# Patient Record
Sex: Female | Born: 1965 | Race: White | Hispanic: No | Smoking: Current every day smoker
Health system: Southern US, Community
[De-identification: ages and names within clinical notes are randomized; demographics above are authoritative.]

## PROBLEM LIST (undated history)

## (undated) DIAGNOSIS — G4733 Obstructive sleep apnea (adult) (pediatric): Secondary | ICD-10-CM

## (undated) DIAGNOSIS — K760 Fatty (change of) liver, not elsewhere classified: Secondary | ICD-10-CM

## (undated) DIAGNOSIS — M1990 Unspecified osteoarthritis, unspecified site: Secondary | ICD-10-CM

## (undated) DIAGNOSIS — I10 Essential (primary) hypertension: Secondary | ICD-10-CM

## (undated) DIAGNOSIS — I509 Heart failure, unspecified: Secondary | ICD-10-CM

## (undated) DIAGNOSIS — E119 Type 2 diabetes mellitus without complications: Secondary | ICD-10-CM

## (undated) DIAGNOSIS — K859 Acute pancreatitis without necrosis or infection, unspecified: Secondary | ICD-10-CM

## (undated) DIAGNOSIS — I5030 Unspecified diastolic (congestive) heart failure: Secondary | ICD-10-CM

## (undated) DIAGNOSIS — K259 Gastric ulcer, unspecified as acute or chronic, without hemorrhage or perforation: Secondary | ICD-10-CM

## (undated) DIAGNOSIS — K8020 Calculus of gallbladder without cholecystitis without obstruction: Secondary | ICD-10-CM

## (undated) DIAGNOSIS — J45909 Unspecified asthma, uncomplicated: Secondary | ICD-10-CM

## (undated) DIAGNOSIS — S4990XA Unspecified injury of shoulder and upper arm, unspecified arm, initial encounter: Secondary | ICD-10-CM

## (undated) DIAGNOSIS — G510 Bell's palsy: Secondary | ICD-10-CM

## (undated) DIAGNOSIS — E785 Hyperlipidemia, unspecified: Secondary | ICD-10-CM

## (undated) DIAGNOSIS — J449 Chronic obstructive pulmonary disease, unspecified: Secondary | ICD-10-CM

## (undated) DIAGNOSIS — F17200 Nicotine dependence, unspecified, uncomplicated: Secondary | ICD-10-CM

## (undated) DIAGNOSIS — F419 Anxiety disorder, unspecified: Secondary | ICD-10-CM

## (undated) DIAGNOSIS — R0600 Dyspnea, unspecified: Secondary | ICD-10-CM

## (undated) DIAGNOSIS — J40 Bronchitis, not specified as acute or chronic: Secondary | ICD-10-CM

## (undated) DIAGNOSIS — F32A Depression, unspecified: Secondary | ICD-10-CM

## (undated) HISTORY — PX: KNEE SURGERY: SHX244

## (undated) HISTORY — DX: Heart failure, unspecified: I50.9

## (undated) HISTORY — DX: Bell's palsy: G51.0

## (undated) HISTORY — DX: Hyperlipidemia, unspecified: E78.5

## (undated) HISTORY — DX: Type 2 diabetes mellitus without complications: E11.9

## (undated) HISTORY — DX: Acute pancreatitis without necrosis or infection, unspecified: K85.90

## (undated) HISTORY — DX: Unspecified injury of shoulder and upper arm, unspecified arm, initial encounter: S49.90XA

## (undated) HISTORY — DX: Unspecified diastolic (congestive) heart failure: I50.30

## (undated) HISTORY — DX: Obstructive sleep apnea (adult) (pediatric): G47.33

## (undated) HISTORY — DX: Bronchitis, not specified as acute or chronic: J40

---
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
## 2004-08-06 ENCOUNTER — Ambulatory Visit: Payer: Self-pay | Admitting: Specialist

## 2004-08-20 ENCOUNTER — Ambulatory Visit: Payer: Self-pay | Admitting: Specialist

## 2007-02-06 ENCOUNTER — Emergency Department: Payer: Self-pay | Admitting: Emergency Medicine

## 2008-10-30 ENCOUNTER — Ambulatory Visit: Payer: Self-pay | Admitting: Internal Medicine

## 2008-11-11 ENCOUNTER — Ambulatory Visit: Payer: Self-pay | Admitting: Rheumatology

## 2008-11-13 ENCOUNTER — Ambulatory Visit: Payer: Self-pay | Admitting: Internal Medicine

## 2008-11-24 ENCOUNTER — Emergency Department: Payer: Self-pay | Admitting: Emergency Medicine

## 2008-12-14 ENCOUNTER — Emergency Department: Payer: Self-pay

## 2009-01-02 ENCOUNTER — Ambulatory Visit: Payer: Self-pay | Admitting: Specialist

## 2009-02-03 ENCOUNTER — Ambulatory Visit: Payer: Self-pay | Admitting: Pain Medicine

## 2009-02-11 ENCOUNTER — Ambulatory Visit: Payer: Self-pay | Admitting: Pain Medicine

## 2009-02-26 ENCOUNTER — Ambulatory Visit: Payer: Self-pay | Admitting: Physician Assistant

## 2009-04-10 ENCOUNTER — Ambulatory Visit: Payer: Self-pay | Admitting: Physician Assistant

## 2009-04-22 ENCOUNTER — Ambulatory Visit: Payer: Self-pay | Admitting: Pain Medicine

## 2009-05-13 ENCOUNTER — Ambulatory Visit: Payer: Self-pay | Admitting: Physician Assistant

## 2009-05-22 ENCOUNTER — Ambulatory Visit: Payer: Self-pay | Admitting: Otolaryngology

## 2009-06-10 ENCOUNTER — Ambulatory Visit: Payer: Self-pay | Admitting: Physician Assistant

## 2009-06-17 ENCOUNTER — Ambulatory Visit: Payer: Self-pay | Admitting: Pain Medicine

## 2009-07-08 ENCOUNTER — Ambulatory Visit: Payer: Self-pay | Admitting: Physician Assistant

## 2009-08-12 ENCOUNTER — Ambulatory Visit: Payer: Self-pay | Admitting: Pain Medicine

## 2009-09-02 ENCOUNTER — Ambulatory Visit: Payer: Self-pay | Admitting: Pain Medicine

## 2009-10-23 ENCOUNTER — Ambulatory Visit: Payer: Self-pay | Admitting: Pain Medicine

## 2009-11-24 ENCOUNTER — Ambulatory Visit: Payer: Self-pay | Admitting: Pain Medicine

## 2011-04-17 ENCOUNTER — Emergency Department: Payer: Self-pay | Admitting: Internal Medicine

## 2011-09-01 DIAGNOSIS — L719 Rosacea, unspecified: Secondary | ICD-10-CM | POA: Insufficient documentation

## 2012-02-08 ENCOUNTER — Emergency Department: Payer: Self-pay | Admitting: *Deleted

## 2012-07-07 ENCOUNTER — Emergency Department: Payer: Self-pay | Admitting: Emergency Medicine

## 2012-07-07 LAB — COMPREHENSIVE METABOLIC PANEL
Alkaline Phosphatase: 102 U/L (ref 50–136)
Anion Gap: 7 (ref 7–16)
BUN: 6 mg/dL — ABNORMAL LOW (ref 7–18)
Bilirubin,Total: 0.4 mg/dL (ref 0.2–1.0)
Calcium, Total: 8.8 mg/dL (ref 8.5–10.1)
Chloride: 103 mmol/L (ref 98–107)
Co2: 26 mmol/L (ref 21–32)
EGFR (African American): >60
EGFR (Non-African Amer.): >60
Glucose: 125 mg/dL — ABNORMAL HIGH (ref 65–99)
Potassium: 3.8 mmol/L (ref 3.5–5.1)
SGOT(AST): 79 U/L — ABNORMAL HIGH (ref 15–37)
SGPT (ALT): 76 U/L (ref 12–78)
Total Protein: 7.7 g/dL (ref 6.4–8.2)

## 2012-07-07 LAB — CBC
MCH: 34.4 pg — ABNORMAL HIGH (ref 26.0–34.0)
MCHC: 34.5 g/dL (ref 32.0–36.0)
RBC: 4.69 10*6/uL (ref 3.80–5.20)
RDW: 15 % — ABNORMAL HIGH (ref 11.5–14.5)

## 2012-12-19 ENCOUNTER — Emergency Department: Payer: Self-pay | Admitting: Emergency Medicine

## 2012-12-19 LAB — BASIC METABOLIC PANEL
BUN: 7 mg/dL (ref 7–18)
Calcium, Total: 8.8 mg/dL (ref 8.5–10.1)
Co2: 25 mmol/L (ref 21–32)
Creatinine: 0.73 mg/dL (ref 0.60–1.30)
EGFR (African American): >60
Glucose: 128 mg/dL — ABNORMAL HIGH (ref 65–99)
Potassium: 3.6 mmol/L (ref 3.5–5.1)

## 2012-12-19 LAB — CBC
MCH: 35.7 pg — ABNORMAL HIGH (ref 26.0–34.0)
MCHC: 34 g/dL (ref 32.0–36.0)
MCV: 105 fL — ABNORMAL HIGH (ref 80–100)
RBC: 4.17 10*6/uL (ref 3.80–5.20)
RDW: 14.7 % — ABNORMAL HIGH (ref 11.5–14.5)
WBC: 8.7 10*3/uL (ref 3.6–11.0)

## 2012-12-19 LAB — TROPONIN I: Troponin-I: <0.02 ng/mL

## 2013-02-10 ENCOUNTER — Emergency Department: Payer: Self-pay | Admitting: Emergency Medicine

## 2013-02-10 LAB — CK TOTAL AND CKMB (NOT AT ARMC): CK-MB: 1.8 ng/mL (ref 0.5–3.6)

## 2013-02-10 LAB — COMPREHENSIVE METABOLIC PANEL
Alkaline Phosphatase: 84 U/L (ref 50–136)
Anion Gap: 8 (ref 7–16)
Bilirubin,Total: 0.7 mg/dL (ref 0.2–1.0)
Calcium, Total: 8.9 mg/dL (ref 8.5–10.1)
Chloride: 101 mmol/L (ref 98–107)
Co2: 26 mmol/L (ref 21–32)
EGFR (Non-African Amer.): >60
Glucose: 111 mg/dL — ABNORMAL HIGH (ref 65–99)
Potassium: 3.4 mmol/L — ABNORMAL LOW (ref 3.5–5.1)
SGOT(AST): 66 U/L — ABNORMAL HIGH (ref 15–37)
SGPT (ALT): 70 U/L (ref 12–78)
Sodium: 135 mmol/L — ABNORMAL LOW (ref 136–145)
Total Protein: 7 g/dL (ref 6.4–8.2)

## 2013-02-10 LAB — CBC
HCT: 43.4 % (ref 35.0–47.0)
HGB: 14.7 g/dL (ref 12.0–16.0)
MCH: 34.9 pg — ABNORMAL HIGH (ref 26.0–34.0)
MCHC: 33.9 g/dL (ref 32.0–36.0)
Platelet: 285 10*3/uL (ref 150–440)
RBC: 4.22 10*6/uL (ref 3.80–5.20)
WBC: 15.3 10*3/uL — ABNORMAL HIGH (ref 3.6–11.0)

## 2013-02-10 LAB — TROPONIN I: Troponin-I: <0.02 ng/mL

## 2013-02-10 LAB — LIPASE, BLOOD: Lipase: 90 U/L (ref 73–393)

## 2013-02-16 ENCOUNTER — Emergency Department: Payer: Self-pay | Admitting: Unknown Physician Specialty

## 2013-02-16 LAB — COMPREHENSIVE METABOLIC PANEL
Albumin: 3.3 g/dL — ABNORMAL LOW (ref 3.4–5.0)
Bilirubin,Total: 1 mg/dL (ref 0.2–1.0)
Calcium, Total: 8.7 mg/dL (ref 8.5–10.1)
Chloride: 100 mmol/L (ref 98–107)
EGFR (African American): >60
EGFR (Non-African Amer.): >60
Glucose: 109 mg/dL — ABNORMAL HIGH (ref 65–99)
Potassium: 3 mmol/L — ABNORMAL LOW (ref 3.5–5.1)
Sodium: 135 mmol/L — ABNORMAL LOW (ref 136–145)

## 2013-02-16 LAB — CBC
HCT: 42.5 % (ref 35.0–47.0)
MCHC: 35.6 g/dL (ref 32.0–36.0)
Platelet: 278 10*3/uL (ref 150–440)
RBC: 4.18 10*6/uL (ref 3.80–5.20)

## 2013-03-10 ENCOUNTER — Emergency Department: Payer: Self-pay | Admitting: Emergency Medicine

## 2013-03-10 LAB — COMPREHENSIVE METABOLIC PANEL
Bilirubin,Total: 0.7 mg/dL (ref 0.2–1.0)
Chloride: 99 mmol/L (ref 98–107)
Co2: 27 mmol/L (ref 21–32)
Creatinine: 0.75 mg/dL (ref 0.60–1.30)
EGFR (African American): >60
Osmolality: 267 (ref 275–301)
Potassium: 3.4 mmol/L — ABNORMAL LOW (ref 3.5–5.1)
SGOT(AST): 57 U/L — ABNORMAL HIGH (ref 15–37)
SGPT (ALT): 57 U/L (ref 12–78)

## 2013-03-10 LAB — CBC
HCT: 44.2 % (ref 35.0–47.0)
HGB: 15.4 g/dL (ref 12.0–16.0)
MCH: 35.8 pg — ABNORMAL HIGH (ref 26.0–34.0)
MCHC: 34.8 g/dL (ref 32.0–36.0)
MCV: 103 fL — ABNORMAL HIGH (ref 80–100)
WBC: 9.3 10*3/uL (ref 3.6–11.0)

## 2013-03-10 LAB — CK TOTAL AND CKMB (NOT AT ARMC): CK, Total: 56 U/L (ref 21–215)

## 2013-03-10 LAB — TROPONIN I: Troponin-I: <0.02 ng/mL

## 2013-03-23 ENCOUNTER — Emergency Department: Payer: Self-pay | Admitting: Internal Medicine

## 2013-03-23 LAB — CBC
HCT: 43.6 % (ref 35.0–47.0)
HGB: 15.3 g/dL (ref 12.0–16.0)
MCH: 35.6 pg — ABNORMAL HIGH (ref 26.0–34.0)
MCHC: 35 g/dL (ref 32.0–36.0)
MCV: 102 fL — ABNORMAL HIGH (ref 80–100)
Platelet: 258 10*3/uL (ref 150–440)
RBC: 4.29 10*6/uL (ref 3.80–5.20)
RDW: 13.4 % (ref 11.5–14.5)
WBC: 9 10*3/uL (ref 3.6–11.0)

## 2013-03-23 LAB — BASIC METABOLIC PANEL
Anion Gap: 5 — ABNORMAL LOW (ref 7–16)
BUN: 5 mg/dL — ABNORMAL LOW (ref 7–18)
Calcium, Total: 9.2 mg/dL (ref 8.5–10.1)
Chloride: 102 mmol/L (ref 98–107)
Co2: 28 mmol/L (ref 21–32)
Creatinine: 0.52 mg/dL — ABNORMAL LOW (ref 0.60–1.30)
EGFR (African American): >60
EGFR (Non-African Amer.): >60
Glucose: 98 mg/dL (ref 65–99)
Osmolality: 267 (ref 275–301)
Potassium: 3.5 mmol/L (ref 3.5–5.1)
Sodium: 135 mmol/L — ABNORMAL LOW (ref 136–145)

## 2013-03-23 LAB — PRO B NATRIURETIC PEPTIDE: B-Type Natriuretic Peptide: 70 pg/mL (ref 0–125)

## 2014-02-26 ENCOUNTER — Emergency Department: Payer: Self-pay | Admitting: Emergency Medicine

## 2014-02-26 LAB — BASIC METABOLIC PANEL
Anion Gap: 8 (ref 7–16)
BUN: 4 mg/dL — ABNORMAL LOW (ref 7–18)
CHLORIDE: 104 mmol/L (ref 98–107)
CREATININE: 0.74 mg/dL (ref 0.60–1.30)
Calcium, Total: 8.3 mg/dL — ABNORMAL LOW (ref 8.5–10.1)
Co2: 27 mmol/L (ref 21–32)
EGFR (Non-African Amer.): >60
GLUCOSE: 132 mg/dL — AB (ref 65–99)
OSMOLALITY: 276 (ref 275–301)
Potassium: 3.7 mmol/L (ref 3.5–5.1)
SODIUM: 139 mmol/L (ref 136–145)

## 2014-02-26 LAB — URINALYSIS, COMPLETE
BILIRUBIN, UR: NEGATIVE
Bacteria: NONE SEEN
Blood: NEGATIVE
Glucose,UR: NEGATIVE mg/dL (ref 0–75)
Ketone: NEGATIVE
Nitrite: NEGATIVE
PH: 6 (ref 4.5–8.0)
PROTEIN: NEGATIVE
RBC,UR: NONE SEEN /HPF (ref 0–5)
SPECIFIC GRAVITY: 1.014 (ref 1.003–1.030)
Squamous Epithelial: 1
WBC UR: 3 /HPF (ref 0–5)

## 2014-02-26 LAB — TROPONIN I: Troponin-I: <0.02 ng/mL

## 2014-02-26 LAB — CBC
HCT: 45.5 % (ref 35.0–47.0)
HGB: 14.9 g/dL (ref 12.0–16.0)
MCH: 33.3 pg (ref 26.0–34.0)
MCHC: 32.8 g/dL (ref 32.0–36.0)
MCV: 102 fL — ABNORMAL HIGH (ref 80–100)
PLATELETS: 281 10*3/uL (ref 150–440)
RBC: 4.48 10*6/uL (ref 3.80–5.20)
RDW: 14.3 % (ref 11.5–14.5)
WBC: 9.7 10*3/uL (ref 3.6–11.0)

## 2014-03-14 ENCOUNTER — Emergency Department: Payer: Self-pay | Admitting: Emergency Medicine

## 2014-04-17 ENCOUNTER — Ambulatory Visit: Payer: Self-pay | Admitting: Specialist

## 2015-06-13 ENCOUNTER — Inpatient Hospital Stay
Admission: EM | Admit: 2015-06-13 | Discharge: 2015-06-23 | DRG: 643 | Disposition: A | Discharge status: Home / Self Care | Payer: 59 | Attending: Internal Medicine | Admitting: Internal Medicine

## 2015-06-13 ENCOUNTER — Encounter: Payer: Self-pay | Admitting: Emergency Medicine

## 2015-06-13 ENCOUNTER — Emergency Department: Payer: 59

## 2015-06-13 DIAGNOSIS — J440 Chronic obstructive pulmonary disease with acute lower respiratory infection: Secondary | ICD-10-CM | POA: Diagnosis present

## 2015-06-13 DIAGNOSIS — J45909 Unspecified asthma, uncomplicated: Secondary | ICD-10-CM | POA: Diagnosis present

## 2015-06-13 DIAGNOSIS — R0602 Shortness of breath: Secondary | ICD-10-CM | POA: Insufficient documentation

## 2015-06-13 DIAGNOSIS — N289 Disorder of kidney and ureter, unspecified: Secondary | ICD-10-CM | POA: Insufficient documentation

## 2015-06-13 DIAGNOSIS — E878 Other disorders of electrolyte and fluid balance, not elsewhere classified: Secondary | ICD-10-CM | POA: Diagnosis present

## 2015-06-13 DIAGNOSIS — J449 Chronic obstructive pulmonary disease, unspecified: Secondary | ICD-10-CM

## 2015-06-13 DIAGNOSIS — Z833 Family history of diabetes mellitus: Secondary | ICD-10-CM

## 2015-06-13 DIAGNOSIS — R059 Cough, unspecified: Secondary | ICD-10-CM | POA: Insufficient documentation

## 2015-06-13 DIAGNOSIS — E871 Hypo-osmolality and hyponatremia: Secondary | ICD-10-CM | POA: Diagnosis not present

## 2015-06-13 DIAGNOSIS — E222 Syndrome of inappropriate secretion of antidiuretic hormone: Secondary | ICD-10-CM | POA: Diagnosis not present

## 2015-06-13 DIAGNOSIS — J159 Unspecified bacterial pneumonia: Secondary | ICD-10-CM | POA: Diagnosis present

## 2015-06-13 DIAGNOSIS — B379 Candidiasis, unspecified: Secondary | ICD-10-CM | POA: Diagnosis present

## 2015-06-13 DIAGNOSIS — R079 Chest pain, unspecified: Secondary | ICD-10-CM | POA: Diagnosis present

## 2015-06-13 DIAGNOSIS — N3001 Acute cystitis with hematuria: Secondary | ICD-10-CM | POA: Diagnosis present

## 2015-06-13 DIAGNOSIS — E8770 Fluid overload, unspecified: Secondary | ICD-10-CM | POA: Diagnosis present

## 2015-06-13 DIAGNOSIS — R609 Edema, unspecified: Secondary | ICD-10-CM | POA: Insufficient documentation

## 2015-06-13 DIAGNOSIS — G4733 Obstructive sleep apnea (adult) (pediatric): Secondary | ICD-10-CM | POA: Diagnosis present

## 2015-06-13 DIAGNOSIS — M545 Low back pain: Secondary | ICD-10-CM | POA: Diagnosis present

## 2015-06-13 DIAGNOSIS — J189 Pneumonia, unspecified organism: Secondary | ICD-10-CM | POA: Insufficient documentation

## 2015-06-13 DIAGNOSIS — J209 Acute bronchitis, unspecified: Secondary | ICD-10-CM | POA: Insufficient documentation

## 2015-06-13 DIAGNOSIS — E876 Hypokalemia: Secondary | ICD-10-CM | POA: Diagnosis present

## 2015-06-13 DIAGNOSIS — R0600 Dyspnea, unspecified: Secondary | ICD-10-CM | POA: Insufficient documentation

## 2015-06-13 DIAGNOSIS — E869 Volume depletion, unspecified: Secondary | ICD-10-CM | POA: Diagnosis present

## 2015-06-13 DIAGNOSIS — F419 Anxiety disorder, unspecified: Secondary | ICD-10-CM | POA: Diagnosis present

## 2015-06-13 DIAGNOSIS — Z8711 Personal history of peptic ulcer disease: Secondary | ICD-10-CM

## 2015-06-13 DIAGNOSIS — E1165 Type 2 diabetes mellitus with hyperglycemia: Secondary | ICD-10-CM | POA: Diagnosis present

## 2015-06-13 DIAGNOSIS — Z803 Family history of malignant neoplasm of breast: Secondary | ICD-10-CM

## 2015-06-13 DIAGNOSIS — Z7982 Long term (current) use of aspirin: Secondary | ICD-10-CM

## 2015-06-13 DIAGNOSIS — R600 Localized edema: Secondary | ICD-10-CM | POA: Diagnosis present

## 2015-06-13 DIAGNOSIS — I272 Other secondary pulmonary hypertension: Secondary | ICD-10-CM | POA: Diagnosis present

## 2015-06-13 DIAGNOSIS — I5033 Acute on chronic diastolic (congestive) heart failure: Secondary | ICD-10-CM | POA: Insufficient documentation

## 2015-06-13 DIAGNOSIS — E872 Acidosis: Secondary | ICD-10-CM | POA: Diagnosis present

## 2015-06-13 DIAGNOSIS — J80 Acute respiratory distress syndrome: Secondary | ICD-10-CM | POA: Diagnosis present

## 2015-06-13 DIAGNOSIS — B370 Candidal stomatitis: Secondary | ICD-10-CM | POA: Diagnosis present

## 2015-06-13 DIAGNOSIS — Z9114 Patient's other noncompliance with medication regimen: Secondary | ICD-10-CM

## 2015-06-13 DIAGNOSIS — I110 Hypertensive heart disease with heart failure: Secondary | ICD-10-CM | POA: Diagnosis present

## 2015-06-13 DIAGNOSIS — Z801 Family history of malignant neoplasm of trachea, bronchus and lung: Secondary | ICD-10-CM

## 2015-06-13 DIAGNOSIS — R05 Cough: Secondary | ICD-10-CM | POA: Insufficient documentation

## 2015-06-13 DIAGNOSIS — R911 Solitary pulmonary nodule: Secondary | ICD-10-CM | POA: Diagnosis present

## 2015-06-13 DIAGNOSIS — J441 Chronic obstructive pulmonary disease with (acute) exacerbation: Secondary | ICD-10-CM | POA: Diagnosis present

## 2015-06-13 DIAGNOSIS — Z95828 Presence of other vascular implants and grafts: Secondary | ICD-10-CM

## 2015-06-13 DIAGNOSIS — M1990 Unspecified osteoarthritis, unspecified site: Secondary | ICD-10-CM | POA: Diagnosis present

## 2015-06-13 DIAGNOSIS — F17210 Nicotine dependence, cigarettes, uncomplicated: Secondary | ICD-10-CM | POA: Diagnosis present

## 2015-06-13 DIAGNOSIS — I200 Unstable angina: Secondary | ICD-10-CM | POA: Diagnosis present

## 2015-06-13 HISTORY — DX: Unspecified osteoarthritis, unspecified site: M19.90

## 2015-06-13 HISTORY — DX: Essential (primary) hypertension: I10

## 2015-06-13 HISTORY — DX: Gastric ulcer, unspecified as acute or chronic, without hemorrhage or perforation: K25.9

## 2015-06-13 HISTORY — DX: Unspecified asthma, uncomplicated: J45.909

## 2015-06-13 HISTORY — DX: Chronic obstructive pulmonary disease, unspecified: J44.9

## 2015-06-13 LAB — CBC
HCT: 32.7 % — ABNORMAL LOW (ref 35.0–47.0)
Hemoglobin: 11.5 g/dL — ABNORMAL LOW (ref 12.0–16.0)
MCH: 39.2 pg — ABNORMAL HIGH (ref 26.0–34.0)
MCHC: 35.1 g/dL (ref 32.0–36.0)
MCV: 111.6 fL — ABNORMAL HIGH (ref 80.0–100.0)
PLATELETS: 304 10*3/uL (ref 150–440)
RBC: 2.93 MIL/uL — ABNORMAL LOW (ref 3.80–5.20)
RDW: 19.2 % — AB (ref 11.5–14.5)
WBC: 14.8 10*3/uL — ABNORMAL HIGH (ref 3.6–11.0)

## 2015-06-13 MED ORDER — IPRATROPIUM-ALBUTEROL 0.5-2.5 (3) MG/3ML IN SOLN
3.0000 mL | Freq: Once | RESPIRATORY_TRACT | Status: AC
Start: 2015-06-13 — End: 2015-06-13
  Administered 2015-06-13: 3 mL via RESPIRATORY_TRACT
  Filled 2015-06-13: qty 3

## 2015-06-13 MED ORDER — IPRATROPIUM-ALBUTEROL 0.5-2.5 (3) MG/3ML IN SOLN
3.0000 mL | Freq: Once | RESPIRATORY_TRACT | Status: AC
  Administered 2015-06-13: 3 mL via RESPIRATORY_TRACT
  Filled 2015-06-13: qty 3

## 2015-06-13 NOTE — ED Notes (Signed)
Patient transported to X-ray 

## 2015-06-13 NOTE — ED Notes (Signed)
Patient transported to x-ray. ?

## 2015-06-13 NOTE — ED Notes (Signed)
Pt reports some improvement after duoneb.  Lung sounds clear

## 2015-06-13 NOTE — ED Notes (Addendum)
Patient with complaint of shortness of breath times one week. Patient states that it has become worse today. Patient with a history of asthma and copd. Patient with swelling to bilateral feet and legs. Patient with labored breathing.

## 2015-06-14 ENCOUNTER — Encounter: Payer: Self-pay | Admitting: Internal Medicine

## 2015-06-14 ENCOUNTER — Inpatient Hospital Stay: Payer: 59

## 2015-06-14 ENCOUNTER — Inpatient Hospital Stay (HOSPITAL_COMMUNITY)
Admit: 2015-06-14 | Discharge: 2015-06-14 | Disposition: A | Discharge status: Home / Self Care | Payer: 59 | Attending: Internal Medicine | Admitting: Internal Medicine

## 2015-06-14 DIAGNOSIS — R600 Localized edema: Secondary | ICD-10-CM | POA: Diagnosis present

## 2015-06-14 DIAGNOSIS — M1990 Unspecified osteoarthritis, unspecified site: Secondary | ICD-10-CM | POA: Diagnosis present

## 2015-06-14 DIAGNOSIS — J441 Chronic obstructive pulmonary disease with (acute) exacerbation: Secondary | ICD-10-CM

## 2015-06-14 DIAGNOSIS — Z801 Family history of malignant neoplasm of trachea, bronchus and lung: Secondary | ICD-10-CM | POA: Diagnosis not present

## 2015-06-14 DIAGNOSIS — J189 Pneumonia, unspecified organism: Secondary | ICD-10-CM

## 2015-06-14 DIAGNOSIS — R079 Chest pain, unspecified: Secondary | ICD-10-CM | POA: Diagnosis not present

## 2015-06-14 DIAGNOSIS — E878 Other disorders of electrolyte and fluid balance, not elsewhere classified: Secondary | ICD-10-CM | POA: Diagnosis present

## 2015-06-14 DIAGNOSIS — J80 Acute respiratory distress syndrome: Secondary | ICD-10-CM | POA: Diagnosis present

## 2015-06-14 DIAGNOSIS — R609 Edema, unspecified: Secondary | ICD-10-CM | POA: Insufficient documentation

## 2015-06-14 DIAGNOSIS — E871 Hypo-osmolality and hyponatremia: Secondary | ICD-10-CM

## 2015-06-14 DIAGNOSIS — E222 Syndrome of inappropriate secretion of antidiuretic hormone: Secondary | ICD-10-CM | POA: Diagnosis present

## 2015-06-14 DIAGNOSIS — E869 Volume depletion, unspecified: Secondary | ICD-10-CM | POA: Diagnosis present

## 2015-06-14 DIAGNOSIS — R05 Cough: Secondary | ICD-10-CM | POA: Diagnosis not present

## 2015-06-14 DIAGNOSIS — R0600 Dyspnea, unspecified: Secondary | ICD-10-CM | POA: Diagnosis not present

## 2015-06-14 DIAGNOSIS — I200 Unstable angina: Secondary | ICD-10-CM | POA: Diagnosis present

## 2015-06-14 DIAGNOSIS — E1165 Type 2 diabetes mellitus with hyperglycemia: Secondary | ICD-10-CM | POA: Diagnosis present

## 2015-06-14 DIAGNOSIS — F17210 Nicotine dependence, cigarettes, uncomplicated: Secondary | ICD-10-CM | POA: Diagnosis present

## 2015-06-14 DIAGNOSIS — J159 Unspecified bacterial pneumonia: Secondary | ICD-10-CM | POA: Diagnosis present

## 2015-06-14 DIAGNOSIS — J209 Acute bronchitis, unspecified: Secondary | ICD-10-CM | POA: Insufficient documentation

## 2015-06-14 DIAGNOSIS — J440 Chronic obstructive pulmonary disease with acute lower respiratory infection: Secondary | ICD-10-CM | POA: Diagnosis present

## 2015-06-14 DIAGNOSIS — B370 Candidal stomatitis: Secondary | ICD-10-CM | POA: Diagnosis present

## 2015-06-14 DIAGNOSIS — Z9114 Patient's other noncompliance with medication regimen: Secondary | ICD-10-CM | POA: Diagnosis not present

## 2015-06-14 DIAGNOSIS — R059 Cough, unspecified: Secondary | ICD-10-CM | POA: Insufficient documentation

## 2015-06-14 DIAGNOSIS — R0602 Shortness of breath: Secondary | ICD-10-CM | POA: Diagnosis not present

## 2015-06-14 DIAGNOSIS — E8770 Fluid overload, unspecified: Secondary | ICD-10-CM | POA: Diagnosis present

## 2015-06-14 DIAGNOSIS — Z8711 Personal history of peptic ulcer disease: Secondary | ICD-10-CM | POA: Diagnosis not present

## 2015-06-14 DIAGNOSIS — J45909 Unspecified asthma, uncomplicated: Secondary | ICD-10-CM | POA: Diagnosis present

## 2015-06-14 DIAGNOSIS — I272 Other secondary pulmonary hypertension: Secondary | ICD-10-CM | POA: Diagnosis present

## 2015-06-14 DIAGNOSIS — Z803 Family history of malignant neoplasm of breast: Secondary | ICD-10-CM | POA: Diagnosis not present

## 2015-06-14 DIAGNOSIS — N289 Disorder of kidney and ureter, unspecified: Secondary | ICD-10-CM | POA: Diagnosis present

## 2015-06-14 DIAGNOSIS — N3001 Acute cystitis with hematuria: Secondary | ICD-10-CM | POA: Diagnosis present

## 2015-06-14 DIAGNOSIS — I5033 Acute on chronic diastolic (congestive) heart failure: Secondary | ICD-10-CM | POA: Diagnosis present

## 2015-06-14 DIAGNOSIS — G4733 Obstructive sleep apnea (adult) (pediatric): Secondary | ICD-10-CM | POA: Diagnosis present

## 2015-06-14 DIAGNOSIS — B379 Candidiasis, unspecified: Secondary | ICD-10-CM | POA: Diagnosis present

## 2015-06-14 DIAGNOSIS — R918 Other nonspecific abnormal finding of lung field: Secondary | ICD-10-CM | POA: Diagnosis not present

## 2015-06-14 DIAGNOSIS — J9621 Acute and chronic respiratory failure with hypoxia: Secondary | ICD-10-CM | POA: Diagnosis not present

## 2015-06-14 DIAGNOSIS — I110 Hypertensive heart disease with heart failure: Secondary | ICD-10-CM | POA: Diagnosis present

## 2015-06-14 DIAGNOSIS — E876 Hypokalemia: Secondary | ICD-10-CM | POA: Diagnosis present

## 2015-06-14 DIAGNOSIS — E872 Acidosis: Secondary | ICD-10-CM | POA: Diagnosis present

## 2015-06-14 DIAGNOSIS — Z833 Family history of diabetes mellitus: Secondary | ICD-10-CM | POA: Diagnosis not present

## 2015-06-14 DIAGNOSIS — F419 Anxiety disorder, unspecified: Secondary | ICD-10-CM | POA: Diagnosis present

## 2015-06-14 DIAGNOSIS — Z7982 Long term (current) use of aspirin: Secondary | ICD-10-CM | POA: Diagnosis not present

## 2015-06-14 DIAGNOSIS — R911 Solitary pulmonary nodule: Secondary | ICD-10-CM | POA: Diagnosis present

## 2015-06-14 DIAGNOSIS — M545 Low back pain: Secondary | ICD-10-CM | POA: Diagnosis present

## 2015-06-14 LAB — BASIC METABOLIC PANEL
ANION GAP: 12 (ref 5–15)
Anion gap: 13 (ref 5–15)
Anion gap: 11 (ref 5–15)
Anion gap: 11 (ref 5–15)
Anion gap: 10 (ref 5–15)
Anion gap: 11 (ref 5–15)
BUN: 20 mg/dL (ref 6–20)
BUN: 19 mg/dL (ref 6–20)
BUN: 19 mg/dL (ref 6–20)
BUN: 20 mg/dL (ref 6–20)
BUN: 21 mg/dL — AB (ref 6–20)
BUN: 19 mg/dL (ref 6–20)
CALCIUM: 8.1 mg/dL — AB (ref 8.9–10.3)
CALCIUM: 7.9 mg/dL — AB (ref 8.9–10.3)
CHLORIDE: 80 mmol/L — AB (ref 101–111)
CHLORIDE: 82 mmol/L — AB (ref 101–111)
CHLORIDE: 83 mmol/L — AB (ref 101–111)
CHLORIDE: 86 mmol/L — AB (ref 101–111)
CO2: 19 mmol/L — AB (ref 22–32)
CO2: 20 mmol/L — ABNORMAL LOW (ref 22–32)
CO2: 21 mmol/L — AB (ref 22–32)
CO2: 21 mmol/L — AB (ref 22–32)
CO2: 22 mmol/L (ref 22–32)
CO2: 18 mmol/L — ABNORMAL LOW (ref 22–32)
CREATININE: 0.98 mg/dL (ref 0.44–1.00)
CREATININE: 1.05 mg/dL — AB (ref 0.44–1.00)
Calcium: 8.2 mg/dL — ABNORMAL LOW (ref 8.9–10.3)
Calcium: 8 mg/dL — ABNORMAL LOW (ref 8.9–10.3)
Calcium: 7.9 mg/dL — ABNORMAL LOW (ref 8.9–10.3)
Calcium: 7.9 mg/dL — ABNORMAL LOW (ref 8.9–10.3)
Chloride: 82 mmol/L — ABNORMAL LOW (ref 101–111)
Chloride: 80 mmol/L — ABNORMAL LOW (ref 101–111)
Creatinine, Ser: 0.89 mg/dL (ref 0.44–1.00)
Creatinine, Ser: 0.96 mg/dL (ref 0.44–1.00)
Creatinine, Ser: 1.07 mg/dL — ABNORMAL HIGH (ref 0.44–1.00)
Creatinine, Ser: 1.1 mg/dL — ABNORMAL HIGH (ref 0.44–1.00)
GFR calc Af Amer: >60 mL/min (ref >60)
GFR calc Af Amer: >60 mL/min (ref >60)
GFR calc Af Amer: >60 mL/min (ref >60)
GFR calc Af Amer: >60 mL/min (ref >60)
GFR calc Af Amer: >60 mL/min (ref >60)
GFR calc non Af Amer: >60 mL/min (ref >60)
GFR calc non Af Amer: >60 mL/min (ref >60)
GFR calc non Af Amer: >60 mL/min (ref >60)
GFR calc non Af Amer: 58 mL/min — ABNORMAL LOW (ref >60)
GFR, EST NON AFRICAN AMERICAN: 59 mL/min — AB (ref >60)
GLUCOSE: 148 mg/dL — AB (ref 65–99)
GLUCOSE: 143 mg/dL — AB (ref 65–99)
GLUCOSE: 144 mg/dL — AB (ref 65–99)
GLUCOSE: 117 mg/dL — AB (ref 65–99)
GLUCOSE: 201 mg/dL — AB (ref 65–99)
Glucose, Bld: 134 mg/dL — ABNORMAL HIGH (ref 65–99)
POTASSIUM: 4.5 mmol/L (ref 3.5–5.1)
POTASSIUM: 4.5 mmol/L (ref 3.5–5.1)
POTASSIUM: 4.1 mmol/L (ref 3.5–5.1)
POTASSIUM: 4.3 mmol/L (ref 3.5–5.1)
Potassium: 3.8 mmol/L (ref 3.5–5.1)
Potassium: 4 mmol/L (ref 3.5–5.1)
SODIUM: 114 mmol/L — AB (ref 135–145)
SODIUM: 114 mmol/L — AB (ref 135–145)
Sodium: 112 mmol/L — CL (ref 135–145)
Sodium: 112 mmol/L — CL (ref 135–145)
Sodium: 115 mmol/L — CL (ref 135–145)
Sodium: 115 mmol/L — CL (ref 135–145)

## 2015-06-14 LAB — TROPONIN I
Troponin I: 0.06 ng/mL — ABNORMAL HIGH (ref <0.031)
Troponin I: <0.03 ng/mL (ref <0.031)

## 2015-06-14 LAB — LIPID PANEL
Cholesterol: 177 mg/dL (ref 0–200)
HDL: 25 mg/dL — ABNORMAL LOW (ref >40)
LDL CALC: 113 mg/dL — AB (ref 0–99)
Total CHOL/HDL Ratio: 7.1 RATIO
Triglycerides: 193 mg/dL — ABNORMAL HIGH (ref <150)
VLDL: 39 mg/dL (ref 0–40)

## 2015-06-14 LAB — CBC
HEMATOCRIT: 29.7 % — AB (ref 35.0–47.0)
HEMOGLOBIN: 10.5 g/dL — AB (ref 12.0–16.0)
MCH: 39.1 pg — ABNORMAL HIGH (ref 26.0–34.0)
MCHC: 35.4 g/dL (ref 32.0–36.0)
MCV: 110.4 fL — ABNORMAL HIGH (ref 80.0–100.0)
Platelets: 260 10*3/uL (ref 150–440)
RBC: 2.69 MIL/uL — ABNORMAL LOW (ref 3.80–5.20)
RDW: 19.5 % — ABNORMAL HIGH (ref 11.5–14.5)
WBC: 11.9 10*3/uL — AB (ref 3.6–11.0)

## 2015-06-14 LAB — GLUCOSE, CAPILLARY: Glucose-Capillary: 162 mg/dL — ABNORMAL HIGH (ref 65–99)

## 2015-06-14 LAB — URINALYSIS COMPLETE WITH MICROSCOPIC (ARMC ONLY)
Bilirubin Urine: NEGATIVE
Glucose, UA: NEGATIVE mg/dL
KETONES UR: NEGATIVE mg/dL
NITRITE: NEGATIVE
PH: 5 (ref 5.0–8.0)
PROTEIN: NEGATIVE mg/dL
SPECIFIC GRAVITY, URINE: 1.012 (ref 1.005–1.030)

## 2015-06-14 LAB — COMPREHENSIVE METABOLIC PANEL WITH GFR
ALT: 15 U/L (ref 14–54)
AST: 51 U/L — ABNORMAL HIGH (ref 15–41)
Albumin: 2.9 g/dL — ABNORMAL LOW (ref 3.5–5.0)
Alkaline Phosphatase: 68 U/L (ref 38–126)
Anion gap: 15 (ref 5–15)
BUN: 20 mg/dL (ref 6–20)
CO2: 16 mmol/L — ABNORMAL LOW (ref 22–32)
Calcium: 8 mg/dL — ABNORMAL LOW (ref 8.9–10.3)
Chloride: 87 mmol/L — ABNORMAL LOW (ref 101–111)
Creatinine, Ser: 1.06 mg/dL — ABNORMAL HIGH (ref 0.44–1.00)
GFR calc Af Amer: >60 mL/min
GFR calc non Af Amer: >60 mL/min
Glucose, Bld: 195 mg/dL — ABNORMAL HIGH (ref 65–99)
Potassium: 4.3 mmol/L (ref 3.5–5.1)
Sodium: 118 mmol/L — CL (ref 135–145)
Total Bilirubin: 1.8 mg/dL — ABNORMAL HIGH (ref 0.3–1.2)
Total Protein: 6.6 g/dL (ref 6.5–8.1)

## 2015-06-14 LAB — BRAIN NATRIURETIC PEPTIDE: B Natriuretic Peptide: 345 pg/mL — ABNORMAL HIGH (ref 0.0–100.0)

## 2015-06-14 LAB — RAPID HIV SCREEN (HIV 1/2 AB+AG)
HIV 1/2 ANTIBODIES: NONREACTIVE
HIV-1 P24 Antigen - HIV24: NONREACTIVE

## 2015-06-14 LAB — OSMOLALITY: Osmolality: 241 mOsm/kg — CL (ref 275–295)

## 2015-06-14 LAB — SODIUM, URINE, RANDOM: Sodium, Ur: <10 mmol/L

## 2015-06-14 LAB — HEMOGLOBIN A1C: HEMOGLOBIN A1C: 6.2 % — AB (ref 4.0–6.0)

## 2015-06-14 LAB — CREATININE, URINE, RANDOM: Creatinine, Urine: 138 mg/dL

## 2015-06-14 LAB — CHLORIDE, URINE, RANDOM: Chloride Urine: <15 mmol/L

## 2015-06-14 LAB — TSH: TSH: 4.003 u[IU]/mL (ref 0.350–4.500)

## 2015-06-14 LAB — MRSA PCR SCREENING: MRSA BY PCR: NEGATIVE

## 2015-06-14 LAB — OSMOLALITY, URINE: Osmolality, Ur: 326 mOsm/kg (ref 300–900)

## 2015-06-14 LAB — SODIUM: Sodium: 115 mmol/L — CL (ref 135–145)

## 2015-06-14 MED ORDER — CLOPIDOGREL BISULFATE 75 MG PO TABS
75.0000 mg | ORAL_TABLET | Freq: Every day | ORAL | Status: DC
  Administered 2015-06-14 – 2015-06-16 (×3): 75 mg via ORAL
  Filled 2015-06-14 (×4): qty 1

## 2015-06-14 MED ORDER — INSULIN ASPART 100 UNIT/ML ~~LOC~~ SOLN
0.0000 [IU] | Freq: Three times a day (TID) | SUBCUTANEOUS | Status: DC
  Administered 2015-06-15: 3 [IU] via SUBCUTANEOUS
  Administered 2015-06-15: 5 [IU] via SUBCUTANEOUS
  Administered 2015-06-16: 3 [IU] via SUBCUTANEOUS
  Administered 2015-06-16: 2 [IU] via SUBCUTANEOUS
  Administered 2015-06-16 – 2015-06-20 (×8): 1 [IU] via SUBCUTANEOUS
  Filled 2015-06-14 (×3): qty 1
  Filled 2015-06-14: qty 5
  Filled 2015-06-14 (×3): qty 1
  Filled 2015-06-14: qty 3
  Filled 2015-06-14 (×2): qty 2
  Filled 2015-06-14: qty 1
  Filled 2015-06-14: qty 3
  Filled 2015-06-14: qty 2

## 2015-06-14 MED ORDER — OXYCODONE-ACETAMINOPHEN 5-325 MG PO TABS
1.0000 | ORAL_TABLET | ORAL | Status: AC
  Administered 2015-06-14: 1 via ORAL

## 2015-06-14 MED ORDER — IOHEXOL 350 MG/ML SOLN
100.0000 mL | Freq: Once | INTRAVENOUS | Status: AC | PRN
  Administered 2015-06-14: 75 mL via INTRAVENOUS

## 2015-06-14 MED ORDER — LIDOCAINE 5 % EX PTCH
1.0000 | MEDICATED_PATCH | CUTANEOUS | Status: DC
  Administered 2015-06-14 – 2015-06-23 (×10): 1 via TRANSDERMAL
  Filled 2015-06-14 (×11): qty 1

## 2015-06-14 MED ORDER — ASPIRIN 300 MG RE SUPP: 300.0000 mg | RECTAL | Status: AC

## 2015-06-14 MED ORDER — MAGIC MOUTHWASH
10.0000 mL | Freq: Three times a day (TID) | ORAL | Status: DC
  Administered 2015-06-14 – 2015-06-23 (×24): 10 mL via ORAL
  Filled 2015-06-14 (×34): qty 10

## 2015-06-14 MED ORDER — AZITHROMYCIN 250 MG PO TABS
500.0000 mg | ORAL_TABLET | Freq: Once | ORAL | Status: AC
  Administered 2015-06-14: 500 mg via ORAL
  Filled 2015-06-14: qty 2

## 2015-06-14 MED ORDER — OXYCODONE-ACETAMINOPHEN 5-325 MG PO TABS
ORAL_TABLET | ORAL | Status: AC
  Administered 2015-06-14: 1 via ORAL
  Filled 2015-06-14: qty 1

## 2015-06-14 MED ORDER — CLOPIDOGREL BISULFATE 75 MG PO TABS
300.0000 mg | ORAL_TABLET | Freq: Once | ORAL | Status: AC
  Administered 2015-06-14: 300 mg via ORAL

## 2015-06-14 MED ORDER — BUDESONIDE 0.25 MG/2ML IN SUSP
2.0000 mL | Freq: Two times a day (BID) | RESPIRATORY_TRACT | Status: DC
  Administered 2015-06-14: 0.25 mg via RESPIRATORY_TRACT
  Filled 2015-06-14: qty 2

## 2015-06-14 MED ORDER — IPRATROPIUM-ALBUTEROL 0.5-2.5 (3) MG/3ML IN SOLN
3.0000 mL | RESPIRATORY_TRACT | Status: DC | PRN
  Administered 2015-06-17 – 2015-06-19 (×4): 3 mL via RESPIRATORY_TRACT
  Filled 2015-06-14 (×4): qty 3

## 2015-06-14 MED ORDER — ONDANSETRON HCL 4 MG/2ML IJ SOLN: 4.0000 mg | Freq: Four times a day (QID) | INTRAMUSCULAR | Status: DC | PRN

## 2015-06-14 MED ORDER — ALBUTEROL SULFATE (2.5 MG/3ML) 0.083% IN NEBU: 3.0000 mL | INHALATION_SOLUTION | Freq: Four times a day (QID) | RESPIRATORY_TRACT | Status: DC | PRN

## 2015-06-14 MED ORDER — ASPIRIN 81 MG PO CHEW
324.0000 mg | CHEWABLE_TABLET | ORAL | Status: AC
  Administered 2015-06-14: 324 mg via ORAL
  Filled 2015-06-14: qty 4

## 2015-06-14 MED ORDER — NICOTINE 21 MG/24HR TD PT24
21.0000 mg | MEDICATED_PATCH | Freq: Every day | TRANSDERMAL | Status: DC
  Administered 2015-06-14 – 2015-06-23 (×10): 21 mg via TRANSDERMAL
  Filled 2015-06-14 (×10): qty 1

## 2015-06-14 MED ORDER — NICOTINE POLACRILEX 2 MG MT GUM
2.0000 mg | CHEWING_GUM | OROMUCOSAL | Status: DC | PRN
  Filled 2015-06-14: qty 1

## 2015-06-14 MED ORDER — ASPIRIN EC 81 MG PO TBEC: 81.0000 mg | DELAYED_RELEASE_TABLET | Freq: Every day | ORAL | Status: DC

## 2015-06-14 MED ORDER — LEVOFLOXACIN 500 MG PO TABS
500.0000 mg | ORAL_TABLET | Freq: Every day | ORAL | Status: DC
Start: 2015-06-14 — End: 2015-06-14

## 2015-06-14 MED ORDER — IPRATROPIUM-ALBUTEROL 0.5-2.5 (3) MG/3ML IN SOLN
3.0000 mL | Freq: Four times a day (QID) | RESPIRATORY_TRACT | Status: AC
  Administered 2015-06-14 – 2015-06-17 (×12): 3 mL via RESPIRATORY_TRACT
  Filled 2015-06-14 (×12): qty 3

## 2015-06-14 MED ORDER — ASPIRIN EC 81 MG PO TBEC
81.0000 mg | DELAYED_RELEASE_TABLET | Freq: Every day | ORAL | Status: DC
  Administered 2015-06-14 – 2015-06-23 (×10): 81 mg via ORAL
  Filled 2015-06-14 (×10): qty 1

## 2015-06-14 MED ORDER — NITROGLYCERIN 0.4 MG SL SUBL: 0.4000 mg | SUBLINGUAL_TABLET | SUBLINGUAL | Status: DC | PRN

## 2015-06-14 MED ORDER — SODIUM CHLORIDE 3 % IV SOLN
INTRAVENOUS | Status: DC
  Administered 2015-06-14: 50 mL/h via INTRAVENOUS
  Filled 2015-06-14 (×4): qty 500

## 2015-06-14 MED ORDER — ATORVASTATIN CALCIUM 20 MG PO TABS
20.0000 mg | ORAL_TABLET | Freq: Every day | ORAL | Status: DC
  Administered 2015-06-15 – 2015-06-22 (×8): 20 mg via ORAL
  Filled 2015-06-14 (×8): qty 1

## 2015-06-14 MED ORDER — LEVOFLOXACIN 500 MG PO TABS
500.0000 mg | ORAL_TABLET | Freq: Every day | ORAL | Status: DC
  Administered 2015-06-14 – 2015-06-17 (×4): 500 mg via ORAL
  Filled 2015-06-14 (×4): qty 1

## 2015-06-14 MED ORDER — SODIUM CHLORIDE 3 % IV SOLN
INTRAVENOUS | Status: DC
  Administered 2015-06-14: 30 mL/h via INTRAVENOUS
  Administered 2015-06-16: 50 mL/h via INTRAVENOUS
  Administered 2015-06-16 (×2): 40 mL/h via INTRAVENOUS
  Administered 2015-06-17 (×2): 50 mL/h via INTRAVENOUS
  Filled 2015-06-14 (×12): qty 500

## 2015-06-14 MED ORDER — NITROGLYCERIN 2 % TD OINT
0.5000 [in_us] | TOPICAL_OINTMENT | Freq: Four times a day (QID) | TRANSDERMAL | Status: DC
  Administered 2015-06-14 – 2015-06-17 (×14): 0.5 [in_us] via TOPICAL
  Filled 2015-06-14 (×13): qty 1

## 2015-06-14 MED ORDER — BUDESONIDE 0.25 MG/2ML IN SUSP
2.0000 mL | Freq: Two times a day (BID) | RESPIRATORY_TRACT | Status: AC
  Administered 2015-06-14 – 2015-06-17 (×6): 0.25 mg via RESPIRATORY_TRACT
  Filled 2015-06-14 (×6): qty 2

## 2015-06-14 MED ORDER — ENOXAPARIN SODIUM 40 MG/0.4ML ~~LOC~~ SOLN
40.0000 mg | SUBCUTANEOUS | Status: DC
  Administered 2015-06-15 – 2015-06-19 (×5): 40 mg via SUBCUTANEOUS
  Filled 2015-06-14 (×2): qty 0.4
  Filled 2015-06-14: qty 2
  Filled 2015-06-14 (×3): qty 0.4

## 2015-06-14 MED ORDER — PREDNISONE 20 MG PO TABS
40.0000 mg | ORAL_TABLET | Freq: Every day | ORAL | Status: AC
  Administered 2015-06-14 – 2015-06-16 (×3): 40 mg via ORAL
  Filled 2015-06-14 (×3): qty 2

## 2015-06-14 MED ORDER — ACETAMINOPHEN 325 MG PO TABS
650.0000 mg | ORAL_TABLET | ORAL | Status: DC | PRN
  Administered 2015-06-14 – 2015-06-19 (×3): 650 mg via ORAL
  Filled 2015-06-14 (×2): qty 2

## 2015-06-14 MED ORDER — ENOXAPARIN SODIUM 100 MG/ML ~~LOC~~ SOLN
1.0000 mg/kg | Freq: Two times a day (BID) | SUBCUTANEOUS | Status: DC
  Administered 2015-06-14 (×2): 95 mg via SUBCUTANEOUS
  Filled 2015-06-14 (×2): qty 1

## 2015-06-14 MED ORDER — SODIUM CHLORIDE 0.9 % IV SOLN
INTRAVENOUS | Status: DC
  Administered 2015-06-14: 10:00:00 via INTRAVENOUS

## 2015-06-14 NOTE — Progress Notes (Signed)
MEDICATION RELATED CONSULT NOTE - Follow Up   Pharmacy Consult for hypertonic saline monitoring Indication: hyponatremia   Patient Measurements: Height: 5\' 8"  (172.7 cm) Weight: 204 lb 9.4 oz (92.8 kg) IBW/kg (Calculated) : 63.9  Vital Signs: Temp: 97.7 F (36.5 C) (11/26 1200) Temp Source: Oral (11/26 1200) BP: 113/55 mmHg (11/26 1800) Pulse Rate: 94 (11/26 1500)  Labs:  Recent Labs  06/13/15 2205 06/14/15 0053  06/14/15 0249 06/14/15 0546 06/14/15 0800 06/14/15 1812  WBC 14.8*  --   --  11.9*  --   --   --   HGB 11.5*  --   --  10.5*  --   --   --   HCT 32.7*  --   --  29.7*  --   --   --   PLT 304  --   --  260  --   --   --   CREATININE 0.89  --   < > 0.98 1.05* 1.07* 1.10*  LABCREA  --  138  --   --   --   --   --   < > = values in this interval not displayed. Estimated Creatinine Clearance: 72.9 mL/min (by C-G formula based on Cr of 1.1).   Medical History: Past Medical History  Diagnosis Date  . Arthritis   . COPD (chronic obstructive pulmonary disease) (HCC)   . Gastric ulcer   . OSA on CPAP no ins - no cpap machine for the last 4 years  . Asthma   . Hypertension     Medications:  Infusions:  . sodium chloride (hypertonic) Stopped (06/14/15 0945)  . sodium chloride (hypertonic)      Assessment: 49 yof cc SOB being worked up for SOB/chest pain presents with serum sodium 112 mEq/L. Starting hypertonic saline.  1/26 0800 sodium 115 11/26 1358 sodium 115  Goal of Therapy:  1. Increase serum sodium 4 to 6 mEq/L as soon as possible 2. Do not exceed increase of 8 mEq/L per day  Plan:  Sodium chloride 3% started at 50 mL/hr 11/26 @ 0615. Increase 3 mEq. Sodium levels are being checked every 4 hours. Pharmacy will continue to follow.  11/26: Na level @ 18:00 = 115 Will continue this pt on Hypertonic Saline at current rate and recheck Na in 4 hrs on 11/26 @ 22:00.   Cher NakaiSheema Hallaji, PharmD Pharmacy Resident 06/14/2015 6:54 PM

## 2015-06-14 NOTE — H&P (Addendum)
[facility] [place] at [facility]   PATIENT NAME: Erica ChacoDonna [name]    MR#:  [id number]  DATE OF BIRTH:  [birth year]  DATE OF ADMISSION:  06/13/2015  PRIMARY CARE PHYSICIAN: [facility]   REQUESTING/REFERRING PHYSICIAN:   CHIEF COMPLAINT:   Chief Complaint  Patient presents with  . Shortness of Breath    HISTORY OF PRESENT ILLNESS: Erica [name]  is a 49 y.o. female with a known history of COPD, arthritis presented to the emergency room with shortness of breath and chest tightness. Patient has increased shortness of breath for the last couple of days. She has been retaining fluid in the legs for the last 2 months. Progressive shortness of breath for the last 1 week. Has history of orthopnea. No history of any congestive heart failure. Patient chest tightness gets worsened whenever she is short of breath. The tightness she describes it as aching in nature 4 out of 10 on a scale of 1-10. No history of any prior cardiac workup. Patient was evaluated in the emergency room her first set of troponin was abnormal and sodium was low around 112. No history of any seizures. No history of any tingling or numbness. No history of any confusion.  PAST MEDICAL HISTORY:   Past Medical History  Diagnosis Date  . Arthritis   . COPD (chronic obstructive pulmonary disease) (HCC)   . Gastric ulcer     PAST SURGICAL HISTORY:  Past Surgical History  Procedure Laterality Date  . Knee surgery Left     8 knee surgeries    SOCIAL HISTORY:  Social History  Substance Use Topics  . Smoking status: Current Every Day Smoker -- 1.50 packs/day for 36 years    Types: Cigarettes  . Smokeless tobacco: Not on file  . Alcohol Use: Yes     Comment: occasionaly    FAMILY HISTORY: History reviewed. No pertinent family history.  DRUG ALLERGIES:  Allergies  Allergen Reactions  . Tramadol Other (See Comments)    Heart palpatations  . Penicillins Rash    REVIEW OF SYSTEMS:    CONSTITUTIONAL: No fever, fatigue and weakness noted.  EYES: No blurred or double vision.  EARS, NOSE, AND THROAT: No tinnitus or ear pain.  RESPIRATORY:  shortness of breath present, no wheezing or hemoptysis.  CARDIOVASCULAR: chest tightness noted, orthopnea present, leg edema noted.  GASTROINTESTINAL: No nausea, vomiting, diarrhea or abdominal pain.  GENITOURINARY: No dysuria, hematuria.  ENDOCRINE: No polyuria, nocturia,  HEMATOLOGY: No anemia, easy bruising or bleeding SKIN: No rash or lesion. MUSCULOSKELETAL: Has arthritis.   NEUROLOGIC: No tingling, numbness, weakness.  PSYCHIATRY: No anxiety or depression.   MEDICATIONS AT HOME:  Prior to Admission medications   Medication Sig Start Date End Date Taking? Authorizing Provider  albuterol (PROVENTIL HFA;VENTOLIN HFA) 108 (90 BASE) MCG/ACT inhaler Inhale 2 puffs into the lungs every 6 (six) hours as needed for wheezing or shortness of breath.    Historical Provider, MD  beclomethasone (QVAR) 40 MCG/ACT inhaler Inhale 1 puff into the lungs 2 (two) times daily.    Historical Provider, MD      PHYSICAL EXAMINATION:   VITAL SIGNS: Blood pressure 122/53, pulse 93, temperature 98.6 F (37 C), resp. rate 29, height 5\' 8"  (1.727 m), weight 90.719 kg (200 lb), last menstrual period 03/13/2015, SpO2 100 %.  GENERAL:  49 y.o.-year-old patient lying in the bed with no acute distress.  EYES: Pupils equal, round, reactive to light and accommodation. No scleral icterus. Extraocular  muscles intact.  HEENT: Head atraumatic, normocephalic. Oropharynx and nasopharynx clear.  NECK:  Supple, no jugular venous distention. No thyroid enlargement, no tenderness.  LUNGS: Normal breath sounds bilaterally, no wheezing, rales,rhonchi or crepitation. No use of accessory muscles of respiration.  CARDIOVASCULAR: S1, S2 normal. No murmurs, rubs, or gallops.  ABDOMEN: Soft, nontender, nondistended. Bowel sounds present. No organomegaly or mass.   EXTREMITIES: pedal edema noted, no cyanosis, or clubbing.  NEUROLOGIC: Cranial nerves II through XII are intact. Muscle strength 5/5 in all extremities. Sensation intact. Gait not checked.  PSYCHIATRIC: The patient is alert and oriented x 3.  SKIN: No obvious rash, lesion, or ulcer.   LABORATORY PANEL:   CBC  Recent Labs Lab 06/13/15 2205  WBC 14.8*  HGB 11.5*  HCT 32.7*  PLT 304  MCV 111.6*  MCH 39.2*  MCHC 35.1  RDW 19.2*   ------------------------------------------------------------------------------------------------------------------  Chemistries   Recent Labs Lab 06/13/15 2205  NA 112*  K 4.5  CL 80*  CO2 19*  GLUCOSE 148*  BUN 20  CREATININE 0.89  CALCIUM 8.2*   ------------------------------------------------------------------------------------------------------------------ estimated creatinine clearance is 89.1 mL/min (by C-G formula based on Cr of 0.89). ------------------------------------------------------------------------------------------------------------------ No results for input(s): TSH, T4TOTAL, T3FREE, THYROIDAB in the last 72 hours.  Invalid input(s): FREET3   Coagulation profile No results for input(s): INR, PROTIME in the last 168 hours. ------------------------------------------------------------------------------------------------------------------- No results for input(s): DDIMER in the last 72 hours. -------------------------------------------------------------------------------------------------------------------  Cardiac Enzymes  Recent Labs Lab 06/13/15 2205  TROPONINI 0.06*   ------------------------------------------------------------------------------------------------------------------ Invalid input(s): POCBNP  ---------------------------------------------------------------------------------------------------------------  Urinalysis    Component Value Date/Time   COLORURINE AMBER* 06/14/2015 0053   COLORURINE  Yellow 02/26/2014 2019   APPEARANCEUR HAZY* 06/14/2015 0053   APPEARANCEUR Clear 02/26/2014 2019   LABSPEC 1.012 06/14/2015 0053   LABSPEC 1.014 02/26/2014 2019   PHURINE 5.0 06/14/2015 0053   PHURINE 6.0 02/26/2014 2019   GLUCOSEU NEGATIVE 06/14/2015 0053   GLUCOSEU Negative 02/26/2014 2019   HGBUR 1+* 06/14/2015 0053   HGBUR Negative 02/26/2014 2019   BILIRUBINUR NEGATIVE 06/14/2015 0053   BILIRUBINUR Negative 02/26/2014 2019   KETONESUR NEGATIVE 06/14/2015 0053   KETONESUR Negative 02/26/2014 2019   PROTEINUR NEGATIVE 06/14/2015 0053   PROTEINUR Negative 02/26/2014 2019   NITRITE NEGATIVE 06/14/2015 0053   NITRITE Negative 02/26/2014 2019   LEUKOCYTESUR 3+* 06/14/2015 0053   LEUKOCYTESUR Trace 02/26/2014 2019     RADIOLOGY: Dg Chest 2 View  06/13/2015  CLINICAL DATA:  Shortness of breath. Asthma. COPD. Swelling of the feet and legs. EXAM: CHEST  2 VIEW COMPARISON:  03/23/2013 FINDINGS: Indistinct pulmonary vasculature, with slight interstitial accentuation in the lung bases. Is is heart size within normal limits. No pleural effusion. Mild bilateral airway thickening. IMPRESSION: 1. Airway thickening is present, suggesting bronchitis or reactive airways disease. There is faint associated interstitial accentuation in the lung bases. No overt airspace opacity. Electronically Signed   By: Gaylyn Rong M.D.   On: 06/13/2015 22:34    EKG: Orders placed or performed during the hospital encounter of 06/13/15  . EKG 12-Lead  . EKG 12-Lead  . ED EKG  . ED EKG    IMPRESSION AND PLAN: 1. Dyspnea rule out CHF 2. Chest tightness 3. Unstable angina 4. Hyponatremia 5. Urinary tract infection 6. COPD Management plan Admit patient to ICU stepdown unit Aspirin 81 mg by mouth daily Plavix 300 MG orally now and start 75 mg orally daily Anticoagulate patient with full dose Lovenox subcutaneously Start patient on oral Levaquin  antibiotic for urinary tract infection Repeat sodium  level and if persistently low will start hypertonic saline Currently patient does not have any neurological symptoms such as confusion or any seizure activity Fluid restriction 1500 mL per day Inhalation treatments for COPD to continue Cycle troponins Monitor patient on telemetry Check echocardiogram and cardiology consultation for unstable angina  All the records are reviewed and case discussed with ED provider. Management plans discussed with the patient, family and they are in agreement.  CODE STATUS: FULL Advance Directive Documentation        Most Recent Value   Type of Advance Directive  Living will   Pre-existing out of facility DNR order (yellow form or pink MOST form)     "MOST" Form in Place?         TOTAL CRITICAL CARE TIME TAKING CARE OF THIS PATIENT: 58 minutes.    Ihor Austin M.D on 06/14/2015 at 1:28 AM  Between 7am to 6pm - Pager - [phone]  After 6pm go to [email or web address] - password [email or web address]  [facility]  Office  [phone]  CC: Primary care physician; [facility]

## 2015-06-14 NOTE — Progress Notes (Signed)
MEDICATION RELATED CONSULT NOTE - Follow Up   Pharmacy Consult for hypertonic saline monitoring Indication: hyponatremia   Patient Measurements: Height: 5\' 8"  (172.7 cm) Weight: 204 lb 9.4 oz (92.8 kg) IBW/kg (Calculated) : 63.9  Vital Signs: Temp: 98.5 F (36.9 C) (11/26 2000) Temp Source: Oral (11/26 2000) BP: 111/54 mmHg (11/26 2000) Pulse Rate: 105 (11/26 2200)  Labs:  Recent Labs  06/13/15 2205 06/14/15 0053  06/14/15 0249  06/14/15 0800 06/14/15 1812 06/14/15 2042  WBC 14.8*  --   --  11.9*  --   --   --   --   HGB 11.5*  --   --  10.5*  --   --   --   --   HCT 32.7*  --   --  29.7*  --   --   --   --   PLT 304  --   --  260  --   --   --   --   CREATININE 0.89  --   < > 0.98  < > 1.07* 1.10* 1.06*  LABCREA  --  138  --   --   --   --   --   --   ALBUMIN  --   --   --   --   --   --   --  2.9*  PROT  --   --   --   --   --   --   --  6.6  AST  --   --   --   --   --   --   --  51*  ALT  --   --   --   --   --   --   --  15  ALKPHOS  --   --   --   --   --   --   --  68  BILITOT  --   --   --   --   --   --   --  1.8*  < > = values in this interval not displayed. Estimated Creatinine Clearance: 75.7 mL/min (by C-G formula based on Cr of 1.06).   Medical History: Past Medical History  Diagnosis Date  . Arthritis   . COPD (chronic obstructive pulmonary disease) (HCC)   . Gastric ulcer   . OSA on CPAP no ins - no cpap machine for the last 4 years  . Asthma   . Hypertension     Medications:  Infusions:  . sodium chloride (hypertonic) 30 mL/hr at 06/14/15 2200    Assessment: 49 yof cc SOB being worked up for SOB/chest pain presents with serum sodium 112 mEq/L. Starting hypertonic saline.  1/26 0800 sodium 115 11/26 1358 sodium 115  Goal of Therapy:  1. Increase serum sodium 4 to 6 mEq/L as soon as possible 2. Do not exceed increase of 8 mEq/L per day  Plan:  1126 2042 sodium level 118 mEq/L. Total increase of 6 mEq/L in past 24 hours - safe rate  of correction. Currently running sodium chloride 3% 30 mL/hr. Continue current rate and will check sodium again in 4 hours, 06/15/15 at 0100. Pharmacy will continue to follow.  Soniyah Mcglory A. Murphyookson, VermontPharm.D., BCPS Clinical Pharmacist  06/14/2015 10:42 PM

## 2015-06-14 NOTE — Progress Notes (Addendum)
[facility] - [place] at [facility]   PATIENT NAME: Erica [name]    MR#:  [id number]  DATE OF BIRTH:  1966/05/07  SUBJECTIVE:  CHIEF COMPLAINT:   Chief Complaint  Patient presents with  . Shortness of Breath   patient is 49 year old Caucasian female with history of COPD, ongoing tobacco abuse who presents to the hospital with complaints of shortness of breath, chest tightness. On arrival to emergency room, she was noted to have mild elevation of troponin and very low sodium level at 112. Denies any neurologic symptoms including seizures, tingling or numbness or confusion. Admits of lower extremity swelling and pain, also chronic lower back pain. Patient was initiated on 3% sodium solution with minimal improvement of her sodium levels. 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 Nephrologist saw patient in consultation and recommended normal saline infusion due to concerns of underlying dehydration. Patient was seen by pulmonologist/intensivist, who felt that she is to continue COPD therapy. Patient complains of lower extremity pain, chronic low back pain, leg edema.   ROS  VITAL SIGNS: Blood pressure 107/46, pulse 85, temperature 98.8 F (37.1 C), temperature source Oral, resp. rate 27, height  (1.727 m), weight 92.8 kg (204 lb 9.4 oz), last menstrual period 03/13/2015, SpO2 95 %.  PHYSICAL EXAMINATION:   GENERAL:  49 y.o.-year-old patient lying in the bed with no acute distress. Looking older than stated age EYES: Pupils equal, round, reactive to light and accommodation. No scleral icterus. Extraocular muscles intact.  HEENT: Head atraumatic, normocephalic. Oropharynx and nasopharynx clear.  NECK:  Supple, no jugular venous distention. No thyroid enlargement, no tenderness.  LUNGS: Markedly diminished breath sounds bilaterally, no wheezing, rales,rhonchi or crepitation. Using accessory muscles of respiration with exertion.   CARDIOVASCULAR: S1, S2 normal. No murmurs, rubs, or gallops.  ABDOMEN: Soft, nontender, nondistended. Bowel sounds present. No organomegaly or mass.  EXTREMITIES: Trace lower extremity and pedal edema, no cyanosis, or clubbing. Dorsalis pedis pulses are palpable at 1+ NEUROLOGIC: Cranial nerves II through XII are intact. Muscle strength 5/5 in all extremities. Sensation intact. Gait not checked.  PSYCHIATRIC: The patient is alert and oriented x 3.  SKIN: No obvious rash, lesion, or ulcer.   ORDERS/RESULTS REVIEWED:   CBC  Recent Labs Lab 06/13/15 2205 06/14/15 0249  WBC 14.8* 11.9*  HGB 11.5* 10.5*  HCT 32.7* 29.7*  PLT 304 260  MCV 111.6* 110.4*  MCH 39.2* 39.1*  MCHC 35.1 35.4  RDW 19.2* 19.5*   ------------------------------------------------------------------------------------------------------------------  Chemistries   Recent Labs Lab 06/13/15 2205 06/14/15 0136 06/14/15 0249 06/14/15 0546 06/14/15 0800  NA 112* 114* 112* 114* 115*  K 4.5 3.8 4.0 4.5 4.1  CL 80* 82* 80* 82* 83*  CO2 19* 20* 21* 21* 22  GLUCOSE 148* 143* 144* 134* 117*  BUN 21*  CREATININE 0.89 0.96 0.98 1.05* 1.07*  CALCIUM 8.2* 8.1* 7.9* 8.0* 7.9*   ------------------------------------------------------------------------------------------------------------------ estimated creatinine clearance is 75 mL/min (by C-G formula based on Cr of 1.07). ------------------------------------------------------------------------------------------------------------------  Recent Labs  06/13/15 2205  TSH 4.003    Cardiac Enzymes  Recent Labs Lab 06/13/15 2205 06/14/15 0249 06/14/15 0800  TROPONINI 0.06* <0.03 <0.03   ------------------------------------------------------------------------------------------------------------------ Invalid input(s): POCBNP ---------------------------------------------------------------------------------------------------------------  RADIOLOGY: Dg  Chest 2 View  06/13/2015  CLINICAL DATA:  Shortness of breath. Asthma. COPD. Swelling of the feet and legs. EXAM: CHEST  2 VIEW COMPARISON:  03/23/2013 FINDINGS: Indistinct pulmonary vasculature, with slight interstitial accentuation in  the lung bases. Is is heart size within normal limits. No pleural effusion. Mild bilateral airway thickening. IMPRESSION: 1. Airway thickening is present, suggesting bronchitis or reactive airways disease. There is faint associated interstitial accentuation in the lung bases. No overt airspace opacity. Electronically Signed   By: Gaylyn RongWalter  Liebkemann M.D.   On: 06/13/2015 22:34    EKG:  Orders placed or performed during the hospital encounter of 06/13/15  . EKG 12-Lead  . EKG 12-Lead  . ED EKG  . ED EKG    ASSESSMENT AND PLAN:  Principal Problem:   Hyponatremia Active Problems:   Chest pain at rest 1. COPD exacerbation, continue patient on oxygen, azithromycin, Pulmicort, DuoNeb's, prednisone, appreciate pulmonologist input, get sputum cultures if possible, following clinically 2. Lower extremity edema , likely right-sided heart failure due to bad COPD, get echocardiogram, get Doppler ultrasound to rule out DVT 3. 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 3% sodium solution through central line only, following sodium levels every 4 hours 4. Renal insufficiency, followed with rehydration, questionable acute pyelonephritis, get renal ultrasound 5. Acute cystitis, awaiting for urinary cultures, continue levofloxacin 6. Elevated troponin, likely demand ischemia, getting echocardiogram 7. Hyperglycemia. Get hemoglobin A1c 8. Tobacco abuse, discussed this patient for 4 minutes. Nicotine replacement therapy is initiated  Management plans discussed with the patient, family and they are in agreement.   DRUG  ALLERGIES:  Allergies  Allergen Reactions  . Tramadol Other (See Comments)    Heart palpatations  . Penicillins Rash    CODE STATUS:     Code Status Orders        Start     Ordered   06/14/15 0202  Full code   Continuous     06/14/15 0201    Advance Directive Documentation        Most Recent Value   Type of Advance Directive  Living will   Pre-existing out of facility DNR order (yellow form or pink MOST form)     "MOST" Form in Place?        TOTAL critical care TIME TAKING CARE OF THIS PATIENT: 40 minutes.  Discussed with intensivist, Dr. Lovena LeMungal  Aaron Boeh M.D on 06/14/2015 at 11:29 AM  Between 7am to 6pm - Pager - [phone]  After 6pm go to [email or web address] - password [email or web address]  [facility]  Office  [phone]  CC: Primary care physician; [facility]

## 2015-06-14 NOTE — Progress Notes (Signed)
ANTICOAGULATION CONSULT NOTE - Initial Consult  Pharmacy Consult for Lovenox Indication: chest pain/ACS  Allergies  Allergen Reactions  . Tramadol Other (See Comments)    Heart palpatations  . Penicillins Rash    Patient Measurements: Height: 5\' 8"  (172.7 cm) Weight: 204 lb 9.4 oz (92.8 kg) IBW/kg (Calculated) : 63.9 Heparin Dosing Weight:   Vital Signs: Temp: 98.6 F (37 C) (11/25 2142) BP: 122/53 mmHg (11/26 0100) Pulse Rate: 93 (11/26 0100)  Labs:  Recent Labs  06/13/15 2205 06/14/15 0136  HGB 11.5*  --   HCT 32.7*  --   PLT 304  --   CREATININE 0.89 0.96  TROPONINI 0.06*  --     Estimated Creatinine Clearance: 83.6 mL/min (by C-G formula based on Cr of 0.96).   Medical History: Past Medical History  Diagnosis Date  . Arthritis   . COPD (chronic obstructive pulmonary disease) (HCC)   . Gastric ulcer     Medications:  Infusions:    Assessment: 49 yof cc SOB presents with chest tightness, edema. Treating for unstable angina/ACS with full dose enoxaparin.   Goal of Therapy:  Anti-Xa level 0.6-1 units/ml 4hrs after LMWH dose given Monitor platelets by anticoagulation protocol: Yes   Plan:  Lovenox 95 mg subcutaneously BID.   Carola FrostNathan A Nathyn Luiz, Pharm.D., BCPS Clinical Pharmacist 06/14/2015,2:08 AM

## 2015-06-14 NOTE — Consult Note (Signed)
[facility] CONSULT NOTE    Date: 06/14/2015                  Patient Name:  Erica [name]  MRN: [id number]  DOB: 1966-03-14  Age / Sex: 49 y.o., female         PCP: [facility]                 Service Requesting Consult: Dr. Tobi Bastos                 Reason for Consult: hyponatremia            History of Present Illness: Patient is a 50 y.o. female with a PMHx of COPD, gastric ulcer, osteoarthritis, tobacco abuse, who was admitted to [facility] on 06/13/2015 for evaluation of shortness of breath and cough. Patient reports that she has had progressive shortness of breath over the past 2-3 weeks. This is associated with a cough with productive sputum. She also reports that she's had diminished appetite over the past several weeks. Over the past week she's had very little by mouth intake and has had multiple episodes of vomiting. She also reports that she's had oral thrush for several months. She has not seen a physician in over one year. [facility] We are asked to evaluate her for severe hyponatremia. Serum sodium upon presentation was 112 and is currently up to 1:15. No reported seizures. She has also been evaluated by pulmonary critical care. She was initially treated with 3% saline but was taken off under our instruction.   Medications: Outpatient medications: Prescriptions prior to admission  Medication Sig Dispense Refill Last Dose  . albuterol (PROVENTIL HFA;VENTOLIN HFA) 108 (90 BASE) MCG/ACT inhaler Inhale 2 puffs into the lungs every 6 (six) hours as needed for wheezing or shortness of breath.   Past Week at Unknown time  . beclomethasone (QVAR) 40 MCG/ACT inhaler Inhale 1 puff into the lungs 2 (two) times daily.   Not Taking at Unknown time    Current medications: Current Facility-Administered Medications  Medication Dose Route Frequency Provider Last Rate Last Dose  . 0.9 %  sodium chloride infusion    Intravenous Continuous Fredy Gladu, MD 100 mL/hr at 06/14/15 0957    . acetaminophen (TYLENOL) tablet 650 mg  650 mg Oral Q4H PRN Ihor Austin, MD   650 mg at 06/14/15 0656  . albuterol (PROVENTIL) (2.5 MG/3ML) 0.083% nebulizer solution 3 mL  3 mL Inhalation Q6H PRN Ihor Austin, MD      . aspirin EC tablet 81 mg  81 mg Oral Daily Pavan Pyreddy, MD   81 mg at 06/14/15 1001  . atorvastatin (LIPITOR) tablet 20 mg  20 mg Oral q1800 Pavan Pyreddy, MD      . budesonide (PULMICORT) nebulizer solution 0.25 mg  2 mL Inhalation BID Vishal Mungal, MD      . clopidogrel (PLAVIX) tablet 75 mg  75 mg Oral Q breakfast Ihor Austin, MD   75 mg at 06/14/15 0906  . enoxaparin (LOVENOX) injection 95 mg  1 mg/kg Subcutaneous BID Ihor Austin, MD   95 mg at 06/14/15 1000  . ipratropium-albuterol (DUONEB) 0.5-2.5 (3) MG/3ML nebulizer solution 3 mL  3 mL Nebulization Q6H Vishal Mungal, MD   3 mL at 06/14/15 0820  . ipratropium-albuterol (DUONEB) 0.5-2.5 (3) MG/3ML nebulizer solution 3 mL  3 mL Nebulization Q4H PRN Stephanie Acre, MD      .  levofloxacin (LEVAQUIN) tablet 500 mg  500 mg Oral QHS Pavan Pyreddy, MD      . magic mouthwash  10 mL Oral TID Vishal Mungal, MD   10 mL at 06/14/15 1000  . nicotine (NICODERM CQ - dosed in mg/24 hours) patch 21 mg  21 mg Transdermal Daily Katharina Caperima Vaickute, MD      . nicotine polacrilex (NICORETTE) gum 2 mg  2 mg Oral PRN Katharina Caperima Vaickute, MD      . nitroGLYCERIN (NITROGLYN) 2 % ointment 0.5 inch  0.5 inch Topical 4 times per day Ihor AustinPavan Pyreddy, MD   0.5 inch at 06/14/15 0906  . nitroGLYCERIN (NITROSTAT) SL tablet 0.4 mg  0.4 mg Sublingual Q5 Min x 3 PRN Pavan Pyreddy, MD      . ondansetron (ZOFRAN) injection 4 mg  4 mg Intravenous Q6H PRN Pavan Pyreddy, MD      . predniSONE (DELTASONE) tablet 40 mg  40 mg Oral Q breakfast Vishal Mungal, MD   40 mg at 06/14/15 0906  . sodium chloride (hypertonic) 3 % solution   Intravenous Continuous Ihor AustinPavan Pyreddy, MD   Stopped at 06/14/15 0945       Allergies: Allergies  Allergen Reactions  . Tramadol Other (See Comments)    Heart palpatations  . Penicillins Rash      Past Medical History: Past Medical History  Diagnosis Date  . Arthritis   . COPD (chronic obstructive pulmonary disease) (HCC)   . Gastric ulcer   . OSA on CPAP no ins - no cpap machine for the last 4 years     Past Surgical History: Past Surgical History  Procedure Laterality Date  . Knee surgery Left     8 knee surgeries     Family History: Family History  Problem Relation Age of Onset  . Lung cancer Father   . Breast cancer Mother   . Diabetes Maternal Grandmother      Social History: Social History   Social History  . Marital Status: Divorced    Spouse Name: N/A  . Number of Children: N/A  . Years of Education: N/A   Occupational History  . Not on file.   Social History Main Topics  . Smoking status: Current Every Day Smoker -- 1.50 packs/day for 36 years    Types: Cigarettes  . Smokeless tobacco: Not on file  . Alcohol Use: 0.0 oz/week    0 Standard drinks or equivalent per week     Comment: occasionaly  . Drug Use: No  . Sexual Activity: Not on file   Other Topics Concern  . Not on file   Social History Narrative     Review of Systems: Review of Systems  Constitutional: Positive for weight loss and malaise/fatigue. Negative for fever, chills and diaphoresis.  HENT: Negative for ear pain, nosebleeds and tinnitus.   Eyes: Negative for blurred vision and double vision.  Respiratory: Positive for cough, sputum production and shortness of breath. Negative for hemoptysis.   Cardiovascular: Positive for orthopnea. Negative for palpitations, claudication and leg swelling.  Gastrointestinal: Positive for nausea and vomiting. Negative for heartburn, abdominal pain and diarrhea.  Genitourinary: Negative for dysuria and urgency.  Musculoskeletal: Negative for myalgias and neck pain.  Skin: Negative for itching and rash.   Neurological: Positive for weakness. Negative for dizziness and focal weakness.  Endo/Heme/Allergies: Does not bruise/bleed easily.  Psychiatric/Behavioral: Negative for depression. The patient is not nervous/anxious.     Vital Signs: Blood pressure 109/51, pulse 82, temperature 98.8  F (37.1 C), temperature source Oral, resp. rate 27, height  (1.727 m), weight 92.8 kg (204 lb 9.4 oz), last menstrual period 03/13/2015, SpO2 94 %.  Weight trends: Filed Weights   06/13/15 2142 06/14/15 0158  Weight: 90.719 kg (200 lb) 92.8 kg (204 lb 9.4 oz)    Physical Exam: General: NAD, resting in bed  Head: Normocephalic, atraumatic.  Eyes: Anicteric, EOMI  Nose: No discharge or epistaxis noted  Throat: Oral thrush noted, no pharyngeal exudates  Neck: Supple, trachea midline  Lungs:  Normal respiratory effort. Clear to auscultation BL without crackles or wheezes.  Heart: RRR. S1 and S2 normal without gallop, murmur, or rubs.  Abdomen:  BS normoactive. Soft, Nondistended, non-tender.  No masses or organomegaly.  Extremities: No pretibial edema.  Neurologic: A&O X3, Motor strength is 5/5 in the all 4 extremities  Skin: No visible rashes, scars.    Lab results: Basic Metabolic Panel:  Recent Labs Lab 06/14/15 0249 06/14/15 0546 06/14/15 0800  NA 112* 114* 115*  K 4.0 4.5 4.1  CL 80* 82* 83*  CO2 21* 21* 22  GLUCOSE 144* 134* 117*  BUN 19 20 21*  CREATININE 0.98 1.05* 1.07*  CALCIUM 7.9* 8.0* 7.9*    Liver Function Tests: No results for input(s): AST, ALT, ALKPHOS, BILITOT, PROT, ALBUMIN in the last 168 hours. No results for input(s): LIPASE, AMYLASE in the last 168 hours. No results for input(s): AMMONIA in the last 168 hours.  CBC:  Recent Labs Lab 06/13/15 2205 06/14/15 0249  WBC 14.8* 11.9*  HGB 11.5* 10.5*  HCT 32.7* 29.7*  MCV 111.6* 110.4*  PLT 304 260    Cardiac Enzymes:  Recent Labs Lab 06/13/15 2205 06/14/15 0249 06/14/15 0800  TROPONINI 0.06*  <0.03 <0.03    BNP: Invalid input(s): POCBNP  CBG: No results for input(s): GLUCAP in the last 168 hours.  Microbiology: Results for orders placed or performed during the hospital encounter of 06/13/15  MRSA PCR Screening     Status: None   Collection Time: 06/14/15  2:01 AM  Result Value Ref Range Status   MRSA by PCR NEGATIVE NEGATIVE Final    Comment:        The GeneXpert MRSA Assay (FDA approved for NASAL specimens only), is one component of a comprehensive MRSA colonization surveillance program. It is not intended to diagnose MRSA infection nor to guide or monitor treatment for MRSA infections.     Coagulation Studies: No results for input(s): LABPROT, INR in the last 72 hours.  Urinalysis:  Recent Labs  06/14/15 0053  COLORURINE AMBER*  LABSPEC 1.012  PHURINE 5.0  GLUCOSEU NEGATIVE  HGBUR 1+*  BILIRUBINUR NEGATIVE  KETONESUR NEGATIVE  PROTEINUR NEGATIVE  NITRITE NEGATIVE  LEUKOCYTESUR 3+*      Imaging: Dg Chest 2 View  06/13/2015  CLINICAL DATA:  Shortness of breath. Asthma. COPD. Swelling of the feet and legs. EXAM: CHEST  2 VIEW COMPARISON:  03/23/2013 FINDINGS: Indistinct pulmonary vasculature, with slight interstitial accentuation in the lung bases. Is is heart size within normal limits. No pleural effusion. Mild bilateral airway thickening. IMPRESSION: 1. Airway thickening is present, suggesting bronchitis or reactive airways disease. There is faint associated interstitial accentuation in the lung bases. No overt airspace opacity. Electronically Signed   By: Gaylyn Rong M.D.   On: 06/13/2015 22:34      Assessment & Plan: Pt is a 49 y.o. female with a PMHx of COPD, gastric ulcer, osteoarthritis, tobacco abuse, who was admitted to  [facility] on 06/13/2015 for evaluation of shortness of breath and cough.  1.  Hyponatremia. 2.  Oral thrush. 3.  Urinary tract infection  Plan:  We are asked evaluate the patient for severe hyponatremia. Initial  serum sodium was 114 and did drop slightly to 112. Most recent serum sodium was up to 115. We have reviewed urine labs. Urine sodium was very low at less than 10 which indicates some level of underlying volume depletion. History supports this is a patient reports that she's had poor by mouth intake and nausea and vomiting at home. Therefore no urgent need to administer hypertonic saline as the patient does appear to be largely asymptomatic. We will start the patient on 0.9 normal saline at 100 cc per hour. Follow serum sodiums every 4 hours. Patient was noted as having oral thrush. HIV screen was negative however. She also appears to have a urinary tract infection that she has pyuria and hematuria. Patient has been started on Levaquin for this issue. Thank you for consultation. Further plan as patient progresses.

## 2015-06-14 NOTE — Progress Notes (Signed)
Pt HA1C is 6.2 and past two blood glucose 201 and 195. Called Dr. Anne HahnWillis to inquire about monitoring FSBS. MD will put in a sliding scale.

## 2015-06-14 NOTE — Progress Notes (Signed)
*  PRELIMINARY RESULTS* Echocardiogram 2D Echocardiogram has been performed.  Erica [name] 06/14/2015, 12:10 PM

## 2015-06-14 NOTE — Progress Notes (Signed)
MEDICATION RELATED CONSULT NOTE - INITIAL   Pharmacy Consult for hypertonic saline monitoring Indication: hyponatremia  Allergies  Allergen Reactions  . Tramadol Other (See Comments)    Heart palpatations  . Penicillins Rash    Patient Measurements: Height: 5\' 8"  (172.7 cm) Weight: 204 lb 9.4 oz (92.8 kg) IBW/kg (Calculated) : 63.9 Adjusted Body Weight:   Vital Signs: Temp: 98.8 F (37.1 C) (11/26 0226) Temp Source: Oral (11/26 0226) BP: 109/51 mmHg (11/26 0600) Pulse Rate: 82 (11/26 0600) Intake/Output from previous day:   Intake/Output from this shift:    Labs:  Recent Labs  06/13/15 2205 06/14/15 0053 06/14/15 0136 06/14/15 0249 06/14/15 0546  WBC 14.8*  --   --  11.9*  --   HGB 11.5*  --   --  10.5*  --   HCT 32.7*  --   --  29.7*  --   PLT 304  --   --  260  --   CREATININE 0.89  --  0.96 0.98 1.05*  LABCREA  --  138  --   --   --    Estimated Creatinine Clearance: 76.4 mL/min (by C-G formula based on Cr of 1.05).   Microbiology: Recent Results (from the past 720 hour(s))  MRSA PCR Screening     Status: None   Collection Time: 06/14/15  2:01 AM  Result Value Ref Range Status   MRSA by PCR NEGATIVE NEGATIVE Final    Comment:        The GeneXpert MRSA Assay (FDA approved for NASAL specimens only), is one component of a comprehensive MRSA colonization surveillance program. It is not intended to diagnose MRSA infection nor to guide or monitor treatment for MRSA infections.     Medical History: Past Medical History  Diagnosis Date  . Arthritis   . COPD (chronic obstructive pulmonary disease) (HCC)   . Gastric ulcer     Medications:  Infusions:  . sodium chloride (hypertonic)      Assessment: 49 yof cc SOB being worked up for SOB/chest pain presents with serum sodium 112 mEq/L. Starting hypertonic saline.  Goal of Therapy:  1. Increase serum sodium 4 to 6 mEq/L as soon as possible 2. Do not exceed increase of 8 mEq/L per day  Plan:   Sodium chloride 3% started at 50 mL/hr, BMP every 2 hours. Predicted rate of correction 8.5 mEq/L, will likely need to decrease rate after first few hours. Pharmacy will continue to follow.  Carola FrostNathan A Zephaniah Enyeart, Pharm.D., BCPS Clinical Pharmacist 06/14/2015,6:30 AM

## 2015-06-14 NOTE — Progress Notes (Signed)
Spoke with Dr. Teodoro SprayMunsor.  Informed of Na level.  Increasing NS to 125/hr.

## 2015-06-14 NOTE — Progress Notes (Signed)
eLink Physician-Brief Progress Note Patient Name: Erica AsalDonna A Schmutz DOB: 23-Oct-1965 MRN: [id number]   Date of Service  06/14/2015  HPI/Events of Note  [age] F with PMH sign for COPD presenting with c/o of SOB and chest tightness.  Admitted for ongoing evaluation of potential CHF and unstable angina.  Also found to have a UTI and hyponatremia.   Currently the patient is HD stable in NAD with sats of 99%.  eICU Interventions  Plan of care per primary team Continue [facility]     Intervention Category Evaluation Type: New Patient Evaluation  DETERDING,ELIZABETH 06/14/2015, 2:11 AM

## 2015-06-14 NOTE — ED Provider Notes (Signed)
[facility] Emergency Department Provider Note  REMINDER - THIS NOTE IS NOT A FINAL MEDICAL RECORD UNTIL IT IS SIGNED. UNTIL THEN, THE CONTENT BELOW MAY REFLECT INFORMATION FROM A DOCUMENTATION TEMPLATE, NOT THE ACTUAL PATIENT VISIT. ____________________________________________  Time seen: Approximately 12:35 AM  I have reviewed the triage vital signs and the nursing notes.   HISTORY  Chief Complaint Shortness of Breath    HPI Erica [name] is a 49 y.o. female 's for evaluation of increasing shortness of breath for about the last week, also some slight increased generalized weakness. She reports she also had increased swelling in her legs been slowly worsening over about the last 2 months. Does have a history of COPD, but is run out of inhalers. She is currently not taking any medications  She denies being in pain. She does report mild feeling of shortness of breath that seems to be slightly worsening over the last week. She does occasionally have wheezing, and is been having a dry nonproductive cough without fever.  No history congestive heart failure or previous swelling.  Past Medical History  Diagnosis Date  . Arthritis   . COPD (chronic obstructive pulmonary disease) (HCC)   . Gastric ulcer     There are no active problems to display for this patient.   Past Surgical History  Procedure Laterality Date  . Knee surgery Left     8 knee surgeries    Current Outpatient Rx  Name  Route  Sig  Dispense  Refill  . albuterol (PROVENTIL HFA;VENTOLIN HFA) 108 (90 BASE) MCG/ACT inhaler   Inhalation   Inhale 2 puffs into the lungs every 6 (six) hours as needed for wheezing or shortness of breath.         . beclomethasone (QVAR) 40 MCG/ACT inhaler   Inhalation   Inhale 1 puff into the lungs 2 (two) times daily.           Allergies Tramadol and Penicillins  No family history on file.  Social History Social History  Substance Use Topics  .  Smoking status: Current Every Day Smoker -- 1.50 packs/day for 36 years    Types: Cigarettes  . Smokeless tobacco: None  . Alcohol Use: Yes     Comment: occasionaly    Review of Systems Constitutional: No fever/chills Eyes: No visual changes. ENT: No sore throat. Cardiovascular: Denies chest pain. Respiratory: See history of present illness Gastrointestinal: No abdominal pain.  No nausea, no vomiting.  No diarrhea.  No constipation. Genitourinary: Negative for dysuria. Musculoskeletal: Negative for back pain. Skin: Negative for rash. Neurological: Negative for headaches, focal weakness or numbness.  10-point ROS otherwise negative.  ____________________________________________   PHYSICAL EXAM:  VITAL SIGNS: ED Triage Vitals  Enc Vitals Group     BP 06/13/15 2200 115/46 mmHg     Pulse Rate 06/13/15 2142 90     Resp 06/13/15 2142 26     Temp 06/13/15 2142 98.6 F (37 C)     Temp src --      SpO2 06/13/15 2142 98 %     Weight 06/13/15 2142 200 lb (90.719 kg)     Height 06/13/15 2142  (1.727 m)     Head Cir --      Peak Flow --      Pain Score 06/13/15 2323 10     Pain Loc --      Pain Edu? --      Excl. in GC? --  Constitutional: Alert and oriented. Well appearing except for mild shortness of breath. Eyes: Conjunctivae are normal. PERRL. EOMI. Head: Atraumatic. Nose: No congestion/rhinnorhea. Mouth/Throat: Mucous membranes are moist.  Oropharynx non-erythematous. Neck: No stridor.   Cardiovascular: Normal rate, regular rhythm. Grossly normal heart sounds.  Good peripheral circulation. Respiratory: Mild increased work of breathing, still able to speak in full sentences. Minimal use of accessory muscles. Lung sounds relatively clear without wheezing noted though there is a mild increased and expiratory phase. Gastrointestinal: Soft and nontender. No distention. No abdominal bruits. No CVA tenderness. Musculoskeletal: 2+ lower extremity edema bilaterally No joint  effusions. Neurologic:  Normal speech and language. No gross focal neurologic deficits are appreciated. No gait instability. Skin:  Skin is warm, dry and intact. No rash noted. Psychiatric: Mood and affect are normal. Speech and behavior are normal.  ____________________________________________   LABS (all labs ordered are listed, but only abnormal results are displayed)  Labs Reviewed  BASIC METABOLIC PANEL - Abnormal; Notable for the following:    Sodium 112 (*)    Chloride 80 (*)    CO2 19 (*)    Glucose, Bld 148 (*)    Calcium 8.2 (*)    All other components within normal limits  CBC - Abnormal; Notable for the following:    WBC 14.8 (*)    RBC 2.93 (*)    Hemoglobin 11.5 (*)    HCT 32.7 (*)    MCV 111.6 (*)    MCH 39.2 (*)    RDW 19.2 (*)    All other components within normal limits  TROPONIN I - Abnormal; Notable for the following:    Troponin I 0.06 (*)    All other components within normal limits  BRAIN NATRIURETIC PEPTIDE  URINALYSIS COMPLETEWITH MICROSCOPIC ([facility] ONLY)  SODIUM, URINE, RANDOM  CREATININE, URINE, RANDOM  OSMOLALITY, URINE  TSH   ____________________________________________  EKG  Reviewed and interpreted by me at 2150 ED ECG REPORT I, Nyah Shepherd, the attending physician, personally viewed and interpreted this ECG.  Date: 06/14/2015 EKG Time: 2150 Rate: 90 Rhythm: normal sinus rhythm QRS Axis: normal Intervals: normal ST/T Wave abnormalities: normal Conduction Disutrbances: none Narrative Interpretation: unremarkable  ____________________________________________  RADIOLOGY  DG Chest 2 View (Final result) Result time: 06/13/15 22:34:30   Final result by Rad Results In Interface (06/13/15 22:34:30)   Narrative:   CLINICAL DATA: Shortness of breath. Asthma. COPD. Swelling of the feet and legs.  EXAM: CHEST 2 VIEW  COMPARISON: 03/23/2013  FINDINGS: Indistinct pulmonary vasculature, with slight interstitial accentuation  in the lung bases. Is is heart size within normal limits. No pleural effusion.  Mild bilateral airway thickening.  IMPRESSION: 1. Airway thickening is present, suggesting bronchitis or reactive airways disease. There is faint associated interstitial accentuation in the lung bases. No overt airspace opacity.    ____________________________________________   PROCEDURES  Procedure(s) performed: None  Critical Care performed: No  ____________________________________________   INITIAL IMPRESSION / ASSESSMENT AND PLAN / ED COURSE  Pertinent labs & imaging results that were available during my care of the patient were reviewed by me and considered in my medical decision making (see chart for details).  Patient presents for evaluation of increasing dyspnea and lower extremity edema. She does have mild increased work of breathing and endorses slight improvement with nebulizer therapy. Differential diagnosis certainly included acute cardiac though no acute chest pain, COPD, congestive heart failure. Pacer sodium 112 and lower extremity edema, appears that we will fluid restrict her and she may  very well be in new onset of heart failure. Her sodium level is critical, and I do not wish to give her any additional volume which may actually drive her sodium down further. We'll fluid restrict her, admitted to the hospital for further workup.  Patient and husband in agreement with the plan. Plan discussed Dr. Anne HahnWillis of the hospitalist service. ____________________________________________   FINAL CLINICAL IMPRESSION(S) / ED DIAGNOSES  Final diagnoses:  Hyponatremia  Hypervolemia, unspecified hypervolemia type  COPD, mild (HCC)      Sharyn CreamerMark Kynzlee Hucker, MD 06/14/15 0045

## 2015-06-14 NOTE — Consult Note (Signed)
Cardiology Consultation Note  Patient ID: Erica [name], MRN: [id number], DOB/AGE: [birth year] 49 y.o. Admit date: 06/13/2015   Date of Consult: 06/14/2015 Primary Physician: [facility] Primary Cardiologist: Mariah Milling, tim  Chief Complaint: SOB Reason for Consult: leg edema, SOB, hyponatremia  HPI: 49 y.o. female with h/o smoking, COPD, on inhalers, medication noncompliance secondary to financial and insurance issues, history of thrush, chronic back pain who presents to the emergency room with shortness of breath for the past week.  She reports symptoms of shortness of breath and cough for 1 week no symptoms started several weeks ago, worse recently. She denies any significant sputum production, dry cough. She does report having leg edema for the past several months. Seems to be bad when she wakes up, worse through the day. Denies having significant PND or orthopnea. With her coughing and shortness of breath, has had some chest tightness. Symptoms becoming severe through the past week, decided to come to the emergency room.  Workup in the ER showed sodium around 112, abnormal troponin EKG showing normal sinus rhythm with no significant changes concerning for active ischemia There was concern of ischemia in the emergency room and she was given aspirin and loading dose Plavix placed on fluid restrictions, started on antibiotic, COPD regimen   Past Medical History  Diagnosis Date  . Arthritis   . COPD (chronic obstructive pulmonary disease) (HCC)   . Gastric ulcer   . OSA on CPAP no ins - no cpap machine for the last 4 years  . Asthma   . Hypertension       Most Recent Cardiac Studies: Echo this admission showing normal ejection fraction, at least moderately elevated right heart pressures, right ventricular systolic pressure greater than 50    Surgical History:  Past Surgical History  Procedure Laterality Date  . Knee surgery Left     8 knee surgeries     Home  Meds: Prior to Admission medications   Medication Sig Start Date End Date Taking? Authorizing Provider  albuterol (PROVENTIL HFA;VENTOLIN HFA) 108 (90 BASE) MCG/ACT inhaler Inhale 2 puffs into the lungs every 6 (six) hours as needed for wheezing or shortness of breath.   Yes Historical Provider, MD  beclomethasone (QVAR) 40 MCG/ACT inhaler Inhale 1 puff into the lungs 2 (two) times daily.    Historical Provider, MD    Inpatient Medications:  . aspirin EC  81 mg Oral Daily  . atorvastatin  20 mg Oral q1800  . budesonide  2 mL Inhalation BID  . clopidogrel  75 mg Oral Q breakfast  . enoxaparin (LOVENOX) injection  1 mg/kg Subcutaneous BID  . ipratropium-albuterol  3 mL Nebulization Q6H  . levofloxacin  500 mg Oral QHS  . magic mouthwash  10 mL Oral TID  . nicotine  21 mg Transdermal Daily  . nitroGLYCERIN  0.5 inch Topical 4 times per day  . predniSONE  40 mg Oral Q breakfast   . sodium chloride 100 mL/hr at 06/14/15 0957  . sodium chloride (hypertonic) Stopped (06/14/15 0945)    Allergies:  Allergies  Allergen Reactions  . Tramadol Other (See Comments)    Heart palpatations  . Penicillins Rash    Social History   Social History  . Marital Status: Divorced    Spouse Name: N/A  . Number of Children: N/A  . Years of Education: N/A   Occupational History  . Not on file.   Social History Main Topics  . Smoking status: Current Every Day  Smoker -- 1.50 packs/day for 36 years    Types: Cigarettes  . Smokeless tobacco: Not on file  . Alcohol Use: 0.0 oz/week    0 Standard drinks or equivalent per week     Comment: occasionaly  . Drug Use: No  . Sexual Activity: Not on file   Other Topics Concern  . Not on file   Social History Narrative     Family History  Problem Relation Age of Onset  . Lung cancer Father   . Breast cancer Mother   . Diabetes Maternal Grandmother      Review of Systems:  Review of Systems  Constitutional: Positive for malaise/fatigue.   Respiratory: Positive for cough, shortness of breath and wheezing.   Cardiovascular: Negative.   Gastrointestinal: Negative.   Musculoskeletal: Negative.   Neurological: Positive for weakness.  Psychiatric/Behavioral: Negative.   All other systems reviewed and are negative.  All other systems were reviewed and negative.   Labs:  Recent Labs  06/13/15 2205 06/14/15 0249 06/14/15 0800  TROPONINI 0.06* <0.03 <0.03   Lab Results  Component Value Date   WBC 11.9* 06/14/2015   HGB 10.5* 06/14/2015   HCT 29.7* 06/14/2015   MCV 110.4* 06/14/2015   PLT 260 06/14/2015    Recent Labs Lab 06/14/15 0800  NA 115*  K 4.1  CL 83*  CO2 22  BUN 21*  CREATININE 1.07*  CALCIUM 7.9*  GLUCOSE 117*   Lab Results  Component Value Date   CHOL 177 06/14/2015   HDL 25* 06/14/2015   LDLCALC 113* 06/14/2015   TRIG 193* 06/14/2015   No results found for: DDIMER  Radiology/Studies:  Dg Chest 2 View  06/13/2015  . IMPRESSION: 1. Airway thickening is present, suggesting bronchitis or reactive airways disease. There is faint associated interstitial accentuation in the lung bases. No overt airspace opacity. Electronically Signed   By: Gaylyn Rong M.D.   On: 06/13/2015 22:34   Ct Angio Chest Pe W/cm &/or Wo Cm  06/14/2015   IMPRESSION: 1. No evidence acute pulmonary embolism. 2. Ground-glass opacities in the periphery of the LEFT upper lobe suggest mild pulmonary infection. 3. Mild mediastinal and hilar lymphadenopathy is likely reactive. 4. Interval enlargement of 2 small RIGHT upper lobe pulmonary nodules. 5. With the findings of infection, adenopathy, and potentially enlarging nodules coupled with smoking history, recommend follow-up CT without contrast in 3 months to re-evaluate. Electronically Signed   By: Genevive Bi M.D.   On: 06/14/2015 11:39    EKG: Telemetry showing normal sinus rhythm EKG on admission shows normal sinus rhythm with no significant ST or T-wave  changes  Weights: Filed Weights   06/13/15 2142 06/14/15 0158  Weight: 200 lb (90.719 kg) 204 lb 9.4 oz (92.8 kg)     Physical Exam :Blood pressure 107/46, pulse 85, temperature 98.8 F (37.1 C), temperature source Oral, resp. rate 27, height  (1.727 m), weight 204 lb 9.4 oz (92.8 kg), last menstrual period 03/13/2015, SpO2 95 %. Body mass index is 31.11 kg/(m^2). General: Well developed, well nourished, in no acute distress. Head: Normocephalic, atraumatic, sclera non-icteric, no xanthomas, nares are without discharge.  Neck: Negative for carotid bruits. JVD not elevated. Lungs: Decreased breath sounds throughout, Rales bilaterally Heart: RRR with S1 S2. No murmurs, rubs, or gallops appreciated. Abdomen: Soft, non-tender, non-distended with normoactive bowel sounds. No hepatomegaly. No rebound/guarding. No obvious abdominal masses. Msk:  Strength and tone appear normal for age. Extremities: No clubbing or cyanosis. Minimal pitting edema  around the ankles, Distal pedal pulses are 2+ and equal bilaterally. Neuro: Alert and oriented X 3. No facial asymmetry. No focal deficit. Moves all extremities spontaneously. Psych:  Responds to questions appropriately with a normal affect.    Assessment and Plan:   1. Respiratory distress/shortness of breath Cough, general malaise, getting worse over the past week or more Unable to exclude bronchitis or viral upper respiratory infection CT scan of the chest suggesting infection in the left upper lobe --Also need to consider acute on chronic diastolic CHF with moderately elevated right heart pressures estimated around 56 --Currently on antibiotic, COPD regimen Given her elevated right heart pressures, may need to closely monitor IV fluids being given for low sodium -- Once her symptoms have improved, could consider Myoview to rule out ischemia High risk of underlying coronary artery disease given long history of smoking  2) COPD,  exacerbation Managed by pulmonary, Antibiotic, nebulizer If no improvement of symptoms, may need steroids  3) thrush She reports long history of thrush over the past several months, previously treated with swish and swallow as well as nystatin but never got better. May have started with steroid inhaler,QVAR Will defer management to medical team. May need a longer course of treatment given previously seem to come back  4) chronic back pain Patient requesting to get out of bed and sit in recliner Should be stable to move out of bed with assistance  5) acute on chronic diastolic CHF No PE on CT scan Elevated right heart pressures likely secondary to underlying lung disease, diastolic dysfunction May need to restrict IV fluids, currently being given for low sodium  6) hyponatremia Etiology unclear, she does admit to poor diet given her thrush Not on any medications that should have contributed to low sodium such as HCTZ Started on normal saline with small improvement in numbers, We'll need to monitor fluid intake closely given elevated right heart pressures If no improvement, could use samsca  7) leg edema Not particularly impressive on exam but patient reports having leg swelling for several months This may be consistent with elevated right heart pressures Again we may want to be cautious with IV fluids   Signed, Dossie Arbourim Boubacar Lerette, MD [facility] 06/14/2015, 1:12 PM

## 2015-06-14 NOTE — Progress Notes (Signed)
MEDICATION RELATED CONSULT NOTE - Follow Up   Pharmacy Consult for hypertonic saline monitoring Indication: hyponatremia   Patient Measurements: Height: 5\' 8"  (172.7 cm) Weight: 204 lb 9.4 oz (92.8 kg) IBW/kg (Calculated) : 63.9  Vital Signs: BP: 107/46 mmHg (11/26 1100) Pulse Rate: 85 (11/26 1100)  Labs:  Recent Labs  06/13/15 2205 06/14/15 0053  06/14/15 0249 06/14/15 0546 06/14/15 0800  WBC 14.8*  --   --  11.9*  --   --   HGB 11.5*  --   --  10.5*  --   --   HCT 32.7*  --   --  29.7*  --   --   PLT 304  --   --  260  --   --   CREATININE 0.89  --   < > 0.98 1.05* 1.07*  LABCREA  --  138  --   --   --   --   < > = values in this interval not displayed. Estimated Creatinine Clearance: 75 mL/min (by C-G formula based on Cr of 1.07).   Medical History: Past Medical History  Diagnosis Date  . Arthritis   . COPD (chronic obstructive pulmonary disease) (HCC)   . Gastric ulcer   . OSA on CPAP no ins - no cpap machine for the last 4 years  . Asthma   . Hypertension     Medications:  Infusions:  . sodium chloride 100 mL/hr at 06/14/15 0957  . sodium chloride (hypertonic) Stopped (06/14/15 0945)    Assessment: 49 yof cc SOB being worked up for SOB/chest pain presents with serum sodium 112 mEq/L. Starting hypertonic saline.  1/26 0800 sodium 115 11/26 1358 sodium 115  Goal of Therapy:  1. Increase serum sodium 4 to 6 mEq/L as soon as possible 2. Do not exceed increase of 8 mEq/L per day  Plan:  Sodium chloride 3% started at 50 mL/hr 11/26 @ 0615. Increase 3 mEq. Sodium levels are being checked every 4 hours. Pharmacy will continue to follow.  Cher NakaiSheema Ashleyann Shoun, PharmD Pharmacy Resident 06/14/2015 3:00 PM

## 2015-06-14 NOTE — Consult Note (Addendum)
PULMONARY / CRITICAL CARE MEDICINE   Name: Erica [name] MRN: [id number] DOB: 07-31-1965    ADMISSION DATE:  06/13/2015 CONSULTATION DATE:  06/14/15  REFERRING MD :  Dr. Theador Hawthorne   CHIEF COMPLAINT:    shortness of breath and cough   HISTORY OF PRESENT ILLNESS    49 y.o. female with a known history of COPD, arthritis presented to the emergency room with shortness of breath and chest tightness. Patient has increased shortness of breath for the last 2-3 weeks with productive cough (yellow, thick white). She has been retaining fluid in the legs for the last 2 months. Has history of orthopnea. No history of any congestive heart failure. Patient chest tightness gets worsened whenever she is short of breath. No history of any prior cardiac workup. Patient was evaluated in the emergency room her first set of troponin was abnormal and sodium was low around 112. No history of any seizures. No history of any tingling or numbness. No history of any confusion.  Smoker for the past 36 years, previously 4ppd, over the last 2 year 1-1.5ppd. Patient stated that she has lost insurance and has ran out of inhalers about 3-4 weeks ago. She also states that she has a history of obstructive sleep apnea on CPAP, due to insurance issues lost her CPAP machine about 4 years ago.    SIGNIFICANT EVENTS     PAST MEDICAL HISTORY    :  Past Medical History  Diagnosis Date  . Arthritis   . COPD (chronic obstructive pulmonary disease) (HCC)   . Gastric ulcer    Past Surgical History  Procedure Laterality Date  . Knee surgery Left     8 knee surgeries   Prior to Admission medications   Medication Sig Start Date End Date Taking? Authorizing Provider  albuterol (PROVENTIL HFA;VENTOLIN HFA) 108 (90 BASE) MCG/ACT inhaler Inhale 2 puffs into the lungs every 6 (six) hours as needed for wheezing or shortness of breath.   Yes Historical Provider, MD  beclomethasone (QVAR) 40 MCG/ACT inhaler Inhale 1 puff into the  lungs 2 (two) times daily.    Historical Provider, MD   Allergies  Allergen Reactions  . Tramadol Other (See Comments)    Heart palpatations  . Penicillins Rash     FAMILY HISTORY   Family History  Problem Relation Age of Onset  . Lung cancer Father   . Breast cancer Mother   . Diabetes Maternal Grandmother       SOCIAL HISTORY    reports that she has been smoking Cigarettes.  She has a 54 pack-year smoking history. She does not have any smokeless tobacco history on file. She reports that she drinks alcohol. She reports that she does not use illicit drugs.  Review of Systems  Constitutional: Negative for fever, chills, weight loss and malaise/fatigue.  HENT: Positive for sore throat. Negative for hearing loss.        Thrush  Eyes: Negative for blurred vision, double vision and photophobia.  Respiratory: Positive for cough, sputum production, shortness of breath and wheezing. Negative for hemoptysis.   Cardiovascular: Negative for chest pain.  Gastrointestinal: Negative for heartburn, nausea, vomiting and abdominal pain.  Genitourinary: Negative for dysuria.  Musculoskeletal: Negative for myalgias.  Skin: Negative for itching and rash.  Neurological: Positive for weakness. Negative for dizziness, tingling and headaches.  Psychiatric/Behavioral: The patient has insomnia.       VITAL SIGNS    Temp:  [98.6 F (37 C)-98.8 F (37.1 C)] 98.8  F (37.1 C) (11/26 0226) Pulse Rate:  [82-93] 82 (11/26 0600) Resp:  [15-34] 27 (11/26 0600) BP: (105-124)/(43-54) 109/51 mmHg (11/26 0600) SpO2:  [91 %-100 %] 94 % (11/26 0600) Weight:  [200 lb (90.719 kg)-204 lb 9.4 oz (92.8 kg)] 204 lb 9.4 oz (92.8 kg) (11/26 0158) HEMODYNAMICS:   VENTILATOR SETTINGS:   INTAKE / OUTPUT: No intake or output data in the 24 hours ending 06/14/15 0800     PHYSICAL EXAM   Physical Exam  Constitutional: She is oriented to person, place, and time. She appears well-developed and  well-nourished.  HENT:  Head: Normocephalic and atraumatic.  Right Ear: External ear normal.  Thrush on posterior oropharynx and posterior tongue  Eyes: Conjunctivae are normal. Pupils are equal, round, and reactive to light.  Neck: Normal range of motion. Neck supple.  Cardiovascular: Normal rate, regular rhythm, normal heart sounds and intact distal pulses.   Pulmonary/Chest: No respiratory distress. She has wheezes. She has rales.  No resp distress Fine basilar rales and crackles  Abdominal: Soft.  Musculoskeletal: Normal range of motion. She exhibits edema.  1+ Pitting edema to the mid leg - bilaterally  Neurological: She is alert and oriented to person, place, and time.  Skin: Skin is warm and dry.  Psychiatric: She has a normal mood and affect.  Nursing note and vitals reviewed.      LABS   LABS:  CBC  Recent Labs Lab 06/13/15 2205 06/14/15 0249  WBC 14.8* 11.9*  HGB 11.5* 10.5*  HCT 32.7* 29.7*  PLT 304 260   Coag's No results for input(s): APTT, INR in the last 168 hours. BMET  Recent Labs Lab 06/14/15 0136 06/14/15 0249 06/14/15 0546  NA 114* 112* 114*  K 3.8 4.0 4.5  CL 82* 80* 82*  CO2 20* 21* 21*  BUN 19 19 20   CREATININE 0.96 0.98 1.05*  GLUCOSE 143* 144* 134*   Electrolytes  Recent Labs Lab 06/14/15 0136 06/14/15 0249 06/14/15 0546  CALCIUM 8.1* 7.9* 8.0*   Sepsis Markers No results for input(s): LATICACIDVEN, PROCALCITON, O2SATVEN in the last 168 hours. ABG No results for input(s): PHART, PCO2ART, PO2ART in the last 168 hours. Liver Enzymes No results for input(s): AST, ALT, ALKPHOS, BILITOT, ALBUMIN in the last 168 hours. Cardiac Enzymes  Recent Labs Lab 06/13/15 2205 06/14/15 0249  TROPONINI 0.06* <0.03   Glucose No results for input(s): GLUCAP in the last 168 hours.   Recent Results (from the past 240 hour(s))  MRSA PCR Screening     Status: None   Collection Time: 06/14/15  2:01 AM  Result Value Ref Range Status    MRSA by PCR NEGATIVE NEGATIVE Final    Comment:        The GeneXpert MRSA Assay (FDA approved for NASAL specimens only), is one component of a comprehensive MRSA colonization surveillance program. It is not intended to diagnose MRSA infection nor to guide or monitor treatment for MRSA infections.      Current facility-administered medications:  .  acetaminophen (TYLENOL) tablet 650 mg, 650 mg, Oral, Q4H PRN, Ihor AustinPavan Pyreddy, MD, 650 mg at 06/14/15 0656 .  albuterol (PROVENTIL) (2.5 MG/3ML) 0.083% nebulizer solution 3 mL, 3 mL, Inhalation, Q6H PRN, Ihor AustinPavan Pyreddy, MD .  aspirin EC tablet 81 mg, 81 mg, Oral, Daily, Pavan Pyreddy, MD .  atorvastatin (LIPITOR) tablet 20 mg, 20 mg, Oral, q1800, Pavan Pyreddy, MD .  budesonide (PULMICORT) nebulizer solution 0.25 mg, 2 mL, Inhalation, BID, Ihor AustinPavan Pyreddy, MD .  clopidogrel (PLAVIX) tablet 75 mg, 75 mg, Oral, Q breakfast, Pavan Pyreddy, MD .  enoxaparin (LOVENOX) injection 95 mg, 1 mg/kg, Subcutaneous, BID, Ihor Austin, MD, 95 mg at 06/14/15 0259 .  levofloxacin (LEVAQUIN) tablet 500 mg, 500 mg, Oral, QHS, Pavan Pyreddy, MD .  nitroGLYCERIN (NITROGLYN) 2 % ointment 0.5 inch, 0.5 inch, Topical, 4 times per day, Ihor Austin, MD, 0.5 inch at 06/14/15 0248 .  nitroGLYCERIN (NITROSTAT) SL tablet 0.4 mg, 0.4 mg, Sublingual, Q5 Min x 3 PRN, Pavan Pyreddy, MD .  ondansetron (ZOFRAN) injection 4 mg, 4 mg, Intravenous, Q6H PRN, Pavan Pyreddy, MD .  sodium chloride (hypertonic) 3 % solution, , Intravenous, Continuous, Pavan Pyreddy, MD, Last Rate: 50 mL/hr at 06/14/15 0635, 50 mL/hr at 06/14/15 0635  IMAGING    Dg Chest 2 View  06/13/2015  CLINICAL DATA:  Shortness of breath. Asthma. COPD. Swelling of the feet and legs. EXAM: CHEST  2 VIEW COMPARISON:  03/23/2013 FINDINGS: Indistinct pulmonary vasculature, with slight interstitial accentuation in the lung bases. Is is heart size within normal limits. No pleural effusion. Mild bilateral airway  thickening. IMPRESSION: 1. Airway thickening is present, suggesting bronchitis or reactive airways disease. There is faint associated interstitial accentuation in the lung bases. No overt airspace opacity. Electronically Signed   By: Gaylyn Rong M.D.   On: 06/13/2015 22:34      Indwelling Urinary Catheter continued, requirement due to   Reason to continue Indwelling Urinary Catheter for strict Intake/Output monitoring for hemodynamic instability   Central Line continued, requirement due to   Reason to continue [employer] Monitoring of central venous pressure or other hemodynamic parameters   Ventilator continued, requirement due to, resp failure    Ventilator Sedation RASS 0 to -2   Cultures: BCx2 11/26>> UC  Sputum 11/26>>  Antibiotics: levaquin 11/26>>   Lines:   ASSESSMENT/PLAN  49 year old female past medical history of obstructive sleep apnea, COPD, tobacco abuse, admitted for COPD exacerbation and acute hyponatremia  PULMONARY AECOPD SOB OSA ? CAP Tobacco Abuse P: - Start schedule nebulizers, Pulmicort, steroids. -Continue Levaquin for possible bronchitis/ -CT chest PE protocol given shortness of breath, leg swelling and history of tobacco abuse with PE and hyponatremia now -Incentive spirometry -BiPAP at night. Previously had a CPAP machine but lost it about 4 years ago due to insurance issues -2-D echo for lower extremity edema, shortness of breath -Patient counseled extensively on tobacco cessation for greater than 12 minutes  CARDIOVASCULAR Chest pain Lower extremity edema Dyspnea P: -Trend cardiac enzymes -2-D echo -Cardiology consult  RENAL A:   Hyponatremia Mild creatinine elevation P:   -1 year ago sodium was in the 130s, d\c hypertonic saline , cont with mild fluid restriction.  Patient with normal neuro status.  -Fluid restriction -Monitor electrolytes closely -Patient has been moderately ill over the past 2-3 weeks,  underlying pneumonia or malignancy can cause profound hyponatremia. Currently patient mentating well with no neurological disturbances with her current sodium level. - q4hr Na check, q12hr BMP -Nephrology following - appreciate assistance  GASTROINTESTINAL A:  History of gastric ulcer P:   -Continue with PPI  HEMATOLOGIC -Follow CBC  INFECTIOUS Bronchitis/?CAP Thrush -Continue Levaquin -Follow-up sputum culture -Follow-up blood culture -magic mouth wash -check HIV   ENDOCRINE -SSI if needed  NEUROLOGIC RASS goal: 0   I have personally obtained a history, examined the patient, evaluated laboratory and imaging results, formulated the assessment and plan and placed orders.  The Patient requires high complexity decision making for  assessment and support, frequent evaluation and titration of therapies, application of advanced monitoring technologies and extensive interpretation of multiple databases. Critical Care Time devoted to patient care services described in this note is 55 minutes.   Overall, patient is critically ill, prognosis is guarded. Patient at high risk for cardiac arrest and death.   Stephanie Acre, MD [facility] Pager [phone] (please enter 7-digits) On Call Pager - [phone] (please enter 7-digits)   06/14/2015, 8:00 AM  Note: This note was prepared with Dragon dictation along with smaller phrase technology. Any transcriptional errors that result from this process are unintentional.

## 2015-06-15 ENCOUNTER — Inpatient Hospital Stay: Admit: 2015-06-15 | Payer: 59

## 2015-06-15 ENCOUNTER — Inpatient Hospital Stay: Payer: 59

## 2015-06-15 DIAGNOSIS — R05 Cough: Secondary | ICD-10-CM

## 2015-06-15 DIAGNOSIS — I272 Pulmonary hypertension, unspecified: Secondary | ICD-10-CM | POA: Insufficient documentation

## 2015-06-15 DIAGNOSIS — N289 Disorder of kidney and ureter, unspecified: Secondary | ICD-10-CM | POA: Insufficient documentation

## 2015-06-15 DIAGNOSIS — I5033 Acute on chronic diastolic (congestive) heart failure: Secondary | ICD-10-CM | POA: Insufficient documentation

## 2015-06-15 DIAGNOSIS — R0602 Shortness of breath: Secondary | ICD-10-CM

## 2015-06-15 DIAGNOSIS — J440 Chronic obstructive pulmonary disease with acute lower respiratory infection: Secondary | ICD-10-CM

## 2015-06-15 DIAGNOSIS — R911 Solitary pulmonary nodule: Secondary | ICD-10-CM

## 2015-06-15 LAB — BASIC METABOLIC PANEL
ANION GAP: 11 (ref 5–15)
ANION GAP: 9 (ref 5–15)
BUN: 20 mg/dL (ref 6–20)
BUN: 20 mg/dL (ref 6–20)
CALCIUM: 7.9 mg/dL — AB (ref 8.9–10.3)
CHLORIDE: 90 mmol/L — AB (ref 101–111)
CO2: 20 mmol/L — AB (ref 22–32)
CO2: 18 mmol/L — ABNORMAL LOW (ref 22–32)
CREATININE: 1.17 mg/dL — AB (ref 0.44–1.00)
Calcium: 8.1 mg/dL — ABNORMAL LOW (ref 8.9–10.3)
Chloride: 93 mmol/L — ABNORMAL LOW (ref 101–111)
Creatinine, Ser: 0.96 mg/dL (ref 0.44–1.00)
GFR calc non Af Amer: >60 mL/min (ref >60)
GFR, EST NON AFRICAN AMERICAN: 53 mL/min — AB (ref >60)
Glucose, Bld: 112 mg/dL — ABNORMAL HIGH (ref 65–99)
Glucose, Bld: 192 mg/dL — ABNORMAL HIGH (ref 65–99)
POTASSIUM: 3.7 mmol/L (ref 3.5–5.1)
Potassium: 4.9 mmol/L (ref 3.5–5.1)
Sodium: 121 mmol/L — ABNORMAL LOW (ref 135–145)
Sodium: 120 mmol/L — ABNORMAL LOW (ref 135–145)

## 2015-06-15 LAB — SODIUM
SODIUM: 126 mmol/L — AB (ref 135–145)
SODIUM: 125 mmol/L — AB (ref 135–145)
Sodium: 118 mmol/L — CL (ref 135–145)
Sodium: 120 mmol/L — ABNORMAL LOW (ref 135–145)

## 2015-06-15 LAB — CBC
HEMATOCRIT: 29.6 % — AB (ref 35.0–47.0)
HEMOGLOBIN: 10.1 g/dL — AB (ref 12.0–16.0)
MCH: 37.9 pg — ABNORMAL HIGH (ref 26.0–34.0)
MCHC: 34 g/dL (ref 32.0–36.0)
MCV: 111.6 fL — ABNORMAL HIGH (ref 80.0–100.0)
Platelets: 260 10*3/uL (ref 150–440)
RBC: 2.65 MIL/uL — AB (ref 3.80–5.20)
RDW: 19.6 % — ABNORMAL HIGH (ref 11.5–14.5)
WBC: 12.2 10*3/uL — AB (ref 3.6–11.0)

## 2015-06-15 LAB — GLUCOSE, CAPILLARY
GLUCOSE-CAPILLARY: 120 mg/dL — AB (ref 65–99)
GLUCOSE-CAPILLARY: 223 mg/dL — AB (ref 65–99)
GLUCOSE-CAPILLARY: 117 mg/dL — AB (ref 65–99)
Glucose-Capillary: 284 mg/dL — ABNORMAL HIGH (ref 65–99)

## 2015-06-15 MED ORDER — FUROSEMIDE 20 MG PO TABS
20.0000 mg | ORAL_TABLET | Freq: Every day | ORAL | Status: DC
  Administered 2015-06-15 – 2015-06-16 (×2): 20 mg via ORAL
  Filled 2015-06-15 (×2): qty 1

## 2015-06-15 MED ORDER — OXYCODONE-ACETAMINOPHEN 5-325 MG PO TABS
1.0000 | ORAL_TABLET | Freq: Four times a day (QID) | ORAL | Status: DC | PRN
  Administered 2015-06-15 – 2015-06-23 (×26): 1 via ORAL
  Filled 2015-06-15 (×26): qty 1

## 2015-06-15 MED ORDER — POTASSIUM CHLORIDE CRYS ER 20 MEQ PO TBCR
20.0000 meq | EXTENDED_RELEASE_TABLET | Freq: Every day | ORAL | Status: DC
  Administered 2015-06-15 – 2015-06-18 (×4): 20 meq via ORAL
  Filled 2015-06-15 (×4): qty 1

## 2015-06-15 MED ORDER — FUROSEMIDE 10 MG/ML IJ SOLN
20.0000 mg | Freq: Once | INTRAMUSCULAR | Status: AC
  Administered 2015-06-15: 20 mg via INTRAVENOUS
  Filled 2015-06-15: qty 2

## 2015-06-15 MED ORDER — POTASSIUM CHLORIDE CRYS ER 20 MEQ PO TBCR
40.0000 meq | EXTENDED_RELEASE_TABLET | Freq: Once | ORAL | Status: AC
  Administered 2015-06-15: 40 meq via ORAL
  Filled 2015-06-15: qty 2

## 2015-06-15 NOTE — Consult Note (Signed)
PULMONARY / CRITICAL CARE MEDICINE   Name: Erica [name] MRN: [id number] DOB: 1966-01-03    ADMISSION DATE:  06/13/2015 CONSULTATION DATE:  06/14/15  REFERRING MD :  Dr. Theador Hawthorne   CHIEF COMPLAINT:    shortness of breath and cough   HISTORY OF PRESENT ILLNESS    49 y.o. female with a known history of COPD, arthritis presented to the emergency room with shortness of breath and chest tightness. Patient has increased shortness of breath for the last 2-3 weeks with productive cough (yellow, thick white). She has been retaining fluid in the legs for the last 2 months. Has history of orthopnea. No history of any congestive heart failure. Patient chest tightness gets worsened whenever she is short of breath. No history of any prior cardiac workup. Patient was evaluated in the emergency room her first set of troponin was abnormal and sodium was low around 112. No history of any seizures. No history of any tingling or numbness. No history of any confusion.  Smoker for the past 36 years, previously 4ppd, over the last 2 year 1-1.5ppd. Patient stated that she has lost insurance and has ran out of inhalers about 3-4 weeks ago. She also states that she has a history of obstructive sleep apnea on CPAP, due to insurance issues lost her CPAP machine about 4 years ago.  SUBJECTIVE: Improve breathing this morning, sodium was improved to 121, with reinitiation of hypertonic saline, patient still with mild to moderate cough, with mild production. Overall is having improvement in pulmonary status.  SIGNIFICANT EVENTS     PAST MEDICAL HISTORY    :  Past Medical History  Diagnosis Date  . Arthritis   . COPD (chronic obstructive pulmonary disease) (HCC)   . Gastric ulcer   . OSA on CPAP no ins - no cpap machine for the last 4 years  . Asthma   . Hypertension    Past Surgical History  Procedure Laterality Date  . Knee surgery Left     8 knee surgeries   Prior to Admission medications    Medication Sig Start Date End Date Taking? Authorizing Provider  albuterol (PROVENTIL HFA;VENTOLIN HFA) 108 (90 BASE) MCG/ACT inhaler Inhale 2 puffs into the lungs every 6 (six) hours as needed for wheezing or shortness of breath.   Yes Historical Provider, MD  beclomethasone (QVAR) 40 MCG/ACT inhaler Inhale 1 puff into the lungs 2 (two) times daily.    Historical Provider, MD   Allergies  Allergen Reactions  . Tramadol Other (See Comments)    Heart palpatations  . Penicillins Rash     FAMILY HISTORY   Family History  Problem Relation Age of Onset  . Lung cancer Father   . Breast cancer Mother   . Diabetes Maternal Grandmother       SOCIAL HISTORY    reports that she has been smoking Cigarettes.  She has a 54 pack-year smoking history. She does not have any smokeless tobacco history on file. She reports that she drinks alcohol. She reports that she does not use illicit drugs.  Review of Systems  Constitutional: Negative for fever, chills, weight loss and malaise/fatigue.  HENT: Positive for sore throat. Negative for hearing loss.        Thrush  Eyes: Negative for blurred vision, double vision and photophobia.  Respiratory: Positive for cough, sputum production, shortness of breath and wheezing. Negative for hemoptysis.        Improving  Cardiovascular: Negative for chest pain.  Gastrointestinal: Negative  for heartburn, nausea, vomiting and abdominal pain.  Genitourinary: Negative for dysuria.  Musculoskeletal: Negative for myalgias.  Skin: Negative for itching and rash.  Neurological: Positive for weakness. Negative for dizziness, tingling and headaches.  Psychiatric/Behavioral: The patient has insomnia.       VITAL SIGNS    Temp:  [97.5 F (36.4 C)-98.5 F (36.9 C)] 97.7 F (36.5 C) (11/27 0200) Pulse Rate:  [79-105] 93 (11/27 0600) Resp:  [21-36] 29 (11/27 0600) BP: (91-113)/(43-79) 100/43 mmHg (11/27 0600) SpO2:  [91 %-100 %] 96 % (11/27 0844) Weight:   [203 lb 14.8 oz (92.5 kg)] 203 lb 14.8 oz (92.5 kg) (11/27 0618) HEMODYNAMICS:   VENTILATOR SETTINGS:   INTAKE / OUTPUT:  Intake/Output Summary (Last 24 hours) at 06/15/15 0855 Last data filed at 06/15/15 0600  Gross per 24 hour  Intake    750 ml  Output    450 ml  Net    300 ml       PHYSICAL EXAM   Physical Exam  Constitutional: She is oriented to person, place, and time. She appears well-developed and well-nourished.  HENT:  Head: Normocephalic and atraumatic.  Right Ear: External ear normal.  Thrush on posterior oropharynx and posterior tongue  Eyes: Conjunctivae are normal. Pupils are equal, round, and reactive to light.  Neck: Normal range of motion. Neck supple.  Cardiovascular: Normal rate, regular rhythm, normal heart sounds and intact distal pulses.   Pulmonary/Chest: No respiratory distress. She has wheezes. She has rales.  No resp distress Fine basilar rales and crackles  Abdominal: Soft.  Musculoskeletal: Normal range of motion. She exhibits edema.  1+ Pitting edema to the mid leg - bilaterally  Neurological: She is alert and oriented to person, place, and time.  Skin: Skin is warm and dry.  Psychiatric: She has a normal mood and affect.  Nursing note and vitals reviewed.      LABS   LABS:  CBC  Recent Labs Lab 06/13/15 2205 06/14/15 0249 06/15/15 0520  WBC 14.8* 11.9* 12.2*  HGB 11.5* 10.5* 10.1*  HCT 32.7* 29.7* 29.6*  PLT 304 260 260   Coag's No results for input(s): APTT, INR in the last 168 hours. BMET  Recent Labs Lab 06/14/15 1812 06/14/15 2042 06/15/15 0056 06/15/15 0520  NA 115* 118* 118* 121*  K 4.3 4.3  --  3.7  CL 86* 87*  --  90*  CO2 18* 16*  --  20*  BUN 19 20  --  20  CREATININE 1.10* 1.06*  --  0.96  GLUCOSE 201* 195*  --  112*   Electrolytes  Recent Labs Lab 06/14/15 1812 06/14/15 2042 06/15/15 0520  CALCIUM 7.9* 8.0* 8.1*   Sepsis Markers No results for input(s): LATICACIDVEN, PROCALCITON, O2SATVEN  in the last 168 hours. ABG No results for input(s): PHART, PCO2ART, PO2ART in the last 168 hours. Liver Enzymes  Recent Labs Lab 06/14/15 2042  AST 51*  ALT 15  ALKPHOS 68  BILITOT 1.8*  ALBUMIN 2.9*   Cardiac Enzymes  Recent Labs Lab 06/14/15 0249 06/14/15 0800 06/14/15 1358  TROPONINI <0.03 <0.03 <0.03   Glucose  Recent Labs Lab 06/14/15 2238 06/15/15 0748  GLUCAP 162* 120*     Recent Results (from the past 240 hour(s))  MRSA PCR Screening     Status: None   Collection Time: 06/14/15  2:01 AM  Result Value Ref Range Status   MRSA by PCR NEGATIVE NEGATIVE Final    Comment:  The GeneXpert MRSA Assay (FDA approved for NASAL specimens only), is one component of a comprehensive MRSA colonization surveillance program. It is not intended to diagnose MRSA infection nor to guide or monitor treatment for MRSA infections.      Current facility-administered medications:  .  acetaminophen (TYLENOL) tablet 650 mg, 650 mg, Oral, Q4H PRN, Ihor Austin, MD, 650 mg at 06/15/15 0510 .  albuterol (PROVENTIL) (2.5 MG/3ML) 0.083% nebulizer solution 3 mL, 3 mL, Inhalation, Q6H PRN, Ihor Austin, MD .  aspirin EC tablet 81 mg, 81 mg, Oral, Daily, Pavan Pyreddy, MD, 81 mg at 06/14/15 1001 .  atorvastatin (LIPITOR) tablet 20 mg, 20 mg, Oral, q1800, Pavan Pyreddy, MD .  budesonide (PULMICORT) nebulizer solution 0.25 mg, 2 mL, Inhalation, BID, Lamere Lightner, MD, 0.25 mg at 06/15/15 0844 .  clopidogrel (PLAVIX) tablet 75 mg, 75 mg, Oral, Q breakfast, Ihor Austin, MD, 75 mg at 06/15/15 0824 .  enoxaparin (LOVENOX) injection 40 mg, 40 mg, Subcutaneous, Q24H, Gaspare Netzel, MD .  insulin aspart (novoLOG) injection 0-9 Units, 0-9 Units, Subcutaneous, TID AC & HS, Oralia Manis, MD .  ipratropium-albuterol (DUONEB) 0.5-2.5 (3) MG/3ML nebulizer solution 3 mL, 3 mL, Nebulization, Q6H, Adalei Novell, MD, 3 mL at 06/15/15 0844 .  ipratropium-albuterol (DUONEB) 0.5-2.5 (3) MG/3ML  nebulizer solution 3 mL, 3 mL, Nebulization, Q4H PRN, Bassam Dresch, MD .  levofloxacin (LEVAQUIN) tablet 500 mg, 500 mg, Oral, QHS, Pavan Pyreddy, MD, 500 mg at 06/14/15 2156 .  lidocaine (LIDODERM) 5 % 1 patch, 1 patch, Transdermal, Q24H, Katharina Caper, MD, 1 patch at 06/14/15 1600 .  magic mouthwash, 10 mL, Oral, TID, Ileigh Mettler, MD, 10 mL at 06/14/15 2155 .  nicotine (NICODERM CQ - dosed in mg/24 hours) patch 21 mg, 21 mg, Transdermal, Daily, Katharina Caper, MD, 21 mg at 06/14/15 1518 .  nicotine polacrilex (NICORETTE) gum 2 mg, 2 mg, Oral, PRN, Katharina Caper, MD .  nitroGLYCERIN (NITROGLYN) 2 % ointment 0.5 inch, 0.5 inch, Topical, 4 times per day, Ihor Austin, MD, 0.5 inch at 06/15/15 0824 .  nitroGLYCERIN (NITROSTAT) SL tablet 0.4 mg, 0.4 mg, Sublingual, Q5 Min x 3 PRN, Pavan Pyreddy, MD .  ondansetron (ZOFRAN) injection 4 mg, 4 mg, Intravenous, Q6H PRN, Pavan Pyreddy, MD .  predniSONE (DELTASONE) tablet 40 mg, 40 mg, Oral, Q breakfast, Lama Narayanan, MD, 40 mg at 06/15/15 0824 .  sodium chloride (hypertonic) 3 % solution, , Intravenous, Continuous, Munsoor Lateef, MD, Last Rate: 30 mL/hr at 06/15/15 0600  IMAGING    Ct Angio Chest Pe W/cm &/or Wo Cm  06/14/2015  CLINICAL DATA:  Short of breath and chest tightness. Chest pain for several weeks with productive cough. Concern pulmonary embolism. Smoking history. EXAM: CT ANGIOGRAPHY CHEST WITH CONTRAST TECHNIQUE: Multidetector CT imaging of the chest was performed using the standard protocol during bolus administration of intravenous contrast. Multiplanar CT image reconstructions and MIPs were obtained to evaluate the vascular anatomy. CONTRAST:  75mL OMNIPAQUE IOHEXOL 350 MG/ML SOLN COMPARISON:  CT 03/23/2013 FINDINGS: Mediastinum/Nodes: No filling defects within the pulmonary suggest acute pulmonary embolism. No acute findings aorta great vessels. Mildly enlarged paratracheal lymph nodes up to 12 mm. Subcarinal lymph node similar 13 mm.  There is mild bilateral hilar lymphadenopathy. No pericardial fluid.  Esophagus is normal. Lungs/Pleura: Small nodule in the RIGHT upper lobe measures 5 mm on image 62, series 6. nodule appears slightly increased from 3 mm on prior. 3 mm RIGHT upper lobe nodule on image 67, series 6 is increased  from 2 mm on prior. There is peripheral ground-glass opacity in the LEFT upper lobe (image 43 through 59). Upper abdomen: Limited view of the liver, kidneys, pancreas are unremarkable. Normal adrenal glands. Musculoskeletal: No acute osseous abnormality. Review of the MIP images confirms the above findings. IMPRESSION: 1. No evidence acute pulmonary embolism. 2. Ground-glass opacities in the periphery of the LEFT upper lobe suggest mild pulmonary infection. 3. Mild mediastinal and hilar lymphadenopathy is likely reactive. 4. Interval enlargement of 2 small RIGHT upper lobe pulmonary nodules. 5. With the findings of infection, adenopathy, and potentially enlarging nodules coupled with smoking history, recommend follow-up CT without contrast in 3 months to re-evaluate. Electronically Signed   By: Genevive Bi M.D.   On: 06/14/2015 11:39   US Renal  06/14/2015  CLINICAL DATA:  Renal insufficiency EXAM: RENAL / URINARY TRACT ULTRASOUND COMPLETE COMPARISON:  None. FINDINGS: Right Kidney: Length: 11.8 cm. Echogenicity within normal limits. No mass or hydronephrosis visualized. Left Kidney: Length: 12.2 cm. Echogenicity within normal limits. No mass or hydronephrosis visualized. Bladder: Appears normal for degree of bladder distention. IMPRESSION: No hydronephrosis or diagnostic renal calculus. Suboptimal exam due to patient's large body habitus. Electronically Signed   By: Natasha Mead M.D.   On: 06/14/2015 14:26   US Venous Img Lower Bilateral  06/14/2015  CLINICAL DATA:  Bilateral leg swelling EXAM: BILATERAL LOWER EXTREMITY VENOUS DOPPLER ULTRASOUND TECHNIQUE: Gray-scale sonography with graded compression, as well as  color Doppler and duplex ultrasound were performed to evaluate the lower extremity deep venous systems from the level of the common femoral vein and including the common femoral, femoral, profunda femoral, popliteal and calf veins including the posterior tibial, peroneal and gastrocnemius veins when visible. The superficial great saphenous vein was also interrogated. Spectral Doppler was utilized to evaluate flow at rest and with distal augmentation maneuvers in the common femoral, femoral and popliteal veins. COMPARISON:  None. FINDINGS: RIGHT LOWER EXTREMITY Common Femoral Vein: No evidence of thrombus. Normal compressibility, respiratory phasicity and response to augmentation. Saphenofemoral Junction: No evidence of thrombus. Normal compressibility and flow on color Doppler imaging. Profunda Femoral Vein: No evidence of thrombus. Normal compressibility and flow on color Doppler imaging. Femoral Vein: No evidence of thrombus. Normal compressibility, respiratory phasicity and response to augmentation. Popliteal Vein: No evidence of thrombus. Normal compressibility, respiratory phasicity and response to augmentation. Calf Veins: No evidence of thrombus. Normal compressibility and flow on color Doppler imaging. Superficial Great Saphenous Vein: No evidence of thrombus. Normal compressibility and flow on color Doppler imaging. Venous Reflux:  None. Other Findings:  None. LEFT LOWER EXTREMITY Common Femoral Vein: No evidence of thrombus. Normal compressibility, respiratory phasicity and response to augmentation. Saphenofemoral Junction: No evidence of thrombus. Normal compressibility and flow on color Doppler imaging. Profunda Femoral Vein: No evidence of thrombus. Normal compressibility and flow on color Doppler imaging. Femoral Vein: No evidence of thrombus. Normal compressibility, respiratory phasicity and response to augmentation. Popliteal Vein: No evidence of thrombus. Normal compressibility, respiratory  phasicity and response to augmentation. Calf Veins: No evidence of thrombus. Normal compressibility and flow on color Doppler imaging. Superficial Great Saphenous Vein: No evidence of thrombus. Normal compressibility and flow on color Doppler imaging. Venous Reflux:  None. Other Findings: There is a left inguinal lymph node measures 2.8 x 1.9 cm. IMPRESSION: No evidence of deep venous thrombosis bilateral lower extremity. Left inguinal lymph node measures 2.8 x 1.9 cm. Electronically Signed   By: Natasha Mead M.D.   On: 06/14/2015 14:31  Indwelling Urinary Catheter continued, requirement due to   Reason to continue Indwelling Urinary Catheter for strict Intake/Output monitoring for hemodynamic instability   Central Line continued, requirement due to   Reason to continue [employer] Monitoring of central venous pressure or other hemodynamic parameters   Ventilator continued, requirement due to, resp failure    Ventilator Sedation RASS 0 to -2   Cultures: BCx2 11/26>> UC  Sputum 11/26>>  Antibiotics: levaquin 11/26>>   Lines:   ASSESSMENT/PLAN  49 year old female past medical history of obstructive sleep apnea, COPD, tobacco abuse, admitted for COPD exacerbation and acute hyponatremia  PULMONARY AECOPD SOB OSA ? CAP Tobacco Abuse Pulmonary Nodule - Right side, small P: - cont schedule nebulizers, Pulmicort, steroids.  Start 2 week steroid taper on 11/29 -Continue Levaquin for possible bronchitis -CT chest PE protocol given shortness of breath, leg swelling and history of tobacco abuse with PE and hyponatremia now -Incentive spirometry -BiPAP at night. Previously had a CPAP machine but lost it about 4 years ago due to insurance issues -2-D echo - EF 60-65%, elevated PAP ~56, cont with manage COPD/OSA -Patient counseled extensively on tobacco cessation for greater than 12 minutes - small right sided pulmonary nodule - most likely reactive, repeat CT as outpatient in 2-3  months.   CARDIOVASCULAR Chest pain Lower extremity edema Dyspnea P: -Trend cardiac enzymes -2-D echo -Cardiology consult  RENAL A:   Hyponatremia - improving Mild creatinine elevation - improving P:   -1 year ago sodium was in the 130s,normal neuro status, now back on 3% saline, managed by nephro -Monitor electrolytes closely -Patient has been moderately ill over the past 2-3 weeks, underlying pneumonia or malignancy can cause profound hyponatremia. Currently patient mentating well with no neurological disturbances with her current sodium level. - q4hr Na check, q12hr BMP -Nephrology following - appreciate assistance  GASTROINTESTINAL A:  History of gastric ulcer P:   -Continue with PPI  HEMATOLOGIC -Follow CBC  INFECTIOUS Bronchitis/?CAP Thrush -Continue Levaquin -Follow-up sputum culture -Follow-up blood culture -magic mouth wash -check HIV   ENDOCRINE -SSI if needed  NEUROLOGIC RASS goal: 0  Stable from a pulmonary standpoint to transfer to medical floor.    Pulmonary consult Time devoted to patient care services described in this note is 40 minutes.   Stephanie Acre, MD [facility] Pager [phone] (please enter 7-digits) On Call Pager - [phone] (please enter 7-digits)   06/15/2015, 8:55 AM  Note: This note was prepared with Dragon dictation along with smaller phrase technology. Any transcriptional errors that result from this process are unintentional.

## 2015-06-15 NOTE — Progress Notes (Signed)
MEDICATION RELATED CONSULT NOTE - Follow Up   Pharmacy Consult for hypertonic saline monitoring Indication: hyponatremia   Patient Measurements: Height: 5\' 8"  (172.7 cm) Weight: 203 lb 14.8 oz (92.5 kg) IBW/kg (Calculated) : 63.9  Vital Signs: Temp: 97.7 F (36.5 C) (11/27 0200) Temp Source: Oral (11/27 0200) BP: 100/43 mmHg (11/27 0600) Pulse Rate: 93 (11/27 0600)  Labs:  Recent Labs  06/13/15 2205 06/14/15 0053  06/14/15 0249  06/14/15 1812 06/14/15 2042 06/15/15 0520  WBC 14.8*  --   --  11.9*  --   --   --  12.2*  HGB 11.5*  --   --  10.5*  --   --   --  10.1*  HCT 32.7*  --   --  29.7*  --   --   --  29.6*  PLT 304  --   --  260  --   --   --  260  CREATININE 0.89  --   < > 0.98  < > 1.10* 1.06* 0.96  LABCREA  --  138  --   --   --   --   --   --   ALBUMIN  --   --   --   --   --   --  2.9*  --   PROT  --   --   --   --   --   --  6.6  --   AST  --   --   --   --   --   --  51*  --   ALT  --   --   --   --   --   --  15  --   ALKPHOS  --   --   --   --   --   --  68  --   BILITOT  --   --   --   --   --   --  1.8*  --   < > = values in this interval not displayed. Estimated Creatinine Clearance: 83.3 mL/min (by C-G formula based on Cr of 0.96).   Medical History: Past Medical History  Diagnosis Date  . Arthritis   . COPD (chronic obstructive pulmonary disease) (HCC)   . Gastric ulcer   . OSA on CPAP no ins - no cpap machine for the last 4 years  . Asthma   . Hypertension     Medications:  Infusions:  . sodium chloride (hypertonic) 30 mL/hr at 06/15/15 0600    Assessment: 49 yof cc SOB being worked up for SOB/chest pain presents with serum sodium 112 mEq/L. Starting hypertonic saline.  1/26 0800 sodium 115 11/26 1358 sodium 115  Goal of Therapy:  1. Increase serum sodium 4 to 6 mEq/L as soon as possible 2. Do not exceed increase of 8 mEq/L per day  Plan:  1126 2042 sodium level 118 mEq/L. Total increase of 6 mEq/L in past 24 hours - safe rate  of correction. Currently running sodium chloride 3% 30 mL/hr. Continue current rate and will check sodium again in 4 hours, 06/15/15 at 0100. Pharmacy will continue to follow.  1127 0520 sodium level 121 mEq/L. Total increase of 7 mEq/L in past 24 hours and 3 mEq in past 4 hours. Currently running sodium choride 3% 30 mL/hr. Continue current rate and will check sodium again in 4 hours, 06/15/15 at 0900. If sodium rises above 123 mEq/L on next check would  decrease rate to 20 mL/hr. Continue Q4H checks. Patient needs central line as soon as possible to continue receiving hypertonic saline.   1127 0909 sodiu level 120 mEq/L. Continue current rate and will check sodium again in 4 hours, 06/15/15 at 1300. If sodium rises above 123 mEq/L on next check would decrease rate to 20 mL/hr. Continue Q4H checks. Patient needs central line as soon as possible to continue receiving hypertonic saline.   Stormy Card, [facility] Clinical Pharmacist  06/15/2015 10:14 AM

## 2015-06-15 NOTE — Progress Notes (Signed)
[facility]  ROUNDING NOTE   Subjective:  Patient seen at bedside. Serum sodium up to 121. Now back on 3% saline under our instruction. PICC placement pending.    Objective:  Vital signs in last 24 hours:  Temp:  [97.5 F (36.4 C)-98.5 F (36.9 C)] 97.7 F (36.5 C) (11/27 0200) Pulse Rate:  [79-105] 93 (11/27 0600) Resp:  [21-36] 29 (11/27 0600) BP: (91-113)/(43-79) 100/43 mmHg (11/27 0600) SpO2:  [91 %-100 %] 96 % (11/27 0600) Weight:  [92.5 kg (203 lb 14.8 oz)] 92.5 kg (203 lb 14.8 oz) (11/27 0618)  Weight change: 1.781 kg (3 lb 14.8 oz) Filed Weights   06/13/15 2142 06/14/15 0158 06/15/15 0618  Weight: 90.719 kg (200 lb) 92.8 kg (204 lb 9.4 oz) 92.5 kg (203 lb 14.8 oz)    Intake/Output: I/O last 3 completed shifts: In: 750 [P.O.:420; I.V.:330] Out: 450 [Urine:450]   Intake/Output this shift:     Physical Exam: General: NAD, resting in bed   Head: Normocephalic, atraumatic. Moist oral mucosal membranes  Eyes: Anicteric  Neck: Supple, trachea midline  Lungs:  Clear to auscultation  Heart: Regular rate and rhythm  Abdomen:  Soft, nontender, bowel sounds present   Extremities:  trace peripheral edema.  Neurologic: Nonfocal, moving all four extremities  Skin: No lesions       Basic Metabolic Panel:  Recent Labs Lab 06/14/15 0546 06/14/15 0800 06/14/15 1358 06/14/15 1812 06/14/15 2042 06/15/15 0056 06/15/15 0520  NA 114* 115* 115* 115* 118* 118* 121*  K 4.5 4.1  --  4.3 4.3  --  3.7  CL 82* 83*  --  86* 87*  --  90*  CO2 21* 22  --  18* 16*  --  20*  GLUCOSE 134* 117*  --  201* 195*  --  112*  BUN 20 21*  --  19 20  --  20  CREATININE 1.05* 1.07*  --  1.10* 1.06*  --  0.96  CALCIUM 8.0* 7.9*  --  7.9* 8.0*  --  8.1*    Liver Function Tests:  Recent Labs Lab 06/14/15 2042  AST 51*  ALT 15  ALKPHOS 68  BILITOT 1.8*  PROT 6.6  ALBUMIN 2.9*   No results for input(s): LIPASE, AMYLASE in the last 168 hours. No results for  input(s): AMMONIA in the last 168 hours.  CBC:  Recent Labs Lab 06/13/15 2205 06/14/15 0249 06/15/15 0520  WBC 14.8* 11.9* 12.2*  HGB 11.5* 10.5* 10.1*  HCT 32.7* 29.7* 29.6*  MCV 111.6* 110.4* 111.6*  PLT 304 260 260    Cardiac Enzymes:  Recent Labs Lab 06/13/15 2205 06/14/15 0249 06/14/15 0800 06/14/15 1358  TROPONINI 0.06* <0.03 <0.03 <0.03    BNP: Invalid input(s): POCBNP  CBG:  Recent Labs Lab 06/14/15 2238 06/15/15 0748  GLUCAP 162* 120*    Microbiology: Results for orders placed or performed during the hospital encounter of 06/13/15  MRSA PCR Screening     Status: None   Collection Time: 06/14/15  2:01 AM  Result Value Ref Range Status   MRSA by PCR NEGATIVE NEGATIVE Final    Comment:        The GeneXpert MRSA Assay (FDA approved for NASAL specimens only), is one component of a comprehensive MRSA colonization surveillance program. It is not intended to diagnose MRSA infection nor to guide or monitor treatment for MRSA infections.     Coagulation Studies: No results for input(s): LABPROT, INR in the last 72 hours.  Urinalysis:  Recent Labs  06/14/15 0053  COLORURINE AMBER*  LABSPEC 1.012  PHURINE 5.0  GLUCOSEU NEGATIVE  HGBUR 1+*  BILIRUBINUR NEGATIVE  KETONESUR NEGATIVE  PROTEINUR NEGATIVE  NITRITE NEGATIVE  LEUKOCYTESUR 3+*      Imaging: Dg Chest 2 View  06/13/2015  CLINICAL DATA:  Shortness of breath. Asthma. COPD. Swelling of the feet and legs. EXAM: CHEST  2 VIEW COMPARISON:  03/23/2013 FINDINGS: Indistinct pulmonary vasculature, with slight interstitial accentuation in the lung bases. Is is heart size within normal limits. No pleural effusion. Mild bilateral airway thickening. IMPRESSION: 1. Airway thickening is present, suggesting bronchitis or reactive airways disease. There is faint associated interstitial accentuation in the lung bases. No overt airspace opacity. Electronically Signed   By: Gaylyn Rong M.D.   On:  06/13/2015 22:34   Ct Angio Chest Pe W/cm &/or Wo Cm  06/14/2015  CLINICAL DATA:  Short of breath and chest tightness. Chest pain for several weeks with productive cough. Concern pulmonary embolism. Smoking history. EXAM: CT ANGIOGRAPHY CHEST WITH CONTRAST TECHNIQUE: Multidetector CT imaging of the chest was performed using the standard protocol during bolus administration of intravenous contrast. Multiplanar CT image reconstructions and MIPs were obtained to evaluate the vascular anatomy. CONTRAST:  75mL OMNIPAQUE IOHEXOL 350 MG/ML SOLN COMPARISON:  CT 03/23/2013 FINDINGS: Mediastinum/Nodes: No filling defects within the pulmonary suggest acute pulmonary embolism. No acute findings aorta great vessels. Mildly enlarged paratracheal lymph nodes up to 12 mm. Subcarinal lymph node similar 13 mm. There is mild bilateral hilar lymphadenopathy. No pericardial fluid.  Esophagus is normal. Lungs/Pleura: Small nodule in the RIGHT upper lobe measures 5 mm on image 62, series 6. nodule appears slightly increased from 3 mm on prior. 3 mm RIGHT upper lobe nodule on image 67, series 6 is increased from 2 mm on prior. There is peripheral ground-glass opacity in the LEFT upper lobe (image 43 through 59). Upper abdomen: Limited view of the liver, kidneys, pancreas are unremarkable. Normal adrenal glands. Musculoskeletal: No acute osseous abnormality. Review of the MIP images confirms the above findings. IMPRESSION: 1. No evidence acute pulmonary embolism. 2. Ground-glass opacities in the periphery of the LEFT upper lobe suggest mild pulmonary infection. 3. Mild mediastinal and hilar lymphadenopathy is likely reactive. 4. Interval enlargement of 2 small RIGHT upper lobe pulmonary nodules. 5. With the findings of infection, adenopathy, and potentially enlarging nodules coupled with smoking history, recommend follow-up CT without contrast in 3 months to re-evaluate. Electronically Signed   By: Genevive Bi M.D.   On: 06/14/2015  11:39   US Renal  06/14/2015  CLINICAL DATA:  Renal insufficiency EXAM: RENAL / URINARY TRACT ULTRASOUND COMPLETE COMPARISON:  None. FINDINGS: Right Kidney: Length: 11.8 cm. Echogenicity within normal limits. No mass or hydronephrosis visualized. Left Kidney: Length: 12.2 cm. Echogenicity within normal limits. No mass or hydronephrosis visualized. Bladder: Appears normal for degree of bladder distention. IMPRESSION: No hydronephrosis or diagnostic renal calculus. Suboptimal exam due to patient's large body habitus. Electronically Signed   By: Natasha Mead M.D.   On: 06/14/2015 14:26   US Venous Img Lower Bilateral  06/14/2015  CLINICAL DATA:  Bilateral leg swelling EXAM: BILATERAL LOWER EXTREMITY VENOUS DOPPLER ULTRASOUND TECHNIQUE: Gray-scale sonography with graded compression, as well as color Doppler and duplex ultrasound were performed to evaluate the lower extremity deep venous systems from the level of the common femoral vein and including the common femoral, femoral, profunda femoral, popliteal and calf veins including the posterior tibial, peroneal and gastrocnemius veins  when visible. The superficial great saphenous vein was also interrogated. Spectral Doppler was utilized to evaluate flow at rest and with distal augmentation maneuvers in the common femoral, femoral and popliteal veins. COMPARISON:  None. FINDINGS: RIGHT LOWER EXTREMITY Common Femoral Vein: No evidence of thrombus. Normal compressibility, respiratory phasicity and response to augmentation. Saphenofemoral Junction: No evidence of thrombus. Normal compressibility and flow on color Doppler imaging. Profunda Femoral Vein: No evidence of thrombus. Normal compressibility and flow on color Doppler imaging. Femoral Vein: No evidence of thrombus. Normal compressibility, respiratory phasicity and response to augmentation. Popliteal Vein: No evidence of thrombus. Normal compressibility, respiratory phasicity and response to augmentation. Calf  Veins: No evidence of thrombus. Normal compressibility and flow on color Doppler imaging. Superficial Great Saphenous Vein: No evidence of thrombus. Normal compressibility and flow on color Doppler imaging. Venous Reflux:  None. Other Findings:  None. LEFT LOWER EXTREMITY Common Femoral Vein: No evidence of thrombus. Normal compressibility, respiratory phasicity and response to augmentation. Saphenofemoral Junction: No evidence of thrombus. Normal compressibility and flow on color Doppler imaging. Profunda Femoral Vein: No evidence of thrombus. Normal compressibility and flow on color Doppler imaging. Femoral Vein: No evidence of thrombus. Normal compressibility, respiratory phasicity and response to augmentation. Popliteal Vein: No evidence of thrombus. Normal compressibility, respiratory phasicity and response to augmentation. Calf Veins: No evidence of thrombus. Normal compressibility and flow on color Doppler imaging. Superficial Great Saphenous Vein: No evidence of thrombus. Normal compressibility and flow on color Doppler imaging. Venous Reflux:  None. Other Findings: There is a left inguinal lymph node measures 2.8 x 1.9 cm. IMPRESSION: No evidence of deep venous thrombosis bilateral lower extremity. Left inguinal lymph node measures 2.8 x 1.9 cm. Electronically Signed   By: Natasha Mead M.D.   On: 06/14/2015 14:31     Medications:   . sodium chloride (hypertonic) 30 mL/hr at 06/15/15 0600   . aspirin EC  81 mg Oral Daily  . atorvastatin  20 mg Oral q1800  . budesonide  2 mL Inhalation BID  . clopidogrel  75 mg Oral Q breakfast  . enoxaparin (LOVENOX) injection  40 mg Subcutaneous Q24H  . insulin aspart  0-9 Units Subcutaneous TID AC & HS  . ipratropium-albuterol  3 mL Nebulization Q6H  . levofloxacin  500 mg Oral QHS  . lidocaine  1 patch Transdermal Q24H  . magic mouthwash  10 mL Oral TID  . nicotine  21 mg Transdermal Daily  . nitroGLYCERIN  0.5 inch Topical 4 times per day  . predniSONE   40 mg Oral Q breakfast   acetaminophen, albuterol, ipratropium-albuterol, nicotine polacrilex, nitroGLYCERIN, ondansetron (ZOFRAN) IV  Assessment/ Plan:  49 y.o. female with a PMHx of COPD, gastric ulcer, osteoarthritis, tobacco abuse, who was admitted to [facility] on 06/13/2015 for evaluation of shortness of breath and cough.  1. Hyponatremia. 2. Oral thrush. 3. Urinary tract infection.  Plan:  Serum sodium is improved to 121. This is appropriate given the fact that serum sodium was 114 yesterday. We will obtain a PICC placement for blood draws and continued hypertonic saline use. Continue to monitor serum sodium every 4 hours for now. Patient appears to be asymptomatic at this point in time. Continue Magic mouthwash for oral thrush. HIV screen was found to be negative. Continue Levaquin for the underlying UTI as well. Okay to transfer to floor care from our perspective.   LOS: 1 Mandell Pangborn 11/27/20168:12 AM

## 2015-06-15 NOTE — Progress Notes (Signed)
Called Dr. Cherylann RatelLateef to notify that NA this AM is 121. Continue current drip.

## 2015-06-15 NOTE — Progress Notes (Signed)
MEDICATION RELATED CONSULT NOTE - Follow Up   Pharmacy Consult for hypertonic saline monitoring Indication: hyponatremia   Patient Measurements: Height:  (172.7 cm) Weight: 204 lb 9.4 oz (92.8 kg) IBW/kg (Calculated) : 63.9  Vital Signs: Temp: 97.7 F (36.5 C) (11/27 0200) Temp Source: Oral (11/27 0200) BP: 112/51 mmHg (11/27 0500) Pulse Rate: 95 (11/27 0500)  Labs:  Recent Labs  06/13/15 2205 06/14/15 0053  06/14/15 0249  06/14/15 1812 06/14/15 2042 06/15/15 0520  WBC 14.8*  --   --  11.9*  --   --   --  12.2*  HGB 11.5*  --   --  10.5*  --   --   --  10.1*  HCT 32.7*  --   --  29.7*  --   --   --  29.6*  PLT 304  --   --  260  --   --   --  260  CREATININE 0.89  --   < > 0.98  < > 1.10* 1.06* 0.96  LABCREA  --  138  --   --   --   --   --   --   ALBUMIN  --   --   --   --   --   --  2.9*  --   PROT  --   --   --   --   --   --  6.6  --   AST  --   --   --   --   --   --  51*  --   ALT  --   --   --   --   --   --  15  --   ALKPHOS  --   --   --   --   --   --  68  --   BILITOT  --   --   --   --   --   --  1.8*  --   < > = values in this interval not displayed. Estimated Creatinine Clearance: 83.6 mL/min (by C-G formula based on Cr of 0.96).   Medical History: Past Medical History  Diagnosis Date  . Arthritis   . COPD (chronic obstructive pulmonary disease) (HCC)   . Gastric ulcer   . OSA on CPAP no ins - no cpap machine for the last 4 years  . Asthma   . Hypertension     Medications:  Infusions:  . sodium chloride (hypertonic) 30 mL/hr at 06/15/15 0500    Assessment: 49 yof cc SOB being worked up for SOB/chest pain presents with serum sodium 112 mEq/L. Starting hypertonic saline.  1/26 0800 sodium 115 11/26 1358 sodium 115  Goal of Therapy:  1. Increase serum sodium 4 to 6 mEq/L as soon as possible 2. Do not exceed increase of 8 mEq/L per day  Plan:  1126 2042 sodium level 118 mEq/L. Total increase of 6 mEq/L in past 24 hours - safe rate  of correction. Currently running sodium chloride 3% 30 mL/hr. Continue current rate and will check sodium again in 4 hours, 06/15/15 at 0100. Pharmacy will continue to follow.  1127 0520 sodium level 121 mEq/L. Total increase of 7 mEq/L in past 24 hours and 3 mEq in past 4 hours. Currently running sodium choride 3% 30 mL/hr. Continue current rate and will check sodium again in 4 hours, 06/15/15 at 0900. If sodium rises above 123 mEq/L on next check would  decrease rate to 20 mL/hr. Continue Q4H checks. Patient needs central line as soon as possible to continue receiving hypertonic saline.   Maurisa Tesmer A. [place], [place] BCPS Clinical Pharmacist  06/15/2015 6:11 AM

## 2015-06-15 NOTE — Progress Notes (Signed)
Uncontrolled pain for chronic back pain. Pt states she goes to the pain clinic, will need follow-up. Only has Tylenol ordered.

## 2015-06-15 NOTE — Progress Notes (Signed)
Patient: Erica [name] / Admit Date: 06/13/2015 / Date of Encounter: 06/15/2015, 11:45 AM   Subjective: Increasing cough today, turning red in the face, coughing spasms Nurses report increased cough this morning  patient reports increasing shortness of breath Compared to yesterday no productive sputum, white sputum sticking in the back of her throat   Review of Systems: Review of Systems  Constitutional: Positive for malaise/fatigue.  Respiratory: Positive for cough and shortness of breath.   Cardiovascular: Positive for leg swelling.  Gastrointestinal: Negative.   Musculoskeletal: Negative.   Neurological: Negative.   Psychiatric/Behavioral: Negative.   All other systems reviewed and are negative.    Objective: Telemetry: nsr Physical Exam: Blood pressure 100/43, pulse 93, temperature 97.7 F (36.5 C), temperature source Oral, resp. rate 29, height  (1.727 m), weight 203 lb 14.8 oz (92.5 kg), last menstrual period 03/13/2015, SpO2 96 %. Body mass index is 31.01 kg/(m^2). General: Well developed, well nourished, significant cough Head: Normocephalic, atraumatic, sclera non-icteric, no xanthomas, nares are without discharge. Neck: Negative for carotid bruits. JVD not elevated. Lungs: Decreased breath sounds throughout, Rales bilaterally Heart: RRR with S1 S2. No murmurs, rubs, or gallops appreciated. Abdomen: Soft, non-tender, non-distended with normoactive bowel sounds. No hepatomegaly. No rebound/guarding. No obvious abdominal masses. Msk: Strength and tone appear normal for age. Extremities: No clubbing or cyanosis. Minimal pitting edema around the ankles, Distal pedal pulses are 2+ and equal bilaterally. Neuro: Alert and oriented X 3. No facial asymmetry. No focal deficit. Moves all extremities spontaneously. Psych: Responds to questions appropriately with a normal affect.   Intake/Output Summary (Last 24 hours) at 06/15/15 1145 Last data filed at  06/15/15 0600  Gross per 24 hour  Intake    750 ml  Output    450 ml  Net    300 ml    Inpatient Medications:  . aspirin EC  81 mg Oral Daily  . atorvastatin  20 mg Oral q1800  . budesonide  2 mL Inhalation BID  . clopidogrel  75 mg Oral Q breakfast  . enoxaparin (LOVENOX) injection  40 mg Subcutaneous Q24H  . insulin aspart  0-9 Units Subcutaneous TID AC & HS  . ipratropium-albuterol  3 mL Nebulization Q6H  . levofloxacin  500 mg Oral QHS  . lidocaine  1 patch Transdermal Q24H  . magic mouthwash  10 mL Oral TID  . nicotine  21 mg Transdermal Daily  . nitroGLYCERIN  0.5 inch Topical 4 times per day  . predniSONE  40 mg Oral Q breakfast   Infusions:  . sodium chloride (hypertonic) 30 mL/hr at 06/15/15 0600    Labs:  Recent Labs  06/14/15 2042  06/15/15 0520 06/15/15 0909  NA 118*  < > 121* 120*  K 4.3  --  3.7  --   CL 87*  --  90*  --   CO2 16*  --  20*  --   GLUCOSE 195*  --  112*  --   BUN 20  --  20  --   CREATININE 1.06*  --  0.96  --   CALCIUM 8.0*  --  8.1*  --   < > = values in this interval not displayed.  Recent Labs  06/14/15 2042  AST 51*  ALT 15  ALKPHOS 68  BILITOT 1.8*  PROT 6.6  ALBUMIN 2.9*    Recent Labs  06/14/15 0249 06/15/15 0520  WBC 11.9* 12.2*  HGB 10.5* 10.1*  HCT 29.7* 29.6*  MCV 110.4* 111.6*  PLT 260 260    Recent Labs  06/13/15 2205 06/14/15 0249 06/14/15 0800 06/14/15 1358  TROPONINI 0.06* <0.03 <0.03 <0.03   Invalid input(s): POCBNP  Recent Labs  06/14/15 1358  HGBA1C 6.2*     Weights: Filed Weights   06/13/15 2142 06/14/15 0158 06/15/15 0618  Weight: 200 lb (90.719 kg) 204 lb 9.4 oz (92.8 kg) 203 lb 14.8 oz (92.5 kg)     Radiology/Studies:  Dg Chest 2 View  06/13/2015   IMPRESSION: 1. Airway thickening is present, suggesting bronchitis or reactive airways disease. There is faint associated interstitial accentuation in the lung bases. No overt airspace opacity. Electronically Signed   By: Gaylyn Rong M.D.   On: 06/13/2015 22:34   Ct Angio Chest Pe W/cm &/or Wo Cm  06/14/2015   IMPRESSION: 1. No evidence acute pulmonary embolism. 2. Ground-glass opacities in the periphery of the LEFT upper lobe suggest mild pulmonary infection. 3. Mild mediastinal and hilar lymphadenopathy is likely reactive. 4. Interval enlargement of 2 small RIGHT upper lobe pulmonary nodules. 5. With the findings of infection, adenopathy, and potentially enlarging nodules coupled with smoking history, recommend follow-up CT without contrast in 3 months to re-evaluate. Electronically Signed   By: Genevive Bi M.D.   On: 06/14/2015 11:39   US Renal  06/14/2015   IMPRESSION: No hydronephrosis or diagnostic renal calculus. Suboptimal exam due to patient's large body habitus. Electronically Signed   By: Natasha Mead M.D.   On: 06/14/2015 14:26   US Venous Img Lower Bilateral  06/14/2015   IMPRESSION: No evidence of deep venous thrombosis bilateral lower extremity. Left inguinal lymph node measures 2.8 x 1.9 cm. Electronically Signed   By: Natasha Mead M.D.   On: 06/14/2015 14:31     Assessment and Plan   1. Respiratory distress/shortness of breath Suspect upper respiratory infection, unable to exclude acute on chronic diastolic CHF, pulmonary hypertension Moderately elevated right heart pressures seen on echocardiogram, RVSP 56 She has been on IV fluids for low sodium Patient reports cough and shortness of breath is worse today. Nurses agree --If shortness of breath and cough get worse, will likely need Lasix --Currently on antibiotic, COPD regimen, followed by pulmonary -- Once her symptoms have improved, could consider Myoview to rule out ischemia High risk of underlying coronary artery disease given long history of smoking  2) COPD, exacerbation Managed by pulmonary, Antibiotic, nebulizer If no improvement of symptoms, may need steroids  3) thrush long history of thrush over the past several months,  previously treated with swish and swallow as well as nystatin got better but then came back May have started with steroid inhaler,QVAR Will defer management to medical team.   4) chronic back pain Patient requesting to get out of bed and sit in recliner Should be stable to move out of bed with assistance  5) acute on chronic diastolic CHF No PE on CT scan Echo showing Elevated right heart pressures likely secondary to underlying lung disease, diastolic dysfunction  6) hyponatremia Etiology unclear, she does admit to poor diet given her thrush Not on any medications that should have contributed to low sodium  Started on hypertonic saline with improvement of sodium If needed could use samsca  7) leg edema  patient reports having leg swelling for several months This may be consistent with elevated right heart pressures May need Lasix for symptom relief, shortness of breath, cough consistent with elevated right heart pressures  Signed, Tim  Mariah MillingGollan, MD [facility] 06/15/2015, 11:45 AM

## 2015-06-15 NOTE — Progress Notes (Addendum)
MEDICATION RELATED CONSULT NOTE - Follow Up   Pharmacy Consult for hypertonic saline monitoring Indication: hyponatremia   Patient Measurements: Height: 5\' 8"  (172.7 cm) Weight: 203 lb 14.8 oz (92.5 kg) IBW/kg (Calculated) : 63.9  Vital Signs: Temp: 98.1 F (36.7 C) (11/27 1541) Temp Source: Oral (11/27 1541) BP: 123/52 mmHg (11/27 1541) Pulse Rate: 96 (11/27 1541)  Labs:  Recent Labs  06/13/15 2205 06/14/15 0053  06/14/15 0249  06/14/15 2042 06/15/15 0520 06/15/15 1827  WBC 14.8*  --   --  11.9*  --   --  12.2*  --   HGB 11.5*  --   --  10.5*  --   --  10.1*  --   HCT 32.7*  --   --  29.7*  --   --  29.6*  --   PLT 304  --   --  260  --   --  260  --   CREATININE 0.89  --   < > 0.98  < > 1.06* 0.96 1.17*  LABCREA  --  138  --   --   --   --   --   --   ALBUMIN  --   --   --   --   --  2.9*  --   --   PROT  --   --   --   --   --  6.6  --   --   AST  --   --   --   --   --  51*  --   --   ALT  --   --   --   --   --  15  --   --   ALKPHOS  --   --   --   --   --  68  --   --   BILITOT  --   --   --   --   --  1.8*  --   --   < > = values in this interval not displayed. Estimated Creatinine Clearance: 68.4 mL/min (by C-G formula based on Cr of 1.17).   Medical History: Past Medical History  Diagnosis Date  . Arthritis   . COPD (chronic obstructive pulmonary disease) (HCC)   . Gastric ulcer   . OSA on CPAP no ins - no cpap machine for the last 4 years  . Asthma   . Hypertension     Medications:  Infusions:  . sodium chloride (hypertonic) 40 mL/hr (06/15/15 1329)    Assessment: 49 yof cc SOB being worked up for SOB/chest pain presents with serum sodium 112 mEq/L. Starting hypertonic saline.  1/26 0800 sodium 115 11/26 1358 sodium 115  Goal of Therapy:  1. Increase serum sodium 4 to 6 mEq/L as soon as possible 2. Do not exceed increase of 8 mEq/L per day  Plan:  1126 2042 sodium level 118 mEq/L. Total increase of 6 mEq/L in past 24 hours - safe rate  of correction. Currently running sodium chloride 3% 30 mL/hr. Continue current rate and will check sodium again in 4 hours, 06/15/15 at 0100. Pharmacy will continue to follow.  1127 0520 sodium level 121 mEq/L. Total increase of 7 mEq/L in past 24 hours and 3 mEq in past 4 hours. Currently running sodium choride 3% 30 mL/hr. Continue current rate and will check sodium again in 4 hours, 06/15/15 at 0900. If sodium rises above 123 mEq/L on next check would decrease  rate to 20 mL/hr. Continue Q4H checks. Patient needs central line as soon as possible to continue receiving hypertonic saline.   1127 0909 sodiu level 120 mEq/L. Continue current rate and will check sodium again in 4 hours, 06/15/15 at 1300. If sodium rises above 123 mEq/L on next check would decrease rate to 20 mL/hr. Continue Q4H checks. Patient needs central line as soon as possible to continue receiving hypertonic saline.   1127  Na @ 18:27 = 120 .    Continue this pt on current rate of 40 ml/hr.   Scherrie Gerlach [facility] Clinical Pharmacist  06/15/2015 7:08 PM

## 2015-06-15 NOTE — Progress Notes (Addendum)
[facility] [place] [facility]   PATIENT NAME: Erica [name]    MR#:  [id number]  DATE OF BIRTH:  08-22-1965  SUBJECTIVE:  CHIEF COMPLAINT:   Chief Complaint  Patient presents with  . Shortness of Breath   patient is 49 year old Caucasian female with history of COPD, ongoing tobacco abuse who presents to the hospital with complaints of shortness of breath, chest tightness. On arrival to emergency room, she was noted to have mild elevation of troponin and very low sodium level at 112. Denies any neurologic symptoms including seizures, tingling or numbness or confusion. Admits of lower extremity swelling and pain, also chronic lower back pain. Patient was initiated on 3% sodium solution with minimal improvement of her sodium levels. 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 Nephrologist saw patient in consultation and recommended normal saline infusion due to concerns of underlying dehydration. Patient was seen by pulmonologist/intensivist, who felt that she is to continue COPD therapy. Patient complains of lower extremity pain, chronic low back pain, leg edema. She is more short of breath today, legs are more swollen, also asks for Percocet for back pain. Sodium level has improved to 120.   Review of Systems  Constitutional: Negative for fever, chills and weight loss.  HENT: Negative for congestion.   Eyes: Negative for blurred vision and double vision.  Respiratory: Positive for cough and shortness of breath. Negative for sputum production and wheezing.   Cardiovascular: Negative for chest pain, palpitations, orthopnea, leg swelling and PND.  Gastrointestinal: Negative for nausea, vomiting, abdominal pain, diarrhea, constipation and blood in stool.  Genitourinary: Negative for dysuria, urgency, frequency and hematuria.  Musculoskeletal: Negative for falls.  Neurological: Negative for dizziness, tremors, focal weakness and  headaches.  Endo/Heme/Allergies: Does not bruise/bleed easily.  Psychiatric/Behavioral: Negative for depression. The patient does not have insomnia.     VITAL SIGNS: Blood pressure 106/56, pulse 94, temperature 97.6 F (36.4 C), temperature source Oral, resp. rate 18, height 5\' 8"  (1.727 m), weight 92.5 kg (203 lb 14.8 oz), last menstrual period 03/13/2015, SpO2 100 %.  PHYSICAL EXAMINATION:   GENERAL:  49 y.o.-year-old patient lying in the bed moderate respiratory distress. Tachypneic even slightest exertion or talking.  Looking older than stated age EYES: Pupils equal, round, reactive to light and accommodation. No scleral icterus. Extraocular muscles intact.  HEENT: Head atraumatic, normocephalic. Oropharynx and nasopharynx clear.  NECK:  Supple, no jugular venous distention. No thyroid enlargement, no tenderness.  LUNGS: Some better air entrance overall bilaterally , bilateral crackles, especially in the right base posteriorly , no wheezing, . Using accessory muscles of respiration with exertion.  CARDIOVASCULAR: S1, S2 normal. No murmurs, rubs, or gallops.  ABDOMEN: Soft, nontender, nondistended. Bowel sounds present. No organomegaly or mass.  EXTREMITIES: 1+ lower extremity and pedal edema, no cyanosis, or clubbing. Dorsalis pedis pulses are palpable at 1+ NEUROLOGIC: Cranial nerves II through XII are intact. Muscle strength 5/5 in all extremities. Sensation intact. Gait not checked.  PSYCHIATRIC: The patient is alert and oriented x 3.  SKIN: No obvious rash, lesion, or ulcer.   ORDERS/RESULTS REVIEWED:   CBC  Recent Labs Lab 06/13/15 2205 06/14/15 0249 06/15/15 0520  WBC 14.8* 11.9* 12.2*  HGB 11.5* 10.5* 10.1*  HCT 32.7* 29.7* 29.6*  PLT 304 260 260  MCV 111.6* 110.4* 111.6*  MCH 39.2* 39.1* 37.9*  MCHC 35.1 35.4 34.0  RDW 19.2* 19.5* 19.6*    ------------------------------------------------------------------------------------------------------------------  Chemistries   Recent Labs  Lab 06/14/15 0546 06/14/15 0800  06/14/15 1812 06/14/15 2042 06/15/15 0056 06/15/15 0520 06/15/15 0909  NA 114* 115*  < > 115* 118* 118* 121* 120*  K 4.5 4.1  --  4.3 4.3  --  3.7  --   CL 82* 83*  --  86* 87*  --  90*  --   CO2 21* 22  --  18* 16*  --  20*  --   GLUCOSE 134* 117*  --  201* 195*  --  112*  --   BUN 20 21*  --  19 20  --  20  --   CREATININE 1.05* 1.07*  --  1.10* 1.06*  --  0.96  --   CALCIUM 8.0* 7.9*  --  7.9* 8.0*  --  8.1*  --   AST  --   --   --   --  51*  --   --   --   ALT  --   --   --   --  15  --   --   --   ALKPHOS  --   --   --   --  68  --   --   --   BILITOT  --   --   --   --  1.8*  --   --   --   < > = values in this interval not displayed. ------------------------------------------------------------------------------------------------------------------ estimated creatinine clearance is 83.3 mL/min (by C-G formula based on Cr of 0.96). ------------------------------------------------------------------------------------------------------------------  Recent Labs  06/13/15 2205  TSH 4.003    Cardiac Enzymes  Recent Labs Lab 06/14/15 0249 06/14/15 0800 06/14/15 1358  TROPONINI <0.03 <0.03 <0.03   ------------------------------------------------------------------------------------------------------------------ Invalid input(s): POCBNP ---------------------------------------------------------------------------------------------------------------  RADIOLOGY: Dg Chest 2 View  06/13/2015  CLINICAL DATA:  Shortness of breath. Asthma. COPD. Swelling of the feet and legs. EXAM: CHEST  2 VIEW COMPARISON:  03/23/2013 FINDINGS: Indistinct pulmonary vasculature, with slight interstitial accentuation in the lung bases. Is is heart size within normal limits. No pleural effusion. Mild bilateral airway  thickening. IMPRESSION: 1. Airway thickening is present, suggesting bronchitis or reactive airways disease. There is faint associated interstitial accentuation in the lung bases. No overt airspace opacity. Electronically Signed   By: Gaylyn Rong M.D.   On: 06/13/2015 22:34   Ct Angio Chest Pe W/cm &/or Wo Cm  06/14/2015  CLINICAL DATA:  Short of breath and chest tightness. Chest pain for several weeks with productive cough. Concern pulmonary embolism. Smoking history. EXAM: CT ANGIOGRAPHY CHEST WITH CONTRAST TECHNIQUE: Multidetector CT imaging of the chest was performed using the standard protocol during bolus administration of intravenous contrast. Multiplanar CT image reconstructions and MIPs were obtained to evaluate the vascular anatomy. CONTRAST:  75mL OMNIPAQUE IOHEXOL 350 MG/ML SOLN COMPARISON:  CT 03/23/2013 FINDINGS: Mediastinum/Nodes: No filling defects within the pulmonary suggest acute pulmonary embolism. No acute findings aorta great vessels. Mildly enlarged paratracheal lymph nodes up to 12 mm. Subcarinal lymph node similar 13 mm. There is mild bilateral hilar lymphadenopathy. No pericardial fluid.  Esophagus is normal. Lungs/Pleura: Small nodule in the RIGHT upper lobe measures 5 mm on image 62, series 6. nodule appears slightly increased from 3 mm on prior. 3 mm RIGHT upper lobe nodule on image 67, series 6 is increased from 2 mm on prior. There is peripheral ground-glass opacity in the LEFT upper lobe (image 43 through 59). Upper abdomen: Limited view of the liver, kidneys, pancreas are unremarkable. Normal adrenal glands. Musculoskeletal: No  acute osseous abnormality. Review of the MIP images confirms the above findings. IMPRESSION: 1. No evidence acute pulmonary embolism. 2. Ground-glass opacities in the periphery of the LEFT upper lobe suggest mild pulmonary infection. 3. Mild mediastinal and hilar lymphadenopathy is likely reactive. 4. Interval enlargement of 2 small RIGHT upper lobe  pulmonary nodules. 5. With the findings of infection, adenopathy, and potentially enlarging nodules coupled with smoking history, recommend follow-up CT without contrast in 3 months to re-evaluate. Electronically Signed   By: Genevive Bi M.D.   On: 06/14/2015 11:39   US Renal  06/14/2015  CLINICAL DATA:  Renal insufficiency EXAM: RENAL / URINARY TRACT ULTRASOUND COMPLETE COMPARISON:  None. FINDINGS: Right Kidney: Length: 11.8 cm. Echogenicity within normal limits. No mass or hydronephrosis visualized. Left Kidney: Length: 12.2 cm. Echogenicity within normal limits. No mass or hydronephrosis visualized. Bladder: Appears normal for degree of bladder distention. IMPRESSION: No hydronephrosis or diagnostic renal calculus. Suboptimal exam due to patient's large body habitus. Electronically Signed   By: Natasha Mead M.D.   On: 06/14/2015 14:26   US Venous Img Lower Bilateral  06/14/2015  CLINICAL DATA:  Bilateral leg swelling EXAM: BILATERAL LOWER EXTREMITY VENOUS DOPPLER ULTRASOUND TECHNIQUE: Gray-scale sonography with graded compression, as well as color Doppler and duplex ultrasound were performed to evaluate the lower extremity deep venous systems from the level of the common femoral vein and including the common femoral, femoral, profunda femoral, popliteal and calf veins including the posterior tibial, peroneal and gastrocnemius veins when visible. The superficial great saphenous vein was also interrogated. Spectral Doppler was utilized to evaluate flow at rest and with distal augmentation maneuvers in the common femoral, femoral and popliteal veins. COMPARISON:  None. FINDINGS: RIGHT LOWER EXTREMITY Common Femoral Vein: No evidence of thrombus. Normal compressibility, respiratory phasicity and response to augmentation. Saphenofemoral Junction: No evidence of thrombus. Normal compressibility and flow on color Doppler imaging. Profunda Femoral Vein: No evidence of thrombus. Normal compressibility and flow  on color Doppler imaging. Femoral Vein: No evidence of thrombus. Normal compressibility, respiratory phasicity and response to augmentation. Popliteal Vein: No evidence of thrombus. Normal compressibility, respiratory phasicity and response to augmentation. Calf Veins: No evidence of thrombus. Normal compressibility and flow on color Doppler imaging. Superficial Great Saphenous Vein: No evidence of thrombus. Normal compressibility and flow on color Doppler imaging. Venous Reflux:  None. Other Findings:  None. LEFT LOWER EXTREMITY Common Femoral Vein: No evidence of thrombus. Normal compressibility, respiratory phasicity and response to augmentation. Saphenofemoral Junction: No evidence of thrombus. Normal compressibility and flow on color Doppler imaging. Profunda Femoral Vein: No evidence of thrombus. Normal compressibility and flow on color Doppler imaging. Femoral Vein: No evidence of thrombus. Normal compressibility, respiratory phasicity and response to augmentation. Popliteal Vein: No evidence of thrombus. Normal compressibility, respiratory phasicity and response to augmentation. Calf Veins: No evidence of thrombus. Normal compressibility and flow on color Doppler imaging. Superficial Great Saphenous Vein: No evidence of thrombus. Normal compressibility and flow on color Doppler imaging. Venous Reflux:  None. Other Findings: There is a left inguinal lymph node measures 2.8 x 1.9 cm. IMPRESSION: No evidence of deep venous thrombosis bilateral lower extremity. Left inguinal lymph node measures 2.8 x 1.9 cm. Electronically Signed   By: Natasha Mead M.D.   On: 06/14/2015 14:31   Dg Chest Port 1 View  06/15/2015  CLINICAL DATA:  Right-sided PICC line placement. EXAM: PORTABLE CHEST 1 VIEW COMPARISON:  06/13/2015 FINDINGS: Interval placement of right-sided PICC line with tip overlying the distal  SVC just above the cavoatrial junction. Lungs are adequately inflated with slight rounding hazy left infrahilar  opacification. No evidence of effusion. Cardiomediastinal silhouette and remainder the exam is unchanged. IMPRESSION: Slight worsening hazy left infrahilar opacification which may be due to infection. Right-sided PICC line with tip over the distal SVC. Electronically Signed   By: Elberta Fortis M.D.   On: 06/15/2015 12:23    EKG:  Orders placed or performed during the hospital encounter of 06/13/15  . EKG 12-Lead  . EKG 12-Lead  . ED EKG  . ED EKG    ASSESSMENT AND PLAN:  Principal Problem:   Hyponatremia Active Problems:   Chest pain at rest   Shortness of breath   Swelling   Cough   COPD (chronic obstructive pulmonary disease) with acute bronchitis (HCC)   Renal insufficiency   Pulmonary HTN (HCC)   Acute on chronic diastolic CHF (congestive heart failure) (HCC) 1. Acute respiratory distress due to COPD exacerbation as well as acute diastolic CHF, continue Pulmicort, DuoNeb's prednisone, add Lasix, follow clinically, follow ins and outs as well as weight 2. COPD exacerbation, continue patient on oxygen, azithromycin, Pulmicort, DuoNeb's, prednisone, appreciate pulmonologist input, get sputum cultures if possible, some improved clinically 3. Lower extremity edema , likely right-sided heart failure due to advanced COPD, echocardiogram revealed normal ejection fraction, however, elevated central venous, and pulmonary artery pressures, Doppler ultrasound showed no DVT 4. 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 3% sodium solution through central line only, following sodium levels every 4 hours, now will be receiving Lasix, following sodium levels closely 5. Renal insufficiency, some improved with rehydration, follow with Lasix, unremarkable renal ultrasound 6. Acute cystitis,  urinary cultures not done, continue levofloxacin orally 7. Elevated troponin, likely demand ischemia, echocardiogram showed normal ejection fraction, however, elevated central  venous as well as pulmonary artery pressures, now initiated on diuretic 8. Hyperglycemia. hemoglobin A1c 6.2 9. Tobacco abuse, discussed this patient for 4 minutes yesterday. Nicotine replacement therapy is initiated 10. Lower back pain. Initiate patient on the Percocet, follow clinically 11. Acute diastolic CHF, likely due to fluid overload, initiate diuretics, follow clinically, continue oxygen therapy for now for comfort. Wean to room air as tolerated  Management plans discussed with the patient, family and they are in agreement.   DRUG ALLERGIES:  Allergies  Allergen Reactions  . Tramadol Other (See Comments)    Heart palpatations  . Penicillins Rash    CODE STATUS:     Code Status Orders        Start     Ordered   06/14/15 0202  Full code   Continuous     06/14/15 0201    Advance Directive Documentation        Most Recent Value   Type of Advance Directive  Living will   Pre-existing out of facility DNR order (yellow form or pink MOST form)     "MOST" Form in Place?        TOTAL critical care TIME TAKING CARE OF THIS PATIENT: 40 minutes.  Discussed with intensivist, Dr. Lovena Le M.D on 06/15/2015 at 2:14 PM  Between 7am to 6pm - Pager - [phone]  After 6pm go to [email or web address] - password [email or web address] [facility]  [place]  Office  [phone]  CC: Primary care physician; [facility]

## 2015-06-15 NOTE — Progress Notes (Signed)
Pt transferred to 1A this AM. Decreased appetite, ongoing issue per pt. IV removed from R forearm d/t extravasation, edema noted to entire right arm. Elevated RUE on pillows. Pain well controlled, lidocaine patch to L hip. Labs drawn from R PICC, flushes w/ good blood return. Sodium level and BMP pending.

## 2015-06-16 DIAGNOSIS — R918 Other nonspecific abnormal finding of lung field: Secondary | ICD-10-CM

## 2015-06-16 DIAGNOSIS — J9621 Acute and chronic respiratory failure with hypoxia: Secondary | ICD-10-CM

## 2015-06-16 LAB — CBC
HCT: 26.9 % — ABNORMAL LOW (ref 35.0–47.0)
HEMOGLOBIN: 9 g/dL — AB (ref 12.0–16.0)
MCH: 37.9 pg — AB (ref 26.0–34.0)
MCHC: 33.7 g/dL (ref 32.0–36.0)
MCV: 112.7 fL — ABNORMAL HIGH (ref 80.0–100.0)
Platelets: 244 10*3/uL (ref 150–440)
RBC: 2.39 MIL/uL — ABNORMAL LOW (ref 3.80–5.20)
RDW: 19.9 % — AB (ref 11.5–14.5)
WBC: 11.5 10*3/uL — ABNORMAL HIGH (ref 3.6–11.0)

## 2015-06-16 LAB — BASIC METABOLIC PANEL
ANION GAP: 8 (ref 5–15)
Anion gap: 9 (ref 5–15)
BUN: 19 mg/dL (ref 6–20)
BUN: 19 mg/dL (ref 6–20)
CHLORIDE: 98 mmol/L — AB (ref 101–111)
CO2: 19 mmol/L — AB (ref 22–32)
CO2: 17 mmol/L — AB (ref 22–32)
CREATININE: 1.12 mg/dL — AB (ref 0.44–1.00)
Calcium: 7.9 mg/dL — ABNORMAL LOW (ref 8.9–10.3)
Calcium: 8 mg/dL — ABNORMAL LOW (ref 8.9–10.3)
Chloride: 101 mmol/L (ref 101–111)
Creatinine, Ser: 1.15 mg/dL — ABNORMAL HIGH (ref 0.44–1.00)
GFR calc Af Amer: >60 mL/min (ref >60)
GFR calc Af Amer: >60 mL/min (ref >60)
GFR calc non Af Amer: 56 mL/min — ABNORMAL LOW (ref >60)
GFR calc non Af Amer: 55 mL/min — ABNORMAL LOW (ref >60)
GLUCOSE: 135 mg/dL — AB (ref 65–99)
Glucose, Bld: 124 mg/dL — ABNORMAL HIGH (ref 65–99)
POTASSIUM: 5.1 mmol/L (ref 3.5–5.1)
Potassium: 4.6 mmol/L (ref 3.5–5.1)
SODIUM: 126 mmol/L — AB (ref 135–145)
Sodium: 126 mmol/L — ABNORMAL LOW (ref 135–145)

## 2015-06-16 LAB — GLUCOSE, CAPILLARY
GLUCOSE-CAPILLARY: 203 mg/dL — AB (ref 65–99)
GLUCOSE-CAPILLARY: 179 mg/dL — AB (ref 65–99)
Glucose-Capillary: 138 mg/dL — ABNORMAL HIGH (ref 65–99)
Glucose-Capillary: 119 mg/dL — ABNORMAL HIGH (ref 65–99)

## 2015-06-16 LAB — SODIUM
SODIUM: 125 mmol/L — AB (ref 135–145)
Sodium: 125 mmol/L — ABNORMAL LOW (ref 135–145)
Sodium: 124 mmol/L — ABNORMAL LOW (ref 135–145)

## 2015-06-16 MED ORDER — INFLUENZA VAC SPLIT QUAD 0.5 ML IM SUSY
0.5000 mL | PREFILLED_SYRINGE | INTRAMUSCULAR | Status: AC
  Administered 2015-06-17: 0.5 mL via INTRAMUSCULAR
  Filled 2015-06-16: qty 0.5

## 2015-06-16 NOTE — Progress Notes (Signed)
MEDICATION RELATED CONSULT NOTE - Follow Up   Pharmacy Consult for hypertonic saline monitoring Indication: hyponatremia   Patient Measurements: Height:  (172.7 cm) Weight: 203 lb 14.8 oz (92.5 kg) IBW/kg (Calculated) : 63.9  Vital Signs: Temp: 97.7 F (36.5 C) (11/27 2348) Temp Source: Oral (11/27 2348) BP: 98/42 mmHg (11/28 0019) Pulse Rate: 96 (11/27 2350)  Labs:  Recent Labs  06/13/15 2205 06/14/15 0053  06/14/15 0249  06/14/15 2042 06/15/15 0520 06/15/15 1827  WBC 14.8*  --   --  11.9*  --   --  12.2*  --   HGB 11.5*  --   --  10.5*  --   --  10.1*  --   HCT 32.7*  --   --  29.7*  --   --  29.6*  --   PLT 304  --   --  260  --   --  260  --   CREATININE 0.89  --   < > 0.98  < > 1.06* 0.96 1.17*  LABCREA  --  138  --   --   --   --   --   --   ALBUMIN  --   --   --   --   --  2.9*  --   --   PROT  --   --   --   --   --  6.6  --   --   AST  --   --   --   --   --  51*  --   --   ALT  --   --   --   --   --  15  --   --   ALKPHOS  --   --   --   --   --  68  --   --   BILITOT  --   --   --   --   --  1.8*  --   --   < > = values in this interval not displayed. Estimated Creatinine Clearance: 68.4 mL/min (by C-G formula based on Cr of 1.17).   Medical History: Past Medical History  Diagnosis Date  . Arthritis   . COPD (chronic obstructive pulmonary disease) (HCC)   . Gastric ulcer   . OSA on CPAP no ins - no cpap machine for the last 4 years  . Asthma   . Hypertension     Medications:  Infusions:  . sodium chloride (hypertonic) 40 mL/hr (06/16/15 0137)    Assessment: 49 yof cc SOB being worked up for SOB/chest pain presents with serum sodium 112 mEq/L. Starting hypertonic saline.  1/26 0800 sodium 115 11/26 1358 sodium 115  Goal of Therapy:  1. Increase serum sodium 4 to 6 mEq/L as soon as possible 2. Do not exceed increase of 8 mEq/L per day  Plan:  1126 2042 sodium level 118 mEq/L. Total increase of 6 mEq/L in past 24 hours - safe rate  of correction. Currently running sodium chloride 3% 30 mL/hr. Continue current rate and will check sodium again in 4 hours, 06/15/15 at 0100. Pharmacy will continue to follow.  1127 0520 sodium level 121 mEq/L. Total increase of 7 mEq/L in past 24 hours and 3 mEq in past 4 hours. Currently running sodium choride 3% 30 mL/hr. Continue current rate and will check sodium again in 4 hours, 06/15/15 at 0900. If sodium rises above 123 mEq/L on next check would decrease  rate to 20 mL/hr. Continue Q4H checks. Patient needs central line as soon as possible to continue receiving hypertonic saline.   1127 0909 sodiu level 120 mEq/L. Continue current rate and will check sodium again in 4 hours, 06/15/15 at 1300. If sodium rises above 123 mEq/L on next check would decrease rate to 20 mL/hr. Continue Q4H checks. Patient needs central line as soon as possible to continue receiving hypertonic saline.   1127  Na @ 18:27 = 120 .    Continue this pt on current rate of 40 ml/hr.   1128 0252 sodium level 125 mEq/L. Total increase of 7 mEq/L in past 24 hours. Currently running sodium choride 3% 40 mL/hr. Continue current rate and will check sodium again in 4 hours, 06/16/15 at 0500. Continue Q4H checks.  Carola FrostNathan A Gerhardt Gleed, Pharm.D., BCPS Clinical Pharmacist  06/16/2015 3:29 AM

## 2015-06-16 NOTE — Progress Notes (Signed)
MEDICATION RELATED CONSULT NOTE - Follow Up   Pharmacy Consult for hypertonic saline monitoring Indication: hyponatremia   Patient Measurements: Height: 5\' 8"  (172.7 cm) Weight: 204 lb 6.4 oz (92.715 kg) IBW/kg (Calculated) : 63.9  Vital Signs: Temp: 97.8 F (36.6 C) (11/28 1253) Temp Source: Oral (11/28 1253) BP: 98/44 mmHg (11/28 1253) Pulse Rate: 90 (11/28 1253)  Labs:  Recent Labs  06/14/15 0053  06/14/15 0249  06/14/15 2042 06/15/15 0520 06/15/15 1827 06/16/15 0655  WBC  --   --  11.9*  --   --  12.2*  --  11.5*  HGB  --   --  10.5*  --   --  10.1*  --  9.0*  HCT  --   --  29.7*  --   --  29.6*  --  26.9*  PLT  --   --  260  --   --  260  --  244  CREATININE  --   < > 0.98  < > 1.06* 0.96 1.17* 1.12*  LABCREA 138  --   --   --   --   --   --   --   ALBUMIN  --   --   --   --  2.9*  --   --   --   PROT  --   --   --   --  6.6  --   --   --   AST  --   --   --   --  51*  --   --   --   ALT  --   --   --   --  15  --   --   --   ALKPHOS  --   --   --   --  68  --   --   --   BILITOT  --   --   --   --  1.8*  --   --   --   < > = values in this interval not displayed. Estimated Creatinine Clearance: 71.5 mL/min (by C-G formula based on Cr of 1.12).   Medical History: Past Medical History  Diagnosis Date  . Arthritis   . COPD (chronic obstructive pulmonary disease) (HCC)   . Gastric ulcer   . OSA on CPAP no ins - no cpap machine for the last 4 years  . Asthma   . Hypertension     Medications:  Infusions:  . sodium chloride (hypertonic) 50 mL/hr (06/16/15 1413)    Assessment: 49 yof cc SOB being worked up for SOB/chest pain presents with serum sodium 112 mEq/L. Starting hypertonic saline.  11/28 0700 Na level: 126 11/28 1027 Na level: 125 11/28 1352 Na level: 124   Goal of Therapy:  1. Increase serum sodium 4 to 6 mEq/L as soon as possible 2. Do not exceed increase of 8 mEq/L per day  Plan:  Last serum sodium of 124.  Hypertonic saline rate  increased to 50 mL/hr.  Continue current rate.  Serum sodiums ordered q4h.  Next sodium is for 1800 today.  Nephrology consulted.   Pharmacy will continue to follow.  Clarisa Schoolsrystal Firman Petrow, PharmD Clinical Pharmacist 06/16/2015

## 2015-06-16 NOTE — Progress Notes (Signed)
Patient: Erica [name] / Admit Date: 06/13/2015 / Date of Encounter: 06/16/2015, 3:51 PM   Subjective: Feeling better. Less SOB. No chest pain or palpitations. Sodium remains low at 125. On 3% saline.   Review of Systems: Review of Systems  Constitutional: Positive for weight loss and malaise/fatigue. Negative for fever, chills and diaphoresis.  HENT: Negative for congestion.   Eyes: Negative for discharge and redness.  Respiratory: Positive for cough, shortness of breath and wheezing. Negative for hemoptysis and sputum production.   Cardiovascular: Positive for leg swelling. Negative for chest pain, palpitations, orthopnea, claudication and PND.  Gastrointestinal: Negative for nausea, vomiting and abdominal pain.  Musculoskeletal: Negative for falls.  Skin: Negative for rash.  Neurological: Positive for weakness. Negative for sensory change, speech change, focal weakness and loss of consciousness.  Endo/Heme/Allergies: Does not bruise/bleed easily.  Psychiatric/Behavioral: The patient is not nervous/anxious.      Objective: Telemetry: NSR, 90's Physical Exam: Blood pressure 98/44, pulse 90, temperature 97.8 F (36.6 C), temperature source Oral, resp. rate 18, height  (1.727 m), weight 204 lb 6.4 oz (92.715 kg), last menstrual period 03/13/2015, SpO2 96 %. Body mass index is 31.09 kg/(m^2). General: Well developed, well nourished, in no acute distress. Head: Normocephalic, atraumatic, sclera non-icteric, no xanthomas, nares are without discharge. Neck: Negative for carotid bruits. JVP not elevated. Lungs: Decreased breath sounds throughout. Breathing is unlabored. Heart: RRR S1 S2 without murmurs, rubs, or gallops.  Abdomen: Obese, soft, non-tender, non-distended with normoactive bowel sounds. No rebound/guarding. Extremities: No clubbing or cyanosis. Mild pitting edema along the ankles. Distal pedal pulses are 2+ and equal bilaterally. Neuro: Alert and oriented X 3. Moves  all extremities spontaneously. Psych:  Responds to questions appropriately with a normal affect.   Intake/Output Summary (Last 24 hours) at 06/16/15 1551 Last data filed at 06/16/15 1220  Gross per 24 hour  Intake 2320.5 ml  Output    100 ml  Net 2220.5 ml    Inpatient Medications:  . aspirin EC  81 mg Oral Daily  . atorvastatin  20 mg Oral q1800  . budesonide  2 mL Inhalation BID  . clopidogrel  75 mg Oral Q breakfast  . enoxaparin (LOVENOX) injection  40 mg Subcutaneous Q24H  . [START ON 06/17/2015] Influenza vac split quadrivalent PF  0.5 mL Intramuscular Tomorrow-1000  . insulin aspart  0-9 Units Subcutaneous TID AC & HS  . ipratropium-albuterol  3 mL Nebulization Q6H  . levofloxacin  500 mg Oral QHS  . lidocaine  1 patch Transdermal Q24H  . magic mouthwash  10 mL Oral TID  . nicotine  21 mg Transdermal Daily  . nitroGLYCERIN  0.5 inch Topical 4 times per day  . potassium chloride  20 mEq Oral Daily   Infusions:  . sodium chloride (hypertonic) 50 mL/hr (06/16/15 1413)    Labs:  Recent Labs  06/15/15 1827  06/16/15 0655 06/16/15 1027 06/16/15 1352  NA 120*  < > 126* 125* 124*  K 4.9  --  4.6  --   --   CL 93*  --  98*  --   --   CO2 18*  --  19*  --   --   GLUCOSE 192*  --  124*  --   --   BUN 20  --  19  --   --   CREATININE 1.17*  --  1.12*  --   --   CALCIUM 7.9*  --  7.9*  --   --   < > =  values in this interval not displayed.  Recent Labs  06/14/15 2042  AST 51*  ALT 15  ALKPHOS 68  BILITOT 1.8*  PROT 6.6  ALBUMIN 2.9*    Recent Labs  06/15/15 0520 06/16/15 0655  WBC 12.2* 11.5*  HGB 10.1* 9.0*  HCT 29.6* 26.9*  MCV 111.6* 112.7*  PLT 260 244    Recent Labs  06/13/15 2205 06/14/15 0249 06/14/15 0800 06/14/15 1358  TROPONINI 0.06* <0.03 <0.03 <0.03   Invalid input(s): POCBNP  Recent Labs  06/14/15 1358  HGBA1C 6.2*     Weights: Filed Weights   06/14/15 0158 06/15/15 0618 06/16/15 0459  Weight: 204 lb 9.4 oz (92.8 kg)  203 lb 14.8 oz (92.5 kg) 204 lb 6.4 oz (92.715 kg)     Radiology/Studies:  Dg Chest 2 View  06/13/2015  CLINICAL DATA:  Shortness of breath. Asthma. COPD. Swelling of the feet and legs. EXAM: CHEST  2 VIEW COMPARISON:  03/23/2013 FINDINGS: Indistinct pulmonary vasculature, with slight interstitial accentuation in the lung bases. Is is heart size within normal limits. No pleural effusion. Mild bilateral airway thickening. IMPRESSION: 1. Airway thickening is present, suggesting bronchitis or reactive airways disease. There is faint associated interstitial accentuation in the lung bases. No overt airspace opacity. Electronically Signed   By: Gaylyn Rong M.D.   On: 06/13/2015 22:34   Ct Angio Chest Pe W/cm &/or Wo Cm  06/14/2015  CLINICAL DATA:  Short of breath and chest tightness. Chest pain for several weeks with productive cough. Concern pulmonary embolism. Smoking history. EXAM: CT ANGIOGRAPHY CHEST WITH CONTRAST TECHNIQUE: Multidetector CT imaging of the chest was performed using the standard protocol during bolus administration of intravenous contrast. Multiplanar CT image reconstructions and MIPs were obtained to evaluate the vascular anatomy. CONTRAST:  75mL OMNIPAQUE IOHEXOL 350 MG/ML SOLN COMPARISON:  CT 03/23/2013 FINDINGS: Mediastinum/Nodes: No filling defects within the pulmonary suggest acute pulmonary embolism. No acute findings aorta great vessels. Mildly enlarged paratracheal lymph nodes up to 12 mm. Subcarinal lymph node similar 13 mm. There is mild bilateral hilar lymphadenopathy. No pericardial fluid.  Esophagus is normal. Lungs/Pleura: Small nodule in the RIGHT upper lobe measures 5 mm on image 62, series 6. nodule appears slightly increased from 3 mm on prior. 3 mm RIGHT upper lobe nodule on image 67, series 6 is increased from 2 mm on prior. There is peripheral ground-glass opacity in the LEFT upper lobe (image 43 through 59). Upper abdomen: Limited view of the liver, kidneys,  pancreas are unremarkable. Normal adrenal glands. Musculoskeletal: No acute osseous abnormality. Review of the MIP images confirms the above findings. IMPRESSION: 1. No evidence acute pulmonary embolism. 2. Ground-glass opacities in the periphery of the LEFT upper lobe suggest mild pulmonary infection. 3. Mild mediastinal and hilar lymphadenopathy is likely reactive. 4. Interval enlargement of 2 small RIGHT upper lobe pulmonary nodules. 5. With the findings of infection, adenopathy, and potentially enlarging nodules coupled with smoking history, recommend follow-up CT without contrast in 3 months to re-evaluate. Electronically Signed   By: Genevive Bi M.D.   On: 06/14/2015 11:39   US Renal  06/14/2015  CLINICAL DATA:  Renal insufficiency EXAM: RENAL / URINARY TRACT ULTRASOUND COMPLETE COMPARISON:  None. FINDINGS: Right Kidney: Length: 11.8 cm. Echogenicity within normal limits. No mass or hydronephrosis visualized. Left Kidney: Length: 12.2 cm. Echogenicity within normal limits. No mass or hydronephrosis visualized. Bladder: Appears normal for degree of bladder distention. IMPRESSION: No hydronephrosis or diagnostic renal calculus. Suboptimal exam due to patient's  large body habitus. Electronically Signed   By: Natasha Mead M.D.   On: 06/14/2015 14:26   US Venous Img Lower Bilateral  06/14/2015  CLINICAL DATA:  Bilateral leg swelling EXAM: BILATERAL LOWER EXTREMITY VENOUS DOPPLER ULTRASOUND TECHNIQUE: Gray-scale sonography with graded compression, as well as color Doppler and duplex ultrasound were performed to evaluate the lower extremity deep venous systems from the level of the common femoral vein and including the common femoral, femoral, profunda femoral, popliteal and calf veins including the posterior tibial, peroneal and gastrocnemius veins when visible. The superficial great saphenous vein was also interrogated. Spectral Doppler was utilized to evaluate flow at rest and with distal augmentation  maneuvers in the common femoral, femoral and popliteal veins. COMPARISON:  None. FINDINGS: RIGHT LOWER EXTREMITY Common Femoral Vein: No evidence of thrombus. Normal compressibility, respiratory phasicity and response to augmentation. Saphenofemoral Junction: No evidence of thrombus. Normal compressibility and flow on color Doppler imaging. Profunda Femoral Vein: No evidence of thrombus. Normal compressibility and flow on color Doppler imaging. Femoral Vein: No evidence of thrombus. Normal compressibility, respiratory phasicity and response to augmentation. Popliteal Vein: No evidence of thrombus. Normal compressibility, respiratory phasicity and response to augmentation. Calf Veins: No evidence of thrombus. Normal compressibility and flow on color Doppler imaging. Superficial Great Saphenous Vein: No evidence of thrombus. Normal compressibility and flow on color Doppler imaging. Venous Reflux:  None. Other Findings:  None. LEFT LOWER EXTREMITY Common Femoral Vein: No evidence of thrombus. Normal compressibility, respiratory phasicity and response to augmentation. Saphenofemoral Junction: No evidence of thrombus. Normal compressibility and flow on color Doppler imaging. Profunda Femoral Vein: No evidence of thrombus. Normal compressibility and flow on color Doppler imaging. Femoral Vein: No evidence of thrombus. Normal compressibility, respiratory phasicity and response to augmentation. Popliteal Vein: No evidence of thrombus. Normal compressibility, respiratory phasicity and response to augmentation. Calf Veins: No evidence of thrombus. Normal compressibility and flow on color Doppler imaging. Superficial Great Saphenous Vein: No evidence of thrombus. Normal compressibility and flow on color Doppler imaging. Venous Reflux:  None. Other Findings: There is a left inguinal lymph node measures 2.8 x 1.9 cm. IMPRESSION: No evidence of deep venous thrombosis bilateral lower extremity. Left inguinal lymph node measures  2.8 x 1.9 cm. Electronically Signed   By: Natasha Mead M.D.   On: 06/14/2015 14:31   Dg Chest Port 1 View  06/15/2015  CLINICAL DATA:  Right-sided PICC line placement. EXAM: PORTABLE CHEST 1 VIEW COMPARISON:  06/13/2015 FINDINGS: Interval placement of right-sided PICC line with tip overlying the distal SVC just above the cavoatrial junction. Lungs are adequately inflated with slight rounding hazy left infrahilar opacification. No evidence of effusion. Cardiomediastinal silhouette and remainder the exam is unchanged. IMPRESSION: Slight worsening hazy left infrahilar opacification which may be due to infection. Right-sided PICC line with tip over the distal SVC. Electronically Signed   By: Elberta Fortis M.D.   On: 06/15/2015 12:23     Assessment and Plan   1. Respiratory distress/shortness of breath: -Improving -Suspect upper respiratory infection, unable to exclude acute on chronic diastolic CHF, pulmonary hypertension -Moderately elevated right heart pressures seen on echocardiogram, RVSP 56 -She has been on IV fluids for low sodium, will be continued per Renal given persisting hyponatremia  -If shortness of breath and cough get worse, will likely need Lasix, though soft BP preclude usage at this time -Currently on antibiotic, COPD regimen, followed by pulmonary -Once her symptoms have improved, could consider Myoview to rule out ischemia -High risk  of underlying coronary artery disease given long history of smoking -Patient has ruled out with troponin negative x 3, there is no indication for continuing Plavix in this patient in this setting, will discontinue   2. Acute on chronic diastolic CHF: -On nitro paste leading to soft BP -May benefit from discontinuation of nitro paste, following BP, if they improve could start Tolvaptan for diuresis in the setting of hyponatremia, would reassess for this change 11/29 if not made by MD on 11/28 -Echo showing Elevated right heart pressures likely  secondary to underlying lung disease, diastolic dysfunction  3. COPD exacerbation: -Managed by pulmonary, -Antibiotic, nebulizer -If no improvement of symptoms, may need steroids  4. Hyponatremia: -Etiology unclear, she does admit to poor diet given her thrush -Not on any medications that should have contributed to low sodium  -Started on hypertonic saline with improvement of sodium -Could use tolvaptan as above -Renal following  5. Leg edema: -Improved -Patient reports having leg swelling for several months -This may be consistent with elevated right heart pressures -May need Lasix for symptom relief, shortness of breath, cough consistent with elevated right heart pressures  6. Thrush: -Long history of thrush over the past several months, previously treated with swish and swallow as well as nystatin got better but then came back -May have started with steroid inhaler,QVAR -Will defer management to medical team  7. Chronic back pain: -Per IM/PCP    Signed, Eula Listenyan Merissa Renwick, PA-C Pager: [phone] 06/16/2015, 3:51 PM

## 2015-06-16 NOTE — Progress Notes (Signed)
MEDICATION RELATED CONSULT NOTE - Follow Up   Pharmacy Consult for hypertonic saline monitoring Indication: hyponatremia   Patient Measurements: Height: 5\' 8"  (172.7 cm) Weight: 204 lb 6.4 oz (92.715 kg) IBW/kg (Calculated) : 63.9  Vital Signs: Temp: 97.9 F (36.6 C) (11/28 0725) Temp Source: Oral (11/28 0725) BP: 94/45 mmHg (11/28 0725) Pulse Rate: 89 (11/28 0725)  Labs:  Recent Labs  06/14/15 0053  06/14/15 0249  06/14/15 2042 06/15/15 0520 06/15/15 1827 06/16/15 0655  WBC  --   --  11.9*  --   --  12.2*  --  11.5*  HGB  --   --  10.5*  --   --  10.1*  --  9.0*  HCT  --   --  29.7*  --   --  29.6*  --  26.9*  PLT  --   --  260  --   --  260  --  244  CREATININE  --   < > 0.98  < > 1.06* 0.96 1.17* 1.12*  LABCREA 138  --   --   --   --   --   --   --   ALBUMIN  --   --   --   --  2.9*  --   --   --   PROT  --   --   --   --  6.6  --   --   --   AST  --   --   --   --  51*  --   --   --   ALT  --   --   --   --  15  --   --   --   ALKPHOS  --   --   --   --  68  --   --   --   BILITOT  --   --   --   --  1.8*  --   --   --   < > = values in this interval not displayed. Estimated Creatinine Clearance: 71.5 mL/min (by C-G formula based on Cr of 1.12).   Medical History: Past Medical History  Diagnosis Date  . Arthritis   . COPD (chronic obstructive pulmonary disease) (HCC)   . Gastric ulcer   . OSA on CPAP no ins - no cpap machine for the last 4 years  . Asthma   . Hypertension     Medications:  Infusions:  . sodium chloride (hypertonic) 40 mL/hr (06/16/15 0137)    Assessment: 49 yof cc SOB being worked up for SOB/chest pain presents with serum sodium 112 mEq/L. Starting hypertonic saline.  11/28 0700 Na level: 126 11/28 1027 Na level: 125   Goal of Therapy:  1. Increase serum sodium 4 to 6 mEq/L as soon as possible 2. Do not exceed increase of 8 mEq/L per day  Plan:  Last serum sodium of 125.  Hypertonic saline infusing at 40 mL/hr.  Continue  current rate.  Serum sodiums ordered q4h.  Next sodium is for 1400 today.  Nephrology consulted.   Pharmacy will continue to follow.  Clarisa Schoolsrystal Tywana Robotham, PharmD Clinical Pharmacist 06/16/2015

## 2015-06-16 NOTE — Progress Notes (Signed)
[facility]  ROUNDING NOTE   Subjective:  PICC line was placed in the right upper extremity. Serum sodium currently 125. Patient remains on 3% saline   Objective:  Vital signs in last 24 hours:  Temp:  [97.5 F (36.4 C)-98.1 F (36.7 C)] 97.8 F (36.6 C) (11/28 1253) Pulse Rate:  [89-97] 90 (11/28 1253) Resp:  [18-20] 18 (11/28 1253) BP: (94-123)/(40-53) 98/44 mmHg (11/28 1253) SpO2:  [94 %-100 %] 96 % (11/28 1253) Weight:  [92.715 kg (204 lb 6.4 oz)] 92.715 kg (204 lb 6.4 oz) (11/28 0459)  Weight change: 0.215 kg (7.6 oz) Filed Weights   06/14/15 0158 06/15/15 0618 06/16/15 0459  Weight: 92.8 kg (204 lb 9.4 oz) 92.5 kg (203 lb 14.8 oz) 92.715 kg (204 lb 6.4 oz)    Intake/Output: I/O last 3 completed shifts: In: 2401.8 [P.O.:1362; I.V.:1039.8] Out: 450 [Urine:450]   Intake/Output this shift:  Total I/O In: 240 [P.O.:240] Out: 100 [Urine:100]  Physical Exam: General: NAD, resting in bed   Head: Normocephalic, atraumatic. Moist oral mucosal membranes  Eyes: Anicteric  Neck: Supple, trachea midline  Lungs:  Clear to auscultation  Heart: Regular rate and rhythm  Abdomen:  Soft, nontender, bowel sounds present   Extremities:  trace peripheral edema.  Neurologic: Nonfocal, moving all four extremities  Skin: No lesions       Basic Metabolic Panel:  Recent Labs Lab 06/14/15 1812 06/14/15 2042  06/15/15 0520  06/15/15 1827 06/15/15 2059 06/16/15 0252 06/16/15 0655 06/16/15 1027  NA 115* 118*  < > 121*  < > 120* 125* 125* 126* 125*  K 4.3 4.3  --  3.7  --  4.9  --   --  4.6  --   CL 86* 87*  --  90*  --  93*  --   --  98*  --   CO2 18* 16*  --  20*  --  18*  --   --  19*  --   GLUCOSE 201* 195*  --  112*  --  192*  --   --  124*  --   BUN 19 20  --  20  --  20  --   --  19  --   CREATININE 1.10* 1.06*  --  0.96  --  1.17*  --   --  1.12*  --   CALCIUM 7.9* 8.0*  --  8.1*  --  7.9*  --   --  7.9*  --   < > = values in this interval not  displayed.  Liver Function Tests:  Recent Labs Lab 06/14/15 2042  AST 51*  ALT 15  ALKPHOS 68  BILITOT 1.8*  PROT 6.6  ALBUMIN 2.9*   No results for input(s): LIPASE, AMYLASE in the last 168 hours. No results for input(s): AMMONIA in the last 168 hours.  CBC:  Recent Labs Lab 06/13/15 2205 06/14/15 0249 06/15/15 0520 06/16/15 0655  WBC 14.8* 11.9* 12.2* 11.5*  HGB 11.5* 10.5* 10.1* 9.0*  HCT 32.7* 29.7* 29.6* 26.9*  MCV 111.6* 110.4* 111.6* 112.7*  PLT 304 260 260 244    Cardiac Enzymes:  Recent Labs Lab 06/13/15 2205 06/14/15 0249 06/14/15 0800 06/14/15 1358  TROPONINI 0.06* <0.03 <0.03 <0.03    BNP: Invalid input(s): POCBNP  CBG:  Recent Labs Lab 06/15/15 1044 06/15/15 1543 06/15/15 2106 06/16/15 0722 06/16/15 1129  GLUCAP 284* 223* 117* 138* 203*    Microbiology: Results for orders placed or performed during the  hospital encounter of 06/13/15  MRSA PCR Screening     Status: None   Collection Time: 06/14/15  2:01 AM  Result Value Ref Range Status   MRSA by PCR NEGATIVE NEGATIVE Final    Comment:        The GeneXpert MRSA Assay (FDA approved for NASAL specimens only), is one component of a comprehensive MRSA colonization surveillance program. It is not intended to diagnose MRSA infection nor to guide or monitor treatment for MRSA infections.   Culture, blood (routine x 2)     Status: None (Preliminary result)   Collection Time: 06/14/15  8:56 AM  Result Value Ref Range Status   Specimen Description BLOOD  BLD RIGHT ARM  Final   Special Requests   Final    BOTTLES DRAWN AEROBIC AND ANAEROBIC  7CC AERO, 4CC ANAERO   Culture NO GROWTH 2 DAYS  Final   Report Status PENDING  Incomplete  Culture, blood (routine x 2)     Status: None (Preliminary result)   Collection Time: 06/14/15  9:04 AM  Result Value Ref Range Status   Specimen Description BLOOD RIGHT ASSIST CONTROL  Final   Special Requests   Final    BOTTLES DRAWN AEROBIC AND  ANAEROBIC  4CC AERO, 8CC ANAERO   Culture NO GROWTH 2 DAYS  Final   Report Status PENDING  Incomplete    Coagulation Studies: No results for input(s): LABPROT, INR in the last 72 hours.  Urinalysis:  Recent Labs  06/14/15 0053  COLORURINE AMBER*  LABSPEC 1.012  PHURINE 5.0  GLUCOSEU NEGATIVE  HGBUR 1+*  BILIRUBINUR NEGATIVE  KETONESUR NEGATIVE  PROTEINUR NEGATIVE  NITRITE NEGATIVE  LEUKOCYTESUR 3+*      Imaging: Dg Chest Port 1 View  06/15/2015  CLINICAL DATA:  Right-sided PICC line placement. EXAM: PORTABLE CHEST 1 VIEW COMPARISON:  06/13/2015 FINDINGS: Interval placement of right-sided PICC line with tip overlying the distal SVC just above the cavoatrial junction. Lungs are adequately inflated with slight rounding hazy left infrahilar opacification. No evidence of effusion. Cardiomediastinal silhouette and remainder the exam is unchanged. IMPRESSION: Slight worsening hazy left infrahilar opacification which may be due to infection. Right-sided PICC line with tip over the distal SVC. Electronically Signed   By: Elberta Fortisaniel  Boyle M.D.   On: 06/15/2015 12:23     Medications:   . sodium chloride (hypertonic) 40 mL/hr (06/16/15 1353)   . aspirin EC  81 mg Oral Daily  . atorvastatin  20 mg Oral q1800  . budesonide  2 mL Inhalation BID  . clopidogrel  75 mg Oral Q breakfast  . enoxaparin (LOVENOX) injection  40 mg Subcutaneous Q24H  . furosemide  20 mg Oral Daily  . [START ON 06/17/2015] Influenza vac split quadrivalent PF  0.5 mL Intramuscular Tomorrow-1000  . insulin aspart  0-9 Units Subcutaneous TID AC & HS  . ipratropium-albuterol  3 mL Nebulization Q6H  . levofloxacin  500 mg Oral QHS  . lidocaine  1 patch Transdermal Q24H  . magic mouthwash  10 mL Oral TID  . nicotine  21 mg Transdermal Daily  . nitroGLYCERIN  0.5 inch Topical 4 times per day  . potassium chloride  20 mEq Oral Daily   acetaminophen, albuterol, ipratropium-albuterol, nicotine polacrilex,  nitroGLYCERIN, ondansetron (ZOFRAN) IV, oxyCODONE-acetaminophen  Assessment/ Plan:  49 y.o. female with a PMHx of COPD, gastric ulcer, osteoarthritis, tobacco abuse, who [facility] for evaluation of shortness of breath and cough.  1. Hyponatremia.  Urine Na was very low at < 10.   2. Oral thrush.  Improved 3. Urinary tract infection.  Plan:  erum sodium remains low at 125.  We will increase 3% saline to 50 cc per hour.  Continue serum sodium checks every 4 hours.  We will hold furosemide at this point in time. Oral thrush appears to be improved as well.  The patient also continue on Levaquin as she was found to have pyuria and hematuria.   LOS: 2 Rey Fors 11/28/20162:00 PM

## 2015-06-16 NOTE — Progress Notes (Signed)
Feeling better Still dyspneic with minimal exertion No fever, cough, hemoptysis  Filed Vitals:   06/16/15 0458 06/16/15 0459 06/16/15 0725 06/16/15 1253  BP: 97/52  94/45 98/44  Pulse: 93  89 90  Temp:   97.9 F (36.6 C) 97.8 F (36.6 C)  TempSrc:   Oral Oral  Resp:   18 18  Height:      Weight:  204 lb 6.4 oz (92.715 kg)    SpO2:   99% 96%   Appears chronically ill No overt distress No JVD noted Bibasilar crackles, no wheezes RRR s M NABS Trace symmetric LE edema  BMP Latest Ref Rng 06/16/2015 06/16/2015 06/16/2015  Glucose 65 - 99 mg/dL - - 829(F124(H)  BUN 6 - 20 mg/dL - - 19  Creatinine 6.210.44 - 1.00 mg/dL - - 3.08(M1.12(H)  Sodium 578135 - 145 mmol/L 124(L) 125(L) 126(L)  Potassium 3.5 - 5.1 mmol/L - - 4.6  Chloride 101 - 111 mmol/L - - 98(L)  CO2 22 - 32 mmol/L - - 19(L)  Calcium 8.9 - 10.3 mg/dL - - 7.9(L)    CBC Latest Ref Rng 06/16/2015 06/15/2015 06/14/2015  WBC 3.6 - 11.0 K/uL 11.5(H) 12.2(H) 11.9(H)  Hemoglobin 12.0 - 16.0 g/dL 9.0(L) 10.1(L) 10.5(L)  Hematocrit 35.0 - 47.0 % 26.9(L) 29.6(L) 29.7(L)  Platelets 150 - 440 K/uL 244 260 260    No new CXR - prior films reviewed  CT chest 11/26  IMPRESSION: 1. No evidence acute pulmonary embolism. 2. Ground-glass opacities in the periphery of the LEFT upper lobe suggest mild pulmonary infection. 3. Mild mediastinal and hilar lymphadenopathy is likely reactive. 4. Interval enlargement of 2 small RIGHT upper lobe pulmonary nodules. 5. With the findings of infection, adenopathy, and potentially enlarging nodules coupled with smoking history, recommend follow-up CT without contrast in 3 months to re-evaluate.  IMPRESSION: Acute respiratory failure Heavy smoker Patchy bilateral infiltrates - inflammatory vs edema Pulmonary nodules - indeterminant in appearance Hyponatremia - worrisome in setting of pulmonary nodules  PLAN/REC: Cont current Rx Counseled emphatically re: need for smoking cessation Diuresis to extent  permitted by BP and renal function Recheck CXR at time of discharge She should have F/U with Pulm Medicine after discharge Will need repeat CT chest in 3 months to follow lung nodules  Billy Fischeravid Simonds, MD PCCM service Mobile [phone] Pager [phone]

## 2015-06-16 NOTE — Progress Notes (Signed)
[facility] - [place] at [facility]   PATIENT NAME: Erica [name]    MR#:  [id number]  DATE OF BIRTH:  02-Jan-1966  SUBJECTIVE:  CHIEF COMPLAINT:   Chief Complaint  Patient presents with  . Shortness of Breath   patient is 49 year old Caucasian female with history of COPD, ongoing tobacco abuse who presents with complaints of shortness of breath, chest tightness. On arrival, she was noted to have mild elevation of troponin and very low sodium level at 112. Denies any neurologic symptoms including seizures, tingling or numbness or confusion. on 3% sodium solution with minimal improvement of her sodium levels. 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  Sodium level improved to 125.  Review of Systems  Constitutional: Negative for fever, chills and weight loss.  HENT: Negative for congestion.   Eyes: Negative for blurred vision and double vision.  Respiratory: Positive for cough and shortness of breath. Negative for sputum production and wheezing.   Cardiovascular: Negative for chest pain, palpitations, orthopnea, leg swelling and PND.  Gastrointestinal: Negative for nausea, vomiting, abdominal pain, diarrhea, constipation and blood in stool.  Genitourinary: Negative for dysuria, urgency, frequency and hematuria.  Musculoskeletal: Negative for falls.  Neurological: Negative for dizziness, tremors, focal weakness and headaches.  Endo/Heme/Allergies: Does not bruise/bleed easily.  Psychiatric/Behavioral: Negative for depression. The patient does not have insomnia.     VITAL SIGNS: Blood pressure 98/45, pulse 90, temperature 98 F (36.7 C), temperature source Oral, resp. rate 18, height  (1.727 m), weight 92.715 kg (204 lb 6.4 oz), last menstrual period 03/13/2015, SpO2 96 %.  PHYSICAL EXAMINATION:   GENERAL:  49 y.o.-year-old patient lying in the bed moderate respiratory distress. Tachypneic even slightest exertion or talking.   Looking older than stated age EYES: Pupils equal, round, reactive to light and accommodation. No scleral icterus. Extraocular muscles intact.  HEENT: Head atraumatic, normocephalic. Oropharynx and nasopharynx clear.  NECK:  Supple, no jugular venous distention. No thyroid enlargement, no tenderness.  LUNGS: Some better air entrance overall bilaterally , bilateral crackles, especially in the right base posteriorly , no wheezing, . Using accessory muscles of respiration with exertion.  CARDIOVASCULAR: S1, S2 normal. No murmurs, rubs, or gallops.  ABDOMEN: Soft, nontender, nondistended. Bowel sounds present. No organomegaly or mass.  EXTREMITIES: 1+ lower extremity and pedal edema, no cyanosis, or clubbing. Dorsalis pedis pulses are palpable at 1+ NEUROLOGIC: Cranial nerves II through XII are intact. Muscle strength 5/5 in all extremities. Sensation intact. Gait not checked.  PSYCHIATRIC: The patient is alert and oriented x 3.  SKIN: No obvious rash, lesion, or ulcer.   ORDERS/RESULTS REVIEWED:   CBC  Recent Labs Lab 06/13/15 2205 06/14/15 0249 06/15/15 0520 06/16/15 0655  WBC 14.8* 11.9* 12.2* 11.5*  HGB 11.5* 10.5* 10.1* 9.0*  HCT 32.7* 29.7* 29.6* 26.9*  PLT 304 260 260 244  MCV 111.6* 110.4* 111.6* 112.7*  MCH 39.2* 39.1* 37.9* 37.9*  MCHC 35.1 35.4 34.0 33.7  RDW 19.2* 19.5* 19.6* 19.9*   ------------------------------------------------------------------------------------------------------------------  Chemistries   Recent Labs Lab 06/14/15 2042  06/15/15 0520  06/15/15 1827  06/16/15 0252 06/16/15 0655 06/16/15 1027 06/16/15 1352 06/16/15 1909  NA 118*  < > 121*  < > 120*  < > 125* 126* 125* 124* 126*  K 4.3  --  3.7  --  4.9  --   --  4.6  --   --  5.1  CL 87*  --  90*  --  93*  --   --  98*  --   --  101  CO2 16*  --  20*  --  18*  --   --  19*  --   --  17*  GLUCOSE 195*  --  112*  --  192*  --   --  124*  --   --  135*  BUN 20  --  20  --  20  --   --  19  --    --  19  CREATININE 1.06*  --  0.96  --  1.17*  --   --  1.12*  --   --  1.15*  CALCIUM 8.0*  --  8.1*  --  7.9*  --   --  7.9*  --   --  8.0*  AST 51*  --   --   --   --   --   --   --   --   --   --   ALT 15  --   --   --   --   --   --   --   --   --   --   ALKPHOS 68  --   --   --   --   --   --   --   --   --   --   BILITOT 1.8*  --   --   --   --   --   --   --   --   --   --   < > = values in this interval not displayed. ------------------------------------------------------------------------------------------------------------------ estimated creatinine clearance is 69.7 mL/min (by C-G formula based on Cr of 1.15). ------------------------------------------------------------------------------------------------------------------  Recent Labs  06/13/15 2205  TSH 4.003    Cardiac Enzymes  Recent Labs Lab 06/14/15 0249 06/14/15 0800 06/14/15 1358  TROPONINI <0.03 <0.03 <0.03   ------------------------------------------------------------------------------------------------------------------ Invalid input(s): POCBNP ---------------------------------------------------------------------------------------------------------------  RADIOLOGY: Dg Chest Port 1 View  06/15/2015  CLINICAL DATA:  Right-sided PICC line placement. EXAM: PORTABLE CHEST 1 VIEW COMPARISON:  06/13/2015 FINDINGS: Interval placement of right-sided PICC line with tip overlying the distal SVC just above the cavoatrial junction. Lungs are adequately inflated with slight rounding hazy left infrahilar opacification. No evidence of effusion. Cardiomediastinal silhouette and remainder the exam is unchanged. IMPRESSION: Slight worsening hazy left infrahilar opacification which may be due to infection. Right-sided PICC line with tip over the distal SVC. Electronically Signed   By: Elberta Fortisaniel  Boyle M.D.   On: 06/15/2015 12:23    EKG:  Orders placed or performed during the hospital encounter of 06/13/15  . EKG 12-Lead  .  EKG 12-Lead  . ED EKG  . ED EKG    ASSESSMENT AND PLAN:  1. Acute respiratory distress due to COPD exacerbation as well as acute diastolic CHF, continue Pulmicort, DuoNeb's prednisone, add Lasix, follow clinically, follow ins and outs and weight. 2. COPD exacerbation,   on oxygen, azithromycin, Pulmicort, DuoNeb's, prednisone, appreciate pulmonologist input,  improved clinically 3. Lower extremity edema , likely right-sided heart failure due to advanced COPD, echocardiogram revealed normal ejection fraction, however, elevated central venous, and pulmonary artery pressures, Doppler ultrasound showed no DVT  appreciated cardiology help.  4. Hyponatremia, possible SIADH, appreciate nephrologist input. 3% saline , following sodium levels every 4 hours, now receiving Lasix, following sodium levels closely  5. Renal insufficiency, some improved with rehydration, follow with Lasix, unremarkable renal ultrasound, appreciated nephrology consult. 6. Acute cystitis,  urinary cultures not done, continue levofloxacin orally    Total 5 days ABx. 7. Elevated troponin, likely demand ischemia, echocardiogram showed normal ejection fraction, however, elevated central venous as well as pulmonary artery pressures, now initiated on diuretic 8. Hyperglycemia. hemoglobin A1c 6.2 9. Tobacco abuse, discussed this patient for 4 minutes yesterday. Nicotine replacement therapy is initiated 10. Lower back pain. Initiate patient on the Percocet, follow clinically 11. Acute diastolic CHF, likely due to fluid overload, initiate diuretics, follow clinically, continue oxygen therapy for now for comfort. Wean to room air as tolerated  Management plans discussed with the patient, family and they are in agreement.   DRUG ALLERGIES:  Allergies  Allergen Reactions  . Tramadol Other (See Comments)    Heart palpatations  . Penicillins Rash    CODE STATUS:     Code Status Orders        Start     Ordered   06/14/15  0202  Full code   Continuous     06/14/15 0201    Advance Directive Documentation        Most Recent Value   Type of Advance Directive  Living will   Pre-existing out of facility DNR order (yellow form or pink MOST form)     "MOST" Form in Place?        TOTAL critical care TIME TAKING CARE OF THIS PATIENT: 30 minutes.  Discussed with intensivist, Dr. Lennox Grumbles, Heath Gold M.D on 06/16/2015 at 9:32 PM  Between 7am to 6pm - Pager - [phone]  After 6pm go to [email or web address] - password [email or web address] [facility]  [facility]  Office  [phone]  CC: Primary care physician; [facility]

## 2015-06-17 ENCOUNTER — Telehealth: Payer: Self-pay | Admitting: *Deleted

## 2015-06-17 DIAGNOSIS — B370 Candidal stomatitis: Secondary | ICD-10-CM

## 2015-06-17 LAB — BASIC METABOLIC PANEL
ANION GAP: 10 (ref 5–15)
ANION GAP: 9 (ref 5–15)
BUN: 19 mg/dL (ref 6–20)
BUN: 19 mg/dL (ref 6–20)
CHLORIDE: 102 mmol/L (ref 101–111)
CO2: 16 mmol/L — ABNORMAL LOW (ref 22–32)
CO2: 14 mmol/L — ABNORMAL LOW (ref 22–32)
Calcium: 7.9 mg/dL — ABNORMAL LOW (ref 8.9–10.3)
Calcium: 7.3 mg/dL — ABNORMAL LOW (ref 8.9–10.3)
Chloride: 122 mmol/L — ABNORMAL HIGH (ref 101–111)
Creatinine, Ser: 1.17 mg/dL — ABNORMAL HIGH (ref 0.44–1.00)
Creatinine, Ser: 1.06 mg/dL — ABNORMAL HIGH (ref 0.44–1.00)
GFR, EST NON AFRICAN AMERICAN: 53 mL/min — AB (ref >60)
Glucose, Bld: 126 mg/dL — ABNORMAL HIGH (ref 65–99)
Glucose, Bld: 124 mg/dL — ABNORMAL HIGH (ref 65–99)
POTASSIUM: 4.2 mmol/L (ref 3.5–5.1)
POTASSIUM: 4.1 mmol/L (ref 3.5–5.1)
SODIUM: 128 mmol/L — AB (ref 135–145)
SODIUM: 145 mmol/L (ref 135–145)

## 2015-06-17 LAB — SODIUM
SODIUM: 127 mmol/L — AB (ref 135–145)
SODIUM: 128 mmol/L — AB (ref 135–145)
Sodium: 127 mmol/L — ABNORMAL LOW (ref 135–145)
Sodium: 130 mmol/L — ABNORMAL LOW (ref 135–145)

## 2015-06-17 LAB — GLUCOSE, CAPILLARY
GLUCOSE-CAPILLARY: 138 mg/dL — AB (ref 65–99)
GLUCOSE-CAPILLARY: 131 mg/dL — AB (ref 65–99)
Glucose-Capillary: 127 mg/dL — ABNORMAL HIGH (ref 65–99)
Glucose-Capillary: 113 mg/dL — ABNORMAL HIGH (ref 65–99)

## 2015-06-17 MED ORDER — PNEUMOCOCCAL VAC POLYVALENT 25 MCG/0.5ML IJ INJ
0.5000 mL | INJECTION | INTRAMUSCULAR | Status: AC
  Administered 2015-06-18: 0.5 mL via INTRAMUSCULAR
  Filled 2015-06-17: qty 0.5

## 2015-06-17 MED ORDER — LORAZEPAM 2 MG/ML IJ SOLN
0.5000 mg | INTRAMUSCULAR | Status: DC | PRN
  Administered 2015-06-17: 0.5 mg via INTRAVENOUS
  Filled 2015-06-17: qty 1

## 2015-06-17 MED ORDER — MOMETASONE FURO-FORMOTEROL FUM 200-5 MCG/ACT IN AERO
2.0000 | INHALATION_SPRAY | Freq: Two times a day (BID) | RESPIRATORY_TRACT | Status: DC
  Filled 2015-06-17: qty 8.8

## 2015-06-17 MED ORDER — ARFORMOTEROL TARTRATE 15 MCG/2ML IN NEBU
15.0000 ug | INHALATION_SOLUTION | Freq: Two times a day (BID) | RESPIRATORY_TRACT | Status: DC
  Administered 2015-06-17 – 2015-06-23 (×11): 15 ug via RESPIRATORY_TRACT
  Filled 2015-06-17 (×24): qty 2

## 2015-06-17 NOTE — Progress Notes (Signed)
Patient: Erica [name] / Admit Date: 06/13/2015 / Date of Encounter: 06/17/2015, 10:44 AM   Subjective: Improved SOB. Very anxious over the past 24 hours. Sodium has improved to 145. Repeat is pending.   Review of Systems: Review of Systems  Constitutional: Positive for malaise/fatigue. Negative for fever, chills, weight loss and diaphoresis.  HENT: Negative for congestion.   Eyes: Negative for discharge and redness.  Respiratory: Positive for shortness of breath. Negative for cough, hemoptysis, sputum production and wheezing.   Cardiovascular: Negative for chest pain, palpitations, orthopnea, claudication, leg swelling and PND.  Gastrointestinal: Negative for nausea and vomiting.  Musculoskeletal: Negative for falls.  Skin: Negative for rash.  Neurological: Positive for weakness. Negative for sensory change, speech change, focal weakness and loss of consciousness.  Endo/Heme/Allergies: Does not bruise/bleed easily.  Psychiatric/Behavioral: The patient is nervous/anxious.     Objective: Telemetry: NSR, 80's Physical Exam: Blood pressure 96/42, pulse 95, temperature 98.2 F (36.8 C), temperature source Oral, resp. rate 18, height  (1.727 m), weight 211 lb 3.9 oz (95.82 kg), last menstrual period 03/13/2015, SpO2 96 %. Body mass index is 32.13 kg/(m^2). General: Well developed, well nourished, in no acute distress. Head: Normocephalic, atraumatic, sclera non-icteric, no xanthomas, nares are without discharge. Neck: Negative for carotid bruits. JVP not elevated. Lungs: Faint crackles bilateral bases. Breathing is unlabored. Heart: RRR S1 S2 without murmurs, rubs, or gallops.  Abdomen: Soft, non-tender, non-distended with normoactive bowel sounds. No rebound/guarding. Extremities: No clubbing or cyanosis. No edema. Distal pedal pulses are 2+ and equal bilaterally. Neuro: Alert and oriented X 3. Moves all extremities spontaneously. Psych:  Responds to questions appropriately  with a normal affect.   Intake/Output Summary (Last 24 hours) at 06/17/15 1044 Last data filed at 06/17/15 0800  Gross per 24 hour  Intake 668.67 ml  Output    300 ml  Net 368.67 ml    Inpatient Medications:  . aspirin EC  81 mg Oral Daily  . atorvastatin  20 mg Oral q1800  . enoxaparin (LOVENOX) injection  40 mg Subcutaneous Q24H  . insulin aspart  0-9 Units Subcutaneous TID AC & HS  . levofloxacin  500 mg Oral QHS  . lidocaine  1 patch Transdermal Q24H  . magic mouthwash  10 mL Oral TID  . nicotine  21 mg Transdermal Daily  . nitroGLYCERIN  0.5 inch Topical 4 times per day  . [START ON 06/18/2015] pneumococcal 23 valent vaccine  0.5 mL Intramuscular Tomorrow-1000  . potassium chloride  20 mEq Oral Daily   Infusions:  . sodium chloride (hypertonic) 50 mL/hr (06/17/15 1041)    Labs:  Recent Labs  06/17/15 0450 06/17/15 0800  NA 128* 145  K 4.2 4.1  CL 102 122*  CO2 16* 14*  GLUCOSE 126* 124*  BUN 19 19  CREATININE 1.17* 1.06*  CALCIUM 7.9* 7.3*    Recent Labs  06/14/15 2042  AST 51*  ALT 15  ALKPHOS 68  BILITOT 1.8*  PROT 6.6  ALBUMIN 2.9*    Recent Labs  06/15/15 0520 06/16/15 0655  WBC 12.2* 11.5*  HGB 10.1* 9.0*  HCT 29.6* 26.9*  MCV 111.6* 112.7*  PLT 260 244    Recent Labs  06/14/15 1358  TROPONINI <0.03   Invalid input(s): POCBNP  Recent Labs  06/14/15 1358  HGBA1C 6.2*     Weights: Filed Weights   06/16/15 0459 06/17/15 0413 06/17/15 0500  Weight: 204 lb 6.4 oz (92.715 kg) 211 lb 3.2 oz (  95.8 kg) 211 lb 3.9 oz (95.82 kg)     Radiology/Studies:  Dg Chest 2 View  06/13/2015  CLINICAL DATA:  Shortness of breath. Asthma. COPD. Swelling of the feet and legs. EXAM: CHEST  2 VIEW COMPARISON:  03/23/2013 FINDINGS: Indistinct pulmonary vasculature, with slight interstitial accentuation in the lung bases. Is is heart size within normal limits. No pleural effusion. Mild bilateral airway thickening. IMPRESSION: 1. Airway thickening  is present, suggesting bronchitis or reactive airways disease. There is faint associated interstitial accentuation in the lung bases. No overt airspace opacity. Electronically Signed   By: Gaylyn RongWalter  Liebkemann M.D.   On: 06/13/2015 22:34   Ct Angio Chest Pe W/cm &/or Wo Cm  06/14/2015  CLINICAL DATA:  Short of breath and chest tightness. Chest pain for several weeks with productive cough. Concern pulmonary embolism. Smoking history. EXAM: CT ANGIOGRAPHY CHEST WITH CONTRAST TECHNIQUE: Multidetector CT imaging of the chest was performed using the standard protocol during bolus administration of intravenous contrast. Multiplanar CT image reconstructions and MIPs were obtained to evaluate the vascular anatomy. CONTRAST:  75mL OMNIPAQUE IOHEXOL 350 MG/ML SOLN COMPARISON:  CT 03/23/2013 FINDINGS: Mediastinum/Nodes: No filling defects within the pulmonary suggest acute pulmonary embolism. No acute findings aorta great vessels. Mildly enlarged paratracheal lymph nodes up to 12 mm. Subcarinal lymph node similar 13 mm. There is mild bilateral hilar lymphadenopathy. No pericardial fluid.  Esophagus is normal. Lungs/Pleura: Small nodule in the RIGHT upper lobe measures 5 mm on image 62, series 6. nodule appears slightly increased from 3 mm on prior. 3 mm RIGHT upper lobe nodule on image 67, series 6 is increased from 2 mm on prior. There is peripheral ground-glass opacity in the LEFT upper lobe (image 43 through 59). Upper abdomen: Limited view of the liver, kidneys, pancreas are unremarkable. Normal adrenal glands. Musculoskeletal: No acute osseous abnormality. Review of the MIP images confirms the above findings. IMPRESSION: 1. No evidence acute pulmonary embolism. 2. Ground-glass opacities in the periphery of the LEFT upper lobe suggest mild pulmonary infection. 3. Mild mediastinal and hilar lymphadenopathy is likely reactive. 4. Interval enlargement of 2 small RIGHT upper lobe pulmonary nodules. 5. With the findings of  infection, adenopathy, and potentially enlarging nodules coupled with smoking history, recommend follow-up CT without contrast in 3 months to re-evaluate. Electronically Signed   By: Genevive BiStewart  Edmunds M.D.   On: 06/14/2015 11:39   [place] Renal  06/14/2015  CLINICAL DATA:  Renal insufficiency EXAM: RENAL / URINARY TRACT ULTRASOUND COMPLETE COMPARISON:  None. FINDINGS: Right Kidney: Length: 11.8 cm. Echogenicity within normal limits. No mass or hydronephrosis visualized. Left Kidney: Length: 12.2 cm. Echogenicity within normal limits. No mass or hydronephrosis visualized. Bladder: Appears normal for degree of bladder distention. IMPRESSION: No hydronephrosis or diagnostic renal calculus. Suboptimal exam due to patient's large body habitus. Electronically Signed   By: Natasha MeadLiviu  Pop M.D.   On: 06/14/2015 14:26   [place] Venous Img Lower Bilateral  06/14/2015  CLINICAL DATA:  Bilateral leg swelling EXAM: BILATERAL LOWER EXTREMITY VENOUS DOPPLER ULTRASOUND TECHNIQUE: Gray-scale sonography with graded compression, as well as color Doppler and duplex ultrasound were performed to evaluate the lower extremity deep venous systems from the level of the common femoral vein and including the common femoral, femoral, profunda femoral, popliteal and calf veins including the posterior tibial, peroneal and gastrocnemius veins when visible. The superficial great saphenous vein was also interrogated. Spectral Doppler was utilized to evaluate flow at rest and with distal augmentation maneuvers in the common femoral, femoral  and popliteal veins. COMPARISON:  None. FINDINGS: RIGHT LOWER EXTREMITY Common Femoral Vein: No evidence of thrombus. Normal compressibility, respiratory phasicity and response to augmentation. Saphenofemoral Junction: No evidence of thrombus. Normal compressibility and flow on color Doppler imaging. Profunda Femoral Vein: No evidence of thrombus. Normal compressibility and flow on color Doppler imaging. Femoral Vein: No  evidence of thrombus. Normal compressibility, respiratory phasicity and response to augmentation. Popliteal Vein: No evidence of thrombus. Normal compressibility, respiratory phasicity and response to augmentation. Calf Veins: No evidence of thrombus. Normal compressibility and flow on color Doppler imaging. Superficial Great Saphenous Vein: No evidence of thrombus. Normal compressibility and flow on color Doppler imaging. Venous Reflux:  None. Other Findings:  None. LEFT LOWER EXTREMITY Common Femoral Vein: No evidence of thrombus. Normal compressibility, respiratory phasicity and response to augmentation. Saphenofemoral Junction: No evidence of thrombus. Normal compressibility and flow on color Doppler imaging. Profunda Femoral Vein: No evidence of thrombus. Normal compressibility and flow on color Doppler imaging. Femoral Vein: No evidence of thrombus. Normal compressibility, respiratory phasicity and response to augmentation. Popliteal Vein: No evidence of thrombus. Normal compressibility, respiratory phasicity and response to augmentation. Calf Veins: No evidence of thrombus. Normal compressibility and flow on color Doppler imaging. Superficial Great Saphenous Vein: No evidence of thrombus. Normal compressibility and flow on color Doppler imaging. Venous Reflux:  None. Other Findings: There is a left inguinal lymph node measures 2.8 x 1.9 cm. IMPRESSION: No evidence of deep venous thrombosis bilateral lower extremity. Left inguinal lymph node measures 2.8 x 1.9 cm. Electronically Signed   By: Natasha Mead M.D.   On: 06/14/2015 14:31   Dg Chest Port 1 View  06/15/2015  CLINICAL DATA:  Right-sided PICC line placement. EXAM: PORTABLE CHEST 1 VIEW COMPARISON:  06/13/2015 FINDINGS: Interval placement of right-sided PICC line with tip overlying the distal SVC just above the cavoatrial junction. Lungs are adequately inflated with slight rounding hazy left infrahilar opacification. No evidence of effusion.  Cardiomediastinal silhouette and remainder the exam is unchanged. IMPRESSION: Slight worsening hazy left infrahilar opacification which may be due to infection. Right-sided PICC line with tip over the distal SVC. Electronically Signed   By: Elberta Fortis M.D.   On: 06/15/2015 12:23     Assessment and Plan   1. Respiratory distress/shortness of breath: -Improving -Suspect upper respiratory infection, unable to exclude acute on chronic diastolic CHF, pulmonary hypertension -Moderately elevated right heart pressures seen on echocardiogram, RVSP 56 -She has been on IV fluids for low sodium, will be continued per Renal given persisting hyponatremia  -If shortness of breath and cough get worse, will likely need Lasix, though soft BP preclude usage at this time -Currently on antibiotic, COPD regimen, followed by pulmonary -Once her symptoms have improved, could consider Myoview to rule out ischemia -High risk of underlying coronary artery disease given long history of smoking -Patient has ruled out with troponin negative x 3, there is no indication for continuing Plavix in this patient in this setting, will discontinue   2. Acute on chronic diastolic CHF: -On nitro paste leading to soft BP -May benefit from discontinuation of nitro paste, following BP, could start gentle diuresis  -Echo showing Elevated right heart pressures likely secondary to underlying lung disease, diastolic dysfunction  3. COPD exacerbation: -Improving -Managed by pulmonary -Antibiotic, nebulizer  4. Hyponatremia: -Up to 145 this AM, repeat is pending to verify -Etiology unclear, she does admit to poor diet given her thrush -Not on any medications that should have contributed to low  sodium  -Started on hypertonic saline with improvement of sodium -Could use tolvaptan as above if the 145 is not verified  -Renal following  5. Leg edema: -Improved -Patient reports having leg swelling for several months -This may be  consistent with elevated right heart pressures -May need Lasix for symptom relief, shortness of breath, cough consistent with elevated right heart pressures  6. Thrush: -Long history of thrush over the past several months, previously treated with swish and swallow as well as nystatin got better but then came back -May have started with steroid inhaler,QVAR -Will defer management to medical team  7. Chronic back pain: -Per IM/PCP  8. Anxiety: -Per IM   Signed, Eula Listen, PA-C Pager: [phone] 06/17/2015, 10:44 AM

## 2015-06-17 NOTE — Progress Notes (Signed)
[facility]  ROUNDING NOTE   Subjective:  Na up to 128.   Remains on 3% saline.  Overall feeling better.   Objective:  Vital signs in last 24 hours:  Temp:  [97.6 F (36.4 C)-98.2 F (36.8 C)] 98.2 F (36.8 C) (11/29 0716) Pulse Rate:  [90-98] 95 (11/29 0716) Resp:  [18-22] 18 (11/29 0716) BP: (91-98)/(42-57) 96/42 mmHg (11/29 0716) SpO2:  [94 %-99 %] 96 % (11/29 0716) Weight:  [95.8 kg (211 lb 3.2 oz)-95.82 kg (211 lb 3.9 oz)] 95.82 kg (211 lb 3.9 oz) (11/29 0500)  Weight change: 3.085 kg (6 lb 12.8 oz) Filed Weights   06/16/15 0459 06/17/15 0413 06/17/15 0500  Weight: 92.715 kg (204 lb 6.4 oz) 95.8 kg (211 lb 3.2 oz) 95.82 kg (211 lb 3.9 oz)    Intake/Output: I/O last 3 completed shifts: In: 2080.5 [P.O.:942; I.V.:1138.5] Out: 400 [Urine:400]   Intake/Output this shift:     Physical Exam: General: NAD, resting in bed   Head: Normocephalic, atraumatic. Moist oral mucosal membranes  Eyes: Anicteric  Neck: Supple, trachea midline  Lungs:  Clear to auscultation  Heart: Regular rate and rhythm  Abdomen:  Soft, nontender, bowel sounds present   Extremities:  trace peripheral edema.  Neurologic: Nonfocal, moving all four extremities  Skin: No lesions       Basic Metabolic Panel:  Recent Labs Lab 06/15/15 0520  06/15/15 1827  06/16/15 0655 06/16/15 1027 06/16/15 1352 06/16/15 1909 06/17/15 06/17/15 0450  NA 121*  < > 120*  < > 126* 125* 124* 126* 127* 128*  K 3.7  --  4.9  --  4.6  --   --  5.1  --  4.2  CL 90*  --  93*  --  98*  --   --  101  --  102  CO2 20*  --  18*  --  19*  --   --  17*  --  16*  GLUCOSE 112*  --  192*  --  124*  --   --  135*  --  126*  BUN 20  --  20  --  19  --   --  19  --  19  CREATININE 0.96  --  1.17*  --  1.12*  --   --  1.15*  --  1.17*  CALCIUM 8.1*  --  7.9*  --  7.9*  --   --  8.0*  --  7.9*  < > = values in this interval not displayed.  Liver Function Tests:  Recent Labs Lab 06/14/15 2042  AST 51*   ALT 15  ALKPHOS 68  BILITOT 1.8*  PROT 6.6  ALBUMIN 2.9*   No results for input(s): LIPASE, AMYLASE in the last 168 hours. No results for input(s): AMMONIA in the last 168 hours.  CBC:  Recent Labs Lab 06/13/15 2205 06/14/15 0249 06/15/15 0520 06/16/15 0655  WBC 14.8* 11.9* 12.2* 11.5*  HGB 11.5* 10.5* 10.1* 9.0*  HCT 32.7* 29.7* 29.6* 26.9*  MCV 111.6* 110.4* 111.6* 112.7*  PLT 304 260 260 244    Cardiac Enzymes:  Recent Labs Lab 06/13/15 2205 06/14/15 0249 06/14/15 0800 06/14/15 1358  TROPONINI 0.06* <0.03 <0.03 <0.03    BNP: Invalid input(s): POCBNP  CBG:  Recent Labs Lab 06/16/15 0722 06/16/15 1129 06/16/15 1615 06/16/15 2143 06/17/15 0714  GLUCAP 138* 203* 179* 119* 127*    Microbiology: Results for orders placed or performed during the hospital encounter of 06/13/15  MRSA PCR Screening     Status: None   Collection Time: 06/14/15  2:01 AM  Result Value Ref Range Status   MRSA by PCR NEGATIVE NEGATIVE Final    Comment:        The GeneXpert MRSA Assay (FDA approved for NASAL specimens only), is one component of a comprehensive MRSA colonization surveillance program. It is not intended to diagnose MRSA infection nor to guide or monitor treatment for MRSA infections.   Culture, blood (routine x 2)     Status: None (Preliminary result)   Collection Time: 06/14/15  8:56 AM  Result Value Ref Range Status   Specimen Description BLOOD  BLD RIGHT ARM  Final   Special Requests   Final    BOTTLES DRAWN AEROBIC AND ANAEROBIC  7CC AERO, 4CC ANAERO   Culture NO GROWTH 2 DAYS  Final   Report Status PENDING  Incomplete  Culture, blood (routine x 2)     Status: None (Preliminary result)   Collection Time: 06/14/15  9:04 AM  Result Value Ref Range Status   Specimen Description BLOOD RIGHT ASSIST CONTROL  Final   Special Requests   Final    BOTTLES DRAWN AEROBIC AND ANAEROBIC  4CC AERO, 8CC ANAERO   Culture NO GROWTH 2 DAYS  Final   Report Status  PENDING  Incomplete    Coagulation Studies: No results for input(s): LABPROT, INR in the last 72 hours.  Urinalysis: No results for input(s): COLORURINE, LABSPEC, PHURINE, GLUCOSEU, HGBUR, BILIRUBINUR, KETONESUR, PROTEINUR, UROBILINOGEN, NITRITE, LEUKOCYTESUR in the last 72 hours.  Invalid input(s): APPERANCEUR    Imaging: Dg Chest Port 1 View  06/15/2015  CLINICAL DATA:  Right-sided PICC line placement. EXAM: PORTABLE CHEST 1 VIEW COMPARISON:  06/13/2015 FINDINGS: Interval placement of right-sided PICC line with tip overlying the distal SVC just above the cavoatrial junction. Lungs are adequately inflated with slight rounding hazy left infrahilar opacification. No evidence of effusion. Cardiomediastinal silhouette and remainder the exam is unchanged. IMPRESSION: Slight worsening hazy left infrahilar opacification which may be due to infection. Right-sided PICC line with tip over the distal SVC. Electronically Signed   By: Elberta Fortis M.D.   On: 06/15/2015 12:23     Medications:   . sodium chloride (hypertonic) 50 mL/hr (06/16/15 2348)   . aspirin EC  81 mg Oral Daily  . atorvastatin  20 mg Oral q1800  . budesonide  2 mL Inhalation BID  . enoxaparin (LOVENOX) injection  40 mg Subcutaneous Q24H  . Influenza vac split quadrivalent PF  0.5 mL Intramuscular Tomorrow-1000  . insulin aspart  0-9 Units Subcutaneous TID AC & HS  . levofloxacin  500 mg Oral QHS  . lidocaine  1 patch Transdermal Q24H  . magic mouthwash  10 mL Oral TID  . nicotine  21 mg Transdermal Daily  . nitroGLYCERIN  0.5 inch Topical 4 times per day  . potassium chloride  20 mEq Oral Daily   acetaminophen, albuterol, ipratropium-albuterol, nicotine polacrilex, nitroGLYCERIN, ondansetron (ZOFRAN) IV, oxyCODONE-acetaminophen  Assessment/ Plan:  49 y.o. female with a PMHx of COPD, gastric ulcer, osteoarthritis, tobacco abuse, who was admitted to [facility] on 06/13/2015 for evaluation of shortness of breath and  cough.  1. Hyponatremia.  Urine Na was very low at < 10.   2. Oral thrush.  Improved 3. Urinary tract infection.  Plan:  Patient appears to be doing well. Serum sodium up to 128. Continue 3% saline at 50 cc per hour. Continue periodic serum sodium checks. Patient  will also continue on levofloxacin as she was found to have hematuria and pyuria. Overall making slow but steady progress.   LOS: 3 Shonice Wrisley 11/29/20167:27 AM

## 2015-06-17 NOTE — Progress Notes (Signed)
Feeling better Still dyspneic with minimal exertion, but improving No fever, cough, hemoptysis  Filed Vitals:   06/17/15 0413 06/17/15 0500 06/17/15 0716 06/17/15 1118  BP: 91/57  96/42 96/49  Pulse: 98  95 93  Temp: 97.9 F (36.6 C)  98.2 F (36.8 C) 97.5 F (36.4 C)  TempSrc: Oral  Oral Oral  Resp: 18  18 18   Height:      Weight: 211 lb 3.2 oz (95.8 kg) 211 lb 3.9 oz (95.82 kg)    SpO2: 94%  96% 96%   Appears chronically ill No overt distress No JVD noted Bibasilar crackles, no wheezes RRR s M NABS Trace symmetric LE edema  BMP Latest Ref Rng 06/17/2015 06/17/2015 06/17/2015  Glucose 65 - 99 mg/dL - 161(W124(H) 960(A126(H)  BUN 6 - 20 mg/dL - 19 19  Creatinine 5.400.44 - 1.00 mg/dL - 9.81(X1.06(H) 9.14(N1.17(H)  Sodium 135 - 145 mmol/L 127(L) 145 128(L)  Potassium 3.5 - 5.1 mmol/L - 4.1 4.2  Chloride 101 - 111 mmol/L - 122(H) 102  CO2 22 - 32 mmol/L - 14(L) 16(L)  Calcium 8.9 - 10.3 mg/dL - 7.3(L) 7.9(L)    CBC Latest Ref Rng 06/16/2015 06/15/2015 06/14/2015  WBC 3.6 - 11.0 K/uL 11.5(H) 12.2(H) 11.9(H)  Hemoglobin 12.0 - 16.0 g/dL 9.0(L) 10.1(L) 10.5(L)  Hematocrit 35.0 - 47.0 % 26.9(L) 29.6(L) 29.7(L)  Platelets 150 - 440 K/uL 244 260 260    No new CXR - prior films reviewed  CT chest 11/26  IMPRESSION: 1. No evidence acute pulmonary embolism. 2. Ground-glass opacities in the periphery of the LEFT upper lobe suggest mild pulmonary infection. 3. Mild mediastinal and hilar lymphadenopathy is likely reactive. 4. Interval enlargement of 2 small RIGHT upper lobe pulmonary nodules. 5. With the findings of infection, adenopathy, and potentially enlarging nodules coupled with smoking history, recommend follow-up CT without contrast in 3 months to re-evaluate.  IMPRESSION: Acute respiratory failure Heavy smoker Patchy bilateral infiltrates - inflammatory vs edema Pulmonary nodules - indeterminant in appearance Hyponatremia - worrisome in setting of pulmonary nodules  PLAN/REC: Cont  current Rx - on pulmicort and started brovana 11/29 Counseled on smoking cessation Diuresis to extent permitted by BP and renal function Recheck CXR at time of discharge She should have F/U with Pulm Medicine after discharge Will need repeat CT chest in 3 months to follow lung nodules Patient with chronic thrush, may not able to use COPD inhalers, might need nebulizers as part of COPD treatment upon discharge.  Pulmonary consult time - 35 mins  Stephanie AcreVishal Jerrica Thorman, MD [facility] Pager [phone] (please enter 7-digits) On Call Pager - [phone] (please enter 7-digits)

## 2015-06-17 NOTE — Telephone Encounter (Signed)
appt scheduled for 07-04-15 @ 9:30am with DS. Spoke with Joni ReiningNicole on the floor and gave appt date and time to place on D/C paperwork. Nothing further needed.

## 2015-06-17 NOTE — Progress Notes (Addendum)
MEDICATION RELATED CONSULT NOTE - Follow Up   Pharmacy Consult for hypertonic saline monitoring Indication: hyponatremia   Patient Measurements: Height: 5\' 8"  (172.7 cm) Weight: 204 lb 6.4 oz (92.715 kg) IBW/kg (Calculated) : 63.9  Vital Signs: Temp: 97.6 F (36.4 C) (11/29 0142) Temp Source: Oral (11/29 0142) BP: 98/46 mmHg (11/29 0142) Pulse Rate: 94 (11/29 0142)  Labs:  Recent Labs  06/14/15 2042 06/15/15 0520 06/15/15 1827 06/16/15 0655 06/16/15 1909  WBC  --  12.2*  --  11.5*  --   HGB  --  10.1*  --  9.0*  --   HCT  --  29.6*  --  26.9*  --   PLT  --  260  --  244  --   CREATININE 1.06* 0.96 1.17* 1.12* 1.15*  ALBUMIN 2.9*  --   --   --   --   PROT 6.6  --   --   --   --   AST 51*  --   --   --   --   ALT 15  --   --   --   --   ALKPHOS 68  --   --   --   --   BILITOT 1.8*  --   --   --   --    Estimated Creatinine Clearance: 69.7 mL/min (by C-G formula based on Cr of 1.15).   Medical History: Past Medical History  Diagnosis Date  . Arthritis   . COPD (chronic obstructive pulmonary disease) (HCC)   . Gastric ulcer   . OSA on CPAP no ins - no cpap machine for the last 4 years  . Asthma   . Hypertension     Medications:  Infusions:  . sodium chloride (hypertonic) 50 mL/hr (06/16/15 2348)    Assessment: 49 yof cc SOB being worked up for SOB/chest pain presents with serum sodium 112 mEq/L. Starting hypertonic saline.  11/28 0700 Na level: 126 11/28 1027 Na level: 125 11/28 1352 Na level: 124 11/28 1900 Na level 126 11/29 0000 Na level 127 11/29 0450 Na level 128  Goal of Therapy:  1. Increase serum sodium 4 to 6 mEq/L as soon as possible 2. Do not exceed increase of 8 mEq/L per day  Plan:  Last serum sodium of 127.  Hypertonic saline rate 50 mL/hr.  Continue current rate.  Serum sodiums ordered q4h.  Next sodium is for 0400 today.  Nephrology consulted.   Pharmacy will continue to follow.  Fulton ReekMatt Tiberius Loftus, PharmD Clinical  Pharmacist 06/17/2015

## 2015-06-17 NOTE — Progress Notes (Addendum)
MEDICATION RELATED CONSULT NOTE - Follow Up   Pharmacy Consult for hypertonic saline monitoring Indication: hyponatremia   Patient Measurements: Height: 5\' 8"  (172.7 cm) Weight: 211 lb 3.9 oz (95.82 kg) IBW/kg (Calculated) : 63.9  Vital Signs: Temp: 98.6 F (37 C) (11/29 2015) Temp Source: Oral (11/29 2015) BP: 106/47 mmHg (11/29 2015) Pulse Rate: 89 (11/29 2015)  Labs:  Recent Labs  06/15/15 0520  06/16/15 0655 06/16/15 1909 06/17/15 0450 06/17/15 0800  WBC 12.2*  --  11.5*  --   --   --   HGB 10.1*  --  9.0*  --   --   --   HCT 29.6*  --  26.9*  --   --   --   PLT 260  --  244  --   --   --   CREATININE 0.96  < > 1.12* 1.15* 1.17* 1.06*  < > = values in this interval not displayed. Estimated Creatinine Clearance: 76.9 mL/min (by C-G formula based on Cr of 1.06).   Medical History: Past Medical History  Diagnosis Date  . Arthritis   . COPD (chronic obstructive pulmonary disease) (HCC)   . Gastric ulcer   . OSA on CPAP no ins - no cpap machine for the last 4 years  . Asthma   . Hypertension     Medications:  Infusions:  . sodium chloride (hypertonic) 50 mL/hr (06/17/15 1041)    Assessment: 49 yof cc SOB being worked up for SOB/chest pain presents with serum sodium 112 mEq/L. Starting hypertonic saline.  11/28 0700 Na level: 126 11/28 1027 Na level: 125 11/28 1352 Na level: 124 11/28 1900 Na level 126 11/29 0000 Na level 127 11/29 0450 Na level 128 11/29 0800 Na level 145 11/29 0949 (stat redraw) Na level of 127 11/29 1610 Na level 128 11/30 0140 Na level 130 11/30 0550 Na level 134  Goal of Therapy:  1. Increase serum sodium 4 to 6 mEq/L as soon as possible 2. Do not exceed increase of 8 mEq/L per day  Plan:  Sodium level of 130 @ 1815   Hypertonic saline infusing at rate of 50 mL/hr.  Continue current rate.  Serum sodiums ordered q4h.  Next sodium is for 12mn 11/30.  Nephrology consulted.   Pharmacy will continue to follow.  Clovia CuffLisa Kluttz,  PharmD, BCPS 06/17/2015 9:19 PM

## 2015-06-17 NOTE — Progress Notes (Signed)
MEDICATION RELATED CONSULT NOTE - Follow Up   Pharmacy Consult for hypertonic saline monitoring Indication: hyponatremia   Patient Measurements: Height: 5\' 8"  (172.7 cm) Weight: 211 lb 3.9 oz (95.82 kg) IBW/kg (Calculated) : 63.9  Vital Signs: Temp: 98.3 F (36.8 C) (11/29 1442) Temp Source: Oral (11/29 1442) BP: 95/42 mmHg (11/29 1442) Pulse Rate: 90 (11/29 1442)  Labs:  Recent Labs  06/14/15 2042 06/15/15 0520  06/16/15 0655 06/16/15 1909 06/17/15 0450 06/17/15 0800  WBC  --  12.2*  --  11.5*  --   --   --   HGB  --  10.1*  --  9.0*  --   --   --   HCT  --  29.6*  --  26.9*  --   --   --   PLT  --  260  --  244  --   --   --   CREATININE 1.06* 0.96  < > 1.12* 1.15* 1.17* 1.06*  ALBUMIN 2.9*  --   --   --   --   --   --   PROT 6.6  --   --   --   --   --   --   AST 51*  --   --   --   --   --   --   ALT 15  --   --   --   --   --   --   ALKPHOS 68  --   --   --   --   --   --   BILITOT 1.8*  --   --   --   --   --   --   < > = values in this interval not displayed. Estimated Creatinine Clearance: 76.9 mL/min (by C-G formula based on Cr of 1.06).   Medical History: Past Medical History  Diagnosis Date  . Arthritis   . COPD (chronic obstructive pulmonary disease) (HCC)   . Gastric ulcer   . OSA on CPAP no ins - no cpap machine for the last 4 years  . Asthma   . Hypertension     Medications:  Infusions:  . sodium chloride (hypertonic) 50 mL/hr (06/17/15 1041)    Assessment: 49 yof cc SOB being worked up for SOB/chest pain presents with serum sodium 112 mEq/L. Starting hypertonic saline.  11/28 0700 Na level: 126 11/28 1027 Na level: 125 11/28 1352 Na level: 124 11/28 1900 Na level 126 11/29 0000 Na level 127 11/29 0450 Na level 128 11/29 0800 Na level 145 11/29 0949 (stat redraw) Na level of 127  Goal of Therapy:  1. Increase serum sodium 4 to 6 mEq/L as soon as possible 2. Do not exceed increase of 8 mEq/L per day  Plan:  Sodium level of 145  at 0800 was a lab error.  Redrawn level of 127.   Hypertonic saline infusing at rate of 50 mL/hr.  Continue current rate.  Serum sodiums ordered q4h.  Next sodium is for 1300 today.  Nephrology consulted.   Pharmacy will continue to follow.  Fulton ReekMatt McBane, PharmD Clinical Pharmacist 06/17/2015

## 2015-06-17 NOTE — Progress Notes (Signed)
MEDICATION RELATED CONSULT NOTE - Follow Up   Pharmacy Consult for hypertonic saline monitoring Indication: hyponatremia   Patient Measurements: Height: 5\' 8"  (172.7 cm) Weight: 211 lb 3.9 oz (95.82 kg) IBW/kg (Calculated) : 63.9  Vital Signs: Temp: 98.3 F (36.8 C) (11/29 1442) Temp Source: Oral (11/29 1442) BP: 95/42 mmHg (11/29 1442) Pulse Rate: 90 (11/29 1442)  Labs:  Recent Labs  06/14/15 2042 06/15/15 0520  06/16/15 0655 06/16/15 1909 06/17/15 0450 06/17/15 0800  WBC  --  12.2*  --  11.5*  --   --   --   HGB  --  10.1*  --  9.0*  --   --   --   HCT  --  29.6*  --  26.9*  --   --   --   PLT  --  260  --  244  --   --   --   CREATININE 1.06* 0.96  < > 1.12* 1.15* 1.17* 1.06*  ALBUMIN 2.9*  --   --   --   --   --   --   PROT 6.6  --   --   --   --   --   --   AST 51*  --   --   --   --   --   --   ALT 15  --   --   --   --   --   --   ALKPHOS 68  --   --   --   --   --   --   BILITOT 1.8*  --   --   --   --   --   --   < > = values in this interval not displayed. Estimated Creatinine Clearance: 76.9 mL/min (by C-G formula based on Cr of 1.06).   Medical History: Past Medical History  Diagnosis Date  . Arthritis   . COPD (chronic obstructive pulmonary disease) (HCC)   . Gastric ulcer   . OSA on CPAP no ins - no cpap machine for the last 4 years  . Asthma   . Hypertension     Medications:  Infusions:  . sodium chloride (hypertonic) 50 mL/hr (06/17/15 1041)    Assessment: 49 yof cc SOB being worked up for SOB/chest pain presents with serum sodium 112 mEq/L. Starting hypertonic saline.  11/28 0700 Na level: 126 11/28 1027 Na level: 125 11/28 1352 Na level: 124 11/28 1900 Na level 126 11/29 0000 Na level 127 11/29 0450 Na level 128 11/29 0800 Na level 145 11/29 0949 (stat redraw) Na level of 127  Goal of Therapy:  1. Increase serum sodium 4 to 6 mEq/L as soon as possible 2. Do not exceed increase of 8 mEq/L per day  Plan:  Sodium level of 128  @ 1610   Hypertonic saline infusing at rate of 50 mL/hr.  Continue current rate.  Serum sodiums ordered q4h.  Next sodium is for 2000 today.  Nephrology consulted.   Pharmacy will continue to follow.  Olene FlossMelissa D Demone Lyles, Pharm.D Clinical Pharmacist  06/17/2015

## 2015-06-17 NOTE — Telephone Encounter (Signed)
-----   Message from Stephanie AcreVishal Mungal, MD sent at 06/17/2015  1:40 PM EST ----- Regarding: hospital f/u Schedule Hospital follow-up with available provider in 2-3 weeks  Thank you

## 2015-06-17 NOTE — Progress Notes (Signed)
[facility] [place] [facility]   PATIENT NAME: Erica ChacoDonna Calvario    MR#:  [id number]  DATE OF BIRTH:  1965/12/21  SUBJECTIVE:  Patient says she is better then admission as far as SOB  She is tired as she received too many phone calls and visits from friends and family yesterday.   Review of Systems  Constitutional: Negative for fever, chills and weight loss.  HENT: Negative for congestion.   Eyes: Negative for blurred vision and double vision.  Respiratory: Positive for cough and shortness of breath. Negative for sputum production and wheezing.   Cardiovascular: Negative for chest pain, palpitations, orthopnea, leg swelling and PND.  Gastrointestinal: Negative for nausea, vomiting, abdominal pain, diarrhea, constipation and blood in stool.  Genitourinary: Negative for dysuria, urgency, frequency and hematuria.  Musculoskeletal: Negative for falls.  Neurological: Negative for dizziness, tremors, focal weakness and headaches.  Endo/Heme/Allergies: Does not bruise/bleed easily.  Psychiatric/Behavioral: Negative for depression. The patient does not have insomnia.     VITAL SIGNS: Blood pressure 96/49, pulse 93, temperature 97.5 F (36.4 C), temperature source Oral, resp. rate 18, height 5\' 8"  (1.727 m), weight 95.82 kg (211 lb 3.9 oz), last menstrual period 03/13/2015, SpO2 96 %.  PHYSICAL EXAMINATION:   GENERAL:  49 y.o.-year-old patient lying in the bed no acute distress  EYES: Pupils equal, round, reactive to light and accommodation. No scleral icterus. Extraocular muscles intact.  HEENT: Head atraumatic, normocephalic. Oropharynx and nasopharynx clear.  NECK:  Supple, no jugular venous distention. No thyroid enlargement, no tenderness.  LUNGS: CTAB no wheezing this am no crackles or rhonhii.  CARDIOVASCULAR: S1, S2 normal. No murmurs, rubs, or gallops.  ABDOMEN: Soft, nontender, nondistended. Bowel sounds present. No organomegaly or mass.  EXTREMITIES:  1+ lower extremity and pedal edema, no cyanosis, or clubbing. Dorsalis pedis pulses are palpable at 1+ NEUROLOGIC: Cranial nerves II through XII are intact. Muscle strength 5/5 in all extremities. Sensation intact. Gait not checked.  PSYCHIATRIC: The patient is alert and oriented x 3. anxious SKIN: No obvious rash, lesion, or ulcer.   ORDERS/RESULTS REVIEWED:   CBC  Recent Labs Lab 06/13/15 2205 06/14/15 0249 06/15/15 0520 06/16/15 0655  WBC 14.8* 11.9* 12.2* 11.5*  HGB 11.5* 10.5* 10.1* 9.0*  HCT 32.7* 29.7* 29.6* 26.9*  PLT 304 260 260 244  MCV 111.6* 110.4* 111.6* 112.7*  MCH 39.2* 39.1* 37.9* 37.9*  MCHC 35.1 35.4 34.0 33.7  RDW 19.2* 19.5* 19.6* 19.9*   ------------------------------------------------------------------------------------------------------------------  Chemistries   Recent Labs Lab 06/14/15 2042  06/15/15 1827  06/16/15 0655  06/16/15 1909 06/17/15 06/17/15 0450 06/17/15 0800 06/17/15 0949  NA 118*  < > 120*  < > 126*  < > 126* 127* 128* 145 127*  K 4.3  < > 4.9  --  4.6  --  5.1  --  4.2 4.1  --   CL 87*  < > 93*  --  98*  --  101  --  102 122*  --   CO2 16*  < > 18*  --  19*  --  17*  --  16* 14*  --   GLUCOSE 195*  < > 192*  --  124*  --  135*  --  126* 124*  --   BUN 20  < > 20  --  19  --  19  --  19 19  --   CREATININE 1.06*  < > 1.17*  --  1.12*  --  1.15*  --  1.17* 1.06*  --   CALCIUM 8.0*  < > 7.9*  --  7.9*  --  8.0*  --  7.9* 7.3*  --   AST 51*  --   --   --   --   --   --   --   --   --   --   ALT 15  --   --   --   --   --   --   --   --   --   --   ALKPHOS 68  --   --   --   --   --   --   --   --   --   --   BILITOT 1.8*  --   --   --   --   --   --   --   --   --   --   < > = values in this interval not displayed. ------------------------------------------------------------------------------------------------------------------ estimated creatinine clearance is 76.9 mL/min (by C-G formula based on Cr of  1.06). ------------------------------------------------------------------------------------------------------------------ No results for input(s): TSH, T4TOTAL, T3FREE, THYROIDAB in the last 72 hours.  Invalid input(s): FREET3  Cardiac Enzymes  Recent Labs Lab 06/14/15 0249 06/14/15 0800 06/14/15 1358  TROPONINI <0.03 <0.03 <0.03   ------------------------------------------------------------------------------------------------------------------ Invalid input(s): POCBNP ---------------------------------------------------------------------------------------------------------------  RADIOLOGY: No results found.  EKG:  Orders placed or performed during the hospital encounter of 06/13/15  . EKG 12-Lead  . EKG 12-Lead  . ED EKG  . ED EKG    ASSESSMENT AND PLAN:  1. Acute respiratory distress due to COPD exacerbation: Patient has currently improved.  She has no wheezing on examination.   2. COPD exacerbation: Patient appears to be improving. Continue Levaquin and DuoNeb's   3. Acute on chronic diastolic heart failure: 2-D echocardiogram showed elevated right heart pressure likely secondary to underlying lung disease with diastolic dysfunction. I will discontinue Nitropaste. Patient appears to have improved.  4. Hyponatremia: Etiology of hyponatremia is unknown at this time. Her urine sodium was less than 10. Nephrology is following. Her sodium was 145 this morning however has now decreased to 127, I am suspicious that the 145 was in error. Continue monitoring sodium and continue hypertonic solution.   5. Renal insufficiency, some improved with rehydration, follow with Lasix, unremarkable renal ultrasound, appreciated nephrology consult. 6. Acute cystitis: Continue Levaquin no urine culture was performed. 7. Elevated troponin, likely demand ischemia, echocardiogram showed normal ejection fraction, however, elevated central venous as well as pulmonary artery pressures, now  initiated on diuretic 8. Hyperglycemia. hemoglobin A1c 6.2 9. Tobacco abuse, Nicotine replacement therapy is initiated 10. Lower back pain: Continue when necessary pain meds    Management plans discussed with the patient and she is in agreement.   DRUG ALLERGIES:  Allergies  Allergen Reactions  . Tramadol Other (See Comments)    Heart palpatations  . Penicillins Rash    CODE STATUS:     Code Status Orders        Start     Ordered   06/14/15 0202  Full code   Continuous     06/14/15 0201    Advance Directive Documentation        Most Recent Value   Type of Advance Directive  Living will   Pre-existing out of facility DNR order (yellow form or pink MOST form)     "MOST" Form in Place?        TOTAL  TIME TAKING CARE OF THIS PATIENT: 25 minutes.    Brentlee Delage M.D on 06/17/2015 at 12:00 PM  Between 7am to 6pm - Pager - [phone]  After 6pm go to [email or web address] - password [email or web address] [facility]  [facility]  Office  [phone]  CC: Primary care physician; [facility]

## 2015-06-18 ENCOUNTER — Inpatient Hospital Stay: Payer: 59

## 2015-06-18 DIAGNOSIS — I272 Other secondary pulmonary hypertension: Secondary | ICD-10-CM

## 2015-06-18 DIAGNOSIS — R0602 Shortness of breath: Secondary | ICD-10-CM | POA: Insufficient documentation

## 2015-06-18 LAB — CBC
HEMATOCRIT: 22 % — AB (ref 35.0–47.0)
Hemoglobin: 7.5 g/dL — ABNORMAL LOW (ref 12.0–16.0)
MCH: 39.1 pg — ABNORMAL HIGH (ref 26.0–34.0)
MCHC: 33.9 g/dL (ref 32.0–36.0)
MCV: 115.6 fL — ABNORMAL HIGH (ref 80.0–100.0)
PLATELETS: 199 10*3/uL (ref 150–440)
RBC: 1.91 MIL/uL — ABNORMAL LOW (ref 3.80–5.20)
RDW: 20.7 % — AB (ref 11.5–14.5)
WBC: 10.4 10*3/uL (ref 3.6–11.0)

## 2015-06-18 LAB — BASIC METABOLIC PANEL
Anion gap: 4 — ABNORMAL LOW (ref 5–15)
BUN: 16 mg/dL (ref 6–20)
CO2: 13 mmol/L — ABNORMAL LOW (ref 22–32)
Calcium: 5.7 mg/dL — CL (ref 8.9–10.3)
Chloride: 117 mmol/L — ABNORMAL HIGH (ref 101–111)
Creatinine, Ser: 0.83 mg/dL (ref 0.44–1.00)
GFR calc Af Amer: >60 mL/min (ref >60)
Glucose, Bld: 98 mg/dL (ref 65–99)
POTASSIUM: 2.8 mmol/L — AB (ref 3.5–5.1)
SODIUM: 134 mmol/L — AB (ref 135–145)

## 2015-06-18 LAB — GLUCOSE, CAPILLARY
GLUCOSE-CAPILLARY: 125 mg/dL — AB (ref 65–99)
GLUCOSE-CAPILLARY: 136 mg/dL — AB (ref 65–99)
GLUCOSE-CAPILLARY: 134 mg/dL — AB (ref 65–99)
Glucose-Capillary: 124 mg/dL — ABNORMAL HIGH (ref 65–99)

## 2015-06-18 LAB — MAGNESIUM: MAGNESIUM: 2 mg/dL (ref 1.7–2.4)

## 2015-06-18 LAB — SODIUM
SODIUM: 131 mmol/L — AB (ref 135–145)
SODIUM: 130 mmol/L — AB (ref 135–145)
SODIUM: 131 mmol/L — AB (ref 135–145)
Sodium: 112 mmol/L — CL (ref 135–145)
Sodium: 129 mmol/L — ABNORMAL LOW (ref 135–145)

## 2015-06-18 LAB — HEMOGLOBIN AND HEMATOCRIT, BLOOD
HEMATOCRIT: 27.6 % — AB (ref 35.0–47.0)
Hemoglobin: 9.2 g/dL — ABNORMAL LOW (ref 12.0–16.0)

## 2015-06-18 LAB — BRAIN NATRIURETIC PEPTIDE: B Natriuretic Peptide: 802 pg/mL — ABNORMAL HIGH (ref 0.0–100.0)

## 2015-06-18 MED ORDER — SUCRALFATE 1 G PO TABS
1.0000 g | ORAL_TABLET | Freq: Three times a day (TID) | ORAL | Status: DC
  Administered 2015-06-18 – 2015-06-23 (×18): 1 g via ORAL
  Filled 2015-06-18 (×18): qty 1

## 2015-06-18 MED ORDER — SODIUM CHLORIDE 0.9 % IV SOLN
1.0000 g | Freq: Three times a day (TID) | INTRAVENOUS | Status: DC
  Administered 2015-06-18 – 2015-06-22 (×13): 1 g via INTRAVENOUS
  Filled 2015-06-18 (×17): qty 1

## 2015-06-18 MED ORDER — FUROSEMIDE 10 MG/ML IJ SOLN
20.0000 mg | Freq: Three times a day (TID) | INTRAMUSCULAR | Status: AC
  Administered 2015-06-18 – 2015-06-19 (×3): 20 mg via INTRAVENOUS
  Filled 2015-06-18 (×3): qty 2

## 2015-06-18 MED ORDER — FUROSEMIDE 10 MG/ML IJ SOLN
20.0000 mg | Freq: Once | INTRAMUSCULAR | Status: AC
  Administered 2015-06-18: 20 mg via INTRAVENOUS
  Filled 2015-06-18: qty 2

## 2015-06-18 MED ORDER — POTASSIUM CHLORIDE CRYS ER 20 MEQ PO TBCR
20.0000 meq | EXTENDED_RELEASE_TABLET | Freq: Two times a day (BID) | ORAL | Status: DC
  Administered 2015-06-18 – 2015-06-22 (×11): 20 meq via ORAL
  Filled 2015-06-18 (×12): qty 1

## 2015-06-18 MED ORDER — VANCOMYCIN HCL IN DEXTROSE 1-5 GM/200ML-% IV SOLN: 1000.0000 mg | INTRAVENOUS | Status: DC

## 2015-06-18 MED ORDER — PANTOPRAZOLE SODIUM 40 MG PO TBEC
40.0000 mg | DELAYED_RELEASE_TABLET | Freq: Every day | ORAL | Status: DC
  Administered 2015-06-18 – 2015-06-23 (×6): 40 mg via ORAL
  Filled 2015-06-18 (×6): qty 1

## 2015-06-18 MED ORDER — CALCIUM GLUCONATE 10 % IV SOLN
1.0000 g | Freq: Once | INTRAVENOUS | Status: AC
  Administered 2015-06-18: 1 g via INTRAVENOUS
  Filled 2015-06-18: qty 10

## 2015-06-18 MED ORDER — SODIUM BICARBONATE 8.4 % IV SOLN
INTRAVENOUS | Status: DC
  Administered 2015-06-18: 09:00:00 via INTRAVENOUS
  Filled 2015-06-18 (×4): qty 150

## 2015-06-18 MED ORDER — POTASSIUM CHLORIDE 10 MEQ/100ML IV SOLN
10.0000 meq | INTRAVENOUS | Status: AC
Start: 2015-06-18 — End: 2015-06-18
  Administered 2015-06-18 (×2): 10 meq via INTRAVENOUS
  Filled 2015-06-18 (×2): qty 100

## 2015-06-18 MED ORDER — POTASSIUM CHLORIDE CRYS ER 20 MEQ PO TBCR
40.0000 meq | EXTENDED_RELEASE_TABLET | Freq: Once | ORAL | Status: AC
  Administered 2015-06-18: 40 meq via ORAL
  Filled 2015-06-18: qty 2

## 2015-06-18 MED ORDER — SODIUM CHLORIDE 0.9 % IV SOLN
1250.0000 mg | Freq: Two times a day (BID) | INTRAVENOUS | Status: DC
  Administered 2015-06-18 – 2015-06-20 (×4): 1250 mg via INTRAVENOUS
  Filled 2015-06-18 (×6): qty 1250

## 2015-06-18 MED ORDER — VANCOMYCIN HCL 10 G IV SOLR
1250.0000 mg | Freq: Once | INTRAVENOUS | Status: AC
  Administered 2015-06-18: 1250 mg via INTRAVENOUS
  Filled 2015-06-18: qty 1250

## 2015-06-18 NOTE — Progress Notes (Signed)
Patient: Erica [name] / Admit Date: 06/13/2015 / Date of Encounter: 06/18/2015, 8:37 AM   Subjective: Significant shortness of breath overnight requiring IV Lasix She reports urinating out reasonably amount 2, still with significant leg edema, shortness of breath Receiving IV fluids with potassium and still on hypertonic solution this morning Potassium <3  Review of Systems: Review of Systems  Constitutional: Negative.   Respiratory: Positive for shortness of breath.   Cardiovascular: Positive for leg swelling.  Gastrointestinal: Negative.   Musculoskeletal: Negative.   Neurological: Negative.   Psychiatric/Behavioral: Negative.   All other systems reviewed and are negative.   Objective: Telemetry: NSR Physical Exam: Blood pressure 92/43, pulse 90, temperature 98.5 F (36.9 C), temperature source Oral, resp. rate 18, height  (1.727 m), weight 215 lb 13.6 oz (97.909 kg), last menstrual period 03/13/2015, SpO2 93 %. Body mass index is 32.83 kg/(m^2). General: Well developed, well nourished, mild shortness of breath Head: Normocephalic, atraumatic, sclera non-icteric, no xanthomas, nares are without discharge. Neck: Negative for carotid bruits. JVP not elevated. Lungs: dullness at the bases Heart: RRR S1 S2 without murmurs, rubs, or gallops.  Abdomen: Soft, non-tender, non-distended with normoactive bowel sounds. No rebound/guarding. Extremities: No clubbing or cyanosis. Diffuse leg edema, knee swelling  Distal pedal pulses are 2+ and equal bilaterally. Neuro: Alert and oriented X 3. Moves all extremities spontaneously. Psych:  Responds to questions appropriately with a normal affect.   Intake/Output Summary (Last 24 hours) at 06/18/15 0837 Last data filed at 06/18/15 0747  Gross per 24 hour  Intake 2204.5 ml  Output   1200 ml  Net 1004.5 ml    Inpatient Medications:  . arformoterol  15 mcg Nebulization BID  . aspirin EC  81 mg Oral Daily  . atorvastatin  20  mg Oral q1800  . calcium gluconate  1 g Intravenous Once  . enoxaparin (LOVENOX) injection  40 mg Subcutaneous Q24H  . furosemide  20 mg Intravenous Q8H  . insulin aspart  0-9 Units Subcutaneous TID AC & HS  . lidocaine  1 patch Transdermal Q24H  . magic mouthwash  10 mL Oral TID  . meropenem (MERREM) IV  1 g Intravenous 3 times per day  . nicotine  21 mg Transdermal Daily  . potassium chloride  10 mEq Intravenous Q1 Hr x 2  . potassium chloride  20 mEq Oral Daily  . vancomycin  1,250 mg Intravenous Q12H   Infusions:  .  sodium bicarbonate  infusion 1000 mL      Labs:  Recent Labs  06/17/15 0800  06/18/15 0140 06/18/15 0550  NA 145  < > 130* 134*  K 4.1  --   --  2.8*  CL 122*  --   --  117*  CO2 14*  --   --  13*  GLUCOSE 124*  --   --  98  BUN 19  --   --  16  CREATININE 1.06*  --   --  0.83  CALCIUM 7.3*  --   --  5.7*  < > = values in this interval not displayed. No results for input(s): AST, ALT, ALKPHOS, BILITOT, PROT, ALBUMIN in the last 72 hours.  Recent Labs  06/16/15 0655 06/18/15 0550  WBC 11.5* 10.4  HGB 9.0* 7.5*  HCT 26.9* 22.0*  MCV 112.7* 115.6*  PLT 244 199   No results for input(s): CKTOTAL, CKMB, TROPONINI in the last 72 hours. Invalid input(s): POCBNP No results for input(s): HGBA1C  in the last 72 hours.   Weights: Filed Weights   06/17/15 0413 06/17/15 0500 06/18/15 0500  Weight: 211 lb 3.2 oz (95.8 kg) 211 lb 3.9 oz (95.82 kg) 215 lb 13.6 oz (97.909 kg)     Radiology/Studies:  Dg Chest 1 View  06/18/2015  CLINICAL DATA:  Dyspnea EXAM: CHEST 1 VIEW COMPARISON:  06/15/2015 FINDINGS: There is a satisfactorily positioned right upper extremity PICC line. There is worsening confluent airspace opacity in the central and basilar left lung. No large effusion. Right lung is clear. No pneumothorax. IMPRESSION: Worsening left lung airspace opacity. This could represent pneumonia. Asymmetric alveolar edema is less likely. Aspiration not excluded.  Electronically Signed   By: Ellery Plunkaniel R Mitchell M.D.   On: 06/18/2015 02:33    Ct Angio Chest Pe W/cm &/or Wo Cm  06/14/2015 IMPRESSION: 1. No evidence acute pulmonary embolism. 2. Ground-glass opacities in the periphery of the LEFT upper lobe suggest mild pulmonary infection. 3. Mild mediastinal and hilar lymphadenopathy is likely reactive. 4. Interval enlargement of 2 small RIGHT upper lobe pulmonary nodules. 5. With the findings of infection, adenopathy, and potentially enlarging nodules coupled with smoking history, recommend follow-up CT without contrast in 3 months to re-evaluate. Electronically Signed   By: Genevive BiStewart  Edmunds M.D.   On: 06/14/2015 11:39   [place] Renal  06/14/2015   IMPRESSION: No hydronephrosis or diagnostic renal calculus. Suboptimal exam due to patient's large body habitus. Electronically Signed   By: Natasha MeadLiviu  Pop M.D.   On: 06/14/2015 14:26   [place] Venous Img Lower Bilateral  06/14/2015 IMPRESSION: No evidence of deep venous thrombosis bilateral lower extremity. Left inguinal lymph node measures 2.8 x 1.9 cm. Electronically Signed   By: Natasha MeadLiviu  Pop M.D.   On: 06/14/2015 14:31   Dg Chest Port 1 View  06/15/2015   IMPRESSION: Slight worsening hazy left infrahilar opacification which may be due to infection. Right-sided PICC line with tip over the distal SVC. Electronically Signed   By: Elberta Fortisaniel  Boyle M.D.   On: 06/15/2015 12:23     Assessment and Plan   1. Respiratory distress/shortness of breath: upper respiratory infection,   acute on chronic diastolic CHF, pulmonary hypertension -Moderately elevated right heart pressures seen on echocardiogram, RVSP 56 -----Symptoms yesterday and overnight consistent with acute on chronic diastolic CHF Clinical exam concerning for worsening pleural effusions Would continue Lasix every 8 times several doses until shortness of breath, leg edema improved --Consider holding hypertonic saline solution If she continues to have problems with low  sodium, consider samsca  2. Acute on chronic diastolic CHF: -Echo showing Elevated right heart pressures likely secondary to underlying lung disease, diastolic dysfunction Diuresis as above  3. COPD exacerbation: -Improving -Managed by pulmonary -Antibiotic, nebulizer  4. Hyponatremia: -Up to 134 this AM,  Would hold hypertonic saline   5. Leg edema: Worse, lasix as above  6. Thrush: -Long history of thrush over the past several months, previously treated with swish and swallow as well as nystatin got better but then came back -May have started with steroid inhaler,QVAR -Will defer management to medical team  7. Chronic back pain: -Per IM/PCP  8. Anxiety: -Per IM   Dossie Arbourim Brynlei Klausner, MD [facility] 06/18/2015, 8:37 AM

## 2015-06-18 NOTE — Progress Notes (Addendum)
ANTIBIOTIC CONSULT NOTE - INITIAL  Pharmacy Consult for vancomycin and meropenem dosing Indication: HCAP  Allergies  Allergen Reactions  . Tramadol Other (See Comments)    Heart palpatations  . Penicillins Rash    Patient Measurements: Height: 5\' 8"  (172.7 cm) Weight: 211 lb 3.9 oz (95.82 kg) IBW/kg (Calculated) : 63.9 Adjusted Body Weight: n/a  Vital Signs: Temp: 97.7 F (36.5 C) (11/29 2353) Temp Source: Oral (11/29 2353) BP: 97/50 mmHg (11/29 2353) Pulse Rate: 90 (11/29 2353) Intake/Output from previous day: 11/29 0701 - 11/30 0700 In: 360 [P.O.:360] Out: 300 [Urine:300] Intake/Output from this shift:    Labs:  Recent Labs  06/15/15 0520  06/16/15 0655 06/16/15 1909 06/17/15 0450 06/17/15 0800  WBC 12.2*  --  11.5*  --   --   --   HGB 10.1*  --  9.0*  --   --   --   PLT 260  --  244  --   --   --   CREATININE 0.96  < > 1.12* 1.15* 1.17* 1.06*  < > = values in this interval not displayed. Estimated Creatinine Clearance: 76.9 mL/min (by C-G formula based on Cr of 1.06). No results for input(s): VANCOTROUGH, VANCOPEAK, VANCORANDOM, GENTTROUGH, GENTPEAK, GENTRANDOM, TOBRATROUGH, TOBRAPEAK, TOBRARND, AMIKACINPEAK, AMIKACINTROU, AMIKACIN in the last 72 hours.   Microbiology: Recent Results (from the past 720 hour(s))  MRSA PCR Screening     Status: None   Collection Time: 06/14/15  2:01 AM  Result Value Ref Range Status   MRSA by PCR NEGATIVE NEGATIVE Final    Comment:        The GeneXpert MRSA Assay (FDA approved for NASAL specimens only), is one component of a comprehensive MRSA colonization surveillance program. It is not intended to diagnose MRSA infection nor to guide or monitor treatment for MRSA infections.   Culture, blood (routine x 2)     Status: None (Preliminary result)   Collection Time: 06/14/15  8:56 AM  Result Value Ref Range Status   Specimen Description BLOOD  BLD RIGHT ARM  Final   Special Requests   Final    BOTTLES DRAWN AEROBIC  AND ANAEROBIC  7CC AERO, 4CC ANAERO   Culture NO GROWTH 2 DAYS  Final   Report Status PENDING  Incomplete  Culture, blood (routine x 2)     Status: None (Preliminary result)   Collection Time: 06/14/15  9:04 AM  Result Value Ref Range Status   Specimen Description BLOOD RIGHT ASSIST CONTROL  Final   Special Requests   Final    BOTTLES DRAWN AEROBIC AND ANAEROBIC  4CC AERO, 8CC ANAERO   Culture NO GROWTH 2 DAYS  Final   Report Status PENDING  Incomplete    Medical History: Past Medical History  Diagnosis Date  . Arthritis   . COPD (chronic obstructive pulmonary disease) (HCC)   . Gastric ulcer   . OSA on CPAP no ins - no cpap machine for the last 4 years  . Asthma   . Hypertension     Medications:   Assessment: Blood cx NGTD CXR: worsening left lung opacity   Goal of Therapy:  Resolve infection Vancomycin trough 15-20.   Plan:  TBW 95.8kg  IBW 63.9kg  DW 77kg  Vd 54L kei 0.068 hr-1  T1/2 10 hours Vancomycin 1250 mg q 12 hours ordered with stacked dosing. Level before 5th dose  Meropenem 1 gram q 8 hours ordered by MD. OK for pt parameters.  Shardee Dieu S 06/18/2015,2:55 AM

## 2015-06-18 NOTE — Progress Notes (Signed)
Feeling better, but still feels dyspneic. Currently oxygen saturation is 95% on room air.  Filed Vitals:   06/18/15 0500 06/18/15 0757 06/18/15 0806 06/18/15 1101  BP:   92/43 96/37  Pulse:   90 91  Temp:   98.5 F (36.9 C) 97.6 F (36.4 C)  TempSrc:    Oral  Resp:   18 18  Height:      Weight: 97.909 kg (215 lb 13.6 oz)     SpO2:  92% 93% 91%   Appears chronically ill No overt distress No JVD noted Bibasilar crackles, no wheezes RRR s M NABS Trace symmetric LE edema  BMP Latest Ref Rng 06/18/2015 06/18/2015 06/18/2015  Glucose 65 - 99 mg/dL - 98 -  BUN 6 - 20 mg/dL - 16 -  Creatinine 1.610.44 - 1.00 mg/dL - 0.960.83 -  Sodium 045135 - 145 mmol/L 112(LL) 134(L) 130(L)  Potassium 3.5 - 5.1 mmol/L - 2.8(LL) -  Chloride 101 - 111 mmol/L - 117(H) -  CO2 22 - 32 mmol/L - 13(L) -  Calcium 8.9 - 10.3 mg/dL - 5.7(LL) -    CBC Latest Ref Rng 06/18/2015 06/18/2015 06/16/2015  WBC 3.6 - 11.0 K/uL - 10.4 11.5(H)  Hemoglobin 12.0 - 16.0 g/dL 4.0(J9.2(L) 7.5(L) 9.0(L)  Hematocrit 35.0 - 47.0 % 27.6(L) 22.0(L) 26.9(L)  Platelets 150 - 440 K/uL - 199 244      IMPRESSION: Acute respiratory failure Heavy smoker Patchy bilateral infiltrates - inflammatory vs edema Pulmonary nodules - indeterminant in appearance Hyponatremia - worrisome in setting of pulmonary nodules  PLAN/REC: Cont current Rx - on pulmicort and started brovana 11/29 Counseled on smoking cessation She should have F/U with Pulm Medicine after discharge Will need repeat CT chest in 3 months to follow lung nodules Patient with chronic thrush, may not able to use COPD inhalers, might need nebulizers as part of COPD treatment upon discharge.  Pulmonary service will sign off at this time. Please call if there are any further questions or concerns. The patient has artery been set up [facility]-15 @ 9:30am.   Wells Guileseep Aaro Meyers, M.D.

## 2015-06-18 NOTE — Consult Note (Signed)
GI Inpatient Consult Note  Reason for Consult: Anorexia, Nausea   Attending Requesting Consult: Dr. Marga Melnick  History of Present Illness: Erica [name] is a 49 y.o. female who was being treated for COPD exacerbation complains that she has had little desire to eat and will become nauseous after taking just a couple of bites of food.  She reports that this is been ongoing for the last 6 months, does not occur with every meal.  She reports she has an unusual schedule and usually just eats dinner, may have a sandwich at lunch occasionally.  She reports she has not eaten much since she has been here at the hospital, yesterday she was able to eat after her husband brought her a fish filet sandwich and some [place] fries from [employer].  She was able to eat about half of the meal and did not get nauseous.  She reports a history of nine bleeding ulcers found on an upper endoscopy approximately 15 years ago, unfortunately we do not have record of this.  She reports she was treated with Prevacid for many years.  She reports heartburn approximately 1 time a month, will use Prevacid over the counter on occasion with relief. She does not think she has have had h. Pylori.  She has not weighed herself in a while but feels she has lost some weight over the last 6 months from not being able to eat due to the change in the way her clothes fit.  She denies NSAID use, will use Tylenol occasionally when she is unable to get Percocet for her 3 bulging disc.  She has never had knee surgeries on her stomach, never had a colonoscopy.  She does have a significant smoking history, is still a current everyday smoker. She does not require O2 at home.  Past Medical History:  Past Medical History  Diagnosis Date  . Arthritis   . COPD (chronic obstructive pulmonary disease) (HCC)   . Gastric ulcer   . OSA on CPAP no ins - no cpap machine for the last 4 years  . Asthma   . Hypertension     Problem List: Patient  Active Problem List   Diagnosis Date Noted  . SOB (shortness of breath)   . Renal insufficiency   . Pulmonary HTN (HCC)   . Acute on chronic diastolic CHF (congestive heart failure) (HCC)   . Chest pain at rest 06/14/2015  . Hyponatremia 06/14/2015  . Shortness of breath   . Swelling   . Cough   . COPD (chronic obstructive pulmonary disease) with acute bronchitis (HCC)     Past Surgical History: Past Surgical History  Procedure Laterality Date  . Knee surgery Left     8 knee surgeries    Allergies: Allergies  Allergen Reactions  . Tramadol Other (See Comments)    Heart palpatations  . Penicillins Rash    Home Medications: Prescriptions prior to admission  Medication Sig Dispense Refill Last Dose  . albuterol (PROVENTIL HFA;VENTOLIN HFA) 108 (90 BASE) MCG/ACT inhaler Inhale 2 puffs into the lungs every 6 (six) hours as needed for wheezing or shortness of breath.   Past Week at Unknown time  . beclomethasone (QVAR) 40 MCG/ACT inhaler Inhale 1 puff into the lungs 2 (two) times daily.   Not Taking at Unknown time   Home medication reconciliation was completed with the patient.   Scheduled Inpatient Medications:   . arformoterol  15 mcg Nebulization BID  . aspirin EC  81 mg  Oral Daily  . atorvastatin  20 mg Oral q1800  . enoxaparin (LOVENOX) injection  40 mg Subcutaneous Q24H  . furosemide  20 mg Intravenous Q8H  . insulin aspart  0-9 Units Subcutaneous TID AC & HS  . lidocaine  1 patch Transdermal Q24H  . magic mouthwash  10 mL Oral TID  . meropenem (MERREM) IV  1 g Intravenous 3 times per day  . nicotine  21 mg Transdermal Daily  . potassium chloride  20 mEq Oral BID  . vancomycin  1,250 mg Intravenous Q12H    Continuous Inpatient Infusions:   .  sodium bicarbonate  infusion 1000 mL 50 mL/hr at 06/18/15 0851    PRN Inpatient Medications:  acetaminophen, albuterol, ipratropium-albuterol, LORazepam, nicotine polacrilex, nitroGLYCERIN, ondansetron (ZOFRAN) IV,  oxyCODONE-acetaminophen  Family History: family history includes Breast cancer in her mother; Diabetes in her maternal grandmother; Lung cancer in her father.    Social History:   reports that she has been smoking Cigarettes.  She has a 54 pack-year smoking history. She does not have any smokeless tobacco history on file. She reports that she drinks alcohol. She reports that she does not use illicit drugs.    Review of Systems: Constitutional: Positive for weight loss.  Eyes: No changes in vision. ENT: No oral lesions, sore throat.  GI: see HPI.  Heme/Lymph: No easy bruising.  CV: No chest pain.  GU: No hematuria.  Integumentary: No rashes.  Neuro: No headaches.  Psych: No depression/anxiety.  Endocrine: No heat/cold intolerance.  Allergic/Immunologic: No urticaria.  Resp: No cough, SOB.  Musculoskeletal: No joint swelling.    Physical Examination: BP 96/37 mmHg  Pulse 91  Temp(Src) 97.6 F (36.4 C) (Oral)  Resp 18  Ht  (1.727 m)  Wt 97.909 kg (215 lb 13.6 oz)  BMI 32.83 kg/m2  SpO2 91%  LMP 03/13/2015 (Approximate) Gen: NAD, alert and oriented x 4 HEENT: PEERLA, EOMI, Neck: supple, no JVD or thyromegaly Chest: CTA bilaterally, no wheezes, crackles, or other adventitious sounds CV: RRR, no m/g/c/r Abd: soft, slight generalized tenderness, ND, +BS in all four quadrants; no HSM, guarding, ridigity, or rebound tenderness Ext: no edema, well perfused with 2+ pulses, Skin: no rash or lesions noted Lymph: no LAD  Data: Lab Results  Component Value Date   WBC 10.4 06/18/2015   HGB 9.2* 06/18/2015   HCT 27.6* 06/18/2015   MCV 115.6* 06/18/2015   PLT 199 06/18/2015    Recent Labs Lab 06/16/15 0655 06/18/15 0550 06/18/15 1012  HGB 9.0* 7.5* 9.2*   Lab Results  Component Value Date   NA 112* 06/18/2015   K 2.8* 06/18/2015   CL 117* 06/18/2015   CO2 13* 06/18/2015   BUN 16 06/18/2015   CREATININE 0.83 06/18/2015   Lab Results  Component Value Date    ALT 15 06/14/2015   AST 51* 06/14/2015   ALKPHOS 68 06/14/2015   BILITOT 1.8* 06/14/2015   No results for input(s): APTT, INR, PTT in the last 168 hours.  Imaging:  CLINICAL DATA: Short of breath and chest tightness. Chest pain for several weeks with productive cough. Concern pulmonary embolism. Smoking history.  EXAM: CT ANGIOGRAPHY CHEST WITH CONTRAST  TECHNIQUE: Multidetector CT imaging of the chest was performed using the standard protocol during bolus administration of intravenous contrast. Multiplanar CT image reconstructions and MIPs were obtained to evaluate the vascular anatomy.  CONTRAST: 75mL OMNIPAQUE IOHEXOL 350 MG/ML SOLN  COMPARISON: CT 03/23/2013  FINDINGS: Mediastinum/Nodes: No filling defects within  the pulmonary suggest acute pulmonary embolism. No acute findings aorta great vessels.  Mildly enlarged paratracheal lymph nodes up to 12 mm. Subcarinal lymph node similar 13 mm. There is mild bilateral hilar lymphadenopathy.  No pericardial fluid. Esophagus is normal.  Lungs/Pleura: Small nodule in the RIGHT upper lobe measures 5 mm on image 62, series 6. nodule appears slightly increased from 3 mm on prior. 3 mm RIGHT upper lobe nodule on image 67, series 6 is increased from 2 mm on prior.  There is peripheral ground-glass opacity in the LEFT upper lobe (image 43 through 59).  Upper abdomen: Limited view of the liver, kidneys, pancreas are unremarkable. Normal adrenal glands.  Musculoskeletal: No acute osseous abnormality.  Review of the MIP images confirms the above findings.  IMPRESSION: 1. No evidence acute pulmonary embolism. 2. Ground-glass opacities in the periphery of the LEFT upper lobe suggest mild pulmonary infection. 3. Mild mediastinal and hilar lymphadenopathy is likely reactive. 4. Interval enlargement of 2 small RIGHT upper lobe pulmonary nodules. 5. With the findings of infection, adenopathy, and  potentially enlarging nodules coupled with smoking history, recommend follow-up CT without contrast in 3 months to re-evaluate.   Electronically Signed  By: Genevive BiStewart Edmunds M.D.  On: 06/14/2015 11:39  CLINICAL DATA: Dyspnea  EXAM: CHEST 1 VIEW  COMPARISON: 06/15/2015  FINDINGS: There is a satisfactorily positioned right upper extremity PICC line. There is worsening confluent airspace opacity in the central and basilar left lung. No large effusion. Right lung is clear. No pneumothorax.  IMPRESSION: Worsening left lung airspace opacity. This could represent pneumonia. Asymmetric alveolar edema is less likely. Aspiration not excluded.   Electronically Signed  By: Ellery Plunkaniel R Mitchell M.D.  On: 06/18/2015 02:33   Assessment/Plan: [name] is a 49 y.o. female with 6 months of anorexia and nausea after beginning to eat. History of gastric ulcers.   Recommendations: We recommend h. Pylori serology, daily PPI coverage, and Carafate 30 minutes before each meal.  She should follow up in our office as an outpatient for further evaluation.  No endoscopy is recommended at this time due to pulmonary status.  We will continue to follow with you. Thank you for the consult. Please call with questions or concerns.  Carney Harderari S Richards, PA-C  I personally performed these services.

## 2015-06-18 NOTE — Progress Notes (Signed)
Called by nursing for patient with complaint of increasing SOB and extremity swelling.  CXR showed significantly worse left lung opacity.  Infiltrate favored over asymmetric edema, aspiration not ruled out.  IV lasix given, and antibiotics changed to cover broadly for HCAP.  Kristeen MissWILLIS, Lior Hoen FIELDING [facility] 06/18/2015, 2:54 AM

## 2015-06-18 NOTE — Progress Notes (Signed)
Erica LeashDonna c/o increased swelling in arms and legs, and increased shortness of breath, 0100 Sodium lab is 130, lung sounds clear, O2 95% RA, BP 97/50, afebrile. Dr Anne HahnWillis, MD on-call notified. STAT chest x-ray ordered, showing worsening left lung side opacity.  Gentle diuresis ordered, Lasix 20mg . Nursing continues to monitor and assess.

## 2015-06-18 NOTE — Consult Note (Signed)
Pt with signif lung disease with some right heart effects, All related to smoking.  Unusual electrolyte abnormalities with severe hyponatremia.  Due to hx of PUD and nausea and pain after eating will try PPI and carafate.  Due to lung disease no planned EGD at this time.  We will follow

## 2015-06-18 NOTE — Progress Notes (Signed)
[facility]  ROUNDING NOTE   Subjective:  Sodium up to 134. Potassium low at 2.8. Developed shortness of breath overnight requiring Lasix. 3% saline was discontinued earlier this a.m.    Objective:  Vital signs in last 24 hours:  Temp:  [97.5 F (36.4 C)-98.6 F (37 C)] 98.5 F (36.9 C) (11/30 0806) Pulse Rate:  [89-97] 90 (11/30 0806) Resp:  [18] 18 (11/30 0806) BP: (92-111)/(42-53) 92/43 mmHg (11/30 0806) SpO2:  [90 %-98 %] 93 % (11/30 0806) Weight:  [97.909 kg (215 lb 13.6 oz)] 97.909 kg (215 lb 13.6 oz) (11/30 0500)  Weight change: 2.109 kg (4 lb 10.4 oz) Filed Weights   06/17/15 0413 06/17/15 0500 06/18/15 0500  Weight: 95.8 kg (211 lb 3.2 oz) 95.82 kg (211 lb 3.9 oz) 97.909 kg (215 lb 13.6 oz)    Intake/Output: I/O last 3 completed shifts: In: 2324.5 [P.O.:360; I.V.:1964.5] Out: 1500 [Urine:1500]   Intake/Output this shift:  Total I/O In: 120 [P.O.:120] Out: 0   Physical Exam: General: NAD, resting in bed   Head: Normocephalic, atraumatic. Moist oral mucosal membranes  Eyes: Anicteric  Neck: Supple, trachea midline  Lungs:   basilar rales, normal effort   Heart: Regular rate and rhythm  Abdomen:  Soft, nontender, bowel sounds present   Extremities:  trace peripheral edema.  Neurologic: Nonfocal, moving all four extremities  Skin: No lesions       Basic Metabolic Panel:  Recent Labs Lab 06/16/15 0655  06/16/15 1909  06/17/15 0450 06/17/15 0800 06/17/15 0949 06/17/15 1610 06/17/15 1821 06/18/15 0140 06/18/15 0550  NA 126*  < > 126*  < > 128* 145 127* 128* 130* 130* 134*  K 4.6  --  5.1  --  4.2 4.1  --   --   --   --  2.8*  CL 98*  --  101  --  102 122*  --   --   --   --  117*  CO2 19*  --  17*  --  16* 14*  --   --   --   --  13*  GLUCOSE 124*  --  135*  --  126* 124*  --   --   --   --  98  BUN 19  --  19  --  19 19  --   --   --   --  16  CREATININE 1.12*  --  1.15*  --  1.17* 1.06*  --   --   --   --  0.83  CALCIUM 7.9*   --  8.0*  --  7.9* 7.3*  --   --   --   --  5.7*  < > = values in this interval not displayed.  Liver Function Tests:  Recent Labs Lab 06/14/15 2042  AST 51*  ALT 15  ALKPHOS 68  BILITOT 1.8*  PROT 6.6  ALBUMIN 2.9*   No results for input(s): LIPASE, AMYLASE in the last 168 hours. No results for input(s): AMMONIA in the last 168 hours.  CBC:  Recent Labs Lab 06/13/15 2205 06/14/15 0249 06/15/15 0520 06/16/15 0655 06/18/15 0550  WBC 14.8* 11.9* 12.2* 11.5* 10.4  HGB 11.5* 10.5* 10.1* 9.0* 7.5*  HCT 32.7* 29.7* 29.6* 26.9* 22.0*  MCV 111.6* 110.4* 111.6* 112.7* 115.6*  PLT 304 260 260 244 199    Cardiac Enzymes:  Recent Labs Lab 06/13/15 2205 06/14/15 0249 06/14/15 0800 06/14/15 1358  TROPONINI 0.06* <0.03 <0.03 <0.03  BNP: Invalid input(s): POCBNP  CBG:  Recent Labs Lab 06/17/15 0714 06/17/15 1114 06/17/15 1605 06/17/15 2117 06/18/15 0804  GLUCAP 127* 113* 138* 131* 125*    Microbiology: Results for orders placed or performed during the hospital encounter of 06/13/15  MRSA PCR Screening     Status: None   Collection Time: 06/14/15  2:01 AM  Result Value Ref Range Status   MRSA by PCR NEGATIVE NEGATIVE Final    Comment:        The GeneXpert MRSA Assay (FDA approved for NASAL specimens only), is one component of a comprehensive MRSA colonization surveillance program. It is not intended to diagnose MRSA infection nor to guide or monitor treatment for MRSA infections.   Culture, blood (routine x 2)     Status: None (Preliminary result)   Collection Time: 06/14/15  8:56 AM  Result Value Ref Range Status   Specimen Description BLOOD  BLD RIGHT ARM  Final   Special Requests   Final    BOTTLES DRAWN AEROBIC AND ANAEROBIC  7CC AERO, 4CC ANAERO   Culture NO GROWTH 4 DAYS  Final   Report Status PENDING  Incomplete  Culture, blood (routine x 2)     Status: None (Preliminary result)   Collection Time: 06/14/15  9:04 AM  Result Value Ref Range  Status   Specimen Description BLOOD RIGHT ASSIST CONTROL  Final   Special Requests   Final    BOTTLES DRAWN AEROBIC AND ANAEROBIC  4CC AERO, 8CC ANAERO   Culture NO GROWTH 4 DAYS  Final   Report Status PENDING  Incomplete    Coagulation Studies: No results for input(s): LABPROT, INR in the last 72 hours.  Urinalysis: No results for input(s): COLORURINE, LABSPEC, PHURINE, GLUCOSEU, HGBUR, BILIRUBINUR, KETONESUR, PROTEINUR, UROBILINOGEN, NITRITE, LEUKOCYTESUR in the last 72 hours.  Invalid input(s): APPERANCEUR    Imaging: Dg Chest 1 View  06/18/2015  CLINICAL DATA:  Dyspnea EXAM: CHEST 1 VIEW COMPARISON:  06/15/2015 FINDINGS: There is a satisfactorily positioned right upper extremity PICC line. There is worsening confluent airspace opacity in the central and basilar left lung. No large effusion. Right lung is clear. No pneumothorax. IMPRESSION: Worsening left lung airspace opacity. This could represent pneumonia. Asymmetric alveolar edema is less likely. Aspiration not excluded. Electronically Signed   By: Ellery Plunkaniel R Mitchell M.D.   On: 06/18/2015 02:33     Medications:   .  sodium bicarbonate  infusion 1000 mL 50 mL/hr at 06/18/15 0851   . arformoterol  15 mcg Nebulization BID  . aspirin EC  81 mg Oral Daily  . atorvastatin  20 mg Oral q1800  . calcium gluconate  1 g Intravenous Once  . enoxaparin (LOVENOX) injection  40 mg Subcutaneous Q24H  . furosemide  20 mg Intravenous Q8H  . insulin aspart  0-9 Units Subcutaneous TID AC & HS  . lidocaine  1 patch Transdermal Q24H  . magic mouthwash  10 mL Oral TID  . meropenem (MERREM) IV  1 g Intravenous 3 times per day  . nicotine  21 mg Transdermal Daily  . potassium chloride  20 mEq Oral BID  . vancomycin  1,250 mg Intravenous Q12H   acetaminophen, albuterol, ipratropium-albuterol, LORazepam, nicotine polacrilex, nitroGLYCERIN, ondansetron (ZOFRAN) IV, oxyCODONE-acetaminophen  Assessment/ Plan:  49 y.o. female with a PMHx of COPD,  gastric ulcer, osteoarthritis, tobacco abuse, who [facility] for evaluation of shortness of breath and cough.  1. Hyponatremia.  Urine Na was very low at <  10.   2. Oral thrush.  Improved 3. Urinary tract infection. 4.  Metabolic acidosis due to use of hypertonic saline. 5.  Hypokalemia.  Plan:   serum sodium was improved to 134 this a.m. Therefore we subsequently discontinued 3% saline. She also developed shortness of breath. There was asymmetric edema noted to be on the left. She has been started on meropenem. However there was some attentional 4 underlying pulmonary edema and therefore patient was started on Lasix 21 g IV every 8 hours which can be continued at this time. Agree with potassium repletion as well today. We have discontinued 3% saline in favor of sodium bicarbonate at 50 cc per hour given the metabolic acidosis which developed secondary to the 3% saline.   LOS: 4 Erica [name] 11/30/201610:16 AM

## 2015-06-18 NOTE — Progress Notes (Addendum)
[facility] [place] at [facility]   PATIENT NAME: Erica ChacoDonna [name]    MR#:  [id number]  DATE OF BIRTH:  1966/01/14  SUBJECTIVE:  Over last night had worsening SOB and found increased infiltrate in lung, and leg swelling.   On broad spectrum ABx now.   Review of Systems  Constitutional: Negative for fever, chills and weight loss.  HENT: Negative for congestion.   Eyes: Negative for blurred vision and double vision.  Respiratory: Positive for cough and shortness of breath. Negative for sputum production and wheezing.   Cardiovascular: Positive for leg swelling. Negative for chest pain, palpitations, orthopnea and PND.  Gastrointestinal: Negative for nausea, vomiting, abdominal pain, diarrhea, constipation and blood in stool.  Genitourinary: Negative for dysuria, urgency, frequency and hematuria.  Musculoskeletal: Negative for falls.  Neurological: Negative for dizziness, tremors, focal weakness and headaches.  Endo/Heme/Allergies: Does not bruise/bleed easily.  Psychiatric/Behavioral: Negative for depression. The patient does not have insomnia.     VITAL SIGNS: Blood pressure 102/43, pulse 88, temperature 98.2 F (36.8 C), temperature source Oral, resp. rate 18, height 5\' 8"  (1.727 m), weight 97.909 kg (215 lb 13.6 oz), last menstrual period 03/13/2015, SpO2 95 %.  PHYSICAL EXAMINATION:   GENERAL:  49 y.o.-year-old patient lying in the bed no acute distress  EYES: Pupils equal, round, reactive to light and accommodation. No scleral icterus. Extraocular muscles intact.  HEENT: Head atraumatic, normocephalic. Oropharynx and nasopharynx clear.  NECK:  Supple, no jugular venous distention. No thyroid enlargement, no tenderness. LUNGS:  no wheezing this am some crackles present. CARDIOVASCULAR: S1, S2 normal. No murmurs, rubs, or gallops.  ABDOMEN: Soft, nontender, nondistended. Bowel sounds present. No organomegaly or mass.  EXTREMITIES: 1+ lower extremity and  pedal edema, no cyanosis, or clubbing. Dorsalis pedis pulses are palpable at 1+ NEUROLOGIC: Cranial nerves II through XII are intact. Muscle strength 5/5 in all extremities. Sensation intact. Gait not checked.  PSYCHIATRIC: The patient is alert and oriented x 3. anxious SKIN: No obvious rash, lesion, or ulcer.   ORDERS/RESULTS REVIEWED:   CBC  Recent Labs Lab 06/13/15 2205 06/14/15 0249 06/15/15 0520 06/16/15 0655 06/18/15 0550 06/18/15 1012  WBC 14.8* 11.9* 12.2* 11.5* 10.4  --   HGB 11.5* 10.5* 10.1* 9.0* 7.5* 9.2*  HCT 32.7* 29.7* 29.6* 26.9* 22.0* 27.6*  PLT 304 260 260 244 199  --   MCV 111.6* 110.4* 111.6* 112.7* 115.6*  --   MCH 39.2* 39.1* 37.9* 37.9* 39.1*  --   MCHC 35.1 35.4 34.0 33.7 33.9  --   RDW 19.2* 19.5* 19.6* 19.9* 20.7*  --    ------------------------------------------------------------------------------------------------------------------  Chemistries   Recent Labs Lab 06/14/15 2042  06/16/15 0655  06/16/15 1909  06/17/15 0450 06/17/15 0800  06/17/15 1229  06/17/15 1821 06/18/15 0140 06/18/15 0550 06/18/15 0902 06/18/15 1818  NA 118*  < > 126*  < > 126*  < > 128* 145  < > 131*  < > 130* 130* 134* 112* 131*  K 4.3  < > 4.6  --  5.1  --  4.2 4.1  --   --   --   --   --  2.8*  --   --   CL 87*  < > 98*  --  101  --  102 122*  --   --   --   --   --  117*  --   --   CO2 16*  < > 19*  --  17*  --  16* 14*  --   --   --   --   --  13*  --   --   GLUCOSE 195*  < > 124*  --  135*  --  126* 124*  --   --   --   --   --  98  --   --   BUN 20  < > 19  --  19  --  19 19  --   --   --   --   --  16  --   --   CREATININE 1.06*  < > 1.12*  --  1.15*  --  1.17* 1.06*  --   --   --   --   --  0.83  --   --   CALCIUM 8.0*  < > 7.9*  --  8.0*  --  7.9* 7.3*  --   --   --   --   --  5.7*  --   --   MG  --   --   --   --   --   --   --   --   --  2.0  --   --   --   --   --   --   AST 51*  --   --   --   --   --   --   --   --   --   --   --   --   --   --   --    ALT 15  --   --   --   --   --   --   --   --   --   --   --   --   --   --   --   ALKPHOS 68  --   --   --   --   --   --   --   --   --   --   --   --   --   --   --   BILITOT 1.8*  --   --   --   --   --   --   --   --   --   --   --   --   --   --   --   < > = values in this interval not displayed. ------------------------------------------------------------------------------------------------------------------ estimated creatinine clearance is 99.2 mL/min (by C-G formula based on Cr of 0.83). ------------------------------------------------------------------------------------------------------------------ No results for input(s): TSH, T4TOTAL, T3FREE, THYROIDAB in the last 72 hours.  Invalid input(s): FREET3  Cardiac Enzymes  Recent Labs Lab 06/14/15 0249 06/14/15 0800 06/14/15 1358  TROPONINI <0.03 <0.03 <0.03   ------------------------------------------------------------------------------------------------------------------ Invalid input(s): POCBNP ---------------------------------------------------------------------------------------------------------------  RADIOLOGY: Dg Chest 1 View  06/18/2015  CLINICAL DATA:  Dyspnea EXAM: CHEST 1 VIEW COMPARISON:  06/15/2015 FINDINGS: There is a satisfactorily positioned right upper extremity PICC line. There is worsening confluent airspace opacity in the central and basilar left lung. No large effusion. Right lung is clear. No pneumothorax. IMPRESSION: Worsening left lung airspace opacity. This could represent pneumonia. Asymmetric alveolar edema is less likely. Aspiration not excluded. Electronically Signed   By: Ellery Plunk M.D.   On: 06/18/2015 02:33    ASSESSMENT AND PLAN:  1. Acute respiratory distress due to COPD exacerbation:    Had worsening on 06/17/15 night=- found worsening in infiltrate.  Likely pneumonia    Started on broad spectrum ABx.  2. COPD exacerbation:  Broad spectrum ABx and and DuoNeb's   3. Acute  on chronic diastolic heart failure: 2-D echocardiogram showed elevated right heart pressure likely secondary to underlying lung disease with diastolic dysfunction.  discontinue Nitropaste.   Slightly worsening- cont lasix.   Cardiology on case.  4. Hyponatremia: Etiology of hyponatremia is unknown at this time. Her urine sodium was less than 10. Nephrology is following.  Continue monitoring sodium and continue hypertonic solution.  5. Renal insufficiency, some improved with rehydration, follow with Lasix, unremarkable renal ultrasound, appreciated nephrology consult. 6. Acute cystitis: Continue Levaquin no urine culture was performed. 7. Elevated troponin, likely demand ischemia, echocardiogram showed normal ejection fraction, however, elevated central venous as well as pulmonary artery pressures, now initiated on diuretic 8. Hyperglycemia. hemoglobin A1c 6.2 9. Tobacco abuse, Nicotine replacement therapy is initiated 10. Lower back pain: Continue when necessary pain meds  11. C/o abdominal discomfort after eating, and Hx of peptic u.cer disease.    Called GI consult.  Management plans discussed with the patient and she is in agreement.   DRUG ALLERGIES:  Allergies  Allergen Reactions  . Tramadol Other (See Comments)    Heart palpatations  . Penicillins Rash    CODE STATUS:     Code Status Orders        Start     Ordered   06/14/15 0202  Full code   Continuous     06/14/15 0201    Advance Directive Documentation        Most Recent Value   Type of Advance Directive  Living will   Pre-existing out of facility DNR order (yellow form or pink MOST form)     "MOST" Form in Place?        TOTAL TIME TAKING CARE OF THIS PATIENT: 25 minutes.    Altamese Dilling M.D on 06/18/2015 at 9:53 PM  Between 7am to 6pm - Pager - [phone]  After 6pm go to [email or web address] - password [email or web address] [facility]  [place] [facility]  Office  [phone]  CC: Primary care physician;  [facility]

## 2015-06-18 NOTE — Progress Notes (Signed)
MD on-call notified of Erica [name]'s critical lab values Calcium 5.7 and K 2.8, also notified hmgb 7.5. Nursing to carry out orders and continue to monitor.

## 2015-06-19 ENCOUNTER — Inpatient Hospital Stay: Payer: 59

## 2015-06-19 DIAGNOSIS — I5033 Acute on chronic diastolic (congestive) heart failure: Secondary | ICD-10-CM

## 2015-06-19 DIAGNOSIS — J449 Chronic obstructive pulmonary disease, unspecified: Secondary | ICD-10-CM | POA: Insufficient documentation

## 2015-06-19 LAB — SODIUM
SODIUM: 129 mmol/L — AB (ref 135–145)
SODIUM: 127 mmol/L — AB (ref 135–145)
Sodium: 129 mmol/L — ABNORMAL LOW (ref 135–145)
Sodium: 129 mmol/L — ABNORMAL LOW (ref 135–145)

## 2015-06-19 LAB — CBC
HCT: 25.9 % — ABNORMAL LOW (ref 35.0–47.0)
Hemoglobin: 8.8 g/dL — ABNORMAL LOW (ref 12.0–16.0)
MCH: 38.9 pg — ABNORMAL HIGH (ref 26.0–34.0)
MCHC: 33.9 g/dL (ref 32.0–36.0)
MCV: 114.7 fL — ABNORMAL HIGH (ref 80.0–100.0)
PLATELETS: 224 10*3/uL (ref 150–440)
RBC: 2.26 MIL/uL — AB (ref 3.80–5.20)
RDW: 19.9 % — ABNORMAL HIGH (ref 11.5–14.5)
WBC: 12.1 10*3/uL — AB (ref 3.6–11.0)

## 2015-06-19 LAB — TSH: TSH: 8.074 u[IU]/mL — ABNORMAL HIGH (ref 0.350–4.500)

## 2015-06-19 LAB — GLUCOSE, CAPILLARY
GLUCOSE-CAPILLARY: 116 mg/dL — AB (ref 65–99)
GLUCOSE-CAPILLARY: 118 mg/dL — AB (ref 65–99)
GLUCOSE-CAPILLARY: 96 mg/dL (ref 65–99)
GLUCOSE-CAPILLARY: 116 mg/dL — AB (ref 65–99)

## 2015-06-19 LAB — CULTURE, BLOOD (ROUTINE X 2)
CULTURE: NO GROWTH
Culture: NO GROWTH

## 2015-06-19 LAB — BASIC METABOLIC PANEL
ANION GAP: 7 (ref 5–15)
BUN: 18 mg/dL (ref 6–20)
CALCIUM: 7.8 mg/dL — AB (ref 8.9–10.3)
CHLORIDE: 100 mmol/L — AB (ref 101–111)
CO2: 19 mmol/L — ABNORMAL LOW (ref 22–32)
CREATININE: 1.18 mg/dL — AB (ref 0.44–1.00)
GFR calc non Af Amer: 53 mL/min — ABNORMAL LOW (ref >60)
Glucose, Bld: 135 mg/dL — ABNORMAL HIGH (ref 65–99)
Potassium: 3.8 mmol/L (ref 3.5–5.1)
SODIUM: 126 mmol/L — AB (ref 135–145)

## 2015-06-19 LAB — MRSA PCR SCREENING: MRSA by PCR: NEGATIVE

## 2015-06-19 LAB — URIC ACID: Uric Acid, Serum: 7.1 mg/dL — ABNORMAL HIGH (ref 2.3–6.6)

## 2015-06-19 LAB — OSMOLALITY: OSMOLALITY: 267 mosm/kg — AB (ref 275–295)

## 2015-06-19 MED ORDER — SODIUM CHLORIDE 0.9 % IV BOLUS (SEPSIS): 500.0000 mL | Freq: Once | INTRAVENOUS | Status: DC

## 2015-06-19 MED ORDER — ENOXAPARIN SODIUM 40 MG/0.4ML ~~LOC~~ SOLN
40.0000 mg | SUBCUTANEOUS | Status: DC
  Administered 2015-06-21 – 2015-06-23 (×3): 40 mg via SUBCUTANEOUS
  Filled 2015-06-19 (×3): qty 0.4

## 2015-06-19 NOTE — Progress Notes (Signed)
[facility] - [place] at [facility]   PATIENT NAME: Erica [name]    MR#:  [id number]  DATE OF BIRTH:  March 16, 1966  SUBJECTIVE:  Over last night had worsening SOB and found increased infiltrate in lung, and leg swelling.   On broad spectrum ABx now. Blood pressure is running on lower side today morning, when nurses tried to make her flat in the bed to help with the blood pressure , she started feeling extremely short of breath with that.   Review of Systems  Constitutional: Negative for fever, chills and weight loss.  HENT: Negative for congestion.   Eyes: Negative for blurred vision and double vision.  Respiratory: Positive for cough and shortness of breath. Negative for sputum production and wheezing.   Cardiovascular: Positive for leg swelling. Negative for chest pain, palpitations, orthopnea and PND.  Gastrointestinal: Negative for nausea, vomiting, abdominal pain, diarrhea, constipation and blood in stool.  Genitourinary: Negative for dysuria, urgency, frequency and hematuria.  Musculoskeletal: Negative for falls.  Neurological: Negative for dizziness, tremors, focal weakness and headaches.  Endo/Heme/Allergies: Does not bruise/bleed easily.  Psychiatric/Behavioral: Negative for depression. The patient does not have insomnia.     VITAL SIGNS: Blood pressure 101/55, pulse 80, temperature 96.8 F (36 C), temperature source Oral, resp. rate 22, height 5\' 8"  (1.727 m), weight 104.4 kg (230 lb 2.6 oz), last menstrual period 03/13/2015, SpO2 100 %.  PHYSICAL EXAMINATION:   GENERAL:  49 y.o.-year-old patient lying in the bed no acute distress  EYES: Pupils equal, round, reactive to light and accommodation. No scleral icterus. Extraocular muscles intact.  HEENT: Head atraumatic, normocephalic. Oropharynx and nasopharynx clear.  NECK:  Supple, no jugular venous distention. No thyroid enlargement, no tenderness. LUNGS:  no wheezing this am some crackles  present. CARDIOVASCULAR: S1, S2 normal. No murmurs, rubs, or gallops.  ABDOMEN: Soft, nontender, nondistended. Bowel sounds present. No organomegaly or mass.  EXTREMITIES: 1+ lower extremity and pedal edema, no cyanosis, or clubbing. Dorsalis pedis pulses are palpable at 1+ NEUROLOGIC: Cranial nerves II through XII are intact. Muscle strength 5/5 in all extremities. Sensation intact. Gait not checked.  PSYCHIATRIC: The patient is alert and oriented x 3. anxious SKIN: No obvious rash, lesion, or ulcer.   ORDERS/RESULTS REVIEWED:   CBC  Recent Labs Lab 06/14/15 0249 06/15/15 0520 06/16/15 0655 06/18/15 0550 06/18/15 1012 06/19/15 0600  WBC 11.9* 12.2* 11.5* 10.4  --  12.1*  HGB 10.5* 10.1* 9.0* 7.5* 9.2* 8.8*  HCT 29.7* 29.6* 26.9* 22.0* 27.6* 25.9*  PLT 260 260 244 199  --  224  MCV 110.4* 111.6* 112.7* 115.6*  --  114.7*  MCH 39.1* 37.9* 37.9* 39.1*  --  38.9*  MCHC 35.4 34.0 33.7 33.9  --  33.9  RDW 19.5* 19.6* 19.9* 20.7*  --  19.9*   ------------------------------------------------------------------------------------------------------------------  Chemistries   Recent Labs Lab 06/14/15 2042  06/16/15 1909  06/17/15 0450 06/17/15 0800  06/17/15 1229  06/18/15 0550  06/18/15 1818 06/18/15 2130 06/19/15 0140 06/19/15 0600 06/19/15 1112  NA 118*  < > 126*  < > 128* 145  < > 131*  < > 134*  < > 131* 129* 129* 126* 129*  K 4.3  < > 5.1  --  4.2 4.1  --   --   --  2.8*  --   --   --   --  3.8  --   CL 87*  < > 101  --  102 122*  --   --   --  117*  --   --   --   --  100*  --   CO2 16*  < > 17*  --  16* 14*  --   --   --  13*  --   --   --   --  19*  --   GLUCOSE 195*  < > 135*  --  126* 124*  --   --   --  98  --   --   --   --  135*  --   BUN 20  < > 19  --  19 19  --   --   --  16  --   --   --   --  18  --   CREATININE 1.06*  < > 1.15*  --  1.17* 1.06*  --   --   --  0.83  --   --   --   --  1.18*  --   CALCIUM 8.0*  < > 8.0*  --  7.9* 7.3*  --   --   --  5.7*  --    --   --   --  7.8*  --   MG  --   --   --   --   --   --   --  2.0  --   --   --   --   --   --   --   --   AST 51*  --   --   --   --   --   --   --   --   --   --   --   --   --   --   --   ALT 15  --   --   --   --   --   --   --   --   --   --   --   --   --   --   --   ALKPHOS 68  --   --   --   --   --   --   --   --   --   --   --   --   --   --   --   BILITOT 1.8*  --   --   --   --   --   --   --   --   --   --   --   --   --   --   --   < > = values in this interval not displayed. ------------------------------------------------------------------------------------------------------------------ estimated creatinine clearance is 72.1 mL/min (by C-G formula based on Cr of 1.18). ------------------------------------------------------------------------------------------------------------------ No results for input(s): TSH, T4TOTAL, T3FREE, THYROIDAB in the last 72 hours.  Invalid input(s): FREET3  Cardiac Enzymes  Recent Labs Lab 06/14/15 0249 06/14/15 0800 06/14/15 1358  TROPONINI <0.03 <0.03 <0.03   ------------------------------------------------------------------------------------------------------------------ Invalid input(s): POCBNP ---------------------------------------------------------------------------------------------------------------  RADIOLOGY: Dg Chest 1 View  06/18/2015  CLINICAL DATA:  Dyspnea EXAM: CHEST 1 VIEW COMPARISON:  06/15/2015 FINDINGS: There is a satisfactorily positioned right upper extremity PICC line. There is worsening confluent airspace opacity in the central and basilar left lung. No large effusion. Right lung is clear. No pneumothorax. IMPRESSION: Worsening left lung airspace opacity. This could represent pneumonia. Asymmetric alveolar edema is less likely. Aspiration not excluded. Electronically Signed   By: Ellery Plunkaniel R Mitchell M.D.   On:  06/18/2015 02:33   Ct Chest Wo Contrast  06/19/2015  CLINICAL DATA:  Patient with diffuse crackles  throughout the lungs bilaterally. Upper extremity edema. Hypotension. EXAM: CT CHEST WITHOUT CONTRAST TECHNIQUE: Multidetector CT imaging of the chest was performed following the standard protocol without IV contrast. COMPARISON:  Chest CT 06/14/2015 FINDINGS: Mediastinum/Nodes: Right upper extremity PICC line is present with tip terminating at the superior cavoatrial junction. Visualized thyroid is unremarkable. 12 mm right peritracheal lymph node (image 20; series 2). Prominent pericardial phrenic lymph nodes measuring up to 11 mm (image 41; series 2). Prominent subcarinal lymph node measuring 14 mm (image 28; series 2). Normal heart size. Trace pericardial fluid. Low density of the blood pool. Small hiatal hernia. Lungs/Pleura: Central airways are patent. There is a 5 mm right upper lobe pulmonary nodule (image 20; series 3). Additionally there is a 6 mm right middle lobe pulmonary nodule (image 69; series 5). Grossly stable 5 mm left upper lobe pulmonary nodule (image 21; series 3). Interval development of ground-glass pulmonary opacities predominantly involving the left upper lobe, lingula and left lower lobe with smaller focal areas involving the right upper, right middle and right lower lobes, predominantly centrally. There is associated smooth interlobular septal thickening. Probable dependent atelectasis within right lower lobe. No large pleural effusion or pneumothorax. Upper abdomen: Small amount of perihepatic ascites. Liver is diffusely low in attenuation compatible with steatosis. Pancreatic parenchymal atrophy. Musculoskeletal: Anasarca and soft tissue edema overlying the right lower lateral chest wall. No aggressive or acute appearing osseous lesions. IMPRESSION: Interval development of left-greater-than-right ground-glass opacities with smooth interlobular septal thickening (crazy paving). Findings are nonspecific however may be secondary to edema, infection, hemorrhage or potentially respiratory  distress syndrome (ARDS). Scattered pulmonary nodules. Attention on below recommended follow-up chest CT is recommended. Hepatic steatosis. New small amount of perihepatic ascites. Mildly enlarged mediastinal lymph nodes. These are nonspecific however likely reactive in etiology. Given the multitude of findings, recommend short-term radiographic follow-up. Additionally follow-up chest CT is recommended in 6- 8 weeks to assess for interval resolution and/or improvement. Electronically Signed   By: Annia Belt M.D.   On: 06/19/2015 15:33   US Venous Img Upper Uni Right  06/19/2015  CLINICAL DATA:  Right upper extremity swelling for 5 days. EXAM: RIGHT UPPER EXTREMITY VENOUS DOPPLER ULTRASOUND TECHNIQUE: Gray-scale sonography with graded compression, as well as color Doppler and duplex ultrasound were performed to evaluate the upper extremity deep venous system from the level of the subclavian vein and including the jugular, axillary, basilic, radial, ulnar and upper cephalic vein. Spectral Doppler was utilized to evaluate flow at rest and with distal augmentation maneuvers. COMPARISON:  None. FINDINGS: Contralateral Subclavian Vein: Respiratory phasicity is normal and symmetric with the symptomatic side. No evidence of thrombus. Normal compressibility. Internal Jugular Vein: No evidence of thrombus. Normal compressibility, respiratory phasicity and response to augmentation. Subclavian Vein: No evidence of thrombus. Normal compressibility, respiratory phasicity and response to augmentation. Axillary Vein: No evidence of thrombus. Normal compressibility, respiratory phasicity and response to augmentation. Cephalic Vein: Nonocclusive hypoechoic intraluminal thrombus in the right cephalic vein compatible with superficial thrombosis. This does not propagate into the deep veins of the right upper extremity. Vessel remains compressible. Preserved phasic flow. Normal augmentation. Basilic Vein: No evidence of thrombus.  Normal compressibility, respiratory phasicity and response to augmentation. Brachial Veins: No evidence of thrombus. Normal compressibility, respiratory phasicity and response to augmentation. Radial Veins: No evidence of thrombus. Normal compressibility, respiratory phasicity and response to augmentation. Ulnar Veins:  No evidence of thrombus. Normal compressibility, respiratory phasicity and response to augmentation. Venous Reflux:  None visualized. Other Findings:  Subcutaneous edema evident. IMPRESSION: No significant right upper extremity DVT Nonocclusive superficial thrombus in the right cephalic vein. Electronically Signed   By: Judie Petit.  Shick M.D.   On: 06/19/2015 15:04    ASSESSMENT AND PLAN:  1. Acute respiratory distress due to COPD exacerbation: And worsening pneumonia   Had worsening on 06/17/15 night=- found worsening in infiltrate.  since 06/18/15 on broad spectrum ABx.   As blood pressure started going down, held her Lasix and spoke to pulmonologist.   Repeat chest CT today  2. COPD exacerbation:  Broad spectrum ABx and and DuoNeb's   3. Acute on chronic diastolic heart failure: 2-D echocardiogram showed elevated right heart pressure likely secondary to underlying lung disease with diastolic dysfunction.  discontinue Nitropaste.   Slightly worsening- held his Lasix due to lower blood pressures.   Cardiology on case.   Planning to transfer her to ICU and possibly starting on dopamine and Lasix drip.  4. Hyponatremia: Etiology of hyponatremia is unknown at this time. Her urine sodium was less than 10. Nephrology is following.  Continue monitoring sodium and given hypertonic solution.  Also given bicarbonate drip, still sodium level dropped further.  Nephrologist thinking to start him tolvaptan.  5. Renal insufficiency,   unremarkable renal ultrasound, appreciated nephrology consult.  Slight worsening. Continue monitoring  6. Acute cystitis: Continue Levaquin no urine culture was  performed.  On broad-spectrum antibiotics now.  7. Elevated troponin, likely demand ischemia, echocardiogram showed normal ejection fraction, however, elevated central venous as well as pulmonary artery pressures, now initiated on diuretic 8. Hyperglycemia. hemoglobin A1c 6.2 9. Tobacco abuse, Nicotine replacement therapy is initiated 10. Lower back pain: Continue when necessary pain meds  11. C/o abdominal discomfort after eating, and Hx of peptic u.cer disease.    Called GI consult.  Management plans discussed with the patient and she is in agreement.   DRUG ALLERGIES:  Allergies  Allergen Reactions  . Tramadol Other (See Comments)    Heart palpatations  . Penicillins Rash    CODE STATUS:     Code Status Orders        Start     Ordered   06/14/15 0202  Full code   Continuous     06/14/15 0201    Advance Directive Documentation        Most Recent Value   Type of Advance Directive  Living will   Pre-existing out of facility DNR order (yellow form or pink MOST form)     "MOST" Form in Place?       Condition critical due to worsening respi status, fluid overload and hyponatremia.  Also have some hypotension. We'll monitor in stepdown unit. TOTAL CRITICAL CARE  TIME TAKING CARE OF THIS PATIENT: 45 minutes.   Discussed with nephrology , pulmonology and cardiology team. Altamese Dilling M.D on 06/19/2015 at 5:45 PM  Between 7am to 6pm - Pager - [phone]  After 6pm go to [email or web address] - password [email or web address] [facility]  [place]  [facility]  Office  [phone]  CC: Primary care physician; [facility]

## 2015-06-19 NOTE — Progress Notes (Addendum)
Paged MD. Pt with diffuse crackles to bilateral lung fields. Hypotensive. RUE edema +2. New orders for CT, RUE Doppler.Per MD, Ok to transfer pt for CT with current BP. Per MD, cardiology plans to transfer pt to CCU for further mgmt.

## 2015-06-19 NOTE — Progress Notes (Signed)
Pt reported SOB at rest. Increased effort w/resp, coarse crackles to bilateral lung fields auscultated. POX at 84%, Applied O2 at 2L via N/C,  repositioned pt, deep breathe/cough, improved to 88%. Pt continued deep breathing exercises until POX @ 93%.  Pt transferred on 2L via N/C to radiology for chest CT and U/S of RUE.

## 2015-06-19 NOTE — Progress Notes (Signed)
Patient: Erica [name] / Admit Date: 06/13/2015 / Date of Encounter: 06/19/2015, 11:07 AM   Subjective: SOB. Edema is worsening. Hyponatremia remains.   Review of Systems: Review of Systems  Constitutional: Positive for malaise/fatigue. Negative for fever, chills, weight loss and diaphoresis.  HENT: Negative for congestion.   Eyes: Negative for discharge and redness.  Respiratory: Positive for cough, shortness of breath and wheezing. Negative for hemoptysis and sputum production.   Cardiovascular: Positive for orthopnea, leg swelling and PND. Negative for chest pain, palpitations and claudication.  Gastrointestinal: Negative for nausea and vomiting.  Musculoskeletal: Negative for falls.  Skin: Negative for rash.  Neurological: Positive for weakness. Negative for dizziness, sensory change, speech change, focal weakness and loss of consciousness.  Endo/Heme/Allergies: Does not bruise/bleed easily.  Psychiatric/Behavioral: The patient is nervous/anxious.      Objective: Telemetry: NSR, 80's Physical Exam: Blood pressure 92/54, pulse 83, temperature 98 F (36.7 C), temperature source Oral, resp. rate 16, height  (1.727 m), weight 218 lb 8 oz (99.111 kg), last menstrual period 03/13/2015, SpO2 93 %. Body mass index is 33.23 kg/(m^2). General: Well developed, well nourished, in no acute distress. Head: Normocephalic, atraumatic, sclera non-icteric, no xanthomas, nares are without discharge. Neck: Negative for carotid bruits. JVP not elevated. Lungs: Diffuse crackles, worse than prior. Breathing is mildly labored. Heart: RRR S1 S2 without murmurs, rubs, or gallops.  Abdomen: Soft, non-tender, non-distended with normoactive bowel sounds. No rebound/guarding. Extremities: No clubbing or cyanosis. 2+ pitting edema to the bilateral mid-thighs. Distal pedal pulses are 2+ and equal bilaterally. Neuro: Alert and oriented X 3. Moves all extremities spontaneously. Psych:  Responds to  questions appropriately with a normal affect.   Intake/Output Summary (Last 24 hours) at 06/19/15 1107 Last data filed at 06/19/15 0557  Gross per 24 hour  Intake 1763.33 ml  Output   1000 ml  Net 763.33 ml    Inpatient Medications:  . arformoterol  15 mcg Nebulization BID  . aspirin EC  81 mg Oral Daily  . atorvastatin  20 mg Oral q1800  . enoxaparin (LOVENOX) injection  40 mg Subcutaneous Q24H  . insulin aspart  0-9 Units Subcutaneous TID AC & HS  . lidocaine  1 patch Transdermal Q24H  . magic mouthwash  10 mL Oral TID  . meropenem (MERREM) IV  1 g Intravenous 3 times per day  . nicotine  21 mg Transdermal Daily  . pantoprazole  40 mg Oral Daily  . potassium chloride  20 mEq Oral BID  . sodium chloride  500 mL Intravenous Once  . sucralfate  1 g Oral TID WC & HS  . vancomycin  1,250 mg Intravenous Q12H   Infusions:    Labs:  Recent Labs  06/17/15 1229  06/18/15 0550  06/19/15 0140 06/19/15 0600  NA 131*  < > 134*  < > 129* 126*  K  --   --  2.8*  --   --  3.8  CL  --   --  117*  --   --  100*  CO2  --   --  13*  --   --  19*  GLUCOSE  --   --  98  --   --  135*  BUN  --   --  16  --   --  18  CREATININE  --   --  0.83  --   --  1.18*  CALCIUM  --   --  5.7*  --   --  7.8*  MG 2.0  --   --   --   --   --   < > = values in this interval not displayed. No results for input(s): AST, ALT, ALKPHOS, BILITOT, PROT, ALBUMIN in the last 72 hours.  Recent Labs  06/18/15 0550 06/18/15 1012 06/19/15 0600  WBC 10.4  --  12.1*  HGB 7.5* 9.2* 8.8*  HCT 22.0* 27.6* 25.9*  MCV 115.6*  --  114.7*  PLT 199  --  224   No results for input(s): CKTOTAL, CKMB, TROPONINI in the last 72 hours. Invalid input(s): POCBNP No results for input(s): HGBA1C in the last 72 hours.   Weights: Filed Weights   06/17/15 0500 06/18/15 0500 06/19/15 0232  Weight: 211 lb 3.9 oz (95.82 kg) 215 lb 13.6 oz (97.909 kg) 218 lb 8 oz (99.111 kg)     Radiology/Studies:  Dg Chest 1  View  06/18/2015  CLINICAL DATA:  Dyspnea EXAM: CHEST 1 VIEW COMPARISON:  06/15/2015 FINDINGS: There is a satisfactorily positioned right upper extremity PICC line. There is worsening confluent airspace opacity in the central and basilar left lung. No large effusion. Right lung is clear. No pneumothorax. IMPRESSION: Worsening left lung airspace opacity. This could represent pneumonia. Asymmetric alveolar edema is less likely. Aspiration not excluded. Electronically Signed   By: Ellery Plunk M.D.   On: 06/18/2015 02:33   Dg Chest 2 View  06/13/2015  CLINICAL DATA:  Shortness of breath. Asthma. COPD. Swelling of the feet and legs. EXAM: CHEST  2 VIEW COMPARISON:  03/23/2013 FINDINGS: Indistinct pulmonary vasculature, with slight interstitial accentuation in the lung bases. Is is heart size within normal limits. No pleural effusion. Mild bilateral airway thickening. IMPRESSION: 1. Airway thickening is present, suggesting bronchitis or reactive airways disease. There is faint associated interstitial accentuation in the lung bases. No overt airspace opacity. Electronically Signed   By: Gaylyn Rong M.D.   On: 06/13/2015 22:34   Ct Angio Chest Pe W/cm &/or Wo Cm  06/14/2015  CLINICAL DATA:  Short of breath and chest tightness. Chest pain for several weeks with productive cough. Concern pulmonary embolism. Smoking history. EXAM: CT ANGIOGRAPHY CHEST WITH CONTRAST TECHNIQUE: Multidetector CT imaging of the chest was performed using the standard protocol during bolus administration of intravenous contrast. Multiplanar CT image reconstructions and MIPs were obtained to evaluate the vascular anatomy. CONTRAST:  75mL OMNIPAQUE IOHEXOL 350 MG/ML SOLN COMPARISON:  CT 03/23/2013 FINDINGS: Mediastinum/Nodes: No filling defects within the pulmonary suggest acute pulmonary embolism. No acute findings aorta great vessels. Mildly enlarged paratracheal lymph nodes up to 12 mm. Subcarinal lymph node similar 13 mm.  There is mild bilateral hilar lymphadenopathy. No pericardial fluid.  Esophagus is normal. Lungs/Pleura: Small nodule in the RIGHT upper lobe measures 5 mm on image 62, series 6. nodule appears slightly increased from 3 mm on prior. 3 mm RIGHT upper lobe nodule on image 67, series 6 is increased from 2 mm on prior. There is peripheral ground-glass opacity in the LEFT upper lobe (image 43 through 59). Upper abdomen: Limited view of the liver, kidneys, pancreas are unremarkable. Normal adrenal glands. Musculoskeletal: No acute osseous abnormality. Review of the MIP images confirms the above findings. IMPRESSION: 1. No evidence acute pulmonary embolism. 2. Ground-glass opacities in the periphery of the LEFT upper lobe suggest mild pulmonary infection. 3. Mild mediastinal and hilar lymphadenopathy is likely reactive. 4. Interval enlargement of 2 small RIGHT upper lobe pulmonary nodules. 5. With the findings of infection, adenopathy, and  potentially enlarging nodules coupled with smoking history, recommend follow-up CT without contrast in 3 months to re-evaluate. Electronically Signed   By: Genevive BiStewart  Edmunds M.D.   On: 06/14/2015 11:39   [place] Renal  06/14/2015  CLINICAL DATA:  Renal insufficiency EXAM: RENAL / URINARY TRACT ULTRASOUND COMPLETE COMPARISON:  None. FINDINGS: Right Kidney: Length: 11.8 cm. Echogenicity within normal limits. No mass or hydronephrosis visualized. Left Kidney: Length: 12.2 cm. Echogenicity within normal limits. No mass or hydronephrosis visualized. Bladder: Appears normal for degree of bladder distention. IMPRESSION: No hydronephrosis or diagnostic renal calculus. Suboptimal exam due to patient's large body habitus. Electronically Signed   By: Natasha MeadLiviu  Pop M.D.   On: 06/14/2015 14:26   [place] Venous Img Lower Bilateral  06/14/2015  CLINICAL DATA:  Bilateral leg swelling EXAM: BILATERAL LOWER EXTREMITY VENOUS DOPPLER ULTRASOUND TECHNIQUE: Gray-scale sonography with graded compression, as well as  color Doppler and duplex ultrasound were performed to evaluate the lower extremity deep venous systems from the level of the common femoral vein and including the common femoral, femoral, profunda femoral, popliteal and calf veins including the posterior tibial, peroneal and gastrocnemius veins when visible. The superficial great saphenous vein was also interrogated. Spectral Doppler was utilized to evaluate flow at rest and with distal augmentation maneuvers in the common femoral, femoral and popliteal veins. COMPARISON:  None. FINDINGS: RIGHT LOWER EXTREMITY Common Femoral Vein: No evidence of thrombus. Normal compressibility, respiratory phasicity and response to augmentation. Saphenofemoral Junction: No evidence of thrombus. Normal compressibility and flow on color Doppler imaging. Profunda Femoral Vein: No evidence of thrombus. Normal compressibility and flow on color Doppler imaging. Femoral Vein: No evidence of thrombus. Normal compressibility, respiratory phasicity and response to augmentation. Popliteal Vein: No evidence of thrombus. Normal compressibility, respiratory phasicity and response to augmentation. Calf Veins: No evidence of thrombus. Normal compressibility and flow on color Doppler imaging. Superficial Great Saphenous Vein: No evidence of thrombus. Normal compressibility and flow on color Doppler imaging. Venous Reflux:  None. Other Findings:  None. LEFT LOWER EXTREMITY Common Femoral Vein: No evidence of thrombus. Normal compressibility, respiratory phasicity and response to augmentation. Saphenofemoral Junction: No evidence of thrombus. Normal compressibility and flow on color Doppler imaging. Profunda Femoral Vein: No evidence of thrombus. Normal compressibility and flow on color Doppler imaging. Femoral Vein: No evidence of thrombus. Normal compressibility, respiratory phasicity and response to augmentation. Popliteal Vein: No evidence of thrombus. Normal compressibility, respiratory  phasicity and response to augmentation. Calf Veins: No evidence of thrombus. Normal compressibility and flow on color Doppler imaging. Superficial Great Saphenous Vein: No evidence of thrombus. Normal compressibility and flow on color Doppler imaging. Venous Reflux:  None. Other Findings: There is a left inguinal lymph node measures 2.8 x 1.9 cm. IMPRESSION: No evidence of deep venous thrombosis bilateral lower extremity. Left inguinal lymph node measures 2.8 x 1.9 cm. Electronically Signed   By: Natasha MeadLiviu  Pop M.D.   On: 06/14/2015 14:31   Dg Chest Port 1 View  06/15/2015  CLINICAL DATA:  Right-sided PICC line placement. EXAM: PORTABLE CHEST 1 VIEW COMPARISON:  06/13/2015 FINDINGS: Interval placement of right-sided PICC line with tip overlying the distal SVC just above the cavoatrial junction. Lungs are adequately inflated with slight rounding hazy left infrahilar opacification. No evidence of effusion. Cardiomediastinal silhouette and remainder the exam is unchanged. IMPRESSION: Slight worsening hazy left infrahilar opacification which may be due to infection. Right-sided PICC line with tip over the distal SVC. Electronically Signed   By: Elberta Fortisaniel  Boyle M.D.  On: 06/15/2015 12:23     Assessment and Plan   1. Respiratory distress/shortness of breath: -Worsening -Patient would benefit from transfer to CCU for initiation of Dopamine and Lasix gtt given her hypotension and worsening edema -Upper respiratory infection/PNA seen on CXR 11/30 on ABX per IM -Acute on chronic diastolic CHF, pulmonary hypertension -Moderately elevated right heart pressures seen on echocardiogram, RVSP 56 -Agree with usage of tolvaptan, though bp currently precludes this usage   2. Acute on chronic diastolic CHF: -Echo showing Elevated right heart pressures likely secondary to underlying lung disease, diastolic dysfunction -She received Lasix x 3 over the past 24 hours, perhaps leading to persisting hypotension in the setting  of the discontinuation of the nitro paste along with her hyponatremia   3. COPD exacerbation: -Improving -Managed by pulmonary -Antibiotic, nebulizer  4. Hyponatremia: -Persisting  -Tolvaptan when able as above  5. Leg edema: -Worsening   6. Thrush: -Long history of thrush over the past several months, previously treated with swish and swallow as well as nystatin got better but then came back -May have started with steroid inhaler,QVAR -Will defer management to medical team  7. Chronic back pain: -Per IM/PCP  8. Anxiety: -Per IM  SignedEula Listen, PA-C Pager: [phone] 06/19/2015, 11:07 AM

## 2015-06-19 NOTE — Progress Notes (Addendum)
  IMPRESSION:  Acute respiratory failure better, the patient has dyspnea, and has crackles on lung fields. -Review of her chest x-ray from 06/19/2015, report and images, shows new left-sided infiltrate, likely effusion versus atelectasis.  Pneumonia -Patient has completed a course of antibiotics for pneumonia, she continues to have some bilateral infiltrates on her CAT scan which will likely persist. --Ct chest images from today reviewed: new left lung interstitial infiltrates, could be pulmonary edema vs. Infection. Will plan for bronchoscopy tomorrow (pt has already eaten today).   Lung nodules. -The patient has lung nodule seen on most recent CAT scan, versus masslike infiltrate. Will need a repeat outpatient CAT scan.  Nicotine abuse. -Heavy smoker, discussed smoking cessation.  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  Thrush. -Possibly due to COPD inhalers.  Hypotension. -Fluid bolus, pressors if needed. Discussed with hospitalist physician, patient will be transferred to the intensive care unit for close monitoring.   Subjective: Pt still feels dyspneic. Currently oxygen saturation is 90% on room air.  Filed Vitals:   06/19/15 0826 06/19/15 0833 06/19/15 1045 06/19/15 1131  BP:  90/52 92/54 101/50  Pulse:    84  Temp:    97.6 F (36.4 C)  TempSrc:    Oral  Resp:    20  Height:      Weight:      SpO2: 93%   93%   Appears chronically ill No overt distress No JVD noted Bibasilar scattered crackles, no wheezes RRR s M NABS Trace symmetric LE edema  BMP Latest Ref Rng 06/19/2015 06/19/2015 06/19/2015  Glucose 65 - 99 mg/dL - 161(W135(H) -  BUN 6 - 20 mg/dL - 18 -  Creatinine 9.600.44 - 1.00 mg/dL - 4.54(U1.18(H) -  Sodium 981135 - 145 mmol/L 129(L) 126(L) 129(L)  Potassium 3.5 - 5.1 mmol/L - 3.8 -  Chloride 101 - 111 mmol/L - 100(L) -  CO2 22 - 32 mmol/L - 19(L) -  Calcium 8.9 - 10.3 mg/dL - 7.8(L) -    CBC Latest Ref Rng 06/19/2015 06/18/2015 06/18/2015  WBC  3.6 - 11.0 K/uL 12.1(H) - 10.4  Hemoglobin 12.0 - 16.0 g/dL 1.9(J8.8(L) 4.7(W9.2(L) 7.5(L)  Hematocrit 35.0 - 47.0 % 25.9(L) 27.6(L) 22.0(L)  Platelets 150 - 440 K/uL 224 - 199        The patient has an outpatient appointment at [facility] on 07-04-15 @ 9:30am.   Wells Guileseep Rayshawn Maney, M.D.  Critical Care Attestation.  I have personally obtained a history, examined the patient, evaluated laboratory and imaging results, formulated the assessment and plan and placed orders. The Patient requires high complexity decision making for assessment and support, frequent evaluation and titration of therapies, application of advanced monitoring technologies and extensive interpretation of multiple databases. The patient has critical illness that could lead imminently to failure of 1 or more organ systems and requires the highest level of physician preparedness to intervene.  Critical Care Time devoted to patient care services described in this note is 35 minutes and is exclusive of time spent in procedures.

## 2015-06-19 NOTE — Progress Notes (Signed)
ANTIBIOTIC CONSULT NOTE - Follow Up  Pharmacy Consult for vancomycin and meropenem dosing Indication: HCAP  Allergies  Allergen Reactions  . Tramadol Other (See Comments)    Heart palpatations  . Penicillins Rash    Patient Measurements: Height: 5\' 8"  (172.7 cm) Weight: 218 lb 8 oz (99.111 kg) IBW/kg (Calculated) : 63.9 Adjusted Body Weight: 78 kg  Vital Signs: Temp: 97.6 F (36.4 C) (12/01 1131) Temp Source: Oral (12/01 1131) BP: 101/50 mmHg (12/01 1131) Pulse Rate: 84 (12/01 1131) Intake/Output from previous day: 11/30 0701 - 12/01 0700 In: 1977.5 [P.O.:120; I.V.:957.5; IV Piggyback:900] Out: 1400 [Urine:1400] Intake/Output from this shift:    Labs:  Recent Labs  06/17/15 0800 06/18/15 0550 06/18/15 1012 06/19/15 0600  WBC  --  10.4  --  12.1*  HGB  --  7.5* 9.2* 8.8*  PLT  --  199  --  224  CREATININE 1.06* 0.83  --  1.18*   Estimated Creatinine Clearance: 70.2 mL/min (by C-G formula based on Cr of 1.18). No results for input(s): VANCOTROUGH, VANCOPEAK, VANCORANDOM, GENTTROUGH, GENTPEAK, GENTRANDOM, TOBRATROUGH, TOBRAPEAK, TOBRARND, AMIKACINPEAK, AMIKACINTROU, AMIKACIN in the last 72 hours.   Microbiology: Recent Results (from the past 720 hour(s))  MRSA PCR Screening     Status: None   Collection Time: 06/14/15  2:01 AM  Result Value Ref Range Status   MRSA by PCR NEGATIVE NEGATIVE Final    Comment:        The GeneXpert MRSA Assay (FDA approved for NASAL specimens only), is one component of a comprehensive MRSA colonization surveillance program. It is not intended to diagnose MRSA infection nor to guide or monitor treatment for MRSA infections.   Culture, blood (routine x 2)     Status: None   Collection Time: 06/14/15  8:56 AM  Result Value Ref Range Status   Specimen Description BLOOD  BLD RIGHT ARM  Final   Special Requests   Final    BOTTLES DRAWN AEROBIC AND ANAEROBIC  7CC AERO, 4CC ANAERO   Culture NO GROWTH 5 DAYS  Final   Report  Status 06/19/2015 FINAL  Final  Culture, blood (routine x 2)     Status: None   Collection Time: 06/14/15  9:04 AM  Result Value Ref Range Status   Specimen Description BLOOD RIGHT ASSIST CONTROL  Final   Special Requests   Final    BOTTLES DRAWN AEROBIC AND ANAEROBIC  4CC AERO, 8CC ANAERO   Culture NO GROWTH 5 DAYS  Final   Report Status 06/19/2015 FINAL  Final    Medical History: Past Medical History  Diagnosis Date  . Arthritis   . COPD (chronic obstructive pulmonary disease) (HCC)   . Gastric ulcer   . OSA on CPAP no ins - no cpap machine for the last 4 years  . Asthma   . Hypertension     Medications:  Vancomycin 1250 mg IV q12h Meropenem 1 gm IV q8h  Assessment: Pharmacy consulted to dose Vancomycin and Meropenem for empiric coverage of HCAP in a 49 yo female.   Blood cx NGTD CXR: worsening left lung opacity  SCr: 1.18, est CrCl~70.2 mL/min, ke: 0.063, t1/2: 11h, Vd: 54.6 L   Goal of Therapy: Resolve infection Vancomycin trough 15-20.   Plan:  Patient with increase in SCr from 0.83 to 1.18.  Of note, patient received furosemide 20 mg IV q8h x 3 doses.  Ordered trough for 1230 today before dose but lab was not drawn and dose was administered at  1240 (RPh left MAR note to RN to please hold dose until trough was drawn).  Will reschedule trough for this evening at 2230.  Using current parameters, estimated trough is ~20.  Therefore, dose decrease may be warranted.  Pharmacy will continue to follow.   Jacoya Bauman G 06/19/2015,2:15 PM

## 2015-06-19 NOTE — Progress Notes (Signed)
Per Dr. Elisabeth PigeonVachhani Cardiologist will put transfer order into computer.  Until this send patient for CT & US.  Ok to send for tests without nurse at bedside.

## 2015-06-19 NOTE — Progress Notes (Signed)
Patient seen in CT and Ultrasound prior to transferring to ICU per MD orders.  Transferred care over to nurse ICU 13

## 2015-06-19 NOTE — Progress Notes (Signed)
Pt continues with Low BP. Paged MD. D/C order for 500mL bolus (not administered). Dr. Cherylann RatelLateef to advise. New orders per Dr. Desiree HaneVachani to follow.

## 2015-06-19 NOTE — Progress Notes (Addendum)
BP low this AM at 88/44 manual. Pt unable to tolerate flat position d/t hx of apnea and drowsiness. Elevated feet, paged MD. No new orders at this time, MD to assess this AM. Will continue to monitor.

## 2015-06-19 NOTE — Consult Note (Signed)
GI was consulted due to poor appetite.  She has hx of multiple gastric ulcers in the past and was started on empiric treatment with carafate and PPI.  Likely much of her appetite decrease is due to the wide fluctuations in serum electrolytes.  No new suggestions.

## 2015-06-19 NOTE — Progress Notes (Signed)
[facility]  ROUNDING NOTE   Subjective:  After discontinuation of 3% saline serum sodium is dropping. Currently sodium is 126. Serum bicarbonate has improved to 19. Blood pressure also noted as being low at 92/54.  Objective:  Vital signs in last 24 hours:  Temp:  [97.6 F (36.4 C)-98.2 F (36.8 C)] 98 F (36.7 C) (12/01 0728) Pulse Rate:  [83-91] 83 (12/01 0728) Resp:  [16-20] 16 (12/01 0728) BP: (87-102)/(37-58) 92/54 mmHg (12/01 1045) SpO2:  [91 %-97 %] 93 % (12/01 0826) Weight:  [99.111 kg (218 lb 8 oz)] 99.111 kg (218 lb 8 oz) (12/01 0232)  Weight change: 1.202 kg (2 lb 10.4 oz) Filed Weights   06/17/15 0500 06/18/15 0500 06/19/15 0232  Weight: 95.82 kg (211 lb 3.9 oz) 97.909 kg (215 lb 13.6 oz) 99.111 kg (218 lb 8 oz)    Intake/Output: I/O last 3 completed shifts: In: 3942 [P.O.:120; I.V.:2922; IV Piggyback:900] Out: 2300 [Urine:2300]   Intake/Output this shift:     Physical Exam: General: NAD, resting in bed   Head: Normocephalic, atraumatic. Moist oral mucosal membranes  Eyes: Anicteric  Neck: Supple, trachea midline  Lungs:   basilar rales, normal effort   Heart: Regular rate and rhythm  Abdomen:  Soft, nontender, bowel sounds present   Extremities:  trace peripheral edema.  Neurologic: Nonfocal, moving all four extremities  Skin: No lesions       Basic Metabolic Panel:  Recent Labs Lab 06/16/15 1909  06/17/15 0450 06/17/15 0800  06/17/15 1229  06/18/15 0550 06/18/15 0902 06/18/15 1818 06/18/15 2130 06/19/15 0140 06/19/15 0600  NA 126*  < > 128* 145  < > 131*  < > 134* 112* 131* 129* 129* 126*  K 5.1  --  4.2 4.1  --   --   --  2.8*  --   --   --   --  3.8  CL 101  --  102 122*  --   --   --  117*  --   --   --   --  100*  CO2 17*  --  16* 14*  --   --   --  13*  --   --   --   --  19*  GLUCOSE 135*  --  126* 124*  --   --   --  98  --   --   --   --  135*  BUN 19  --  19 19  --   --   --  16  --   --   --   --  18   CREATININE 1.15*  --  1.17* 1.06*  --   --   --  0.83  --   --   --   --  1.18*  CALCIUM 8.0*  --  7.9* 7.3*  --   --   --  5.7*  --   --   --   --  7.8*  MG  --   --   --   --   --  2.0  --   --   --   --   --   --   --   < > = values in this interval not displayed.  Liver Function Tests:  Recent Labs Lab 06/14/15 2042  AST 51*  ALT 15  ALKPHOS 68  BILITOT 1.8*  PROT 6.6  ALBUMIN 2.9*   No results for input(s): LIPASE, AMYLASE in the  last 168 hours. No results for input(s): AMMONIA in the last 168 hours.  CBC:  Recent Labs Lab 06/14/15 0249 06/15/15 0520 06/16/15 0655 06/18/15 0550 06/18/15 1012 06/19/15 0600  WBC 11.9* 12.2* 11.5* 10.4  --  12.1*  HGB 10.5* 10.1* 9.0* 7.5* 9.2* 8.8*  HCT 29.7* 29.6* 26.9* 22.0* 27.6* 25.9*  MCV 110.4* 111.6* 112.7* 115.6*  --  114.7*  PLT 260 260 244 199  --  224    Cardiac Enzymes:  Recent Labs Lab 06/13/15 2205 06/14/15 0249 06/14/15 0800 06/14/15 1358  TROPONINI 0.06* <0.03 <0.03 <0.03    BNP: Invalid input(s): POCBNP  CBG:  Recent Labs Lab 06/18/15 0804 06/18/15 1103 06/18/15 1607 06/18/15 2110 06/19/15 0729  GLUCAP 125* 136* 134* 124* 116*    Microbiology: Results for orders placed or performed during the hospital encounter of 06/13/15  MRSA PCR Screening     Status: None   Collection Time: 06/14/15  2:01 AM  Result Value Ref Range Status   MRSA by PCR NEGATIVE NEGATIVE Final    Comment:        The GeneXpert MRSA Assay (FDA approved for NASAL specimens only), is one component of a comprehensive MRSA colonization surveillance program. It is not intended to diagnose MRSA infection nor to guide or monitor treatment for MRSA infections.   Culture, blood (routine x 2)     Status: None   Collection Time: 06/14/15  8:56 AM  Result Value Ref Range Status   Specimen Description BLOOD  BLD RIGHT ARM  Final   Special Requests   Final    BOTTLES DRAWN AEROBIC AND ANAEROBIC  7CC AERO, 4CC ANAERO    Culture NO GROWTH 5 DAYS  Final   Report Status 06/19/2015 FINAL  Final  Culture, blood (routine x 2)     Status: None   Collection Time: 06/14/15  9:04 AM  Result Value Ref Range Status   Specimen Description BLOOD RIGHT ASSIST CONTROL  Final   Special Requests   Final    BOTTLES DRAWN AEROBIC AND ANAEROBIC  4CC AERO, 8CC ANAERO   Culture NO GROWTH 5 DAYS  Final   Report Status 06/19/2015 FINAL  Final    Coagulation Studies: No results for input(s): LABPROT, INR in the last 72 hours.  Urinalysis: No results for input(s): COLORURINE, LABSPEC, PHURINE, GLUCOSEU, HGBUR, BILIRUBINUR, KETONESUR, PROTEINUR, UROBILINOGEN, NITRITE, LEUKOCYTESUR in the last 72 hours.  Invalid input(s): APPERANCEUR    Imaging: Dg Chest 1 View  06/18/2015  CLINICAL DATA:  Dyspnea EXAM: CHEST 1 VIEW COMPARISON:  06/15/2015 FINDINGS: There is a satisfactorily positioned right upper extremity PICC line. There is worsening confluent airspace opacity in the central and basilar left lung. No large effusion. Right lung is clear. No pneumothorax. IMPRESSION: Worsening left lung airspace opacity. This could represent pneumonia. Asymmetric alveolar edema is less likely. Aspiration not excluded. Electronically Signed   By: Ellery Plunkaniel R Mitchell M.D.   On: 06/18/2015 02:33     Medications:   .  sodium bicarbonate  infusion 1000 mL 50 mL/hr at 06/18/15 0851   . arformoterol  15 mcg Nebulization BID  . aspirin EC  81 mg Oral Daily  . atorvastatin  20 mg Oral q1800  . enoxaparin (LOVENOX) injection  40 mg Subcutaneous Q24H  . insulin aspart  0-9 Units Subcutaneous TID AC & HS  . lidocaine  1 patch Transdermal Q24H  . magic mouthwash  10 mL Oral TID  . meropenem (MERREM) IV  1 g Intravenous 3  times per day  . nicotine  21 mg Transdermal Daily  . pantoprazole  40 mg Oral Daily  . potassium chloride  20 mEq Oral BID  . sodium chloride  500 mL Intravenous Once  . sucralfate  1 g Oral TID WC & HS  . vancomycin  1,250 mg  Intravenous Q12H   acetaminophen, albuterol, ipratropium-albuterol, LORazepam, nicotine polacrilex, nitroGLYCERIN, ondansetron (ZOFRAN) IV, oxyCODONE-acetaminophen  Assessment/ Plan:  49 y.o. female with a PMHx of COPD, gastric ulcer, osteoarthritis, tobacco abuse, who was admitted to [facility] on 06/13/2015 for evaluation of shortness of breath and cough.  1. Hyponatremia.  Urine Na was very low at < 10.   2. Oral thrush.  Improved 3. Urinary tract infection. 4.  Metabolic acidosis due to use of hypertonic saline. 5.  Hypokalemia.  Plan:   The patient appears to have a very complex picture regarding her hyponatremia. Admission urine sodium was less than 10 which suggested volume depletion. However when administered IV fluids the patient had worsening of serum sodium which suggests SIADH clinically. She was treated with 3% saline which led to improvement however patient subsequently developed worsening shortness of breath. She also developed metabolic acidosis likely secondary to hyperchloremia. Acidosis has improved with the use of sodium bicarbonate drip however serum sodium has now dropped again. Therefore we will discontinue sodium bicarbonate drip. We would like to consider tolvaptan however we will avoid this for now given relative hypotension. Continue treatment of pneumonia as prescribed.   LOS: 5 Erica [name] 12/1/201610:52 AM

## 2015-06-20 ENCOUNTER — Inpatient Hospital Stay: Payer: 59

## 2015-06-20 ENCOUNTER — Encounter: Payer: Self-pay | Admitting: Anesthesiology

## 2015-06-20 ENCOUNTER — Encounter
Admission: EM | Disposition: A | Discharge status: Home / Self Care | Payer: Self-pay | Source: Home / Self Care | Attending: Internal Medicine

## 2015-06-20 DIAGNOSIS — J189 Pneumonia, unspecified organism: Secondary | ICD-10-CM | POA: Insufficient documentation

## 2015-06-20 DIAGNOSIS — R0600 Dyspnea, unspecified: Secondary | ICD-10-CM | POA: Insufficient documentation

## 2015-06-20 HISTORY — PX: FLEXIBLE BRONCHOSCOPY: SHX5094

## 2015-06-20 LAB — BLOOD GAS, ARTERIAL
Acid-base deficit: 2.6 mmol/L — ABNORMAL HIGH (ref 0.0–2.0)
Allens test (pass/fail): POSITIVE — AB
BICARBONATE: 19.1 meq/L — AB (ref 21.0–28.0)
FIO2: 0.28
O2 SAT: 95.5 %
PATIENT TEMPERATURE: 37
PCO2 ART: 25 mmHg — AB (ref 32.0–48.0)
PH ART: 7.49 — AB (ref 7.350–7.450)
PO2 ART: 72 mmHg — AB (ref 83.0–108.0)

## 2015-06-20 LAB — H PYLORI, IGM, IGG, IGA AB
H. Pylogi, Iga Abs: <9 units (ref 0.0–8.9)
H. Pylogi, Igm Abs: <9 units (ref 0.0–8.9)

## 2015-06-20 LAB — SODIUM
SODIUM: 128 mmol/L — AB (ref 135–145)
SODIUM: 130 mmol/L — AB (ref 135–145)
Sodium: 129 mmol/L — ABNORMAL LOW (ref 135–145)
Sodium: 131 mmol/L — ABNORMAL LOW (ref 135–145)
Sodium: 130 mmol/L — ABNORMAL LOW (ref 135–145)

## 2015-06-20 LAB — BASIC METABOLIC PANEL
ANION GAP: 6 (ref 5–15)
BUN: 16 mg/dL (ref 6–20)
CALCIUM: 7.8 mg/dL — AB (ref 8.9–10.3)
CO2: 19 mmol/L — AB (ref 22–32)
CREATININE: 1 mg/dL (ref 0.44–1.00)
Chloride: 103 mmol/L (ref 101–111)
GFR calc Af Amer: >60 mL/min (ref >60)
GLUCOSE: 100 mg/dL — AB (ref 65–99)
Potassium: 3.9 mmol/L (ref 3.5–5.1)
Sodium: 128 mmol/L — ABNORMAL LOW (ref 135–145)

## 2015-06-20 LAB — VANCOMYCIN, TROUGH
VANCOMYCIN TR: 31 ug/mL — AB (ref 10–20)
Vancomycin Tr: 18 ug/mL (ref 10–20)

## 2015-06-20 LAB — GLUCOSE, CAPILLARY
GLUCOSE-CAPILLARY: 94 mg/dL (ref 65–99)
GLUCOSE-CAPILLARY: 110 mg/dL — AB (ref 65–99)
Glucose-Capillary: 107 mg/dL — ABNORMAL HIGH (ref 65–99)

## 2015-06-20 LAB — CBC
HEMATOCRIT: 26.8 % — AB (ref 35.0–47.0)
Hemoglobin: 9 g/dL — ABNORMAL LOW (ref 12.0–16.0)
MCH: 38.5 pg — ABNORMAL HIGH (ref 26.0–34.0)
MCHC: 33.6 g/dL (ref 32.0–36.0)
MCV: 114.6 fL — AB (ref 80.0–100.0)
PLATELETS: 242 10*3/uL (ref 150–440)
RBC: 2.33 MIL/uL — ABNORMAL LOW (ref 3.80–5.20)
RDW: 20.3 % — AB (ref 11.5–14.5)
WBC: 10.3 10*3/uL (ref 3.6–11.0)

## 2015-06-20 LAB — PROTEIN ELECTROPHORESIS, SERUM
A/G Ratio: 0.7 (ref 0.7–1.7)
Albumin ELP: 2.4 g/dL — ABNORMAL LOW (ref 2.9–4.4)
Alpha-1-Globulin: 0.3 g/dL (ref 0.0–0.4)
Alpha-2-Globulin: 0.7 g/dL (ref 0.4–1.0)
Beta Globulin: 1.2 g/dL (ref 0.7–1.3)
GAMMA GLOBULIN: 1.1 g/dL (ref 0.4–1.8)
GLOBULIN, TOTAL: 3.3 g/dL (ref 2.2–3.9)
TOTAL PROTEIN ELP: 5.7 g/dL — AB (ref 6.0–8.5)

## 2015-06-20 LAB — OSMOLALITY, URINE: Osmolality, Ur: 737 mOsm/kg (ref 300–900)

## 2015-06-20 LAB — SODIUM, URINE, RANDOM: Sodium, Ur: <10 mmol/L

## 2015-06-20 LAB — CORTISOL: Cortisol, Plasma: 11.9 ug/dL

## 2015-06-20 SURGERY — BRONCHOSCOPY, FLEXIBLE
Anesthesia: General

## 2015-06-20 MED ORDER — LIDOCAINE HCL 2 % EX GEL: 1.0000 "application " | Freq: Once | CUTANEOUS | Status: DC

## 2015-06-20 MED ORDER — FUROSEMIDE 10 MG/ML IJ SOLN
20.0000 mg | Freq: Two times a day (BID) | INTRAMUSCULAR | Status: DC
  Administered 2015-06-20 – 2015-06-21 (×3): 20 mg via INTRAVENOUS
  Filled 2015-06-20 (×3): qty 2

## 2015-06-20 MED ORDER — FENTANYL CITRATE (PF) 100 MCG/2ML IJ SOLN
INTRAMUSCULAR | Status: AC
  Filled 2015-06-20: qty 4

## 2015-06-20 MED ORDER — MIDAZOLAM HCL 5 MG/5ML IJ SOLN
INTRAMUSCULAR | Status: AC
  Filled 2015-06-20: qty 10

## 2015-06-20 MED ORDER — AZITHROMYCIN 500 MG IV SOLR
500.0000 mg | INTRAVENOUS | Status: DC
  Administered 2015-06-21: 500 mg via INTRAVENOUS
  Filled 2015-06-20 (×2): qty 500

## 2015-06-20 MED ORDER — MIDAZOLAM HCL 5 MG/ML IJ SOLN
INTRAMUSCULAR | Status: AC | PRN
  Administered 2015-06-20 (×3): 2 mg via INTRAVENOUS

## 2015-06-20 MED ORDER — FENTANYL CITRATE (PF) 100 MCG/2ML IJ SOLN
INTRAMUSCULAR | Status: AC | PRN
  Administered 2015-06-20 (×2): 50 ug via INTRAVENOUS

## 2015-06-20 MED ORDER — BUTAMBEN-TETRACAINE-BENZOCAINE 2-2-14 % EX AERO: 1.0000 | INHALATION_SPRAY | Freq: Once | CUTANEOUS | Status: DC

## 2015-06-20 MED ORDER — AZITHROMYCIN 500 MG IV SOLR
500.0000 mg | Freq: Once | INTRAVENOUS | Status: AC
  Administered 2015-06-20: 500 mg via INTRAVENOUS
  Filled 2015-06-20: qty 500

## 2015-06-20 MED ORDER — VANCOMYCIN HCL 10 G IV SOLR: 1250.0000 mg | Freq: Every day | INTRAVENOUS | Status: DC | PRN

## 2015-06-20 MED ORDER — PHENYLEPHRINE HCL 0.25 % NA SOLN: 1.0000 | Freq: Four times a day (QID) | NASAL | Status: DC | PRN

## 2015-06-20 NOTE — Progress Notes (Addendum)
ANTIBIOTIC CONSULT NOTE - Follow Up  Pharmacy Consult for vancomycin and meropenem dosing Indication: HCAP  Allergies  Allergen Reactions  . Tramadol Other (See Comments)    Heart palpatations  . Penicillins Rash    Patient Measurements: Height:  (172.7 cm) Weight: 229 lb 8 oz (104.1 kg) IBW/kg (Calculated) : 63.9 Adjusted Body Weight: 78 kg  Vital Signs: Temp: 98.6 F (37 C) (12/02 0800) Temp Source: Oral (12/02 0800) BP: 94/62 mmHg (12/02 1100) Pulse Rate: 94 (12/02 1400) Intake/Output from previous day: 12/01 0701 - 12/02 0700 In: 250 [IV Piggyback:250] Out: -  Intake/Output from this shift: Total I/O In: 550 [IV Piggyback:550] Out: 1200 [Urine:1200]  Labs:  Recent Labs  06/18/15 0550 06/18/15 1012 06/19/15 0600 06/20/15 0459  WBC 10.4  --  12.1* 10.3  HGB 7.5* 9.2* 8.8* 9.0*  PLT 199  --  224 242  CREATININE 0.83  --  1.18* 1.00   Estimated Creatinine Clearance: 85 mL/min (by C-G formula based on Cr of 1).  Recent Labs  06/20/15 0003 06/20/15 1208  VANCOTROUGH 31* 18     Microbiology: Recent Results (from the past 720 hour(s))  MRSA PCR Screening     Status: None   Collection Time: 06/14/15  2:01 AM  Result Value Ref Range Status   MRSA by PCR NEGATIVE NEGATIVE Final    Comment:        The GeneXpert MRSA Assay (FDA approved for NASAL specimens only), is one component of a comprehensive MRSA colonization surveillance program. It is not intended to diagnose MRSA infection nor to guide or monitor treatment for MRSA infections.   Culture, blood (routine x 2)     Status: None   Collection Time: 06/14/15  8:56 AM  Result Value Ref Range Status   Specimen Description BLOOD  BLD RIGHT ARM  Final   Special Requests   Final    BOTTLES DRAWN AEROBIC AND ANAEROBIC  7CC AERO, 4CC ANAERO   Culture NO GROWTH 5 DAYS  Final   Report Status 06/19/2015 FINAL  Final  Culture, blood (routine x 2)     Status: None   Collection Time: 06/14/15  9:04  AM  Result Value Ref Range Status   Specimen Description BLOOD RIGHT ASSIST CONTROL  Final   Special Requests   Final    BOTTLES DRAWN AEROBIC AND ANAEROBIC  4CC AERO, 8CC ANAERO   Culture NO GROWTH 5 DAYS  Final   Report Status 06/19/2015 FINAL  Final  MRSA PCR Screening     Status: None   Collection Time: 06/19/15  4:29 PM  Result Value Ref Range Status   MRSA by PCR NEGATIVE NEGATIVE Final    Comment:        The GeneXpert MRSA Assay (FDA approved for NASAL specimens only), is one component of a comprehensive MRSA colonization surveillance program. It is not intended to diagnose MRSA infection nor to guide or monitor treatment for MRSA infections.     Medical History: Past Medical History  Diagnosis Date  . Arthritis   . COPD (chronic obstructive pulmonary disease) (HCC)   . Gastric ulcer   . OSA on CPAP no ins - no cpap machine for the last 4 years  . Asthma   . Hypertension      Assessment: Pharmacy consulted to dose Vancomycin and Meropenem for empiric coverage of HCAP in a 49 yo female.   Blood cx NGTD CXR: worsening left lung opacity 11/2: bronchial lavage showing rare WBC  and no organisms.  SCr: 1, est CrCl 85 mL/min 12/2: Azithromycin started   Plan:  Vancomycin discontinued per Dr. Dema SeverinMungal.  Will continue meropenem 1g every 8 hours. Pharmacy will continue to monitor labs and renal function and adjust as needed.     Cher NakaiSheema Dalanie Kisner, PharmD Pharmacy Resident 06/20/2015 2:40 PM

## 2015-06-20 NOTE — Consult Note (Signed)
GI will sign off at this time, Dr. Servando SnareWohl on call this weekend if GI problems are noted.

## 2015-06-20 NOTE — OR Nursing (Signed)
Dr Nicholos Johnsamachandran is requesting to transport patient back to unit.  Report called to Greybullhristina, Charity fundraiserN.  Pt is on 4 L [place].  Spo2 92%.

## 2015-06-20 NOTE — Progress Notes (Signed)
Initial Nutrition Assessment     INTERVENTION:   Meals and Snacks: Cater to patient preferences  NUTRITION DIAGNOSIS:   No nutrition diagnosis at this time  GOAL:   Patient will meet greater than or equal to 90% of their needs  MONITOR:    (Energy intake, Anthropometrics, Digestive System, Electrolyte/Renal Profile)  REASON FOR ASSESSMENT:   LOS    ASSESSMENT:    Pt admitted with hyponatremia, GI bleed with hx of multiple gastric ulcers; pt with acute respiratory failure with pneumonia s/p bronch today  Past Medical History  Diagnosis Date  . Arthritis   . COPD (chronic obstructive pulmonary disease) (HCC)   . Gastric ulcer   . OSA on CPAP no ins - no cpap machine for the last 4 years  . Asthma   . Hypertension     Diet Order:  Diet NPO time specified   Energy Intake: pt reports good appetite at present, reports family bringing in food for pt to eat  Food and Nutrition Related History: pt reports appetite fluctuates at home, sometimes she will go days without eating anything  Electrolyte and Renal Profile:  Recent Labs Lab 06/17/15 1229  06/18/15 0550  06/19/15 0600  06/20/15 0459 06/20/15 0800 06/20/15 1208  BUN  --   --  16  --  18  --  16  --   --   CREATININE  --   --  0.83  --  1.18*  --  1.00  --   --   NA 131*  < > 134*  < > 126*  < > 128* 129* 131*  K  --   --  2.8*  --  3.8  --  3.9  --   --   MG 2.0  --   --   --   --   --   --   --   --   < > = values in this interval not displayed. Glucose Profile:   Recent Labs  06/19/15 2054 06/20/15 0714 06/20/15 1118  GLUCAP 116* 94 107*   Meds: lasix, fentanyl, magic mouthwash, ss novolog  Height:   Ht Readings from Last 1 Encounters:  06/19/15 5\' 8"  (1.727 m)    Weight:pt reports stable wt give or take a few pounds  Wt Readings from Last 1 Encounters:  06/20/15 229 lb 8 oz (104.1 kg)     BMI:  Body mass index is 34.9 kg/(m^2).  LOW Care Level  Romelle Starcherate Markcus Lazenby MS, [place], LDN [phone]  [phone] Pager

## 2015-06-20 NOTE — Progress Notes (Signed)
[facility] [place] [facility]   PATIENT NAME: Erica [name]    MR#:  [id number]  DATE OF BIRTH:  September 27, 1965  SUBJECTIVE:  Had worsening in her lung infiltrates and hypoxia with some hypiotention- so transferred to stepdown. 06/20/15- bronchoscopy is done. Feels better today. Continue lasix.  Review of Systems  Constitutional: Negative for fever, chills and weight loss.  HENT: Negative for congestion.   Eyes: Negative for blurred vision and double vision.  Respiratory: Positive for cough and shortness of breath. Negative for sputum production and wheezing.   Cardiovascular: Positive for leg swelling. Negative for chest pain, palpitations, orthopnea and PND.  Gastrointestinal: Negative for nausea, vomiting, abdominal pain, diarrhea, constipation and blood in stool.  Genitourinary: Negative for dysuria, urgency, frequency and hematuria.  Musculoskeletal: Negative for falls.  Neurological: Negative for dizziness, tremors, focal weakness and headaches.  Endo/Heme/Allergies: Does not bruise/bleed easily.  Psychiatric/Behavioral: Negative for depression. The patient does not have insomnia.     VITAL SIGNS: Blood pressure 92/50, pulse 76, temperature 98.2 F (36.8 C), temperature source Oral, resp. rate 22, height  (1.727 m), weight 104.1 kg (229 lb 8 oz), last menstrual period 03/13/2015, SpO2 98 %.  PHYSICAL EXAMINATION:   GENERAL:  49 y.o.-year-old patient lying in the bed no acute distress  EYES: Pupils equal, round, reactive to light and accommodation. No scleral icterus. Extraocular muscles intact.  HEENT: Head atraumatic, normocephalic. Oropharynx and nasopharynx clear.  NECK:  Supple, no jugular venous distention. No thyroid enlargement, no tenderness. LUNGS:  no wheezing this am some crackles present. CARDIOVASCULAR: S1, S2 normal. No murmurs, rubs, or gallops.  ABDOMEN: Soft, nontender, nondistended. Bowel sounds present. No organomegaly or  mass.  EXTREMITIES: 1+ lower extremity and pedal edema, no cyanosis, or clubbing. Dorsalis pedis pulses are palpable at 1+ NEUROLOGIC: Cranial nerves II through XII are intact. Muscle strength 5/5 in all extremities. Sensation intact. Gait not checked.  PSYCHIATRIC: The patient is alert and oriented x 3. anxious SKIN: No obvious rash, lesion, or ulcer.   ORDERS/RESULTS REVIEWED:   CBC  Recent Labs Lab 06/15/15 0520 06/16/15 0655 06/18/15 0550 06/18/15 1012 06/19/15 0600 06/20/15 0459  WBC 12.2* 11.5* 10.4  --  12.1* 10.3  HGB 10.1* 9.0* 7.5* 9.2* 8.8* 9.0*  HCT 29.6* 26.9* 22.0* 27.6* 25.9* 26.8*  PLT 260 244 199  --  224 242  MCV 111.6* 112.7* 115.6*  --  114.7* 114.6*  MCH 37.9* 37.9* 39.1*  --  38.9* 38.5*  MCHC 34.0 33.7 33.9  --  33.9 33.6  RDW 19.6* 19.9* 20.7*  --  19.9* 20.3*   ------------------------------------------------------------------------------------------------------------------  Chemistries   Recent Labs Lab 06/14/15 2042  06/17/15 0450 06/17/15 0800  06/17/15 1229  06/18/15 0550  06/19/15 0600  06/20/15 0459 06/20/15 0800 06/20/15 1208 06/20/15 1641 06/20/15 2053  NA 118*  < > 128* 145  < > 131*  < > 134*  < > 126*  < > 128* 129* 131* 130* 130*  K 4.3  < > 4.2 4.1  --   --   --  2.8*  --  3.8  --  3.9  --   --   --   --   CL 87*  < > 102 122*  --   --   --  117*  --  100*  --  103  --   --   --   --   CO2 16*  < > 16* 14*  --   --   --  13*  --  19*  --  19*  --   --   --   --   GLUCOSE 195*  < > 126* 124*  --   --   --  98  --  135*  --  100*  --   --   --   --   BUN 20  < > 19 19  --   --   --  16  --  18  --  16  --   --   --   --   CREATININE 1.06*  < > 1.17* 1.06*  --   --   --  0.83  --  1.18*  --  1.00  --   --   --   --   CALCIUM 8.0*  < > 7.9* 7.3*  --   --   --  5.7*  --  7.8*  --  7.8*  --   --   --   --   MG  --   --   --   --   --  2.0  --   --   --   --   --   --   --   --   --   --   AST 51*  --   --   --   --   --   --   --    --   --   --   --   --   --   --   --   ALT 15  --   --   --   --   --   --   --   --   --   --   --   --   --   --   --   ALKPHOS 68  --   --   --   --   --   --   --   --   --   --   --   --   --   --   --   BILITOT 1.8*  --   --   --   --   --   --   --   --   --   --   --   --   --   --   --   < > = values in this interval not displayed. ------------------------------------------------------------------------------------------------------------------ estimated creatinine clearance is 85 mL/min (by C-G formula based on Cr of 1). ------------------------------------------------------------------------------------------------------------------  Recent Labs  06/19/15 1656  TSH 8.074*    Cardiac Enzymes  Recent Labs Lab 06/14/15 0249 06/14/15 0800 06/14/15 1358  TROPONINI <0.03 <0.03 <0.03   ------------------------------------------------------------------------------------------------------------------ Invalid input(s): POCBNP ---------------------------------------------------------------------------------------------------------------  RADIOLOGY: Dg Chest 1 View  06/20/2015  CLINICAL DATA:  Dyspnea EXAM: CHEST 1 VIEW COMPARISON:  06/18/2015 FINDINGS: There is airspace disease throughout the left lung, recently evaluated by chest CT. Less extensive airspace opacity at the right base. Normal heart size. Stable aortic and mediastinal contours. Right upper extremity PICC, tip at the distal SVC. IMPRESSION: Stable left more than right airspace disease, evaluated by CT yesterday. Electronically Signed   By: Marnee Spring M.D.   On: 06/20/2015 07:21   Ct Chest Wo Contrast  06/19/2015  CLINICAL DATA:  Patient with diffuse crackles throughout the lungs bilaterally. Upper extremity edema. Hypotension. EXAM: CT CHEST WITHOUT CONTRAST TECHNIQUE: Multidetector CT imaging of the chest was performed following  the standard protocol without IV contrast. COMPARISON:  Chest CT 06/14/2015 FINDINGS:  Mediastinum/Nodes: Right upper extremity PICC line is present with tip terminating at the superior cavoatrial junction. Visualized thyroid is unremarkable. 12 mm right peritracheal lymph node (image 20; series 2). Prominent pericardial phrenic lymph nodes measuring up to 11 mm (image 41; series 2). Prominent subcarinal lymph node measuring 14 mm (image 28; series 2). Normal heart size. Trace pericardial fluid. Low density of the blood pool. Small hiatal hernia. Lungs/Pleura: Central airways are patent. There is a 5 mm right upper lobe pulmonary nodule (image 20; series 3). Additionally there is a 6 mm right middle lobe pulmonary nodule (image 69; series 5). Grossly stable 5 mm left upper lobe pulmonary nodule (image 21; series 3). Interval development of ground-glass pulmonary opacities predominantly involving the left upper lobe, lingula and left lower lobe with smaller focal areas involving the right upper, right middle and right lower lobes, predominantly centrally. There is associated smooth interlobular septal thickening. Probable dependent atelectasis within right lower lobe. No large pleural effusion or pneumothorax. Upper abdomen: Small amount of perihepatic ascites. Liver is diffusely low in attenuation compatible with steatosis. Pancreatic parenchymal atrophy. Musculoskeletal: Anasarca and soft tissue edema overlying the right lower lateral chest wall. No aggressive or acute appearing osseous lesions. IMPRESSION: Interval development of left-greater-than-right ground-glass opacities with smooth interlobular septal thickening (crazy paving). Findings are nonspecific however may be secondary to edema, infection, hemorrhage or potentially respiratory distress syndrome (ARDS). Scattered pulmonary nodules. Attention on below recommended follow-up chest CT is recommended. Hepatic steatosis. New small amount of perihepatic ascites. Mildly enlarged mediastinal lymph nodes. These are nonspecific however likely  reactive in etiology. Given the multitude of findings, recommend short-term radiographic follow-up. Additionally follow-up chest CT is recommended in 6- 8 weeks to assess for interval resolution and/or improvement. Electronically Signed   By: Annia Beltrew  Davis M.D.   On: 06/19/2015 15:33   [place] Venous Img Upper Uni Right  06/19/2015  CLINICAL DATA:  Right upper extremity swelling for 5 days. EXAM: RIGHT UPPER EXTREMITY VENOUS DOPPLER ULTRASOUND TECHNIQUE: Gray-scale sonography with graded compression, as well as color Doppler and duplex ultrasound were performed to evaluate the upper extremity deep venous system from the level of the subclavian vein and including the jugular, axillary, basilic, radial, ulnar and upper cephalic vein. Spectral Doppler was utilized to evaluate flow at rest and with distal augmentation maneuvers. COMPARISON:  None. FINDINGS: Contralateral Subclavian Vein: Respiratory phasicity is normal and symmetric with the symptomatic side. No evidence of thrombus. Normal compressibility. Internal Jugular Vein: No evidence of thrombus. Normal compressibility, respiratory phasicity and response to augmentation. Subclavian Vein: No evidence of thrombus. Normal compressibility, respiratory phasicity and response to augmentation. Axillary Vein: No evidence of thrombus. Normal compressibility, respiratory phasicity and response to augmentation. Cephalic Vein: Nonocclusive hypoechoic intraluminal thrombus in the right cephalic vein compatible with superficial thrombosis. This does not propagate into the deep veins of the right upper extremity. Vessel remains compressible. Preserved phasic flow. Normal augmentation. Basilic Vein: No evidence of thrombus. Normal compressibility, respiratory phasicity and response to augmentation. Brachial Veins: No evidence of thrombus. Normal compressibility, respiratory phasicity and response to augmentation. Radial Veins: No evidence of thrombus. Normal compressibility,  respiratory phasicity and response to augmentation. Ulnar Veins: No evidence of thrombus. Normal compressibility, respiratory phasicity and response to augmentation. Venous Reflux:  None visualized. Other Findings:  Subcutaneous edema evident. IMPRESSION: No significant right upper extremity DVT Nonocclusive superficial thrombus in the right cephalic vein. Electronically Signed  By: Osvaldo Shipper M.D.   On: 06/19/2015 15:04    ASSESSMENT AND PLAN:  1. Acute respiratory distress due to COPD exacerbation: And worsening pneumonia   Had worsening on 06/17/15 night=- found worsening in infiltrate.  since 06/18/15 on broad spectrum ABx.   Repeat CT - worsening infiltrates.   Taken for bronchoscopy 06/20/15.    Stable now- so transfer to floor.  2. COPD exacerbation:  Broad spectrum ABx and and DuoNeb's  appreciated pulmonary help.   3. Acute on chronic diastolic heart failure: 2-D echocardiogram showed elevated right heart pressure likely secondary to underlying lung disease with diastolic dysfunction.  discontinue Nitropaste.   Slightly worsening- held his Lasix due to lower blood pressures on 06/19/15.   Cardiology on case.   BP stable- now on IV lasix.   Improved respi status.  4. Hyponatremia: Etiology of hyponatremia is unknown at this time. Her urine sodium was less than 10. Nephrology is following.  Continue monitoring sodium and given hypertonic solution.  Also given bicarbonate drip, still sodium level dropped further.  Nephrologist agreed with IV lasix.  5. Renal insufficiency,   unremarkable renal ultrasound, appreciated nephrology consult.  Slight worsening. Continue monitoring  6. Acute cystitis: Continue Levaquin no urine culture was performed.  On broad-spectrum antibiotics now.   Received levaquin for 3-4 days.  7. Elevated troponin, likely demand ischemia, echocardiogram showed normal ejection fraction, however, elevated central venous as well as pulmonary artery pressures,  now initiated on diuretic 8. Hyperglycemia. hemoglobin A1c 6.2 9. Tobacco abuse, Nicotine replacement therapy is initiated 10. Lower back pain: Continue when necessary pain meds  11. C/o abdominal discomfort after eating, and Hx of peptic u.cer disease.    appreciated GI consult. Started emperic PPI and sucralfate.  Management plans discussed with the patient and she is in agreement.   DRUG ALLERGIES:  Allergies  Allergen Reactions  . Tramadol Other (See Comments)    Heart palpatations  . Penicillins Rash    CODE STATUS:     Code Status Orders        Start     Ordered   06/14/15 0202  Full code   Continuous     06/14/15 0201    Advance Directive Documentation        Most Recent Value   Type of Advance Directive  Living will   Pre-existing out of facility DNR order (yellow form or pink MOST form)     "MOST" Form in Place?        TOTAL TIME TAKING CARE OF THIS PATIENT: 45 minutes.   Discussed with nephrology , pulmonology and cardiology team. Altamese Dilling M.D on 06/20/2015 at 9:44 PM  Between 7am to 6pm - Pager - [phone]  After 6pm go to [email or web address] - password [email or web address] [facility]  [facility]  Office  [phone]  CC: Primary care physician; [facility]

## 2015-06-20 NOTE — Procedures (Signed)
Pt arrived in bronch room.  Pt with right AC PICC line in place. Dressing with old drainage noted.  PICC line difficult to flush and after several attempts, able to flush line for use for procedure.  [facility] procedure.

## 2015-06-20 NOTE — Progress Notes (Signed)
  49 year old female with hyponatremia, pulmonary edema, transferred to the ICU with hypoxia and low blood pressure.  IMPRESSION:  Acute respiratory failure better, the patient has dyspnea, and has crackles on lung fields. -Review of her chest x-ray from 06/19/2015, report and images, shows new left-sided infiltrate, likely effusion versus atelectasis. -Agree with continued diuresis for likely pulmonary edema. -Status post bronchi on 12/2 with washings and lavage taken at the lingular area. No evidence of excess secretions seen in the airways, which might be indicative of a pneumonia.  Pneumonia -Patient has completed a course of antibiotics for pneumonia, she continues to have some bilateral infiltrates on her CAT scan which will likely persist. --We'll await results of bronchial cultures and adjust antibiotics accordingly.  Lung nodules. -The patient has lung nodule seen on most recent CAT scan, versus masslike infiltrate. Will need a repeat outpatient CAT scan.  Nicotine abuse. -Heavy smoker, discussed smoking cessation.  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  Thrush. -Possibly due to COPD inhalers.  Hypotension. -Fluid bolus, pressors if needed. Discussed with hospitalist physician, patient will be transferred to the intensive care unit for close monitoring.   Subjective: Pt still feels dyspneic. Currently oxygen saturation is 90% on room air.  Filed Vitals:   06/20/15 1021 06/20/15 1025 06/20/15 1037 06/20/15 1046  BP: 126/59 124/59  108/56  Pulse: 83 80 85 86  Temp:      TempSrc:      Resp: 16 23 31 21   Height:      Weight:      SpO2: 94% 86% 90% 92%   Appears chronically ill No overt distress No JVD noted Bibasilar scattered crackles, no wheezes RRR s M NABS Trace symmetric LE edema  BMP Latest Ref Rng 06/20/2015 06/20/2015 06/20/2015  Glucose 65 - 99 mg/dL - - 161(W100(H)  BUN 6 - 20 mg/dL - - 16  Creatinine 9.600.44 - 1.00 mg/dL - - 4.541.00  Sodium  098135 - 145 mmol/L 131(L) 129(L) 128(L)  Potassium 3.5 - 5.1 mmol/L - - 3.9  Chloride 101 - 111 mmol/L - - 103  CO2 22 - 32 mmol/L - - 19(L)  Calcium 8.9 - 10.3 mg/dL - - 7.8(L)    CBC Latest Ref Rng 06/20/2015 06/19/2015 06/18/2015  WBC 3.6 - 11.0 K/uL 10.3 12.1(H) -  Hemoglobin 12.0 - 16.0 g/dL 9.0(L) 8.8(L) 9.2(L)  Hematocrit 35.0 - 47.0 % 26.8(L) 25.9(L) 27.6(L)  Platelets 150 - 440 K/uL 242 224 -        The patient [facility] on 07-04-15 @ 9:30am.   Wells Guileseep Jinna Weinman, M.D.  Critical Care Attestation.  I have personally obtained a history, examined the patient, evaluated laboratory and imaging results, formulated the assessment and plan and placed orders. The Patient requires high complexity decision making for assessment and support, frequent evaluation and titration of therapies, application of advanced monitoring technologies and extensive interpretation of multiple databases. The patient has critical illness that could lead imminently to failure of 1 or more organ systems and requires the highest level of physician preparedness to intervene.  Critical Care Time devoted to patient care services described in this note is 35 minutes and is exclusive of time spent in procedures.

## 2015-06-20 NOTE — Progress Notes (Signed)
[facility]  ROUNDING NOTE   Subjective:  Sodium slightly improved to 128 today. Blood pressure appears to be slightly better today. Metabolic acidosis however persists with serum bicarbonate of 19 this a.m.  Objective:  Vital signs in last 24 hours:  Temp:  [96.8 F (36 C)-97.8 F (36.6 C)] 97.8 F (36.6 C) (12/02 0400) Pulse Rate:  [77-89] 84 (12/02 0500) Resp:  [20-34] 26 (12/02 0500) BP: (90-114)/(50-65) 99/58 mmHg (12/02 0500) SpO2:  [84 %-100 %] 96 % (12/02 0500) Weight:  [104.1 kg (229 lb 8 oz)-104.4 kg (230 lb 2.6 oz)] 104.1 kg (229 lb 8 oz) (12/02 0434)  Weight change: 5.289 kg (11 lb 10.6 oz) Filed Weights   06/19/15 0232 06/19/15 1450 06/20/15 0434  Weight: 99.111 kg (218 lb 8 oz) 104.4 kg (230 lb 2.6 oz) 104.1 kg (229 lb 8 oz)    Intake/Output: I/O last 3 completed shifts: In: 2013.3 [I.V.:863.3; IV Piggyback:1150] Out: 750 [Urine:750]   Intake/Output this shift:     Physical Exam: General: NAD, resting in bed   Head: Normocephalic, atraumatic. Moist oral mucosal membranes  Eyes: Anicteric  Neck: Supple, trachea midline  Lungs:  basilar rales, normal effort   Heart: Regular rate and rhythm  Abdomen:  Soft, nontender, bowel sounds present   Extremities:  trace peripheral edema.  Neurologic: Nonfocal, moving all four extremities  Skin: No lesions       Basic Metabolic Panel:  Recent Labs Lab 06/17/15 0450 06/17/15 0800  06/17/15 1229  06/18/15 0550  06/19/15 0600 06/19/15 1112 06/19/15 1656 06/19/15 2105 06/20/15 0003 06/20/15 0459  NA 128* 145  < > 131*  < > 134*  < > 126* 129* 129* 127* 128* 128*  K 4.2 4.1  --   --   --  2.8*  --  3.8  --   --   --   --  3.9  CL 102 122*  --   --   --  117*  --  100*  --   --   --   --  103  CO2 16* 14*  --   --   --  13*  --  19*  --   --   --   --  19*  GLUCOSE 126* 124*  --   --   --  98  --  135*  --   --   --   --  100*  BUN 19 19  --   --   --  16  --  18  --   --   --   --  16   CREATININE 1.17* 1.06*  --   --   --  0.83  --  1.18*  --   --   --   --  1.00  CALCIUM 7.9* 7.3*  --   --   --  5.7*  --  7.8*  --   --   --   --  7.8*  MG  --   --   --  2.0  --   --   --   --   --   --   --   --   --   < > = values in this interval not displayed.  Liver Function Tests:  Recent Labs Lab 06/14/15 2042  AST 51*  ALT 15  ALKPHOS 68  BILITOT 1.8*  PROT 6.6  ALBUMIN 2.9*   No results for input(s): LIPASE, AMYLASE in the  last 168 hours. No results for input(s): AMMONIA in the last 168 hours.  CBC:  Recent Labs Lab 06/15/15 0520 06/16/15 0655 06/18/15 0550 06/18/15 1012 06/19/15 0600 06/20/15 0459  WBC 12.2* 11.5* 10.4  --  12.1* 10.3  HGB 10.1* 9.0* 7.5* 9.2* 8.8* 9.0*  HCT 29.6* 26.9* 22.0* 27.6* 25.9* 26.8*  MCV 111.6* 112.7* 115.6*  --  114.7* 114.6*  PLT 260 244 199  --  224 242    Cardiac Enzymes:  Recent Labs Lab 06/13/15 2205 06/14/15 0249 06/14/15 0800 06/14/15 1358  TROPONINI 0.06* <0.03 <0.03 <0.03    BNP: Invalid input(s): POCBNP  CBG:  Recent Labs Lab 06/19/15 0729 06/19/15 1132 06/19/15 1615 06/19/15 2054 06/20/15 0714  GLUCAP 116* 118* 96 116* 94    Microbiology: Results for orders placed or performed during the hospital encounter of 06/13/15  MRSA PCR Screening     Status: None   Collection Time: 06/14/15  2:01 AM  Result Value Ref Range Status   MRSA by PCR NEGATIVE NEGATIVE Final    Comment:        The GeneXpert MRSA Assay (FDA approved for NASAL specimens only), is one component of a comprehensive MRSA colonization surveillance program. It is not intended to diagnose MRSA infection nor to guide or monitor treatment for MRSA infections.   Culture, blood (routine x 2)     Status: None   Collection Time: 06/14/15  8:56 AM  Result Value Ref Range Status   Specimen Description BLOOD  BLD RIGHT ARM  Final   Special Requests   Final    BOTTLES DRAWN AEROBIC AND ANAEROBIC  7CC AERO, 4CC ANAERO   Culture NO  GROWTH 5 DAYS  Final   Report Status 06/19/2015 FINAL  Final  Culture, blood (routine x 2)     Status: None   Collection Time: 06/14/15  9:04 AM  Result Value Ref Range Status   Specimen Description BLOOD RIGHT ASSIST CONTROL  Final   Special Requests   Final    BOTTLES DRAWN AEROBIC AND ANAEROBIC  4CC AERO, 8CC ANAERO   Culture NO GROWTH 5 DAYS  Final   Report Status 06/19/2015 FINAL  Final  MRSA PCR Screening     Status: None   Collection Time: 06/19/15  4:29 PM  Result Value Ref Range Status   MRSA by PCR NEGATIVE NEGATIVE Final    Comment:        The GeneXpert MRSA Assay (FDA approved for NASAL specimens only), is one component of a comprehensive MRSA colonization surveillance program. It is not intended to diagnose MRSA infection nor to guide or monitor treatment for MRSA infections.     Coagulation Studies: No results for input(s): LABPROT, INR in the last 72 hours.  Urinalysis: No results for input(s): COLORURINE, LABSPEC, PHURINE, GLUCOSEU, HGBUR, BILIRUBINUR, KETONESUR, PROTEINUR, UROBILINOGEN, NITRITE, LEUKOCYTESUR in the last 72 hours.  Invalid input(s): APPERANCEUR    Imaging: Dg Chest 1 View  06/20/2015  CLINICAL DATA:  Dyspnea EXAM: CHEST 1 VIEW COMPARISON:  06/18/2015 FINDINGS: There is airspace disease throughout the left lung, recently evaluated by chest CT. Less extensive airspace opacity at the right base. Normal heart size. Stable aortic and mediastinal contours. Right upper extremity PICC, tip at the distal SVC. IMPRESSION: Stable left more than right airspace disease, evaluated by CT yesterday. Electronically Signed   By: Marnee Spring M.D.   On: 06/20/2015 07:21   Ct Chest Wo Contrast  06/19/2015  CLINICAL DATA:  Patient with diffuse  crackles throughout the lungs bilaterally. Upper extremity edema. Hypotension. EXAM: CT CHEST WITHOUT CONTRAST TECHNIQUE: Multidetector CT imaging of the chest was performed following the standard protocol without IV  contrast. COMPARISON:  Chest CT 06/14/2015 FINDINGS: Mediastinum/Nodes: Right upper extremity PICC line is present with tip terminating at the superior cavoatrial junction. Visualized thyroid is unremarkable. 12 mm right peritracheal lymph node (image 20; series 2). Prominent pericardial phrenic lymph nodes measuring up to 11 mm (image 41; series 2). Prominent subcarinal lymph node measuring 14 mm (image 28; series 2). Normal heart size. Trace pericardial fluid. Low density of the blood pool. Small hiatal hernia. Lungs/Pleura: Central airways are patent. There is a 5 mm right upper lobe pulmonary nodule (image 20; series 3). Additionally there is a 6 mm right middle lobe pulmonary nodule (image 69; series 5). Grossly stable 5 mm left upper lobe pulmonary nodule (image 21; series 3). Interval development of ground-glass pulmonary opacities predominantly involving the left upper lobe, lingula and left lower lobe with smaller focal areas involving the right upper, right middle and right lower lobes, predominantly centrally. There is associated smooth interlobular septal thickening. Probable dependent atelectasis within right lower lobe. No large pleural effusion or pneumothorax. Upper abdomen: Small amount of perihepatic ascites. Liver is diffusely low in attenuation compatible with steatosis. Pancreatic parenchymal atrophy. Musculoskeletal: Anasarca and soft tissue edema overlying the right lower lateral chest wall. No aggressive or acute appearing osseous lesions. IMPRESSION: Interval development of left-greater-than-right ground-glass opacities with smooth interlobular septal thickening (crazy paving). Findings are nonspecific however may be secondary to edema, infection, hemorrhage or potentially respiratory distress syndrome (ARDS). Scattered pulmonary nodules. Attention on below recommended follow-up chest CT is recommended. Hepatic steatosis. New small amount of perihepatic ascites. Mildly enlarged mediastinal  lymph nodes. These are nonspecific however likely reactive in etiology. Given the multitude of findings, recommend short-term radiographic follow-up. Additionally follow-up chest CT is recommended in 6- 8 weeks to assess for interval resolution and/or improvement. Electronically Signed   By: Annia Belt M.D.   On: 06/19/2015 15:33   US Venous Img Upper Uni Right  06/19/2015  CLINICAL DATA:  Right upper extremity swelling for 5 days. EXAM: RIGHT UPPER EXTREMITY VENOUS DOPPLER ULTRASOUND TECHNIQUE: Gray-scale sonography with graded compression, as well as color Doppler and duplex ultrasound were performed to evaluate the upper extremity deep venous system from the level of the subclavian vein and including the jugular, axillary, basilic, radial, ulnar and upper cephalic vein. Spectral Doppler was utilized to evaluate flow at rest and with distal augmentation maneuvers. COMPARISON:  None. FINDINGS: Contralateral Subclavian Vein: Respiratory phasicity is normal and symmetric with the symptomatic side. No evidence of thrombus. Normal compressibility. Internal Jugular Vein: No evidence of thrombus. Normal compressibility, respiratory phasicity and response to augmentation. Subclavian Vein: No evidence of thrombus. Normal compressibility, respiratory phasicity and response to augmentation. Axillary Vein: No evidence of thrombus. Normal compressibility, respiratory phasicity and response to augmentation. Cephalic Vein: Nonocclusive hypoechoic intraluminal thrombus in the right cephalic vein compatible with superficial thrombosis. This does not propagate into the deep veins of the right upper extremity. Vessel remains compressible. Preserved phasic flow. Normal augmentation. Basilic Vein: No evidence of thrombus. Normal compressibility, respiratory phasicity and response to augmentation. Brachial Veins: No evidence of thrombus. Normal compressibility, respiratory phasicity and response to augmentation. Radial Veins: No  evidence of thrombus. Normal compressibility, respiratory phasicity and response to augmentation. Ulnar Veins: No evidence of thrombus. Normal compressibility, respiratory phasicity and response to augmentation. Venous Reflux:  None visualized.  Other Findings:  Subcutaneous edema evident. IMPRESSION: No significant right upper extremity DVT Nonocclusive superficial thrombus in the right cephalic vein. Electronically Signed   By: Judie PetitM.  Shick M.D.   On: 06/19/2015 15:04     Medications:     . arformoterol  15 mcg Nebulization BID  . aspirin EC  81 mg Oral Daily  . atorvastatin  20 mg Oral q1800  . [START ON 06/21/2015] enoxaparin (LOVENOX) injection  40 mg Subcutaneous Q24H  . furosemide  20 mg Intravenous BID  . insulin aspart  0-9 Units Subcutaneous TID AC & HS  . lidocaine  1 patch Transdermal Q24H  . magic mouthwash  10 mL Oral TID  . meropenem (MERREM) IV  1 g Intravenous 3 times per day  . nicotine  21 mg Transdermal Daily  . pantoprazole  40 mg Oral Daily  . potassium chloride  20 mEq Oral BID  . sucralfate  1 g Oral TID WC & HS   acetaminophen, albuterol, ipratropium-albuterol, LORazepam, nicotine polacrilex, nitroGLYCERIN, ondansetron (ZOFRAN) IV, oxyCODONE-acetaminophen, vancomycin  Assessment/ Plan:  49 y.o. female with a PMHx of COPD, gastric ulcer, osteoarthritis, tobacco abuse, who [facility] for evaluation of shortness of breath and cough.  1. Hyponatremia.  Urine Na was very low at < 10.   2. Oral thrush.  Improved 3. Urinary tract infection. 4.  Metabolic acidosis due to use of hypertonic saline. 5.  Hypokalemia.  Plan:  Patient will go her to CCU yesterday. Serum sodium has improved slightly up to 128. As before she appears to have complex picture regarding her underlying hyponatremia. Chest CT findings noted which could potentially lead to SIADH. At this point in time okay to continue Lasix. We may consider adding salt tablets as well.  Hypokalemia has been corrected and potassium up to 3.9.  We will continue to follow her progress closely with you.    LOS: 6 Delories Mauri 12/2/20168:31 AM

## 2015-06-20 NOTE — Progress Notes (Signed)
Patient transferred to floor from ICU. Patient is alert and last set of VS: BP 94/42 mmHg  Pulse 81  Temp(Src) 98 F (36.7 C) (Oral)  Resp 18  Ht 5\' 8"  (1.727 m)  Wt 104.1 kg (229 lb 8 oz)  BMI 34.90 kg/m2  SpO2 96%  LMP 03/13/2015 (Approximate). Patient is resting in bed with 8/10 lower back pain. Pain medication given, no other complaints at this time.

## 2015-06-20 NOTE — Sedation Documentation (Signed)
Pt tolerated procedure well.  Pt coughed throughout most of procedure.  Continuing to cough post procedure.  Pt has blood tinged sputum. Will recover and transport back to CCU

## 2015-06-20 NOTE — Progress Notes (Signed)
Patient: Erica [name] / Admit Date: 06/13/2015 / Date of Encounter: 06/20/2015, 8:17 AM   Subjective: She reports improvement in shortness of breath. She is not hypotensive. Edema is stable. She is going to have bronchoscopy done today at 10:00.  Review of Systems: Review of Systems  Constitutional: Positive for malaise/fatigue. Negative for fever, chills, weight loss and diaphoresis.  HENT: Negative for congestion.   Eyes: Negative for discharge and redness.  Respiratory: Positive for cough, shortness of breath and wheezing. Negative for hemoptysis and sputum production.   Cardiovascular: Positive for orthopnea, leg swelling and PND. Negative for chest pain, palpitations and claudication.  Gastrointestinal: Negative for nausea and vomiting.  Musculoskeletal: Negative for falls.  Skin: Negative for rash.  Neurological: Positive for weakness. Negative for dizziness, sensory change, speech change, focal weakness and loss of consciousness.  Endo/Heme/Allergies: Does not bruise/bleed easily.  Psychiatric/Behavioral: The patient is nervous/anxious.      Objective: Telemetry: NSR, 80's Physical Exam: Blood pressure 99/58, pulse 84, temperature 97.8 F (36.6 C), temperature source Oral, resp. rate 26, height 5\' 8"  (1.727 m), weight 229 lb 8 oz (104.1 kg), last menstrual period 03/13/2015, SpO2 96 %. Body mass index is 34.9 kg/(m^2). General: Well developed, well nourished, in no acute distress. Head: Normocephalic, atraumatic, sclera non-icteric, no xanthomas, nares are without discharge. Neck: Negative for carotid bruits. JVP not elevated. Lungs: Diffuse crackles, worse than prior. Breathing is mildly labored. Heart: RRR S1 S2 without murmurs, rubs, or gallops.  Abdomen: Soft, non-tender, non-distended with normoactive bowel sounds. No rebound/guarding. Extremities: No clubbing or cyanosis. 2+ pitting edema to the bilateral mid-thighs. Distal pedal pulses are 2+ and equal  bilaterally. Neuro: Alert and oriented X 3. Moves all extremities spontaneously. Psych:  Responds to questions appropriately with a normal affect.   Intake/Output Summary (Last 24 hours) at 06/20/15 0817 Last data filed at 06/19/15 1240  Gross per 24 hour  Intake    250 ml  Output      0 ml  Net    250 ml    Inpatient Medications:  . arformoterol  15 mcg Nebulization BID  . aspirin EC  81 mg Oral Daily  . atorvastatin  20 mg Oral q1800  . [START ON 06/21/2015] enoxaparin (LOVENOX) injection  40 mg Subcutaneous Q24H  . furosemide  20 mg Intravenous BID  . insulin aspart  0-9 Units Subcutaneous TID AC & HS  . lidocaine  1 patch Transdermal Q24H  . magic mouthwash  10 mL Oral TID  . meropenem (MERREM) IV  1 g Intravenous 3 times per day  . nicotine  21 mg Transdermal Daily  . pantoprazole  40 mg Oral Daily  . potassium chloride  20 mEq Oral BID  . sucralfate  1 g Oral TID WC & HS   Infusions:    Labs:  Recent Labs  06/17/15 1229  06/19/15 0600  06/20/15 0003 06/20/15 0459  NA 131*  < > 126*  < > 128* 128*  K  --   < > 3.8  --   --  3.9  CL  --   < > 100*  --   --  103  CO2  --   < > 19*  --   --  19*  GLUCOSE  --   < > 135*  --   --  100*  BUN  --   < > 18  --   --  16  CREATININE  --   < >  1.18*  --   --  1.00  CALCIUM  --   < > 7.8*  --   --  7.8*  MG 2.0  --   --   --   --   --   < > = values in this interval not displayed. No results for input(s): AST, ALT, ALKPHOS, BILITOT, PROT, ALBUMIN in the last 72 hours.  Recent Labs  06/19/15 0600 06/20/15 0459  WBC 12.1* 10.3  HGB 8.8* 9.0*  HCT 25.9* 26.8*  MCV 114.7* 114.6*  PLT 224 242   No results for input(s): CKTOTAL, CKMB, TROPONINI in the last 72 hours. Invalid input(s): POCBNP No results for input(s): HGBA1C in the last 72 hours.   Weights: Filed Weights   06/19/15 0232 06/19/15 1450 06/20/15 0434  Weight: 218 lb 8 oz (99.111 kg) 230 lb 2.6 oz (104.4 kg) 229 lb 8 oz (104.1 kg)      Radiology/Studies:  Exam at   Assessment and Plan   1. Respiratory distress/shortness of breath: CT scan of the chest showed ground glass appearance bilaterally worse on the left side which could represent positional edema versus atypical infection. She is going to have bronchoscopy done today at 10:00. Clinically, she appears to be volume overloaded and thus I started Lasix 20 mg IV twice daily.  2. Acute on chronic diastolic CHF: -Echo showing normal LV systolic function with Elevated right heart pressures likely secondary to underlying lung disease, diastolic dysfunction - Continue IV Lasix as outlined above. No need for pressors or inotropes.  3. COPD exacerbation: -Improving -Managed by pulmonary -Antibiotic, nebulizer  4. Hyponatremia: This seems to be stable and is managed by nephrology.    Signed, Lorine Bears, MD    06/20/2015, 8:17 AM

## 2015-06-20 NOTE — Progress Notes (Addendum)
ANTIBIOTIC CONSULT NOTE - Follow Up  Pharmacy Consult for vancomycin and meropenem dosing Indication: HCAP  Allergies  Allergen Reactions  . Tramadol Other (See Comments)    Heart palpatations  . Penicillins Rash    Patient Measurements: Height:  (172.7 cm) Weight: 230 lb 2.6 oz (104.4 kg) IBW/kg (Calculated) : 63.9 Adjusted Body Weight: 78 kg  Vital Signs: Temp: 96.8 F (36 C) (12/01 1450) Temp Source: Oral (12/01 1450) BP: 114/61 mmHg (12/02 0100) Pulse Rate: 85 (12/02 0100) Intake/Output from previous day: 12/01 0701 - 12/02 0700 In: 250 [IV Piggyback:250] Out: -  Intake/Output from this shift:    Labs:  Recent Labs  06/17/15 0800 06/18/15 0550 06/18/15 1012 06/19/15 0600  WBC  --  10.4  --  12.1*  HGB  --  7.5* 9.2* 8.8*  PLT  --  199  --  224  CREATININE 1.06* 0.83  --  1.18*   Estimated Creatinine Clearance: 72.1 mL/min (by C-G formula based on Cr of 1.18).  Recent Labs  06/20/15 0003  VANCOTROUGH 31*     Microbiology: Recent Results (from the past 720 hour(s))  MRSA PCR Screening     Status: None   Collection Time: 06/14/15  2:01 AM  Result Value Ref Range Status   MRSA by PCR NEGATIVE NEGATIVE Final    Comment:        The GeneXpert MRSA Assay (FDA approved for NASAL specimens only), is one component of a comprehensive MRSA colonization surveillance program. It is not intended to diagnose MRSA infection nor to guide or monitor treatment for MRSA infections.   Culture, blood (routine x 2)     Status: None   Collection Time: 06/14/15  8:56 AM  Result Value Ref Range Status   Specimen Description BLOOD  BLD RIGHT ARM  Final   Special Requests   Final    BOTTLES DRAWN AEROBIC AND ANAEROBIC  7CC AERO, 4CC ANAERO   Culture NO GROWTH 5 DAYS  Final   Report Status 06/19/2015 FINAL  Final  Culture, blood (routine x 2)     Status: None   Collection Time: 06/14/15  9:04 AM  Result Value Ref Range Status   Specimen Description BLOOD  RIGHT ASSIST CONTROL  Final   Special Requests   Final    BOTTLES DRAWN AEROBIC AND ANAEROBIC  4CC AERO, 8CC ANAERO   Culture NO GROWTH 5 DAYS  Final   Report Status 06/19/2015 FINAL  Final  MRSA PCR Screening     Status: None   Collection Time: 06/19/15  4:29 PM  Result Value Ref Range Status   MRSA by PCR NEGATIVE NEGATIVE Final    Comment:        The GeneXpert MRSA Assay (FDA approved for NASAL specimens only), is one component of a comprehensive MRSA colonization surveillance program. It is not intended to diagnose MRSA infection nor to guide or monitor treatment for MRSA infections.     Medical History: Past Medical History  Diagnosis Date  . Arthritis   . COPD (chronic obstructive pulmonary disease) (HCC)   . Gastric ulcer   . OSA on CPAP no ins - no cpap machine for the last 4 years  . Asthma   . Hypertension     Medications:  Vancomycin 1250 mg IV q12h Meropenem 1 gm IV q8h  Assessment: Pharmacy consulted to dose Vancomycin and Meropenem for empiric coverage of HCAP in a 49 yo female.   Blood cx NGTD CXR: worsening left  lung opacity  SCr: 1.18, est CrCl~70.2 mL/min, ke: 0.063, t1/2: 11h, Vd: 54.6 L   Goal of Therapy: Resolve infection Vancomycin trough 15-20.   Plan:  Patient with increase in SCr from 0.83 to 1.18.  Of note, patient received furosemide 20 mg IV q8h x 3 doses.  Ordered trough for 1230 today before dose but lab was not drawn and dose was administered at 1240 (RPh left MAR note to RN to please hold dose until trough was drawn).  Will reschedule trough for this evening at 2230.  Using current parameters, estimated trough is ~20.  Therefore, dose decrease may be warranted.  12/2 00:30 trough 31. Changed order to PRN. Level ordered for ~24 hours after last dose given. RN had hung 01:00 dose but only ran for ~ 5 minutes before taking down. BMP 12/2 AM.  Pharmacy will continue to follow.   Leesa Leifheit S 06/20/2015,1:22 AM

## 2015-06-21 LAB — CBC
HCT: 28.7 % — ABNORMAL LOW (ref 35.0–47.0)
HEMOGLOBIN: 9.7 g/dL — AB (ref 12.0–16.0)
MCH: 38.8 pg — AB (ref 26.0–34.0)
MCHC: 33.8 g/dL (ref 32.0–36.0)
MCV: 115 fL — ABNORMAL HIGH (ref 80.0–100.0)
Platelets: 238 10*3/uL (ref 150–440)
RBC: 2.49 MIL/uL — ABNORMAL LOW (ref 3.80–5.20)
RDW: 21 % — ABNORMAL HIGH (ref 11.5–14.5)
WBC: 7.7 10*3/uL (ref 3.6–11.0)

## 2015-06-21 LAB — T4: T4 TOTAL: 4.7 ug/dL (ref 4.5–12.0)

## 2015-06-21 LAB — GLUCOSE, CAPILLARY
GLUCOSE-CAPILLARY: 102 mg/dL — AB (ref 65–99)
Glucose-Capillary: 106 mg/dL — ABNORMAL HIGH (ref 65–99)
Glucose-Capillary: 115 mg/dL — ABNORMAL HIGH (ref 65–99)
Glucose-Capillary: 104 mg/dL — ABNORMAL HIGH (ref 65–99)

## 2015-06-21 LAB — SODIUM
SODIUM: 133 mmol/L — AB (ref 135–145)
SODIUM: 130 mmol/L — AB (ref 135–145)
Sodium: 131 mmol/L — ABNORMAL LOW (ref 135–145)

## 2015-06-21 MED ORDER — FUROSEMIDE 10 MG/ML IJ SOLN
40.0000 mg | Freq: Two times a day (BID) | INTRAMUSCULAR | Status: DC
  Administered 2015-06-21 – 2015-06-23 (×4): 40 mg via INTRAVENOUS
  Filled 2015-06-21 (×4): qty 4

## 2015-06-21 MED ORDER — SODIUM CHLORIDE 1 G PO TABS
1.0000 g | ORAL_TABLET | Freq: Three times a day (TID) | ORAL | Status: DC
  Administered 2015-06-21 – 2015-06-23 (×6): 1 g via ORAL
  Filled 2015-06-21 (×8): qty 1

## 2015-06-21 NOTE — Progress Notes (Signed)
[facility]  ROUNDING NOTE   Subjective:  Patient seen at bedside.  sodium currently 130. Appears to have bilateral pneumonia.  Objective:  Vital signs in last 24 hours:  Temp:  [97.4 F (36.3 C)-99.3 F (37.4 C)] 98.3 F (36.8 C) (12/03 1212) Pulse Rate:  [76-94] 87 (12/03 1212) Resp:  [18-31] 18 (12/03 0501) BP: (92-108)/(42-60) 108/60 mmHg (12/03 1212) SpO2:  [88 %-100 %] 96 % (12/03 1212) Weight:  [103.556 kg (228 lb 4.8 oz)] 103.556 kg (228 lb 4.8 oz) (12/03 0622)  Weight change: -0.844 kg (-1 lb 13.8 oz) Filed Weights   06/19/15 1450 06/20/15 0434 06/21/15 0622  Weight: 104.4 kg (230 lb 2.6 oz) 104.1 kg (229 lb 8 oz) 103.556 kg (228 lb 4.8 oz)    Intake/Output: I/O last 3 completed shifts: In: 1490 [P.O.:390; IV Piggyback:1100] Out: 1600 [Urine:1600]   Intake/Output this shift:  Total I/O In: 240 [P.O.:240] Out: 0   Physical Exam: General: NAD, resting in bed   Head: Normocephalic, atraumatic. Moist oral mucosal membranes  Eyes: Anicteric  Neck: Supple, trachea midline  Lungs:  basilar rales, normal effort   Heart: Regular rate and rhythm  Abdomen:  Soft, nontender, bowel sounds present   Extremities:  trace peripheral edema.  Neurologic: Nonfocal, moving all four extremities  Skin: No lesions       Basic Metabolic Panel:  Recent Labs Lab 06/17/15 0450 06/17/15 0800  06/17/15 1229  06/18/15 0550  06/19/15 0600  06/20/15 0459  06/20/15 1641 06/20/15 2053 06/21/15 0132 06/21/15 0538 06/21/15 0854  NA 128* 145  < > 131*  < > 134*  < > 126*  < > 128*  < > 130* 130* 133* 131* 130*  K 4.2 4.1  --   --   --  2.8*  --  3.8  --  3.9  --   --   --   --   --   --   CL 102 122*  --   --   --  117*  --  100*  --  103  --   --   --   --   --   --   CO2 16* 14*  --   --   --  13*  --  19*  --  19*  --   --   --   --   --   --   GLUCOSE 126* 124*  --   --   --  98  --  135*  --  100*  --   --   --   --   --   --   BUN 19 19  --   --   --  16   --  18  --  16  --   --   --   --   --   --   CREATININE 1.17* 1.06*  --   --   --  0.83  --  1.18*  --  1.00  --   --   --   --   --   --   CALCIUM 7.9* 7.3*  --   --   --  5.7*  --  7.8*  --  7.8*  --   --   --   --   --   --   MG  --   --   --  2.0  --   --   --   --   --   --   --   --   --   --   --   --   < > =  values in this interval not displayed.  Liver Function Tests:  Recent Labs Lab 06/14/15 2042  AST 51*  ALT 15  ALKPHOS 68  BILITOT 1.8*  PROT 6.6  ALBUMIN 2.9*   No results for input(s): LIPASE, AMYLASE in the last 168 hours. No results for input(s): AMMONIA in the last 168 hours.  CBC:  Recent Labs Lab 06/16/15 0655 06/18/15 0550 06/18/15 1012 06/19/15 0600 06/20/15 0459 06/21/15 0538  WBC 11.5* 10.4  --  12.1* 10.3 7.7  HGB 9.0* 7.5* 9.2* 8.8* 9.0* 9.7*  HCT 26.9* 22.0* 27.6* 25.9* 26.8* 28.7*  MCV 112.7* 115.6*  --  114.7* 114.6* 115.0*  PLT 244 199  --  224 242 238    Cardiac Enzymes:  Recent Labs Lab 06/14/15 1358  TROPONINI <0.03    BNP: Invalid input(s): POCBNP  CBG:  Recent Labs Lab 06/20/15 0714 06/20/15 1118 06/20/15 1606 06/21/15 0739 06/21/15 1136  GLUCAP 94 107* 110* 106* 115*    Microbiology: Results for orders placed or performed during the hospital encounter of 06/13/15  MRSA PCR Screening     Status: None   Collection Time: 06/14/15  2:01 AM  Result Value Ref Range Status   MRSA by PCR NEGATIVE NEGATIVE Final    Comment:        The GeneXpert MRSA Assay (FDA approved for NASAL specimens only), is one component of a comprehensive MRSA colonization surveillance program. It is not intended to diagnose MRSA infection nor to guide or monitor treatment for MRSA infections.   Culture, blood (routine x 2)     Status: None   Collection Time: 06/14/15  8:56 AM  Result Value Ref Range Status   Specimen Description BLOOD  BLD RIGHT ARM  Final   Special Requests   Final    BOTTLES DRAWN AEROBIC AND ANAEROBIC  7CC AERO,  4CC ANAERO   Culture NO GROWTH 5 DAYS  Final   Report Status 06/19/2015 FINAL  Final  Culture, blood (routine x 2)     Status: None   Collection Time: 06/14/15  9:04 AM  Result Value Ref Range Status   Specimen Description BLOOD RIGHT ASSIST CONTROL  Final   Special Requests   Final    BOTTLES DRAWN AEROBIC AND ANAEROBIC  4CC AERO, 8CC ANAERO   Culture NO GROWTH 5 DAYS  Final   Report Status 06/19/2015 FINAL  Final  MRSA PCR Screening     Status: None   Collection Time: 06/19/15  4:29 PM  Result Value Ref Range Status   MRSA by PCR NEGATIVE NEGATIVE Final    Comment:        The GeneXpert MRSA Assay (FDA approved for NASAL specimens only), is one component of a comprehensive MRSA colonization surveillance program. It is not intended to diagnose MRSA infection nor to guide or monitor treatment for MRSA infections.   Culture, bal-quantitative     Status: None (Preliminary result)   Collection Time: 06/20/15 11:32 AM  Result Value Ref Range Status   Specimen Description BRONCHIAL ALVEOLAR LAVAGE  Final   Special Requests Normal  Final   Gram Stain RARE WBC SEEN NO ORGANISMS SEEN   Final   Culture NO GROWTH < 24 HOURS  Final   Report Status PENDING  Incomplete  Culture, routine-sinus     Status: None (Preliminary result)   Collection Time: 06/20/15 11:32 AM  Result Value Ref Range Status   Specimen Description LUNG  Final   Special Requests Normal  Final  Culture NO GROWTH < 24 HOURS  Final   Report Status PENDING  Incomplete    Coagulation Studies: No results for input(s): LABPROT, INR in the last 72 hours.  Urinalysis: No results for input(s): COLORURINE, LABSPEC, PHURINE, GLUCOSEU, HGBUR, BILIRUBINUR, KETONESUR, PROTEINUR, UROBILINOGEN, NITRITE, LEUKOCYTESUR in the last 72 hours.  Invalid input(s): APPERANCEUR    Imaging: Dg Chest 1 View  06/20/2015  CLINICAL DATA:  Dyspnea EXAM: CHEST 1 VIEW COMPARISON:  06/18/2015 FINDINGS: There is airspace disease throughout  the left lung, recently evaluated by chest CT. Less extensive airspace opacity at the right base. Normal heart size. Stable aortic and mediastinal contours. Right upper extremity PICC, tip at the distal SVC. IMPRESSION: Stable left more than right airspace disease, evaluated by CT yesterday. Electronically Signed   By: Marnee SpringJonathon  Watts M.D.   On: 06/20/2015 07:21   Ct Chest Wo Contrast  06/19/2015  CLINICAL DATA:  Patient with diffuse crackles throughout the lungs bilaterally. Upper extremity edema. Hypotension. EXAM: CT CHEST WITHOUT CONTRAST TECHNIQUE: Multidetector CT imaging of the chest was performed following the standard protocol without IV contrast. COMPARISON:  Chest CT 06/14/2015 FINDINGS: Mediastinum/Nodes: Right upper extremity PICC line is present with tip terminating at the superior cavoatrial junction. Visualized thyroid is unremarkable. 12 mm right peritracheal lymph node (image 20; series 2). Prominent pericardial phrenic lymph nodes measuring up to 11 mm (image 41; series 2). Prominent subcarinal lymph node measuring 14 mm (image 28; series 2). Normal heart size. Trace pericardial fluid. Low density of the blood pool. Small hiatal hernia. Lungs/Pleura: Central airways are patent. There is a 5 mm right upper lobe pulmonary nodule (image 20; series 3). Additionally there is a 6 mm right middle lobe pulmonary nodule (image 69; series 5). Grossly stable 5 mm left upper lobe pulmonary nodule (image 21; series 3). Interval development of ground-glass pulmonary opacities predominantly involving the left upper lobe, lingula and left lower lobe with smaller focal areas involving the right upper, right middle and right lower lobes, predominantly centrally. There is associated smooth interlobular septal thickening. Probable dependent atelectasis within right lower lobe. No large pleural effusion or pneumothorax. Upper abdomen: Small amount of perihepatic ascites. Liver is diffusely low in attenuation  compatible with steatosis. Pancreatic parenchymal atrophy. Musculoskeletal: Anasarca and soft tissue edema overlying the right lower lateral chest wall. No aggressive or acute appearing osseous lesions. IMPRESSION: Interval development of left-greater-than-right ground-glass opacities with smooth interlobular septal thickening (crazy paving). Findings are nonspecific however may be secondary to edema, infection, hemorrhage or potentially respiratory distress syndrome (ARDS). Scattered pulmonary nodules. Attention on below recommended follow-up chest CT is recommended. Hepatic steatosis. New small amount of perihepatic ascites. Mildly enlarged mediastinal lymph nodes. These are nonspecific however likely reactive in etiology. Given the multitude of findings, recommend short-term radiographic follow-up. Additionally follow-up chest CT is recommended in 6- 8 weeks to assess for interval resolution and/or improvement. Electronically Signed   By: Annia Beltrew  Davis M.D.   On: 06/19/2015 15:33   [place] Venous Img Upper Uni Right  06/19/2015  CLINICAL DATA:  Right upper extremity swelling for 5 days. EXAM: RIGHT UPPER EXTREMITY VENOUS DOPPLER ULTRASOUND TECHNIQUE: Gray-scale sonography with graded compression, as well as color Doppler and duplex ultrasound were performed to evaluate the upper extremity deep venous system from the level of the subclavian vein and including the jugular, axillary, basilic, radial, ulnar and upper cephalic vein. Spectral Doppler was utilized to evaluate flow at rest and with distal augmentation maneuvers. COMPARISON:  None. FINDINGS: Contralateral  Subclavian Vein: Respiratory phasicity is normal and symmetric with the symptomatic side. No evidence of thrombus. Normal compressibility. Internal Jugular Vein: No evidence of thrombus. Normal compressibility, respiratory phasicity and response to augmentation. Subclavian Vein: No evidence of thrombus. Normal compressibility, respiratory phasicity and  response to augmentation. Axillary Vein: No evidence of thrombus. Normal compressibility, respiratory phasicity and response to augmentation. Cephalic Vein: Nonocclusive hypoechoic intraluminal thrombus in the right cephalic vein compatible with superficial thrombosis. This does not propagate into the deep veins of the right upper extremity. Vessel remains compressible. Preserved phasic flow. Normal augmentation. Basilic Vein: No evidence of thrombus. Normal compressibility, respiratory phasicity and response to augmentation. Brachial Veins: No evidence of thrombus. Normal compressibility, respiratory phasicity and response to augmentation. Radial Veins: No evidence of thrombus. Normal compressibility, respiratory phasicity and response to augmentation. Ulnar Veins: No evidence of thrombus. Normal compressibility, respiratory phasicity and response to augmentation. Venous Reflux:  None visualized. Other Findings:  Subcutaneous edema evident. IMPRESSION: No significant right upper extremity DVT Nonocclusive superficial thrombus in the right cephalic vein. Electronically Signed   By: Judie Petit.  Shick M.D.   On: 06/19/2015 15:04     Medications:     . arformoterol  15 mcg Nebulization BID  . aspirin EC  81 mg Oral Daily  . atorvastatin  20 mg Oral q1800  . azithromycin  500 mg Intravenous Q24H  . enoxaparin (LOVENOX) injection  40 mg Subcutaneous Q24H  . furosemide  20 mg Intravenous BID  . insulin aspart  0-9 Units Subcutaneous TID AC & HS  . lidocaine  1 patch Transdermal Q24H  . magic mouthwash  10 mL Oral TID  . meropenem (MERREM) IV  1 g Intravenous 3 times per day  . nicotine  21 mg Transdermal Daily  . pantoprazole  40 mg Oral Daily  . potassium chloride  20 mEq Oral BID  . sodium chloride  1 g Oral TID WC  . sucralfate  1 g Oral TID WC & HS   acetaminophen, albuterol, ipratropium-albuterol, LORazepam, nicotine polacrilex, nitroGLYCERIN, ondansetron (ZOFRAN) IV, oxyCODONE-acetaminophen,  vancomycin  Assessment/ Plan:  49 y.o. female with a PMHx of COPD, gastric ulcer, osteoarthritis, tobacco abuse, who was admitted to [facility] on 06/13/2015 for evaluation of shortness of breath and cough.  1. Hyponatremia.  Urine Na was very low at < 10.   2. Oral thrush.  Improved 3. Urinary tract infection. 4.  Metabolic acidosis due to use of hypertonic saline. 5.  Hypokalemia. 6.  Bacterial pneumonia (bilateral)  Plan:   serum sodium currently 130. 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 She continues to be treated with antibiotic therapy and is currently on meropenem and azithromycin for her pneumonia. Patient did previously develop metabolic acidosis secondary to use of hypertonic saline. Consider follow-up BMP tomorrow. Continue to monitor serum sodium daily for now.    LOS: 7 Rilie Glanz 12/3/20161:10 PM

## 2015-06-21 NOTE — Progress Notes (Addendum)
Patient: Erica [name] / Admit Date: 06/13/2015 / Date of Encounter: 06/21/2015, 2:59 PM   Subjective: She reports improvement in shortness of breath.  Bronchoscopy went well yesterday.  She continues to have edema but notices improvement.  Review of Systems: Review of Systems  Constitutional: Positive for malaise/fatigue. Negative for fever, chills, weight loss and diaphoresis.  HENT: Negative for congestion.   Eyes: Negative for discharge and redness.  Respiratory: Positive for cough, shortness of breath and wheezing. Negative for hemoptysis and sputum production.   Cardiovascular: Positive for orthopnea, leg swelling and PND. Negative for chest pain, palpitations and claudication.  Gastrointestinal: Negative for nausea and vomiting.  Musculoskeletal: Negative for falls.  Skin: Negative for rash.  Neurological: Positive for weakness. Negative for dizziness, sensory change, speech change, focal weakness and loss of consciousness.  Endo/Heme/Allergies: Does not bruise/bleed easily.  Psychiatric/Behavioral: The patient is nervous/anxious.      Objective: Telemetry: NSR, 80's Physical Exam: Blood pressure 108/60, pulse 87, temperature 98.3 F (36.8 C), temperature source Oral, resp. rate 18, height  (1.727 m), weight 103.556 kg (228 lb 4.8 oz), last menstrual period 03/13/2015, SpO2 96 %. Body mass index is 34.72 kg/(m^2). General: Well developed, well nourished, in no acute distress. Head: Normocephalic, atraumatic, sclera non-icteric, no xanthomas, nares are without discharge. Neck: Negative for carotid bruits. JVP 3 cm above the clavicle at 45 degrees Lungs: Mild bibasilar crackles, no rhonchi or wheezes Heart: RRR S1 S2 without murmurs, rubs, or gallops.  Abdomen: Soft, non-tender, non-distended with normoactive bowel sounds. No rebound/guarding. Extremities: No clubbing or cyanosis. 2+ pitting edema to the bilateral mid-thighs. Distal pedal pulses are 2+ and equal  bilaterally. Neuro: Alert and oriented X 3. Moves all extremities spontaneously. Psych:  Responds to questions appropriately with a normal affect.   Intake/Output Summary (Last 24 hours) at 06/21/15 1459 Last data filed at 06/21/15 1300  Gross per 24 hour  Intake   1420 ml  Output    400 ml  Net   1020 ml    Inpatient Medications:  . arformoterol  15 mcg Nebulization BID  . aspirin EC  81 mg Oral Daily  . atorvastatin  20 mg Oral q1800  . azithromycin  500 mg Intravenous Q24H  . enoxaparin (LOVENOX) injection  40 mg Subcutaneous Q24H  . furosemide  20 mg Intravenous BID  . insulin aspart  0-9 Units Subcutaneous TID AC & HS  . lidocaine  1 patch Transdermal Q24H  . magic mouthwash  10 mL Oral TID  . meropenem (MERREM) IV  1 g Intravenous 3 times per day  . nicotine  21 mg Transdermal Daily  . pantoprazole  40 mg Oral Daily  . potassium chloride  20 mEq Oral BID  . sodium chloride  1 g Oral TID WC  . sucralfate  1 g Oral TID WC & HS   Infusions:    Labs:  Recent Labs  06/19/15 0600  06/20/15 0459  06/21/15 0538 06/21/15 0854  NA 126*  < > 128*  < > 131* 130*  K 3.8  --  3.9  --   --   --   CL 100*  --  103  --   --   --   CO2 19*  --  19*  --   --   --   GLUCOSE 135*  --  100*  --   --   --   BUN 18  --  16  --   --   --  CREATININE 1.18*  --  1.00  --   --   --   CALCIUM 7.8*  --  7.8*  --   --   --   < > = values in this interval not displayed. No results for input(s): AST, ALT, ALKPHOS, BILITOT, PROT, ALBUMIN in the last 72 hours.  Recent Labs  06/20/15 0459 06/21/15 0538  WBC 10.3 7.7  HGB 9.0* 9.7*  HCT 26.8* 28.7*  MCV 114.6* 115.0*  PLT 242 238   No results for input(s): CKTOTAL, CKMB, TROPONINI in the last 72 hours. Invalid input(s): POCBNP No results for input(s): HGBA1C in the last 72 hours.   Weights: Filed Weights   06/19/15 1450 06/20/15 0434 06/21/15 0622  Weight: 104.4 kg (230 lb 2.6 oz) 104.1 kg (229 lb 8 oz) 103.556 kg (228 lb 4.8  oz)     Radiology/Studies:  Exam at   Assessment and Plan   1. Respiratory distress/shortness of breath: Likely due to infection/pulmonary disease.  There is mild pulmonary edema, possibly due to diastolic dysfunction and volume overload from SIADH.   - Antibiotics per primary team - Increase lasix to 40 mg IV BID  2. Acute on chronic diastolic CHF: Her volume status does not seem to be improving much and overall she is reported net fluid positive since admission.  It appears that ins/outs have not been accurately assessed. -Echo showing normal LV systolic function with Elevated right heart pressures likely secondary to underlying lung disease, diastolic dysfunction - Increase lasix to 40 mg bid - Strict I/Os  3. COPD exacerbation: -Improving -Managed by pulmonary -Antibiotic, nebulizer  4. Hyponatremia: This seems to be stable and is managed by nephrology.  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  She seems to have hypervolemic hyponatremia.  - Increase lasix to 40 mg IV bid as above.    Signed, Willean Schurman C. Duke Salviaandolph, MD, [facility]    06/21/2015, 2:59 PM

## 2015-06-21 NOTE — Progress Notes (Signed)
[facility] [place] [facility]   PATIENT NAME: Erica ChacoDonna Dack    MR#:  [id number]  DATE OF BIRTH:  1965/11/15  SUBJECTIVE:  Had worsening in her lung infiltrates and hypoxia with some hypiotention- so transferred to stepdown. 06/20/15- bronchoscopy is done.   No complaint, off O2 [place].  Review of Systems  Constitutional: Negative for fever, chills and weight loss.  HENT: Negative for congestion.   Eyes: Negative for blurred vision and double vision.  Respiratory: Negative for cough, sputum production, shortness of breath and wheezing.   Cardiovascular: Positive for leg swelling. Negative for chest pain, palpitations, orthopnea and PND.  Gastrointestinal: Negative for nausea, vomiting, abdominal pain, diarrhea, constipation and blood in stool.  Genitourinary: Negative for dysuria, urgency, frequency and hematuria.  Musculoskeletal: Negative for falls.  Neurological: Negative for dizziness, tremors, focal weakness and headaches.  Endo/Heme/Allergies: Does not bruise/bleed easily.  Psychiatric/Behavioral: Negative for depression. The patient does not have insomnia.     VITAL SIGNS: Blood pressure 108/60, pulse 87, temperature 98.3 F (36.8 C), temperature source Oral, resp. rate 18, height 5\' 8"  (1.727 m), weight 103.556 kg (228 lb 4.8 oz), last menstrual period 03/13/2015, SpO2 96 %.  PHYSICAL EXAMINATION:   GENERAL:  49 y.o.-year-old patient lying in the bed no acute distress  EYES: Pupils equal, round, reactive to light and accommodation. No scleral icterus. Extraocular muscles intact.  HEENT: Head atraumatic, normocephalic. Oropharynx and nasopharynx clear.  NECK:  Supple, no jugular venous distention. No thyroid enlargement, no tenderness. LUNGS:  Bilateral air entry, no wheezing or crackles present. No use of accessory muscles to breathe. CARDIOVASCULAR: S1, S2 normal. No murmurs, rubs, or gallops.  ABDOMEN: Soft, nontender, nondistended. Bowel sounds  present. No organomegaly or mass.  EXTREMITIES: 1+ lower extremity and pedal edema, no cyanosis, or clubbing. Dorsalis pedis pulses are palpable at 1+ NEUROLOGIC: Cranial nerves II through XII are intact. Muscle strength 5/5 in all extremities. Sensation intact. Gait not checked.  PSYCHIATRIC: The patient is alert and oriented x 3. anxious SKIN: No obvious rash, lesion, or ulcer.   ORDERS/RESULTS REVIEWED:   CBC  Recent Labs Lab 06/16/15 0655 06/18/15 0550 06/18/15 1012 06/19/15 0600 06/20/15 0459 06/21/15 0538  WBC 11.5* 10.4  --  12.1* 10.3 7.7  HGB 9.0* 7.5* 9.2* 8.8* 9.0* 9.7*  HCT 26.9* 22.0* 27.6* 25.9* 26.8* 28.7*  PLT 244 199  --  224 242 238  MCV 112.7* 115.6*  --  114.7* 114.6* 115.0*  MCH 37.9* 39.1*  --  38.9* 38.5* 38.8*  MCHC 33.7 33.9  --  33.9 33.6 33.8  RDW 19.9* 20.7*  --  19.9* 20.3* 21.0*   ------------------------------------------------------------------------------------------------------------------  Chemistries   Recent Labs Lab 06/14/15 2042  06/17/15 0450 06/17/15 0800  06/17/15 1229  06/18/15 0550  06/19/15 0600  06/20/15 0459  06/20/15 1641 06/20/15 2053 06/21/15 0132 06/21/15 0538 06/21/15 0854  NA 118*  < > 128* 145  < > 131*  < > 134*  < > 126*  < > 128*  < > 130* 130* 133* 131* 130*  K 4.3  < > 4.2 4.1  --   --   --  2.8*  --  3.8  --  3.9  --   --   --   --   --   --   CL 87*  < > 102 122*  --   --   --  117*  --  100*  --  103  --   --   --   --   --   --  CO2 16*  < > 16* 14*  --   --   --  13*  --  19*  --  19*  --   --   --   --   --   --   GLUCOSE 195*  < > 126* 124*  --   --   --  98  --  135*  --  100*  --   --   --   --   --   --   BUN 20  < > 19 19  --   --   --  16  --  18  --  16  --   --   --   --   --   --   CREATININE 1.06*  < > 1.17* 1.06*  --   --   --  0.83  --  1.18*  --  1.00  --   --   --   --   --   --   CALCIUM 8.0*  < > 7.9* 7.3*  --   --   --  5.7*  --  7.8*  --  7.8*  --   --   --   --   --   --   MG  --    --   --   --   --  2.0  --   --   --   --   --   --   --   --   --   --   --   --   AST 51*  --   --   --   --   --   --   --   --   --   --   --   --   --   --   --   --   --   ALT 15  --   --   --   --   --   --   --   --   --   --   --   --   --   --   --   --   --   ALKPHOS 68  --   --   --   --   --   --   --   --   --   --   --   --   --   --   --   --   --   BILITOT 1.8*  --   --   --   --   --   --   --   --   --   --   --   --   --   --   --   --   --   < > = values in this interval not displayed. ------------------------------------------------------------------------------------------------------------------ estimated creatinine clearance is 84.8 mL/min (by C-G formula based on Cr of 1). ------------------------------------------------------------------------------------------------------------------  Recent Labs  06/19/15 1656 06/20/15 1208  TSH 8.074*  --   T4TOTAL  --  4.7    Cardiac Enzymes No results for input(s): CKMB, TROPONINI, MYOGLOBIN in the last 168 hours.  Invalid input(s): CK ------------------------------------------------------------------------------------------------------------------ Invalid input(s): POCBNP ---------------------------------------------------------------------------------------------------------------  RADIOLOGY: Dg Chest 1 View  06/20/2015  CLINICAL DATA:  Dyspnea EXAM: CHEST 1 VIEW COMPARISON:  06/18/2015 FINDINGS: There is airspace disease throughout the left lung, recently evaluated by chest CT. Less extensive airspace opacity  at the right base. Normal heart size. Stable aortic and mediastinal contours. Right upper extremity PICC, tip at the distal SVC. IMPRESSION: Stable left more than right airspace disease, evaluated by CT yesterday. Electronically Signed   By: Marnee Spring M.D.   On: 06/20/2015 07:21    ASSESSMENT AND PLAN:  1. Acute respiratory distress due to COPD exacerbation and pneumonia.   Had worsening on 06/17/15  night=- found worsening in infiltrate.  since 06/18/15 on broad spectrum ABx.   Repeat CT - worsening infiltrates.   Taken for bronchoscopy 06/20/15.  improved. Off O2 [place]. Continue nebulizer when necessary. Blood culture and the sputum culture negative.  Continue Zithromax and meropenem, discontinue vancomycin.  2. COPD exacerbation:  Continue DuoNeb's   3. Acute on chronic diastolic heart failure: 2-D echocardiogram showed elevated right heart pressure likely secondary to underlying lung disease with diastolic dysfunction. Increase IV lasix to 40 mg IV twice a day per cardiology consult.   4. 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 Her urine sodium was less than 10. Improving.  Was given hypertonic solution.  Also was given bicarbonate drip, still sodium level dropped further.  Nephrologist agreed with IV lasix. Follow-up BMP.  5. Renal insufficiency,   unremarkable renal ultrasound, appreciated nephrology consult. Improved.  6. Acute cystitis: Continue Levaquin no urine culture was performed.  On broad-spectrum antibiotics now.   Received levaquin for 3-4 days.  7. Elevated troponin, likely demand ischemia, echocardiogram showed normal ejection fraction.   8. Hyperglycemia. hemoglobin A1c 6.2 9. Tobacco abuse, Nicotine replacement therapy was initiated 10. Lower back pain: Continue when necessary pain meds  11. C/o abdominal discomfort after eating, and Hx of peptic u.cer disease.    appreciated GI consult. Started emperic PPI and sucralfate.  Management plans discussed with the patient and she is in agreement. Greater than 50% time was spent on coordination of care and face-to-face counseling.  DRUG ALLERGIES:  Allergies  Allergen Reactions  . Tramadol Other (See Comments)    Heart palpatations  . Penicillins Rash    CODE STATUS:     Code Status Orders        Start     Ordered   06/14/15 0202  Full code   Continuous      06/14/15 0201    Advance Directive Documentation        Most Recent Value   Type of Advance Directive  Living will   Pre-existing out of facility DNR order (yellow form or pink MOST form)     "MOST" Form in Place?        TOTAL TIME TAKING CARE OF THIS PATIENT: 43 minutes.   Discussed with nephrology , pulmonology and cardiology team. Shaune Pollack M.D on 06/21/2015 at 3:53 PM  Between 7am to 6pm - Pager - [phone]  After 6pm go to [email or web address] - password [email or web address] [facility]  [facility]  Office  [phone]  CC: Primary care physician; [facility]

## 2015-06-22 LAB — GLUCOSE, CAPILLARY
GLUCOSE-CAPILLARY: 91 mg/dL (ref 65–99)
GLUCOSE-CAPILLARY: 94 mg/dL (ref 65–99)
Glucose-Capillary: 95 mg/dL (ref 65–99)
Glucose-Capillary: 102 mg/dL — ABNORMAL HIGH (ref 65–99)

## 2015-06-22 LAB — BASIC METABOLIC PANEL
ANION GAP: 8 (ref 5–15)
BUN: 14 mg/dL (ref 6–20)
CHLORIDE: 100 mmol/L — AB (ref 101–111)
CO2: 22 mmol/L (ref 22–32)
Calcium: 7.9 mg/dL — ABNORMAL LOW (ref 8.9–10.3)
Creatinine, Ser: 0.87 mg/dL (ref 0.44–1.00)
GFR calc non Af Amer: >60 mL/min (ref >60)
GLUCOSE: 95 mg/dL (ref 65–99)
Potassium: 3.2 mmol/L — ABNORMAL LOW (ref 3.5–5.1)
Sodium: 130 mmol/L — ABNORMAL LOW (ref 135–145)

## 2015-06-22 LAB — MAGNESIUM: Magnesium: 1.8 mg/dL (ref 1.7–2.4)

## 2015-06-22 MED ORDER — POTASSIUM CHLORIDE CRYS ER 20 MEQ PO TBCR
40.0000 meq | EXTENDED_RELEASE_TABLET | Freq: Once | ORAL | Status: AC
  Administered 2015-06-22: 40 meq via ORAL
  Filled 2015-06-22: qty 2

## 2015-06-22 MED ORDER — AZITHROMYCIN 250 MG PO TABS
500.0000 mg | ORAL_TABLET | Freq: Every day | ORAL | Status: DC
  Administered 2015-06-22 – 2015-06-23 (×2): 500 mg via ORAL
  Filled 2015-06-22 (×2): qty 2

## 2015-06-22 MED ORDER — BUDESONIDE 0.25 MG/2ML IN SUSP
0.2500 mg | Freq: Two times a day (BID) | RESPIRATORY_TRACT | Status: DC
  Administered 2015-06-22 – 2015-06-23 (×2): 0.25 mg via RESPIRATORY_TRACT
  Filled 2015-06-22 (×2): qty 2
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
## 2015-06-22 NOTE — Progress Notes (Signed)
Patient: Erica [name] / Admit Date: 06/13/2015 / Date of Encounter: 06/22/2015, 1:47 PM   Subjective: She reports improvement in shortness of breath.  She has been urinating frequently on the higher dose of lasix.  She denies any lightheadedness or dizziness.  She notes improvement in the lower extremity edema.  Review of Systems: Review of Systems  Constitutional: Positive for malaise/fatigue. Negative for fever, chills, weight loss and diaphoresis.  HENT: Negative for congestion.   Eyes: Negative for discharge and redness.  Respiratory: Positive for cough, shortness of breath and wheezing. Negative for hemoptysis and sputum production.   Cardiovascular: Positive for orthopnea, leg swelling and PND. Negative for chest pain, palpitations and claudication.  Gastrointestinal: Negative for nausea and vomiting.  Musculoskeletal: Negative for falls.  Skin: Negative for rash.  Neurological: Positive for weakness. Negative for dizziness, sensory change, speech change, focal weakness and loss of consciousness.  Endo/Heme/Allergies: Does not bruise/bleed easily.  Psychiatric/Behavioral: The patient is nervous/anxious.      Objective: Telemetry: NSR, 80's Physical Exam: Blood pressure 99/53, pulse 86, temperature 98.1 F (36.7 C), temperature source Oral, resp. rate 16, height 5\' 8"  (1.727 m), weight 103.692 kg (228 lb 9.6 oz), last menstrual period 03/13/2015, SpO2 98 %. Body mass index is 34.77 kg/(m^2). General: Well developed, well nourished, in no acute distress. Head: Normocephalic, atraumatic, sclera non-icteric, no xanthomas, nares are without discharge. Neck: Negative for carotid bruits. JVP 3 cm above the clavicle at 45 degrees Lungs: Mild bibasilar crackles, no rhonchi or wheezes Heart: RRR S1 S2 without murmurs, rubs, or gallops.  Abdomen: Soft, non-tender, non-distended with normoactive bowel sounds. No rebound/guarding. Extremities: No clubbing or cyanosis. 3+ pitting edema  to the bilateral mid-thighs. Distal pedal pulses are 2+ and equal bilaterally. Neuro: Alert and oriented X 3. Moves all extremities spontaneously. Psych:  Responds to questions appropriately with a normal affect.   Intake/Output Summary (Last 24 hours) at 06/22/15 1347 Last data filed at 06/22/15 1112  Gross per 24 hour  Intake   1010 ml  Output    850 ml  Net    160 ml    Inpatient Medications:  . arformoterol  15 mcg Nebulization BID  . aspirin EC  81 mg Oral Daily  . atorvastatin  20 mg Oral q1800  . azithromycin  500 mg Oral Daily  . budesonide (PULMICORT) nebulizer solution  0.25 mg Nebulization BID  . enoxaparin (LOVENOX) injection  40 mg Subcutaneous Q24H  . furosemide  40 mg Intravenous BID  . insulin aspart  0-9 Units Subcutaneous TID AC & HS  . lidocaine  1 patch Transdermal Q24H  . magic mouthwash  10 mL Oral TID  . meropenem (MERREM) IV  1 g Intravenous 3 times per day  . nicotine  21 mg Transdermal Daily  . pantoprazole  40 mg Oral Daily  . potassium chloride  20 mEq Oral BID  . sodium chloride  1 g Oral TID WC  . sucralfate  1 g Oral TID WC & HS   Infusions:    Labs:  Recent Labs  06/20/15 0459  06/21/15 0854 06/22/15 0546  NA 128*  < > 130* 130*  K 3.9  --   --  3.2*  CL 103  --   --  100*  CO2 19*  --   --  22  GLUCOSE 100*  --   --  95  BUN 16  --   --  14  CREATININE 1.00  --   --  0.87  CALCIUM 7.8*  --   --  7.9*  MG  --   --   --  1.8  < > = values in this interval not displayed. No results for input(s): AST, ALT, ALKPHOS, BILITOT, PROT, ALBUMIN in the last 72 hours.  Recent Labs  06/20/15 0459 06/21/15 0538  WBC 10.3 7.7  HGB 9.0* 9.7*  HCT 26.8* 28.7*  MCV 114.6* 115.0*  PLT 242 238   No results for input(s): CKTOTAL, CKMB, TROPONINI in the last 72 hours. Invalid input(s): POCBNP No results for input(s): HGBA1C in the last 72 hours.   Weights: Filed Weights   06/21/15 0622 06/21/15 2107 06/22/15 0500  Weight: 103.556 kg (228  lb 4.8 oz) 100.744 kg (222 lb 1.6 oz) 103.692 kg (228 lb 9.6 oz)     Radiology/Studies:  Exam at   Assessment and Plan   1. Respiratory distress/shortness of breath: Likely due to infection/pulmonary disease.  There is mild pulmonary edema, possibly due to diastolic dysfunction and volume overload from SIADH.   - Antibiotics per primary team - Continue lasix to 40 mg IV BID  2. Acute on chronic diastolic CHF: Her volume status is improving with lasix 40 mg IV bid.  Her BP is low, but she denies lightheadedness.  It appears that ins/outs continue to be poorly assessed. -Echo showing normal LV systolic function with elevated right heart pressures likely secondary to underlying lung disease and diastolic dysfunction. - Continue lasix to 40 mg bid - Strict I/Os and daily weights  3. COPD exacerbation: -Improving -Managed by pulmonary -Antibiotic, nebulizer  4. Hyponatremia: This seems to be stable and is managed by nephrology.  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  She seems to have hypervolemic hyponatremia.  - lasix to 40 mg IV bid as above. - Replete potassium    Signed, Erica Constantino C. Duke Salvia, MD, [facility] 06/22/2015, 1:47 PM

## 2015-06-22 NOTE — Progress Notes (Signed)
[facility] - [facility] at [facility]   PATIENT NAME: Erica [name]    MR#:  [id number]  DATE OF BIRTH:  Jul 22, 1965  SUBJECTIVE:  Had worsening in her lung infiltrates and hypoxia with some hypiotention- so transferred to stepdown. 06/20/15- bronchoscopy is done.   Worsening leg edema, off O2 .  Review of Systems  Constitutional: Negative for fever, chills and weight loss.  HENT: Negative for congestion.   Eyes: Negative for blurred vision and double vision.  Respiratory: Negative for cough, sputum production, shortness of breath and wheezing.   Cardiovascular: Positive for leg swelling. Negative for chest pain, palpitations, orthopnea and PND.  Gastrointestinal: Negative for nausea, vomiting, abdominal pain, diarrhea, constipation and blood in stool.  Genitourinary: Negative for dysuria, urgency, frequency and hematuria.  Musculoskeletal: Negative for falls.  Neurological: Negative for dizziness, tremors, focal weakness and headaches.  Endo/Heme/Allergies: Does not bruise/bleed easily.  Psychiatric/Behavioral: Negative for depression. The patient does not have insomnia.     VITAL SIGNS: Blood pressure 99/53, pulse 86, temperature 98.1 F (36.7 C), temperature source Oral, resp. rate 16, height  (1.727 m), weight 103.692 kg (228 lb 9.6 oz), last menstrual period 03/13/2015, SpO2 98 %.  PHYSICAL EXAMINATION:   GENERAL:  49 y.o.-year-old patient lying in the bed no acute distress  EYES: Pupils equal, round, reactive to light and accommodation. No scleral icterus. Extraocular muscles intact.  HEENT: Head atraumatic, normocephalic. Oropharynx and nasopharynx clear.  NECK:  Supple, no jugular venous distention. No thyroid enlargement, no tenderness. LUNGS:  Bilateral air entry, no wheezing or crackles present. No use of accessory muscles to breathe. CARDIOVASCULAR: S1, S2 normal. No murmurs, rubs, or gallops.  ABDOMEN: Soft, nontender, nondistended. Bowel  sounds present. No organomegaly or mass.  EXTREMITIES: 1+ lower extremity and pedal edema, no cyanosis, or clubbing. Dorsalis pedis pulses are palpable at 1+ NEUROLOGIC: Cranial nerves II through XII are intact. Muscle strength 5/5 in all extremities. Sensation intact. Gait not checked.  PSYCHIATRIC: The patient is alert and oriented x 3. anxious SKIN: No obvious rash, lesion, or ulcer.   ORDERS/RESULTS REVIEWED:   CBC  Recent Labs Lab 06/16/15 0655 06/18/15 0550 06/18/15 1012 06/19/15 0600 06/20/15 0459 06/21/15 0538  WBC 11.5* 10.4  --  12.1* 10.3 7.7  HGB 9.0* 7.5* 9.2* 8.8* 9.0* 9.7*  HCT 26.9* 22.0* 27.6* 25.9* 26.8* 28.7*  PLT 244 199  --  224 242 238  MCV 112.7* 115.6*  --  114.7* 114.6* 115.0*  MCH 37.9* 39.1*  --  38.9* 38.5* 38.8*  MCHC 33.7 33.9  --  33.9 33.6 33.8  RDW 19.9* 20.7*  --  19.9* 20.3* 21.0*   ------------------------------------------------------------------------------------------------------------------  Chemistries   Recent Labs Lab 06/17/15 0800  06/17/15 1229  06/18/15 0550  06/19/15 0600  06/20/15 0459  06/20/15 2053 06/21/15 0132 06/21/15 0538 06/21/15 0854 06/22/15 0546  NA 145  < > 131*  < > 134*  < > 126*  < > 128*  < > 130* 133* 131* 130* 130*  K 4.1  --   --   --  2.8*  --  3.8  --  3.9  --   --   --   --   --  3.2*  CL 122*  --   --   --  117*  --  100*  --  103  --   --   --   --   --  100*  CO2 14*  --   --   --  13*  --  19*  --  19*  --   --   --   --   --  22  GLUCOSE 124*  --   --   --  98  --  135*  --  100*  --   --   --   --   --  95  BUN 19  --   --   --  16  --  18  --  16  --   --   --   --   --  14  CREATININE 1.06*  --   --   --  0.83  --  1.18*  --  1.00  --   --   --   --   --  0.87  CALCIUM 7.3*  --   --   --  5.7*  --  7.8*  --  7.8*  --   --   --   --   --  7.9*  MG  --   --  2.0  --   --   --   --   --   --   --   --   --   --   --  1.8  < > = values in this interval not  displayed. ------------------------------------------------------------------------------------------------------------------ estimated creatinine clearance is 97.5 mL/min (by C-G formula based on Cr of 0.87). ------------------------------------------------------------------------------------------------------------------  Recent Labs  06/19/15 1656 06/20/15 1208  TSH 8.074*  --   T4TOTAL  --  4.7    Cardiac Enzymes No results for input(s): CKMB, TROPONINI, MYOGLOBIN in the last 168 hours.  Invalid input(s): CK ------------------------------------------------------------------------------------------------------------------ Invalid input(s): POCBNP ---------------------------------------------------------------------------------------------------------------  RADIOLOGY: No results found.  ASSESSMENT AND PLAN:  1. Acute respiratory distress due to COPD exacerbation and pneumonia.   Had worsening on 06/17/15 night=- found worsening in infiltrate.  since 06/18/15 on broad spectrum ABx.   Repeat CT - worsening infiltrates.   Taken for bronchoscopy 06/20/15.  improved. Off O2 [place]. Continue nebulizer when necessary. No fever or leukocytosis, Blood culture and the sputum culture negative.  Continue Zithromax and discontinue meropenem, discontinued vancomycin.  2. COPD exacerbation:  Continue DuoNeb's   3. Acute on chronic diastolic heart failure: 2-D echocardiogram showed elevated right heart pressure likely secondary to underlying lung disease with diastolic dysfunction. Increased IV lasix to 40 mg IV twice a day per cardiology consult.   4. 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 Her urine sodium was less than 10. Stable.  Was given hypertonic solution.  Also was given bicarbonate drip, still sodium level dropped further.  Nephrologist agreed with IV lasix. Follow-up BMP.  5. Renal insufficiency, Improved.  unremarkable renal  ultrasound.   6. Acute cystitis: Received levaquin for 3-4 days.  7. Elevated troponin, likely demand ischemia, echocardiogram showed normal ejection fraction.   8. Hyperglycemia. hemoglobin A1c 6.2 9. Tobacco abuse, on Nicotine replacement therapy. 10. Lower back pain: Continue when necessary pain meds  11. C/o abdominal discomfort after eating, and Hx of peptic u.cer disease.    appreciated GI consult. Started emperic PPI and sucralfate.  * Hypokalemia. Potassium supplement, magnesium level is normal.  Management plans discussed with the patient and she is in agreement. Greater than 50% time was spent on coordination of care and face-to-face counseling.  DRUG ALLERGIES:  Allergies  Allergen Reactions  . Tramadol Other (See Comments)    Heart palpatations  . Penicillins Rash    CODE STATUS:  Code Status Orders        Start     Ordered   06/14/15 0202  Full code   Continuous     06/14/15 0201    Advance Directive Documentation        Most Recent Value   Type of Advance Directive  Living will   Pre-existing out of facility DNR order (yellow form or pink MOST form)     "MOST" Form in Place?        TOTAL TIME TAKING CARE OF THIS PATIENT: 43 minutes.   Discussed with nephrology , pulmonology and cardiology team. Shaune Pollack M.D on 06/22/2015 at 2:19 PM  Between 7am to 6pm - Pager - [phone]  After 6pm go to [email or web address] - password [email or web address] [facility]  [facility] [place] [facility]  Office  [phone]  CC: Primary care physician; [facility]

## 2015-06-22 NOTE — Progress Notes (Signed)
[facility]  ROUNDING NOTE   Subjective:  Sodium stable at 130. Potassium currently 3.2. States that she's starting to feel better.   Sitting up in bed.  Objective:  Vital signs in last 24 hours:  Temp:  [97.8 F (36.6 C)-98.1 F (36.7 C)] 98.1 F (36.7 C) (12/04 1343) Pulse Rate:  [81-86] 86 (12/04 1343) Resp:  [16-24] 16 (12/04 1343) BP: (98-104)/(47-54) 99/53 mmHg (12/04 1343) SpO2:  [93 %-98 %] 98 % (12/04 1343) Weight:  [100.744 kg (222 lb 1.6 oz)-103.692 kg (228 lb 9.6 oz)] 103.692 kg (228 lb 9.6 oz) (12/04 0500)  Weight change: -2.812 kg (-6 lb 3.2 oz) Filed Weights   06/21/15 0622 06/21/15 2107 06/22/15 0500  Weight: 103.556 kg (228 lb 4.8 oz) 100.744 kg (222 lb 1.6 oz) 103.692 kg (228 lb 9.6 oz)    Intake/Output: I/O last 3 completed shifts: In: 1520 [P.O.:870; IV Piggyback:650] Out: 550 [Urine:550]   Intake/Output this shift:  Total I/O In: 560 [P.O.:560] Out: 900 [Urine:900]  Physical Exam: General: NAD, sitting up  Head: Normocephalic, atraumatic. Moist oral mucosal membranes  Eyes: Anicteric  Neck: Supple, trachea midline  Lungs:  basilar rales, normal effort   Heart: Regular rate and rhythm  Abdomen:  Soft, nontender, bowel sounds present   Extremities: 1+ peripheral edema.  Neurologic: Nonfocal, moving all four extremities  Skin: No lesions       Basic Metabolic Panel:  Recent Labs Lab 06/17/15 0800  06/17/15 1229  06/18/15 0550  06/19/15 0600  06/20/15 0459  06/20/15 2053 06/21/15 0132 06/21/15 0538 06/21/15 0854 06/22/15 0546  NA 145  < > 131*  < > 134*  < > 126*  < > 128*  < > 130* 133* 131* 130* 130*  K 4.1  --   --   --  2.8*  --  3.8  --  3.9  --   --   --   --   --  3.2*  CL 122*  --   --   --  117*  --  100*  --  103  --   --   --   --   --  100*  CO2 14*  --   --   --  13*  --  19*  --  19*  --   --   --   --   --  22  GLUCOSE 124*  --   --   --  98  --  135*  --  100*  --   --   --   --   --  95  BUN 19  --    --   --  16  --  18  --  16  --   --   --   --   --  14  CREATININE 1.06*  --   --   --  0.83  --  1.18*  --  1.00  --   --   --   --   --  0.87  CALCIUM 7.3*  --   --   --  5.7*  --  7.8*  --  7.8*  --   --   --   --   --  7.9*  MG  --   --  2.0  --   --   --   --   --   --   --   --   --   --   --  1.8  < > = values in this interval not displayed.  Liver Function Tests: No results for input(s): AST, ALT, ALKPHOS, BILITOT, PROT, ALBUMIN in the last 168 hours. No results for input(s): LIPASE, AMYLASE in the last 168 hours. No results for input(s): AMMONIA in the last 168 hours.  CBC:  Recent Labs Lab 06/16/15 0655 06/18/15 0550 06/18/15 1012 06/19/15 0600 06/20/15 0459 06/21/15 0538  WBC 11.5* 10.4  --  12.1* 10.3 7.7  HGB 9.0* 7.5* 9.2* 8.8* 9.0* 9.7*  HCT 26.9* 22.0* 27.6* 25.9* 26.8* 28.7*  MCV 112.7* 115.6*  --  114.7* 114.6* 115.0*  PLT 244 199  --  224 242 238    Cardiac Enzymes: No results for input(s): CKTOTAL, CKMB, CKMBINDEX, TROPONINI in the last 168 hours.  BNP: Invalid input(s): POCBNP  CBG:  Recent Labs Lab 06/21/15 1136 06/21/15 1620 06/21/15 2108 06/22/15 0738 06/22/15 1200  GLUCAP 115* 104* 102* 91 95    Microbiology: Results for orders placed or performed during the hospital encounter of 06/13/15  MRSA PCR Screening     Status: None   Collection Time: 06/14/15  2:01 AM  Result Value Ref Range Status   MRSA by PCR NEGATIVE NEGATIVE Final    Comment:        The GeneXpert MRSA Assay (FDA approved for NASAL specimens only), is one component of a comprehensive MRSA colonization surveillance program. It is not intended to diagnose MRSA infection nor to guide or monitor treatment for MRSA infections.   Culture, blood (routine x 2)     Status: None   Collection Time: 06/14/15  8:56 AM  Result Value Ref Range Status   Specimen Description BLOOD  BLD RIGHT ARM  Final   Special Requests   Final    BOTTLES DRAWN AEROBIC AND ANAEROBIC  7CC  AERO, 4CC ANAERO   Culture NO GROWTH 5 DAYS  Final   Report Status 06/19/2015 FINAL  Final  Culture, blood (routine x 2)     Status: None   Collection Time: 06/14/15  9:04 AM  Result Value Ref Range Status   Specimen Description BLOOD RIGHT ASSIST CONTROL  Final   Special Requests   Final    BOTTLES DRAWN AEROBIC AND ANAEROBIC  4CC AERO, 8CC ANAERO   Culture NO GROWTH 5 DAYS  Final   Report Status 06/19/2015 FINAL  Final  MRSA PCR Screening     Status: None   Collection Time: 06/19/15  4:29 PM  Result Value Ref Range Status   MRSA by PCR NEGATIVE NEGATIVE Final    Comment:        The GeneXpert MRSA Assay (FDA approved for NASAL specimens only), is one component of a comprehensive MRSA colonization surveillance program. It is not intended to diagnose MRSA infection nor to guide or monitor treatment for MRSA infections.   Culture, bal-quantitative     Status: None (Preliminary result)   Collection Time: 06/20/15 11:32 AM  Result Value Ref Range Status   Specimen Description BRONCHIAL ALVEOLAR LAVAGE  Final   Special Requests Normal  Final   Gram Stain RARE WBC SEEN NO ORGANISMS SEEN   Final   Culture NO GROWTH 2 DAYS  Final   Report Status PENDING  Incomplete  Culture, routine-sinus     Status: None (Preliminary result)   Collection Time: 06/20/15 11:32 AM  Result Value Ref Range Status   Specimen Description LUNG  Final   Special Requests Normal  Final   Culture NO GROWTH 2  DAYS  Final   Report Status PENDING  Incomplete  Anaerobic culture     Status: None (Preliminary result)   Collection Time: 06/20/15 11:32 AM  Result Value Ref Range Status   Specimen Description LUNG  Final   Special Requests Normal  Final   Culture NO ANAEROBES ISOLATED  Final   Report Status PENDING  Incomplete    Coagulation Studies: No results for input(s): LABPROT, INR in the last 72 hours.  Urinalysis: No results for input(s): COLORURINE, LABSPEC, PHURINE, GLUCOSEU, HGBUR, BILIRUBINUR,  KETONESUR, PROTEINUR, UROBILINOGEN, NITRITE, LEUKOCYTESUR in the last 72 hours.  Invalid input(s): APPERANCEUR    Imaging: No results found.   Medications:     . arformoterol  15 mcg Nebulization BID  . aspirin EC  81 mg Oral Daily  . atorvastatin  20 mg Oral q1800  . azithromycin  500 mg Oral Daily  . budesonide (PULMICORT) nebulizer solution  0.25 mg Nebulization BID  . enoxaparin (LOVENOX) injection  40 mg Subcutaneous Q24H  . furosemide  40 mg Intravenous BID  . insulin aspart  0-9 Units Subcutaneous TID AC & HS  . lidocaine  1 patch Transdermal Q24H  . magic mouthwash  10 mL Oral TID  . nicotine  21 mg Transdermal Daily  . pantoprazole  40 mg Oral Daily  . potassium chloride  20 mEq Oral BID  . sodium chloride  1 g Oral TID WC  . sucralfate  1 g Oral TID WC & HS   acetaminophen, albuterol, ipratropium-albuterol, LORazepam, nicotine polacrilex, nitroGLYCERIN, ondansetron (ZOFRAN) IV, oxyCODONE-acetaminophen  Assessment/ Plan:  49 y.o. female with a PMHx of COPD, gastric ulcer, osteoarthritis, tobacco abuse, who was admitted to [facility] on 06/13/2015 for evaluation of shortness of breath and cough.  1. Hyponatremia.  Urine Na was very low at < 10.  Hyponatremia treated with hypertonic saline.   2. Oral thrush.  Improved 3. Urinary tract infection. 4.  Metabolic acidosis due to use of hypertonic saline. 5.  Hypokalemia. 6.  Bacterial pneumonia (bilateral)  Plan:  Serum sodium has now stabilized at 130.  She is likely to have ongoing mild hyponatremia secondary to underlying bacterial pneumonia.  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  It appears that her metabolic acidosis is also corrected.  Continue treatment of underlying bacterial pneumonia with azithromycin.  Patient was previously on meropenem as well.  Potassium noted to be low.  Continue potassium and chloride 20 mEq by mouth twice a day.  We will continue to monitor progress.     LOS: 8 Aaradhya Kysar 12/4/20162:38  PM

## 2015-06-22 NOTE — Progress Notes (Signed)
PHARMACIST - PHYSICIAN COMMUNICATION DR:   Chen CONCERNING: Antibiotic IV to Oral Route Change Policy  RECOMMENDATION: This patient is receiving azithromycin by the intravenous route.  Based on criteria approved by the Pharmacy and Therapeutics Committee, the antibiotic(s) is/are being converted to the equivalent oral dose form(s).   DESCRIPTION: These criteria include:  Patient being treated for a respiratory tract infection, urinary tract infection, cellulitis or clostridium difficile associated diarrhea if on metronidazole  The patient is not neutropenic and does not exhibit a GI malabsorption state  The patient is eating (either orally or via tube) and/or has been taking other orally administered medications for a least 24 hours  The patient is improving clinically and has a Tmax < 100.5  If you have questions about this conversion, please contact the Pharmacy Department  []  ( [phone] )  [place] [x]  ( [phone] )   [facility] []  ( [phone]   []  ( [phone]  [facility] []  ( [phone] )  [facility]   Maddalyn Lutze, PharmD  

## 2015-06-23 ENCOUNTER — Encounter: Payer: Self-pay | Admitting: Internal Medicine

## 2015-06-23 LAB — CULTURE, BAL-QUANTITATIVE
CULTURE: NO GROWTH
SPECIAL REQUESTS: NORMAL

## 2015-06-23 LAB — BASIC METABOLIC PANEL
Anion gap: 9 (ref 5–15)
BUN: 11 mg/dL (ref 6–20)
CALCIUM: 8.1 mg/dL — AB (ref 8.9–10.3)
CO2: 24 mmol/L (ref 22–32)
CREATININE: 0.73 mg/dL (ref 0.44–1.00)
Chloride: 97 mmol/L — ABNORMAL LOW (ref 101–111)
GFR calc Af Amer: >60 mL/min (ref >60)
GFR calc non Af Amer: >60 mL/min (ref >60)
GLUCOSE: 89 mg/dL (ref 65–99)
Potassium: 3.1 mmol/L — ABNORMAL LOW (ref 3.5–5.1)
Sodium: 130 mmol/L — ABNORMAL LOW (ref 135–145)

## 2015-06-23 LAB — PROTEIN ELECTRO, RANDOM URINE
ALBUMIN ELP UR: 39.7 %
ALPHA-1-GLOBULIN, U: 2.7 %
Alpha-2-Globulin, U: 8.8 %
Beta Globulin, U: 38.7 %
Gamma Globulin, U: 10 %
M Component, Ur: 28.2 % — ABNORMAL HIGH
TOTAL PROTEIN, URINE-UPE24: 24 mg/dL

## 2015-06-23 LAB — CYTOLOGY - NON PAP

## 2015-06-23 LAB — GLUCOSE, CAPILLARY
Glucose-Capillary: 102 mg/dL — ABNORMAL HIGH (ref 65–99)
Glucose-Capillary: 101 mg/dL — ABNORMAL HIGH (ref 65–99)

## 2015-06-23 LAB — LEGIONELLA PNEUMOPHILA SEROGP 1 UR AG: L. pneumophila Serogp 1 Ur Ag: NEGATIVE

## 2015-06-23 LAB — CULTURE, BAL-QUANTITATIVE W GRAM STAIN

## 2015-06-23 MED ORDER — FUROSEMIDE 40 MG PO TABS: 40.0000 mg | ORAL_TABLET | Freq: Two times a day (BID) | ORAL | Status: DC

## 2015-06-23 MED ORDER — SUCRALFATE 1 G PO TABS: 1.0000 g | ORAL_TABLET | Freq: Three times a day (TID) | ORAL | Status: DC

## 2015-06-23 MED ORDER — ALBUTEROL SULFATE HFA 108 (90 BASE) MCG/ACT IN AERS: 2.0000 | INHALATION_SPRAY | Freq: Four times a day (QID) | RESPIRATORY_TRACT | Status: DC | PRN

## 2015-06-23 MED ORDER — POTASSIUM CHLORIDE CRYS ER 20 MEQ PO TBCR: 20.0000 meq | EXTENDED_RELEASE_TABLET | Freq: Two times a day (BID) | ORAL | Status: DC

## 2015-06-23 MED ORDER — ATORVASTATIN CALCIUM 20 MG PO TABS: 20.0000 mg | ORAL_TABLET | Freq: Every day | ORAL | Status: DC

## 2015-06-23 MED ORDER — POTASSIUM CHLORIDE CRYS ER 20 MEQ PO TBCR
40.0000 meq | EXTENDED_RELEASE_TABLET | Freq: Once | ORAL | Status: AC
  Administered 2015-06-23: 40 meq via ORAL
  Filled 2015-06-23: qty 2

## 2015-06-23 MED ORDER — NYSTATIN 100000 UNIT/GM EX POWD
Freq: Two times a day (BID) | CUTANEOUS | Status: DC
  Administered 2015-06-23: 14:00:00 via TOPICAL
  Filled 2015-06-23: qty 15

## 2015-06-23 MED ORDER — BECLOMETHASONE DIPROPIONATE 40 MCG/ACT IN AERS: 1.0000 | INHALATION_SPRAY | Freq: Two times a day (BID) | RESPIRATORY_TRACT | Status: DC

## 2015-06-23 MED ORDER — NICOTINE 21 MG/24HR TD PT24: 21.0000 mg | MEDICATED_PATCH | Freq: Every day | TRANSDERMAL | Status: DC

## 2015-06-23 MED ORDER — ASPIRIN 81 MG PO TBEC: 81.0000 mg | DELAYED_RELEASE_TABLET | Freq: Every day | ORAL | Status: DC

## 2015-06-23 MED ORDER — PANTOPRAZOLE SODIUM 40 MG PO TBEC: 40.0000 mg | DELAYED_RELEASE_TABLET | Freq: Every day | ORAL | Status: DC

## 2015-06-23 NOTE — Discharge Summary (Signed)
[facility] [place] at [facility]   PATIENT NAME: Erica ChacoDonna Cercone    MR#:  [id number]  DATE OF BIRTH:  June 23, 1966  DATE OF ADMISSION:  06/13/2015 ADMITTING PHYSICIAN: Ihor AustinPavan Pyreddy, MD  DATE OF DISCHARGE: 06/23/2015  3:23 PM  PRIMARY CARE PHYSICIAN: [facility]    ADMISSION DIAGNOSIS:  Hyponatremia [E87.1] COPD, mild (HCC) [J44.9] Hypervolemia, unspecified hypervolemia type [E87.70]   DISCHARGE DIAGNOSIS:  1. Acute respiratory distress due to COPD exacerbation and pneumonia. 2. Acute on chronic diastolic heart failure 3. 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 Acute cystitis 5. DM2. 6.  Elevated troponin, likely demand ischemia. SECONDARY DIAGNOSIS:   Past Medical History  Diagnosis Date  . Arthritis   . COPD (chronic obstructive pulmonary disease) (HCC)   . Gastric ulcer   . OSA on CPAP no ins - no cpap machine for the last 4 years  . Asthma   . Hypertension     HOSPITAL COURSE:   1. Acute respiratory distress due to COPD exacerbation and pneumonia.  Had worsening on 06/17/15 night,  found worsening in infiltrate. since 06/18/15 on broad spectrum ABx.  Repeat CT - worsening infiltrates.  Taken for bronchoscopy 06/20/15. Respiratory distressimproved after bronchoscopy. Off O2 [place]. Continue nebulizer when necessary. No fever or leukocytosis, Blood culture and the sputum culture negative.  She has been treated with Zithromax meropenem, and vancomycin.  meropenem and vancomycin were discontinued.  2. COPD exacerbation: treated with nebulizer. Improved.  3. Acute on chronic diastolic heart failure: 2-D echocardiogram showed elevated right heart pressure likely secondary to underlying lung disease with diastolic dysfunction. Increased lasix to 40 mg IV twice a day per cardiology consult.   4. 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 Her urine sodium was less than 10.  Stable.  Was given hypertonic solution. Also was given bicarbonate drip. Sodium level is improving. Nephrologist agreed with IV lasix. Follow-up BMP as outpatient.  5. Renal insufficiency, Improved. unremarkable renal ultrasound.   6. Acute cystitis: Received levaquin for 3-4 days.  7. Elevated troponin, likely demand ischemia, echocardiogram showed normal ejection fraction.   8. Hyperglycemia, DM2. hemoglobin A1c 6.2 9. Tobacco abuse, on Nicotine replacement therapy. 10. Lower back pain: Continue when necessary pain meds  11. C/o abdominal discomfort after eating, and Hx of peptic u.cer disease.  appreciated GI consult. Started emperic PPI and sucralfate.  * Hypokalemia. Continue Potassium supplement, magnesium level is normal.  DISCHARGE CONDITIONS:   Stable, discharged to home with home health PT and the social worker today.  CONSULTS OBTAINED:  Treatment Team:  Mady HaagensenMunsoor Lateef, MD Antonieta Ibaimothy J Gollan, MD Scot Junobert T Elliott, MD Shane CrutchPradeep Ramachandran, MD Shaune PollackQing Ajai Harville, MD  DRUG ALLERGIES:   Allergies  Allergen Reactions  . Tramadol Other (See Comments)    Heart palpatations  . Penicillins Rash    DISCHARGE MEDICATIONS:   Discharge Medication List as of 06/23/2015  1:53 PM    START taking these medications   Details  aspirin EC 81 MG EC tablet Take 1 tablet (81 mg total) by mouth daily., Starting 06/23/2015, Until Discontinued, Print    atorvastatin (LIPITOR) 20 MG tablet Take 1 tablet (20 mg total) by mouth daily at 6 PM., Starting 06/23/2015, Until Discontinued, Print    furosemide (LASIX) 40 MG tablet Take 1 tablet (40 mg total) by mouth 2 (two) times daily. Hold if SBP<110 or DBP<60, Starting 06/23/2015, Until Discontinued, Print    nicotine (NICODERM CQ - DOSED  IN MG/24 HOURS) 21 mg/24hr patch Place 1 patch (21 mg total) onto the skin daily., Starting 06/23/2015, Until Discontinued, Print    pantoprazole (PROTONIX) 40 MG tablet Take 1 tablet (40 mg total) by mouth  daily., Starting 06/23/2015, Until Discontinued, Print    potassium chloride SA (K-DUR,KLOR-CON) 20 MEQ tablet Take 1 tablet (20 mEq total) by mouth 2 (two) times daily., Starting 06/23/2015, Until Discontinued, Normal    sucralfate (CARAFATE) 1 G tablet Take 1 tablet (1 g total) by mouth 4 (four) times daily -  with meals and at bedtime., Starting 06/23/2015, Until Discontinued, Normal      CONTINUE these medications which have CHANGED   Details  albuterol (PROVENTIL HFA;VENTOLIN HFA) 108 (90 BASE) MCG/ACT inhaler Inhale 2 puffs into the lungs every 6 (six) hours as needed for wheezing or shortness of breath., Starting 06/23/2015, Until Discontinued, Print      CONTINUE these medications which have NOT CHANGED   Details  beclomethasone (QVAR) 40 MCG/ACT inhaler Inhale 1 puff into the lungs 2 (two) times daily., Until Discontinued, Historical Med         DISCHARGE INSTRUCTIONS:    If you experience worsening of your admission symptoms, develop shortness of breath, life threatening emergency, suicidal or homicidal thoughts you must seek medical attention immediately by calling 911 or calling your MD immediately  if symptoms less severe.  You Must read complete instructions/literature along with all the possible adverse reactions/side effects for all the Medicines you take and that have been prescribed to you. Take any new Medicines after you have completely understood and accept all the possible adverse reactions/side effects.   Please note  You were cared for by a hospitalist during your hospital stay. If you have any questions about your discharge medications or the care you received while you were in the hospital after you are discharged, you can call the unit and asked to speak with the hospitalist on call if the hospitalist that took care of you is not available. Once you are discharged, your primary care physician will handle any further medical issues. Please note that NO REFILLS for  any discharge medications will be authorized once you are discharged, as it is imperative that you return to your primary care physician (or establish a relationship with a primary care physician if you do not have one) for your aftercare needs so that they can reassess your need for medications and monitor your lab values.    Today   SUBJECTIVE    Both leg edema.  VITAL SIGNS:  Blood pressure 93/49, pulse 82, temperature 98.2 F (36.8 C), temperature source Oral, resp. rate 20, height 5\' 8"  (1.727 m), weight 100.835 kg (222 lb 4.8 oz), last menstrual period 03/13/2015, SpO2 100 %.  I/O:   Intake/Output Summary (Last 24 hours) at 06/23/15 1641 Last data filed at 06/23/15 1300  Gross per 24 hour  Intake    957 ml  Output   3200 ml  Net  -2243 ml    PHYSICAL EXAMINATION:  GENERAL:  49 y.o.-year-old patient lying in the bed with no acute distress.  EYES: Pupils equal, round, reactive to light and accommodation. No scleral icterus. Extraocular muscles intact.  HEENT: Head atraumatic, normocephalic. Oropharynx and nasopharynx clear.  NECK:  Supple, no jugular venous distention. No thyroid enlargement, no tenderness.  LUNGS: Normal breath sounds bilaterally, no wheezing, rales,rhonchi or crepitation. No use of accessory muscles of respiration.  CARDIOVASCULAR: S1, S2 normal. No murmurs, rubs, or gallops.  ABDOMEN: Soft, non-tender, non-distended. Bowel sounds present. No organomegaly or mass.  EXTREMITIES: bilateral leg edema 1-2 +, no cyanosis, or clubbing.  NEUROLOGIC: Cranial nerves II through XII are intact. Muscle strength 5/5 in all extremities. Sensation intact. Gait not checked.  PSYCHIATRIC: The patient is alert and oriented x 3.  SKIN: No obvious rash, lesion, or ulcer.   DATA REVIEW:   CBC  Recent Labs Lab 06/21/15 0538  WBC 7.7  HGB 9.7*  HCT 28.7*  PLT 238    Chemistries   Recent Labs Lab 06/22/15 0546 06/23/15 0445  NA 130* 130*  K 3.2* 3.1*  CL 100*  97*  CO2 22 24  GLUCOSE 95 89  BUN 14 11  CREATININE 0.87 0.73  CALCIUM 7.9* 8.1*  MG 1.8  --     Cardiac Enzymes No results for input(s): TROPONINI in the last 168 hours.  Microbiology Results  Results for orders placed or performed during the hospital encounter of 06/13/15  MRSA PCR Screening     Status: None   Collection Time: 06/14/15  2:01 AM  Result Value Ref Range Status   MRSA by PCR NEGATIVE NEGATIVE Final    Comment:        The GeneXpert MRSA Assay (FDA approved for NASAL specimens only), is one component of a comprehensive MRSA colonization surveillance program. It is not intended to diagnose MRSA infection nor to guide or monitor treatment for MRSA infections.   Culture, blood (routine x 2)     Status: None   Collection Time: 06/14/15  8:56 AM  Result Value Ref Range Status   Specimen Description BLOOD  BLD RIGHT ARM  Final   Special Requests   Final    BOTTLES DRAWN AEROBIC AND ANAEROBIC  7CC AERO, 4CC ANAERO   Culture NO GROWTH 5 DAYS  Final   Report Status 06/19/2015 FINAL  Final  Culture, blood (routine x 2)     Status: None   Collection Time: 06/14/15  9:04 AM  Result Value Ref Range Status   Specimen Description BLOOD RIGHT ASSIST CONTROL  Final   Special Requests   Final    BOTTLES DRAWN AEROBIC AND ANAEROBIC  4CC AERO, 8CC ANAERO   Culture NO GROWTH 5 DAYS  Final   Report Status 06/19/2015 FINAL  Final  MRSA PCR Screening     Status: None   Collection Time: 06/19/15  4:29 PM  Result Value Ref Range Status   MRSA by PCR NEGATIVE NEGATIVE Final    Comment:        The GeneXpert MRSA Assay (FDA approved for NASAL specimens only), is one component of a comprehensive MRSA colonization surveillance program. It is not intended to diagnose MRSA infection nor to guide or monitor treatment for MRSA infections.   Culture, bal-quantitative     Status: None   Collection Time: 06/20/15 11:32 AM  Result Value Ref Range Status   Specimen Description  BRONCHIAL ALVEOLAR LAVAGE  Final   Special Requests Normal  Final   Gram Stain RARE WBC SEEN NO ORGANISMS SEEN   Final   Culture NO GROWTH 3 DAYS  Final   Report Status 06/23/2015 FINAL  Final  Culture, routine-sinus     Status: None (Preliminary result)   Collection Time: 06/20/15 11:32 AM  Result Value Ref Range Status   Specimen Description LUNG  Final   Special Requests Normal  Final   Culture NO GROWTH 3 DAYS  Final   Report Status PENDING  Incomplete  Anaerobic culture     Status: None (Preliminary result)   Collection Time: 06/20/15 11:32 AM  Result Value Ref Range Status   Specimen Description LUNG  Final   Special Requests Normal  Final   Culture NO ANAEROBES ISOLATED  Final   Report Status PENDING  Incomplete    RADIOLOGY:  No results found.      Management plans discussed with the patient, family and they are in agreement.  CODE STATUS:     Code Status Orders        Start     Ordered   06/14/15 0202  Full code   Continuous     06/14/15 0201    Advance Directive Documentation        Most Recent Value   Type of Advance Directive  Living will   Pre-existing out of facility DNR order (yellow form or pink MOST form)     "MOST" Form in Place?        TOTAL TIME TAKING CARE OF THIS PATIENT: 42 minutes.    Shaune Pollack M.D on 06/23/2015 at 4:41 PM  Between 7am to 6pm - Pager - [phone]  After 6pm go to [email or web address] - password [email or web address] [facility]  [facility]  Office  [phone]  CC: Primary care physician; [facility]

## 2015-06-23 NOTE — Progress Notes (Signed)
PICC and tele were removed. Discharge instructions, follow-up appointments, and prescriptions were provided to the pt. The pt was taken downstairs via wheelchair.

## 2015-06-23 NOTE — Care Management (Addendum)
Follow up PCP set with Dr Verdell Faceracy McLane at [facility] 07/07/15 informed Erica [name] at [facility].

## 2015-06-23 NOTE — Progress Notes (Signed)
[facility]  ROUNDING NOTE   Subjective:   Patient states she is breathing better. Continues to have bilateral edema Walking around room Na 130  Objective:  Vital signs in last 24 hours:  Temp:  [98.1 F (36.7 C)-98.4 F (36.9 C)] 98.2 F (36.8 C) (12/05 0615) Pulse Rate:  [81-86] 82 (12/05 0615) Resp:  [16-20] 20 (12/05 0615) BP: (93-99)/(48-53) 93/49 mmHg (12/05 0615) SpO2:  [95 %-100 %] 100 % (12/05 0800) Weight:  [100.835 kg (222 lb 4.8 oz)] 100.835 kg (222 lb 4.8 oz) (12/05 1610)  Weight change: 0.091 kg (3.2 oz) Filed Weights   06/21/15 2107 06/22/15 0500 06/23/15 0627  Weight: 100.744 kg (222 lb 1.6 oz) 103.692 kg (228 lb 9.6 oz) 100.835 kg (222 lb 4.8 oz)    Intake/Output: I/O last 3 completed shifts: In: 1130 [P.O.:680; IV Piggyback:450] Out: 2900 [Urine:2900]   Intake/Output this shift:  Total I/O In: 717 [P.O.:717] Out: 1750 [Urine:1750]  Physical Exam: General: NAD, walking around room  Head: Normocephalic, atraumatic. Moist oral mucosal membranes  Eyes: Anicteric  Neck: Supple, trachea midline  Lungs:  basilar rales, normal effort   Heart: Regular rate and rhythm  Abdomen:  Soft, nontender, bowel sounds present   Extremities: 1+ peripheral edema.  Neurologic: Nonfocal, moving all four extremities  Skin: No lesions       Basic Metabolic Panel:  Recent Labs Lab 06/17/15 1229  06/18/15 0550  06/19/15 0600  06/20/15 0459  06/21/15 0132 06/21/15 0538 06/21/15 0854 06/22/15 0546 06/23/15 0445  NA 131*  < > 134*  < > 126*  < > 128*  < > 133* 131* 130* 130* 130*  K  --   --  2.8*  --  3.8  --  3.9  --   --   --   --  3.2* 3.1*  CL  --   --  117*  --  100*  --  103  --   --   --   --  100* 97*  CO2  --   --  13*  --  19*  --  19*  --   --   --   --  22 24  GLUCOSE  --   --  98  --  135*  --  100*  --   --   --   --  95 89  BUN  --   --  16  --  18  --  16  --   --   --   --  14 11  CREATININE  --   --  0.83  --  1.18*  --  1.00   --   --   --   --  0.87 0.73  CALCIUM  --   --  5.7*  --  7.8*  --  7.8*  --   --   --   --  7.9* 8.1*  MG 2.0  --   --   --   --   --   --   --   --   --   --  1.8  --   < > = values in this interval not displayed.  Liver Function Tests: No results for input(s): AST, ALT, ALKPHOS, BILITOT, PROT, ALBUMIN in the last 168 hours. No results for input(s): LIPASE, AMYLASE in the last 168 hours. No results for input(s): AMMONIA in the last 168 hours.  CBC:  Recent Labs Lab 06/18/15 0550 06/18/15 1012  06/19/15 0600 06/20/15 0459 06/21/15 0538  WBC 10.4  --  12.1* 10.3 7.7  HGB 7.5* 9.2* 8.8* 9.0* 9.7*  HCT 22.0* 27.6* 25.9* 26.8* 28.7*  MCV 115.6*  --  114.7* 114.6* 115.0*  PLT 199  --  224 242 238    Cardiac Enzymes: No results for input(s): CKTOTAL, CKMB, CKMBINDEX, TROPONINI in the last 168 hours.  BNP: Invalid input(s): POCBNP  CBG:  Recent Labs Lab 06/22/15 1200 06/22/15 1635 06/22/15 2115 06/23/15 0725 06/23/15 1126  GLUCAP 95 94 102* 102* 101*    Microbiology: Results for orders placed or performed during the hospital encounter of 06/13/15  MRSA PCR Screening     Status: None   Collection Time: 06/14/15  2:01 AM  Result Value Ref Range Status   MRSA by PCR NEGATIVE NEGATIVE Final    Comment:        The GeneXpert MRSA Assay (FDA approved for NASAL specimens only), is one component of a comprehensive MRSA colonization surveillance program. It is not intended to diagnose MRSA infection nor to guide or monitor treatment for MRSA infections.   Culture, blood (routine x 2)     Status: None   Collection Time: 06/14/15  8:56 AM  Result Value Ref Range Status   Specimen Description BLOOD  BLD RIGHT ARM  Final   Special Requests   Final    BOTTLES DRAWN AEROBIC AND ANAEROBIC  7CC AERO, 4CC ANAERO   Culture NO GROWTH 5 DAYS  Final   Report Status 06/19/2015 FINAL  Final  Culture, blood (routine x 2)     Status: None   Collection Time: 06/14/15  9:04 AM   Result Value Ref Range Status   Specimen Description BLOOD RIGHT ASSIST CONTROL  Final   Special Requests   Final    BOTTLES DRAWN AEROBIC AND ANAEROBIC  4CC AERO, 8CC ANAERO   Culture NO GROWTH 5 DAYS  Final   Report Status 06/19/2015 FINAL  Final  MRSA PCR Screening     Status: None   Collection Time: 06/19/15  4:29 PM  Result Value Ref Range Status   MRSA by PCR NEGATIVE NEGATIVE Final    Comment:        The GeneXpert MRSA Assay (FDA approved for NASAL specimens only), is one component of a comprehensive MRSA colonization surveillance program. It is not intended to diagnose MRSA infection nor to guide or monitor treatment for MRSA infections.   Culture, bal-quantitative     Status: None   Collection Time: 06/20/15 11:32 AM  Result Value Ref Range Status   Specimen Description BRONCHIAL ALVEOLAR LAVAGE  Final   Special Requests Normal  Final   Gram Stain RARE WBC SEEN NO ORGANISMS SEEN   Final   Culture NO GROWTH 3 DAYS  Final   Report Status 06/23/2015 FINAL  Final  Culture, routine-sinus     Status: None (Preliminary result)   Collection Time: 06/20/15 11:32 AM  Result Value Ref Range Status   Specimen Description LUNG  Final   Special Requests Normal  Final   Culture NO GROWTH 3 DAYS  Final   Report Status PENDING  Incomplete  Anaerobic culture     Status: None (Preliminary result)   Collection Time: 06/20/15 11:32 AM  Result Value Ref Range Status   Specimen Description LUNG  Final   Special Requests Normal  Final   Culture NO ANAEROBES ISOLATED  Final   Report Status PENDING  Incomplete    Coagulation Studies: No  results for input(s): LABPROT, INR in the last 72 hours.  Urinalysis: No results for input(s): COLORURINE, LABSPEC, PHURINE, GLUCOSEU, HGBUR, BILIRUBINUR, KETONESUR, PROTEINUR, UROBILINOGEN, NITRITE, LEUKOCYTESUR in the last 72 hours.  Invalid input(s): APPERANCEUR    Imaging: No results found.   Medications:     . arformoterol  15 mcg  Nebulization BID  . aspirin EC  81 mg Oral Daily  . atorvastatin  20 mg Oral q1800  . azithromycin  500 mg Oral Daily  . budesonide (PULMICORT) nebulizer solution  0.25 mg Nebulization BID  . enoxaparin (LOVENOX) injection  40 mg Subcutaneous Q24H  . furosemide  40 mg Intravenous BID  . insulin aspart  0-9 Units Subcutaneous TID AC & HS  . lidocaine  1 patch Transdermal Q24H  . magic mouthwash  10 mL Oral TID  . nicotine  21 mg Transdermal Daily  . pantoprazole  40 mg Oral Daily  . potassium chloride  20 mEq Oral BID  . sodium chloride  1 g Oral TID WC  . sucralfate  1 g Oral TID WC & HS   acetaminophen, albuterol, ipratropium-albuterol, LORazepam, nicotine polacrilex, nitroGLYCERIN, ondansetron (ZOFRAN) IV, oxyCODONE-acetaminophen  Assessment/ Plan:  49 y.o. female with a PMHx of COPD, gastric ulcer, osteoarthritis, tobacco abuse, who was admitted to [facility] on 06/13/2015 for evaluation of shortness of breath and cough.  1. Hyponatremia.  Urine Na was very low at < 10.  Hyponatremia treated with hypertonic saline.  Secondary to volume overload and congestive heart failure Sodium now improved and stable at 130.  Continue furosemide and salt tablets.   2. Hypokalemia: on furosemide high dose - Agree with oral potassium supplement.   3. Acute exacerbation of congestive heart failure diastolic: echo from 11/26.  - Continue furosemide and fluid restriction - patient to be established with heart failure clinic.   4. Pneumonia:  - Continue azithromycin.    LOS: 9 Zykee Avakian 12/5/201611:37 AM

## 2015-06-23 NOTE — Progress Notes (Signed)
  49 year old female with hyponatremia, pulmonary edema, transferred to the ICU with hypoxia and low blood pressure. COPD exacerbation from acute bronchitis/pneumonia  IMPRESSION:  Acute respiratory failure better-much improved -Review of her chest x-ray from 06/19/2015, report and images, shows new left-sided infiltrate, likely effusion versus atelectasis. -Agree with continued diuresis for likely pulmonary edema. -Status post bronchi on 12/2 with washings and lavage cultures no growth, There was  No evidence of excess secretions seen in the airways   Pneumonia/COPD -abx as prescribed -recommend starting inhaled steroids/LABA with Dulera -albuterol as needed   Lung nodules. -The patient has lung nodule seen on most recent CAT scan, versus masslike infiltrate. Will need a repeat outpatient CAT scan.  Nicotine abuse. -Heavy smoker, discussed smoking cessation.  Subjective: Patient feels much better since admission and will be going home today   Filed Vitals:   06/22/15 2021 06/23/15 0615 06/23/15 0627 06/23/15 0800  BP: 95/48 93/49    Pulse: 81 82    Temp: 98.4 F (36.9 C) 98.2 F (36.8 C)    TempSrc: Oral Oral    Resp: 20 20    Height:      Weight:   222 lb 4.8 oz (100.835 kg)   SpO2: 100% 96%  100%    No overt distress No JVD noted Bibasilar scattered crackles, no wheezes RRR s M NABS No LE edema  BMP Latest Ref Rng 06/23/2015 06/22/2015 06/21/2015  Glucose 65 - 99 mg/dL 89 95 -  BUN 6 - 20 mg/dL 11 14 -  Creatinine 1.610.44 - 1.00 mg/dL 0.960.73 0.450.87 -  Sodium 409135 - 145 mmol/L 130(L) 130(L) 130(L)  Potassium 3.5 - 5.1 mmol/L 3.1(L) 3.2(L) -  Chloride 101 - 111 mmol/L 97(L) 100(L) -  CO2 22 - 32 mmol/L 24 22 -  Calcium 8.9 - 10.3 mg/dL 8.1(L) 7.9(L) -    CBC Latest Ref Rng 06/21/2015 06/20/2015 06/19/2015  WBC 3.6 - 11.0 K/uL 7.7 10.3 12.1(H)  Hemoglobin 12.0 - 16.0 g/dL 8.1(X9.7(L) 9.1(Y9.0(L) 7.8(G8.8(L)  Hematocrit 35.0 - 47.0 % 28.7(L) 26.8(L) 25.9(L)  Platelets 150 - 440 K/uL  238 242 224        The [facility] @ 9:30am.    The Patient requires high complexity decision making for assessment and support, frequent evaluation and titration of therapies.   Patient  satisfied with Plan of action and management. All questions answered  Lucie LeatherKurian David Amairani Shuey, M.D.  [facility]  Medical Director [facility] Medical Director [facility] Card[facility] Department

## 2015-06-23 NOTE — Discharge Instructions (Signed)
Heart Failure Clinic appointment on July 16, 2015 at 11:00am with Clarisa Kindredina Hackney, FNP. Please call [phone] to reschedule.   Heart healthy and ADA diet. Activity as tolerated. HHPT, Child psychotherapistsocial worker. Smoking cessation.

## 2015-06-23 NOTE — Progress Notes (Signed)
Initial appointment made at the Heart Failure Clinic on July 16, 2015 at 11:00am. Thank you.

## 2015-06-23 NOTE — Progress Notes (Signed)
Pt noted to have red, raw area on bilateral legs near groin. Pt states it is painful. Barrier cream was applied by nursing students. Pt encouraged to show physician when rounding. Will notify MD as well when he makes round.

## 2015-06-23 NOTE — Care Management (Signed)
Spoke with patient for discharge planning patient is from home with husband and has walker. Husband does drive. Patient was working up until time of hospitalization. Discussed home health with patient and offered choice. No preference of agencies per patient. [facility] placed with [facility]. Patient will need primary care provider, attempting to setup with Dr Dan HumphreysWalker.

## 2015-06-24 LAB — CULTURE, ROUTINE-SINUS
CULTURE: NO GROWTH
Special Requests: NORMAL

## 2015-06-24 LAB — ANAEROBIC CULTURE: SPECIAL REQUESTS: NORMAL

## 2015-06-30 DIAGNOSIS — I509 Heart failure, unspecified: Secondary | ICD-10-CM | POA: Insufficient documentation

## 2015-07-04 ENCOUNTER — Ambulatory Visit (INDEPENDENT_AMBULATORY_CARE_PROVIDER_SITE_OTHER): Payer: 59 | Admitting: Pulmonary Disease

## 2015-07-04 ENCOUNTER — Encounter: Payer: Self-pay | Admitting: Pulmonary Disease

## 2015-07-04 VITALS — BP 132/60 | HR 98 | Ht 68.0 in | Wt 180.8 lb

## 2015-07-04 DIAGNOSIS — F17200 Nicotine dependence, unspecified, uncomplicated: Secondary | ICD-10-CM

## 2015-07-04 DIAGNOSIS — J449 Chronic obstructive pulmonary disease, unspecified: Secondary | ICD-10-CM | POA: Diagnosis not present

## 2015-07-04 DIAGNOSIS — Z720 Tobacco use: Secondary | ICD-10-CM

## 2015-07-04 DIAGNOSIS — R918 Other nonspecific abnormal finding of lung field: Secondary | ICD-10-CM

## 2015-07-04 DIAGNOSIS — J189 Pneumonia, unspecified organism: Secondary | ICD-10-CM | POA: Diagnosis not present

## 2015-07-04 NOTE — Patient Instructions (Signed)
Continue with your excellent work on smoking cessation. Continue Qvar and as needed albuterol  Follow up in 4-6 weeks with Chest Xray

## 2015-07-04 NOTE — Progress Notes (Signed)
POST HOSPITAL PULMONARY FOLLOW UP  HOSPITAL SUMMARY: Hospitalized 11/25-12/05/16 with the following admission diagnoses: 1. Dyspnea rule out CHF 2. Chest tightness 3. Unstable angina 4. Hyponatremia 5. Urinary tract infection 6. COPD  And the following DC diagnoses: 1. Acute respiratory distress due to COPD exacerbation and pneumonia. 2. Acute on chronic diastolic heart failure 3. 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 Acute cystitis 5. DM2. 6. Elevated troponin, likely demand ischemia.  She was seen by Pulmonary medicine during that hopsitalization.  Initial CXR was clear. Subsequent CXR revealed increasing L sided opacity CT chest 06/19/15 revealed L>R scattered GGOs with a couple of very small lung nodules bilateral  PROBLEMS: Smoker COPD - previously seen by Dr Meredeth IdeFleming in remote past Hospitalization 11/25-12/05/16 for COPD exacerbation and possible CAP Multiple 0.5 - 0.6 mm bilateral lung nodules - indeterminant  INTERVAL HISTORY:  SUBJ: Feels "100%" better. No new respiratory complaints. No significant dyspnea, cough, sputum, hemoptysis. LE edema has resolved. Has F/U arranged with primary MD, Renal, Cardiology next week. Presently maintained on Qvar and PRN albuterol. She is not needing albuterol at all. Has not smoked since discharge. Is not wearing nicotine patch   OBJ: Filed Vitals:   07/04/15 0937  BP: 132/60  Pulse: 98  Height: 5\' 8"  (1.727 m)  Weight: 180 lb 12.8 oz (82.01 kg)  SpO2: 93%   NAD HEENT: very poor dentition, NAD No JVD, LAN Normal percussion note, no wheezing or crackles RRR s M NABS No C/C/E  DATA: No new CXR or lab data  IMPRESSION: Chronic obstructive pulmonary disease, chronic bronchitis Smoker- has been abstinent since 06/13/15 CAP, clinically resolved Lung nodules - these are incidentally found on CT chest. They are very small and likely insignificant. Nonetheless, they probably warrant at least one follow up  CT chest in 3 months   PLAN: Cont scheduled Qvar Cont PRN albuterol I congratulated her on her success at smoking cessation and we discussed the risk of and strategies to prevent relapse ROV 4-6 weeks with CXR to ensure resolution of L sided infiltrate Will need repeat CT chest in 3-4 months (around March 2017)  Merwyn Katosavid B Alexcia Schools, MD [facility]

## 2015-07-04 NOTE — Addendum Note (Signed)
Addended by: Billy FischerSIMONDS, Sal Spratley B on: 07/04/2015 03:28 PM   Modules accepted: Orders

## 2015-07-07 NOTE — Progress Notes (Signed)
error    This encounter was created in error - please disregard.

## 2015-07-08 ENCOUNTER — Encounter: Payer: 59 | Admitting: Cardiovascular Disease

## 2015-07-16 ENCOUNTER — Encounter: Payer: Self-pay | Admitting: Family

## 2015-07-16 ENCOUNTER — Ambulatory Visit: Payer: 59 | Attending: Family | Admitting: Family

## 2015-07-16 VITALS — BP 129/82 | HR 97 | Resp 18 | Ht 68.0 in | Wt 169.0 lb

## 2015-07-16 DIAGNOSIS — J449 Chronic obstructive pulmonary disease, unspecified: Secondary | ICD-10-CM | POA: Diagnosis not present

## 2015-07-16 DIAGNOSIS — I110 Hypertensive heart disease with heart failure: Secondary | ICD-10-CM | POA: Insufficient documentation

## 2015-07-16 DIAGNOSIS — R000 Tachycardia, unspecified: Secondary | ICD-10-CM | POA: Diagnosis not present

## 2015-07-16 DIAGNOSIS — Z79899 Other long term (current) drug therapy: Secondary | ICD-10-CM | POA: Diagnosis not present

## 2015-07-16 DIAGNOSIS — F17200 Nicotine dependence, unspecified, uncomplicated: Secondary | ICD-10-CM | POA: Insufficient documentation

## 2015-07-16 DIAGNOSIS — Z880 Allergy status to penicillin: Secondary | ICD-10-CM | POA: Diagnosis not present

## 2015-07-16 DIAGNOSIS — Z87891 Personal history of nicotine dependence: Secondary | ICD-10-CM | POA: Insufficient documentation

## 2015-07-16 DIAGNOSIS — G4733 Obstructive sleep apnea (adult) (pediatric): Secondary | ICD-10-CM | POA: Diagnosis not present

## 2015-07-16 DIAGNOSIS — I5032 Chronic diastolic (congestive) heart failure: Secondary | ICD-10-CM

## 2015-07-16 DIAGNOSIS — Z720 Tobacco use: Secondary | ICD-10-CM

## 2015-07-16 DIAGNOSIS — J45909 Unspecified asthma, uncomplicated: Secondary | ICD-10-CM | POA: Diagnosis not present

## 2015-07-16 DIAGNOSIS — Z9889 Other specified postprocedural states: Secondary | ICD-10-CM | POA: Insufficient documentation

## 2015-07-16 DIAGNOSIS — Z888 Allergy status to other drugs, medicaments and biological substances status: Secondary | ICD-10-CM | POA: Diagnosis not present

## 2015-07-16 DIAGNOSIS — Z7982 Long term (current) use of aspirin: Secondary | ICD-10-CM | POA: Diagnosis not present

## 2015-07-16 DIAGNOSIS — Z8719 Personal history of other diseases of the digestive system: Secondary | ICD-10-CM | POA: Insufficient documentation

## 2015-07-16 MED ORDER — POTASSIUM CHLORIDE CRYS ER 20 MEQ PO TBCR: 20.0000 meq | EXTENDED_RELEASE_TABLET | Freq: Every day | ORAL | Status: DC

## 2015-07-16 MED ORDER — FUROSEMIDE 40 MG PO TABS: 40.0000 mg | ORAL_TABLET | Freq: Every day | ORAL | Status: DC

## 2015-07-16 NOTE — Progress Notes (Signed)
Subjective:    Patient ID: Erica [name], female    DOB: 1965-08-27, 49 y.o.   MRN: [id number]  Congestive Heart Failure Presents for initial visit. The disease course has been improving. Associated symptoms include fatigue, palpitations and shortness of breath (mild). Pertinent negatives include no abdominal pain, chest pain, chest pressure, edema or near-syncope. The symptoms have been improving. Past treatments include salt and fluid restriction and exercise. The treatment provided moderate relief. Compliance with prior treatments has been good. Her past medical history is significant for chronic lung disease and HTN. Compliance with total regimen is 76-100%.  Palpitations  This is a recurrent problem. The current episode started 1 to 4 weeks ago. The problem occurs intermittently. The problem has been unchanged. Nothing aggravates the symptoms. Associated symptoms include anxiety (with shortness of breath), numbness (in hands & feet) and shortness of breath (mild). Pertinent negatives include no chest fullness, chest pain, coughing, dizziness, nausea, near-syncope or vomiting. She has tried breathing exercises for the symptoms. The treatment provided mild relief. Risk factors include smoking/tobacco exposure. Her past medical history is significant for anxiety.   Past Medical History  Diagnosis Date  . Arthritis   . COPD (chronic obstructive pulmonary disease) (HCC)   . Gastric ulcer   . OSA on CPAP no ins - no cpap machine for the last 4 years  . Asthma   . Hypertension     Past Surgical History  Procedure Laterality Date  . Knee surgery Left     8 knee surgeries  . Flexible bronchoscopy N/A 06/20/2015    Procedure: FLEXIBLE BRONCHOSCOPY;  Surgeon: Shane Crutch, MD;  Location: [facility] ORS;  Service: Pulmonary;  Laterality: N/A;    Family History  Problem Relation Age of Onset  . Lung cancer Father   . Breast cancer Mother   . Diabetes Maternal Grandmother     Social  History  Substance Use Topics  . Smoking status: Former Smoker -- 1.50 packs/day for 36 years    Types: Cigarettes    Quit date: 06/13/2015  . Smokeless tobacco: Never Used  . Alcohol Use: No     Comment: occasionaly    Allergies  Allergen Reactions  . Tramadol Other (See Comments)    Heart palpatations  . Penicillins Rash    Prior to Admission medications   Medication Sig Start Date End Date Taking? Authorizing Provider  albuterol (PROVENTIL HFA;VENTOLIN HFA) 108 (90 BASE) MCG/ACT inhaler Inhale 2 puffs into the lungs every 6 (six) hours as needed for wheezing or shortness of breath. 06/23/15  Yes Shaune Pollack, MD  aspirin EC 81 MG EC tablet Take 1 tablet (81 mg total) by mouth daily. 06/23/15  Yes Shaune Pollack, MD  atorvastatin (LIPITOR) 20 MG tablet Take 1 tablet (20 mg total) by mouth daily at 6 PM. 06/23/15  Yes Shaune Pollack, MD  beclomethasone (QVAR) 40 MCG/ACT inhaler Inhale 1 puff into the lungs 2 (two) times daily. 06/23/15  Yes Shaune Pollack, MD  furosemide (LASIX) 40 MG tablet Take 1 tablet (40 mg total) by mouth daily. Hold if SBP<110 or DBP<60 07/16/15  Yes Delma Freeze, FNP  pantoprazole (PROTONIX) 40 MG tablet Take 1 tablet (40 mg total) by mouth daily. 06/23/15  Yes Shaune Pollack, MD  potassium chloride SA (K-DUR,KLOR-CON) 20 MEQ tablet Take 1 tablet (20 mEq total) by mouth daily. 07/16/15  Yes Delma Freeze, FNP  sucralfate (CARAFATE) 1 G tablet Take 1 tablet (1 g total) by mouth 4 (four) times  daily -  with meals and at bedtime. 06/23/15  Yes Shaune PollackQing Chen, MD      Review of Systems  Constitutional: Positive for fatigue. Negative for appetite change.  HENT: Negative for congestion, postnasal drip and sore throat.   Eyes: Negative.   Respiratory: Positive for shortness of breath (mild). Negative for cough, chest tightness and wheezing.   Cardiovascular: Positive for palpitations. Negative for chest pain, leg swelling and near-syncope.  Gastrointestinal: Negative for nausea, vomiting,  abdominal pain and abdominal distention.  Endocrine: Negative.   Genitourinary: Negative.   Musculoskeletal: Positive for back pain (chronic. bulging disc in lower back) and arthralgias (both knees).  Skin: Negative.   Allergic/Immunologic: Negative.   Neurological: Positive for numbness (in hands & feet). Negative for dizziness, light-headedness and headaches.  Hematological: Negative for adenopathy. Does not bruise/bleed easily.  Psychiatric/Behavioral: Positive for sleep disturbance (sleeping in recliner). Negative for dysphoric mood. The patient is nervous/anxious (with shortness of breath).        Objective:   Physical Exam  Constitutional: She is oriented to person, place, and time. She appears well-developed and well-nourished.  HENT:  Head: Normocephalic and atraumatic.  Eyes: Conjunctivae are normal. Pupils are equal, round, and reactive to light.  Neck: Normal range of motion. Neck supple.  Cardiovascular: Regular rhythm.  Tachycardia present.   Pulmonary/Chest: Effort normal. She has no wheezes. She has no rales.  Abdominal: Soft. She exhibits no distension. There is no tenderness.  Musculoskeletal: She exhibits no edema or tenderness.  Neurological: She is alert and oriented to person, place, and time.  Skin: Skin is warm and dry.  Psychiatric: She has a normal mood and affect. Her behavior is normal. Thought content normal.  Nursing note and vitals reviewed.   BP 129/82 mmHg  Pulse 97  Resp 18  Ht 5\' 8"  (1.727 m)  Wt 169 lb (76.658 kg)  BMI 25.70 kg/m2  SpO2 99%  LMP 03/13/2015 (Approximate)       Assessment & Plan:  1: Chronic heart failure with preserved ejection fraction- Patient presents with fatigue that is improving along with a mild amount of shortness of breath. She says that she really didn't experience any symptoms upon walking into the office today. She denies any swelling in her legs. She is already weighing herself daily and says that she's noticed  a gradual weight loss since leaving the hospital. Instructed her to call for an overnight weight gain of >2 pounds or a weekly weight gain of >5 pounds. She does add "a pinch" of salt when she cooks and does read food labels. Encouraged her to not add any salt to her food and to closely follow a 2000mg  sodium diet. Based on exam, will decrease her diuretic and potassium to just once daily. Did instruct her though that if her weight goes up per above parameters or she begins to notice swelling that she could take her diuretic/potassium a 2nd time in the day. 2: COPD- No wheezing heard today. She does have inhalers that she uses. 3: Obstructive sleep apnea- She says that she sleeps in the recliner because of her sleep apnea. She says that she tried wearing CPAP and even oxygen and had panic attacks while wearing both of them. 4: Tachycardia- Discussed possibly adding low dose metoprolol if her heart rate remains elevated. Sees cardiology on 08/26/15. 5: Tobacco use- Patient has not smoked any cigarettes since 06/13/15 and says that she doesn't plan on resuming smoking. Encouraged her to continue with her cessation  and this was discussed for 3 minutes with her.  Return in 1 month or sooner for any questions/problems before then.

## 2015-07-16 NOTE — Patient Instructions (Addendum)
Continue weighing daily and call for an overnight weight gain of > 2 pounds or a weekly weight gain of >5 pounds.  Decrease fluid pill and potassium tablets to once daily. If weight goes up per above or you develop swelling in legs, take an extra fluid pill and potassium.

## 2015-07-31 DIAGNOSIS — G629 Polyneuropathy, unspecified: Secondary | ICD-10-CM | POA: Insufficient documentation

## 2015-07-31 DIAGNOSIS — M5380 Other specified dorsopathies, site unspecified: Secondary | ICD-10-CM | POA: Insufficient documentation

## 2015-07-31 DIAGNOSIS — G4700 Insomnia, unspecified: Secondary | ICD-10-CM | POA: Insufficient documentation

## 2015-08-04 ENCOUNTER — Telehealth: Payer: Self-pay

## 2015-08-04 LAB — ACID FAST SMEAR+CULTURE W/RFLX (ARMC ONLY)
ACID FAST CULTURE: NEGATIVE
ACID FAST SMEAR: NEGATIVE

## 2015-08-04 NOTE — Telephone Encounter (Signed)
Received records request Disability Determination Services, forwarded to [employer] for processing.  

## 2015-08-12 ENCOUNTER — Ambulatory Visit: Payer: 59 | Admitting: Pulmonary Disease

## 2015-08-13 ENCOUNTER — Encounter: Payer: Self-pay | Admitting: *Deleted

## 2015-08-18 ENCOUNTER — Telehealth: Payer: Self-pay | Admitting: Family

## 2015-08-18 ENCOUNTER — Ambulatory Visit: Payer: 59 | Admitting: Family

## 2015-08-18 NOTE — Telephone Encounter (Signed)
Patient did not show for [facility]. Will attempt to reschedule.

## 2015-08-19 ENCOUNTER — Telehealth: Payer: Self-pay | Admitting: *Deleted

## 2015-08-19 NOTE — Telephone Encounter (Signed)
Received records request Disability Determination services  , forwarded to [facility] for processing 08/19/15.

## 2015-08-26 ENCOUNTER — Ambulatory Visit: Payer: Self-pay | Admitting: Cardiovascular Disease

## 2015-08-26 ENCOUNTER — Encounter: Payer: Self-pay | Admitting: *Deleted

## 2015-10-03 ENCOUNTER — Emergency Department
Admission: EM | Admit: 2015-10-03 | Discharge: 2015-10-03 | Disposition: A | Discharge status: Home / Self Care | Payer: Medicaid Other | Attending: Emergency Medicine | Admitting: Emergency Medicine

## 2015-10-03 ENCOUNTER — Encounter: Payer: Self-pay | Admitting: Emergency Medicine

## 2015-10-03 ENCOUNTER — Emergency Department: Payer: Medicaid Other

## 2015-10-03 DIAGNOSIS — Z79899 Other long term (current) drug therapy: Secondary | ICD-10-CM | POA: Diagnosis not present

## 2015-10-03 DIAGNOSIS — I270 Primary pulmonary hypertension: Secondary | ICD-10-CM | POA: Diagnosis not present

## 2015-10-03 DIAGNOSIS — Z87891 Personal history of nicotine dependence: Secondary | ICD-10-CM | POA: Diagnosis not present

## 2015-10-03 DIAGNOSIS — Z7951 Long term (current) use of inhaled steroids: Secondary | ICD-10-CM | POA: Diagnosis not present

## 2015-10-03 DIAGNOSIS — J441 Chronic obstructive pulmonary disease with (acute) exacerbation: Secondary | ICD-10-CM

## 2015-10-03 DIAGNOSIS — J45909 Unspecified asthma, uncomplicated: Secondary | ICD-10-CM | POA: Diagnosis not present

## 2015-10-03 DIAGNOSIS — F17210 Nicotine dependence, cigarettes, uncomplicated: Secondary | ICD-10-CM

## 2015-10-03 DIAGNOSIS — R05 Cough: Secondary | ICD-10-CM | POA: Diagnosis present

## 2015-10-03 DIAGNOSIS — M179 Osteoarthritis of knee, unspecified: Secondary | ICD-10-CM | POA: Diagnosis not present

## 2015-10-03 DIAGNOSIS — E871 Hypo-osmolality and hyponatremia: Secondary | ICD-10-CM | POA: Insufficient documentation

## 2015-10-03 DIAGNOSIS — I5032 Chronic diastolic (congestive) heart failure: Secondary | ICD-10-CM | POA: Diagnosis not present

## 2015-10-03 DIAGNOSIS — Z7982 Long term (current) use of aspirin: Secondary | ICD-10-CM | POA: Insufficient documentation

## 2015-10-03 DIAGNOSIS — J069 Acute upper respiratory infection, unspecified: Secondary | ICD-10-CM

## 2015-10-03 MED ORDER — IPRATROPIUM-ALBUTEROL 0.5-2.5 (3) MG/3ML IN SOLN
3.0000 mL | Freq: Once | RESPIRATORY_TRACT | Status: AC
  Administered 2015-10-03: 3 mL via RESPIRATORY_TRACT

## 2015-10-03 MED ORDER — PREDNISONE 10 MG PO TABS: ORAL_TABLET | ORAL | Status: DC

## 2015-10-03 MED ORDER — MAGIC MOUTHWASH: 5.0000 mL | Freq: Four times a day (QID) | ORAL | Status: DC

## 2015-10-03 MED ORDER — IPRATROPIUM-ALBUTEROL 0.5-2.5 (3) MG/3ML IN SOLN
RESPIRATORY_TRACT | Status: AC
  Filled 2015-10-03: qty 3

## 2015-10-03 NOTE — Discharge Instructions (Signed)
[facility] Continue inhalers, begin prednisone today.  Begin magic mouth wash and continue rinsing your mouth out after inhalers Stop smoking

## 2015-10-03 NOTE — ED Provider Notes (Signed)
[facility] Emergency Department Provider Note  ____________________________________________  Time seen: Approximately 10:55 AM  I have reviewed the triage vital signs and the nursing notes.   HISTORY  Chief Complaint Cough and Nasal Congestion   HPI Erica [name] is a 50 y.o. female is here with complaint of nonproductive cough with occasional fever for the last 2 weeks. Patient states that she has had minimal relief with her inhalers, Qvar, albuterol. Patient does not have much fever she's had it she does not have a thermometer but states she has felt hot. She has a history of COPD and asthma. Patient continues to smoke.   Past Medical History  Diagnosis Date  . Arthritis   . COPD (chronic obstructive pulmonary disease) (HCC)   . Gastric ulcer   . OSA on CPAP no ins - no cpap machine for the last 4 years  . Asthma   . Hypertension     Patient Active Problem List   Diagnosis Date Noted  . Chronic diastolic heart failure (HCC) 07/16/2015  . Tobacco abuse 07/16/2015  . Obstructive sleep apnea 07/16/2015  . Tachycardia 07/16/2015  . COPD, mild (HCC)   . Renal insufficiency   . Pulmonary HTN (HCC)   . Hyponatremia 06/14/2015  . Shortness of breath   . Swelling   . Cough   . COPD (chronic obstructive pulmonary disease) with acute bronchitis [facility](HCC)     Past Surgical History  Procedure Laterality Date  . Knee surgery Left     8 knee surgeries  . Flexible bronchoscopy N/A 06/20/2015    Procedure: FLEXIBLE BRONCHOSCOPY;  Surgeon: Shane CrutchPradeep Ramachandran, MD;  Location: [facility] ORS;  Service: Pulmonary;  Laterality: N/A;    Current Outpatient Rx  Name  Route  Sig  Dispense  Refill  . albuterol (PROVENTIL HFA;VENTOLIN HFA) 108 (90 BASE) MCG/ACT inhaler   Inhalation   Inhale 2 puffs into the lungs every 6 (six) hours as needed for wheezing or shortness of breath.   6.7 g   0   . aspirin EC 81 MG EC tablet   Oral   Take 1 tablet (81 mg total) by mouth  daily.   30 tablet   0   . atorvastatin (LIPITOR) 20 MG tablet   Oral   Take 1 tablet (20 mg total) by mouth daily at 6 PM.   30 tablet   0   . beclomethasone (QVAR) 40 MCG/ACT inhaler   Inhalation   Inhale 1 puff into the lungs 2 (two) times daily.   1 Inhaler   0   . furosemide (LASIX) 40 MG tablet   Oral   Take 1 tablet (40 mg total) by mouth daily. Hold if SBP<110 or DBP<60   30 tablet   0   . magic mouthwash SOLN   Oral   Take 5 mLs by mouth 4 (four) times daily.   100 mL   0   . pantoprazole (PROTONIX) 40 MG tablet   Oral   Take 1 tablet (40 mg total) by mouth daily.   30 tablet   0   . potassium chloride SA (K-DUR,KLOR-CON) 20 MEQ tablet   Oral   Take 1 tablet (20 mEq total) by mouth daily.   30 tablet   0   . predniSONE (DELTASONE) 10 MG tablet      Take 6 tablets  today, on day 2 take 5 tablets, day 3 take 4 tablets, day 4 take 3 tablets, day 5 take  2 tablets and 1 tablet the last day   21 tablet   0   . sucralfate (CARAFATE) 1 G tablet   Oral   Take 1 tablet (1 g total) by mouth 4 (four) times daily -  with meals and at bedtime.   120 tablet   0     Allergies Tramadol and Penicillins  Family History  Problem Relation Age of Onset  . Lung cancer Father   . Breast cancer Mother   . Diabetes Maternal Grandmother     Social History Social History  Substance Use Topics  . Smoking status: Former Smoker -- 1.50 packs/day for 36 years    Types: Cigarettes    Quit date: 06/13/2015  . Smokeless tobacco: Never Used  . Alcohol Use: No     Comment: occasionaly    Review of Systems Constitutional: Positive fever/chills Cardiovascular: Denies chest pain. Respiratory: Denies shortness of breath. Positive cough. Gastrointestinal: No abdominal pain.  No nausea, no vomiting.  Positive diarrhea.  No constipation. Genitourinary: Negative for dysuria. Musculoskeletal: Negative for back pain. Skin: Negative for rash. Neurological: Negative for  headaches, focal weakness or numbness.  10-point ROS otherwise negative.  ____________________________________________   PHYSICAL EXAM:  VITAL SIGNS: ED Triage Vitals  Enc Vitals Group     BP 10/03/15 0942 164/86 mmHg     Pulse Rate 10/03/15 0942 80     Resp 10/03/15 0942 20     Temp 10/03/15 0942 98 F (36.7 C)     Temp Source 10/03/15 0942 Oral     SpO2 10/03/15 0942 100 %     Weight 10/03/15 0942 178 lb (80.74 kg)     Height 10/03/15 0942  (1.727 m)     Head Cir --      Peak Flow --      Pain Score --      Pain Loc --      Pain Edu? --      Excl. in GC? --     Constitutional: Alert and oriented. Well appearing and in no acute distress. Eyes: Conjunctivae are normal. PERRL. EOMI. Head: Atraumatic. Nose: Mild congestion/rhinnorhea. Mouth/Throat: Mucous membranes are moist.  Oropharynx non-erythematous. Neck: No stridor.   Hematological/Lymphatic/Immunilogical: No cervical lymphadenopathy. Cardiovascular: Normal rate, regular rhythm. Grossly normal heart sounds.  Good peripheral circulation. Respiratory: Normal respiratory effort.  No retractions. Lungs poor inspiratory effort on patient but no rales, rhonchi or wheezing was noted. Patient is able talk in complete sentences without any difficulty. Gastrointestinal: Soft and nontender. No distention.  Musculoskeletal: No lower extremity tenderness nor edema.  No joint effusions. Neurologic:  Normal speech and language. No gross focal neurologic deficits are appreciated. No gait instability. Skin:  Skin is warm, dry and intact. No rash noted. Psychiatric: Mood and affect are normal. Speech and behavior are normal.  ____________________________________________   LABS (all labs ordered are listed, but only abnormal results are displayed)  Labs Reviewed - No data to display  RADIOLOGY  Per radiologist chest x-ray showed no acute cardiopulmonary  disease. ____________________________________________   PROCEDURES  Procedure(s) performed: None  Critical Care performed: No  ____________________________________________   INITIAL IMPRESSION / ASSESSMENT AND PLAN / ED COURSE  Pertinent labs & imaging results that were available during my care of the patient were reviewed by me and considered in my medical decision making (see chart for details).  Patient was started on prednisone 60 mg 6 day taper, Magic mouthwash as needed as she feels as though she  may have the beginnings of a yeast infection although it wasn't seen. Patient has a history of thrush. She is encouraged to continue rinsing her mouth out after using her inhalers. She is to follow-up with [facility] if any continued problems. ____________________________________________   FINAL CLINICAL IMPRESSION(S) / ED DIAGNOSES  Final diagnoses:  Acute upper respiratory infection  Chronic obstructive pulmonary disease with acute exacerbation (HCC)  Cigarette smoker      Tommi Rumps, PA-C 10/03/15 1227  Jene Every, MD 10/03/15 1327

## 2015-10-03 NOTE — ED Notes (Signed)
States she developed cough congestion with occasional fever for the past 2 weeks  also having some discomfort to chest with cough  Min relief with the use of inhalers

## 2015-10-03 NOTE — ED Notes (Signed)
Pt presents with dry cough for a couple of weeks now, nasal congestion and chest hurts when coughing.

## 2015-11-25 ENCOUNTER — Ambulatory Visit
Admission: RE | Admit: 2015-11-25 | Discharge: 2015-11-25 | Disposition: A | Discharge status: Home / Self Care | Payer: Disability Insurance | Source: Ambulatory Visit | Attending: Obstetrics and Gynecology | Admitting: Obstetrics and Gynecology

## 2015-11-25 ENCOUNTER — Other Ambulatory Visit: Payer: Self-pay | Admitting: Obstetrics and Gynecology

## 2015-11-25 DIAGNOSIS — M5136 Other intervertebral disc degeneration, lumbar region: Secondary | ICD-10-CM | POA: Diagnosis not present

## 2015-11-25 DIAGNOSIS — M5137 Other intervertebral disc degeneration, lumbosacral region: Secondary | ICD-10-CM | POA: Insufficient documentation

## 2015-11-25 DIAGNOSIS — M545 Low back pain: Secondary | ICD-10-CM | POA: Insufficient documentation

## 2015-11-25 DIAGNOSIS — R609 Edema, unspecified: Secondary | ICD-10-CM

## 2015-11-25 DIAGNOSIS — M25462 Effusion, left knee: Secondary | ICD-10-CM | POA: Insufficient documentation

## 2015-11-25 DIAGNOSIS — M170 Bilateral primary osteoarthritis of knee: Secondary | ICD-10-CM | POA: Diagnosis not present

## 2015-11-25 DIAGNOSIS — M25461 Effusion, right knee: Secondary | ICD-10-CM | POA: Diagnosis present

## 2016-02-19 ENCOUNTER — Other Ambulatory Visit: Payer: Self-pay | Admitting: Family Medicine

## 2016-02-19 DIAGNOSIS — Z0000 Encounter for general adult medical examination without abnormal findings: Secondary | ICD-10-CM

## 2016-02-25 ENCOUNTER — Ambulatory Visit: Payer: Disability Insurance

## 2016-03-01 ENCOUNTER — Ambulatory Visit
Admission: RE | Admit: 2016-03-01 | Discharge: 2016-03-01 | Disposition: A | Discharge status: Home / Self Care | Payer: Medicaid Other | Source: Ambulatory Visit | Attending: Family Medicine | Admitting: Family Medicine

## 2016-03-01 DIAGNOSIS — Z1231 Encounter for screening mammogram for malignant neoplasm of breast: Secondary | ICD-10-CM | POA: Diagnosis not present

## 2016-03-01 DIAGNOSIS — Z0000 Encounter for general adult medical examination without abnormal findings: Secondary | ICD-10-CM | POA: Diagnosis not present

## 2016-03-04 ENCOUNTER — Encounter: Payer: Self-pay | Admitting: Family

## 2016-03-04 ENCOUNTER — Ambulatory Visit: Payer: Medicaid Other | Attending: Family | Admitting: Family

## 2016-03-04 VITALS — BP 138/76 | HR 78 | Resp 18 | Ht 68.0 in | Wt 199.0 lb

## 2016-03-04 DIAGNOSIS — Z801 Family history of malignant neoplasm of trachea, bronchus and lung: Secondary | ICD-10-CM | POA: Insufficient documentation

## 2016-03-04 DIAGNOSIS — M1990 Unspecified osteoarthritis, unspecified site: Secondary | ICD-10-CM | POA: Diagnosis not present

## 2016-03-04 DIAGNOSIS — Z803 Family history of malignant neoplasm of breast: Secondary | ICD-10-CM | POA: Insufficient documentation

## 2016-03-04 DIAGNOSIS — Z9889 Other specified postprocedural states: Secondary | ICD-10-CM | POA: Diagnosis not present

## 2016-03-04 DIAGNOSIS — Z8719 Personal history of other diseases of the digestive system: Secondary | ICD-10-CM | POA: Insufficient documentation

## 2016-03-04 DIAGNOSIS — F329 Major depressive disorder, single episode, unspecified: Secondary | ICD-10-CM | POA: Insufficient documentation

## 2016-03-04 DIAGNOSIS — Z79899 Other long term (current) drug therapy: Secondary | ICD-10-CM | POA: Diagnosis not present

## 2016-03-04 DIAGNOSIS — Z880 Allergy status to penicillin: Secondary | ICD-10-CM | POA: Diagnosis not present

## 2016-03-04 DIAGNOSIS — Z87891 Personal history of nicotine dependence: Secondary | ICD-10-CM | POA: Diagnosis not present

## 2016-03-04 DIAGNOSIS — F32A Depression, unspecified: Secondary | ICD-10-CM

## 2016-03-04 DIAGNOSIS — J449 Chronic obstructive pulmonary disease, unspecified: Secondary | ICD-10-CM

## 2016-03-04 DIAGNOSIS — G4733 Obstructive sleep apnea (adult) (pediatric): Secondary | ICD-10-CM | POA: Diagnosis not present

## 2016-03-04 DIAGNOSIS — Z7982 Long term (current) use of aspirin: Secondary | ICD-10-CM | POA: Insufficient documentation

## 2016-03-04 DIAGNOSIS — I5032 Chronic diastolic (congestive) heart failure: Secondary | ICD-10-CM

## 2016-03-04 DIAGNOSIS — Z833 Family history of diabetes mellitus: Secondary | ICD-10-CM | POA: Insufficient documentation

## 2016-03-04 DIAGNOSIS — I110 Hypertensive heart disease with heart failure: Secondary | ICD-10-CM | POA: Diagnosis present

## 2016-03-04 NOTE — Patient Instructions (Signed)
Resume weighing daily and call for an overnight weight gain of > 2 pounds or a weekly weight gain of >5 pounds. 

## 2016-03-04 NOTE — Progress Notes (Addendum)
Subjective:    Patient ID: Erica [name], female    DOB: Jan 06, 1966, 50 y.o.   MRN: [id number]  Congestive Heart Failure  Presents for follow-up visit. Associated symptoms include edema, fatigue, palpitations and shortness of breath. Pertinent negatives include no abdominal pain or chest pain. The symptoms have been stable.  Shortness of Breath  This is a chronic problem. The current episode started more than 1 year ago. The problem occurs daily. The problem has been waxing and waning. Associated symptoms include leg swelling, neck pain and wheezing. Pertinent negatives include no abdominal pain, chest pain, orthopnea, PND or sore throat. The symptoms are aggravated by weather changes. She has tried beta agonist inhalers, steroid inhalers and body position changes for the symptoms. The treatment provided moderate relief. Her past medical history is significant for COPD and a heart failure.   Past Medical History:  Diagnosis Date  . Arthritis   . Asthma   . COPD (chronic obstructive pulmonary disease) (HCC)   . Gastric ulcer   . Hypertension   . OSA on CPAP no ins - no cpap machine for the last 4 years    Past Surgical History:  Procedure Laterality Date  . FLEXIBLE BRONCHOSCOPY N/A 06/20/2015   Procedure: FLEXIBLE BRONCHOSCOPY;  Surgeon: Shane CrutchPradeep Ramachandran, MD;  Location: [facility] ORS;  Service: Pulmonary;  Laterality: N/A;  . KNEE SURGERY Left    8 knee surgeries    Family History  Problem Relation Age of Onset  . Lung cancer Father   . Breast cancer Mother     early 2760's  . Diabetes Maternal Grandmother     Social History  Substance Use Topics  . Smoking status: Former Smoker    Packs/day: 1.50    Years: 36.00    Types: Cigarettes    Quit date: 06/13/2015  . Smokeless tobacco: Never Used  . Alcohol use No     Comment: occasionaly    Allergies  Allergen Reactions  . Tramadol Other (See Comments)    Heart palpatations  . Penicillins Rash    Prior to Admission  medications   Medication Sig Start Date End Date Taking? Authorizing Provider  albuterol (PROVENTIL HFA;VENTOLIN HFA) 108 (90 BASE) MCG/ACT inhaler Inhale 2 puffs into the lungs every 6 (six) hours as needed for wheezing or shortness of breath. 06/23/15  Yes Shaune PollackQing Chen, MD  aspirin EC 81 MG EC tablet Take 1 tablet (81 mg total) by mouth daily. 06/23/15  Yes Shaune PollackQing Chen, MD  atorvastatin (LIPITOR) 20 MG tablet Take 1 tablet (20 mg total) by mouth daily at 6 PM. 06/23/15  Yes Shaune PollackQing Chen, MD  beclomethasone (QVAR) 40 MCG/ACT inhaler Inhale 1 puff into the lungs 2 (two) times daily. 06/23/15  Yes Shaune PollackQing Chen, MD  DULoxetine (CYMBALTA) 30 MG capsule Take 30 mg by mouth daily.   Yes Historical Provider, MD  folic acid (FOLVITE) 1 MG tablet Take 1 mg by mouth daily.   Yes Historical Provider, MD  furosemide (LASIX) 40 MG tablet Take 1 tablet (40 mg total) by mouth daily. Hold if SBP<110 or DBP<60 07/16/15  Yes Delma Freezeina A Esta Carmon, FNP  HYDROcodone-acetaminophen (NORCO/VICODIN) 5-325 MG tablet Take 1 tablet by mouth every 6 (six) hours as needed for moderate pain.   Yes Historical Provider, MD  pantoprazole (PROTONIX) 40 MG tablet Take 1 tablet (40 mg total) by mouth daily. 06/23/15  Yes Shaune PollackQing Chen, MD  potassium chloride SA (K-DUR,KLOR-CON) 20 MEQ tablet Take 1 tablet (20 mEq total) by mouth  daily. 07/16/15  Yes Delma Freeze, FNP  sucralfate (CARAFATE) 1 G tablet Take 1 tablet (1 g total) by mouth 4 (four) times daily -  with meals and at bedtime. 06/23/15  Yes Shaune Pollack, MD  tiotropium (SPIRIVA HANDIHALER) 18 MCG inhalation capsule Place 18 mcg into inhaler and inhale daily.   Yes Historical Provider, MD  magic mouthwash SOLN Take 5 mLs by mouth 4 (four) times daily. Patient not taking: Reported on 03/04/2016 10/03/15   Tommi Rumps, PA-C      Review of Systems  Constitutional: Positive for appetite change and fatigue.  HENT: Positive for congestion. Negative for postnasal drip and sore throat.   Eyes: Negative.    Respiratory: Positive for chest tightness, shortness of breath and wheezing.   Cardiovascular: Positive for palpitations and leg swelling. Negative for chest pain, orthopnea and PND.  Gastrointestinal: Negative for abdominal distention and abdominal pain.  Endocrine: Negative.   Genitourinary: Negative.   Musculoskeletal: Positive for back pain and neck pain.  Skin: Negative.   Allergic/Immunologic: Negative.   Neurological: Positive for light-headedness. Negative for dizziness.  Hematological: Negative for adenopathy. Does not bruise/bleed easily.  Psychiatric/Behavioral: Positive for dysphoric mood and sleep disturbance (chronic trouble sleeping). Negative for suicidal ideas. The patient is nervous/anxious.        Objective:   Physical Exam  Constitutional: She is oriented to person, place, and time. She appears well-developed and well-nourished.  HENT:  Head: Normocephalic and atraumatic.  Eyes: Conjunctivae are normal. Pupils are equal, round, and reactive to light.  Neck: Normal range of motion. Neck supple.  Cardiovascular: Normal rate and regular rhythm.   Pulmonary/Chest: Effort normal. She has no wheezes. She has no rales.  Abdominal: Soft. She exhibits no distension. There is no tenderness.  Musculoskeletal: She exhibits edema (trace amount pedal edema in bilateral lower legs). She exhibits no tenderness.  Neurological: She is alert and oriented to person, place, and time.  Skin: Skin is warm and dry.  Psychiatric: She has a normal mood and affect. Her behavior is normal. Thought content normal.  Nursing note and vitals reviewed.   BP 138/76   Pulse 78   Resp 18   Ht 5\' 8"  (1.727 m)   Wt 199 lb (90.3 kg)   LMP 03/13/2015 (Approximate) Comment: states she is going through menopause  SpO2 99%   BMI 30.26 kg/m        Assessment & Plan:  1: Chronic heart failure with preserved ejection fraction- Patient presents with fatigue and shortness of breath with moderate  exertion (Class II). Symptoms do improve upon rest. Patient does have a little bit of swelling in her lower legs. She hasn't been weighing herself daily and she was encouraged to resume doing so. By our scale, she has gained 30.4 pounds since she was last here on 07/16/15. She says that she has been eating better and more. Reminded to call for an overnight weight gain of >2 pounds or a weekly weight gain of >5 pounds. She is not adding any salt to her food and is trying to eat low sodium foods. She hasn't seen her cardiologist in awhile because she didn't have insurance but now has some so an appointment was scheduled for her with her cardiologist.  2: COPD- Appears to be stable at this time. Does use her inhalers. 3: Obstructive sleep apnea- Is unable to wear her CPAP as it gives her panic attacks. 4: Depression- She has recently been started on cymbalta by  her PCP about 1 week ago. She will be following up closely with her PCP regarding this.   Patient did not bring her medications nor a list. Each medication was verbally reviewed with the patient and she was encouraged to bring the bottles to every visit to confirm accuracy of list.  Return in 3 months or sooner for any questions/problems before then.

## 2016-03-05 DIAGNOSIS — F32A Depression, unspecified: Secondary | ICD-10-CM | POA: Insufficient documentation

## 2016-03-05 DIAGNOSIS — F329 Major depressive disorder, single episode, unspecified: Secondary | ICD-10-CM | POA: Insufficient documentation

## 2016-03-08 DIAGNOSIS — B370 Candidal stomatitis: Secondary | ICD-10-CM | POA: Insufficient documentation

## 2016-04-01 ENCOUNTER — Ambulatory Visit
Admission: RE | Admit: 2016-04-01 | Discharge: 2016-04-01 | Disposition: A | Discharge status: Home / Self Care | Payer: Medicaid Other | Source: Ambulatory Visit | Attending: Family Medicine | Admitting: Family Medicine

## 2016-04-01 ENCOUNTER — Other Ambulatory Visit: Payer: Self-pay | Admitting: Family Medicine

## 2016-04-01 DIAGNOSIS — J441 Chronic obstructive pulmonary disease with (acute) exacerbation: Secondary | ICD-10-CM

## 2016-04-19 ENCOUNTER — Other Ambulatory Visit: Payer: Self-pay | Admitting: Specialist

## 2016-04-19 DIAGNOSIS — R918 Other nonspecific abnormal finding of lung field: Secondary | ICD-10-CM

## 2016-04-22 ENCOUNTER — Ambulatory Visit (INDEPENDENT_AMBULATORY_CARE_PROVIDER_SITE_OTHER): Payer: Medicaid Other | Admitting: Cardiovascular Disease

## 2016-04-22 ENCOUNTER — Encounter: Payer: Self-pay | Admitting: Cardiovascular Disease

## 2016-04-22 VITALS — BP 132/72 | HR 76 | Ht 68.0 in | Wt 192.0 lb

## 2016-04-22 DIAGNOSIS — R05 Cough: Secondary | ICD-10-CM | POA: Diagnosis not present

## 2016-04-22 DIAGNOSIS — R059 Cough, unspecified: Secondary | ICD-10-CM

## 2016-04-22 DIAGNOSIS — J440 Chronic obstructive pulmonary disease with acute lower respiratory infection: Secondary | ICD-10-CM

## 2016-04-22 DIAGNOSIS — I5032 Chronic diastolic (congestive) heart failure: Secondary | ICD-10-CM | POA: Diagnosis not present

## 2016-04-22 DIAGNOSIS — F17200 Nicotine dependence, unspecified, uncomplicated: Secondary | ICD-10-CM

## 2016-04-22 DIAGNOSIS — I272 Pulmonary hypertension, unspecified: Secondary | ICD-10-CM

## 2016-04-22 DIAGNOSIS — R000 Tachycardia, unspecified: Secondary | ICD-10-CM

## 2016-04-22 DIAGNOSIS — J209 Acute bronchitis, unspecified: Secondary | ICD-10-CM

## 2016-04-22 MED ORDER — DILTIAZEM HCL 30 MG PO TABS: 30.0000 mg | ORAL_TABLET | Freq: Three times a day (TID) | ORAL | 3 refills | Status: DC | PRN

## 2016-04-22 NOTE — Progress Notes (Signed)
Cardiology Office Note  Date:  04/22/2016   ID:  Erica [name], DOB 07-Oct-1965, MRN [id number]  PCP:  [facility]   Chief Complaint  Patient presents with  . other    C/o chest pain, irregular heart beat and sob. Meds reviewed verbally with pt.    HPI:  50 y.o. female with h/o smoking, COPD, on inhalers, medication noncompliance secondary to financial and insurance issues, history of thrush, chronic back pain who presented to the hospital in late November 2016 with shortness of breath, Diagnosed with pneumonia, echocardiogram showing normal LV function, elevated right heart pressures,  sodium around 112, abnormal troponin, long hospital course She presents today for follow-up of her chronic diastolic CHF  In follow-up today, she continue to smoke,  She has a prescription for Chantix and would like to try this She has been seen by Dr. Meredeth Ide, has repeat CT scan ordered Continues to take Advair, spiriva and proair  Set up for repeat  CT scan chest   Previous CT 06/2015 No significant CAD, PAD, minimal in aortic arch Images reviewed with her in detail    she is having rare palpitations, sometimes into come in clusters Skipping, pausing. Then long stretch without any symptoms  Worsening cough recently, feels she has bronchitis again  EKG on today's visit shows normal sinus rhythm with rate 76 bpm, no significant ST-T wave changes   PMH:   has a past medical history of Arthritis; Asthma; COPD (chronic obstructive pulmonary disease) (HCC); Gastric ulcer; Hypertension; and OSA on CPAP (no ins - no cpap machine for the last 4 years).  PSH:    Past Surgical History:  Procedure Laterality Date  . FLEXIBLE BRONCHOSCOPY N/A 06/20/2015   Procedure: FLEXIBLE BRONCHOSCOPY;  Surgeon: Shane Crutch, MD;  Location: [facility] ORS;  Service: Pulmonary;  Laterality: N/A;  . KNEE SURGERY Left    8 knee surgeries    Current Outpatient Prescriptions  Medication Sig Dispense  Refill  . albuterol (PROAIR HFA) 108 (90 Base) MCG/ACT inhaler Inhale into the lungs 4 (four) times daily.    Marland Kitchen amoxicillin-clavulanate (AUGMENTIN) 875-125 MG tablet Take 1 tablet by mouth 2 (two) times daily.    Marland Kitchen aspirin EC 81 MG EC tablet Take 1 tablet (81 mg total) by mouth daily. 30 tablet 0  . atorvastatin (LIPITOR) 20 MG tablet Take 1 tablet (20 mg total) by mouth daily at 6 PM. 30 tablet 0  . DULoxetine (CYMBALTA) 60 MG capsule Take 60 mg by mouth daily.    . Fluticasone-Salmeterol (ADVAIR DISKUS IN) Inhale into the lungs 2 (two) times daily.    . folic acid (FOLVITE) 1 MG tablet Take 1 mg by mouth daily.    . furosemide (LASIX) 40 MG tablet Take 1 tablet (40 mg total) by mouth daily. Hold if SBP<110 or DBP<60 30 tablet 0  . HYDROcodone-acetaminophen (NORCO/VICODIN) 5-325 MG tablet Take 1 tablet by mouth every 6 (six) hours as needed for moderate pain.    . magic mouthwash SOLN Take 5 mLs by mouth 4 (four) times daily. 100 mL 0  . pantoprazole (PROTONIX) 40 MG tablet Take 1 tablet (40 mg total) by mouth daily. 30 tablet 0  . potassium chloride SA (K-DUR,KLOR-CON) 20 MEQ tablet Take 1 tablet (20 mEq total) by mouth daily. (Patient taking differently: Take 20 mEq by mouth 2 (two) times daily. ) 30 tablet 0  . pregabalin (LYRICA) 50 MG capsule Take 50 mg by mouth 3 (three) times daily.    Marland Kitchen  sucralfate (CARAFATE) 1 G tablet Take 1 tablet (1 g total) by mouth 4 (four) times daily -  with meals and at bedtime. 120 tablet 0  . tiotropium (SPIRIVA HANDIHALER) 18 MCG inhalation capsule Place 18 mcg into inhaler and inhale daily.    Marland Kitchen diltiazem (CARDIZEM) 30 MG tablet Take 1 tablet (30 mg total) by mouth 3 (three) times daily as needed. 90 tablet 3   No current facility-administered medications for this visit.      Allergies:   Tramadol and Penicillins   Social History:  The patient  reports that she has been smoking Cigarettes.  She has a 54.00 pack-year smoking history. She has never used  smokeless tobacco. She reports that she drinks alcohol. She reports that she does not use drugs.   Family History:   family history includes Breast cancer in her mother; Diabetes in her maternal grandmother; Lung cancer in her father.    Review of Systems: Review of Systems  Constitutional: Negative.   Respiratory: Positive for cough and shortness of breath.   Cardiovascular: Negative.   Gastrointestinal: Negative.   Musculoskeletal: Negative.   Neurological: Negative.   Psychiatric/Behavioral: Negative.   All other systems reviewed and are negative.    PHYSICAL EXAM: VS:  BP 132/72 (BP Location: Left Arm, Patient Position: Sitting, Cuff Size: Normal)   Pulse 76   Ht 5\' 8"  (1.727 m)   Wt 192 lb (87.1 kg)   LMP 03/13/2015 (Approximate) Comment: states she is going through menopause  BMI 29.19 kg/m  , BMI Body mass index is 29.19 kg/m. GEN: Well nourished, well developed, in no acute distress  HEENT: normal  Neck: no JVD, carotid bruits, or masses Cardiac: RRR; no murmurs, rubs, or gallops,no edema  Respiratory:  Scattered Rales moderately decreased breath sounds throughout,  normal work of breathing GI: soft, nontender, nondistended, + BS MS: no deformity or atrophy  Skin: warm and dry, no rash Neuro:  Strength and sensation are intact Psych: euthymic mood, full affect    Recent Labs: 06/14/2015: ALT 15 06/18/2015: B Natriuretic Peptide 802.0 06/19/2015: TSH 8.074 06/21/2015: Hemoglobin 9.7; Platelets 238 06/22/2015: Magnesium 1.8 06/23/2015: BUN 11; Creatinine, Ser 0.73; Potassium 3.1; Sodium 130    Lipid Panel Lab Results  Component Value Date   CHOL 177 06/14/2015   HDL 25 (L) 06/14/2015   LDLCALC 113 (H) 06/14/2015   TRIG 193 (H) 06/14/2015      Wt Readings from Last 3 Encounters:  04/22/16 192 lb (87.1 kg)  03/04/16 199 lb (90.3 kg)  10/03/15 178 lb (80.7 kg)       ASSESSMENT AND PLAN:   Chronic diastolic heart failure (HCC) - Plan: EKG 12-Lead,  Basic Metabolic Panel (BMET) Encouraged her to continue on Lasix 40 mg daily Smoking cessation recommended Previous hospital admission probably secondary to pneumonia, elevated right heart pressures secondary to severe underlying lung disease  Pulmonary HTN Moderately elevated right heart pressures on previous echocardiogram in the setting of pneumonia.   Cough She has recurrent cough consistent with bronchitis. She has follow-up with Dr. Meredeth Ide  COPD (chronic obstructive pulmonary disease) with acute bronchitis (HCC) Smoking cessation recommended, long history of smoking Underlying COPD, on several inhalers High risk of recurrent pneumonia  Smoker We have encouraged her to continue to work on weaning her cigarettes and smoking cessation. She will continue to work on this and does not want any assistance with chantix.    Total encounter time more than 25 minutes  Greater than 50%  was spent in counseling and coordination of care with the patient   Disposition:   F/U  6 months   Orders Placed This Encounter  Procedures  . Basic Metabolic Panel (BMET)  . EKG 12-Lead   Signed, Dossie Arbourim Yuna Pizzolato, M.D., Ph.D. 04/22/2016  [facility], [place] [phone]

## 2016-04-22 NOTE — Patient Instructions (Addendum)
Medication Instructions:   Hold the lipitor/atorvastatin for a few weeks  Please take diltiazem as needed for palpitations or tachycardia  Labwork:  We will check a BMP today  Testing/Procedures:   No further testing at this time  Please call if you have worsening palpitations, We could order a monitor   Follow-Up: It was a pleasure seeing you in the office today. Please call us if you have new issues that need to be addressed before your next appt.  [phone]  Your physician wants you to follow-up in: 12 months.  You will receive a reminder letter in the mail two months in advance. If you don't receive a letter, please call our office to schedule the follow-up appointment.  If you need a refill on your cardiac medications before your next appointment, please call your pharmacy.         Steps to Quit Smoking  Smoking tobacco can be harmful to your health and can affect almost every organ in your body. Smoking puts you, and those around you, at risk for developing many serious chronic diseases. Quitting smoking is difficult, but it is one of the best things that you can do for your health. It is never too late to quit. WHAT ARE THE BENEFITS OF QUITTING SMOKING? When you quit smoking, you lower your risk of developing serious diseases and conditions, such as:  Lung cancer or lung disease, such as COPD.  Heart disease.  Stroke.  Heart attack.  Infertility.  Osteoporosis and bone fractures. Additionally, symptoms such as coughing, wheezing, and shortness of breath may get better when you quit. You may also find that you get sick less often because your body is stronger at fighting off colds and infections. If you are pregnant, quitting smoking can help to reduce your chances of having a baby of low birth weight. HOW DO I GET READY TO QUIT? When you decide to quit smoking, create a plan to make sure that you are successful. Before you quit:  Pick a date to quit.  Set a date within the next two weeks to give you time to prepare.  Write down the reasons why you are quitting. Keep this list in places where you will see it often, such as on your bathroom mirror or in your car or wallet.  Identify the people, places, things, and activities that make you want to smoke (triggers) and avoid them. Make sure to take these actions:  Throw away all cigarettes at home, at work, and in your car.  Throw away smoking accessories, such as Set designerashtrays and lighters.  Clean your car and make sure to empty the ashtray.  Clean your home, including curtains and carpets.  Tell your family, friends, and coworkers that you are quitting. Support from your loved ones can make quitting easier.  Talk with your health care provider about your options for quitting smoking.  Find out what treatment options are covered by your health insurance. WHAT STRATEGIES CAN I USE TO QUIT SMOKING?  Talk with your healthcare provider about different strategies to quit smoking. Some strategies include:  Quitting smoking altogether instead of gradually lessening how much you smoke over a period of time. Research shows that quitting "cold [place]" is more successful than gradually quitting.  Attending in-person counseling to help you build problem-solving skills. You are more likely to have success in quitting if you attend several counseling sessions. Even short sessions of 10 minutes can be effective.  Finding resources and support systems that  can help you to quit smoking and remain smoke-free after you quit. These resources are most helpful when you use them often. They can include:  Online chats with a Veterinary surgeon.  Telephone quitlines.  Printed Materials engineer.  Support groups or group counseling.  Text messaging programs.  Mobile phone applications.  Taking medicines to help you quit smoking. (If you are pregnant or breastfeeding, talk with your health care provider first.) Some  medicines contain nicotine and some do not. Both types of medicines help with cravings, but the medicines that include nicotine help to relieve withdrawal symptoms. Your health care provider may recommend:  Nicotine patches, gum, or lozenges.  Nicotine inhalers or sprays.  Non-nicotine medicine that is taken by mouth. Talk with your health care provider about combining strategies, such as taking medicines while you are also receiving in-person counseling. Using these two strategies together makes you more likely to succeed in quitting than if you used either strategy on its own. If you are pregnant or breastfeeding, talk with your health care provider about finding counseling or other support strategies to quit smoking. Do not take medicine to help you quit smoking unless told to do so by your health care provider. WHAT THINGS CAN I DO TO MAKE IT EASIER TO QUIT? Quitting smoking might feel overwhelming at first, but there is a lot that you can do to make it easier. Take these important actions:  Reach out to your family and friends and ask that they support and encourage you during this time. Call telephone quitlines, reach out to support groups, or work with a counselor for support.  Ask people who smoke to avoid smoking around you.  Avoid places that trigger you to smoke, such as bars, parties, or smoke-break areas at work.  Spend time around people who do not smoke.  Lessen stress in your life, because stress can be a smoking trigger for some people. To lessen stress, try:  Exercising regularly.  Deep-breathing exercises.  Yoga.  Meditating.  Performing a body scan. This involves closing your eyes, scanning your body from head to toe, and noticing which parts of your body are particularly tense. Purposefully relax the muscles in those areas.  Download or purchase mobile phone or tablet apps (applications) that can help you stick to your quit plan by providing reminders, tips, and  encouragement. There are many free apps, such as QuitGuide from the [employer] developer for Disease Control and Prevention). You can find other support for quitting smoking (smoking cessation) through smokefree.gov and other websites. HOW WILL I FEEL WHEN I QUIT SMOKING? Within the first 24 hours of quitting smoking, you may start to feel some withdrawal symptoms. These symptoms are usually most noticeable 2-3 days after quitting, but they usually do not last beyond 2-3 weeks. Changes or symptoms that you might experience include:  Mood swings.  Restlessness, anxiety, or irritation.  Difficulty concentrating.  Dizziness.  Strong cravings for sugary foods in addition to nicotine.  Mild weight gain.  Constipation.  Nausea.  Coughing or a sore throat.  Changes in how your medicines work in your body.  A depressed mood.  Difficulty sleeping (insomnia). After the first 2-3 weeks of quitting, you may start to notice more positive results, such as:  Improved sense of smell and taste.  Decreased coughing and sore throat.  Slower heart rate.  Lower blood pressure.  Clearer skin.  The ability to breathe more easily.  Fewer sick days. Quitting smoking is very challenging for most  people. Do not get discouraged if you are not successful the first time. Some people need to make many attempts to quit before they achieve long-term success. Do your best to stick to your quit plan, and talk with your health care provider if you have any questions or concerns.   This information is not intended to replace advice given to you by your health care provider. Make sure you discuss any questions you have with your health care provider.   Document Released: 06/29/2001 Document Revised: 11/19/2014 Document Reviewed: 11/19/2014 Elsevier Interactive Patient Education 2016 [employer]. Smoking Hazards Smoking cigarettes is extremely bad for your health. Tobacco smoke has over 200 known poisons in it.  It contains the poisonous gases nitrogen oxide and carbon monoxide. There are over 60 chemicals in tobacco smoke that cause cancer. Some of the chemicals found in cigarette smoke include:   Cyanide.   Benzene.   Formaldehyde.   Methanol (wood alcohol).   Acetylene (fuel used in welding torches).   Ammonia.  Even smoking lightly shortens your life expectancy by several years. You can greatly reduce the risk of medical problems for you and your family by stopping now. Smoking is the most preventable cause of death and disease in our society. Within days of quitting smoking, your circulation improves, you decrease the risk of having a heart attack, and your lung capacity improves. There may be some increased phlegm in the first few days after quitting, and it may take months for your lungs to clear up completely. Quitting for 10 years reduces your risk of developing lung cancer to almost that of a nonsmoker.  WHAT ARE THE RISKS OF SMOKING? Cigarette smokers have an increased risk of many serious medical problems, including:  Lung cancer.   Lung disease (such as pneumonia, bronchitis, and emphysema).   Heart attack and chest pain due to the heart not getting enough oxygen (angina).   Heart disease and peripheral blood vessel disease.   Hypertension.   Stroke.   Oral cancer (cancer of the lip, mouth, or voice box).   Bladder cancer.   Pancreatic cancer.   Cervical cancer.   Pregnancy complications, including premature birth.   Stillbirths and smaller newborn babies, birth defects, and genetic damage to sperm.   Early menopause.   Lower estrogen level for women.   Infertility.   Facial wrinkles.   Blindness.   Increased risk of broken bones (fractures).   Senile dementia.   Stomach ulcers and internal bleeding.   Delayed wound healing and increased risk of complications during surgery. Because of secondhand smoke exposure, children of  smokers have an increased risk of the following:   Sudden infant death syndrome (SIDS).   Respiratory infections.   Lung cancer.   Heart disease.   Ear infections.  WHY IS SMOKING ADDICTIVE? Nicotine is the chemical agent in tobacco that is capable of causing addiction or dependence. When you smoke and inhale, nicotine is absorbed rapidly into the bloodstream through your lungs. Both inhaled and noninhaled nicotine may be addictive.  WHAT ARE THE BENEFITS OF QUITTING?  There are many health benefits to quitting smoking. Some are:   The likelihood of developing cancer and heart disease decreases. Health improvements are seen almost immediately.   Blood pressure, pulse rate, and breathing patterns start returning to normal soon after quitting.   People who quit may see an improvement in their overall quality of life.  HOW DO YOU QUIT SMOKING? Smoking is an addiction with both physical  and psychological effects, and longtime habits can be hard to change. Your health care provider can recommend:  Programs and community resources, which may include group support, education, or therapy.  Replacement products, such as patches, gum, and nasal sprays. Use these products only as directed. Do not replace cigarette smoking with electronic cigarettes (commonly called e-cigarettes). The safety of e-cigarettes is unknown, and some may contain harmful chemicals. FOR MORE INFORMATION  American Lung Association: www.lung.org  American Cancer Society: www.cancer.org   This information is not intended to replace advice given to you by your health care provider. Make sure you discuss any questions you have with your health care provider.   Document Released: 08/12/2004 Document Revised: 04/25/2013 Document Reviewed: 12/25/2012 Elsevier Interactive Patient Education 2016 [employer]. Secondhand Smoke WHAT IS SECONDHAND SMOKE? Secondhand smoke is smoke that comes from burning tobacco. It  could be the smoke from a cigarette, a pipe, or a cigar. Even if you are not the one smoking, secondhand smoke exposes you to the dangers of smoking. This is called involuntary, or passive, smoking. There are two types of secondhand smoke:  Sidestream smoke is the smoke that comes off the lighted end of a cigarette, pipe, or cigar.  This type of smoke has the highest amount of cancer-causing agents (carcinogens).  The particles in sidestream smoke are smaller. They get into your lungs more easily.  Mainstream smoke is the smoke that is exhaled by a person who is smoking.  This type of smoke is also dangerous to your health. HOW CAN SECONDHAND SMOKE AFFECT MY HEALTH? Studies show that there is no safe level of secondhand smoke. This smoke contains thousands of chemicals. At least 69 of them are known to cause cancer. Secondhand smoke can also cause many other health problems. It has been linked to:  Lung cancer.  Cancer of the voice box (larynx) or throat.  Cancer of the sinuses.  Brain cancer.  Bladder cancer.  Stomach cancer.  Breast cancer.  White blood cell cancers (lymphoma and leukemia).  Brain and liver tumors in children.  Heart disease and stroke in adults.  Pregnancy loss (miscarriage).  Diseases in children, such as:  Asthma.  Lung infections.  Ear infections.  Sudden infant death syndrome (SIDS).  Slow growth. WHERE CAN I BE AT RISK FOR EXPOSURE TO SECONDHAND SMOKE?   For adults, the workplace is the main source of exposure to secondhand smoke.  Your workplace should have a policy separating smoking areas from nonsmoking areas.  Smoking areas should have a system for ventilating and cleaning the air.  For children, the home may be the most dangerous place for exposure to secondhand smoke.  Children who live in apartment buildings may be at risk from smoke drifting from hallways or other people's homes.  For everyone, many public places are  possible sources of exposure to secondhand smoke.  These places include restaurants, shopping centers, and parks. HOW CAN I REDUCE MY RISK FOR EXPOSURE TO SECONDHAND SMOKE? The most important thing you can do is not smoke. Discourage family members from smoking. Other ways to reduce exposure for you and your family include the following:  Keep your home smoke free.  Make sure your child care providers do not smoke.  Warn your child about the dangers of smoking and secondhand smoke.  Do not allow smoking in your car. When someone smokes in a car, all the damaging chemicals from the smoke are confined in a small area.  Avoid public places where  smoking is allowed.   This information is not intended to replace advice given to you by your health care provider. Make sure you discuss any questions you have with your health care provider.   Document Released: 08/12/2004 Document Revised: 07/26/2014 Document Reviewed: 10/19/2013 Elsevier Interactive Patient Education [employer]

## 2016-04-23 LAB — BASIC METABOLIC PANEL
BUN / CREAT RATIO: 15 (ref 9–23)
BUN: 10 mg/dL (ref 6–24)
CHLORIDE: 99 mmol/L (ref 96–106)
CO2: 24 mmol/L (ref 18–29)
CREATININE: 0.65 mg/dL (ref 0.57–1.00)
Calcium: 9.4 mg/dL (ref 8.7–10.2)
GFR calc Af Amer: 119 mL/min/{1.73_m2} (ref >59)
GFR calc non Af Amer: 103 mL/min/{1.73_m2} (ref >59)
GLUCOSE: 105 mg/dL — AB (ref 65–99)
POTASSIUM: 4.2 mmol/L (ref 3.5–5.2)
SODIUM: 140 mmol/L (ref 134–144)

## 2016-04-27 ENCOUNTER — Ambulatory Visit
Admission: RE | Admit: 2016-04-27 | Discharge: 2016-04-27 | Disposition: A | Discharge status: Home / Self Care | Payer: Medicaid Other | Source: Ambulatory Visit | Attending: Specialist | Admitting: Specialist

## 2016-04-27 DIAGNOSIS — R918 Other nonspecific abnormal finding of lung field: Secondary | ICD-10-CM

## 2016-04-27 DIAGNOSIS — J439 Emphysema, unspecified: Secondary | ICD-10-CM | POA: Diagnosis not present

## 2016-04-29 ENCOUNTER — Ambulatory Visit: Payer: Medicaid Other | Attending: Pain Medicine | Admitting: Pain Medicine

## 2016-04-29 ENCOUNTER — Encounter: Payer: Self-pay | Admitting: Pain Medicine

## 2016-04-29 VITALS — BP 139/67 | HR 66 | Temp 97.9°F | Resp 18 | Ht 68.0 in | Wt 190.0 lb

## 2016-04-29 DIAGNOSIS — M79641 Pain in right hand: Secondary | ICD-10-CM | POA: Insufficient documentation

## 2016-04-29 DIAGNOSIS — F17210 Nicotine dependence, cigarettes, uncomplicated: Secondary | ICD-10-CM | POA: Insufficient documentation

## 2016-04-29 DIAGNOSIS — Z79891 Long term (current) use of opiate analgesic: Secondary | ICD-10-CM | POA: Diagnosis not present

## 2016-04-29 DIAGNOSIS — M545 Low back pain, unspecified: Secondary | ICD-10-CM | POA: Insufficient documentation

## 2016-04-29 DIAGNOSIS — G4700 Insomnia, unspecified: Secondary | ICD-10-CM | POA: Insufficient documentation

## 2016-04-29 DIAGNOSIS — J209 Acute bronchitis, unspecified: Secondary | ICD-10-CM | POA: Diagnosis not present

## 2016-04-29 DIAGNOSIS — M546 Pain in thoracic spine: Secondary | ICD-10-CM | POA: Insufficient documentation

## 2016-04-29 DIAGNOSIS — M79674 Pain in right toe(s): Secondary | ICD-10-CM

## 2016-04-29 DIAGNOSIS — Z801 Family history of malignant neoplasm of trachea, bronchus and lung: Secondary | ICD-10-CM | POA: Insufficient documentation

## 2016-04-29 DIAGNOSIS — F329 Major depressive disorder, single episode, unspecified: Secondary | ICD-10-CM | POA: Diagnosis not present

## 2016-04-29 DIAGNOSIS — E871 Hypo-osmolality and hyponatremia: Secondary | ICD-10-CM | POA: Insufficient documentation

## 2016-04-29 DIAGNOSIS — N289 Disorder of kidney and ureter, unspecified: Secondary | ICD-10-CM | POA: Insufficient documentation

## 2016-04-29 DIAGNOSIS — M25559 Pain in unspecified hip: Secondary | ICD-10-CM

## 2016-04-29 DIAGNOSIS — I110 Hypertensive heart disease with heart failure: Secondary | ICD-10-CM | POA: Diagnosis not present

## 2016-04-29 DIAGNOSIS — G4733 Obstructive sleep apnea (adult) (pediatric): Secondary | ICD-10-CM | POA: Diagnosis not present

## 2016-04-29 DIAGNOSIS — M419 Scoliosis, unspecified: Secondary | ICD-10-CM | POA: Insufficient documentation

## 2016-04-29 DIAGNOSIS — M533 Sacrococcygeal disorders, not elsewhere classified: Secondary | ICD-10-CM | POA: Diagnosis not present

## 2016-04-29 DIAGNOSIS — E785 Hyperlipidemia, unspecified: Secondary | ICD-10-CM | POA: Insufficient documentation

## 2016-04-29 DIAGNOSIS — G8929 Other chronic pain: Secondary | ICD-10-CM | POA: Diagnosis present

## 2016-04-29 DIAGNOSIS — M542 Cervicalgia: Secondary | ICD-10-CM | POA: Diagnosis not present

## 2016-04-29 DIAGNOSIS — M79675 Pain in left toe(s): Secondary | ICD-10-CM

## 2016-04-29 DIAGNOSIS — G629 Polyneuropathy, unspecified: Secondary | ICD-10-CM | POA: Diagnosis not present

## 2016-04-29 DIAGNOSIS — M25561 Pain in right knee: Secondary | ICD-10-CM | POA: Insufficient documentation

## 2016-04-29 DIAGNOSIS — M549 Dorsalgia, unspecified: Secondary | ICD-10-CM

## 2016-04-29 DIAGNOSIS — I5032 Chronic diastolic (congestive) heart failure: Secondary | ICD-10-CM | POA: Diagnosis not present

## 2016-04-29 DIAGNOSIS — M79642 Pain in left hand: Secondary | ICD-10-CM | POA: Diagnosis not present

## 2016-04-29 DIAGNOSIS — M47816 Spondylosis without myelopathy or radiculopathy, lumbar region: Secondary | ICD-10-CM

## 2016-04-29 DIAGNOSIS — Z803 Family history of malignant neoplasm of breast: Secondary | ICD-10-CM | POA: Insufficient documentation

## 2016-04-29 DIAGNOSIS — F1190 Opioid use, unspecified, uncomplicated: Secondary | ICD-10-CM

## 2016-04-29 DIAGNOSIS — I272 Pulmonary hypertension, unspecified: Secondary | ICD-10-CM | POA: Insufficient documentation

## 2016-04-29 DIAGNOSIS — Z8679 Personal history of other diseases of the circulatory system: Secondary | ICD-10-CM

## 2016-04-29 DIAGNOSIS — M25562 Pain in left knee: Secondary | ICD-10-CM | POA: Diagnosis not present

## 2016-04-29 DIAGNOSIS — Z7982 Long term (current) use of aspirin: Secondary | ICD-10-CM | POA: Insufficient documentation

## 2016-04-29 DIAGNOSIS — J440 Chronic obstructive pulmonary disease with acute lower respiratory infection: Secondary | ICD-10-CM | POA: Diagnosis not present

## 2016-04-29 DIAGNOSIS — Z880 Allergy status to penicillin: Secondary | ICD-10-CM | POA: Insufficient documentation

## 2016-04-29 DIAGNOSIS — Z885 Allergy status to narcotic agent status: Secondary | ICD-10-CM | POA: Insufficient documentation

## 2016-04-29 DIAGNOSIS — Z8719 Personal history of other diseases of the digestive system: Secondary | ICD-10-CM | POA: Insufficient documentation

## 2016-04-29 DIAGNOSIS — M1288 Other specific arthropathies, not elsewhere classified, other specified site: Secondary | ICD-10-CM

## 2016-04-29 DIAGNOSIS — Z833 Family history of diabetes mellitus: Secondary | ICD-10-CM | POA: Insufficient documentation

## 2016-04-29 DIAGNOSIS — G609 Hereditary and idiopathic neuropathy, unspecified: Secondary | ICD-10-CM

## 2016-04-29 DIAGNOSIS — M79643 Pain in unspecified hand: Secondary | ICD-10-CM

## 2016-04-29 NOTE — Progress Notes (Signed)
Safety precautions to be maintained throughout the outpatient stay will include: orient to surroundings, keep bed in low position, maintain call bell within reach at all times, provide assistance with transfer out of bed and ambulation.  

## 2016-04-29 NOTE — Progress Notes (Signed)
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  Laterality: N/A;  . KNEE SURGERY Left    8 knee surgeries   Active Ambulatory Problems    Diagnosis Date Noted  . Hyponatremia 06/14/2015  . Shortness of breath   . Swelling   . Cough   . COPD (chronic obstructive pulmonary disease) with acute bronchitis (HCC)   . Renal insufficiency   . Pulmonary HTN   . COPD, mild (HCC)   . Chronic diastolic heart failure (HCC) 07/16/2015  . Smoker 07/16/2015  . Obstructive sleep apnea 07/16/2015  . Tachycardia 07/16/2015  . Depression 03/05/2016  . Chronic pain 04/29/2016  . Long term current use of opiate analgesic 04/29/2016  . Long term prescription opiate use 04/29/2016  . Opiate use 04/29/2016  . Chronic low back pain (Location of Primary Source of Pain) (Bilateral) (L>R) 04/29/2016  . Chronic hip pain (Location of Secondary source of pain) (Bilateral) (L>R) 04/29/2016  . Chronic knee pain (Location of Tertiary source of pain) (Bilateral) (L>R) 04/29/2016  . Chronic neck pain (Bilateral) (R>L)  04/29/2016  . Chronic upper back pain  (midline) 04/29/2016  . Chronic foot pain (bottom of feet) (Bilateral) (R>L) 04/29/2016  . Chronic hand pain (Bilateral) 04/29/2016  . Peripheral neuropathy, idiopathic (upper and lower extremity) 04/29/2016  . History of congestive heart failure 04/29/2016  . Chronic sacroiliac joint pain (Bilateral) (R>L) 04/29/2016  . Lumbar facet syndrome (Bilateral) (R>L) 04/29/2016   Resolved Ambulatory Problems    Diagnosis Date Noted  . Chest pain at rest 06/14/2015  . Acute on chronic diastolic CHF (congestive heart failure) (HCC)   . SOB (shortness of breath)   . Dyspnea   . Pneumonia    Past Medical History:  Diagnosis Date  . Arthritis   . Asthma   . CHF (congestive heart failure) (HCC)   . COPD (chronic obstructive pulmonary disease) (HCC)   . Gastric ulcer   . Hyperlipidemia   . Hypertension   . OSA on CPAP no ins - no cpap machine for the last 4 years   Constitutional Exam  General appearance: Well nourished, well developed, and well hydrated. In no apparent acute distress Vitals:   04/29/16 1427  BP: 139/67  Pulse: 66  Resp: 18  Temp: 97.9 F (36.6 C)  SpO2: 100%  Weight: 190 lb (86.2 kg)  Height: 5\' 8"  (1.727 m)  BMI Assessment: Estimated body mass index is 28.89 kg/m as calculated from the following:   Height as of this encounter: 5\' 8"  (1.727 m).   Weight as of this encounter: 190 lb (86.2 kg).   BMI interpretation: (25-29.9 kg/m2) = Overweight: This range is associated with a 20% higher incidence of chronic pain. BMI Readings from Last 4 Encounters:  04/29/16 28.89 kg/m  04/22/16 29.19 kg/m  03/04/16 30.26 kg/m  10/03/15 27.06 kg/m   Wt Readings from Last 4 Encounters:  04/29/16 190 lb (86.2 kg)  04/22/16 192 lb (87.1 kg)  03/04/16 199 lb (90.3 kg)  10/03/15 178 lb (80.7 kg)  Psych/Mental status: Alert, oriented x 3 (person, place, & time) Eyes: PERLA Respiratory: No evidence of acute respiratory distress  Cervical Spine Exam  Inspection: No masses,  redness, or swelling Alignment: Symmetrical Functional ROM: Unrestricted ROM Stability: No instability detected Muscle strength & Tone: Functionally intact Sensory: Unimpaired Palpation: Non-contributory  Upper Extremity (UE) Exam    Side: Right upper extremity  Side: Left upper extremity  Inspection: No masses, redness, swelling, or asymmetry  Inspection: No masses, redness, swelling, or asymmetry  Functional ROM: Unrestricted ROM         Functional ROM: Unrestricted ROM          Muscle strength & Tone: Functionally intact  Muscle strength & Tone: Functionally intact  Sensory: Unimpaired  Sensory: Unimpaired  Palpation: Non-contributory  Palpation: Non-contributory   Thoracic Spine Exam  Inspection: No masses, redness, or swelling Alignment: Symmetrical Functional ROM: Unrestricted ROM Stability: No instability detected Sensory: Unimpaired Muscle strength & Tone: Functionally intact Palpation: Non-contributory  Lumbar Spine Exam  Inspection: No masses, redness, or swelling Alignment: Symmetrical Functional ROM: Decreased ROM Stability: No instability detected Muscle strength & Tone: Functionally intact Sensory: Movement-associated pain Palpation: Complains of area being tender to palpation Provocative Tests: Lumbar Hyperextension and rotation test: Positive bilaterally for facet joint pain. Patrick's Maneuver: Positive for bilateral S-I joint pain and for bilateral hip joint pain.  Gait & Posture Assessment  Ambulation: Unassisted Gait: Relatively normal for age and body habitus Posture: WNL   Lower Extremity Exam    Side: Right lower extremity  Side:  Left lower extremity  Inspection: No masses, redness, swelling, or asymmetry  Inspection: No masses, redness, swelling, or asymmetry  Functional ROM: Restricted ROM for hip and knee joints  Functional ROM: Decreased ROM for hip and knee joints  Muscle strength & Tone: Functionally intact  Muscle strength & Tone:  Functionally intact  Sensory: Unimpaired  Sensory: Unimpaired  Palpation: Non-contributory  Palpation: Non-contributory   Assessment  Primary Diagnosis & Pertinent Problem List: The primary encounter diagnosis was Chronic pain. Diagnoses of Long term current use of opiate analgesic, Long term prescription opiate use, Opiate use, Chronic low back pain (Location of Primary Source of Pain) (Bilateral) (L>R), Chronic hip pain, unspecified laterality, Chronic knee pain (Location of Tertiary source of pain) (Bilateral) (L>R), Chronic neck pain (Bilateral) (R>L), Chronic upper back pain (midline), Chronic foot pain (bottom of feet) (Bilateral) (R>L), Chronic hand pain, unspecified laterality, Peripheral neuropathy, idiopathic (upper and lower extremity), History of congestive heart failure, Chronic sacroiliac joint pain (Bilateral) (R>L), and Lumbar facet syndrome (Bilateral) (R>L) were also pertinent to this visit.  Visit Diagnosis: 1. Chronic pain   2. Long term current use of opiate analgesic   3. Long term prescription opiate use   4. Opiate use   5. Chronic low back pain (Location of Primary Source of Pain) (Bilateral) (L>R)   6. Chronic hip pain, unspecified laterality   7. Chronic knee pain (Location of Tertiary source of pain) (Bilateral) (L>R)   8. Chronic neck pain (Bilateral) (R>L)   9. Chronic upper back pain (midline)   10. Chronic foot pain (bottom of feet) (Bilateral) (R>L)   11. Chronic hand pain, unspecified laterality   12. Peripheral neuropathy, idiopathic (upper and lower extremity)   13. History of congestive heart failure   14. Chronic sacroiliac joint pain (Bilateral) (R>L)   15. Lumbar facet syndrome (Bilateral) (R>L)    Plan of Care  Initial Treatment Plan:  Please be advised that as per protocol, today's visit has been an evaluation only. We have not taken over the patient's controlled substance management.  Problem-Specific Plan: No problem-specific Assessment & Plan  notes found for this encounter.  Ordered Lab-work, Procedure(s), Referral(s), & Consult(s): Orders Placed This Encounter  Procedures  . DG Cervical Spine Complete  . DG Thoracic Spine 2 View  . DG Lumbar Spine Complete W/Bend  . DG Si Joints  . DG HIP UNILAT W OR W/O PELVIS 2-3 VIEWS LEFT  . DG HIP UNILAT W OR W/O PELVIS 2-3 VIEWS RIGHT  . Compliance Drug Analysis, Ur  . C-reactive protein  . Magnesium  . Sedimentation rate  . Vitamin B12  . 25-Hydroxyvitamin D Lcms D2+D3  . Brain natriuretic peptide  . CBC  . Ambulatory referral to Psychology  . NCV with EMG(electromyography)  . NCV with EMG(electromyography)   Pharmacotherapy: Medications ordered:  No orders of the defined types were placed in this encounter.  Medications administered during this visit: Erica [name] had no medications administered during this visit.   Pharmacotherapy under consideration:  Opioid Analgesics: The patient was informed that there is no guarantee that she would be a candidate for opioid analgesics. The decision will be made following CDC guidelines. This decision will be based on the results of diagnostic studies, as well as Erica [name]'s risk profile.    Interventional therapies under consideration: The patient was informed that there is no guarantee that she would be a candidate for interventional therapies. The decision will be based on the results of diagnostic studies, as well  as Erica [name] risk profile.  Diagnostic bilateral lumbar facet blocks under fluoroscopic guidance and IV sedation.  Possible bilateral diagnostic sacroiliac joint block under fluoroscopic guidance and IV sedation.  Possible bilateral intra-articular hip joint injection under fluoroscopic guidance and IV sedation.  Possible bilateral intra-articular knee joint injection, without fluoroscopy or IV sedation.  Possible bilateral genicular nerve block under fluoroscopic guidance and IV sedation.  Possible bilateral knee  joint radiofrequency ablation.  Possible left-sided lumbar epidural steroid injection under fluoroscopic guidance, with or without sedation.  Possible right-sided cervical epidural steroid injection under fluoroscopic guidance and IV sedation.  Possible bilateral diagnostic cervical facet block under fluoroscopic guidance and IV sedation.  Possible bilateral diagnostic greater occipital nerve blocks under fluoroscopic guidance, with or without sedation. Possible greater occipital nerve radiofrequency ablation.    Requested PM Follow-up: Return for 2nd Visit Eval, After [facility] Eval.  Future Appointments Date Time Provider Department Center  05/31/2016 1:00 PM Delma Freeze, FNP [facility] None    Primary Care Physician: [facility] Location: [facility] Facility Note by: Sydnee Levans. Laban Emperor, M.D, DABA, DABAPM, DABPM, DABIPP, FIPP  Pain Score Disclaimer: We use the NRS-11 scale. This is a self-reported, subjective measurement of pain severity with only modest accuracy. It is used primarily to identify changes within a particular patient. It must be understood that outpatient pain scales are significantly less accurate that those used for research, where they can be applied under ideal controlled circumstances with minimal exposure to variables. In reality, the score is likely to be a combination of pain intensity and pain affect, where pain affect describes the degree of emotional arousal or changes in action readiness caused by the sensory experience of pain. Factors such as social and work situation, setting, emotional state, anxiety levels, expectation, and prior pain experience may influence pain perception and show large inter-individual differences that may also be affected by time variables.  Patient instructions provided during this appointment: There are no Patient Instructions on file for this visit.

## 2016-04-30 ENCOUNTER — Ambulatory Visit
Admission: RE | Admit: 2016-04-30 | Discharge: 2016-04-30 | Disposition: A | Discharge status: Home / Self Care | Payer: Medicaid Other | Source: Ambulatory Visit | Attending: Pain Medicine | Admitting: Pain Medicine

## 2016-04-30 ENCOUNTER — Other Ambulatory Visit
Admission: RE | Admit: 2016-04-30 | Discharge: 2016-04-30 | Disposition: A | Discharge status: Home / Self Care | Payer: Medicaid Other | Source: Ambulatory Visit | Attending: Pain Medicine | Admitting: Pain Medicine

## 2016-04-30 DIAGNOSIS — M1611 Unilateral primary osteoarthritis, right hip: Secondary | ICD-10-CM | POA: Insufficient documentation

## 2016-04-30 DIAGNOSIS — Z8679 Personal history of other diseases of the circulatory system: Secondary | ICD-10-CM

## 2016-04-30 DIAGNOSIS — I700 Atherosclerosis of aorta: Secondary | ICD-10-CM | POA: Diagnosis not present

## 2016-04-30 DIAGNOSIS — M542 Cervicalgia: Principal | ICD-10-CM

## 2016-04-30 DIAGNOSIS — M549 Dorsalgia, unspecified: Secondary | ICD-10-CM | POA: Diagnosis not present

## 2016-04-30 DIAGNOSIS — G609 Hereditary and idiopathic neuropathy, unspecified: Secondary | ICD-10-CM

## 2016-04-30 DIAGNOSIS — M545 Low back pain, unspecified: Secondary | ICD-10-CM

## 2016-04-30 DIAGNOSIS — M25552 Pain in left hip: Secondary | ICD-10-CM | POA: Insufficient documentation

## 2016-04-30 DIAGNOSIS — M5137 Other intervertebral disc degeneration, lumbosacral region: Secondary | ICD-10-CM | POA: Insufficient documentation

## 2016-04-30 DIAGNOSIS — M4316 Spondylolisthesis, lumbar region: Secondary | ICD-10-CM | POA: Diagnosis not present

## 2016-04-30 DIAGNOSIS — M25559 Pain in unspecified hip: Secondary | ICD-10-CM

## 2016-04-30 DIAGNOSIS — M533 Sacrococcygeal disorders, not elsewhere classified: Secondary | ICD-10-CM | POA: Insufficient documentation

## 2016-04-30 DIAGNOSIS — M47816 Spondylosis without myelopathy or radiculopathy, lumbar region: Secondary | ICD-10-CM

## 2016-04-30 DIAGNOSIS — G8929 Other chronic pain: Secondary | ICD-10-CM

## 2016-04-30 LAB — BRAIN NATRIURETIC PEPTIDE: B NATRIURETIC PEPTIDE 5: 48 pg/mL (ref 0.0–100.0)

## 2016-04-30 LAB — CBC
HEMATOCRIT: 40 % (ref 35.0–47.0)
HEMOGLOBIN: 13.8 g/dL (ref 12.0–16.0)
MCH: 31.3 pg (ref 26.0–34.0)
MCHC: 34.6 g/dL (ref 32.0–36.0)
MCV: 90.5 fL (ref 80.0–100.0)
Platelets: 184 10*3/uL (ref 150–440)
RBC: 4.42 MIL/uL (ref 3.80–5.20)
RDW: 16.1 % — AB (ref 11.5–14.5)
WBC: 9.5 10*3/uL (ref 3.6–11.0)

## 2016-04-30 LAB — MAGNESIUM: MAGNESIUM: 1.9 mg/dL (ref 1.7–2.4)

## 2016-04-30 LAB — C-REACTIVE PROTEIN: CRP: 1.3 mg/dL — AB (ref <1.0)

## 2016-04-30 LAB — SEDIMENTATION RATE: Sed Rate: 31 mm/hr — ABNORMAL HIGH (ref 0–30)

## 2016-04-30 LAB — VITAMIN B12: Vitamin B-12: 248 pg/mL (ref 180–914)

## 2016-05-03 LAB — 25-HYDROXYVITAMIN D LCMS D2+D3: 25-HYDROXY, VITAMIN D: 15 ng/mL — AB

## 2016-05-03 LAB — 25-HYDROXY VITAMIN D LCMS D2+D3
25-Hydroxy, Vitamin D-2: <1 ng/mL
25-Hydroxy, Vitamin D-3: 15 ng/mL

## 2016-05-04 ENCOUNTER — Other Ambulatory Visit: Payer: Self-pay

## 2016-05-06 LAB — COMPLIANCE DRUG ANALYSIS, UR

## 2016-05-16 NOTE — Progress Notes (Signed)
Results were reviewed and found to be: mildly abnormal  No acute injury or pathology identified  Review would suggest interventional pain management techniques may be of benefit 

## 2016-05-16 NOTE — Progress Notes (Signed)

## 2016-05-16 NOTE — Progress Notes (Signed)
-  A normal sedimentation rate should be below 30 mm/hr. The sed rate is an acute phase reactant that indirectly measures the degree of inflammation present in the body. It can be acute, developing rapidly after trauma, injury or infection, for example, or can occur over an extended time (chronic) with conditions such as autoimmune diseases or cancer. The ESR is not diagnostic; it is a non-specific, screening test that may be elevated in a number of these different conditions. It provides general information about the presence or absence of an inflammatory condition.   Normal CRP (C-reactive protein) Level(s): Less than <1.0 mg/dL. Elevated level(s): Levels above 1.0 mg/dL. Clinical significance: C-reactive protein (CRP) is produced by the liver. The level of CRP rises when there is inflammation throughout the body. CRP goes up in response to inflammation. High levels suggests the presence of chronic inflammation but do not identify its location or cause. Drops of previously elevated levels suggest that the inflammation or infection is subsiding and/or responding to treatment. Possible causes: High levels have been observed in obese patients, individuals with bacterial infections, chronic inflammation, or flare-ups of inflammatory conditions. Recommendations: - Consider the use of anti-inflammatory diet and medications. - The combined elevation of the ESR & CRP, may be suggestive of an autoimmune disease. Patients with elevated levels of both should consider an evaluation by a Rheumatologist.

## 2016-05-21 ENCOUNTER — Telehealth: Payer: Self-pay | Admitting: *Deleted

## 2016-05-31 ENCOUNTER — Encounter: Payer: Self-pay | Admitting: Family

## 2016-05-31 ENCOUNTER — Ambulatory Visit: Payer: Medicaid Other | Attending: Family | Admitting: Family

## 2016-05-31 VITALS — BP 139/71 | HR 80 | Resp 18 | Ht 68.0 in | Wt 195.0 lb

## 2016-05-31 DIAGNOSIS — Z803 Family history of malignant neoplasm of breast: Secondary | ICD-10-CM | POA: Diagnosis not present

## 2016-05-31 DIAGNOSIS — Z8719 Personal history of other diseases of the digestive system: Secondary | ICD-10-CM | POA: Insufficient documentation

## 2016-05-31 DIAGNOSIS — Z7982 Long term (current) use of aspirin: Secondary | ICD-10-CM | POA: Insufficient documentation

## 2016-05-31 DIAGNOSIS — Z23 Encounter for immunization: Secondary | ICD-10-CM | POA: Insufficient documentation

## 2016-05-31 DIAGNOSIS — J441 Chronic obstructive pulmonary disease with (acute) exacerbation: Secondary | ICD-10-CM | POA: Insufficient documentation

## 2016-05-31 DIAGNOSIS — Z885 Allergy status to narcotic agent status: Secondary | ICD-10-CM | POA: Insufficient documentation

## 2016-05-31 DIAGNOSIS — I110 Hypertensive heart disease with heart failure: Secondary | ICD-10-CM | POA: Insufficient documentation

## 2016-05-31 DIAGNOSIS — M1990 Unspecified osteoarthritis, unspecified site: Secondary | ICD-10-CM | POA: Diagnosis not present

## 2016-05-31 DIAGNOSIS — F17210 Nicotine dependence, cigarettes, uncomplicated: Secondary | ICD-10-CM | POA: Insufficient documentation

## 2016-05-31 DIAGNOSIS — Z801 Family history of malignant neoplasm of trachea, bronchus and lung: Secondary | ICD-10-CM | POA: Insufficient documentation

## 2016-05-31 DIAGNOSIS — I5032 Chronic diastolic (congestive) heart failure: Secondary | ICD-10-CM | POA: Insufficient documentation

## 2016-05-31 DIAGNOSIS — F17200 Nicotine dependence, unspecified, uncomplicated: Secondary | ICD-10-CM

## 2016-05-31 DIAGNOSIS — Z880 Allergy status to penicillin: Secondary | ICD-10-CM | POA: Insufficient documentation

## 2016-05-31 DIAGNOSIS — K0889 Other specified disorders of teeth and supporting structures: Secondary | ICD-10-CM

## 2016-05-31 DIAGNOSIS — Z833 Family history of diabetes mellitus: Secondary | ICD-10-CM | POA: Diagnosis not present

## 2016-05-31 DIAGNOSIS — G4733 Obstructive sleep apnea (adult) (pediatric): Secondary | ICD-10-CM | POA: Diagnosis not present

## 2016-05-31 DIAGNOSIS — E785 Hyperlipidemia, unspecified: Secondary | ICD-10-CM | POA: Diagnosis not present

## 2016-05-31 DIAGNOSIS — K047 Periapical abscess without sinus: Secondary | ICD-10-CM | POA: Insufficient documentation

## 2016-05-31 DIAGNOSIS — I272 Pulmonary hypertension, unspecified: Secondary | ICD-10-CM | POA: Insufficient documentation

## 2016-05-31 MED ORDER — AMOXICILLIN 500 MG PO CAPS: 500.0000 mg | ORAL_CAPSULE | Freq: Three times a day (TID) | ORAL | 0 refills | Status: DC

## 2016-05-31 NOTE — Progress Notes (Signed)
Patient ID: Erica [name], female    DOB: [birth year], 50 y.o.   MRN: [id number]  HPI  Erica [name] is a 50 y/o female with a history of obstructive sleep apnea, HTN, hyperlipidemia, COPD, asthma, pulmonary HTN, chronic tobacco use and chronic heart failure.  Last echo was done on 06/14/15 and showed an EF of 60-65% with moderately elevated PA pressure at 56 mm Hg. Last PFT's were done 04/19/16. Recently had a chest CT done on 04/27/16 due to ground glass infiltrate.   Was last in the ER on 10/03/15 with an upper respiratory tract infection. She was treated with prednisone taper and released. Last admission was on 06/13/15 due to COPD exacerbation and hyponatremia. Had a bronchoscopy done 06/20/15. Was treated with antibiotics, nebulizer, hypertonic solution and bicarbonate drip with improvement of her sodium.   She presents today for a follow-up visit with shortness of breath and fatigue upon exertion. Does have minimal swelling in her lower legs. Has been weighing daily and has lost a few pounds. Not adding salt to her food. Is having quite a bit of dental pain as she's had a dental abscess before. Has recently started chantix and is doing well with that with very infrequent use of cigarettes.   Past Medical History:  Diagnosis Date  . Arthritis   . Asthma   . CHF (congestive heart failure) (HCC)   . COPD (chronic obstructive pulmonary disease) (HCC)   . Gastric ulcer   . Hyperlipidemia   . Hypertension   . OSA on CPAP no ins - no cpap machine for the last 4 years    Past Surgical History:  Procedure Laterality Date  . FLEXIBLE BRONCHOSCOPY N/A 06/20/2015   Procedure: FLEXIBLE BRONCHOSCOPY;  Surgeon: Shane CrutchPradeep Ramachandran, MD;  Location: [facility] ORS;  Service: Pulmonary;  Laterality: N/A;  . KNEE SURGERY Left    8 knee surgeries    Family History  Problem Relation Age of Onset  . Lung cancer Father   . Breast cancer Mother     early 7160's  . Diabetes Maternal Grandmother     Social  History  Substance Use Topics  . Smoking status: Current Every Day Smoker    Packs/day: 1.00    Years: 36.00    Types: Cigarettes    Last attempt to quit: 06/13/2015  . Smokeless tobacco: Never Used  . Alcohol use 0.0 oz/week     Comment: occasionaly    Allergies  Allergen Reactions  . Tramadol Other (See Comments)    Heart palpatations  . Penicillins Rash    Prior to Admission medications   Medication Sig Start Date End Date Taking? Authorizing Provider  albuterol (PROAIR HFA) 108 (90 Base) MCG/ACT inhaler Inhale into the lungs 4 (four) times daily.   Yes Historical Provider, MD  aspirin EC 81 MG EC tablet Take 1 tablet (81 mg total) by mouth daily. 06/23/15  Yes Shaune PollackQing Chen, MD  atorvastatin (LIPITOR) 20 MG tablet Take 1 tablet (20 mg total) by mouth daily at 6 PM. 06/23/15  Yes Shaune PollackQing Chen, MD  diltiazem (CARDIZEM) 30 MG tablet Take 1 tablet (30 mg total) by mouth 3 (three) times daily as needed. 04/22/16  Yes Antonieta Ibaimothy J Gollan, MD  DULoxetine (CYMBALTA) 60 MG capsule Take 60 mg by mouth daily.   Yes Historical Provider, MD  Fluticasone-Salmeterol (ADVAIR DISKUS IN) Inhale into the lungs 2 (two) times daily.   Yes Historical Provider, MD  folic acid (FOLVITE) 1 MG tablet Take 1 mg by  mouth daily.   Yes Historical Provider, MD  furosemide (LASIX) 40 MG tablet Take 1 tablet (40 mg total) by mouth daily. Hold if SBP<110 or DBP<60 07/16/15  Yes Delma Freezeina A Hackney, FNP  HYDROcodone-acetaminophen (NORCO/VICODIN) 5-325 MG tablet Take 1 tablet by mouth every 6 (six) hours as needed for moderate pain.   Yes Historical Provider, MD  pantoprazole (PROTONIX) 40 MG tablet Take 1 tablet (40 mg total) by mouth daily. 06/23/15  Yes Shaune PollackQing Chen, MD  potassium chloride SA (K-DUR,KLOR-CON) 20 MEQ tablet Take 1 tablet (20 mEq total) by mouth daily. Patient taking differently: Take 20 mEq by mouth 2 (two) times daily.  07/16/15  Yes Delma Freezeina A Hackney, FNP  pregabalin (LYRICA) 50 MG capsule Take 50 mg by mouth 3 (three)  times daily.   Yes Historical Provider, MD  sucralfate (CARAFATE) 1 G tablet Take 1 tablet (1 g total) by mouth 4 (four) times daily -  with meals and at bedtime. 06/23/15  Yes Shaune PollackQing Chen, MD  tiotropium (SPIRIVA HANDIHALER) 18 MCG inhalation capsule Place 18 mcg into inhaler and inhale daily.   Yes Historical Provider, MD  varenicline (CHANTIX PAK) 0.5 MG X 11 & 1 MG X 42 tablet Take 1 mg by mouth 2 (two) times daily. Take one 0.5 mg tablet by mouth once daily for 3 days, then increase to one 0.5 mg tablet twice daily for 4 days, then increase to one 1 mg tablet twice daily.   Yes Historical Provider, MD  amoxicillin (AMOXIL) 500 MG capsule Take 1 capsule (500 mg total) by mouth 3 (three) times daily. 06/01/16   Delma Freezeina A Hackney, FNP     Review of Systems  Constitutional: Positive for fatigue. Negative for appetite change and fever.  HENT: Positive for congestion and dental problem. Negative for postnasal drip and sore throat.   Eyes: Negative.   Respiratory: Positive for cough (green sputum) and shortness of breath. Negative for chest tightness.   Cardiovascular: Positive for leg swelling. Negative for chest pain and palpitations.  Gastrointestinal: Negative for abdominal distention and abdominal pain.  Endocrine: Negative.   Genitourinary: Negative.   Musculoskeletal: Positive for back pain. Negative for neck pain.  Skin: Negative.   Allergic/Immunologic: Negative.   Neurological: Negative for dizziness and light-headedness.  Hematological: Negative for adenopathy. Does not bruise/bleed easily.  Psychiatric/Behavioral: Positive for dysphoric mood and sleep disturbance (chronic insomnia).   Vitals:   05/31/16 1317  BP: 139/71  Pulse: 80  Resp: 18  SpO2: 99%  Weight: 195 lb (88.5 kg)  Height: 5\' 8"  (1.727 m)    Wt Readings from Last 3 Encounters:  05/31/16 195 lb (88.5 kg)  04/29/16 190 lb (86.2 kg)  04/22/16 192 lb (87.1 kg)    Lab Results  Component Value Date   CREATININE  0.65 04/22/2016   CREATININE 0.73 06/23/2015   CREATININE 0.87 06/22/2015    Physical Exam  Constitutional: She is oriented to person, place, and time. She appears well-developed and well-nourished.  HENT:  Head: Normocephalic and atraumatic.  Mouth/Throat: Abnormal dentition.  Eyes: Conjunctivae are normal. Pupils are equal, round, and reactive to light.  Neck: Normal range of motion. Neck supple. No JVD present.  Cardiovascular: Normal rate and regular rhythm.   Pulmonary/Chest: Effort normal. She has no wheezes. She has no rales.  Abdominal: Soft. She exhibits no distension. There is no tenderness.  Musculoskeletal: She exhibits edema (trace edema in bilateral lower legs). She exhibits no tenderness.  Neurological: She is alert and oriented  to person, place, and time.  Skin: Skin is warm and dry.  Psychiatric: She has a normal mood and affect. Her behavior is normal. Thought content normal.  Nursing note and vitals reviewed.    Assessment & Plan:  1: Chronic heart failure with preserved ejection fraction- - NYHA class II today - euvolemic - continue daily weights. Has lost 4.4 pounds since she was last here August 2017. Reminded to call for an overnight weight gain of >2 pounds or a weekly weight gain of >5 pounds - received her flu vaccine for this season - saw cardiologist Mariah Milling) on 04/22/16  2: Dental pain- - will treat with amoxil. Patient has a PCN allergy but she confirms that she has taken amoxil in the past without any reaction.  - now has medicaid and is going to call local dentists to see who is taking medicaid so that she can get her remaining lower teeth removed.  - already has an implant on the upper teeth  3: Pulmonary HTN- - recently had a CT. She reports that there's no malignancy - saw pulmonologist on 05/10/16 and returns to him on 08/10/16  4: Tobacco use- - now using chantix - smokes infrequent now and understands that she needs to change her habits  and find something else to do with her hands - complete cessation discussed for 3 minutes with her  Return here in 3 months or sooner for any questions/problems before then.

## 2016-05-31 NOTE — Patient Instructions (Addendum)
Continue weighing daily and call for an overnight weight gain of > 2 pounds or a weekly weight gain of >5 pounds.  Take the entire course of amoxil for dental pain.

## 2016-06-01 MED ORDER — AMOXICILLIN 500 MG PO CAPS: 500.0000 mg | ORAL_CAPSULE | Freq: Three times a day (TID) | ORAL | 0 refills | Status: DC

## 2016-06-14 ENCOUNTER — Emergency Department
Admission: EM | Admit: 2016-06-14 | Discharge: 2016-06-14 | Disposition: A | Discharge status: Home / Self Care | Payer: Medicaid Other | Attending: Emergency Medicine | Admitting: Emergency Medicine

## 2016-06-14 ENCOUNTER — Emergency Department: Payer: Medicaid Other

## 2016-06-14 DIAGNOSIS — J449 Chronic obstructive pulmonary disease, unspecified: Secondary | ICD-10-CM | POA: Diagnosis not present

## 2016-06-14 DIAGNOSIS — I110 Hypertensive heart disease with heart failure: Secondary | ICD-10-CM | POA: Diagnosis not present

## 2016-06-14 DIAGNOSIS — Z79899 Other long term (current) drug therapy: Secondary | ICD-10-CM | POA: Insufficient documentation

## 2016-06-14 DIAGNOSIS — F17210 Nicotine dependence, cigarettes, uncomplicated: Secondary | ICD-10-CM | POA: Diagnosis not present

## 2016-06-14 DIAGNOSIS — Z7982 Long term (current) use of aspirin: Secondary | ICD-10-CM | POA: Diagnosis not present

## 2016-06-14 DIAGNOSIS — R0602 Shortness of breath: Secondary | ICD-10-CM | POA: Diagnosis present

## 2016-06-14 DIAGNOSIS — J45909 Unspecified asthma, uncomplicated: Secondary | ICD-10-CM | POA: Insufficient documentation

## 2016-06-14 DIAGNOSIS — I5032 Chronic diastolic (congestive) heart failure: Secondary | ICD-10-CM | POA: Diagnosis not present

## 2016-06-14 LAB — COMPREHENSIVE METABOLIC PANEL
ALK PHOS: 83 U/L (ref 38–126)
ALT: 26 U/L (ref 14–54)
ANION GAP: 9 (ref 5–15)
AST: 27 U/L (ref 15–41)
Albumin: 4.2 g/dL (ref 3.5–5.0)
BUN: 14 mg/dL (ref 6–20)
CALCIUM: 9.4 mg/dL (ref 8.9–10.3)
CO2: 26 mmol/L (ref 22–32)
Chloride: 101 mmol/L (ref 101–111)
Creatinine, Ser: 0.95 mg/dL (ref 0.44–1.00)
GFR calc non Af Amer: >60 mL/min (ref >60)
Glucose, Bld: 82 mg/dL (ref 65–99)
Potassium: 3.4 mmol/L — ABNORMAL LOW (ref 3.5–5.1)
SODIUM: 136 mmol/L (ref 135–145)
TOTAL PROTEIN: 7.8 g/dL (ref 6.5–8.1)
Total Bilirubin: 0.7 mg/dL (ref 0.3–1.2)

## 2016-06-14 LAB — CBC
HCT: 40.7 % (ref 35.0–47.0)
HEMOGLOBIN: 13.9 g/dL (ref 12.0–16.0)
MCH: 30.4 pg (ref 26.0–34.0)
MCHC: 34.2 g/dL (ref 32.0–36.0)
MCV: 88.8 fL (ref 80.0–100.0)
PLATELETS: 249 10*3/uL (ref 150–440)
RBC: 4.58 MIL/uL (ref 3.80–5.20)
RDW: 15.9 % — ABNORMAL HIGH (ref 11.5–14.5)
WBC: 13.1 10*3/uL — ABNORMAL HIGH (ref 3.6–11.0)

## 2016-06-14 LAB — BRAIN NATRIURETIC PEPTIDE: B NATRIURETIC PEPTIDE 5: 42 pg/mL (ref 0.0–100.0)

## 2016-06-14 LAB — TROPONIN I

## 2016-06-14 MED ORDER — FUROSEMIDE 10 MG/ML IJ SOLN
40.0000 mg | Freq: Once | INTRAMUSCULAR | Status: AC
  Administered 2016-06-14: 40 mg via INTRAVENOUS
  Filled 2016-06-14: qty 4

## 2016-06-14 NOTE — ED Notes (Signed)
Patient ambulated to and from the room commode with a steady gait. Patient states she has gone to the bathroom 5-6 times since being given the Lasix.

## 2016-06-14 NOTE — ED Notes (Signed)
Pt presents with sob increasing in intensity for last 2 weeks; much worse x 2-3 days, accompanied by cp. She states that she has been awakening the last 3 nights gasping for breath. Pt has hx of asthma, copd, chg. Pt states that she also has hx of panic attacks, which are increased when she awakens with difficulty breathing. NAD noted.

## 2016-06-14 NOTE — ED Provider Notes (Signed)
[facility] Emergency Department Provider Note  ____________________________________________  Time seen: Approximately 12:11 PM  I have reviewed the triage vital signs and the nursing notes.   HISTORY  Chief Complaint Chest Pain; Shortness of Breath; and Nasal Congestion    HPI Royetta AsalDonna A Heavrin is a 50 y.o. female who complains of shortness of breath, worse at night, worsening over the past 2-3 days. She has a history of CHF, specifically on Lasix daily, last filled at about 2 months ago. She reports that she ran out about 5 days ago. She is scheduled to see her primary care clinic in 2 days where she'll get a refill of her Lasix. She requests something for anxiety she feels like she is having a panic attack when she wakes up in the middle of the night having to catch her breath. No dyspnea on exertion. No chest pain. No cough or fever.     Past Medical History:  Diagnosis Date  . Arthritis   . Asthma   . CHF (congestive heart failure) (HCC)   . COPD (chronic obstructive pulmonary disease) (HCC)   . Gastric ulcer   . Hyperlipidemia   . Hypertension   . OSA on CPAP no ins - no cpap machine for the last 4 years     Patient Active Problem List   Diagnosis Date Noted  . Chronic pain 04/29/2016  . Long term current use of opiate analgesic 04/29/2016  . Long term prescription opiate use 04/29/2016  . Opiate use 04/29/2016  . Chronic low back pain (Location of Primary Source of Pain) (Bilateral) (L>R) 04/29/2016  . Chronic hip pain (Location of Secondary source of pain) (Bilateral) (L>R) 04/29/2016  . Chronic knee pain (Location of Tertiary source of pain) (Bilateral) (L>R) 04/29/2016  . Chronic neck pain (Bilateral) (R>L) 04/29/2016  . Chronic upper back pain (midline) 04/29/2016  . Chronic foot pain (bottom of feet) (Bilateral) (R>L) 04/29/2016  . Chronic hand pain (Bilateral) 04/29/2016  . Peripheral neuropathy, idiopathic (upper and lower extremity)  04/29/2016  . History of congestive heart failure 04/29/2016  . Chronic sacroiliac joint pain (Bilateral) (R>L) 04/29/2016  . Lumbar facet syndrome (Bilateral) (R>L) 04/29/2016  . Depression 03/05/2016  . Chronic diastolic heart failure (HCC) 07/16/2015  . Smoker 07/16/2015  . Obstructive sleep apnea 07/16/2015  . Tachycardia 07/16/2015  . COPD, mild (HCC)   . Renal insufficiency   . Pulmonary HTN   . Hyponatremia 06/14/2015  . Shortness of breath   . Swelling   . Cough   . COPD (chronic obstructive pulmonary disease) with acute bronchitis (HCC)      Past Surgical History:  Procedure Laterality Date  . FLEXIBLE BRONCHOSCOPY N/A 06/20/2015   Procedure: FLEXIBLE BRONCHOSCOPY;  Surgeon: Shane CrutchPradeep Ramachandran, MD;  Location: [facility] ORS;  Service: Pulmonary;  Laterality: N/A;  . KNEE SURGERY Left    8 knee surgeries     Prior to Admission medications   Medication Sig Start Date End Date Taking? Authorizing Provider  albuterol (PROAIR HFA) 108 (90 Base) MCG/ACT inhaler Inhale into the lungs 4 (four) times daily.    Historical Provider, MD  amoxicillin (AMOXIL) 500 MG capsule Take 1 capsule (500 mg total) by mouth 3 (three) times daily. 06/01/16   Delma Freezeina A Hackney, FNP  aspirin EC 81 MG EC tablet Take 1 tablet (81 mg total) by mouth daily. 06/23/15   Shaune PollackQing Chen, MD  atorvastatin (LIPITOR) 20 MG tablet Take 1 tablet (20 mg total) by mouth daily at 6  PM. 06/23/15   Shaune Pollack, MD  diltiazem (CARDIZEM) 30 MG tablet Take 1 tablet (30 mg total) by mouth 3 (three) times daily as needed. 04/22/16   Antonieta Iba, MD  DULoxetine (CYMBALTA) 60 MG capsule Take 60 mg by mouth daily.    Historical Provider, MD  Fluticasone-Salmeterol (ADVAIR DISKUS IN) Inhale into the lungs 2 (two) times daily.    Historical Provider, MD  folic acid (FOLVITE) 1 MG tablet Take 1 mg by mouth daily.    Historical Provider, MD  furosemide (LASIX) 40 MG tablet Take 1 tablet (40 mg total) by mouth daily. Hold if SBP<110 or  DBP<60 07/16/15   Delma Freeze, FNP  HYDROcodone-acetaminophen (NORCO/VICODIN) 5-325 MG tablet Take 1 tablet by mouth every 6 (six) hours as needed for moderate pain.    Historical Provider, MD  pantoprazole (PROTONIX) 40 MG tablet Take 1 tablet (40 mg total) by mouth daily. 06/23/15   Shaune Pollack, MD  potassium chloride SA (K-DUR,KLOR-CON) 20 MEQ tablet Take 20 mEq by mouth 2 (two) times daily.    Historical Provider, MD  pregabalin (LYRICA) 50 MG capsule Take 50 mg by mouth 3 (three) times daily.    Historical Provider, MD  sucralfate (CARAFATE) 1 G tablet Take 1 tablet (1 g total) by mouth 4 (four) times daily -  with meals and at bedtime. 06/23/15   Shaune Pollack, MD  tiotropium (SPIRIVA HANDIHALER) 18 MCG inhalation capsule Place 18 mcg into inhaler and inhale daily.    Historical Provider, MD  varenicline (CHANTIX PAK) 0.5 MG X 11 & 1 MG X 42 tablet Take 1 mg by mouth 2 (two) times daily. Take one 0.5 mg tablet by mouth once daily for 3 days, then increase to one 0.5 mg tablet twice daily for 4 days, then increase to one 1 mg tablet twice daily.    Historical Provider, MD     Allergies Tramadol and Penicillins   Family History  Problem Relation Age of Onset  . Lung cancer Father   . Breast cancer Mother     early 62's  . Diabetes Maternal Grandmother     Social History Social History  Substance Use Topics  . Smoking status: Current Every Day Smoker    Packs/day: 1.00    Years: 36.00    Types: Cigarettes    Last attempt to quit: 06/13/2015  . Smokeless tobacco: Never Used  . Alcohol use 0.0 oz/week     Comment: occasionaly    Review of Systems  Constitutional:   No fever or chills.  ENT:   No sore throat. No rhinorrhea. Cardiovascular:   No chest pain. Respiratory:   Positive shortness of breath intermittently at night. Gastrointestinal:   Negative for abdominal pain, vomiting and diarrhea.  Genitourinary:   Negative for dysuria or difficulty urinating. Musculoskeletal:    Negative for focal pain or swelling Neurological:   Negative for headaches 10-point ROS otherwise negative.  ____________________________________________   PHYSICAL EXAM:  VITAL SIGNS: ED Triage Vitals  Enc Vitals Group     BP 06/14/16 0840 (!) 144/72     Pulse Rate 06/14/16 0840 74     Resp 06/14/16 0840 16     Temp 06/14/16 0840 98 F (36.7 C)     Temp Source 06/14/16 0840 Oral     SpO2 06/14/16 0840 99 %     Weight 06/14/16 0840 190 lb (86.2 kg)     Height 06/14/16 0840 5\' 8"  (1.727 m)  Head Circumference --      Peak Flow --      Pain Score 06/14/16 0850 3     Pain Loc --      Pain Edu? --      Excl. in GC? --     Vital signs reviewed, nursing assessments reviewed.   Constitutional:   Alert and oriented. Well appearing and in no distress. Eyes:   No scleral icterus. No conjunctival pallor. PERRL. EOMI.  No nystagmus. ENT   Head:   Normocephalic and atraumatic.   Nose:   No congestion/rhinnorhea. No septal hematoma   Mouth/Throat:   MMM, no pharyngeal erythema. No peritonsillar mass.    Neck:   No stridor. No SubQ emphysema. No meningismus. No JVD Hematological/Lymphatic/Immunilogical:   No cervical lymphadenopathy. Cardiovascular:   RRR. Symmetric bilateral radial and DP pulses.  No murmurs.  Respiratory:   Normal respiratory effort without tachypnea nor retractions. Breath sounds are clear and equal bilaterally. Slight basilar crackles bilaterally. Gastrointestinal:   Soft and nontender. Non distended. There is no CVA tenderness.  No rebound, rigidity, or guarding. Genitourinary:   deferred Musculoskeletal:   Nontender with normal range of motion in all extremities. No joint effusions.  No lower extremity tenderness.  No edema. Large knee effusion on the left without tenderness. Normal range of motion. Patella is not ballotable. Neurologic:   Normal speech and language.  CN 2-10 normal. Motor grossly intact. No gross focal neurologic deficits are  appreciated.  Skin:    Skin is warm, dry and intact. No rash noted.  No petechiae, purpura, or bullae.  ____________________________________________    LABS (pertinent positives/negatives) (all labs ordered are listed, but only abnormal results are displayed) Labs Reviewed  CBC - Abnormal; Notable for the following:       Result Value   WBC 13.1 (*)    RDW 15.9 (*)    All other components within normal limits  COMPREHENSIVE METABOLIC PANEL - Abnormal; Notable for the following:    Potassium 3.4 (*)    All other components within normal limits  BRAIN NATRIURETIC PEPTIDE  TROPONIN I   ____________________________________________   EKG  Interpreted by me Normal sinus rhythm rate of 75, normal axis and intervals. Normal QRS ST segments and T waves.  ____________________________________________    RADIOLOGY    ____________________________________________   PROCEDURES Procedures  ____________________________________________   INITIAL IMPRESSION / ASSESSMENT AND PLAN / ED COURSE  Pertinent labs & imaging results that were available during my care of the patient were reviewed by me and considered in my medical decision making (see chart for details).  Patient presents with symptoms suggestive of paroxysmal nocturnal dyspnea. This is consistent with her apparent Lasix noncompliance. She will have this refilled in 2 days. We'll give IV Lasix here in the ED. Labs and chest x-ray are unremarkable. She is not clinically volume overloaded. No respiratory compromise. Oxygenation is 100%. The knee appears to be a chronic effusion, no evidence of septic arthritis or acute trauma. Advised patient to follow up with her primary care doctor after resuming Lasix to reassess symptoms and need for possible anxiety medications.     Clinical Course    ____________________________________________   FINAL CLINICAL IMPRESSION(S) / ED DIAGNOSES  Final diagnoses:  Chronic diastolic  congestive heart failure (HCC)  Dyspnea     Portions of this note were generated with dragon dictation software. Dictation errors may occur despite best attempts at proofreading.    Sharman CheekPhillip Yolando Gillum, MD 06/14/16 1215

## 2016-06-14 NOTE — ED Triage Notes (Signed)
Pt arrives from home with reports of chest pain that has been recurrent over the last couple of days pt reports feeling short of breath at times and has been awakened from sleep not being able to catch her breath.  Non productive congested cough present

## 2016-06-22 ENCOUNTER — Other Ambulatory Visit: Payer: Self-pay | Admitting: Pain Medicine

## 2016-06-28 DIAGNOSIS — R700 Elevated erythrocyte sedimentation rate: Secondary | ICD-10-CM | POA: Insufficient documentation

## 2016-06-28 DIAGNOSIS — M792 Neuralgia and neuritis, unspecified: Secondary | ICD-10-CM | POA: Insufficient documentation

## 2016-06-28 DIAGNOSIS — R7982 Elevated C-reactive protein (CRP): Secondary | ICD-10-CM | POA: Insufficient documentation

## 2016-06-28 DIAGNOSIS — G894 Chronic pain syndrome: Secondary | ICD-10-CM | POA: Insufficient documentation

## 2016-06-28 DIAGNOSIS — E559 Vitamin D deficiency, unspecified: Secondary | ICD-10-CM | POA: Insufficient documentation

## 2016-06-28 NOTE — Progress Notes (Signed)
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 No acute fracture is seen. No definite joint effusion is noted. Screws remain within the proximal left tibia.  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   Electronically Signed   By: Ivar Drape M.D.   On: 11/25/2015 11:03    Note: Results of ordered imaging test(s) reviewed and explained to patient in Layman's terms.  Copy of results provided to patient  Meds  The patient has a current medication list which includes the following prescription(s): albuterol, aspirin, atorvastatin, diltiazem, duloxetine, fluticasone-salmeterol, folic acid, furosemide, pantoprazole, potassium chloride sa, sucralfate, tiotropium, varenicline, vitamin d3, hydrocodone-acetaminophen, pregabalin, and vitamin d (ergocalciferol).  Current Outpatient Prescriptions on File Prior to Visit  Medication Sig  . albuterol (PROAIR HFA) 108 (90 Base) MCG/ACT inhaler Inhale into the lungs 4 (four) times daily.  Marland Kitchen aspirin EC 81 MG EC tablet Take 1 tablet (81 mg total) by mouth daily.  Marland Kitchen atorvastatin (LIPITOR) 20 MG tablet Take 1 tablet (20 mg total) by mouth daily at 6 PM.  . diltiazem (CARDIZEM) 30 MG tablet Take 1 tablet (30 mg total) by mouth 3 (three) times daily as needed.  . DULoxetine (CYMBALTA) 60 MG capsule Take 60 mg by mouth daily.  . Fluticasone-Salmeterol (ADVAIR DISKUS IN) Inhale into the lungs 2 (two) times daily.  . folic acid (FOLVITE) 1 MG tablet Take 1 mg by mouth daily.  . furosemide (LASIX) 40 MG tablet Take 1 tablet (40 mg total) by mouth daily. Hold if SBP<110 or DBP<60  . pantoprazole (PROTONIX) 40 MG tablet Take 1 tablet (40 mg total) by mouth daily.  . potassium chloride SA (K-DUR,KLOR-CON) 20 MEQ tablet Take 20 mEq by mouth 2 (two) times daily.  . sucralfate (CARAFATE) 1 G tablet Take 1 tablet (1 g total) by mouth 4 (four) times daily -  with meals and at bedtime.  Marland Kitchen tiotropium (SPIRIVA HANDIHALER) 18 MCG inhalation capsule Place 18 mcg into inhaler and inhale daily.  . varenicline (CHANTIX PAK) 0.5 MG X 11 & 1 MG X 42 tablet Take 1 mg by mouth 2 (two) times daily. Take one 0.5 mg tablet by mouth once daily for 3 days, then increase to one 0.5 mg tablet twice daily for 4 days, then increase to one 1 mg tablet twice daily.   No current facility-administered medications on file prior to visit.    ROS  Constitutional:  Denies any fever or chills Gastrointestinal: No reported hemesis, hematochezia, vomiting, or acute GI distress Musculoskeletal: Denies any acute onset joint swelling, redness, loss of ROM, or weakness Neurological: No reported episodes of acute onset apraxia, aphasia, dysarthria, agnosia, amnesia, paralysis, loss of coordination, or loss of consciousness  Allergies  Ms. Heidt is allergic to tramadol  and penicillins.  [place]  Drug: Ms. Mastandrea  reports that she does not use drugs. Alcohol:  reports that she drinks alcohol. Tobacco:  reports that she quit smoking about 12 months ago. Her smoking use included Cigarettes. She has a 36.00 pack-year smoking history. She has never used smokeless tobacco. Medical:  has a past medical history of Arthritis; Asthma; CHF (congestive heart failure) ([place]); COPD (chronic obstructive pulmonary disease) ([place]); Gastric ulcer; Hyperlipidemia; Hypertension; and OSA on CPAP (no ins - no cpap machine for the last 4 years). Family: family history includes Breast cancer in her mother; Diabetes in her maternal grandmother; Lung cancer in her father.  Past Surgical History:  Procedure Laterality Date  . FLEXIBLE BRONCHOSCOPY N/A 06/20/2015   Procedure: FLEXIBLE BRONCHOSCOPY;  Surgeon: Laverle Hobby, MD;  Location: [facility] ORS;  Service: Pulmonary;  Laterality: N/A;  . KNEE SURGERY Left    8 knee surgeries   Constitutional Exam  General appearance: Well nourished, well developed, and well hydrated. In no apparent acute distress Vitals:   06/29/16 0803  BP: 134/64  Pulse: 85  Resp: 16  Temp: 98.3 F (36.8 C)  TempSrc: Oral  SpO2: 99%  Weight: 190 lb (86.2 kg)  Height: 5' 8"  (1.727 m)   BMI Assessment: Estimated body mass index is 28.89 kg/m as calculated from the following:   Height as of this encounter: 5' 8"  (1.727 m).   Weight as of this encounter: 190 lb (86.2 kg).  BMI interpretation table: BMI level Category Range association with higher  incidence of chronic pain  <18 kg/m2 Underweight   18.5-24.9 kg/m2 Ideal body weight   25-29.9 kg/m2 Overweight Increased incidence by 20%  30-34.9 kg/m2 Obese (Class I) Increased incidence by 68%  35-39.9 kg/m2 Severe obesity (Class II) Increased incidence by 136%  >40 kg/m2 Extreme obesity (Class III) Increased incidence by 254%   BMI Readings from Last 4 Encounters:  06/29/16 28.89 kg/m  06/14/16 28.89 kg/m  05/31/16 29.65 kg/m  04/29/16 28.89 kg/m   Wt Readings from Last 4 Encounters:  06/29/16 190 lb (86.2 kg)  06/14/16 190 lb (86.2 kg)  05/31/16 195 lb (88.5 kg)  04/29/16 190 lb (86.2 kg)  Psych/Mental status: Alert, oriented x 3 (person, place, & time) Eyes: PERLA Respiratory: No evidence of acute respiratory distress  Cervical Spine Exam  Inspection: No masses, redness, or swelling Alignment: Symmetrical Functional ROM: Unrestricted ROM Stability: No instability detected Muscle strength & Tone: Functionally intact Sensory: Unimpaired Palpation: Non-contributory  Upper Extremity (UE) Exam    Side: Right upper extremity  Side: Left upper extremity  Inspection: No masses, redness, swelling, or asymmetry  Inspection: No masses, redness, swelling, or asymmetry  Functional ROM: Unrestricted ROM          Functional ROM: Unrestricted ROM          Muscle strength & Tone: Functionally intact  Muscle strength & Tone: Functionally intact  Sensory: Unimpaired  Sensory: Unimpaired  Palpation: Non-contributory  Palpation: Non-contributory   Thoracic Spine Exam  Inspection: No masses, redness, or swelling Alignment: Symmetrical Functional ROM: Unrestricted ROM Stability: No instability detected Sensory: Unimpaired Muscle strength & Tone: Functionally intact Palpation: Non-contributory  Lumbar Spine Exam  Inspection: No masses, redness, or swelling Alignment: Symmetrical Functional ROM: Decreased ROM Stability: No instability detected Muscle strength & Tone:  Functionally intact Sensory: Movement-associated pain Palpation: Complains of area being tender to palpation Provocative Tests: Lumbar Hyperextension and rotation test: Positive bilaterally for facet joint pain. Patrick's Maneuver: Positive for bilateral  S-I joint pain           Gait & Posture Assessment  Ambulation: Unassisted Gait: Relatively normal for age and body habitus Posture: WNL   Lower Extremity Exam    Side: Right lower extremity  Side: Left lower extremity  Inspection: No masses, redness, swelling, or asymmetry  Inspection: No masses, redness, swelling, or asymmetry  Functional ROM: Unrestricted ROM          Functional ROM: Unrestricted ROM          Muscle strength & Tone: Functionally intact  Muscle strength & Tone: Functionally intact  Sensory: Unimpaired  Sensory: Unimpaired  Palpation: Non-contributory  Palpation: Non-contributory   Assessment & Plan  Primary Diagnosis & Pertinent Problem List: The primary encounter diagnosis was Chronic pain syndrome. Diagnoses of Chronic low back pain (Location of Primary Source of Pain) (Bilateral) (L>R), Chronic hip pain, unspecified laterality, Chronic knee pain (Location of Tertiary source of pain) (Bilateral) (L>R), Long term prescription opiate use, Opiate use, Elevated sedimentation rate, Elevated C-reactive protein (CRP), Lumbar facet syndrome (Bilateral) (R>L), Chronic sacroiliac joint pain (Bilateral) (R>L), Peripheral neuropathy, idiopathic (upper and lower extremity), Neurogenic pain, and Vitamin D deficiency were also pertinent to this visit.  Visit Diagnosis: 1. Chronic pain syndrome   2. Chronic low back pain (Location of Primary Source of Pain) (Bilateral) (L>R)   3. Chronic hip pain, unspecified laterality   4. Chronic knee pain (Location of Tertiary source of pain) (Bilateral) (L>R)   5. Long term prescription opiate use   6. Opiate use   7. Elevated sedimentation rate   8. Elevated C-reactive protein (CRP)   9.  Lumbar facet syndrome (Bilateral) (R>L)   10. Chronic sacroiliac joint pain (Bilateral) (R>L)   11. Peripheral neuropathy, idiopathic (upper and lower extremity)   12. Neurogenic pain   13. Vitamin D deficiency    Problems updated and reviewed during this visit: No problems updated. Problem-specific Plan(s): No problem-specific Assessment & Plan notes found for this encounter.  No new Assessment & Plan notes have been filed under this hospital service since the last note was generated. Service: Pain Management  Plan of Care  Pharmacotherapy (Medications Ordered): Meds ordered this encounter  Medications  . pregabalin (LYRICA) 50 MG capsule    Sig: Take 1 capsule (50 mg total) by mouth 3 (three) times daily.    Dispense:  90 capsule    Refill:  0    Do not add this medication to the electronic "Automatic Refill" notification system. Patient may have prescription filled one day early if pharmacy is closed on scheduled refill date.  Marland Kitchen HYDROcodone-acetaminophen (NORCO/VICODIN) 5-325 MG tablet    Sig: Take 1 tablet by mouth every 6 (six) hours as needed for moderate pain.    Dispense:  120 tablet    Refill:  0    Patient may have prescription filled one day early if pharmacy is closed on scheduled refill date. Do not fill until: 06/29/16 To last until: 07/29/16  . Cholecalciferol (VITAMIN D3) 2000 units capsule    Sig: Take 1 capsule (2,000 Units total) by mouth daily.    Dispense:  30 capsule    Refill:  PRN    Do not add to the "Automatic Refill" notification system.  . Vitamin D, Ergocalciferol, (DRISDOL) 50000 units CAPS capsule    Sig: Take 1 capsule (50,000 Units total) by mouth 2 (two) times a week. X 6 weeks.    Dispense:  12 capsule    Refill:  0    Do not add to the "Automatic Refill" notification system.   Lab-work, procedure(s), and/or referral(s): Orders Placed This Encounter  Procedures  . LUMBAR FACET(MEDIAL BRANCH NERVE BLOCK) MBNB  . ANA w/Reflex if Positive   . Rheumatoid factor    Pharmacotherapy: Opioid Analgesics: We'll take over management today. See above orders Membrane stabilizer: We have discussed the possibility of optimizing this mode of therapy, if tolerated Muscle relaxant: We have discussed the possibility of a trial NSAID: We have discussed the possibility of a trial Other analgesic(s): To be determined at a later time   Interventional therapies: Planned, scheduled, and/or pending:    Diagnostic bilateral lumbar facet block under fluoroscopic guidance and IV sedation    Considering:   Diagnostic bilateral lumbar facet blocks under fluoroscopic guidance and IV sedation.  Possible bilateral diagnostic sacroiliac joint block under fluoroscopic guidance and IV sedation.  Possible bilateral intra-articular hip joint injection under fluoroscopic guidance and IV sedation.  Possible bilateral intra-articular knee joint injection, without fluoroscopy or IV sedation.  Possible bilateral genicular nerve block under fluoroscopic guidance and IV sedation.  Possible bilateral knee joint radiofrequency ablation.  Possible left-sided lumbar epidural steroid injection under fluoroscopic guidance, with or without sedation.  Possible right-sided cervical epidural steroid injection under fluoroscopic guidance and IV sedation.  Possible bilateral diagnostic cervical facet block under fluoroscopic guidance and IV sedation.  Possible bilateral diagnostic greater occipital nerve blocks under fluoroscopic guidance, with or without sedation. Possible greater occipital nerve radiofrequency ablation.   PRN Procedures:   To be determined at a later time   Provider-requested follow-up: Return in about 1 month (around 07/30/2016) for Med-Mgmt, in addition, procedure (ASAA).  Future Appointments Date Time Provider [place]  07/05/2016 8:00 AM Milinda Pointer, MD [facility] None  08/31/2016 1:00 PM Alisa Graff, [place] [facility] None    Primary  Care Physician: [place] Location: [facility] Outpatient Pain Management Facility Note by: Kathlen Brunswick. Dossie Arbour, M.D, DABA, DABAPM, DABPM, DABIPP, FIPP Date: 06/29/16; Time: 9:52 AM  Pain Score Disclaimer: We use the NRS-11 scale. This is a self-reported, subjective measurement of pain severity with only modest accuracy. It is used primarily to identify changes within a particular patient. It must be understood that outpatient pain scales are significantly less accurate that those used for research, where they can be applied under ideal controlled circumstances with minimal exposure to variables. In reality, the score is likely to be a combination of pain intensity and pain affect, where pain affect describes the degree of emotional arousal or changes in action readiness caused by the sensory experience of pain. Factors such as social and work situation, setting, emotional state, anxiety levels, expectation, and prior pain experience may influence pain perception and show large inter-individual differences that may also be affected by time variables.  Patient instructions provided during this appointment: Patient Instructions  Pain Management Discharge Instructions  General Discharge Instructions :  If you need to reach your doctor call: Monday-Friday 8:00 am - 4:00 pm at [phone] or toll free [phone].  After clinic hours [phone] to have operator reach doctor.  Bring all of your medication bottles to all your appointments in the pain clinic.  To cancel or reschedule your appointment with Pain Management please remember to call 24 hours in advance to avoid a fee.  Refer to the educational materials which you have been given on: General Risks, I had my Procedure. Discharge Instructions, Post Sedation.  Post Procedure Instructions:  The drugs  you were given will stay in your system until tomorrow, so for the next 24 hours you should not drive, make any legal  decisions or drink any alcoholic beverages.  You may eat anything you prefer, but it is better to start with liquids then soups and crackers, and gradually work up to solid foods.  Please notify your doctor immediately if you have any unusual bleeding, trouble breathing or pain that is not related to your normal pain.  Depending on the type of procedure that was done, some parts of your body may feel week and/or numb.  This usually clears up by tonight or the next day.  Walk with the use of an assistive device or accompanied by an adult for the 24 hours.  You may use ice on the affected area for the first 24 hours.  Put ice in a Ziploc bag and cover with a towel and place against area 15 minutes on 15 minutes off.  You may switch to heat after 24 hours.Facet Joint Block The facet joints connect the bones of the spine (vertebrae). They make it possible for you to bend, twist, and make other movements with your spine. They also prevent you from overbending, overtwisting, and making other excessive movements.  A facet joint block is a procedure where a numbing medicine (anesthetic) is injected into a facet joint. Often, a type of anti-inflammatory medicine called a steroid is also injected. A facet joint block may be done for two reasons:   Diagnosis. A facet joint block may be done as a test to see whether neck or back pain is caused by a worn-down or infected facet joint. If the pain gets better after a facet joint block, it means the pain is probably coming from the facet joint. If the pain does not get better, it means the pain is probably not coming from the facet joint.   Therapy. A facet joint block may be done to relieve neck or back pain caused by a facet joint. A facet joint block is only done as a therapy if the pain does not improve with medicine, exercise programs, physical therapy, and other forms of pain management. LET [facility] CARE PROVIDER KNOW ABOUT:   Any allergies you have.    All medicines you are taking, including vitamins, herbs, eyedrops, and over-the-counter medicines and creams.   Previous problems you or members of your family have had with the use of anesthetics.   Any blood disorders you have had.   Other health problems you have. RISKS AND COMPLICATIONS Generally, having a facet joint block is safe. However, as with any procedure, complications can occur. Possible complications associated with having a facet joint block include:   Bleeding.   Injury to a nerve near the injection site.   Pain at the injection site.   Weakness or numbness in areas controlled by nerves near the injection site.   Infection.   Temporary fluid retention.   Allergic reaction to anesthetics or medicines used during the procedure. BEFORE THE PROCEDURE   Follow your health care provider's instructions if you are taking dietary supplements or medicines. You may need to stop taking them or reduce your dosage.   Do not take any new dietary supplements or medicines without asking your health care provider first.   Follow your health care provider's instructions about eating and drinking before the procedure. You may need to stop eating and drinking several hours before the procedure.   Arrange to have an  adult drive you home after the procedure. PROCEDURE  You may need to remove your clothing and dress in an open-back gown so that your health care provider can access your spine.   The procedure will be done while you are lying on an X-ray table. Most of the time you will be asked to lie on your stomach, but you may be asked to lie in a different position if an injection will be made in your neck.   Special machines will be used to monitor your oxygen levels, heart rate, and blood pressure.   If an injection will be made in your neck, an intravenous (IV) tube will be inserted into one of your veins. Fluids and medicine will flow directly into your  body through the IV tube.   The area over the facet joint where the injection will be made will be cleaned with an antiseptic soap. The surrounding skin will be covered with sterile drapes.   An anesthetic will be applied to your skin to make the injection area numb. You may feel a temporary stinging or burning sensation.   A video X-ray machine will be used to locate the joint. A contrast dye may be injected into the facet joint area to help with locating the joint.   When the joint is located, an anesthetic medicine will be injected into the joint through the needle.   Your health care provider will ask you whether you feel pain relief. If you do feel relief, a steroid may be injected to provide pain relief for a longer period of time. If you do not feel relief or feel only partial relief, additional injections of an anesthetic may be made in other facet joints.   The needle will be removed, the skin will be cleansed, and bandages will be applied.  AFTER THE PROCEDURE   You will be observed for 15-30 minutes before being allowed to go home. Do not drive. Have an adult drive you or take a taxi or public transportation instead.   If you feel pain relief, the pain will return in several hours or days when the anesthetic wears off.   You may feel pain relief 2-14 days after the procedure. The amount of time this relief lasts varies from person to person.   It is normal to feel some tenderness over the injected area(s) for 2 days following the procedure.   If you have diabetes, you may have a temporary increase in blood sugar. This information is not intended to replace advice given to you by your health care provider. Make sure you discuss any questions you have with your health care provider. Document Released: 11/24/2006 Document Revised: 07/26/2014 Document Reviewed: 03/31/2015 Elsevier Interactive Patient Education  [address] After Refer to  this sheet in the next few weeks. These instructions provide you with information on caring for yourself after your procedure. Your health care provider may also give you more specific instructions. Your treatment has been planned according to current medical practices, but problems sometimes occur. Call your health care provider if you have any problems or questions after your procedure. HOME CARE INSTRUCTIONS   Keep track of the amount of pain relief you feel and how long it lasts.  Limit pain medicine within the first 4-6 hours after the procedure as directed by your health care provider.  Resume taking dietary supplements and medicines as directed by your health care provider.  You may resume your regular diet.  Do not apply heat near or over the injection site(s) for 24 hours.   Do not take a bath or soak in water (such as a pool or lake) for 24 hours.  Do not drive for 24 hours unless approved by your health care provider.  Avoid strenuous activity for 24 hours.  Remove your bandages the morning after the procedure.   If the injection site is tender, applying an ice pack may relieve some tenderness. To do this:  Put ice in a bag.  Place a towel between your skin and the bag.  Leave the ice on for 15-20 minutes, 3-4 times a day.  Keep follow-up appointments as directed by your health care provider. SEEK MEDICAL CARE IF:   Your pain is not controlled by your medicines.   There is drainage from the injection site.   There is significant bleeding or swelling at the injection site.  You have diabetes and your blood sugar is above 180 mg/dL. SEEK IMMEDIATE MEDICAL CARE IF:   You develop a fever of 101F (38.3C) or greater.   You have worsening pain or swelling around the injection site.   You have red streaking around the injection site.   You develop severe pain that is not controlled by your medicines.   You develop a headache, stiff neck, nausea, or  vomiting.   Your eyes become very sensitive to light.   You have weakness, paralysis, or tingling in your arms or legs that was not present before the procedure.   You develop difficulty urinating or breathing.  This information is not intended to replace advice given to you by your health care provider. Make sure you discuss any questions you have with your health care provider. Document Released: 06/21/2012 Document Revised: 07/26/2014 Document Reviewed: 03/31/2015 Elsevier Interactive Patient Education  2017 [employer].

## 2016-06-29 ENCOUNTER — Other Ambulatory Visit
Admission: RE | Admit: 2016-06-29 | Discharge: 2016-06-29 | Disposition: A | Discharge status: Home / Self Care | Payer: Medicaid Other | Source: Ambulatory Visit | Attending: Pain Medicine | Admitting: Pain Medicine

## 2016-06-29 ENCOUNTER — Encounter: Payer: Self-pay | Admitting: Pain Medicine

## 2016-06-29 ENCOUNTER — Ambulatory Visit: Payer: Medicaid Other | Attending: Pain Medicine | Admitting: Pain Medicine

## 2016-06-29 VITALS — BP 134/64 | HR 85 | Temp 98.3°F | Resp 16 | Ht 68.0 in | Wt 190.0 lb

## 2016-06-29 DIAGNOSIS — F329 Major depressive disorder, single episode, unspecified: Secondary | ICD-10-CM | POA: Diagnosis not present

## 2016-06-29 DIAGNOSIS — M25559 Pain in unspecified hip: Secondary | ICD-10-CM

## 2016-06-29 DIAGNOSIS — M25562 Pain in left knee: Secondary | ICD-10-CM

## 2016-06-29 DIAGNOSIS — G894 Chronic pain syndrome: Secondary | ICD-10-CM

## 2016-06-29 DIAGNOSIS — M542 Cervicalgia: Secondary | ICD-10-CM | POA: Diagnosis not present

## 2016-06-29 DIAGNOSIS — E871 Hypo-osmolality and hyponatremia: Secondary | ICD-10-CM | POA: Diagnosis not present

## 2016-06-29 DIAGNOSIS — F1190 Opioid use, unspecified, uncomplicated: Secondary | ICD-10-CM

## 2016-06-29 DIAGNOSIS — I5032 Chronic diastolic (congestive) heart failure: Secondary | ICD-10-CM | POA: Diagnosis not present

## 2016-06-29 DIAGNOSIS — Z803 Family history of malignant neoplasm of breast: Secondary | ICD-10-CM | POA: Insufficient documentation

## 2016-06-29 DIAGNOSIS — N289 Disorder of kidney and ureter, unspecified: Secondary | ICD-10-CM | POA: Insufficient documentation

## 2016-06-29 DIAGNOSIS — M545 Low back pain: Secondary | ICD-10-CM | POA: Diagnosis not present

## 2016-06-29 DIAGNOSIS — M1611 Unilateral primary osteoarthritis, right hip: Secondary | ICD-10-CM | POA: Diagnosis not present

## 2016-06-29 DIAGNOSIS — R700 Elevated erythrocyte sedimentation rate: Secondary | ICD-10-CM | POA: Diagnosis present

## 2016-06-29 DIAGNOSIS — G609 Hereditary and idiopathic neuropathy, unspecified: Secondary | ICD-10-CM

## 2016-06-29 DIAGNOSIS — M25561 Pain in right knee: Secondary | ICD-10-CM

## 2016-06-29 DIAGNOSIS — R7982 Elevated C-reactive protein (CRP): Secondary | ICD-10-CM | POA: Diagnosis present

## 2016-06-29 DIAGNOSIS — M5137 Other intervertebral disc degeneration, lumbosacral region: Secondary | ICD-10-CM | POA: Insufficient documentation

## 2016-06-29 DIAGNOSIS — I272 Pulmonary hypertension, unspecified: Secondary | ICD-10-CM | POA: Insufficient documentation

## 2016-06-29 DIAGNOSIS — E785 Hyperlipidemia, unspecified: Secondary | ICD-10-CM | POA: Insufficient documentation

## 2016-06-29 DIAGNOSIS — Z801 Family history of malignant neoplasm of trachea, bronchus and lung: Secondary | ICD-10-CM | POA: Diagnosis not present

## 2016-06-29 DIAGNOSIS — Z87891 Personal history of nicotine dependence: Secondary | ICD-10-CM | POA: Insufficient documentation

## 2016-06-29 DIAGNOSIS — Z888 Allergy status to other drugs, medicaments and biological substances status: Secondary | ICD-10-CM | POA: Insufficient documentation

## 2016-06-29 DIAGNOSIS — Z880 Allergy status to penicillin: Secondary | ICD-10-CM | POA: Insufficient documentation

## 2016-06-29 DIAGNOSIS — M1288 Other specific arthropathies, not elsewhere classified, other specified site: Secondary | ICD-10-CM

## 2016-06-29 DIAGNOSIS — M549 Dorsalgia, unspecified: Secondary | ICD-10-CM | POA: Insufficient documentation

## 2016-06-29 DIAGNOSIS — Z7982 Long term (current) use of aspirin: Secondary | ICD-10-CM | POA: Insufficient documentation

## 2016-06-29 DIAGNOSIS — M533 Sacrococcygeal disorders, not elsewhere classified: Secondary | ICD-10-CM | POA: Diagnosis not present

## 2016-06-29 DIAGNOSIS — G4733 Obstructive sleep apnea (adult) (pediatric): Secondary | ICD-10-CM | POA: Insufficient documentation

## 2016-06-29 DIAGNOSIS — M47816 Spondylosis without myelopathy or radiculopathy, lumbar region: Secondary | ICD-10-CM

## 2016-06-29 DIAGNOSIS — G8929 Other chronic pain: Secondary | ICD-10-CM

## 2016-06-29 DIAGNOSIS — E559 Vitamin D deficiency, unspecified: Secondary | ICD-10-CM

## 2016-06-29 DIAGNOSIS — Z79891 Long term (current) use of opiate analgesic: Secondary | ICD-10-CM | POA: Diagnosis not present

## 2016-06-29 DIAGNOSIS — Z833 Family history of diabetes mellitus: Secondary | ICD-10-CM | POA: Insufficient documentation

## 2016-06-29 DIAGNOSIS — M792 Neuralgia and neuritis, unspecified: Secondary | ICD-10-CM

## 2016-06-29 MED ORDER — VITAMIN D3 50 MCG (2000 UT) PO CAPS: ORAL_CAPSULE | ORAL | 99 refills | Status: DC

## 2016-06-29 MED ORDER — VITAMIN D (ERGOCALCIFEROL) 1.25 MG (50000 UNIT) PO CAPS: ORAL_CAPSULE | ORAL | 0 refills | Status: DC

## 2016-06-29 MED ORDER — HYDROCODONE-ACETAMINOPHEN 5-325 MG PO TABS: 1.0000 | ORAL_TABLET | Freq: Four times a day (QID) | ORAL | 0 refills | Status: DC | PRN

## 2016-06-29 MED ORDER — PREGABALIN 50 MG PO CAPS: 50.0000 mg | ORAL_CAPSULE | Freq: Three times a day (TID) | ORAL | 0 refills | Status: DC

## 2016-06-29 NOTE — Progress Notes (Signed)
Results were reviewed and found to be: mildly abnormal  No acute injury or pathology identified. 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  Review would suggest interventional pain management techniques may be of benefit

## 2016-06-29 NOTE — Progress Notes (Signed)
Safety precautions to be maintained throughout the outpatient stay will include: orient to surroundings, keep bed in low position, maintain call bell within reach at all times, provide assistance with transfer out of bed and ambulation.  

## 2016-06-29 NOTE — Patient Instructions (Signed)
Pain Management Discharge Instructions  General Discharge Instructions :  If you need to reach your doctor call: Monday-Friday 8:00 am - 4:00 pm at [phone] or toll free [phone].  After clinic hours [phone] to have operator reach doctor.  Bring all of your medication bottles to all your appointments in the pain clinic.  To cancel or reschedule your appointment with Pain Management please remember to call 24 hours in advance to avoid a fee.  Refer to the educational materials which you have been given on: General Risks, I had my Procedure. Discharge Instructions, Post Sedation.  Post Procedure Instructions:  The drugs you were given will stay in your system until tomorrow, so for the next 24 hours you should not drive, make any legal decisions or drink any alcoholic beverages.  You may eat anything you prefer, but it is better to start with liquids then soups and crackers, and gradually work up to solid foods.  Please notify your doctor immediately if you have any unusual bleeding, trouble breathing or pain that is not related to your normal pain.  Depending on the type of procedure that was done, some parts of your body may feel week and/or numb.  This usually clears up by tonight or the next day.  Walk with the use of an assistive device or accompanied by an adult for the 24 hours.  You may use ice on the affected area for the first 24 hours.  Put ice in a Ziploc bag and cover with a towel and place against area 15 minutes on 15 minutes off.  You may switch to heat after 24 hours.Facet Joint Block The facet joints connect the bones of the spine (vertebrae). They make it possible for you to bend, twist, and make other movements with your spine. They also prevent you from overbending, overtwisting, and making other excessive movements.  A facet joint block is a procedure where a numbing medicine (anesthetic) is injected into a facet joint. Often, a type of anti-inflammatory  medicine called a steroid is also injected. A facet joint block may be done for two reasons:   Diagnosis. A facet joint block may be done as a test to see whether neck or back pain is caused by a worn-down or infected facet joint. If the pain gets better after a facet joint block, it means the pain is probably coming from the facet joint. If the pain does not get better, it means the pain is probably not coming from the facet joint.   Therapy. A facet joint block may be done to relieve neck or back pain caused by a facet joint. A facet joint block is only done as a therapy if the pain does not improve with medicine, exercise programs, physical therapy, and other forms of pain management. LET YOUR HEALTH CARE PROVIDER KNOW ABOUT:   Any allergies you have.   All medicines you are taking, including vitamins, herbs, eyedrops, and over-the-counter medicines and creams.   Previous problems you or members of your family have had with the use of anesthetics.   Any blood disorders you have had.   Other health problems you have. RISKS AND COMPLICATIONS Generally, having a facet joint block is safe. However, as with any procedure, complications can occur. Possible complications associated with having a facet joint block include:   Bleeding.   Injury to a nerve near the injection site.   Pain at the injection site.   Weakness or numbness in areas controlled by nerves near   the injection site.   Infection.   Temporary fluid retention.   Allergic reaction to anesthetics or medicines used during the procedure. BEFORE THE PROCEDURE   Follow your health care provider's instructions if you are taking dietary supplements or medicines. You may need to stop taking them or reduce your dosage.   Do not take any new dietary supplements or medicines without asking your health care provider first.   Follow your health care provider's instructions about eating and drinking before the  procedure. You may need to stop eating and drinking several hours before the procedure.   Arrange to have an adult drive you home after the procedure. PROCEDURE  You may need to remove your clothing and dress in an open-back gown so that your health care provider can access your spine.   The procedure will be done while you are lying on an X-ray table. Most of the time you will be asked to lie on your stomach, but you may be asked to lie in a different position if an injection will be made in your neck.   Special machines will be used to monitor your oxygen levels, heart rate, and blood pressure.   If an injection will be made in your neck, an intravenous (IV) tube will be inserted into one of your veins. Fluids and medicine will flow directly into your body through the IV tube.   The area over the facet joint where the injection will be made will be cleaned with an antiseptic soap. The surrounding skin will be covered with sterile drapes.   An anesthetic will be applied to your skin to make the injection area numb. You may feel a temporary stinging or burning sensation.   A video X-ray machine will be used to locate the joint. A contrast dye may be injected into the facet joint area to help with locating the joint.   When the joint is located, an anesthetic medicine will be injected into the joint through the needle.   Your health care provider will ask you whether you feel pain relief. If you do feel relief, a steroid may be injected to provide pain relief for a longer period of time. If you do not feel relief or feel only partial relief, additional injections of an anesthetic may be made in other facet joints.   The needle will be removed, the skin will be cleansed, and bandages will be applied.  AFTER THE PROCEDURE   You will be observed for 15-30 minutes before being allowed to go home. Do not drive. Have an adult drive you or take a taxi or public transportation instead.    If you feel pain relief, the pain will return in several hours or days when the anesthetic wears off.   You may feel pain relief 2-14 days after the procedure. The amount of time this relief lasts varies from person to person.   It is normal to feel some tenderness over the injected area(s) for 2 days following the procedure.   If you have diabetes, you may have a temporary increase in blood sugar. This information is not intended to replace advice given to you by your health care provider. Make sure you discuss any questions you have with your health care provider. Document Released: 11/24/2006 Document Revised: 07/26/2014 Document Reviewed: 03/31/2015 Elsevier Interactive Patient Education  2017 [employer] Facet Joint Block, Care After Refer to this sheet in the next few weeks. These instructions provide you with information on caring   for yourself after your procedure. Your health care provider may also give you more specific instructions. Your treatment has been planned according to current medical practices, but problems sometimes occur. Call your health care provider if you have any problems or questions after your procedure. HOME CARE INSTRUCTIONS   Keep track of the amount of pain relief you feel and how long it lasts.  Limit pain medicine within the first 4-6 hours after the procedure as directed by your health care provider.  Resume taking dietary supplements and medicines as directed by your health care provider.  You may resume your regular diet.  Do not apply heat near or over the injection site(s) for 24 hours.   Do not take a bath or soak in water (such as a pool or lake) for 24 hours.  Do not drive for 24 hours unless approved by your health care provider.  Avoid strenuous activity for 24 hours.  Remove your bandages the morning after the procedure.   If the injection site is tender, applying an ice pack may relieve some tenderness. To do this:  Put  ice in a bag.  Place a towel between your skin and the bag.  Leave the ice on for 15-20 minutes, 3-4 times a day.  Keep follow-up appointments as directed by your health care provider. SEEK MEDICAL CARE IF:   Your pain is not controlled by your medicines.   There is drainage from the injection site.   There is significant bleeding or swelling at the injection site.  You have diabetes and your blood sugar is above 180 mg/dL. SEEK IMMEDIATE MEDICAL CARE IF:   You develop a fever of 101F (38.3C) or greater.   You have worsening pain or swelling around the injection site.   You have red streaking around the injection site.   You develop severe pain that is not controlled by your medicines.   You develop a headache, stiff neck, nausea, or vomiting.   Your eyes become very sensitive to light.   You have weakness, paralysis, or tingling in your arms or legs that was not present before the procedure.   You develop difficulty urinating or breathing.  This information is not intended to replace advice given to you by your health care provider. Make sure you discuss any questions you have with your health care provider. Document Released: 06/21/2012 Document Revised: 07/26/2014 Document Reviewed: 03/31/2015 Elsevier Interactive Patient Education  2017 [employer]  

## 2016-06-30 ENCOUNTER — Telehealth: Payer: Self-pay | Admitting: Pain Medicine

## 2016-06-30 LAB — RHEUMATOID FACTOR: Rhuematoid fact SerPl-aCnc: <10 IU/mL (ref 0.0–13.9)

## 2016-06-30 LAB — ANA W/REFLEX IF POSITIVE: Anti Nuclear Antibody(ANA): NEGATIVE

## 2016-06-30 NOTE — Telephone Encounter (Signed)
Patient spoke with pharmacy and they informed her she would need prior auth for vicodin script before she could get it filled on Saturday.

## 2016-07-01 NOTE — Telephone Encounter (Signed)
Prior auth for Vicodin sent to [facility] per J. ShatleyRN 07/01/2016. Fax confirmed.

## 2016-07-05 ENCOUNTER — Encounter: Payer: Self-pay | Admitting: Pain Medicine

## 2016-07-05 ENCOUNTER — Ambulatory Visit
Admission: RE | Admit: 2016-07-05 | Discharge: 2016-07-05 | Disposition: A | Discharge status: Home / Self Care | Payer: Medicaid Other | Source: Ambulatory Visit | Attending: Pain Medicine | Admitting: Pain Medicine

## 2016-07-05 ENCOUNTER — Ambulatory Visit (HOSPITAL_BASED_OUTPATIENT_CLINIC_OR_DEPARTMENT_OTHER): Payer: Medicaid Other | Admitting: Pain Medicine

## 2016-07-05 VITALS — BP 118/68 | HR 72 | Temp 96.8°F | Resp 16 | Ht 68.0 in | Wt 190.0 lb

## 2016-07-05 DIAGNOSIS — G8929 Other chronic pain: Secondary | ICD-10-CM | POA: Insufficient documentation

## 2016-07-05 DIAGNOSIS — M1288 Other specific arthropathies, not elsewhere classified, other specified site: Secondary | ICD-10-CM | POA: Diagnosis not present

## 2016-07-05 DIAGNOSIS — M545 Low back pain, unspecified: Secondary | ICD-10-CM

## 2016-07-05 DIAGNOSIS — M47816 Spondylosis without myelopathy or radiculopathy, lumbar region: Secondary | ICD-10-CM

## 2016-07-05 MED ORDER — LIDOCAINE HCL (PF) 1 % IJ SOLN: 10.0000 mL | Freq: Once | INTRAMUSCULAR | Status: DC

## 2016-07-05 MED ORDER — TRIAMCINOLONE ACETONIDE 40 MG/ML IJ SUSP: 40.0000 mg | Freq: Once | INTRAMUSCULAR | Status: DC

## 2016-07-05 MED ORDER — ROPIVACAINE HCL 2 MG/ML IJ SOLN
INTRAMUSCULAR | Status: AC
  Administered 2016-07-05: 09:00:00
  Filled 2016-07-05: qty 10

## 2016-07-05 MED ORDER — LACTATED RINGERS IV SOLN: 1000.0000 mL | Freq: Once | INTRAVENOUS | Status: DC

## 2016-07-05 MED ORDER — ROPIVACAINE HCL 2 MG/ML IJ SOLN: 9.0000 mL | Freq: Once | INTRAMUSCULAR | Status: DC

## 2016-07-05 MED ORDER — FENTANYL CITRATE (PF) 100 MCG/2ML IJ SOLN
INTRAMUSCULAR | Status: AC
  Administered 2016-07-05: 50 ug via INTRAVENOUS
  Filled 2016-07-05: qty 2

## 2016-07-05 MED ORDER — TRIAMCINOLONE ACETONIDE 40 MG/ML IJ SUSP
INTRAMUSCULAR | Status: AC
  Administered 2016-07-05: 09:00:00
  Filled 2016-07-05: qty 1

## 2016-07-05 MED ORDER — FENTANYL CITRATE (PF) 100 MCG/2ML IJ SOLN: 25.0000 ug | INTRAMUSCULAR | Status: DC | PRN

## 2016-07-05 MED ORDER — MIDAZOLAM HCL 5 MG/5ML IJ SOLN
INTRAMUSCULAR | Status: AC
  Administered 2016-07-05: 3 mg via INTRAVENOUS
  Filled 2016-07-05: qty 5

## 2016-07-05 MED ORDER — MIDAZOLAM HCL 5 MG/5ML IJ SOLN: 1.0000 mg | INTRAMUSCULAR | Status: DC | PRN

## 2016-07-05 NOTE — Progress Notes (Signed)
Safety precautions to be maintained throughout the outpatient stay will include: orient to surroundings, keep bed in low position, maintain call bell within reach at all times, provide assistance with transfer out of bed and ambulation.  

## 2016-07-05 NOTE — Patient Instructions (Signed)
Pain Management Discharge Instructions  General Discharge Instructions :  If you need to reach your doctor call: Monday-Friday 8:00 am - 4:00 pm at [phone] or toll free [phone].  After clinic hours [phone] to have operator reach doctor.  Bring all of your medication bottles to all your appointments in the pain clinic.  To cancel or reschedule your appointment with Pain Management please remember to call 24 hours in advance to avoid a fee.  Refer to the educational materials which you have been given on: General Risks, I had my Procedure. Discharge Instructions, Post Sedation.  Post Procedure Instructions:  The drugs you were given will stay in your system until tomorrow, so for the next 24 hours you should not drive, make any legal decisions or drink any alcoholic beverages.  You may eat anything you prefer, but it is better to start with liquids then soups and crackers, and gradually work up to solid foods.  Please notify your doctor immediately if you have any unusual bleeding, trouble breathing or pain that is not related to your normal pain.  Depending on the type of procedure that was done, some parts of your body may feel week and/or numb.  This usually clears up by tonight or the next day.  Walk with the use of an assistive device or accompanied by an adult for the 24 hours.  You may use ice on the affected area for the first 24 hours.  Put ice in a Ziploc bag and cover with a towel and place against area 15 minutes on 15 minutes off.  You may switch to heat after 24 hours.Pain Management Discharge Instructions  General Discharge Instructions :  If you need to reach your doctor call: Monday-Friday 8:00 am - 4:00 pm at [phone] or toll free [phone].  After clinic hours [phone] to have operator reach doctor.  Bring all of your medication bottles to all your appointments in the pain clinic.  To cancel or reschedule your appointment with Pain  Management please remember to call 24 hours in advance to avoid a fee.  Refer to the educational materials which you have been given on: General Risks, I had my Procedure. Discharge Instructions, Post Sedation.  Post Procedure Instructions:  The drugs you were given will stay in your system until tomorrow, so for the next 24 hours you should not drive, make any legal decisions or drink any alcoholic beverages.  You may eat anything you prefer, but it is better to start with liquids then soups and crackers, and gradually work up to solid foods.  Please notify your doctor immediately if you have any unusual bleeding, trouble breathing or pain that is not related to your normal pain.  Depending on the type of procedure that was done, some parts of your body may feel week and/or numb.  This usually clears up by tonight or the next day.  Walk with the use of an assistive device or accompanied by an adult for the 24 hours.  You may use ice on the affected area for the first 24 hours.  Put ice in a Ziploc bag and cover with a towel and place against area 15 minutes on 15 minutes off.  You may switch to heat after 24 hours.Facet Joint Block The facet joints connect the bones of the spine (vertebrae). They make it possible for you to bend, twist, and make other movements with your spine. They also prevent you from overbending, overtwisting, and making other excessive movements.  A  facet joint block is a procedure where a numbing medicine (anesthetic) is injected into a facet joint. Often, a type of anti-inflammatory medicine called a steroid is also injected. A facet joint block may be done for two reasons:   Diagnosis. A facet joint block may be done as a test to see whether neck or back pain is caused by a worn-down or infected facet joint. If the pain gets better after a facet joint block, it means the pain is probably coming from the facet joint. If the pain does not get better, it means the pain is  probably not coming from the facet joint.   Therapy. A facet joint block may be done to relieve neck or back pain caused by a facet joint. A facet joint block is only done as a therapy if the pain does not improve with medicine, exercise programs, physical therapy, and other forms of pain management. LET [facility] CARE PROVIDER KNOW ABOUT:   Any allergies you have.   All medicines you are taking, including vitamins, herbs, eyedrops, and over-the-counter medicines and creams.   Previous problems you or members of your family have had with the use of anesthetics.   Any blood disorders you have had.   Other health problems you have. RISKS AND COMPLICATIONS Generally, having a facet joint block is safe. However, as with any procedure, complications can occur. Possible complications associated with having a facet joint block include:   Bleeding.   Injury to a nerve near the injection site.   Pain at the injection site.   Weakness or numbness in areas controlled by nerves near the injection site.   Infection.   Temporary fluid retention.   Allergic reaction to anesthetics or medicines used during the procedure. BEFORE THE PROCEDURE   Follow your health care provider's instructions if you are taking dietary supplements or medicines. You may need to stop taking them or reduce your dosage.   Do not take any new dietary supplements or medicines without asking your health care provider first.   Follow your health care provider's instructions about eating and drinking before the procedure. You may need to stop eating and drinking several hours before the procedure.   Arrange to have an adult drive you home after the procedure. PROCEDURE  You may need to remove your clothing and dress in an open-back gown so that your health care provider can access your spine.   The procedure will be done while you are lying on an X-ray table. Most of the time you will be asked to lie  on your stomach, but you may be asked to lie in a different position if an injection will be made in your neck.   Special machines will be used to monitor your oxygen levels, heart rate, and blood pressure.   If an injection will be made in your neck, an intravenous (IV) tube will be inserted into one of your veins. Fluids and medicine will flow directly into your body through the IV tube.   The area over the facet joint where the injection will be made will be cleaned with an antiseptic soap. The surrounding skin will be covered with sterile drapes.   An anesthetic will be applied to your skin to make the injection area numb. You may feel a temporary stinging or burning sensation.   A video X-ray machine will be used to locate the joint. A contrast dye may be injected into the facet joint area to help with  locating the joint.   When the joint is located, an anesthetic medicine will be injected into the joint through the needle.   Your health care provider will ask you whether you feel pain relief. If you do feel relief, a steroid may be injected to provide pain relief for a longer period of time. If you do not feel relief or feel only partial relief, additional injections of an anesthetic may be made in other facet joints.   The needle will be removed, the skin will be cleansed, and bandages will be applied.  AFTER THE PROCEDURE   You will be observed for 15-30 minutes before being allowed to go home. Do not drive. Have an adult drive you or take a taxi or public transportation instead.   If you feel pain relief, the pain will return in several hours or days when the anesthetic wears off.   You may feel pain relief 2-14 days after the procedure. The amount of time this relief lasts varies from person to person.   It is normal to feel some tenderness over the injected area(s) for 2 days following the procedure.   If you have diabetes, you may have a temporary increase in blood  sugar. This information is not intended to replace advice given to you by your health care provider. Make sure you discuss any questions you have with your health care provider. Document Released: 11/24/2006 Document Revised: 07/26/2014 Document Reviewed: 03/31/2015 Elsevier Interactive Patient Education  2017 [employer] Facet Joint Block, Care After Refer to this sheet in the next few weeks. These instructions provide you with information on caring for yourself after your procedure. Your health care provider may also give you more specific instructions. Your treatment has been planned according to current medical practices, but problems sometimes occur. Call your health care provider if you have any problems or questions after your procedure. HOME CARE INSTRUCTIONS   Keep track of the amount of pain relief you feel and how long it lasts.  Limit pain medicine within the first 4-6 hours after the procedure as directed by your health care provider.  Resume taking dietary supplements and medicines as directed by your health care provider.  You may resume your regular diet.  Do not apply heat near or over the injection site(s) for 24 hours.   Do not take a bath or soak in water (such as a pool or lake) for 24 hours.  Do not drive for 24 hours unless approved by your health care provider.  Avoid strenuous activity for 24 hours.  Remove your bandages the morning after the procedure.   If the injection site is tender, applying an ice pack may relieve some tenderness. To do this:  Put ice in a bag.  Place a towel between your skin and the bag.  Leave the ice on for 15-20 minutes, 3-4 times a day.  Keep follow-up appointments as directed by your health care provider. SEEK MEDICAL CARE IF:   Your pain is not controlled by your medicines.   There is drainage from the injection site.   There is significant bleeding or swelling at the injection site.  You have diabetes and  your blood sugar is above 180 mg/dL. SEEK IMMEDIATE MEDICAL CARE IF:   You develop a fever of 101F (38.3C) or greater.   You have worsening pain or swelling around the injection site.   You have red streaking around the injection site.   You develop severe pain that is  not controlled by your medicines.   You develop a headache, stiff neck, nausea, or vomiting.   Your eyes become very sensitive to light.   You have weakness, paralysis, or tingling in your arms or legs that was not present before the procedure.   You develop difficulty urinating or breathing.  This information is not intended to replace advice given to you by your health care provider. Make sure you discuss any questions you have with your health care provider. Document Released: 06/21/2012 Document Revised: 07/26/2014 Document Reviewed: 03/31/2015 Elsevier Interactive Patient Education  2017 [employer].

## 2016-07-05 NOTE — Progress Notes (Signed)
Patient's Name: Erica [name]  MRN: [id number]  Referring Provider: Delano MetzNaveira, Adelaine Roppolo, MD  DOB: October 05, 1965  PCP: [facility]  DOS: 07/05/2016  Note by: Sydnee LevansFrancisco A. Laban EmperorNaveira, MD  Service setting: Ambulatory outpatient  Location: [facility] (AMB) Pain Management Facility  Visit type: Procedure  Specialty: Interventional Pain Management  Patient type: Established   Primary Reason for Visit: Interventional Pain Management Treatment. CC: Back Pain (left)  Procedure:  Anesthesia, Analgesia, Anxiolysis:  Type: Diagnostic Medial Branch Facet Block Region: Lumbar Level: L2, L3, L4, L5, & S1 Medial Branch Level(s) Laterality: Bilateral  Type: Local Anesthesia with Moderate (Conscious) Sedation Local Anesthetic: Lidocaine 1% Route: Intravenous (IV) IV Access: Secured Sedation: Meaningful verbal contact was maintained at all times during the procedure  Indication(s): Analgesia and Anxiety  Indications: 1. Lumbar facet syndrome (Bilateral) (R>L)   2. Chronic low back pain (Location of Primary Source of Pain) (Bilateral) (L>R)   3. Lumbar spondylosis    Pain Score: Pre-procedure: 2 /10 Post-procedure: 0-No pain/10  Pre-Procedure Assessment:  [name] is a 50 y.o. (year old), female patient, seen today for interventional treatment. She  has a past surgical history that includes Knee surgery (Left) and Flexible bronchoscopy (N/A, 06/20/2015).. Her primarily concern today is the Back Pain (left) The primary encounter diagnosis was Lumbar facet syndrome (Bilateral) (R>L). Diagnoses of Chronic low back pain (Location of Primary Source of Pain) (Bilateral) (L>R) and Lumbar spondylosis were also pertinent to this visit.  Pain Type: Chronic pain Pain Location: Back Pain Orientation: Left Pain Descriptors / Indicators: Aching, Constant, Radiating, Sharp, Numbness Pain Frequency: Constant  Date of Last Visit: 06/29/16 Service Provided on Last Visit: Med Refill  Coagulation  Parameters Lab Results  Component Value Date   PLT 249 06/14/2016   Verification of the correct person, correct site (including marking of site), and correct procedure were performed and confirmed by the patient.  Consent: Before the procedure and under the influence of no sedative(s), amnesic(s), or anxiolytics, the patient was informed of the treatment options, risks and possible complications. To fulfill our ethical and legal obligations, as recommended by the American Medical Association's Code of Ethics, I have informed the patient of my clinical impression; the nature and purpose of the treatment or procedure; the risks, benefits, and possible complications of the intervention; the alternatives, including doing nothing; the risk(s) and benefit(s) of the alternative treatment(s) or procedure(s); and the risk(s) and benefit(s) of doing nothing. The patient was provided information about the general risks and possible complications associated with the procedure. These may include, but are not limited to: failure to achieve desired goals, infection, bleeding, organ or nerve damage, allergic reactions, paralysis, and death. In addition, the patient was informed of those risks and complications associated to Spine-related procedures, such as failure to decrease pain; infection (i.e.: Meningitis, epidural or intraspinal abscess); bleeding (i.e.: epidural hematoma, subarachnoid hemorrhage, or any other type of intraspinal or peri-dural bleeding); organ or nerve damage (i.e.: Any type of peripheral nerve, nerve root, or spinal cord injury) with subsequent damage to sensory, motor, and/or autonomic systems, resulting in permanent pain, numbness, and/or weakness of one or several areas of the body; allergic reactions; (i.e.: anaphylactic reaction); and/or death. Furthermore, the patient was informed of those risks and complications associated with the medications. These include, but are not limited to:  allergic reactions (i.e.: anaphylactic or anaphylactoid reaction(s)); adrenal axis suppression; blood sugar elevation that in diabetics may result in ketoacidosis or comma; water retention that in patients with  history of congestive heart failure may result in shortness of breath, pulmonary edema, and decompensation with resultant heart failure; weight gain; swelling or edema; medication-induced neural toxicity; particulate matter embolism and blood vessel occlusion with resultant organ, and/or nervous system infarction; and/or aseptic necrosis of one or more joints. Finally, the patient was informed that Medicine is not an exact science; therefore, there is also the possibility of unforeseen or unpredictable risks and/or possible complications that may result in a catastrophic outcome. The patient indicated having understood very clearly. We have given the patient no guarantees and we have made no promises. Enough time was given to the patient to ask questions, all of which were answered to the patient's satisfaction. Ms. Linam has indicated that she wanted to continue with the procedure.  Consent Attestation: I, the ordering provider, attest that I have discussed with the patient the benefits, risks, side-effects, alternatives, likelihood of achieving goals, and potential problems during recovery for the procedure that I have provided informed consent.  Pre-Procedure Preparation:  Safety Precautions: Allergies reviewed. The patient was asked about blood thinners, or active infections, both of which were denied. The patient was asked to confirm the procedure and laterality, before marking the site, and again before commencing the procedure. Appropriate site, procedure, and patient were confirmed by following the Joint Commission's Universal Protocol (UP.01.01.01), in the form of a "Time Out". The patient was asked to participate by confirming the accuracy of the "Time Out" information. Patient was assessed  for positional comfort and pressure points before starting the procedure. Allergies: She is allergic to tramadol and penicillins. Allergy Precautions: None required Infection Control Precautions: Sterile technique used. Standard Universal Precautions were taken as recommended by the Department of [facility] for Disease Control and Prevention (CDC). Standard pre-surgical skin prep was conducted. Respiratory hygiene and cough etiquette was practiced. Hand hygiene observed. Safe injection practices and needle disposal techniques followed. SDV (single dose vial) medications used. Medications properly checked for expiration dates and contaminants. Personal protective equipment (PPE) used as per protocol. Monitoring:  As per clinic protocol. Vitals:   07/05/16 0920 07/05/16 0930 07/05/16 0940 07/05/16 0950  BP: 105/60 94/77 107/67 118/68  Pulse:   76 72  Resp: 20 16 17 16   Temp:  (!) 96.8 F (36 C)    TempSrc:      SpO2: 95% 95% 96% 96%  Weight:      Height:      Calculated BMI: Body mass index is 28.89 kg/m. Time-out: "Time-out" completed before starting procedure, as per protocol.  Imaging Review  Lumbosacral Imaging: Lumbar MR wo contrast:  Results for orders placed in visit on 01/02/09  MR L Spine Ltd W/O Cm   Narrative * PRIOR REPORT IMPORTED FROM AN EXTERNAL SYSTEM *   PRIOR REPORT IMPORTED FROM THE [employer] WORKFLOW SYSTEM   REASON FOR EXAM:    low back pain  pre cert [id number]  COMMENTS:   PROCEDURE:     MR  - MR LUMBAR SPINE WO CONTRAST  - Jan 02 2009  5:10PM   RESULT:     MRI LUMBAR SPINE WITHOUT CONTRAST   HISTORY: Low back pain   COMPARISON: No comparison   TECHNIQUE: Multiplanar and multisequence MRI of the lumbar spine were  obtained, without administration of IV contrast.   FINDINGS:   There is mild anterior vertebral body height loss of the L1 vertebral body  without focal signal upper abnormality, most consistent with a chronic  compression fracture.  There is  normal bone marrow signal demonstrated  throughout the vertebra. There is disc desiccation most significant at  L4-L5  and L5-S1 with disc height loss at L5-S1.   The spinal cord is of normal volume and contour. The cord terminates  normally at L1 . The nerve roots of the cauda equina and the filum  terminale  have the usual appearance.   The visualized portions of the SI joints are unremarkable.   The imaged intra-abdominal contents are unremarkable.   T12-L1: No significant disc bulge. No evidence of neural foraminal or  central stenosis.   L1-L2: Mild right paracentral disc bulge. No evidence of neural foraminal  or  central stenosis.   L2-L3: Mild broad-based disc bulge. No evidence of neural foraminal or  central stenosis.   L3-L4: No significant disc bulge. No evidence of neural foraminal or  central  stenosis.   L4-L5: Mild left paracentral disc bulge with mild impression on the  ventral  thecal sac. No evidence of neural foraminal or central stenosis.   L5-S1: Moderate sized central disc protrusion with inferior migration of  disc material without significant impression on the ventral thecal sac. No  evidence of neural foraminal or central stenosis.   IMPRESSION:   1. Lumbar spine spondylosis as described above.       Lumbar DG 2-3 views:  Results for orders placed during the hospital encounter of 11/25/15  DG Lumbar Spine 2-3 Views   Narrative CLINICAL DATA:  Back pain.  EXAM: LUMBAR SPINE - 2-3 VIEW  COMPARISON:  Lumbar spine MRI 01/02/2009  FINDINGS: Degenerative disc narrowing and endplate spurring greatest at L1-2, L3-4, and L5-S1. Disc narrowing has mildly and diffusely progressed since 2010. Borderline retrolisthesis at L2-3 and L3-4. No fracture or endplate erosion. Notable for age atherosclerotic calcification.  IMPRESSION: Degenerative disc disease greatest at L1-2, L3-4, and L5-S1.   Electronically Signed   By: Marnee SpringJonathon  Watts  M.D.   On: 11/25/2015 11:01    Lumbar DG Bending views:  Results for orders placed during the hospital encounter of 04/30/16  DG Lumbar Spine Complete W/Bend   Narrative CLINICAL DATA:  Lumbago, progressive  EXAM: LUMBAR SPINE - COMPLETE WITH BENDING VIEWS  COMPARISON:  Nov 25, 2015  FINDINGS: Frontal, standing neutral lateral, standing flexion lateral, standing extension lateral, and bilateral oblique views were obtained. There are 5 non-rib-bearing lumbar type vertebral bodies. There is no appreciable fracture. There is 2 mm of retrolisthesis of L2 on L3 with neutral and extension lateral imaging. There is no spondylolisthesis at this site with flexion. No spondylolisthesis is seen elsewhere. There is moderately severe disc space narrowing at L5-S1. There is moderate disc space narrowing at L1-2 and slightly milder disc space narrowing at L2-3. There are anterior osteophytes at all levels. There is facet osteoarthritic change at L5-S1 bilaterally. There is calcification in the aorta and iliac arteries.  IMPRESSION: No fracture. With neutral lateral and extension lateral imaging, there is 2 mm of retrolisthesis of L2 on L3. There is no spondylolisthesis at L2-3 with flexion. No spondylolisthesis seen elsewhere. Osteoarthritic changes noted, most marked at L5-S1. There is aortoiliac atherosclerosis.   Electronically Signed   By: Bretta BangWilliam  Woodruff III M.D.   On: 04/30/2016 14:33    Description of Procedure Process:   Time-out: "Time-out" completed before starting procedure, as per protocol. Position: Prone Target Area: For Lumbar Facet blocks, the target is the groove formed by the junction of the transverse process and superior articular process. For the L5 dorsal  ramus, the target is the notch between superior articular process and sacral ala. For the S1 dorsal ramus, the target is the superior and lateral edge of the posterior S1 Sacral foramen. Approach: Paramedial  approach. Area Prepped: Entire Posterior Lumbosacral Region Prepping solution: ChloraPrep (2% chlorhexidine gluconate and 70% isopropyl alcohol) Safety Precautions: Aspiration looking for blood return was conducted prior to all injections. At no point did we inject any substances, as a needle was being advanced. No attempts were made at seeking any paresthesias. Safe injection practices and needle disposal techniques used. Medications properly checked for expiration dates. SDV (single dose vial) medications used. Description of the Procedure: Protocol guidelines were followed. The patient was placed in position over the fluoroscopy table. The target area was identified and the area prepped in the usual manner. Skin desensitized using vapocoolant spray. Skin & deeper tissues infiltrated with local anesthetic. Appropriate amount of time allowed to pass for local anesthetics to take effect. The procedure needle was introduced through the skin, ipsilateral to the reported pain, and advanced to the target area. Employing the "Medial Branch Technique", the needles were advanced to the angle made by the superior and medial portion of the transverse process, and the lateral and inferior portion of the superior articulating process of the targeted vertebral bodies. This area is known as "Burton's Eye" or the "Eye of the [place] Dog". A procedure needle was introduced through the skin, and this time advanced to the angle made by the superior and medial border of the sacral ala, and the lateral border of the S1 vertebral body. This last needle was later repositioned at the superior and lateral border of the posterior S1 foramen. Negative aspiration confirmed. Solution injected in intermittent fashion, asking for systemic symptoms every 0.5cc of injectate. The needles were then removed and the area cleansed, making sure to leave some of the prepping solution back to take advantage of its long term bactericidal  properties. EBL: None Materials & Medications Used:  Needle(s) Used: 22g - 3.5" Spinal Needle(s)   Illustration of the posterior view of the lumbar spine and the posterior neural structures. Laminae of L2 through S1 are labeled. DPRL5, dorsal primary ramus of L5; DPRS1, dorsal primary ramus of S1; DPR3, dorsal primary ramus of L3; FJ, facet (zygapophyseal) joint L3-L4; I, inferior articular process of L4; LB1, lateral branch of dorsal primary ramus of L1; IAB, inferior articular branches from L3 medial branch (supplies L4-L5 facet joint); IBP, intermediate branch plexus; MB3, medial branch of dorsal primary ramus of L3; NR3, third lumbar nerve root; S, superior articular process of L5; SAB, superior articular branches from L4 (supplies L4-5 facet joint also); TP3, transverse process of L3.  Imaging Guidance (Spinal):  Type of Imaging Technique: Fluoroscopy Guidance (Spinal) Indication(s): Assistance in needle guidance and placement for procedures requiring needle placement in or near specific anatomical locations not easily accessible without such assistance. Exposure Time: Please see nurses notes. Contrast: None used. Fluoroscopic Guidance: I was personally present during the use of fluoroscopy. "Tunnel Vision Technique" used to obtain the best possible view of the target area. Parallax error corrected before commencing the procedure. "Direction-depth-direction" technique used to introduce the needle under continuous pulsed fluoroscopy. Once target was reached, antero-posterior, oblique, and lateral fluoroscopic projection used confirm needle placement in all planes. Images permanently stored in EMR. Interpretation: No contrast injected. I personally interpreted the imaging intraoperatively. Adequate needle placement confirmed in multiple planes. Permanent images saved into the patient's record.  Antibiotic Prophylaxis:  Indication(s): No  indications identified. Type:  Antibiotics Given (last 72  hours)    None      Post-operative Assessment:  Complications: No immediate post-treatment complications observed by team, or reported by patient. Disposition: The patient tolerated the entire procedure well. A repeat set of vitals were taken after the procedure and the patient was kept under observation following institutional policy, for this type of procedure. Post-procedural neurological assessment was performed, showing return to baseline, prior to discharge. The patient was provided with post-procedure discharge instructions, including a section on how to identify potential problems. Should any problems arise concerning this procedure, the patient was given instructions to immediately contact us, at any time, without hesitation. In any case, we plan to contact the patient by telephone for a follow-up status report regarding this interventional procedure. Comments:  No additional relevant information.  Plan of Care  Discharge to: Discharge home  Medications ordered for procedure: Meds ordered this encounter  Medications  . fentaNYL (SUBLIMAZE) injection 25-50 mcg    Make sure Narcan is available in the pyxis when using this medication. In the event of respiratory depression (RR< 8/min): Titrate NARCAN (naloxone) in increments of 0.1 to 0.2 mg IV at 2-3 minute intervals, until desired degree of reversal.  . lactated ringers infusion 1,000 mL  . midazolam (VERSED) 5 MG/5ML injection 1-2 mg    Make sure Flumazenil is available in the pyxis when using this medication. If oversedation occurs, administer 0.2 mg IV over 15 sec. If after 45 sec no response, administer 0.2 mg again over 1 min; may repeat at 1 min intervals; not to exceed 4 doses (1 mg)  . triamcinolone acetonide (KENALOG-40) injection 40 mg  . lidocaine (PF) (XYLOCAINE) 1 % injection 10 mL  . ropivacaine (PF) 2 mg/mL (0.2%) (NAROPIN) injection 9 mL  . ropivacaine (PF) 2 mg/mL (0.2%) (NAROPIN) injection 9 mL  . triamcinolone  acetonide (KENALOG-40) injection 40 mg  . ropivacaine (PF) 2 mg/mL (0.2%) (NAROPIN) 2 MG/ML injection    Roney Mans L: cabinet override  . fentaNYL (SUBLIMAZE) 100 MCG/2ML injection    Roney Mans L: cabinet override  . triamcinolone acetonide (KENALOG-40) 40 MG/ML injection    Roney Mans L: cabinet override  . midazolam (VERSED) 5 MG/5ML injection    Roney Mans L: cabinet override  . ropivacaine (PF) 2 mg/mL (0.2%) (NAROPIN) 2 MG/ML injection    Roney Mans L: cabinet override  . triamcinolone acetonide (KENALOG-40) 40 MG/ML injection    Roney Mans L: cabinet override   Medications administered: (For more details, see medical record) We administered ropivacaine (PF) 2 mg/mL (0.2%), fentaNYL, triamcinolone acetonide, midazolam, ropivacaine (PF) 2 mg/mL (0.2%), and triamcinolone acetonide. Lab-work, Procedure(s), & Referral(s) Ordered: Orders Placed This Encounter  Procedures  . DG C-Arm 1-60 Min-No Report   Imaging Ordered: No results found for this or any previous visit. New Prescriptions   No medications on file   Physician-requested Follow-up:  Return for previously scheduled appointment, Post-Procedure evaluation, Med-Mgmt.  Future Appointments Date Time Provider Department Center  07/28/2016 1:45 PM Delano Metz, MD [facility] None  08/31/2016 1:00 PM Delma Freeze, FNP [facility] None   Primary Care Physician: Phineas Real [facility] Location: [facility] Note by: Sydnee Levans. Laban Emperor, M.D, DABA, DABAPM, DABPM, DABIPP, FIPP Date: 07/05/16; Time: 1:29 PM  Disclaimer:  Medicine is not an Visual merchandiser. The only guarantee in medicine is that nothing is guaranteed. It is important to note that the decision to proceed with this  intervention was based on the information collected from the patient. The Data and conclusions were drawn from the patient's questionnaire, the interview, and the physical  examination. Because the information was provided in large part by the patient, it cannot be guaranteed that it has not been purposely or unconsciously manipulated. Every effort has been made to obtain as much relevant data as possible for this evaluation. It is important to note that the conclusions that lead to this procedure are derived in large part from the available data. Always take into account that the treatment will also be dependent on availability of resources and existing treatment guidelines, considered by other Pain Management Practitioners as being common knowledge and practice, at the time of the intervention. For Medico-Legal purposes, it is also important to point out that variation in procedural techniques and pharmacological choices are the acceptable norm. The indications, contraindications, technique, and results of the above procedure should only be interpreted and judged by a Board-Certified Interventional Pain Specialist with extensive familiarity and expertise in the same exact procedure and technique. Attempts at providing opinions without similar or greater experience and expertise than that of the treating physician will be considered as inappropriate and unethical, and shall result in a formal complaint to the state medical board and applicable specialty societies.  Instructions provided at this appointment: Patient Instructions  Pain Management Discharge Instructions  General Discharge Instructions :  If you need to reach your doctor call: Monday-Friday 8:00 am - 4:00 pm at [phone] or toll free [phone].  After clinic hours [phone] to have operator reach doctor.  Bring all of your medication bottles to all your appointments in the pain clinic.  To cancel or reschedule your appointment with Pain Management please remember to call 24 hours in advance to avoid a fee.  Refer to the educational materials which you have been given on: General Risks, I had my  Procedure. Discharge Instructions, Post Sedation.  Post Procedure Instructions:  The drugs you were given will stay in your system until tomorrow, so for the next 24 hours you should not drive, make any legal decisions or drink any alcoholic beverages.  You may eat anything you prefer, but it is better to start with liquids then soups and crackers, and gradually work up to solid foods.  Please notify your doctor immediately if you have any unusual bleeding, trouble breathing or pain that is not related to your normal pain.  Depending on the type of procedure that was done, some parts of your body may feel week and/or numb.  This usually clears up by tonight or the next day.  Walk with the use of an assistive device or accompanied by an adult for the 24 hours.  You may use ice on the affected area for the first 24 hours.  Put ice in a Ziploc bag and cover with a towel and place against area 15 minutes on 15 minutes off.  You may switch to heat after 24 hours.Pain Management Discharge Instructions  General Discharge Instructions :  If you need to reach your doctor call: Monday-Friday 8:00 am - 4:00 pm at [phone] or toll free [phone].  After clinic hours [phone] to have operator reach doctor.  Bring all of your medication bottles to all your appointments in the pain clinic.  To cancel or reschedule your appointment with Pain Management please remember to call 24 hours in advance to avoid a fee.  Refer to the educational materials which you have been given on: General  Risks, I had my Procedure. Discharge Instructions, Post Sedation.  Post Procedure Instructions:  The drugs you were given will stay in your system until tomorrow, so for the next 24 hours you should not drive, make any legal decisions or drink any alcoholic beverages.  You may eat anything you prefer, but it is better to start with liquids then soups and crackers, and gradually work up to solid  foods.  Please notify your doctor immediately if you have any unusual bleeding, trouble breathing or pain that is not related to your normal pain.  Depending on the type of procedure that was done, some parts of your body may feel week and/or numb.  This usually clears up by tonight or the next day.  Walk with the use of an assistive device or accompanied by an adult for the 24 hours.  You may use ice on the affected area for the first 24 hours.  Put ice in a Ziploc bag and cover with a towel and place against area 15 minutes on 15 minutes off.  You may switch to heat after 24 hours.Facet Joint Block The facet joints connect the bones of the spine (vertebrae). They make it possible for you to bend, twist, and make other movements with your spine. They also prevent you from overbending, overtwisting, and making other excessive movements.  A facet joint block is a procedure where a numbing medicine (anesthetic) is injected into a facet joint. Often, a type of anti-inflammatory medicine called a steroid is also injected. A facet joint block may be done for two reasons:   Diagnosis. A facet joint block may be done as a test to see whether neck or back pain is caused by a worn-down or infected facet joint. If the pain gets better after a facet joint block, it means the pain is probably coming from the facet joint. If the pain does not get better, it means the pain is probably not coming from the facet joint.   Therapy. A facet joint block may be done to relieve neck or back pain caused by a facet joint. A facet joint block is only done as a therapy if the pain does not improve with medicine, exercise programs, physical therapy, and other forms of pain management. LET [facility] CARE PROVIDER KNOW ABOUT:   Any allergies you have.   All medicines you are taking, including vitamins, herbs, eyedrops, and over-the-counter medicines and creams.   Previous problems you or members of your family have had  with the use of anesthetics.   Any blood disorders you have had.   Other health problems you have. RISKS AND COMPLICATIONS Generally, having a facet joint block is safe. However, as with any procedure, complications can occur. Possible complications associated with having a facet joint block include:   Bleeding.   Injury to a nerve near the injection site.   Pain at the injection site.   Weakness or numbness in areas controlled by nerves near the injection site.   Infection.   Temporary fluid retention.   Allergic reaction to anesthetics or medicines used during the procedure. BEFORE THE PROCEDURE   Follow your health care provider's instructions if you are taking dietary supplements or medicines. You may need to stop taking them or reduce your dosage.   Do not take any new dietary supplements or medicines without asking your health care provider first.   Follow your health care provider's instructions about eating and drinking before the procedure. You may need  to stop eating and drinking several hours before the procedure.   Arrange to have an adult drive you home after the procedure. PROCEDURE  You may need to remove your clothing and dress in an open-back gown so that your health care provider can access your spine.   The procedure will be done while you are lying on an X-ray table. Most of the time you will be asked to lie on your stomach, but you may be asked to lie in a different position if an injection will be made in your neck.   Special machines will be used to monitor your oxygen levels, heart rate, and blood pressure.   If an injection will be made in your neck, an intravenous (IV) tube will be inserted into one of your veins. Fluids and medicine will flow directly into your body through the IV tube.   The area over the facet joint where the injection will be made will be cleaned with an antiseptic soap. The surrounding skin will be covered with  sterile drapes.   An anesthetic will be applied to your skin to make the injection area numb. You may feel a temporary stinging or burning sensation.   A video X-ray machine will be used to locate the joint. A contrast dye may be injected into the facet joint area to help with locating the joint.   When the joint is located, an anesthetic medicine will be injected into the joint through the needle.   Your health care provider will ask you whether you feel pain relief. If you do feel relief, a steroid may be injected to provide pain relief for a longer period of time. If you do not feel relief or feel only partial relief, additional injections of an anesthetic may be made in other facet joints.   The needle will be removed, the skin will be cleansed, and bandages will be applied.  AFTER THE PROCEDURE   You will be observed for 15-30 minutes before being allowed to go home. Do not drive. Have an adult drive you or take a taxi or public transportation instead.   If you feel pain relief, the pain will return in several hours or days when the anesthetic wears off.   You may feel pain relief 2-14 days after the procedure. The amount of time this relief lasts varies from person to person.   It is normal to feel some tenderness over the injected area(s) for 2 days following the procedure.   If you have diabetes, you may have a temporary increase in blood sugar. This information is not intended to replace advice given to you by your health care provider. Make sure you discuss any questions you have with your health care provider. Document Released: 11/24/2006 Document Revised: 07/26/2014 Document Reviewed: 03/31/2015 Elsevier Interactive Patient Education  2017 [employer] Facet Joint Block, Care After Refer to this sheet in the next few weeks. These instructions provide you with information on caring for yourself after your procedure. Your health care provider may also give you more  specific instructions. Your treatment has been planned according to current medical practices, but problems sometimes occur. Call your health care provider if you have any problems or questions after your procedure. HOME CARE INSTRUCTIONS   Keep track of the amount of pain relief you feel and how long it lasts.  Limit pain medicine within the first 4-6 hours after the procedure as directed by your health care provider.  Resume taking dietary supplements  and medicines as directed by your health care provider.  You may resume your regular diet.  Do not apply heat near or over the injection site(s) for 24 hours.   Do not take a bath or soak in water (such as a pool or lake) for 24 hours.  Do not drive for 24 hours unless approved by your health care provider.  Avoid strenuous activity for 24 hours.  Remove your bandages the morning after the procedure.   If the injection site is tender, applying an ice pack may relieve some tenderness. To do this:  Put ice in a bag.  Place a towel between your skin and the bag.  Leave the ice on for 15-20 minutes, 3-4 times a day.  Keep follow-up appointments as directed by your health care provider. SEEK MEDICAL CARE IF:   Your pain is not controlled by your medicines.   There is drainage from the injection site.   There is significant bleeding or swelling at the injection site.  You have diabetes and your blood sugar is above 180 mg/dL. SEEK IMMEDIATE MEDICAL CARE IF:   You develop a fever of 101F (38.3C) or greater.   You have worsening pain or swelling around the injection site.   You have red streaking around the injection site.   You develop severe pain that is not controlled by your medicines.   You develop a headache, stiff neck, nausea, or vomiting.   Your eyes become very sensitive to light.   You have weakness, paralysis, or tingling in your arms or legs that was not present before the procedure.   You  develop difficulty urinating or breathing.  This information is not intended to replace advice given to you by your health care provider. Make sure you discuss any questions you have with your health care provider. Document Released: 06/21/2012 Document Revised: 07/26/2014 Document Reviewed: 03/31/2015 Elsevier Interactive Patient Education  2017 [employer].

## 2016-07-06 ENCOUNTER — Telehealth: Payer: Self-pay | Admitting: *Deleted

## 2016-07-06 NOTE — Telephone Encounter (Signed)
Left voicemail for patient to return call for any post procedure concerns.

## 2016-07-09 MED ORDER — ONDANSETRON HCL 4 MG/2ML IJ SOLN
INTRAMUSCULAR | Status: AC
  Filled 2016-07-09: qty 2

## 2016-07-09 MED ORDER — NITROGLYCERIN 0.4 MG SL SUBL
SUBLINGUAL_TABLET | SUBLINGUAL | Status: AC
  Filled 2016-07-09: qty 1

## 2016-07-28 ENCOUNTER — Ambulatory Visit: Payer: Medicaid Other | Attending: Pain Medicine | Admitting: Pain Medicine

## 2016-07-28 ENCOUNTER — Encounter: Payer: Self-pay | Admitting: Pain Medicine

## 2016-07-28 VITALS — BP 134/96 | HR 90 | Temp 98.3°F | Resp 16 | Ht 68.0 in | Wt 194.0 lb

## 2016-07-28 DIAGNOSIS — M1288 Other specific arthropathies, not elsewhere classified, other specified site: Secondary | ICD-10-CM

## 2016-07-28 DIAGNOSIS — M25559 Pain in unspecified hip: Secondary | ICD-10-CM | POA: Diagnosis not present

## 2016-07-28 DIAGNOSIS — Z7982 Long term (current) use of aspirin: Secondary | ICD-10-CM | POA: Insufficient documentation

## 2016-07-28 DIAGNOSIS — M25561 Pain in right knee: Secondary | ICD-10-CM

## 2016-07-28 DIAGNOSIS — M25562 Pain in left knee: Secondary | ICD-10-CM | POA: Diagnosis not present

## 2016-07-28 DIAGNOSIS — F1190 Opioid use, unspecified, uncomplicated: Secondary | ICD-10-CM

## 2016-07-28 DIAGNOSIS — M47816 Spondylosis without myelopathy or radiculopathy, lumbar region: Secondary | ICD-10-CM

## 2016-07-28 DIAGNOSIS — Z79891 Long term (current) use of opiate analgesic: Secondary | ICD-10-CM | POA: Insufficient documentation

## 2016-07-28 DIAGNOSIS — G894 Chronic pain syndrome: Secondary | ICD-10-CM | POA: Diagnosis not present

## 2016-07-28 DIAGNOSIS — M545 Low back pain: Secondary | ICD-10-CM | POA: Diagnosis not present

## 2016-07-28 DIAGNOSIS — Z5181 Encounter for therapeutic drug level monitoring: Secondary | ICD-10-CM | POA: Diagnosis not present

## 2016-07-28 DIAGNOSIS — G8929 Other chronic pain: Secondary | ICD-10-CM

## 2016-07-28 DIAGNOSIS — Z79899 Other long term (current) drug therapy: Secondary | ICD-10-CM | POA: Insufficient documentation

## 2016-07-28 DIAGNOSIS — M25551 Pain in right hip: Secondary | ICD-10-CM | POA: Diagnosis not present

## 2016-07-28 DIAGNOSIS — M25552 Pain in left hip: Secondary | ICD-10-CM | POA: Insufficient documentation

## 2016-07-28 MED ORDER — HYDROCODONE-ACETAMINOPHEN 5-325 MG PO TABS: 1.0000 | ORAL_TABLET | Freq: Four times a day (QID) | ORAL | 0 refills | Status: DC | PRN

## 2016-07-28 NOTE — Patient Instructions (Signed)
Facet Joint Block The facet joints connect the bones of the spine (vertebrae). They make it possible for you to bend, twist, and make other movements with your spine. They also prevent you from overbending, overtwisting, and making other excessive movements.  A facet joint block is a procedure where a numbing medicine (anesthetic) is injected into a facet joint. Often, a type of anti-inflammatory medicine called a steroid is also injected. A facet joint block may be done for two reasons:   Diagnosis. A facet joint block may be done as a test to see whether neck or back pain is caused by a worn-down or infected facet joint. If the pain gets better after a facet joint block, it means the pain is probably coming from the facet joint. If the pain does not get better, it means the pain is probably not coming from the facet joint.   Therapy. A facet joint block may be done to relieve neck or back pain caused by a facet joint. A facet joint block is only done as a therapy if the pain does not improve with medicine, exercise programs, physical therapy, and other forms of pain management. LET YOUR HEALTH CARE PROVIDER KNOW ABOUT:   Any allergies you have.   All medicines you are taking, including vitamins, herbs, eyedrops, and over-the-counter medicines and creams.   Previous problems you or members of your family have had with the use of anesthetics.   Any blood disorders you have had.   Other health problems you have. RISKS AND COMPLICATIONS Generally, having a facet joint block is safe. However, as with any procedure, complications can occur. Possible complications associated with having a facet joint block include:   Bleeding.   Injury to a nerve near the injection site.   Pain at the injection site.   Weakness or numbness in areas controlled by nerves near the injection site.   Infection.   Temporary fluid retention.   Allergic reaction to anesthetics or medicines used during  the procedure. BEFORE THE PROCEDURE   Follow your health care provider's instructions if you are taking dietary supplements or medicines. You may need to stop taking them or reduce your dosage.   Do not take any new dietary supplements or medicines without asking your health care provider first.   Follow your health care provider's instructions about eating and drinking before the procedure. You may need to stop eating and drinking several hours before the procedure.   Arrange to have an adult drive you home after the procedure. PROCEDURE  You may need to remove your clothing and dress in an open-back gown so that your health care provider can access your spine.   The procedure will be done while you are lying on an X-ray table. Most of the time you will be asked to lie on your stomach, but you may be asked to lie in a different position if an injection will be made in your neck.   Special machines will be used to monitor your oxygen levels, heart rate, and blood pressure.   If an injection will be made in your neck, an intravenous (IV) tube will be inserted into one of your veins. Fluids and medicine will flow directly into your body through the IV tube.   The area over the facet joint where the injection will be made will be cleaned with an antiseptic soap. The surrounding skin will be covered with sterile drapes.   An anesthetic will be applied to your skin   to make the injection area numb. You may feel a temporary stinging or burning sensation.   A video X-ray machine will be used to locate the joint. A contrast dye may be injected into the facet joint area to help with locating the joint.   When the joint is located, an anesthetic medicine will be injected into the joint through the needle.   Your health care provider will ask you whether you feel pain relief. If you do feel relief, a steroid may be injected to provide pain relief for a longer period of time. If you do not  feel relief or feel only partial relief, additional injections of an anesthetic may be made in other facet joints.   The needle will be removed, the skin will be cleansed, and bandages will be applied.  AFTER THE PROCEDURE   You will be observed for 15-30 minutes before being allowed to go home. Do not drive. Have an adult drive you or take a taxi or public transportation instead.   If you feel pain relief, the pain will return in several hours or days when the anesthetic wears off.   You may feel pain relief 2-14 days after the procedure. The amount of time this relief lasts varies from person to person.   It is normal to feel some tenderness over the injected area(s) for 2 days following the procedure.   If you have diabetes, you may have a temporary increase in blood sugar. This information is not intended to replace advice given to you by your health care provider. Make sure you discuss any questions you have with your health care provider. Document Released: 11/24/2006 Document Revised: 07/26/2014 Document Reviewed: 03/31/2015 Elsevier Interactive Patient Education  2017 [employer] GENERAL RISKS AND COMPLICATIONS  What are the risk, side effects and possible complications? Generally speaking, most procedures are safe.  However, with any procedure there are risks, side effects, and the possibility of complications.  The risks and complications are dependent upon the sites that are lesioned, or the type of nerve block to be performed.  The closer the procedure is to the spine, the more serious the risks are.  Great care is taken when placing the radio frequency needles, block needles or lesioning probes, but sometimes complications can occur. 1. Infection: Any time there is an injection through the skin, there is a risk of infection.  This is why sterile conditions are used for these blocks.  There are four possible types of infection. 1. Localized skin infection. 2. Central  Nervous System Infection-This can be in the form of Meningitis, which can be deadly. 3. Epidural Infections-This can be in the form of an epidural abscess, which can cause pressure inside of the spine, causing compression of the spinal cord with subsequent paralysis. This would require an emergency surgery to decompress, and there are no guarantees that the patient would recover from the paralysis. 4. Discitis-This is an infection of the intervertebral discs.  It occurs in about 1% of discography procedures.  It is difficult to treat and it may lead to surgery.        2. Pain: the needles have to go through skin and soft tissues, will cause soreness.       3. Damage to internal structures:  The nerves to be lesioned may be near blood vessels or    other nerves which can be potentially damaged.       4. Bleeding: Bleeding is more common if the patient is  taking blood thinners such as  aspirin, Coumadin, Ticiid, Plavix, etc., or if he/she have some genetic predisposition  such as hemophilia. Bleeding into the spinal canal can cause compression of the spinal  cord with subsequent paralysis.  This would require an emergency surgery to  decompress and there are no guarantees that the patient would recover from the  paralysis.       5. Pneumothorax:  Puncturing of a lung is a possibility, every time a needle is introduced in  the area of the chest or upper back.  Pneumothorax refers to free air around the  collapsed lung(s), inside of the thoracic cavity (chest cavity).  Another two possible  complications related to a similar event would include: Hemothorax and Chylothorax.   These are variations of the Pneumothorax, where instead of air around the collapsed  lung(s), you may have blood or chyle, respectively.       6. Spinal headaches: They may occur with any procedures in the area of the spine.       7. Persistent CSF (Cerebro-Spinal Fluid) leakage: This is a rare problem, but may occur  with prolonged  intrathecal or epidural catheters either due to the formation of a fistulous  track or a dural tear.       8. Nerve damage: By working so close to the spinal cord, there is always a possibility of  nerve damage, which could be as serious as a permanent spinal cord injury with  paralysis.       9. Death:  Although rare, severe deadly allergic reactions known as "Anaphylactic  reaction" can occur to any of the medications used.      10. Worsening of the symptoms:  We can always make thing worse.  What are the chances of something like this happening? Chances of any of this occuring are extremely low.  By statistics, you have more of a chance of getting killed in a motor vehicle accident: while driving to the hospital than any of the above occurring .  Nevertheless, you should be aware that they are possibilities.  In general, it is similar to taking a shower.  Everybody knows that you can slip, hit your head and get killed.  Does that mean that you should not shower again?  Nevertheless always keep in mind that statistics do not mean anything if you happen to be on the wrong side of them.  Even if a procedure has a 1 (one) in a 1,000,000 (million) chance of going wrong, it you happen to be that one..Also, keep in mind that by statistics, you have more of a chance of having something go wrong when taking medications.  Who should not have this procedure? If you are on a blood thinning medication (e.g. Coumadin, Plavix, see list of "Blood Thinners"), or if you have an active infection going on, you should not have the procedure.  If you are taking any blood thinners, please inform your physician.  How should I prepare for this procedure?  Do not eat or drink anything at least six hours prior to the procedure.  Bring a driver with you .  It cannot be a taxi.  Come accompanied by an adult that can drive you back, and that is strong enough to help you if your legs get weak or numb from the local  anesthetic.  Take all of your medicines the morning of the procedure with just enough water to swallow them.  If you have diabetes, make sure  that you are scheduled to have your procedure done first thing in the morning, whenever possible.  If you have diabetes, take only half of your insulin dose and notify our nurse that you have done so as soon as you arrive at the clinic.  If you are diabetic, but only take blood sugar pills (oral hypoglycemic), then do not take them on the morning of your procedure.  You may take them after you have had the procedure.  Do not take aspirin or any aspirin-containing medications, at least eleven (11) days prior to the procedure.  They may prolong bleeding.  Wear loose fitting clothing that may be easy to take off and that you would not mind if it got stained with Betadine or blood.  Do not wear any jewelry or perfume  Remove any nail coloring.  It will interfere with some of our monitoring equipment.  NOTE: Remember that this is not meant to be interpreted as a complete list of all possible complications.  Unforeseen problems may occur.  BLOOD THINNERS The following drugs contain aspirin or other products, which can cause increased bleeding during surgery and should not be taken for 2 weeks prior to and 1 week after surgery.  If you should need take something for relief of minor pain, you may take acetaminophen which is found in Tylenol,m Datril, Anacin-3 and Panadol. It is not blood thinner. The products listed below are.  Do not take any of the products listed below in addition to any listed on your instruction sheet.  A.P.C or A.P.C with Codeine Codeine Phosphate Capsules #3 Ibuprofen Ridaura  ABC compound Congesprin Imuran rimadil  Advil Cope Indocin Robaxisal  Alka-Seltzer Effervescent Pain Reliever and Antacid Coricidin or Coricidin-D  Indomethacin Rufen  Alka-Seltzer plus Cold Medicine Cosprin Ketoprofen S-A-C Tablets  Anacin Analgesic Tablets  or Capsules Coumadin Korlgesic Salflex  Anacin Extra Strength Analgesic tablets or capsules CP-2 Tablets Lanoril Salicylate  Anaprox Cuprimine Capsules Levenox Salocol  Anexsia-D Dalteparin Magan Salsalate  Anodynos Darvon compound Magnesium Salicylate Sine-off  Ansaid Dasin Capsules Magsal Sodium Salicylate  Anturane Depen Capsules Marnal Soma  APF Arthritis pain formula Dewitt's Pills Measurin Stanback  Argesic Dia-Gesic Meclofenamic Sulfinpyrazone  Arthritis Bayer Timed Release Aspirin Diclofenac Meclomen Sulindac  Arthritis pain formula Anacin Dicumarol Medipren Supac  Analgesic (Safety coated) Arthralgen Diffunasal Mefanamic Suprofen  Arthritis Strength Bufferin Dihydrocodeine Mepro Compound Suprol  Arthropan liquid Dopirydamole Methcarbomol with Aspirin Synalgos  ASA tablets/Enseals Disalcid Micrainin Tagament  Ascriptin Doan's Midol Talwin  Ascriptin A/D Dolene Mobidin Tanderil  Ascriptin Extra Strength Dolobid Moblgesic Ticlid  Ascriptin with Codeine Doloprin or Doloprin with Codeine Momentum Tolectin  Asperbuf Duoprin Mono-gesic Trendar  Aspergum Duradyne Motrin or Motrin IB Triminicin  Aspirin plain, buffered or enteric coated Durasal Myochrisine Trigesic  Aspirin Suppositories Easprin Nalfon Trillsate  Aspirin with Codeine Ecotrin Regular or Extra Strength Naprosyn Uracel  Atromid-S Efficin Naproxen Ursinus  Auranofin Capsules Elmiron Neocylate Vanquish  Axotal Emagrin Norgesic Verin  Azathioprine Empirin or Empirin with Codeine Normiflo Vitamin E  Azolid Emprazil Nuprin Voltaren  Bayer Aspirin plain, buffered or children's or timed BC Tablets or powders Encaprin Orgaran Warfarin Sodium  Buff-a-Comp Enoxaparin Orudis Zorpin  Buff-a-Comp with Codeine Equegesic Os-Cal-Gesic   Buffaprin Excedrin plain, buffered or Extra Strength Oxalid   Bufferin Arthritis Strength Feldene Oxphenbutazone   Bufferin plain or Extra Strength Feldene Capsules Oxycodone with Aspirin   Bufferin  with Codeine Fenoprofen Fenoprofen Pabalate or Pabalate-SF   Buffets II Flogesic Panagesic   Buffinol plain  or Extra Strength Florinal or Florinal with Codeine Panwarfarin   Buf-Tabs Flurbiprofen Penicillamine   Butalbital Compound Four-way cold tablets Penicillin   Butazolidin Fragmin Pepto-Bismol   Carbenicillin Geminisyn Percodan   Carna Arthritis Reliever Geopen Persantine   Carprofen Gold's salt Persistin   Chloramphenicol Goody's Phenylbutazone   Chloromycetin Haltrain Piroxlcam   Clmetidine heparin Plaquenil   Cllnoril Hyco-pap Ponstel   Clofibrate Hydroxy chloroquine Propoxyphen         Before stopping any of these medications, be sure to consult the physician who ordered them.  Some, such as Coumadin (Warfarin) are ordered to prevent or treat serious conditions such as "deep thrombosis", "pumonary embolisms", and other heart problems.  The amount of time that you may need off of the medication may also vary with the medication and the reason for which you were taking it.  If you are taking any of these medications, please make sure you notify your pain physician before you undergo any procedures.

## 2016-07-28 NOTE — Progress Notes (Signed)
Nursing Pain Medication Assessment:  Safety precautions to be maintained throughout the outpatient stay will include: orient to surroundings, keep bed in low position, maintain call bell within reach at all times, provide assistance with transfer out of bed and ambulation.  Medication Inspection Compliance: Pill count conducted under aseptic conditions, in front of the patient. Neither the pills nor the bottle was removed from the patient's sight at any time. Once count was completed pills were immediately returned to the patient in their original bottle.  Medication: See above Pill Count: 22 of 120 pills remain Bottle Appearance: Standard pharmacy container. Clearly labeled. Filled Date: 812 / 16 / 2017 Medication last intake:07/28/16 @ 0600

## 2016-07-28 NOTE — Progress Notes (Signed)
Patient's Name: Erica [name]  MRN: [id number]  Referring Provider: [facility]  DOB: 1965/07/20  PCP: [facility]  DOS: 07/28/2016  Note by: Kathlen Brunswick. Dossie Arbour, MD  Service setting: Ambulatory outpatient  Specialty: Interventional Pain Management  Location: [facility] (AMB) Pain Management Facility    Patient type: Established   Primary Reason(s) for Visit: Encounter for prescription drug management & post-procedure evaluation of chronic illness with mild to moderate exacerbation(Level of risk: moderate) CC: Back Pain (lower bilateral) and Knee Pain (bilateral)  HPI  Erica [name] is a 51 y.o. year old, female patient, who comes today for a post-procedure evaluation and medication management. She has Hyponatremia; Shortness of breath; Swelling; Cough; COPD (chronic obstructive pulmonary disease) with acute bronchitis ([place]); Renal insufficiency; Pulmonary HTN; COPD, mild ([place]); Chronic diastolic heart failure ([place]); Smoker; Obstructive sleep apnea; Tachycardia; Depression; Long term current use of opiate analgesic; Long term prescription opiate use; Opiate use; Chronic low back pain (Location of Primary Source of Pain) (Bilateral) (L>R); Chronic hip pain (Location of Secondary source of pain) (Bilateral) (L>R); Chronic knee pain (Location of Tertiary source of pain) (Bilateral) (L>R); Chronic neck pain (Bilateral) (R>L); Chronic upper back pain (midline); Chronic foot pain (bottom of feet) (Bilateral) (R>L); Chronic hand pain (Bilateral); Peripheral neuropathy, idiopathic (upper and lower extremity); History of congestive heart failure; Chronic sacroiliac joint pain (Bilateral) (R>L); Lumbar facet syndrome (Bilateral) (R>L); Chronic pain syndrome; Elevated sedimentation rate; Elevated C-reactive protein (CRP); Neurogenic pain; Vitamin D deficiency; and Lumbar spondylosis on her problem list. Her primarily concern today is the Back Pain (lower bilateral) and Knee Pain  (bilateral)  Pain Assessment: Self-Reported Pain Score: 1 /10             Reported level is compatible with observation.       Pain Type: Chronic pain Pain Location: Back Pain Orientation: Lower, Left, Right Pain Descriptors / Indicators: Aching (aching in the hip has just started back.  neck pain feels as if something is out of place or a pulled muscle.  knee pain is achey and constant, sharp, stabbing) Pain Frequency: Constant  Erica [name] was last seen on 52/18/2017 for a procedure. During today's appointment we reviewed Erica [name]'s post-procedure results, as well as her outpatient medication regimen.  Further details on both, my assessment(s), as well as the proposed treatment plan, please see below.  Controlled Substance Pharmacotherapy Assessment REMS (Risk Evaluation and Mitigation Strategy)  Analgesic:Hydrocodone/APAP 5/325 one tablet every 6 hours (20 mg/day) MME/day:34m/day PJanett Billow RN  07/28/2016  2:38 PM  Sign at close encounter Nursing Pain Medication Assessment:  Safety precautions to be maintained throughout the outpatient stay will include: orient to surroundings, keep bed in low position, maintain call bell within reach at all times, provide assistance with transfer out of bed and ambulation.  Medication Inspection Compliance: Pill count conducted under aseptic conditions, in front of the patient. Neither the pills nor the bottle was removed from the patient's sight at any time. Once count was completed pills were immediately returned to the patient in their original bottle.  Medication: See above Pill Count: 22 of 120 pills remain Bottle Appearance: Standard pharmacy container. Clearly labeled. Filled Date: 169/ 16 / 2017 Medication last intake:07/28/16 @ 0600   Pharmacokinetics: Liberation and absorption (onset of action): WNL Distribution (time to peak effect): WNL Metabolism and excretion (duration of action): WNL          Pharmacodynamics: Desired effects: Analgesia: [name] >50% benefit.  Functional ability: Patient reports that medication allows her to accomplish basic ADLs Clinically meaningful improvement in function (CMIF): Sustained CMIF goals met Perceived effectiveness: Described as relatively effective, allowing for increase in activities of daily living (ADL) Undesirable effects: Side-effects or Adverse reactions: None reported Monitoring: [place] PMP: Online review of the past 40-monthperiod conducted. Compliant with practice rules and regulations List of all UDS test(s) done:  Lab Results  Component Value Date   SUMMARY FINAL 04/29/2016   Last UDS on record: No results found for: TOXASSSELUR UDS interpretation: Compliant          Medication Assessment Form: Reviewed. Patient indicates being compliant with therapy Treatment compliance: Compliant Risk Assessment Profile: Aberrant behavior: See prior evaluations. None observed or detected today Comorbid factors increasing risk of overdose: See prior notes. No additional risks detected today Risk of substance use disorder (SUD): Low Opioid Risk Tool (ORT) Total Score: 1  Interpretation Table:  Score <3 = Low Risk for SUD  Score between 4-7 = Moderate Risk for SUD  Score >8 = High Risk for Opioid Abuse   Risk Mitigation Strategies:  Patient Counseling: Covered Patient-Prescriber Agreement (PPA): Present and active  Notification to other healthcare providers: Done  Pharmacologic Plan: No change in therapy, at this time  Post-Procedure Assessment  07/05/2016 Procedure: Diagnostic bilateral lumbar facet block #1 under fluoroscopic guidance and IV sedation Post-procedure pain score: 0/10 (100% relief) Influential Factors: BMI: 29.50 kg/m Intra-procedural challenges: None observed Assessment challenges: None detected         Post-procedural side-effects, adverse reactions, or complications: None reported Reported issues:  None  Sedation: Sedation provided. When no sedatives are used, the analgesic levels obtained are directly associated to the effectiveness of the local anesthetics. However, when sedation is provided, the level of analgesia obtained during the initial 1 hour following the intervention, is believed to be the result of a combination of factors. These factors may include, but are not limited to: 1. The effectiveness of the local anesthetics used. 2. The effects of the analgesic(s) and/or anxiolytic(s) used. 3. The degree of discomfort experienced by the patient at the time of the procedure. 4. The patients ability and reliability in recalling and recording the events. 5. The presence and influence of possible secondary gains and/or psychosocial factors. Reported result: Relief experienced during the 1st hour after the procedure: 100 % (Ultra-Short Term Relief) Interpretative annotation: Analgesia during this period is likely to be Local Anesthetic and/or IV Sedative (Analgesic/Anxiolitic) related.          Effects of local anesthetic: The analgesic effects attained during this period are directly associated to the localized infiltration of local anesthetics and therefore cary significant diagnostic value as to the etiological location, or anatomical origin, of the pain. Expected duration of relief is directly dependent on the pharmacodynamics of the local anesthetic used. Long-acting (4-6 hours) anesthetics used.  Reported result: Relief during the next 4 to 6 hour after the procedure: 100 % (Short-Term Relief) Interpretative annotation: Complete relief would suggest area to be the source of the pain.          Long-term benefit: Defined as the period of time past the expected duration of local anesthetics. With the possible exception of prolonged sympathetic blockade from the local anesthetics, benefits during this period are typically attributed to, or associated with, other factors such as analgesic  sensory neuropraxia, antiinflammatory effects, or beneficial biochemical changes provided by agents other than the local anesthetics Reported result: Extended relief following procedure: 100 % (  up until the past couple of days she has not been hurting but over the past couple of days her back has felt achy. ) (Long-Term Relief) Interpretative annotation: Good relief. This could suggest inflammation to be a significant component in the etiology to the pain.          Current benefits: Defined as persistent relief that continues at this point in time.   Reported results: Treated area: 75 % In addition, the patient reports improvement in function Interpretative annotation: Ongoing benefits would suggest effective therapeutic approach  Interpretation: Results would suggest therapy to have a positive impact on the patient's condition. We'll proceed with diagnostic intervention #2, as soon as convenient  Laboratory Chemistry  Inflammation Markers Lab Results  Component Value Date   ESRSEDRATE 31 (H) 04/30/2016   CRP 1.3 (H) 04/30/2016   Renal Function Lab Results  Component Value Date   BUN 14 06/14/2016   CREATININE 0.95 06/14/2016   GFRAA >60 06/14/2016   GFRNONAA >60 06/14/2016   Hepatic Function Lab Results  Component Value Date   AST 27 06/14/2016   ALT 26 06/14/2016   ALBUMIN 4.2 06/14/2016   Electrolytes Lab Results  Component Value Date   NA 136 06/14/2016   K 3.4 (L) 06/14/2016   CL 101 06/14/2016   CALCIUM 9.4 06/14/2016   MG 1.9 04/30/2016   Pain Modulating Vitamins Lab Results  Component Value Date   25OHVITD1 15 (L) 04/30/2016   25OHVITD2 <1.0 04/30/2016   25OHVITD3 15 04/30/2016   VITAMINB12 248 04/30/2016   Coagulation Parameters Lab Results  Component Value Date   PLT 249 06/14/2016   Cardiovascular Lab Results  Component Value Date   BNP 42.0 06/14/2016   HGB 13.9 06/14/2016   HCT 40.7 06/14/2016   Note: Lab results reviewed.  Recent Diagnostic  Imaging Review  Dg C-arm 1-60 Min-no Report  Result Date: 07/05/2016 There is no Radiologist interpretation  for this exam.  Note: Imaging results reviewed.          Meds  The patient has a current medication list which includes the following prescription(s): albuterol, aspirin, vitamin d3, diltiazem, duloxetine, fluticasone-salmeterol, folic acid, furosemide, hydrocodone-acetaminophen, pantoprazole, potassium chloride sa, pregabalin, sucralfate, tiotropium, and vitamin d (ergocalciferol).  Current Outpatient Prescriptions on File Prior to Visit  Medication Sig  . albuterol (PROAIR HFA) 108 (90 Base) MCG/ACT inhaler Inhale into the lungs 4 (four) times daily.  Marland Kitchen aspirin EC 81 MG EC tablet Take 1 tablet (81 mg total) by mouth daily.  . Cholecalciferol (VITAMIN D3) 2000 units capsule Take 1 capsule (2,000 Units total) by mouth daily.  Marland Kitchen diltiazem (CARDIZEM) 30 MG tablet Take 1 tablet (30 mg total) by mouth 3 (three) times daily as needed.  . DULoxetine (CYMBALTA) 60 MG capsule Take 60 mg by mouth daily.  . Fluticasone-Salmeterol (ADVAIR DISKUS IN) Inhale into the lungs 2 (two) times daily.  . folic acid (FOLVITE) 1 MG tablet Take 1 mg by mouth daily.  . furosemide (LASIX) 40 MG tablet Take 1 tablet (40 mg total) by mouth daily. Hold if SBP<110 or DBP<60 (Patient taking differently: Take 40 mg by mouth daily. Hold if SBP<110 or DBP<60)  . pantoprazole (PROTONIX) 40 MG tablet Take 1 tablet (40 mg total) by mouth daily.  . potassium chloride SA (K-DUR,KLOR-CON) 20 MEQ tablet Take 20 mEq by mouth 2 (two) times daily.  . pregabalin (LYRICA) 50 MG capsule Take 1 capsule (50 mg total) by mouth 3 (three) times daily.  Marland Kitchen  sucralfate (CARAFATE) 1 G tablet Take 1 tablet (1 g total) by mouth 4 (four) times daily -  with meals and at bedtime.  Marland Kitchen tiotropium (SPIRIVA HANDIHALER) 18 MCG inhalation capsule Place 18 mcg into inhaler and inhale daily.  . Vitamin D, Ergocalciferol, (DRISDOL) 50000 units CAPS  capsule Take 1 capsule (50,000 Units total) by mouth 2 (two) times a week. X 6 weeks.   No current facility-administered medications on file prior to visit.    ROS  Constitutional: Denies any fever or chills Gastrointestinal: No reported hemesis, hematochezia, vomiting, or acute GI distress Musculoskeletal: Denies any acute onset joint swelling, redness, loss of ROM, or weakness Neurological: No reported episodes of acute onset apraxia, aphasia, dysarthria, agnosia, amnesia, paralysis, loss of coordination, or loss of consciousness  Allergies  Ms. Ohlendorf is allergic to tramadol and penicillins.  [place]  Drug: Ms. Runnion  reports that she does not use drugs. Alcohol:  reports that she drinks alcohol. Tobacco:  reports that she quit smoking about 13 months ago. Her smoking use included Cigarettes. She has a 36.00 pack-year smoking history. She has never used smokeless tobacco. Medical:  has a past medical history of Arthritis; Asthma; CHF (congestive heart failure) ([place]); COPD (chronic obstructive pulmonary disease) ([place]); Gastric ulcer; Hyperlipidemia; Hypertension; and OSA on CPAP (no ins - no cpap machine for the last 4 years). Family: family history includes Breast cancer in her mother; Diabetes in her maternal grandmother; Lung cancer in her father.  Past Surgical History:  Procedure Laterality Date  . FLEXIBLE BRONCHOSCOPY N/A 06/20/2015   Procedure: FLEXIBLE BRONCHOSCOPY;  Surgeon: Laverle Hobby, MD;  Location: [facility] ORS;  Service: Pulmonary;  Laterality: N/A;  . KNEE SURGERY Left    8 knee surgeries   Constitutional Exam  General appearance: Well nourished, well developed, and well hydrated. In no apparent acute distress Vitals:   07/28/16 1426  BP: (!) 134/96  Pulse: 90  Resp: 16  Temp: 98.3 F (36.8 C)  TempSrc: Oral  SpO2: 95%  Weight: 194 lb (88 kg)  Height: 5' 8" (1.727 m)   BMI Assessment: Estimated body mass index is 29.5 kg/m as calculated from the  following:   Height as of this encounter: 5' 8" (1.727 m).   Weight as of this encounter: 194 lb (88 kg).  BMI interpretation table: BMI level Category Range association with higher incidence of chronic pain  <18 kg/m2 Underweight   18.5-24.9 kg/m2 Ideal body weight   25-29.9 kg/m2 Overweight Increased incidence by 20%  30-34.9 kg/m2 Obese (Class I) Increased incidence by 68%  35-39.9 kg/m2 Severe obesity (Class II) Increased incidence by 136%  >40 kg/m2 Extreme obesity (Class III) Increased incidence by 254%   BMI Readings from Last 4 Encounters:  07/28/16 29.50 kg/m  07/05/16 28.89 kg/m  06/29/16 28.89 kg/m  06/14/16 28.89 kg/m   Wt Readings from Last 4 Encounters:  07/28/16 194 lb (88 kg)  07/05/16 190 lb (86.2 kg)  06/29/16 190 lb (86.2 kg)  06/14/16 190 lb (86.2 kg)  Psych/Mental status: Alert, oriented x 3 (person, place, & time) Eyes: PERLA Respiratory: No evidence of acute respiratory distress  Cervical Spine Exam  Inspection: No masses, redness, or swelling Alignment: Symmetrical Functional ROM: Unrestricted ROM Stability: No instability detected Muscle strength & Tone: Functionally intact Sensory: Unimpaired Palpation: Non-contributory  Upper Extremity (UE) Exam    Side: Right upper extremity  Side: Left upper extremity  Inspection: No masses, redness, swelling, or asymmetry  Inspection: No masses, redness, swelling,  or asymmetry  Functional ROM: Unrestricted ROM          Functional ROM: Unrestricted ROM          Muscle strength & Tone: Functionally intact  Muscle strength & Tone: Functionally intact  Sensory: Unimpaired  Sensory: Unimpaired  Palpation: Non-contributory  Palpation: Non-contributory   Thoracic Spine Exam  Inspection: No masses, redness, or swelling Alignment: Symmetrical Functional ROM: Unrestricted ROM Stability: No instability detected Sensory: Unimpaired Muscle strength & Tone: Functionally intact Palpation:  Non-contributory  Lumbar Spine Exam  Inspection: No masses, redness, or swelling Alignment: Symmetrical Functional ROM: Unrestricted ROM Stability: No instability detected Muscle strength & Tone: Functionally intact Sensory: Unimpaired Palpation: Non-contributory Provocative Tests: Lumbar Hyperextension and rotation test: evaluation deferred today       Patrick's Maneuver: evaluation deferred today              Gait & Posture Assessment  Ambulation: Unassisted Gait: Relatively normal for age and body habitus Posture: WNL   Lower Extremity Exam    Side: Right lower extremity  Side: Left lower extremity  Inspection: No masses, redness, swelling, or asymmetry  Inspection: No masses, redness, swelling, or asymmetry  Functional ROM: Unrestricted ROM          Functional ROM: Unrestricted ROM          Muscle strength & Tone: Functionally intact  Muscle strength & Tone: Functionally intact  Sensory: Unimpaired  Sensory: Unimpaired  Palpation: Non-contributory  Palpation: Non-contributory   Assessment  Primary Diagnosis & Pertinent Problem List: The primary encounter diagnosis was Chronic pain syndrome. Diagnoses of Chronic low back pain (Location of Primary Source of Pain) (Bilateral) (L>R), Lumbar facet syndrome (Bilateral) (R>L), Chronic hip pain, unspecified laterality, Chronic knee pain (Location of Tertiary source of pain) (Bilateral) (L>R), Long term prescription opiate use, and Opiate use were also pertinent to this visit.  Status Diagnosis  Stable Stable Stable 1. Chronic pain syndrome   2. Chronic low back pain (Location of Primary Source of Pain) (Bilateral) (L>R)   3. Lumbar facet syndrome (Bilateral) (R>L)   4. Chronic hip pain, unspecified laterality   5. Chronic knee pain (Location of Tertiary source of pain) (Bilateral) (L>R)   6. Long term prescription opiate use   7. Opiate use      Plan of Care  Pharmacotherapy (Medications Ordered): Meds ordered this encounter   Medications  . HYDROcodone-acetaminophen (NORCO/VICODIN) 5-325 MG tablet    Sig: Take 1 tablet by mouth every 6 (six) hours as needed for moderate pain.    Dispense:  120 tablet    Refill:  0    Patient may have prescription filled one day early if pharmacy is closed on scheduled refill date. Do not fill until: 07/29/16 To last until: 08/28/16   New Prescriptions   No medications on file   Medications administered today: Ms. Tony had no medications administered during this visit. Lab-work, procedure(s), and/or referral(s): Orders Placed This Encounter  Procedures  . LUMBAR FACET(MEDIAL BRANCH NERVE BLOCK) MBNB  . ToxASSURE Select 13 (MW), Urine   Imaging and/or referral(s): None  Interventional therapies: Planned, scheduled, and/or pending:   Diagnostic bilateral lumbar facet block #2 under fluoroscopic guidance and IV sedation    Considering:   Diagnostic bilateral lumbar facet block #2 under fluoroscopic guidance and IV sedation  Possible bilateral lumbar facet radiofrequency ablation  Possible bilateral diagnostic sacroiliac joint block under fluoroscopic guidance and IV sedation.  Possible bilateral intra-articular hip joint injection under fluoroscopic guidance and  IV sedation.  Possible bilateral intra-articular knee joint injection, without fluoroscopy or IV sedation.  Possible bilateral genicular nerve block under fluoroscopic guidance and IV sedation.  Possible bilateral knee joint radiofrequency ablation.  Possible left-sided lumbar epidural steroid injection under fluoroscopic guidance, with or without sedation.  Possible right-sided cervical epidural steroid injection under fluoroscopic guidance and IV sedation.  Possible bilateral diagnostic cervical facet block under fluoroscopic guidance and IV sedation.  Possible bilateral diagnostic greater occipital nerve blocks under fluoroscopic guidance, with or without sedation. Possible greater occipital nerve  radiofrequency ablation.   Palliative PRN treatment(s):   Not at this time.   Provider-requested follow-up: Return in about 1 month (around 08/28/2016) for (MD) Med-Mgmt, in addition, procedure (ASAA).  Future Appointments Date Time Provider [place]  08/09/2016 9:15 AM Milinda Pointer, MD [facility] None  08/31/2016 1:00 PM Alisa Graff, [place] [facility] None   Primary Care Physician: [place] Location: [facility] Outpatient [facility] Management Facility Note by: Kathlen Brunswick. Dossie Arbour, M.D, DABA, DABAPM, DABPM, DABIPP, FIPP Date: 07/28/16; Time: 9:39 PM  Pain Score Disclaimer: We use the NRS-11 scale. This is a self-reported, subjective measurement of pain severity with only modest accuracy. It is used primarily to identify changes within a particular patient. It must be understood that outpatient pain scales are significantly less accurate that those used for research, where they can be applied under ideal controlled circumstances with minimal exposure to variables. In reality, the score is likely to be a combination of pain intensity and pain affect, where pain affect describes the degree of emotional arousal or changes in action readiness caused by the sensory experience of pain. Factors such as social and work situation, setting, emotional state, anxiety levels, expectation, and prior pain experience may influence pain perception and show large inter-individual differences that may also be affected by time variables.  Patient instructions provided during this appointment: Patient Instructions   Facet Joint Block The facet joints connect the bones of the spine (vertebrae). They make it possible for you to bend, twist, and make other movements with your spine. They also prevent you from overbending, overtwisting, and making other excessive movements.  A facet joint block is a procedure where a numbing medicine (anesthetic) is injected into a facet joint. Often, a  type of anti-inflammatory medicine called a steroid is also injected. A facet joint block may be done for two reasons:   Diagnosis. A facet joint block may be done as a test to see whether neck or back pain is caused by a worn-down or infected facet joint. If the pain gets better after a facet joint block, it means the pain is probably coming from the facet joint. If the pain does not get better, it means the pain is probably not coming from the facet joint.   Therapy. A facet joint block may be done to relieve neck or back pain caused by a facet joint. A facet joint block is only done as a therapy if the pain does not improve with medicine, exercise programs, physical therapy, and other forms of pain management. LET [facility] CARE PROVIDER KNOW ABOUT:   Any allergies you have.   All medicines you are taking, including vitamins, herbs, eyedrops, and over-the-counter medicines and creams.   Previous problems you or members of your family have had with the use of anesthetics.   Any blood disorders you have had.   Other health problems you have. RISKS AND COMPLICATIONS Generally, having a facet joint block  is safe. However, as with any procedure, complications can occur. Possible complications associated with having a facet joint block include:   Bleeding.   Injury to a nerve near the injection site.   Pain at the injection site.   Weakness or numbness in areas controlled by nerves near the injection site.   Infection.   Temporary fluid retention.   Allergic reaction to anesthetics or medicines used during the procedure. BEFORE THE PROCEDURE   Follow your health care provider's instructions if you are taking dietary supplements or medicines. You may need to stop taking them or reduce your dosage.   Do not take any new dietary supplements or medicines without asking your health care provider first.   Follow your health care provider's instructions about eating and  drinking before the procedure. You may need to stop eating and drinking several hours before the procedure.   Arrange to have an adult drive you home after the procedure. PROCEDURE  You may need to remove your clothing and dress in an open-back gown so that your health care provider can access your spine.   The procedure will be done while you are lying on an X-ray table. Most of the time you will be asked to lie on your stomach, but you may be asked to lie in a different position if an injection will be made in your neck.   Special machines will be used to monitor your oxygen levels, heart rate, and blood pressure.   If an injection will be made in your neck, an intravenous (IV) tube will be inserted into one of your veins. Fluids and medicine will flow directly into your body through the IV tube.   The area over the facet joint where the injection will be made will be cleaned with an antiseptic soap. The surrounding skin will be covered with sterile drapes.   An anesthetic will be applied to your skin to make the injection area numb. You may feel a temporary stinging or burning sensation.   A video X-ray machine will be used to locate the joint. A contrast dye may be injected into the facet joint area to help with locating the joint.   When the joint is located, an anesthetic medicine will be injected into the joint through the needle.   Your health care provider will ask you whether you feel pain relief. If you do feel relief, a steroid may be injected to provide pain relief for a longer period of time. If you do not feel relief or feel only partial relief, additional injections of an anesthetic may be made in other facet joints.   The needle will be removed, the skin will be cleansed, and bandages will be applied.  AFTER THE PROCEDURE   You will be observed for 15-30 minutes before being allowed to go home. Do not drive. Have an adult drive you or take a taxi or public  transportation instead.   If you feel pain relief, the pain will return in several hours or days when the anesthetic wears off.   You may feel pain relief 2-14 days after the procedure. The amount of time this relief lasts varies from person to person.   It is normal to feel some tenderness over the injected area(s) for 2 days following the procedure.   If you have diabetes, you may have a temporary increase in blood sugar. This information is not intended to replace advice given to you by your health care provider. Make  sure you discuss any questions you have with your health care provider. Document Released: 11/24/2006 Document Revised: 07/26/2014 Document Reviewed: 03/31/2015 Elsevier Interactive Patient Education  2017 [place]  What are the risk, side effects and possible complications? Generally speaking, most procedures are safe.  However, with any procedure there are risks, side effects, and the possibility of complications.  The risks and complications are dependent upon the sites that are lesioned, or the type of nerve block to be performed.  The closer the procedure is to the spine, the more serious the risks are.  Great care is taken when placing the radio frequency needles, block needles or lesioning probes, but sometimes complications can occur. 1. Infection: Any time there is an injection through the skin, there is a risk of infection.  This is why sterile conditions are used for these blocks.  There are four possible types of infection. 1. Localized skin infection. 2. Central Nervous System Infection-This can be in the form of Meningitis, which can be deadly. 3. Epidural Infections-This can be in the form of an epidural abscess, which can cause pressure inside of the spine, causing compression of the spinal cord with subsequent paralysis. This would require an emergency surgery to decompress, and there are no guarantees that the patient  would recover from the paralysis. 4. Discitis-This is an infection of the intervertebral discs.  It occurs in about 1% of discography procedures.  It is difficult to treat and it may lead to surgery.        2. Pain: the needles have to go through skin and soft tissues, will cause soreness.       3. Damage to internal structures:  The nerves to be lesioned may be near blood vessels or    other nerves which can be potentially damaged.       4. Bleeding: Bleeding is more common if the patient is taking blood thinners such as  aspirin, Coumadin, Ticiid, Plavix, etc., or if he/she have some genetic predisposition  such as hemophilia. Bleeding into the spinal canal can cause compression of the spinal  cord with subsequent paralysis.  This would require an emergency surgery to  decompress and there are no guarantees that the patient would recover from the  paralysis.       5. Pneumothorax:  Puncturing of a lung is a possibility, every time a needle is introduced in  the area of the chest or upper back.  Pneumothorax refers to free air around the  collapsed lung(s), inside of the thoracic cavity (chest cavity).  Another two possible  complications related to a similar event would include: Hemothorax and Chylothorax.   These are variations of the Pneumothorax, where instead of air around the collapsed  lung(s), you may have blood or chyle, respectively.       6. Spinal headaches: They may occur with any procedures in the area of the spine.       7. Persistent CSF (Cerebro-Spinal Fluid) leakage: This is a rare problem, but may occur  with prolonged intrathecal or epidural catheters either due to the formation of a fistulous  track or a dural tear.       8. Nerve damage: By working so close to the spinal cord, there is always a possibility of  nerve damage, which could be as serious as a permanent spinal cord injury with  paralysis.       9. Death:  Although rare, severe deadly allergic reactions known  as  "Anaphylactic  reaction" can occur to any of the medications used.      10. Worsening of the symptoms:  We can always make thing worse.  What are the chances of something like this happening? Chances of any of this occuring are extremely low.  By statistics, you have more of a chance of getting killed in a motor vehicle accident: while driving to the hospital than any of the above occurring .  Nevertheless, you should be aware that they are possibilities.  In general, it is similar to taking a shower.  Everybody knows that you can slip, hit your head and get killed.  Does that mean that you should not shower again?  Nevertheless always keep in mind that statistics do not mean anything if you happen to be on the wrong side of them.  Even if a procedure has a 1 (one) in a 1,000,000 (million) chance of going wrong, it you happen to be that one..Also, keep in mind that by statistics, you have more of a chance of having something go wrong when taking medications.  Who should not have this procedure? If you are on a blood thinning medication (e.g. Coumadin, Plavix, see list of "Blood Thinners"), or if you have an active infection going on, you should not have the procedure.  If you are taking any blood thinners, please inform your physician.  How should I prepare for this procedure?  Do not eat or drink anything at least six hours prior to the procedure.  Bring a driver with you .  It cannot be a taxi.  Come accompanied by an adult that can drive you back, and that is strong enough to help you if your legs get weak or numb from the local anesthetic.  Take all of your medicines the morning of the procedure with just enough water to swallow them.  If you have diabetes, make sure that you are scheduled to have your procedure done first thing in the morning, whenever possible.  If you have diabetes, take only half of your insulin dose and notify our nurse that you have done so as soon as you arrive at the  clinic.  If you are diabetic, but only take blood sugar pills (oral hypoglycemic), then do not take them on the morning of your procedure.  You may take them after you have had the procedure.  Do not take aspirin or any aspirin-containing medications, at least eleven (11) days prior to the procedure.  They may prolong bleeding.  Wear loose fitting clothing that may be easy to take off and that you would not mind if it got stained with Betadine or blood.  Do not wear any jewelry or perfume  Remove any nail coloring.  It will interfere with some of our monitoring equipment.  NOTE: Remember that this is not meant to be interpreted as a complete list of all possible complications.  Unforeseen problems may occur.  BLOOD THINNERS The following drugs contain aspirin or other products, which can cause increased bleeding during surgery and should not be taken for 2 weeks prior to and 1 week after surgery.  If you should need take something for relief of minor pain, you may take acetaminophen which is found in Tylenol,m Datril, Anacin-3 and Panadol. It is not blood thinner. The products listed below are.  Do not take any of the products listed below in addition to any listed on your instruction sheet.  A.P.C or A.P.C with Codeine Codeine  Phosphate Capsules #3 Ibuprofen Ridaura  ABC compound Congesprin Imuran rimadil  Advil Cope Indocin Robaxisal  Alka-Seltzer Effervescent Pain Reliever and Antacid Coricidin or Coricidin-D  Indomethacin Rufen  Alka-Seltzer plus Cold Medicine Cosprin Ketoprofen S-A-C Tablets  Anacin Analgesic Tablets or Capsules Coumadin Korlgesic Salflex  Anacin Extra Strength Analgesic tablets or capsules CP-2 Tablets Lanoril Salicylate  Anaprox Cuprimine Capsules Levenox Salocol  Anexsia-D Dalteparin Magan Salsalate  Anodynos Darvon compound Magnesium Salicylate Sine-off  Ansaid Dasin Capsules Magsal Sodium Salicylate  Anturane Depen Capsules Marnal Soma  APF Arthritis pain  formula Dewitt's Pills Measurin Stanback  Argesic Dia-Gesic Meclofenamic Sulfinpyrazone  Arthritis Bayer Timed Release Aspirin Diclofenac Meclomen Sulindac  Arthritis pain formula Anacin Dicumarol Medipren Supac  Analgesic (Safety coated) Arthralgen Diffunasal Mefanamic Suprofen  Arthritis Strength Bufferin Dihydrocodeine Mepro Compound Suprol  Arthropan liquid Dopirydamole Methcarbomol with Aspirin Synalgos  ASA tablets/Enseals Disalcid Micrainin Tagament  Ascriptin Doan's Midol Talwin  Ascriptin A/D Dolene Mobidin Tanderil  Ascriptin Extra Strength Dolobid Moblgesic Ticlid  Ascriptin with Codeine Doloprin or Doloprin with Codeine Momentum Tolectin  Asperbuf Duoprin Mono-gesic Trendar  Aspergum Duradyne Motrin or Motrin IB Triminicin  Aspirin plain, buffered or enteric coated Durasal Myochrisine Trigesic  Aspirin Suppositories Easprin Nalfon Trillsate  Aspirin with Codeine Ecotrin Regular or Extra Strength Naprosyn Uracel  Atromid-S Efficin Naproxen Ursinus  Auranofin Capsules Elmiron Neocylate Vanquish  Axotal Emagrin Norgesic Verin  Azathioprine Empirin or Empirin with Codeine Normiflo Vitamin E  Azolid Emprazil Nuprin Voltaren  Bayer Aspirin plain, buffered or children's or timed BC Tablets or powders Encaprin Orgaran Warfarin Sodium  Buff-a-Comp Enoxaparin Orudis Zorpin  Buff-a-Comp with Codeine Equegesic Os-Cal-Gesic   Buffaprin Excedrin plain, buffered or Extra Strength Oxalid   Bufferin Arthritis Strength Feldene Oxphenbutazone   Bufferin plain or Extra Strength Feldene Capsules Oxycodone with Aspirin   Bufferin with Codeine Fenoprofen Fenoprofen Pabalate or Pabalate-SF   Buffets II Flogesic Panagesic   Buffinol plain or Extra Strength Florinal or Florinal with Codeine Panwarfarin   Buf-Tabs Flurbiprofen Penicillamine   Butalbital Compound Four-way cold tablets Penicillin   Butazolidin Fragmin Pepto-Bismol   Carbenicillin Geminisyn Percodan   Carna Arthritis Reliever Geopen  Persantine   Carprofen Gold's salt Persistin   Chloramphenicol Goody's Phenylbutazone   Chloromycetin Haltrain Piroxlcam   Clmetidine heparin Plaquenil   Cllnoril Hyco-pap Ponstel   Clofibrate Hydroxy chloroquine Propoxyphen         Before stopping any of these medications, be sure to consult the physician who ordered them.  Some, such as Coumadin (Warfarin) are ordered to prevent or treat serious conditions such as "deep thrombosis", "pumonary embolisms", and other heart problems.  The amount of time that you may need off of the medication may also vary with the medication and the reason for which you were taking it.  If you are taking any of these medications, please make sure you notify your pain physician before you undergo any procedures.

## 2016-08-09 ENCOUNTER — Encounter: Payer: Self-pay | Admitting: Pain Medicine

## 2016-08-09 ENCOUNTER — Ambulatory Visit
Admission: RE | Admit: 2016-08-09 | Discharge: 2016-08-09 | Disposition: A | Discharge status: Home / Self Care | Payer: Medicaid Other | Source: Ambulatory Visit | Attending: Pain Medicine | Admitting: Pain Medicine

## 2016-08-09 ENCOUNTER — Ambulatory Visit (HOSPITAL_BASED_OUTPATIENT_CLINIC_OR_DEPARTMENT_OTHER): Payer: Medicaid Other | Admitting: Pain Medicine

## 2016-08-09 VITALS — BP 127/56 | HR 66 | Temp 97.1°F | Resp 16 | Ht 68.0 in | Wt 190.0 lb

## 2016-08-09 DIAGNOSIS — M1288 Other specific arthropathies, not elsewhere classified, other specified site: Secondary | ICD-10-CM | POA: Insufficient documentation

## 2016-08-09 DIAGNOSIS — M47816 Spondylosis without myelopathy or radiculopathy, lumbar region: Secondary | ICD-10-CM

## 2016-08-09 MED ORDER — MIDAZOLAM HCL 5 MG/5ML IJ SOLN
1.0000 mg | INTRAMUSCULAR | Status: DC | PRN
  Administered 2016-08-09: 3 mg via INTRAVENOUS
  Filled 2016-08-09: qty 5

## 2016-08-09 MED ORDER — FENTANYL CITRATE (PF) 100 MCG/2ML IJ SOLN
25.0000 ug | INTRAMUSCULAR | Status: DC | PRN
  Administered 2016-08-09: 50 ug via INTRAVENOUS
  Filled 2016-08-09: qty 2

## 2016-08-09 MED ORDER — TRIAMCINOLONE ACETONIDE 40 MG/ML IJ SUSP
40.0000 mg | Freq: Once | INTRAMUSCULAR | Status: AC
  Administered 2016-08-09: 40 mg

## 2016-08-09 MED ORDER — ROPIVACAINE HCL 2 MG/ML IJ SOLN
9.0000 mL | Freq: Once | INTRAMUSCULAR | Status: AC
  Administered 2016-08-09: 9 mL via PERINEURAL
  Filled 2016-08-09: qty 10

## 2016-08-09 MED ORDER — TRIAMCINOLONE ACETONIDE 40 MG/ML IJ SUSP
40.0000 mg | Freq: Once | INTRAMUSCULAR | Status: AC
Start: 2016-08-09 — End: 2016-08-09
  Administered 2016-08-09: 40 mg
  Filled 2016-08-09: qty 1

## 2016-08-09 MED ORDER — LACTATED RINGERS IV SOLN
1000.0000 mL | Freq: Once | INTRAVENOUS | Status: AC
  Administered 2016-08-09: 1000 mL via INTRAVENOUS

## 2016-08-09 MED ORDER — LIDOCAINE HCL (PF) 1 % IJ SOLN
10.0000 mL | Freq: Once | INTRAMUSCULAR | Status: AC
  Administered 2016-08-09: 10 mL

## 2016-08-09 NOTE — Progress Notes (Signed)
Patient's Name: Erica [name]  MRN: [id number]  Referring Provider: Delano Metz, MD  DOB: Jul 29, 1965  PCP: [facility]  DOS: 08/09/2016  Note by: Sydnee Levans. Laban Emperor, MD  Service setting: Ambulatory outpatient  Location: [facility] (AMB) Pain Management Facility  Visit type: Procedure  Specialty: Interventional Pain Management  Patient type: Established   Primary Reason for Visit: Interventional Pain Management Treatment. CC: Back Pain (low)  Procedure:  Anesthesia, Analgesia, Anxiolysis:  Type: Diagnostic Medial Branch Facet Block Region: Lumbar Level: L2, L3, L4, L5, & S1 Medial Branch Level(s) Laterality: Bilateral  Type: Local Anesthesia with Moderate (Conscious) Sedation Local Anesthetic: Lidocaine 1% Route: Intravenous (IV) IV Access: Secured Sedation: Meaningful verbal contact was maintained at all times during the procedure  Indication(s): Analgesia and Anxiety  Indications: 1. Lumbar facet syndrome (Bilateral) (R>L)    Pain Score: Pre-procedure: 3 /10 Post-procedure: 0-No pain/10  Pre-op Assessment:  Previous date of service:   Service provided: Med Refill, Evaluation Erica [name] is a 51 y.o. (year old), female patient, seen today for interventional treatment. She  has a past surgical history that includes Knee surgery (Left) and Flexible bronchoscopy (N/A, 06/20/2015). Her primarily concern today is the Back Pain (low)  Initial Vital Signs: Blood pressure 135/62, pulse 76, temperature 97.9 F (36.6 C), resp. rate 18, height 5\' 8"  (1.727 m), weight 190 lb (86.2 kg), last menstrual period 03/13/2015, SpO2 97 %. BMI: 28.89 kg/m  Risk Assessment: Allergies: Reviewed. She is allergic to tramadol and penicillins. Allergy Precautions: None required Coagulopathies: "Reviewed. None identified.  Blood-thinner therapy: None at this time Active Infection(s): Reviewed. None identified. Erica [name] is afebrile  Site Confirmation: Erica [name] was asked to  confirm the procedure and laterality before marking the site Procedure checklist: Completed Consent: Before the procedure and under the influence of no sedative(s), amnesic(s), or anxiolytics, the patient was informed of the treatment options, risks and possible complications. To fulfill our ethical and legal obligations, as recommended by the American Medical Association's Code of Ethics, I have informed the patient of my clinical impression; the nature and purpose of the treatment or procedure; the risks, benefits, and possible complications of the intervention; the alternatives, including doing nothing; the risk(s) and benefit(s) of the alternative treatment(s) or procedure(s); and the risk(s) and benefit(s) of doing nothing. The patient was provided information about the general risks and possible complications associated with the procedure. These may include, but are not limited to: failure to achieve desired goals, infection, bleeding, organ or nerve damage, allergic reactions, paralysis, and death. In addition, the patient was informed of those risks and complications associated to Spine-related procedures, such as failure to decrease pain; infection (i.e.: Meningitis, epidural or intraspinal abscess); bleeding (i.e.: epidural hematoma, subarachnoid hemorrhage, or any other type of intraspinal or peri-dural bleeding); organ or nerve damage (i.e.: Any type of peripheral nerve, nerve root, or spinal cord injury) with subsequent damage to sensory, motor, and/or autonomic systems, resulting in permanent pain, numbness, and/or weakness of one or several areas of the body; allergic reactions; (i.e.: anaphylactic reaction); and/or death. Furthermore, the patient was informed of those risks and complications associated with the medications. These include, but are not limited to: allergic reactions (i.e.: anaphylactic or anaphylactoid reaction(s)); adrenal axis suppression; blood sugar elevation that in diabetics  may result in ketoacidosis or comma; water retention that in patients with history of congestive heart failure may result in shortness of breath, pulmonary edema, and decompensation with resultant heart failure; weight gain; swelling or  edema; medication-induced neural toxicity; particulate matter embolism and blood vessel occlusion with resultant organ, and/or nervous system infarction; and/or aseptic necrosis of one or more joints. Finally, the patient was informed that Medicine is not an exact science; therefore, there is also the possibility of unforeseen or unpredictable risks and/or possible complications that may result in a catastrophic outcome. The patient indicated having understood very clearly. We have given the patient no guarantees and we have made no promises. Enough time was given to the patient to ask questions, all of which were answered to the patient's satisfaction. Erica [name] has indicated that she wanted to continue with the procedure. Attestation: I, the ordering provider, attest that I have discussed with the patient the benefits, risks, side-effects, alternatives, likelihood of achieving goals, and potential problems during recovery for the procedure that I have provided informed consent. Date: 08/09/2016; Time: 9:52 AM  Pre-Procedure Preparation:  Monitoring: As per clinic protocol. Respiration, ETCO2, SpO2, BP, heart rate and rhythm monitor placed and checked for adequate function Safety Precautions: Patient was assessed for positional comfort and pressure points before starting the procedure. Time-out: I initiated and conducted the "Time-out" before starting the procedure, as per protocol. The patient was asked to participate by confirming the accuracy of the "Time Out" information. Verification of the correct person, site, and procedure were performed and confirmed by me, the nursing staff, and the patient. "Time-out" conducted as per Joint Commission's Universal Protocol  (UP.01.01.01). "Time-out" Date & Time: 08/09/2016; 1056 hrs.  Description of Procedure Process:   Position: Prone Target Area: For Lumbar Facet blocks, the target is the groove formed by the junction of the transverse process and superior articular process. For the L5 dorsal ramus, the target is the notch between superior articular process and sacral ala. For the S1 dorsal ramus, the target is the superior and lateral edge of the posterior S1 Sacral foramen. Approach: Paramedial approach. Area Prepped: Entire Posterior Lumbosacral Region Prepping solution: ChloraPrep (2% chlorhexidine gluconate and 70% isopropyl alcohol) Safety Precautions: Aspiration looking for blood return was conducted prior to all injections. At no point did we inject any substances, as a needle was being advanced. No attempts were made at seeking any paresthesias. Safe injection practices and needle disposal techniques used. Medications properly checked for expiration dates. SDV (single dose vial) medications used. Description of the Procedure: Protocol guidelines were followed. The patient was placed in position over the fluoroscopy table. The target area was identified and the area prepped in the usual manner. Skin desensitized using vapocoolant spray. Skin & deeper tissues infiltrated with local anesthetic. Appropriate amount of time allowed to pass for local anesthetics to take effect. The procedure needle was introduced through the skin, ipsilateral to the reported pain, and advanced to the target area. Employing the "Medial Branch Technique", the needles were advanced to the angle made by the superior and medial portion of the transverse process, and the lateral and inferior portion of the superior articulating process of the targeted vertebral bodies. This area is known as "Burton's Eye" or the "Eye of the [place] Dog". A procedure needle was introduced through the skin, and this time advanced to the angle made by the superior  and medial border of the sacral ala, and the lateral border of the S1 vertebral body. This last needle was later repositioned at the superior and lateral border of the posterior S1 foramen. Negative aspiration confirmed. Solution injected in intermittent fashion, asking for systemic symptoms every 0.5cc of injectate. The needles were then  removed and the area cleansed, making sure to leave some of the prepping solution back to take advantage of its long term bactericidal properties. Start Time: 1057 hrs. End Time:   hrs.  Illustration of the posterior view of the lumbar spine and the posterior neural structures. Laminae of L2 through S1 are labeled. DPRL5, dorsal primary ramus of L5; DPRS1, dorsal primary ramus of S1; DPR3, dorsal primary ramus of L3; FJ, facet (zygapophyseal) joint L3-L4; I, inferior articular process of L4; LB1, lateral branch of dorsal primary ramus of L1; IAB, inferior articular branches from L3 medial branch (supplies L4-L5 facet joint); IBP, intermediate branch plexus; MB3, medial branch of dorsal primary ramus of L3; NR3, third lumbar nerve root; S, superior articular process of L5; SAB, superior articular branches from L4 (supplies L4-5 facet joint also); TP3, transverse process of L3.  Materials:  Needle(s) Type: Epidural needle Gauge: 22G Length: 3.5-in Medication(s): We administered fentaNYL, lactated ringers, midazolam, triamcinolone acetonide, lidocaine (PF), ropivacaine (PF) 2 mg/mL (0.2%), ropivacaine (PF) 2 mg/mL (0.2%), and triamcinolone acetonide. Please see chart orders for dosing details.  Imaging Guidance (Spinal):  Type of Imaging Technique: Fluoroscopy Guidance (Spinal) Indication(s): Assistance in needle guidance and placement for procedures requiring needle placement in or near specific anatomical locations not easily accessible without such assistance. Exposure Time: Please see nurses notes. Contrast: None used. Fluoroscopic Guidance: I was personally  present during the use of fluoroscopy. "Tunnel Vision Technique" used to obtain the best possible view of the target area. Parallax error corrected before commencing the procedure. "Direction-depth-direction" technique used to introduce the needle under continuous pulsed fluoroscopy. Once target was reached, antero-posterior, oblique, and lateral fluoroscopic projection used confirm needle placement in all planes. Images permanently stored in EMR. Interpretation: No contrast injected. I personally interpreted the imaging intraoperatively. Adequate needle placement confirmed in multiple planes. Permanent images saved into the patient's record.  Antibiotic Prophylaxis:  Indication(s): None identified Antibiotic given: None  Post-operative Assessment:  EBL: None Complications: No immediate post-treatment complications observed by team, or reported by patient. Note: The patient tolerated the entire procedure well. A repeat set of vitals were taken after the procedure and the patient was kept under observation following institutional policy, for this type of procedure. Post-procedural neurological assessment was performed, showing return to baseline, prior to discharge. The patient was provided with post-procedure discharge instructions, including a section on how to identify potential problems. Should any problems arise concerning this procedure, the patient was given instructions to immediately contact us, at any time, without hesitation. In any case, we plan to contact the patient by telephone for a follow-up status report regarding this interventional procedure. Comments:  No additional relevant information.  Plan of Care  Disposition: Discharge home  Discharge Date & Time: 08/09/2016; 1137 hrs.  Physician-requested Follow-up:  Return in about 2 weeks (around 08/23/2016) for Post-Procedure evaluation.  Future Appointments Date Time Provider Department Center  08/31/2016 1:00 PM Delma Freezeina A Hackney, FNP  [facility] None  09/08/2016 1:30 PM Delano MetzFrancisco Gricelda Foland, MD [facility] None   Medications ordered for procedure: Meds ordered this encounter  Medications  . fentaNYL (SUBLIMAZE) injection 25-50 mcg    Make sure Narcan is available in the pyxis when using this medication. In the event of respiratory depression (RR< 8/min): Titrate NARCAN (naloxone) in increments of 0.1 to 0.2 mg IV at 2-3 minute intervals, until desired degree of reversal.  . lactated ringers infusion 1,000 mL  . midazolam (VERSED) 5 MG/5ML injection 1-2 mg    Make sure  Flumazenil is available in the pyxis when using this medication. If oversedation occurs, administer 0.2 mg IV over 15 sec. If after 45 sec no response, administer 0.2 mg again over 1 min; may repeat at 1 min intervals; not to exceed 4 doses (1 mg)  . triamcinolone acetonide (KENALOG-40) injection 40 mg  . lidocaine (PF) (XYLOCAINE) 1 % injection 10 mL  . ropivacaine (PF) 2 mg/mL (0.2%) (NAROPIN) injection 9 mL  . ropivacaine (PF) 2 mg/mL (0.2%) (NAROPIN) injection 9 mL  . triamcinolone acetonide (KENALOG-40) injection 40 mg   Medications administered: We administered fentaNYL, lactated ringers, midazolam, triamcinolone acetonide, lidocaine (PF), ropivacaine (PF) 2 mg/mL (0.2%), ropivacaine (PF) 2 mg/mL (0.2%), and triamcinolone acetonide.  See the medical record for exact dosing, route, and time of administration.  Lab-work, Procedure(s), & Referral(s) Ordered: Orders Placed This Encounter  Procedures  . DG C-Arm 1-60 Min-No Report  . Discharge instructions  . Follow-up  . Informed Consent Details: Transcribe to consent form and obtain patient signature  . Provider attestation of informed consent for procedure/surgical case  . Verify informed consent   Imaging Ordered: Results for orders placed in visit on 07/05/16  DG C-Arm 1-60 Min-No Report   Narrative There is no Radiologist interpretation  for this exam.   New Prescriptions   No medications on file    Primary Care Physician: [facility] Location: [facility] Outpatient Pain Management Facility Note by: Sydnee Levans. Laban Emperor, M.D, DABA, DABAPM, DABPM, DABIPP, FIPP Date: 08/09/2016; Time: 12:20 PM  Disclaimer:  Medicine is not an Visual merchandiser. The only guarantee in medicine is that nothing is guaranteed. It is important to note that the decision to proceed with this intervention was based on the information collected from the patient. The Data and conclusions were drawn from the patient's questionnaire, the interview, and the physical examination. Because the information was provided in large part by the patient, it cannot be guaranteed that it has not been purposely or unconsciously manipulated. Every effort has been made to obtain as much relevant data as possible for this evaluation. It is important to note that the conclusions that lead to this procedure are derived in large part from the available data. Always take into account that the treatment will also be dependent on availability of resources and existing treatment guidelines, considered by other Pain Management Practitioners as being common knowledge and practice, at the time of the intervention. For Medico-Legal purposes, it is also important to point out that variation in procedural techniques and pharmacological choices are the acceptable norm. The indications, contraindications, technique, and results of the above procedure should only be interpreted and judged by a Board-Certified Interventional Pain Specialist with extensive familiarity and expertise in the same exact procedure and technique. Attempts at providing opinions without similar or greater experience and expertise than that of the treating physician will be considered as inappropriate and unethical, and shall result in a formal complaint to the state medical board and applicable specialty societies.  Instructions provided at this appointment: Patient  Instructions  Pain Management Discharge Instructions  General Discharge Instructions :  If you need to reach your doctor call: Monday-Friday 8:00 am - 4:00 pm at [phone] or toll free [phone].  After clinic hours [phone] to have operator reach doctor.  Bring all of your medication bottles to all your appointments in the pain clinic.  To cancel or reschedule your appointment with Pain Management please remember to call 24 hours in advance to avoid a fee.  Refer to the educational materials which you have been given on: General Risks, I had my Procedure. Discharge Instructions, Post Sedation.  Post Procedure Instructions:  The drugs you were given will stay in your system until tomorrow, so for the next 24 hours you should not drive, make any legal decisions or drink any alcoholic beverages.  You may eat anything you prefer, but it is better to start with liquids then soups and crackers, and gradually work up to solid foods.  Please notify your doctor immediately if you have any unusual bleeding, trouble breathing or pain that is not related to your normal pain.  Depending on the type of procedure that was done, some parts of your body may feel week and/or numb.  This usually clears up by tonight or the next day.  Walk with the use of an assistive device or accompanied by an adult for the 24 hours.  You may use ice on the affected area for the first 24 hours.  Put ice in a Ziploc bag and cover with a towel and place against area 15 minutes on 15 minutes off.  You may switch to heat after 24 hours.Facet Joint Block The facet joints connect the bones of the spine (vertebrae). They make it possible for you to bend, twist, and make other movements with your spine. They also prevent you from overbending, overtwisting, and making other excessive movements.  A facet joint block is a procedure where a numbing medicine (anesthetic) is injected into a facet joint. Often, a type of  anti-inflammatory medicine called a steroid is also injected. A facet joint block may be done for two reasons:   Diagnosis. A facet joint block may be done as a test to see whether neck or back pain is caused by a worn-down or infected facet joint. If the pain gets better after a facet joint block, it means the pain is probably coming from the facet joint. If the pain does not get better, it means the pain is probably not coming from the facet joint.   Therapy. A facet joint block may be done to relieve neck or back pain caused by a facet joint. A facet joint block is only done as a therapy if the pain does not improve with medicine, exercise programs, physical therapy, and other forms of pain management. LET [facility] CARE PROVIDER KNOW ABOUT:   Any allergies you have.   All medicines you are taking, including vitamins, herbs, eyedrops, and over-the-counter medicines and creams.   Previous problems you or members of your family have had with the use of anesthetics.   Any blood disorders you have had.   Other health problems you have. RISKS AND COMPLICATIONS Generally, having a facet joint block is safe. However, as with any procedure, complications can occur. Possible complications associated with having a facet joint block include:   Bleeding.   Injury to a nerve near the injection site.   Pain at the injection site.   Weakness or numbness in areas controlled by nerves near the injection site.   Infection.   Temporary fluid retention.   Allergic reaction to anesthetics or medicines used during the procedure. BEFORE THE PROCEDURE   Follow your health care provider's instructions if you are taking dietary supplements or medicines. You may need to stop taking them or reduce your dosage.   Do not take any new dietary supplements or medicines without asking your health care provider first.   Follow your health care provider's  instructions about eating and drinking  before the procedure. You may need to stop eating and drinking several hours before the procedure.   Arrange to have an adult drive you home after the procedure. PROCEDURE  You may need to remove your clothing and dress in an open-back gown so that your health care provider can access your spine.   The procedure will be done while you are lying on an X-ray table. Most of the time you will be asked to lie on your stomach, but you may be asked to lie in a different position if an injection will be made in your neck.   Special machines will be used to monitor your oxygen levels, heart rate, and blood pressure.   If an injection will be made in your neck, an intravenous (IV) tube will be inserted into one of your veins. Fluids and medicine will flow directly into your body through the IV tube.   The area over the facet joint where the injection will be made will be cleaned with an antiseptic soap. The surrounding skin will be covered with sterile drapes.   An anesthetic will be applied to your skin to make the injection area numb. You may feel a temporary stinging or burning sensation.   A video X-ray machine will be used to locate the joint. A contrast dye may be injected into the facet joint area to help with locating the joint.   When the joint is located, an anesthetic medicine will be injected into the joint through the needle.   Your health care provider will ask you whether you feel pain relief. If you do feel relief, a steroid may be injected to provide pain relief for a longer period of time. If you do not feel relief or feel only partial relief, additional injections of an anesthetic may be made in other facet joints.   The needle will be removed, the skin will be cleansed, and bandages will be applied.  AFTER THE PROCEDURE   You will be observed for 15-30 minutes before being allowed to go home. Do not drive. Have an adult drive you or take a taxi or public transportation  instead.   If you feel pain relief, the pain will return in several hours or days when the anesthetic wears off.   You may feel pain relief 2-14 days after the procedure. The amount of time this relief lasts varies from person to person.   It is normal to feel some tenderness over the injected area(s) for 2 days following the procedure.   If you have diabetes, you may have a temporary increase in blood sugar. This information is not intended to replace advice given to you by your health care provider. Make sure you discuss any questions you have with your health care provider. Document Released: 11/24/2006 Document Revised: 07/26/2014 Document Reviewed: 03/31/2015 Elsevier Interactive Patient Education  2017 [employer] Facet Joint Block, Care After Refer to this sheet in the next few weeks. These instructions provide you with information on caring for yourself after your procedure. Your health care provider may also give you more specific instructions. Your treatment has been planned according to current medical practices, but problems sometimes occur. Call your health care provider if you have any problems or questions after your procedure. HOME CARE INSTRUCTIONS   Keep track of the amount of pain relief you feel and how long it lasts.  Limit pain medicine within the first 4-6 hours after the procedure as  directed by your health care provider.  Resume taking dietary supplements and medicines as directed by your health care provider.  You may resume your regular diet.  Do not apply heat near or over the injection site(s) for 24 hours.   Do not take a bath or soak in water (such as a pool or lake) for 24 hours.  Do not drive for 24 hours unless approved by your health care provider.  Avoid strenuous activity for 24 hours.  Remove your bandages the morning after the procedure.   If the injection site is tender, applying an ice pack may relieve some tenderness. To do  this:  Put ice in a bag.  Place a towel between your skin and the bag.  Leave the ice on for 15-20 minutes, 3-4 times a day.  Keep follow-up appointments as directed by your health care provider. SEEK MEDICAL CARE IF:   Your pain is not controlled by your medicines.   There is drainage from the injection site.   There is significant bleeding or swelling at the injection site.  You have diabetes and your blood sugar is above 180 mg/dL. SEEK IMMEDIATE MEDICAL CARE IF:   You develop a fever of 101F (38.3C) or greater.   You have worsening pain or swelling around the injection site.   You have red streaking around the injection site.   You develop severe pain that is not controlled by your medicines.   You develop a headache, stiff neck, nausea, or vomiting.   Your eyes become very sensitive to light.   You have weakness, paralysis, or tingling in your arms or legs that was not present before the procedure.   You develop difficulty urinating or breathing.  This information is not intended to replace advice given to you by your health care provider. Make sure you discuss any questions you have with your health care provider. Document Released: 06/21/2012 Document Revised: 07/26/2014 Document Reviewed: 03/31/2015 Elsevier Interactive Patient Education  2017 [employer].

## 2016-08-09 NOTE — Patient Instructions (Signed)
Pain Management Discharge Instructions  General Discharge Instructions :  If you need to reach your doctor call: Monday-Friday 8:00 am - 4:00 pm at [phone] or toll free [phone].  After clinic hours [phone] to have operator reach doctor.  Bring all of your medication bottles to all your appointments in the pain clinic.  To cancel or reschedule your appointment with Pain Management please remember to call 24 hours in advance to avoid a fee.  Refer to the educational materials which you have been given on: General Risks, I had my Procedure. Discharge Instructions, Post Sedation.  Post Procedure Instructions:  The drugs you were given will stay in your system until tomorrow, so for the next 24 hours you should not drive, make any legal decisions or drink any alcoholic beverages.  You may eat anything you prefer, but it is better to start with liquids then soups and crackers, and gradually work up to solid foods.  Please notify your doctor immediately if you have any unusual bleeding, trouble breathing or pain that is not related to your normal pain.  Depending on the type of procedure that was done, some parts of your body may feel week and/or numb.  This usually clears up by tonight or the next day.  Walk with the use of an assistive device or accompanied by an adult for the 24 hours.  You may use ice on the affected area for the first 24 hours.  Put ice in a Ziploc bag and cover with a towel and place against area 15 minutes on 15 minutes off.  You may switch to heat after 24 hours.Facet Joint Block The facet joints connect the bones of the spine (vertebrae). They make it possible for you to bend, twist, and make other movements with your spine. They also prevent you from overbending, overtwisting, and making other excessive movements.  A facet joint block is a procedure where a numbing medicine (anesthetic) is injected into a facet joint. Often, a type of anti-inflammatory  medicine called a steroid is also injected. A facet joint block may be done for two reasons:   Diagnosis. A facet joint block may be done as a test to see whether neck or back pain is caused by a worn-down or infected facet joint. If the pain gets better after a facet joint block, it means the pain is probably coming from the facet joint. If the pain does not get better, it means the pain is probably not coming from the facet joint.   Therapy. A facet joint block may be done to relieve neck or back pain caused by a facet joint. A facet joint block is only done as a therapy if the pain does not improve with medicine, exercise programs, physical therapy, and other forms of pain management. LET YOUR HEALTH CARE PROVIDER KNOW ABOUT:   Any allergies you have.   All medicines you are taking, including vitamins, herbs, eyedrops, and over-the-counter medicines and creams.   Previous problems you or members of your family have had with the use of anesthetics.   Any blood disorders you have had.   Other health problems you have. RISKS AND COMPLICATIONS Generally, having a facet joint block is safe. However, as with any procedure, complications can occur. Possible complications associated with having a facet joint block include:   Bleeding.   Injury to a nerve near the injection site.   Pain at the injection site.   Weakness or numbness in areas controlled by nerves near   the injection site.   Infection.   Temporary fluid retention.   Allergic reaction to anesthetics or medicines used during the procedure. BEFORE THE PROCEDURE   Follow your health care provider's instructions if you are taking dietary supplements or medicines. You may need to stop taking them or reduce your dosage.   Do not take any new dietary supplements or medicines without asking your health care provider first.   Follow your health care provider's instructions about eating and drinking before the  procedure. You may need to stop eating and drinking several hours before the procedure.   Arrange to have an adult drive you home after the procedure. PROCEDURE  You may need to remove your clothing and dress in an open-back gown so that your health care provider can access your spine.   The procedure will be done while you are lying on an X-ray table. Most of the time you will be asked to lie on your stomach, but you may be asked to lie in a different position if an injection will be made in your neck.   Special machines will be used to monitor your oxygen levels, heart rate, and blood pressure.   If an injection will be made in your neck, an intravenous (IV) tube will be inserted into one of your veins. Fluids and medicine will flow directly into your body through the IV tube.   The area over the facet joint where the injection will be made will be cleaned with an antiseptic soap. The surrounding skin will be covered with sterile drapes.   An anesthetic will be applied to your skin to make the injection area numb. You may feel a temporary stinging or burning sensation.   A video X-ray machine will be used to locate the joint. A contrast dye may be injected into the facet joint area to help with locating the joint.   When the joint is located, an anesthetic medicine will be injected into the joint through the needle.   Your health care provider will ask you whether you feel pain relief. If you do feel relief, a steroid may be injected to provide pain relief for a longer period of time. If you do not feel relief or feel only partial relief, additional injections of an anesthetic may be made in other facet joints.   The needle will be removed, the skin will be cleansed, and bandages will be applied.  AFTER THE PROCEDURE   You will be observed for 15-30 minutes before being allowed to go home. Do not drive. Have an adult drive you or take a taxi or public transportation instead.    If you feel pain relief, the pain will return in several hours or days when the anesthetic wears off.   You may feel pain relief 2-14 days after the procedure. The amount of time this relief lasts varies from person to person.   It is normal to feel some tenderness over the injected area(s) for 2 days following the procedure.   If you have diabetes, you may have a temporary increase in blood sugar. This information is not intended to replace advice given to you by your health care provider. Make sure you discuss any questions you have with your health care provider. Document Released: 11/24/2006 Document Revised: 07/26/2014 Document Reviewed: 03/31/2015 Elsevier Interactive Patient Education  2017 [employer] Facet Joint Block, Care After Refer to this sheet in the next few weeks. These instructions provide you with information on caring   for yourself after your procedure. Your health care provider may also give you more specific instructions. Your treatment has been planned according to current medical practices, but problems sometimes occur. Call your health care provider if you have any problems or questions after your procedure. HOME CARE INSTRUCTIONS   Keep track of the amount of pain relief you feel and how long it lasts.  Limit pain medicine within the first 4-6 hours after the procedure as directed by your health care provider.  Resume taking dietary supplements and medicines as directed by your health care provider.  You may resume your regular diet.  Do not apply heat near or over the injection site(s) for 24 hours.   Do not take a bath or soak in water (such as a pool or lake) for 24 hours.  Do not drive for 24 hours unless approved by your health care provider.  Avoid strenuous activity for 24 hours.  Remove your bandages the morning after the procedure.   If the injection site is tender, applying an ice pack may relieve some tenderness. To do this:  Put  ice in a bag.  Place a towel between your skin and the bag.  Leave the ice on for 15-20 minutes, 3-4 times a day.  Keep follow-up appointments as directed by your health care provider. SEEK MEDICAL CARE IF:   Your pain is not controlled by your medicines.   There is drainage from the injection site.   There is significant bleeding or swelling at the injection site.  You have diabetes and your blood sugar is above 180 mg/dL. SEEK IMMEDIATE MEDICAL CARE IF:   You develop a fever of 101F (38.3C) or greater.   You have worsening pain or swelling around the injection site.   You have red streaking around the injection site.   You develop severe pain that is not controlled by your medicines.   You develop a headache, stiff neck, nausea, or vomiting.   Your eyes become very sensitive to light.   You have weakness, paralysis, or tingling in your arms or legs that was not present before the procedure.   You develop difficulty urinating or breathing.  This information is not intended to replace advice given to you by your health care provider. Make sure you discuss any questions you have with your health care provider. Document Released: 06/21/2012 Document Revised: 07/26/2014 Document Reviewed: 03/31/2015 Elsevier Interactive Patient Education  2017 [employer]  

## 2016-08-10 ENCOUNTER — Telehealth: Payer: Self-pay | Admitting: *Deleted

## 2016-08-10 NOTE — Telephone Encounter (Signed)
Voicemail left for patient to call our office if there are questions or concerns re; procedure on yesterday.  

## 2016-08-31 ENCOUNTER — Encounter: Payer: Self-pay | Admitting: Family

## 2016-08-31 ENCOUNTER — Ambulatory Visit: Payer: Medicaid Other | Attending: Pain Medicine | Admitting: Pain Medicine

## 2016-08-31 ENCOUNTER — Encounter: Payer: Self-pay | Admitting: Pain Medicine

## 2016-08-31 ENCOUNTER — Ambulatory Visit: Payer: Medicaid Other | Admitting: Pain Medicine

## 2016-08-31 ENCOUNTER — Ambulatory Visit: Payer: Medicaid Other | Attending: Family | Admitting: Family

## 2016-08-31 ENCOUNTER — Other Ambulatory Visit: Payer: Self-pay | Admitting: Pain Medicine

## 2016-08-31 VITALS — BP 168/95 | HR 86 | Temp 98.6°F | Resp 16 | Ht 68.0 in | Wt 190.0 lb

## 2016-08-31 VITALS — BP 156/77 | HR 73 | Resp 18 | Ht 68.0 in | Wt 199.2 lb

## 2016-08-31 DIAGNOSIS — M25559 Pain in unspecified hip: Secondary | ICD-10-CM | POA: Diagnosis not present

## 2016-08-31 DIAGNOSIS — Z8711 Personal history of peptic ulcer disease: Secondary | ICD-10-CM | POA: Insufficient documentation

## 2016-08-31 DIAGNOSIS — R072 Precordial pain: Secondary | ICD-10-CM

## 2016-08-31 DIAGNOSIS — Z801 Family history of malignant neoplasm of trachea, bronchus and lung: Secondary | ICD-10-CM | POA: Insufficient documentation

## 2016-08-31 DIAGNOSIS — M25561 Pain in right knee: Secondary | ICD-10-CM | POA: Insufficient documentation

## 2016-08-31 DIAGNOSIS — Z9889 Other specified postprocedural states: Secondary | ICD-10-CM | POA: Insufficient documentation

## 2016-08-31 DIAGNOSIS — E559 Vitamin D deficiency, unspecified: Secondary | ICD-10-CM | POA: Insufficient documentation

## 2016-08-31 DIAGNOSIS — K089 Disorder of teeth and supporting structures, unspecified: Secondary | ICD-10-CM

## 2016-08-31 DIAGNOSIS — E785 Hyperlipidemia, unspecified: Secondary | ICD-10-CM | POA: Insufficient documentation

## 2016-08-31 DIAGNOSIS — N289 Disorder of kidney and ureter, unspecified: Secondary | ICD-10-CM | POA: Insufficient documentation

## 2016-08-31 DIAGNOSIS — Z8719 Personal history of other diseases of the digestive system: Secondary | ICD-10-CM | POA: Diagnosis not present

## 2016-08-31 DIAGNOSIS — M25552 Pain in left hip: Secondary | ICD-10-CM | POA: Diagnosis not present

## 2016-08-31 DIAGNOSIS — G894 Chronic pain syndrome: Secondary | ICD-10-CM | POA: Insufficient documentation

## 2016-08-31 DIAGNOSIS — Z833 Family history of diabetes mellitus: Secondary | ICD-10-CM | POA: Insufficient documentation

## 2016-08-31 DIAGNOSIS — Z888 Allergy status to other drugs, medicaments and biological substances status: Secondary | ICD-10-CM | POA: Insufficient documentation

## 2016-08-31 DIAGNOSIS — M545 Low back pain: Secondary | ICD-10-CM | POA: Insufficient documentation

## 2016-08-31 DIAGNOSIS — Z885 Allergy status to narcotic agent status: Secondary | ICD-10-CM | POA: Diagnosis not present

## 2016-08-31 DIAGNOSIS — Z79891 Long term (current) use of opiate analgesic: Secondary | ICD-10-CM | POA: Diagnosis not present

## 2016-08-31 DIAGNOSIS — I110 Hypertensive heart disease with heart failure: Secondary | ICD-10-CM | POA: Insufficient documentation

## 2016-08-31 DIAGNOSIS — Z880 Allergy status to penicillin: Secondary | ICD-10-CM | POA: Insufficient documentation

## 2016-08-31 DIAGNOSIS — M1288 Other specific arthropathies, not elsewhere classified, other specified site: Secondary | ICD-10-CM | POA: Diagnosis not present

## 2016-08-31 DIAGNOSIS — J441 Chronic obstructive pulmonary disease with (acute) exacerbation: Secondary | ICD-10-CM | POA: Diagnosis not present

## 2016-08-31 DIAGNOSIS — G8929 Other chronic pain: Secondary | ICD-10-CM | POA: Diagnosis not present

## 2016-08-31 DIAGNOSIS — M47816 Spondylosis without myelopathy or radiculopathy, lumbar region: Secondary | ICD-10-CM

## 2016-08-31 DIAGNOSIS — G4733 Obstructive sleep apnea (adult) (pediatric): Secondary | ICD-10-CM | POA: Insufficient documentation

## 2016-08-31 DIAGNOSIS — J209 Acute bronchitis, unspecified: Secondary | ICD-10-CM | POA: Insufficient documentation

## 2016-08-31 DIAGNOSIS — M1990 Unspecified osteoarthritis, unspecified site: Secondary | ICD-10-CM | POA: Insufficient documentation

## 2016-08-31 DIAGNOSIS — M25562 Pain in left knee: Secondary | ICD-10-CM | POA: Insufficient documentation

## 2016-08-31 DIAGNOSIS — Z87891 Personal history of nicotine dependence: Secondary | ICD-10-CM | POA: Insufficient documentation

## 2016-08-31 DIAGNOSIS — Z7982 Long term (current) use of aspirin: Secondary | ICD-10-CM | POA: Diagnosis not present

## 2016-08-31 DIAGNOSIS — F1190 Opioid use, unspecified, uncomplicated: Secondary | ICD-10-CM | POA: Diagnosis not present

## 2016-08-31 DIAGNOSIS — I272 Pulmonary hypertension, unspecified: Secondary | ICD-10-CM | POA: Insufficient documentation

## 2016-08-31 DIAGNOSIS — K0889 Other specified disorders of teeth and supporting structures: Secondary | ICD-10-CM | POA: Insufficient documentation

## 2016-08-31 DIAGNOSIS — Z803 Family history of malignant neoplasm of breast: Secondary | ICD-10-CM | POA: Diagnosis not present

## 2016-08-31 DIAGNOSIS — Z720 Tobacco use: Secondary | ICD-10-CM | POA: Diagnosis not present

## 2016-08-31 DIAGNOSIS — M25551 Pain in right hip: Secondary | ICD-10-CM | POA: Insufficient documentation

## 2016-08-31 DIAGNOSIS — I5032 Chronic diastolic (congestive) heart failure: Secondary | ICD-10-CM

## 2016-08-31 DIAGNOSIS — J440 Chronic obstructive pulmonary disease with acute lower respiratory infection: Secondary | ICD-10-CM | POA: Insufficient documentation

## 2016-08-31 MED ORDER — HYDROCODONE-ACETAMINOPHEN 5-325 MG PO TABS: 1.0000 | ORAL_TABLET | Freq: Two times a day (BID) | ORAL | 0 refills | Status: DC | PRN

## 2016-08-31 NOTE — Progress Notes (Signed)
Nursing Pain Medication Assessment:  Safety precautions to be maintained throughout the outpatient stay will include: orient to surroundings, keep bed in low position, maintain call bell within reach at all times, provide assistance with transfer out of bed and ambulation.  Medication Inspection Compliance: Pill count conducted under aseptic conditions, in front of the patient. Neither the pills nor the bottle was removed from the patient's sight at any time. Once count was completed pills were immediately returned to the patient in their original bottle.  Medication: Hydrocodone/APAP Pill/Patch Count: 13 of 120 pills remain Bottle Appearance: Standard pharmacy container. Clearly labeled. Filled Date: 01 / 16/ 2018 Last Medication intake:  Today

## 2016-08-31 NOTE — Progress Notes (Signed)
Patient's Name: Erica [name]  MRN: [id number]  Referring Provider: [facility]  DOB: 26-Sep-1965  PCP: [place]  DOS: 08/31/2016  Note by: Kathlen Brunswick. Dossie Arbour, MD  Service setting: Ambulatory outpatient  Specialty: Interventional Pain Management  Location: [facility] (AMB) Pain Management Facility    Patient type: Established   Primary Reason(s) for Visit: Encounter for prescription drug management & post-procedure evaluation of chronic illness with mild to moderate exacerbation(Level of risk: moderate) CC: Knee Pain (both) and Back Pain (upper between shoulder blades)  HPI  Ms. Stach is a 51 y.o. year old, female patient, who comes today for a post-procedure evaluation and medication management. She has Chest pain; Hyponatremia; Shortness of breath; Swelling; Cough; COPD (chronic obstructive pulmonary disease) with acute bronchitis ([place]); Renal insufficiency; Pulmonary HTN; COPD, mild ([place]); Chronic diastolic heart failure ([place]); Smoker; Obstructive sleep apnea; Tachycardia; Depression; Long term current use of opiate analgesic; Long term prescription opiate use; Opiate use; Chronic low back pain (Location of Primary Source of Pain) (Bilateral) (L>R); Chronic hip pain (Location of Secondary source of pain) (Bilateral) (L>R); Chronic knee pain (Location of Tertiary source of pain) (Bilateral) (L>R); Chronic neck pain (Bilateral) (R>L); Chronic upper back pain (midline); Chronic foot pain (bottom of feet) (Bilateral) (R>L); Chronic hand pain (Bilateral); Peripheral neuropathy, idiopathic (upper and lower extremity); Chronic sacroiliac joint pain (Bilateral) (R>L); Lumbar facet syndrome (Bilateral) (R>L); Chronic pain syndrome; Elevated sedimentation rate; Elevated C-reactive protein (CRP); Neurogenic pain; Vitamin D deficiency; and Lumbar spondylosis on her problem list. Her primarily concern today is the Knee Pain (both) and Back Pain (upper between shoulder  blades)  Pain Assessment: Self-Reported Pain Score: 1 /10             Reported level is compatible with observation.       Pain Type: Chronic pain Pain Location: Knee Pain Orientation: Right, Left Pain Descriptors / Indicators: Aching, Constant, Sharp, Penetrating Pain Frequency: Constant  Ms. Litzinger was last seen on 08/09/2016 for a procedure. During today's appointment we reviewed Ms. Omdahl's post-procedure results, as well as her outpatient medication regimen.  Further details on both, my assessment(s), as well as the proposed treatment plan, please see below.  Controlled Substance Pharmacotherapy Assessment REMS (Risk Evaluation and Mitigation Strategy)  Analgesic:Hydrocodone/APAP 5/325 one tablet every 6 hours (20 mg/day) MME/day:40m/day PEvon Slack RN  08/31/2016 10:53 AM  Sign at close encounter Nursing Pain Medication Assessment:  Safety precautions to be maintained throughout the outpatient stay will include: orient to surroundings, keep bed in low position, maintain call bell within reach at all times, provide assistance with transfer out of bed and ambulation.  Medication Inspection Compliance: Pill count conducted under aseptic conditions, in front of the patient. Neither the pills nor the bottle was removed from the patient's sight at any time. Once count was completed pills were immediately returned to the patient in their original bottle.  Medication: Hydrocodone/APAP Pill/Patch Count: 13 of 120 pills remain Bottle Appearance: Standard pharmacy container. Clearly labeled. Filled Date: 01 / 16/ 2018 Last Medication intake:  Today   Pharmacokinetics: Liberation and absorption (onset of action): WNL Distribution (time to peak effect): WNL Metabolism and excretion (duration of action): WNL         Pharmacodynamics: Desired effects: Analgesia: Ms. BLutzreports >50% benefit. Functional ability: Patient reports that medication allows her to accomplish basic  ADLs Clinically meaningful improvement in function (CMIF): Sustained CMIF goals met Perceived effectiveness: Described as relatively effective, allowing for increase in  activities of daily living (ADL) Undesirable effects: Side-effects or Adverse reactions: None reported Monitoring: [place] PMP: Online review of the past 49-monthperiod conducted. Compliant with practice rules and regulations List of all UDS test(s) done:  Lab Results  Component Value Date   SUMMARY FINAL 04/29/2016   Last UDS on record: No results found for: TOXASSSELUR UDS interpretation: Compliant          Medication Assessment Form: Reviewed. Patient indicates being compliant with therapy Treatment compliance: Compliant Risk Assessment Profile: Aberrant behavior: See prior evaluations. None observed or detected today Comorbid factors increasing risk of overdose: See prior notes. No additional risks detected today Risk of substance use disorder (SUD): Low Opioid Risk Tool (ORT) Total Score: 1  Interpretation Table:  Score <3 = Low Risk for SUD  Score between 4-7 = Moderate Risk for SUD  Score >8 = High Risk for Opioid Abuse   Risk Mitigation Strategies:  Patient Counseling: Covered Patient-Prescriber Agreement (PPA): Present and active  Notification to other healthcare providers: Done  Pharmacologic Plan: No change in therapy, at this time  Post-Procedure Assessment  08/09/2016 Procedure: Diagnostic bilateral lumbar facet block #2 under fluoro and IV sedation. Post-procedure pain score: 0/10 (100% relief) Influential Factors: BMI: 28.89 kg/m Intra-procedural challenges: None observed Assessment challenges: None detected         Post-procedural side-effects, adverse reactions, or complications: None reported Reported issues: None  Sedation: Sedation provided. When no sedatives are used, the analgesic levels obtained are directly associated to the effectiveness of the local anesthetics. However, when sedation  is provided, the level of analgesia obtained during the initial 1 hour following the intervention, is believed to be the result of a combination of factors. These factors may include, but are not limited to: 1. The effectiveness of the local anesthetics used. 2. The effects of the analgesic(s) and/or anxiolytic(s) used. 3. The degree of discomfort experienced by the patient at the time of the procedure. 4. The patients ability and reliability in recalling and recording the events. 5. The presence and influence of possible secondary gains and/or psychosocial factors. Reported result: Relief experienced during the 1st hour after the procedure: 100 % (Ultra-Short Term Relief) Interpretative annotation: Analgesia during this period is likely to be Local Anesthetic and/or IV Sedative (Analgesic/Anxiolitic) related.          Effects of local anesthetic: The analgesic effects attained during this period are directly associated to the localized infiltration of local anesthetics and therefore cary significant diagnostic value as to the etiological location, or anatomical origin, of the pain. Expected duration of relief is directly dependent on the pharmacodynamics of the local anesthetic used. Long-acting (4-6 hours) anesthetics used.  Reported result: Relief during the next 4 to 6 hour after the procedure: 100 % (Short-Term Relief) Interpretative annotation: Complete relief would suggest area to be the source of the pain.          Long-term benefit: Defined as the period of time past the expected duration of local anesthetics. With the possible exception of prolonged sympathetic blockade from the local anesthetics, benefits during this period are typically attributed to, or associated with, other factors such as analgesic sensory neuropraxia, antiinflammatory effects, or beneficial biochemical changes provided by agents other than the local anesthetics Reported result: Extended relief following procedure: 100  % (Long-Term Relief) Interpretative annotation: Good relief. This could suggest inflammation to be a significant component in the etiology to the pain.          Current benefits:  Defined as persistent relief that continues at this point in time.   Reported results: Treated area: 100 % Ms. Berrie reports improvement in function Interpretative annotation: Ongoing benefits would suggest effective therapeutic approach  Interpretation: Results would suggest a successful diagnostic intervention. The patient has failed to respond to conservative therapies including over-the-counter medications, anti-inflammatories, muscle relaxants, membrane stabilizers, opioids, physical therapy, modalities such as heat and ice, as well as more invasive techniques such as nerve blocks. Because Ms. Moncur did attain more than 50% relief of the pain during a series of diagnostic blocks conducted in separate occasions, I believe it is medically necessary to proceed with Radiofrequency Ablation, in order to attempt gaining longer relief.  Laboratory Chemistry  Inflammation Markers Lab Results  Component Value Date   ESRSEDRATE 31 (H) 04/30/2016   CRP 1.3 (H) 04/30/2016   Renal Function Lab Results  Component Value Date   BUN 14 06/14/2016   CREATININE 0.95 06/14/2016   GFRAA >60 06/14/2016   GFRNONAA >60 06/14/2016   Hepatic Function Lab Results  Component Value Date   AST 27 06/14/2016   ALT 26 06/14/2016   ALBUMIN 4.2 06/14/2016   Electrolytes Lab Results  Component Value Date   NA 136 06/14/2016   K 3.4 (L) 06/14/2016   CL 101 06/14/2016   CALCIUM 9.4 06/14/2016   MG 1.9 04/30/2016   Pain Modulating Vitamins Lab Results  Component Value Date   25OHVITD1 15 (L) 04/30/2016   25OHVITD2 <1.0 04/30/2016   25OHVITD3 15 04/30/2016   VITAMINB12 248 04/30/2016   Coagulation Parameters Lab Results  Component Value Date   PLT 249 06/14/2016   Cardiovascular Lab Results  Component Value Date    BNP 42.0 06/14/2016   HGB 13.9 06/14/2016   HCT 40.7 06/14/2016   Note: Lab results reviewed.  Recent Diagnostic Imaging Review  Dg C-arm 1-60 Min-no Report  Result Date: 08/09/2016 There is no Radiologist interpretation  for this exam.  Note: Imaging results reviewed.          Meds  The patient has a current medication list which includes the following prescription(s): albuterol, aspirin, atorvastatin, vitamin d3, diltiazem, duloxetine, fluconazole, fluticasone-salmeterol, folic acid, furosemide, hydrocodone-acetaminophen, pantoprazole, potassium chloride sa, pregabalin, sucralfate, and tiotropium.  Current Outpatient Prescriptions on File Prior to Visit  Medication Sig  . albuterol (PROAIR HFA) 108 (90 Base) MCG/ACT inhaler Inhale into the lungs 4 (four) times daily.  Marland Kitchen aspirin EC 81 MG EC tablet Take 1 tablet (81 mg total) by mouth daily.  . Cholecalciferol (VITAMIN D3) 2000 units capsule Take 1 capsule (2,000 Units total) by mouth daily.  Marland Kitchen diltiazem (CARDIZEM) 30 MG tablet Take 1 tablet (30 mg total) by mouth 3 (three) times daily as needed.  . DULoxetine (CYMBALTA) 60 MG capsule Take 60 mg by mouth daily.  . Fluticasone-Salmeterol (ADVAIR DISKUS IN) Inhale into the lungs 2 (two) times daily.  . folic acid (FOLVITE) 1 MG tablet Take 1 mg by mouth daily.  . furosemide (LASIX) 40 MG tablet Take 1 tablet (40 mg total) by mouth daily. Hold if SBP<110 or DBP<60 (Patient taking differently: Take 40 mg by mouth daily. Hold if SBP<110 or DBP<60)  . pantoprazole (PROTONIX) 40 MG tablet Take 1 tablet (40 mg total) by mouth daily.  . potassium chloride SA (K-DUR,KLOR-CON) 20 MEQ tablet Take 20 mEq by mouth 2 (two) times daily.  . pregabalin (LYRICA) 50 MG capsule Take 1 capsule (50 mg total) by mouth 3 (three) times daily.  Marland Kitchen  sucralfate (CARAFATE) 1 G tablet Take 1 tablet (1 g total) by mouth 4 (four) times daily -  with meals and at bedtime.  Marland Kitchen tiotropium (SPIRIVA HANDIHALER) 18 MCG  inhalation capsule Place 18 mcg into inhaler and inhale daily.   No current facility-administered medications on file prior to visit.    ROS  Constitutional: Denies any fever or chills Gastrointestinal: No reported hemesis, hematochezia, vomiting, or acute GI distress Musculoskeletal: Denies any acute onset joint swelling, redness, loss of ROM, or weakness Neurological: No reported episodes of acute onset apraxia, aphasia, dysarthria, agnosia, amnesia, paralysis, loss of coordination, or loss of consciousness  Allergies  Ms. Rebuck is allergic to tramadol and penicillins.  [place]  Drug: Ms. Fishbaugh  reports that she does not use drugs. Alcohol:  reports that she drinks alcohol. Tobacco:  reports that she quit smoking about 14 months ago. Her smoking use included Cigarettes. She has a 36.00 pack-year smoking history. She has never used smokeless tobacco. Medical:  has a past medical history of Arthritis; Asthma; CHF (congestive heart failure) ([place]); COPD (chronic obstructive pulmonary disease) ([place]); Gastric ulcer; Hyperlipidemia; Hypertension; and OSA on CPAP (no ins - no cpap machine for the last 4 years). Family: family history includes Breast cancer in her mother; Diabetes in her maternal grandmother; Lung cancer in her father.  Past Surgical History:  Procedure Laterality Date  . FLEXIBLE BRONCHOSCOPY N/A 06/20/2015   Procedure: FLEXIBLE BRONCHOSCOPY;  Surgeon: Laverle Hobby, MD;  Location: [facility] ORS;  Service: Pulmonary;  Laterality: N/A;  . KNEE SURGERY Left    8 knee surgeries   Constitutional Exam  General appearance: Well nourished, well developed, and well hydrated. In no apparent acute distress Vitals:   08/31/16 1042  BP: (!) 168/95  Pulse: 86  Resp: 16  Temp: 98.6 F (37 C)  TempSrc: Oral  SpO2: 96%  Weight: 190 lb (86.2 kg)  Height: 5' 8"  (1.727 m)   BMI Assessment: Estimated body mass index is 28.89 kg/m as calculated from the following:   Height as of this  encounter: 5' 8"  (1.727 m).   Weight as of this encounter: 190 lb (86.2 kg).  BMI interpretation table: BMI level Category Range association with higher incidence of chronic pain  <18 kg/m2 Underweight   18.5-24.9 kg/m2 Ideal body weight   25-29.9 kg/m2 Overweight Increased incidence by 20%  30-34.9 kg/m2 Obese (Class I) Increased incidence by 68%  35-39.9 kg/m2 Severe obesity (Class II) Increased incidence by 136%  >40 kg/m2 Extreme obesity (Class III) Increased incidence by 254%   BMI Readings from Last 4 Encounters:  08/31/16 30.30 kg/m  08/31/16 28.89 kg/m  08/09/16 28.89 kg/m  07/28/16 29.50 kg/m   Wt Readings from Last 4 Encounters:  08/31/16 199 lb 4 oz (90.4 kg)  08/31/16 190 lb (86.2 kg)  08/09/16 190 lb (86.2 kg)  07/28/16 194 lb (88 kg)  Psych/Mental status: Alert, oriented x 3 (person, place, & time)       Eyes: PERLA Respiratory: No evidence of acute respiratory distress  Cervical Spine Exam  Inspection: No masses, redness, or swelling Alignment: Symmetrical Functional ROM: Unrestricted ROM Stability: No instability detected Muscle strength & Tone: Functionally intact Sensory: Unimpaired Palpation: Non-contributory  Upper Extremity (UE) Exam    Side: Right upper extremity  Side: Left upper extremity  Inspection: No masses, redness, swelling, or asymmetry. No contractures  Inspection: No masses, redness, swelling, or asymmetry. No contractures  Functional ROM: Unrestricted ROM  Functional ROM: Unrestricted ROM          Muscle strength & Tone: Functionally intact  Muscle strength & Tone: Functionally intact  Sensory: Unimpaired  Sensory: Unimpaired  Palpation: Euthermic  Palpation: Euthermic  Specialized Test(s): Deferred         Specialized Test(s): Deferred          Thoracic Spine Exam  Inspection: No masses, redness, or swelling Alignment: Symmetrical Functional ROM: Unrestricted ROM Stability: No instability detected Sensory:  Unimpaired Muscle strength & Tone: Functionally intact Palpation: Non-contributory  Lumbar Spine Exam  Inspection: No masses, redness, or swelling Alignment: Symmetrical Functional ROM: Unrestricted ROM Stability: No instability detected Muscle strength & Tone: Functionally intact Sensory: Unimpaired Palpation: Non-contributory Provocative Tests: Lumbar Hyperextension and rotation test: evaluation deferred today       Patrick's Maneuver: evaluation deferred today              Gait & Posture Assessment  Ambulation: Unassisted Gait: Relatively normal for age and body habitus Posture: WNL   Lower Extremity Exam    Side: Right lower extremity  Side: Left lower extremity  Inspection: No masses, redness, swelling, or asymmetry. No contractures  Inspection: No masses, redness, swelling, or asymmetry. No contractures  Functional ROM: Unrestricted ROM          Functional ROM: Unrestricted ROM          Muscle strength & Tone: Functionally intact  Muscle strength & Tone: Functionally intact  Sensory: Unimpaired  Sensory: Unimpaired  Palpation: No palpable anomalies  Palpation: No palpable anomalies   Assessment  Primary Diagnosis & Pertinent Problem List: The primary encounter diagnosis was Chronic pain syndrome. Diagnoses of Chronic low back pain (Location of Primary Source of Pain) (Bilateral) (L>R), Chronic hip pain, unspecified laterality, Chronic knee pain (Location of Tertiary source of pain) (Bilateral) (L>R), Lumbar facet syndrome (Bilateral) (R>L), Long term current use of opiate analgesic, and Opiate use were also pertinent to this visit.  Status Diagnosis  Controlled Controlled Controlled 1. Chronic pain syndrome   2. Chronic low back pain (Location of Primary Source of Pain) (Bilateral) (L>R)   3. Chronic hip pain, unspecified laterality   4. Chronic knee pain (Location of Tertiary source of pain) (Bilateral) (L>R)   5. Lumbar facet syndrome (Bilateral) (R>L)   6. Long term  current use of opiate analgesic   7. Opiate use      Plan of Care  Pharmacotherapy (Medications Ordered): Meds ordered this encounter  Medications  . HYDROcodone-acetaminophen (NORCO/VICODIN) 5-325 MG tablet    Sig: Take 1 tablet by mouth every 12 (twelve) hours as needed for moderate pain.    Dispense:  60 tablet    Refill:  0    Patient may have prescription filled one day early if pharmacy is closed on scheduled refill date. Do not fill until: 08/31/16 To last until: 09/30/16   New Prescriptions   No medications on file   Medications administered today: Ms. Leeman had no medications administered during this visit. Lab-work, procedure(s), and/or referral(s): Orders Placed This Encounter  Procedures  . KNEE INJECTION  . Radiofrequency,Lumbar   Imaging and/or referral(s): None  Interventional therapies: Planned, scheduled, and/or pending:   Bilateral intra-articular knee injection w/ local & steroids Bilateral lumbar facet radiofrequency ablation (Starting with the left) (on different appointment)   Considering:   Diagnostic bilateral lumbar facet block #3 Possible bilateral lumbar facet radiofrequency ablation  Possible bilateral diagnostic sacroiliac joint block  Possible bilateral intra-articular hip joint injection  Possible bilateral intra-articular knee joint injection Possible bilateral genicular nerve block  Possible bilateral knee joint radiofrequency ablation.  Possible left-sided lumbar epidural steroid injection Possible right-sided cervical epidural steroid injection Possible bilateral diagnostic cervical facet block Possible bilateral diagnostic greater occipital nerve blocks Possible greater occipital nerve radiofrequency ablation.   Palliative PRN treatment(s):   Possible bilateral lumbar facet radiofrequency ablation    Provider-requested follow-up: Return in about 1 month (around 09/28/2016) for in addition, procedure (ASAA).  Future  Appointments Date Time Provider [place]  09/08/2016 1:30 PM Milinda Pointer, MD [facility] None  12/07/2016 1:00 PM Alisa Graff, [place] [facility] None   Primary Care Physician: [place] Location: [facility] Note by: Kathlen Brunswick. Dossie Arbour, M.D, DABA, DABAPM, DABPM, DABIPP, FIPP Date: 08/31/2016; Time: 4:02 PM  Pain Score Disclaimer: We use the NRS-11 scale. This is a self-reported, subjective measurement of pain severity with only modest accuracy. It is used primarily to identify changes within a particular patient. It must be understood that outpatient pain scales are significantly less accurate that those used for research, where they can be applied under ideal controlled circumstances with minimal exposure to variables. In reality, the score is likely to be a combination of pain intensity and pain affect, where pain affect describes the degree of emotional arousal or changes in action readiness caused by the sensory experience of pain. Factors such as social and work situation, setting, emotional state, anxiety levels, expectation, and prior pain experience may influence pain perception and show large inter-individual differences that may also be affected by time variables.  Patient instructions provided during this appointment: Patient Instructions  You were given a prescription for Hydrocodone x 1 today.  You have refills on your Vitamin D to last for a year.  Pre procedure instructions given with teach back 3 done.    Preemptive Analgesia Technique  Definition:   A technique used to minimize the effects of an activity known to cause inflammation or swelling, which in turn leads to an increase in pain.  Purpose: To prevent swelling from occurring. It is based on the fact that it is easier to prevent swelling from happening than it is to get rid of it, once it occurs.  Contraindications: 1. Anyone with allergy or  hypersensitivity to the recommended medications. 2. Anyone taking anticoagulants (Blood Thinners) (e.g., Coumadin, Warfarin, Plavix, etc.). 3. Patients in Renal Failure.  Technique: Before you undertake an activity known to cause pain, or a flare-up of your chronic pain, and before you experience any pain, do the following:  1. On a full stomach, take 4 (four) over the counter Ibuprofens 231m tablets (Motrin), for a total of 800 mg. 2. In addition, take over the counter Magnesium 400 to 500 mg, before doing the activity.  3. Six (6) hours later, again on a full stomach, repeat the Ibuprofen. 4. That night, take a warm shower and stretch under the running warm water.  This technique may be sufficient to abort the pain and discomfort before it happens. Keep in mind that it takes a lot less medication to prevent swelling than it takes to eliminate it once it occurs. Preparing for your procedure (without sedation) Instructions: . Oral Intake: Do not eat or drink anything for at least 3 hours prior to your procedure. . Transportation: Unless otherwise stated by your physician, you may drive yourself after the procedure. . Blood Pressure Medicine: Take your blood pressure medicine with a sip of water the  morning of the procedure. . Insulin: Take only  of your normal insulin dose. . Preventing infections: Shower with an antibacterial soap the morning of your procedure. . Build-up your immune system: Take 1000 mg of Vitamin C with every meal (3 times a day) the day prior to your procedure. . Pregnancy: If you are pregnant, call and cancel the procedure. . Sickness: If you have a cold, fever, or any active infections, call and cancel the procedure. . Arrival: You must be in the facility at least 30 minutes prior to your scheduled procedure. . Children: Do not bring any children with you. . Dress appropriately: Bring dark clothing that you would not mind if they get stained. . Valuables: Do not  bring any jewelry or valuables. Procedure appointments are reserved for interventional treatments only. Marland Kitchen No Prescription Refills. . No medication changes will be discussed during procedure appointments. No disability issues will be discussed.

## 2016-08-31 NOTE — Progress Notes (Signed)
Erica [name] ID: Erica [name], female    DOB: 07-May-1966, 51 y.o.   MRN: [id number]  HPI  Erica [name] is a 51 y/o female with a history of obstructive sleep apnea, HTN, hyperlipidemia, COPD, asthma, pulmonary HTN, chronic tobacco use and chronic heart failure.  Last echo was done on 06/14/15 and showed an EF of 60-65% with moderately elevated PA pressure at 56 mm Hg. Last PFT's were done 04/19/16. Recently had a chest CT done on 04/27/16 due to ground glass infiltrate.   In the ED on 06/14/16 due to HF exacerbation after being out of Erica [name] lasix for previous 5 days. Treated with IV lasix and discharged home. Previously seen in the ER on 10/03/15 with an upper respiratory tract infection. Erica [name] was treated with prednisone taper and released. Last admission was on 06/13/15 due to COPD exacerbation and hyponatremia. Had a bronchoscopy done 06/20/15. Was treated with antibiotics, nebulizer, hypertonic solution and bicarbonate drip with improvement of Erica [name] sodium.   Erica [name] presents today for a follow-up visit with shortness of breath and fatigue upon exertion. Denies any swelling in Erica [name] legs/abdomen. Has chronic dental pain and Erica [name]'s getting ready to have all the lower teeth removed. Has been having some more frequent sternal pain that Erica [name] describes as sharp in nature that quickly resolves on its own. No other symptoms occur along with the pain. Does wear oxygen at 2L at bedtime.   Past Medical History:  Diagnosis Date  . Arthritis   . Asthma   . CHF (congestive heart failure) (HCC)   . COPD (chronic obstructive pulmonary disease) (HCC)   . Gastric ulcer   . Hyperlipidemia   . Hypertension   . OSA on CPAP no ins - no cpap machine for the last 4 years   Past Surgical History:  Procedure Laterality Date  . FLEXIBLE BRONCHOSCOPY N/A 06/20/2015   Procedure: FLEXIBLE BRONCHOSCOPY;  Surgeon: Shane Crutch, MD;  Location: [facility] ORS;  Service: Pulmonary;  Laterality: N/A;  . KNEE SURGERY Left    8 knee  surgeries   Family History  Problem Relation Age of Onset  . Lung cancer Father   . Breast cancer Mother     early 85's  . Diabetes Maternal Grandmother    Social History  Substance Use Topics  . Smoking status: Former Smoker    Packs/day: 1.00    Years: 36.00    Types: Cigarettes    Quit date: 06/13/2015  . Smokeless tobacco: Never Used  . Alcohol use 0.0 oz/week     Comment: occasionaly   Allergies  Allergen Reactions  . Tramadol Other (See Comments)    Heart palpatations  . Penicillins Rash   Prior to Admission medications   Medication Sig Start Date End Date Taking? Authorizing Provider  albuterol (PROAIR HFA) 108 (90 Base) MCG/ACT inhaler Inhale into the lungs 4 (four) times daily.   Yes Historical Provider, MD  aspirin EC 81 MG EC tablet Take 1 tablet (81 mg total) by mouth daily. 06/23/15  Yes Shaune Pollack, MD  atorvastatin (LIPITOR) 40 MG tablet Take 40 mg by mouth daily.   Yes Historical Provider, MD  Cholecalciferol (VITAMIN D3) 2000 units capsule Take 1 capsule (2,000 Units total) by mouth daily. 06/29/16  Yes Delano Metz, MD  diltiazem (CARDIZEM) 30 MG tablet Take 1 tablet (30 mg total) by mouth 3 (three) times daily as needed. 04/22/16  Yes Antonieta Iba, MD  DULoxetine (CYMBALTA) 60 MG capsule Take 60 mg by mouth daily.  Yes Historical Provider, MD  fluconazole (DIFLUCAN) 150 MG tablet take 1 tablet by mouth as a single dose as needed 08/23/16  Yes Historical Provider, MD  Fluticasone-Salmeterol (ADVAIR DISKUS IN) Inhale into the lungs 2 (two) times daily.   Yes Historical Provider, MD  folic acid (FOLVITE) 1 MG tablet Take 1 mg by mouth daily.   Yes Historical Provider, MD  furosemide (LASIX) 40 MG tablet Take 1 tablet (40 mg total) by mouth daily. Hold if SBP<110 or DBP<60 Erica [name] taking differently: Take 40 mg by mouth daily. Hold if SBP<110 or DBP<60 07/16/15  Yes Delma Freeze, FNP  HYDROcodone-acetaminophen (NORCO/VICODIN) 5-325 MG tablet Take 1 tablet  by mouth every 12 (twelve) hours as needed for moderate pain. 08/31/16 09/30/16 Yes Delano Metz, MD  pantoprazole (PROTONIX) 40 MG tablet Take 1 tablet (40 mg total) by mouth daily. 06/23/15  Yes Shaune Pollack, MD  potassium chloride SA (K-DUR,KLOR-CON) 20 MEQ tablet Take 20 mEq by mouth 2 (two) times daily.   Yes Historical Provider, MD  pregabalin (LYRICA) 50 MG capsule Take 1 capsule (50 mg total) by mouth 3 (three) times daily. 06/29/16 08/31/16 Yes Delano Metz, MD  sucralfate (CARAFATE) 1 G tablet Take 1 tablet (1 g total) by mouth 4 (four) times daily -  with meals and at bedtime. 06/23/15  Yes Shaune Pollack, MD  tiotropium (SPIRIVA HANDIHALER) 18 MCG inhalation capsule Place 18 mcg into inhaler and inhale daily.   Yes Historical Provider, MD    Review of Systems  Constitutional: Positive for fatigue. Negative for appetite change.  HENT: Positive for voice change (hoarseness). Negative for congestion, postnasal drip and sore throat.   Eyes: Negative.   Respiratory: Positive for shortness of breath. Negative for cough and chest tightness.   Cardiovascular: Positive for chest pain (at times) and palpitations. Negative for leg swelling.  Gastrointestinal: Negative for abdominal distention and abdominal pain.  Endocrine: Negative.   Genitourinary: Negative.   Musculoskeletal: Positive for arthralgias (both knees) and back pain (mid-back).  Skin: Negative.   Allergic/Immunologic: Negative.   Neurological: Negative for dizziness and light-headedness.  Hematological: Negative for adenopathy. Does not bruise/bleed easily.  Psychiatric/Behavioral: Positive for sleep disturbance (chronic trouble sleeping). Negative for dysphoric mood and suicidal ideas. The Erica [name] is not nervous/anxious.    Vitals:   08/31/16 1203  BP: (!) 156/77  Pulse: 73  Resp: 18  SpO2: 95%  Weight: 199 lb 4 oz (90.4 kg)  Height: 5\' 8"  (1.727 m)   Wt Readings from Last 3 Encounters:  08/31/16 199 lb 4 oz (90.4 kg)   08/31/16 190 lb (86.2 kg)  08/09/16 190 lb (86.2 kg)   Lab Results  Component Value Date   CREATININE 0.95 06/14/2016   CREATININE 0.65 04/22/2016   CREATININE 0.73 06/23/2015    Physical Exam  Constitutional: Erica [name] is oriented to person, place, and time. Erica [name] appears well-developed and well-nourished.  HENT:  Head: Normocephalic and atraumatic.  Eyes: Conjunctivae are normal. Pupils are equal, round, and reactive to light.  Neck: Normal range of motion. Neck supple. No JVD present.  Cardiovascular: Normal rate and regular rhythm.   Pulmonary/Chest: Effort normal. Erica [name] has no wheezes. Erica [name] has no rales.  Abdominal: Soft. Erica [name] exhibits no distension. There is no tenderness.  Musculoskeletal: Erica [name] exhibits no edema or tenderness.  Neurological: Erica [name] is alert and oriented to person, place, and time.  Skin: Skin is warm and dry.  Psychiatric: Erica [name] has a normal mood and affect. Erica [name] behavior is normal. Thought  content normal.  Nursing note and vitals reviewed.     Assessment & Plan:  1: Precordial pain- - occurring more frequently and resolves quickly when it does occur - EKG done today and shows NSR - will call Erica [name]'s cardiologist Mariah Milling([place]) to update and schedule an appointment as Erica [name] doesn't have one currently scheduled - to present to ER if Erica [name] pattern changes or worsens  2: Chronic heart failure with preserved ejection fraction- - NYHA class II today - euvolemic - continue daily weights. Reminded to call for an overnight weight gain of >2 pounds or a weekly weight gain of >5 pounds - received Erica [name] flu vaccine for this season - saw cardiologist Mariah Milling([place]) on 04/22/16  3: Dental pain- - has an appointment scheduled on 09/15/16 to get Erica [name] remaining lower teeth all removed - already has an implant on the upper teeth  4: Pulmonary HTN- - recently had a CT. Erica [name] reports that there's no malignancy - saw pulmonologist Meredeth Ide([place]) on 08/10/16 and returns to him 11/09/16 - wearing oxygent at 2L  at bedtime  5: Tobacco use- - no longer taking chantix and no longer smokes - congratulated Erica [name] and continued cessation discussed for 3 minutes with Erica [name]  Erica [name] did not bring Erica [name] medications nor a list. Each medication was verbally reviewed with the Erica [name] and Erica [name] was encouraged to bring the bottles to every visit to confirm accuracy of list.  Return in 3 months or sooner for any questions/problems before then.

## 2016-08-31 NOTE — Patient Instructions (Signed)
Continue weighing daily and call for an overnight weight gain of > 2 pounds or a weekly weight gain of >5 pounds. 

## 2016-08-31 NOTE — Patient Instructions (Addendum)
You were given a prescription for Hydrocodone x 1 today.  You have refills on your Vitamin D to last for a year.  Pre procedure instructions given with teach back 3 done.    Preemptive Analgesia Technique  Definition:   A technique used to minimize the effects of an activity known to cause inflammation or swelling, which in turn leads to an increase in pain.  Purpose: To prevent swelling from occurring. It is based on the fact that it is easier to prevent swelling from happening than it is to get rid of it, once it occurs.  Contraindications: 1. Anyone with allergy or hypersensitivity to the recommended medications. 2. Anyone taking anticoagulants (Blood Thinners) (e.g., Coumadin, Warfarin, Plavix, etc.). 3. Patients in Renal Failure.  Technique: Before you undertake an activity known to cause pain, or a flare-up of your chronic pain, and before you experience any pain, do the following:  1. On a full stomach, take 4 (four) over the counter Ibuprofens 200mg  tablets (Motrin), for a total of 800 mg. 2. In addition, take over the counter Magnesium 400 to 500 mg, before doing the activity.  3. Six (6) hours later, again on a full stomach, repeat the Ibuprofen. 4. That night, take a warm shower and stretch under the running warm water.  This technique may be sufficient to abort the pain and discomfort before it happens. Keep in mind that it takes a lot less medication to prevent swelling than it takes to eliminate it once it occurs. Preparing for your procedure (without sedation) Instructions: . Oral Intake: Do not eat or drink anything for at least 3 hours prior to your procedure. . Transportation: Unless otherwise stated by your physician, you may drive yourself after the procedure. . Blood Pressure Medicine: Take your blood pressure medicine with a sip of water the morning of the procedure. . Insulin: Take only  of your normal insulin dose. . Preventing infections: Shower with an  antibacterial soap the morning of your procedure. . Build-up your immune system: Take 1000 mg of Vitamin C with every meal (3 times a day) the day prior to your procedure. . Pregnancy: If you are pregnant, call and cancel the procedure. . Sickness: If you have a cold, fever, or any active infections, call and cancel the procedure. . Arrival: You must be in the facility at least 30 minutes prior to your scheduled procedure. . Children: Do not bring any children with you. . Dress appropriately: Bring dark clothing that you would not mind if they get stained. . Valuables: Do not bring any jewelry or valuables. Procedure appointments are reserved for interventional treatments only. Marland Kitchen. No Prescription Refills. . No medication changes will be discussed during procedure appointments. No disability issues will be discussed.

## 2016-09-05 LAB — TOXASSURE SELECT 13 (MW), URINE

## 2016-09-08 ENCOUNTER — Other Ambulatory Visit
Admission: RE | Admit: 2016-09-08 | Discharge: 2016-09-08 | Disposition: A | Discharge status: Home / Self Care | Payer: Medicaid Other | Source: Ambulatory Visit | Attending: Interventional Radiology | Admitting: Interventional Radiology

## 2016-09-08 ENCOUNTER — Encounter: Payer: Self-pay | Admitting: Pain Medicine

## 2016-09-08 ENCOUNTER — Ambulatory Visit: Payer: Medicaid Other | Attending: Pain Medicine | Admitting: Pain Medicine

## 2016-09-08 VITALS — BP 149/84 | HR 94 | Temp 98.6°F | Resp 16 | Ht 68.0 in | Wt 190.0 lb

## 2016-09-08 DIAGNOSIS — M1732 Unilateral post-traumatic osteoarthritis, left knee: Secondary | ICD-10-CM | POA: Diagnosis not present

## 2016-09-08 DIAGNOSIS — G609 Hereditary and idiopathic neuropathy, unspecified: Secondary | ICD-10-CM

## 2016-09-08 DIAGNOSIS — M25561 Pain in right knee: Secondary | ICD-10-CM

## 2016-09-08 DIAGNOSIS — G8929 Other chronic pain: Secondary | ICD-10-CM | POA: Insufficient documentation

## 2016-09-08 DIAGNOSIS — R252 Cramp and spasm: Secondary | ICD-10-CM

## 2016-09-08 DIAGNOSIS — M1711 Unilateral primary osteoarthritis, right knee: Secondary | ICD-10-CM | POA: Insufficient documentation

## 2016-09-08 DIAGNOSIS — M47816 Spondylosis without myelopathy or radiculopathy, lumbar region: Secondary | ICD-10-CM

## 2016-09-08 DIAGNOSIS — M25062 Hemarthrosis, left knee: Secondary | ICD-10-CM | POA: Diagnosis not present

## 2016-09-08 DIAGNOSIS — Z880 Allergy status to penicillin: Secondary | ICD-10-CM | POA: Diagnosis not present

## 2016-09-08 DIAGNOSIS — M170 Bilateral primary osteoarthritis of knee: Secondary | ICD-10-CM | POA: Diagnosis present

## 2016-09-08 DIAGNOSIS — M25562 Pain in left knee: Secondary | ICD-10-CM | POA: Insufficient documentation

## 2016-09-08 DIAGNOSIS — G629 Polyneuropathy, unspecified: Secondary | ICD-10-CM | POA: Insufficient documentation

## 2016-09-08 DIAGNOSIS — M1288 Other specific arthropathies, not elsewhere classified, other specified site: Secondary | ICD-10-CM

## 2016-09-08 DIAGNOSIS — M545 Low back pain: Secondary | ICD-10-CM | POA: Diagnosis not present

## 2016-09-08 DIAGNOSIS — M792 Neuralgia and neuritis, unspecified: Secondary | ICD-10-CM

## 2016-09-08 DIAGNOSIS — G894 Chronic pain syndrome: Secondary | ICD-10-CM | POA: Diagnosis present

## 2016-09-08 DIAGNOSIS — M62830 Muscle spasm of back: Secondary | ICD-10-CM

## 2016-09-08 MED ORDER — LIDOCAINE HCL (PF) 1 % IJ SOLN
INTRAMUSCULAR | Status: AC
  Filled 2016-09-08: qty 10

## 2016-09-08 MED ORDER — ROPIVACAINE HCL 2 MG/ML IJ SOLN
INTRAMUSCULAR | Status: AC
  Filled 2016-09-08: qty 20

## 2016-09-08 MED ORDER — MAGNESIUM OXIDE -MG SUPPLEMENT 500 MG PO CAPS: 1.0000 | ORAL_CAPSULE | Freq: Two times a day (BID) | ORAL | 0 refills | Status: AC

## 2016-09-08 MED ORDER — HYDROCODONE-ACETAMINOPHEN 5-325 MG PO TABS: 1.0000 | ORAL_TABLET | Freq: Two times a day (BID) | ORAL | 0 refills | Status: DC | PRN

## 2016-09-08 MED ORDER — METHYLPREDNISOLONE ACETATE 80 MG/ML IJ SUSP
INTRAMUSCULAR | Status: AC
  Filled 2016-09-08: qty 1

## 2016-09-08 MED ORDER — METHYLPREDNISOLONE ACETATE 80 MG/ML IJ SUSP
80.0000 mg | Freq: Once | INTRAMUSCULAR | Status: AC
  Administered 2016-09-08: 80 mg

## 2016-09-08 MED ORDER — LIDOCAINE HCL (PF) 1 % IJ SOLN: 10.0000 mL | Freq: Once | INTRAMUSCULAR | Status: DC

## 2016-09-08 MED ORDER — PREGABALIN 75 MG PO CAPS: 75.0000 mg | ORAL_CAPSULE | Freq: Three times a day (TID) | ORAL | 0 refills | Status: DC

## 2016-09-08 MED ORDER — TIZANIDINE HCL 4 MG PO CAPS: 4.0000 mg | ORAL_CAPSULE | Freq: Three times a day (TID) | ORAL | 0 refills | Status: DC | PRN

## 2016-09-08 MED ORDER — METHYLPREDNISOLONE ACETATE 80 MG/ML IJ SUSP
80.0000 mg | Freq: Once | INTRAMUSCULAR | Status: AC
  Administered 2016-09-08: 80 mg via INTRA_ARTICULAR

## 2016-09-08 MED ORDER — ROPIVACAINE HCL 5 MG/ML IJ SOLN: 8.0000 mL | Freq: Once | INTRAMUSCULAR | Status: DC

## 2016-09-08 NOTE — Progress Notes (Signed)
Safety precautions to be maintained throughout the outpatient stay will include: orient to surroundings, keep bed in low position, maintain call bell within reach at all times, provide assistance with transfer out of bed and ambulation.  

## 2016-09-08 NOTE — Progress Notes (Signed)
Patient's Name: Erica [name]  MRN: [id number]  Referring Provider: [facility]  DOB: [birth year]  PCP: [facility]  DOS: 09/08/2016  Note by: Sydnee Levans. Laban Emperor, MD  Service setting: Ambulatory outpatient  Location: [facility] (AMB) Pain Management Facility  Visit type: Procedure  Specialty: Interventional Pain Management  Patient type: Established   Primary Reason for Visit: Interventional Pain Management Treatment. CC: Knee Pain (bilateral)  Procedure:  Anesthesia, Analgesia, Anxiolysis:  Type: Diagnostic Intra-Articular Local anesthetic and steroid Knee Injection Region: Lateral infrapatellar Knee Region Level: Knee Joint Laterality: Bilateral  Type: Local Anesthesia Local Anesthetic: Lidocaine 1% Route: Infiltration ([place]/IM) IV Access: Declined Sedation: Declined  Indication(s): Analgesia          Indications: 1. Osteoarthritis of knee (Bilateral) (L>R)   2. Chronic knee pain (Location of Tertiary source of pain) (Bilateral) (L>R)   3. Chronic pain syndrome   4. Peripheral neuropathy, idiopathic (upper and lower extremity)   5. Neurogenic pain   6. Lumbar facet syndrome (Bilateral) (R>L)   7. Lumbar spondylosis   8. Chronic low back pain (Location of Primary Source of Pain) (Bilateral) (L>R)   9. Knee hemarthrosis (Left)   10. Intermittent left thoracic Muscle cramps   11. Spasm of thoracolumbar muscle (Left)   12. Post-traumatic osteoarthritis of knee (Left)    Pain Score: Pre-procedure: 6 /10 Post-procedure: 0-No pain (knee pain is zero)/10  Pre-op Assessment:  Previous date of service: 08/31/16 Service provided: Evaluation Erica [name] is a 51 y.o. (year old), female patient, seen today for interventional treatment. She  has a past surgical history that includes Knee surgery (Left) and Flexible bronchoscopy (N/A, 06/20/2015). Her primarily concern today is the Knee Pain (bilateral)  Initial Vital Signs: Blood pressure (!) 157/84, pulse (!)  105, temperature 98.6 F (37 C), temperature source Oral, resp. rate 16, height 5\' 8"  (1.727 m), weight 190 lb (86.2 kg), last menstrual period 03/13/2015, SpO2 97 %. BMI: 28.89 kg/m  Risk Assessment: Allergies: Reviewed. She is allergic to tramadol and penicillins.  Allergy Precautions: None required Coagulopathies: "Reviewed. None identified.  Blood-thinner therapy: None at this time Active Infection(s): Reviewed. None identified. [name] is afebrile  Site Confirmation: [name] was asked to confirm the procedure and laterality before marking the site Procedure checklist: Completed Consent: Before the procedure and under the influence of no sedative(s), amnesic(s), or anxiolytics, the patient was informed of the treatment options, risks and possible complications. To fulfill our ethical and legal obligations, as recommended by the American Medical Association's Code of Ethics, I have informed the patient of my clinical impression; the nature and purpose of the treatment or procedure; the risks, benefits, and possible complications of the intervention; the alternatives, including doing nothing; the risk(s) and benefit(s) of the alternative treatment(s) or procedure(s); and the risk(s) and benefit(s) of doing nothing. The patient was provided information about the general risks and possible complications associated with the procedure. These may include, but are not limited to: failure to achieve desired goals, infection, bleeding, organ or nerve damage, allergic reactions, paralysis, and death. In addition, the patient was informed of those risks and complications associated to the procedure, such as failure to decrease pain; infection; bleeding; organ or nerve damage with subsequent damage to sensory, motor, and/or autonomic systems, resulting in permanent pain, numbness, and/or weakness of one or several areas of the body; allergic reactions; (i.e.: anaphylactic reaction); and/or  death. Furthermore, the patient was informed of those risks and complications associated with  the medications. These include, but are not limited to: allergic reactions (i.e.: anaphylactic or anaphylactoid reaction(s)); adrenal axis suppression; blood sugar elevation that in diabetics may result in ketoacidosis or comma; water retention that in patients with history of congestive heart failure may result in shortness of breath, pulmonary edema, and decompensation with resultant heart failure; weight gain; swelling or edema; medication-induced neural toxicity; particulate matter embolism and blood vessel occlusion with resultant organ, and/or nervous system infarction; and/or aseptic necrosis of one or more joints. Finally, the patient was informed that Medicine is not an exact science; therefore, there is also the possibility of unforeseen or unpredictable risks and/or possible complications that may result in a catastrophic outcome. The patient indicated having understood very clearly. We have given the patient no guarantees and we have made no promises. Enough time was given to the patient to ask questions, all of which were answered to the patient's satisfaction. [name] has indicated that she wanted to continue with the procedure. Attestation: I, the ordering provider, attest that I have discussed with the patient the benefits, risks, side-effects, alternatives, likelihood of achieving goals, and potential problems during recovery for the procedure that I have provided informed consent. Date: 09/08/2016; Time: 2:51 PM  Pre-Procedure Preparation:  Monitoring: As per clinic protocol. Respiration, ETCO2, SpO2, BP, heart rate and rhythm monitor placed and checked for adequate function Safety Precautions: Patient was assessed for positional comfort and pressure points before starting the procedure. Time-out: I initiated and conducted the "Time-out" before starting the procedure, as per protocol. The  patient was asked to participate by confirming the accuracy of the "Time Out" information. Verification of the correct person, site, and procedure were performed and confirmed by me, the nursing staff, and the patient. "Time-out" conducted as per Joint Commission's Universal Protocol (UP.01.01.01). "Time-out" Date & Time: 09/08/2016; 1455 hrs.  Description of Procedure Process:   Position: Sitting Target Area: Knee Joint Approach: Just above the Lateral tibial plateau, lateral to the infrapatellar tendon. Area Prepped: Entire knee area, from the mid-thigh to the mid-shin. Prepping solution: ChloraPrep (2% chlorhexidine gluconate and 70% isopropyl alcohol) Safety Precautions: Aspiration looking for blood return was conducted prior to all injections. At no point did we inject any substances, as a needle was being advanced. No attempts were made at seeking any paresthesias. Safe injection practices and needle disposal techniques used. Medications properly checked for expiration dates. SDV (single dose vial) medications used. Description of the Procedure: Protocol guidelines were followed. The patient was placed in position over the fluoroscopy table. The target area was identified and the area prepped in the usual manner. Skin desensitized using vapocoolant spray. Skin & deeper tissues infiltrated with local anesthetic. Appropriate amount of time allowed to pass for local anesthetics to take effect. The procedure needles were then advanced to the target area. Proper needle placement secured. Negative aspiration confirmed. Solution injected in intermittent fashion, asking for systemic symptoms every 0.5cc of injectate. The needles were then removed and the area cleansed, making sure to leave some of the prepping solution back to take advantage of its long term bactericidal properties. A total of 23 MLS of synovial fluid was removed from the left knee. This was sent for analysis. Vitals:   09/08/16 1500  09/08/16 1505 09/08/16 1510 09/08/16 1512  BP: (!) 168/40 (!) 145/94 (!) 144/79 (!) 149/84  Pulse: 96 92 99 94  Resp: 16 16 16 16   Temp:      TempSrc:      SpO2: 98%  96% 97% 95%  Weight:      Height:        Start Time: 1455 hrs. End Time: 1512 hrs. Materials:  Needle(s) Type: Regular needle Gauge: 22G Length: 1.5-in Medication(s): We administered methylPREDNISolone acetate and methylPREDNISolone acetate. Please see chart orders for dosing details.  Imaging Guidance:  Type of Imaging Technique: None used Indication(s): N/A Exposure Time: No patient exposure Contrast: None used. Fluoroscopic Guidance: N/A Ultrasound Guidance: N/A Interpretation: N/A  Antibiotic Prophylaxis:  Indication(s): None identified Antibiotic given: None  Post-operative Assessment:  EBL: None Complications: No immediate post-treatment complications observed by team, or reported by patient. Note: The patient tolerated the entire procedure well. A repeat set of vitals were taken after the procedure and the patient was kept under observation following institutional policy, for this type of procedure. Post-procedural neurological assessment was performed, showing return to baseline, prior to discharge. The patient was provided with post-procedure discharge instructions, including a section on how to identify potential problems. Should any problems arise concerning this procedure, the patient was given instructions to immediately contact us, at any time, without hesitation. In any case, we plan to contact the patient by telephone for a follow-up status report regarding this interventional procedure. Comments:  No additional relevant information.  Plan of Care  Disposition: Discharge home  Discharge Date & Time: 09/08/2016; 1527 (sent two vials of fluid aspirated from knee to [facility] lab) hrs.  Physician-requested Follow-up:  Return in about 2 weeks (around 09/22/2016) for Post-Procedure evaluation, in  addition.  Future Appointments Date Time Provider Department Center  09/13/2016 3:00 PM Antonieta Iba, MD [facility]  09/29/2016 10:45 AM Delano Metz, MD [facility] None  12/07/2016 1:00 PM Delma Freeze, FNP [facility] None   Medications ordered for procedure: Meds ordered this encounter  Medications  . HYDROcodone-acetaminophen (NORCO/VICODIN) 5-325 MG tablet    Sig: Take 1 tablet by mouth every 12 (twelve) hours as needed for moderate pain.    Dispense:  60 tablet    Refill:  0    Patient may have prescription filled one day early if pharmacy is closed on scheduled refill date. Do not fill until: 09/30/16 To last until: 10/30/16  . pregabalin (LYRICA) 75 MG capsule    Sig: Take 1 capsule (75 mg total) by mouth 3 (three) times daily.    Dispense:  90 capsule    Refill:  0    Do not add this medication to the electronic "Automatic Refill" notification system. Patient may have prescription filled one day early if pharmacy is closed on scheduled refill date.  . methylPREDNISolone acetate (DEPO-MEDROL) injection 80 mg  . methylPREDNISolone acetate (DEPO-MEDROL) injection 80 mg  . lidocaine (PF) (XYLOCAINE) 1 % injection 10 mL  . ropivacaine (PF) 5 mg/mL (0.5%) (NAROPIN) injection 8 mL  . tiZANidine (ZANAFLEX) 4 MG capsule    Sig: Take 1 capsule (4 mg total) by mouth 3 (three) times daily as needed for muscle spasms.    Dispense:  90 capsule    Refill:  0    Do not place this medication, or any other prescription from our practice, on "Automatic Refill". Patient may have prescription filled one day early if pharmacy is closed on scheduled refill date.  . Magnesium Oxide 500 MG CAPS    Sig: Take 1 capsule (500 mg total) by mouth 2 (two) times daily at 8 am and 10 pm.    Dispense:  30 capsule    Refill:  0    Do  not add to the "Automatic Refill" notification system.   Medications administered: We administered methylPREDNISolone acetate and methylPREDNISolone acetate.   See the medical record for exact dosing, route, and time of administration.  Lab-work, Procedure(s), & Referral(s) Ordered: Orders Placed This Encounter  Procedures  . KNEE INJECTION  . Radiofrequency,Lumbar  . MR KNEE LEFT WO CONTRAST  . MR KNEE RIGHT WO CONTRAST  . Uric Acid, Synovial Fluid  . Synovial Fluid Panel  . Discharge instructions  . Follow-up  . Informed Consent Details: Transcribe to consent form and obtain patient signature  . Provider attestation of informed consent for procedure/surgical case  . Verify informed consent   Imaging Ordered: Results for orders placed in visit on 08/09/16  DG C-Arm 1-60 Min-No Report   Narrative There is no Radiologist interpretation  for this exam.   New Prescriptions   MAGNESIUM OXIDE 500 MG CAPS    Take 1 capsule (500 mg total) by mouth 2 (two) times daily at 8 am and 10 pm.   TIZANIDINE (ZANAFLEX) 4 MG CAPSULE    Take 1 capsule (4 mg total) by mouth 3 (three) times daily as needed for muscle spasms.   Primary Care Physician: [facility] Location: [facility] Note by: Sydnee Levans. Laban Emperor, M.D, DABA, DABAPM, DABPM, DABIPP, FIPP Date: 09/08/2016; Time: 5:58 PM  Disclaimer:  Medicine is not an exact science. The only guarantee in medicine is that nothing is guaranteed. It is important to note that the decision to proceed with this intervention was based on the information collected from the patient. The Data and conclusions were drawn from the patient's questionnaire, the interview, and the physical examination. Because the information was provided in large part by the patient, it cannot be guaranteed that it has not been purposely or unconsciously manipulated. Every effort has been made to obtain as much relevant data as possible for this evaluation. It is important to note that the conclusions that lead to this procedure are derived in large part from the available data. Always take  into account that the treatment will also be dependent on availability of resources and existing treatment guidelines, considered by other Pain Management Practitioners as being common knowledge and practice, at the time of the intervention. For Medico-Legal purposes, it is also important to point out that variation in procedural techniques and pharmacological choices are the acceptable norm. The indications, contraindications, technique, and results of the above procedure should only be interpreted and judged by a Board-Certified Interventional Pain Specialist with extensive familiarity and expertise in the same exact procedure and technique. Attempts at providing opinions without similar or greater experience and expertise than that of the treating physician will be considered as inappropriate and unethical, and shall result in a formal complaint to the state medical board and applicable specialty societies.  Instructions provided at this appointment: Patient Instructions   Pain Score  Introduction: The pain score used by this practice is the Verbal Numerical Rating Scale (VNRS-11). This is an 11-point scale. It is for adults and children 10 years or older. There are significant differences in how the pain score is reported, used, and applied. Forget everything you learned in the past and learn this scoring system.  General Information: The scale should reflect your current level of pain. Unless you are specifically asked for the level of your worst pain, or your average pain. If you are asked for one of these two, then it should be understood that it is  over the past 24 hours.  Basic Activities of Daily Living (ADL): Personal hygiene, dressing, eating, transferring, and using restroom.  Instructions: Most patients tend to report their level of pain as a combination of two factors, their physical pain and their psychosocial pain. This last one is also known as "suffering" and it is reflection of how  physical pain affects you socially and psychologically. From now on, report them separately. From this point on, when asked to report your pain level, report only your physical pain. Use the following table for reference.  Pain Clinic Pain Levels (0-5/10)  Pain Level Score Description  No Pain 0   Mild pain 1 Nagging, annoying, but does not interfere with basic activities of daily living (ADL). Patients are able to eat, bathe, get dressed, toileting (being able to get on and off the toilet and perform personal hygiene functions), transfer (move in and out of bed or a chair without assistance), and maintain continence (able to control bladder and bowel functions). Blood pressure and heart rate are unaffected. A normal heart rate for a healthy adult ranges from 60 to 100 bpm (beats per minute).   Mild to moderate pain 2 Noticeable and distracting. Impossible to hide from other people. More frequent flare-ups. Still possible to adapt and function close to normal. It can be very annoying and may have occasional stronger flare-ups. With discipline, patients may get used to it and adapt.   Moderate pain 3 Interferes significantly with activities of daily living (ADL). It becomes difficult to feed, bathe, get dressed, get on and off the toilet or to perform personal hygiene functions. Difficult to get in and out of bed or a chair without assistance. Very distracting. With effort, it can be ignored when deeply involved in activities.   Moderately severe pain 4 Impossible to ignore for more than a few minutes. With effort, patients may still be able to manage work or participate in some social activities. Very difficult to concentrate. Signs of autonomic nervous system discharge are evident: dilated pupils (mydriasis); mild sweating (diaphoresis); sleep interference. Heart rate becomes elevated (>115 bpm). Diastolic blood pressure (lower number) rises above 100 mmHg. Patients find relief in laying down and not  moving.   Severe pain 5 Intense and extremely unpleasant. Associated with frowning face and frequent crying. Pain overwhelms the senses.  Ability to do any activity or maintain social relationships becomes significantly limited. Conversation becomes difficult. Pacing back and forth is common, as getting into a comfortable position is nearly impossible. Pain wakes you up from deep sleep. Physical signs will be obvious: pupillary dilation; increased sweating; goosebumps; brisk reflexes; cold, clammy hands and feet; nausea, vomiting or dry heaves; loss of appetite; significant sleep disturbance with inability to fall asleep or to remain asleep. When persistent, significant weight loss is observed due to the complete loss of appetite and sleep deprivation.  Blood pressure and heart rate becomes significantly elevated. Caution: If elevated blood pressure triggers a pounding headache associated with blurred vision, then the patient should immediately seek attention at an urgent or emergency care unit, as these may be signs of an impending stroke.    Emergency Department Pain Levels (6-10/10)  Emergency Room Pain 6 Severely limiting. Requires emergency care and should not be seen or managed at an outpatient pain management facility. Communication becomes difficult and requires great effort. Assistance to reach the emergency department may be required. Facial flushing and profuse sweating along with potentially dangerous increases in heart rate and blood pressure  will be evident.   Distressing pain 7 Self-care is very difficult. Assistance is required to transport, or use restroom. Assistance to reach the emergency department will be required. Tasks requiring coordination, such as bathing and getting dressed become very difficult.   Disabling pain 8 Self-care is no longer possible. At this level, pain is disabling. The individual is unable to do even the most "basic" activities such as walking, eating, bathing,  dressing, transferring to a bed, or toileting. Fine motor skills are lost. It is difficult to think clearly.   Incapacitating pain 9 Pain becomes incapacitating. Thought processing is no longer possible. Difficult to remember your own name. Control of movement and coordination are lost.   The worst pain imaginable 10 At this level, most patients pass out from pain. When this level is reached, collapse of the autonomic nervous system occurs, leading to a sudden drop in blood pressure and heart rate. This in turn results in a temporary and dramatic drop in blood flow to the brain, leading to a loss of consciousness. Fainting is one of the body's self defense mechanisms. Passing out puts the brain in a calmed state and causes it to shut down for a while, in order to begin the healing process.    Summary: 1. Refer to this scale when providing [place] with your pain level. 2. Be accurate and careful when reporting your pain level. This will help with your care. 3. Over-reporting your pain level will lead to loss of credibility. 4. Even a level of 1/10 means that there is pain and will be treated at our facility. 5. High, inaccurate reporting will be documented as "Symptom Exaggeration", leading to loss of credibility and suspicions of possible secondary gains such as obtaining more narcotics, or wanting to appear disabled, for fraudulent reasons. 6. Only pain levels of 5 or below will be seen at our facility. 7. Pain levels of 6 and above will be sent to the Emergency Department and the appointment cancelled. _____________________________________________________________________________________________  Radiofrequency Lesioning Introduction Radiofrequency lesioning is a procedure that is performed to relieve pain. The procedure is often used for back, neck, or arm pain. Radiofrequency lesioning involves the use of a machine that creates radio waves to make heat. During the procedure, the heat is applied to  the nerve that carries the pain signal. The heat damages the nerve and interferes with the pain signal. Pain relief usually starts about 2 weeks after the procedure and lasts for 6 months to 1 year. Tell a health care provider about:  Any allergies you have.  All medicines you are taking, including vitamins, herbs, eye drops, creams, and over-the-counter medicines.  Any problems you or family members have had with anesthetic medicines.  Any blood disorders you have.  Any surgeries you have had.  Any medical conditions you have.  Whether you are pregnant or may be pregnant. What are the risks? Generally, this is a safe procedure. However, problems may occur, including:  Pain or soreness at the injection site.  Infection at the injection site.  Damage to nerves or blood vessels. What happens before the procedure?  Ask your health care provider about:  Changing or stopping your regular medicines. This is especially important if you are taking diabetes medicines or blood thinners.  Taking medicines such as aspirin and ibuprofen. These medicines can thin your blood. Do not take these medicines before your procedure if your health care provider instructs you not to.  Follow instructions from your health care provider  about eating or drinking restrictions.  Plan to have someone take you home after the procedure.  If you go home right after the procedure, plan to have someone with you for 24 hours. What happens during the procedure?  You will be given one or more of the following:  A medicine to help you relax (sedative).  A medicine to numb the area (local anesthetic).  You will be awake during the procedure. You will need to be able to talk with the health care provider during the procedure.  With the help of a type of X-ray (fluoroscopy), the health care provider will insert a radiofrequency needle into the area to be treated.  Next, a wire that carries the radio waves  (electrode) will be put through the radiofrequency needle. An electrical pulse will be sent through the electrode to verify the correct nerve. You will feel a tingling sensation, and you may have muscle twitching.  Then, the tissue that is around the needle tip will be heated by an electric current that is passed using the radiofrequency machine. This will numb the nerves.  A bandage (dressing) will be put on the insertion area after the procedure is done. The procedure may vary among health care providers and hospitals. What happens after the procedure?  Your blood pressure, heart rate, breathing rate, and blood oxygen level will be monitored often until the medicines you were given have worn off.  Return to your normal activities as directed by your health care provider. This information is not intended to replace advice given to you by your health care provider. Make sure you discuss any questions you have with your health care provider. Document Released: 03/03/2011 Document Revised: 12/11/2015 Document Reviewed: 08/12/2014  2017 [employer] Radiofrequency Lesioning, Care After Introduction Refer to this sheet in the next few weeks. These instructions provide you with information about caring for yourself after your procedure. Your health care provider may also give you more specific instructions. Your treatment has been planned according to current medical practices, but problems sometimes occur. Call your health care provider if you have any problems or questions after your procedure. What can I expect after the procedure? After the procedure, it is common to have:  Pain from the burned nerve.  Temporary numbness. Follow these instructions at home:  Take over-the-counter and prescription medicines only as told by your health care provider.  Return to your normal activities as told by your health care provider. Ask your health care provider what activities are safe for you.  Pay close  attention to how you feel after the procedure. If you start to have pain, write down when it hurts and how it feels. This will help you and your health care provider to know if you need an additional treatment.  Check your needle insertion site every day for signs of infection. Watch for:  Redness, swelling, or pain.  Fluid, blood, or pus.  Keep all follow-up visits as told by your health care provider. This is important. Contact a health care provider if:  Your pain does not get better.  You have redness, swelling, or pain at the needle insertion site.  You have fluid, blood, or pus coming from the needle insertion site.  You have a fever. Get help right away if:  You develop sudden, severe pain.  You develop numbness or tingling near the procedure site that does not go away. This information is not intended to replace advice given to you by your health care  provider. Make sure you discuss any questions you have with your health care provider. Document Released: 03/04/2011 Document Revised: 12/11/2015 Document Reviewed: 08/12/2014  2017 [employer] Pain Management Discharge Instructions  General Discharge Instructions :  If you need to reach your doctor call: Monday-Friday 8:00 am - 4:00 pm at [phone] or toll free [phone].  After clinic hours [phone] to have operator reach doctor.  Bring all of your medication bottles to all your appointments in the pain clinic.  To cancel or reschedule your appointment with Pain Management please remember to call 24 hours in advance to avoid a fee.  Refer to the educational materials which you have been given on: General Risks, I had my Procedure. Discharge Instructions, Post Sedation.  Post Procedure Instructions:  The drugs you were given will stay in your system until tomorrow, so for the next 24 hours you should not drive, make any legal decisions or drink any alcoholic beverages.  You may eat anything you prefer, but it  is better to start with liquids then soups and crackers, and gradually work up to solid foods.  Please notify your doctor immediately if you have any unusual bleeding, trouble breathing or pain that is not related to your normal pain.  Depending on the type of procedure that was done, some parts of your body may feel week and/or numb.  This usually clears up by tonight or the next day.  Walk with the use of an assistive device or accompanied by an adult for the 24 hours.  You may use ice on the affected area for the first 24 hours.  Put ice in a Ziploc bag and cover with a towel and place against area 15 minutes on 15 minutes off.  You may switch to heat after 24 hours.Facet Joint Block The facet joints connect the bones of the spine (vertebrae). They make it possible for you to bend, twist, and make other movements with your spine. They also prevent you from overbending, overtwisting, and making other excessive movements.  A facet joint block is a procedure where a numbing medicine (anesthetic) is injected into a facet joint. Often, a type of anti-inflammatory medicine called a steroid is also injected. A facet joint block may be done for two reasons:   Diagnosis. A facet joint block may be done as a test to see whether neck or back pain is caused by a worn-down or infected facet joint. If the pain gets better after a facet joint block, it means the pain is probably coming from the facet joint. If the pain does not get better, it means the pain is probably not coming from the facet joint.   Therapy. A facet joint block may be done to relieve neck or back pain caused by a facet joint. A facet joint block is only done as a therapy if the pain does not improve with medicine, exercise programs, physical therapy, and other forms of pain management. [facility] KNOW ABOUT:   Any allergies you have.   All medicines you are taking, including vitamins, herbs, eyedrops, and  over-the-counter medicines and creams.   Previous problems you or members of your family have had with the use of anesthetics.   Any blood disorders you have had.   Other health problems you have. RISKS AND COMPLICATIONS Generally, having a facet joint block is safe. However, as with any procedure, complications can occur. Possible complications associated with having a facet joint block include:   Bleeding.  Injury to a nerve near the injection site.   Pain at the injection site.   Weakness or numbness in areas controlled by nerves near the injection site.   Infection.   Temporary fluid retention.   Allergic reaction to anesthetics or medicines used during the procedure. BEFORE THE PROCEDURE   Follow your health care provider's instructions if you are taking dietary supplements or medicines. You may need to stop taking them or reduce your dosage.   Do not take any new dietary supplements or medicines without asking your health care provider first.   Follow your health care provider's instructions about eating and drinking before the procedure. You may need to stop eating and drinking several hours before the procedure.   Arrange to have an adult drive you home after the procedure. PROCEDURE  You may need to remove your clothing and dress in an open-back gown so that your health care provider can access your spine.   The procedure will be done while you are lying on an X-ray table. Most of the time you will be asked to lie on your stomach, but you may be asked to lie in a different position if an injection will be made in your neck.   Special machines will be used to monitor your oxygen levels, heart rate, and blood pressure.   If an injection will be made in your neck, an intravenous (IV) tube will be inserted into one of your veins. Fluids and medicine will flow directly into your body through the IV tube.   The area over the facet joint where the  injection will be made will be cleaned with an antiseptic soap. The surrounding skin will be covered with sterile drapes.   An anesthetic will be applied to your skin to make the injection area numb. You may feel a temporary stinging or burning sensation.   A video X-ray machine will be used to locate the joint. A contrast dye may be injected into the facet joint area to help with locating the joint.   When the joint is located, an anesthetic medicine will be injected into the joint through the needle.   Your health care provider will ask you whether you feel pain relief. If you do feel relief, a steroid may be injected to provide pain relief for a longer period of time. If you do not feel relief or feel only partial relief, additional injections of an anesthetic may be made in other facet joints.   The needle will be removed, the skin will be cleansed, and bandages will be applied.  AFTER THE PROCEDURE   You will be observed for 15-30 minutes before being allowed to go home. Do not drive. Have an adult drive you or take a taxi or public transportation instead.   If you feel pain relief, the pain will return in several hours or days when the anesthetic wears off.   You may feel pain relief 2-14 days after the procedure. The amount of time this relief lasts varies from person to person.   It is normal to feel some tenderness over the injected area(s) for 2 days following the procedure.   If you have diabetes, you may have a temporary increase in blood sugar. This information is not intended to replace advice given to you by your health care provider. Make sure you discuss any questions you have with your health care provider. Document Released: 11/24/2006 Document Revised: 07/26/2014 Document Reviewed: 03/31/2015 [employer] Interactive Patient Education  2017 [employer] Facet Joint Block, Care After Refer to this sheet in the next few weeks. These instructions provide you with  information on caring for yourself after your procedure. Your health care provider may also give you more specific instructions. Your treatment has been planned according to current medical practices, but problems sometimes occur. Call your health care provider if you have any problems or questions after your procedure. HOME CARE INSTRUCTIONS   Keep track of the amount of pain relief you feel and how long it lasts.  Limit pain medicine within the first 4-6 hours after the procedure as directed by your health care provider.  Resume taking dietary supplements and medicines as directed by your health care provider.  You may resume your regular diet.  Do not apply heat near or over the injection site(s) for 24 hours.   Do not take a bath or soak in water (such as a pool or lake) for 24 hours.  Do not drive for 24 hours unless approved by your health care provider.  Avoid strenuous activity for 24 hours.  Remove your bandages the morning after the procedure.   If the injection site is tender, applying an ice pack may relieve some tenderness. To do this:  Put ice in a bag.  Place a towel between your skin and the bag.  Leave the ice on for 15-20 minutes, 3-4 times a day.  Keep follow-up appointments as directed by your health care provider. SEEK MEDICAL CARE IF:   Your pain is not controlled by your medicines.   There is drainage from the injection site.   There is significant bleeding or swelling at the injection site.  You have diabetes and your blood sugar is above 180 mg/dL. SEEK IMMEDIATE MEDICAL CARE IF:   You develop a fever of 101F (38.3C) or greater.   You have worsening pain or swelling around the injection site.   You have red streaking around the injection site.   You develop severe pain that is not controlled by your medicines.   You develop a headache, stiff neck, nausea, or vomiting.   Your eyes become very sensitive to light.   You have  weakness, paralysis, or tingling in your arms or legs that was not present before the procedure.   You develop difficulty urinating or breathing.  This information is not intended to replace advice given to you by your health care provider. Make sure you discuss any questions you have with your health care provider. Document Released: 06/21/2012 Document Revised: 07/26/2014 Document Reviewed: 03/31/2015 [employer] Interactive Patient Education  2017 [employer].

## 2016-09-08 NOTE — Patient Instructions (Addendum)
Pain Score  Introduction: The pain score used by this practice is the Verbal Numerical Rating Scale (VNRS-11). This is an 11-point scale. It is for adults and children 10 years or older. There are significant differences in how the pain score is reported, used, and applied. Forget everything you learned in the past and learn this scoring system.  General Information: The scale should reflect your current level of pain. Unless you are specifically asked for the level of your worst pain, or your average pain. If you are asked for one of these two, then it should be understood that it is over the past 24 hours.  Basic Activities of Daily Living (ADL): Personal hygiene, dressing, eating, transferring, and using restroom.  Instructions: Most patients tend to report their level of pain as a combination of two factors, their physical pain and their psychosocial pain. This last one is also known as "suffering" and it is reflection of how physical pain affects you socially and psychologically. From now on, report them separately. From this point on, when asked to report your pain level, report only your physical pain. Use the following table for reference.  Pain Clinic Pain Levels (0-5/10)  Pain Level Score Description  No Pain 0   Mild pain 1 Nagging, annoying, but does not interfere with basic activities of daily living (ADL). Patients are able to eat, bathe, get dressed, toileting (being able to get on and off the toilet and perform personal hygiene functions), transfer (move in and out of bed or a chair without assistance), and maintain continence (able to control bladder and bowel functions). Blood pressure and heart rate are unaffected. A normal heart rate for a healthy adult ranges from 60 to 100 bpm (beats per minute).   Mild to moderate pain 2 Noticeable and distracting. Impossible to hide from other people. More frequent flare-ups. Still possible to adapt and function close to normal. It can be very  annoying and may have occasional stronger flare-ups. With discipline, patients may get used to it and adapt.   Moderate pain 3 Interferes significantly with activities of daily living (ADL). It becomes difficult to feed, bathe, get dressed, get on and off the toilet or to perform personal hygiene functions. Difficult to get in and out of bed or a chair without assistance. Very distracting. With effort, it can be ignored when deeply involved in activities.   Moderately severe pain 4 Impossible to ignore for more than a few minutes. With effort, patients may still be able to manage work or participate in some social activities. Very difficult to concentrate. Signs of autonomic nervous system discharge are evident: dilated pupils (mydriasis); mild sweating (diaphoresis); sleep interference. Heart rate becomes elevated (>115 bpm). Diastolic blood pressure (lower number) rises above 100 mmHg. Patients find relief in laying down and not moving.   Severe pain 5 Intense and extremely unpleasant. Associated with frowning face and frequent crying. Pain overwhelms the senses.  Ability to do any activity or maintain social relationships becomes significantly limited. Conversation becomes difficult. Pacing back and forth is common, as getting into a comfortable position is nearly impossible. Pain wakes you up from deep sleep. Physical signs will be obvious: pupillary dilation; increased sweating; goosebumps; brisk reflexes; cold, clammy hands and feet; nausea, vomiting or dry heaves; loss of appetite; significant sleep disturbance with inability to fall asleep or to remain asleep. When persistent, significant weight loss is observed due to the complete loss of appetite and sleep deprivation.  Blood pressure and heart   rate becomes significantly elevated. Caution: If elevated blood pressure triggers a pounding headache associated with blurred vision, then the patient should immediately seek attention at an urgent or  emergency care unit, as these may be signs of an impending stroke.    Emergency Department Pain Levels (6-10/10)  Emergency Room Pain 6 Severely limiting. Requires emergency care and should not be seen or managed at an outpatient pain management facility. Communication becomes difficult and requires great effort. Assistance to reach the emergency department may be required. Facial flushing and profuse sweating along with potentially dangerous increases in heart rate and blood pressure will be evident.   Distressing pain 7 Self-care is very difficult. Assistance is required to transport, or use restroom. Assistance to reach the emergency department will be required. Tasks requiring coordination, such as bathing and getting dressed become very difficult.   Disabling pain 8 Self-care is no longer possible. At this level, pain is disabling. The individual is unable to do even the most "basic" activities such as walking, eating, bathing, dressing, transferring to a bed, or toileting. Fine motor skills are lost. It is difficult to think clearly.   Incapacitating pain 9 Pain becomes incapacitating. Thought processing is no longer possible. Difficult to remember your own name. Control of movement and coordination are lost.   The worst pain imaginable 10 At this level, most patients pass out from pain. When this level is reached, collapse of the autonomic nervous system occurs, leading to a sudden drop in blood pressure and heart rate. This in turn results in a temporary and dramatic drop in blood flow to the brain, leading to a loss of consciousness. Fainting is one of the body's self defense mechanisms. Passing out puts the brain in a calmed state and causes it to shut down for a while, in order to begin the healing process.    Summary: 1. Refer to this scale when providing [place] with your pain level. 2. Be accurate and careful when reporting your pain level. This will help with your care. 3. Over-reporting  your pain level will lead to loss of credibility. 4. Even a level of 1/10 means that there is pain and will be treated at our facility. 5. High, inaccurate reporting will be documented as "Symptom Exaggeration", leading to loss of credibility and suspicions of possible secondary gains such as obtaining more narcotics, or wanting to appear disabled, for fraudulent reasons. 6. Only pain levels of 5 or below will be seen at our facility. 7. Pain levels of 6 and above will be sent to the Emergency Department and the appointment cancelled. _____________________________________________________________________________________________  Radiofrequency Lesioning Introduction Radiofrequency lesioning is a procedure that is performed to relieve pain. The procedure is often used for back, neck, or arm pain. Radiofrequency lesioning involves the use of a machine that creates radio waves to make heat. During the procedure, the heat is applied to the nerve that carries the pain signal. The heat damages the nerve and interferes with the pain signal. Pain relief usually starts about 2 weeks after the procedure and lasts for 6 months to 1 year. Tell a health care provider about:  Any allergies you have.  All medicines you are taking, including vitamins, herbs, eye drops, creams, and over-the-counter medicines.  Any problems you or family members have had with anesthetic medicines.  Any blood disorders you have.  Any surgeries you have had.  Any medical conditions you have.  Whether you are pregnant or may be pregnant. What are the risks?  Generally, this is a safe procedure. However, problems may occur, including:  Pain or soreness at the injection site.  Infection at the injection site.  Damage to nerves or blood vessels. What happens before the procedure?  Ask your health care provider about:  Changing or stopping your regular medicines. This is especially important if you are taking diabetes  medicines or blood thinners.  Taking medicines such as aspirin and ibuprofen. These medicines can thin your blood. Do not take these medicines before your procedure if your health care provider instructs you not to.  Follow instructions from your health care provider about eating or drinking restrictions.  Plan to have someone take you home after the procedure.  If you go home right after the procedure, plan to have someone with you for 24 hours. What happens during the procedure?  You will be given one or more of the following:  A medicine to help you relax (sedative).  A medicine to numb the area (local anesthetic).  You will be awake during the procedure. You will need to be able to talk with the health care provider during the procedure.  With the help of a type of X-ray (fluoroscopy), the health care provider will insert a radiofrequency needle into the area to be treated.  Next, a wire that carries the radio waves (electrode) will be put through the radiofrequency needle. An electrical pulse will be sent through the electrode to verify the correct nerve. You will feel a tingling sensation, and you may have muscle twitching.  Then, the tissue that is around the needle tip will be heated by an electric current that is passed using the radiofrequency machine. This will numb the nerves.  A bandage (dressing) will be put on the insertion area after the procedure is done. The procedure may vary among health care providers and hospitals. What happens after the procedure?  Your blood pressure, heart rate, breathing rate, and blood oxygen level will be monitored often until the medicines you were given have worn off.  Return to your normal activities as directed by your health care provider. This information is not intended to replace advice given to you by your health care provider. Make sure you discuss any questions you have with your health care provider. Document Released:  03/03/2011 Document Revised: 12/11/2015 Document Reviewed: 08/12/2014  2017 [employer] Radiofrequency Lesioning, Care After Introduction Refer to this sheet in the next few weeks. These instructions provide you with information about caring for yourself after your procedure. Your health care provider may also give you more specific instructions. Your treatment has been planned according to current medical practices, but problems sometimes occur. Call your health care provider if you have any problems or questions after your procedure. What can I expect after the procedure? After the procedure, it is common to have:  Pain from the burned nerve.  Temporary numbness. Follow these instructions at home:  Take over-the-counter and prescription medicines only as told by your health care provider.  Return to your normal activities as told by your health care provider. Ask your health care provider what activities are safe for you.  Pay close attention to how you feel after the procedure. If you start to have pain, write down when it hurts and how it feels. This will help you and your health care provider to know if you need an additional treatment.  Check your needle insertion site every day for signs of infection. Watch for:  Redness, swelling, or pain.  Fluid, blood, or pus.  Keep all follow-up visits as told by your health care provider. This is important. Contact a health care provider if:  Your pain does not get better.  You have redness, swelling, or pain at the needle insertion site.  You have fluid, blood, or pus coming from the needle insertion site.  You have a fever. Get help right away if:  You develop sudden, severe pain.  You develop numbness or tingling near the procedure site that does not go away. This information is not intended to replace advice given to you by your health care provider. Make sure you discuss any questions you have with your health care  provider. Document Released: 03/04/2011 Document Revised: 12/11/2015 Document Reviewed: 08/12/2014  2017 Elsevier Pain Management Discharge Instructions  General Discharge Instructions :  If you need to reach your doctor call: Monday-Friday 8:00 am - 4:00 pm at [phone] or toll free [phone].  After clinic hours [phone] to have operator reach doctor.  Bring all of your medication bottles to all your appointments in the pain clinic.  To cancel or reschedule your appointment with Pain Management please remember to call 24 hours in advance to avoid a fee.  Refer to the educational materials which you have been given on: General Risks, I had my Procedure. Discharge Instructions, Post Sedation.  Post Procedure Instructions:  The drugs you were given will stay in your system until tomorrow, so for the next 24 hours you should not drive, make any legal decisions or drink any alcoholic beverages.  You may eat anything you prefer, but it is better to start with liquids then soups and crackers, and gradually work up to solid foods.  Please notify your doctor immediately if you have any unusual bleeding, trouble breathing or pain that is not related to your normal pain.  Depending on the type of procedure that was done, some parts of your body may feel week and/or numb.  This usually clears up by tonight or the next day.  Walk with the use of an assistive device or accompanied by an adult for the 24 hours.  You may use ice on the affected area for the first 24 hours.  Put ice in a Ziploc bag and cover with a towel and place against area 15 minutes on 15 minutes off.  You may switch to heat after 24 hours.Facet Joint Block The facet joints connect the bones of the spine (vertebrae). They make it possible for you to bend, twist, and make other movements with your spine. They also prevent you from overbending, overtwisting, and making other excessive movements.  A facet joint block is  a procedure where a numbing medicine (anesthetic) is injected into a facet joint. Often, a type of anti-inflammatory medicine called a steroid is also injected. A facet joint block may be done for two reasons:   Diagnosis. A facet joint block may be done as a test to see whether neck or back pain is caused by a worn-down or infected facet joint. If the pain gets better after a facet joint block, it means the pain is probably coming from the facet joint. If the pain does not get better, it means the pain is probably not coming from the facet joint.   Therapy. A facet joint block may be done to relieve neck or back pain caused by a facet joint. A facet joint block is only done as a therapy if the pain does not improve with medicine, exercise  programs, physical therapy, and other forms of pain management. LET [facility] CARE PROVIDER KNOW ABOUT:   Any allergies you have.   All medicines you are taking, including vitamins, herbs, eyedrops, and over-the-counter medicines and creams.   Previous problems you or members of your family have had with the use of anesthetics.   Any blood disorders you have had.   Other health problems you have. RISKS AND COMPLICATIONS Generally, having a facet joint block is safe. However, as with any procedure, complications can occur. Possible complications associated with having a facet joint block include:   Bleeding.   Injury to a nerve near the injection site.   Pain at the injection site.   Weakness or numbness in areas controlled by nerves near the injection site.   Infection.   Temporary fluid retention.   Allergic reaction to anesthetics or medicines used during the procedure. BEFORE THE PROCEDURE   Follow your health care provider's instructions if you are taking dietary supplements or medicines. You may need to stop taking them or reduce your dosage.   Do not take any new dietary supplements or medicines without asking your health  care provider first.   Follow your health care provider's instructions about eating and drinking before the procedure. You may need to stop eating and drinking several hours before the procedure.   Arrange to have an adult drive you home after the procedure. PROCEDURE  You may need to remove your clothing and dress in an open-back gown so that your health care provider can access your spine.   The procedure will be done while you are lying on an X-ray table. Most of the time you will be asked to lie on your stomach, but you may be asked to lie in a different position if an injection will be made in your neck.   Special machines will be used to monitor your oxygen levels, heart rate, and blood pressure.   If an injection will be made in your neck, an intravenous (IV) tube will be inserted into one of your veins. Fluids and medicine will flow directly into your body through the IV tube.   The area over the facet joint where the injection will be made will be cleaned with an antiseptic soap. The surrounding skin will be covered with sterile drapes.   An anesthetic will be applied to your skin to make the injection area numb. You may feel a temporary stinging or burning sensation.   A video X-ray machine will be used to locate the joint. A contrast dye may be injected into the facet joint area to help with locating the joint.   When the joint is located, an anesthetic medicine will be injected into the joint through the needle.   Your health care provider will ask you whether you feel pain relief. If you do feel relief, a steroid may be injected to provide pain relief for a longer period of time. If you do not feel relief or feel only partial relief, additional injections of an anesthetic may be made in other facet joints.   The needle will be removed, the skin will be cleansed, and bandages will be applied.  AFTER THE PROCEDURE   You will be observed for 15-30 minutes before  being allowed to go home. Do not drive. Have an adult drive you or take a taxi or public transportation instead.   If you feel pain relief, the pain will return in several hours or days when  the anesthetic wears off.   You may feel pain relief 2-14 days after the procedure. The amount of time this relief lasts varies from person to person.   It is normal to feel some tenderness over the injected area(s) for 2 days following the procedure.   If you have diabetes, you may have a temporary increase in blood sugar. This information is not intended to replace advice given to you by your health care provider. Make sure you discuss any questions you have with your health care provider. Document Released: 11/24/2006 Document Revised: 07/26/2014 Document Reviewed: 03/31/2015 Elsevier Interactive Patient Education  2017 [employer] Facet Joint Block, Care After Refer to this sheet in the next few weeks. These instructions provide you with information on caring for yourself after your procedure. Your health care provider may also give you more specific instructions. Your treatment has been planned according to current medical practices, but problems sometimes occur. Call your health care provider if you have any problems or questions after your procedure. HOME CARE INSTRUCTIONS   Keep track of the amount of pain relief you feel and how long it lasts.  Limit pain medicine within the first 4-6 hours after the procedure as directed by your health care provider.  Resume taking dietary supplements and medicines as directed by your health care provider.  You may resume your regular diet.  Do not apply heat near or over the injection site(s) for 24 hours.   Do not take a bath or soak in water (such as a pool or lake) for 24 hours.  Do not drive for 24 hours unless approved by your health care provider.  Avoid strenuous activity for 24 hours.  Remove your bandages the morning after the  procedure.   If the injection site is tender, applying an ice pack may relieve some tenderness. To do this:  Put ice in a bag.  Place a towel between your skin and the bag.  Leave the ice on for 15-20 minutes, 3-4 times a day.  Keep follow-up appointments as directed by your health care provider. SEEK MEDICAL CARE IF:   Your pain is not controlled by your medicines.   There is drainage from the injection site.   There is significant bleeding or swelling at the injection site.  You have diabetes and your blood sugar is above 180 mg/dL. SEEK IMMEDIATE MEDICAL CARE IF:   You develop a fever of 101F (38.3C) or greater.   You have worsening pain or swelling around the injection site.   You have red streaking around the injection site.   You develop severe pain that is not controlled by your medicines.   You develop a headache, stiff neck, nausea, or vomiting.   Your eyes become very sensitive to light.   You have weakness, paralysis, or tingling in your arms or legs that was not present before the procedure.   You develop difficulty urinating or breathing.  This information is not intended to replace advice given to you by your health care provider. Make sure you discuss any questions you have with your health care provider. Document Released: 06/21/2012 Document Revised: 07/26/2014 Document Reviewed: 03/31/2015 Elsevier Interactive Patient Education  2017 [employer].

## 2016-09-13 ENCOUNTER — Encounter: Payer: Self-pay | Admitting: *Deleted

## 2016-09-13 ENCOUNTER — Ambulatory Visit: Payer: Medicaid Other | Admitting: Cardiovascular Disease

## 2016-09-14 ENCOUNTER — Telehealth: Payer: Self-pay | Admitting: Pain Medicine

## 2016-09-14 LAB — AEROBIC/ANAEROBIC CULTURE (SURGICAL/DEEP WOUND): CULTURE: NO GROWTH

## 2016-09-14 LAB — AEROBIC/ANAEROBIC CULTURE W GRAM STAIN (SURGICAL/DEEP WOUND)

## 2016-09-14 NOTE — Telephone Encounter (Addendum)
[facility] med[facility] will not pay for capsule form of medication, but if physician will write for caplet form they will fill the med, the muscle relaxer. Please let patient know when she can pick up meds Also she is getting 15 teeth pulled tomorrow and wants to know if she is ok to get additional pain meds from Dentist to help for couple days of increased pain

## 2016-09-14 NOTE — Telephone Encounter (Signed)
Dena called and changed capsules to tablets. Patient instructed that dentist could prescribe pain meds but that dentist and patient need to let us know what was prescribed.

## 2016-09-20 DIAGNOSIS — R0600 Dyspnea, unspecified: Secondary | ICD-10-CM | POA: Insufficient documentation

## 2016-09-21 ENCOUNTER — Other Ambulatory Visit: Payer: Self-pay | Admitting: Family Medicine

## 2016-09-21 DIAGNOSIS — R188 Other ascites: Secondary | ICD-10-CM

## 2016-09-23 ENCOUNTER — Other Ambulatory Visit: Payer: Self-pay | Admitting: Pain Medicine

## 2016-09-23 DIAGNOSIS — R252 Cramp and spasm: Secondary | ICD-10-CM

## 2016-09-23 DIAGNOSIS — M1732 Unilateral post-traumatic osteoarthritis, left knee: Secondary | ICD-10-CM

## 2016-09-23 DIAGNOSIS — M62830 Muscle spasm of back: Secondary | ICD-10-CM

## 2016-09-23 NOTE — Progress Notes (Signed)
Cardiology Office Note  Date:  09/24/2016   ID:  Erica [name], DOB [birth year], MRN [id number]  PCP:  [facility]   Chief Complaint  Patient presents with  . other    Follow up due to CHF. Meds reviewed by the pt. verbally. Pt. c/o edema in LE, fingers and abdomin and shortness of breath.      HPI:  51 y.o.femalewith h/o smoking, COPD, on inhalers, medication noncompliance secondary to financial and insurance issues, history of chronic thrush, chronic back pain who presented to the hospital in late November 2016 with shortness of breath, Diagnosed with pneumonia, echocardiogram showing normal LV function, elevated right heart pressures,  sodium around 112, abnormal troponin, long hospital course She presents today for follow-up of her chronic diastolic CHF She reports that she stopped smoking several months ago  In follow-up today, she reports having weight gain, worsening shortness of breath Seen in the ER 06/14/16 with shortness of breath symptoms, given IV Lasix She is unclear if this helped her symptoms, may be to some mild degree She is not taking Lasix at the time. Since then was taking Lasix daily Followed by CHF clinic in  Now up to lasix 40 BID for >10 pounds  She denies eating excessive amounts, will reports abdominal swelling over the past 4-12 months She uses Oxygen at night  She has had several Prednisone tapers, most recently in jan 2018,  OSA, does not use CPAP. Had problems with anxiety  Continues to take Advair, spiriva and proair Problems with thrush  CT scan chest  Oct 2017  Previous CT 06/2015 No significant CAD, PAD, minimal in aortic arch Images reviewed with her in detail   Recent EKG reviewed from 08/31/2016 when she saw CHF clinic showing normal sinus rhythm rate 66 bpm no significant ST or T-wave changes  PMH:   has a past medical history of Arthritis; Asthma; CHF (congestive heart failure) (HCC); COPD (chronic obstructive pulmonary  disease) (HCC); Gastric ulcer; Hyperlipidemia; Hypertension; and OSA on CPAP (no ins - no cpap machine for the last 4 years).  PSH:    Past Surgical History:  Procedure Laterality Date  . FLEXIBLE BRONCHOSCOPY N/A 06/20/2015   Procedure: FLEXIBLE BRONCHOSCOPY;  Surgeon: Shane CrutchPradeep Ramachandran, MD;  Location: [facility] ORS;  Service: Pulmonary;  Laterality: N/A;  . KNEE SURGERY Left    8 knee surgeries    Current Outpatient Prescriptions  Medication Sig Dispense Refill  . albuterol (PROAIR HFA) 108 (90 Base) MCG/ACT inhaler Inhale into the lungs 4 (four) times daily.    Marland Kitchen. aspirin EC 81 MG EC tablet Take 1 tablet (81 mg total) by mouth daily. 30 tablet 0  . atorvastatin (LIPITOR) 40 MG tablet Take 40 mg by mouth daily.    . Cholecalciferol (VITAMIN D3) 2000 units capsule Take 1 capsule (2,000 Units total) by mouth daily. 30 capsule PRN  . diltiazem (CARDIZEM) 30 MG tablet Take 1 tablet (30 mg total) by mouth 3 (three) times daily as needed. 90 tablet 3  . DULoxetine (CYMBALTA) 60 MG capsule Take 60 mg by mouth daily.    . Fluticasone-Salmeterol (ADVAIR DISKUS IN) Inhale into the lungs 2 (two) times daily.    . folic acid (FOLVITE) 1 MG tablet Take 1 mg by mouth daily.    . furosemide (LASIX) 40 MG tablet Take 40 mg by mouth 2 (two) times daily.    Melene Muller. [START ON 09/30/2016] HYDROcodone-acetaminophen (NORCO/VICODIN) 5-325 MG tablet Take 1 tablet by mouth every 12 (twelve)  hours as needed for moderate pain. 60 tablet 0  . Magnesium Oxide 500 MG CAPS Take 1 capsule (500 mg total) by mouth 2 (two) times daily at 8 am and 10 pm. 30 capsule 0  . OXYGEN Inhale into the lungs. 2 liters at bedtime    . pantoprazole (PROTONIX) 40 MG tablet Take 1 tablet (40 mg total) by mouth daily. 30 tablet 0  . potassium chloride SA (K-DUR,KLOR-CON) 20 MEQ tablet Take 20 mEq by mouth 2 (two) times daily.    . pregabalin (LYRICA) 75 MG capsule Take 1 capsule (75 mg total) by mouth 3 (three) times daily. 90 capsule 0  .  sucralfate (CARAFATE) 1 G tablet Take 1 tablet (1 g total) by mouth 4 (four) times daily -  with meals and at bedtime. 120 tablet 0  . tiotropium (SPIRIVA HANDIHALER) 18 MCG inhalation capsule Place 18 mcg into inhaler and inhale daily.    Marland Kitchen tiZANidine (ZANAFLEX) 4 MG capsule Take 1 capsule (4 mg total) by mouth 3 (three) times daily as needed for muscle spasms. 90 capsule 0   No current facility-administered medications for this visit.      Allergies:   Tramadol and Penicillins   Social History:  The patient  reports that she quit smoking about 15 months ago. Her smoking use included Cigarettes. She has a 36.00 pack-year smoking history. She has never used smokeless tobacco. She reports that she drinks alcohol. She reports that she does not use drugs.   Family History:   family history includes Breast cancer in her mother; Diabetes in her maternal grandmother; Lung cancer in her father.    Review of Systems: Review of Systems  Constitutional: Negative.        Weight gain  Respiratory: Positive for cough and shortness of breath.   Cardiovascular: Negative.   Gastrointestinal: Negative.        Abdominal distention  Musculoskeletal: Negative.   Neurological: Negative.   Psychiatric/Behavioral: Negative.   All other systems reviewed and are negative.    PHYSICAL EXAM: VS:  BP 140/82 (BP Location: Left Arm, Patient Position: Sitting, Cuff Size: Normal)   Pulse 81   Ht 5\' 8"  (1.727 m)   Wt 207 lb 8 oz (94.1 kg)   LMP 03/13/2015 (Approximate) Comment: states she is going through menopause  SpO2 98%   BMI 31.55 kg/m  , BMI Body mass index is 31.55 kg/m. GEN: Well nourished, well developed, in no acute distress, obese  HEENT: normal  Neck: no JVD, carotid bruits, or masses Cardiac: RRR; no murmurs, rubs, or gallops,no edema  Respiratory:  Mildly decreased breath sounds throughout, normal work of breathing GI: soft, nontender, nondistended, + BS MS: no deformity or atrophy   Skin: warm and dry, no rash Neuro:  Strength and sensation are intact Psych: euthymic mood, full affect    Recent Labs: 04/30/2016: Magnesium 1.9 06/14/2016: ALT 26; B Natriuretic Peptide 42.0; BUN 14; Creatinine, Ser 0.95; Hemoglobin 13.9; Platelets 249; Potassium 3.4; Sodium 136    Lipid Panel Lab Results  Component Value Date   CHOL 177 06/14/2015   HDL 25 (L) 06/14/2015   LDLCALC 113 (H) 06/14/2015   TRIG 193 (H) 06/14/2015      Wt Readings from Last 3 Encounters:  09/24/16 207 lb 8 oz (94.1 kg)  09/08/16 190 lb (86.2 kg)  08/31/16 199 lb 4 oz (90.4 kg)       ASSESSMENT AND PLAN:  Chronic diastolic heart failure (HCC) Weight gain of  uncertain etiology Recently increased up to Lasix 40 mg twice a day We'll wait for BMP, BNP results to come back She has high fluid intake, high salt intake Recommended she cut back on her fluids  Smoker Reports that she stopped smoking several months ago  Palpitations Rare episodes of palpitations. Normal sinus rhythm on today's visit. If symptoms get worse could order a event monitor  Simple chronic bronchitis (HCC) Take cough on today's visit, suspect chronic bronchitis  Pulmonary HTN Prior echocardiogram November 2016 with moderately elevated right heart pressures If there is any question concerning her fluid status, repeat echocardiogram could be ordered  Shortness of breath Likely multifactorial including severe COPD, obesity, deconditioning, diastolic CHF/pulmonary hypertension  COPD (chronic obstructive pulmonary disease) with acute bronchitis (HCC) Currently uses  steroid inhalers, albuterol Given the severity of her lungs, may need albuterol nebulizers  Obstructive sleep apnea Unable to tolerate CPAP, was anxious  Morbid obesity (HCC) We have encouraged careful diet management in an effort to lose weight.   Total encounter time more than 45 minutes  Greater than 50% was spent in counseling and coordination  of care with the patient   Disposition:   F/U  6 months  No orders of the defined types were placed in this encounter.    Signed, Dossie Arbour, M.D., Ph.D. 09/24/2016  [facility] [place], [place] [phone]

## 2016-09-24 ENCOUNTER — Encounter: Payer: Self-pay | Admitting: Cardiovascular Disease

## 2016-09-24 ENCOUNTER — Ambulatory Visit (INDEPENDENT_AMBULATORY_CARE_PROVIDER_SITE_OTHER): Payer: Medicaid Other | Admitting: Cardiovascular Disease

## 2016-09-24 VITALS — BP 140/82 | HR 81 | Ht 68.0 in | Wt 207.5 lb

## 2016-09-24 DIAGNOSIS — R002 Palpitations: Secondary | ICD-10-CM

## 2016-09-24 DIAGNOSIS — I272 Pulmonary hypertension, unspecified: Secondary | ICD-10-CM

## 2016-09-24 DIAGNOSIS — R0602 Shortness of breath: Secondary | ICD-10-CM

## 2016-09-24 DIAGNOSIS — J209 Acute bronchitis, unspecified: Secondary | ICD-10-CM | POA: Diagnosis not present

## 2016-09-24 DIAGNOSIS — F17200 Nicotine dependence, unspecified, uncomplicated: Secondary | ICD-10-CM

## 2016-09-24 DIAGNOSIS — G4733 Obstructive sleep apnea (adult) (pediatric): Secondary | ICD-10-CM

## 2016-09-24 DIAGNOSIS — J410 Simple chronic bronchitis: Secondary | ICD-10-CM

## 2016-09-24 DIAGNOSIS — I5032 Chronic diastolic (congestive) heart failure: Secondary | ICD-10-CM

## 2016-09-24 DIAGNOSIS — J440 Chronic obstructive pulmonary disease with acute lower respiratory infection: Secondary | ICD-10-CM

## 2016-09-24 NOTE — Patient Instructions (Addendum)
Medication Instructions:   Stay on lasix twice a day   Labwork:  No new labs needed  Testing/Procedures:  No further testing at this time   I recommend watching educational videos on topics of interest to you at:       [email or web address]  Enter code: [email or web address]    Follow-Up: It was a pleasure seeing you in the office today. Please call us if you have new issues that need to be addressed before your next appt.  [phone]  Your physician wants you to follow-up in: 3 month.   If you need a refill on your cardiac medications before your next appointment, please call your pharmacy.

## 2016-09-27 ENCOUNTER — Ambulatory Visit
Admission: RE | Admit: 2016-09-27 | Discharge: 2016-09-27 | Disposition: A | Discharge status: Home / Self Care | Payer: Medicaid Other | Source: Ambulatory Visit | Attending: Family Medicine | Admitting: Family Medicine

## 2016-09-27 DIAGNOSIS — K8020 Calculus of gallbladder without cholecystitis without obstruction: Secondary | ICD-10-CM | POA: Diagnosis not present

## 2016-09-27 DIAGNOSIS — R188 Other ascites: Secondary | ICD-10-CM | POA: Diagnosis present

## 2016-09-27 DIAGNOSIS — K760 Fatty (change of) liver, not elsewhere classified: Secondary | ICD-10-CM | POA: Insufficient documentation

## 2016-09-29 ENCOUNTER — Ambulatory Visit (HOSPITAL_BASED_OUTPATIENT_CLINIC_OR_DEPARTMENT_OTHER): Payer: Medicaid Other | Admitting: Pain Medicine

## 2016-09-29 ENCOUNTER — Ambulatory Visit
Admission: RE | Admit: 2016-09-29 | Discharge: 2016-09-29 | Disposition: A | Discharge status: Home / Self Care | Payer: Medicaid Other | Source: Ambulatory Visit | Attending: Pain Medicine | Admitting: Pain Medicine

## 2016-09-29 ENCOUNTER — Encounter: Payer: Self-pay | Admitting: Pain Medicine

## 2016-09-29 VITALS — BP 153/87 | HR 73 | Temp 97.8°F | Resp 16 | Ht 68.0 in | Wt 205.0 lb

## 2016-09-29 DIAGNOSIS — M545 Low back pain: Secondary | ICD-10-CM

## 2016-09-29 DIAGNOSIS — M47816 Spondylosis without myelopathy or radiculopathy, lumbar region: Secondary | ICD-10-CM | POA: Insufficient documentation

## 2016-09-29 DIAGNOSIS — G8929 Other chronic pain: Secondary | ICD-10-CM | POA: Insufficient documentation

## 2016-09-29 DIAGNOSIS — M1288 Other specific arthropathies, not elsewhere classified, other specified site: Secondary | ICD-10-CM | POA: Diagnosis not present

## 2016-09-29 DIAGNOSIS — G8918 Other acute postprocedural pain: Secondary | ICD-10-CM

## 2016-09-29 MED ORDER — FENTANYL CITRATE (PF) 100 MCG/2ML IJ SOLN
INTRAMUSCULAR | Status: AC
  Filled 2016-09-29: qty 2

## 2016-09-29 MED ORDER — FENTANYL CITRATE (PF) 100 MCG/2ML IJ SOLN
25.0000 ug | INTRAMUSCULAR | Status: DC | PRN
  Administered 2016-09-29: 100 ug via INTRAVENOUS

## 2016-09-29 MED ORDER — LACTATED RINGERS IV SOLN
1000.0000 mL | Freq: Once | INTRAVENOUS | Status: AC
  Administered 2016-09-29: 1000 mL via INTRAVENOUS

## 2016-09-29 MED ORDER — ROPIVACAINE HCL 5 MG/ML IJ SOLN
5.0000 mL | Freq: Once | INTRAMUSCULAR | Status: AC
  Administered 2016-09-29: 5 mL via PERINEURAL

## 2016-09-29 MED ORDER — LIDOCAINE HCL (PF) 1 % IJ SOLN: 10.0000 mL | Freq: Once | INTRAMUSCULAR | Status: DC

## 2016-09-29 MED ORDER — TRIAMCINOLONE ACETONIDE 40 MG/ML IJ SUSP
INTRAMUSCULAR | Status: AC
  Filled 2016-09-29: qty 1

## 2016-09-29 MED ORDER — OXYCODONE-ACETAMINOPHEN 5-325 MG PO TABS: 1.0000 | ORAL_TABLET | Freq: Four times a day (QID) | ORAL | 0 refills | Status: DC | PRN

## 2016-09-29 MED ORDER — MIDAZOLAM HCL 5 MG/5ML IJ SOLN
INTRAMUSCULAR | Status: AC
  Filled 2016-09-29: qty 5

## 2016-09-29 MED ORDER — TRIAMCINOLONE ACETONIDE 40 MG/ML IJ SUSP
40.0000 mg | Freq: Once | INTRAMUSCULAR | Status: AC
  Administered 2016-09-29: 40 mg

## 2016-09-29 MED ORDER — ROPIVACAINE HCL 5 MG/ML IJ SOLN
INTRAMUSCULAR | Status: AC
  Filled 2016-09-29: qty 20

## 2016-09-29 MED ORDER — MIDAZOLAM HCL 5 MG/5ML IJ SOLN
1.0000 mg | INTRAMUSCULAR | Status: DC | PRN
Start: 2016-09-29 — End: 2016-09-29
  Administered 2016-09-29: 3 mg via INTRAVENOUS

## 2016-09-29 NOTE — Patient Instructions (Addendum)
Post-Procedure instructions Instructions:  Apply ice: Fill a plastic sandwich bag with crushed ice. Cover it with a small towel and apply to injection site. Apply for 15 minutes then remove x 15 minutes. Repeat sequence on day of procedure, until you go to bed. The purpose is to minimize swelling and discomfort after procedure.  Apply heat: Apply heat to procedure site starting the day following the procedure. The purpose is to treat any soreness and discomfort from the procedure.  Food intake: Start with clear liquids (like water) and advance to regular food, as tolerated.   Physical activities: Keep activities to a minimum for the first 8 hours after the procedure.   Driving: If you have received any sedation, you are not allowed to drive for 24 hours after your procedure.  Blood thinner: Restart your blood thinner 6 hours after your procedure. (Only for those taking blood thinners)  Insulin: As soon as you can eat, you may resume your normal dosing schedule. (Only for those taking insulin)  Infection prevention: Keep procedure site clean and dry.  Post-procedure Pain Diary: Extremely important that this be done correctly and accurately. Recorded information will be used to determine the next step in treatment.  Pain evaluated is that of treated area only. Do not include pain from an untreated area.  Complete every hour, on the hour, for the initial 8 hours. Set an alarm to help you do this part accurately.  Do not go to sleep and have it completed later. It will not be accurate.  Follow-up appointment: Keep your follow-up appointment after the procedure. Usually 2 weeks for most procedures. (6 weeks in the case of radiofrequency.) Bring you pain diary.  Expect:  From numbing medicine (AKA: Local Anesthetics): Numbness or decrease in pain.  Onset: Full effect within 15 minutes of injected.  Duration: It will depend on the type of local anesthetic used. On the average, 1 to 8  hours.   From steroids: Decrease in swelling or inflammation. Once inflammation is improved, relief of the pain will follow.  Onset of benefits: Depends on the amount of swelling present. The more swelling, the longer it will take for the benefits to be seen.   Duration: Steroids will stay in the system x 2 weeks. Duration of benefits will depend on multiple posibilities including persistent irritating factors.  From procedure: Some discomfort is to be expected once the numbing medicine wears off. This should be minimal if ice and heat are applied as instructed. Call if:  You experience numbness and weakness that gets worse with time, as opposed to wearing off.  New onset bowel or bladder incontinence. (Spinal procedures only)  Emergency Numbers:  Durning business hours (Monday - Thursday, 8:00 AM - 4:00 PM) (Friday, 9:00 AM - 12:00 Noon): [phone]  After hours: [phone]   __________________________________________________________________________________________   Pain Score  Introduction: The pain score used by this practice is the Verbal Numerical Rating Scale (VNRS-11). This is an 11-point scale. It is for adults and children 10 years or older. There are significant differences in how the pain score is reported, used, and applied. Forget everything you learned in the past and learn this scoring system.  General Information: The scale should reflect your current level of pain. Unless you are specifically asked for the level of your worst pain, or your average pain. If you are asked for one of these two, then it should be understood that it is over the past 24 hours.  Basic Activities   of Daily Living (ADL): Personal hygiene, dressing, eating, transferring, and using restroom.  Instructions: Most patients tend to report their level of pain as a combination of two factors, their physical pain and their psychosocial pain. This last one is also known as "suffering" and it is  reflection of how physical pain affects you socially and psychologically. From now on, report them separately. From this point on, when asked to report your pain level, report only your physical pain. Use the following table for reference.  Pain Clinic Pain Levels (0-5/10)  Pain Level Score Description  No Pain 0   Mild pain 1 Nagging, annoying, but does not interfere with basic activities of daily living (ADL). Patients are able to eat, bathe, get dressed, toileting (being able to get on and off the toilet and perform personal hygiene functions), transfer (move in and out of bed or a chair without assistance), and maintain continence (able to control bladder and bowel functions). Blood pressure and heart rate are unaffected. A normal heart rate for a healthy adult ranges from 60 to 100 bpm (beats per minute).   Mild to moderate pain 2 Noticeable and distracting. Impossible to hide from other people. More frequent flare-ups. Still possible to adapt and function close to normal. It can be very annoying and may have occasional stronger flare-ups. With discipline, patients may get used to it and adapt.   Moderate pain 3 Interferes significantly with activities of daily living (ADL). It becomes difficult to feed, bathe, get dressed, get on and off the toilet or to perform personal hygiene functions. Difficult to get in and out of bed or a chair without assistance. Very distracting. With effort, it can be ignored when deeply involved in activities.   Moderately severe pain 4 Impossible to ignore for more than a few minutes. With effort, patients may still be able to manage work or participate in some social activities. Very difficult to concentrate. Signs of autonomic nervous system discharge are evident: dilated pupils (mydriasis); mild sweating (diaphoresis); sleep interference. Heart rate becomes elevated (>115 bpm). Diastolic blood pressure (lower number) rises above 100 mmHg. Patients find relief in  laying down and not moving.   Severe pain 5 Intense and extremely unpleasant. Associated with frowning face and frequent crying. Pain overwhelms the senses.  Ability to do any activity or maintain social relationships becomes significantly limited. Conversation becomes difficult. Pacing back and forth is common, as getting into a comfortable position is nearly impossible. Pain wakes you up from deep sleep. Physical signs will be obvious: pupillary dilation; increased sweating; goosebumps; brisk reflexes; cold, clammy hands and feet; nausea, vomiting or dry heaves; loss of appetite; significant sleep disturbance with inability to fall asleep or to remain asleep. When persistent, significant weight loss is observed due to the complete loss of appetite and sleep deprivation.  Blood pressure and heart rate becomes significantly elevated. Caution: If elevated blood pressure triggers a pounding headache associated with blurred vision, then the patient should immediately seek attention at an urgent or emergency care unit, as these may be signs of an impending stroke.    Emergency Department Pain Levels (6-10/10)  Emergency Room Pain 6 Severely limiting. Requires emergency care and should not be seen or managed at an outpatient pain management facility. Communication becomes difficult and requires great effort. Assistance to reach the emergency department may be required. Facial flushing and profuse sweating along with potentially dangerous increases in heart rate and blood pressure will be evident.   Distressing pain 7   Self-care is very difficult. Assistance is required to transport, or use restroom. Assistance to reach the emergency department will be required. Tasks requiring coordination, such as bathing and getting dressed become very difficult.   Disabling pain 8 Self-care is no longer possible. At this level, pain is disabling. The individual is unable to do even the most "basic" activities such as  walking, eating, bathing, dressing, transferring to a bed, or toileting. Fine motor skills are lost. It is difficult to think clearly.   Incapacitating pain 9 Pain becomes incapacitating. Thought processing is no longer possible. Difficult to remember your own name. Control of movement and coordination are lost.   The worst pain imaginable 10 At this level, most patients pass out from pain. When this level is reached, collapse of the autonomic nervous system occurs, leading to a sudden drop in blood pressure and heart rate. This in turn results in a temporary and dramatic drop in blood flow to the brain, leading to a loss of consciousness. Fainting is one of the body's self defense mechanisms. Passing out puts the brain in a calmed state and causes it to shut down for a while, in order to begin the healing process.    Summary: 1. Refer to this scale when providing us with your pain level. 2. Be accurate and careful when reporting your pain level. This will help with your care. 3. Over-reporting your pain level will lead to loss of credibility. 4. Even a level of 1/10 means that there is pain and will be treated at our facility. 5. High, inaccurate reporting will be documented as "Symptom Exaggeration", leading to loss of credibility and suspicions of possible secondary gains such as obtaining more narcotics, or wanting to appear disabled, for fraudulent reasons. 6. Only pain levels of 5 or below will be seen at our facility. 7. Pain levels of 6 and above will be sent to the Emergency Department and the appointment cancelled. _____________________________________________________________________________________________  Pain Management Discharge Instructions  General Discharge Instructions :  If you need to reach your doctor call: Monday-Friday 8:00 am - 4:00 pm at [phone] or toll free [phone].  After clinic hours [phone] to have operator reach doctor.  Bring all of your  medication bottles to all your appointments in the pain clinic.  To cancel or reschedule your appointment with Pain Management please remember to call 24 hours in advance to avoid a fee.  Refer to the educational materials which you have been given on: General Risks, I had my Procedure. Discharge Instructions, Post Sedation.  Post Procedure Instructions:  The drugs you were given will stay in your system until tomorrow, so for the next 24 hours you should not drive, make any legal decisions or drink any alcoholic beverages.  You may eat anything you prefer, but it is better to start with liquids then soups and crackers, and gradually work up to solid foods.  Please notify your doctor immediately if you have any unusual bleeding, trouble breathing or pain that is not related to your normal pain.  Depending on the type of procedure that was done, some parts of your body may feel week and/or numb.  This usually clears up by tonight or the next day.  Walk with the use of an assistive device or accompanied by an adult for the 24 hours.  You may use ice on the affected area for the first 24 hours.  Put ice in a Ziploc bag and cover with a towel and place against area   15 minutes on 15 minutes off.  You may switch to heat after 24 hours. 

## 2016-09-29 NOTE — Progress Notes (Signed)
Safety precautions to be maintained throughout the outpatient stay will include: orient to surroundings, keep bed in low position, maintain call bell within reach at all times, provide assistance with transfer out of bed and ambulation.  

## 2016-09-29 NOTE — Progress Notes (Signed)
Patient's Name: Erica [name]  MRN: [id number]  Referring Provider: Delano MetzNaveira, Cheila Wickstrom, MD  DOB: 02/25/1966  PCP: Hillery AldoSarah Patel, MD  DOS: 09/29/2016  Note by: Sydnee LevansFrancisco A. Laban EmperorNaveira, MD  Service setting: Ambulatory outpatient  Location: [facility] (AMB) Pain Management Facility  Visit type: Procedure  Specialty: Interventional Pain Management  Patient type: Established   Primary Reason for Visit: Interventional Pain Management Treatment. CC: Back Pain (lower)  Procedure:  Anesthesia, Analgesia, Anxiolysis:  Type: Therapeutic Medial Branch Facet Radiofrequency Ablation Region: Lumbar Level: L2, L3, L4, L5, & S1 Medial Branch Level(s) Laterality: Left-Sided  Type: Local Anesthesia with Moderate (Conscious) Sedation Local Anesthetic: Lidocaine 1% Route: Intravenous (IV) IV Access: Secured Sedation: Meaningful verbal contact was maintained at all times during the procedure  Indication(s): Analgesia and Anxiety  Indications: 1. Lumbar facet syndrome (Bilateral) (R>L)   2. Lumbar spondylosis   3. Chronic low back pain (Location of Primary Source of Pain) (Bilateral) (L>R)   4. Acute postoperative pain    [name] has either failed to respond, was unable to tolerate, or simply did not get enough benefit from other more conservative therapies including, but not limited to: 1. Over-the-counter medications 2. Anti-inflammatory medications 3. Muscle relaxants 4. Membrane stabilizers 5. Opioids 6. Physical therapy 7. Modalities (Heat, ice, etc.) 8. Invasive techniques such as nerve blocks. [name] has attained more than 50% relief of the pain from a series of diagnostic injections conducted in separate occasions.  Pain Score: Pre-procedure: 8 /10  Post-procedure: 0-No pain/10  Pre-op Assessment:  Previous date of service: 09/08/16 Service provided: Med Refill [name] is a 51 y.o. (year old), female patient, seen today for interventional treatment. She  has a past surgical history that  includes Knee surgery (Left) and Flexible bronchoscopy (N/A, 06/20/2015). Her primarily concern today is the Back Pain (lower)  Initial Vital Signs: Blood pressure (!) 147/79, pulse 73, temperature 98.9 F (37.2 C), temperature source Oral, resp. rate 18, height 5\' 8"  (1.727 m), weight 205 lb (93 kg), last menstrual period 03/13/2015, SpO2 98 %. BMI: 31.17 kg/m  Risk Assessment: Allergies: Reviewed. She is allergic to tramadol and penicillins.  Allergy Precautions: None required Coagulopathies: "Reviewed. None identified.  Blood-thinner therapy: None at this time Active Infection(s): Reviewed. None identified. [name] is afebrile  Site Confirmation: [name] was asked to confirm the procedure and laterality before marking the site Procedure checklist: Completed Consent: Before the procedure and under the influence of no sedative(s), amnesic(s), or anxiolytics, the patient was informed of the treatment options, risks and possible complications. To fulfill our ethical and legal obligations, as recommended by the American Medical Association's Code of Ethics, I have informed the patient of my clinical impression; the nature and purpose of the treatment or procedure; the risks, benefits, and possible complications of the intervention; the alternatives, including doing nothing; the risk(s) and benefit(s) of the alternative treatment(s) or procedure(s); and the risk(s) and benefit(s) of doing nothing. The patient was provided information about the general risks and possible complications associated with the procedure. These may include, but are not limited to: failure to achieve desired goals, infection, bleeding, organ or nerve damage, allergic reactions, paralysis, and death. In addition, the patient was informed of those risks and complications associated to Spine-related procedures, such as failure to decrease pain; infection (i.e.: Meningitis, epidural or intraspinal abscess); bleeding (i.e.:  epidural hematoma, subarachnoid hemorrhage, or any other type of intraspinal or peri-dural bleeding); organ or nerve damage (i.e.: Any type of peripheral nerve, nerve  root, or spinal cord injury) with subsequent damage to sensory, motor, and/or autonomic systems, resulting in permanent pain, numbness, and/or weakness of one or several areas of the body; allergic reactions; (i.e.: anaphylactic reaction); and/or death. Furthermore, the patient was informed of those risks and complications associated with the medications. These include, but are not limited to: allergic reactions (i.e.: anaphylactic or anaphylactoid reaction(s)); adrenal axis suppression; blood sugar elevation that in diabetics may result in ketoacidosis or comma; water retention that in patients with history of congestive heart failure may result in shortness of breath, pulmonary edema, and decompensation with resultant heart failure; weight gain; swelling or edema; medication-induced neural toxicity; particulate matter embolism and blood vessel occlusion with resultant organ, and/or nervous system infarction; and/or aseptic necrosis of one or more joints. Finally, the patient was informed that Medicine is not an exact science; therefore, there is also the possibility of unforeseen or unpredictable risks and/or possible complications that may result in a catastrophic outcome. The patient indicated having understood very clearly. We have given the patient no guarantees and we have made no promises. Enough time was given to the patient to ask questions, all of which were answered to the patient's satisfaction. Ms. Castagnola has indicated that she wanted to continue with the procedure. Attestation: I, the ordering provider, attest that I have discussed with the patient the benefits, risks, side-effects, alternatives, likelihood of achieving goals, and potential problems during recovery for the procedure that I have provided informed consent. Date:  09/29/2016; Time: 12:29 PM  Pre-Procedure Preparation:  Monitoring: As per clinic protocol. Respiration, ETCO2, SpO2, BP, heart rate and rhythm monitor placed and checked for adequate function Safety Precautions: Patient was assessed for positional comfort and pressure points before starting the procedure. Time-out: I initiated and conducted the "Time-out" before starting the procedure, as per protocol. The patient was asked to participate by confirming the accuracy of the "Time Out" information. Verification of the correct person, site, and procedure were performed and confirmed by me, the nursing staff, and the patient. "Time-out" conducted as per Joint Commission's Universal Protocol (UP.01.01.01). "Time-out" Date & Time: 09/29/2016; 1234 hrs.  Description of Procedure Process:   Position: Prone Target Area: For Lumbar Facet blocks, the target is the groove formed by the junction of the transverse process and superior articular process. For the L5 dorsal ramus, the target is the notch between superior articular process and sacral ala. For the S1 dorsal ramus, the target is the superior and lateral edge of the posterior S1 Sacral foramen. Approach: Paraspinal approach. Area Prepped: Entire Posterior Lumbosacral Region Prepping solution: Hibiclens (4.0% Chlorhexidine gluconate solution) Safety Precautions: Aspiration looking for blood return was conducted prior to all injections. At no point did we inject any substances, as a needle was being advanced. No attempts were made at seeking any paresthesias. Safe injection practices and needle disposal techniques used. Medications properly checked for expiration dates. SDV (single dose vial) medications used. Description of the Procedure: Protocol guidelines were followed. The patient was placed in position over the fluoroscopy table. The target area was identified and the area prepped in the usual manner. Skin desensitized using vapocoolant spray. Skin &  deeper tissues infiltrated with local anesthetic. Appropriate amount of time allowed to pass for local anesthetics to take effect. Radiofrequency needles were introduced to the area of the medial branch at the junction of the superior articular process and transverse process using fluoroscopy. Using the [employer], sensory stimulation using 50 Hz was used to locate &  identify the nerve, making sure that the needle was positioned such that there was no sensory stimulation below 0.3 V or above 0.7 V. Stimulation using 2 Hz was used to evaluate the motor component. Care was taken not to lesion any nerves that demonstrated motor stimulation of the lower extremities at an output of less than 2.5 times that of the sensory threshold, or a maximum of 2.0 V. Once satisfactory placement of the needles was achieved, the above solution was slowly injected after negative aspiration. After waiting for at least 2 minutes, the ablation was performed at 80 degrees C for 60 seconds.The needles were then removed and the area cleansed, making sure to leave some of the prepping solution back to take advantage of its long term bactericidal properties. Vitals:   09/29/16 1306 09/29/16 1311 09/29/16 1321 09/29/16 1331  BP: (!) 127/97 (!) 147/80 (!) 148/79 (!) 153/87  Pulse:      Resp: (!) 22 16 16 16   Temp:  97.8 F (36.6 C)    TempSrc:  Temporal    SpO2: 92% 95% 96% 97%  Weight:      Height:        Start Time: 1234 hrs. End Time: 1305 hrs. Materials & Medications:  Needle(s) Type: Teflon-coated, curved tip, Radiofrequency needle(s) Gauge: 22G Length: 10cm Medication(s): We administered fentaNYL, lactated ringers, midazolam, triamcinolone acetonide, and ropivacaine (PF) 5 mg/mL (0.5%). Please see chart orders for dosing details.  Imaging Guidance (Spinal):  Type of Imaging Technique: Fluoroscopy Guidance (Spinal) Indication(s): Assistance in needle guidance and placement for procedures requiring  needle placement in or near specific anatomical locations not easily accessible without such assistance. Exposure Time: Please see nurses notes. Contrast: None used. Fluoroscopic Guidance: I was personally present during the use of fluoroscopy. "Tunnel Vision Technique" used to obtain the best possible view of the target area. Parallax error corrected before commencing the procedure. "Direction-depth-direction" technique used to introduce the needle under continuous pulsed fluoroscopy. Once target was reached, antero-posterior, oblique, and lateral fluoroscopic projection used confirm needle placement in all planes. Images permanently stored in EMR. Interpretation: No contrast injected. I personally interpreted the imaging intraoperatively. Adequate needle placement confirmed in multiple planes. Permanent images saved into the patient's record.  Antibiotic Prophylaxis:  Indication(s): None identified Antibiotic given: None  Post-operative Assessment:  EBL: None Complications: No immediate post-treatment complications observed by team, or reported by patient. Note: The patient tolerated the entire procedure well. A repeat set of vitals were taken after the procedure and the patient was kept under observation following institutional policy, for this type of procedure. Post-procedural neurological assessment was performed, showing return to baseline, prior to discharge. The patient was provided with post-procedure discharge instructions, including a section on how to identify potential problems. Should any problems arise concerning this procedure, the patient was given instructions to immediately contact us, at any time, without hesitation. In any case, we plan to contact the patient by telephone for a follow-up status report regarding this interventional procedure. Comments:  No additional relevant information.  Plan of Care  Disposition: Discharge home  Discharge Date & Time: 09/29/2016; 1333  hrs.  Physician-requested Follow-up:  Return in about 2 weeks (around 10/13/2016) for opposite side RFA (2-wks from now).  Future Appointments Date Time Provider Department Center  11/10/2016 1:00 PM Delano Metz, MD [facility] None  12/07/2016 1:00 PM Delma Freeze, FNP [facility] None  12/23/2016 4:20 PM Antonieta Iba, MD [facility]  01/24/2017 10:45 AM Delano Metz, MD [facility] None  Medications ordered for procedure: Meds ordered this encounter  Medications  . fentaNYL (SUBLIMAZE) injection 25-50 mcg    Make sure Narcan is available in the pyxis when using this medication. In the event of respiratory depression (RR< 8/min): Titrate NARCAN (naloxone) in increments of 0.1 to 0.2 mg IV at 2-3 minute intervals, until desired degree of reversal.  . lactated ringers infusion 1,000 mL  . midazolam (VERSED) 5 MG/5ML injection 1-2 mg    Make sure Flumazenil is available in the pyxis when using this medication. If oversedation occurs, administer 0.2 mg IV over 15 sec. If after 45 sec no response, administer 0.2 mg again over 1 min; may repeat at 1 min intervals; not to exceed 4 doses (1 mg)  . triamcinolone acetonide (KENALOG-40) injection 40 mg  . lidocaine (PF) (XYLOCAINE) 1 % injection 10 mL  . ropivacaine (PF) 5 mg/mL (0.5%) (NAROPIN) injection 5 mL    Preservative-free (MPF), single use vial.  . oxyCODONE-acetaminophen (PERCOCET) 5-325 MG tablet    Sig: Take 1 tablet by mouth every 6 (six) hours as needed for severe pain.    Dispense:  45 tablet    Refill:  0    Do not place this medication, or any other prescription from our practice, on "Automatic Refill". Patient may have prescription filled one day early if pharmacy is closed on scheduled refill date. Do not fill until: 09/29/16 To last until: 10/09/16   Medications administered: We administered fentaNYL, lactated ringers, midazolam, triamcinolone acetonide, and ropivacaine (PF) 5 mg/mL (0.5%).  See the medical  record for exact dosing, route, and time of administration.  Lab-work, Procedure(s), & Referral(s) Ordered: Orders Placed This Encounter  Procedures  . Radiofrequency,Lumbar  . DG C-Arm 1-60 Min-No Report  . Discharge instructions  . Follow-up  . Informed Consent Details: Transcribe to consent form and obtain patient signature  . Provider attestation of informed consent for procedure/surgical case  . Verify informed consent   Imaging Ordered: Results for orders placed in visit on 08/09/16  DG C-Arm 1-60 Min-No Report   Narrative There is no Radiologist interpretation  for this exam.   New Prescriptions   OXYCODONE-ACETAMINOPHEN (PERCOCET) 5-325 MG TABLET    Take 1 tablet by mouth every 6 (six) hours as needed for severe pain.   Primary Care Physician: Hillery Aldo, MD Location: [facility] Note by: Para March A. Laban Emperor, M.D, DABA, DABAPM, DABPM, DABIPP, FIPP Date: 09/29/2016; Time: 2:01 PM  Disclaimer:  Medicine is not an Visual merchandiser. The only guarantee in medicine is that nothing is guaranteed. It is important to note that the decision to proceed with this intervention was based on the information collected from the patient. The Data and conclusions were drawn from the patient's questionnaire, the interview, and the physical examination. Because the information was provided in large part by the patient, it cannot be guaranteed that it has not been purposely or unconsciously manipulated. Every effort has been made to obtain as much relevant data as possible for this evaluation. It is important to note that the conclusions that lead to this procedure are derived in large part from the available data. Always take into account that the treatment will also be dependent on availability of resources and existing treatment guidelines, considered by other Pain Management Practitioners as being common knowledge and practice, at the time of the intervention. For  Medico-Legal purposes, it is also important to point out that variation in procedural techniques and pharmacological choices are the acceptable  norm. The indications, contraindications, technique, and results of the above procedure should only be interpreted and judged by a Board-Certified Interventional Pain Specialist with extensive familiarity and expertise in the same exact procedure and technique. Attempts at providing opinions without similar or greater experience and expertise than that of the treating physician will be considered as inappropriate and unethical, and shall result in a formal complaint to the state medical board and applicable specialty societies.  Instructions provided at this appointment: Patient Instructions   Post-Procedure instructions Instructions:  Apply ice: Fill a plastic sandwich bag with crushed ice. Cover it with a small towel and apply to injection site. Apply for 15 minutes then remove x 15 minutes. Repeat sequence on day of procedure, until you go to bed. The purpose is to minimize swelling and discomfort after procedure.  Apply heat: Apply heat to procedure site starting the day following the procedure. The purpose is to treat any soreness and discomfort from the procedure.  Food intake: Start with clear liquids (like water) and advance to regular food, as tolerated.   Physical activities: Keep activities to a minimum for the first 8 hours after the procedure.   Driving: If you have received any sedation, you are not allowed to drive for 24 hours after your procedure.  Blood thinner: Restart your blood thinner 6 hours after your procedure. (Only for those taking blood thinners)  Insulin: As soon as you can eat, you may resume your normal dosing schedule. (Only for those taking insulin)  Infection prevention: Keep procedure site clean and dry.  Post-procedure Pain Diary: Extremely important that this be done correctly and accurately. Recorded information  will be used to determine the next step in treatment.  Pain evaluated is that of treated area only. Do not include pain from an untreated area.  Complete every hour, on the hour, for the initial 8 hours. Set an alarm to help you do this part accurately.  Do not go to sleep and have it completed later. It will not be accurate.  Follow-up appointment: Keep your follow-up appointment after the procedure. Usually 2 weeks for most procedures. (6 weeks in the case of radiofrequency.) Bring you pain diary.  Expect:  From numbing medicine (AKA: Local Anesthetics): Numbness or decrease in pain.  Onset: Full effect within 15 minutes of injected.  Duration: It will depend on the type of local anesthetic used. On the average, 1 to 8 hours.   From steroids: Decrease in swelling or inflammation. Once inflammation is improved, relief of the pain will follow.  Onset of benefits: Depends on the amount of swelling present. The more swelling, the longer it will take for the benefits to be seen.   Duration: Steroids will stay in the system x 2 weeks. Duration of benefits will depend on multiple posibilities including persistent irritating factors.  From procedure: Some discomfort is to be expected once the numbing medicine wears off. This should be minimal if ice and heat are applied as instructed. Call if:  You experience numbness and weakness that gets worse with time, as opposed to wearing off.  New onset bowel or bladder incontinence. (Spinal procedures only)  Emergency Numbers:  Durning business hours (Monday - Thursday, 8:00 AM - 4:00 PM) (Friday, 9:00 AM - 12:00 Noon): [phone]  After hours: [phone]   __________________________________________________________________________________________   Pain Score  Introduction: The pain score used by this practice is the Verbal Numerical Rating Scale (VNRS-11). This is an 11-point scale. It is for adults  and children 10 years or  older. There are significant differences in how the pain score is reported, used, and applied. Forget everything you learned in the past and learn this scoring system.  General Information: The scale should reflect your current level of pain. Unless you are specifically asked for the level of your worst pain, or your average pain. If you are asked for one of these two, then it should be understood that it is over the past 24 hours.  Basic Activities of Daily Living (ADL): Personal hygiene, dressing, eating, transferring, and using restroom.  Instructions: Most patients tend to report their level of pain as a combination of two factors, their physical pain and their psychosocial pain. This last one is also known as "suffering" and it is reflection of how physical pain affects you socially and psychologically. From now on, report them separately. From this point on, when asked to report your pain level, report only your physical pain. Use the following table for reference.  Pain Clinic Pain Levels (0-5/10)  Pain Level Score Description  No Pain 0   Mild pain 1 Nagging, annoying, but does not interfere with basic activities of daily living (ADL). Patients are able to eat, bathe, get dressed, toileting (being able to get on and off the toilet and perform personal hygiene functions), transfer (move in and out of bed or a chair without assistance), and maintain continence (able to control bladder and bowel functions). Blood pressure and heart rate are unaffected. A normal heart rate for a healthy adult ranges from 60 to 100 bpm (beats per minute).   Mild to moderate pain 2 Noticeable and distracting. Impossible to hide from other people. More frequent flare-ups. Still possible to adapt and function close to normal. It can be very annoying and may have occasional stronger flare-ups. With discipline, patients may get used to it and adapt.   Moderate pain 3 Interferes significantly with activities of daily  living (ADL). It becomes difficult to feed, bathe, get dressed, get on and off the toilet or to perform personal hygiene functions. Difficult to get in and out of bed or a chair without assistance. Very distracting. With effort, it can be ignored when deeply involved in activities.   Moderately severe pain 4 Impossible to ignore for more than a few minutes. With effort, patients may still be able to manage work or participate in some social activities. Very difficult to concentrate. Signs of autonomic nervous system discharge are evident: dilated pupils (mydriasis); mild sweating (diaphoresis); sleep interference. Heart rate becomes elevated (>115 bpm). Diastolic blood pressure (lower number) rises above 100 mmHg. Patients find relief in laying down and not moving.   Severe pain 5 Intense and extremely unpleasant. Associated with frowning face and frequent crying. Pain overwhelms the senses.  Ability to do any activity or maintain social relationships becomes significantly limited. Conversation becomes difficult. Pacing back and forth is common, as getting into a comfortable position is nearly impossible. Pain wakes you up from deep sleep. Physical signs will be obvious: pupillary dilation; increased sweating; goosebumps; brisk reflexes; cold, clammy hands and feet; nausea, vomiting or dry heaves; loss of appetite; significant sleep disturbance with inability to fall asleep or to remain asleep. When persistent, significant weight loss is observed due to the complete loss of appetite and sleep deprivation.  Blood pressure and heart rate becomes significantly elevated. Caution: If elevated blood pressure triggers a pounding headache associated with blurred vision, then the patient should immediately seek attention at an  urgent or emergency care unit, as these may be signs of an impending stroke.    Emergency Department Pain Levels (6-10/10)  Emergency Room Pain 6 Severely limiting. Requires emergency care  and should not be seen or managed at an outpatient pain management facility. Communication becomes difficult and requires great effort. Assistance to reach the emergency department may be required. Facial flushing and profuse sweating along with potentially dangerous increases in heart rate and blood pressure will be evident.   Distressing pain 7 Self-care is very difficult. Assistance is required to transport, or use restroom. Assistance to reach the emergency department will be required. Tasks requiring coordination, such as bathing and getting dressed become very difficult.   Disabling pain 8 Self-care is no longer possible. At this level, pain is disabling. The individual is unable to do even the most "basic" activities such as walking, eating, bathing, dressing, transferring to a bed, or toileting. Fine motor skills are lost. It is difficult to think clearly.   Incapacitating pain 9 Pain becomes incapacitating. Thought processing is no longer possible. Difficult to remember your own name. Control of movement and coordination are lost.   The worst pain imaginable 10 At this level, most patients pass out from pain. When this level is reached, collapse of the autonomic nervous system occurs, leading to a sudden drop in blood pressure and heart rate. This in turn results in a temporary and dramatic drop in blood flow to the brain, leading to a loss of consciousness. Fainting is one of the body's self defense mechanisms. Passing out puts the brain in a calmed state and causes it to shut down for a while, in order to begin the healing process.    Summary: 1. Refer to this scale when providing [place] with your pain level. 2. Be accurate and careful when reporting your pain level. This will help with your care. 3. Over-reporting your pain level will lead to loss of credibility. 4. Even a level of 1/10 means that there is pain and will be treated at our facility. 5. High, inaccurate reporting will be  documented as "Symptom Exaggeration", leading to loss of credibility and suspicions of possible secondary gains such as obtaining more narcotics, or wanting to appear disabled, for fraudulent reasons. 6. Only pain levels of 5 or below will be seen at our facility. 7. Pain levels of 6 and above will be sent to the Emergency Department and the appointment cancelled. _____________________________________________________________________________________________  Pain Management Discharge Instructions  General Discharge Instructions :  If you need to reach your doctor call: Monday-Friday 8:00 am - 4:00 pm at [phone] or toll free [phone].  After clinic hours [phone] to have operator reach doctor.  Bring all of your medication bottles to all your appointments in the pain clinic.  To cancel or reschedule your appointment with Pain Management please remember to call 24 hours in advance to avoid a fee.  Refer to the educational materials which you have been given on: General Risks, I had my Procedure. Discharge Instructions, Post Sedation.  Post Procedure Instructions:  The drugs you were given will stay in your system until tomorrow, so for the next 24 hours you should not drive, make any legal decisions or drink any alcoholic beverages.  You may eat anything you prefer, but it is better to start with liquids then soups and crackers, and gradually work up to solid foods.  Please notify your doctor immediately if you have any unusual bleeding, trouble breathing or pain that is  not related to your normal pain.  Depending on the type of procedure that was done, some parts of your body may feel week and/or numb.  This usually clears up by tonight or the next day.  Walk with the use of an assistive device or accompanied by an adult for the 24 hours.  You may use ice on the affected area for the first 24 hours.  Put ice in a Ziploc bag and cover with a towel and place against area 15  minutes on 15 minutes off.  You may switch to heat after 24 hours.

## 2016-09-30 ENCOUNTER — Telehealth: Payer: Self-pay | Admitting: *Deleted

## 2016-09-30 NOTE — Telephone Encounter (Signed)
Denies any problems post procedure

## 2016-10-01 ENCOUNTER — Other Ambulatory Visit: Payer: Self-pay | Admitting: Pain Medicine

## 2016-10-01 DIAGNOSIS — R252 Cramp and spasm: Secondary | ICD-10-CM

## 2016-10-01 DIAGNOSIS — M1732 Unilateral post-traumatic osteoarthritis, left knee: Secondary | ICD-10-CM

## 2016-10-01 DIAGNOSIS — M62830 Muscle spasm of back: Secondary | ICD-10-CM

## 2016-10-12 ENCOUNTER — Other Ambulatory Visit: Payer: Self-pay | Admitting: Pain Medicine

## 2016-10-12 DIAGNOSIS — M792 Neuralgia and neuritis, unspecified: Secondary | ICD-10-CM

## 2016-10-12 DIAGNOSIS — G609 Hereditary and idiopathic neuropathy, unspecified: Secondary | ICD-10-CM

## 2016-10-20 ENCOUNTER — Other Ambulatory Visit: Payer: Self-pay | Admitting: Pain Medicine

## 2016-10-20 DIAGNOSIS — M1732 Unilateral post-traumatic osteoarthritis, left knee: Secondary | ICD-10-CM

## 2016-10-20 DIAGNOSIS — M62830 Muscle spasm of back: Secondary | ICD-10-CM

## 2016-10-20 DIAGNOSIS — R252 Cramp and spasm: Secondary | ICD-10-CM

## 2016-11-10 ENCOUNTER — Ambulatory Visit: Payer: Medicaid Other | Attending: Pain Medicine | Admitting: Pain Medicine

## 2016-11-10 ENCOUNTER — Encounter: Payer: Self-pay | Admitting: Pain Medicine

## 2016-11-10 VITALS — BP 151/74 | HR 78 | Temp 98.7°F | Resp 16 | Ht 68.0 in | Wt 208.0 lb

## 2016-11-10 DIAGNOSIS — M1732 Unilateral post-traumatic osteoarthritis, left knee: Secondary | ICD-10-CM | POA: Diagnosis not present

## 2016-11-10 DIAGNOSIS — R7982 Elevated C-reactive protein (CRP): Secondary | ICD-10-CM | POA: Insufficient documentation

## 2016-11-10 DIAGNOSIS — G8929 Other chronic pain: Secondary | ICD-10-CM | POA: Diagnosis present

## 2016-11-10 DIAGNOSIS — M25561 Pain in right knee: Secondary | ICD-10-CM

## 2016-11-10 DIAGNOSIS — I5032 Chronic diastolic (congestive) heart failure: Secondary | ICD-10-CM | POA: Insufficient documentation

## 2016-11-10 DIAGNOSIS — G4733 Obstructive sleep apnea (adult) (pediatric): Secondary | ICD-10-CM | POA: Insufficient documentation

## 2016-11-10 DIAGNOSIS — M25062 Hemarthrosis, left knee: Secondary | ICD-10-CM | POA: Insufficient documentation

## 2016-11-10 DIAGNOSIS — F329 Major depressive disorder, single episode, unspecified: Secondary | ICD-10-CM | POA: Insufficient documentation

## 2016-11-10 DIAGNOSIS — E871 Hypo-osmolality and hyponatremia: Secondary | ICD-10-CM | POA: Insufficient documentation

## 2016-11-10 DIAGNOSIS — I272 Pulmonary hypertension, unspecified: Secondary | ICD-10-CM | POA: Insufficient documentation

## 2016-11-10 DIAGNOSIS — N289 Disorder of kidney and ureter, unspecified: Secondary | ICD-10-CM | POA: Insufficient documentation

## 2016-11-10 DIAGNOSIS — E559 Vitamin D deficiency, unspecified: Secondary | ICD-10-CM | POA: Insufficient documentation

## 2016-11-10 DIAGNOSIS — Z79891 Long term (current) use of opiate analgesic: Secondary | ICD-10-CM | POA: Diagnosis not present

## 2016-11-10 DIAGNOSIS — M25559 Pain in unspecified hip: Secondary | ICD-10-CM | POA: Diagnosis not present

## 2016-11-10 DIAGNOSIS — M47896 Other spondylosis, lumbar region: Secondary | ICD-10-CM | POA: Insufficient documentation

## 2016-11-10 DIAGNOSIS — J449 Chronic obstructive pulmonary disease, unspecified: Secondary | ICD-10-CM | POA: Insufficient documentation

## 2016-11-10 DIAGNOSIS — M79642 Pain in left hand: Secondary | ICD-10-CM | POA: Insufficient documentation

## 2016-11-10 DIAGNOSIS — F1190 Opioid use, unspecified, uncomplicated: Secondary | ICD-10-CM | POA: Diagnosis not present

## 2016-11-10 DIAGNOSIS — M542 Cervicalgia: Secondary | ICD-10-CM | POA: Insufficient documentation

## 2016-11-10 DIAGNOSIS — M545 Low back pain: Secondary | ICD-10-CM | POA: Insufficient documentation

## 2016-11-10 DIAGNOSIS — M1730 Unilateral post-traumatic osteoarthritis, unspecified knee: Secondary | ICD-10-CM | POA: Diagnosis not present

## 2016-11-10 DIAGNOSIS — Z87891 Personal history of nicotine dependence: Secondary | ICD-10-CM | POA: Diagnosis not present

## 2016-11-10 DIAGNOSIS — M25562 Pain in left knee: Secondary | ICD-10-CM | POA: Diagnosis present

## 2016-11-10 DIAGNOSIS — R252 Cramp and spasm: Secondary | ICD-10-CM

## 2016-11-10 DIAGNOSIS — M79641 Pain in right hand: Secondary | ICD-10-CM | POA: Insufficient documentation

## 2016-11-10 DIAGNOSIS — E785 Hyperlipidemia, unspecified: Secondary | ICD-10-CM | POA: Insufficient documentation

## 2016-11-10 DIAGNOSIS — M62830 Muscle spasm of back: Secondary | ICD-10-CM

## 2016-11-10 DIAGNOSIS — M4696 Unspecified inflammatory spondylopathy, lumbar region: Secondary | ICD-10-CM

## 2016-11-10 DIAGNOSIS — M170 Bilateral primary osteoarthritis of knee: Secondary | ICD-10-CM | POA: Insufficient documentation

## 2016-11-10 DIAGNOSIS — G894 Chronic pain syndrome: Secondary | ICD-10-CM | POA: Diagnosis not present

## 2016-11-10 DIAGNOSIS — I10 Essential (primary) hypertension: Secondary | ICD-10-CM | POA: Insufficient documentation

## 2016-11-10 DIAGNOSIS — M47816 Spondylosis without myelopathy or radiculopathy, lumbar region: Secondary | ICD-10-CM

## 2016-11-10 DIAGNOSIS — G609 Hereditary and idiopathic neuropathy, unspecified: Secondary | ICD-10-CM

## 2016-11-10 DIAGNOSIS — M792 Neuralgia and neuritis, unspecified: Secondary | ICD-10-CM

## 2016-11-10 DIAGNOSIS — Z7982 Long term (current) use of aspirin: Secondary | ICD-10-CM | POA: Insufficient documentation

## 2016-11-10 DIAGNOSIS — G629 Polyneuropathy, unspecified: Secondary | ICD-10-CM | POA: Insufficient documentation

## 2016-11-10 MED ORDER — HYDROCODONE-ACETAMINOPHEN 5-325 MG PO TABS: 1.0000 | ORAL_TABLET | Freq: Two times a day (BID) | ORAL | 0 refills | Status: DC | PRN

## 2016-11-10 MED ORDER — HYDROCODONE-ACETAMINOPHEN 5-325 MG PO TABS: 1.0000 | ORAL_TABLET | Freq: Four times a day (QID) | ORAL | 0 refills | Status: DC | PRN

## 2016-11-10 MED ORDER — TIZANIDINE HCL 4 MG PO CAPS: 4.0000 mg | ORAL_CAPSULE | Freq: Three times a day (TID) | ORAL | 2 refills | Status: DC | PRN

## 2016-11-10 MED ORDER — PREGABALIN 75 MG PO CAPS: 75.0000 mg | ORAL_CAPSULE | Freq: Three times a day (TID) | ORAL | 0 refills | Status: DC

## 2016-11-10 NOTE — Progress Notes (Deleted)
   Patient states she is out of Lyrica 75 mg bid and also Zanaflex 4 mg tid prn.   Patient states she is also out of oxycodone 5 - 325 mg 1 tab q12 hours.

## 2016-11-10 NOTE — Patient Instructions (Addendum)
____________________________________________________________________________________________  Preparing for Procedure with Sedation Instructions: . Oral Intake: Do not eat or drink anything for at least 8 hours prior to your procedure. . Transportation: Public transportation is not allowed. Bring an adult driver. The driver must be physically present in our waiting room before any procedure can be started. . Physical Assistance: Bring an adult physically capable of assisting you, in the event you need help. This adult should keep you company at home for at least 6 hours after the procedure. . Blood Pressure Medicine: Take your blood pressure medicine with a sip of water the morning of the procedure. . Blood thinners:  . Diabetics on insulin: Notify the staff so that you can be scheduled 1st case in the morning. If your diabetes requires high dose insulin, take only  of your normal insulin dose the morning of the procedure and notify the staff that you have done so. . Preventing infections: Shower with an antibacterial soap the morning of your procedure. . Build-up your immune system: Take 1000 mg of Vitamin C with every meal (3 times a day) the day prior to your procedure. . Antibiotics: Inform the staff if you have a condition or reason that requires you to take antibiotics before dental procedures. . Pregnancy: If you are pregnant, call and cancel the procedure. . Sickness: If you have a cold, fever, or any active infections, call and cancel the procedure. . Arrival: You must be in the facility at least 30 minutes prior to your scheduled procedure. . Children: Do not bring children with you. . Dress appropriately: Bring dark clothing that you would not mind if they get stained. . Valuables: Do not bring any jewelry or valuables. Procedure appointments are reserved for interventional treatments only. . No Prescription Refills. . No medication changes will be discussed during procedure  appointments. . No disability issues will be discussed. ______________________________________________________________________________________________  Radiofrequency Lesioning Radiofrequency lesioning is a procedure that is performed to relieve pain. The procedure is often used for back, neck, or arm pain. Radiofrequency lesioning involves the use of a machine that creates radio waves to make heat. During the procedure, the heat is applied to the nerve that carries the pain signal. The heat damages the nerve and interferes with the pain signal. Pain relief usually starts about 2 weeks after the procedure and lasts for 6 months to 1 year. Tell a health care provider about: Any allergies you have. All medicines you are taking, including vitamins, herbs, eye drops, creams, and over-the-counter medicines. Any problems you or family members have had with anesthetic medicines. Any blood disorders you have. Any surgeries you have had. Any medical conditions you have. Whether you are pregnant or may be pregnant. What are the risks? Generally, this is a safe procedure. However, problems may occur, including: Pain or soreness at the injection site. Infection at the injection site. Damage to nerves or blood vessels. What happens before the procedure? Ask your health care provider about: Changing or stopping your regular medicines. This is especially important if you are taking diabetes medicines or blood thinners. Taking medicines such as aspirin and ibuprofen. These medicines can thin your blood. Do not take these medicines before your procedure if your health care provider instructs you not to. Follow instructions from your health care provider about eating or drinking restrictions. Plan to have someone take you home after the procedure. If you go home right after the procedure, plan to have someone with you for 24 hours. What happens during the   procedure? You will be given one or more of the  following: A medicine to help you relax (sedative). A medicine to numb the area (local anesthetic). You will be awake during the procedure. You will need to be able to talk with the health care provider during the procedure. With the help of a type of X-ray (fluoroscopy), the health care provider will insert a radiofrequency needle into the area to be treated. Next, a wire that carries the radio waves (electrode) will be put through the radiofrequency needle. An electrical pulse will be sent through the electrode to verify the correct nerve. You will feel a tingling sensation, and you may have muscle twitching. Then, the tissue that is around the needle tip will be heated by an electric current that is passed using the radiofrequency machine. This will numb the nerves. A bandage (dressing) will be put on the insertion area after the procedure is done. The procedure may vary among health care providers and hospitals. What happens after the procedure? Your blood pressure, heart rate, breathing rate, and blood oxygen level will be monitored often until the medicines you were given have worn off. Return to your normal activities as directed by your health care provider. This information is not intended to replace advice given to you by your health care provider. Make sure you discuss any questions you have with your health care provider. Document Released: 03/03/2011 Document Revised: 12/11/2015 Document Reviewed: 08/12/2014 [employer] Interactive Patient Education  2017 [employer]  

## 2016-11-10 NOTE — Progress Notes (Signed)
Nursing Pain Medication Assessment:  Safety precautions to be maintained throughout the outpatient stay will include: orient to surroundings, keep bed in low position, maintain call bell within reach at all times, provide assistance with transfer out of bed and ambulation.  Medication Inspection Compliance: Erica [name] did not comply with our request to bring her pills to be counted. She was reminded that bringing the medication bottles, even when empty, is a requirement.  Medication: None brought in. Pill/Patch Count: None available to be counted. Bottle Appearance: No container available. Did not bring bottle(s) to appointment. Filled Date: N/A Last Medication intake:  Today

## 2016-11-10 NOTE — Progress Notes (Signed)
Patient's Name: Erica [name]  MRN: [id number]  Referring Provider: Denton Lank, MD  DOB: 1965/09/24  PCP: Denton Lank, MD  DOS: 11/10/2016  Note by: Kathlen Brunswick. Dossie Arbour, MD  Service setting: Ambulatory outpatient  Specialty: Interventional Pain Management  Location: [facility] (AMB) Pain Management Facility    Patient type: Established   Primary Reason(s) for Visit: Encounter for prescription drug management & post-procedure evaluation of chronic illness with mild to moderate exacerbation(Level of risk: moderate) CC: Back Pain (left lower back) and Hip Pain (left)  HPI  [name] is a 51 y.o. year old, female patient, who comes today for a post-procedure evaluation and medication management. She has Chest pain; Hyponatremia; Shortness of breath; Swelling; Cough; COPD (chronic obstructive pulmonary disease) with acute bronchitis ([place]); Renal insufficiency; Pulmonary HTN ([place]); COPD, mild ([place]); Chronic diastolic heart failure ([place]); Smoker; Obstructive sleep apnea; Tachycardia; Depression; Long term current use of opiate analgesic; Long term prescription opiate use; Opiate use; Chronic low back pain (Location of Primary Source of Pain) (Bilateral) (L>R); Chronic hip pain (Location of Secondary source of pain) (Bilateral) (L>R); Chronic knee pain (Location of Tertiary source of pain) (Bilateral) (L>R); Chronic neck pain (Bilateral) (R>L); Chronic upper back pain (midline); Chronic foot pain (bottom of feet) (Bilateral) (R>L); Chronic hand pain (Bilateral); Peripheral neuropathy, idiopathic (upper and lower extremity); Chronic sacroiliac joint pain (Bilateral) (R>L); Lumbar facet syndrome (Bilateral) (R>L); Chronic pain syndrome; Elevated sedimentation rate; Elevated C-reactive protein (CRP); Neurogenic pain; Vitamin D deficiency; Lumbar spondylosis; Osteoarthritis of knee (Bilateral) (L>R); Knee hemarthrosis (Left); Intermittent left thoracic Muscle cramps; Spasm of thoracolumbar muscle (Left); Post-traumatic  osteoarthritis of knee (Left); Acute postoperative pain; and Hemarthrosis involving knee joint, left on her problem list. Her primarily concern today is the Back Pain (left lower back) and Hip Pain (left)  Pain Assessment: Self-Reported Pain Score: 4 /10 Clinically the patient looks like a 2/10 Reported level is inconsistent with clinical observations. Information on the proper use of the pain scale provided to the patient today Pain Type: Chronic pain Pain Location: Back (left hip) Pain Orientation: Lower, Left Pain Descriptors / Indicators: Stabbing, Constant, Throbbing, Nagging, Spasm, Restless Pain Frequency: Constant  [name] was last seen on 10/20/2016 for a procedure. During today's appointment we reviewed [name]'s post-procedure results, as well as her outpatient medication regimen.  Further details on both, my assessment(s), as well as the proposed treatment plan, please see below.  Controlled Substance Pharmacotherapy Assessment REMS (Risk Evaluation and Mitigation Strategy)  Analgesic:Hydrocodone/APAP 5/325 one tablet every 6 hours (20 mg/day) MME/day:47m/day Patterson,Delores G, RN  11/10/2016  1:35 PM  Sign at close encounter Nursing Pain Medication Assessment:  Safety precautions to be maintained throughout the outpatient stay will include: orient to surroundings, keep bed in low position, maintain call bell within reach at all times, provide assistance with transfer out of bed and ambulation.  Medication Inspection Compliance: Ms. BTilsondid not comply with our request to bring her pills to be counted. She was reminded that bringing the medication bottles, even when empty, is a requirement.  Medication: None brought in. Pill/Patch Count: None available to be counted. Bottle Appearance: No container available. Did not bring bottle(s) to appointment. Filled Date: N/A Last Medication intake:  Today        Pharmacokinetics: Liberation and absorption (onset of  action): WNL Distribution (time to peak effect): WNL Metabolism and excretion (duration of action): WNL         Pharmacodynamics: Desired effects: Analgesia: Ms. BKooymanreports >50% benefit.  Functional ability: Patient reports that medication allows her to accomplish basic ADLs Clinically meaningful improvement in function (CMIF): Sustained CMIF goals met Perceived effectiveness: Described as relatively effective, allowing for increase in activities of daily living (ADL) Undesirable effects: Side-effects or Adverse reactions: None reported Monitoring: [place] PMP: Online review of the past 33-monthperiod conducted. Compliant with practice rules and regulations List of all UDS test(s) done:  Lab Results  Component Value Date   TOXASSSELUR FINAL 08/31/2016   SUMMARY FINAL 04/29/2016   Last UDS on record: ToxAssure Select 13  Date Value Ref Range Status  08/31/2016 FINAL  Final    Comment:    ==================================================================== TOXASSURE SELECT 13 (MW) ==================================================================== Specimen Alert Note:  Urinary creatinine is very low; ability to detect some drugs may be compromised; creatinine-normalized drug concentrations should be interpreted with caution. Suggest recollection. ==================================================================== Test                             Result       Flag       Units Drug Present and Declared for Prescription Verification   Hydrocodone                    2520         EXPECTED   ng/mg creat    Sources of hydrocodone include scheduled prescription    medications. ==================================================================== Test                      Result    Flag   Units      Ref Range   Creatinine              5         L      mg/dL      >=20 ==================================================================== Declared Medications:  The flagging and  interpretation on this report are based on the  following declared medications.  Unexpected results may arise from  inaccuracies in the declared medications.  **Note: The testing scope of this panel includes these medications:  Hydrocodone (Hydrocodone-Acetaminophen)  **Note: The testing scope of this panel does not include following  reported medications:  Acetaminophen (Hydrocodone-Acetaminophen)  Albuterol  Aspirin  Atorvastatin  Azithromycin  Cholecalciferol  Diltiazem  Duloxetine  Fluconazole  Fluticasone  Folic acid  Furosemide  Pantoprazole  Potassium  Prednisone  Pregabalin  Salmeterol  Sucralfate  Tiotropium  Vitamin D2 (Ergocalciferol) ==================================================================== For clinical consultation, please call ([phone] ====================================================================    UDS interpretation: Compliant          Medication Assessment Form: Reviewed. Patient indicates being compliant with therapy Treatment compliance: Compliant Risk Assessment Profile: Aberrant behavior: See prior evaluations. None observed or detected today Comorbid factors increasing risk of overdose: See prior notes. No additional risks detected today Risk of substance use disorder (SUD): Low Opioid Risk Tool (ORT) Total Score: 0  Interpretation Table:  Score <3 = Low Risk for SUD  Score between 4-7 = Moderate Risk for SUD  Score >8 = High Risk for Opioid Abuse   Risk Mitigation Strategies:  Patient Counseling: Covered Patient-Prescriber Agreement (PPA): Present and active  Notification to other healthcare providers: Done  Pharmacologic Plan: No change in therapy, at this time  Post-Procedure Assessment  10/20/2016 Procedure: Left sided lumbar facet radiofrequency ablation under fluoroscopic guidance and IV sedation Pre-procedure pain score:  8/10 Post-procedure pain score: 0/10 (100% relief) Influential Factors: BMI: 31.63  kg/m Intra-procedural challenges: None observed Assessment challenges: None detected         Post-procedural side-effects, adverse reactions, or complications: None reported Reported issues: None  Sedation: Sedation provided. When no sedatives are used, the analgesic levels obtained are directly associated to the effectiveness of the local anesthetics. However, when sedation is provided, the level of analgesia obtained during the initial 1 hour following the intervention, is believed to be the result of a combination of factors. These factors may include, but are not limited to: 1. The effectiveness of the local anesthetics used. 2. The effects of the analgesic(s) and/or anxiolytic(s) used. 3. The degree of discomfort experienced by the patient at the time of the procedure. 4. The patients ability and reliability in recalling and recording the events. 5. The presence and influence of possible secondary gains and/or psychosocial factors. Reported result: Relief experienced during the 1st hour after the procedure: 100 % (Ultra-Short Term Relief) Interpretative annotation: Analgesia during this period is likely to be Local Anesthetic and/or IV Sedative (Analgesic/Anxiolitic) related.          Effects of local anesthetic: The analgesic effects attained during this period are directly associated to the localized infiltration of local anesthetics and therefore cary significant diagnostic value as to the etiological location, or anatomical origin, of the pain. Expected duration of relief is directly dependent on the pharmacodynamics of the local anesthetic used. Long-acting (4-6 hours) anesthetics used.  Reported result: Relief during the next 4 to 6 hour after the procedure: 100 % (Short-Term Relief) Interpretative annotation: Complete relief would suggest area to be the source of the pain.          Long-term benefit: Defined as the period of time past the expected duration of local anesthetics. With  the possible exception of prolonged sympathetic blockade from the local anesthetics, benefits during this period are typically attributed to, or associated with, other factors such as analgesic sensory neuropraxia, antiinflammatory effects, or beneficial biochemical changes provided by agents other than the local anesthetics Reported result: Extended relief following procedure: 80 % (lower back is at approx 80% relief since procedure. ) (Long-Term Relief) Interpretative annotation: Good relief. Possible therapeutic success. Benefit could signal adequate RF ablation  Current benefits: Defined as persistent relief that continues at this point in time.   Reported results: Treated area: 80 % Ms. Pagan reports improvement in function Interpretative annotation: Long-term benefit would suggest adequate RF ablation  Interpretation: Results would suggest a successful therapeutic intervention.          Laboratory Chemistry  Inflammation Markers Lab Results  Component Value Date   CRP 1.3 (H) 04/30/2016   ESRSEDRATE 31 (H) 04/30/2016   (CRP: Acute Phase) (ESR: Chronic Phase) Renal Function Markers Lab Results  Component Value Date   BUN 14 06/14/2016   CREATININE 0.95 06/14/2016   GFRAA >60 06/14/2016   GFRNONAA >60 06/14/2016   Hepatic Function Markers Lab Results  Component Value Date   AST 27 06/14/2016   ALT 26 06/14/2016   ALBUMIN 4.2 06/14/2016   ALKPHOS 83 06/14/2016   Electrolytes Lab Results  Component Value Date   NA 136 06/14/2016   K 3.4 (L) 06/14/2016   CL 101 06/14/2016   CALCIUM 9.4 06/14/2016   MG 1.9 04/30/2016   Neuropathy Markers Lab Results  Component Value Date   VITAMINB12 248 04/30/2016   Bone Pathology Markers Lab Results  Component Value Date   ALKPHOS 83 06/14/2016   25OHVITD1 15 (L) 04/30/2016   25OHVITD2 <  1.0 04/30/2016   25OHVITD3 15 04/30/2016   CALCIUM 9.4 06/14/2016   Coagulation Parameters Lab Results  Component Value Date   PLT 249  06/14/2016   Cardiovascular Markers Lab Results  Component Value Date   BNP 42.0 06/14/2016   HGB 13.9 06/14/2016   HCT 40.7 06/14/2016   Note: Lab results reviewed.  Recent Diagnostic Imaging Review  Dg C-arm 1-60 Min-no Report  Result Date: 09/29/2016 Fluoroscopy was utilized by the requesting physician.  No radiographic interpretation.   Note: Imaging results reviewed.          Meds  The patient has a current medication list which includes the following prescription(s): albuterol, aspirin, atorvastatin, vitamin d3, diltiazem, duloxetine, fluticasone-salmeterol, folic acid, furosemide, hydrocodone-acetaminophen, hydrocodone-acetaminophen, hydrocodone-acetaminophen, oxygen-helium, pantoprazole, potassium chloride sa, pregabalin, sucralfate, tiotropium, and tizanidine.  Current Outpatient Prescriptions on File Prior to Visit  Medication Sig  . albuterol (PROAIR HFA) 108 (90 Base) MCG/ACT inhaler Inhale into the lungs 4 (four) times daily.  Marland Kitchen aspirin EC 81 MG EC tablet Take 1 tablet (81 mg total) by mouth daily.  Marland Kitchen atorvastatin (LIPITOR) 40 MG tablet Take 40 mg by mouth daily.  . Cholecalciferol (VITAMIN D3) 2000 units capsule Take 1 capsule (2,000 Units total) by mouth daily.  Marland Kitchen diltiazem (CARDIZEM) 30 MG tablet Take 1 tablet (30 mg total) by mouth 3 (three) times daily as needed.  . DULoxetine (CYMBALTA) 60 MG capsule Take 60 mg by mouth daily.  . Fluticasone-Salmeterol (ADVAIR DISKUS IN) Inhale into the lungs 2 (two) times daily.  . folic acid (FOLVITE) 1 MG tablet Take 1 mg by mouth daily.  . furosemide (LASIX) 40 MG tablet Take 40 mg by mouth 2 (two) times daily.  . OXYGEN Inhale into the lungs. 2 liters at bedtime  . pantoprazole (PROTONIX) 40 MG tablet Take 1 tablet (40 mg total) by mouth daily.  . potassium chloride SA (K-DUR,KLOR-CON) 20 MEQ tablet Take 20 mEq by mouth 2 (two) times daily.  . sucralfate (CARAFATE) 1 G tablet Take 1 tablet (1 g total) by mouth 4 (four) times  daily -  with meals and at bedtime.  Marland Kitchen tiotropium (SPIRIVA HANDIHALER) 18 MCG inhalation capsule Place 18 mcg into inhaler and inhale daily.   No current facility-administered medications on file prior to visit.    ROS  Constitutional: Denies any fever or chills Gastrointestinal: No reported hemesis, hematochezia, vomiting, or acute GI distress Musculoskeletal: Denies any acute onset joint swelling, redness, loss of ROM, or weakness Neurological: No reported episodes of acute onset apraxia, aphasia, dysarthria, agnosia, amnesia, paralysis, loss of coordination, or loss of consciousness  Allergies  Ms. Older is allergic to tramadol and penicillins.  [place]  Drug: Ms. Cimino  reports that she does not use drugs. Alcohol:  reports that she drinks alcohol. Tobacco:  reports that she quit smoking about 16 months ago. Her smoking use included Cigarettes. She has a 36.00 pack-year smoking history. She has never used smokeless tobacco. Medical:  has a past medical history of Arthritis; Asthma; CHF (congestive heart failure) ([place]); COPD (chronic obstructive pulmonary disease) ([place]); Gastric ulcer; Hyperlipidemia; Hypertension; and OSA on CPAP (no ins - no cpap machine for the last 4 years). Family: family history includes Breast cancer in her mother; Diabetes in her maternal grandmother; Lung cancer in her father.  Past Surgical History:  Procedure Laterality Date  . FLEXIBLE BRONCHOSCOPY N/A 06/20/2015   Procedure: FLEXIBLE BRONCHOSCOPY;  Surgeon: Laverle Hobby, MD;  Location: [facility] ORS;  Service: Pulmonary;  Laterality:  N/A;  . KNEE SURGERY Left    8 knee surgeries   Constitutional Exam  General appearance: Well nourished, well developed, and well hydrated. In no apparent acute distress Vitals:   11/10/16 1321  BP: (!) 151/74  Pulse: 78  Resp: 16  Temp: 98.7 F (37.1 C)  TempSrc: Oral  SpO2: 97%  Weight: 208 lb (94.3 kg)  Height: 5' 8"  (1.727 m)   BMI Assessment: Estimated body  mass index is 31.63 kg/m as calculated from the following:   Height as of this encounter: 5' 8"  (1.727 m).   Weight as of this encounter: 208 lb (94.3 kg).  BMI interpretation table: BMI level Category Range association with higher incidence of chronic pain  <18 kg/m2 Underweight   18.5-24.9 kg/m2 Ideal body weight   25-29.9 kg/m2 Overweight Increased incidence by 20%  30-34.9 kg/m2 Obese (Class I) Increased incidence by 68%  35-39.9 kg/m2 Severe obesity (Class II) Increased incidence by 136%  >40 kg/m2 Extreme obesity (Class III) Increased incidence by 254%   BMI Readings from Last 4 Encounters:  11/10/16 31.63 kg/m  09/29/16 31.17 kg/m  09/24/16 31.55 kg/m  09/08/16 28.89 kg/m   Wt Readings from Last 4 Encounters:  11/10/16 208 lb (94.3 kg)  09/29/16 205 lb (93 kg)  09/24/16 207 lb 8 oz (94.1 kg)  09/08/16 190 lb (86.2 kg)  Psych/Mental status: Alert, oriented x 3 (person, place, & time)       Eyes: PERLA Respiratory: No evidence of acute respiratory distress  Cervical Spine Exam  Inspection: No masses, redness, or swelling Alignment: Symmetrical Functional ROM: Unrestricted ROM      Stability: No instability detected Muscle strength & Tone: Functionally intact Sensory: Unimpaired Palpation: No palpable anomalies              Upper Extremity (UE) Exam    Side: Right upper extremity  Side: Left upper extremity  Inspection: No masses, redness, swelling, or asymmetry. No contractures  Inspection: No masses, redness, swelling, or asymmetry. No contractures  Functional ROM: Unrestricted ROM          Functional ROM: Unrestricted ROM          Muscle strength & Tone: Functionally intact  Muscle strength & Tone: Functionally intact  Sensory: Unimpaired  Sensory: Unimpaired  Palpation: No palpable anomalies              Palpation: No palpable anomalies              Specialized Test(s): Deferred         Specialized Test(s): Deferred          Thoracic Spine Exam  Inspection:  No masses, redness, or swelling Alignment: Symmetrical Functional ROM: Unrestricted ROM Stability: No instability detected Sensory: Unimpaired Muscle strength & Tone: No palpable anomalies  Lumbar Spine Exam  Inspection: No masses, redness, or swelling Alignment: Symmetrical Functional ROM: Improved after treatment      Stability: No instability detected Muscle strength & Tone: Functionally intact Sensory: Unimpaired Palpation: No palpable anomalies       Provocative Tests: Lumbar Hyperextension and rotation test: Positive on the right for facet joint pain. Patrick's Maneuver: evaluation deferred today                    Gait & Posture Assessment  Ambulation: Unassisted Gait: Relatively normal for age and body habitus Posture: WNL   Lower Extremity Exam    Side: Right lower extremity  Side: Left lower extremity  Inspection: No masses, redness, swelling, or asymmetry. No contractures  Inspection: No masses, redness, swelling, or asymmetry. No contractures  Functional ROM: Unrestricted ROM          Functional ROM: Unrestricted ROM          Muscle strength & Tone: Functionally intact  Muscle strength & Tone: Functionally intact  Sensory: Unimpaired  Sensory: Unimpaired  Palpation: No palpable anomalies  Palpation: No palpable anomalies   Assessment  Primary Diagnosis & Pertinent Problem List: The primary encounter diagnosis was Chronic pain syndrome. Diagnoses of Chronic low back pain (Location of Primary Source of Pain) (Bilateral) (L>R), Chronic hip pain, unspecified laterality, Chronic knee pain (Location of Tertiary source of pain) (Bilateral) (L>R), Lumbar facet syndrome (Bilateral) (R>L), Lumbar spondylosis, Long term prescription opiate use, Opiate use, Intermittent left thoracic Muscle cramps, Spasm of thoracolumbar muscle (Left), Post-traumatic osteoarthritis of knee (Left), Peripheral neuropathy, idiopathic (upper and lower extremity), Neurogenic pain, Osteoarthritis of knee  (Bilateral) (L>R), and Hemarthrosis involving knee joint, left were also pertinent to this visit.  Status Diagnosis  Controlled Controlled Controlled 1. Chronic pain syndrome   2. Chronic low back pain (Location of Primary Source of Pain) (Bilateral) (L>R)   3. Chronic hip pain, unspecified laterality   4. Chronic knee pain (Location of Tertiary source of pain) (Bilateral) (L>R)   5. Lumbar facet syndrome (Bilateral) (R>L)   6. Lumbar spondylosis   7. Long term prescription opiate use   8. Opiate use   9. Intermittent left thoracic Muscle cramps   10. Spasm of thoracolumbar muscle (Left)   11. Post-traumatic osteoarthritis of knee (Left)   12. Peripheral neuropathy, idiopathic (upper and lower extremity)   13. Neurogenic pain   14. Osteoarthritis of knee (Bilateral) (L>R)   15. Hemarthrosis involving knee joint, left      Plan of Care  Pharmacotherapy (Medications Ordered): Meds ordered this encounter  Medications  . tiZANidine (ZANAFLEX) 4 MG capsule    Sig: Take 1 capsule (4 mg total) by mouth 3 (three) times daily as needed for muscle spasms.    Dispense:  90 capsule    Refill:  2    Do not place this medication, or any other prescription from our practice, on "Automatic Refill". Patient may have prescription filled one day early if pharmacy is closed on scheduled refill date.  . pregabalin (LYRICA) 75 MG capsule    Sig: Take 1 capsule (75 mg total) by mouth 3 (three) times daily.    Dispense:  270 capsule    Refill:  0    Do not add this medication to the electronic "Automatic Refill" notification system. Patient may have prescription filled one day early if pharmacy is closed on scheduled refill date.  Marland Kitchen HYDROcodone-acetaminophen (NORCO/VICODIN) 5-325 MG tablet    Sig: Take 1 tablet by mouth every 12 (twelve) hours as needed for moderate pain.    Dispense:  60 tablet    Refill:  0    Patient may have prescription filled one day early if pharmacy is closed on scheduled  refill date. Do not fill until: 11/10/16 To last until: 12/10/16  . HYDROcodone-acetaminophen (NORCO/VICODIN) 5-325 MG tablet    Sig: Take 1 tablet by mouth every 12 (twelve) hours as needed for moderate pain.    Dispense:  60 tablet    Refill:  0    Patient may have prescription filled one day early if pharmacy is closed on scheduled refill date. Do not fill until: 12/10/16 To last until:  01/09/17  . DISCONTD: HYDROcodone-acetaminophen (NORCO/VICODIN) 5-325 MG tablet    Sig: Take 1 tablet by mouth every 6 (six) hours as needed for moderate pain.    Dispense:  120 tablet    Refill:  0    Patient may have prescription filled one day early if pharmacy is closed on scheduled refill date. Do not fill until: 01/09/17 To last until: 02/08/17  . HYDROcodone-acetaminophen (NORCO/VICODIN) 5-325 MG tablet    Sig: Take 1 tablet by mouth every 12 (twelve) hours as needed for moderate pain.    Dispense:  60 tablet    Refill:  0    Patient may have prescription filled one day early if pharmacy is closed on scheduled refill date. Do not fill until: 01/09/17 To last until: 02/08/17   New Prescriptions   No medications on file   Medications administered today: Ms. Bowlds had no medications administered during this visit. Lab-work, procedure(s), and/or referral(s): Orders Placed This Encounter  Procedures  . Radiofrequency,Lumbar  . DG Knee 1-2 Views Left  . MR KNEE LEFT WO CONTRAST   Imaging and/or referral(s): DG KNEE 1-2 VIEWS LEFT MR KNEE LEFT WO CONTRAST  Interventional therapies: Planned, scheduled, and/or pending:   Therapeutic Right Lumbar Facet RFA   Considering:   Diagnostic bilateral lumbar facet block #3 Possible bilateral lumbar facet radiofrequency ablation  Possible bilateral diagnostic sacroiliac joint block  Possible bilateral intra-articular hip joint injection  Possible bilateral intra-articular knee joint injection Possible bilateral genicular nerve block  Possible  bilateral knee joint radiofrequency ablation.  Possible left-sided lumbar epidural steroid injection Possible right-sided cervical epidural steroid injection Possible bilateral diagnostic cervical facet block Possible bilateral diagnostic greater occipital nerve blocks Possible greater occipital nerve radiofrequency ablation.   Palliative PRN treatment(s):   Palliative bilateral lumbar facet radiofrequency ablation    Provider-requested follow-up: Return in about 3 months (around 02/09/2017) for Med-Mgmt, w/ NP, in addition, Procedure: Right L-FCT RFA, (ASAP), by MD.  Future Appointments Date Time Provider [place]  12/07/2016 1:00 PM Alisa Graff, FNP [facility] None  12/15/2016 10:45 AM Milinda Pointer, MD [facility] None  12/23/2016 4:20 PM Minna Merritts, MD [facility]  02/09/2017 12:45 PM [place], NP [facility] None   Primary Care Physician: Denton Lank, MD Location: [facility] [facility] Note by: Kathlen Brunswick. Dossie Arbour, M.D, DABA, DABAPM, DABPM, DABIPP, FIPP Date: 11/10/2016; Time: 4:13 PM  Patient instructions provided during this appointment: Patient Instructions  Preparing for Procedure with Sedation Instructions: . Oral Intake: Do not eat or drink anything for at least 8 hours prior to your procedure. . Transportation: Public transportation is not allowed. Bring an adult driver. The driver must be physically present in our waiting room before any procedure can be started. Marland Kitchen Physical Assistance: Bring an adult physically capable of assisting you, in the event you need help. This adult should keep you company at home for at least 6 hours after the procedure. . Blood Pressure Medicine: Take your blood pressure medicine with a sip of water the morning of the procedure. . Blood thinners:  . Diabetics on insulin: Notify the staff so that you can be scheduled 1st case in the morning. If your diabetes requires high dose insulin, take only   of your normal insulin dose the morning of the procedure and notify the staff that you have done so. . Preventing infections: Shower with an antibacterial soap the morning of your procedure. . Build-up your immune system: Take 1000 mg of Vitamin  C with every meal (3 times a day) the day prior to your procedure. Marland Kitchen Antibiotics: Inform the staff if you have a condition or reason that requires you to take antibiotics before dental procedures. . Pregnancy: If you are pregnant, call and cancel the procedure. . Sickness: If you have a cold, fever, or any active infections, call and cancel the procedure. . Arrival: You must be in the facility at least 30 minutes prior to your scheduled procedure. . Children: Do not bring children with you. . Dress appropriately: Bring dark clothing that you would not mind if they get stained. . Valuables: Do not bring any jewelry or valuables. Procedure appointments are reserved for interventional treatments only. Marland Kitchen No Prescription Refills. . No medication changes will be discussed during procedure appointments. . No disability issues will be discussed. ______________________________________________________________________________________________  Radiofrequency Lesioning Radiofrequency lesioning is a procedure that is performed to relieve pain. The procedure is often used for back, neck, or arm pain. Radiofrequency lesioning involves the use of a machine that creates radio waves to make heat. During the procedure, the heat is applied to the nerve that carries the pain signal. The heat damages the nerve and interferes with the pain signal. Pain relief usually starts about 2 weeks after the procedure and lasts for 6 months to 1 year. Tell a health care provider about:  Any allergies you have.  All medicines you are taking, including vitamins, herbs, eye drops, creams, and over-the-counter medicines.  Any problems you or family members have had with anesthetic  medicines.  Any blood disorders you have.  Any surgeries you have had.  Any medical conditions you have.  Whether you are pregnant or may be pregnant. What are the risks? Generally, this is a safe procedure. However, problems may occur, including:  Pain or soreness at the injection site.  Infection at the injection site.  Damage to nerves or blood vessels. What happens before the procedure?  Ask your health care provider about:  Changing or stopping your regular medicines. This is especially important if you are taking diabetes medicines or blood thinners.  Taking medicines such as aspirin and ibuprofen. These medicines can thin your blood. Do not take these medicines before your procedure if your health care provider instructs you not to.  Follow instructions from your health care provider about eating or drinking restrictions.  Plan to have someone take you home after the procedure.  If you go home right after the procedure, plan to have someone with you for 24 hours. What happens during the procedure?  You will be given one or more of the following:  A medicine to help you relax (sedative).  A medicine to numb the area (local anesthetic).  You will be awake during the procedure. You will need to be able to talk with the health care provider during the procedure.  With the help of a type of X-ray (fluoroscopy), the health care provider will insert a radiofrequency needle into the area to be treated.  Next, a wire that carries the radio waves (electrode) will be put through the radiofrequency needle. An electrical pulse will be sent through the electrode to verify the correct nerve. You will feel a tingling sensation, and you may have muscle twitching.  Then, the tissue that is around the needle tip will be heated by an electric current that is passed using the radiofrequency machine. This will numb the nerves.  A bandage (dressing) will be put on the insertion area  after  the procedure is done. The procedure may vary among health care providers and hospitals. What happens after the procedure?  Your blood pressure, heart rate, breathing rate, and blood oxygen level will be monitored often until the medicines you were given have worn off.  Return to your normal activities as directed by your health care provider. This information is not intended to replace advice given to you by your health care provider. Make sure you discuss any questions you have with your health care provider. Document Released: 03/03/2011 Document Revised: 12/11/2015 Document Reviewed: 08/12/2014 Elsevier Interactive Patient Education  2017 [employer].

## 2016-11-12 ENCOUNTER — Telehealth: Payer: Self-pay | Admitting: Pain Medicine

## 2016-11-12 NOTE — Telephone Encounter (Signed)
[facility] lvmail stating they need prior auth from medicaid to fill Norco script for patient. Please let them know when it is done.

## 2016-11-12 NOTE — Telephone Encounter (Signed)
Spoke with pharmacy to let them know that PA was sent this morning 11/12/16 and the length of therapy had been initially sent in as 365 and now has been corrected to 180 days.

## 2016-11-18 ENCOUNTER — Ambulatory Visit: Payer: Medicaid Other | Admitting: Pain Medicine

## 2016-11-22 IMAGING — CR DG LUMBAR SPINE COMPLETE W/ BEND
1 series · 6 of 6 positions shown · non-contrast
Comparison: November 25, 2015

CLINICAL DATA: Lumbago, progressive

EXAM:
LUMBAR SPINE - COMPLETE WITH BENDING VIEWS

[Series 1: dg lumbar spine complete w/bend 6+v · 0.14mm/px · 6 of 6 slices shown]
[im 1/6]
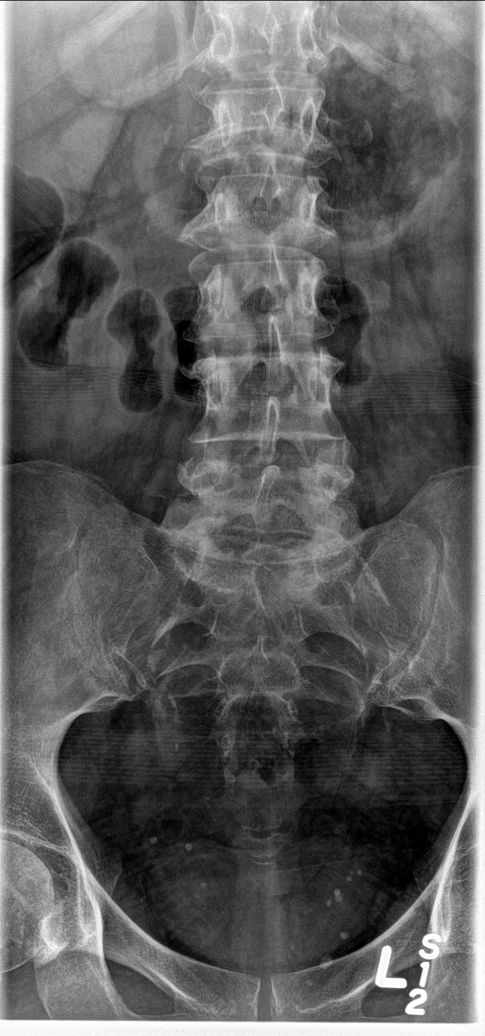
[im 2/6]
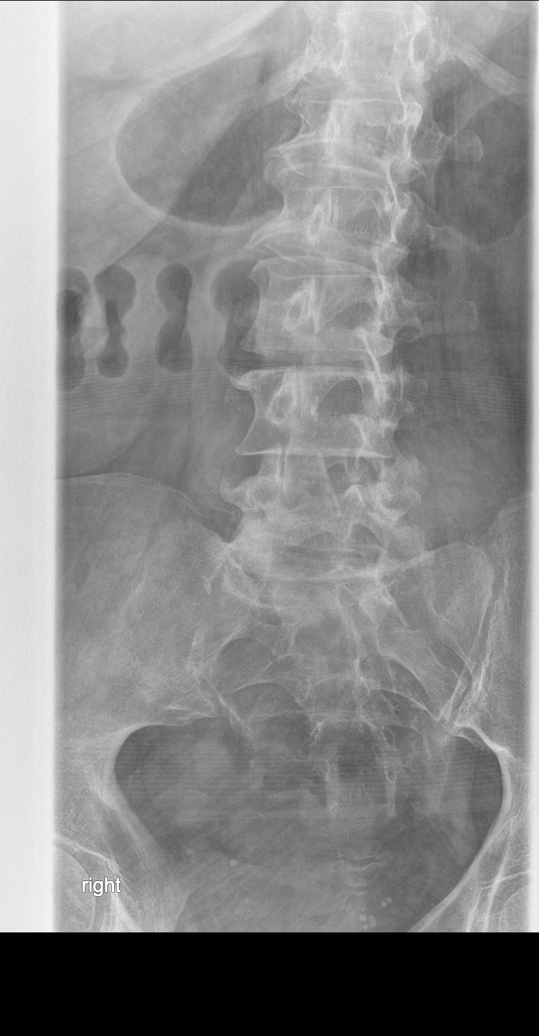
[im 3/6]
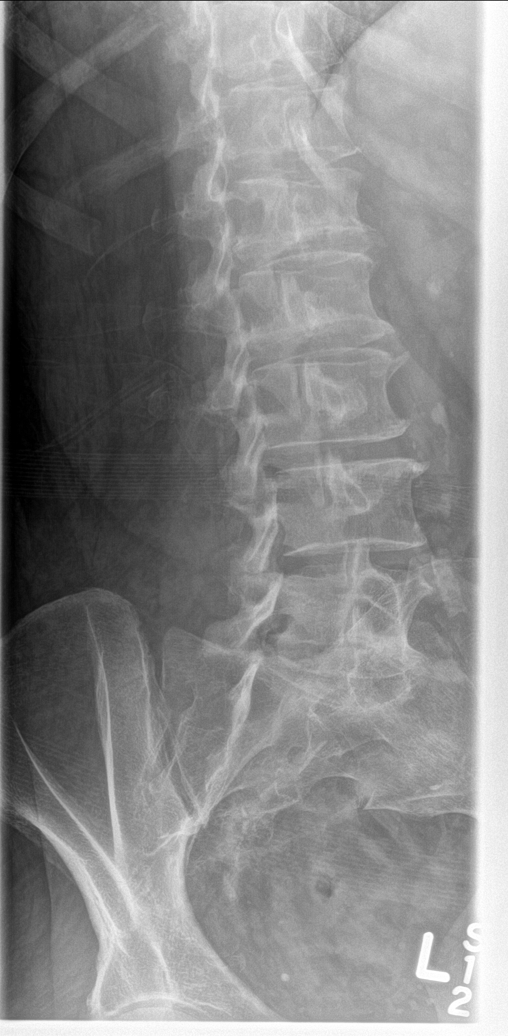
[im 4/6]
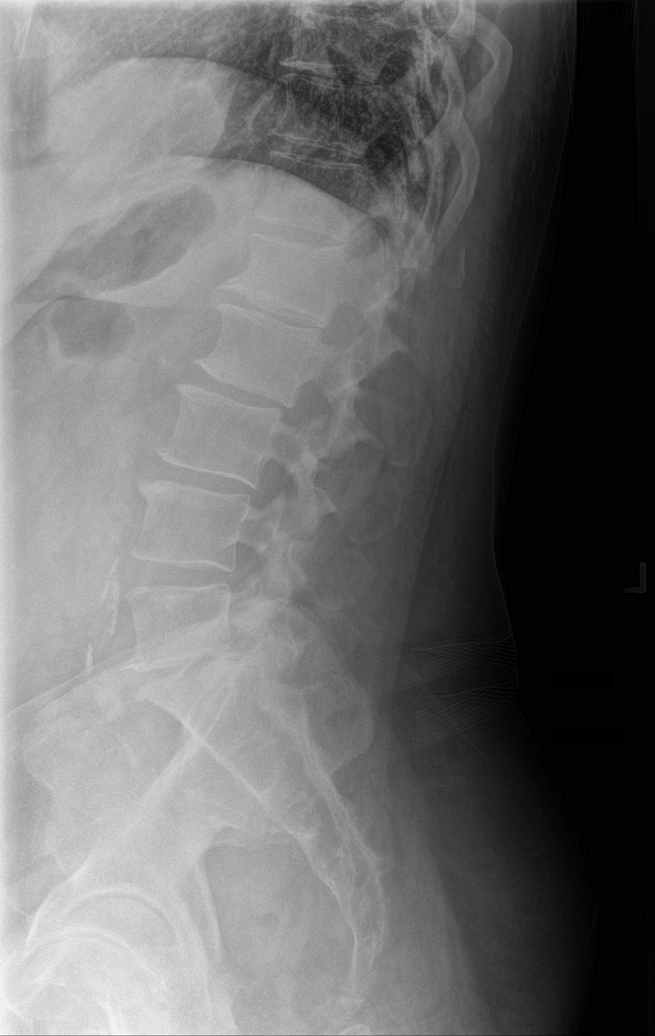
[im 5/6]
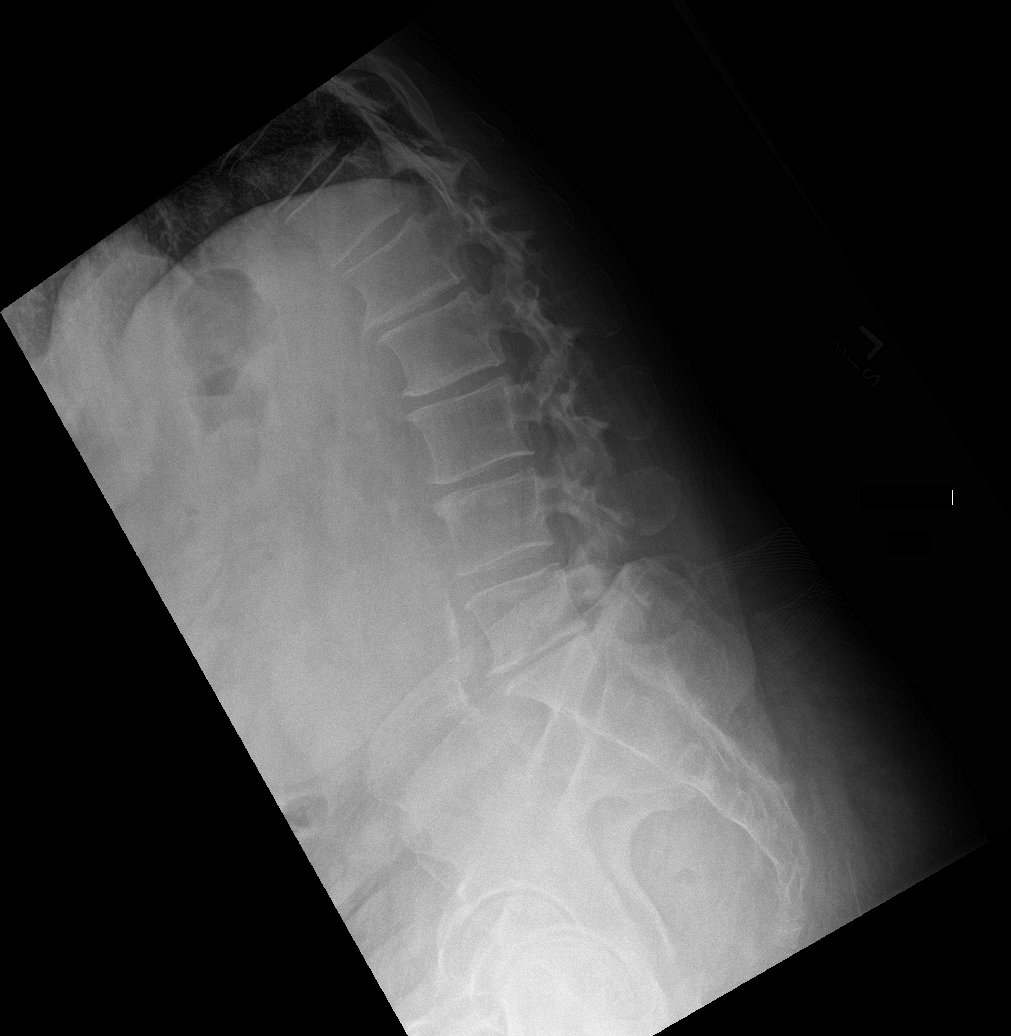
[im 6/6]
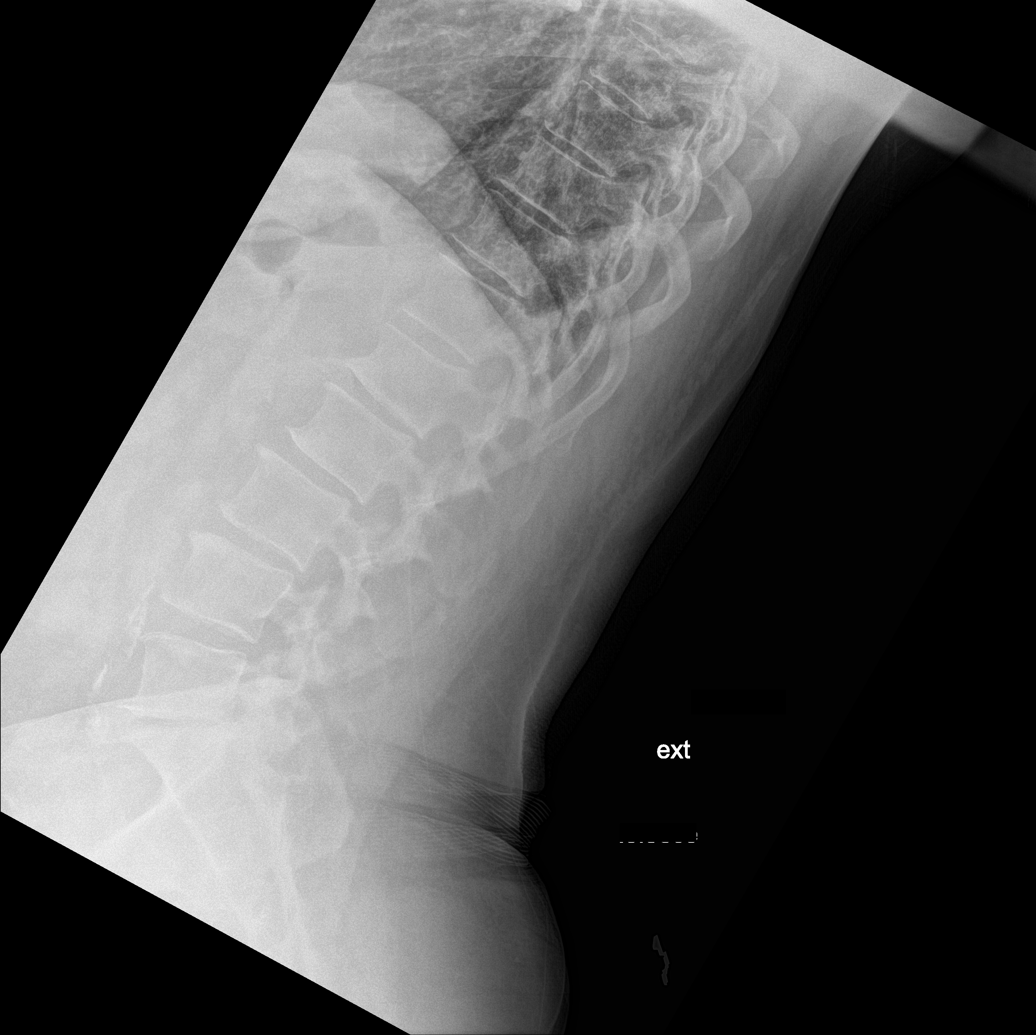

[6 of 6 positions shown; findings below may reference images not displayed]

FINDINGS: Frontal, standing neutral lateral, standing flexion lateral,
standing extension lateral, and bilateral oblique views were
obtained. There are 5 non-rib-bearing lumbar type vertebral bodies.
There is no appreciable fracture. There is 2 mm of retrolisthesis of
L2 on L3 with neutral and extension lateral imaging. There is no
spondylolisthesis at this site with flexion. No spondylolisthesis is
seen elsewhere. There is moderately severe disc space narrowing at
L5-S1. There is moderate disc space narrowing at L1-2 and slightly
milder disc space narrowing at L2-3. There are anterior osteophytes
at all levels. There is facet osteoarthritic change at L5-S1
bilaterally. There is calcification in the aorta and iliac arteries.
IMPRESSION: No fracture. With neutral lateral and extension lateral imaging,
there is 2 mm of retrolisthesis of L2 on L3. There is no
spondylolisthesis at L2-3 with flexion. No spondylolisthesis seen
elsewhere. Osteoarthritic changes noted, most marked at L5-S1. There
is aortoiliac atherosclerosis.
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
## 2016-11-25 DIAGNOSIS — L989 Disorder of the skin and subcutaneous tissue, unspecified: Secondary | ICD-10-CM | POA: Insufficient documentation

## 2016-12-07 ENCOUNTER — Ambulatory Visit: Payer: Medicaid Other | Attending: Family | Admitting: Family

## 2016-12-07 ENCOUNTER — Encounter: Payer: Self-pay | Admitting: Family

## 2016-12-07 VITALS — BP 136/80 | HR 76 | Resp 20 | Ht 68.0 in | Wt 206.4 lb

## 2016-12-07 DIAGNOSIS — M1990 Unspecified osteoarthritis, unspecified site: Secondary | ICD-10-CM | POA: Insufficient documentation

## 2016-12-07 DIAGNOSIS — I110 Hypertensive heart disease with heart failure: Secondary | ICD-10-CM | POA: Insufficient documentation

## 2016-12-07 DIAGNOSIS — Z87891 Personal history of nicotine dependence: Secondary | ICD-10-CM | POA: Diagnosis not present

## 2016-12-07 DIAGNOSIS — G4733 Obstructive sleep apnea (adult) (pediatric): Secondary | ICD-10-CM | POA: Insufficient documentation

## 2016-12-07 DIAGNOSIS — I272 Pulmonary hypertension, unspecified: Secondary | ICD-10-CM | POA: Insufficient documentation

## 2016-12-07 DIAGNOSIS — I5032 Chronic diastolic (congestive) heart failure: Secondary | ICD-10-CM | POA: Diagnosis present

## 2016-12-07 DIAGNOSIS — Z7951 Long term (current) use of inhaled steroids: Secondary | ICD-10-CM | POA: Diagnosis not present

## 2016-12-07 DIAGNOSIS — Z888 Allergy status to other drugs, medicaments and biological substances status: Secondary | ICD-10-CM | POA: Diagnosis not present

## 2016-12-07 DIAGNOSIS — Z8711 Personal history of peptic ulcer disease: Secondary | ICD-10-CM | POA: Diagnosis not present

## 2016-12-07 DIAGNOSIS — Z801 Family history of malignant neoplasm of trachea, bronchus and lung: Secondary | ICD-10-CM | POA: Diagnosis not present

## 2016-12-07 DIAGNOSIS — E785 Hyperlipidemia, unspecified: Secondary | ICD-10-CM | POA: Insufficient documentation

## 2016-12-07 DIAGNOSIS — Z803 Family history of malignant neoplasm of breast: Secondary | ICD-10-CM | POA: Diagnosis not present

## 2016-12-07 DIAGNOSIS — Z880 Allergy status to penicillin: Secondary | ICD-10-CM | POA: Insufficient documentation

## 2016-12-07 DIAGNOSIS — J449 Chronic obstructive pulmonary disease, unspecified: Secondary | ICD-10-CM | POA: Diagnosis not present

## 2016-12-07 DIAGNOSIS — Z7982 Long term (current) use of aspirin: Secondary | ICD-10-CM | POA: Diagnosis not present

## 2016-12-07 DIAGNOSIS — Z79899 Other long term (current) drug therapy: Secondary | ICD-10-CM | POA: Insufficient documentation

## 2016-12-07 NOTE — Progress Notes (Signed)
Patient ID: Erica [name], female    DOB: 1966-01-12, 51 y.o.   MRN: [id number]  HPI  Erica [name] is a 51 y/o female with a history of obstructive sleep apnea, HTN, hyperlipidemia, COPD, asthma, pulmonary HTN, chronic tobacco use and chronic heart failure.  Last echo was done on 06/14/15 and showed an EF of 60-65% with moderately elevated PA pressure at 56 mm Hg. Last PFT's were done 04/19/16. Recently had a chest CT done on 04/27/16 due to ground glass infiltrate.   In the ED on 06/14/16 due to HF exacerbation after being out of her lasix for previous 5 days. Treated with IV lasix and discharged home. Previously seen in the ER on 10/03/15 with an upper respiratory tract infection. She was treated with prednisone taper and released. Last admission was on 06/13/15 due to COPD exacerbation and hyponatremia. Had a bronchoscopy done 06/20/15. Was treated with antibiotics, nebulizer, hypertonic solution and bicarbonate drip with improvement of her sodium.   She presents today for a follow-up visit with a chief complaint of chronic shortness of breath with minimal exertion. She describes this as chronic in nature over the last several months with waxing/waning of severity. She has associated fatigue, fever, cough, wheezing and dizziness  Past Medical History:  Diagnosis Date  . Arthritis   . Asthma   . CHF (congestive heart failure) (HCC)   . COPD (chronic obstructive pulmonary disease) (HCC)   . Gastric ulcer   . Hyperlipidemia   . Hypertension   . OSA on CPAP no ins - no cpap machine for the last 4 years   Past Surgical History:  Procedure Laterality Date  . FLEXIBLE BRONCHOSCOPY N/A 06/20/2015   Procedure: FLEXIBLE BRONCHOSCOPY;  Surgeon: Shane Crutch, MD;  Location: [facility] ORS;  Service: Pulmonary;  Laterality: N/A;  . KNEE SURGERY Left    8 knee surgeries   Family History  Problem Relation Age of Onset  . Lung cancer Father   . Breast cancer Mother        early 48's  . Diabetes  Maternal Grandmother    Social History  Substance Use Topics  . Smoking status: Former Smoker    Packs/day: 1.00    Years: 36.00    Types: Cigarettes    Quit date: 06/13/2015  . Smokeless tobacco: Never Used  . Alcohol use 0.0 oz/week     Comment: occasionaly   Allergies  Allergen Reactions  . Tramadol Other (See Comments)    Heart palpatations  . Penicillins Rash   Prior to Admission medications   Medication Sig Start Date End Date Taking? Authorizing Provider  albuterol (PROAIR HFA) 108 (90 Base) MCG/ACT inhaler Inhale into the lungs 4 (four) times daily.   Yes Historical Provider, MD  aspirin EC 81 MG EC tablet Take 1 tablet (81 mg total) by mouth daily. 06/23/15  Yes Shaune Pollack, MD  atorvastatin (LIPITOR) 40 MG tablet Take 40 mg by mouth daily.   Yes Historical Provider, MD  Cholecalciferol (VITAMIN D3) 2000 units capsule Take 1 capsule (2,000 Units total) by mouth daily. 06/29/16  Yes Delano Metz, MD  diltiazem (CARDIZEM) 30 MG tablet Take 1 tablet (30 mg total) by mouth 3 (three) times daily as needed. 04/22/16  Yes Antonieta Iba, MD  DULoxetine (CYMBALTA) 60 MG capsule Take 60 mg by mouth daily.   Yes Historical Provider, MD  fluconazole (DIFLUCAN) 150 MG tablet take 1 tablet by mouth as a single dose as needed 08/23/16  Yes Historical  Provider, MD  Fluticasone-Salmeterol (ADVAIR DISKUS IN) Inhale into the lungs 2 (two) times daily.   Yes Historical Provider, MD  folic acid (FOLVITE) 1 MG tablet Take 1 mg by mouth daily.   Yes Historical Provider, MD  furosemide (LASIX) 40 MG tablet Take 1 tablet (40 mg total) by mouth twice daily. Hold if SBP<110 or DBP<60 Patient taking differently: Take 40 mg by mouth daily. Hold if SBP<110 or DBP<60 07/16/15  Yes Delma Freeze, FNP  HYDROcodone-acetaminophen (NORCO/VICODIN) 5-325 MG tablet Take 1 tablet by mouth every 12 (twelve) hours as needed for moderate pain. 08/31/16 09/30/16 Yes Delano Metz, MD  pantoprazole (PROTONIX) 40  MG tablet Take 1 tablet (40 mg total) by mouth daily. 06/23/15  Yes Shaune Pollack, MD  potassium chloride SA (K-DUR,KLOR-CON) 20 MEQ tablet Take 20 mEq by mouth 2 (two) times daily.   Yes Historical Provider, MD  pregabalin (LYRICA) 50 MG capsule Take 1 capsule (50 mg total) by mouth 3 (three) times daily. 06/29/16 08/31/16 Yes Delano Metz, MD  sucralfate (CARAFATE) 1 G tablet Take 1 tablet (1 g total) by mouth 4 (four) times daily -  with meals and at bedtime. 06/23/15  Yes Shaune Pollack, MD  tiotropium (SPIRIVA HANDIHALER) 18 MCG inhalation capsule Place 18 mcg into inhaler and inhale daily.   Yes Historical Provider, MD    Review of Systems  Constitutional: Positive for fatigue and fever. Negative for appetite change.  HENT: Positive for voice change (hoarseness).   Eyes: Negative.   Respiratory: Positive for cough, shortness of breath and wheezing. Negative for chest tightness.   Cardiovascular: Negative for chest pain, palpitations and leg swelling.  Gastrointestinal: Negative for abdominal distention and abdominal pain.  Endocrine: Negative.   Genitourinary: Negative.   Musculoskeletal: Positive for back pain. Negative for neck pain.  Skin: Negative.   Allergic/Immunologic: Negative.   Neurological: Positive for dizziness. Negative for light-headedness.  Hematological: Negative for adenopathy. Does not bruise/bleed easily.  Psychiatric/Behavioral: Positive for sleep disturbance (chronic trouble sleeping; wearing oxygen @ 2L at bedtime). Negative for dysphoric mood and suicidal ideas. The patient is not nervous/anxious.    Vitals:   12/07/16 1302  BP: 136/80  Pulse: 76  Resp: 20  SpO2: 100%  Weight: 206 lb 6 oz (93.6 kg)  Height: 5\' 8"  (1.727 m)   Wt Readings from Last 3 Encounters:  12/07/16 206 lb 6 oz (93.6 kg)  11/10/16 208 lb (94.3 kg)  09/29/16 205 lb (93 kg)   Lab Results  Component Value Date   CREATININE 0.95 06/14/2016   CREATININE 0.65 04/22/2016   CREATININE 0.73  06/23/2015    Physical Exam  Constitutional: She is oriented to person, place, and time. She appears well-developed and well-nourished.  HENT:  Head: Normocephalic and atraumatic.  Neck: Normal range of motion. Neck supple. No JVD present.  Cardiovascular: Normal rate and regular rhythm.   Pulmonary/Chest: Effort normal. She has no wheezes. She has no rales.  Abdominal: Soft. She exhibits no distension. There is no tenderness.  Musculoskeletal: She exhibits no edema or tenderness.  Neurological: She is alert and oriented to person, place, and time.  Skin: Skin is warm and dry.  Psychiatric: She has a normal mood and affect. Her behavior is normal. Thought content normal.  Nursing note and vitals reviewed.     Assessment & Plan:  1: Chronic heart failure with preserved ejection fraction- - NYHA class III - euvolemic - continue daily weights. Reminded to call for an overnight weight  gain of >2 pounds or a weekly weight gain of >5 pounds - not adding salt to her food - monitor fluid intake and keep it <2L - unable to wear CPAP as it causes panic attacks. Does wear oxygen at 2L at bedtime - saw cardiologist Mariah Milling([place]) 09/24/16 and returns 12/23/16 - needs update echocardiogram; will order if not done at cardiologist's office  2: Pulmonary HTN- - saw pulmonologist Meredeth Ide([place]) 11/09/16 - wearing oxygent at 2L at bedtime - has been treated with a couple of rounds of antibiotics along with prednisone recently - taking cardizem TID PRN and notices that her blood pressure has been fluctuating; waiting on BP cuff to arrive so that she can check it at home - has not smoked since October 2017  Patient did not bring her medications nor a list. Each medication was verbally reviewed with the patient and she was encouraged to bring the bottles to every visit to confirm accuracy of list.  Return in 3 months or sooner for any questions/problems before then.

## 2016-12-07 NOTE — Patient Instructions (Signed)
Continue weighing daily and call for an overnight weight gain of > 2 pounds or a weekly weight gain of >5 pounds. 

## 2016-12-10 ENCOUNTER — Ambulatory Visit
Admission: RE | Admit: 2016-12-10 | Discharge: 2016-12-10 | Disposition: A | Discharge status: Home / Self Care | Payer: Medicaid Other | Source: Ambulatory Visit | Attending: Pain Medicine | Admitting: Pain Medicine

## 2016-12-10 DIAGNOSIS — M25561 Pain in right knee: Secondary | ICD-10-CM | POA: Diagnosis not present

## 2016-12-10 DIAGNOSIS — M25562 Pain in left knee: Secondary | ICD-10-CM | POA: Diagnosis not present

## 2016-12-10 DIAGNOSIS — G8929 Other chronic pain: Secondary | ICD-10-CM | POA: Diagnosis not present

## 2016-12-10 DIAGNOSIS — M170 Bilateral primary osteoarthritis of knee: Secondary | ICD-10-CM

## 2016-12-10 DIAGNOSIS — M25462 Effusion, left knee: Secondary | ICD-10-CM | POA: Insufficient documentation

## 2016-12-10 DIAGNOSIS — M25062 Hemarthrosis, left knee: Secondary | ICD-10-CM

## 2016-12-15 ENCOUNTER — Ambulatory Visit
Admission: RE | Admit: 2016-12-15 | Discharge: 2016-12-15 | Disposition: A | Discharge status: Home / Self Care | Payer: Medicaid Other | Source: Ambulatory Visit | Attending: Pain Medicine | Admitting: Pain Medicine

## 2016-12-15 ENCOUNTER — Ambulatory Visit (HOSPITAL_BASED_OUTPATIENT_CLINIC_OR_DEPARTMENT_OTHER): Payer: Medicaid Other | Admitting: Pain Medicine

## 2016-12-15 ENCOUNTER — Encounter: Payer: Self-pay | Admitting: Pain Medicine

## 2016-12-15 VITALS — BP 148/76 | HR 89 | Temp 97.5°F | Resp 18 | Ht 68.0 in | Wt 200.0 lb

## 2016-12-15 DIAGNOSIS — G8929 Other chronic pain: Secondary | ICD-10-CM | POA: Diagnosis not present

## 2016-12-15 DIAGNOSIS — M47816 Spondylosis without myelopathy or radiculopathy, lumbar region: Secondary | ICD-10-CM | POA: Insufficient documentation

## 2016-12-15 DIAGNOSIS — G8918 Other acute postprocedural pain: Secondary | ICD-10-CM | POA: Diagnosis not present

## 2016-12-15 DIAGNOSIS — M545 Low back pain, unspecified: Secondary | ICD-10-CM

## 2016-12-15 DIAGNOSIS — Z880 Allergy status to penicillin: Secondary | ICD-10-CM | POA: Diagnosis not present

## 2016-12-15 DIAGNOSIS — M4696 Unspecified inflammatory spondylopathy, lumbar region: Secondary | ICD-10-CM

## 2016-12-15 MED ORDER — LACTATED RINGERS IV SOLN
1000.0000 mL | Freq: Once | INTRAVENOUS | Status: AC
  Administered 2016-12-15: 1000 mL via INTRAVENOUS

## 2016-12-15 MED ORDER — FENTANYL CITRATE (PF) 100 MCG/2ML IJ SOLN
25.0000 ug | INTRAMUSCULAR | Status: DC | PRN
Start: 2016-12-15 — End: 2016-12-15
  Administered 2016-12-15: 100 ug via INTRAVENOUS
  Filled 2016-12-15: qty 2

## 2016-12-15 MED ORDER — MIDAZOLAM HCL 5 MG/5ML IJ SOLN
1.0000 mg | INTRAMUSCULAR | Status: DC | PRN
  Administered 2016-12-15: 4 mg via INTRAVENOUS
  Filled 2016-12-15: qty 5

## 2016-12-15 MED ORDER — OXYCODONE-ACETAMINOPHEN 5-325 MG PO TABS: 1.0000 | ORAL_TABLET | Freq: Three times a day (TID) | ORAL | 0 refills | Status: DC | PRN

## 2016-12-15 MED ORDER — LIDOCAINE HCL (PF) 1 % IJ SOLN
10.0000 mL | Freq: Once | INTRAMUSCULAR | Status: AC
  Administered 2016-12-15: 5 mL
  Filled 2016-12-15: qty 10

## 2016-12-15 MED ORDER — ROPIVACAINE HCL 2 MG/ML IJ SOLN
9.0000 mL | Freq: Once | INTRAMUSCULAR | Status: AC
  Administered 2016-12-15: 10 mL via PERINEURAL
  Filled 2016-12-15: qty 10

## 2016-12-15 MED ORDER — TRIAMCINOLONE ACETONIDE 40 MG/ML IJ SUSP
40.0000 mg | Freq: Once | INTRAMUSCULAR | Status: AC
  Administered 2016-12-15: 40 mg
  Filled 2016-12-15: qty 1

## 2016-12-15 NOTE — Progress Notes (Signed)
Safety precautions to be maintained throughout the outpatient stay will include: orient to surroundings, keep bed in low position, maintain call bell within reach at all times, provide assistance with transfer out of bed and ambulation.  

## 2016-12-15 NOTE — Patient Instructions (Addendum)
____________________________________________________________________________________________  Post-Procedure instructions Instructions:  Apply ice: Fill a plastic sandwich bag with crushed ice. Cover it with a small towel and apply to injection site. Apply for 15 minutes then remove x 15 minutes. Repeat sequence on day of procedure, until you go to bed. The purpose is to minimize swelling and discomfort after procedure.  Apply heat: Apply heat to procedure site starting the day following the procedure. The purpose is to treat any soreness and discomfort from the procedure.  Food intake: Start with clear liquids (like water) and advance to regular food, as tolerated.   Physical activities: Keep activities to a minimum for the first 8 hours after the procedure.   Driving: If you have received any sedation, you are not allowed to drive for 24 hours after your procedure.  Blood thinner: Restart your blood thinner 6 hours after your procedure. (Only for those taking blood thinners)  Insulin: As soon as you can eat, you may resume your normal dosing schedule. (Only for those taking insulin)  Infection prevention: Keep procedure site clean and dry.  Post-procedure Pain Diary: Extremely important that this be done correctly and accurately. Recorded information will be used to determine the next step in treatment.  Pain evaluated is that of treated area only. Do not include pain from an untreated area.  Complete every hour, on the hour, for the initial 8 hours. Set an alarm to help you do this part accurately.  Do not go to sleep and have it completed later. It will not be accurate.  Follow-up appointment: Keep your follow-up appointment after the procedure. Usually 2 weeks for most procedures. (6 weeks in the case of radiofrequency.) Bring you pain diary.  Expect:  From numbing medicine (AKA: Local Anesthetics): Numbness or decrease in pain.  Onset: Full effect within 15 minutes of  injected.  Duration: It will depend on the type of local anesthetic used. On the average, 1 to 8 hours.   From steroids: Decrease in swelling or inflammation. Once inflammation is improved, relief of the pain will follow.  Onset of benefits: Depends on the amount of swelling present. The more swelling, the longer it will take for the benefits to be seen.   Duration: Steroids will stay in the system x 2 weeks. Duration of benefits will depend on multiple posibilities including persistent irritating factors.  From procedure: Some discomfort is to be expected once the numbing medicine wears off. This should be minimal if ice and heat are applied as instructed. Call if:  You experience numbness and weakness that gets worse with time, as opposed to wearing off.  New onset bowel or bladder incontinence. (Spinal procedures only)  Emergency Numbers:  Durning business hours (Monday - Thursday, 8:00 AM - 4:00 PM) (Friday, 9:00 AM - 12:00 Noon): [phone]  After hours: [phone] ____________________________________________________________________________________________  Pain Management Discharge Instructions  General Discharge Instructions :  If you need to reach your doctor call: Monday-Friday 8:00 am - 4:00 pm at [phone] or toll free [phone].  After clinic hours [phone] to have operator reach doctor.  Bring all of your medication bottles to all your appointments in the pain clinic.  To cancel or reschedule your appointment with Pain Management please remember to call 24 hours in advance to avoid a fee.  Refer to the educational materials which you have been given on: General Risks, I had my Procedure. Discharge Instructions, Post Sedation.  Post Procedure Instructions:  The drugs you were given will stay in your  system until tomorrow, so for the next 24 hours you should not drive, make any legal decisions or drink any alcoholic beverages.  You may eat  anything you prefer, but it is better to start with liquids then soups and crackers, and gradually work up to solid foods.  Please notify your doctor immediately if you have any unusual bleeding, trouble breathing or pain that is not related to your normal pain.  Depending on the type of procedure that was done, some parts of your body may feel week and/or numb.  This usually clears up by tonight or the next day.  Walk with the use of an assistive device or accompanied by an adult for the 24 hours.  You may use ice on the affected area for the first 24 hours.  Put ice in a Ziploc bag and cover with a towel and place against area 15 minutes on 15 minutes off.  You may switch to heat after 24 hours.Radiofrequency Lesioning, Care After Refer to this sheet in the next few weeks. These instructions provide you with information about caring for yourself after your procedure. Your health care provider may also give you more specific instructions. Your treatment has been planned according to current medical practices, but problems sometimes occur. Call your health care provider if you have any problems or questions after your procedure. What can I expect after the procedure? After the procedure, it is common to have:  Pain from the burned nerve.  Temporary numbness. Follow these instructions at home:  Take over-the-counter and prescription medicines only as told by your health care provider.  Return to your normal activities as told by your health care provider. Ask your health care provider what activities are safe for you.  Pay close attention to how you feel after the procedure. If you start to have pain, write down when it hurts and how it feels. This will help you and your health care provider to know if you need an additional treatment.  Check your needle insertion site every day for signs of infection. Watch for:  Redness, swelling, or pain.  Fluid, blood, or pus.  Keep all follow-up  visits as told by your health care provider. This is important. Contact a health care provider if:  Your pain does not get better.  You have redness, swelling, or pain at the needle insertion site.  You have fluid, blood, or pus coming from the needle insertion site.  You have a fever. Get help right away if:  You develop sudden, severe pain.  You develop numbness or tingling near the procedure site that does not go away. This information is not intended to replace advice given to you by your health care provider. Make sure you discuss any questions you have with your health care provider. Document Released: 03/04/2011 Document Revised: 12/11/2015 Document Reviewed: 08/12/2014 Elsevier Interactive Patient Education  2017 [employer] Radiofrequency Lesioning Radiofrequency lesioning is a procedure that is performed to relieve pain. The procedure is often used for back, neck, or arm pain. Radiofrequency lesioning involves the use of a machine that creates radio waves to make heat. During the procedure, the heat is applied to the nerve that carries the pain signal. The heat damages the nerve and interferes with the pain signal. Pain relief usually starts about 2 weeks after the procedure and lasts for 6 months to 1 year. Tell a health care provider about:  Any allergies you have.  All medicines you are taking, including vitamins, herbs,  eye drops, creams, and over-the-counter medicines.  Any problems you or family members have had with anesthetic medicines.  Any blood disorders you have.  Any surgeries you have had.  Any medical conditions you have.  Whether you are pregnant or may be pregnant. What are the risks? Generally, this is a safe procedure. However, problems may occur, including:  Pain or soreness at the injection site.  Infection at the injection site.  Damage to nerves or blood vessels. What happens before the procedure?  Ask your health care provider  about:  Changing or stopping your regular medicines. This is especially important if you are taking diabetes medicines or blood thinners.  Taking medicines such as aspirin and ibuprofen. These medicines can thin your blood. Do not take these medicines before your procedure if your health care provider instructs you not to.  Follow instructions from your health care provider about eating or drinking restrictions.  Plan to have someone take you home after the procedure.  If you go home right after the procedure, plan to have someone with you for 24 hours. What happens during the procedure?  You will be given one or more of the following:  A medicine to help you relax (sedative).  A medicine to numb the area (local anesthetic).  You will be awake during the procedure. You will need to be able to talk with the health care provider during the procedure.  With the help of a type of X-ray (fluoroscopy), the health care provider will insert a radiofrequency needle into the area to be treated.  Next, a wire that carries the radio waves (electrode) will be put through the radiofrequency needle. An electrical pulse will be sent through the electrode to verify the correct nerve. You will feel a tingling sensation, and you may have muscle twitching.  Then, the tissue that is around the needle tip will be heated by an electric current that is passed using the radiofrequency machine. This will numb the nerves.  A bandage (dressing) will be put on the insertion area after the procedure is done. The procedure may vary among health care providers and hospitals. What happens after the procedure?  Your blood pressure, heart rate, breathing rate, and blood oxygen level will be monitored often until the medicines you were given have worn off.  Return to your normal activities as directed by your health care provider. This information is not intended to replace advice given to you by your health care  provider. Make sure you discuss any questions you have with your health care provider. Document Released: 03/03/2011 Document Revised: 12/11/2015 Document Reviewed: 08/12/2014 Elsevier Interactive Patient Education  2017 [employer]  Facet Joint Block, Care After Refer to this sheet in the next few weeks. These instructions provide you with information about caring for yourself after your procedure. Your health care provider may also give you more specific instructions. Your treatment has been planned according to current medical practices, but problems sometimes occur. Call your health care provider if you have any problems or questions after your procedure. What can I expect after the procedure? After the procedure, it is common to have:  Some tenderness over the injection sites for 2 days after the procedure.  A temporary increase in blood sugar if you have diabetes. Follow these instructions at home:  Keep track of the amount of pain relief you feel and how long it lasts.  Take over-the-counter and prescription medicines only as told by your health care provider. You  may need to limit pain medicine within the first 4-6 hours after the procedure.  Remove your bandages (dressings) the morning after the procedure.  For the first 24 hours after the procedure:  Do not apply heat near or over the injection sites.  Do not take a bath or soak in water, such as in a pool or lake.  Do not drive or operate heavy machinery unless approved by your health care provider.  Avoid activities that require a lot of energy.  If the injection site is tender, try applying ice to the area. To do this:  Put ice in a plastic bag.  Place a towel between your skin and the bag.  Leave the ice on for 20 minutes, 2-3 times a day.  Keep all follow-up visits as told by your health care provider. This is important. Contact a health care provider if:  Fluid is coming from an injection site.  There is  significant bleeding or swelling at an injection site.  You have diabetes and your blood sugar is above 180 mg/dL. Get help right away if:  You have a fever.  You have worsening pain or swelling around an injection site.  There are red streaks around an injection site.  You develop severe pain that is not controlled by your medicines.  You develop a headache, stiff neck, nausea, or vomiting.  Your eyes become very sensitive to light.  You have weakness, paralysis, or tingling in your arms or legs that was not present before the procedure.  You have difficulty urinating or breathing. This information is not intended to replace advice given to you by your health care provider. Make sure you discuss any questions you have with your health care provider. Document Released: 06/21/2012 Document Revised: 11/19/2015 Document Reviewed: 03/31/2015 Elsevier Interactive Patient Education  2017 [employer]  Facet Joint Block The facet joints connect the bones of the spine (vertebrae). They make it possible for you to bend, twist, and make other movements with your spine. They also keep you from bending too far, twisting too far, and making other excessive movements. A facet joint block is a procedure where a numbing medicine (anesthetic) is injected into a facet joint. Often, a type of anti-inflammatory medicine called a steroid is also injected. A facet joint block may be done to diagnose neck or back pain. If the pain gets better after a facet joint block, it means the pain is probably coming from the facet joint. If the pain does not get better, it means the pain is probably not coming from the facet joint. A facet joint block may also be done to relieve neck or back pain caused by an inflamed facet joint. A facet joint block is only done to relieve pain if the pain does not improve with other methods, such as medicine, exercise programs, and physical therapy. Tell a health care provider  about:  Any allergies you have.  All medicines you are taking, including vitamins, herbs, eye drops, creams, and over-the-counter medicines.  Any problems you or family members have had with anesthetic medicines.  Any blood disorders you have.  Any surgeries you have had.  Any medical conditions you have.  Whether you are pregnant or may be pregnant. What are the risks? Generally, this is a safe procedure. However, problems may occur, including:  Bleeding.  Injury to a nerve near the injection site.  Pain at the injection site.  Weakness or numbness in areas controlled by nerves near the  injection site.  Infection.  Temporary fluid retention.  Allergic reactions to medicines or dyes.  Injury to other structures or organs near the injection site. What happens before the procedure?  Follow instructions from your health care provider about eating or drinking restrictions.  Ask your health care provider about:  Changing or stopping your regular medicines. This is especially important if you are taking diabetes medicines or blood thinners.  Taking medicines such as aspirin and ibuprofen. These medicines can thin your blood. Do not take these medicines before your procedure if your health care provider instructs you not to.  Do not take any new dietary supplements or medicines without asking your health care provider first.  Plan to have someone take you home after the procedure. What happens during the procedure?  You may need to remove your clothing and dress in an open-back gown.  The procedure will be done while you are lying on an X-ray table. You will most likely be asked to lie on your stomach, but you may be asked to lie in a different position if an injection will be made in your neck.  Machines will be used to monitor your oxygen levels, heart rate, and blood pressure.  If an injection will be made in your neck, an IV tube will be inserted into one of your  veins. Fluids and medicine will flow directly into your body through the IV tube.  The area over the facet joint where the injection will be made will be cleaned with soap. The surrounding skin will be covered with clean drapes.  A numbing medicine (local anesthetic) will be applied to your skin. Your skin may sting or burn for a moment.  A video X-ray machine (fluoroscopy) will be used to locate the joint. In some cases, a CT scan may be used.  A contrast dye may be injected into the facet joint area to help locate the joint.  When the joint is located, an anesthetic will be injected into the joint through the needle.  Your health care provider will ask you whether you feel pain relief. If you do feel relief, a steroid may be injected to provide pain relief for a longer period of time. If you do not feel relief or feel only partial relief, additional injections of an anesthetic may be made in other facet joints.  The needle will be removed.  Your skin will be cleaned.  A bandage (dressing) will be applied over each injection site. The procedure may vary among health care providers and hospitals. What happens after the procedure?  You will be observed for 15-30 minutes before being allowed to go home. This information is not intended to replace advice given to you by your health care provider. Make sure you discuss any questions you have with your health care provider. Document Released: 11/24/2006 Document Revised: 08/06/2015 Document Reviewed: 03/31/2015 Elsevier Interactive Patient Education  2017 [employer] GENERAL RISKS AND COMPLICATIONS  What are the risk, side effects and possible complications? Generally speaking, most procedures are safe.  However, with any procedure there are risks, side effects, and the possibility of complications.  The risks and complications are dependent upon the sites that are lesioned, or the type of nerve block to be performed.  The closer the  procedure is to the spine, the more serious the risks are.  Great care is taken when placing the radio frequency needles, block needles or lesioning probes, but sometimes complications can occur. 1. Infection: Any time  there is an injection through the skin, there is a risk of infection.  This is why sterile conditions are used for these blocks.  There are four possible types of infection. 1. Localized skin infection. 2. Central Nervous System Infection-This can be in the form of Meningitis, which can be deadly. 3. Epidural Infections-This can be in the form of an epidural abscess, which can cause pressure inside of the spine, causing compression of the spinal cord with subsequent paralysis. This would require an emergency surgery to decompress, and there are no guarantees that the patient would recover from the paralysis. 4. Discitis-This is an infection of the intervertebral discs.  It occurs in about 1% of discography procedures.  It is difficult to treat and it may lead to surgery.        2. Pain: the needles have to go through skin and soft tissues, will cause soreness.       3. Damage to internal structures:  The nerves to be lesioned may be near blood vessels or    other nerves which can be potentially damaged.       4. Bleeding: Bleeding is more common if the patient is taking blood thinners such as  aspirin, Coumadin, Ticiid, Plavix, etc., or if he/she have some genetic predisposition  such as hemophilia. Bleeding into the spinal canal can cause compression of the spinal  cord with subsequent paralysis.  This would require an emergency surgery to  decompress and there are no guarantees that the patient would recover from the  paralysis.       5. Pneumothorax:  Puncturing of a lung is a possibility, every time a needle is introduced in  the area of the chest or upper back.  Pneumothorax refers to free air around the  collapsed lung(s), inside of the thoracic cavity (chest cavity).  Another two  possible  complications related to a similar event would include: Hemothorax and Chylothorax.   These are variations of the Pneumothorax, where instead of air around the collapsed  lung(s), you may have blood or chyle, respectively.       6. Spinal headaches: They may occur with any procedures in the area of the spine.       7. Persistent CSF (Cerebro-Spinal Fluid) leakage: This is a rare problem, but may occur  with prolonged intrathecal or epidural catheters either due to the formation of a fistulous  track or a dural tear.       8. Nerve damage: By working so close to the spinal cord, there is always a possibility of  nerve damage, which could be as serious as a permanent spinal cord injury with  paralysis.       9. Death:  Although rare, severe deadly allergic reactions known as "Anaphylactic  reaction" can occur to any of the medications used.      10. Worsening of the symptoms:  We can always make thing worse.  What are the chances of something like this happening? Chances of any of this occuring are extremely low.  By statistics, you have more of a chance of getting killed in a motor vehicle accident: while driving to the hospital than any of the above occurring .  Nevertheless, you should be aware that they are possibilities.  In general, it is similar to taking a shower.  Everybody knows that you can slip, hit your head and get killed.  Does that mean that you should not shower again?  Nevertheless always keep in mind  that statistics do not mean anything if you happen to be on the wrong side of them.  Even if a procedure has a 1 (one) in a 1,000,000 (million) chance of going wrong, it you happen to be that one..Also, keep in mind that by statistics, you have more of a chance of having something go wrong when taking medications.  Who should not have this procedure? If you are on a blood thinning medication (e.g. Coumadin, Plavix, see list of "Blood Thinners"), or if you have an active infection  going on, you should not have the procedure.  If you are taking any blood thinners, please inform your physician.  How should I prepare for this procedure?  Do not eat or drink anything at least six hours prior to the procedure.  Bring a driver with you .  It cannot be a taxi.  Come accompanied by an adult that can drive you back, and that is strong enough to help you if your legs get weak or numb from the local anesthetic.  Take all of your medicines the morning of the procedure with just enough water to swallow them.  If you have diabetes, make sure that you are scheduled to have your procedure done first thing in the morning, whenever possible.  If you have diabetes, take only half of your insulin dose and notify our nurse that you have done so as soon as you arrive at the clinic.  If you are diabetic, but only take blood sugar pills (oral hypoglycemic), then do not take them on the morning of your procedure.  You may take them after you have had the procedure.  Do not take aspirin or any aspirin-containing medications, at least eleven (11) days prior to the procedure.  They may prolong bleeding.  Wear loose fitting clothing that may be easy to take off and that you would not mind if it got stained with Betadine or blood.  Do not wear any jewelry or perfume  Remove any nail coloring.  It will interfere with some of our monitoring equipment.  NOTE: Remember that this is not meant to be interpreted as a complete list of all possible complications.  Unforeseen problems may occur.  BLOOD THINNERS The following drugs contain aspirin or other products, which can cause increased bleeding during surgery and should not be taken for 2 weeks prior to and 1 week after surgery.  If you should need take something for relief of minor pain, you may take acetaminophen which is found in Tylenol,m Datril, Anacin-3 and Panadol. It is not blood thinner. The products listed below are.  Do not take any of  the products listed below in addition to any listed on your instruction sheet.  A.P.C or A.P.C with Codeine Codeine Phosphate Capsules #3 Ibuprofen Ridaura  ABC compound Congesprin Imuran rimadil  Advil Cope Indocin Robaxisal  Alka-Seltzer Effervescent Pain Reliever and Antacid Coricidin or Coricidin-D  Indomethacin Rufen  Alka-Seltzer plus Cold Medicine Cosprin Ketoprofen S-A-C Tablets  Anacin Analgesic Tablets or Capsules Coumadin Korlgesic Salflex  Anacin Extra Strength Analgesic tablets or capsules CP-2 Tablets Lanoril Salicylate  Anaprox Cuprimine Capsules Levenox Salocol  Anexsia-D Dalteparin Magan Salsalate  Anodynos Darvon compound Magnesium Salicylate Sine-off  Ansaid Dasin Capsules Magsal Sodium Salicylate  Anturane Depen Capsules Marnal Soma  APF Arthritis pain formula Dewitt's Pills Measurin Stanback  Argesic Dia-Gesic Meclofenamic Sulfinpyrazone  Arthritis Bayer Timed Release Aspirin Diclofenac Meclomen Sulindac  Arthritis pain formula Anacin Dicumarol Medipren Supac  Analgesic (Safety coated) Arthralgen  Diffunasal Mefanamic Suprofen  Arthritis Strength Bufferin Dihydrocodeine Mepro Compound Suprol  Arthropan liquid Dopirydamole Methcarbomol with Aspirin Synalgos  ASA tablets/Enseals Disalcid Micrainin Tagament  Ascriptin Doan's Midol Talwin  Ascriptin A/D Dolene Mobidin Tanderil  Ascriptin Extra Strength Dolobid Moblgesic Ticlid  Ascriptin with Codeine Doloprin or Doloprin with Codeine Momentum Tolectin  Asperbuf Duoprin Mono-gesic Trendar  Aspergum Duradyne Motrin or Motrin IB Triminicin  Aspirin plain, buffered or enteric coated Durasal Myochrisine Trigesic  Aspirin Suppositories Easprin Nalfon Trillsate  Aspirin with Codeine Ecotrin Regular or Extra Strength Naprosyn Uracel  Atromid-S Efficin Naproxen Ursinus  Auranofin Capsules Elmiron Neocylate Vanquish  Axotal Emagrin Norgesic Verin  Azathioprine Empirin or Empirin with Codeine Normiflo Vitamin E  Azolid  Emprazil Nuprin Voltaren  Bayer Aspirin plain, buffered or children's or timed BC Tablets or powders Encaprin Orgaran Warfarin Sodium  Buff-a-Comp Enoxaparin Orudis Zorpin  Buff-a-Comp with Codeine Equegesic Os-Cal-Gesic   Buffaprin Excedrin plain, buffered or Extra Strength Oxalid   Bufferin Arthritis Strength Feldene Oxphenbutazone   Bufferin plain or Extra Strength Feldene Capsules Oxycodone with Aspirin   Bufferin with Codeine Fenoprofen Fenoprofen Pabalate or Pabalate-SF   Buffets II Flogesic Panagesic   Buffinol plain or Extra Strength Florinal or Florinal with Codeine Panwarfarin   Buf-Tabs Flurbiprofen Penicillamine   Butalbital Compound Four-way cold tablets Penicillin   Butazolidin Fragmin Pepto-Bismol   Carbenicillin Geminisyn Percodan   Carna Arthritis Reliever Geopen Persantine   Carprofen Gold's salt Persistin   Chloramphenicol Goody's Phenylbutazone   Chloromycetin Haltrain Piroxlcam   Clmetidine heparin Plaquenil   Cllnoril Hyco-pap Ponstel   Clofibrate Hydroxy chloroquine Propoxyphen         Before stopping any of these medications, be sure to consult the physician who ordered them.  Some, such as Coumadin (Warfarin) are ordered to prevent or treat serious conditions such as "deep thrombosis", "pumonary embolisms", and other heart problems.  The amount of time that you may need off of the medication may also vary with the medication and the reason for which you were taking it.  If you are taking any of these medications, please make sure you notify your pain physician before you undergo any procedures.

## 2016-12-15 NOTE — Progress Notes (Signed)
Patient's Name: Erica [name]  MRN: [id number]  Referring Provider: Delano Metz, MD  DOB: [birth year]  PCP: Erica Aldo, MD  DOS: 12/15/2016  Note by: Erica [name]. Erica Emperor, MD  Service setting: Ambulatory outpatient  Location: [facility] (AMB) Pain Management Facility  Visit type: Procedure  Specialty: Interventional Pain Management  Patient type: Established   Primary Reason for Visit: Interventional Pain Management Treatment. CC: Back Pain (low and right) and Hip Pain (left)  Procedure:  Anesthesia, Analgesia, Anxiolysis:  Type: Therapeutic Medial Branch Facet Radiofrequency Ablation Region: Lumbar Level: L2, L3, L4, L5, & S1 Medial Branch Level(s) Laterality: Right-Sided  Type: Local Anesthesia with Moderate (Conscious) Sedation Local Anesthetic: Lidocaine 1% Route: Intravenous (IV) IV Access: Secured Sedation: Meaningful verbal contact was maintained at all times during the procedure  Indication(s): Analgesia and Anxiety  Indications: 1. Lumbar facet syndrome (Bilateral) (R>L)   2. Lumbar spondylosis   3. Chronic low back pain (Location of Primary Source of Pain) (Bilateral) (L>R)   4. Acute postoperative pain    Erica [name] has either failed to respond, was unable to tolerate, or simply did not get enough benefit from other more conservative therapies including, but not limited to: 1. Over-the-counter medications 2. Anti-inflammatory medications 3. Muscle relaxants 4. Membrane stabilizers 5. Opioids 6. Physical therapy 7. Modalities (Heat, ice, etc.) 8. Invasive techniques such as nerve blocks. Erica [name] has attained more than 50% relief of the pain from a series of diagnostic injections conducted in separate occasions.  Pain Score: Pre-procedure: 4 /10 Post-procedure: 0-No pain/10  Pre-op Assessment:  Previous date of service: 11/10/16 Service provided: Evaluation, Med Refill Erica [name] is a 51 y.o. (year old), female patient, seen today for interventional treatment.  She  has a past surgical history that includes Knee surgery (Left) and Flexible bronchoscopy (N/A, 06/20/2015). Her primarily concern today is the Back Pain (low and right) and Hip Pain (left)  Initial Vital Signs: Blood pressure (!) 161/84, pulse 89, temperature 98.3 F (36.8 C), temperature source Oral, resp. rate 18, height 5\' 8"  (1.727 m), weight 200 lb (90.7 kg), last menstrual period 03/13/2015, SpO2 94 %. BMI: 30.41 kg/m  Risk Assessment: Allergies: Reviewed. She is allergic to tramadol and penicillins.  Allergy Precautions: None required Coagulopathies: Reviewed. None identified.  Blood-thinner therapy: None at this time Active Infection(s): Reviewed. None identified. Erica [name] is afebrile  Site Confirmation: Erica [name] was asked to confirm the procedure and laterality before marking the site Procedure checklist: Completed Consent: Before the procedure and under the influence of no sedative(s), amnesic(s), or anxiolytics, the patient was informed of the treatment options, risks and possible complications. To fulfill our ethical and legal obligations, as recommended by the American Medical Association's Code of Ethics, I have informed the patient of my clinical impression; the nature and purpose of the treatment or procedure; the risks, benefits, and possible complications of the intervention; the alternatives, including doing nothing; the risk(s) and benefit(s) of the alternative treatment(s) or procedure(s); and the risk(s) and benefit(s) of doing nothing. The patient was provided information about the general risks and possible complications associated with the procedure. These may include, but are not limited to: failure to achieve desired goals, infection, bleeding, organ or nerve damage, allergic reactions, paralysis, and death. In addition, the patient was informed of those risks and complications associated to Spine-related procedures, such as failure to decrease pain; infection  (i.e.: Meningitis, epidural or intraspinal abscess); bleeding (i.e.: epidural hematoma, subarachnoid hemorrhage, or any other type of intraspinal or peri-dural  bleeding); organ or nerve damage (i.e.: Any type of peripheral nerve, nerve root, or spinal cord injury) with subsequent damage to sensory, motor, and/or autonomic systems, resulting in permanent pain, numbness, and/or weakness of one or several areas of the body; allergic reactions; (i.e.: anaphylactic reaction); and/or death. Furthermore, the patient was informed of those risks and complications associated with the medications. These include, but are not limited to: allergic reactions (i.e.: anaphylactic or anaphylactoid reaction(s)); adrenal axis suppression; blood sugar elevation that in diabetics may result in ketoacidosis or comma; water retention that in patients with history of congestive heart failure may result in shortness of breath, pulmonary edema, and decompensation with resultant heart failure; weight gain; swelling or edema; medication-induced neural toxicity; particulate matter embolism and blood vessel occlusion with resultant organ, and/or nervous system infarction; and/or aseptic necrosis of one or more joints. Finally, the patient was informed that Medicine is not an exact science; therefore, there is also the possibility of unforeseen or unpredictable risks and/or possible complications that may result in a catastrophic outcome. The patient indicated having understood very clearly. We have given the patient no guarantees and we have made no promises. Enough time was given to the patient to ask questions, all of which were answered to the patient's satisfaction. Erica [name] has indicated that she wanted to continue with the procedure. Attestation: I, the ordering provider, attest that I have discussed with the patient the benefits, risks, side-effects, alternatives, likelihood of achieving goals, and potential problems during recovery  for the procedure that I have provided informed consent. Date: 12/15/2016; Time: 11:02 AM  Pre-Procedure Preparation:  Monitoring: As per clinic protocol. Respiration, ETCO2, SpO2, BP, heart rate and rhythm monitor placed and checked for adequate function Safety Precautions: Patient was assessed for positional comfort and pressure points before starting the procedure. Time-out: I initiated and conducted the "Time-out" before starting the procedure, as per protocol. The patient was asked to participate by confirming the accuracy of the "Time Out" information. Verification of the correct person, site, and procedure were performed and confirmed by me, the nursing staff, and the patient. "Time-out" conducted as per Joint Commission's Universal Protocol (UP.01.01.01). "Time-out" Date & Time: 12/15/2016; 1132 hrs.  Description of Procedure Process:   Position: Prone Target Area: For Lumbar Facet blocks, the target is the groove formed by the junction of the transverse process and superior articular process. For the L5 dorsal ramus, the target is the notch between superior articular process and sacral ala. For the S1 dorsal ramus, the target is the superior and lateral edge of the posterior S1 Sacral foramen. Approach: Paraspinal approach. Area Prepped: Entire Posterior Lumbosacral Region Prepping solution: Hibiclens (4.0% Chlorhexidine gluconate solution) Safety Precautions: Aspiration looking for blood return was conducted prior to all injections. At no point did we inject any substances, as a needle was being advanced. No attempts were made at seeking any paresthesias. Safe injection practices and needle disposal techniques used. Medications properly checked for expiration dates. SDV (single dose vial) medications used. Description of the Procedure: Protocol guidelines were followed. The patient was placed in position over the fluoroscopy table. The target area was identified and the area prepped in the  usual manner. Skin desensitized using vapocoolant spray. Skin & deeper tissues infiltrated with local anesthetic. Appropriate amount of time allowed to pass for local anesthetics to take effect. Radiofrequency needles were introduced to the area of the medial branch at the junction of the superior articular process and transverse process using fluoroscopy. Using the [employer]  Radiofrequency Generator, sensory stimulation using 50 Hz was used to locate & identify the nerve, making sure that the needle was positioned such that there was no sensory stimulation below 0.3 V or above 0.7 V. Stimulation using 2 Hz was used to evaluate the motor component. Care was taken not to lesion any nerves that demonstrated motor stimulation of the lower extremities at an output of less than 2.5 times that of the sensory threshold, or a maximum of 2.0 V. Once satisfactory placement of the needles was achieved, the above solution was slowly injected after negative aspiration. After waiting for at least 2 minutes, the ablation was performed at 80 degrees C for 60 seconds.The needles were then removed and the area cleansed, making sure to leave some of the prepping solution back to take advantage of its long term bactericidal properties. Vitals:   12/15/16 1213 12/15/16 1223 12/15/16 1233 12/15/16 1243  BP: 140/89 (!) 150/77 (!) 144/80 (!) 148/76  Pulse:      Resp: (!) 22 (!) 23 (!) 24 18  Temp:  97.5 F (36.4 C)    TempSrc:      SpO2: 96% 96% 97% 96%  Weight:      Height:        Start Time: 1133 hrs. End Time: 1214 hrs. Materials & Medications:  Needle(s) Type: Teflon-coated, curved tip, Radiofrequency needle(s) Gauge: 22G Length: 10cm Medication(s): We administered lactated ringers, midazolam, fentaNYL, triamcinolone acetonide, lidocaine (PF), and ropivacaine (PF) 2 mg/mL (0.2%). Please see chart orders for dosing details.  Imaging Guidance (Spinal):  Type of Imaging Technique: Fluoroscopy Guidance  (Spinal) Indication(s): Assistance in needle guidance and placement for procedures requiring needle placement in or near specific anatomical locations not easily accessible without such assistance. Exposure Time: Please see nurses notes. Contrast: None used. Fluoroscopic Guidance: I was personally present during the use of fluoroscopy. "Tunnel Vision Technique" used to obtain the best possible view of the target area. Parallax error corrected before commencing the procedure. "Direction-depth-direction" technique used to introduce the needle under continuous pulsed fluoroscopy. Once target was reached, antero-posterior, oblique, and lateral fluoroscopic projection used confirm needle placement in all planes. Images permanently stored in EMR. Interpretation: No contrast injected. I personally interpreted the imaging intraoperatively. Adequate needle placement confirmed in multiple planes. Permanent images saved into the patient's record.  Antibiotic Prophylaxis:  Indication(s): None identified Antibiotic given: None  Post-operative Assessment:  EBL: None Complications: No immediate post-treatment complications observed by team, or reported by patient. Note: The patient tolerated the entire procedure well. A repeat set of vitals were taken after the procedure and the patient was kept under observation following institutional policy, for this type of procedure. Post-procedural neurological assessment was performed, showing return to baseline, prior to discharge. The patient was provided with post-procedure discharge instructions, including a section on how to identify potential problems. Should any problems arise concerning this procedure, the patient was given instructions to immediately contact us, at any time, without hesitation. In any case, we plan to contact the patient by telephone for a follow-up status report regarding this interventional procedure. Comments:  No additional relevant  information.  Plan of Care  Disposition: Discharge home  Discharge Date & Time: 12/15/2016; 1243 hrs.  Physician-requested Follow-up:  No Follow-up on file.  Future Appointments Date Time Provider Department Center  12/23/2016 4:20 PM Antonieta Iba, MD [facility]  02/09/2017 12:45 PM Barbette Merino, NP [facility] None  03/09/2017 1:20 PM Delma Freeze, FNP [facility] None   Medications  ordered for procedure: Meds ordered this encounter  Medications  . lactated ringers infusion 1,000 mL  . midazolam (VERSED) 5 MG/5ML injection 1-2 mg    Make sure Flumazenil is available in the pyxis when using this medication. If oversedation occurs, administer 0.2 mg IV over 15 sec. If after 45 sec no response, administer 0.2 mg again over 1 min; may repeat at 1 min intervals; not to exceed 4 doses (1 mg)  . fentaNYL (SUBLIMAZE) injection 25-50 mcg    Make sure Narcan is available in the pyxis when using this medication. In the event of respiratory depression (RR< 8/min): Titrate NARCAN (naloxone) in increments of 0.1 to 0.2 mg IV at 2-3 minute intervals, until desired degree of reversal.  . triamcinolone acetonide (KENALOG-40) injection 40 mg  . lidocaine (PF) (XYLOCAINE) 1 % injection 10 mL  . ropivacaine (PF) 2 mg/mL (0.2%) (NAROPIN) injection 9 mL  . oxyCODONE-acetaminophen (PERCOCET) 5-325 MG tablet    Sig: Take 1 tablet by mouth every 8 (eight) hours as needed for severe pain.    Dispense:  30 tablet    Refill:  0    For acute post-operative pain. Not to be refilled. To last until: 12/25/16   Medications administered: We administered lactated ringers, midazolam, fentaNYL, triamcinolone acetonide, lidocaine (PF), and ropivacaine (PF) 2 mg/mL (0.2%).  See the medical record for exact dosing, route, and time of administration.  Lab-work, Procedure(s), & Referral(s) Ordered: Orders Placed This Encounter  Procedures  . DG C-Arm 1-60 Min-No Report  . Informed Consent Details:  Transcribe to consent form and obtain patient signature  . Provider attestation of informed consent for procedure/surgical case  . Verify informed consent  . Discharge instructions  . Follow-up   Imaging Ordered: Results for orders placed in visit on 09/29/16  DG C-Arm 1-60 Min-No Report   Narrative Fluoroscopy was utilized by the requesting physician.  No radiographic  interpretation.    New Prescriptions   OXYCODONE-ACETAMINOPHEN (PERCOCET) 5-325 MG TABLET    Take 1 tablet by mouth every 8 (eight) hours as needed for severe pain.   Primary Care Physician: Erica Aldo, MD Location: [facility] Note by: Erica [name]. Erica [name], M.D, DABA, DABAPM, DABPM, DABIPP, FIPP Date: 12/15/2016; Time: 1:10 PM  Disclaimer:  Medicine is not an exact science. The only guarantee in medicine is that nothing is guaranteed. It is important to note that the decision to proceed with this intervention was based on the information collected from the patient. The Data and conclusions were drawn from the patient's questionnaire, the interview, and the physical examination. Because the information was provided in large part by the patient, it cannot be guaranteed that it has not been purposely or unconsciously manipulated. Every effort has been made to obtain as much relevant data as possible for this evaluation. It is important to note that the conclusions that lead to this procedure are derived in large part from the available data. Always take into account that the treatment will also be dependent on availability of resources and existing treatment guidelines, considered by other Pain Management Practitioners as being common knowledge and practice, at the time of the intervention. For Medico-Legal purposes, it is also important to point out that variation in procedural techniques and pharmacological choices are the acceptable norm. The indications, contraindications, technique, and results of  the above procedure should only be interpreted and judged by a Board-Certified Interventional Pain Specialist with extensive familiarity and expertise in the same exact procedure and  technique.  Instructions provided at this appointment: Patient Instructions   ____________________________________________________________________________________________  Post-Procedure instructions Instructions:  Apply ice: Fill a plastic sandwich bag with crushed ice. Cover it with a small towel and apply to injection site. Apply for 15 minutes then remove x 15 minutes. Repeat sequence on day of procedure, until you go to bed. The purpose is to minimize swelling and discomfort after procedure.  Apply heat: Apply heat to procedure site starting the day following the procedure. The purpose is to treat any soreness and discomfort from the procedure.  Food intake: Start with clear liquids (like water) and advance to regular food, as tolerated.   Physical activities: Keep activities to a minimum for the first 8 hours after the procedure.   Driving: If you have received any sedation, you are not allowed to drive for 24 hours after your procedure.  Blood thinner: Restart your blood thinner 6 hours after your procedure. (Only for those taking blood thinners)  Insulin: As soon as you can eat, you may resume your normal dosing schedule. (Only for those taking insulin)  Infection prevention: Keep procedure site clean and dry.  Post-procedure Pain Diary: Extremely important that this be done correctly and accurately. Recorded information will be used to determine the next step in treatment.  Pain evaluated is that of treated area only. Do not include pain from an untreated area.  Complete every hour, on the hour, for the initial 8 hours. Set an alarm to help you do this part accurately.  Do not go to sleep and have it completed later. It will not be accurate.  Follow-up appointment: Keep your follow-up  appointment after the procedure. Usually 2 weeks for most procedures. (6 weeks in the case of radiofrequency.) Bring you pain diary.  Expect:  From numbing medicine (AKA: Local Anesthetics): Numbness or decrease in pain.  Onset: Full effect within 15 minutes of injected.  Duration: It will depend on the type of local anesthetic used. On the average, 1 to 8 hours.   From steroids: Decrease in swelling or inflammation. Once inflammation is improved, relief of the pain will follow.  Onset of benefits: Depends on the amount of swelling present. The more swelling, the longer it will take for the benefits to be seen.   Duration: Steroids will stay in the system x 2 weeks. Duration of benefits will depend on multiple posibilities including persistent irritating factors.  From procedure: Some discomfort is to be expected once the numbing medicine wears off. This should be minimal if ice and heat are applied as instructed. Call if:  You experience numbness and weakness that gets worse with time, as opposed to wearing off.  New onset bowel or bladder incontinence. (Spinal procedures only)  Emergency Numbers:  Durning business hours (Monday - Thursday, 8:00 AM - 4:00 PM) (Friday, 9:00 AM - 12:00 Noon): [phone]  After hours: [phone] ____________________________________________________________________________________________  Pain Management Discharge Instructions  General Discharge Instructions :  If you need to reach your doctor call: Monday-Friday 8:00 am - 4:00 pm at [phone] or toll free [phone].  After clinic hours [phone] to have operator reach doctor.  Bring all of your medication bottles to all your appointments in the pain clinic.  To cancel or reschedule your appointment with Pain Management please remember to call 24 hours in advance to avoid a fee.  Refer to the educational materials which you have been given on: General Risks, I had my Procedure.  Discharge Instructions, Post Sedation.  Post  Procedure Instructions:  The drugs you were given will stay in your system until tomorrow, so for the next 24 hours you should not drive, make any legal decisions or drink any alcoholic beverages.  You may eat anything you prefer, but it is better to start with liquids then soups and crackers, and gradually work up to solid foods.  Please notify your doctor immediately if you have any unusual bleeding, trouble breathing or pain that is not related to your normal pain.  Depending on the type of procedure that was done, some parts of your body may feel week and/or numb.  This usually clears up by tonight or the next day.  Walk with the use of an assistive device or accompanied by an adult for the 24 hours.  You may use ice on the affected area for the first 24 hours.  Put ice in a Ziploc bag and cover with a towel and place against area 15 minutes on 15 minutes off.  You may switch to heat after 24 hours.Radiofrequency Lesioning, Care After Refer to this sheet in the next few weeks. These instructions provide you with information about caring for yourself after your procedure. Your health care provider may also give you more specific instructions. Your treatment has been planned according to current medical practices, but problems sometimes occur. Call your health care provider if you have any problems or questions after your procedure. What can I expect after the procedure? After the procedure, it is common to have:  Pain from the burned nerve.  Temporary numbness. Follow these instructions at home:  Take over-the-counter and prescription medicines only as told by your health care provider.  Return to your normal activities as told by your health care provider. Ask your health care provider what activities are safe for you.  Pay close attention to how you feel after the procedure. If you start to have pain, write down when it hurts and how it  feels. This will help you and your health care provider to know if you need an additional treatment.  Check your needle insertion site every day for signs of infection. Watch for:  Redness, swelling, or pain.  Fluid, blood, or pus.  Keep all follow-up visits as told by your health care provider. This is important. Contact a health care provider if:  Your pain does not get better.  You have redness, swelling, or pain at the needle insertion site.  You have fluid, blood, or pus coming from the needle insertion site.  You have a fever. Get help right away if:  You develop sudden, severe pain.  You develop numbness or tingling near the procedure site that does not go away. This information is not intended to replace advice given to you by your health care provider. Make sure you discuss any questions you have with your health care provider. Document Released: 03/04/2011 Document Revised: 12/11/2015 Document Reviewed: 08/12/2014 Elsevier Interactive Patient Education  2017 [employer] Radiofrequency Lesioning Radiofrequency lesioning is a procedure that is performed to relieve pain. The procedure is often used for back, neck, or arm pain. Radiofrequency lesioning involves the use of a machine that creates radio waves to make heat. During the procedure, the heat is applied to the nerve that carries the pain signal. The heat damages the nerve and interferes with the pain signal. Pain relief usually starts about 2 weeks after the procedure and lasts for 6 months to 1 year. Tell a health care provider about:  Any allergies  you have.  All medicines you are taking, including vitamins, herbs, eye drops, creams, and over-the-counter medicines.  Any problems you or family members have had with anesthetic medicines.  Any blood disorders you have.  Any surgeries you have had.  Any medical conditions you have.  Whether you are pregnant or may be pregnant. What are the risks? Generally,  this is a safe procedure. However, problems may occur, including:  Pain or soreness at the injection site.  Infection at the injection site.  Damage to nerves or blood vessels. What happens before the procedure?  Ask your health care provider about:  Changing or stopping your regular medicines. This is especially important if you are taking diabetes medicines or blood thinners.  Taking medicines such as aspirin and ibuprofen. These medicines can thin your blood. Do not take these medicines before your procedure if your health care provider instructs you not to.  Follow instructions from your health care provider about eating or drinking restrictions.  Plan to have someone take you home after the procedure.  If you go home right after the procedure, plan to have someone with you for 24 hours. What happens during the procedure?  You will be given one or more of the following:  A medicine to help you relax (sedative).  A medicine to numb the area (local anesthetic).  You will be awake during the procedure. You will need to be able to talk with the health care provider during the procedure.  With the help of a type of X-ray (fluoroscopy), the health care provider will insert a radiofrequency needle into the area to be treated.  Next, a wire that carries the radio waves (electrode) will be put through the radiofrequency needle. An electrical pulse will be sent through the electrode to verify the correct nerve. You will feel a tingling sensation, and you may have muscle twitching.  Then, the tissue that is around the needle tip will be heated by an electric current that is passed using the radiofrequency machine. This will numb the nerves.  A bandage (dressing) will be put on the insertion area after the procedure is done. The procedure may vary among health care providers and hospitals. What happens after the procedure?  Your blood pressure, heart rate, breathing rate, and blood  oxygen level will be monitored often until the medicines you were given have worn off.  Return to your normal activities as directed by your health care provider. This information is not intended to replace advice given to you by your health care provider. Make sure you discuss any questions you have with your health care provider. Document Released: 03/03/2011 Document Revised: 12/11/2015 Document Reviewed: 08/12/2014 Elsevier Interactive Patient Education  2017 [employer]  Facet Joint Block, Care After Refer to this sheet in the next few weeks. These instructions provide you with information about caring for yourself after your procedure. Your health care provider may also give you more specific instructions. Your treatment has been planned according to current medical practices, but problems sometimes occur. Call your health care provider if you have any problems or questions after your procedure. What can I expect after the procedure? After the procedure, it is common to have:  Some tenderness over the injection sites for 2 days after the procedure.  A temporary increase in blood sugar if you have diabetes. Follow these instructions at home:  Keep track of the amount of pain relief you feel and how long it lasts.  Take over-the-counter and  prescription medicines only as told by your health care provider. You may need to limit pain medicine within the first 4-6 hours after the procedure.  Remove your bandages (dressings) the morning after the procedure.  For the first 24 hours after the procedure:  Do not apply heat near or over the injection sites.  Do not take a bath or soak in water, such as in a pool or lake.  Do not drive or operate heavy machinery unless approved by your health care provider.  Avoid activities that require a lot of energy.  If the injection site is tender, try applying ice to the area. To do this:  Put ice in a plastic bag.  Place a towel between  your skin and the bag.  Leave the ice on for 20 minutes, 2-3 times a day.  Keep all follow-up visits as told by your health care provider. This is important. Contact a health care provider if:  Fluid is coming from an injection site.  There is significant bleeding or swelling at an injection site.  You have diabetes and your blood sugar is above 180 mg/dL. Get help right away if:  You have a fever.  You have worsening pain or swelling around an injection site.  There are red streaks around an injection site.  You develop severe pain that is not controlled by your medicines.  You develop a headache, stiff neck, nausea, or vomiting.  Your eyes become very sensitive to light.  You have weakness, paralysis, or tingling in your arms or legs that was not present before the procedure.  You have difficulty urinating or breathing. This information is not intended to replace advice given to you by your health care provider. Make sure you discuss any questions you have with your health care provider. Document Released: 06/21/2012 Document Revised: 11/19/2015 Document Reviewed: 03/31/2015 Elsevier Interactive Patient Education  2017 [employer]  Facet Joint Block The facet joints connect the bones of the spine (vertebrae). They make it possible for you to bend, twist, and make other movements with your spine. They also keep you from bending too far, twisting too far, and making other excessive movements. A facet joint block is a procedure where a numbing medicine (anesthetic) is injected into a facet joint. Often, a type of anti-inflammatory medicine called a steroid is also injected. A facet joint block may be done to diagnose neck or back pain. If the pain gets better after a facet joint block, it means the pain is probably coming from the facet joint. If the pain does not get better, it means the pain is probably not coming from the facet joint. A facet joint block may also be done to  relieve neck or back pain caused by an inflamed facet joint. A facet joint block is only done to relieve pain if the pain does not improve with other methods, such as medicine, exercise programs, and physical therapy. Tell a health care provider about:  Any allergies you have.  All medicines you are taking, including vitamins, herbs, eye drops, creams, and over-the-counter medicines.  Any problems you or family members have had with anesthetic medicines.  Any blood disorders you have.  Any surgeries you have had.  Any medical conditions you have.  Whether you are pregnant or may be pregnant. What are the risks? Generally, this is a safe procedure. However, problems may occur, including:  Bleeding.  Injury to a nerve near the injection site.  Pain at the injection site.  Weakness or numbness in areas controlled by nerves near the injection site.  Infection.  Temporary fluid retention.  Allergic reactions to medicines or dyes.  Injury to other structures or organs near the injection site. What happens before the procedure?  Follow instructions from your health care provider about eating or drinking restrictions.  Ask your health care provider about:  Changing or stopping your regular medicines. This is especially important if you are taking diabetes medicines or blood thinners.  Taking medicines such as aspirin and ibuprofen. These medicines can thin your blood. Do not take these medicines before your procedure if your health care provider instructs you not to.  Do not take any new dietary supplements or medicines without asking your health care provider first.  Plan to have someone take you home after the procedure. What happens during the procedure?  You may need to remove your clothing and dress in an open-back gown.  The procedure will be done while you are lying on an X-ray table. You will most likely be asked to lie on your stomach, but you may be asked to lie  in a different position if an injection will be made in your neck.  Machines will be used to monitor your oxygen levels, heart rate, and blood pressure.  If an injection will be made in your neck, an IV tube will be inserted into one of your veins. Fluids and medicine will flow directly into your body through the IV tube.  The area over the facet joint where the injection will be made will be cleaned with soap. The surrounding skin will be covered with clean drapes.  A numbing medicine (local anesthetic) will be applied to your skin. Your skin may sting or burn for a moment.  A video X-ray machine (fluoroscopy) will be used to locate the joint. In some cases, a CT scan may be used.  A contrast dye may be injected into the facet joint area to help locate the joint.  When the joint is located, an anesthetic will be injected into the joint through the needle.  Your health care provider will ask you whether you feel pain relief. If you do feel relief, a steroid may be injected to provide pain relief for a longer period of time. If you do not feel relief or feel only partial relief, additional injections of an anesthetic may be made in other facet joints.  The needle will be removed.  Your skin will be cleaned.  A bandage (dressing) will be applied over each injection site. The procedure may vary among health care providers and hospitals. What happens after the procedure?  You will be observed for 15-30 minutes before being allowed to go home. This information is not intended to replace advice given to you by your health care provider. Make sure you discuss any questions you have with your health care provider. Document Released: 11/24/2006 Document Revised: 08/06/2015 Document Reviewed: 03/31/2015 Elsevier Interactive Patient Education  2017 [employer] GENERAL RISKS AND COMPLICATIONS  What are the risk, side effects and possible complications? Generally speaking, most procedures are  safe.  However, with any procedure there are risks, side effects, and the possibility of complications.  The risks and complications are dependent upon the sites that are lesioned, or the type of nerve block to be performed.  The closer the procedure is to the spine, the more serious the risks are.  Great care is taken when placing the radio frequency needles, block needles or lesioning  probes, but sometimes complications can occur. 1. Infection: Any time there is an injection through the skin, there is a risk of infection.  This is why sterile conditions are used for these blocks.  There are four possible types of infection. 1. Localized skin infection. 2. Central Nervous System Infection-This can be in the form of Meningitis, which can be deadly. 3. Epidural Infections-This can be in the form of an epidural abscess, which can cause pressure inside of the spine, causing compression of the spinal cord with subsequent paralysis. This would require an emergency surgery to decompress, and there are no guarantees that the patient would recover from the paralysis. 4. Discitis-This is an infection of the intervertebral discs.  It occurs in about 1% of discography procedures.  It is difficult to treat and it may lead to surgery.        2. Pain: the needles have to go through skin and soft tissues, will cause soreness.       3. Damage to internal structures:  The nerves to be lesioned may be near blood vessels or    other nerves which can be potentially damaged.       4. Bleeding: Bleeding is more common if the patient is taking blood thinners such as  aspirin, Coumadin, Ticiid, Plavix, etc., or if he/she have some genetic predisposition  such as hemophilia. Bleeding into the spinal canal can cause compression of the spinal  cord with subsequent paralysis.  This would require an emergency surgery to  decompress and there are no guarantees that the patient would recover from the  paralysis.       5. Pneumothorax:   Puncturing of a lung is a possibility, every time a needle is introduced in  the area of the chest or upper back.  Pneumothorax refers to free air around the  collapsed lung(s), inside of the thoracic cavity (chest cavity).  Another two possible  complications related to a similar event would include: Hemothorax and Chylothorax.   These are variations of the Pneumothorax, where instead of air around the collapsed  lung(s), you may have blood or chyle, respectively.       6. Spinal headaches: They may occur with any procedures in the area of the spine.       7. Persistent CSF (Cerebro-Spinal Fluid) leakage: This is a rare problem, but may occur  with prolonged intrathecal or epidural catheters either due to the formation of a fistulous  track or a dural tear.       8. Nerve damage: By working so close to the spinal cord, there is always a possibility of  nerve damage, which could be as serious as a permanent spinal cord injury with  paralysis.       9. Death:  Although rare, severe deadly allergic reactions known as "Anaphylactic  reaction" can occur to any of the medications used.      10. Worsening of the symptoms:  We can always make thing worse.  What are the chances of something like this happening? Chances of any of this occuring are extremely low.  By statistics, you have more of a chance of getting killed in a motor vehicle accident: while driving to the hospital than any of the above occurring .  Nevertheless, you should be aware that they are possibilities.  In general, it is similar to taking a shower.  Everybody knows that you can slip, hit your head and get killed.  Does that mean that you  should not shower again?  Nevertheless always keep in mind that statistics do not mean anything if you happen to be on the wrong side of them.  Even if a procedure has a 1 (one) in a 1,000,000 (million) chance of going wrong, it you happen to be that one..Also, keep in mind that by statistics, you have more of  a chance of having something go wrong when taking medications.  Who should not have this procedure? If you are on a blood thinning medication (e.g. Coumadin, Plavix, see list of "Blood Thinners"), or if you have an active infection going on, you should not have the procedure.  If you are taking any blood thinners, please inform your physician.  How should I prepare for this procedure?  Do not eat or drink anything at least six hours prior to the procedure.  Bring a driver with you .  It cannot be a taxi.  Come accompanied by an adult that can drive you back, and that is strong enough to help you if your legs get weak or numb from the local anesthetic.  Take all of your medicines the morning of the procedure with just enough water to swallow them.  If you have diabetes, make sure that you are scheduled to have your procedure done first thing in the morning, whenever possible.  If you have diabetes, take only half of your insulin dose and notify our nurse that you have done so as soon as you arrive at the clinic.  If you are diabetic, but only take blood sugar pills (oral hypoglycemic), then do not take them on the morning of your procedure.  You may take them after you have had the procedure.  Do not take aspirin or any aspirin-containing medications, at least eleven (11) days prior to the procedure.  They may prolong bleeding.  Wear loose fitting clothing that may be easy to take off and that you would not mind if it got stained with Betadine or blood.  Do not wear any jewelry or perfume  Remove any nail coloring.  It will interfere with some of our monitoring equipment.  NOTE: Remember that this is not meant to be interpreted as a complete list of all possible complications.  Unforeseen problems may occur.  BLOOD THINNERS The following drugs contain aspirin or other products, which can cause increased bleeding during surgery and should not be taken for 2 weeks prior to and 1 week  after surgery.  If you should need take something for relief of minor pain, you may take acetaminophen which is found in Tylenol,m Datril, Anacin-3 and Panadol. It is not blood thinner. The products listed below are.  Do not take any of the products listed below in addition to any listed on your instruction sheet.  A.P.C or A.P.C with Codeine Codeine Phosphate Capsules #3 Ibuprofen Ridaura  ABC compound Congesprin Imuran rimadil  Advil Cope Indocin Robaxisal  Alka-Seltzer Effervescent Pain Reliever and Antacid Coricidin or Coricidin-D  Indomethacin Rufen  Alka-Seltzer plus Cold Medicine Cosprin Ketoprofen S-A-C Tablets  Anacin Analgesic Tablets or Capsules Coumadin Korlgesic Salflex  Anacin Extra Strength Analgesic tablets or capsules CP-2 Tablets Lanoril Salicylate  Anaprox Cuprimine Capsules Levenox Salocol  Anexsia-D Dalteparin Magan Salsalate  Anodynos Darvon compound Magnesium Salicylate Sine-off  Ansaid Dasin Capsules Magsal Sodium Salicylate  Anturane Depen Capsules Marnal Soma  APF Arthritis pain formula Dewitt's Pills Measurin Stanback  Argesic Dia-Gesic Meclofenamic Sulfinpyrazone  Arthritis Bayer Timed Release Aspirin Diclofenac Meclomen Sulindac  Arthritis pain  formula Anacin Dicumarol Medipren Supac  Analgesic (Safety coated) Arthralgen Diffunasal Mefanamic Suprofen  Arthritis Strength Bufferin Dihydrocodeine Mepro Compound Suprol  Arthropan liquid Dopirydamole Methcarbomol with Aspirin Synalgos  ASA tablets/Enseals Disalcid Micrainin Tagament  Ascriptin Doan's Midol Talwin  Ascriptin A/D Dolene Mobidin Tanderil  Ascriptin Extra Strength Dolobid Moblgesic Ticlid  Ascriptin with Codeine Doloprin or Doloprin with Codeine Momentum Tolectin  Asperbuf Duoprin Mono-gesic Trendar  Aspergum Duradyne Motrin or Motrin IB Triminicin  Aspirin plain, buffered or enteric coated Durasal Myochrisine Trigesic  Aspirin Suppositories Easprin Nalfon Trillsate  Aspirin with Codeine Ecotrin  Regular or Extra Strength Naprosyn Uracel  Atromid-S Efficin Naproxen Ursinus  Auranofin Capsules Elmiron Neocylate Vanquish  Axotal Emagrin Norgesic Verin  Azathioprine Empirin or Empirin with Codeine Normiflo Vitamin E  Azolid Emprazil Nuprin Voltaren  Bayer Aspirin plain, buffered or children's or timed BC Tablets or powders Encaprin Orgaran Warfarin Sodium  Buff-a-Comp Enoxaparin Orudis Zorpin  Buff-a-Comp with Codeine Equegesic Os-Cal-Gesic   Buffaprin Excedrin plain, buffered or Extra Strength Oxalid   Bufferin Arthritis Strength Feldene Oxphenbutazone   Bufferin plain or Extra Strength Feldene Capsules Oxycodone with Aspirin   Bufferin with Codeine Fenoprofen Fenoprofen Pabalate or Pabalate-SF   Buffets II Flogesic Panagesic   Buffinol plain or Extra Strength Florinal or Florinal with Codeine Panwarfarin   Buf-Tabs Flurbiprofen Penicillamine   Butalbital Compound Four-way cold tablets Penicillin   Butazolidin Fragmin Pepto-Bismol   Carbenicillin Geminisyn Percodan   Carna Arthritis Reliever Geopen Persantine   Carprofen Gold's salt Persistin   Chloramphenicol Goody's Phenylbutazone   Chloromycetin Haltrain Piroxlcam   Clmetidine heparin Plaquenil   Cllnoril Hyco-pap Ponstel   Clofibrate Hydroxy chloroquine Propoxyphen         Before stopping any of these medications, be sure to consult the physician who ordered them.  Some, such as Coumadin (Warfarin) are ordered to prevent or treat serious conditions such as "deep thrombosis", "pumonary embolisms", and other heart problems.  The amount of time that you may need off of the medication may also vary with the medication and the reason for which you were taking it.  If you are taking any of these medications, please make sure you notify your pain physician before you undergo any procedures.

## 2016-12-16 ENCOUNTER — Telehealth: Payer: Self-pay

## 2016-12-16 NOTE — Telephone Encounter (Signed)
Post procedure phone call.  Did not leave message due to no name being on answering machine.

## 2016-12-21 ENCOUNTER — Telehealth: Payer: Self-pay | Admitting: Pain Medicine

## 2016-12-21 NOTE — Telephone Encounter (Signed)
Arvil ChacoDonna Langland brought in a request for Prior Auth on Oxycodone. Pharmacy told her they had called and faxed over several times.

## 2016-12-22 NOTE — Progress Notes (Signed)
Results were reviewed and found to be: abnormal  Further testing may be useful  Review would suggest interventional pain management techniques may be of benefit

## 2016-12-22 NOTE — Telephone Encounter (Signed)
We received the PA request on 12-20-16. Has not been sent. Attempted to call patient, the phone number given was not valid.

## 2016-12-22 NOTE — Progress Notes (Signed)
Cardiology Office Note  Date:  12/23/2016   ID:  Royetta AsalDonna A Scotto, DOB 1966/04/03, MRN [id number]  PCP:  Hillery AldoPatel, Sarah, MD   Chief Complaint  Patient presents with  . other    3 month follow up. Meds reviewed by the pt. verbally. Pt. c/o shortness of breath, rapid heart beats and chest pain that comes and goes.     HPI:   51 y.o.femalewith h/o  smoking, COPD, on inhalers, quit 04/2016 medication noncompliance secondary to financial and insurance issues,  chronic thrush,  chronic back pain   hospital  November 2016 with shortness of breath, pneumonia,  echocardiogram showing normal LV function, EF 60%,  elevated right heart pressures,   sodium around 112, abnormal troponin, long hospital course CT 06/2015, No significant CAD, PAD, minimal in aortic arch She presents today for follow-up of her chronic diastolic CHF  In follow-up today she reports that she is doing well May have broken left foot small toe, hit a recliner with her foot by accident  Leg swelling has essentially resolved Taking lasix 40 daily Sometimes extra lasix 20 in the PM She's moderating her fluid intake  Lab work reviewed from primary care showing  Stable normal renal function, sodium 140s, potassium 4.1  Reports that she quit smoking October 2017 Occasionally breaks down has cigarette  EKG personally reviewed by myself on todays visit Shows normal sinus rhythm with rate 90 bpm no significant ST or T-wave changes  Other past medical history reviewed weight gain, worsening shortness of breath Seen in the ER 06/14/16 with shortness of breath symptoms, given IV Lasix  Previously had several Prednisone tapers, most recently in jan 2018,  OSA, does not use CPAP. Had problems with anxiety  Continues to take Advair, spiriva and proair Problems with thrush, takes Diflucan 150 mg 1 as needed   CT scan chest  Oct 2017  Previous CT 06/2015 No significant CAD, PAD, minimal in aortic arch Images reviewed  with her in detail   Recent EKG reviewed from 08/31/2016 when she saw CHF clinic showing normal sinus rhythm rate 66 bpm no significant ST or T-wave changes  PMH:   has a past medical history of Arthritis; Asthma; CHF (congestive heart failure) (HCC); COPD (chronic obstructive pulmonary disease) (HCC); Gastric ulcer; Hyperlipidemia; Hypertension; and OSA on CPAP (no ins - no cpap machine for the last 4 years).  PSH:    Past Surgical History:  Procedure Laterality Date  . FLEXIBLE BRONCHOSCOPY N/A 06/20/2015   Procedure: FLEXIBLE BRONCHOSCOPY;  Surgeon: Shane CrutchPradeep Ramachandran, MD;  Location: [facility] ORS;  Service: Pulmonary;  Laterality: N/A;  . KNEE SURGERY Left    8 knee surgeries    Current Outpatient Prescriptions  Medication Sig Dispense Refill  . albuterol (PROAIR HFA) 108 (90 Base) MCG/ACT inhaler Inhale into the lungs 4 (four) times daily.    Marland Kitchen. aspirin EC 81 MG EC tablet Take 1 tablet (81 mg total) by mouth daily. 30 tablet 0  . atorvastatin (LIPITOR) 40 MG tablet Take 40 mg by mouth daily.    . Cholecalciferol (VITAMIN D3) 2000 units capsule Take 1 capsule (2,000 Units total) by mouth daily. 30 capsule PRN  . diltiazem (CARDIZEM) 30 MG tablet Take 1 tablet (30 mg total) by mouth 3 (three) times daily as needed. 90 tablet 3  . DULoxetine (CYMBALTA) 60 MG capsule Take 60 mg by mouth daily.    . Fluticasone-Salmeterol (ADVAIR DISKUS IN) Inhale into the lungs 2 (two) times daily.    .Marland Kitchen  folic acid (FOLVITE) 1 MG tablet Take 1 mg by mouth daily.    . furosemide (LASIX) 40 MG tablet Take 40 mg by mouth 2 (two) times daily. Take an additional 40 mg in the am for weight gain as needed    . HYDROcodone-acetaminophen (NORCO/VICODIN) 5-325 MG tablet Take 1 tablet by mouth every 12 (twelve) hours as needed for moderate pain. 60 tablet 0  . HYDROcodone-acetaminophen (NORCO/VICODIN) 5-325 MG tablet Take 1 tablet by mouth every 12 (twelve) hours as needed for moderate pain. 60 tablet 0  . [START ON  01/09/2017] HYDROcodone-acetaminophen (NORCO/VICODIN) 5-325 MG tablet Take 1 tablet by mouth every 12 (twelve) hours as needed for moderate pain. 60 tablet 0  . oxyCODONE-acetaminophen (PERCOCET) 5-325 MG tablet Take 1 tablet by mouth every 8 (eight) hours as needed for severe pain. 30 tablet 0  . OXYGEN Inhale into the lungs. 2 liters at bedtime    . pantoprazole (PROTONIX) 40 MG tablet Take 1 tablet (40 mg total) by mouth daily. 30 tablet 0  . potassium chloride SA (K-DUR,KLOR-CON) 20 MEQ tablet Take 20 mEq by mouth 2 (two) times daily.    . pregabalin (LYRICA) 75 MG capsule Take 1 capsule (75 mg total) by mouth 3 (three) times daily. 270 capsule 0  . sucralfate (CARAFATE) 1 G tablet Take 1 tablet (1 g total) by mouth 4 (four) times daily -  with meals and at bedtime. 120 tablet 0  . tiotropium (SPIRIVA HANDIHALER) 18 MCG inhalation capsule Place 18 mcg into inhaler and inhale daily.    Marland Kitchen tiZANidine (ZANAFLEX) 4 MG capsule Take 1 capsule (4 mg total) by mouth 3 (three) times daily as needed for muscle spasms. 90 capsule 2   No current facility-administered medications for this visit.      Allergies:   Tramadol and Penicillins   Social History:  The patient  reports that she quit smoking about 18 months ago. Her smoking use included Cigarettes. She has a 36.00 pack-year smoking history. She has never used smokeless tobacco. She reports that she drinks alcohol. She reports that she does not use drugs.   Family History:   family history includes Breast cancer in her mother; Diabetes in her maternal grandmother; Lung cancer in her father.    Review of Systems: Review of Systems  Constitutional: Negative.        Weight gain  HENT:       Thrush  Respiratory: Negative.   Cardiovascular: Negative.   Gastrointestinal: Negative.        Abdominal distention  Musculoskeletal: Negative.        Toe pain  Neurological: Negative.   Psychiatric/Behavioral: Negative.   All other systems reviewed  and are negative.    PHYSICAL EXAM: VS:  BP 140/80 (BP Location: Left Arm, Patient Position: Sitting, Cuff Size: Normal)   Pulse 90   Ht 5\' 8"  (1.727 m)   Wt 204 lb 12 oz (92.9 kg)   LMP 03/13/2015 (Approximate) Comment: states she is going through menopause  BMI 31.13 kg/m  , BMI Body mass index is 31.13 kg/m.  GEN: Well nourished, well developed, in no acute distress, obese  HEENT: normal  Neck: no JVD, carotid bruits, or masses Cardiac: RRR; no murmurs, rubs, or gallops,no edema  Respiratory:  Mildly decreased breath sounds throughout, normal work of breathing GI: soft, nontender, nondistended, + BS MS: no deformity or atrophy , Significant bruising left foot last 3 toes  Skin: warm and dry, no rash Neuro:  Strength and sensation are intact Psych: euthymic mood, full affect    Recent Labs: 04/30/2016: Magnesium 1.9 06/14/2016: ALT 26; B Natriuretic Peptide 42.0; BUN 14; Creatinine, Ser 0.95; Hemoglobin 13.9; Platelets 249; Potassium 3.4; Sodium 136    Lipid Panel Lab Results  Component Value Date   CHOL 177 06/14/2015   HDL 25 (L) 06/14/2015   LDLCALC 113 (H) 06/14/2015   TRIG 193 (H) 06/14/2015      Wt Readings from Last 3 Encounters:  12/23/16 204 lb 12 oz (92.9 kg)  12/15/16 200 lb (90.7 kg)  12/07/16 206 lb 6 oz (93.6 kg)       ASSESSMENT AND PLAN:   Chronic diastolic heart failure (HCC) Weight relatively stable Taking Lasix 40 mg daily, extra Lasix after lunch 20-40 for any ankle swelling Appears euvolemic today  Smoker Reports that she stopped smoking October 2017  Palpitations Denies having significant palpitations No further workup at this time  Simple chronic bronchitis (HCC)Prior history ofc bronchitis  Pulmonary HTN Prior echocardiogram November 2016 with moderately elevated right heart pressures Appears relatively euvolemic, breathing improved  Shortness of breath Much improved on today's visit  previously felt to be  multifactorial :  severe COPD, obesity, deconditioning, diastolic CHF/pulmonary hypertension  COPD (chronic obstructive pulmonary disease) with acute bronchitis (HCC) Currently uses  steroid inhalers, albuterol Diflucan prescribed for thrush  Obstructive sleep apnea Unable to tolerate CPAP, was anxious  Morbid obesity (HCC) We have encouraged careful diet management in an effort to lose weight.   Total encounter time more than 45 minutes  Greater than 50% was spent in counseling and coordination of care with the patient   Disposition:   F/U  6 months  No orders of the defined types were placed in this encounter.    Signed, Dossie Arbour, M.D., Ph.D. 12/23/2016  [facility] [place], [place] [phone]

## 2016-12-23 ENCOUNTER — Encounter: Payer: Self-pay | Admitting: Cardiovascular Disease

## 2016-12-23 ENCOUNTER — Ambulatory Visit (INDEPENDENT_AMBULATORY_CARE_PROVIDER_SITE_OTHER): Payer: Medicaid Other | Admitting: Cardiovascular Disease

## 2016-12-23 VITALS — BP 140/80 | HR 90 | Ht 68.0 in | Wt 204.8 lb

## 2016-12-23 DIAGNOSIS — J440 Chronic obstructive pulmonary disease with acute lower respiratory infection: Secondary | ICD-10-CM | POA: Diagnosis not present

## 2016-12-23 DIAGNOSIS — I5032 Chronic diastolic (congestive) heart failure: Secondary | ICD-10-CM

## 2016-12-23 DIAGNOSIS — J209 Acute bronchitis, unspecified: Secondary | ICD-10-CM | POA: Diagnosis not present

## 2016-12-23 DIAGNOSIS — R0602 Shortness of breath: Secondary | ICD-10-CM

## 2016-12-23 DIAGNOSIS — F17200 Nicotine dependence, unspecified, uncomplicated: Secondary | ICD-10-CM

## 2016-12-23 MED ORDER — DILTIAZEM HCL ER COATED BEADS 120 MG PO CP24: 120.0000 mg | ORAL_CAPSULE | Freq: Every day | ORAL | 11 refills | Status: DC

## 2016-12-23 NOTE — Telephone Encounter (Signed)
PA was sent in today, 12-23-16.

## 2016-12-23 NOTE — Patient Instructions (Addendum)
Medication Instructions:   PLease stop the diltiazem 30 pills Start the diltiazem 120 one a day Monitor blood pressure  Labwork:  No new labs needed  Testing/Procedures:  No further testing at this time   I recommend watching educational videos on topics of interest to you at:       [email or web address]  Enter code: HEARTCARE    Follow-Up: It was a pleasure seeing you in the office today. Please call us if you have new issues that need to be addressed before your next appt.  [phone]  Your physician wants you to follow-up in: 6 months.  You will receive a reminder letter in the mail two months in advance. If you don't receive a letter, please call our office to schedule the follow-up appointment.  If you need a refill on your cardiac medications before your next appointment, please call your pharmacy.

## 2017-01-06 IMAGING — CR DG CHEST 2V
2 series · 2 of 2 positions shown · non-contrast
Comparison: April 01, 2016

CLINICAL DATA: Chest pain and cough for 1 week.

EXAM:
CHEST  2 VIEW

[chest pa]
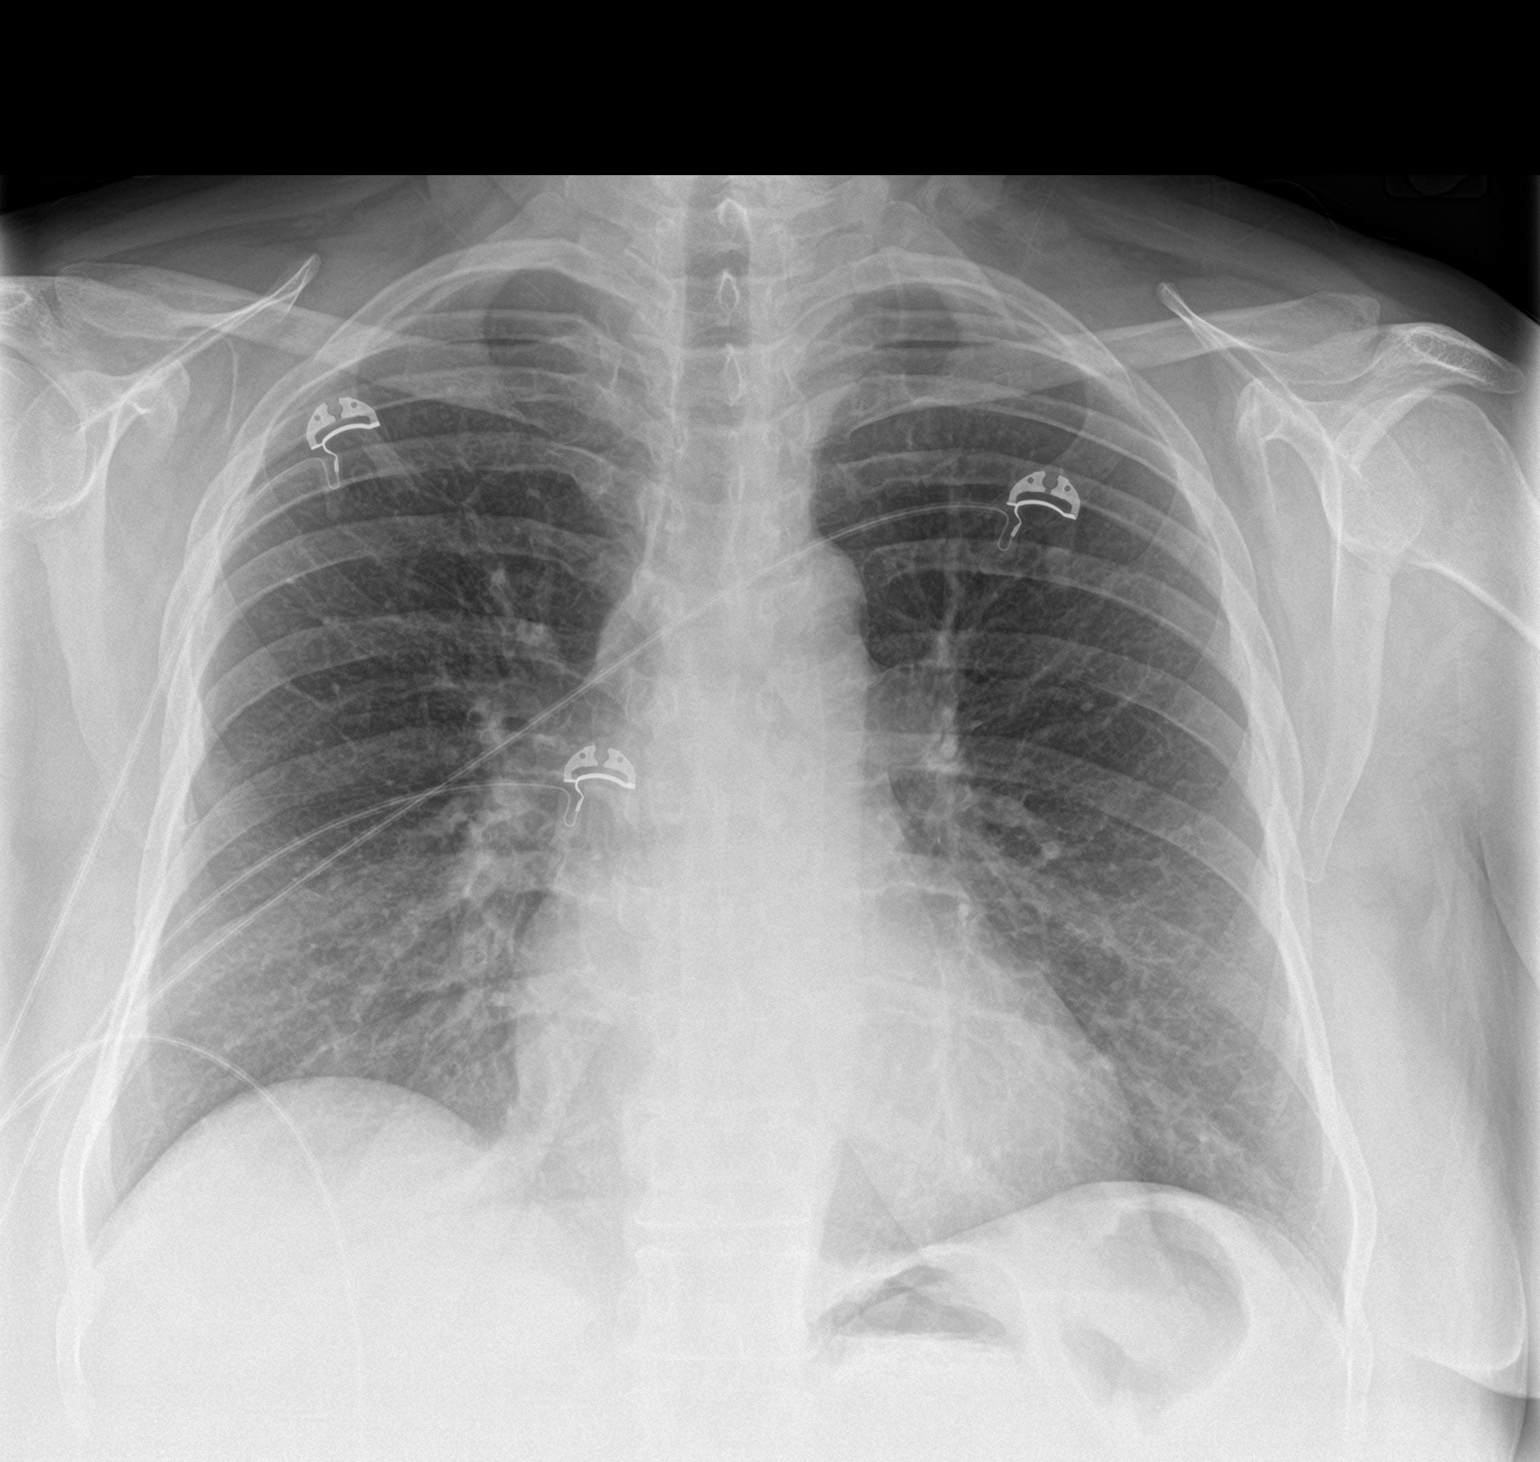

[chest lat]
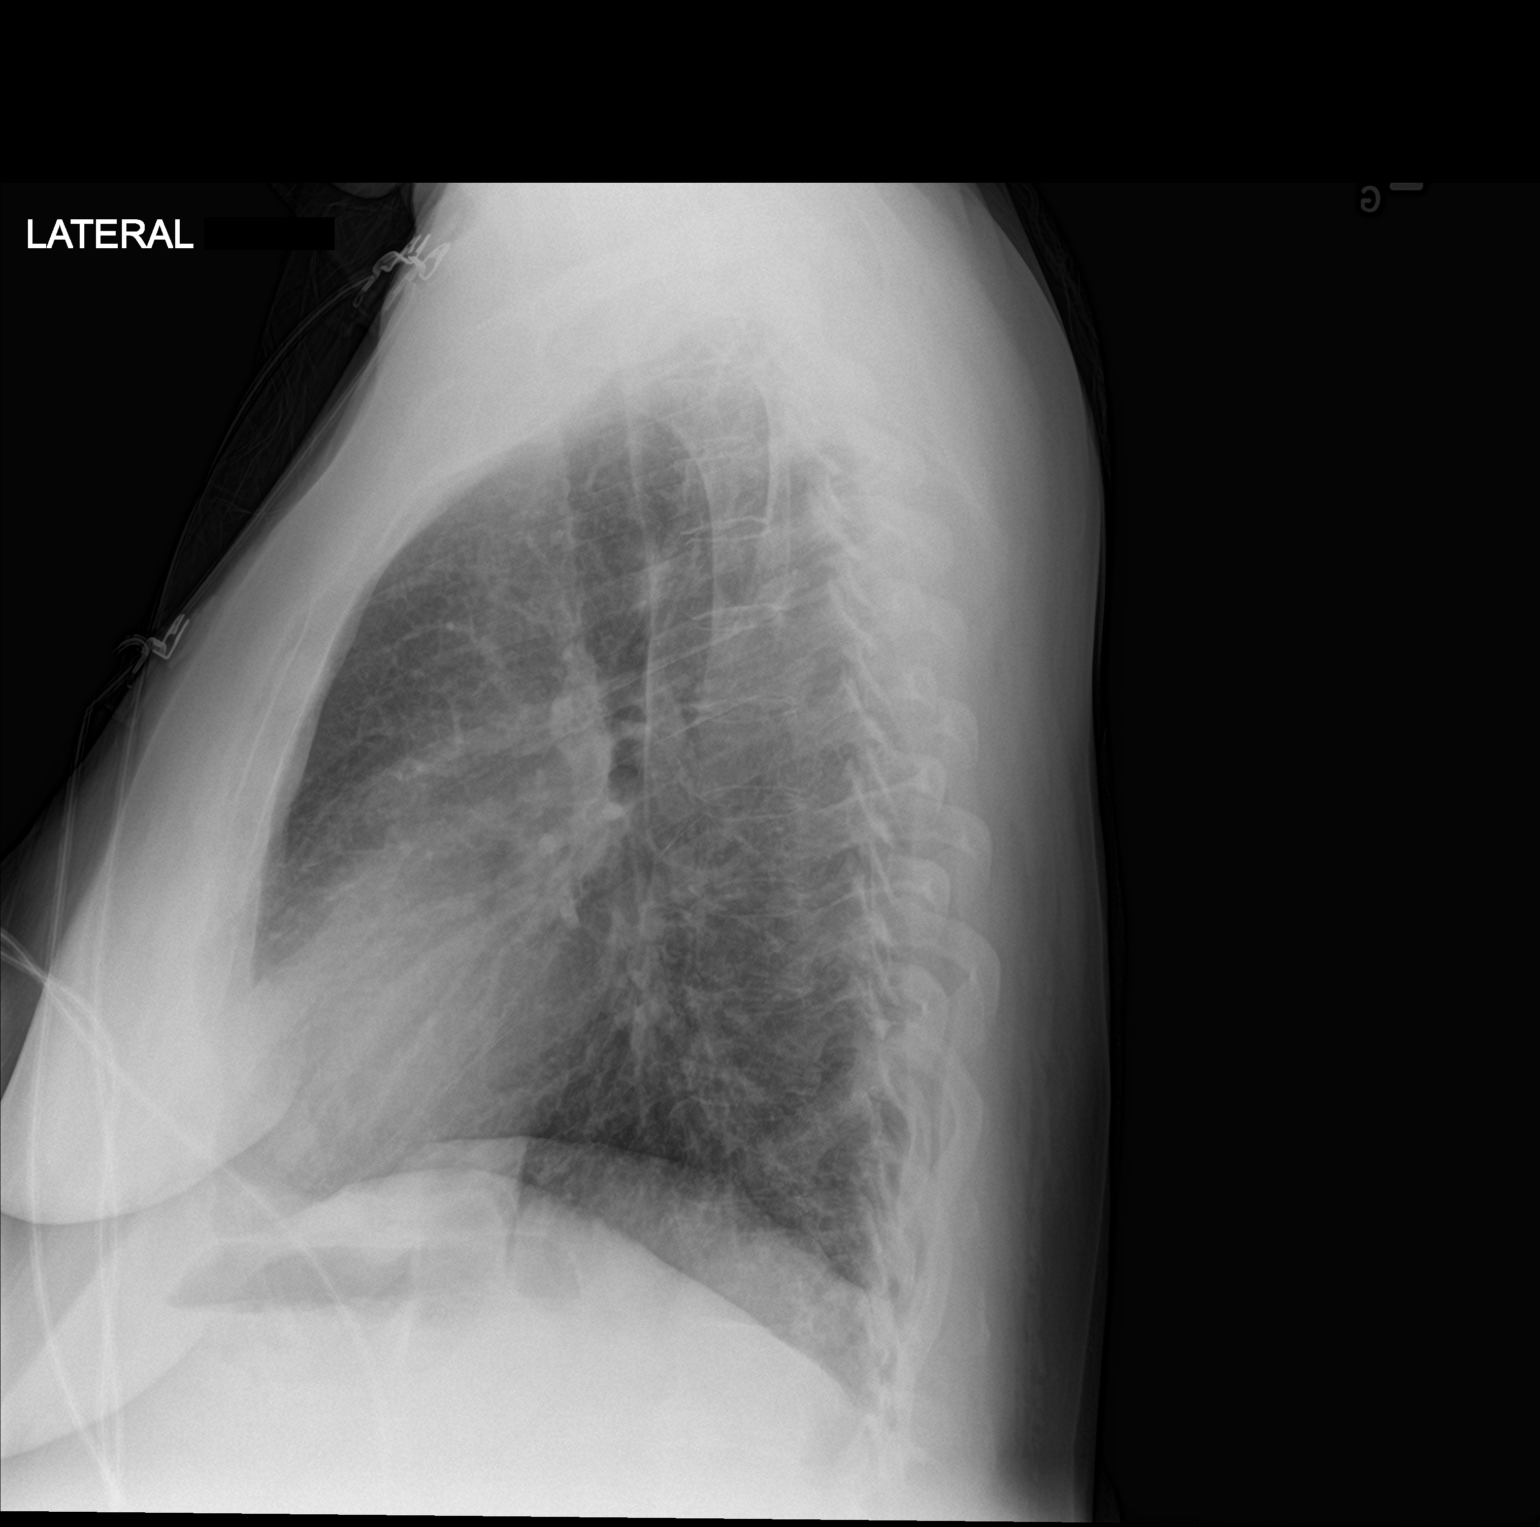

[2 of 2 positions shown; findings below may reference images not displayed]

FINDINGS: The heart size borderline. The hila and mediastinum are normal. No
pneumothorax. No pulmonary nodules, masses, or focal infiltrates.
IMPRESSION: No active cardiopulmonary disease.

## 2017-01-14 ENCOUNTER — Telehealth: Payer: Self-pay | Admitting: Pain Medicine

## 2017-01-14 NOTE — Telephone Encounter (Signed)
Needs prior authorization done for 3 meds with medicaid. Please call patient when this is ready so she can pick up. She has been out a few days now

## 2017-01-14 NOTE — Telephone Encounter (Signed)
Attempted to call patient.  The phone number is not correct.  Unable to locate any other numbers.  If she calls back, please get a correct number.  The number given is for [employer] payment center.  Trying to determine if patient has tried any other muscle relaxers so that we may complete PA for the Tizanidine.  The PA for the PErcocet has been done on 12-23-16.

## 2017-01-24 ENCOUNTER — Telehealth: Payer: Self-pay | Admitting: Pain Medicine

## 2017-01-24 ENCOUNTER — Ambulatory Visit: Payer: Medicaid Other | Admitting: Pain Medicine

## 2017-01-24 NOTE — Telephone Encounter (Signed)
Tried to call patient at new number X 2. Did not ring, went straight to voicemail and said " voicemailbox has not been set uip", therefore there is no way to contact patient at this number.

## 2017-01-24 NOTE — Telephone Encounter (Signed)
New Phone Number for patient [phone] please call about medications. She is still unable to get one of them. Per Vmail left by patient at 9:56 am on 01-24-17

## 2017-01-26 ENCOUNTER — Telehealth: Payer: Self-pay

## 2017-01-26 NOTE — Telephone Encounter (Signed)
OK per Dr. Laban EmperorNaveira to change from Zanaflex from capsule form to tablet form so that medicaid will pay for it. Called [facility] and let them know.

## 2017-01-26 NOTE — Telephone Encounter (Signed)
Pt has been trying to get the capsul form instead of tablet of the medication tizanidine. Please call pt because she is saying that she is getting the run around

## 2017-02-09 ENCOUNTER — Encounter: Payer: Medicaid Other | Admitting: Nurse Practitioner

## 2017-02-21 ENCOUNTER — Encounter: Payer: Self-pay | Admitting: Nurse Practitioner

## 2017-02-21 ENCOUNTER — Telehealth: Payer: Self-pay | Admitting: *Deleted

## 2017-02-21 ENCOUNTER — Ambulatory Visit: Payer: Medicaid Other | Attending: Nurse Practitioner | Admitting: Nurse Practitioner

## 2017-02-21 VITALS — BP 133/77 | HR 101 | Temp 98.4°F | Resp 16 | Ht 68.0 in | Wt 202.0 lb

## 2017-02-21 DIAGNOSIS — G4733 Obstructive sleep apnea (adult) (pediatric): Secondary | ICD-10-CM | POA: Insufficient documentation

## 2017-02-21 DIAGNOSIS — M1990 Unspecified osteoarthritis, unspecified site: Secondary | ICD-10-CM | POA: Insufficient documentation

## 2017-02-21 DIAGNOSIS — Z880 Allergy status to penicillin: Secondary | ICD-10-CM | POA: Diagnosis not present

## 2017-02-21 DIAGNOSIS — G609 Hereditary and idiopathic neuropathy, unspecified: Secondary | ICD-10-CM

## 2017-02-21 DIAGNOSIS — Z9889 Other specified postprocedural states: Secondary | ICD-10-CM | POA: Insufficient documentation

## 2017-02-21 DIAGNOSIS — M792 Neuralgia and neuritis, unspecified: Secondary | ICD-10-CM

## 2017-02-21 DIAGNOSIS — Z79891 Long term (current) use of opiate analgesic: Secondary | ICD-10-CM | POA: Insufficient documentation

## 2017-02-21 DIAGNOSIS — G8929 Other chronic pain: Secondary | ICD-10-CM | POA: Diagnosis not present

## 2017-02-21 DIAGNOSIS — K259 Gastric ulcer, unspecified as acute or chronic, without hemorrhage or perforation: Secondary | ICD-10-CM | POA: Insufficient documentation

## 2017-02-21 DIAGNOSIS — I110 Hypertensive heart disease with heart failure: Secondary | ICD-10-CM | POA: Diagnosis not present

## 2017-02-21 DIAGNOSIS — E785 Hyperlipidemia, unspecified: Secondary | ICD-10-CM | POA: Diagnosis not present

## 2017-02-21 DIAGNOSIS — Z833 Family history of diabetes mellitus: Secondary | ICD-10-CM | POA: Insufficient documentation

## 2017-02-21 DIAGNOSIS — Z885 Allergy status to narcotic agent status: Secondary | ICD-10-CM | POA: Diagnosis not present

## 2017-02-21 DIAGNOSIS — R252 Cramp and spasm: Secondary | ICD-10-CM

## 2017-02-21 DIAGNOSIS — M62830 Muscle spasm of back: Secondary | ICD-10-CM | POA: Insufficient documentation

## 2017-02-21 DIAGNOSIS — Z9981 Dependence on supplemental oxygen: Secondary | ICD-10-CM | POA: Insufficient documentation

## 2017-02-21 DIAGNOSIS — M4696 Unspecified inflammatory spondylopathy, lumbar region: Secondary | ICD-10-CM | POA: Diagnosis not present

## 2017-02-21 DIAGNOSIS — Z803 Family history of malignant neoplasm of breast: Secondary | ICD-10-CM | POA: Insufficient documentation

## 2017-02-21 DIAGNOSIS — Z801 Family history of malignant neoplasm of trachea, bronchus and lung: Secondary | ICD-10-CM | POA: Diagnosis not present

## 2017-02-21 DIAGNOSIS — M1732 Unilateral post-traumatic osteoarthritis, left knee: Secondary | ICD-10-CM | POA: Insufficient documentation

## 2017-02-21 DIAGNOSIS — Z7982 Long term (current) use of aspirin: Secondary | ICD-10-CM | POA: Insufficient documentation

## 2017-02-21 DIAGNOSIS — M47816 Spondylosis without myelopathy or radiculopathy, lumbar region: Secondary | ICD-10-CM

## 2017-02-21 DIAGNOSIS — J449 Chronic obstructive pulmonary disease, unspecified: Secondary | ICD-10-CM | POA: Diagnosis not present

## 2017-02-21 DIAGNOSIS — I5032 Chronic diastolic (congestive) heart failure: Secondary | ICD-10-CM | POA: Insufficient documentation

## 2017-02-21 DIAGNOSIS — G894 Chronic pain syndrome: Secondary | ICD-10-CM | POA: Diagnosis not present

## 2017-02-21 DIAGNOSIS — M25552 Pain in left hip: Secondary | ICD-10-CM | POA: Diagnosis not present

## 2017-02-21 DIAGNOSIS — Z87891 Personal history of nicotine dependence: Secondary | ICD-10-CM | POA: Insufficient documentation

## 2017-02-21 MED ORDER — TIZANIDINE HCL 4 MG PO CAPS: 4.0000 mg | ORAL_CAPSULE | Freq: Three times a day (TID) | ORAL | 2 refills | Status: DC | PRN

## 2017-02-21 MED ORDER — PREGABALIN 75 MG PO CAPS: 75.0000 mg | ORAL_CAPSULE | Freq: Three times a day (TID) | ORAL | 0 refills | Status: DC

## 2017-02-21 MED ORDER — HYDROCODONE-ACETAMINOPHEN 5-325 MG PO TABS: 1.0000 | ORAL_TABLET | Freq: Two times a day (BID) | ORAL | 0 refills | Status: DC | PRN

## 2017-02-21 NOTE — Progress Notes (Signed)
Nursing Pain Medication Assessment:  Safety precautions to be maintained throughout the outpatient stay will include: orient to surroundings, keep bed in low position, maintain call bell within reach at all times, provide assistance with transfer out of bed and ambulation.  Medication Inspection Compliance: Pill count conducted under aseptic conditions, in front of the patient. Neither the pills nor the bottle was removed from the patient's sight at any time. Once count was completed pills were immediately returned to the patient in their original bottle.  Medication: Hydrocodone/APAP Pill/Patch Count: 0 of 60 pills remain Pill/Patch Appearance: Markings consistent with prescribed medication Bottle Appearance: Standard pharmacy container. Clearly labeled. Filled Date: 06 / 29 / 2018 Last Medication intake:  Day before yesterday

## 2017-02-21 NOTE — Patient Instructions (Addendum)
med  ____________________________________________________________________________________________  Medication Rules  Applies to: All patients receiving prescriptions (written or electronic).  Pharmacy of record: Pharmacy where electronic prescriptions will be sent. If written prescriptions are taken to a different pharmacy, please inform the nursing staff. The pharmacy listed in the electronic medical record should be the one where you would like electronic prescriptions to be sent.  Prescription refills: Only during scheduled appointments. Applies to both, written and electronic prescriptions.  NOTE: The following applies primarily to controlled substances (Opioid* Pain Medications).   Patient's responsibilities: 1. Pain Pills: Bring all pain pills to every appointment (except for procedure appointments). 2. Pill Bottles: Bring pills in original pharmacy bottle. Always bring newest bottle. Bring bottle, even if empty. 3. Medication refills: You are responsible for knowing and keeping track of what medications you need refilled. The day before your appointment, write a list of all prescriptions that need to be refilled. Bring that list to your appointment and give it to the admitting nurse. Prescriptions will be written only during appointments. If you forget a medication, it will not be "Called in", "Faxed", or "electronically sent". You will need to get another appointment to get these prescribed. 4. Prescription Accuracy: You are responsible for carefully inspecting your prescriptions before leaving our office. Have the discharge nurse carefully go over each prescription with you, before taking them home. Make sure that your name is accurately spelled, that your address is correct. Check the name and dose of your medication to make sure it is accurate. Check the number of pills, and the written instructions to make sure they are clear and accurate. Make sure that you are given enough medication  to last until your next medication refill appointment. 5. Taking Medication: Take medication as prescribed. Never take more pills than instructed. Never take medication more frequently than prescribed. Taking less pills or less frequently is permitted and encouraged, when it comes to controlled substances (written prescriptions).  6. Inform other Doctors: Always inform, all of your healthcare providers, of all the medications you take. 7. Pain Medication from other Providers: You are not allowed to accept any additional pain medication from any other Doctor or Healthcare provider. There are two exceptions to this rule. (see below) In the event that you require additional pain medication, you are responsible for notifying us, as stated below. 8. Medication Agreement: You are responsible for carefully reading and following our Medication Agreement. This must be signed before receiving any prescriptions from our practice. Safely store a copy of your signed Agreement. Violations to the Agreement will result in no further prescriptions. (Additional copies of our Medication Agreement are available upon request.) 9. Laws, Rules, & Regulations: All patients are expected to follow all [address] and [place] DisneyState Laws, [employer] Industries[employer]utes, Rules, [place] [employer] [place] [employer] Regulations. Ignorance of the Laws does not constitute a valid excuse. The use of any illegal substances is prohibited. 10. Adopted CDC guidelines & recommendations: Target dosing levels will be at or below 60 MME/day. Use of benzodiazepines** is not recommended.  Exceptions: There are only two exceptions to the rule of not receiving pain medications from other Healthcare Providers. 1. Exception #1 (Emergencies): In the event of an emergency (i.e.: accident requiring emergency care), you are allowed to receive additional pain medication. However, you are responsible for: As soon as you are able, call our office [phone], at any time of the day or night, and leave a message stating your  name, the date and nature of the emergency, and the name and dose of  the medication prescribed. In the event that your call is answered by a member of our staff, make sure to document and save the date, time, and the name of the person that took your information.  2. Exception #2 (Planned Surgery): In the event that you are scheduled by another doctor or dentist to have any type of surgery or procedure, you are allowed (for a period no longer than 30 days), to receive additional pain medication, for the acute post-op pain. However, in this case, you are responsible for picking up a copy of our "Post-op Pain Management for Surgeons" handout, and giving it to your surgeon or dentist. This document is available at our office, and does not require an appointment to obtain it. Simply go to our office during business hours (Monday-Thursday from 8:00 AM to 4:00 PM) (Friday 8:00 AM to 12:00 Noon) or if you have a scheduled appointment with [place], prior to your surgery, and ask for it by name. In addition, you will need to provide [place] with your name, name of your surgeon, type of surgery, and date of procedure or surgery.  *Opioid medications include: morphine, codeine, oxycodone, oxymorphone, hydrocodone, hydromorphone, meperidine, tramadol, tapentadol, buprenorphine, fentanyl, methadone. **Benzodiazepine medications include: diazepam (Valium), alprazolam (Xanax), clonazepam (Klonopine), lorazepam (Ativan), clorazepate (Tranxene), chlordiazepoxide (Librium), estazolam (Prosom), oxazepam (Serax), temazepam (Restoril), triazolam (Halcion)  ____________________________________________________________________________________________ Pain Management Discharge Instructions  General Discharge Instructions :  If you need to reach your doctor call: Monday-Friday 8:00 am - 4:00 pm at [phone] or toll free [phone].  After clinic hours [phone] to have operator reach doctor.  Bring all of your medication bottles  to all your appointments in the pain clinic.  To cancel or reschedule your appointment with Pain Management please remember to call 24 hours in advance to avoid a fee.  Refer to the educational materials which you have been given on: General Risks, I had my Procedure. Discharge Instructions, Post Sedation.  Post Procedure Instructions:  The drugs you were given will stay in your system until tomorrow, so for the next 24 hours you should not drive, make any legal decisions or drink any alcoholic beverages.  You may eat anything you prefer, but it is better to start with liquids then soups and crackers, and gradually work up to solid foods.  Please notify your doctor immediately if you have any unusual bleeding, trouble breathing or pain that is not related to your normal pain.  Depending on the type of procedure that was done, some parts of your body may feel week and/or numb.  This usually clears up by tonight or the next day.  Walk with the use of an assistive device or accompanied by an adult for the 24 hours.  You may use ice on the affected area for the first 24 hours.  Put ice in a Ziploc bag and cover with a towel and place against area 15 minutes on 15 minutes off.  You may switch to heat after 24 hours.Pain Management Discharge Instructions  General Discharge Instructions :  If you need to reach your doctor call: Monday-Friday 8:00 am - 4:00 pm at [phone] or toll free [phone].  After clinic hours [phone] to have operator reach doctor.  Bring all of your medication bottles to all your appointments in the pain clinic.  To cancel or reschedule your appointment with Pain Management please remember to call 24 hours in advance to avoid a fee.  Refer to the educational materials which you have been given on:  General Risks, I had my Procedure. Discharge Instructions, Post Sedation.  Post Procedure Instructions:  The drugs you were given will stay in your system until  tomorrow, so for the next 24 hours you should not drive, make any legal decisions or drink any alcoholic beverages.  You may eat anything you prefer, but it is better to start with liquids then soups and crackers, and gradually work up to solid foods.  Please notify your doctor immediately if you have any unusual bleeding, trouble breathing or pain that is not related to your normal pain.  Depending on the type of procedure that was done, some parts of your body may feel week and/or numb.  This usually clears up by tonight or the next day.  Walk with the use of an assistive device or accompanied by an adult for the 24 hours.  You may use ice on the affected area for the first 24 hours.  Put ice in a Ziploc bag and cover with a towel and place against area 15 minutes on 15 minutes off.  You may switch to heat after 24 hours.

## 2017-02-21 NOTE — Progress Notes (Signed)
Patient's Name: Erica [name]  MRN: [id number]  Referring Provider: Denton Lank, MD  DOB: [birth year]  PCP: Denton Lank, MD  DOS: 02/21/2017  Note by: Vevelyn Francois NP  Service setting: Ambulatory outpatient  Specialty: Interventional Pain Management  Location: [facility] (AMB) Pain Management Facility    Patient type: Established    Primary Reason(s) for Visit: Encounter for prescription drug management & post-procedure evaluation of chronic illness with mild to moderate exacerbation(Level of risk: moderate) CC: Neck Pain; Back Pain (mid); Hip Pain (left); and Knee Pain (left)  HPI  Erica [name] is a 51 y.o. year old, female patient, who comes today for a post-procedure evaluation and medication management. She has Chest pain; Hyponatremia; Shortness of breath; Swelling; Cough; COPD (chronic obstructive pulmonary disease) with acute bronchitis ([place]); Renal insufficiency; Pulmonary HTN ([place]); COPD, mild ([place]); Chronic diastolic heart failure ([place]); Smoker; Obstructive sleep apnea; Tachycardia; Depression; Long term current use of opiate analgesic; Long term prescription opiate use; Opiate use; Chronic low back pain (Location of Primary Source of Pain) (Bilateral) (L>R); Chronic hip pain (Location of Secondary source of pain) (Bilateral) (L>R); Chronic knee pain (Location of Tertiary source of pain) (Bilateral) (L>R); Chronic neck pain (Bilateral) (R>L); Chronic upper back pain (midline); Chronic foot pain (bottom of feet) (Bilateral) (R>L); Chronic hand pain (Bilateral); Peripheral neuropathy, idiopathic (upper and lower extremity); Chronic sacroiliac joint pain (Bilateral) (R>L); Lumbar facet syndrome (Bilateral) (R>L); Chronic pain syndrome; Elevated sedimentation rate; Elevated C-reactive protein (CRP); Neurogenic pain; Vitamin D deficiency; Lumbar spondylosis; Osteoarthritis of knee (Bilateral) (L>R); Knee hemarthrosis (Left); Intermittent left thoracic Muscle cramps; Spasm of thoracolumbar muscle (Left);  Post-traumatic osteoarthritis of knee (Left); Acute postoperative pain; and Hemarthrosis involving knee joint, left on her problem list. Her primarily concern today is the Neck Pain; Back Pain (mid); Hip Pain (left); and Knee Pain (left)  Pain Assessment: Location: Left Hip (back) Radiating: legs spasm in thighs  Onset: More than a month ago Duration: Chronic pain Quality: Spasm Severity: 4  (hip pain. )/10 (self-reported pain score)  Note: Reported level is compatible with observation.                   Effect on ADL: going up and down stairs is very difficult.  bending is also very difficult all d/t hip pain.  knee pain is also causing some instability.  Timing: Constant Modifying factors: sitting still helps back pain.    Erica [name] was last seen on 02/09/2017 for a procedure. During today's appointment we reviewed Erica [name]'s post-procedure results, as well as her outpatient medication regimen. She admits that she had greater than 50% relief in her lower back. She states that she is able to do some things that she has not been able to do in the past. She also admits that she continues to have pain in her hip.  She states that she can not sleep secondary to this pain. She states that she deos have some numbness in her leg and foot related to the hip pain.   Further details on both, my assessment(s), as well as the proposed treatment plan, please see below.  Controlled Substance Pharmacotherapy Assessment REMS (Risk Evaluation and Mitigation Strategy)  Analgesic:Hydrocodone/APAP 5/325 one tablet every 6 hours (20 mg/day) MME/day:64m/day  Erica [name]  02/21/2017  2:09 PM  Sign at close encounter Nursing Pain Medication Assessment:  Safety precautions to be maintained throughout the outpatient stay will include: orient to surroundings, keep bed in low position, maintain call bell  within reach at all times, provide assistance with transfer out of bed and ambulation.  Medication  Inspection Compliance: Pill count conducted under aseptic conditions, in front of the patient. Neither the pills nor the bottle was removed from the patient's sight at any time. Once count was completed pills were immediately returned to the patient in their original bottle.  Medication: Hydrocodone/APAP Pill/Patch Count: 0 of 60 pills remain Pill/Patch Appearance: Markings consistent with prescribed medication Bottle Appearance: Standard pharmacy container. Clearly labeled. Filled Date: 06 / 29 / 2018 Last Medication intake:  Day before yesterday   Pharmacokinetics: Liberation and absorption (onset of action): WNL Distribution (time to peak effect): WNL Metabolism and excretion (duration of action): WNL         Pharmacodynamics: Desired effects: Analgesia: Erica [name] reports >50% benefit. Functional ability: Patient reports that medication allows her to accomplish basic ADLs Clinically meaningful improvement in function (CMIF): Sustained CMIF goals met Perceived effectiveness: Described as relatively effective, allowing for increase in activities of daily living (ADL) Undesirable effects: Side-effects or Adverse reactions: None reported Monitoring: [place] PMP: Online review of the past 11-monthperiod conducted. Compliant with practice rules and regulations List of all UDS test(s) done:  Lab Results  Component Value Date   TOXASSSELUR FINAL 08/31/2016   SUMMARY FINAL 04/29/2016   Last UDS on record: ToxAssure Select 13  Date Value Ref Range Status  08/31/2016 FINAL  Final    Comment:    ==================================================================== TOXASSURE SELECT 13 (MW) ==================================================================== Specimen Alert Note:  Urinary creatinine is very low; ability to detect some drugs may be compromised; creatinine-normalized drug concentrations should be interpreted with caution. Suggest  recollection. ==================================================================== Test                             Result       Flag       Units Drug Present and Declared for Prescription Verification   Hydrocodone                    2520         EXPECTED   ng/mg creat    Sources of hydrocodone include scheduled prescription    medications. ==================================================================== Test                      Result    Flag   Units      Ref Range   Creatinine              5         L      mg/dL      >=20 ==================================================================== Declared Medications:  The flagging and interpretation on this report are based on the  following declared medications.  Unexpected results may arise from  inaccuracies in the declared medications.  **Note: The testing scope of this panel includes these medications:  Hydrocodone (Hydrocodone-Acetaminophen)  **Note: The testing scope of this panel does not include following  reported medications:  Acetaminophen (Hydrocodone-Acetaminophen)  Albuterol  Aspirin  Atorvastatin  Azithromycin  Cholecalciferol  Diltiazem  Duloxetine  Fluconazole  Fluticasone  Folic acid  Furosemide  Pantoprazole  Potassium  Prednisone  Pregabalin  Salmeterol  Sucralfate  Tiotropium  Vitamin D2 (Ergocalciferol) ==================================================================== For clinical consultation, please call ([phone] ====================================================================    Summary  Date Value Ref Range Status  04/29/2016 FINAL  Final    Comment:    ==================================================================== TOXASSURE COMP DRUG  ANALYSIS,UR ==================================================================== Test                             Result       Flag       Units Drug Present and Declared for Prescription Verification   Hydrocodone                     1739         EXPECTED   ng/mg creat   Hydromorphone                  211          EXPECTED   ng/mg creat   Dihydrocodeine                 134          EXPECTED   ng/mg creat   Norhydrocodone                 718          EXPECTED   ng/mg creat    Sources of hydrocodone include scheduled prescription    medications. Hydromorphone, dihydrocodeine and norhydrocodone are    expected metabolites of hydrocodone. Hydromorphone and    dihydrocodeine are also available as scheduled prescription    medications.   Pregabalin                     PRESENT      EXPECTED   Duloxetine                     PRESENT      EXPECTED   Acetaminophen                  PRESENT      EXPECTED Drug Absent but Declared for Prescription Verification   Salicylate                     Not Detected UNEXPECTED    Aspirin, as indicated in the declared medication list, is not    always detected even when used as directed.   Diltiazem                      Not Detected UNEXPECTED ==================================================================== Test                      Result    Flag   Units      Ref Range   Creatinine              241              mg/dL      >=20 ==================================================================== Declared Medications:  The flagging and interpretation on this report are based on the  following declared medications.  Unexpected results may arise from  inaccuracies in the declared medications.  **Note: The testing scope of this panel includes these medications:  Diltiazem (Cardizem)  Duloxetine (Cymbalta)  Hydrocodone (Hydrocodone-Acetaminophen)  Pregabalin (Lyrica)  **Note: The testing scope of this panel does not include small to  moderate amounts of these reported medications:  Acetaminophen (Hydrocodone-Acetaminophen)  Aspirin  **Note: The testing scope of this panel does not include following  reported medications:  Albuterol  Atorvastatin (Lipitor)  Folic acid (Folvite)   Furosemide (Lasix)  Pantoprazole (Protonix)  Potassium  Sucralfate (Carafate)  Tiotropium (Spiriva) ==================================================================== For clinical  consultation, please call [phone]. ====================================================================    UDS interpretation: Compliant          Medication Assessment Form: Reviewed. Patient indicates being compliant with therapy Treatment compliance: Compliant Risk Assessment Profile: Aberrant behavior: See prior evaluations. None observed or detected today Comorbid factors increasing risk of overdose: See prior notes. No additional risks detected today Risk of substance use disorder (SUD): Low Opioid Risk Tool (ORT) Total Score: 3  Interpretation Table:  Score <3 = Low Risk for SUD  Score between 4-7 = Moderate Risk for SUD  Score >8 = High Risk for Opioid Abuse   Risk Mitigation Strategies:  Patient Counseling: Covered Patient-Prescriber Agreement (PPA): Present and active  Notification to other healthcare providers: Done  Pharmacologic Plan: No change in therapy, at this time  Post-Procedure Assessment   Procedure: 12/15/16 Pre-procedure pain score:  4/10 Post-procedure pain score: 0/10         Influential Factors: BMI: 30.71 kg/m Intra-procedural challenges: None observed.         Assessment challenges: None detected.              Reported side-effects: None.        Post-procedural adverse reactions or complications: None reported         Sedation: Please see nurses note. When no sedatives are used, the analgesic levels obtained are directly associated to the effectiveness of the local anesthetics. However, when sedation is provided, the level of analgesia obtained during the initial 1 hour following the intervention, is believed to be the result of a combination of factors. These factors may include, but are not limited to: 1. The effectiveness of the local anesthetics used. 2.  The effects of the analgesic(s) and/or anxiolytic(s) used. 3. The degree of discomfort experienced by the patient at the time of the procedure. 4. The patients ability and reliability in recalling and recording the events. 5. The presence and influence of possible secondary gains and/or psychosocial factors. Reported result: Relief experienced during the 1st hour after the procedure: 100 % (Ultra-Short Term Relief)            Interpretative annotation: Clinically appropriate result. Analgesia during this period is likely to be Local Anesthetic and/or IV Sedative (Analgesic/Anxiolytic) related.          Effects of local anesthetic: The analgesic effects attained during this period are directly associated to the localized infiltration of local anesthetics and therefore cary significant diagnostic value as to the etiological location, or anatomical origin, of the pain. Expected duration of relief is directly dependent on the pharmacodynamics of the local anesthetic used. Long-acting (4-6 hours) anesthetics used.  Reported result: Relief during the next 4 to 6 hour after the procedure: 100 % (Short-Term Relief)            Interpretative annotation: Clinically appropriate result. Analgesia during this period is likely to be Local Anesthetic-related.          Long-term benefit: Defined as the period of time past the expected duration of local anesthetics (1 hour for short-acting and 4-6 hours for long-acting). With the possible exception of prolonged sympathetic blockade from the local anesthetics, benefits during this period are typically attributed to, or associated with, other factors such as analgesic sensory neuropraxia, antiinflammatory effects, or beneficial biochemical changes provided by agents other than the local anesthetics.  Reported result: Extended relief following procedure: 55 % (Long-Term Relief)            Interpretative annotation: Clinically appropriate result.  Good relief. No permanent  benefit expected. Inflammation plays a part in the etiology to the pain.          Current benefits: Defined as persistent relief that continues at this point in time.   Reported results: Treated area: >50 %       Interpretative annotation: Recurrence of symptoms. No permanent benefit expected. Effective diagnostic intervention.          Interpretation: Results would suggest adequate radiofrequency ablation.  She does continue to have hip pain                Plan:  Please see "Plan of Care" for details.  Laboratory Chemistry  Inflammation Markers (CRP: Acute Phase) (ESR: Chronic Phase) Lab Results  Component Value Date   CRP 1.3 (H) 04/30/2016   ESRSEDRATE 31 (H) 04/30/2016                 Renal Function Markers Lab Results  Component Value Date   BUN 14 06/14/2016   CREATININE 0.95 06/14/2016   GFRAA >60 06/14/2016   GFRNONAA >60 06/14/2016                 Hepatic Function Markers Lab Results  Component Value Date   AST 27 06/14/2016   ALT 26 06/14/2016   ALBUMIN 4.2 06/14/2016   ALKPHOS 83 06/14/2016                 Electrolytes Lab Results  Component Value Date   NA 136 06/14/2016   K 3.4 (L) 06/14/2016   CL 101 06/14/2016   CALCIUM 9.4 06/14/2016   MG 1.9 04/30/2016                 Neuropathy Markers Lab Results  Component Value Date   VITAMINB12 248 04/30/2016                 Bone Pathology Markers Lab Results  Component Value Date   ALKPHOS 83 06/14/2016   25OHVITD1 15 (L) 04/30/2016   25OHVITD2 <1.0 04/30/2016   25OHVITD3 15 04/30/2016   CALCIUM 9.4 06/14/2016                 Coagulation Parameters Lab Results  Component Value Date   PLT 249 06/14/2016                 Cardiovascular Markers Lab Results  Component Value Date   BNP 42.0 06/14/2016   HGB 13.9 06/14/2016   HCT 40.7 06/14/2016                 Note: Lab results reviewed.  Recent Diagnostic Imaging Review  Dg C-arm 1-60 Min-no Report  Result Date: 12/15/2016 Fluoroscopy  was utilized by the requesting physician.  No radiographic interpretation.   Note: Imaging results reviewed.          Meds   Current Meds  Medication Sig  . albuterol (PROAIR HFA) 108 (90 Base) MCG/ACT inhaler Inhale into the lungs 4 (four) times daily.  Marland Kitchen aspirin EC 81 MG EC tablet Take 1 tablet (81 mg total) by mouth daily.  Marland Kitchen atorvastatin (LIPITOR) 40 MG tablet Take 40 mg by mouth daily.  . Cholecalciferol (VITAMIN D3) 2000 units capsule Take 1 capsule (2,000 Units total) by mouth daily.  Marland Kitchen diltiazem (CARDIZEM CD) 120 MG 24 hr capsule Take 1 capsule (120 mg total) by mouth daily.  . DULoxetine (CYMBALTA) 60 MG capsule Take 60 mg by mouth daily.  . Fluticasone-Salmeterol (  ADVAIR DISKUS IN) Inhale into the lungs 2 (two) times daily.  . folic acid (FOLVITE) 1 MG tablet Take 1 mg by mouth daily.  . furosemide (LASIX) 40 MG tablet Take 40 mg by mouth 2 (two) times daily. Take an additional 40 mg in the am for weight gain as needed  . OXYGEN Inhale into the lungs. 2 liters at bedtime  . pantoprazole (PROTONIX) 40 MG tablet Take 1 tablet (40 mg total) by mouth daily.  . potassium chloride SA (K-DUR,KLOR-CON) 20 MEQ tablet Take 20 mEq by mouth 2 (two) times daily.  . sucralfate (CARAFATE) 1 G tablet Take 1 tablet (1 g total) by mouth 4 (four) times daily -  with meals and at bedtime.  Marland Kitchen tiotropium (SPIRIVA HANDIHALER) 18 MCG inhalation capsule Place 18 mcg into inhaler and inhale daily.    ROS  Constitutional: Denies any fever or chills Gastrointestinal: No reported hemesis, hematochezia, vomiting, or acute GI distress Musculoskeletal: Denies any acute onset joint swelling, redness, loss of ROM, or weakness Neurological: No reported episodes of acute onset apraxia, aphasia, dysarthria, agnosia, amnesia, paralysis, loss of coordination, or loss of consciousness  Allergies  Ms. Dormer is allergic to tramadol and penicillins.  [place]  Drug: Ms. Pohlman  reports that she does not use  drugs. Alcohol:  reports that she drinks alcohol. Tobacco:  reports that she quit smoking about 20 months ago. Her smoking use included Cigarettes. She has a 36.00 pack-year smoking history. She has never used smokeless tobacco. Medical:  has a past medical history of Arthritis; Asthma; CHF (congestive heart failure) ([place]); COPD (chronic obstructive pulmonary disease) ([place]); Gastric ulcer; Hyperlipidemia; Hypertension; and OSA on CPAP (no ins - no cpap machine for the last 4 years). Surgical: Ms. Righetti  has a past surgical history that includes Knee surgery (Left) and Flexible bronchoscopy (N/A, 06/20/2015). Family: family history includes Breast cancer in her mother; Diabetes in her maternal grandmother; Lung cancer in her father.  Constitutional Exam  General appearance: Well nourished, well developed, and well hydrated. In no apparent acute distress Vitals:   02/21/17 1401  BP: 133/77  Pulse: (!) 101  Resp: 16  Temp: 98.4 F (36.9 C)  TempSrc: Oral  SpO2: 98%  Weight: 202 lb (91.6 kg)  Height: 5' 8" (1.727 m)   BMI Assessment: Estimated body mass index is 30.71 kg/m as calculated from the following:   Height as of this encounter: 5' 8" (1.727 m).   Weight as of this encounter: 202 lb (91.6 kg).  BMI interpretation table: BMI level Category Range association with higher incidence of chronic pain  <18 kg/m2 Underweight   18.5-24.9 kg/m2 Ideal body weight   25-29.9 kg/m2 Overweight Increased incidence by 20%  30-34.9 kg/m2 Obese (Class I) Increased incidence by 68%  35-39.9 kg/m2 Severe obesity (Class II) Increased incidence by 136%  >40 kg/m2 Extreme obesity (Class III) Increased incidence by 254%   BMI Readings from Last 4 Encounters:  02/21/17 30.71 kg/m  12/23/16 31.13 kg/m  12/15/16 30.41 kg/m  12/07/16 31.38 kg/m   Wt Readings from Last 4 Encounters:  02/21/17 202 lb (91.6 kg)  12/23/16 204 lb 12 oz (92.9 kg)  12/15/16 200 lb (90.7 kg)  12/07/16 206 lb 6 oz  (93.6 kg)  Psych/Mental status: Alert, oriented x 3 (person, place, & time)       Eyes: PERLA Respiratory: No evidence of acute respiratory distress  Cervical Spine Area Exam  Skin & Axial Inspection: No masses, redness, edema, swelling,  or associated skin lesions Alignment: Symmetrical Functional ROM: Unrestricted ROM      Stability: No instability detected Muscle Tone/Strength: Functionally intact. No obvious neuro-muscular anomalies detected. Sensory (Neurological): Unimpaired Palpation: No palpable anomalies              Upper Extremity (UE) Exam    Side: Right upper extremity  Side: Left upper extremity  Skin & Extremity Inspection: Skin color, temperature, and hair growth are WNL. No peripheral edema or cyanosis. No masses, redness, swelling, asymmetry, or associated skin lesions. No contractures.  Skin & Extremity Inspection: Skin color, temperature, and hair growth are WNL. No peripheral edema or cyanosis. No masses, redness, swelling, asymmetry, or associated skin lesions. No contractures.  Functional ROM: Unrestricted ROM          Functional ROM: Unrestricted ROM          Muscle Tone/Strength: Functionally intact. No obvious neuro-muscular anomalies detected.  Muscle Tone/Strength: Functionally intact. No obvious neuro-muscular anomalies detected.  Sensory (Neurological): Unimpaired  Sensory (Neurological): Unimpaired  Palpation: No palpable anomalies              Palpation: No palpable anomalies              Specialized Test(s): Deferred         Specialized Test(s): Deferred          Thoracic Spine Area Exam  Skin & Axial Inspection: No masses, redness, or swelling Alignment: Symmetrical Functional ROM: Unrestricted ROM Stability: No instability detected Muscle Tone/Strength: Functionally intact. No obvious neuro-muscular anomalies detected. Sensory (Neurological): Unimpaired Muscle strength & Tone: No palpable anomalies  Lumbar Spine Area Exam  Skin & Axial Inspection: No  masses, redness, or swelling Alignment: Symmetrical Functional ROM: Unrestricted ROM      Stability: No instability detected Muscle Tone/Strength: Functionally intact. No obvious neuro-muscular anomalies detected. Sensory (Neurological): Unimpaired Palpation: No palpable anomalies       Provocative Tests: Lumbar Hyperextension and rotation test: evaluation deferred today       Lumbar Lateral bending test: evaluation deferred today       Patrick's Maneuver: Positive             for left hip arthralgia  Gait & Posture Assessment  Ambulation: Unassisted Gait: Relatively normal for age and body habitus Posture: WNL   Lower Extremity Exam    Side: Right lower extremity  Side: Left lower extremity  Skin & Extremity Inspection: Skin color, temperature, and hair growth are WNL. No peripheral edema or cyanosis. No masses, redness, swelling, asymmetry, or associated skin lesions. No contractures.  Skin & Extremity Inspection: Skin color, temperature, and hair growth are WNL. No peripheral edema or cyanosis. No masses, redness, swelling, asymmetry, or associated skin lesions. No contractures.  Functional ROM: Unrestricted ROM          Functional ROM: Unrestricted ROM          Muscle Tone/Strength: Functionally intact. No obvious neuro-muscular anomalies detected.  Muscle Tone/Strength: Functionally intact. No obvious neuro-muscular anomalies detected.  Sensory (Neurological): Unimpaired  Sensory (Neurological): Unimpaired  Palpation: No palpable anomalies  Palpation: No palpable anomalies   Assessment  Primary Diagnosis & Pertinent Problem List: The primary encounter diagnosis was Chronic left hip pain. Diagnoses of Lumbar facet syndrome (Bilateral) (R>L), Intermittent left thoracic Muscle cramps, Spasm of thoracolumbar muscle (Left), Post-traumatic osteoarthritis of knee (Left), Peripheral neuropathy, idiopathic (upper and lower extremity), Neurogenic pain, Chronic pain syndrome, and Long term  current use of opiate analgesic were also pertinent  to this visit.  Status Diagnosis  Controlled Controlled Controlled 1. Chronic left hip pain   2. Lumbar facet syndrome (Bilateral) (R>L)   3. Intermittent left thoracic Muscle cramps   4. Spasm of thoracolumbar muscle (Left)   5. Post-traumatic osteoarthritis of knee (Left)   6. Peripheral neuropathy, idiopathic (upper and lower extremity)   7. Neurogenic pain   8. Chronic pain syndrome   9. Long term current use of opiate analgesic     Problems updated and reviewed during this visit: No problems updated. Plan of Care  Pharmacotherapy (Medications Ordered): Meds ordered this encounter  Medications  . HYDROcodone-acetaminophen (NORCO/VICODIN) 5-325 MG tablet    Sig: Take 1 tablet by mouth every 12 (twelve) hours as needed for moderate pain.    Dispense:  60 tablet    Refill:  0    Patient may have prescription filled one day early if pharmacy is closed on scheduled refill date. Do not fill until: 02/21/2017 To last until:03/23/2017    Order Specific Question:   Supervising Provider    Answer:   Milinda Pointer [phone]  . HYDROcodone-acetaminophen (NORCO/VICODIN) 5-325 MG tablet    Sig: Take 1 tablet by mouth every 12 (twelve) hours as needed for moderate pain.    Dispense:  60 tablet    Refill:  0    Patient may have prescription filled one day early if pharmacy is closed on scheduled refill date. Do not fill until: 03/23/2017 To last until: 04/22/2017    Order Specific Question:   Supervising Provider    Answer:   Milinda Pointer [phone]  . HYDROcodone-acetaminophen (NORCO/VICODIN) 5-325 MG tablet    Sig: Take 1 tablet by mouth every 12 (twelve) hours as needed for moderate pain.    Dispense:  60 tablet    Refill:  0    Patient may have prescription filled one day early if pharmacy is closed on scheduled refill date. Do not fill until: 04/22/2017 To last until: 05/22/2017    Order Specific Question:   Supervising Provider     Answer:   Milinda Pointer [phone]  . DISCONTD: tiZANidine (ZANAFLEX) 4 MG capsule    Sig: Take 1 capsule (4 mg total) by mouth 3 (three) times daily as needed for muscle spasms.    Dispense:  90 capsule    Refill:  2    Do not place this medication, or any other prescription from our practice, on "Automatic Refill". Patient may have prescription filled one day early if pharmacy is closed on scheduled refill date.    Order Specific Question:   Supervising Provider    Answer:   Milinda Pointer [phone]  . pregabalin (LYRICA) 75 MG capsule    Sig: Take 1 capsule (75 mg total) by mouth 3 (three) times daily.    Dispense:  270 capsule    Refill:  0    Do not add this medication to the electronic "Automatic Refill" notification system. Patient may have prescription filled one day early if pharmacy is closed on scheduled refill date.    Order Specific Question:   Supervising Provider    Answer:   Milinda Pointer [phone]  . tiZANidine (ZANAFLEX) 4 MG capsule    Sig: Take 1 capsule (4 mg total) by mouth 3 (three) times daily as needed for muscle spasms.    Dispense:  90 capsule    Refill:  2    Do not place this medication, or any other prescription from our practice, on "Automatic Refill".  Patient may have prescription filled one day early if pharmacy is closed on scheduled refill date.    Order Specific Question:   Supervising Provider    Answer:   Milinda Pointer [[id number]]   New Prescriptions   No medications on file   Medications administered today: Ms. Sorenson had no medications administered during this visit. Lab-work, procedure(s), and/or referral(s): Orders Placed This Encounter  Procedures  . HIP INJECTION  . ToxASSURE Select 13 (MW), Urine   Imaging and/or referral(s): None   Interventional therapies: Planned, scheduled, and/or pending:   Left intra-articular hip joint injection no sedation   Considering:   Diagnostic bilateral lumbar facet block  #3 Possible bilateral lumbar facet radiofrequency ablation  Possible bilateral diagnostic sacroiliac joint block  Possible bilateral intra-articular hip joint injection  Possible bilateral intra-articular knee joint injection Possible bilateral genicular nerve block  Possible bilateral knee joint radiofrequency ablation.  Possible left-sided lumbar epidural steroid injection Possible right-sided cervical epidural steroid injection Possible bilateral diagnostic cervical facet block Possible bilateral diagnostic greater occipital nerve blocks Possible greater occipital nerve radiofrequency ablation.   Palliative PRN treatment(s):   Palliative bilateral lumbar facet radiofrequency ablation    Provider-requested follow-up: Return in about 3 months (around 05/24/2017) for MedMgmt.  Future Appointments Date Time Provider [place]  03/09/2017 1:20 PM Alisa Graff, FNP [facility] None  03/15/2017 10:45 AM Milinda Pointer, MD [facility] None  05/24/2017 12:45 PM Vevelyn Francois, NP [facility] None   Primary Care Physician: Denton Lank, MD Location: [facility] Note by: Vevelyn Francois NP Date: 02/21/2017; Time: 1:13 PM  Pain Score Disclaimer: We use the NRS-11 scale. This is a self-reported, subjective measurement of pain severity with only modest accuracy. It is used primarily to identify changes within a particular patient. It must be understood that outpatient pain scales are significantly less accurate that those used for research, where they can be applied under ideal controlled circumstances with minimal exposure to variables. In reality, the score is likely to be a combination of pain intensity and pain affect, where pain affect describes the degree of emotional arousal or changes in action readiness caused by the sensory experience of pain. Factors such as social and work situation, setting, emotional state, anxiety levels, expectation, and prior pain  experience may influence pain perception and show large inter-individual differences that may also be affected by time variables.  Patient instructions provided during this appointment: Patient Instructions  med  ____________________________________________________________________________________________  Medication Rules  Applies to: All patients receiving prescriptions (written or electronic).  Pharmacy of record: Pharmacy where electronic prescriptions will be sent. If written prescriptions are taken to a different pharmacy, please inform the nursing staff. The pharmacy listed in the electronic medical record should be the one where you would like electronic prescriptions to be sent.  Prescription refills: Only during scheduled appointments. Applies to both, written and electronic prescriptions.  NOTE: The following applies primarily to controlled substances (Opioid* Pain Medications).   Patient's responsibilities: 1. Pain Pills: Bring all pain pills to every appointment (except for procedure appointments). 2. Pill Bottles: Bring pills in original pharmacy bottle. Always bring newest bottle. Bring bottle, even if empty. 3. Medication refills: You are responsible for knowing and keeping track of what medications you need refilled. The day before your appointment, write a list of all prescriptions that need to be refilled. Bring that list to your appointment and give it to the admitting nurse. Prescriptions will be written only during appointments. If you  forget a medication, it will not be "Called in", "Faxed", or "electronically sent". You will need to get another appointment to get these prescribed. 4. Prescription Accuracy: You are responsible for carefully inspecting your prescriptions before leaving our office. Have the discharge nurse carefully go over each prescription with you, before taking them home. Make sure that your name is accurately spelled, that your address is correct. Check  the name and dose of your medication to make sure it is accurate. Check the number of pills, and the written instructions to make sure they are clear and accurate. Make sure that you are given enough medication to last until your next medication refill appointment. 5. Taking Medication: Take medication as prescribed. Never take more pills than instructed. Never take medication more frequently than prescribed. Taking less pills or less frequently is permitted and encouraged, when it comes to controlled substances (written prescriptions).  6. Inform other Doctors: Always inform, all of your healthcare providers, of all the medications you take. 7. Pain Medication from other Providers: You are not allowed to accept any additional pain medication from any other Doctor or Healthcare provider. There are two exceptions to this rule. (see below) In the event that you require additional pain medication, you are responsible for notifying us, as stated below. 8. Medication Agreement: You are responsible for carefully reading and following our Medication Agreement. This must be signed before receiving any prescriptions from our practice. Safely store a copy of your signed Agreement. Violations to the Agreement will result in no further prescriptions. (Additional copies of our Medication Agreement are available upon request.) 9. Laws, Rules, & Regulations: All patients are expected to follow all Federal and [employer], Rules, [employer]. Ignorance of the Laws does not constitute a valid excuse. The use of any illegal substances is prohibited. 10. Adopted CDC guidelines & recommendations: Target dosing levels will be at or below 60 MME/day. Use of benzodiazepines** is not recommended.  Exceptions: There are only two exceptions to the rule of not receiving pain medications from other Healthcare Providers. 1. Exception #1 (Emergencies): In the event of an emergency (i.e.: accident requiring emergency care),  you are allowed to receive additional pain medication. However, you are responsible for: As soon as you are able, call our office [phone], at any time of the day or night, and leave a message stating your name, the date and nature of the emergency, and the name and dose of the medication prescribed. In the event that your call is answered by a member of our staff, make sure to document and save the date, time, and the name of the person that took your information.  2. Exception #2 (Planned Surgery): In the event that you are scheduled by another doctor or dentist to have any type of surgery or procedure, you are allowed (for a period no longer than 30 days), to receive additional pain medication, for the acute post-op pain. However, in this case, you are responsible for picking up a copy of our "Post-op Pain Management for Surgeons" handout, and giving it to your surgeon or dentist. This document is available at our office, and does not require an appointment to obtain it. Simply go to our office during business hours (Monday-Thursday from 8:00 AM to 4:00 PM) (Friday 8:00 AM to 12:00 Noon) or if you have a scheduled appointment with [place], prior to your surgery, and ask for it by name. In addition, you will need to provide [place] with your name, name  of your surgeon, type of surgery, and date of procedure or surgery.  *Opioid medications include: morphine, codeine, oxycodone, oxymorphone, hydrocodone, hydromorphone, meperidine, tramadol, tapentadol, buprenorphine, fentanyl, methadone. **Benzodiazepine medications include: diazepam (Valium), alprazolam (Xanax), clonazepam (Klonopine), lorazepam (Ativan), clorazepate (Tranxene), chlordiazepoxide (Librium), estazolam (Prosom), oxazepam (Serax), temazepam (Restoril), triazolam (Halcion)  ____________________________________________________________________________________________ Pain Management Discharge Instructions  General Discharge Instructions :  If you  need to reach your doctor call: Monday-Friday 8:00 am - 4:00 pm at [phone] or toll free [phone].  After clinic hours [phone] to have operator reach doctor.  Bring all of your medication bottles to all your appointments in the pain clinic.  To cancel or reschedule your appointment with Pain Management please remember to call 24 hours in advance to avoid a fee.  Refer to the educational materials which you have been given on: General Risks, I had my Procedure. Discharge Instructions, Post Sedation.  Post Procedure Instructions:  The drugs you were given will stay in your system until tomorrow, so for the next 24 hours you should not drive, make any legal decisions or drink any alcoholic beverages.  You may eat anything you prefer, but it is better to start with liquids then soups and crackers, and gradually work up to solid foods.  Please notify your doctor immediately if you have any unusual bleeding, trouble breathing or pain that is not related to your normal pain.  Depending on the type of procedure that was done, some parts of your body may feel week and/or numb.  This usually clears up by tonight or the next day.  Walk with the use of an assistive device or accompanied by an adult for the 24 hours.  You may use ice on the affected area for the first 24 hours.  Put ice in a Ziploc bag and cover with a towel and place against area 15 minutes on 15 minutes off.  You may switch to heat after 24 hours.Pain Management Discharge Instructions  General Discharge Instructions :  If you need to reach your doctor call: Monday-Friday 8:00 am - 4:00 pm at [phone] or toll free [phone].  After clinic hours [phone] to have operator reach doctor.  Bring all of your medication bottles to all your appointments in the pain clinic.  To cancel or reschedule your appointment with Pain Management please remember to call 24 hours in advance to avoid a fee.  Refer to the  educational materials which you have been given on: General Risks, I had my Procedure. Discharge Instructions, Post Sedation.  Post Procedure Instructions:  The drugs you were given will stay in your system until tomorrow, so for the next 24 hours you should not drive, make any legal decisions or drink any alcoholic beverages.  You may eat anything you prefer, but it is better to start with liquids then soups and crackers, and gradually work up to solid foods.  Please notify your doctor immediately if you have any unusual bleeding, trouble breathing or pain that is not related to your normal pain.  Depending on the type of procedure that was done, some parts of your body may feel week and/or numb.  This usually clears up by tonight or the next day.  Walk with the use of an assistive device or accompanied by an adult for the 24 hours.  You may use ice on the affected area for the first 24 hours.  Put ice in a Ziploc bag and cover with a towel and place against area 15 minutes on  15 minutes off.  You may switch to heat after 24 hours.

## 2017-02-21 NOTE — Progress Notes (Deleted)
Patient's Name: Erica [name]  MRN: [id number]  Referring Provider: Denton Lank, MD  DOB: July 14, 1966  PCP: Denton Lank, MD  DOS: 02/21/2017  Note by: Vevelyn Francois NP  Service setting: Ambulatory outpatient  Specialty: Interventional Pain Management  Location: [facility] (AMB) Pain Management Facility    Patient type: Established    Primary Reason(s) for Visit: Encounter for prescription drug management. (Level of risk: moderate)  CC: No chief complaint on file.  HPI  Erica [name] is a 51 y.o. year old, female patient, who comes today for a medication management evaluation. She has Chest pain; Hyponatremia; Shortness of breath; Swelling; Cough; COPD (chronic obstructive pulmonary disease) with acute bronchitis ([place]); Renal insufficiency; Pulmonary HTN ([place]); COPD, mild ([place]); Chronic diastolic heart failure ([place]); Smoker; Obstructive sleep apnea; Tachycardia; Depression; Long term current use of opiate analgesic; Long term prescription opiate use; Opiate use; Chronic low back pain (Location of Primary Source of Pain) (Bilateral) (L>R); Chronic hip pain (Location of Secondary source of pain) (Bilateral) (L>R); Chronic knee pain (Location of Tertiary source of pain) (Bilateral) (L>R); Chronic neck pain (Bilateral) (R>L); Chronic upper back pain (midline); Chronic foot pain (bottom of feet) (Bilateral) (R>L); Chronic hand pain (Bilateral); Peripheral neuropathy, idiopathic (upper and lower extremity); Chronic sacroiliac joint pain (Bilateral) (R>L); Lumbar facet syndrome (Bilateral) (R>L); Chronic pain syndrome; Elevated sedimentation rate; Elevated C-reactive protein (CRP); Neurogenic pain; Vitamin D deficiency; Lumbar spondylosis; Osteoarthritis of knee (Bilateral) (L>R); Knee hemarthrosis (Left); Intermittent left thoracic Muscle cramps; Spasm of thoracolumbar muscle (Left); Post-traumatic osteoarthritis of knee (Left); Acute postoperative pain; and Hemarthrosis involving knee joint, left on her problem list.  Her primarily concern today is the No chief complaint on file.  Pain Assessment: Location:     Radiating:   Onset:   Duration:   Quality:   Severity:  /10 (self-reported pain score)  Note: Reported level is compatible with observation.                   Effect on ADL:   Timing:   Modifying factors:    Erica [name] was last scheduled for an appointment on 02/09/2017 for medication management. During today's appointment we reviewed Erica [name]'s chronic pain status, as well as her outpatient medication regimen.  The patient  reports that she does not use drugs. Her body mass index is unknown because there is no height or weight on file.  Further details on both, my assessment(s), as well as the proposed treatment plan, please see below.  Controlled Substance Pharmacotherapy Assessment REMS (Risk Evaluation and Mitigation Strategy)  Analgesic: *** MME/day: *** mg/day.  No notes on file Pharmacokinetics: Liberation and absorption (onset of action): WNL Distribution (time to peak effect): WNL Metabolism and excretion (duration of action): WNL         Pharmacodynamics: Desired effects: Analgesia: Erica [name] reports >50% benefit. Functional ability: Patient reports that medication allows her to accomplish basic ADLs Clinically meaningful improvement in function (CMIF): Sustained CMIF goals met Perceived effectiveness: Described as relatively effective, allowing for increase in activities of daily living (ADL) Undesirable effects: Side-effects or Adverse reactions: None reported Monitoring: [place] PMP: Online review of the past 61-monthperiod conducted. Compliant with practice rules and regulations List of all UDS test(s) done:  Lab Results  Component Value Date   TOXASSSELUR FINAL 08/31/2016   SUMMARY FINAL 04/29/2016   Last UDS on record: ToxAssure Select 13  Date Value Ref Range Status  08/31/2016 FINAL  Final    Comment:     ====================================================================  TOXASSURE SELECT 13 (MW) ==================================================================== Specimen Alert Note:  Urinary creatinine is very low; ability to detect some drugs may be compromised; creatinine-normalized drug concentrations should be interpreted with caution. Suggest recollection. ==================================================================== Test                             Result       Flag       Units Drug Present and Declared for Prescription Verification   Hydrocodone                    2520         EXPECTED   ng/mg creat    Sources of hydrocodone include scheduled prescription    medications. ==================================================================== Test                      Result    Flag   Units      Ref Range   Creatinine              5         L      mg/dL      >=20 ==================================================================== Declared Medications:  The flagging and interpretation on this report are based on the  following declared medications.  Unexpected results may arise from  inaccuracies in the declared medications.  **Note: The testing scope of this panel includes these medications:  Hydrocodone (Hydrocodone-Acetaminophen)  **Note: The testing scope of this panel does not include following  reported medications:  Acetaminophen (Hydrocodone-Acetaminophen)  Albuterol  Aspirin  Atorvastatin  Azithromycin  Cholecalciferol  Diltiazem  Duloxetine  Fluconazole  Fluticasone  Folic acid  Furosemide  Pantoprazole  Potassium  Prednisone  Pregabalin  Salmeterol  Sucralfate  Tiotropium  Vitamin D2 (Ergocalciferol) ==================================================================== For clinical consultation, please call [phone]. ====================================================================    Summary  Date Value Ref Range Status  04/29/2016  FINAL  Final    Comment:    ==================================================================== TOXASSURE COMP DRUG ANALYSIS,UR ==================================================================== Test                             Result       Flag       Units Drug Present and Declared for Prescription Verification   Hydrocodone                    1739         EXPECTED   ng/mg creat   Hydromorphone                  211          EXPECTED   ng/mg creat   Dihydrocodeine                 134          EXPECTED   ng/mg creat   Norhydrocodone                 718          EXPECTED   ng/mg creat    Sources of hydrocodone include scheduled prescription    medications. Hydromorphone, dihydrocodeine and norhydrocodone are    expected metabolites of hydrocodone. Hydromorphone and    dihydrocodeine are also available as scheduled prescription    medications.   Pregabalin  PRESENT      EXPECTED   Duloxetine                     PRESENT      EXPECTED   Acetaminophen                  PRESENT      EXPECTED Drug Absent but Declared for Prescription Verification   Salicylate                     Not Detected UNEXPECTED    Aspirin, as indicated in the declared medication list, is not    always detected even when used as directed.   Diltiazem                      Not Detected UNEXPECTED ==================================================================== Test                      Result    Flag   Units      Ref Range   Creatinine              241              mg/dL      >=20 ==================================================================== Declared Medications:  The flagging and interpretation on this report are based on the  following declared medications.  Unexpected results may arise from  inaccuracies in the declared medications.  **Note: The testing scope of this panel includes these medications:  Diltiazem (Cardizem)  Duloxetine (Cymbalta)  Hydrocodone  (Hydrocodone-Acetaminophen)  Pregabalin (Lyrica)  **Note: The testing scope of this panel does not include small to  moderate amounts of these reported medications:  Acetaminophen (Hydrocodone-Acetaminophen)  Aspirin  **Note: The testing scope of this panel does not include following  reported medications:  Albuterol  Atorvastatin (Lipitor)  Folic acid (Folvite)  Furosemide (Lasix)  Pantoprazole (Protonix)  Potassium  Sucralfate (Carafate)  Tiotropium (Spiriva) ==================================================================== For clinical consultation, please call [phone]. ====================================================================    UDS interpretation: Compliant          Medication Assessment Form: Reviewed. Patient indicates being compliant with therapy Treatment compliance: Compliant Risk Assessment Profile: Aberrant behavior: See prior evaluations. None observed or detected today Comorbid factors increasing risk of overdose: See prior notes. No additional risks detected today Risk of substance use disorder (SUD): Low Opioid Risk Tool (ORT) Total Score:    Interpretation Table:  Score <3 = Low Risk for SUD  Score between 4-7 = Moderate Risk for SUD  Score >8 = High Risk for Opioid Abuse   Risk Mitigation Strategies:  Patient Counseling: Covered Patient-Prescriber Agreement (PPA): Present and active  Notification to other healthcare providers: Done  Pharmacologic Plan: No change in therapy, at this time  Laboratory Chemistry  Inflammation Markers (CRP: Acute Phase) (ESR: Chronic Phase) Lab Results  Component Value Date   CRP 1.3 (H) 04/30/2016   ESRSEDRATE 31 (H) 04/30/2016                 Renal Function Markers Lab Results  Component Value Date   BUN 14 06/14/2016   CREATININE 0.95 06/14/2016   GFRAA >60 06/14/2016   GFRNONAA >60 06/14/2016                 Hepatic Function Markers Lab Results  Component Value Date   AST 27  06/14/2016   ALT 26 06/14/2016   ALBUMIN 4.2 06/14/2016   ALKPHOS 83  06/14/2016                 Electrolytes Lab Results  Component Value Date   NA 136 06/14/2016   K 3.4 (L) 06/14/2016   CL 101 06/14/2016   CALCIUM 9.4 06/14/2016   MG 1.9 04/30/2016                 Neuropathy Markers Lab Results  Component Value Date   VITAMINB12 248 04/30/2016                 Bone Pathology Markers Lab Results  Component Value Date   ALKPHOS 83 06/14/2016   25OHVITD1 15 (L) 04/30/2016   25OHVITD2 <1.0 04/30/2016   25OHVITD3 15 04/30/2016   CALCIUM 9.4 06/14/2016                 Coagulation Parameters Lab Results  Component Value Date   PLT 249 06/14/2016                 Cardiovascular Markers Lab Results  Component Value Date   BNP 42.0 06/14/2016   HGB 13.9 06/14/2016   HCT 40.7 06/14/2016                 Note: Lab results reviewed.  Recent Diagnostic Imaging Review  Dg C-arm 1-60 Min-no Report  Result Date: 12/15/2016 Fluoroscopy was utilized by the requesting physician.  No radiographic interpretation.   Note: Imaging results reviewed.          Meds   No outpatient prescriptions have been marked as taking for the 02/21/17 encounter (Clinical Support) with Vevelyn Francois, NP.    ROS  Constitutional: Denies any fever or chills Gastrointestinal: No reported hemesis, hematochezia, vomiting, or acute GI distress Musculoskeletal: Denies any acute onset joint swelling, redness, loss of ROM, or weakness Neurological: No reported episodes of acute onset apraxia, aphasia, dysarthria, agnosia, amnesia, paralysis, loss of coordination, or loss of consciousness  Allergies  Erica [name] is allergic to tramadol and penicillins.  [place]  Drug: Erica [name]  reports that she does not use drugs. Alcohol:  reports that she drinks alcohol. Tobacco:  reports that she quit smoking about 20 months ago. Her smoking use included Cigarettes. She has a 36.00 pack-year smoking history. She  has never used smokeless tobacco. Medical:  has a past medical history of Arthritis; Asthma; CHF (congestive heart failure) ([place]); COPD (chronic obstructive pulmonary disease) ([place]); Gastric ulcer; Hyperlipidemia; Hypertension; and OSA on CPAP (no ins - no cpap machine for the last 4 years). Surgical: Ms. Breidenbach  has a past surgical history that includes Knee surgery (Left) and Flexible bronchoscopy (N/A, 06/20/2015). Family: family history includes Breast cancer in her mother; Diabetes in her maternal grandmother; Lung cancer in her father.  Constitutional Exam  General appearance: Well nourished, well developed, and well hydrated. In no apparent acute distress There were no vitals filed for this visit. BMI Assessment: Estimated body mass index is 31.13 kg/m as calculated from the following:   Height as of 12/23/16: _0  (1.727 m).   Weight as of 12/23/16: 204 lb 12 oz (92.9 kg).  BMI interpretation table: BMI level Category Range association with higher incidence of chronic pain  <18 kg/m2 Underweight   18.5-24.9 kg/m2 Ideal body weight   25-29.9 kg/m2 Overweight Increased incidence by 20%  30-34.9 kg/m2 Obese (Class I) Increased incidence by 68%  35-39.9 kg/m2 Severe obesity (Class II) Increased incidence by 136%  >40 kg/m2 Extreme obesity (Class III)  Increased incidence by 254%   BMI Readings from Last 4 Encounters:  12/23/16 31.13 kg/m  12/15/16 30.41 kg/m  12/07/16 31.38 kg/m  11/10/16 31.63 kg/m   Wt Readings from Last 4 Encounters:  12/23/16 204 lb 12 oz (92.9 kg)  12/15/16 200 lb (90.7 kg)  12/07/16 206 lb 6 oz (93.6 kg)  11/10/16 208 lb (94.3 kg)  Psych/Mental status: Alert, oriented x 3 (person, place, & time)       Eyes: PERLA Respiratory: No evidence of acute respiratory distress  Cervical Spine Area Exam  Skin & Axial Inspection: No masses, redness, edema, swelling, or associated skin lesions Alignment: Symmetrical Functional ROM: Unrestricted ROM       Stability: No instability detected Muscle Tone/Strength: Functionally intact. No obvious neuro-muscular anomalies detected. Sensory (Neurological): Unimpaired Palpation: No palpable anomalies              Upper Extremity (UE) Exam    Side: Right upper extremity  Side: Left upper extremity  Skin & Extremity Inspection: Skin color, temperature, and hair growth are WNL. No peripheral edema or cyanosis. No masses, redness, swelling, asymmetry, or associated skin lesions. No contractures.  Skin & Extremity Inspection: Skin color, temperature, and hair growth are WNL. No peripheral edema or cyanosis. No masses, redness, swelling, asymmetry, or associated skin lesions. No contractures.  Functional ROM: Unrestricted ROM          Functional ROM: Unrestricted ROM          Muscle Tone/Strength: Functionally intact. No obvious neuro-muscular anomalies detected.  Muscle Tone/Strength: Functionally intact. No obvious neuro-muscular anomalies detected.  Sensory (Neurological): Unimpaired  Sensory (Neurological): Unimpaired  Palpation: No palpable anomalies              Palpation: No palpable anomalies              Specialized Test(s): Deferred         Specialized Test(s): Deferred          Thoracic Spine Area Exam  Skin & Axial Inspection: No masses, redness, or swelling Alignment: Symmetrical Functional ROM: Unrestricted ROM Stability: No instability detected Muscle Tone/Strength: Functionally intact. No obvious neuro-muscular anomalies detected. Sensory (Neurological): Unimpaired Muscle strength & Tone: No palpable anomalies  Lumbar Spine Area Exam  Skin & Axial Inspection: No masses, redness, or swelling Alignment: Symmetrical Functional ROM: Unrestricted ROM      Stability: No instability detected Muscle Tone/Strength: Functionally intact. No obvious neuro-muscular anomalies detected. Sensory (Neurological): Unimpaired Palpation: No palpable anomalies       Provocative Tests: Lumbar  Hyperextension and rotation test: evaluation deferred today       Lumbar Lateral bending test: evaluation deferred today       Patrick's Maneuver: evaluation deferred today                    Gait & Posture Assessment  Ambulation: Unassisted Gait: Relatively normal for age and body habitus Posture: WNL   Lower Extremity Exam    Side: Right lower extremity  Side: Left lower extremity  Skin & Extremity Inspection: Skin color, temperature, and hair growth are WNL. No peripheral edema or cyanosis. No masses, redness, swelling, asymmetry, or associated skin lesions. No contractures.  Skin & Extremity Inspection: Skin color, temperature, and hair growth are WNL. No peripheral edema or cyanosis. No masses, redness, swelling, asymmetry, or associated skin lesions. No contractures.  Functional ROM: Unrestricted ROM          Functional ROM: Unrestricted ROM  Muscle Tone/Strength: Functionally intact. No obvious neuro-muscular anomalies detected.  Muscle Tone/Strength: Functionally intact. No obvious neuro-muscular anomalies detected.  Sensory (Neurological): Unimpaired  Sensory (Neurological): Unimpaired  Palpation: No palpable anomalies  Palpation: No palpable anomalies   Assessment  Primary Diagnosis & Pertinent Problem List: There were no encounter diagnoses.  Status Diagnosis  Controlled Controlled Controlled No diagnosis found.  Problems updated and reviewed during this visit: No problems updated. Plan of Care  Pharmacotherapy (Medications Ordered): No orders of the defined types were placed in this encounter.  New Prescriptions   No medications on file   Medications administered today: Erica [name] had no medications administered during this visit. Lab-work, procedure(s), and/or referral(s): No orders of the defined types were placed in this encounter.  Imaging and/or referral(s): None  Interventional therapies: Planned, scheduled, and/or pending:   Not at this time.    Considering:   ***   Palliative PRN treatment(s):   ***   Provider-requested follow-up: Return in about 3 months (around 05/24/2017) for MedMgmt.  Future Appointments Date Time Provider [place]  03/09/2017 1:20 PM Alisa Graff, [place] [facility] None   Primary Care Physician: Denton Lank, MD Location: [facility] Note by: Vevelyn Francois NP Date: 02/21/2017; Time: 1:43 PM  Pain Score Disclaimer: We use the NRS-11 scale. This is a self-reported, subjective measurement of pain severity with only modest accuracy. It is used primarily to identify changes within a particular patient. It must be understood that outpatient pain scales are significantly less accurate that those used for research, where they can be applied under ideal controlled circumstances with minimal exposure to variables. In reality, the score is likely to be a combination of pain intensity and pain affect, where pain affect describes the degree of emotional arousal or changes in action readiness caused by the sensory experience of pain. Factors such as social and work situation, setting, emotional state, anxiety levels, expectation, and prior pain experience may influence pain perception and show large inter-individual differences that may also be affected by time variables.  Patient instructions provided during this appointment: Patient Instructions  med  ____________________________________________________________________________________________  Medication Rules  Applies to: All patients receiving prescriptions (written or electronic).  Pharmacy of record: Pharmacy where electronic prescriptions will be sent. If written prescriptions are taken to a different pharmacy, please inform the nursing staff. The pharmacy listed in the electronic medical record should be the one where you would like electronic prescriptions to be sent.  Prescription refills: Only during scheduled  appointments. Applies to both, written and electronic prescriptions.  NOTE: The following applies primarily to controlled substances (Opioid* Pain Medications).   Patient's responsibilities: 1. Pain Pills: Bring all pain pills to every appointment (except for procedure appointments). 2. Pill Bottles: Bring pills in original pharmacy bottle. Always bring newest bottle. Bring bottle, even if empty. 3. Medication refills: You are responsible for knowing and keeping track of what medications you need refilled. The day before your appointment, write a list of all prescriptions that need to be refilled. Bring that list to your appointment and give it to the admitting nurse. Prescriptions will be written only during appointments. If you forget a medication, it will not be "Called in", "Faxed", or "electronically sent". You will need to get another appointment to get these prescribed. 4. Prescription Accuracy: You are responsible for carefully inspecting your prescriptions before leaving our office. Have the discharge nurse carefully go over each prescription with you, before taking them home. Make sure that  your name is accurately spelled, that your address is correct. Check the name and dose of your medication to make sure it is accurate. Check the number of pills, and the written instructions to make sure they are clear and accurate. Make sure that you are given enough medication to last until your next medication refill appointment. 5. Taking Medication: Take medication as prescribed. Never take more pills than instructed. Never take medication more frequently than prescribed. Taking less pills or less frequently is permitted and encouraged, when it comes to controlled substances (written prescriptions).  6. Inform other Doctors: Always inform, all of your healthcare providers, of all the medications you take. 7. Pain Medication from other Providers: You are not allowed to accept any additional pain  medication from any other Doctor or Healthcare provider. There are two exceptions to this rule. (see below) In the event that you require additional pain medication, you are responsible for notifying us, as stated below. 8. Medication Agreement: You are responsible for carefully reading and following our Medication Agreement. This must be signed before receiving any prescriptions from our practice. Safely store a copy of your signed Agreement. Violations to the Agreement will result in no further prescriptions. (Additional copies of our Medication Agreement are available upon request.) 9. Laws, Rules, & Regulations: All patients are expected to follow all Federal and [employer], Rules, [employer]. Ignorance of the Laws does not constitute a valid excuse. The use of any illegal substances is prohibited. 10. Adopted CDC guidelines & recommendations: Target dosing levels will be at or below 60 MME/day. Use of benzodiazepines** is not recommended.  Exceptions: There are only two exceptions to the rule of not receiving pain medications from other Healthcare Providers. 1. Exception #1 (Emergencies): In the event of an emergency (i.e.: accident requiring emergency care), you are allowed to receive additional pain medication. However, you are responsible for: As soon as you are able, call our office [phone], at any time of the day or night, and leave a message stating your name, the date and nature of the emergency, and the name and dose of the medication prescribed. In the event that your call is answered by a member of our staff, make sure to document and save the date, time, and the name of the person that took your information.  2. Exception #2 (Planned Surgery): In the event that you are scheduled by another doctor or dentist to have any type of surgery or procedure, you are allowed (for a period no longer than 30 days), to receive additional pain medication, for the acute post-op pain.  However, in this case, you are responsible for picking up a copy of our "Post-op Pain Management for Surgeons" handout, and giving it to your surgeon or dentist. This document is available at our office, and does not require an appointment to obtain it. Simply go to our office during business hours (Monday-Thursday from 8:00 AM to 4:00 PM) (Friday 8:00 AM to 12:00 Noon) or if you have a scheduled appointment with [place], prior to your surgery, and ask for it by name. In addition, you will need to provide [place] with your name, name of your surgeon, type of surgery, and date of procedure or surgery.  *Opioid medications include: morphine, codeine, oxycodone, oxymorphone, hydrocodone, hydromorphone, meperidine, tramadol, tapentadol, buprenorphine, fentanyl, methadone. **Benzodiazepine medications include: diazepam (Valium), alprazolam (Xanax), clonazepam (Klonopine), lorazepam (Ativan), clorazepate (Tranxene), chlordiazepoxide (Librium), estazolam (Prosom), oxazepam (Serax), temazepam (Restoril), triazolam (Halcion)  ____________________________________________________________________________________________

## 2017-02-24 LAB — TOXASSURE SELECT 13 (MW), URINE

## 2017-03-01 ENCOUNTER — Other Ambulatory Visit: Payer: Self-pay | Admitting: Cardiovascular Disease

## 2017-03-09 ENCOUNTER — Telehealth: Payer: Self-pay | Admitting: Family

## 2017-03-09 ENCOUNTER — Ambulatory Visit: Payer: Medicaid Other | Admitting: Family

## 2017-03-09 NOTE — Telephone Encounter (Signed)
Patient did not show for her [facility] appointment on 03/09/17. Will attempt to reschedule.

## 2017-03-15 ENCOUNTER — Ambulatory Visit
Admission: RE | Admit: 2017-03-15 | Discharge: 2017-03-15 | Disposition: A | Discharge status: Home / Self Care | Payer: Medicaid Other | Source: Ambulatory Visit | Attending: Pain Medicine | Admitting: Pain Medicine

## 2017-03-15 ENCOUNTER — Encounter: Payer: Self-pay | Admitting: Pain Medicine

## 2017-03-15 ENCOUNTER — Ambulatory Visit (HOSPITAL_BASED_OUTPATIENT_CLINIC_OR_DEPARTMENT_OTHER): Payer: Medicaid Other | Admitting: Pain Medicine

## 2017-03-15 VITALS — BP 145/85 | HR 86 | Temp 98.4°F | Resp 16 | Ht 68.0 in | Wt 202.0 lb

## 2017-03-15 DIAGNOSIS — M545 Low back pain, unspecified: Secondary | ICD-10-CM

## 2017-03-15 DIAGNOSIS — M25552 Pain in left hip: Secondary | ICD-10-CM

## 2017-03-15 DIAGNOSIS — M25562 Pain in left knee: Secondary | ICD-10-CM | POA: Insufficient documentation

## 2017-03-15 DIAGNOSIS — M170 Bilateral primary osteoarthritis of knee: Secondary | ICD-10-CM | POA: Diagnosis present

## 2017-03-15 DIAGNOSIS — M160 Bilateral primary osteoarthritis of hip: Secondary | ICD-10-CM | POA: Insufficient documentation

## 2017-03-15 DIAGNOSIS — G8929 Other chronic pain: Secondary | ICD-10-CM | POA: Insufficient documentation

## 2017-03-15 DIAGNOSIS — Z880 Allergy status to penicillin: Secondary | ICD-10-CM | POA: Diagnosis not present

## 2017-03-15 DIAGNOSIS — M25062 Hemarthrosis, left knee: Secondary | ICD-10-CM

## 2017-03-15 DIAGNOSIS — M25561 Pain in right knee: Secondary | ICD-10-CM

## 2017-03-15 DIAGNOSIS — M1610 Unilateral primary osteoarthritis, unspecified hip: Secondary | ICD-10-CM | POA: Diagnosis not present

## 2017-03-15 MED ORDER — IOPAMIDOL (ISOVUE-M 200) INJECTION 41%
10.0000 mL | Freq: Once | INTRAMUSCULAR | Status: AC
  Administered 2017-03-15: 10 mL via INTRA_ARTICULAR
  Filled 2017-03-15: qty 10

## 2017-03-15 MED ORDER — METHYLPREDNISOLONE ACETATE 80 MG/ML IJ SUSP
80.0000 mg | Freq: Once | INTRAMUSCULAR | Status: AC
  Administered 2017-03-15: 80 mg via INTRA_ARTICULAR

## 2017-03-15 MED ORDER — LIDOCAINE HCL 2 % IJ SOLN
10.0000 mL | Freq: Once | INTRAMUSCULAR | Status: AC
  Administered 2017-03-15: 400 mg

## 2017-03-15 MED ORDER — METHYLPREDNISOLONE ACETATE 80 MG/ML IJ SUSP
INTRAMUSCULAR | Status: AC
  Filled 2017-03-15: qty 1

## 2017-03-15 MED ORDER — ROPIVACAINE HCL 2 MG/ML IJ SOLN
4.0000 mL | Freq: Once | INTRAMUSCULAR | Status: AC
Start: 2017-03-15 — End: 2017-03-15
  Administered 2017-03-15: 10 mL via INTRA_ARTICULAR

## 2017-03-15 MED ORDER — ROPIVACAINE HCL 2 MG/ML IJ SOLN
INTRAMUSCULAR | Status: AC
  Filled 2017-03-15: qty 10

## 2017-03-15 NOTE — Progress Notes (Signed)
Patient's Name: Erica [name]  MRN: [id number]  Referring Provider: Hillery Aldo, MD  DOB: 08-Feb-1966  PCP: Hillery Aldo, MD  DOS: 03/15/2017  Note by: Oswaldo Done, MD  Service setting: Ambulatory outpatient  Specialty: Interventional Pain Management  Patient type: Established  Location: [facility] (AMB) Pain Management Facility  Visit type: Interventional Procedure   Primary Reason for Visit: Interventional Pain Management Treatment. CC: Hip Pain (LEFT)  Procedure:  Anesthesia, Analgesia, Anxiolysis:  Type: Therapeutic Intra-Articular Hip Injection Region:  Posterolateral hip joint area. Level: Lower pelvic and hip joint level. Laterality: Left-Sided  Type: Local Anesthesia Local Anesthetic: Lidocaine 1% Route: Infiltration ([place]/IM) IV Access: Declined Sedation: Declined  Indication(s): Analgesia          Indications: 1. Chronic left hip pain   2. 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 Chronic low back pain (Location of Primary Source of Pain) (Bilateral) (L>R)   4. Chronic knee pain (Location of Tertiary source of pain) (Bilateral) (L>R)   5. Hemarthrosis, left knee   6. 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 is a 51 y.o. (year old), female patient, seen today for interventional treatment. She  has a past surgical history that includes Knee surgery (Left) and Flexible bronchoscopy (N/A, 06/20/2015). [name] has a current medication list which includes the following prescription(s): albuterol, aspirin, atorvastatin, vitamin d3, diltiazem, duloxetine, fluticasone-salmeterol, folic acid, furosemide, hydrocodone-acetaminophen, hydrocodone-acetaminophen, hydrocodone-acetaminophen, oxygen-helium, pantoprazole, potassium chloride sa, pregabalin, sucralfate, tiotropium, tizanidine, and oxycodone-acetaminophen. Her primarily concern today is the Hip Pain (LEFT)  Initial Vital Signs:  [name] pressure 116/69, pulse 86, temperature 98.4 F (36.9 C), temperature source Oral, resp. rate 16, height 5\' 8"  (1.727 m), weight 202 lb (91.6 kg), last menstrual period 03/13/2015, SpO2 96 %. BMI: Estimated body mass index is 30.71 kg/m as calculated from the following:   Height as of this encounter: 5\' 8"  (1.727 m).   Weight as of this encounter: 202 lb (91.6 kg).  Risk Assessment: Allergies: Reviewed. She is allergic to tramadol and penicillins.  Allergy Precautions: None required Coagulopathies: Reviewed. None identified.  [name]-thinner therapy: None at this time Active Infection(s): Reviewed. None identified. Ms. Flow is afebrile  Site Confirmation: [name] was asked to confirm the procedure and laterality before marking the site Procedure checklist: Completed Consent: Before the procedure and under the influence of no sedative(s), amnesic(s), or anxiolytics, the patient was informed of the treatment options, risks and possible complications. To fulfill our ethical and legal obligations, as recommended by the American Medical Association's Code of Ethics, I have informed the patient of my clinical impression; the nature and purpose of the treatment or procedure; the risks, benefits, and possible complications of the intervention; the alternatives, including doing nothing; the risk(s) and benefit(s) of the alternative treatment(s) or procedure(s); and the risk(s) and benefit(s) of doing nothing. The patient was provided information about the general risks and possible complications associated with the procedure. These may include, but are not limited to: failure to achieve desired goals, infection, bleeding, organ or nerve damage, allergic reactions, paralysis, and death. In addition, the patient was informed of those risks and complications associated to the procedure, such as failure to decrease pain; infection; bleeding; organ or nerve damage with subsequent damage to sensory,  motor, and/or autonomic systems, resulting in permanent pain, numbness, and/or weakness of one or several areas of the body; allergic reactions; (i.e.: anaphylactic reaction); and/or death. Furthermore,  the patient was informed of those risks and complications associated with the medications. These include, but are not limited to: allergic reactions (i.e.: anaphylactic or anaphylactoid reaction(s)); adrenal axis suppression; [name] sugar elevation that in diabetics may result in ketoacidosis or comma; water retention that in patients with history of congestive heart failure may result in shortness of breath, pulmonary edema, and decompensation with resultant heart failure; weight gain; swelling or edema; medication-induced neural toxicity; particulate matter embolism and [name] vessel occlusion with resultant organ, and/or nervous system infarction; and/or aseptic necrosis of one or more joints. Finally, the patient was informed that Medicine is not an exact science; therefore, there is also the possibility of unforeseen or unpredictable risks and/or possible complications that may result in a catastrophic outcome. The patient indicated having understood very clearly. We have given the patient no guarantees and we have made no promises. Enough time was given to the patient to ask questions, all of which were answered to the patient's satisfaction. [name] has indicated that she wanted to continue with the procedure. Attestation: I, the ordering provider, attest that I have discussed with the patient the benefits, risks, side-effects, alternatives, likelihood of achieving goals, and potential problems during recovery for the procedure that I have provided informed consent. Date: 03/15/2017; Time: 11:26 AM  Pre-Procedure Preparation:  Monitoring: As per clinic protocol. Respiration, ETCO2, SpO2, BP, heart rate and rhythm monitor placed and checked for adequate function Safety Precautions: Patient was assessed  for positional comfort and pressure points before starting the procedure. Time-out: I initiated and conducted the "Time-out" before starting the procedure, as per protocol. The patient was asked to participate by confirming the accuracy of the "Time Out" information. Verification of the correct person, site, and procedure were performed and confirmed by me, the nursing staff, and the patient. "Time-out" conducted as per Joint Commission's Universal Protocol (UP.01.01.01). "Time-out" Date & Time: 03/15/2017; 1149 hrs.  Description of Procedure Process:   Position: Lateral Decubitus with bad side up Target Area: Superior aspect of the hip joint cavity, going thru the superior portion of the capsular ligament. Approach: Posterolateral approach. Area Prepped: Entire Posterolateral hip area. Prepping solution: ChloraPrep (2% chlorhexidine gluconate and 70% isopropyl alcohol) Safety Precautions: Aspiration looking for [name] return was conducted prior to all injections. At no point did we inject any substances, as a needle was being advanced. No attempts were made at seeking any paresthesias. Safe injection practices and needle disposal techniques used. Medications properly checked for expiration dates. SDV (single dose vial) medications used. Description of the Procedure: Protocol guidelines were followed. The patient was placed in position over the fluoroscopy table. The target area was identified and the area prepped in the usual manner. Skin & deeper tissues infiltrated with local anesthetic. Appropriate amount of time allowed to pass for local anesthetics to take effect. The procedure needles were then advanced to the target area. Proper needle placement secured. Negative aspiration confirmed. Solution injected in intermittent fashion, asking for systemic symptoms every 0.5cc of injectate. The needles were then removed and the area cleansed, making sure to leave some of the prepping solution back to take  advantage of its long term bactericidal properties. Vitals:   03/15/17 1146 03/15/17 1149 03/15/17 1154 03/15/17 1159  BP: 123/75 120/72 105/65 (!) 145/85  Pulse:      Resp: (!) 24 19 (!) 24 16  Temp:      TempSrc:      SpO2: 95% 97% 94% 99%  Weight:  Height:        Start Time: 1150 hrs. End Time: 1159 hrs. Materials:  Needle(s) Type: Regular needle Gauge: 22G Length: 3.5-in Medication(s): We administered iopamidol, methylPREDNISolone acetate, ropivacaine (PF) 2 mg/mL (0.2%), and lidocaine. Please see chart orders for dosing details.  Imaging Guidance (Non-Spinal):  Type of Imaging Technique: Fluoroscopy Guidance (Non-Spinal) Indication(s): Assistance in needle guidance and placement for procedures requiring needle placement in or near specific anatomical locations not easily accessible without such assistance. Exposure Time: Please see nurses notes. Contrast: Before injecting any contrast, we confirmed that the patient did not have an allergy to iodine, shellfish, or radiological contrast. Once satisfactory needle placement was completed at the desired level, radiological contrast was injected. Contrast injected under live fluoroscopy. No contrast complications. See chart for type and volume of contrast used. Fluoroscopic Guidance: I was personally present during the use of fluoroscopy. "Tunnel Vision Technique" used to obtain the best possible view of the target area. Parallax error corrected before commencing the procedure. "Direction-depth-direction" technique used to introduce the needle under continuous pulsed fluoroscopy. Once target was reached, antero-posterior, oblique, and lateral fluoroscopic projection used confirm needle placement in all planes. Images permanently stored in EMR. Interpretation: I personally interpreted the imaging intraoperatively. Adequate needle placement confirmed in multiple planes. Appropriate spread of contrast into desired area was observed. No  evidence of afferent or efferent intravascular uptake. Permanent images saved into the patient's record.  Antibiotic Prophylaxis:  Indication(s): None identified Antibiotic given: None  Post-operative Assessment:  EBL: None Complications: No immediate post-treatment complications observed by team, or reported by patient. Note: The patient tolerated the entire procedure well. A repeat set of vitals were taken after the procedure and the patient was kept under observation following institutional policy, for this type of procedure. Post-procedural neurological assessment was performed, showing return to baseline, prior to discharge. The patient was provided with post-procedure discharge instructions, including a section on how to identify potential problems. Should any problems arise concerning this procedure, the patient was given instructions to immediately contact us, at any time, without hesitation. In any case, we plan to contact the patient by telephone for a follow-up status report regarding this interventional procedure. Comments:  No additional relevant information.  Plan of Care  Disposition: Discharge home  Discharge Date & Time: 03/15/2017; 1207 hrs.   Physician-requested Follow-up:  Return for post-procedure eval by Dr. Laban Emperor in 2 weeks.  Future Appointments Date Time Provider Department Center  04/18/2017 11:15 AM Delano Metz, MD [facility] None  05/24/2017 12:45 PM Barbette Merino, NP [facility] None    Imaging Orders     DG C-Arm 1-60 Min-No Report     MR KNEE LEFT WO CONTRAST  Procedure Orders     HIP INJECTION  Medications ordered for procedure: Meds ordered this encounter  Medications  . iopamidol (ISOVUE-M) 41 % intrathecal injection 10 mL  . methylPREDNISolone acetate (DEPO-MEDROL) injection 80 mg  . ropivacaine (PF) 2 mg/mL (0.2%) (NAROPIN) injection 4 mL  . lidocaine (XYLOCAINE) 2 % (with pres) injection 200 mg   Medications administered: We  administered iopamidol, methylPREDNISolone acetate, ropivacaine (PF) 2 mg/mL (0.2%), and lidocaine.  See the medical record for exact dosing, route, and time of administration.  New Prescriptions   No medications on file   Primary Care Physician: Hillery Aldo, MD Location: [facility] Note by: Oswaldo Done, MD Date: 03/15/2017; Time: 12:34 PM  Disclaimer:  Medicine is not an exact science. The only guarantee in medicine  is that nothing is guaranteed. It is important to note that the decision to proceed with this intervention was based on the information collected from the patient. The Data and conclusions were drawn from the patient's questionnaire, the interview, and the physical examination. Because the information was provided in large part by the patient, it cannot be guaranteed that it has not been purposely or unconsciously manipulated. Every effort has been made to obtain as much relevant data as possible for this evaluation. It is important to note that the conclusions that lead to this procedure are derived in large part from the available data. Always take into account that the treatment will also be dependent on availability of resources and existing treatment guidelines, considered by other Pain Management Practitioners as being common knowledge and practice, at the time of the intervention. For Medico-Legal purposes, it is also important to point out that variation in procedural techniques and pharmacological choices are the acceptable norm. The indications, contraindications, technique, and results of the above procedure should only be interpreted and judged by a Board-Certified Interventional Pain Specialist with extensive familiarity and expertise in the same exact procedure and technique.

## 2017-03-15 NOTE — Progress Notes (Signed)
Safety precautions to be maintained throughout the outpatient stay will include: orient to surroundings, keep bed in low position, maintain call bell within reach at all times, provide assistance with transfer out of bed and ambulation.  

## 2017-03-15 NOTE — Patient Instructions (Addendum)
____________________________________________________________________________________________  Post-Procedure instructions Instructions:  Apply ice: Fill a plastic sandwich bag with crushed ice. Cover it with a small towel and apply to injection site. Apply for 15 minutes then remove x 15 minutes. Repeat sequence on day of procedure, until you go to bed. The purpose is to minimize swelling and discomfort after procedure.  Apply heat: Apply heat to procedure site starting the day following the procedure. The purpose is to treat any soreness and discomfort from the procedure.  Food intake: Start with clear liquids (like water) and advance to regular food, as tolerated.   Physical activities: Keep activities to a minimum for the first 8 hours after the procedure.   Driving: If you have received any sedation, you are not allowed to drive for 24 hours after your procedure.  Blood thinner: Restart your blood thinner 6 hours after your procedure. (Only for those taking blood thinners)  Insulin: As soon as you can eat, you may resume your normal dosing schedule. (Only for those taking insulin)  Infection prevention: Keep procedure site clean and dry.  Post-procedure Pain Diary: Extremely important that this be done correctly and accurately. Recorded information will be used to determine the next step in treatment.  Pain evaluated is that of treated area only. Do not include pain from an untreated area.  Complete every hour, on the hour, for the initial 8 hours. Set an alarm to help you do this part accurately.  Do not go to sleep and have it completed later. It will not be accurate.  Follow-up appointment: Keep your follow-up appointment after the procedure. Usually 2 weeks for most procedures. (6 weeks in the case of radiofrequency.) Bring you pain diary.  Expect:  From numbing medicine (AKA: Local Anesthetics): Numbness or decrease in pain.  Onset: Full effect within 15 minutes of  injected.  Duration: It will depend on the type of local anesthetic used. On the average, 1 to 8 hours.   From steroids: Decrease in swelling or inflammation. Once inflammation is improved, relief of the pain will follow.  Onset of benefits: Depends on the amount of swelling present. The more swelling, the longer it will take for the benefits to be seen. In some cases, up to 10 days.  Duration: Steroids will stay in the system x 2 weeks. Duration of benefits will depend on multiple posibilities including persistent irritating factors.  From procedure: Some discomfort is to be expected once the numbing medicine wears off. This should be minimal if ice and heat are applied as instructed. Call if:  You experience numbness and weakness that gets worse with time, as opposed to wearing off.  New onset bowel or bladder incontinence. (Spinal procedures only)  Emergency Numbers:  Durning business hours (Monday - Thursday, 8:00 AM - 4:00 PM) (Friday, 9:00 AM - 12:00 Noon): [phone]  After hours: [phone] ____________________________________________________________________________________________  Pain Management Discharge Instructions  General Discharge Instructions :  If you need to reach your doctor call: Monday-Friday 8:00 am - 4:00 pm at [phone] or toll free [phone].  After clinic hours [phone] to have operator reach doctor.  Bring all of your medication bottles to all your appointments in the pain clinic.  To cancel or reschedule your appointment with Pain Management please remember to call 24 hours in advance to avoid a fee.  Refer to the educational materials which you have been given on: General Risks, I had my Procedure. Discharge Instructions, Post Sedation.  Post Procedure Instructions:  The drugs you   were given will stay in your system until tomorrow, so for the next 24 hours you should not drive, make any legal decisions or drink any alcoholic  beverages.  You may eat anything you prefer, but it is better to start with liquids then soups and crackers, and gradually work up to solid foods.  Please notify your doctor immediately if you have any unusual bleeding, trouble breathing or pain that is not related to your normal pain.  Depending on the type of procedure that was done, some parts of your body may feel week and/or numb.  This usually clears up by tonight or the next day.  Walk with the use of an assistive device or accompanied by an adult for the 24 hours.  You may use ice on the affected area for the first 24 hours.  Put ice in a Ziploc bag and cover with a towel and place against area 15 minutes on 15 minutes off.  You may switch to heat after 24 hours. 

## 2017-03-16 ENCOUNTER — Telehealth: Payer: Self-pay | Admitting: *Deleted

## 2017-03-16 NOTE — Telephone Encounter (Signed)
No problems post procedure. 

## 2017-03-22 ENCOUNTER — Telehealth: Payer: Self-pay | Admitting: Cardiovascular Disease

## 2017-03-22 NOTE — Telephone Encounter (Signed)
Last several months blood pressures have been doing normal but is now having very elevated pressures 200's/100's with blurred vision, dizziness, and headaches. She states that she did take some short acting cardizem but has had to do this more frequently. She now is very worried with it being this high. Reviewed symptoms to monitor for possible stroke and indications to call 911 or go to ED. Let her know that I would route this to Dr. Mariah MillingGollan for review and be in touch with some possible recommendations. She was very appreciative for the call and has no further questions at this time.   Blood pressures 183/101 199/100 200/101 190/85 216/102

## 2017-03-22 NOTE — Telephone Encounter (Signed)
Pt c/o BP issue: STAT if pt c/o blurred vision, one-sided weakness or slurred speech  1. What are your last 5 BP readings? Trend is  200-216/100-106  2. Are you having any other symptoms (ex. Dizziness, headache, blurred vision, passed out)? Dizziness headache light headed and feels like she might pass out   3. What is your BP issue?    Patient is worried. Please call

## 2017-03-22 NOTE — Telephone Encounter (Signed)
Previous blood pressures were not particularly elevated Makes me wonder if she is having pain control issues If blood pressure continues to run high, would start losartan 100 mg daily Would confirm she is on Cardizem 120 daily  Okay to refill diltiazem 30 milligrams pills to take as needed

## 2017-03-23 ENCOUNTER — Telehealth: Payer: Self-pay | Admitting: *Deleted

## 2017-03-23 ENCOUNTER — Ambulatory Visit: Payer: Medicaid Other | Attending: Family | Admitting: Family

## 2017-03-23 ENCOUNTER — Encounter: Payer: Self-pay | Admitting: Family

## 2017-03-23 VITALS — BP 138/76 | HR 79 | Ht 68.0 in | Wt 207.5 lb

## 2017-03-23 DIAGNOSIS — I272 Pulmonary hypertension, unspecified: Secondary | ICD-10-CM | POA: Diagnosis not present

## 2017-03-23 DIAGNOSIS — I509 Heart failure, unspecified: Secondary | ICD-10-CM | POA: Diagnosis present

## 2017-03-23 DIAGNOSIS — I110 Hypertensive heart disease with heart failure: Secondary | ICD-10-CM | POA: Insufficient documentation

## 2017-03-23 DIAGNOSIS — Z87891 Personal history of nicotine dependence: Secondary | ICD-10-CM | POA: Insufficient documentation

## 2017-03-23 DIAGNOSIS — E785 Hyperlipidemia, unspecified: Secondary | ICD-10-CM | POA: Insufficient documentation

## 2017-03-23 DIAGNOSIS — G4733 Obstructive sleep apnea (adult) (pediatric): Secondary | ICD-10-CM | POA: Diagnosis not present

## 2017-03-23 DIAGNOSIS — J449 Chronic obstructive pulmonary disease, unspecified: Secondary | ICD-10-CM | POA: Diagnosis not present

## 2017-03-23 DIAGNOSIS — I5032 Chronic diastolic (congestive) heart failure: Secondary | ICD-10-CM

## 2017-03-23 DIAGNOSIS — J45909 Unspecified asthma, uncomplicated: Secondary | ICD-10-CM | POA: Insufficient documentation

## 2017-03-23 MED ORDER — LOSARTAN POTASSIUM 100 MG PO TABS: 100.0000 mg | ORAL_TABLET | Freq: Every day | ORAL | 6 refills | Status: DC

## 2017-03-23 MED ORDER — DILTIAZEM HCL 30 MG PO TABS: 30.0000 mg | ORAL_TABLET | Freq: Three times a day (TID) | ORAL | 3 refills | Status: DC | PRN

## 2017-03-23 NOTE — Patient Instructions (Signed)
Resume weighing daily and call for an overnight weight gain of > 2 pounds or a weekly weight gain of >5 pounds. 

## 2017-03-23 NOTE — Telephone Encounter (Signed)
Spoke with patient regarding her blood pressures. She reports that she had two ablations done and once that was done they decreased her medications. Patient states that it could be pain control because her medications were decreased from 4 times a day to twice a day. Advised her to discuss this with her pain physician. She then stated that she has been having panic attacks due to being "held up" about a month ago. The physician she sees for that had recommended a new medication which can help with those attacks and it also helps with blood pressure. She was unsure what the exact name was but let her know to let's start this medication, monitor her blood pressures, call me back if they persist 140/80 or higher, and then I would have someone schedule follow up in about a month to see how things are going. Reviewed medication Losartan 100 mg once daily and diltiazem 30 mg three times daily as needed and sent both to her pharmacy of choice. She was agreeable with this plan and had no further questions or concerns at this time.
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
## 2017-03-24 ENCOUNTER — Ambulatory Visit: Payer: Medicaid Other

## 2017-03-24 NOTE — Telephone Encounter (Signed)
lmov to schedule an appt  °

## 2017-03-25 NOTE — Telephone Encounter (Signed)
Lmov for patient to call back and schedule appt °

## 2017-04-08 ENCOUNTER — Emergency Department: Payer: Medicaid Other

## 2017-04-08 ENCOUNTER — Emergency Department
Admission: EM | Admit: 2017-04-08 | Discharge: 2017-04-09 | Disposition: A | Discharge status: Home / Self Care | Payer: Medicaid Other | Attending: Emergency Medicine | Admitting: Emergency Medicine

## 2017-04-08 DIAGNOSIS — J449 Chronic obstructive pulmonary disease, unspecified: Secondary | ICD-10-CM | POA: Diagnosis not present

## 2017-04-08 DIAGNOSIS — Y999 Unspecified external cause status: Secondary | ICD-10-CM | POA: Diagnosis not present

## 2017-04-08 DIAGNOSIS — Y939 Activity, unspecified: Secondary | ICD-10-CM | POA: Insufficient documentation

## 2017-04-08 DIAGNOSIS — Z87891 Personal history of nicotine dependence: Secondary | ICD-10-CM | POA: Insufficient documentation

## 2017-04-08 DIAGNOSIS — Y929 Unspecified place or not applicable: Secondary | ICD-10-CM | POA: Diagnosis not present

## 2017-04-08 DIAGNOSIS — J45909 Unspecified asthma, uncomplicated: Secondary | ICD-10-CM | POA: Diagnosis not present

## 2017-04-08 DIAGNOSIS — I110 Hypertensive heart disease with heart failure: Secondary | ICD-10-CM | POA: Diagnosis not present

## 2017-04-08 DIAGNOSIS — S39012A Strain of muscle, fascia and tendon of lower back, initial encounter: Secondary | ICD-10-CM | POA: Diagnosis not present

## 2017-04-08 DIAGNOSIS — I5032 Chronic diastolic (congestive) heart failure: Secondary | ICD-10-CM | POA: Diagnosis not present

## 2017-04-08 DIAGNOSIS — S3992XA Unspecified injury of lower back, initial encounter: Secondary | ICD-10-CM | POA: Diagnosis present

## 2017-04-08 DIAGNOSIS — W19XXXA Unspecified fall, initial encounter: Secondary | ICD-10-CM | POA: Insufficient documentation

## 2017-04-08 DIAGNOSIS — Z79899 Other long term (current) drug therapy: Secondary | ICD-10-CM | POA: Insufficient documentation

## 2017-04-08 DIAGNOSIS — Z7982 Long term (current) use of aspirin: Secondary | ICD-10-CM | POA: Diagnosis not present

## 2017-04-08 DIAGNOSIS — R0602 Shortness of breath: Secondary | ICD-10-CM

## 2017-04-08 DIAGNOSIS — R42 Dizziness and giddiness: Secondary | ICD-10-CM | POA: Diagnosis not present

## 2017-04-08 DIAGNOSIS — G8929 Other chronic pain: Secondary | ICD-10-CM | POA: Insufficient documentation

## 2017-04-08 LAB — URINE DRUG SCREEN, QUALITATIVE (ARMC ONLY)
AMPHETAMINES, UR SCREEN: NOT DETECTED
BENZODIAZEPINE, UR SCRN: NOT DETECTED
Barbiturates, Ur Screen: NOT DETECTED
Cannabinoid 50 Ng, Ur ~~LOC~~: NOT DETECTED
Cocaine Metabolite,Ur ~~LOC~~: NOT DETECTED
MDMA (ECSTASY) UR SCREEN: NOT DETECTED
METHADONE SCREEN, URINE: NOT DETECTED
Opiate, Ur Screen: POSITIVE — AB
PHENCYCLIDINE (PCP) UR S: NOT DETECTED
Tricyclic, Ur Screen: NOT DETECTED

## 2017-04-08 LAB — URINALYSIS, COMPLETE (UACMP) WITH MICROSCOPIC
BACTERIA UA: NONE SEEN
Bilirubin Urine: NEGATIVE
KETONES UR: NEGATIVE mg/dL
Nitrite: NEGATIVE
PH: 5 (ref 5.0–8.0)
PROTEIN: NEGATIVE mg/dL
Specific Gravity, Urine: 1.031 — ABNORMAL HIGH (ref 1.005–1.030)

## 2017-04-08 LAB — BASIC METABOLIC PANEL
Anion gap: 13 (ref 5–15)
BUN: 8 mg/dL (ref 6–20)
CHLORIDE: 95 mmol/L — AB (ref 101–111)
CO2: 24 mmol/L (ref 22–32)
CREATININE: 0.85 mg/dL (ref 0.44–1.00)
Calcium: 9.3 mg/dL (ref 8.9–10.3)
GFR calc Af Amer: >60 mL/min (ref >60)
GFR calc non Af Amer: >60 mL/min (ref >60)
Glucose, Bld: 420 mg/dL — ABNORMAL HIGH (ref 65–99)
POTASSIUM: 3.5 mmol/L (ref 3.5–5.1)
Sodium: 132 mmol/L — ABNORMAL LOW (ref 135–145)

## 2017-04-08 LAB — CBC
HEMATOCRIT: 39.9 % (ref 35.0–47.0)
Hemoglobin: 13.4 g/dL (ref 12.0–16.0)
MCH: 28.5 pg (ref 26.0–34.0)
MCHC: 33.7 g/dL (ref 32.0–36.0)
MCV: 84.5 fL (ref 80.0–100.0)
PLATELETS: 280 10*3/uL (ref 150–440)
RBC: 4.71 MIL/uL (ref 3.80–5.20)
RDW: 17.5 % — AB (ref 11.5–14.5)
WBC: 8.6 10*3/uL (ref 3.6–11.0)

## 2017-04-08 MED ORDER — HYDROMORPHONE HCL 1 MG/ML IJ SOLN
1.0000 mg | Freq: Once | INTRAMUSCULAR | Status: AC
  Administered 2017-04-08: 1 mg via INTRAMUSCULAR
  Filled 2017-04-08: qty 1

## 2017-04-08 NOTE — ED Provider Notes (Signed)
----------------------------------------- 9:36 PM on 04/08/2017 -----------------------------------------  [facility] Emergency Department Provider Note  ____________________________________________   I have reviewed the triage vital signs and the nursing notes.   HISTORY  Chief Complaint Back Pain and Dizziness    HPI Erica [name] is a 51 y.o. female with chronic back pain on pain medication states she fell 10 days ago after starting a new blood pressure medication. She had a bruise on her back which is now resolved but she has ongoing back pain. Patient has not been able to get into the pain clinic. She states that she takes Vicodin but is "not touching" the pain in her back which is been aggravated by her fall. Patient states that 10 days ago she was started on a new medication and she stood up quickly and passed out. She hasn't passed out since and she went off the new medication. She has chronic shortness of breath every day all day for years and that is not changed denies any chest pain, she has no symptoms except for low back pain since her fall no numbness no weakness no incontinence of bowel or bladder moving about makes it worse. Staying still makes it better Patient states she is not always compliant with her medications.  She states she can do with everything but the pain from her back. She understands that the bruises completely gone but she would like to show me the photos. She does not wish to be admitted. She states she has no neurologic deficits and she states that   Past Medical History:  Diagnosis Date  . Arthritis   . Asthma   . CHF (congestive heart failure) (HCC)   . COPD (chronic obstructive pulmonary disease) (HCC)   . Gastric ulcer   . Hyperlipidemia   . Hypertension   . OSA on CPAP no ins - no cpap machine for the last 4 years    Patient Active Problem List   Diagnosis Date Noted  . 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)  03/15/2017  . Hemarthrosis, left knee 11/10/2016  . Acute postoperative pain 09/29/2016  . Osteoarthritis of knee (Bilateral) (L>R) 09/08/2016  . Intermittent left thoracic Muscle cramps 09/08/2016  . Spasm of thoracolumbar muscle (Left) 09/08/2016  . Post-traumatic osteoarthritis of knee (Left) 09/08/2016  . Lumbar spondylosis 07/05/2016  . Chronic pain syndrome 06/28/2016  . Elevated sedimentation rate 06/28/2016  . Elevated C-reactive protein (CRP) 06/28/2016  . Neurogenic pain 06/28/2016  . Vitamin D deficiency 06/28/2016  . Long term current use of opiate analgesic 04/29/2016  . Long term prescription opiate use 04/29/2016  . Opiate use 04/29/2016  . Chronic low back pain (Location of Primary Source of Pain) (Bilateral) (L>R) 04/29/2016  . Chronic hip pain (Location of Secondary source of pain) (Bilateral) (L>R) 04/29/2016  . Chronic knee pain (Location of Tertiary source of pain) (Bilateral) (L>R) 04/29/2016  . Chronic neck pain (Bilateral) (R>L) 04/29/2016  . Chronic upper back pain (midline) 04/29/2016  . Chronic foot pain (bottom of feet) (Bilateral) (R>L) 04/29/2016  . Chronic hand pain (Bilateral) 04/29/2016  . Peripheral neuropathy, idiopathic (upper and lower extremity) 04/29/2016  . Chronic sacroiliac joint pain (Bilateral) (R>L) 04/29/2016  . Lumbar facet syndrome (Bilateral) (R>L) 04/29/2016  . Depression 03/05/2016  . Chronic diastolic heart failure (HCC) 07/16/2015  . Smoker 07/16/2015  . Obstructive sleep apnea 07/16/2015  . Tachycardia 07/16/2015  . COPD, mild (HCC)   . Renal insufficiency   . Pulmonary HTN (HCC)   .  Chest pain 06/14/2015  . Hyponatremia 06/14/2015  . Shortness of breath   . Swelling   . Cough   . COPD (chronic obstructive pulmonary disease) with acute bronchitis (HCC)     Past Surgical History:  Procedure Laterality Date  . FLEXIBLE BRONCHOSCOPY N/A 06/20/2015   Procedure: FLEXIBLE BRONCHOSCOPY;  Surgeon: Shane Crutch, MD;   Location: [facility] ORS;  Service: Pulmonary;  Laterality: N/A;  . KNEE SURGERY Left    8 knee surgeries    Prior to Admission medications   Medication Sig Start Date End Date Taking? Authorizing Provider  albuterol (PROAIR HFA) 108 (90 Base) MCG/ACT inhaler Inhale into the lungs 4 (four) times daily.    [provider]  aspirin EC 81 MG EC tablet Take 1 tablet (81 mg total) by mouth daily. 06/23/15   Shaune Pollack, MD  atorvastatin (LIPITOR) 40 MG tablet Take 40 mg by mouth daily.    [provider]  Cholecalciferol (VITAMIN D3) 2000 units capsule Take 1 capsule (2,000 Units total) by mouth daily. 06/29/16   Delano Metz, MD  diltiazem (CARDIZEM CD) 120 MG 24 hr capsule Take 1 capsule (120 mg total) by mouth daily. 12/23/16   Antonieta Iba, MD  diltiazem (CARDIZEM) 30 MG tablet Take 1 tablet (30 mg total) by mouth 3 (three) times daily as needed. 03/23/17   Antonieta Iba, MD  DULoxetine (CYMBALTA) 60 MG capsule Take 60 mg by mouth daily.    [provider]  Fluticasone-Salmeterol (ADVAIR DISKUS IN) Inhale into the lungs 2 (two) times daily.    [provider]  folic acid (FOLVITE) 1 MG tablet Take 1 mg by mouth daily.    [provider]  furosemide (LASIX) 40 MG tablet Take 40 mg by mouth daily. Take an additional 40 mg in the am for weight gain as needed    [provider]  HYDROcodone-acetaminophen (NORCO/VICODIN) 5-325 MG tablet Take 1 tablet by mouth every 12 (twelve) hours as needed for moderate pain. 02/21/17 03/23/17  Barbette Merino, NP  HYDROcodone-acetaminophen (NORCO/VICODIN) 5-325 MG tablet Take 1 tablet by mouth every 12 (twelve) hours as needed for moderate pain. 03/23/17 04/22/17  Barbette Merino, NP  HYDROcodone-acetaminophen (NORCO/VICODIN) 5-325 MG tablet Take 1 tablet by mouth every 12 (twelve) hours as needed for moderate pain. 04/22/17 05/22/17  Barbette Merino, NP  losartan (COZAAR) 100 MG tablet Take 1 tablet (100 mg total) by  mouth daily. Patient not taking: Reported on 03/23/2017 03/23/17 06/21/17  Antonieta Iba, MD  oxyCODONE-acetaminophen (PERCOCET) 5-325 MG tablet Take 1 tablet by mouth every 8 (eight) hours as needed for severe pain. Patient not taking: Reported on 03/23/2017 12/15/16 03/23/17  Delano Metz, MD  OXYGEN Inhale into the lungs. 2 liters at bedtime    [provider]  pantoprazole (PROTONIX) 40 MG tablet Take 1 tablet (40 mg total) by mouth daily. 06/23/15   Shaune Pollack, MD  potassium chloride SA (K-DUR,KLOR-CON) 20 MEQ tablet Take 20 mEq by mouth 2 (two) times daily.    [provider]  pregabalin (LYRICA) 75 MG capsule Take 1 capsule (75 mg total) by mouth 3 (three) times daily. 02/21/17 05/22/17  Barbette Merino, NP  sucralfate (CARAFATE) 1 G tablet Take 1 tablet (1 g total) by mouth 4 (four) times daily -  with meals and at bedtime. 06/23/15   Shaune Pollack, MD  tiotropium (SPIRIVA HANDIHALER) 18 MCG inhalation capsule Place 18 mcg into inhaler and inhale daily.  [provider]  tiZANidine (ZANAFLEX) 4 MG capsule Take 1 capsule (4 mg total) by mouth 3 (three) times daily as needed for muscle spasms. 02/21/17 05/22/17  Barbette Merino, NP    Allergies Tramadol and Penicillins  Family History  Problem Relation Age of Onset  . Lung cancer Father   . Breast cancer Mother        early 40's  . Diabetes Maternal Grandmother     Social History Social History  Substance Use Topics  . Smoking status: Former Smoker    Packs/day: 1.00    Years: 36.00    Types: Cigarettes    Quit date: 06/13/2015  . Smokeless tobacco: Never Used  . Alcohol use 0.0 oz/week     Comment: occasionaly    Review of Systems Constitutional: No fever/chills Eyes: No visual changes. ENT: No sore throat. No stiff neck no neck pain Cardiovascular: Denies chest pain. Respiratory: Denies shortness of breath. Gastrointestinal:   no vomiting.  No diarrhea.  No constipation. Genitourinary: Negative  for dysuria. Musculoskeletal: Negative lower extremity swelling Skin: Negative for rash. Neurological: Negative for severe headaches, focal weakness or numbness.   ____________________________________________   PHYSICAL EXAM:  VITAL SIGNS: ED Triage Vitals [04/08/17 1923]  Enc Vitals Group     BP (!) 162/91     Pulse Rate 95     Resp 16     Temp 98.3 F (36.8 C)     Temp Source Oral     SpO2 96 %     Weight 202 lb (91.6 kg)     Height  (1.727 m)     Head Circumference      Peak Flow      Pain Score      Pain Loc      Pain Edu?      Excl. in GC?     Constitutional: Alert and oriented. Well appearing and in no acute distress. Eyes: Conjunctivae are normal Head: Atraumatic HEENT: No congestion/rhinnorhea. Mucous membranes are moist.  Oropharynx non-erythematous Neck:   Nontender with no meningismus, no masses, no stridor Cardiovascular: Normal rate, regular rhythm. Grossly normal heart sounds.  Good peripheral circulation. Respiratory: Normal respiratory effort.  No retractions. Lungs CTAB. Abdominal: Soft and nontender. No distention. No guarding no rebound Back: Patient has no bruising or evidence of significant drew to the back at this time she does show a photograph of when I'm thing might be a bruise. There is no midline tenderness at this time. The back is mildly tender to palpation in the paraspinal muscles of the lumbar region there is no midline tenderness there are no lesions noted. there is no CVA tenderness Musculoskeletal: No lower extremity tenderness, no upper extremity tenderness. No joint effusions, no DVT signs strong distal pulses no edema Neurologic:  Normal speech and language. No gross focal neurologic deficits are appreciated.  Skin:  Skin is warm, dry and intact. No rash noted. Psychiatric: Mood and affect are normal. Speech and behavior are normal.  ____________________________________________   LABS (all labs ordered are listed, but only  abnormal results are displayed)  Labs Reviewed  BASIC METABOLIC PANEL - Abnormal; Notable for the following:       Result Value   Sodium 132 (*)    Chloride 95 (*)    Glucose, Bld 420 (*)    All other components within normal limits  CBC - Abnormal; Notable for the following:    RDW 17.5 (*)    All other  components within normal limits  URINALYSIS, COMPLETE (UACMP) WITH MICROSCOPIC  CBG MONITORING, ED    Pertinent labs  results that were available during my care of the patient were reviewed by me and considered in my medical decision making (see chart for details). ____________________________________________  EKG  I personally interpreted any EKGs ordered by me or triage Sinus tachycardia rate 101 no acute ST elevation or depression normal axis unremarkable EKG ____________________________________________  RADIOLOGY  Pertinent labs & imaging results that were available during my care of the patient were reviewed by me and considered in my medical decision making (see chart for details). If possible, patient and/or family made aware of any abnormal findings. ____________________________________________    PROCEDURES  Procedure(s) performed: None  Procedures  Critical Care performed: None  ____________________________________________   INITIAL IMPRESSION / ASSESSMENT AND PLAN / ED COURSE  Pertinent labs & imaging results that were available during my care of the patient were reviewed by me and considered in my medical decision making (see chart for details).  Patient here for chronic low back pain which was made worse by a fall 10 days ago. She has chronic shortness of breath which is unchanged and she has somewhat elevated blood glucose which she states is because of not taking her medications today. I don't take any of this requires admission to the hospital and counseled her about all of it, including return precautions. Patient does have chronic pain, I will give  her a dose of pain medication here but have advised her that I cannot her home with a prescription as she does have a pain contract from this fall obviously happened a long time ago with no evidence of obvious injury. Patient states she passed out when standing up on new blood pressure medication sometime 10 days ago, and she stopped taking that medication no longer has syncopal symptoms, she will follow up with her doctor for that. Essentially, patient's primary concern is a medication for her back which I have administered some here but obviously cannot prescribe. Extensive return precautions and follow-up given and understood   ____________________________________________   FINAL CLINICAL IMPRESSION(S) / ED DIAGNOSES  Final diagnoses:  SOB (shortness of breath)      This chart was dictated using voice recognition software.  Despite best efforts to proofread,  errors can occur which can change meaning.      Jeanmarie Plant, MD 04/08/17 [phone]

## 2017-04-08 NOTE — ED Notes (Signed)
Pt updated on care plan and need for ride home after diluadid administration. Pt verbalizes understanding.

## 2017-04-08 NOTE — Discharge Instructions (Signed)
Do not drive home tonight. Return to the emergency room for any new or worrisome symptoms. This would include numbness, weakness, shortness of breath, passing out or any other concerns. Follow closely with primary care and pain clinic.

## 2017-04-08 NOTE — ED Notes (Signed)
Pt states she was having increased HTN and dizziness so pmd placed her on losartan in addition to her cardiazem. Pt states on Tuesday she had a syncopal episode and fell back onto her back and buttocks against a television. Pt with dark bruising and yellow bruising noted around low back. Pt states she has also had a cough with yellow sputum production, nasal congestion, dry mouth.

## 2017-04-08 NOTE — ED Triage Notes (Signed)
Pt presents via POV c/o lower back pain and right hip pain s/p fall x1 week ago. Pt states had syncopal episode at that time. Reports ongoing dizziness s/p change in BP medication. Also reports ongoing SOB over several months. Reports taking z pack without relief. Reports hx COPD, on 2L 02 at night. Ambulatory to triage.

## 2017-04-14 DIAGNOSIS — E1165 Type 2 diabetes mellitus with hyperglycemia: Secondary | ICD-10-CM | POA: Insufficient documentation

## 2017-04-17 NOTE — Progress Notes (Signed)
Patient's Name: Erica [name]  MRN: [id number]  Referring Provider: Denton Lank, MD  DOB: May 31, 1966  PCP: Denton Lank, MD  DOS: 04/18/2017  Note by: Gaspar Cola, MD  Service setting: Ambulatory outpatient  Specialty: Interventional Pain Management  Location: [facility] (AMB) Pain Management Facility    Patient type: Established   Primary Reason(s) for Visit: Encounter for post-procedure evaluation of chronic illness with mild to moderate exacerbation CC: Hip Pain (left); Back Pain (lower); and Leg Pain (bilateral)  HPI  Erica [name] is a 51 y.o. year old, female patient, who comes today for a post-procedure evaluation. She has Chest pain; Hyponatremia; Shortness of breath; Swelling; Cough; COPD (chronic obstructive pulmonary disease) with acute bronchitis ([place]); Renal insufficiency; Pulmonary HTN ([place]); COPD, mild ([place]); Chronic diastolic heart failure ([place]); Smoker; Obstructive sleep apnea; Tachycardia; Depression; Long term current use of opiate analgesic; Long term prescription opiate use; Opiate use; Chronic low back pain (Primary Source of Pain) (Bilateral) (L>R); Chronic hip pain (Secondary source of pain) (Bilateral) (L>R); Chronic knee pain [facility] source of pain) (Bilateral) (L>R); Chronic neck pain (Bilateral) (R>L); Chronic upper back pain (midline); Chronic foot pain (bottom of feet) (Bilateral) (R>L); Chronic hand pain (Bilateral); Peripheral neuropathy, idiopathic (upper and lower extremity); Chronic sacroiliac joint pain (Bilateral) (R>L); Lumbar facet syndrome (Bilateral) (R>L); Chronic pain syndrome; Elevated sedimentation rate; Elevated C-reactive protein (CRP); Neurogenic pain; Vitamin D deficiency; Lumbar spondylosis; Osteoarthritis of knee (Bilateral) (L>R); Intermittent left thoracic Muscle cramps; Spasm of thoracolumbar muscle (Left); Post-traumatic osteoarthritis of knee (Left); Acute postoperative pain; Hemarthrosis, left knee; and 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) on her  problem list. Her primarily concern today is the Hip Pain (left); Back Pain (lower); and Leg Pain (bilateral)  Pain Assessment: Location: Lower Back Radiating: left hip and legs Onset: More than a month ago Duration: Chronic pain Quality: Constant, Aching, Sharp, Radiating Severity: 5 /10 (self-reported pain score)  Note: Reported level is inconsistent with clinical observations. Clinically the patient looks like a 3/10       Pain level appears to be "Moderate", defined here as significantly interfering with activities of daily living (ADL). It becomes difficult to feed, bathe, get dressed, get on and off the toilet or to perform personal hygiene functions. Difficult to get in and out of bed or a chair without assistance. Very distracting. With effort, it can be ignored when deeply involved in activities. Timing: Constant Modifying factors: procedures, medications  Erica [name] comes in today for post-procedure evaluation after the treatment done on 03/15/2017.  Further details on both, my assessment(s), as well as the proposed treatment plan, please see below.  Post-Procedure Assessment  03/15/2017 Procedure: Left intra-articular hip joint injection no sedation Pre-procedure pain score:  6/10 Post-procedure pain score: 0/10 (100% relief) Influential Factors: BMI: 30.11 kg/m Intra-procedural challenges: None observed.         Assessment challenges: None detected.              Reported side-effects: None.        Post-procedural adverse reactions or complications: None reported         Sedation: No sedation used. When no sedatives are used, the analgesic levels obtained are directly associated to the effectiveness of the local anesthetics. However, when sedation is provided, the level of analgesia obtained during the initial 1 hour following the intervention, is believed to be the result of a combination of factors. These factors may include, but are not limited to: 1. The effectiveness of  the local anesthetics  used. 2. The effects of the analgesic(s) and/or anxiolytic(s) used. 3. The degree of discomfort experienced by the patient at the time of the procedure. 4. The patients ability and reliability in recalling and recording the events. 5. The presence and influence of possible secondary gains and/or psychosocial factors. Reported result: Relief experienced during the 1st hour after the procedure: 100 % (Ultra-Short Term Relief)            Interpretative annotation: Clinically appropriate result. Analgesia during this period is likely to be Local Anesthetic and/or IV Sedative (Analgesic/Anxiolytic) related.          Effects of local anesthetic: The analgesic effects attained during this period are directly associated to the localized infiltration of local anesthetics and therefore cary significant diagnostic value as to the etiological location, or anatomical origin, of the pain. Expected duration of relief is directly dependent on the pharmacodynamics of the local anesthetic used. Long-acting (4-6 hours) anesthetics used.  Reported result: Relief during the next 4 to 6 hour after the procedure: 100 % (Short-Term Relief)            Interpretative annotation: Clinically appropriate result. Analgesia during this period is likely to be Local Anesthetic-related.          Long-term benefit: Defined as the period of time past the expected duration of local anesthetics (1 hour for short-acting and 4-6 hours for long-acting). With the possible exception of prolonged sympathetic blockade from the local anesthetics, benefits during this period are typically attributed to, or associated with, other factors such as analgesic sensory neuropraxia, antiinflammatory effects, or beneficial biochemical changes provided by agents other than the local anesthetics.  Reported result: Extended relief following procedure: 50 % (Long-Term Relief)            Interpretative annotation: Clinically appropriate  result. Good relief. No permanent benefit expected. Inflammation plays a part in the etiology to the pain.          Current benefits: Defined as reported results that persistent at this point in time.   Analgesia: <50 %            Function: Somewhat improved ROM: Somewhat improved Interpretative annotation: Recurrence of symptoms. No permanent benefit expected. Effective diagnostic intervention.          Interpretation: Results would suggest a successful diagnostic intervention.                  Plan:  Set up procedure as a PRN palliative treatment option for this patient.       Laboratory Chemistry  Inflammation Markers (CRP: Acute Phase) (ESR: Chronic Phase) Lab Results  Component Value Date   CRP 1.3 (H) 04/30/2016   ESRSEDRATE 31 (H) 04/30/2016                 Renal Function Markers Lab Results  Component Value Date   BUN 8 04/08/2017   CREATININE 0.85 04/08/2017   GFRAA >60 04/08/2017   GFRNONAA >60 04/08/2017                 Hepatic Function Markers Lab Results  Component Value Date   AST 27 06/14/2016   ALT 26 06/14/2016   ALBUMIN 4.2 06/14/2016   ALKPHOS 83 06/14/2016                 Electrolytes Lab Results  Component Value Date   NA 132 (L) 04/08/2017   K 3.5 04/08/2017   CL 95 (L) 04/08/2017   CALCIUM  9.3 04/08/2017   MG 1.9 04/30/2016                 Neuropathy Markers Lab Results  Component Value Date   VITAMINB12 248 04/30/2016                 Bone Pathology Markers Lab Results  Component Value Date   ALKPHOS 83 06/14/2016   25OHVITD1 15 (L) 04/30/2016   25OHVITD2 <1.0 04/30/2016   25OHVITD3 15 04/30/2016   CALCIUM 9.3 04/08/2017                 Coagulation Parameters Lab Results  Component Value Date   PLT 280 04/08/2017                 Cardiovascular Markers Lab Results  Component Value Date   BNP 42.0 06/14/2016   HGB 13.4 04/08/2017   HCT 39.9 04/08/2017                 Note: Lab results reviewed.  Recent Diagnostic  Imaging Results  DG Lumbar Spine Complete CLINICAL DATA:  Low back pain after a fall  EXAM: LUMBAR SPINE - COMPLETE 4+ VIEW  COMPARISON:  04/30/2016  FINDINGS: Calcified pelvic phleboliths. Lumbar alignment is within normal limits. Moderate degenerative changes at L5-S1, diffuse degenerative changes elsewhere in the spine with osteophytes and mild disc space narrowing. Vertebral body heights are maintained. Atherosclerotic vascular calcification.  IMPRESSION: Mild to moderate degenerative changes. No acute osseous abnormality.  Electronically Signed   By: Donavan Foil M.D.   On: 04/08/2017 22:36 DG Chest 2 View CLINICAL DATA:  Dizziness since blood pressure medication change. Also shortness of breath several months.  EXAM: CHEST  2 VIEW  COMPARISON:  06/14/2016  FINDINGS: Lungs are adequately inflated without focal consolidation or effusion. Cardiomediastinal silhouette, bones and soft tissues are within normal.  IMPRESSION: No active cardiopulmonary disease.  Electronically Signed   By: Marin Olp M.D.   On: 04/08/2017 19:56  Note: Imaging results reviewed.        Meds   Current Outpatient Prescriptions:  .  albuterol (PROAIR HFA) 108 (90 Base) MCG/ACT inhaler, Inhale into the lungs 4 (four) times daily., Disp: , Rfl:  .  aspirin EC 81 MG EC tablet, Take 1 tablet (81 mg total) by mouth daily., Disp: 30 tablet, Rfl: 0 .  atorvastatin (LIPITOR) 40 MG tablet, Take 40 mg by mouth daily., Disp: , Rfl:  .  Cholecalciferol (VITAMIN D3) 2000 units capsule, Take 1 capsule (2,000 Units total) by mouth daily., Disp: 30 capsule, Rfl: PRN .  diltiazem (CARDIZEM CD) 120 MG 24 hr capsule, Take 1 capsule (120 mg total) by mouth daily., Disp: 30 capsule, Rfl: 11 .  diltiazem (CARDIZEM) 30 MG tablet, Take 1 tablet (30 mg total) by mouth 3 (three) times daily as needed., Disp: 90 tablet, Rfl: 3 .  DULoxetine (CYMBALTA) 60 MG capsule, Take 60 mg by mouth daily., Disp: , Rfl:   .  Fluticasone-Salmeterol (ADVAIR DISKUS IN), Inhale into the lungs 2 (two) times daily., Disp: , Rfl:  .  folic acid (FOLVITE) 1 MG tablet, Take 1 mg by mouth daily., Disp: , Rfl:  .  furosemide (LASIX) 40 MG tablet, Take 40 mg by mouth daily. Take an additional 40 mg in the am for weight gain as needed, Disp: , Rfl:  .  losartan (COZAAR) 100 MG tablet, Take 1 tablet (100 mg total) by mouth daily., Disp: 30 tablet, Rfl: 6 .  metFORMIN (GLUCOPHAGE) 500 MG tablet, Take 500 mg by mouth daily with breakfast., Disp: , Rfl:  .  oxyCODONE (OXY IR/ROXICODONE) 5 MG immediate release tablet, Take 1 tablet (5 mg total) by mouth every 8 (eight) hours as needed for severe pain., Disp: 90 tablet, Rfl: 0 .  OXYGEN, Inhale into the lungs. 2 liters at bedtime, Disp: , Rfl:  .  pantoprazole (PROTONIX) 40 MG tablet, Take 1 tablet (40 mg total) by mouth daily., Disp: 30 tablet, Rfl: 0 .  potassium chloride SA (K-DUR,KLOR-CON) 20 MEQ tablet, Take 20 mEq by mouth 2 (two) times daily., Disp: , Rfl:  .  pregabalin (LYRICA) 75 MG capsule, Take 1 capsule (75 mg total) by mouth 3 (three) times daily., Disp: 270 capsule, Rfl: 0 .  sucralfate (CARAFATE) 1 G tablet, Take 1 tablet (1 g total) by mouth 4 (four) times daily -  with meals and at bedtime., Disp: 120 tablet, Rfl: 0 .  tiotropium (SPIRIVA HANDIHALER) 18 MCG inhalation capsule, Place 18 mcg into inhaler and inhale daily., Disp: , Rfl:  .  tiZANidine (ZANAFLEX) 4 MG capsule, Take 1 capsule (4 mg total) by mouth 3 (three) times daily as needed for muscle spasms., Disp: 90 capsule, Rfl: 2  ROS  Constitutional: Denies any fever or chills Gastrointestinal: No reported hemesis, hematochezia, vomiting, or acute GI distress Musculoskeletal: Denies any acute onset joint swelling, redness, loss of ROM, or weakness Neurological: No reported episodes of acute onset apraxia, aphasia, dysarthria, agnosia, amnesia, paralysis, loss of coordination, or loss of  consciousness  Allergies  Erica [name] is allergic to tramadol and penicillins.  [place]  Drug: Erica [name]  reports that she does not use drugs. Alcohol:  reports that she drinks alcohol. Tobacco:  reports that she quit smoking about 22 months ago. Her smoking use included Cigarettes. She has a 36.00 pack-year smoking history. She has never used smokeless tobacco. Medical:  has a past medical history of Arthritis; Asthma; CHF (congestive heart failure) ([place]); COPD (chronic obstructive pulmonary disease) ([place]); Diabetes mellitus without complication ([place]); Gastric ulcer; Hyperlipidemia; Hypertension; and OSA on CPAP (no ins - no cpap machine for the last 4 years). Surgical: Erica [name]  has a past surgical history that includes Knee surgery (Left) and Flexible bronchoscopy (N/A, 06/20/2015). Family: family history includes Breast cancer in her mother; Diabetes in her maternal grandmother; Lung cancer in her father.  Constitutional Exam  General appearance: Well nourished, well developed, and well hydrated. In no apparent acute distress Vitals:   04/18/17 1203  BP: (!) 142/82  Pulse: (!) 106  Resp: 18  Temp: 98.5 F (36.9 C)  TempSrc: Oral  SpO2: 99%  Weight: 198 lb (89.8 kg)  Height: 5' 8"  (1.727 m)   BMI Assessment: Estimated body mass index is 30.11 kg/m as calculated from the following:   Height as of this encounter: 5' 8"  (1.727 m).   Weight as of this encounter: 198 lb (89.8 kg).  BMI interpretation table: BMI level Category Range association with higher incidence of chronic pain  <18 kg/m2 Underweight   18.5-24.9 kg/m2 Ideal body weight   25-29.9 kg/m2 Overweight Increased incidence by 20%  30-34.9 kg/m2 Obese (Class I) Increased incidence by 68%  35-39.9 kg/m2 Severe obesity (Class II) Increased incidence by 136%  >40 kg/m2 Extreme obesity (Class III) Increased incidence by 254%   BMI Readings from Last 4 Encounters:  04/18/17 30.11 kg/m  04/08/17 30.71 kg/m  03/23/17  31.55 kg/m  03/15/17 30.71 kg/m   Wt  Readings from Last 4 Encounters:  04/18/17 198 lb (89.8 kg)  04/08/17 202 lb (91.6 kg)  03/23/17 207 lb 8 oz (94.1 kg)  03/15/17 202 lb (91.6 kg)  Psych/Mental status: Alert, oriented x 3 (person, place, & time)       Eyes: PERLA Respiratory: No evidence of acute respiratory distress  Cervical Spine Area Exam  Skin & Axial Inspection: No masses, redness, edema, swelling, or associated skin lesions Alignment: Symmetrical Functional ROM: Unrestricted ROM      Stability: No instability detected Muscle Tone/Strength: Functionally intact. No obvious neuro-muscular anomalies detected. Sensory (Neurological): Unimpaired Palpation: No palpable anomalies              Upper Extremity (UE) Exam    Side: Right upper extremity  Side: Left upper extremity  Skin & Extremity Inspection: Skin color, temperature, and hair growth are WNL. No peripheral edema or cyanosis. No masses, redness, swelling, asymmetry, or associated skin lesions. No contractures.  Skin & Extremity Inspection: Skin color, temperature, and hair growth are WNL. No peripheral edema or cyanosis. No masses, redness, swelling, asymmetry, or associated skin lesions. No contractures.  Functional ROM: Unrestricted ROM          Functional ROM: Unrestricted ROM          Muscle Tone/Strength: Functionally intact. No obvious neuro-muscular anomalies detected.  Muscle Tone/Strength: Functionally intact. No obvious neuro-muscular anomalies detected.  Sensory (Neurological): Unimpaired          Sensory (Neurological): Unimpaired          Palpation: No palpable anomalies              Palpation: No palpable anomalies              Specialized Test(s): Deferred         Specialized Test(s): Deferred          Thoracic Spine Area Exam  Skin & Axial Inspection: No masses, redness, or swelling Alignment: Symmetrical Functional ROM: Unrestricted ROM Stability: No instability detected Muscle Tone/Strength:  Functionally intact. No obvious neuro-muscular anomalies detected. Sensory (Neurological): Unimpaired Muscle strength & Tone: No palpable anomalies  Lumbar Spine Area Exam  Skin & Axial Inspection: No masses, redness, or swelling Alignment: Symmetrical Functional ROM: Unrestricted ROM      Stability: No instability detected Muscle Tone/Strength: Functionally intact. No obvious neuro-muscular anomalies detected. Sensory (Neurological): Unimpaired Palpation: No palpable anomalies       Provocative Tests: Lumbar Hyperextension and rotation test: evaluation deferred today       Lumbar Lateral bending test: evaluation deferred today       Patrick's Maneuver: evaluation deferred today                    Gait & Posture Assessment  Ambulation: Unassisted Gait: Relatively normal for age and body habitus Posture: WNL   Lower Extremity Exam    Side: Right lower extremity  Side: Left lower extremity  Skin & Extremity Inspection: Skin color, temperature, and hair growth are WNL. No peripheral edema or cyanosis. No masses, redness, swelling, asymmetry, or associated skin lesions. No contractures.  Skin & Extremity Inspection: Skin color, temperature, and hair growth are WNL. No peripheral edema or cyanosis. No masses, redness, swelling, asymmetry, or associated skin lesions. No contractures.  Functional ROM: Unrestricted ROM          Functional ROM: Unrestricted ROM          Muscle Tone/Strength: Functionally intact. No obvious neuro-muscular anomalies detected.  Muscle Tone/Strength: Functionally intact. No obvious neuro-muscular anomalies detected.  Sensory (Neurological): Unimpaired  Sensory (Neurological): Unimpaired  Palpation: No palpable anomalies  Palpation: No palpable anomalies   Assessment  Primary Diagnosis & Pertinent Problem List: The primary encounter diagnosis was Chronic low back pain (Primary Source of Pain) (Bilateral) (L>R). Diagnoses of Chronic hip pain, unspecified laterality,  Chronic knee pain (Tertiary source of pain) (Bilateral) (L>R), and Chronic pain syndrome were also pertinent to this visit.  Status Diagnosis  Improved Improved Improved 1. Chronic low back pain (Primary Source of Pain) (Bilateral) (L>R)   2. Chronic hip pain, unspecified laterality   3. Chronic knee pain [facility] source of pain) (Bilateral) (L>R)   4. Chronic pain syndrome     Problems updated and reviewed during this visit: No problems updated. Plan of Care  Pharmacotherapy (Medications Ordered): Meds ordered this encounter  Medications  . oxyCODONE (OXY IR/ROXICODONE) 5 MG immediate release tablet    Sig: Take 1 tablet (5 mg total) by mouth every 8 (eight) hours as needed for severe pain.    Dispense:  90 tablet    Refill:  0    Do not place this medication, or any other prescription from our practice, on "Automatic Refill". Patient may have prescription filled one day early if pharmacy is closed on scheduled refill date. Do not fill until: 04/18/17 To last until: 05/18/17   New Prescriptions   OXYCODONE (OXY IR/ROXICODONE) 5 MG IMMEDIATE RELEASE TABLET    Take 1 tablet (5 mg total) by mouth every 8 (eight) hours as needed for severe pain.   Medications administered today: Erica [name] had no medications administered during this visit.  Procedure Orders    No procedure(s) ordered today   Lab Orders  No laboratory test(s) ordered today   Imaging Orders  No imaging studies ordered today   Referral Orders  No referral(s) requested today    Interventional management options: Planned, scheduled, and/or pending:   None at this time.  Today we will discontinue the hydrocodone and start this patient on oxycodone.    Considering:   Diagnostic bilateral lumbar facet block #3 Possible bilateral lumbar facet radiofrequency ablation  Possible bilateral diagnostic sacroiliac joint block  Possible bilateral intra-articular hip joint injection  Possible bilateral  intra-articular knee joint injection Possible bilateral genicular nerve block  Possible bilateral knee joint radiofrequency ablation.  Possible left-sided lumbar epidural steroid injection Possible right-sided cervical epidural steroid injection Possible bilateral diagnostic cervical facet block Possible bilateral diagnostic greater occipital nerve blocks Possible greater occipital nerve radiofrequency ablation.   Palliative PRN treatment(s):   Palliative bilateral lumbar facet radiofrequency ablation    Provider-requested follow-up: Return in about 2 weeks (around 05/02/2017) for Med-Mgmt w/ Dr. Dossie Arbour.  Future Appointments Date Time Provider [place]  05/02/2017 11:30 AM Milinda Pointer, MD [facility] None  05/06/2017 11:20 AM Minna Merritts, MD [id number]  06/22/2017 1:00 PM Alisa Graff, FNP [facility] None   Primary Care Physician: Denton Lank, MD Location: [facility] Note by: Gaspar Cola, MD Date: 04/18/2017; Time: 2:23 PM

## 2017-04-18 ENCOUNTER — Encounter: Payer: Self-pay | Admitting: Pain Medicine

## 2017-04-18 ENCOUNTER — Ambulatory Visit: Payer: Medicaid Other | Attending: Pain Medicine | Admitting: Pain Medicine

## 2017-04-18 ENCOUNTER — Telehealth: Payer: Self-pay | Admitting: *Deleted

## 2017-04-18 VITALS — BP 142/82 | HR 106 | Temp 98.5°F | Resp 18 | Ht 68.0 in | Wt 198.0 lb

## 2017-04-18 DIAGNOSIS — R7982 Elevated C-reactive protein (CRP): Secondary | ICD-10-CM | POA: Insufficient documentation

## 2017-04-18 DIAGNOSIS — M542 Cervicalgia: Secondary | ICD-10-CM | POA: Insufficient documentation

## 2017-04-18 DIAGNOSIS — N289 Disorder of kidney and ureter, unspecified: Secondary | ICD-10-CM | POA: Diagnosis not present

## 2017-04-18 DIAGNOSIS — M25561 Pain in right knee: Secondary | ICD-10-CM | POA: Insufficient documentation

## 2017-04-18 DIAGNOSIS — M25552 Pain in left hip: Secondary | ICD-10-CM | POA: Insufficient documentation

## 2017-04-18 DIAGNOSIS — M79641 Pain in right hand: Secondary | ICD-10-CM | POA: Insufficient documentation

## 2017-04-18 DIAGNOSIS — G4733 Obstructive sleep apnea (adult) (pediatric): Secondary | ICD-10-CM | POA: Diagnosis not present

## 2017-04-18 DIAGNOSIS — G8929 Other chronic pain: Secondary | ICD-10-CM

## 2017-04-18 DIAGNOSIS — M79604 Pain in right leg: Secondary | ICD-10-CM | POA: Diagnosis not present

## 2017-04-18 DIAGNOSIS — M25562 Pain in left knee: Secondary | ICD-10-CM | POA: Insufficient documentation

## 2017-04-18 DIAGNOSIS — E871 Hypo-osmolality and hyponatremia: Secondary | ICD-10-CM | POA: Diagnosis not present

## 2017-04-18 DIAGNOSIS — Z880 Allergy status to penicillin: Secondary | ICD-10-CM | POA: Insufficient documentation

## 2017-04-18 DIAGNOSIS — R000 Tachycardia, unspecified: Secondary | ICD-10-CM | POA: Insufficient documentation

## 2017-04-18 DIAGNOSIS — G894 Chronic pain syndrome: Secondary | ICD-10-CM | POA: Insufficient documentation

## 2017-04-18 DIAGNOSIS — M25559 Pain in unspecified hip: Secondary | ICD-10-CM | POA: Diagnosis not present

## 2017-04-18 DIAGNOSIS — M79605 Pain in left leg: Secondary | ICD-10-CM | POA: Diagnosis present

## 2017-04-18 DIAGNOSIS — M545 Low back pain, unspecified: Secondary | ICD-10-CM

## 2017-04-18 DIAGNOSIS — G8918 Other acute postprocedural pain: Secondary | ICD-10-CM | POA: Diagnosis not present

## 2017-04-18 DIAGNOSIS — M79672 Pain in left foot: Secondary | ICD-10-CM | POA: Insufficient documentation

## 2017-04-18 DIAGNOSIS — Z7984 Long term (current) use of oral hypoglycemic drugs: Secondary | ICD-10-CM | POA: Insufficient documentation

## 2017-04-18 DIAGNOSIS — M170 Bilateral primary osteoarthritis of knee: Secondary | ICD-10-CM | POA: Insufficient documentation

## 2017-04-18 DIAGNOSIS — I272 Pulmonary hypertension, unspecified: Secondary | ICD-10-CM | POA: Diagnosis not present

## 2017-04-18 DIAGNOSIS — R05 Cough: Secondary | ICD-10-CM | POA: Insufficient documentation

## 2017-04-18 DIAGNOSIS — M47816 Spondylosis without myelopathy or radiculopathy, lumbar region: Secondary | ICD-10-CM | POA: Insufficient documentation

## 2017-04-18 DIAGNOSIS — J449 Chronic obstructive pulmonary disease, unspecified: Secondary | ICD-10-CM | POA: Insufficient documentation

## 2017-04-18 DIAGNOSIS — M533 Sacrococcygeal disorders, not elsewhere classified: Secondary | ICD-10-CM | POA: Insufficient documentation

## 2017-04-18 DIAGNOSIS — M79671 Pain in right foot: Secondary | ICD-10-CM | POA: Insufficient documentation

## 2017-04-18 DIAGNOSIS — J440 Chronic obstructive pulmonary disease with acute lower respiratory infection: Secondary | ICD-10-CM | POA: Diagnosis not present

## 2017-04-18 DIAGNOSIS — F17210 Nicotine dependence, cigarettes, uncomplicated: Secondary | ICD-10-CM | POA: Diagnosis not present

## 2017-04-18 DIAGNOSIS — E559 Vitamin D deficiency, unspecified: Secondary | ICD-10-CM | POA: Diagnosis not present

## 2017-04-18 DIAGNOSIS — J209 Acute bronchitis, unspecified: Secondary | ICD-10-CM | POA: Insufficient documentation

## 2017-04-18 DIAGNOSIS — E1140 Type 2 diabetes mellitus with diabetic neuropathy, unspecified: Secondary | ICD-10-CM | POA: Insufficient documentation

## 2017-04-18 DIAGNOSIS — Z79891 Long term (current) use of opiate analgesic: Secondary | ICD-10-CM | POA: Insufficient documentation

## 2017-04-18 DIAGNOSIS — M25551 Pain in right hip: Secondary | ICD-10-CM | POA: Insufficient documentation

## 2017-04-18 DIAGNOSIS — I5032 Chronic diastolic (congestive) heart failure: Secondary | ICD-10-CM | POA: Insufficient documentation

## 2017-04-18 DIAGNOSIS — Z7982 Long term (current) use of aspirin: Secondary | ICD-10-CM | POA: Insufficient documentation

## 2017-04-18 DIAGNOSIS — F329 Major depressive disorder, single episode, unspecified: Secondary | ICD-10-CM | POA: Diagnosis not present

## 2017-04-18 DIAGNOSIS — M1610 Unilateral primary osteoarthritis, unspecified hip: Secondary | ICD-10-CM | POA: Diagnosis not present

## 2017-04-18 DIAGNOSIS — M25062 Hemarthrosis, left knee: Secondary | ICD-10-CM | POA: Diagnosis not present

## 2017-04-18 DIAGNOSIS — M79642 Pain in left hand: Secondary | ICD-10-CM | POA: Insufficient documentation

## 2017-04-18 DIAGNOSIS — R42 Dizziness and giddiness: Secondary | ICD-10-CM | POA: Insufficient documentation

## 2017-04-18 DIAGNOSIS — R0602 Shortness of breath: Secondary | ICD-10-CM | POA: Insufficient documentation

## 2017-04-18 MED ORDER — OXYCODONE HCL 5 MG PO TABS: 5.0000 mg | ORAL_TABLET | Freq: Three times a day (TID) | ORAL | 0 refills | Status: DC | PRN

## 2017-04-18 NOTE — Patient Instructions (Addendum)
____________________________________________________________________________________________  Medication Rules  Applies to: All patients receiving prescriptions (written or electronic).  Pharmacy of record: Pharmacy where electronic prescriptions will be sent. If written prescriptions are taken to a different pharmacy, please inform the nursing staff. The pharmacy listed in the electronic medical record should be the one where you would like electronic prescriptions to be sent.  Prescription refills: Only during scheduled appointments. Applies to both, written and electronic prescriptions.  NOTE: The following applies primarily to controlled substances (Opioid* Pain Medications).   Patient's responsibilities: 1. Pain Pills: Bring all pain pills to every appointment (except for procedure appointments). 2. Pill Bottles: Bring pills in original pharmacy bottle. Always bring newest bottle. Bring bottle, even if empty. 3. Medication refills: You are responsible for knowing and keeping track of what medications you need refilled. The day before your appointment, write a list of all prescriptions that need to be refilled. Bring that list to your appointment and give it to the admitting nurse. Prescriptions will be written only during appointments. If you forget a medication, it will not be "Called in", "Faxed", or "electronically sent". You will need to get another appointment to get these prescribed. 4. Prescription Accuracy: You are responsible for carefully inspecting your prescriptions before leaving our office. Have the discharge nurse carefully go over each prescription with you, before taking them home. Make sure that your name is accurately spelled, that your address is correct. Check the name and dose of your medication to make sure it is accurate. Check the number of pills, and the written instructions to make sure they are clear and accurate. Make sure that you are given enough medication to  last until your next medication refill appointment. 5. Taking Medication: Take medication as prescribed. Never take more pills than instructed. Never take medication more frequently than prescribed. Taking less pills or less frequently is permitted and encouraged, when it comes to controlled substances (written prescriptions).  6. Inform other Doctors: Always inform, all of your healthcare providers, of all the medications you take. 7. Pain Medication from other Providers: You are not allowed to accept any additional pain medication from any other Doctor or Healthcare provider. There are two exceptions to this rule. (see below) In the event that you require additional pain medication, you are responsible for notifying us, as stated below. 8. Medication Agreement: You are responsible for carefully reading and following our Medication Agreement. This must be signed before receiving any prescriptions from our practice. Safely store a copy of your signed Agreement. Violations to the Agreement will result in no further prescriptions. (Additional copies of our Medication Agreement are available upon request.) 9. Laws, Rules, & Regulations: All patients are expected to follow all Federal and State Laws, Statutes, Rules, & Regulations. Ignorance of the Laws does not constitute a valid excuse. The use of any illegal substances is prohibited. 10. Adopted CDC guidelines & recommendations: Target dosing levels will be at or below 60 MME/day. Use of benzodiazepines** is not recommended.  Exceptions: There are only two exceptions to the rule of not receiving pain medications from other Healthcare Providers. 1. Exception #1 (Emergencies): In the event of an emergency (i.e.: accident requiring emergency care), you are allowed to receive additional pain medication. However, you are responsible for: As soon as you are able, call our office [phone], at any time of the day or night, and leave a message stating your  name, the date and nature of the emergency, and the name and dose of the medication   prescribed. In the event that your call is answered by a member of our staff, make sure to document and save the date, time, and the name of the person that took your information.  2. Exception #2 (Planned Surgery): In the event that you are scheduled by another doctor or dentist to have any type of surgery or procedure, you are allowed (for a period no longer than 30 days), to receive additional pain medication, for the acute post-op pain. However, in this case, you are responsible for picking up a copy of our "Post-op Pain Management for Surgeons" handout, and giving it to your surgeon or dentist. This document is available at our office, and does not require an appointment to obtain it. Simply go to our office during business hours (Monday-Thursday from 8:00 AM to 4:00 PM) (Friday 8:00 AM to 12:00 Noon) or if you have a scheduled appointment with us, prior to your surgery, and ask for it by name. In addition, you will need to provide us with your name, name of your surgeon, type of surgery, and date of procedure or surgery.  *Opioid medications include: morphine, codeine, oxycodone, oxymorphone, hydrocodone, hydromorphone, meperidine, tramadol, tapentadol, buprenorphine, fentanyl, methadone. **Benzodiazepine medications include: diazepam (Valium), alprazolam (Xanax), clonazepam (Klonopine), lorazepam (Ativan), clorazepate (Tranxene), chlordiazepoxide (Librium), estazolam (Prosom), oxazepam (Serax), temazepam (Restoril), triazolam (Halcion)  You were given one prescription for Oxycodone today. ____________________________________________________________________________________________   

## 2017-04-18 NOTE — Progress Notes (Signed)
Nursing Pain Medication Assessment:  Safety precautions to be maintained throughout the outpatient stay will include: orient to surroundings, keep bed in low position, maintain call bell within reach at all times, provide assistance with transfer out of bed and ambulation.  Medication Inspection Compliance: Pill count conducted under aseptic conditions, in front of the patient. Neither the pills nor the bottle was removed from the patient's sight at any time. Once count was completed pills were immediately returned to the patient in their original bottle.  Medication: Hydrocodone/APAP Pill/Patch Count:  0 of 60 pills remain Pill/Patch Appearance: Markings consistent with prescribed medication Bottle Appearance: Standard pharmacy container. Clearly labeled. Filled Date: 09 / 05 / 2018 Last Medication intake:  Day before yesterday

## 2017-04-19 NOTE — Telephone Encounter (Signed)
PA sent for Percocet on 04/18/17 LP

## 2017-04-19 NOTE — Telephone Encounter (Signed)
Spoke with patient, Percocet needs PA.

## 2017-05-01 NOTE — Progress Notes (Signed)
Patient's Name: Erica [name]  MRN: [id number]  Referring Provider: Denton Lank, MD  DOB: [birth year]  PCP: Denton Lank, MD  DOS: 05/02/2017  Note by: Gaspar Cola, MD  Service setting: Ambulatory outpatient  Specialty: Interventional Pain Management  Location: [facility] (AMB) Pain Management Facility    Patient type: Established   Primary Reason(s) for Visit: Encounter for prescription drug management. (Level of risk: moderate)  CC: Hip Pain (left) and Back Pain (low)  HPI  [name] is a 51 y.o. year old, female patient, who comes today for a medication management evaluation. She has Chest pain; Hyponatremia; Shortness of breath; Swelling; Cough; COPD (chronic obstructive pulmonary disease) with acute bronchitis ([place]); Renal insufficiency; Pulmonary HTN ([place]); COPD, mild ([place]); Chronic diastolic heart failure ([place]); Smoker; Obstructive sleep apnea; Tachycardia; Depression; Long term current use of opiate analgesic; Long term prescription opiate use; Opiate use; Chronic low back pain (Primary Source of Pain) (Bilateral) (L>R); Chronic knee pain [facility] source of pain) (Bilateral) (L>R); Chronic neck pain (Bilateral) (R>L); Chronic upper back pain (midline); Chronic foot pain (bottom of feet) (Bilateral) (R>L); Chronic hand pain (Bilateral); Peripheral neuropathy, idiopathic (upper and lower extremity); Chronic sacroiliac joint pain (Bilateral) (R>L); Lumbar facet syndrome (Bilateral) (R>L); Chronic pain syndrome; Elevated sedimentation rate; Elevated C-reactive protein (CRP); Neurogenic pain; Vitamin D deficiency; Lumbar spondylosis; Osteoarthritis of knee (Bilateral) (L>R); Intermittent left thoracic Muscle cramps; Spasm of thoracolumbar muscle (Left); Post-traumatic osteoarthritis of knee (Left); Acute postoperative pain; Hemarthrosis, left knee; 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); and Chronic hip pain (Secondary source of pain) (Bilateral) (L>R) on her problem list. Her primarily concern  today is the Hip Pain (left) and Back Pain (low)  Pain Assessment: Location: Left Hip Onset: More than a month ago Duration: Chronic pain Quality: Aching, Sharp, Radiating, Constant Severity: 2 /10 (self-reported pain score)  Note: Reported level is compatible with observation.                         Timing: Constant Modifying factors: procedure  [name] was last scheduled for an appointment on 04/18/2017 for medication management. During today's appointment we reviewed [name]'s chronic pain status, as well as her outpatient medication regimen. Had previous bilateral knee injection with steroid, with good results, unfortunately now she has diabetes. We'll move to bilateral hyalgan. Knee MRI denied.  The patient  reports that she does not use drugs. Her body mass index is 30.41 kg/m.  Further details on both, my assessment(s), as well as the proposed treatment plan, please see below.  Controlled Substance Pharmacotherapy Assessment REMS (Risk Evaluation and Mitigation Strategy)  Analgesic: Oxycodone IR 5 mg 1 tablet by mouth every 8 hours (15 mg/day of oxycodone) (22.5 MME/day) MME/day: 22.5 mg/day.  Dewayne Shorter, RN  05/02/2017 12:12 PM  Signed Nursing Pain Medication Assessment:  Safety precautions to be maintained throughout the outpatient stay will include: orient to surroundings, keep bed in low position, maintain call bell within reach at all times, provide assistance with transfer out of bed and ambulation.  Medication Inspection Compliance: Pill count conducted under aseptic conditions, in front of the patient. Neither the pills nor the bottle was removed from the patient's sight at any time. Once count was completed pills were immediately returned to the patient in their original bottle.  Medication #1: Hydrocodone/APAP (These were filled by mistake, by the pharmacy when the hospital and went to pick up the prescriptions. The patient brought them back and we have destroyed  them in front of the patient.)  Pill/Patch Count: 60 of 60 pills remain Pill/Patch Appearance: Markings consistent with prescribed medication Bottle Appearance: Standard pharmacy container. Clearly labeled. Filled Date:10 / 05 / 2018 Last Medication intake:  has not taken any since filled.   Medication #2: Oxycodone IR Pill/Patch Count: 50 of 90 pills remain Pill/Patch Appearance: Markings consistent with prescribed medication Bottle Appearance: Standard pharmacy container. Clearly labeled. Filled Date: 10 /01 / 2018 Last Medication intake:  Today   Pharmacokinetics: Liberation and absorption (onset of action): WNL Distribution (time to peak effect): WNL Metabolism and excretion (duration of action): WNL         Pharmacodynamics: Desired effects: Analgesia: [name] reports >50% benefit. Functional ability: Patient reports that medication allows her to accomplish basic ADLs Clinically meaningful improvement in function (CMIF): Sustained CMIF goals met Perceived effectiveness: Described as relatively effective, allowing for increase in activities of daily living (ADL) Undesirable effects: Side-effects or Adverse reactions: None reported Monitoring: [place] PMP: Online review of the past 57-monthperiod conducted. Compliant with practice rules and regulations Last UDS on record: Summary  Date Value Ref Range Status  02/21/2017 FINAL  Final    Comment:    ==================================================================== TOXASSURE SELECT 13 (MW) ==================================================================== Test                             Result       Flag       Units Drug Absent but Declared for Prescription Verification   Hydrocodone                    Not Detected UNEXPECTED ng/mg creat   Oxycodone                      Not Detected UNEXPECTED ng/mg creat ==================================================================== Test                      Result    Flag   Units       Ref Range   Creatinine              111              mg/dL      >=20 ==================================================================== Declared Medications:  The flagging and interpretation on this report are based on the  following declared medications.  Unexpected results may arise from  inaccuracies in the declared medications.  **Note: The testing scope of this panel includes these medications:  Hydrocodone (Hydrocodone-Acetaminophen)  Oxycodone (Oxycodone Acetaminophen)  **Note: The testing scope of this panel does not include following  reported medications:  Acetaminophen (Hydrocodone-Acetaminophen)  Acetaminophen (Oxycodone Acetaminophen)  Albuterol  Aspirin  Atorvastatin  Diltiazem (Cardizem CD)  Duloxetine  Fluticasone  Folic acid  Furosemide  Oxygen  Pantoprazole (Protonix)  Potassium  Pregabalin  Salmeterol  Sucralfate  Tiotropium (Spiriva)  Tizanidine (Zanaflex)  Vitamin D3 ==================================================================== For clinical consultation, please call ([phone] ====================================================================    UDS interpretation: Compliant          Medication Assessment Form: Reviewed. Patient indicates being compliant with therapy Treatment compliance: Compliant Risk Assessment Profile: Aberrant behavior: See prior evaluations. None observed or detected today Comorbid factors increasing risk of overdose: See prior notes. No additional risks detected today Risk of substance use disorder (SUD): Low     Opioid Risk Tool - 05/02/17 1212      Family History of Substance Abuse  Alcohol Negative   Illegal Drugs Negative   Rx Drugs Negative     Personal History of Substance Abuse   Alcohol Negative   Illegal Drugs Negative   Rx Drugs Negative     Age   Age between 26-45 years  No     History of Preadolescent Sexual Abuse   History of Preadolescent Sexual Abuse Negative or Female      Psychological Disease   Psychological Disease Negative   Depression Positive     Total Score   Opioid Risk Tool Scoring 1   Opioid Risk Interpretation Low Risk     ORT Scoring interpretation table:  Score <3 = Low Risk for SUD  Score between 4-7 = Moderate Risk for SUD  Score >8 = High Risk for Opioid Abuse   Risk Mitigation Strategies:  Patient Counseling: Covered Patient-Prescriber Agreement (PPA): Present and active  Notification to other healthcare providers: Done  Pharmacologic Plan: No change in therapy, at this time  Laboratory Chemistry  Inflammation Markers (CRP: Acute Phase) (ESR: Chronic Phase) Lab Results  Component Value Date   CRP 1.3 (H) 04/30/2016   ESRSEDRATE 31 (H) 04/30/2016                 Renal Function Markers Lab Results  Component Value Date   BUN 8 04/08/2017   CREATININE 0.85 04/08/2017   GFRAA >60 04/08/2017   GFRNONAA >60 04/08/2017                 Hepatic Function Markers Lab Results  Component Value Date   AST 27 06/14/2016   ALT 26 06/14/2016   ALBUMIN 4.2 06/14/2016   ALKPHOS 83 06/14/2016                 Electrolytes Lab Results  Component Value Date   NA 132 (L) 04/08/2017   K 3.5 04/08/2017   CL 95 (L) 04/08/2017   CALCIUM 9.3 04/08/2017   MG 1.9 04/30/2016                 Neuropathy Markers Lab Results  Component Value Date   VITAMINB12 248 04/30/2016                 Bone Pathology Markers Lab Results  Component Value Date   ALKPHOS 83 06/14/2016   25OHVITD1 15 (L) 04/30/2016   25OHVITD2 <1.0 04/30/2016   25OHVITD3 15 04/30/2016   CALCIUM 9.3 04/08/2017                 Coagulation Parameters Lab Results  Component Value Date   PLT 280 04/08/2017                 Cardiovascular Markers Lab Results  Component Value Date   BNP 42.0 06/14/2016   HGB 13.4 04/08/2017   HCT 39.9 04/08/2017                 Note: Lab results reviewed.  Recent Diagnostic Imaging Results  DG Lumbar Spine  Complete CLINICAL DATA:  Low back pain after a fall  EXAM: LUMBAR SPINE - COMPLETE 4+ VIEW  COMPARISON:  04/30/2016  FINDINGS: Calcified pelvic phleboliths. Lumbar alignment is within normal limits. Moderate degenerative changes at L5-S1, diffuse degenerative changes elsewhere in the spine with osteophytes and mild disc space narrowing. Vertebral body heights are maintained. Atherosclerotic vascular calcification.  IMPRESSION: Mild to moderate degenerative changes. No acute osseous abnormality.  Electronically Signed   By: Madie Reno.D.  On: 04/08/2017 22:36 DG Chest 2 View CLINICAL DATA:  Dizziness since blood pressure medication change. Also shortness of breath several months.  EXAM: CHEST  2 VIEW  COMPARISON:  06/14/2016  FINDINGS: Lungs are adequately inflated without focal consolidation or effusion. Cardiomediastinal silhouette, bones and soft tissues are within normal.  IMPRESSION: No active cardiopulmonary disease.  Electronically Signed   By: Marin Olp M.D.   On: 04/08/2017 19:56  Complexity Note: Imaging results reviewed. Results shared with Ms. Melina Copa, using [employer].                         Meds   Current Outpatient Prescriptions:  .  albuterol (PROAIR HFA) 108 (90 Base) MCG/ACT inhaler, Inhale into the lungs 4 (four) times daily., Disp: , Rfl:  .  aspirin EC 81 MG EC tablet, Take 1 tablet (81 mg total) by mouth daily., Disp: 30 tablet, Rfl: 0 .  atorvastatin (LIPITOR) 20 MG tablet, Take by mouth., Disp: , Rfl:  .  atorvastatin (LIPITOR) 40 MG tablet, Take 40 mg by mouth daily., Disp: , Rfl:  .  Cholecalciferol (VITAMIN D3) 2000 units capsule, Take 1 capsule (2,000 Units total) by mouth daily., Disp: 30 capsule, Rfl: PRN .  diltiazem (CARDIZEM CD) 120 MG 24 hr capsule, Take 1 capsule (120 mg total) by mouth daily., Disp: 30 capsule, Rfl: 11 .  diltiazem (CARDIZEM) 30 MG tablet, Take 1 tablet (30 mg total) by mouth 3 (three) times daily  as needed., Disp: 90 tablet, Rfl: 3 .  DULoxetine (CYMBALTA) 30 MG capsule, Take 30 mg by mouth., Disp: , Rfl:  .  DULoxetine (CYMBALTA) 60 MG capsule, Take 60 mg by mouth daily., Disp: , Rfl:  .  Fluticasone-Salmeterol (ADVAIR DISKUS IN), Inhale into the lungs 2 (two) times daily., Disp: , Rfl:  .  folic acid (FOLVITE) 1 MG tablet, Take 1 mg by mouth daily., Disp: , Rfl:  .  furosemide (LASIX) 40 MG tablet, Take 40 mg by mouth daily. Take an additional 40 mg in the am for weight gain as needed, Disp: , Rfl:  .  losartan (COZAAR) 100 MG tablet, Take 1 tablet (100 mg total) by mouth daily., Disp: 30 tablet, Rfl: 6 .  Magnesium Oxide, Antacid, 500 MG CAPS, once daily., Disp: , Rfl:  .  metFORMIN (GLUCOPHAGE) 500 MG tablet, Take 500 mg by mouth daily with breakfast., Disp: , Rfl:  .  [START ON 05/18/2017] oxyCODONE (OXY IR/ROXICODONE) 5 MG immediate release tablet, Take 1 tablet (5 mg total) by mouth every 8 (eight) hours as needed for severe pain., Disp: 90 tablet, Rfl: 0 .  [START ON 06/17/2017] oxyCODONE (OXY IR/ROXICODONE) 5 MG immediate release tablet, Take 1 tablet (5 mg total) by mouth every 8 (eight) hours as needed for severe pain., Disp: 90 tablet, Rfl: 0 .  [START ON 07/17/2017] oxyCODONE (OXY IR/ROXICODONE) 5 MG immediate release tablet, Take 1 tablet (5 mg total) by mouth every 8 (eight) hours as needed for severe pain., Disp: 90 tablet, Rfl: 0 .  OXYGEN, Inhale into the lungs. 2 liters at bedtime, Disp: , Rfl:  .  pantoprazole (PROTONIX) 40 MG tablet, Take 1 tablet (40 mg total) by mouth daily., Disp: 30 tablet, Rfl: 0 .  potassium chloride SA (K-DUR,KLOR-CON) 20 MEQ tablet, Take 20 mEq by mouth 2 (two) times daily., Disp: , Rfl:  .  [START ON 05/22/2017] pregabalin (LYRICA) 75 MG capsule, Take 1 capsule (75 mg total) by mouth  3 (three) times daily., Disp: 258 capsule, Rfl: 0 .  sucralfate (CARAFATE) 1 G tablet, Take 1 tablet (1 g total) by mouth 4 (four) times daily -  with meals and at  bedtime., Disp: 120 tablet, Rfl: 0 .  tiotropium (SPIRIVA HANDIHALER) 18 MCG inhalation capsule, Place 18 mcg into inhaler and inhale daily., Disp: , Rfl:  .  [START ON 05/22/2017] tiZANidine (ZANAFLEX) 4 MG capsule, Take 1 capsule (4 mg total) by mouth 3 (three) times daily as needed for muscle spasms., Disp: 90 capsule, Rfl: 2  ROS  Constitutional: Denies any fever or chills Gastrointestinal: No reported hemesis, hematochezia, vomiting, or acute GI distress Musculoskeletal: Denies any acute onset joint swelling, redness, loss of ROM, or weakness Neurological: No reported episodes of acute onset apraxia, aphasia, dysarthria, agnosia, amnesia, paralysis, loss of coordination, or loss of consciousness  Allergies  Ms. Koepke is allergic to tramadol and penicillins.  [place]  Drug: Ms. Janice  reports that she does not use drugs. Alcohol:  reports that she drinks alcohol. Tobacco:  reports that she quit smoking about 22 months ago. Her smoking use included Cigarettes. She has a 36.00 pack-year smoking history. She has never used smokeless tobacco. Medical:  has a past medical history of Arthritis; Asthma; CHF (congestive heart failure) ([place]); COPD (chronic obstructive pulmonary disease) ([place]); Diabetes mellitus without complication ([place]); Gastric ulcer; Hyperlipidemia; Hypertension; and OSA on CPAP (no ins - no cpap machine for the last 4 years). Surgical: Ms. Menzie  has a past surgical history that includes Knee surgery (Left) and Flexible bronchoscopy (N/A, 06/20/2015). Family: family history includes Breast cancer in her mother; Diabetes in her maternal grandmother; Lung cancer in her father.  Constitutional Exam  General appearance: Well nourished, well developed, and well hydrated. In no apparent acute distress Vitals:   05/02/17 1159 05/02/17 1200  BP:  (!) 141/67  Pulse:  86  Resp:  16  Temp:  98 F (36.7 C)  SpO2:  99%  Weight: 200 lb (90.7 kg)   Height: 5' 8"  (1.727 m)    BMI  Assessment: Estimated body mass index is 30.41 kg/m as calculated from the following:   Height as of this encounter: 5' 8"  (1.727 m).   Weight as of this encounter: 200 lb (90.7 kg).  BMI interpretation table: BMI level Category Range association with higher incidence of chronic pain  <18 kg/m2 Underweight   18.5-24.9 kg/m2 Ideal body weight   25-29.9 kg/m2 Overweight Increased incidence by 20%  30-34.9 kg/m2 Obese (Class I) Increased incidence by 68%  35-39.9 kg/m2 Severe obesity (Class II) Increased incidence by 136%  >40 kg/m2 Extreme obesity (Class III) Increased incidence by 254%   BMI Readings from Last 4 Encounters:  05/02/17 30.41 kg/m  04/18/17 30.11 kg/m  04/08/17 30.71 kg/m  03/23/17 31.55 kg/m   Wt Readings from Last 4 Encounters:  05/02/17 200 lb (90.7 kg)  04/18/17 198 lb (89.8 kg)  04/08/17 202 lb (91.6 kg)  03/23/17 207 lb 8 oz (94.1 kg)  Psych/Mental status: Alert, oriented x 3 (person, place, & time)       Eyes: PERLA Respiratory: No evidence of acute respiratory distress  Cervical Spine Area Exam  Skin & Axial Inspection: No masses, redness, edema, swelling, or associated skin lesions Alignment: Symmetrical Functional ROM: Unrestricted ROM      Stability: No instability detected Muscle Tone/Strength: Functionally intact. No obvious neuro-muscular anomalies detected. Sensory (Neurological): Unimpaired Palpation: No palpable anomalies  Upper Extremity (UE) Exam    Side: Right upper extremity  Side: Left upper extremity  Skin & Extremity Inspection: Skin color, temperature, and hair growth are WNL. No peripheral edema or cyanosis. No masses, redness, swelling, asymmetry, or associated skin lesions. No contractures.  Skin & Extremity Inspection: Skin color, temperature, and hair growth are WNL. No peripheral edema or cyanosis. No masses, redness, swelling, asymmetry, or associated skin lesions. No contractures.  Functional ROM: Unrestricted ROM           Functional ROM: Unrestricted ROM          Muscle Tone/Strength: Functionally intact. No obvious neuro-muscular anomalies detected.  Muscle Tone/Strength: Functionally intact. No obvious neuro-muscular anomalies detected.  Sensory (Neurological): Unimpaired          Sensory (Neurological): Unimpaired          Palpation: No palpable anomalies              Palpation: No palpable anomalies              Specialized Test(s): Deferred         Specialized Test(s): Deferred          Thoracic Spine Area Exam  Skin & Axial Inspection: No masses, redness, or swelling Alignment: Symmetrical Functional ROM: Unrestricted ROM Stability: No instability detected Muscle Tone/Strength: Functionally intact. No obvious neuro-muscular anomalies detected. Sensory (Neurological): Unimpaired Muscle strength & Tone: No palpable anomalies  Lumbar Spine Area Exam  Skin & Axial Inspection: No masses, redness, or swelling Alignment: Symmetrical Functional ROM: Unrestricted ROM      Stability: No instability detected Muscle Tone/Strength: Functionally intact. No obvious neuro-muscular anomalies detected. Sensory (Neurological): Unimpaired Palpation: No palpable anomalies       Provocative Tests: Lumbar Hyperextension and rotation test: evaluation deferred today       Lumbar Lateral bending test: evaluation deferred today       Patrick's Maneuver: evaluation deferred today                    Gait & Posture Assessment  Ambulation: Unassisted Gait: Relatively normal for age and body habitus Posture: WNL   Lower Extremity Exam    Side: Right lower extremity  Side: Left lower extremity  Skin & Extremity Inspection: Skin color, temperature, and hair growth are WNL. No peripheral edema or cyanosis. No masses, redness, swelling, asymmetry, or associated skin lesions. No contractures.  Skin & Extremity Inspection: Skin color, temperature, and hair growth are WNL. No peripheral edema or cyanosis. No masses,  redness, swelling, asymmetry, or associated skin lesions. No contractures.  Functional ROM: Decreased ROM for knee joint  Functional ROM: Decreased ROM for knee joint  Muscle Tone/Strength: Functionally intact. No obvious neuro-muscular anomalies detected.  Muscle Tone/Strength: Functionally intact. No obvious neuro-muscular anomalies detected.  Sensory (Neurological): Arthropathic arthralgia  Sensory (Neurological): Arthropathic arthralgia  Palpation: No palpable anomalies  Palpation: No palpable anomalies   Assessment  Primary Diagnosis & Pertinent Problem List: The primary encounter diagnosis was Chronic knee pain [facility] source of pain) (Bilateral) (L>R). Diagnoses of Osteoarthritis of knee (Bilateral) (L>R), Chronic hip pain (Secondary source of pain) (Bilateral) (L>R), Intermittent left thoracic Muscle cramps, Spasm of thoracolumbar muscle (Left), Post-traumatic osteoarthritis of knee (Left), Peripheral neuropathy, idiopathic (upper and lower extremity), Chronic pain syndrome, Neurogenic pain, Long term current use of opiate analgesic, Long term prescription opiate use, and Opiate use were also pertinent to this visit.  Status Diagnosis  Worsening Persistent Improved 1. Chronic knee pain [facility]  source of pain) (Bilateral) (L>R)   2. Osteoarthritis of knee (Bilateral) (L>R)   3. Chronic hip pain (Secondary source of pain) (Bilateral) (L>R)   4. Intermittent left thoracic Muscle cramps   5. Spasm of thoracolumbar muscle (Left)   6. Post-traumatic osteoarthritis of knee (Left)   7. Peripheral neuropathy, idiopathic (upper and lower extremity)   8. Chronic pain syndrome   9. Neurogenic pain   10. Long term current use of opiate analgesic   11. Long term prescription opiate use   12. Opiate use     Problems updated and reviewed during this visit: Problem  Chronic hip pain (Secondary source of pain) (Bilateral) (L>R)   Plan of Care  Pharmacotherapy (Medications Ordered): Meds  ordered this encounter  Medications  . oxyCODONE (OXY IR/ROXICODONE) 5 MG immediate release tablet    Sig: Take 1 tablet (5 mg total) by mouth every 8 (eight) hours as needed for severe pain.    Dispense:  90 tablet    Refill:  0    Do not place this medication, or any other prescription from our practice, on "Automatic Refill". Patient may have prescription filled one day early if pharmacy is closed on scheduled refill date. Do not fill until: 05/18/17 To last until: 06/17/17  . oxyCODONE (OXY IR/ROXICODONE) 5 MG immediate release tablet    Sig: Take 1 tablet (5 mg total) by mouth every 8 (eight) hours as needed for severe pain.    Dispense:  90 tablet    Refill:  0    Do not place this medication, or any other prescription from our practice, on "Automatic Refill". Patient may have prescription filled one day early if pharmacy is closed on scheduled refill date. Do not fill until: 06/17/17 To last until: 07/17/17  . oxyCODONE (OXY IR/ROXICODONE) 5 MG immediate release tablet    Sig: Take 1 tablet (5 mg total) by mouth every 8 (eight) hours as needed for severe pain.    Dispense:  90 tablet    Refill:  0    Do not place this medication, or any other prescription from our practice, on "Automatic Refill". Patient may have prescription filled one day early if pharmacy is closed on scheduled refill date. Do not fill until: 07/17/17 To last until: 08/16/17  . tiZANidine (ZANAFLEX) 4 MG capsule    Sig: Take 1 capsule (4 mg total) by mouth 3 (three) times daily as needed for muscle spasms.    Dispense:  90 capsule    Refill:  2    Do not place this medication, or any other prescription from our practice, on "Automatic Refill". Patient may have prescription filled one day early if pharmacy is closed on scheduled refill date.  . pregabalin (LYRICA) 75 MG capsule    Sig: Take 1 capsule (75 mg total) by mouth 3 (three) times daily.    Dispense:  258 capsule    Refill:  0    Do not add this  medication to the electronic "Automatic Refill" notification system. Patient may have prescription filled one day early if pharmacy is closed on scheduled refill date.   New Prescriptions   No medications on file   Medications administered today: Ms. Peplinski had no medications administered during this visit.   Procedure Orders     KNEE INJECTION  Lab Orders     ToxASSURE Select 13 (MW), Urine Imaging Orders  No imaging studies ordered today   Referral Orders  No referral(s) requested today  Interventional management options: Planned, scheduled, and/or pending:   Therapeutic bilateral intra-articular Hyalgan knee injection #1, ASAA.   Considering:   Diagnostic bilateral lumbar facet block #3 Possible bilateral lumbar facet radiofrequency ablation  Possible bilateral diagnostic sacroiliac joint block  Possible bilateral intra-articular hip joint injection  Possible bilateral intra-articular knee joint injection Possible bilateral genicular nerve block  Possible bilateral knee joint radiofrequency ablation.  Possible left-sided lumbar epidural steroid injection Possible right-sided cervical epidural steroid injection Possible bilateral diagnostic cervical facet block Possible bilateral diagnostic greater occipital nerve blocks Possible greater occipital nerve radiofrequency ablation.   Palliative PRN treatment(s):   Palliative bilateral lumbar facet radiofrequency ablation    Provider-requested follow-up: Return for Procedure (no sedation): (B) IA Hyalgan Knee Inj..  Future Appointments Date Time Provider [place]  05/06/2017 11:20 AM Minna Merritts, MD [facility]  05/10/2017 9:30 AM Milinda Pointer, MD [facility] None  06/22/2017 1:00 PM Alisa Graff, FNP [facility] None  07/27/2017 10:30 AM Vevelyn Francois, NP [facility] None   Primary Care Physician: Denton Lank, MD Location: [facility] Note by: Gaspar Cola, MD Date: 05/02/2017; Time: 4:14 PM

## 2017-05-02 ENCOUNTER — Encounter: Payer: Self-pay | Admitting: Pain Medicine

## 2017-05-02 ENCOUNTER — Ambulatory Visit: Payer: Medicaid Other | Attending: Pain Medicine | Admitting: Pain Medicine

## 2017-05-02 VITALS — BP 141/67 | HR 86 | Temp 98.0°F | Resp 16 | Ht 68.0 in | Wt 200.0 lb

## 2017-05-02 DIAGNOSIS — E559 Vitamin D deficiency, unspecified: Secondary | ICD-10-CM | POA: Insufficient documentation

## 2017-05-02 DIAGNOSIS — G609 Hereditary and idiopathic neuropathy, unspecified: Secondary | ICD-10-CM

## 2017-05-02 DIAGNOSIS — G4733 Obstructive sleep apnea (adult) (pediatric): Secondary | ICD-10-CM | POA: Diagnosis not present

## 2017-05-02 DIAGNOSIS — Z79899 Other long term (current) drug therapy: Secondary | ICD-10-CM | POA: Diagnosis not present

## 2017-05-02 DIAGNOSIS — G8929 Other chronic pain: Secondary | ICD-10-CM | POA: Insufficient documentation

## 2017-05-02 DIAGNOSIS — I272 Pulmonary hypertension, unspecified: Secondary | ICD-10-CM | POA: Insufficient documentation

## 2017-05-02 DIAGNOSIS — M1732 Unilateral post-traumatic osteoarthritis, left knee: Secondary | ICD-10-CM | POA: Diagnosis not present

## 2017-05-02 DIAGNOSIS — Z7984 Long term (current) use of oral hypoglycemic drugs: Secondary | ICD-10-CM | POA: Insufficient documentation

## 2017-05-02 DIAGNOSIS — M25561 Pain in right knee: Secondary | ICD-10-CM | POA: Diagnosis not present

## 2017-05-02 DIAGNOSIS — M542 Cervicalgia: Secondary | ICD-10-CM | POA: Insufficient documentation

## 2017-05-02 DIAGNOSIS — G894 Chronic pain syndrome: Secondary | ICD-10-CM

## 2017-05-02 DIAGNOSIS — Z87891 Personal history of nicotine dependence: Secondary | ICD-10-CM | POA: Diagnosis not present

## 2017-05-02 DIAGNOSIS — M170 Bilateral primary osteoarthritis of knee: Secondary | ICD-10-CM | POA: Diagnosis not present

## 2017-05-02 DIAGNOSIS — M25552 Pain in left hip: Secondary | ICD-10-CM | POA: Insufficient documentation

## 2017-05-02 DIAGNOSIS — E1140 Type 2 diabetes mellitus with diabetic neuropathy, unspecified: Secondary | ICD-10-CM | POA: Insufficient documentation

## 2017-05-02 DIAGNOSIS — M1711 Unilateral primary osteoarthritis, right knee: Secondary | ICD-10-CM | POA: Diagnosis not present

## 2017-05-02 DIAGNOSIS — M792 Neuralgia and neuritis, unspecified: Secondary | ICD-10-CM

## 2017-05-02 DIAGNOSIS — Z803 Family history of malignant neoplasm of breast: Secondary | ICD-10-CM | POA: Diagnosis not present

## 2017-05-02 DIAGNOSIS — J449 Chronic obstructive pulmonary disease, unspecified: Secondary | ICD-10-CM | POA: Insufficient documentation

## 2017-05-02 DIAGNOSIS — M25551 Pain in right hip: Secondary | ICD-10-CM | POA: Diagnosis not present

## 2017-05-02 DIAGNOSIS — Z888 Allergy status to other drugs, medicaments and biological substances status: Secondary | ICD-10-CM | POA: Diagnosis not present

## 2017-05-02 DIAGNOSIS — M1990 Unspecified osteoarthritis, unspecified site: Secondary | ICD-10-CM | POA: Diagnosis not present

## 2017-05-02 DIAGNOSIS — E785 Hyperlipidemia, unspecified: Secondary | ICD-10-CM | POA: Diagnosis not present

## 2017-05-02 DIAGNOSIS — R000 Tachycardia, unspecified: Secondary | ICD-10-CM | POA: Insufficient documentation

## 2017-05-02 DIAGNOSIS — Z8711 Personal history of peptic ulcer disease: Secondary | ICD-10-CM | POA: Insufficient documentation

## 2017-05-02 DIAGNOSIS — Z880 Allergy status to penicillin: Secondary | ICD-10-CM | POA: Insufficient documentation

## 2017-05-02 DIAGNOSIS — R7982 Elevated C-reactive protein (CRP): Secondary | ICD-10-CM | POA: Insufficient documentation

## 2017-05-02 DIAGNOSIS — Z801 Family history of malignant neoplasm of trachea, bronchus and lung: Secondary | ICD-10-CM | POA: Diagnosis not present

## 2017-05-02 DIAGNOSIS — M62830 Muscle spasm of back: Secondary | ICD-10-CM

## 2017-05-02 DIAGNOSIS — M25562 Pain in left knee: Secondary | ICD-10-CM | POA: Insufficient documentation

## 2017-05-02 DIAGNOSIS — Z7982 Long term (current) use of aspirin: Secondary | ICD-10-CM | POA: Diagnosis not present

## 2017-05-02 DIAGNOSIS — R252 Cramp and spasm: Secondary | ICD-10-CM | POA: Diagnosis not present

## 2017-05-02 DIAGNOSIS — I110 Hypertensive heart disease with heart failure: Secondary | ICD-10-CM | POA: Diagnosis not present

## 2017-05-02 DIAGNOSIS — N289 Disorder of kidney and ureter, unspecified: Secondary | ICD-10-CM | POA: Insufficient documentation

## 2017-05-02 DIAGNOSIS — F1190 Opioid use, unspecified, uncomplicated: Secondary | ICD-10-CM

## 2017-05-02 DIAGNOSIS — Z79891 Long term (current) use of opiate analgesic: Secondary | ICD-10-CM | POA: Diagnosis not present

## 2017-05-02 DIAGNOSIS — M533 Sacrococcygeal disorders, not elsewhere classified: Secondary | ICD-10-CM | POA: Insufficient documentation

## 2017-05-02 DIAGNOSIS — F329 Major depressive disorder, single episode, unspecified: Secondary | ICD-10-CM | POA: Insufficient documentation

## 2017-05-02 DIAGNOSIS — E871 Hypo-osmolality and hyponatremia: Secondary | ICD-10-CM | POA: Insufficient documentation

## 2017-05-02 DIAGNOSIS — I5032 Chronic diastolic (congestive) heart failure: Secondary | ICD-10-CM | POA: Insufficient documentation

## 2017-05-02 DIAGNOSIS — Z5181 Encounter for therapeutic drug level monitoring: Secondary | ICD-10-CM | POA: Insufficient documentation

## 2017-05-02 DIAGNOSIS — J209 Acute bronchitis, unspecified: Secondary | ICD-10-CM | POA: Insufficient documentation

## 2017-05-02 DIAGNOSIS — R700 Elevated erythrocyte sedimentation rate: Secondary | ICD-10-CM | POA: Insufficient documentation

## 2017-05-02 MED ORDER — OXYCODONE HCL 5 MG PO TABS: 5.0000 mg | ORAL_TABLET | Freq: Three times a day (TID) | ORAL | 0 refills | Status: DC | PRN

## 2017-05-02 MED ORDER — TIZANIDINE HCL 4 MG PO CAPS: 4.0000 mg | ORAL_CAPSULE | Freq: Three times a day (TID) | ORAL | 2 refills | Status: DC | PRN

## 2017-05-02 MED ORDER — PREGABALIN 75 MG PO CAPS: 75.0000 mg | ORAL_CAPSULE | Freq: Three times a day (TID) | ORAL | 0 refills | Status: DC

## 2017-05-02 NOTE — Progress Notes (Signed)
Nursing Pain Medication Assessment:  Safety precautions to be maintained throughout the outpatient stay will include: orient to surroundings, keep bed in low position, maintain call bell within reach at all times, provide assistance with transfer out of bed and ambulation.  Medication Inspection Compliance: Pill count conducted under aseptic conditions, in front of the patient. Neither the pills nor the bottle was removed from the patient's sight at any time. Once count was completed pills were immediately returned to the patient in their original bottle.  Medication #1: Hydrocodone/APAP Pill/Patch Count: 60 of 60 pills remain Pill/Patch Appearance: Markings consistent with prescribed medication Bottle Appearance: Standard pharmacy container. Clearly labeled. Filled Date:10 / 05 / 2018 Last Medication intake:  has not taken any since filled.   Medication #2: Oxycodone IR Pill/Patch Count: 50 of 90 pills remain Pill/Patch Appearance: Markings consistent with prescribed medication Bottle Appearance: Standard pharmacy container. Clearly labeled. Filled Date: 10 /01 / 2018 Last Medication intake:  Today

## 2017-05-02 NOTE — Patient Instructions (Addendum)
____________________________________________________________________________________________  Preparing for your procedure (without sedation) Instructions: . Oral Intake: Do not eat or drink anything for at least 3 hours prior to your procedure. . Transportation: Unless otherwise stated by your physician, you may drive yourself after the procedure. . Blood Pressure Medicine: Take your blood pressure medicine with a sip of water the morning of the procedure. . Blood thinners:  . Diabetics on insulin: Notify the staff so that you can be scheduled 1st case in the morning. If your diabetes requires high dose insulin, take only  of your normal insulin dose the morning of the procedure and notify the staff that you have done so. . Preventing infections: Shower with an antibacterial soap the morning of your procedure.  . Build-up your immune system: Take 1000 mg of Vitamin C with every meal (3 times a day) the day prior to your procedure. . Antibiotics: Inform the staff if you have a condition or reason that requires you to take antibiotics before dental procedures. . Pregnancy: If you are pregnant, call and cancel the procedure. . Sickness: If you have a cold, fever, or any active infections, call and cancel the procedure. . Arrival: You must be in the facility at least 30 minutes prior to your scheduled procedure. . Children: Do not bring any children with you. . Dress appropriately: Bring dark clothing that you would not mind if they get stained. . Valuables: Do not bring any jewelry or valuables. Procedure appointments are reserved for interventional treatments only. . No Prescription Refills. . No medication changes will be discussed during procedure appointments. . No disability issues will be discussed. ____________________________________________________________________________________________  Pain Management Discharge Instructions  General Discharge Instructions :  If you need to  reach your doctor call: Monday-Friday 8:00 am - 4:00 pm at [phone] or toll free [phone].  After clinic hours [phone] to have operator reach doctor.  Bring all of your medication bottles to all your appointments in the pain clinic.  To cancel or reschedule your appointment with Pain Management please remember to call 24 hours in advance to avoid a fee.  Refer to the educational materials which you have been given on: General Risks, I had my Procedure. Discharge Instructions, Post Sedation.  Post Procedure Instructions:  The drugs you were given will stay in your system until tomorrow, so for the next 24 hours you should not drive, make any legal decisions or drink any alcoholic beverages.  You may eat anything you prefer, but it is better to start with liquids then soups and crackers, and gradually work up to solid foods.  Please notify your doctor immediately if you have any unusual bleeding, trouble breathing or pain that is not related to your normal pain.  Depending on the type of procedure that was done, some parts of your body may feel week and/or numb.  This usually clears up by tonight or the next day.  Walk with the use of an assistive device or accompanied by an adult for the 24 hours.  You may use ice on the affected area for the first 24 hours.  Put ice in a Ziploc bag and cover with a towel and place against area 15 minutes on 15 minutes off.  You may switch to heat after 24 hours. Knee Injection A knee injection is a procedure to get medicine into your knee joint. Your health care provider puts a needle into the joint and injects medicine with an attached syringe. The injected medicine may relieve the pain, swelling,   and stiffness of arthritis. The injected medicine may also help to lubricate and cushion your knee joint. You may need more than one injection. Tell a health care provider about:  Any allergies you have.  All medicines you are taking, including  vitamins, herbs, eye drops, creams, and over-the-counter medicines.  Any problems you or family members have had with anesthetic medicines.  Any blood disorders you have.  Any surgeries you have had.  Any medical conditions you have. What are the risks? Generally, this is a safe procedure. However, problems may occur, including:  Infection.  Bleeding.  Worsening symptoms.  Damage to the area around your knee.  Allergic reaction to any of the medicines.  Skin reactions from repeated injections.  What happens before the procedure?  Ask your health care provider about changing or stopping your regular medicines. This is especially important if you are taking diabetes medicines or blood thinners.  Plan to have someone take you home after the procedure. What happens during the procedure?  You will sit or lie down in a position for your knee to be treated.  The skin over your kneecap will be cleaned with a germ-killing solution (antiseptic).  You will be given a medicine that numbs the area (local anesthetic). You may feel some stinging.  After your knee becomes numb, you will have a second injection. This is the medicine. This needle is carefully placed between your kneecap and your knee. The medicine is injected into the joint space.  At the end of the procedure, the needle will be removed.  A bandage (dressing) may be placed over the injection site. The procedure may vary among health care providers and hospitals. What happens after the procedure?  You may have to move your knee through its full range of motion. This helps to get all of the medicine into your joint space.  Your blood pressure, heart rate, breathing rate, and blood oxygen level will be monitored often until the medicines you were given have worn off.  You will be watched to make sure that you do not have a reaction to the injected medicine. This information is not intended to replace advice given to you  by your health care provider. Make sure you discuss any questions you have with your health care provider. Document Released: 09/26/2006 Document Revised: 12/05/2015 Document Reviewed: 05/15/2014 Elsevier Interactive Patient Education  2018 [employer]vier Inc[employer]  

## 2017-05-05 NOTE — Progress Notes (Deleted)
Cardiology Office Note  Date:  05/05/2017   ID:  Erica [name], DOB [birth year], MRN [id number]  PCP:  Hillery AldoPatel, Sarah, MD   No chief complaint on file.   HPI:   51 y.o.femalewith h/o  smoking, COPD, on inhalers, quit 04/2016 medication noncompliance secondary to financial and insurance issues,  chronic thrush,  chronic back pain   hospital  November 2016 with shortness of breath, pneumonia,  echocardiogram showing normal LV function, EF 60%,  elevated right heart pressures,   sodium around 112, abnormal troponin, long hospital course CT 06/2015, No significant CAD, PAD, minimal in aortic arch She presents today for follow-up of her chronic diastolic CHF  In follow-up today she reports that she is doing well May have broken left foot small toe, hit a recliner with her foot by accident  Leg swelling has essentially resolved Taking lasix 40 daily Sometimes extra lasix 20 in the PM She's moderating her fluid intake  Lab work reviewed from primary care showing  Stable normal renal function, sodium 140s, potassium 4.1  Reports that she quit smoking October 2017 Occasionally breaks down has cigarette  EKG personally reviewed by myself on todays visit Shows normal sinus rhythm with rate 90 bpm no significant ST or T-wave changes  Other past medical history reviewed weight gain, worsening shortness of breath Seen in the ER 06/14/16 with shortness of breath symptoms, given IV Lasix  Previously had several Prednisone tapers, most recently in jan 2018,  OSA, does not use CPAP. Had problems with anxiety  Continues to take Advair, spiriva and proair Problems with thrush, takes Diflucan 150 mg 1 as needed   CT scan chest  Oct 2017  Previous CT 06/2015 No significant CAD, PAD, minimal in aortic arch Images reviewed with her in detail   Recent EKG reviewed from 08/31/2016 when she saw CHF clinic showing normal sinus rhythm rate 66 bpm no significant ST or T-wave  changes  PMH:   has a past medical history of Arthritis; Asthma; CHF (congestive heart failure) (HCC); COPD (chronic obstructive pulmonary disease) (HCC); Diabetes mellitus without complication (HCC); Gastric ulcer; Hyperlipidemia; Hypertension; and OSA on CPAP (no ins - no cpap machine for the last 4 years).  PSH:    Past Surgical History:  Procedure Laterality Date  . FLEXIBLE BRONCHOSCOPY N/A 06/20/2015   Procedure: FLEXIBLE BRONCHOSCOPY;  Surgeon: Shane CrutchPradeep Ramachandran, MD;  Location: [facility] ORS;  Service: Pulmonary;  Laterality: N/A;  . KNEE SURGERY Left    8 knee surgeries    Current Outpatient Prescriptions  Medication Sig Dispense Refill  . albuterol (PROAIR HFA) 108 (90 Base) MCG/ACT inhaler Inhale into the lungs 4 (four) times daily.    Marland Kitchen. aspirin EC 81 MG EC tablet Take 1 tablet (81 mg total) by mouth daily. 30 tablet 0  . atorvastatin (LIPITOR) 20 MG tablet Take by mouth.    Marland Kitchen. atorvastatin (LIPITOR) 40 MG tablet Take 40 mg by mouth daily.    . Cholecalciferol (VITAMIN D3) 2000 units capsule Take 1 capsule (2,000 Units total) by mouth daily. 30 capsule PRN  . diltiazem (CARDIZEM CD) 120 MG 24 hr capsule Take 1 capsule (120 mg total) by mouth daily. 30 capsule 11  . diltiazem (CARDIZEM) 30 MG tablet Take 1 tablet (30 mg total) by mouth 3 (three) times daily as needed. 90 tablet 3  . DULoxetine (CYMBALTA) 30 MG capsule Take 30 mg by mouth.    . DULoxetine (CYMBALTA) 60 MG capsule Take 60 mg by mouth daily.    .Marland Kitchen  Fluticasone-Salmeterol (ADVAIR DISKUS IN) Inhale into the lungs 2 (two) times daily.    . folic acid (FOLVITE) 1 MG tablet Take 1 mg by mouth daily.    . furosemide (LASIX) 40 MG tablet Take 40 mg by mouth daily. Take an additional 40 mg in the am for weight gain as needed    . losartan (COZAAR) 100 MG tablet Take 1 tablet (100 mg total) by mouth daily. 30 tablet 6  . Magnesium Oxide, Antacid, 500 MG CAPS once daily.    . metFORMIN (GLUCOPHAGE) 500 MG tablet Take 500 mg by  mouth daily with breakfast.    . [START ON 05/18/2017] oxyCODONE (OXY IR/ROXICODONE) 5 MG immediate release tablet Take 1 tablet (5 mg total) by mouth every 8 (eight) hours as needed for severe pain. 90 tablet 0  . [START ON 06/17/2017] oxyCODONE (OXY IR/ROXICODONE) 5 MG immediate release tablet Take 1 tablet (5 mg total) by mouth every 8 (eight) hours as needed for severe pain. 90 tablet 0  . [START ON 07/17/2017] oxyCODONE (OXY IR/ROXICODONE) 5 MG immediate release tablet Take 1 tablet (5 mg total) by mouth every 8 (eight) hours as needed for severe pain. 90 tablet 0  . OXYGEN Inhale into the lungs. 2 liters at bedtime    . pantoprazole (PROTONIX) 40 MG tablet Take 1 tablet (40 mg total) by mouth daily. 30 tablet 0  . potassium chloride SA (K-DUR,KLOR-CON) 20 MEQ tablet Take 20 mEq by mouth 2 (two) times daily.    Melene Muller ON 05/22/2017] pregabalin (LYRICA) 75 MG capsule Take 1 capsule (75 mg total) by mouth 3 (three) times daily. 258 capsule 0  . sucralfate (CARAFATE) 1 G tablet Take 1 tablet (1 g total) by mouth 4 (four) times daily -  with meals and at bedtime. 120 tablet 0  . tiotropium (SPIRIVA HANDIHALER) 18 MCG inhalation capsule Place 18 mcg into inhaler and inhale daily.    Melene Muller ON 05/22/2017] tiZANidine (ZANAFLEX) 4 MG capsule Take 1 capsule (4 mg total) by mouth 3 (three) times daily as needed for muscle spasms. 90 capsule 2   No current facility-administered medications for this visit.      Allergies:   Tramadol and Penicillins   Social History:  The patient  reports that she quit smoking about 22 months ago. Her smoking use included Cigarettes. She has a 36.00 pack-year smoking history. She has never used smokeless tobacco. She reports that she drinks alcohol. She reports that she does not use drugs.   Family History:   family history includes Breast cancer in her mother; Diabetes in her maternal grandmother; Lung cancer in her father.    Review of Systems: Review of Systems   Constitutional: Negative.        Weight gain  HENT:       Thrush  Respiratory: Negative.   Cardiovascular: Negative.   Gastrointestinal: Negative.        Abdominal distention  Musculoskeletal: Negative.        Toe pain  Neurological: Negative.   Psychiatric/Behavioral: Negative.   All other systems reviewed and are negative.    PHYSICAL EXAM: VS:  LMP 03/13/2015 (Approximate) Comment: states she is going through menopause , BMI There is no height or weight on file to calculate BMI.  GEN: Well nourished, well developed, in no acute distress, obese  HEENT: normal  Neck: no JVD, carotid bruits, or masses Cardiac: RRR; no murmurs, rubs, or gallops,no edema  Respiratory:  Mildly decreased breath sounds  throughout, normal work of breathing GI: soft, nontender, nondistended, + BS MS: no deformity or atrophy , Significant bruising left foot last 3 toes  Skin: warm and dry, no rash Neuro:  Strength and sensation are intact Psych: euthymic mood, full affect    Recent Labs: 06/14/2016: ALT 26; B Natriuretic Peptide 42.0 04/08/2017: BUN 8; Creatinine, Ser 0.85; Hemoglobin 13.4; Platelets 280; Potassium 3.5; Sodium 132    Lipid Panel Lab Results  Component Value Date   CHOL 177 06/14/2015   HDL 25 (L) 06/14/2015   LDLCALC 113 (H) 06/14/2015   TRIG 193 (H) 06/14/2015      Wt Readings from Last 3 Encounters:  05/02/17 200 lb (90.7 kg)  04/18/17 198 lb (89.8 kg)  04/08/17 202 lb (91.6 kg)       ASSESSMENT AND PLAN:   Chronic diastolic heart failure (HCC) Weight relatively stable Taking Lasix 40 mg daily, extra Lasix after lunch 20-40 for any ankle swelling Appears euvolemic today  Smoker Reports that she stopped smoking October 2017  Palpitations Denies having significant palpitations No further workup at this time  Simple chronic bronchitis (HCC)Prior history ofc bronchitis  Pulmonary HTN Prior echocardiogram November 2016 with moderately elevated right  heart pressures Appears relatively euvolemic, breathing improved  Shortness of breath Much improved on today's visit  previously felt to be multifactorial :  severe COPD, obesity, deconditioning, diastolic CHF/pulmonary hypertension  COPD (chronic obstructive pulmonary disease) with acute bronchitis (HCC) Currently uses  steroid inhalers, albuterol Diflucan prescribed for thrush  Obstructive sleep apnea Unable to tolerate CPAP, was anxious  Morbid obesity (HCC) We have encouraged careful diet management in an effort to lose weight.   Total encounter time more than 45 minutes  Greater than 50% was spent in counseling and coordination of care with the patient   Disposition:   F/U  6 months  No orders of the defined types were placed in this encounter.    Signed, Dossie Arbour, M.D., Ph.D. 05/05/2017  [facility] [place], [place] [phone]

## 2017-05-06 ENCOUNTER — Ambulatory Visit: Payer: Medicaid Other | Admitting: Cardiovascular Disease

## 2017-05-07 LAB — TOXASSURE SELECT 13 (MW), URINE

## 2017-05-09 ENCOUNTER — Encounter: Payer: Self-pay | Admitting: Cardiovascular Disease

## 2017-05-10 ENCOUNTER — Encounter: Payer: Self-pay | Admitting: Pain Medicine

## 2017-05-10 ENCOUNTER — Ambulatory Visit: Payer: Medicaid Other | Attending: Pain Medicine | Admitting: Pain Medicine

## 2017-05-10 VITALS — BP 124/56 | HR 93 | Temp 98.7°F | Resp 16 | Ht 68.0 in | Wt 200.0 lb

## 2017-05-10 DIAGNOSIS — M25562 Pain in left knee: Secondary | ICD-10-CM | POA: Insufficient documentation

## 2017-05-10 DIAGNOSIS — M25561 Pain in right knee: Secondary | ICD-10-CM | POA: Insufficient documentation

## 2017-05-10 DIAGNOSIS — G8929 Other chronic pain: Secondary | ICD-10-CM

## 2017-05-10 DIAGNOSIS — M170 Bilateral primary osteoarthritis of knee: Secondary | ICD-10-CM | POA: Diagnosis not present

## 2017-05-10 DIAGNOSIS — M1732 Unilateral post-traumatic osteoarthritis, left knee: Secondary | ICD-10-CM | POA: Diagnosis not present

## 2017-05-10 MED ORDER — LIDOCAINE HCL (PF) 1 % IJ SOLN
5.0000 mL | Freq: Once | INTRAMUSCULAR | Status: AC
  Administered 2017-05-10: 5 mL

## 2017-05-10 MED ORDER — ROPIVACAINE HCL 2 MG/ML IJ SOLN
2.0000 mL | Freq: Once | INTRAMUSCULAR | Status: AC
  Administered 2017-05-10: 10 mL via INTRA_ARTICULAR

## 2017-05-10 MED ORDER — ROPIVACAINE HCL 2 MG/ML IJ SOLN
INTRAMUSCULAR | Status: AC
  Filled 2017-05-10: qty 10

## 2017-05-10 MED ORDER — SODIUM HYALURONATE (VISCOSUP) 20 MG/2ML IX SOSY
2.0000 mL | PREFILLED_SYRINGE | Freq: Once | INTRA_ARTICULAR | Status: AC
  Administered 2017-05-10: 2 mL via INTRA_ARTICULAR
  Filled 2017-05-10: qty 2

## 2017-05-10 MED ORDER — LIDOCAINE HCL (PF) 1 % IJ SOLN
INTRAMUSCULAR | Status: AC
  Filled 2017-05-10: qty 5

## 2017-05-10 NOTE — Progress Notes (Signed)
Patient's Name: Erica [name]  MRN: [id number]  Referring Provider: Hillery Aldo, MD  DOB: 12/28/1965  PCP: Hillery Aldo, MD  DOS: 05/10/2017  Note by: Oswaldo Done, MD  Service setting: Ambulatory outpatient  Specialty: Interventional Pain Management  Patient type: Established  Location: [facility] (AMB) Pain Management Facility  Visit type: Interventional Procedure   Primary Reason for Visit: Interventional Pain Management Treatment. CC: Knee Pain (bilateral)  Procedure:  Anesthesia, Analgesia, Anxiolysis:  Type: Therapeutic Intra-Articular Hyalgan Knee Injection #1 Region: Lateral infrapatellar Knee Region Level: Knee Joint Laterality: Bilateral  Type: Local Anesthesia Local Anesthetic: Lidocaine 1% Route: Infiltration (/IM) IV Access: Declined Sedation: Declined  Indication(s): Analgesia           Indications: 1. Chronic knee pain [facility] source of pain) (Bilateral) (L>R)   2. Osteoarthritis of knee (Bilateral) (L>R)   3. Post-traumatic osteoarthritis of knee (Left)    Pain Score: Pre-procedure: 2 /10 Post-procedure: 0-No pain/10  Pre-op Assessment:  Erica [name] is a 51 y.o. (year old), female patient, seen today for interventional treatment. She  has a past surgical history that includes Knee surgery (Left) and Flexible bronchoscopy (N/A, 06/20/2015). Erica [name] has a current medication list which includes the following prescription(s): albuterol, aspirin, atorvastatin, atorvastatin, vitamin d3, diltiazem, diltiazem, duloxetine, duloxetine, fluticasone-salmeterol, folic acid, furosemide, losartan, magnesium oxide (antacid), metformin, oxycodone, oxycodone, oxycodone, oxygen-helium, pantoprazole, potassium chloride sa, pregabalin, sucralfate, tiotropium, and tizanidine. Her primarily concern today is the Knee Pain (bilateral)  Initial Vital Signs: Last menstrual period 03/13/2015. BMI: Estimated body mass index is 30.41 kg/m as calculated from the following:   Height as of  this encounter: 5\' 8"  (1.727 m).   Weight as of this encounter: 200 lb (90.7 kg).  Risk Assessment: Allergies: Reviewed. She is allergic to tramadol and penicillins.  Allergy Precautions: None required Coagulopathies: Reviewed. None identified.  Blood-thinner therapy: None at this time Active Infection(s): Reviewed. None identified. Erica [name] is afebrile  Site Confirmation: Erica [name] was asked to confirm the procedure and laterality before marking the site Procedure checklist: Completed Consent: Before the procedure and under the influence of no sedative(s), amnesic(s), or anxiolytics, the patient was informed of the treatment options, risks and possible complications. To fulfill our ethical and legal obligations, as recommended by the American Medical Association's Code of Ethics, I have informed the patient of my clinical impression; the nature and purpose of the treatment or procedure; the risks, benefits, and possible complications of the intervention; the alternatives, including doing nothing; the risk(s) and benefit(s) of the alternative treatment(s) or procedure(s); and the risk(s) and benefit(s) of doing nothing. The patient was provided information about the general risks and possible complications associated with the procedure. These may include, but are not limited to: failure to achieve desired goals, infection, bleeding, organ or nerve damage, allergic reactions, paralysis, and death. In addition, the patient was informed of those risks and complications associated to the procedure, such as failure to decrease pain; infection; bleeding; organ or nerve damage with subsequent damage to sensory, motor, and/or autonomic systems, resulting in permanent pain, numbness, and/or weakness of one or several areas of the body; allergic reactions; (i.e.: anaphylactic reaction); and/or death. Furthermore, the patient was informed of those risks and complications associated with the medications. These  include, but are not limited to: allergic reactions (i.e.: anaphylactic or anaphylactoid reaction(s)); adrenal axis suppression; blood sugar elevation that in diabetics may result in ketoacidosis or comma; water retention that in patients with history of congestive heart failure may  result in shortness of breath, pulmonary edema, and decompensation with resultant heart failure; weight gain; swelling or edema; medication-induced neural toxicity; particulate matter embolism and blood vessel occlusion with resultant organ, and/or nervous system infarction; and/or aseptic necrosis of one or more joints. Finally, the patient was informed that Medicine is not an exact science; therefore, there is also the possibility of unforeseen or unpredictable risks and/or possible complications that may result in a catastrophic outcome. The patient indicated having understood very clearly. We have given the patient no guarantees and we have made no promises. Enough time was given to the patient to ask questions, all of which were answered to the patient's satisfaction. Erica [name] has indicated that she wanted to continue with the procedure. Attestation: I, the ordering provider, attest that I have discussed with the patient the benefits, risks, side-effects, alternatives, likelihood of achieving goals, and potential problems during recovery for the procedure that I have provided informed consent. Date: 05/10/2017; Time: 7:38 AM  Pre-Procedure Preparation:  Monitoring: As per clinic protocol. Respiration, ETCO2, SpO2, BP, heart rate and rhythm monitor placed and checked for adequate function Safety Precautions: Patient was assessed for positional comfort and pressure points before starting the procedure. Time-out: I initiated and conducted the "Time-out" before starting the procedure, as per protocol. The patient was asked to participate by confirming the accuracy of the "Time Out" information. Verification of the correct  person, site, and procedure were performed and confirmed by me, the nursing staff, and the patient. "Time-out" conducted as per Joint Commission's Universal Protocol (UP.01.01.01). "Time-out" Date & Time: 05/10/2017; 0957 hrs.  Description of Procedure Process:   Position: Sitting Target Area: Knee Joint Approach: Just above the Lateral tibial plateau, lateral to the infrapatellar tendon. Area Prepped: Entire knee area, from the mid-thigh to the mid-shin. Prepping solution: ChloraPrep (2% chlorhexidine gluconate and 70% isopropyl alcohol) Safety Precautions: Aspiration looking for blood return was conducted prior to all injections. At no point did we inject any substances, as a needle was being advanced. No attempts were made at seeking any paresthesias. Safe injection practices and needle disposal techniques used. Medications properly checked for expiration dates. SDV (single dose vial) medications used. Description of the Procedure: Protocol guidelines were followed. The patient was placed in position over the fluoroscopy table. The target area was identified and the area prepped in the usual manner. Skin desensitized using vapocoolant spray. Skin & deeper tissues infiltrated with local anesthetic. Appropriate amount of time allowed to pass for local anesthetics to take effect. The procedure needles were then advanced to the target area. Proper needle placement secured. Negative aspiration confirmed. Solution injected in intermittent fashion, asking for systemic symptoms every 0.5cc of injectate. The needles were then removed and the area cleansed, making sure to leave some of the prepping solution back to take advantage of its long term bactericidal properties. Note: I was able to aspirate approximately 8 cc of clear fluid out of the left knee.  Vitals:   05/10/17 0941 05/10/17 1007  BP: 120/73 (!) 124/56  Pulse: 91 93  Resp: 18 16  Temp: 98.7 F (37.1 C)   TempSrc: Oral   SpO2: 96% 97%   Weight: 200 lb (90.7 kg)   Height: 5\' 8"  (1.727 m)     Start Time: 0957 hrs. End Time: 1006 hrs. Materials:  Needle(s) Type: Regular needle Gauge: 25G Length: 1.5-in Medication(s): We administered lidocaine (PF), Sodium Hyaluronate, Sodium Hyaluronate, and ropivacaine (PF) 2 mg/mL (0.2%). Please see chart orders for dosing details.  Imaging Guidance:  Type of Imaging Technique: None used Indication(s): N/A Exposure Time: No patient exposure Contrast: None used. Fluoroscopic Guidance: N/A Ultrasound Guidance: N/A Interpretation: N/A  Antibiotic Prophylaxis:  Indication(s): None identified Antibiotic given: None  Post-operative Assessment:  EBL: None Complications: No immediate post-treatment complications observed by team, or reported by patient. Note: The patient tolerated the entire procedure well. A repeat set of vitals were taken after the procedure and the patient was kept under observation following institutional policy, for this type of procedure. Post-procedural neurological assessment was performed, showing return to baseline, prior to discharge. The patient was provided with post-procedure discharge instructions, including a section on how to identify potential problems. Should any problems arise concerning this procedure, the patient was given instructions to immediately contact us, at any time, without hesitation. In any case, we plan to contact the patient by telephone for a follow-up status report regarding this interventional procedure. Comments:  No additional relevant information.  Plan of Care   Imaging Orders  No imaging studies ordered today    Procedure Orders     KNEE INJECTION     KNEE INJECTION  Medications ordered for procedure: Meds ordered this encounter  Medications  . lidocaine (PF) (XYLOCAINE) 1 % injection 5 mL  . Sodium Hyaluronate SOSY 2 mL  . Sodium Hyaluronate SOSY 2 mL  . ropivacaine (PF) 2 mg/mL (0.2%) (NAROPIN) injection 2 mL    Medications administered: We administered lidocaine (PF), Sodium Hyaluronate, Sodium Hyaluronate, and ropivacaine (PF) 2 mg/mL (0.2%).  See the medical record for exact dosing, route, and time of administration.  New Prescriptions   No medications on file   Disposition: Discharge home  Discharge Date & Time: 05/10/2017; 1010 hrs.   Physician-requested Follow-up: Return in about 2 weeks (around 05/24/2017) for Procedure (no sedation): (B) IA Hyalgan #2. Future Appointments Date Time Provider Department Center  05/24/2017 10:15 AM Delano MetzNaveira, Heba Ige, MD [facility] None  06/22/2017 1:00 PM Delma FreezeHackney, Tina A, FNP [facility] None  07/27/2017 10:30 AM Barbette [facility]inoKing, Crystal M[facility]   Primary Care Physician: Hillery AldoPatel, Sarah, MD [facility] Note by: Oswaldo DoneFrancisco A Jahmarion Popoff, MD Date: 05/10/2017; Time: 10:49 AM  Disclaimer:  Medicine is not an exact science. The only guarantee in medicine is that nothing is guaranteed. It is important to note that the decision to proceed with this intervention was based on the information collected from the patient. The Data and conclusions were drawn from the patient's questionnaire, the interview, and the physical examination. Because the information was provided in large part by the patient, it cannot be guaranteed that it has not been purposely or unconsciously manipulated. Every effort has been made to obtain as much relevant data as possible for this evaluation. It is important to note that the conclusions that lead to this procedure are derived in large part from the available data. Always take into account that the treatment will also be dependent on availability of resources and existing treatment guidelines, considered by other Pain Management Practitioners as being common knowledge and practice, at the time of the intervention. For Medico-Legal purposes, it is also important to point out that variation in procedural techniques  and pharmacological choices are the acceptable norm. The indications, contraindications, technique, and results of the above procedure should only be interpreted and judged by a Board-Certified Interventional Pain Specialist with extensive familiarity and expertise in the same exact procedure and technique.

## 2017-05-10 NOTE — Progress Notes (Signed)
Safety precautions to be maintained throughout the outpatient stay will include: orient to surroundings, keep bed in low position, maintain call bell within reach at all times, provide assistance with transfer out of bed and ambulation.  

## 2017-05-10 NOTE — Patient Instructions (Signed)

## 2017-05-11 ENCOUNTER — Telehealth: Payer: Self-pay | Admitting: *Deleted

## 2017-05-11 NOTE — Telephone Encounter (Signed)
Attempted to call for post procedure follow-up. Message left. 

## 2017-05-24 ENCOUNTER — Encounter: Payer: Medicaid Other | Admitting: Nurse Practitioner

## 2017-05-24 ENCOUNTER — Ambulatory Visit: Payer: Medicaid Other | Admitting: Pain Medicine

## 2017-05-24 NOTE — Progress Notes (Deleted)
Patient's Name: Erica [name]  MRN: [id number]  Referring Provider: Delano Metz, MD  DOB: 1966-05-08  PCP: Hillery Aldo, MD  DOS: 05/24/2017  Note by: Oswaldo Done, MD  Service setting: Ambulatory outpatient  Specialty: Interventional Pain Management  Patient type: Established  Location: [facility] (AMB) Pain Management Facility  Visit type: Interventional Procedure   Primary Reason for Visit: Interventional Pain Management Treatment. CC: No chief complaint on file.  Procedure:  Anesthesia, Analgesia, Anxiolysis:  Type: Therapeutic Intra-Articular Hyalgan Knee Injection #2 Region: Lateral infrapatellar Knee Region Level: Knee Joint Laterality: Bilateral  Type: Local Anesthesia Local Anesthetic: Lidocaine 1% Route: Infiltration ([place]/IM) IV Access: Declined Sedation: Declined  Indication(s): Analgesia           Indications: 1. Chronic knee pain [facility] source of pain) (Bilateral) (L>R)   2. Osteoarthritis of knee (Bilateral) (L>R)    Pain Score: Pre-procedure:  /10 Post-procedure:  /10  Pre-op Assessment:  Erica [name] is a 51 y.o. (year old), female patient, seen today for interventional treatment. She  has a past surgical history that includes Knee surgery (Left) and FLEXIBLE BRONCHOSCOPY (N/A, 06/20/2015). Erica [name] has a current medication list which includes the following prescription(s): albuterol, aspirin, atorvastatin, atorvastatin, vitamin d3, diltiazem, diltiazem, duloxetine, duloxetine, fluticasone-salmeterol, folic acid, furosemide, losartan, magnesium oxide (antacid), metformin, oxycodone, oxycodone, oxycodone, oxygen-helium, pantoprazole, potassium chloride sa, pregabalin, sucralfate, tiotropium, and tizanidine. Her primarily concern today is the No chief complaint on file.  Initial Vital Signs: Last menstrual period 03/13/2015. BMI: Estimated body mass index is 30.41 kg/m as calculated from the following:   Height as of 05/10/17: 5\' 8"  (1.727 m).   Weight as of  05/10/17: 200 lb (90.7 kg).  Risk Assessment: Allergies: Reviewed. She is allergic to tramadol and penicillins.  Allergy Precautions: None required Coagulopathies: Reviewed. None identified.  Blood-thinner therapy: None at this time Active Infection(s): Reviewed. None identified. Erica [name] is afebrile  Site Confirmation: Erica [name] was asked to confirm the procedure and laterality before marking the site Procedure checklist: Completed Consent: Before the procedure and under the influence of no sedative(s), amnesic(s), or anxiolytics, the patient was informed of the treatment options, risks and possible complications. To fulfill our ethical and legal obligations, as recommended by the American Medical Association's Code of Ethics, I have informed the patient of my clinical impression; the nature and purpose of the treatment or procedure; the risks, benefits, and possible complications of the intervention; the alternatives, including doing nothing; the risk(s) and benefit(s) of the alternative treatment(s) or procedure(s); and the risk(s) and benefit(s) of doing nothing. The patient was provided information about the general risks and possible complications associated with the procedure. These may include, but are not limited to: failure to achieve desired goals, infection, bleeding, organ or nerve damage, allergic reactions, paralysis, and death. In addition, the patient was informed of those risks and complications associated to the procedure, such as failure to decrease pain; infection; bleeding; organ or nerve damage with subsequent damage to sensory, motor, and/or autonomic systems, resulting in permanent pain, numbness, and/or weakness of one or several areas of the body; allergic reactions; (i.e.: anaphylactic reaction); and/or death. Furthermore, the patient was informed of those risks and complications associated with the medications. These include, but are not limited to: allergic reactions  (i.e.: anaphylactic or anaphylactoid reaction(s)); adrenal axis suppression; blood sugar elevation that in diabetics may result in ketoacidosis or comma; water retention that in patients with history of congestive heart failure may result in shortness of breath, pulmonary  edema, and decompensation with resultant heart failure; weight gain; swelling or edema; medication-induced neural toxicity; particulate matter embolism and blood vessel occlusion with resultant organ, and/or nervous system infarction; and/or aseptic necrosis of one or more joints. Finally, the patient was informed that Medicine is not an exact science; therefore, there is also the possibility of unforeseen or unpredictable risks and/or possible complications that may result in a catastrophic outcome. The patient indicated having understood very clearly. We have given the patient no guarantees and we have made no promises. Enough time was given to the patient to ask questions, all of which were answered to the patient's satisfaction. Erica [name] has indicated that she wanted to continue with the procedure. Attestation: I, the ordering provider, attest that I have discussed with the patient the benefits, risks, side-effects, alternatives, likelihood of achieving goals, and potential problems during recovery for the procedure that I have provided informed consent. Date: 05/24/2017; Time: 7:47 AM  Pre-Procedure Preparation:  Monitoring: As per clinic protocol. Respiration, ETCO2, SpO2, BP, heart rate and rhythm monitor placed and checked for adequate function Safety Precautions: Patient was assessed for positional comfort and pressure points before starting the procedure. Time-out: I initiated and conducted the "Time-out" before starting the procedure, as per protocol. The patient was asked to participate by confirming the accuracy of the "Time Out" information. Verification of the correct person, site, and procedure were performed and confirmed  by me, the nursing staff, and the patient. "Time-out" conducted as per Joint Commission's Universal Protocol (UP.01.01.01). "Time-out" Date & Time: 05/24/2017;   hrs.  Description of Procedure Process:   Position: Sitting Target Area: Knee Joint Approach: Just above the Lateral tibial plateau, lateral to the infrapatellar tendon. Area Prepped: Entire knee area, from the mid-thigh to the mid-shin. Prepping solution: ChloraPrep (2% chlorhexidine gluconate and 70% isopropyl alcohol) Safety Precautions: Aspiration looking for blood return was conducted prior to all injections. At no point did we inject any substances, as a needle was being advanced. No attempts were made at seeking any paresthesias. Safe injection practices and needle disposal techniques used. Medications properly checked for expiration dates. SDV (single dose vial) medications used. Description of the Procedure: Protocol guidelines were followed. The patient was placed in position over the fluoroscopy table. The target area was identified and the area prepped in the usual manner. Skin desensitized using vapocoolant spray. Skin & deeper tissues infiltrated with local anesthetic. Appropriate amount of time allowed to pass for local anesthetics to take effect. The procedure needles were then advanced to the target area. Proper needle placement secured. Negative aspiration confirmed. Solution injected in intermittent fashion, asking for systemic symptoms every 0.5cc of injectate. The needles were then removed and the area cleansed, making sure to leave some of the prepping solution back to take advantage of its long term bactericidal properties. There were no vitals filed for this visit.  Start Time:   hrs. End Time:   hrs. Materials:  Needle(s) Type: Regular needle Gauge: 22G Length: 3.5-in Medication(s): Erica [name] had no medications administered during this visit. Please see chart orders for dosing details.  Imaging Guidance:   Type of Imaging Technique: None used Indication(s): N/A Exposure Time: No patient exposure Contrast: None used. Fluoroscopic Guidance: N/A Ultrasound Guidance: N/A Interpretation: N/A  Antibiotic Prophylaxis:  Indication(s): None identified Antibiotic given: None  Post-operative Assessment:  EBL: None Complications: No immediate post-treatment complications observed by team, or reported by patient. Note: The patient tolerated the entire procedure well. A repeat set of  vitals were taken after the procedure and the patient was kept under observation following institutional policy, for this type of procedure. Post-procedural neurological assessment was performed, showing return to baseline, prior to discharge. The patient was provided with post-procedure discharge instructions, including a section on how to identify potential problems. Should any problems arise concerning this procedure, the patient was given instructions to immediately contact us, at any time, without hesitation. In any case, we plan to contact the patient by telephone for a follow-up status report regarding this interventional procedure. Comments:  No additional relevant information.  Plan of Care   Imaging Orders  No imaging studies ordered today   Procedure Orders    No procedure(s) ordered today    Medications ordered for procedure: No orders of the defined types were placed in this encounter.  Medications administered: Erica LeashDonna A. Charm [name] had no medications administered during this visit.  See the medical record for exact dosing, route, and time of administration.  This SmartLink is deprecated. Use AVSMEDLIST instead to display the medication list for a patient. Disposition: Discharge home  Discharge Date & Time: 05/24/2017;   hrs.   Physician-requested Follow-up: No Follow-up on file. Future Appointments  Date Time Provider Department Center  05/24/2017 10:15 AM Delano MetzNaveira, Money Mckeithan, MD [facility] None   06/22/2017  1:00 PM Delma FreezeHackney, Tina A, FNP [facility] None  07/27/2017 10:30 AM Barbette MerinoKing, Crystal M, NP [facility] None   Primary Care Physician: Hillery AldoPatel, Sarah, MD [facility] Note by: Oswaldo DoneFrancisco A Lavell Supple, MD Date: 05/24/2017; Time: 7:47 AM  Disclaimer:  Medicine is not an Visual merchandiserexact science. The only guarantee in medicine is that nothing is guaranteed. It is important to note that the decision to proceed with this intervention was based on the information collected from the patient. The Data and conclusions were drawn from the patient's questionnaire, the interview, and the physical examination. Because the information was provided in large part by the patient, it cannot be guaranteed that it has not been purposely or unconsciously manipulated. Every effort has been made to obtain as much relevant data as possible for this evaluation. It is important to note that the conclusions that lead to this procedure are derived in large part from the available data. Always take into account that the treatment will also be dependent on availability of resources and existing treatment guidelines, considered by other Pain Management Practitioners as being common knowledge and practice, at the time of the intervention. For Medico-Legal purposes, it is also important to point out that variation in procedural techniques and pharmacological choices are the acceptable norm. The indications, contraindications, technique, and results of the above procedure should only be interpreted and judged by a Board-Certified Interventional Pain Specialist with extensive familiarity and expertise in the same exact procedure and technique.

## 2017-06-22 ENCOUNTER — Telehealth: Payer: Self-pay | Admitting: Family

## 2017-06-22 ENCOUNTER — Ambulatory Visit: Payer: Medicaid Other | Admitting: Family

## 2017-06-22 NOTE — Telephone Encounter (Signed)
Patient did not show for her [facility] appointment on 06/22/17. Will attempt to reschedule.
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
## 2017-07-18 ENCOUNTER — Other Ambulatory Visit: Payer: Self-pay

## 2017-07-18 ENCOUNTER — Ambulatory Visit: Payer: Medicaid Other | Attending: Family | Admitting: Family

## 2017-07-18 ENCOUNTER — Encounter: Payer: Self-pay | Admitting: Family

## 2017-07-18 VITALS — BP 131/67 | HR 80 | Temp 98.4°F | Ht 68.0 in | Wt 201.8 lb

## 2017-07-18 DIAGNOSIS — G4733 Obstructive sleep apnea (adult) (pediatric): Secondary | ICD-10-CM | POA: Insufficient documentation

## 2017-07-18 DIAGNOSIS — Z7982 Long term (current) use of aspirin: Secondary | ICD-10-CM | POA: Diagnosis not present

## 2017-07-18 DIAGNOSIS — I110 Hypertensive heart disease with heart failure: Secondary | ICD-10-CM | POA: Insufficient documentation

## 2017-07-18 DIAGNOSIS — Z880 Allergy status to penicillin: Secondary | ICD-10-CM | POA: Insufficient documentation

## 2017-07-18 DIAGNOSIS — Z79899 Other long term (current) drug therapy: Secondary | ICD-10-CM | POA: Diagnosis not present

## 2017-07-18 DIAGNOSIS — I10 Essential (primary) hypertension: Secondary | ICD-10-CM | POA: Insufficient documentation

## 2017-07-18 DIAGNOSIS — I272 Pulmonary hypertension, unspecified: Secondary | ICD-10-CM | POA: Diagnosis not present

## 2017-07-18 DIAGNOSIS — I5032 Chronic diastolic (congestive) heart failure: Secondary | ICD-10-CM | POA: Diagnosis present

## 2017-07-18 DIAGNOSIS — Z87891 Personal history of nicotine dependence: Secondary | ICD-10-CM | POA: Insufficient documentation

## 2017-07-18 DIAGNOSIS — J449 Chronic obstructive pulmonary disease, unspecified: Secondary | ICD-10-CM | POA: Insufficient documentation

## 2017-07-18 DIAGNOSIS — Z7984 Long term (current) use of oral hypoglycemic drugs: Secondary | ICD-10-CM | POA: Diagnosis not present

## 2017-07-18 DIAGNOSIS — E785 Hyperlipidemia, unspecified: Secondary | ICD-10-CM | POA: Diagnosis not present

## 2017-07-18 DIAGNOSIS — E119 Type 2 diabetes mellitus without complications: Secondary | ICD-10-CM | POA: Diagnosis not present

## 2017-07-18 LAB — GLUCOSE, CAPILLARY: GLUCOSE-CAPILLARY: 120 mg/dL — AB (ref 65–99)

## 2017-07-18 NOTE — Patient Instructions (Addendum)
Continue weighing daily and call for an overnight weight gain of > 2 pounds or a weekly weight gain of >5 pounds.  Resume losartan daily but take 1/2 tablet daily (50mg )

## 2017-07-18 NOTE — Progress Notes (Signed)
Patient ID: Erica [name], female    DOB: October 20, 1965, 51 y.o.   MRN: [id number]  HPI  Ms Erica [name] is a 51 y/o female with a history of obstructive sleep apnea, HTN, hyperlipidemia, COPD, asthma, pulmonary HTN, chronic tobacco use and chronic heart failure.  Last echo was done on 06/14/15 and showed an EF of 60-65% with moderately elevated PA pressure at 56 mm Hg. Last PFT's were done 04/19/16. Recently had a chest CT done on 04/27/16 due to ground glass infiltrate.   Was in the ED 04/08/17 due to back pain and dizziness. She was evaluated and released.  She presents today for a follow-up visit with a chief complaint of moderate fatigue upon minimal exertion. She describes this as chronic in nature having been present for several years with varying levels of severity. She has associated blurry vision, chronic cough, shortness of breath, headache, weakness, chest pain, edema and difficulty sleeping. She denies any palpitations, abdominal swelling or weight gain. Recently been diagnosed with diabetes but currently does not have a glucometer.   Past Medical History:  Diagnosis Date  . Arthritis   . Asthma   . CHF (congestive heart failure) (HCC)   . COPD (chronic obstructive pulmonary disease) (HCC)   . Gastric ulcer   . Hyperlipidemia   . Hypertension   . OSA on CPAP no ins - no cpap machine for the last 4 years   Past Surgical History:  Procedure Laterality Date  . FLEXIBLE BRONCHOSCOPY N/A 06/20/2015   Procedure: FLEXIBLE BRONCHOSCOPY;  Surgeon: Erica CrutchPradeep Ramachandran, MD;  Location: [facility] ORS;  Service: Pulmonary;  Laterality: N/A;  . KNEE SURGERY Left    8 knee surgeries   Family History  Problem Relation Age of Onset  . Lung cancer Father   . Breast cancer Mother        early 8060's  . Diabetes Maternal Grandmother    Social History  Substance Use Topics  . Smoking status: Former Smoker    Packs/day: 1.00    Years: 36.00    Types: Cigarettes    Quit date: 06/13/2015  . Smokeless  tobacco: Never Used  . Alcohol use 0.0 oz/week     Comment: occasionaly   Allergies  Allergen Reactions  . Tramadol Other (See Comments)    Heart palpatations  . Penicillins Rash   Prior to Admission medications   Medication Sig Start Date End Date Taking? Authorizing Provider  albuterol (PROAIR HFA) 108 (90 Base) MCG/ACT inhaler Inhale into the lungs 4 (four) times daily.    [provider]  aspirin EC 81 MG EC tablet Take 1 tablet (81 mg total) by mouth daily. 06/23/15   Shaune [name], Qing, MD  atorvastatin (LIPITOR) 40 MG tablet Take 40 mg by mouth daily.    [provider]  Cholecalciferol (VITAMIN D3) 2000 units capsule Take 1 capsule (2,000 Units total) by mouth daily. 06/29/16   Delano [name], Francisco, MD  diltiazem (CARDIZEM CD) 120 MG 24 hr capsule Take 1 capsule (120 mg total) by mouth daily. 12/23/16   Antonieta [name], Erica J, MD  diltiazem (CARDIZEM) 30 MG tablet Take 1 tablet (30 mg total) by mouth 3 (three) times daily as needed. 03/23/17   Antonieta [name], Erica J, MD  DULoxetine (CYMBALTA) 30 MG capsule Take 30 mg by mouth.    [provider]  DULoxetine (CYMBALTA) 60 MG capsule Take 60 mg by mouth daily.    [provider]  Fluticasone-Salmeterol (ADVAIR DISKUS IN) Inhale into the lungs 2 (two)  times daily.    [provider]  folic acid (FOLVITE) 1 MG tablet Take 1 mg by mouth daily.    [provider]  furosemide (LASIX) 40 MG tablet Take 40 mg by mouth daily. Take an additional 40 mg in the am for weight gain as needed    [provider]  Magnesium Oxide, Antacid, 500 MG CAPS once daily. 11/08/16   [provider]  metFORMIN (GLUCOPHAGE) 500 MG tablet Take 500 mg by mouth daily with breakfast.    [provider]  oxyCODONE (OXY IR/ROXICODONE) 5 MG immediate release tablet Take 1 tablet (5 mg total) by mouth every 8 (eight) hours as needed for severe pain. 05/18/17 06/17/17  Delano [name], Francisco, MD  oxyCODONE (OXY  IR/ROXICODONE) 5 MG immediate release tablet Take 1 tablet (5 mg total) by mouth every 8 (eight) hours as needed for severe pain. 06/17/17 07/17/17  Delano [name], Francisco, MD  oxyCODONE (OXY IR/ROXICODONE) 5 MG immediate release tablet Take 1 tablet (5 mg total) by mouth every 8 (eight) hours as needed for severe pain. 07/17/17 08/16/17  Delano [name], Francisco, MD  OXYGEN Inhale into the lungs. 2 liters at bedtime    [provider]  pantoprazole (PROTONIX) 40 MG tablet Take 1 tablet (40 mg total) by mouth daily. 06/23/15   Shaune [name], Qing, MD  potassium chloride SA (K-DUR,KLOR-CON) 20 MEQ tablet Take 20 mEq by mouth 2 (two) times daily.    [provider]  pregabalin (LYRICA) 75 MG capsule Take 1 capsule (75 mg total) by mouth 3 (three) times daily. 05/22/17 08/16/17  Delano [name], Francisco, MD  sucralfate (CARAFATE) 1 G tablet Take 1 tablet (1 g total) by mouth 4 (four) times daily -  with meals and at bedtime. 06/23/15   Shaune [name], Qing, MD  tiotropium (SPIRIVA HANDIHALER) 18 MCG inhalation capsule Place 18 mcg into inhaler and inhale daily.    [provider]  tiZANidine (ZANAFLEX) 4 MG capsule Take 1 capsule (4 mg total) by mouth 3 (three) times daily as needed for muscle spasms. 05/22/17 08/16/17  Delano [name], Francisco, MD    Review of Systems  Constitutional: Positive for fatigue. Negative for appetite change.  HENT: Positive for sinus pressure. Negative for congestion and postnasal drip.   Eyes: Positive for visual disturbance (blurry vision).  Respiratory: Positive for cough and shortness of breath. Negative for chest tightness.   Cardiovascular: Positive for chest pain (at times) and leg swelling. Negative for palpitations.  Gastrointestinal: Negative for abdominal distention and abdominal pain.  Endocrine: Negative.   Genitourinary: Negative.   Musculoskeletal: Positive for back pain. Negative for neck stiffness.  Skin: Negative.   Allergic/Immunologic: Negative.   Neurological:  Positive for weakness ("off-balance at times") and headaches (at times). Negative for dizziness and light-headedness.  Hematological: Negative for adenopathy. Does not bruise/bleed easily.  Psychiatric/Behavioral: Positive for sleep disturbance (wearing oxygen at bedtime at 2L and PRN during the day). Negative for dysphoric mood. The patient is not nervous/anxious.    Vitals:   07/18/17 1505  BP: 131/67  Pulse: 80  Temp: 98.4 F (36.9 C)  TempSrc: Oral  Weight: 201 lb 12.8 oz (91.5 kg)  Height: 5\' 8"  (1.727 m)   Wt Readings from Last 3 Encounters:  07/18/17 201 lb 12.8 oz (91.5 kg)  05/10/17 200 lb (90.7 kg)  05/02/17 200 lb (90.7 kg)    Lab Results  Component Value Date   CREATININE 0.85 04/08/2017   CREATININE 0.95 06/14/2016   CREATININE 0.65 04/22/2016  Physical Exam  Constitutional: She is oriented to person, place, and time. She appears well-developed and well-nourished.  HENT:  Head: Normocephalic and atraumatic.  Neck: Normal range of motion. Neck supple. No JVD present.  Cardiovascular: Normal rate and regular rhythm.  Pulmonary/Chest: Effort normal. She has no wheezes. She has no rales.  Abdominal: Soft. She exhibits no distension. There is no tenderness.  Musculoskeletal: She exhibits edema (trace edema around bilateral ankles). She exhibits no tenderness.  Neurological: She is alert and oriented to person, place, and time.  Skin: Skin is warm and dry.  Psychiatric: She has a normal mood and affect. Her behavior is normal. Thought content normal.  Nursing note and vitals reviewed.     Assessment & Plan:  1: Chronic heart failure with preserved ejection fraction- - NYHA class III - euvolemic - weighing daily; reminded to call for an overnight weight gain of >2 pounds or a weekly weight gain of >5 pounds - not adding any salt to her food - has been trying to drink more water but still occasionally drinks pepsi - Unable to wear CPAP as it causes panic  attacks. Does wear oxygen at 2L at bedtime - will get echocardiogram scheduled  2: Pulmonary HTN- - Saw pulmonologist Meredeth Ide) 02/16/17 - Encouraged patient to follow up with pulmonologist regarding persistent cough  - can try taking her carfate QID (currently taking BID) to see if that improves cough - Wearing oxygent at 2L at bedtime  3: Diabetes- - now taking metformin - currently doesn't have a glucometer; is planning on getting RX from her PCP - nonfasting glucose in clinic was 120 - will make a [facility] referral and patient is agreeable as she says that she's struggling with knowing what to eat  4: HTN- - Checks blood pressure at home. Currently taking Cardizem CD 120mg  daily with an additional Cardizem 30mg  prn.  - says that her home BP runs high at times; will restart losartan but will start at 50mg  daily (1/2 tablet) and instructed her to take it at a different time of day then when she takes her cardizem - BMP from 04/08/17 reviewed and showed sodium 132, potassium 3.5 and GFR >60  Patient did not bring her medications nor a list. Each medication was verbally reviewed with the patient and she was encouraged to bring the bottles to every visit to confirm accuracy of list.  Return in 2 months or sooner for any questions/problems before then.

## 2017-07-22 ENCOUNTER — Other Ambulatory Visit: Payer: Self-pay | Admitting: Family

## 2017-07-27 ENCOUNTER — Encounter: Payer: Medicaid Other | Admitting: Nurse Practitioner

## 2017-07-28 ENCOUNTER — Ambulatory Visit: Payer: Medicaid Other

## 2017-08-04 ENCOUNTER — Ambulatory Visit: Payer: Medicaid Other | Admitting: Physician Assistant

## 2017-08-05 ENCOUNTER — Encounter: Payer: Self-pay | Admitting: Physician Assistant

## 2017-09-08 ENCOUNTER — Ambulatory Visit: Payer: Medicaid Other | Admitting: Nurse Practitioner

## 2017-09-08 ENCOUNTER — Encounter: Payer: Self-pay | Admitting: Nurse Practitioner

## 2017-09-08 VITALS — BP 108/60 | HR 72 | Ht 68.0 in | Wt 203.2 lb

## 2017-09-08 DIAGNOSIS — I5032 Chronic diastolic (congestive) heart failure: Secondary | ICD-10-CM

## 2017-09-08 DIAGNOSIS — E119 Type 2 diabetes mellitus without complications: Secondary | ICD-10-CM | POA: Diagnosis not present

## 2017-09-08 DIAGNOSIS — R079 Chest pain, unspecified: Secondary | ICD-10-CM | POA: Diagnosis not present

## 2017-09-08 DIAGNOSIS — I10 Essential (primary) hypertension: Secondary | ICD-10-CM

## 2017-09-08 DIAGNOSIS — E782 Mixed hyperlipidemia: Secondary | ICD-10-CM

## 2017-09-08 NOTE — Patient Instructions (Addendum)
Medication Instructions: - Your physician has recommended you make the following change in your medication:  1) INCREASE losartan 100 mg- take 1/2 tablet (50 mg) by mouth twice daily  Labwork: - none ordered  Procedures/Testing: - Your physician has requested that you have a lexiscan myoview (1 day)- next week. For further information please visit [email or web address]. Please follow instruction sheet, as given.  [facility] MYOVIEW  Your caregiver has ordered a Stress Test with nuclear imaging. The purpose of this test is to evaluate the blood supply to your heart muscle. This procedure is referred to as a "Non-Invasive Stress Test." This is because other than having an IV started in your vein, nothing is inserted or "invades" your body. Cardiac stress tests are done to find areas of poor blood flow to the heart by determining the extent of coronary artery disease (CAD). Some patients exercise on a treadmill, which naturally increases the blood flow to your heart, while others who are  unable to walk on a treadmill due to physical limitations have a pharmacologic/chemical stress agent called Lexiscan . This medicine will mimic walking on a treadmill by temporarily increasing your coronary blood flow.   Please note: these test may take anywhere between 2-4 hours to complete  [facility] REPORT TO [facility]  THE VOLUNTEERS AT THE FIRST DESK WILL DIRECT YOU WHERE TO GO  Date of Procedure:_________Monday 2/25/19_________________  Arrival Time for Procedure:________8:45 am__________________  Instructions regarding medication:   __x__ : Hold diabetes medication morning of procedure (metformin)   _x___:  Hold other medications as follows:_hold lasix (furosemide) the morning of your procedure.   PLEASE NOTIFY THE OFFICE AT LEAST 24 HOURS IN ADVANCE IF YOU ARE UNABLE TO KEEP YOUR APPOINTMENT.  [phone] AND  PLEASE NOTIFY [facility] AT [facility] AT LEAST 24 HOURS IN ADVANCE IF YOU ARE  UNABLE TO KEEP YOUR APPOINTMENT. [phone]  How to prepare for your Myoview test:  1. Do not eat or drink after midnight 2. No caffeine for 24 hours prior to test 3. No smoking 24 hours prior to test. 4. Your medication may be taken with water.  If your doctor stopped a medication because of this test, do not take that medication. 5. Ladies, please do not wear dresses.  Skirts or pants are appropriate. Please wear a short sleeve shirt. 6. No perfume, cologne or lotion. 7. Wear comfortable walking shoes. No heels!   Follow-Up: - Your physician wants you to follow-up in: 6 months with Dr. Mariah MillingGollan. You will receive a reminder letter in the mail two months in advance. If you don't receive a letter, please call our office to schedule the follow-up appointment.   Any Additional Special Instructions Will Be Listed Below (If Applicable).     If you need a refill on your cardiac medications before your next appointment, please call your pharmacy.

## 2017-09-08 NOTE — Progress Notes (Signed)
Office Visit    Patient Name: Erica [name] Date of Encounter: 09/08/2017  Primary Care Provider:  Hillery Aldo, MD Primary Cardiologist:  Julien Nordmann, MD  Chief Complaint    52 y/o ? with a history of HFpEF, asthma, COPD, type 2 diabetes mellitus, hypertension, hyperlipidemia, and obstructive sleep apnea, who presents for follow-up.  Past Medical History    Past Medical History:  Diagnosis Date  . (HFpEF) heart failure with preserved ejection fraction (HCC)    a. 05/2015 Echo: EF 60-65%, no rwma, PASP .  . Arthritis   . Asthma   . COPD (chronic obstructive pulmonary disease) (HCC)   . Diabetes mellitus without complication (HCC)    type II 03/2017  . Gastric ulcer   . Hyperlipidemia   . Hypertension   . OSA (obstructive sleep apnea)    a. did not tolerate CPAP.   Past Surgical History:  Procedure Laterality Date  . FLEXIBLE BRONCHOSCOPY N/A 06/20/2015   Procedure: FLEXIBLE BRONCHOSCOPY;  Surgeon: Shane Crutch, MD;  Location: [facility] ORS;  Service: Pulmonary;  Laterality: N/A;  . KNEE SURGERY Left    8 knee surgeries    Allergies  Allergies  Allergen Reactions  . Tramadol Other (See Comments) and Palpitations    Heart Skipping Beats Heart palpatations  . Penicillins Rash    History of Present Illness    52 y/o ? with the above complex past medical history including diabetes, hypertension, hyperlipidemia, remote tobacco abuse, COPD, asthma, sleep apnea, and HFpEF.  She was last seen here in June 2018 and is otherwise followed in the heart failure clinic, last seen there in December.  From a volume standpoint, she says that she has been doing well.  Her weight is been stable and she denies PND, orthopnea, edema, or early satiety.  She does have some degree of chronic dyspnea on exertion and is fairly sedentary.  She also reports intermittent chest discomfort, often occurring in the evening in the setting of elevated blood pressures while at rest.   This may or may not be associated with dyspnea.  Symptoms typically last under 5 minutes and resolve spontaneously.  When pressures are elevated or she is expressing chest discomfort in the evening, she will take a diltiazem 30 mg as needed, which she thinks helps to get her blood pressure down and relieve symptoms.  She does not typically experience palpitations with chest discomfort.  Of note, she had been prescribed losartan 100 mg daily but she felt that it may have lowered her blood pressure too much and therefore, she has only been taking half a tablet daily.  She says that though her blood pressures in the mornings typically run in the 130s, in the evenings, she often sees pressures in the 170s-180s.  It is in these times that chest discomfort typically occurs.  Home Medications    Prior to Admission medications   Medication Sig Start Date End Date Taking? Authorizing Provider  albuterol (PROAIR HFA) 108 (90 Base) MCG/ACT inhaler Inhale into the lungs 4 (four) times daily.   Yes [provider]  aspirin EC 81 MG EC tablet Take 1 tablet (81 mg total) by mouth daily. 06/23/15  Yes Shaune Pollack, MD  atorvastatin (LIPITOR) 80 MG tablet Take 80 mg by mouth daily.   Yes [provider]  Cholecalciferol (VITAMIN D3) 2000 units capsule Take 1 capsule (2,000 Units total) by mouth daily. 06/29/16  Yes Delano Metz, MD  diltiazem (CARDIZEM CD) 120 MG 24  hr capsule Take 1 capsule (120 mg total) by mouth daily. 12/23/16  Yes Gollan, Tollie Pizzaimothy J, MD  diltiazem (CARDIZEM) 30 MG tablet Take 1 tablet (30 mg total) by mouth 3 (three) times daily as needed. 03/23/17  Yes Gollan, Tollie Pizzaimothy J, MD  DULoxetine (CYMBALTA) 30 MG capsule Take 30 mg by mouth.   Yes [provider]  DULoxetine (CYMBALTA) 60 MG capsule Take 60 mg by mouth daily.   Yes [provider]  Fluticasone-Salmeterol (ADVAIR DISKUS IN) Inhale into the lungs 2 (two) times daily.   Yes [provider]  folic  acid (FOLVITE) 1 MG tablet Take 1 mg by mouth daily.   Yes [provider]  furosemide (LASIX) 40 MG tablet Take 40 mg by mouth daily. Take an additional 40 mg in the am for weight gain as needed   Yes [provider]  losartan (COZAAR) 100 MG tablet Take 1/2 tablet (50 mg) by mouth twice daily   Yes [provider]  Magnesium Oxide, Antacid, 500 MG CAPS once daily. 11/08/16  Yes [provider]  metFORMIN (GLUCOPHAGE) 500 MG tablet Take 500 mg by mouth 2 (two) times daily with a meal.   Yes [provider]  OXYGEN Inhale into the lungs. 2 liters at bedtime   Yes [provider]  pantoprazole (PROTONIX) 40 MG tablet Take 1 tablet (40 mg total) by mouth daily. 06/23/15  Yes Shaune Pollackhen, Qing, MD  potassium chloride SA (K-DUR,KLOR-CON) 20 MEQ tablet Take 20 mEq by mouth 2 (two) times daily.   Yes [provider]  pregabalin (LYRICA) 75 MG capsule Take 1 capsule (75 mg total) by mouth 3 (three) times daily. 05/22/17 09/08/17 Yes Delano MetzNaveira, Francisco, MD  sucralfate (CARAFATE) 1 G tablet Take 1 tablet (1 g total) by mouth 4 (four) times daily -  with meals and at bedtime. Patient taking differently: Take 1 g by mouth 2 (two) times daily.  06/23/15  Yes Shaune Pollackhen, Qing, MD  tiotropium (SPIRIVA HANDIHALER) 18 MCG inhalation capsule Place 18 mcg into inhaler and inhale daily.   Yes [provider]  tiZANidine (ZANAFLEX) 4 MG capsule Take 1 capsule (4 mg total) by mouth 3 (three) times daily as needed for muscle spasms. 05/22/17 09/08/17 Yes Delano MetzNaveira, Francisco, MD  oxyCODONE (OXY IR/ROXICODONE) 5 MG immediate release tablet Take 1 tablet (5 mg total) by mouth every 8 (eight) hours as needed for severe pain. 05/18/17 06/17/17  Delano MetzNaveira, Francisco, MD  oxyCODONE (OXY IR/ROXICODONE) 5 MG immediate release tablet Take 1 tablet (5 mg total) by mouth every 8 (eight) hours as needed for severe pain. 06/17/17 07/17/17  Delano MetzNaveira, Francisco, MD  oxyCODONE (OXY  IR/ROXICODONE) 5 MG immediate release tablet Take 1 tablet (5 mg total) by mouth every 8 (eight) hours as needed for severe pain. 07/17/17 08/16/17  Delano MetzNaveira, Francisco, MD    Review of Systems    Intermittent chest discomfort 3-4 times per week in the setting of elevated blood pressures.  This may or may not be associated with dyspnea.  She occasionally notes palpitations but these are typically rare and brief.  She denies PND, orthopnea, dizziness, syncope, edema, or early satiety..  All other systems reviewed and are otherwise negative except as noted above.  Physical Exam    VS:  BP 108/60 (BP Location: Left Arm, Patient Position: Sitting, Cuff Size: Normal)   Pulse 72   Ht 5\' 8"  (1.727 m)   Wt 203 lb 4 oz (92.2 kg)   LMP 03/13/2015 (Approximate)  Comment: states she is going through menopause  BMI 30.90 kg/m  , BMI Body mass index is 30.9 kg/m. GEN: Well nourished, well developed, in no acute distress.  HEENT: normal.  Neck: Supple, no JVD, carotid bruits, or masses. Cardiac: RRR, no murmurs, rubs, or gallops. No clubbing, cyanosis, edema.  Radials/DP/PT 2+ and equal bilaterally.  Respiratory:  Respirations regular and unlabored, clear to auscultation bilaterally. GI: Soft, nontender, nondistended, BS + x 4. MS: no deformity or atrophy. Skin: warm and dry, no rash. Neuro:  Strength and sensation are intact. Psych: Normal affect.  Accessory Clinical Findings    ECG -regular sinus rhythm, 72, no acute ST or T changes.  Assessment & Plan    1.  Chest pain: The patient experiences intermittent chest discomfort, about 3-4 times per week, typically in the setting of elevated blood pressures, lasting 5-10 minutes, and resolving spontaneously.  She will often take a diltiazem 30 mg as needed when the symptoms occur.  She does not typically experience exertional symptoms but does have some degree of chronic dyspnea on exertion.  Previous CTs of her chest have shown mild coronary artery  calcifications-last CT was October 2017.  Given risk factors and symptoms, I will arrange for Lexiscan Myoview to rule out ischemia.  2.  Essential hypertension: Blood pressure is soft today at 108/60.  She says this is unusual for her as it typically runs in the 130s during the day and many times in the 170s-180s in the evening.  She is currently taking diltiazem CD 120 mg in the morning along with losartan 50 mg daily.  She had previously been prescribed losartan 100 mg daily but she experienced lightheadedness after taking a dose and therefore has been cutting it in half instead.  I suspect she is underdosed but she has some concerns about going back up to 100 mg daily on losartan.  She is willing to do 50 mg twice a day however.  I think this will serve her better and her overall blood pressure management instead of taking as needed short acting diltiazem, which is only providing a short period of antihypertensive effect.  3.  Hyperlipidemia: Last LDL was 113 in November 2016.  She is on statin therapy in the setting of diabetes.  4.  Type 2 diabetes mellitus: She remains on metformin.  Last A1c we have in our system was 6.2 in November 2016.  This is followed by primary care.  5.  HFpEF: Euvolemic on exam with stable weight at home.  Heart rate stable and blood pressure within normal limits today though, it sounds like she runs quite high in the evenings at home.  Adjusting losartan as above.  6.  COPD: Inhaler therapy per pulmonology.  No active wheezing today.  7.  Disposition: Follow-up stress testing.  Provided that this is normal, plan to follow-up with Dr. Mariah Milling in 6 months or sooner if necessary.  Erica Ducking, NP 09/08/2017, 3:41 PM

## 2017-09-12 ENCOUNTER — Ambulatory Visit: Payer: Medicaid Other

## 2017-09-14 ENCOUNTER — Encounter: Payer: Self-pay | Admitting: Nurse Practitioner

## 2017-09-14 ENCOUNTER — Ambulatory Visit: Payer: Medicaid Other | Attending: Nurse Practitioner | Admitting: Nurse Practitioner

## 2017-09-14 VITALS — BP 134/65 | HR 89 | Temp 98.0°F | Resp 16 | Ht 68.0 in | Wt 200.0 lb

## 2017-09-14 DIAGNOSIS — M545 Low back pain: Secondary | ICD-10-CM | POA: Diagnosis not present

## 2017-09-14 DIAGNOSIS — M25561 Pain in right knee: Secondary | ICD-10-CM

## 2017-09-14 DIAGNOSIS — I5032 Chronic diastolic (congestive) heart failure: Secondary | ICD-10-CM | POA: Insufficient documentation

## 2017-09-14 DIAGNOSIS — E871 Hypo-osmolality and hyponatremia: Secondary | ICD-10-CM | POA: Diagnosis not present

## 2017-09-14 DIAGNOSIS — Z880 Allergy status to penicillin: Secondary | ICD-10-CM | POA: Diagnosis not present

## 2017-09-14 DIAGNOSIS — M170 Bilateral primary osteoarthritis of knee: Secondary | ICD-10-CM | POA: Insufficient documentation

## 2017-09-14 DIAGNOSIS — G4733 Obstructive sleep apnea (adult) (pediatric): Secondary | ICD-10-CM | POA: Insufficient documentation

## 2017-09-14 DIAGNOSIS — M47816 Spondylosis without myelopathy or radiculopathy, lumbar region: Secondary | ICD-10-CM | POA: Insufficient documentation

## 2017-09-14 DIAGNOSIS — I272 Pulmonary hypertension, unspecified: Secondary | ICD-10-CM | POA: Insufficient documentation

## 2017-09-14 DIAGNOSIS — E785 Hyperlipidemia, unspecified: Secondary | ICD-10-CM | POA: Insufficient documentation

## 2017-09-14 DIAGNOSIS — F429 Obsessive-compulsive disorder, unspecified: Secondary | ICD-10-CM | POA: Diagnosis not present

## 2017-09-14 DIAGNOSIS — Z79891 Long term (current) use of opiate analgesic: Secondary | ICD-10-CM

## 2017-09-14 DIAGNOSIS — Z7984 Long term (current) use of oral hypoglycemic drugs: Secondary | ICD-10-CM | POA: Insufficient documentation

## 2017-09-14 DIAGNOSIS — Z7982 Long term (current) use of aspirin: Secondary | ICD-10-CM | POA: Diagnosis not present

## 2017-09-14 DIAGNOSIS — M1610 Unilateral primary osteoarthritis, unspecified hip: Secondary | ICD-10-CM | POA: Insufficient documentation

## 2017-09-14 DIAGNOSIS — G609 Hereditary and idiopathic neuropathy, unspecified: Secondary | ICD-10-CM | POA: Diagnosis not present

## 2017-09-14 DIAGNOSIS — Z5181 Encounter for therapeutic drug level monitoring: Secondary | ICD-10-CM | POA: Diagnosis present

## 2017-09-14 DIAGNOSIS — J449 Chronic obstructive pulmonary disease, unspecified: Secondary | ICD-10-CM | POA: Insufficient documentation

## 2017-09-14 DIAGNOSIS — M792 Neuralgia and neuritis, unspecified: Secondary | ICD-10-CM

## 2017-09-14 DIAGNOSIS — G894 Chronic pain syndrome: Secondary | ICD-10-CM | POA: Diagnosis not present

## 2017-09-14 DIAGNOSIS — Z888 Allergy status to other drugs, medicaments and biological substances status: Secondary | ICD-10-CM | POA: Diagnosis not present

## 2017-09-14 DIAGNOSIS — M25562 Pain in left knee: Secondary | ICD-10-CM

## 2017-09-14 DIAGNOSIS — F329 Major depressive disorder, single episode, unspecified: Secondary | ICD-10-CM | POA: Insufficient documentation

## 2017-09-14 DIAGNOSIS — R252 Cramp and spasm: Secondary | ICD-10-CM | POA: Diagnosis not present

## 2017-09-14 DIAGNOSIS — Z79899 Other long term (current) drug therapy: Secondary | ICD-10-CM | POA: Insufficient documentation

## 2017-09-14 DIAGNOSIS — F209 Schizophrenia, unspecified: Secondary | ICD-10-CM | POA: Insufficient documentation

## 2017-09-14 DIAGNOSIS — M1732 Unilateral post-traumatic osteoarthritis, left knee: Secondary | ICD-10-CM | POA: Diagnosis not present

## 2017-09-14 DIAGNOSIS — M533 Sacrococcygeal disorders, not elsewhere classified: Secondary | ICD-10-CM | POA: Insufficient documentation

## 2017-09-14 DIAGNOSIS — M62830 Muscle spasm of back: Secondary | ICD-10-CM | POA: Diagnosis not present

## 2017-09-14 DIAGNOSIS — I110 Hypertensive heart disease with heart failure: Secondary | ICD-10-CM | POA: Insufficient documentation

## 2017-09-14 DIAGNOSIS — E559 Vitamin D deficiency, unspecified: Secondary | ICD-10-CM | POA: Diagnosis not present

## 2017-09-14 DIAGNOSIS — F17210 Nicotine dependence, cigarettes, uncomplicated: Secondary | ICD-10-CM | POA: Diagnosis not present

## 2017-09-14 DIAGNOSIS — G8929 Other chronic pain: Secondary | ICD-10-CM

## 2017-09-14 DIAGNOSIS — E1142 Type 2 diabetes mellitus with diabetic polyneuropathy: Secondary | ICD-10-CM | POA: Diagnosis not present

## 2017-09-14 MED ORDER — TIZANIDINE HCL 4 MG PO CAPS: 4.0000 mg | ORAL_CAPSULE | Freq: Three times a day (TID) | ORAL | 2 refills | Status: DC | PRN

## 2017-09-14 MED ORDER — PREGABALIN 75 MG PO CAPS: 75.0000 mg | ORAL_CAPSULE | Freq: Three times a day (TID) | ORAL | 0 refills | Status: DC

## 2017-09-14 MED ORDER — OXYCODONE HCL 5 MG PO TABS: 5.0000 mg | ORAL_TABLET | Freq: Three times a day (TID) | ORAL | 0 refills | Status: DC | PRN

## 2017-09-14 MED ORDER — VITAMIN D3 50 MCG (2000 UT) PO CAPS: ORAL_CAPSULE | ORAL | 99 refills | Status: DC

## 2017-09-14 NOTE — Patient Instructions (Addendum)
____________________________________________________________________________________________  Medication Rules  Applies to: All patients receiving prescriptions (written or electronic).  Pharmacy of record: Pharmacy where electronic prescriptions will be sent. If written prescriptions are taken to a different pharmacy, please inform the nursing staff. The pharmacy listed in the electronic medical record should be the one where you would like electronic prescriptions to be sent.  Prescription refills: Only during scheduled appointments. Applies to both, written and electronic prescriptions.  NOTE: The following applies primarily to controlled substances (Opioid* Pain Medications).   Patient's responsibilities: 1. Pain Pills: Bring all pain pills to every appointment (except for procedure appointments). 2. Pill Bottles: Bring pills in original pharmacy bottle. Always bring newest bottle. Bring bottle, even if empty. 3. Medication refills: You are responsible for knowing and keeping track of what medications you need refilled. The day before your appointment, write a list of all prescriptions that need to be refilled. Bring that list to your appointment and give it to the admitting nurse. Prescriptions will be written only during appointments. If you forget a medication, it will not be "Called in", "Faxed", or "electronically sent". You will need to get another appointment to get these prescribed. 4. Prescription Accuracy: You are responsible for carefully inspecting your prescriptions before leaving our office. Have the discharge nurse carefully go over each prescription with you, before taking them home. Make sure that your name is accurately spelled, that your address is correct. Check the name and dose of your medication to make sure it is accurate. Check the number of pills, and the written instructions to make sure they are clear and accurate. Make sure that you are given enough medication to last  until your next medication refill appointment. 5. Taking Medication: Take medication as prescribed. Never take more pills than instructed. Never take medication more frequently than prescribed. Taking less pills or less frequently is permitted and encouraged, when it comes to controlled substances (written prescriptions).  6. Inform other Doctors: Always inform, all of your healthcare providers, of all the medications you take. 7. Pain Medication from other Providers: You are not allowed to accept any additional pain medication from any other Doctor or Healthcare provider. There are two exceptions to this rule. (see below) In the event that you require additional pain medication, you are responsible for notifying us, as stated below. 8. Medication Agreement: You are responsible for carefully reading and following our Medication Agreement. This must be signed before receiving any prescriptions from our practice. Safely store a copy of your signed Agreement. Violations to the Agreement will result in no further prescriptions. (Additional copies of our Medication Agreement are available upon request.) 9. Laws, Rules, & Regulations: All patients are expected to follow all Federal and State Laws, Statutes, Rules, & Regulations. Ignorance of the Laws does not constitute a valid excuse. The use of any illegal substances is prohibited. 10. Adopted CDC guidelines & recommendations: Target dosing levels will be at or below 60 MME/day. Use of benzodiazepines** is not recommended.  Exceptions: There are only two exceptions to the rule of not receiving pain medications from other Healthcare Providers. 1. Exception #1 (Emergencies): In the event of an emergency (i.e.: accident requiring emergency care), you are allowed to receive additional pain medication. However, you are responsible for: As soon as you are able, call our office [phone], at any time of the day or night, and leave a message stating your name, the  date and nature of the emergency, and the name and dose of the medication   prescribed. In the event that your call is answered by a member of our staff, make sure to document and save the date, time, and the name of the person that took your information.  2. Exception #2 (Planned Surgery): In the event that you are scheduled by another doctor or dentist to have any type of surgery or procedure, you are allowed (for a period no longer than 30 days), to receive additional pain medication, for the acute post-op pain. However, in this case, you are responsible for picking up a copy of our "Post-op Pain Management for Surgeons" handout, and giving it to your surgeon or dentist. This document is available at our office, and does not require an appointment to obtain it. Simply go to our office during business hours (Monday-Thursday from 8:00 AM to 4:00 PM) (Friday 8:00 AM to 12:00 Noon) or if you have a scheduled appointment with [place], prior to your surgery, and ask for it by name. In addition, you will need to provide [place] with your name, name of your surgeon, type of surgery, and date of procedure or surgery.  *Opioid medications include: morphine, codeine, oxycodone, oxymorphone, hydrocodone, hydromorphone, meperidine, tramadol, tapentadol, buprenorphine, fentanyl, methadone. **Benzodiazepine medications include: diazepam (Valium), alprazolam (Xanax), clonazepam (Klonopine), lorazepam (Ativan), clorazepate (Tranxene), chlordiazepoxide (Librium), estazolam (Prosom), oxazepam (Serax), temazepam (Restoril), triazolam (Halcion) ____________________________________________________________________________________________  ____________________________________________________________________________________________  Pain Scale  Introduction: The pain score used by this practice is the Verbal Numerical Rating Scale (VNRS-11). This is an 11-point scale. It is for adults and children 10 years or older. There are significant  differences in how the pain score is reported, used, and applied. Forget everything you learned in the past and learn this scoring system.  General Information: The scale should reflect your current level of pain. Unless you are specifically asked for the level of your worst pain, or your average pain. If you are asked for one of these two, then it should be understood that it is over the past 24 hours.  Basic Activities of Daily Living (ADL): Personal hygiene, dressing, eating, transferring, and using restroom.  Instructions: Most patients tend to report their level of pain as a combination of two factors, their physical pain and their psychosocial pain. This last one is also known as "suffering" and it is reflection of how physical pain affects you socially and psychologically. From now on, report them separately. From this point on, when asked to report your pain level, report only your physical pain. Use the following table for reference.  Pain Clinic Pain Levels (0-5/10)  Pain Level Score  Description  No Pain 0   Mild pain 1 Nagging, annoying, but does not interfere with basic activities of daily living (ADL). Patients are able to eat, bathe, get dressed, toileting (being able to get on and off the toilet and perform personal hygiene functions), transfer (move in and out of bed or a chair without assistance), and maintain continence (able to control bladder and bowel functions). Blood pressure and heart rate are unaffected. A normal heart rate for a healthy adult ranges from 60 to 100 bpm (beats per minute).   Mild to moderate pain 2 Noticeable and distracting. Impossible to hide from other people. More frequent flare-ups. Still possible to adapt and function close to normal. It can be very annoying and may have occasional stronger flare-ups. With discipline, patients may get used to it and adapt.   Moderate pain 3 Interferes significantly with activities of daily living (ADL). It becomes  difficult to  feed, bathe, get dressed, get on and off the toilet or to perform personal hygiene functions. Difficult to get in and out of bed or a chair without assistance. Very distracting. With effort, it can be ignored when deeply involved in activities.   Moderately severe pain 4 Impossible to ignore for more than a few minutes. With effort, patients may still be able to manage work or participate in some social activities. Very difficult to concentrate. Signs of autonomic nervous system discharge are evident: dilated pupils (mydriasis); mild sweating (diaphoresis); sleep interference. Heart rate becomes elevated (>115 bpm). Diastolic blood pressure (lower number) rises above 100 mmHg. Patients find relief in laying down and not moving.   Severe pain 5 Intense and extremely unpleasant. Associated with frowning face and frequent crying. Pain overwhelms the senses.  Ability to do any activity or maintain social relationships becomes significantly limited. Conversation becomes difficult. Pacing back and forth is common, as getting into a comfortable position is nearly impossible. Pain wakes you up from deep sleep. Physical signs will be obvious: pupillary dilation; increased sweating; goosebumps; brisk reflexes; cold, clammy hands and feet; nausea, vomiting or dry heaves; loss of appetite; significant sleep disturbance with inability to fall asleep or to remain asleep. When persistent, significant weight loss is observed due to the complete loss of appetite and sleep deprivation.  Blood pressure and heart rate becomes significantly elevated. Caution: If elevated blood pressure triggers a pounding headache associated with blurred vision, then the patient should immediately seek attention at an urgent or emergency care unit, as these may be signs of an impending stroke.    Emergency Department Pain Levels (6-10/10)  Emergency Room Pain 6 Severely limiting. Requires emergency care and should not be seen or  managed at an outpatient pain management facility. Communication becomes difficult and requires great effort. Assistance to reach the emergency department may be required. Facial flushing and profuse sweating along with potentially dangerous increases in heart rate and blood pressure will be evident.   Distressing pain 7 Self-care is very difficult. Assistance is required to transport, or use restroom. Assistance to reach the emergency department will be required. Tasks requiring coordination, such as bathing and getting dressed become very difficult.   Disabling pain 8 Self-care is no longer possible. At this level, pain is disabling. The individual is unable to do even the most "basic" activities such as walking, eating, bathing, dressing, transferring to a bed, or toileting. Fine motor skills are lost. It is difficult to think clearly.   Incapacitating pain 9 Pain becomes incapacitating. Thought processing is no longer possible. Difficult to remember your own name. Control of movement and coordination are lost.   The worst pain imaginable 10 At this level, most patients pass out from pain. When this level is reached, collapse of the autonomic nervous system occurs, leading to a sudden drop in blood pressure and heart rate. This in turn results in a temporary and dramatic drop in blood flow to the brain, leading to a loss of consciousness. Fainting is one of the body's self defense mechanisms. Passing out puts the brain in a calmed state and causes it to shut down for a while, in order to begin the healing process.    Summary: 1. Refer to this scale when providing [place] with your pain level. 2. Be accurate and careful when reporting your pain level. This will help with your care. 3. Over-reporting your pain level will lead to loss of credibility. 4. Even a level of 1/10  means that there is pain and will be treated at our facility. 5. High, inaccurate reporting will be documented as "Symptom  Exaggeration", leading to loss of credibility and suspicions of possible secondary gains such as obtaining more narcotics, or wanting to appear disabled, for fraudulent reasons. 6. Only pain levels of 5 or below will be seen at our facility. 7. Pain levels of 6 and above will be sent to the Emergency Department and the appointment cancelled. ____________________________________________________________________________________________    BMI Assessment: Estimated body mass index is 30.41 kg/m as calculated from the following:   Height as of this encounter: 5\' 8"  (1.727 m).   Weight as of this encounter: 200 lb (90.7 kg).  BMI interpretation table: BMI level Category Range association with higher incidence of chronic pain  <18 kg/m2 Underweight   18.5-24.9 kg/m2 Ideal body weight   25-29.9 kg/m2 Overweight Increased incidence by 20%  30-34.9 kg/m2 Obese (Class I) Increased incidence by 68%  35-39.9 kg/m2 Severe obesity (Class II) Increased incidence by 136%  >40 kg/m2 Extreme obesity (Class III) Increased incidence by 254%   BMI Readings from Last 4 Encounters:  09/14/17 30.41 kg/m  09/08/17 30.90 kg/m  07/18/17 30.68 kg/m  05/10/17 30.41 kg/m   Wt Readings from Last 4 Encounters:  09/14/17 200 lb (90.7 kg)  09/08/17 203 lb 4 oz (92.2 kg)  07/18/17 201 lb 12.8 oz (91.5 kg)  05/10/17 200 lb (90.7 kg)   ____________________________________________________________________________________________  General Risks and Possible Complications  Patient Responsibilities: It is important that you read this as it is part of your informed consent. It is our duty to inform you of the risks and possible complications associated with treatments offered to you. It is your responsibility as a patient to read this and to ask questions about anything that is not clear or that you believe was not covered in this document.  Patient's Rights: You have the right to refuse treatment. You also have the  right to change your mind, even after initially having agreed to have the treatment done. However, under this last option, if you wait until the last second to change your mind, you may be charged for the materials used up to that point.  Introduction: Medicine is not an Visual merchandiserexact science. Everything in Medicine, including the lack of treatment(s), carries the potential for danger, harm, or loss (which is by definition: Risk). In Medicine, a complication is a secondary problem, condition, or disease that can aggravate an already existing one. All treatments carry the risk of possible complications. The fact that a side effects or complications occurs, does not imply that the treatment was conducted incorrectly. It must be clearly understood that these can happen even when everything is done following the highest safety standards.  No treatment: You can choose not to proceed with the proposed treatment alternative. The "PRO(s)" would include: avoiding the risk of complications associated with the therapy. The "CON(s)" would include: not getting any of the treatment benefits. These benefits fall under one of three categories: diagnostic; therapeutic; and/or palliative. Diagnostic benefits include: getting information which can ultimately lead to improvement of the disease or symptom(s). Therapeutic benefits are those associated with the successful treatment of the disease. Finally, palliative benefits are those related to the decrease of the primary symptoms, without necessarily curing the condition (example: decreasing the pain from a flare-up of a chronic condition, such as incurable terminal cancer).  General Risks and Complications: These are associated to most interventional treatments. They can occur alone,  or in combination. They fall under one of the following six (6) categories: no benefit or worsening of symptoms; bleeding; infection; nerve damage; allergic reactions; and/or death. 1. No benefits or  worsening of symptoms: In Medicine there are no guarantees, only probabilities. No healthcare provider can ever guarantee that a medical treatment will work, they can only state the probability that it may. Furthermore, there is always the possibility that the condition may worsen, either directly, or indirectly, as a consequence of the treatment. 2. Bleeding: This is more common if the patient is taking a blood thinner, either prescription or over the counter (example: Goody Powders, Fish oil, Aspirin, Garlic, etc.), or if suffering a condition associated with impaired coagulation (example: Hemophilia, cirrhosis of the liver, low platelet counts, etc.). However, even if you do not have one on these, it can still happen. If you have any of these conditions, or take one of these drugs, make sure to notify your treating physician. 3. Infection: This is more common in patients with a compromised immune system, either due to disease (example: diabetes, cancer, human immunodeficiency virus [HIV], etc.), or due to medications or treatments (example: therapies used to treat cancer and rheumatological diseases). However, even if you do not have one on these, it can still happen. If you have any of these conditions, or take one of these drugs, make sure to notify your treating physician. 4. Nerve Damage: This is more common when the treatment is an invasive one, but it can also happen with the use of medications, such as those used in the treatment of cancer. The damage can occur to small secondary nerves, or to large primary ones, such as those in the spinal cord and brain. This damage may be temporary or permanent and it may lead to impairments that can range from temporary numbness to permanent paralysis and/or brain death. 5. Allergic Reactions: Any time a substance or material comes in contact with our body, there is the possibility of an allergic reaction. These can range from a mild skin rash (contact dermatitis)  to a severe systemic reaction (anaphylactic reaction), which can result in death. 6. Death: In general, any medical intervention can result in death, most of the time due to an unforeseen complication. ____________________________________________________________________________________________   Knee Injection A knee injection is a procedure to get medicine into your knee joint. Your health care provider puts a needle into the joint and injects medicine with an attached syringe. The injected medicine may relieve the pain, swelling, and stiffness of arthritis. The injected medicine may also help to lubricate and cushion your knee joint. You may need more than one injection. Tell a health care provider about:  Any allergies you have.  All medicines you are taking, including vitamins, herbs, eye drops, creams, and over-the-counter medicines.  Any problems you or family members have had with anesthetic medicines.  Any blood disorders you have.  Any surgeries you have had.  Any medical conditions you have. What are the risks? Generally, this is a safe procedure. However, problems may occur, including:  Infection.  Bleeding.  Worsening symptoms.  Damage to the area around your knee.  Allergic reaction to any of the medicines.  Skin reactions from repeated injections.  What happens before the procedure?  Ask your health care provider about changing or stopping your regular medicines. This is especially important if you are taking diabetes medicines or blood thinners.  Plan to have someone take you home after the procedure. What happens during the  procedure?  You will sit or lie down in a position for your knee to be treated.  The skin over your kneecap will be cleaned with a germ-killing solution (antiseptic).  You will be given a medicine that numbs the area (local anesthetic). You may feel some stinging.  After your knee becomes numb, you will have a second injection. This  is the medicine. This needle is carefully placed between your kneecap and your knee. The medicine is injected into the joint space.  At the end of the procedure, the needle will be removed.  A bandage (dressing) may be placed over the injection site. The procedure may vary among health care providers and hospitals. What happens after the procedure?  You may have to move your knee through its full range of motion. This helps to get all of the medicine into your joint space.  Your blood pressure, heart rate, breathing rate, and blood oxygen level will be monitored often until the medicines you were given have worn off.  You will be watched to make sure that you do not have a reaction to the injected medicine. This information is not intended to replace advice given to you by your health care provider. Make sure you discuss any questions you have with your health care provider. Document Released: 09/26/2006 Document Revised: 12/05/2015 Document Reviewed: 05/15/2014 [employer]vier Interactive Patient Education  2018 [employer].

## 2017-09-14 NOTE — Progress Notes (Signed)
Patient's Name: Erica [name]  MRN: [id number]  Referring Provider: Denton Lank, MD  DOB: [birth year]  PCP: Denton Lank, MD  DOS: 09/14/2017  Note by: Vevelyn Francois NP  Service setting: Ambulatory outpatient  Specialty: Interventional Pain Management  Location: [facility] (AMB) Pain Management Facility    Patient type: Established    Primary Reason(s) for Visit: Encounter for prescription drug management & post-procedure evaluation of chronic illness with mild to moderate exacerbation(Level of risk: moderate) CC: Knee Pain (bilateral); Hip Pain (left); and Back Pain (lower bilateral)  HPI  Erica [name] is a 52 y.o. year old, female patient, who comes today for a post-procedure evaluation and medication management. She has Chest pain; Hyponatremia; Shortness of breath; Swelling; Cough; COPD (chronic obstructive pulmonary disease) with acute bronchitis ([place]); Renal insufficiency; Pulmonary HTN ([place]); COPD, mild ([place]); Chronic diastolic heart failure ([place]); Smoker; Obstructive sleep apnea; Tachycardia; Depression; Long term current use of opiate analgesic; Long term prescription opiate use; Opiate use; Chronic low back pain (Primary Source of Pain) (Bilateral) (L>R); Chronic knee pain [facility] source of pain) (Bilateral) (L>R); Chronic neck pain (Bilateral) (R>L); Chronic upper back pain (midline); Chronic foot pain (bottom of feet) (Bilateral) (R>L); Chronic hand pain (Bilateral); Peripheral neuropathy, idiopathic (upper and lower extremity); Chronic sacroiliac joint pain (Bilateral) (R>L); Lumbar facet syndrome (Bilateral) (R>L); Chronic pain syndrome; Elevated sedimentation rate; Elevated C-reactive protein (CRP); Neurogenic pain; Vitamin D deficiency; Lumbar spondylosis; Osteoarthritis of knee (Bilateral) (L>R); Intermittent left thoracic Muscle cramps; Spasm of thoracolumbar muscle (Left); Post-traumatic osteoarthritis of knee (Left); Acute postoperative pain; Hemarthrosis, left knee; Osteoarthritis of hip  (Bilateral) (L>R); Chronic hip pain (Secondary source of pain) (Bilateral) (L>R); Diabetes ([place]); and HTN (hypertension) on their problem list. Her primarily concern today is the Knee Pain (bilateral); Hip Pain (left); and Back Pain (lower bilateral)  Pain Assessment: Location: Left, Right Knee(see visit information) Radiating: na Onset: More than a month ago Duration: Chronic pain Quality: Numbness, Tingling, Discomfort, Constant(feels as if the left hip is out of joint most of the time.) Severity: 7 /10 (self-reported pain score)  Note: Reported level is compatible with observation. Clinically the patient looks like a 1/10 A 1/10 is viewed as "Mild" and described as nagging, annoying, but not interfering with basic activities of daily living (ADL). Erica [name] is able to eat, bathe, get dressed, do toileting (being able to get on and off the toilet and perform personal hygiene functions), transfer (move in and out of bed or a chair without assistance), and maintain continence (able to control bladder and bowel functions). Physiologic parameters such as blood pressure and heart rate apear wnl. Information on the proper use of the pain scale provided to the patient today. When using our objective Pain Scale, levels between 6 and 10/10 are said to belong in an emergency room, as it progressively worsens from a 6/10, described as severely limiting, requiring emergency care not usually available at an outpatient pain management facility. At a 6/10 level, communication becomes difficult and requires great effort. Assistance to reach the emergency department may be required. Facial flushing and profuse sweating along with potentially dangerous increases in heart rate and blood pressure will be evident. Effect on ADL: feels like she has to be in constant motion to relieve pain, sleep interuption Timing: Constant Modifying factors: nothing currently  Erica [name] was last seen on 07/27/2017 for a procedure. During  today's appointment we reviewed Erica [name]'s post-procedure results, as well as her outpatient medication regimen. She has not been seen  in one 3 months. She admits her last Oxycodone Rx was filled in December 2018. She states that her Insurance was messed up. She has not been able to afford visits without this. She admits that she has been using BC's for pain management.   Further details on both, my assessment(s), as well as the proposed treatment plan, please see below.  Controlled Substance Pharmacotherapy Assessment REMS (Risk Evaluation and Mitigation Strategy)   Analgesic: Oxycodone IR 5 mg 1 tablet by mouth every 8 hours (15 mg/day of oxycodone) (22.5 MME/day) MME/day: 22.5 mg/day.     Erica Billow, RN  09/14/2017  1:12 PM  Sign at close encounter Nursing Pain Medication Assessment:  Safety precautions to be maintained throughout the outpatient stay will include: orient to surroundings, keep bed in low position, maintain call bell within reach at all times, provide assistance with transfer out of bed and ambulation.  Medication Inspection Compliance: Pill count conducted under aseptic conditions, in front of the patient. Neither the pills nor the bottle was removed from the patient's sight at any time. Once count was completed pills were immediately returned to the patient in their original bottle.  Medication: Oxycodone IR Pill/Patch Count: 0 of 90 pills remain Pill/Patch Appearance: Markings consistent with prescribed medication Bottle Appearance: Standard pharmacy container. Clearly labeled. Filled Date: 51 / 71 / 2018 Last Medication intake:  Ran out of medicine more than 48 hours ago Pharmacokinetics: Liberation and absorption (onset of action): WNL Distribution (time to peak effect): WNL Metabolism and excretion (duration of action): WNL         Pharmacodynamics: Desired effects: Analgesia: Erica [name] reports >50% benefit. Functional ability: Patient reports that  medication allows her to accomplish basic ADLs Clinically meaningful improvement in function (CMIF): Sustained CMIF goals met Perceived effectiveness: Described as relatively effective, allowing for increase in activities of daily living (ADL) Undesirable effects: Side-effects or Adverse reactions: None reported Monitoring: [place] PMP: Online review of the past 49-monthperiod conducted. Compliant with practice rules and regulations Last UDS on record: Summary  Date Value Ref Range Status  05/02/2017 FINAL  Final    Comment:    ==================================================================== TOXASSURE SELECT 13 (MW) ==================================================================== Test                             Result       Flag       Units Drug Present and Declared for Prescription Verification   Oxycodone                      602          EXPECTED   ng/mg creat   Oxymorphone                    801          EXPECTED   ng/mg creat   Noroxycodone                   320          EXPECTED   ng/mg creat   Noroxymorphone                 318          EXPECTED   ng/mg creat    Sources of oxycodone are scheduled prescription medications.    Oxymorphone, noroxycodone, and noroxymorphone are expected    metabolites of oxycodone. Oxymorphone is also  available as a    scheduled prescription medication. ==================================================================== Test                      Result    Flag   Units      Ref Range   Creatinine              95               mg/dL      >=20 ==================================================================== Declared Medications:  The flagging and interpretation on this report are based on the  following declared medications.  Unexpected results may arise from  inaccuracies in the declared medications.  **Note: The testing scope of this panel includes these medications:  Oxycodone (Roxicodone)  **Note: The testing scope of this panel does  not include following  reported medications:  Albuterol  Aspirin (Aspirin 81)  Atorvastatin (Lipitor)  Cholecalciferol  Diltiazem (Cardizem)  Duloxetine (Cymbalta)  Fluticasone  Folic acid (Folvite)  Furosemide (Lasix)  Losartan (Cozaar)  Magnesium Oxide  Metformin  Oxygen  Pantoprazole (Protonix)  Potassium (Klor-Con)  Pregabalin (Lyrica)  Sucralfate (Carafate)  Tiotropium (Spiriva)  Tizanidine (Zanaflex) ==================================================================== For clinical consultation, please call [phone]. ====================================================================    UDS interpretation: Compliant          Medication Assessment Form: Reviewed. Patient indicates being compliant with therapy Treatment compliance: Compliant Risk Assessment Profile: Aberrant behavior: See prior evaluations. None observed or detected today Comorbid factors increasing risk of overdose: See prior notes. No additional risks detected today Risk of substance use disorder (SUD): Low Opioid Risk Tool - 09/14/17 1310      Family History of Substance Abuse   Alcohol  Negative    Illegal Drugs  Negative    Rx Drugs  Negative      Personal History of Substance Abuse   Alcohol  Negative    Illegal Drugs  Negative    Rx Drugs  Negative      Psychological Disease   Psychological Disease  Positive    ADD  Negative    OCD  Negative    Bipolar  Negative    Schizophrenia  Negative    Depression  Positive patient taking cymbalta for depression and states that it is  working   patient taking cymbalta for depression and states that it is  working     Total Score   Opioid Risk Tool Scoring  3    Opioid Risk Interpretation  Low Risk      ORT Scoring interpretation table:  Score <3 = Low Risk for SUD  Score between 4-7 = Moderate Risk for SUD  Score >8 = High Risk for Opioid Abuse   Risk Mitigation Strategies:  Patient Counseling: Covered Patient-Prescriber  Agreement (PPA): Present and active  Notification to other healthcare providers: Done  Pharmacologic Plan: No change in therapy, at this time.             Post-Procedure Assessment  10/23/18Procedure: Hyalgan bilateral knee Pre-procedure pain score:  2/10 Post-procedure pain score: 0/10         Influential Factors: BMI: 30.41 kg/m Intra-procedural challenges: None observed.         Assessment challenges: None detected.              Reported side-effects: None.        Post-procedural adverse reactions or complications: None reported         Sedation: Please see nurses note. When no sedatives are used,  the analgesic levels obtained are directly associated to the effectiveness of the local anesthetics. However, when sedation is provided, the level of analgesia obtained during the initial 1 hour following the intervention, is believed to be the result of a combination of factors. These factors may include, but are not limited to: 1. The effectiveness of the local anesthetics used. 2. The effects of the analgesic(s) and/or anxiolytic(s) used. 3. The degree of discomfort experienced by the patient at the time of the procedure. 4. The patients ability and reliability in recalling and recording the events. 5. The presence and influence of possible secondary gains and/or psychosocial factors. Reported result: Relief experienced during the 1st hour after the procedure: 100 % (Ultra-Short Term Relief)            Interpretative annotation: Clinically appropriate result. Analgesia during this period is likely to be Local Anesthetic and/or IV Sedative (Analgesic/Anxiolytic) related.          Effects of local anesthetic: The analgesic effects attained during this period are directly associated to the localized infiltration of local anesthetics and therefore cary significant diagnostic value as to the etiological location, or anatomical origin, of the pain. Expected duration of relief is directly dependent  on the pharmacodynamics of the local anesthetic used. Long-acting (4-6 hours) anesthetics used.  Reported result: Relief during the next 4 to 6 hour after the procedure: 100 % (Short-Term Relief)            Interpretative annotation: Clinically appropriate result. Analgesia during this period is likely to be Local Anesthetic-related.          Long-term benefit: Defined as the period of time past the expected duration of local anesthetics (1 hour for short-acting and 4-6 hours for long-acting). With the possible exception of prolonged sympathetic blockade from the local anesthetics, benefits during this period are typically attributed to, or associated with, other factors such as analgesic sensory neuropraxia, antiinflammatory effects, or beneficial biochemical changes provided by agents other than the local anesthetics.  Reported result: Extended relief following procedure: 0 %(once numbing medicine stopped the pain came back) (Long-Term Relief)            Interpretative annotation: Clinically appropriate result. Good relief. No permanent benefit expected. Inflammation plays a part in the etiology to the pain.          Current benefits: Defined as reported results that persistent at this point in time.   Analgesia: <25 %            Function: Back to baseline ROM: Back to baseline Interpretative annotation: Recurrence of symptoms.                Interpretation: Results would suggest that repeating the procedure may be necessary,                  Plan:  Please see "Plan of Care" for details.                Laboratory Chemistry  Inflammation Markers (CRP: Acute Phase) (ESR: Chronic Phase) Lab Results  Component Value Date   CRP 1.3 (H) 04/30/2016   ESRSEDRATE 31 (H) 04/30/2016                         Rheumatology Markers Lab Results  Component Value Date   RF <10.0 06/29/2016   ANA Negative 06/29/2016   LABURIC 7.1 (H) 06/19/2015  Renal Function Markers Lab Results   Component Value Date   BUN 8 04/08/2017   CREATININE 0.85 04/08/2017   GFRAA >60 04/08/2017   GFRNONAA >60 04/08/2017                 Hepatic Function Markers Lab Results  Component Value Date   AST 27 06/14/2016   ALT 26 06/14/2016   ALBUMIN 4.2 06/14/2016   ALKPHOS 83 06/14/2016   LIPASE 90 02/10/2013                 Electrolytes Lab Results  Component Value Date   NA 132 (L) 04/08/2017   K 3.5 04/08/2017   CL 95 (L) 04/08/2017   CALCIUM 9.3 04/08/2017   MG 1.9 04/30/2016                        Neuropathy Markers Lab Results  Component Value Date   VITAMINB12 248 04/30/2016   HGBA1C 6.2 (H) 06/14/2015   HIV NON REACTIVE 06/14/2015                 Bone Pathology Markers Lab Results  Component Value Date   25OHVITD1 15 (L) 04/30/2016   25OHVITD2 <1.0 04/30/2016   25OHVITD3 15 04/30/2016                         Coagulation Parameters Lab Results  Component Value Date   PLT 280 04/08/2017                 Cardiovascular Markers Lab Results  Component Value Date   BNP 42.0 06/14/2016   CKTOTAL 56 03/10/2013   CKMB 0.7 03/10/2013   TROPONINI <0.03 06/14/2016   HGB 13.4 04/08/2017   HCT 39.9 04/08/2017                 CA Markers No results found for: CEA, CA125, LABCA2               Note: Lab results reviewed.  Recent Diagnostic Imaging Results  DG Lumbar Spine Complete CLINICAL DATA:  Low back pain after a fall  EXAM: LUMBAR SPINE - COMPLETE 4+ VIEW  COMPARISON:  04/30/2016  FINDINGS: Calcified pelvic phleboliths. Lumbar alignment is within normal limits. Moderate degenerative changes at L5-S1, diffuse degenerative changes elsewhere in the spine with osteophytes and mild disc space narrowing. Vertebral body heights are maintained. Atherosclerotic vascular calcification.  IMPRESSION: Mild to moderate degenerative changes. No acute osseous abnormality.  Electronically Signed   By: Donavan Foil M.D.   On: 04/08/2017 22:36 DG Chest 2  View CLINICAL DATA:  Dizziness since blood pressure medication change. Also shortness of breath several months.  EXAM: CHEST  2 VIEW  COMPARISON:  06/14/2016  FINDINGS: Lungs are adequately inflated without focal consolidation or effusion. Cardiomediastinal silhouette, bones and soft tissues are within normal.  IMPRESSION: No active cardiopulmonary disease.  Electronically Signed   By: Marin Olp M.D.   On: 04/08/2017 19:56  Complexity Note: Imaging results reviewed. Results shared with Ms. Melina Copa, using [employer].                         Meds   Current Outpatient Medications:  .  albuterol (PROAIR HFA) 108 (90 Base) MCG/ACT inhaler, Inhale into the lungs 4 (four) times daily., Disp: , Rfl:  .  aspirin EC 81 MG EC tablet, Take 1 tablet (81 mg total)  by mouth daily., Disp: 30 tablet, Rfl: 0 .  atorvastatin (LIPITOR) 80 MG tablet, Take 80 mg by mouth daily., Disp: , Rfl:  .  Cholecalciferol (VITAMIN D3) 2000 units capsule, Take 1 capsule (2,000 Units total) by mouth daily., Disp: 30 capsule, Rfl: PRN .  diltiazem (CARDIZEM CD) 120 MG 24 hr capsule, Take 1 capsule (120 mg total) by mouth daily., Disp: 30 capsule, Rfl: 11 .  diltiazem (CARDIZEM) 30 MG tablet, Take 1 tablet (30 mg total) by mouth 3 (three) times daily as needed., Disp: 90 tablet, Rfl: 3 .  DULoxetine (CYMBALTA) 30 MG capsule, Take 30 mg by mouth., Disp: , Rfl:  .  DULoxetine (CYMBALTA) 60 MG capsule, Take 60 mg by mouth daily., Disp: , Rfl:  .  Fluticasone-Salmeterol (ADVAIR DISKUS IN), Inhale into the lungs 2 (two) times daily., Disp: , Rfl:  .  folic acid (FOLVITE) 1 MG tablet, Take 1 mg by mouth daily., Disp: , Rfl:  .  furosemide (LASIX) 40 MG tablet, Take 40 mg by mouth daily. Take an additional 40 mg in the am for weight gain as needed, Disp: , Rfl:  .  losartan (COZAAR) 100 MG tablet, Take 1/2 tablet (50 mg) by mouth twice daily, Disp: , Rfl:  .  Magnesium Oxide, Antacid, 500 MG CAPS, once daily.,  Disp: , Rfl:  .  metFORMIN (GLUCOPHAGE) 500 MG tablet, Take 500 mg by mouth 2 (two) times daily with a meal., Disp: , Rfl:  .  OXYGEN, Inhale into the lungs. 2 liters at bedtime, Disp: , Rfl:  .  pantoprazole (PROTONIX) 40 MG tablet, Take 1 tablet (40 mg total) by mouth daily., Disp: 30 tablet, Rfl: 0 .  potassium chloride SA (K-DUR,KLOR-CON) 20 MEQ tablet, Take 20 mEq by mouth 2 (two) times daily., Disp: , Rfl:  .  sucralfate (CARAFATE) 1 G tablet, Take 1 tablet (1 g total) by mouth 4 (four) times daily -  with meals and at bedtime. (Patient taking differently: Take 1 g by mouth 2 (two) times daily. ), Disp: 120 tablet, Rfl: 0 .  tiotropium (SPIRIVA HANDIHALER) 18 MCG inhalation capsule, Place 18 mcg into inhaler and inhale daily., Disp: , Rfl:  .  oxyCODONE (OXY IR/ROXICODONE) 5 MG immediate release tablet, Take 1 tablet (5 mg total) by mouth every 8 (eight) hours as needed for severe pain., Disp: 90 tablet, Rfl: 0 .  oxyCODONE (OXY IR/ROXICODONE) 5 MG immediate release tablet, Take 1 tablet (5 mg total) by mouth every 8 (eight) hours as needed for severe pain., Disp: 90 tablet, Rfl: 0 .  oxyCODONE (OXY IR/ROXICODONE) 5 MG immediate release tablet, Take 1 tablet (5 mg total) by mouth every 8 (eight) hours as needed for severe pain., Disp: 90 tablet, Rfl: 0 .  pregabalin (LYRICA) 75 MG capsule, Take 1 capsule (75 mg total) by mouth 3 (three) times daily., Disp: 258 capsule, Rfl: 0 .  tiZANidine (ZANAFLEX) 4 MG capsule, Take 1 capsule (4 mg total) by mouth 3 (three) times daily as needed for muscle spasms., Disp: 90 capsule, Rfl: 2  ROS  Constitutional: Denies any fever or chills Gastrointestinal: No reported hemesis, hematochezia, vomiting, or acute GI distress Musculoskeletal: Denies any acute onset joint swelling, redness, loss of ROM, or weakness Neurological: No reported episodes of acute onset apraxia, aphasia, dysarthria, agnosia, amnesia, paralysis, loss of coordination, or loss of  consciousness  Allergies  Ms. Grandmaison is allergic to tramadol and penicillins.  [place]  Drug: Ms. Douglass  reports that she  does not use drugs. Alcohol:  reports that she does not drink alcohol. Tobacco:  reports that she quit smoking about 2 years ago. Her smoking use included cigarettes. She has a 36.00 pack-year smoking history. she has never used smokeless tobacco. Medical:  has a past medical history of (HFpEF) heart failure with preserved ejection fraction ([place]), Arthritis, Asthma, COPD (chronic obstructive pulmonary disease) ([place]), Diabetes mellitus without complication ([place]), Gastric ulcer, Hyperlipidemia, Hypertension, and OSA (obstructive sleep apnea). Surgical: Ms. Record  has a past surgical history that includes Knee surgery (Left) and Flexible bronchoscopy (N/A, 06/20/2015). Family: family history includes Breast cancer in her mother; Diabetes in her maternal grandmother; Lung cancer in her father.  Constitutional Exam  General appearance: Well nourished, well developed, and well hydrated. In no apparent acute distress Vitals:   09/14/17 1304  BP: 134/65  Pulse: 89  Resp: 16  Temp: 98 F (36.7 C)  TempSrc: Oral  SpO2: 99%  Weight: 200 lb (90.7 kg)  Height: _0  (1.727 m)  Psych/Mental status: Alert, oriented x 3 (person, place, & time)       Eyes: PERLA Respiratory: No evidence of acute respiratory distress  Cervical Spine Area Exam  Skin & Axial Inspection: No masses, redness, edema, swelling, or associated skin lesions Alignment: Symmetrical Functional ROM: Unrestricted ROM      Stability: No instability detected Muscle Tone/Strength: Functionally intact. No obvious neuro-muscular anomalies detected. Sensory (Neurological): Unimpaired Palpation: No palpable anomalies              Upper Extremity (UE) Exam    Side: Right upper extremity  Side: Left upper extremity  Skin & Extremity Inspection: Skin color, temperature, and hair growth are WNL. No peripheral edema or  cyanosis. No masses, redness, swelling, asymmetry, or associated skin lesions. No contractures.  Skin & Extremity Inspection: Skin color, temperature, and hair growth are WNL. No peripheral edema or cyanosis. No masses, redness, swelling, asymmetry, or associated skin lesions. No contractures.  Functional ROM: Unrestricted ROM          Functional ROM: Unrestricted ROM          Muscle Tone/Strength: Functionally intact. No obvious neuro-muscular anomalies detected.  Muscle Tone/Strength: Functionally intact. No obvious neuro-muscular anomalies detected.  Sensory (Neurological): Unimpaired          Sensory (Neurological): Unimpaired          Palpation: No palpable anomalies              Palpation: No palpable anomalies              Specialized Test(s): Deferred         Specialized Test(s): Deferred          Thoracic Spine Area Exam  Skin & Axial Inspection: No masses, redness, or swelling Alignment: Symmetrical Functional ROM: Unrestricted ROM Stability: No instability detected Muscle Tone/Strength: Functionally intact. No obvious neuro-muscular anomalies detected. Sensory (Neurological): Unimpaired Muscle strength & Tone: No palpable anomalies  Lumbar Spine Area Exam  Skin & Axial Inspection: No masses, redness, or swelling Alignment: Symmetrical Functional ROM: Unrestricted ROM      Stability: No instability detected Muscle Tone/Strength: Functionally intact. No obvious neuro-muscular anomalies detected. Sensory (Neurological): Unimpaired Palpation: No palpable anomalies       Provocative Tests: Lumbar Hyperextension and rotation test: evaluation deferred today       Lumbar Lateral bending test: evaluation deferred today       Patrick's Maneuver: evaluation deferred today  Gait & Posture Assessment  Ambulation: Unassisted Gait: Relatively normal for age and body habitus Posture: WNL   Lower Extremity Exam    Side: Right lower extremity  Side: Left lower extremity   Skin & Extremity Inspection: Skin color, temperature, and hair growth are WNL. No peripheral edema or cyanosis. No masses, redness, swelling, asymmetry, or associated skin lesions. No contractures.  Skin & Extremity Inspection: Skin color, temperature, and hair growth are WNL. No peripheral edema or cyanosis. No masses, redness, swelling, asymmetry, or associated skin lesions. No contractures.  Functional ROM: Adequate ROM          Functional ROM: Adequate ROM          Muscle Tone/Strength: Functionally intact. No obvious neuro-muscular anomalies detected.  Muscle Tone/Strength: Functionally intact. No obvious neuro-muscular anomalies detected.  Sensory (Neurological): Unimpaired  Sensory (Neurological): Unimpaired  Palpation: Uncomfortable  Palpation: Uncomfortable   Assessment  Primary Diagnosis & Pertinent Problem List: The primary encounter diagnosis was Chronic knee pain [facility] source of pain) (Bilateral) (L>R). Diagnoses of Chronic sacroiliac joint pain (Bilateral) (R>L), Chronic low back pain (Primary Source of Pain) (Bilateral) (L>R), Chronic pain syndrome, and Lumbar spondylosis were also pertinent to this visit.  Status Diagnosis  Controlled Controlled Controlled 1. Chronic knee pain [facility] source of pain) (Bilateral) (L>R)   2. Chronic sacroiliac joint pain (Bilateral) (R>L)   3. Chronic low back pain (Primary Source of Pain) (Bilateral) (L>R)   4. Chronic pain syndrome   5. Lumbar spondylosis     Problems updated and reviewed during this visit: No problems updated. Plan of Care  Pharmacotherapy (Medications Ordered): No orders of the defined types were placed in this encounter.  New Prescriptions   No medications on file   Medications administered today: Leonarda A. Dlugosz had no medications administered during this visit. Lab-work, procedure(s), and/or referral(s): No orders of the defined types were placed in this encounter.  Imaging and/or  referral(s): None  Interventional therapies: Planned, scheduled, and/or pending:  Bilateral Hyalgan knee injection   Considering:  Diagnostic bilateral lumbar facet block #3 Possible bilateral lumbar facet radiofrequency ablation  Possible bilateral diagnostic sacroiliac joint block  Possible bilateral intra-articular hip joint injection  Possible bilateral intra-articular knee joint injection Possible bilateral genicular nerve block  Possible bilateral knee joint radiofrequency ablation.  Possible left-sided lumbar epidural steroid injection Possible right-sided cervical epidural steroid injection Possible bilateral diagnostic cervical facet block Possible bilateral diagnostic greater occipital nerve blocks Possible greater occipital nerve radiofrequency ablation.   Palliative PRN treatment(s):  Palliative bilateral lumbar facet radiofrequency ablation       Provider-requested follow-up: Return in about 3 months (around 12/12/2017) for MedMgmt with Me Donella Stade Edison Pace).  Future Appointments  Date Time Provider [place]  09/15/2017  1:20 PM Alisa Graff, [place] [facility] None   Primary Care Physician: Denton Lank, MD Location: [facility] Facility Note by: Vevelyn Francois NP Date: 09/14/2017; Time: 1:31 PM  Pain Score Disclaimer: We use the NRS-11 scale. This is a self-reported, subjective measurement of pain severity with only modest accuracy. It is used primarily to identify changes within a particular patient. It must be understood that outpatient pain scales are significantly less accurate that those used for research, where they can be applied under ideal controlled circumstances with minimal exposure to variables. In reality, the score is likely to be a combination of pain intensity and pain affect, where pain affect describes the degree of emotional arousal or changes in action readiness caused  by the sensory experience of pain. Factors such  as social and work situation, setting, emotional state, anxiety levels, expectation, and prior pain experience may influence pain perception and show large inter-individual differences that may also be affected by time variables.  Patient instructions provided during this appointment: Patient Instructions  ____________________________________________________________________________________________  Medication Rules  Applies to: All patients receiving prescriptions (written or electronic).  Pharmacy of record: Pharmacy where electronic prescriptions will be sent. If written prescriptions are taken to a different pharmacy, please inform the nursing staff. The pharmacy listed in the electronic medical record should be the one where you would like electronic prescriptions to be sent.  Prescription refills: Only during scheduled appointments. Applies to both, written and electronic prescriptions.  NOTE: The following applies primarily to controlled substances (Opioid* Pain Medications).   Patient's responsibilities: 1. Pain Pills: Bring all pain pills to every appointment (except for procedure appointments). 2. Pill Bottles: Bring pills in original pharmacy bottle. Always bring newest bottle. Bring bottle, even if empty. 3. Medication refills: You are responsible for knowing and keeping track of what medications you need refilled. The day before your appointment, write a list of all prescriptions that need to be refilled. Bring that list to your appointment and give it to the admitting nurse. Prescriptions will be written only during appointments. If you forget a medication, it will not be "Called in", "Faxed", or "electronically sent". You will need to get another appointment to get these prescribed. 4. Prescription Accuracy: You are responsible for carefully inspecting your prescriptions before leaving our office. Have the discharge nurse carefully go over each prescription with you, before taking  them home. Make sure that your name is accurately spelled, that your address is correct. Check the name and dose of your medication to make sure it is accurate. Check the number of pills, and the written instructions to make sure they are clear and accurate. Make sure that you are given enough medication to last until your next medication refill appointment. 5. Taking Medication: Take medication as prescribed. Never take more pills than instructed. Never take medication more frequently than prescribed. Taking less pills or less frequently is permitted and encouraged, when it comes to controlled substances (written prescriptions).  6. Inform other Doctors: Always inform, all of your healthcare providers, of all the medications you take. 7. Pain Medication from other Providers: You are not allowed to accept any additional pain medication from any other Doctor or Healthcare provider. There are two exceptions to this rule. (see below) In the event that you require additional pain medication, you are responsible for notifying us, as stated below. 8. Medication Agreement: You are responsible for carefully reading and following our Medication Agreement. This must be signed before receiving any prescriptions from our practice. Safely store a copy of your signed Agreement. Violations to the Agreement will result in no further prescriptions. (Additional copies of our Medication Agreement are available upon request.) 9. Laws, Rules, & Regulations: All patients are expected to follow all Federal and [employer], Rules, [employer]. Ignorance of the Laws does not constitute a valid excuse. The use of any illegal substances is prohibited. 10. Adopted CDC guidelines & recommendations: Target dosing levels will be at or below 60 MME/day. Use of benzodiazepines** is not recommended.  Exceptions: There are only two exceptions to the rule of not receiving pain medications from other Healthcare  Providers. 1. Exception #1 (Emergencies): In the event of an emergency (i.e.: accident requiring emergency care), you are allowed  to receive additional pain medication. However, you are responsible for: As soon as you are able, call our office [phone], at any time of the day or night, and leave a message stating your name, the date and nature of the emergency, and the name and dose of the medication prescribed. In the event that your call is answered by a member of our staff, make sure to document and save the date, time, and the name of the person that took your information.  2. Exception #2 (Planned Surgery): In the event that you are scheduled by another doctor or dentist to have any type of surgery or procedure, you are allowed (for a period no longer than 30 days), to receive additional pain medication, for the acute post-op pain. However, in this case, you are responsible for picking up a copy of our "Post-op Pain Management for Surgeons" handout, and giving it to your surgeon or dentist. This document is available at our office, and does not require an appointment to obtain it. Simply go to our office during business hours (Monday-Thursday from 8:00 AM to 4:00 PM) (Friday 8:00 AM to 12:00 Noon) or if you have a scheduled appointment with [place], prior to your surgery, and ask for it by name. In addition, you will need to provide [place] with your name, name of your surgeon, type of surgery, and date of procedure or surgery.  *Opioid medications include: morphine, codeine, oxycodone, oxymorphone, hydrocodone, hydromorphone, meperidine, tramadol, tapentadol, buprenorphine, fentanyl, methadone. **Benzodiazepine medications include: diazepam (Valium), alprazolam (Xanax), clonazepam (Klonopine), lorazepam (Ativan), clorazepate (Tranxene), chlordiazepoxide (Librium), estazolam (Prosom), oxazepam (Serax), temazepam (Restoril), triazolam  (Halcion) ____________________________________________________________________________________________

## 2017-09-14 NOTE — Progress Notes (Signed)
Nursing Pain Medication Assessment:  Safety precautions to be maintained throughout the outpatient stay will include: orient to surroundings, keep bed in low position, maintain call bell within reach at all times, provide assistance with transfer out of bed and ambulation.  Medication Inspection Compliance: Pill count conducted under aseptic conditions, in front of the patient. Neither the pills nor the bottle was removed from the patient's sight at any time. Once count was completed pills were immediately returned to the patient in their original bottle.  Medication: Oxycodone IR Pill/Patch Count: 0 of 90 pills remain Pill/Patch Appearance: Markings consistent with prescribed medication Bottle Appearance: Standard pharmacy container. Clearly labeled. Filled Date: 4912 / 3930 / 2018 Last Medication intake:  Ran out of medicine more than 48 hours ago

## 2017-09-15 ENCOUNTER — Encounter: Payer: Self-pay | Admitting: Family

## 2017-09-15 ENCOUNTER — Ambulatory Visit: Payer: Medicaid Other | Attending: Family | Admitting: Family

## 2017-09-15 VITALS — BP 129/63 | HR 88 | Resp 18 | Ht 68.0 in | Wt 202.1 lb

## 2017-09-15 DIAGNOSIS — Z79899 Other long term (current) drug therapy: Secondary | ICD-10-CM | POA: Insufficient documentation

## 2017-09-15 DIAGNOSIS — I272 Pulmonary hypertension, unspecified: Secondary | ICD-10-CM | POA: Diagnosis not present

## 2017-09-15 DIAGNOSIS — Z7982 Long term (current) use of aspirin: Secondary | ICD-10-CM | POA: Insufficient documentation

## 2017-09-15 DIAGNOSIS — R42 Dizziness and giddiness: Secondary | ICD-10-CM | POA: Diagnosis not present

## 2017-09-15 DIAGNOSIS — I10 Essential (primary) hypertension: Secondary | ICD-10-CM

## 2017-09-15 DIAGNOSIS — E785 Hyperlipidemia, unspecified: Secondary | ICD-10-CM | POA: Insufficient documentation

## 2017-09-15 DIAGNOSIS — R531 Weakness: Secondary | ICD-10-CM | POA: Diagnosis not present

## 2017-09-15 DIAGNOSIS — Z79891 Long term (current) use of opiate analgesic: Secondary | ICD-10-CM | POA: Insufficient documentation

## 2017-09-15 DIAGNOSIS — Z87891 Personal history of nicotine dependence: Secondary | ICD-10-CM | POA: Insufficient documentation

## 2017-09-15 DIAGNOSIS — R51 Headache: Secondary | ICD-10-CM | POA: Insufficient documentation

## 2017-09-15 DIAGNOSIS — J449 Chronic obstructive pulmonary disease, unspecified: Secondary | ICD-10-CM | POA: Diagnosis not present

## 2017-09-15 DIAGNOSIS — I5032 Chronic diastolic (congestive) heart failure: Secondary | ICD-10-CM | POA: Insufficient documentation

## 2017-09-15 DIAGNOSIS — R0602 Shortness of breath: Secondary | ICD-10-CM | POA: Diagnosis not present

## 2017-09-15 DIAGNOSIS — R05 Cough: Secondary | ICD-10-CM | POA: Insufficient documentation

## 2017-09-15 DIAGNOSIS — Z833 Family history of diabetes mellitus: Secondary | ICD-10-CM | POA: Diagnosis not present

## 2017-09-15 DIAGNOSIS — Z803 Family history of malignant neoplasm of breast: Secondary | ICD-10-CM | POA: Diagnosis not present

## 2017-09-15 DIAGNOSIS — E119 Type 2 diabetes mellitus without complications: Secondary | ICD-10-CM

## 2017-09-15 DIAGNOSIS — Z880 Allergy status to penicillin: Secondary | ICD-10-CM | POA: Diagnosis not present

## 2017-09-15 DIAGNOSIS — G4733 Obstructive sleep apnea (adult) (pediatric): Secondary | ICD-10-CM | POA: Insufficient documentation

## 2017-09-15 DIAGNOSIS — I110 Hypertensive heart disease with heart failure: Secondary | ICD-10-CM | POA: Insufficient documentation

## 2017-09-15 DIAGNOSIS — Z888 Allergy status to other drugs, medicaments and biological substances status: Secondary | ICD-10-CM | POA: Insufficient documentation

## 2017-09-15 DIAGNOSIS — R079 Chest pain, unspecified: Secondary | ICD-10-CM | POA: Diagnosis not present

## 2017-09-15 DIAGNOSIS — Z7984 Long term (current) use of oral hypoglycemic drugs: Secondary | ICD-10-CM | POA: Insufficient documentation

## 2017-09-15 DIAGNOSIS — Z801 Family history of malignant neoplasm of trachea, bronchus and lung: Secondary | ICD-10-CM | POA: Diagnosis not present

## 2017-09-15 DIAGNOSIS — Z8719 Personal history of other diseases of the digestive system: Secondary | ICD-10-CM | POA: Diagnosis not present

## 2017-09-15 DIAGNOSIS — Z9889 Other specified postprocedural states: Secondary | ICD-10-CM | POA: Diagnosis not present

## 2017-09-15 LAB — GLUCOSE, CAPILLARY: Glucose-Capillary: 158 mg/dL — ABNORMAL HIGH (ref 65–99)

## 2017-09-15 NOTE — Patient Instructions (Addendum)
Continue weighing daily and call for an overnight weight gain of > 2 pounds or a weekly weight gain of >5 pounds.  Increase furosemide to 40mg twice daily.  

## 2017-09-15 NOTE — Progress Notes (Signed)
Patient ID: Erica [name], female    DOB: [birth year], 52 y.o.   MRN: [id number]  HPI  Erica [name] is a 52 y/o female with a history of obstructive sleep apnea, HTN, hyperlipidemia, COPD, asthma, pulmonary HTN, chronic tobacco use and chronic heart failure.  Last echo was done on 06/14/15 and showed an EF of 60-65% with moderately elevated PA pressure at 56 mm Hg. Last PFT's were done 04/19/16. Recently had a chest CT done on 04/27/16 due to ground glass infiltrate.   Was in the ED 04/08/17 due to back pain and dizziness. She was evaluated and released.  She presents today for a follow-up visit with a chief complaint of moderate fatigue upon minimal exertion. This has been chronic in nature. She has associated cough, shortness of breath, dizziness, headaches, weakness, intermittent chest pain, edema and difficulty sleeping. She denies any abdominal distention. She says that she's been treated with antibiotics without any improvement in her cough.   Past Medical History:  Diagnosis Date  . Arthritis   . Asthma   . CHF (congestive heart failure) (HCC)   . COPD (chronic obstructive pulmonary disease) (HCC)   . Gastric ulcer   . Hyperlipidemia   . Hypertension   . OSA on CPAP no ins - no cpap machine for the last 4 years   Past Surgical History:  Procedure Laterality Date  . FLEXIBLE BRONCHOSCOPY N/A 06/20/2015   Procedure: FLEXIBLE BRONCHOSCOPY;  Surgeon: Shane CrutchPradeep Ramachandran, MD;  Location: [facility] ORS;  Service: Pulmonary;  Laterality: N/A;  . KNEE SURGERY Left    8 knee surgeries   Family History  Problem Relation Age of Onset  . Lung cancer Father   . Breast cancer Mother        early 6660's  . Diabetes Maternal Grandmother    Social History  Substance Use Topics  . Smoking status: Former Smoker    Packs/day: 1.00    Years: 36.00    Types: Cigarettes    Quit date: 06/13/2015  . Smokeless tobacco: Never Used  . Alcohol use 0.0 oz/week     Comment: occasionaly   Allergies   Allergen Reactions  . Tramadol Other (See Comments)    Heart palpatations  . Penicillins Rash   Prior to Admission medications   Medication Sig Start Date End Date Taking? Authorizing Provider  albuterol (PROAIR HFA) 108 (90 Base) MCG/ACT inhaler Inhale into the lungs 4 (four) times daily.   Yes [provider]  aspirin EC 81 MG EC tablet Take 1 tablet (81 mg total) by mouth daily. 06/23/15  Yes Shaune Pollackhen, Qing, MD  atorvastatin (LIPITOR) 80 MG tablet Take 80 mg by mouth daily.   Yes [provider]  Cholecalciferol (VITAMIN D3) 2000 units capsule Take 1 capsule (2,000 Units total) by mouth daily. 09/14/17  Yes Barbette MerinoKing, Crystal M, NP  diltiazem (CARDIZEM CD) 120 MG 24 hr capsule Take 1 capsule (120 mg total) by mouth daily. 12/23/16  Yes Gollan, Tollie Pizzaimothy J, MD  diltiazem (CARDIZEM) 30 MG tablet Take 1 tablet (30 mg total) by mouth 3 (three) times daily as needed. 03/23/17  Yes Gollan, Tollie Pizzaimothy J, MD  DULoxetine (CYMBALTA) 30 MG capsule Take 30 mg by mouth.   Yes [provider]  DULoxetine (CYMBALTA) 60 MG capsule Take 60 mg by mouth daily.   Yes [provider]  Fluticasone-Salmeterol (ADVAIR DISKUS IN) Inhale into the lungs 2 (two) times daily.   Yes [provider]  folic acid (FOLVITE) 1 MG  tablet Take 1 mg by mouth daily.   Yes [provider]  furosemide (LASIX) 40 MG tablet Take 40 mg by mouth daily. Take an additional 40 mg in the am for weight gain as needed   Yes [provider]  losartan (COZAAR) 100 MG tablet Take 1/2 tablet (50 mg) by mouth twice daily   Yes [provider]  Magnesium Oxide, Antacid, 500 MG CAPS once daily. 11/08/16  Yes [provider]  metFORMIN (GLUCOPHAGE) 500 MG tablet Take 500 mg by mouth 2 (two) times daily with a meal.   Yes [provider]  oxyCODONE (OXY IR/ROXICODONE) 5 MG immediate release tablet Take 1 tablet (5 mg total) by mouth every 8 (eight) hours as needed for severe pain.  09/14/17 10/14/17 Yes Barbette Merino, NP  OXYGEN Inhale into the lungs. 2 liters at bedtime   Yes [provider]  pantoprazole (PROTONIX) 40 MG tablet Take 1 tablet (40 mg total) by mouth daily. 06/23/15  Yes Shaune Pollack, MD  potassium chloride SA (K-DUR,KLOR-CON) 20 MEQ tablet Take 20 mEq by mouth 2 (two) times daily.   Yes [provider]  pregabalin (LYRICA) 75 MG capsule Take 1 capsule (75 mg total) by mouth 3 (three) times daily. 09/14/17 10/14/17 Yes King, Shana Chute, NP  sucralfate (CARAFATE) 1 G tablet Take 1 tablet (1 g total) by mouth 4 (four) times daily -  with meals and at bedtime. Patient taking differently: Take 1 g by mouth 2 (two) times daily.  06/23/15  Yes Shaune Pollack, MD  tiotropium (SPIRIVA HANDIHALER) 18 MCG inhalation capsule Place 18 mcg into inhaler and inhale daily.   Yes [provider]  tiZANidine (ZANAFLEX) 4 MG capsule Take 1 capsule (4 mg total) by mouth 3 (three) times daily as needed for muscle spasms. 09/14/17 10/14/17 Yes Barbette Merino, NP  oxyCODONE (OXY IR/ROXICODONE) 5 MG immediate release tablet Take 1 tablet (5 mg total) by mouth every 8 (eight) hours as needed for severe pain. 05/18/17 06/17/17  Delano Metz, MD  oxyCODONE (OXY IR/ROXICODONE) 5 MG immediate release tablet Take 1 tablet (5 mg total) by mouth every 8 (eight) hours as needed for severe pain. 06/17/17 07/17/17  Delano Metz, MD   Review of Systems  Constitutional: Positive for fatigue. Negative for appetite change.  HENT: Positive for sinus pressure. Negative for congestion and postnasal drip.   Eyes: Positive for visual disturbance (blurry vision).  Respiratory: Positive for cough and shortness of breath. Negative for chest tightness.   Cardiovascular: Positive for chest pain (at times) and leg swelling. Negative for palpitations.  Gastrointestinal: Negative for abdominal distention and abdominal pain.  Endocrine: Negative.   Genitourinary: Negative.    Musculoskeletal: Positive for back pain. Negative for neck stiffness.  Skin: Negative.   Allergic/Immunologic: Negative.   Neurological: Positive for dizziness (with position changes), weakness ("off-balance at times") and headaches (at times). Negative for light-headedness.  Hematological: Negative for adenopathy. Does not bruise/bleed easily.  Psychiatric/Behavioral: Positive for sleep disturbance (wearing oxygen at bedtime at 2L and PRN during the day). Negative for dysphoric mood. The patient is not nervous/anxious.    Vitals:   09/15/17 1326  BP: 129/63  Pulse: 88  Resp: 18  SpO2: 99%  Weight: 202 lb 2 oz (91.7 kg)  Height: 5\' 8"  (1.727 m)   Wt Readings from Last 3 Encounters:  09/15/17 202 lb 2 oz (91.7 kg)  09/14/17 200 lb (90.7 kg)  09/08/17 203 lb 4 oz (92.2 kg)  Lab Results  Component Value Date   CREATININE 0.85 04/08/2017   CREATININE 0.95 06/14/2016   CREATININE 0.65 04/22/2016   Physical Exam  Constitutional: She is oriented to person, place, and time. She appears well-developed and well-nourished.  HENT:  Head: Normocephalic and atraumatic.  Neck: Normal range of motion. Neck supple. No JVD present.  Cardiovascular: Normal rate and regular rhythm.  Pulmonary/Chest: Effort normal. She has no wheezes. She has no rales.  Abdominal: Soft. She exhibits no distension. There is no tenderness.  Musculoskeletal: She exhibits no edema or tenderness.  Neurological: She is alert and oriented to person, place, and time.  Skin: Skin is warm and dry.  Psychiatric: She has a normal mood and affect. Her behavior is normal. Thought content normal.  Nursing note and vitals reviewed.     Assessment & Plan:  1: Chronic heart failure with preserved ejection fraction- - NYHA class III - euvolemic - weighing daily; reminded to call for an overnight weight gain of >2 pounds or a weekly weight gain of >5 pounds - not adding any salt to her food - has been trying to drink more  water but still occasionally drinks pepsi - Unable to wear CPAP as it causes panic attacks. Does wear oxygen at 2L at bedtime - saw cardiology Brion Aliment) 09/08/17 & losartan increased to 50mg  BID - will need to get echocardiogram re-scheduled as she's still having transportation issues - she has to also reschedule her stress test - patient has had a chronic cough for months and no improvement with antibiotics, steroids or GI medications.  - will try increasing her furosemide to 40mg  twice daily to see if the cough could be related to fluid retention  2: Pulmonary HTN- - Saw pulmonologist Meredeth Ide) 07/29/17 - Wearing oxygent at 2L at bedtime  3: Diabetes- - still doesn't have a glucometer; is planning on getting RX from her PCP - nonfasting glucose in clinic was 158 - has heard from [facility] but has to postpone due to transportation issues  4: HTN- - Checks blood pressure at home. Currently taking Cardizem CD 120mg  daily with an additional Cardizem 30mg  prn.  - still checking her BP at home and says that it usually runs 100-180's/60-upper 90's - advised her to bring BP readings to her next appointment - BMP from 04/08/17 reviewed and showed sodium 132, potassium 3.5 and GFR >60  5: Vertigo- - she says that she gets dizzy when she turns her head quickly from side to side, looks up or changes directions quickly - doesn't notice it much when changes positions - since patient has medicaid, advised her that her PCP would need to make ENT referral for an evaluation  Patient did not bring her medications nor a list. Each medication was verbally reviewed with the patient and she was encouraged to bring the bottles to every visit to confirm accuracy of list.  Return next week for a recheck as well as check labs since increasing her diuretic to twice daily.

## 2017-09-18 LAB — TOXASSURE SELECT 13 (MW), URINE

## 2017-09-20 ENCOUNTER — Telehealth: Payer: Self-pay | Admitting: Cardiovascular Disease

## 2017-09-20 NOTE — Telephone Encounter (Signed)
Patient calling to reschedule Erica [name] that was originally for 2/25 Please call to discuss

## 2017-09-20 NOTE — Telephone Encounter (Signed)
Left voicemail message to call back  

## 2017-09-21 DIAGNOSIS — R42 Dizziness and giddiness: Secondary | ICD-10-CM | POA: Insufficient documentation

## 2017-09-21 NOTE — Progress Notes (Signed)
Patient ID: Erica AsalDonna A [name], female    DOB: 07-16-1966, 52 y.o.   MRN: [id number]  HPI  Ms Erica [name] is a 52 y/o female with a history of obstructive sleep apnea, HTN, hyperlipidemia, COPD, asthma, pulmonary HTN, chronic tobacco use and chronic heart failure.  Last echo was done on 06/14/15 and showed an EF of 60-65% with moderately elevated PA pressure at 56 mm Hg. Last PFT's were done 04/19/16. Recently had a chest CT done on 04/27/16 due to ground glass infiltrate.   Was in the ED 04/08/17 due to back pain and dizziness. She was evaluated and released.  She presents today for a follow-up visit with a chief complaint of moderate fatigue upon minimal exertion. She describes this as chronic in nature having been present for several years. She has associated cough, shortness of breath, dizziness, headache, weakness, chest pain, decreased urinary frequency and chronic difficulty sleeping. She denies any abdominal distention, palpitations or pedal edema. She says the cough has continued even with increase of furosemide.   Past Medical History:  Diagnosis Date  . Arthritis   . Asthma   . CHF (congestive heart failure) (HCC)   . COPD (chronic obstructive pulmonary disease) (HCC)   . Gastric ulcer   . Hyperlipidemia   . Hypertension   . OSA on CPAP no ins - no cpap machine for the last 4 years   Past Surgical History:  Procedure Laterality Date  . FLEXIBLE BRONCHOSCOPY N/A 06/20/2015   Procedure: FLEXIBLE BRONCHOSCOPY;  Surgeon: Shane CrutchPradeep Ramachandran, MD;  Location: [facility];  Service: Pulmonary;  Laterality: N/A;  . KNEE SURGERY Left    8 knee surgeries   Family History  Problem Relation Age of Onset  . Lung cancer Father   . Breast cancer Mother        early 1660's  . Diabetes Maternal Grandmother    Social History  Substance Use Topics  . Smoking status: Former Smoker    Packs/day: 1.00    Years: 36.00    Types: Cigarettes    Quit date: 06/13/2015  . Smokeless tobacco: Never Used  .  Alcohol use 0.0 oz/week     Comment: occasionaly   Allergies  Allergen Reactions  . Tramadol Other (See Comments)    Heart palpatations  . Penicillins Rash   Prior to Admission medications   Medication Sig Start Date End Date Taking? Authorizing Provider  albuterol (PROAIR HFA) 108 (90 Base) MCG/ACT inhaler Inhale into the lungs 4 (four) times daily.   Yes [provider]  aspirin EC 81 MG EC tablet Take 1 tablet (81 mg total) by mouth daily. 06/23/15  Yes Shaune Pollackhen, Qing, MD  atorvastatin (LIPITOR) 80 MG tablet Take 80 mg by mouth daily.   Yes [provider]  Cholecalciferol (VITAMIN D3) 2000 units capsule Take 1 capsule (2,000 Units total) by mouth daily. 09/14/17  Yes Barbette MerinoKing, Crystal M, NP  diltiazem (CARDIZEM CD) 120 MG 24 hr capsule Take 1 capsule (120 mg total) by mouth daily. 12/23/16  Yes Gollan, Tollie Pizzaimothy J, MD  diltiazem (CARDIZEM) 30 MG tablet Take 1 tablet (30 mg total) by mouth 3 (three) times daily as needed. 03/23/17  Yes Gollan, Tollie Pizzaimothy J, MD  DULoxetine (CYMBALTA) 30 MG capsule Take 30 mg by mouth.   Yes [provider]  DULoxetine (CYMBALTA) 60 MG capsule Take 60 mg by mouth daily.   Yes [provider]  Fluticasone-Salmeterol (ADVAIR DISKUS IN) Inhale into the lungs 2 (two) times daily.  Yes [provider]  folic acid (FOLVITE) 1 MG tablet Take 1 mg by mouth daily.   Yes [provider]  furosemide (LASIX) 40 MG tablet Take 40 mg by mouth 2 (two) times daily. Take an additional 40 mg in the am for weight gain as needed   Yes [provider]  losartan (COZAAR) 100 MG tablet Take 1/2 tablet (50 mg) by mouth twice daily   Yes [provider]  Magnesium Oxide, Antacid, 500 MG CAPS once daily. 11/08/16  Yes [provider]  metFORMIN (GLUCOPHAGE) 500 MG tablet Take 500 mg by mouth 2 (two) times daily with a meal.   Yes [provider]  oxyCODONE (OXY IR/ROXICODONE) 5 MG immediate release tablet Take 1  tablet (5 mg total) by mouth every 8 (eight) hours as needed for severe pain. 09/14/17 10/14/17 Yes Barbette Merino, NP  OXYGEN Inhale into the lungs. 2 liters at bedtime   Yes [provider]  pantoprazole (PROTONIX) 40 MG tablet Take 1 tablet (40 mg total) by mouth daily. 06/23/15  Yes Shaune Pollack, MD  potassium chloride SA (K-DUR,KLOR-CON) 20 MEQ tablet Take 20 mEq by mouth 2 (two) times daily.   Yes [provider]  pregabalin (LYRICA) 75 MG capsule Take 1 capsule (75 mg total) by mouth 3 (three) times daily. 09/14/17 10/14/17 Yes King, Shana Chute, NP  sucralfate (CARAFATE) 1 G tablet Take 1 tablet (1 g total) by mouth 4 (four) times daily -  with meals and at bedtime. Patient taking differently: Take 1 g by mouth 2 (two) times daily.  06/23/15  Yes Shaune Pollack, MD  tiotropium (SPIRIVA HANDIHALER) 18 MCG inhalation capsule Place 18 mcg into inhaler and inhale daily.   Yes [provider]  tiZANidine (ZANAFLEX) 4 MG capsule Take 1 capsule (4 mg total) by mouth 3 (three) times daily as needed for muscle spasms. 09/14/17 10/14/17 Yes Barbette Merino, NP  oxyCODONE (OXY IR/ROXICODONE) 5 MG immediate release tablet Take 1 tablet (5 mg total) by mouth every 8 (eight) hours as needed for severe pain. 05/18/17 06/17/17  Delano Metz, MD  oxyCODONE (OXY IR/ROXICODONE) 5 MG immediate release tablet Take 1 tablet (5 mg total) by mouth every 8 (eight) hours as needed for severe pain. 06/17/17 07/17/17  Delano Metz, MD   Review of Systems  Constitutional: Positive for fatigue. Negative for appetite change and fever.  HENT: Positive for congestion and sinus pressure. Negative for postnasal drip.   Eyes: Positive for visual disturbance (blurry vision).  Respiratory: Positive for cough and shortness of breath. Negative for chest tightness.   Cardiovascular: Positive for chest pain (at times). Negative for palpitations and leg swelling.  Gastrointestinal: Negative for abdominal  distention and abdominal pain.  Endocrine: Negative.   Genitourinary: Positive for decreased urine volume. Negative for hematuria.  Musculoskeletal: Positive for back pain (low back pain). Negative for neck stiffness.  Skin: Negative.   Allergic/Immunologic: Negative.   Neurological: Positive for dizziness (with position changes), weakness ("off-balance at times") and headaches (at times). Negative for light-headedness.  Hematological: Negative for adenopathy. Does not bruise/bleed easily.  Psychiatric/Behavioral: Positive for sleep disturbance (wearing oxygen at bedtime at 2L and PRN during the day). Negative for dysphoric mood. The patient is not nervous/anxious.    Vitals:   09/22/17 1334  BP: 119/65  Pulse: 87  Resp: 18  SpO2: 97%  Weight: 206 lb (93.4 kg)  Height: 5\' 8"  (1.727 m)   Wt Readings from Last 3 Encounters:  09/22/17 206 lb (93.4 kg)  09/15/17 202 lb 2 oz (91.7 kg)  09/14/17 200 lb (90.7 kg)   Lab Results  Component Value Date   CREATININE 0.87 09/22/2017   CREATININE 0.85 04/08/2017   CREATININE 0.95 06/14/2016   Physical Exam  Constitutional: She is oriented to person, place, and time. She appears well-developed and well-nourished.  HENT:  Head: Normocephalic and atraumatic.  Neck: Normal range of motion. Neck supple. No JVD present.  Cardiovascular: Normal rate and regular rhythm.  Pulmonary/Chest: Effort normal. She has no wheezes. She has no rales.  Abdominal: Soft. She exhibits no distension. There is no tenderness.  Musculoskeletal: She exhibits no edema or tenderness.  Neurological: She is alert and oriented to person, place, and time.  Skin: Skin is warm and dry.  Psychiatric: She has a normal mood and affect. Her behavior is normal. Thought content normal.  Nursing note and vitals reviewed.    Assessment & Plan:  1: Chronic heart failure with preserved ejection fraction- - NYHA class III - euvolemic - weighing daily; reminded to call for an  overnight weight gain of >2 pounds or a weekly weight gain of >5 pounds - weight up 4 pounds since diuretic increased - not adding any salt to her food - has been trying to drink more water but still occasionally drinks pepsi - Unable to wear CPAP as it causes panic attacks. Does wear oxygen at 2L at bedtime - saw cardiology Brion Aliment) 09/08/17 & losartan increased to 50mg  BID - will need to get echocardiogram re-scheduled and she would like to wait until next month - stress test has been rescheduled - patient has had a chronic cough for months and has had no improvement with antibiotics, steroids, anti-reflux meds or increase in diuretic - waiting on referral from her PCP to get back to ENT - will get BMP today due to diuretic increase  2: Pulmonary HTN- - Saw pulmonologist Meredeth Ide) 07/29/17 - Wearing oxygent at 2L at bedtime  3: Diabetes- - still doesn't have a glucometer; is planning on getting RX from her PCP - has heard from [facility] but has to postpone due to transportation issues  4: HTN- - Checks blood pressure at home. Currently taking Cardizem CD 120mg  daily with an additional Cardizem 30mg  prn.  - still checking her BP at home and BP log reviewed; showed readings of 106-148/83-101 - BMP from 04/08/17 reviewed and showed sodium 132, potassium 3.5 and GFR >60  5: Decreased urination- - Patient notes decreased urination and decreased flow  - denies fever, dysuria or frequency - will get a UA today to rule out UTI - if symptoms persist, may need urology referral.  Patient did not bring her medications nor a list. Each medication was verbally reviewed with the patient and she was encouraged to bring the bottles to every visit to confirm accuracy of list.  Return in 3 months or sooner for any questions/problems before then.

## 2017-09-21 NOTE — Telephone Encounter (Signed)
Patient returning call.

## 2017-09-22 ENCOUNTER — Encounter: Payer: Self-pay | Admitting: Family

## 2017-09-22 ENCOUNTER — Ambulatory Visit: Payer: Medicaid Other | Attending: Family | Admitting: Family

## 2017-09-22 VITALS — BP 119/65 | HR 87 | Resp 18 | Ht 68.0 in | Wt 206.0 lb

## 2017-09-22 DIAGNOSIS — G4733 Obstructive sleep apnea (adult) (pediatric): Secondary | ICD-10-CM | POA: Diagnosis not present

## 2017-09-22 DIAGNOSIS — J449 Chronic obstructive pulmonary disease, unspecified: Secondary | ICD-10-CM | POA: Diagnosis not present

## 2017-09-22 DIAGNOSIS — Z87891 Personal history of nicotine dependence: Secondary | ICD-10-CM | POA: Diagnosis not present

## 2017-09-22 DIAGNOSIS — Z7982 Long term (current) use of aspirin: Secondary | ICD-10-CM | POA: Diagnosis not present

## 2017-09-22 DIAGNOSIS — Z7984 Long term (current) use of oral hypoglycemic drugs: Secondary | ICD-10-CM | POA: Insufficient documentation

## 2017-09-22 DIAGNOSIS — E119 Type 2 diabetes mellitus without complications: Secondary | ICD-10-CM | POA: Diagnosis not present

## 2017-09-22 DIAGNOSIS — Z79899 Other long term (current) drug therapy: Secondary | ICD-10-CM | POA: Diagnosis not present

## 2017-09-22 DIAGNOSIS — I272 Pulmonary hypertension, unspecified: Secondary | ICD-10-CM | POA: Diagnosis not present

## 2017-09-22 DIAGNOSIS — R34 Anuria and oliguria: Secondary | ICD-10-CM

## 2017-09-22 DIAGNOSIS — I110 Hypertensive heart disease with heart failure: Secondary | ICD-10-CM | POA: Diagnosis not present

## 2017-09-22 DIAGNOSIS — I10 Essential (primary) hypertension: Secondary | ICD-10-CM

## 2017-09-22 DIAGNOSIS — E785 Hyperlipidemia, unspecified: Secondary | ICD-10-CM | POA: Insufficient documentation

## 2017-09-22 DIAGNOSIS — I5032 Chronic diastolic (congestive) heart failure: Secondary | ICD-10-CM

## 2017-09-22 DIAGNOSIS — I509 Heart failure, unspecified: Secondary | ICD-10-CM | POA: Diagnosis present

## 2017-09-22 DIAGNOSIS — Z8719 Personal history of other diseases of the digestive system: Secondary | ICD-10-CM | POA: Diagnosis not present

## 2017-09-22 LAB — BASIC METABOLIC PANEL
Anion gap: 12 (ref 5–15)
BUN: 9 mg/dL (ref 6–20)
CALCIUM: 9.4 mg/dL (ref 8.9–10.3)
CO2: 29 mmol/L (ref 22–32)
CREATININE: 0.87 mg/dL (ref 0.44–1.00)
Chloride: 97 mmol/L — ABNORMAL LOW (ref 101–111)
GFR calc Af Amer: >60 mL/min (ref >60)
GLUCOSE: 120 mg/dL — AB (ref 65–99)
Potassium: 3.9 mmol/L (ref 3.5–5.1)
Sodium: 138 mmol/L (ref 135–145)

## 2017-09-22 LAB — URINALYSIS, ROUTINE W REFLEX MICROSCOPIC
BACTERIA UA: NONE SEEN
Bilirubin Urine: NEGATIVE
Glucose, UA: NEGATIVE mg/dL
Hgb urine dipstick: NEGATIVE
Ketones, ur: NEGATIVE mg/dL
Nitrite: NEGATIVE
PROTEIN: NEGATIVE mg/dL
SPECIFIC GRAVITY, URINE: 1.017 (ref 1.005–1.030)
pH: 5 (ref 5.0–8.0)

## 2017-09-22 NOTE — Patient Instructions (Signed)
Continue weighing daily and call for an overnight weight gain of > 2 pounds or a weekly weight gain of >5 pounds. 

## 2017-09-22 NOTE — Telephone Encounter (Signed)
Spoke with patient and rescheduled lexiscan for Monday 09/26/17 at 08:00AM with [facility] 07[facility]. She verbalized understanding of all instructions with no further questions at this time.

## 2017-09-26 ENCOUNTER — Ambulatory Visit: Payer: Medicaid Other | Attending: Nurse Practitioner

## 2017-10-04 ENCOUNTER — Ambulatory Visit: Payer: Medicaid Other | Admitting: Pain Medicine

## 2017-10-06 ENCOUNTER — Ambulatory Visit: Payer: Medicaid Other | Attending: Pain Medicine | Admitting: Pain Medicine

## 2017-10-06 ENCOUNTER — Encounter: Payer: Self-pay | Admitting: Pain Medicine

## 2017-10-06 ENCOUNTER — Other Ambulatory Visit: Payer: Self-pay

## 2017-10-06 VITALS — BP 111/54 | HR 78 | Temp 98.0°F | Resp 16 | Ht 68.0 in | Wt 200.0 lb

## 2017-10-06 DIAGNOSIS — M25562 Pain in left knee: Secondary | ICD-10-CM | POA: Diagnosis present

## 2017-10-06 DIAGNOSIS — G8929 Other chronic pain: Secondary | ICD-10-CM | POA: Diagnosis not present

## 2017-10-06 DIAGNOSIS — M47816 Spondylosis without myelopathy or radiculopathy, lumbar region: Secondary | ICD-10-CM

## 2017-10-06 DIAGNOSIS — M170 Bilateral primary osteoarthritis of knee: Secondary | ICD-10-CM | POA: Diagnosis not present

## 2017-10-06 DIAGNOSIS — M25561 Pain in right knee: Secondary | ICD-10-CM | POA: Diagnosis present

## 2017-10-06 DIAGNOSIS — M47817 Spondylosis without myelopathy or radiculopathy, lumbosacral region: Secondary | ICD-10-CM | POA: Insufficient documentation

## 2017-10-06 MED ORDER — ROPIVACAINE HCL 2 MG/ML IJ SOLN
INTRAMUSCULAR | Status: AC
  Filled 2017-10-06: qty 10

## 2017-10-06 MED ORDER — LIDOCAINE HCL (PF) 1 % IJ SOLN
4.0000 mL | Freq: Once | INTRAMUSCULAR | Status: AC
  Administered 2017-10-06: 5 mL

## 2017-10-06 MED ORDER — LIDOCAINE HCL (PF) 1 % IJ SOLN
INTRAMUSCULAR | Status: AC
  Filled 2017-10-06: qty 5

## 2017-10-06 MED ORDER — SODIUM HYALURONATE (VISCOSUP) 20 MG/2ML IX SOSY
2.0000 mL | PREFILLED_SYRINGE | Freq: Once | INTRA_ARTICULAR | Status: AC
  Administered 2017-10-06: 2 mL via INTRA_ARTICULAR

## 2017-10-06 MED ORDER — ROPIVACAINE HCL 2 MG/ML IJ SOLN
4.0000 mL | Freq: Once | INTRAMUSCULAR | Status: AC
  Administered 2017-10-06: 4 mL via INTRA_ARTICULAR

## 2017-10-06 NOTE — Progress Notes (Signed)
Patient's Name: Erica [name]  MRN: [id number]  Referring Provider: Hillery Aldo, MD  DOB: February 18, 1966  PCP: Hillery Aldo, MD  DOS: 10/06/2017  Note by: Oswaldo Done, MD  Service setting: Ambulatory outpatient  Specialty: Interventional Pain Management  Patient type: Established  Location: [facility] (AMB) Pain Management Facility  Visit type: Interventional Procedure   Primary Reason for Visit: Interventional Pain Management Treatment. CC: Back Pain; Hip Pain (left); and Knee Pain (bilateral)  Procedure:  Anesthesia, Analgesia, Anxiolysis:  Type: Therapeutic Intra-Articular Hyalgan Knee Injection #2  Region: Lateral infrapatellar Knee Region Level: Knee Joint Laterality: Bilateral  Type: Local Anesthesia Indication(s): Analgesia         Local Anesthetic: Lidocaine 1-2% Route: Infiltration ([place]/IM) IV Access: Declined Sedation: Declined    Indications: 1. Osteoarthritis of knee (Bilateral) (L>R)   2. Chronic knee pain [facility] source of pain) (Bilateral) (L>R)    Pain Score: Pre-procedure: 4 /10 Post-procedure: 0-No pain/10  Pre-op Assessment:  Erica [name] is a 52 y.o. (year old), female patient, seen today for interventional treatment. She  has a past surgical history that includes Knee surgery (Left) and Flexible bronchoscopy (N/A, 06/20/2015). Erica [name] has a current medication list which includes the following prescription(s): albuterol, aspirin, atorvastatin, vitamin d3, diltiazem, diltiazem, duloxetine, duloxetine, fluticasone-salmeterol, folic acid, furosemide, losartan, magnesium oxide (antacid), metformin, oxycodone, oxygen-helium, pantoprazole, potassium chloride sa, pregabalin, sucralfate, tiotropium, tizanidine, oxycodone, and oxycodone. Her primarily concern today is the Back Pain; Hip Pain (left); and Knee Pain (bilateral)  Initial Vital Signs:  Pulse Rate: 85 Temp: 98 F (36.7 C) Resp: 16 BP: 123/68 SpO2: 99 %  BMI: Estimated body mass index is 30.41 kg/m as  calculated from the following:   Height as of this encounter: 5\' 8"  (1.727 m).   Weight as of this encounter: 200 lb (90.7 kg).  Risk Assessment: Allergies: Reviewed. She is allergic to tramadol and penicillins.  Allergy Precautions: None required Coagulopathies: Reviewed. None identified.  Blood-thinner therapy: None at this time Active Infection(s): Reviewed. None identified. Erica [name] is afebrile  Site Confirmation: Erica [name] was asked to confirm the procedure and laterality before marking the site Procedure checklist: Completed Consent: Before the procedure and under the influence of no sedative(s), amnesic(s), or anxiolytics, the patient was informed of the treatment options, risks and possible complications. To fulfill our ethical and legal obligations, as recommended by the American Medical Association's Code of Ethics, I have informed the patient of my clinical impression; the nature and purpose of the treatment or procedure; the risks, benefits, and possible complications of the intervention; the alternatives, including doing nothing; the risk(s) and benefit(s) of the alternative treatment(s) or procedure(s); and the risk(s) and benefit(s) of doing nothing. The patient was provided information about the general risks and possible complications associated with the procedure. These may include, but are not limited to: failure to achieve desired goals, infection, bleeding, organ or nerve damage, allergic reactions, paralysis, and death. In addition, the patient was informed of those risks and complications associated to the procedure, such as failure to decrease pain; infection; bleeding; organ or nerve damage with subsequent damage to sensory, motor, and/or autonomic systems, resulting in permanent pain, numbness, and/or weakness of one or several areas of the body; allergic reactions; (i.e.: anaphylactic reaction); and/or death. Furthermore, the patient was informed of those risks and  complications associated with the medications. These include, but are not limited to: allergic reactions (i.e.: anaphylactic or anaphylactoid reaction(s)); adrenal axis suppression; blood sugar elevation that in diabetics may  result in ketoacidosis or comma; water retention that in patients with history of congestive heart failure may result in shortness of breath, pulmonary edema, and decompensation with resultant heart failure; weight gain; swelling or edema; medication-induced neural toxicity; particulate matter embolism and blood vessel occlusion with resultant organ, and/or nervous system infarction; and/or aseptic necrosis of one or more joints. Finally, the patient was informed that Medicine is not an exact science; therefore, there is also the possibility of unforeseen or unpredictable risks and/or possible complications that may result in a catastrophic outcome. The patient indicated having understood very clearly. We have given the patient no guarantees and we have made no promises. Enough time was given to the patient to ask questions, all of which were answered to the patient's satisfaction. Erica [name] has indicated that she wanted to continue with the procedure. Attestation: I, the ordering provider, attest that I have discussed with the patient the benefits, risks, side-effects, alternatives, likelihood of achieving goals, and potential problems during recovery for the procedure that I have provided informed consent. Date  Time: 10/06/2017  9:41 AM  Pre-Procedure Preparation:  Monitoring: As per clinic protocol. Respiration, ETCO2, SpO2, BP, heart rate and rhythm monitor placed and checked for adequate function Safety Precautions: Patient was assessed for positional comfort and pressure points before starting the procedure. Time-out: I initiated and conducted the "Time-out" before starting the procedure, as per protocol. The patient was asked to participate by confirming the accuracy of the  "Time Out" information. Verification of the correct person, site, and procedure were performed and confirmed by me, the nursing staff, and the patient. "Time-out" conducted as per Joint Commission's Universal Protocol (UP.01.01.01). Time: 1004  Description of Procedure:       Position: Sitting Target Area: Knee Joint Approach: Just above the Lateral tibial plateau, lateral to the infrapatellar tendon. Area Prepped: Entire knee area, from the mid-thigh to the mid-shin. Prepping solution: ChloraPrep (2% chlorhexidine gluconate and 70% isopropyl alcohol) Safety Precautions: Aspiration looking for blood return was conducted prior to all injections. At no point did we inject any substances, as a needle was being advanced. No attempts were made at seeking any paresthesias. Safe injection practices and needle disposal techniques used. Medications properly checked for expiration dates. SDV (single dose vial) medications used. Description of the Procedure: Protocol guidelines were followed. The patient was placed in position over the fluoroscopy table. The target area was identified and the area prepped in the usual manner. Skin desensitized using vapocoolant spray. Skin & deeper tissues infiltrated with local anesthetic. Appropriate amount of time allowed to pass for local anesthetics to take effect. The procedure needles were then advanced to the target area. Proper needle placement secured. Negative aspiration confirmed. Solution injected in intermittent fashion, asking for systemic symptoms every 0.5cc of injectate. The needles were then removed and the area cleansed, making sure to leave some of the prepping solution back to take advantage of its long term bactericidal properties. Vitals:   10/06/17 0939 10/06/17 1005 10/06/17 1012  BP: 123/68 (!) 112/56 (!) 111/54  Pulse: 85 82 78  Resp: 16 16 16   Temp: 98 F (36.7 C)    SpO2: 99% 98% 99%  Weight: 200 lb (90.7 kg)    Height: 5\' 8"  (1.727 m)       Start Time: 1004 hrs. End Time: 1008 hrs. Materials:  Needle(s) Type: Regular needle Gauge: 22G Length: 3.5-in Medication(s): Please see orders for medications and dosing details.  Imaging Guidance:  Type of Imaging Technique:  None used Indication(s): N/A Exposure Time: No patient exposure Contrast: None used. Fluoroscopic Guidance: N/A Ultrasound Guidance: N/A Interpretation: N/A  Antibiotic Prophylaxis:   Anti-infectives (From admission, onward)   None     Indication(s): None identified  Post-operative Assessment:  Post-procedure Vital Signs:  Pulse Rate: 78 Temp: 98 F (36.7 C) Resp: 16 BP: (!) 111/54 SpO2: 99 %  EBL: None  Complications: No immediate post-treatment complications observed by team, or reported by patient.  Note: The patient tolerated the entire procedure well. A repeat set of vitals were taken after the procedure and the patient was kept under observation following institutional policy, for this type of procedure. Post-procedural neurological assessment was performed, showing return to baseline, prior to discharge. The patient was provided with post-procedure discharge instructions, including a section on how to identify potential problems. Should any problems arise concerning this procedure, the patient was given instructions to immediately contact us, at any time, without hesitation. In any case, we plan to contact the patient by telephone for a follow-up status report regarding this interventional procedure.  Comments:  No additional relevant information.  Plan of Care   Imaging Orders  No imaging studies ordered today    Procedure Orders     KNEE INJECTION     KNEE INJECTION     LUMBAR FACET(MEDIAL BRANCH NERVE BLOCK) MBNB  Medications ordered for procedure: Meds ordered this encounter  Medications  . lidocaine (PF) (XYLOCAINE) 1 % injection 4 mL  . ropivacaine (PF) 2 mg/mL (0.2%) (NAROPIN) injection 4 mL  . Sodium Hyaluronate SOSY 2  mL  . Sodium Hyaluronate SOSY 2 mL   Medications administered: We administered lidocaine (PF), ropivacaine (PF) 2 mg/mL (0.2%), Sodium Hyaluronate, and Sodium Hyaluronate.  See the medical record for exact dosing, route, and time of administration.  New Prescriptions   No medications on file   Disposition: Discharge home  Discharge Date & Time: 10/06/2017; 1017 hrs.   Physician-requested Follow-up: Return in about 2 weeks (around 10/20/2017) for Procedure (w/ sedation): (B) L-FCT Blk + (B) Hyalgan #3.  Future Appointments  Date Time Provider Department Center  10/12/2017  9:30 AM Barbette MerinoKing, Crystal M, NP [facility] None  10/25/2017  1:00 PM Delano MetzNaveira, Doug Bucklin, MD [facility] None  12/26/2017  1:40 PM Delma FreezeHackney, Tina A, FNP [facility] None   Primary Care Physician: Hillery AldoPatel, Sarah, MD [facility] Note by: Oswaldo DoneFrancisco A Yvonnie Schinke, MD Date: 10/06/2017; Time: 11:28 AM  Disclaimer:  Medicine is not an Visual merchandiserexact science. The only guarantee in medicine is that nothing is guaranteed. It is important to note that the decision to proceed with this intervention was based on the information collected from the patient. The Data and conclusions were drawn from the patient's questionnaire, the interview, and the physical examination. Because the information was provided in large part by the patient, it cannot be guaranteed that it has not been purposely or unconsciously manipulated. Every effort has been made to obtain as much relevant data as possible for this evaluation. It is important to note that the conclusions that lead to this procedure are derived in large part from the available data. Always take into account that the treatment will also be dependent on availability of resources and existing treatment guidelines, considered by other Pain Management Practitioners as being common knowledge and practice, at the time of the intervention. For Medico-Legal purposes, it is also important to  point out that variation in procedural techniques and pharmacological choices are the acceptable norm. The indications,  contraindications, technique, and results of the above procedure should only be interpreted and judged by a Board-Certified Interventional Pain Specialist with extensive familiarity and expertise in the same exact procedure and technique.

## 2017-10-06 NOTE — Progress Notes (Signed)
Safety precautions to be maintained throughout the outpatient stay will include: orient to surroundings, keep bed in low position, maintain call bell within reach at all times, provide assistance with transfer out of bed and ambulation.  

## 2017-10-06 NOTE — Patient Instructions (Addendum)
____________________________________________________________________________________________  Post-Procedure Discharge Instructions  Instructions:  Apply ice: Fill a plastic sandwich bag with crushed ice. Cover it with a small towel and apply to injection site. Apply for 15 minutes then remove x 15 minutes. Repeat sequence on day of procedure, until you go to bed. The purpose is to minimize swelling and discomfort after procedure.  Apply heat: Apply heat to procedure site starting the day following the procedure. The purpose is to treat any soreness and discomfort from the procedure.  Food intake: Start with clear liquids (like water) and advance to regular food, as tolerated.   Physical activities: Keep activities to a minimum for the first 8 hours after the procedure.   Driving: If you have received any sedation, you are not allowed to drive for 24 hours after your procedure.  Blood thinner: Restart your blood thinner 6 hours after your procedure. (Only for those taking blood thinners)  Insulin: As soon as you can eat, you may resume your normal dosing schedule. (Only for those taking insulin)  Infection prevention: Keep procedure site clean and dry.  Post-procedure Pain Diary: Extremely important that this be done correctly and accurately. Recorded information will be used to determine the next step in treatment.  Pain evaluated is that of treated area only. Do not include pain from an untreated area.  Complete every hour, on the hour, for the initial 8 hours. Set an alarm to help you do this part accurately.  Do not go to sleep and have it completed later. It will not be accurate.  Follow-up appointment: Keep your follow-up appointment after the procedure. Usually 2 weeks for most procedures. (6 weeks in the case of radiofrequency.) Bring you pain diary.   Expect:  From numbing medicine (AKA: Local Anesthetics): Numbness or decrease in pain.  Onset: Full effect within 15  minutes of injected.  Duration: It will depend on the type of local anesthetic used. On the average, 1 to 8 hours.   From steroids: Decrease in swelling or inflammation. Once inflammation is improved, relief of the pain will follow.  Onset of benefits: Depends on the amount of swelling present. The more swelling, the longer it will take for the benefits to be seen. In some cases, up to 10 days.  Duration: Steroids will stay in the system x 2 weeks. Duration of benefits will depend on multiple posibilities including persistent irritating factors.  From procedure: Some discomfort is to be expected once the numbing medicine wears off. This should be minimal if ice and heat are applied as instructed.  Call if:  You experience numbness and weakness that gets worse with time, as opposed to wearing off.  New onset bowel or bladder incontinence. (This applies to Spinal procedures only)  Emergency Numbers:  Durning business hours (Monday - Thursday, 8:00 AM - 4:00 PM) (Friday, 9:00 AM - 12:00 Noon): [phone]  After hours: [phone] ____________________________________________________________________________________________   ____________________________________________________________________________________________  Preparing for Procedure with Sedation  Instructions: . Oral Intake: Do not eat or drink anything for at least 8 hours prior to your procedure. . Transportation: Public transportation is not allowed. Bring an adult driver. The driver must be physically present in our waiting room before any procedure can be started. Marland Kitchen Physical Assistance: Bring an adult physically capable of assisting you, in the event you need help. This adult should keep you company at home for at least 6 hours after the procedure. . Blood Pressure Medicine: Take your blood pressure medicine with a sip  of water the morning of the procedure. . Blood thinners:  . Diabetics on insulin: Notify the  staff so that you can be scheduled 1st case in the morning. If your diabetes requires high dose insulin, take only  of your normal insulin dose the morning of the procedure and notify the staff that you have done so. . Preventing infections: Shower with an antibacterial soap the morning of your procedure. . Build-up your immune system: Take 1000 mg of Vitamin C with every meal (3 times a day) the day prior to your procedure. Marland Kitchen Antibiotics: Inform the staff if you have a condition or reason that requires you to take antibiotics before dental procedures. . Pregnancy: If you are pregnant, call and cancel the procedure. . Sickness: If you have a cold, fever, or any active infections, call and cancel the procedure. . Arrival: You must be in the facility at least 30 minutes prior to your scheduled procedure. . Children: Do not bring children with you. . Dress appropriately: Bring dark clothing that you would not mind if they get stained. . Valuables: Do not bring any jewelry or valuables.  Procedure appointments are reserved for interventional treatments only. Marland Kitchen No Prescription Refills. . No medication changes will be discussed during procedure appointments. . No disability issues will be discussed.  Remember:  Regular Business hours are:  Monday to Thursday 8:00 AM to 4:00 PM  Provider's Schedule: Delano Metz, MD:  Procedure days: Tuesday and Thursday 7:30 AM to 4:00 PM  Edward Jolly, MD:  Procedure days: Monday and Wednesday 7:30 AM to 4:00 PM ____________________________________________________________________________________________   Preparing for Procedure with Sedation Instructions: . Oral Intake: Do not eat or drink anything for at least 8 hours prior to your procedure. . Transportation: Public transportation is not allowed. Bring an adult driver. The driver must be physically present in our waiting room before any procedure can be started. Marland Kitchen Physical Assistance: Bring an  adult capable of physically assisting you, in the event you need help. . Blood Pressure Medicine: Take your blood pressure medicine with a sip of water the morning of the procedure. . Insulin: Take only  of your normal insulin dose. . Preventing infections: Shower with an antibacterial soap the morning of your procedure. . Build-up your immune system: Take 1000 mg of Vitamin C with every meal (3 times a day) the day prior to your procedure. . Pregnancy: If you are pregnant, call and cancel the procedure. . Sickness: If you have a cold, fever, or any active infections, call and cancel the procedure. . Arrival: You must be in the facility at least 30 minutes prior to your scheduled procedure. . Children: Do not bring children with you. . Dress appropriately: Bring dark clothing that you would not mind if they get stained. . Valuables: Do not bring any jewelry or valuables. Procedure appointments are reserved for interventional treatments only. Marland Kitchen No Prescription Refills. . No medication changes will be discussed during procedure appointments. No disability issues will be discussed.Facet Blocks Patient Information  Description: The facets are joints in the spine between the vertebrae.  Like any joints in the body, facets can become irritated and painful.  Arthritis can also effect the facets.  By injecting steroids and local anesthetic in and around these joints, we can temporarily block the nerve supply to them.  Steroids act directly on irritated nerves and tissues to reduce selling and inflammation which often leads to decreased pain.  Facet blocks may be done anywhere along the  spine from the neck to the low back depending upon the location of your pain.   After numbing the skin with local anesthetic (like Novocaine), a small needle is passed onto the facet joints under x-ray guidance.  You may experience a sensation of pressure while this is being done.  The entire block usually lasts about 15-25  minutes.   Conditions which may be treated by facet blocks:   Low back/buttock pain  Neck/shoulder pain  Certain types of headaches  Preparation for the injection:  1. Do not eat any solid food or dairy products within 8 hours of your appointment. 2. You may drink clear liquid up to 3 hours before appointment.  Clear liquids include water, black coffee, juice or soda.  No milk or cream please. 3. You may take your regular medication, including pain medications, with a sip of water before your appointment.  Diabetics should hold regular insulin (if taken separately) and take 1/2 normal NPH dose the morning of the procedure.  Carry some sugar containing items with you to your appointment. 4. A driver must accompany you and be prepared to drive you home after your procedure. 5. Bring all your current medications with you. 6. An IV may be inserted and sedation may be given at the discretion of the physician. 7. A blood pressure cuff, EKG and other monitors will often be applied during the procedure.  Some patients may need to have extra oxygen administered for a short period. 8. You will be asked to provide medical information, including your allergies and medications, prior to the procedure.  We must know immediately if you are taking blood thinners (like Coumadin/Warfarin) or if you are allergic to IV iodine contrast (dye).  We must know if you could possible be pregnant.  Possible side-effects:   Bleeding from needle site  Infection (rare, may require surgery)  Nerve injury (rare)  Numbness & tingling (temporary)  Difficulty urinating (rare, temporary)  Spinal headache (a headache worse with upright posture)  Light-headedness (temporary)  Pain at injection site (serveral days)  Decreased blood pressure (rare, temporary)  Weakness in arm/leg (temporary)  Pressure sensation in back/neck (temporary)   Call if you experience:   Fever/chills associated with headache or  increased back/neck pain  Headache worsened by an upright position  New onset, weakness or numbness of an extremity below the injection site  Hives or difficulty breathing (go to the emergency room)  Inflammation or drainage at the injection site(s)  Severe back/neck pain greater than usual  New symptoms which are concerning to you  Please note:  Although the local anesthetic injected can often make your back or neck feel good for several hours after the injection, the pain will likely return. It takes 3-7 days for steroids to work.  You may not notice any pain relief for at least one week.  If effective, we will often do a series of 2-3 injections spaced 3-6 weeks apart to maximally decrease your pain.  After the initial series, you may be a candidate for a more permanent nerve block of the facets.  If you have any questions, please call [phone] [facility] Clinic[facility] Information What to expect: Most procedures involve the use of a local anesthetic (numbing medicine), and a steroid (anti-inflammatory medicine).  The local anesthetics may cause temporary numbness and weakness of the legs or arms, depending on the location of the block. This numbness/weakness may last 4-6 hours, depending on the local anesthetic used.  In rare instances, it can last up to 24 hours. While numb, you must be very careful not to injure the extremity.  After any procedure, you could expect the pain to get better within 15-20 minutes. This relief is temporary and may last 4-6 hours. Once the local anesthetics wears off, you could experience discomfort, possibly more than usual, for up to 10 (ten) days. In the case of radiofrequencies, it may last up to 6 weeks. Surgeries may take up to 8 weeks for the healing process. The discomfort is due to the irritation caused by needles going through skin and muscle. To minimize the discomfort, we recommend using ice the first day,  and heat from then on. The ice should be applied for 15 minutes on, and 15 minutes off. Keep repeating this cycle until bedtime. Avoid applying the ice directly to the skin, to prevent frostbite. Heat should be used daily, until the pain improves (4-10 days). Be careful not to burn yourself.  Occasionally you may experience muscle spasms or cramps. These occur as a consequence of the irritation caused by the needle sticks to the muscle and the blood that will inevitably be lost into the surrounding muscle tissue. Blood tends to be very irritating to tissues, which tend to react by going into spasm. These spasms may start the same day of your procedure, but they may also take days to develop. This late onset type of spasm or cramp is usually caused by electrolyte imbalances triggered by the steroids, at the level of the kidney. Cramps and spasms tend to respond well to muscle relaxants, multivitamins (some are triggered by the procedure, but may have their origins in vitamin deficiencies), and "Gatorade", or any sports drinks that can replenish any electrolyte imbalances. (If you are a diabetic, ask your pharmacist to get you a sugar-free brand.) Warm showers or baths may also be helpful. Stretching exercises are highly recommended. General Instructions:  Be alert for signs of possible infection: redness, swelling, heat, red streaks, elevated temperature, and/or fever. These typically appear 4 to 6 days after the procedure. Immediately notify your doctor if you experience unusual bleeding, difficulty breathing, or loss of bowel or bladder control. If you experience increased pain, do not increase your pain medicine intake, unless instructed by your pain physician. Post-Procedure Care:  Be careful in moving about. Muscle spasms in the area of the injection may occur. Applying ice or heat to the area is often helpful. The incidence of spinal headaches after epidural injections ranges between 1.4% and 6%. If you  develop a headache that does not seem to respond to conservative therapy, please let your physician know. This can be treated with an epidural blood patch.   Post-procedure numbness or redness is to be expected, however it should average 4 to 6 hours. If numbness and weakness of your extremities begins to develop 4 to 6 hours after your procedure, and is felt to be progressing and worsening, immediately contact your physician.   Diet:  If you experience nausea, do not eat until this sensation goes away. If you had a "Stellate Ganglion Block" for upper extremity "Reflex Sympathetic Dystrophy", do not eat or drink until your hoarseness goes away. In any case, always start with liquids first and if you tolerate them well, then slowly progress to more solid foods. Activity:  For the first 4 to 6 hours after the procedure, use caution in moving about as you may experience numbness and/or weakness. Use caution in cooking, using household  electrical appliances, and climbing steps. If you need to reach your Doctor call our office: [phone] Monday-Thursday 8:00 am - 4:00 PM    Fridays: Closed     In case of an emergency: In case of emergency, call 911 or go to the nearest emergency room and have the physician there call us.  Interpretation of Procedure Every nerve block has two components: a diagnostic component, and a treatment component. Unrealistic expectations are the most common causes of "perceived failure".  In a perfect world, a single nerve block should be able to completely and permanently eliminate the pain. Sadly, the world is not perfect.  Most pain management nerve blocks are performed using local anesthetics and steroids. Steroids are responsible for any long-term benefit that you may experience. Their purpose is to decrease any chronic swelling that may exist in the area. Steroids begin to work immediately after being injected. However, most patients will not experience any benefits  until 5 to 10 days after the injection, when the swelling has come down to the point where they can tell a difference. Steroids will only help if there is swelling to be treated. As such, they can assist with the diagnosis. If effective, they suggest an inflammatory component to the pain, and if ineffective, they rule out inflammation as the main cause or component of the problem. If the problem is one of mechanical compression, you will get no benefit from those steroids.   In the case of local anesthetics, they have a crucial role in the diagnosis of your condition. Most will begin to work within15 to 20 minutes after injection. The duration will depend on the type used (short- vs. Long-acting). It is of outmost importance that patients keep tract of their pain, after the procedure. To assist with this matter, a "Post-procedure Pain Diary" is provided. Make sure to complete it and to bring it back to your follow-up appointment.  As long as the patient keeps accurate, detailed records of their symptoms after every procedure, and returns to have those interpreted, every procedure will provide [place] with invaluable information. Even a block that does not provide the patient with any relief, will always provide [place] with information about the mechanism and the origin of the pain. The only time a nerve block can be considered a waste of time is when patients do not keep track of the results, or do not keep their post-procedure appointment.  Reporting the results back to your physician The Pain Score  Pain is a subjective complaint. It cannot be seen, touched, or measured. We depend entirely on the patient's report of the pain in order to assess your condition and treatment. To evaluate the pain, we use a pain scale, where "0" means "No Pain", and a "10" is "the worst possible pain that you can even imagine" (i.e. something like been eaten alive by a shark or being torn apart by a lion).   You will frequently be  asked to rate your pain. Please be as accurate, remember that medical decisions will be based on your responses. Please do not rate your pain above a 10. Doing so is actually interpreted as "symptom magnification" (exaggeration), as well as lack of understanding with regards to the scale. To put this into perspective, when you tell us that your pain is at a 10 (ten), what you are saying is that there is nothing we can do to make this pain any worse. (Carefully think about that.)

## 2017-10-07 ENCOUNTER — Telehealth: Payer: Self-pay | Admitting: *Deleted

## 2017-10-07 NOTE — Telephone Encounter (Signed)
Voicemail left with patient to call our office if there are questions or concerns re; procedure on yesterday.  

## 2017-10-12 ENCOUNTER — Encounter: Payer: Medicaid Other | Admitting: Nurse Practitioner

## 2017-10-17 ENCOUNTER — Telehealth: Payer: Self-pay | Admitting: Cardiovascular Disease

## 2017-10-17 DIAGNOSIS — F17200 Nicotine dependence, unspecified, uncomplicated: Secondary | ICD-10-CM | POA: Insufficient documentation

## 2017-10-17 DIAGNOSIS — K625 Hemorrhage of anus and rectum: Secondary | ICD-10-CM | POA: Insufficient documentation

## 2017-10-17 DIAGNOSIS — R339 Retention of urine, unspecified: Secondary | ICD-10-CM | POA: Insufficient documentation

## 2017-10-17 NOTE — Telephone Encounter (Signed)
Pt called to schedule Myoview  She is scheduled for 10/21/17   She needs a call back with instructions for exam   Please call back

## 2017-10-20 ENCOUNTER — Encounter: Payer: Self-pay | Admitting: Nurse Practitioner

## 2017-10-20 ENCOUNTER — Ambulatory Visit: Payer: Medicaid Other | Attending: Nurse Practitioner | Admitting: Nurse Practitioner

## 2017-10-20 ENCOUNTER — Other Ambulatory Visit: Payer: Self-pay

## 2017-10-20 VITALS — BP 130/81 | HR 83 | Temp 98.4°F | Resp 18 | Ht 68.0 in | Wt 200.0 lb

## 2017-10-20 DIAGNOSIS — E1142 Type 2 diabetes mellitus with diabetic polyneuropathy: Secondary | ICD-10-CM | POA: Diagnosis not present

## 2017-10-20 DIAGNOSIS — M542 Cervicalgia: Secondary | ICD-10-CM | POA: Insufficient documentation

## 2017-10-20 DIAGNOSIS — I5032 Chronic diastolic (congestive) heart failure: Secondary | ICD-10-CM | POA: Insufficient documentation

## 2017-10-20 DIAGNOSIS — M545 Low back pain: Secondary | ICD-10-CM | POA: Insufficient documentation

## 2017-10-20 DIAGNOSIS — Z801 Family history of malignant neoplasm of trachea, bronchus and lung: Secondary | ICD-10-CM | POA: Diagnosis not present

## 2017-10-20 DIAGNOSIS — Z853 Personal history of malignant neoplasm of breast: Secondary | ICD-10-CM | POA: Insufficient documentation

## 2017-10-20 DIAGNOSIS — M25562 Pain in left knee: Secondary | ICD-10-CM | POA: Insufficient documentation

## 2017-10-20 DIAGNOSIS — M1732 Unilateral post-traumatic osteoarthritis, left knee: Secondary | ICD-10-CM | POA: Insufficient documentation

## 2017-10-20 DIAGNOSIS — Z79899 Other long term (current) drug therapy: Secondary | ICD-10-CM | POA: Insufficient documentation

## 2017-10-20 DIAGNOSIS — M533 Sacrococcygeal disorders, not elsewhere classified: Secondary | ICD-10-CM | POA: Diagnosis not present

## 2017-10-20 DIAGNOSIS — M47817 Spondylosis without myelopathy or radiculopathy, lumbosacral region: Secondary | ICD-10-CM | POA: Insufficient documentation

## 2017-10-20 DIAGNOSIS — M62830 Muscle spasm of back: Secondary | ICD-10-CM | POA: Insufficient documentation

## 2017-10-20 DIAGNOSIS — M25561 Pain in right knee: Secondary | ICD-10-CM | POA: Insufficient documentation

## 2017-10-20 DIAGNOSIS — I110 Hypertensive heart disease with heart failure: Secondary | ICD-10-CM | POA: Insufficient documentation

## 2017-10-20 DIAGNOSIS — G8929 Other chronic pain: Secondary | ICD-10-CM | POA: Diagnosis not present

## 2017-10-20 DIAGNOSIS — E785 Hyperlipidemia, unspecified: Secondary | ICD-10-CM | POA: Diagnosis not present

## 2017-10-20 DIAGNOSIS — Z79891 Long term (current) use of opiate analgesic: Secondary | ICD-10-CM | POA: Diagnosis not present

## 2017-10-20 DIAGNOSIS — G609 Hereditary and idiopathic neuropathy, unspecified: Secondary | ICD-10-CM | POA: Diagnosis not present

## 2017-10-20 DIAGNOSIS — Z7982 Long term (current) use of aspirin: Secondary | ICD-10-CM | POA: Diagnosis not present

## 2017-10-20 DIAGNOSIS — J449 Chronic obstructive pulmonary disease, unspecified: Secondary | ICD-10-CM | POA: Diagnosis not present

## 2017-10-20 DIAGNOSIS — M47816 Spondylosis without myelopathy or radiculopathy, lumbar region: Secondary | ICD-10-CM

## 2017-10-20 DIAGNOSIS — G894 Chronic pain syndrome: Secondary | ICD-10-CM | POA: Diagnosis not present

## 2017-10-20 DIAGNOSIS — G4733 Obstructive sleep apnea (adult) (pediatric): Secondary | ICD-10-CM | POA: Diagnosis not present

## 2017-10-20 DIAGNOSIS — M792 Neuralgia and neuritis, unspecified: Secondary | ICD-10-CM

## 2017-10-20 DIAGNOSIS — I272 Pulmonary hypertension, unspecified: Secondary | ICD-10-CM | POA: Insufficient documentation

## 2017-10-20 DIAGNOSIS — Z87891 Personal history of nicotine dependence: Secondary | ICD-10-CM | POA: Diagnosis not present

## 2017-10-20 DIAGNOSIS — Z8711 Personal history of peptic ulcer disease: Secondary | ICD-10-CM | POA: Diagnosis not present

## 2017-10-20 DIAGNOSIS — M25552 Pain in left hip: Secondary | ICD-10-CM | POA: Insufficient documentation

## 2017-10-20 DIAGNOSIS — F329 Major depressive disorder, single episode, unspecified: Secondary | ICD-10-CM | POA: Insufficient documentation

## 2017-10-20 DIAGNOSIS — R700 Elevated erythrocyte sedimentation rate: Secondary | ICD-10-CM | POA: Insufficient documentation

## 2017-10-20 DIAGNOSIS — Z7984 Long term (current) use of oral hypoglycemic drugs: Secondary | ICD-10-CM | POA: Diagnosis not present

## 2017-10-20 DIAGNOSIS — Z888 Allergy status to other drugs, medicaments and biological substances status: Secondary | ICD-10-CM | POA: Insufficient documentation

## 2017-10-20 DIAGNOSIS — Z7951 Long term (current) use of inhaled steroids: Secondary | ICD-10-CM | POA: Insufficient documentation

## 2017-10-20 DIAGNOSIS — M25551 Pain in right hip: Secondary | ICD-10-CM | POA: Insufficient documentation

## 2017-10-20 DIAGNOSIS — E559 Vitamin D deficiency, unspecified: Secondary | ICD-10-CM | POA: Insufficient documentation

## 2017-10-20 DIAGNOSIS — Z880 Allergy status to penicillin: Secondary | ICD-10-CM | POA: Diagnosis not present

## 2017-10-20 DIAGNOSIS — M79641 Pain in right hand: Secondary | ICD-10-CM | POA: Insufficient documentation

## 2017-10-20 DIAGNOSIS — M79642 Pain in left hand: Secondary | ICD-10-CM | POA: Insufficient documentation

## 2017-10-20 DIAGNOSIS — M160 Bilateral primary osteoarthritis of hip: Secondary | ICD-10-CM | POA: Insufficient documentation

## 2017-10-20 MED ORDER — OXYCODONE HCL 5 MG PO TABS: 5.0000 mg | ORAL_TABLET | Freq: Three times a day (TID) | ORAL | 0 refills | Status: DC | PRN

## 2017-10-20 MED ORDER — PREGABALIN 75 MG PO CAPS: 75.0000 mg | ORAL_CAPSULE | Freq: Three times a day (TID) | ORAL | 1 refills | Status: DC

## 2017-10-20 NOTE — Progress Notes (Signed)
Nursing Pain Medication Assessment:  Safety precautions to be maintained throughout the outpatient stay will include: orient to surroundings, keep bed in low position, maintain call bell within reach at all times, provide assistance with transfer out of bed and ambulation.  Medication Inspection Compliance: Pill count conducted under aseptic conditions, in front of the patient. Neither the pills nor the bottle was removed from the patient's sight at any time. Once count was completed pills were immediately returned to the patient in their original bottle.  Medication: Oxycodone IR Pill/Patch Count: 0 of 90 pills remain Pill/Patch Appearance: Markings consistent with prescribed medication Bottle Appearance: Standard pharmacy container. Clearly labeled. Filled Date: 02 / 27 / 2019 Last Medication intake:  Ran out of medicine more than 48 hours ago

## 2017-10-20 NOTE — Progress Notes (Signed)
Patient's Name: Erica [name]  MRN: [id number]  Referring Provider: Denton Lank, MD  DOB: 08-16-1965  PCP: Denton Lank, MD  DOS: 10/20/2017  Note by: Vevelyn Francois NP  Service setting: Ambulatory outpatient  Specialty: Interventional Pain Management  Location: [facility] (AMB) Pain Management Facility    Patient type: Established    Primary Reason(s) for Visit: Encounter for prescription drug management. (Level of risk: moderate)  CC: Back Pain (low) and Hip Pain (left)  HPI  Erica [name] is a 52 y.o. year old, female patient, who comes today for a medication management evaluation. She has Chest pain; Hyponatremia; Shortness of breath; Swelling; Cough; COPD (chronic obstructive pulmonary disease) with acute bronchitis ([place]); Renal insufficiency; Pulmonary HTN ([place]); COPD, mild ([place]); Chronic diastolic heart failure ([place]); Smoker; Obstructive sleep apnea; Tachycardia; Depression; Long term current use of opiate analgesic; Long term prescription opiate use; Opiate use; Chronic low back pain (Primary Source of Pain) (Bilateral) (L>R); Chronic knee pain [facility] source of pain) (Bilateral) (L>R); Chronic neck pain (Bilateral) (R>L); Chronic upper back pain (midline); Chronic foot pain (bottom of feet) (Bilateral) (R>L); Chronic hand pain (Bilateral); Peripheral neuropathy, idiopathic (upper and lower extremity); Chronic sacroiliac joint pain (Bilateral) (R>L); Lumbar facet syndrome (Bilateral) (R>L); Chronic pain syndrome; Elevated sedimentation rate; Elevated C-reactive protein (CRP); Neurogenic pain; Vitamin D deficiency; Lumbar spondylosis; Osteoarthritis of knee (Bilateral) (L>R); Intermittent left thoracic Muscle cramps; Spasm of thoracolumbar muscle (Left); Post-traumatic osteoarthritis of knee (Left); Hemarthrosis, left knee; 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); Chronic hip pain (Secondary source of pain) (Bilateral) (L>R); Diabetes ([place]); HTN (hypertension); Vertigo; and Spondylosis without myelopathy  or radiculopathy, lumbosacral region on their problem list. Her primarily concern today is the Back Pain (low) and Hip Pain (left)  Pain Assessment: Location: Lower Back Radiating: left hip Onset: More than a month ago Duration: Chronic pain Quality: Aching, Constant, Throbbing Severity: 4 /10 (self-reported pain score)  Note: Reported level is compatible with observation.                          Timing: Constant Modifying factors: medications, procedures, rest  Erica [name] was last scheduled for an appointment on 09/14/2017 for medication management. During today's appointment we reviewed Erica [name]'s chronic pain status, as well as her outpatient medication regimen.  The patient  reports that she does not use drugs. Her body mass index is 30.41 kg/m.  Further details on both, my assessment(s), as well as the proposed treatment plan, please see below.  Controlled Substance Pharmacotherapy Assessment REMS (Risk Evaluation and Mitigation Strategy)  Analgesic:Oxycodone IR 5 mg 1 tablet by mouth every 8 hours (31m/dayof oxycodone) (22.5MME/day) MME/day:22.510mday.     ShHart [place]RN  10/20/2017  1:33 PM  Sign at close encounter Nursing Pain Medication Assessment:  Safety precautions to be maintained throughout the outpatient stay will include: orient to surroundings, keep bed in low position, maintain call bell within reach at all times, provide assistance with transfer out of bed and ambulation.  Medication Inspection Compliance: Pill count conducted under aseptic conditions, in front of the patient. Neither the pills nor the bottle was removed from the patient's sight at any time. Once count was completed pills were immediately returned to the patient in their original bottle.  Medication: Oxycodone IR Pill/Patch Count: 0 of 90 pills remain Pill/Patch Appearance: Markings consistent with prescribed medication Bottle Appearance: Standard pharmacy container. Clearly  labeled. Filled Date: 02 / 27 / 2019 Last Medication intake:  Ran  out of medicine more than 48 hours ago   Pharmacokinetics: Liberation and absorption (onset of action): WNL Distribution (time to peak effect): WNL Metabolism and excretion (duration of action): WNL         Pharmacodynamics: Desired effects: Analgesia: [name] reports >50% benefit. Functional ability: Patient reports that medication allows her to accomplish basic ADLs Clinically meaningful improvement in function (CMIF): Sustained CMIF goals met Perceived effectiveness: Described as relatively effective, allowing for increase in activities of daily living (ADL) Undesirable effects: Side-effects or Adverse reactions: None reported Monitoring: [place] PMP: Online review of the past 8-monthperiod conducted. Compliant with practice rules and regulations Last UDS on record: Summary  Date Value Ref Range Status  09/14/2017 FINAL  Final    Comment:    ==================================================================== TOXASSURE SELECT 13 (MW) ==================================================================== Test                             Result       Flag       Units Drug Absent but Declared for Prescription Verification   Oxycodone                      Not Detected UNEXPECTED ng/mg creat ==================================================================== Test                      Result    Flag   Units      Ref Range   Creatinine              35               mg/dL      >=20 ==================================================================== Declared Medications:  The flagging and interpretation on this report are based on the  following declared medications.  Unexpected results may arise from  inaccuracies in the declared medications.  **Note: The testing scope of this panel includes these medications:  Oxycodone  **Note: The testing scope of this panel does not include following  reported medications:   Albuterol  Aspirin (Aspirin 81)  Atorvastatin  Diltiazem  Duloxetine  Fluticasone  Folic acid  Furosemide  Losartan (Losartan Potassium)  Magnesium Oxide  Metformin  Oxygen  Pantoprazole  Potassium  Pregabalin  Salmeterol  Sucralfate  Tiotropium  Tizanidine  Vitamin D3 ==================================================================== For clinical consultation, please call [phone] ====================================================================    UDS interpretation: Compliant          Medication Assessment Form: Reviewed. Patient indicates being compliant with therapy Treatment compliance: Compliant Risk Assessment Profile: Aberrant behavior: See prior evaluations. None observed or detected today Comorbid factors increasing risk of overdose: See prior notes. No additional risks detected today Risk of substance use disorder (SUD): Low Opioid Risk Tool - 09/14/17 1310      Family History of Substance Abuse   Alcohol  Negative    Illegal Drugs  Negative    Rx Drugs  Negative      Personal History of Substance Abuse   Alcohol  Negative    Illegal Drugs  Negative    Rx Drugs  Negative      Psychological Disease   Psychological Disease  Positive    ADD  Negative    OCD  Negative    Bipolar  Negative    Schizophrenia  Negative    Depression  Positive patient taking cymbalta for depression and states that it is  working   patient taking cymbalta for depression and  states that it is  working     Total Score   Opioid Risk Tool Scoring  3    Opioid Risk Interpretation  Low Risk      ORT Scoring interpretation table:  Score <3 = Low Risk for SUD  Score between 4-7 = Moderate Risk for SUD  Score >8 = High Risk for Opioid Abuse   Risk Mitigation Strategies:  Patient Counseling: Covered Patient-Prescriber Agreement (PPA): Present and active  Notification to other healthcare providers: Done  Pharmacologic Plan: No change in therapy, at this time.              Laboratory Chemistry  Inflammation Markers (CRP: Acute Phase) (ESR: Chronic Phase) Lab Results  Component Value Date   CRP 1.3 (H) 04/30/2016   ESRSEDRATE 31 (H) 04/30/2016                         Rheumatology Markers Lab Results  Component Value Date   RF <10.0 06/29/2016   ANA Negative 06/29/2016   LABURIC 7.1 (H) 06/19/2015                        Renal Function Markers Lab Results  Component Value Date   BUN 9 09/22/2017   CREATININE 0.87 09/22/2017   GFRAA >60 09/22/2017   GFRNONAA >60 09/22/2017                              Hepatic Function Markers Lab Results  Component Value Date   AST 27 06/14/2016   ALT 26 06/14/2016   ALBUMIN 4.2 06/14/2016   ALKPHOS 83 06/14/2016   LIPASE 90 02/10/2013                        Electrolytes Lab Results  Component Value Date   NA 138 09/22/2017   K 3.9 09/22/2017   CL 97 (L) 09/22/2017   CALCIUM 9.4 09/22/2017   MG 1.9 04/30/2016                        Neuropathy Markers Lab Results  Component Value Date   VITAMINB12 248 04/30/2016   HGBA1C 6.2 (H) 06/14/2015   HIV NON REACTIVE 06/14/2015                        Bone Pathology Markers Lab Results  Component Value Date   25OHVITD1 15 (L) 04/30/2016   25OHVITD2 <1.0 04/30/2016   25OHVITD3 15 04/30/2016                         Coagulation Parameters Lab Results  Component Value Date   PLT 280 04/08/2017                        Cardiovascular Markers Lab Results  Component Value Date   BNP 42.0 06/14/2016   CKTOTAL 56 03/10/2013   CKMB 0.7 03/10/2013   TROPONINI <0.03 06/14/2016   HGB 13.4 04/08/2017   HCT 39.9 04/08/2017                         CA Markers No results found for: CEA, CA125, LABCA2  Note: Lab results reviewed.  Recent Diagnostic Imaging Results  DG Lumbar Spine Complete CLINICAL DATA:  Low back pain after a fall  EXAM: LUMBAR SPINE - COMPLETE 4+ VIEW  COMPARISON:  04/30/2016  FINDINGS: Calcified  pelvic phleboliths. Lumbar alignment is within normal limits. Moderate degenerative changes at L5-S1, diffuse degenerative changes elsewhere in the spine with osteophytes and mild disc space narrowing. Vertebral body heights are maintained. Atherosclerotic vascular calcification.  IMPRESSION: Mild to moderate degenerative changes. No acute osseous abnormality.  Electronically Signed   By: Donavan Foil M.D.   On: 04/08/2017 22:36 DG Chest 2 View CLINICAL DATA:  Dizziness since blood pressure medication change. Also shortness of breath several months.  EXAM: CHEST  2 VIEW  COMPARISON:  06/14/2016  FINDINGS: Lungs are adequately inflated without focal consolidation or effusion. Cardiomediastinal silhouette, bones and soft tissues are within normal.  IMPRESSION: No active cardiopulmonary disease.  Electronically Signed   By: Marin Olp M.D.   On: 04/08/2017 19:56  Complexity Note: Imaging results reviewed. Results shared with Ms. Melina Copa, using [employer].                         Meds   Current Outpatient Medications:  .  albuterol (PROAIR HFA) 108 (90 Base) MCG/ACT inhaler, Inhale into the lungs 4 (four) times daily., Disp: , Rfl:  .  amoxicillin-clavulanate (AUGMENTIN) 875-125 MG tablet, Take 1 tablet by mouth 2 (two) times daily. for 10 days, Disp: , Rfl: 0 .  aspirin EC 81 MG EC tablet, Take 1 tablet (81 mg total) by mouth daily., Disp: 30 tablet, Rfl: 0 .  atorvastatin (LIPITOR) 80 MG tablet, Take 80 mg by mouth daily., Disp: , Rfl:  .  Cholecalciferol (VITAMIN D3) 2000 units capsule, Take 1 capsule (2,000 Units total) by mouth daily., Disp: 30 capsule, Rfl: PRN .  diltiazem (CARDIZEM CD) 120 MG 24 hr capsule, Take 1 capsule (120 mg total) by mouth daily., Disp: 30 capsule, Rfl: 11 .  diltiazem (CARDIZEM) 30 MG tablet, Take 1 tablet (30 mg total) by mouth 3 (three) times daily as needed., Disp: 90 tablet, Rfl: 3 .  DULoxetine (CYMBALTA) 30 MG capsule, Take 30 mg  by mouth., Disp: , Rfl:  .  DULoxetine (CYMBALTA) 60 MG capsule, Take 60 mg by mouth daily., Disp: , Rfl:  .  fluconazole (DIFLUCAN) 150 MG tablet, TAKE 1 TABLET NOW FOR YEAST INFECTION, MAY REPEAT IN 3 DAYS IF SYMPTOMS PERSIST, Disp: , Rfl: 0 .  Fluticasone-Salmeterol (ADVAIR DISKUS IN), Inhale into the lungs 2 (two) times daily., Disp: , Rfl:  .  folic acid (FOLVITE) 1 MG tablet, Take 1 mg by mouth daily., Disp: , Rfl:  .  furosemide (LASIX) 40 MG tablet, Take 40 mg by mouth 2 (two) times daily. Take an additional 40 mg in the am for weight gain as needed, Disp: , Rfl:  .  losartan (COZAAR) 100 MG tablet, Take 1/2 tablet (50 mg) by mouth twice daily, Disp: , Rfl:  .  Magnesium Oxide, Antacid, 500 MG CAPS, once daily., Disp: , Rfl:  .  metFORMIN (GLUCOPHAGE) 500 MG tablet, Take 500 mg by mouth 2 (two) times daily with a meal., Disp: , Rfl:  .  oxyCODONE (OXY IR/ROXICODONE) 5 MG immediate release tablet, Take 1 tablet (5 mg total) by mouth every 8 (eight) hours as needed for severe pain., Disp: 90 tablet, Rfl: 0 .  OXYGEN, Inhale into the lungs. 2 liters at bedtime,  Disp: , Rfl:  .  pantoprazole (PROTONIX) 40 MG tablet, Take 1 tablet (40 mg total) by mouth daily., Disp: 30 tablet, Rfl: 0 .  potassium chloride SA (K-DUR,KLOR-CON) 20 MEQ tablet, Take 20 mEq by mouth 2 (two) times daily., Disp: , Rfl:  .  pregabalin (LYRICA) 75 MG capsule, Take 1 capsule (75 mg total) by mouth 3 (three) times daily., Disp: 90 capsule, Rfl: 1 .  sucralfate (CARAFATE) 1 G tablet, Take 1 tablet (1 g total) by mouth 4 (four) times daily -  with meals and at bedtime. (Patient taking differently: Take 1 g by mouth 2 (two) times daily. ), Disp: 120 tablet, Rfl: 0 .  tiotropium (SPIRIVA HANDIHALER) 18 MCG inhalation capsule, Place 18 mcg into inhaler and inhale daily., Disp: , Rfl:  .  tiZANidine (ZANAFLEX) 4 MG capsule, Take 1 capsule (4 mg total) by mouth 3 (three) times daily as needed for muscle spasms., Disp: 90 capsule,  Rfl: 2 .  oxyCODONE (OXY IR/ROXICODONE) 5 MG immediate release tablet, Take 1 tablet (5 mg total) by mouth every 8 (eight) hours as needed for severe pain., Disp: 90 tablet, Rfl: 0 .  [START ON 11/19/2017] oxyCODONE (OXY IR/ROXICODONE) 5 MG immediate release tablet, Take 1 tablet (5 mg total) by mouth every 8 (eight) hours as needed for severe pain., Disp: 90 tablet, Rfl: 0  ROS  Constitutional: Denies any fever or chills Gastrointestinal: No reported hemesis, hematochezia, vomiting, or acute GI distress Musculoskeletal: Denies any acute onset joint swelling, redness, loss of ROM, or weakness Neurological: No reported episodes of acute onset apraxia, aphasia, dysarthria, agnosia, amnesia, paralysis, loss of coordination, or loss of consciousness  Allergies  Ms. Bhavsar is allergic to tramadol and penicillins.  [place]  Drug: Ms. Laumann  reports that she does not use drugs. Alcohol:  reports that she does not drink alcohol. Tobacco:  reports that she quit smoking about 2 years ago. Her smoking use included cigarettes. She has a 36.00 pack-year smoking history. She has never used smokeless tobacco. Medical:  has a past medical history of (HFpEF) heart failure with preserved ejection fraction ([place]), Arthritis, Asthma, CHF (congestive heart failure) ([place]), COPD (chronic obstructive pulmonary disease) ([place]), Diabetes mellitus without complication ([place]), Gastric ulcer, Hyperlipidemia, Hypertension, and OSA (obstructive sleep apnea). Surgical: Ms. Mcweeney  has a past surgical history that includes Knee surgery (Left) and Flexible bronchoscopy (N/A, 06/20/2015). Family: family history includes Breast cancer in her mother; Diabetes in her maternal grandmother; Lung cancer in her father.  Constitutional Exam  General appearance: Well nourished, well developed, and well hydrated. In no apparent acute distress Vitals:   10/20/17 1326  BP: 130/81  Pulse: 83  Resp: 18  Temp: 98.4 F (36.9 C)  TempSrc: Oral   SpO2: 99%  Weight: 200 lb (90.7 kg)  Height: _0  (1.727 m)  Psych/Mental status: Alert, oriented x 3 (person, place, & time)       Eyes: PERLA Respiratory: No evidence of acute respiratory distress  Cervical Spine Area Exam  Skin & Axial Inspection: No masses, redness, edema, swelling, or associated skin lesions Alignment: Symmetrical Functional ROM: Unrestricted ROM      Stability: No instability detected Muscle Tone/Strength: Functionally intact. No obvious neuro-muscular anomalies detected. Sensory (Neurological): Unimpaired Palpation: No palpable anomalies              Upper Extremity (UE) Exam    Side: Right upper extremity  Side: Left upper extremity  Skin & Extremity Inspection: Skin color, temperature, and  hair growth are WNL. No peripheral edema or cyanosis. No masses, redness, swelling, asymmetry, or associated skin lesions. No contractures.  Skin & Extremity Inspection: Skin color, temperature, and hair growth are WNL. No peripheral edema or cyanosis. No masses, redness, swelling, asymmetry, or associated skin lesions. No contractures.  Functional ROM: Unrestricted ROM          Functional ROM: Unrestricted ROM          Muscle Tone/Strength: Functionally intact. No obvious neuro-muscular anomalies detected.  Muscle Tone/Strength: Functionally intact. No obvious neuro-muscular anomalies detected.  Sensory (Neurological): Unimpaired          Sensory (Neurological): Unimpaired          Palpation: No palpable anomalies              Palpation: No palpable anomalies              Specialized Test(s): Deferred         Specialized Test(s): Deferred          Thoracic Spine Area Exam  Skin & Axial Inspection: No masses, redness, or swelling Alignment: Symmetrical Functional ROM: Unrestricted ROM Stability: No instability detected Muscle Tone/Strength: Functionally intact. No obvious neuro-muscular anomalies detected. Sensory (Neurological): Unimpaired Muscle strength & Tone: No  palpable anomalies  Lumbar Spine Area Exam  Skin & Axial Inspection: No masses, redness, or swelling Alignment: Symmetrical Functional ROM: Unrestricted ROM      Stability: No instability detected Muscle Tone/Strength: Functionally intact. No obvious neuro-muscular anomalies detected. Sensory (Neurological): Unimpaired Palpation: Complains of area being tender to palpation       Provocative Tests: Lumbar Hyperextension and rotation test: evaluation deferred today       Lumbar Lateral bending test: evaluation deferred today       Patrick's Maneuver: evaluation deferred today                    Gait & Posture Assessment  Ambulation: Unassisted Gait: Relatively normal for age and body habitus Posture: WNL   Lower Extremity Exam    Side: Right lower extremity  Side: Left lower extremity  Skin & Extremity Inspection: Skin color, temperature, and hair growth are WNL. No peripheral edema or cyanosis. No masses, redness, swelling, asymmetry, or associated skin lesions. No contractures.  Skin & Extremity Inspection: Skin color, temperature, and hair growth are WNL. No peripheral edema or cyanosis. No masses, redness, swelling, asymmetry, or associated skin lesions. No contractures.  Functional ROM: Unrestricted ROM          Functional ROM: Unrestricted ROM          Muscle Tone/Strength: Functionally intact. No obvious neuro-muscular anomalies detected.  Muscle Tone/Strength: Functionally intact. No obvious neuro-muscular anomalies detected.  Sensory (Neurological): Unimpaired  Sensory (Neurological): Unimpaired  Palpation: No palpable anomalies  Palpation: No palpable anomalies   Assessment  Primary Diagnosis & Pertinent Problem List: The primary encounter diagnosis was Lumbar spondylosis. Diagnoses of Chronic sacroiliac joint pain (Bilateral) (R>L), Chronic knee pain (Tertiary source of pain) (Bilateral) (L>R), Chronic hip pain (Secondary source of pain) (Bilateral) (L>R), Chronic pain  syndrome, Peripheral neuropathy, idiopathic (upper and lower extremity), and Neurogenic pain were also pertinent to this visit.  Status Diagnosis  Controlled Controlled Controlled 1. Lumbar spondylosis   2. Chronic sacroiliac joint pain (Bilateral) (R>L)   3. Chronic knee pain [facility] source of pain) (Bilateral) (L>R)   4. Chronic hip pain (Secondary source of pain) (Bilateral) (L>R)   5. Chronic pain syndrome  6. Peripheral neuropathy, idiopathic (upper and lower extremity)   7. Neurogenic pain     Problems updated and reviewed during this visit: No problems updated. Plan of Care  Pharmacotherapy (Medications Ordered): Meds ordered this encounter  Medications  . oxyCODONE (OXY IR/ROXICODONE) 5 MG immediate release tablet    Sig: Take 1 tablet (5 mg total) by mouth every 8 (eight) hours as needed for severe pain.    Dispense:  90 tablet    Refill:  0    Do not place this medication, or any other prescription from our practice, on "Automatic Refill". Patient may have prescription filled one day early if pharmacy is closed on scheduled refill date. Do not fill until: 10/20/2017 To last until: 11/19/2017    Order Specific Question:   Supervising Provider    Answer:   Milinda Pointer [phone]  . pregabalin (LYRICA) 75 MG capsule    Sig: Take 1 capsule (75 mg total) by mouth 3 (three) times daily.    Dispense:  90 capsule    Refill:  1    Do not add this medication to the electronic "Automatic Refill" notification system. Patient may have prescription filled one day early if pharmacy is closed on scheduled refill date.    Order Specific Question:   Supervising Provider    Answer:   Milinda Pointer [phone]  . oxyCODONE (OXY IR/ROXICODONE) 5 MG immediate release tablet    Sig: Take 1 tablet (5 mg total) by mouth every 8 (eight) hours as needed for severe pain.    Dispense:  90 tablet    Refill:  0    Do not place this medication, or any other prescription from our practice, on  "Automatic Refill". Patient may have prescription filled one day early if pharmacy is closed on scheduled refill date. Do not fill until:11/19/2017 To last until: 12/19/2017    Order Specific Question:   Supervising Provider    Answer:   Milinda Pointer [phone]   New Prescriptions   No medications on file   Medications administered today: Ahlayah A. Lykins had no medications administered during this visit. Lab-work, procedure(s), and/or referral(s): No orders of the defined types were placed in this encounter.  Imaging and/or referral(s): None  Interventional therapies: Planned, scheduled, and/or pending: Bilateral Hyalgan knee injection and Palliative  bilateral lumbar facet block #3   Considering:  Diagnostic bilateral lumbar facet block #3 Possible bilateral lumbar facet radiofrequency ablation  Possible bilateral diagnostic sacroiliac joint block  Possible bilateral intra-articular hip joint injection  Possible bilateral intra-articular knee joint injection Possible bilateral genicular nerve block  Possible bilateral knee joint radiofrequency ablation.  Possible left-sided lumbar epidural steroid injection Possible right-sided cervical epidural steroid injection Possible bilateral diagnostic cervical facet block Possible bilateral diagnostic greater occipital nerve blocks Possible greater occipital nerve radiofrequency ablation.   Palliative PRN treatment(s):  Palliative bilateral lumbar facet radiofrequency ablation      Provider-requested follow-up: Return in about 6 weeks (around 12/01/2017) for MedMgmt with Me Dionisio David), in addition, Appointment As Scheduled.  Future Appointments  Date Time Provider [place]  10/21/2017  9:00 AM [facility] [place]  10/25/2017  1:00 PM Milinda Pointer, MD [facility] None  12/01/2017  9:45 AM Vevelyn Francois, NP [facility] None  12/26/2017  1:40 PM Alisa Graff, FNP [facility] None   Primary Care Physician:  Denton Lank, MD Location: [facility] Out[facility] Management Facility Note by: Vevelyn Francois NP Date: 10/20/2017; Time: 3:31 PM  Pain Score Disclaimer:  We use the NRS-11 scale. This is a self-reported, subjective measurement of pain severity with only modest accuracy. It is used primarily to identify changes within a particular patient. It must be understood that outpatient pain scales are significantly less accurate that those used for research, where they can be applied under ideal controlled circumstances with minimal exposure to variables. In reality, the score is likely to be a combination of pain intensity and pain affect, where pain affect describes the degree of emotional arousal or changes in action readiness caused by the sensory experience of pain. Factors such as social and work situation, setting, emotional state, anxiety levels, expectation, and prior pain experience may influence pain perception and show large inter-individual differences that may also be affected by time variables.  Patient instructions provided during this appointment: Patient Instructions  ____________________________________________________________________________________________  Medication Rules  Applies to: All patients receiving prescriptions (written or electronic).  Pharmacy of record: Pharmacy where electronic prescriptions will be sent. If written prescriptions are taken to a different pharmacy, please inform the nursing staff. The pharmacy listed in the electronic medical record should be the one where you would like electronic prescriptions to be sent.  Prescription refills: Only during scheduled appointments. Applies to both, written and electronic prescriptions.  NOTE: The following applies primarily to controlled substances (Opioid* Pain Medications).   Patient's responsibilities: 1. Pain Pills: Bring all pain pills to every appointment (except for procedure appointments). 2. Pill Bottles: Bring  pills in original pharmacy bottle. Always bring newest bottle. Bring bottle, even if empty. 3. Medication refills: You are responsible for knowing and keeping track of what medications you need refilled. The day before your appointment, write a list of all prescriptions that need to be refilled. Bring that list to your appointment and give it to the admitting nurse. Prescriptions will be written only during appointments. If you forget a medication, it will not be "Called in", "Faxed", or "electronically sent". You will need to get another appointment to get these prescribed. 4. Prescription Accuracy: You are responsible for carefully inspecting your prescriptions before leaving our office. Have the discharge nurse carefully go over each prescription with you, before taking them home. Make sure that your name is accurately spelled, that your address is correct. Check the name and dose of your medication to make sure it is accurate. Check the number of pills, and the written instructions to make sure they are clear and accurate. Make sure that you are given enough medication to last until your next medication refill appointment. 5. Taking Medication: Take medication as prescribed. Never take more pills than instructed. Never take medication more frequently than prescribed. Taking less pills or less frequently is permitted and encouraged, when it comes to controlled substances (written prescriptions).  6. Inform other Doctors: Always inform, all of your healthcare providers, of all the medications you take. 7. Pain Medication from other Providers: You are not allowed to accept any additional pain medication from any other Doctor or Healthcare provider. There are two exceptions to this rule. (see below) In the event that you require additional pain medication, you are responsible for notifying us, as stated below. 8. Medication Agreement: You are responsible for carefully reading and following our Medication  Agreement. This must be signed before receiving any prescriptions from our practice. Safely store a copy of your signed Agreement. Violations to the Agreement will result in no further prescriptions. (Additional copies of our Medication Agreement are available upon request.) 9. Laws, Rules, & Regulations: All patients  are expected to follow all Federal and [employer], [employer], Rules, [employer]. Ignorance of the Laws does not constitute a valid excuse. The use of any illegal substances is prohibited. 10. Adopted CDC guidelines & recommendations: Target dosing levels will be at or below 60 MME/day. Use of benzodiazepines** is not recommended.  Exceptions: There are only two exceptions to the rule of not receiving pain medications from other Healthcare Providers. 1. Exception #1 (Emergencies): In the event of an emergency (i.e.: accident requiring emergency care), you are allowed to receive additional pain medication. However, you are responsible for: As soon as you are able, call our office [phone], at any time of the day or night, and leave a message stating your name, the date and nature of the emergency, and the name and dose of the medication prescribed. In the event that your call is answered by a member of our staff, make sure to document and save the date, time, and the name of the person that took your information.  2. Exception #2 (Planned Surgery): In the event that you are scheduled by another doctor or dentist to have any type of surgery or procedure, you are allowed (for a period no longer than 30 days), to receive additional pain medication, for the acute post-op pain. However, in this case, you are responsible for picking up a copy of our "Post-op Pain Management for Surgeons" handout, and giving it to your surgeon or dentist. This document is available at our office, and does not require an appointment to obtain it. Simply go to our office during business hours (Monday-Thursday from  8:00 AM to 4:00 PM) (Friday 8:00 AM to 12:00 Noon) or if you have a scheduled appointment with [place], prior to your surgery, and ask for it by name. In addition, you will need to provide [place] with your name, name of your surgeon, type of surgery, and date of procedure or surgery.  *Opioid medications include: morphine, codeine, oxycodone, oxymorphone, hydrocodone, hydromorphone, meperidine, tramadol, tapentadol, buprenorphine, fentanyl, methadone. **Benzodiazepine medications include: diazepam (Valium), alprazolam (Xanax), clonazepam (Klonopine), lorazepam (Ativan), clorazepate (Tranxene), chlordiazepoxide (Librium), estazolam (Prosom), oxazepam (Serax), temazepam (Restoril), triazolam (Halcion) (Last updated: 09/15/2017) ____________________________________________________________________________________________

## 2017-10-20 NOTE — Patient Instructions (Signed)
____________________________________________________________________________________________  Medication Rules  Applies to: All patients receiving prescriptions (written or electronic).  Pharmacy of record: Pharmacy where electronic prescriptions will be sent. If written prescriptions are taken to a different pharmacy, please inform the nursing staff. The pharmacy listed in the electronic medical record should be the one where you would like electronic prescriptions to be sent.  Prescription refills: Only during scheduled appointments. Applies to both, written and electronic prescriptions.  NOTE: The following applies primarily to controlled substances (Opioid* Pain Medications).   Patient's responsibilities: 1. Pain Pills: Bring all pain pills to every appointment (except for procedure appointments). 2. Pill Bottles: Bring pills in original pharmacy bottle. Always bring newest bottle. Bring bottle, even if empty. 3. Medication refills: You are responsible for knowing and keeping track of what medications you need refilled. The day before your appointment, write a list of all prescriptions that need to be refilled. Bring that list to your appointment and give it to the admitting nurse. Prescriptions will be written only during appointments. If you forget a medication, it will not be "Called in", "Faxed", or "electronically sent". You will need to get another appointment to get these prescribed. 4. Prescription Accuracy: You are responsible for carefully inspecting your prescriptions before leaving our office. Have the discharge nurse carefully go over each prescription with you, before taking them home. Make sure that your name is accurately spelled, that your address is correct. Check the name and dose of your medication to make sure it is accurate. Check the number of pills, and the written instructions to make sure they are clear and accurate. Make sure that you are given enough medication to last  until your next medication refill appointment. 5. Taking Medication: Take medication as prescribed. Never take more pills than instructed. Never take medication more frequently than prescribed. Taking less pills or less frequently is permitted and encouraged, when it comes to controlled substances (written prescriptions).  6. Inform other Doctors: Always inform, all of your healthcare providers, of all the medications you take. 7. Pain Medication from other Providers: You are not allowed to accept any additional pain medication from any other Doctor or Healthcare provider. There are two exceptions to this rule. (see below) In the event that you require additional pain medication, you are responsible for notifying us, as stated below. 8. Medication Agreement: You are responsible for carefully reading and following our Medication Agreement. This must be signed before receiving any prescriptions from our practice. Safely store a copy of your signed Agreement. Violations to the Agreement will result in no further prescriptions. (Additional copies of our Medication Agreement are available upon request.) 9. Laws, Rules, & Regulations: All patients are expected to follow all Federal and State Laws, Statutes, Rules, & Regulations. Ignorance of the Laws does not constitute a valid excuse. The use of any illegal substances is prohibited. 10. Adopted CDC guidelines & recommendations: Target dosing levels will be at or below 60 MME/day. Use of benzodiazepines** is not recommended.  Exceptions: There are only two exceptions to the rule of not receiving pain medications from other Healthcare Providers. 1. Exception #1 (Emergencies): In the event of an emergency (i.e.: accident requiring emergency care), you are allowed to receive additional pain medication. However, you are responsible for: As soon as you are able, call our office [phone], at any time of the day or night, and leave a message stating your name, the  date and nature of the emergency, and the name and dose of the medication   prescribed. In the event that your call is answered by a member of our staff, make sure to document and save the date, time, and the name of the person that took your information.  2. Exception #2 (Planned Surgery): In the event that you are scheduled by another doctor or dentist to have any type of surgery or procedure, you are allowed (for a period no longer than 30 days), to receive additional pain medication, for the acute post-op pain. However, in this case, you are responsible for picking up a copy of our "Post-op Pain Management for Surgeons" handout, and giving it to your surgeon or dentist. This document is available at our office, and does not require an appointment to obtain it. Simply go to our office during business hours (Monday-Thursday from 8:00 AM to 4:00 PM) (Friday 8:00 AM to 12:00 Noon) or if you have a scheduled appointment with us, prior to your surgery, and ask for it by name. In addition, you will need to provide us with your name, name of your surgeon, type of surgery, and date of procedure or surgery.  *Opioid medications include: morphine, codeine, oxycodone, oxymorphone, hydrocodone, hydromorphone, meperidine, tramadol, tapentadol, buprenorphine, fentanyl, methadone. **Benzodiazepine medications include: diazepam (Valium), alprazolam (Xanax), clonazepam (Klonopine), lorazepam (Ativan), clorazepate (Tranxene), chlordiazepoxide (Librium), estazolam (Prosom), oxazepam (Serax), temazepam (Restoril), triazolam (Halcion) (Last updated: 09/15/2017) ____________________________________________________________________________________________    

## 2017-10-20 NOTE — Progress Notes (Signed)
Patient's Name: Erica [name]  MRN: [id number]  Referring Provider: Denton Lank, MD  DOB: 1966-07-07  PCP: Denton Lank, MD  DOS: 10/20/2017  Note by: Vevelyn Francois NP  Service setting: Ambulatory outpatient  Specialty: Interventional Pain Management  Location: [facility] (AMB) Pain Management Facility    Patient type: Established    Primary Reason(s) for Visit: Encounter for prescription drug management. (Level of risk: moderate)  CC: Back Pain (low) and Hip Pain (left)  HPI  Ms. Hantz is a 52 y.o. year old, female patient, who comes today for a medication management evaluation. She has Chest pain; Hyponatremia; Shortness of breath; Swelling; Cough; COPD (chronic obstructive pulmonary disease) with acute bronchitis ([place]); Renal insufficiency; Pulmonary HTN ([place]); COPD, mild ([place]); Chronic diastolic heart failure ([place]); Smoker; Obstructive sleep apnea; Tachycardia; Depression; Long term current use of opiate analgesic; Long term prescription opiate use; Opiate use; Chronic low back pain (Primary Source of Pain) (Bilateral) (L>R); Chronic knee pain [facility] source of pain) (Bilateral) (L>R); Chronic neck pain (Bilateral) (R>L); Chronic upper back pain (midline); Chronic foot pain (bottom of feet) (Bilateral) (R>L); Chronic hand pain (Bilateral); Peripheral neuropathy, idiopathic (upper and lower extremity); Chronic sacroiliac joint pain (Bilateral) (R>L); Lumbar facet syndrome (Bilateral) (R>L); Chronic pain syndrome; Elevated sedimentation rate; Elevated C-reactive protein (CRP); Neurogenic pain; Vitamin D deficiency; Lumbar spondylosis; Osteoarthritis of knee (Bilateral) (L>R); Intermittent left thoracic Muscle cramps; Spasm of thoracolumbar muscle (Left); Post-traumatic osteoarthritis of knee (Left); Hemarthrosis, left knee; 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); Chronic hip pain (Secondary source of pain) (Bilateral) (L>R); Diabetes ([place]); HTN (hypertension); Vertigo; and Spondylosis without myelopathy  or radiculopathy, lumbosacral region on their problem list. Her primarily concern today is the Back Pain (low) and Hip Pain (left)  Pain Assessment: Location: Lower Back Radiating: left hip Onset: More than a month ago Duration: Chronic pain Quality: Aching, Constant, Throbbing Severity: 4 /10 (self-reported pain score)  Note: Reported level is compatible with observation.                          Effect on ADL:   Timing: Constant Modifying factors: medications, procedures, rest  Ms. Hickling was last scheduled for an appointment on 09/14/2017 for medication management. During today's appointment we reviewed Ms. Dolinger's chronic pain status, as well as her outpatient medication regimen. She admits that she has occasional leg pain that goes down into her foot. She feels like her left hip is coming and going in and out of the socket. She denies any falls or injuries. She states that she is "double jointed". She admits that this has been over the last week.  The patient  reports that she does not use drugs. Her body mass index is 30.41 kg/m.  Further details on both, my assessment(s), as well as the proposed treatment plan, please see below.  Controlled Substance Pharmacotherapy Assessment REMS (Risk Evaluation and Mitigation Strategy)  Analgesic:Oxycodone IR 5 mg 1 tablet by mouth every 8 hours (48m/dayof oxycodone) (22.5MME/day) MME/day:22.541mday.    ShHart RochesterRN  10/20/2017  1:33 PM  Sign at close encounter Nursing Pain Medication Assessment:  Safety precautions to be maintained throughout the outpatient stay will include: orient to surroundings, keep bed in low position, maintain call bell within reach at all times, provide assistance with transfer out of bed and ambulation.  Medication Inspection Compliance: Pill count conducted under aseptic conditions, in front of the patient. Neither the pills nor the bottle was removed from the patient's  sight at any time. Once count  was completed pills were immediately returned to the patient in their original bottle.  Medication: Oxycodone IR Pill/Patch Count: 0 of 90 pills remain Pill/Patch Appearance: Markings consistent with prescribed medication Bottle Appearance: Standard pharmacy container. Clearly labeled. Filled Date: 02 / 27 / 2019 Last Medication intake:  Ran out of medicine more than 48 hours ago Pharmacokinetics: Liberation and absorption (onset of action): WNL Distribution (time to peak effect): WNL Metabolism and excretion (duration of action): WNL         Pharmacodynamics: Desired effects: Analgesia: Ms. Gutierres reports >50% benefit. Functional ability: Patient reports that medication allows her to accomplish basic ADLs Clinically meaningful improvement in function (CMIF): Sustained CMIF goals met Perceived effectiveness: Described as relatively effective, allowing for increase in activities of daily living (ADL) Undesirable effects: Side-effects or Adverse reactions: None reported Monitoring: [place] PMP: Online review of the past 17-monthperiod conducted. Compliant with practice rules and regulations Last UDS on record: Summary  Date Value Ref Range Status  09/14/2017 FINAL  Final    Comment:    ==================================================================== TOXASSURE SELECT 13 (MW) ==================================================================== Test                             Result       Flag       Units Drug Absent but Declared for Prescription Verification   Oxycodone                      Not Detected UNEXPECTED ng/mg creat ==================================================================== Test                      Result    Flag   Units      Ref Range   Creatinine              35               mg/dL      >=20 ==================================================================== Declared Medications:  The flagging and interpretation on this report are based on the  following  declared medications.  Unexpected results may arise from  inaccuracies in the declared medications.  **Note: The testing scope of this panel includes these medications:  Oxycodone  **Note: The testing scope of this panel does not include following  reported medications:  Albuterol  Aspirin (Aspirin 81)  Atorvastatin  Diltiazem  Duloxetine  Fluticasone  Folic acid  Furosemide  Losartan (Losartan Potassium)  Magnesium Oxide  Metformin  Oxygen  Pantoprazole  Potassium  Pregabalin  Salmeterol  Sucralfate  Tiotropium  Tizanidine  Vitamin D3 ==================================================================== For clinical consultation, please call [phone]====    UDS interpretation: Compliant          Medication Assessment Form: Reviewed. Patient indicates being compliant with therapy Treatment compliance: Compliant Risk Assessment Profile: Aberrant behavior: See prior evaluations. None observed or detected today Comorbid factors increasing risk of overdose: See prior notes. No additional risks detected today Risk of substance use disorder (SUD): Low Opioid Risk Tool - 09/14/17 1310      Family History of Substance Abuse   Alcohol  Negative    Illegal Drugs  Negative    Rx Drugs  Negative      Personal History of Substance Abuse   Alcohol  Negative    Illegal Drugs  Negative    Rx Drugs  Negative      Psychological Disease  Psychological Disease  Positive    ADD  Negative    OCD  Negative    Bipolar  Negative    Schizophrenia  Negative    Depression  Positive patient taking cymbalta for depression and states that it is  working   patient taking cymbalta for depression and states that it is  working     Total Score   Opioid Risk Tool Scoring  3    Opioid Risk Interpretation  Low Risk      ORT Scoring interpretation table:  Score <3 = Low Risk for SUD  Score between 4-7 = Moderate Risk  for SUD  Score >8 = High Risk for Opioid Abuse   Risk Mitigation Strategies:  Patient Counseling: Covered Patient-Prescriber Agreement (PPA): Present and active  Notification to other healthcare providers: Done  Pharmacologic Plan: No change in therapy, at this time.             Laboratory Chemistry  Inflammation Markers (CRP: Acute Phase) (ESR: Chronic Phase) Lab Results  Component Value Date   CRP 1.3 (H) 04/30/2016   ESRSEDRATE 31 (H) 04/30/2016                         Rheumatology Markers Lab Results  Component Value Date   RF <10.0 06/29/2016   ANA Negative 06/29/2016   LABURIC 7.1 (H) 06/19/2015                        Renal Function Markers Lab Results  Component Value Date   BUN 9 09/22/2017   CREATININE 0.87 09/22/2017   GFRAA >60 09/22/2017   GFRNONAA >60 09/22/2017                              Hepatic Function Markers Lab Results  Component Value Date   AST 27 06/14/2016   ALT 26 06/14/2016   ALBUMIN 4.2 06/14/2016   ALKPHOS 83 06/14/2016   LIPASE 90 02/10/2013                        Electrolytes Lab Results  Component Value Date   NA 138 09/22/2017   K 3.9 09/22/2017   CL 97 (L) 09/22/2017   CALCIUM 9.4 09/22/2017   MG 1.9 04/30/2016                        Neuropathy Markers Lab Results  Component Value Date   VITAMINB12 248 04/30/2016   HGBA1C 6.2 (H) 06/14/2015   HIV NON REACTIVE 06/14/2015                        Bone Pathology Markers Lab Results  Component Value Date   25OHVITD1 15 (L) 04/30/2016   25OHVITD2 <1.0 04/30/2016   25OHVITD3 15 04/30/2016                         Coagulation Parameters Lab Results  Component Value Date   PLT 280 04/08/2017                        Cardiovascular Markers Lab Results  Component Value Date   BNP 42.0 06/14/2016   CKTOTAL 56 03/10/2013   CKMB 0.7 03/10/2013   TROPONINI <0.03 06/14/2016   HGB 13.4 04/08/2017  HCT 39.9 04/08/2017                         CA Markers No results  found for: CEA, CA125, LABCA2                      Note: Lab results reviewed.  Recent Diagnostic Imaging Results  DG Lumbar Spine Complete CLINICAL DATA:  Low back pain after a fall  EXAM: LUMBAR SPINE - COMPLETE 4+ VIEW  COMPARISON:  04/30/2016  FINDINGS: Calcified pelvic phleboliths. Lumbar alignment is within normal limits. Moderate degenerative changes at L5-S1, diffuse degenerative changes elsewhere in the spine with osteophytes and mild disc space narrowing. Vertebral body heights are maintained. Atherosclerotic vascular calcification.  IMPRESSION: Mild to moderate degenerative changes. No acute osseous abnormality.  Electronically Signed   By: Donavan Foil M.D.   On: 04/08/2017 22:36 DG Chest 2 View CLINICAL DATA:  Dizziness since blood pressure medication change. Also shortness of breath several months.  EXAM: CHEST  2 VIEW  COMPARISON:  06/14/2016  FINDINGS: Lungs are adequately inflated without focal consolidation or effusion. Cardiomediastinal silhouette, bones and soft tissues are within normal.  IMPRESSION: No active cardiopulmonary disease.  Electronically Signed   By: Marin Olp M.D.   On: 04/08/2017 19:56  Complexity Note: Imaging results reviewed. Results shared with Ms. Melina Copa, using [employer].                         Meds   Current Outpatient Medications:  .  albuterol (PROAIR HFA) 108 (90 Base) MCG/ACT inhaler, Inhale into the lungs 4 (four) times daily., Disp: , Rfl:  .  amoxicillin-clavulanate (AUGMENTIN) 875-125 MG tablet, Take 1 tablet by mouth 2 (two) times daily. for 10 days, Disp: , Rfl: 0 .  aspirin EC 81 MG EC tablet, Take 1 tablet (81 mg total) by mouth daily., Disp: 30 tablet, Rfl: 0 .  atorvastatin (LIPITOR) 80 MG tablet, Take 80 mg by mouth daily., Disp: , Rfl:  .  Cholecalciferol (VITAMIN D3) 2000 units capsule, Take 1 capsule (2,000 Units total) by mouth daily., Disp: 30 capsule, Rfl: PRN .  diltiazem (CARDIZEM CD)  120 MG 24 hr capsule, Take 1 capsule (120 mg total) by mouth daily., Disp: 30 capsule, Rfl: 11 .  diltiazem (CARDIZEM) 30 MG tablet, Take 1 tablet (30 mg total) by mouth 3 (three) times daily as needed., Disp: 90 tablet, Rfl: 3 .  DULoxetine (CYMBALTA) 30 MG capsule, Take 30 mg by mouth., Disp: , Rfl:  .  DULoxetine (CYMBALTA) 60 MG capsule, Take 60 mg by mouth daily., Disp: , Rfl:  .  fluconazole (DIFLUCAN) 150 MG tablet, TAKE 1 TABLET NOW FOR YEAST INFECTION, MAY REPEAT IN 3 DAYS IF SYMPTOMS PERSIST, Disp: , Rfl: 0 .  Fluticasone-Salmeterol (ADVAIR DISKUS IN), Inhale into the lungs 2 (two) times daily., Disp: , Rfl:  .  folic acid (FOLVITE) 1 MG tablet, Take 1 mg by mouth daily., Disp: , Rfl:  .  furosemide (LASIX) 40 MG tablet, Take 40 mg by mouth 2 (two) times daily. Take an additional 40 mg in the am for weight gain as needed, Disp: , Rfl:  .  losartan (COZAAR) 100 MG tablet, Take 1/2 tablet (50 mg) by mouth twice daily, Disp: , Rfl:  .  Magnesium Oxide, Antacid, 500 MG CAPS, once daily., Disp: , Rfl:  .  metFORMIN (GLUCOPHAGE) 500 MG tablet, Take 500  mg by mouth 2 (two) times daily with a meal., Disp: , Rfl:  .  oxyCODONE (OXY IR/ROXICODONE) 5 MG immediate release tablet, Take 1 tablet (5 mg total) by mouth every 8 (eight) hours as needed for severe pain., Disp: 90 tablet, Rfl: 0 .  OXYGEN, Inhale into the lungs. 2 liters at bedtime, Disp: , Rfl:  .  pantoprazole (PROTONIX) 40 MG tablet, Take 1 tablet (40 mg total) by mouth daily., Disp: 30 tablet, Rfl: 0 .  potassium chloride SA (K-DUR,KLOR-CON) 20 MEQ tablet, Take 20 mEq by mouth 2 (two) times daily., Disp: , Rfl:  .  pregabalin (LYRICA) 75 MG capsule, Take 1 capsule (75 mg total) by mouth 3 (three) times daily., Disp: 90 capsule, Rfl: 0 .  sucralfate (CARAFATE) 1 G tablet, Take 1 tablet (1 g total) by mouth 4 (four) times daily -  with meals and at bedtime. (Patient taking differently: Take 1 g by mouth 2 (two) times daily. ), Disp: 120  tablet, Rfl: 0 .  tiotropium (SPIRIVA HANDIHALER) 18 MCG inhalation capsule, Place 18 mcg into inhaler and inhale daily., Disp: , Rfl:  .  tiZANidine (ZANAFLEX) 4 MG capsule, Take 1 capsule (4 mg total) by mouth 3 (three) times daily as needed for muscle spasms., Disp: 90 capsule, Rfl: 2 .  oxyCODONE (OXY IR/ROXICODONE) 5 MG immediate release tablet, Take 1 tablet (5 mg total) by mouth every 8 (eight) hours as needed for severe pain., Disp: 90 tablet, Rfl: 0 .  oxyCODONE (OXY IR/ROXICODONE) 5 MG immediate release tablet, Take 1 tablet (5 mg total) by mouth every 8 (eight) hours as needed for severe pain., Disp: 90 tablet, Rfl: 0  ROS  Constitutional: Denies any fever or chills Gastrointestinal: No reported hemesis, hematochezia, vomiting, or acute GI distress Musculoskeletal: Denies any acute onset joint swelling, redness, loss of ROM, or weakness Neurological: No reported episodes of acute onset apraxia, aphasia, dysarthria, agnosia, amnesia, paralysis, loss of coordination, or loss of consciousness  Allergies  Ms. Sumler is allergic to tramadol and penicillins.  [place]  Drug: Ms. Treptow  reports that she does not use drugs. Alcohol:  reports that she does not drink alcohol. Tobacco:  reports that she quit smoking about 2 years ago. Her smoking use included cigarettes. She has a 36.00 pack-year smoking history. She has never used smokeless tobacco. Medical:  has a past medical history of (HFpEF) heart failure with preserved ejection fraction ([place]), Arthritis, Asthma, CHF (congestive heart failure) ([place]), COPD (chronic obstructive pulmonary disease) ([place]), Diabetes mellitus without complication ([place]), Gastric ulcer, Hyperlipidemia, Hypertension, and OSA (obstructive sleep apnea). Surgical: Ms. Fines  has a past surgical history that includes Knee surgery (Left) and Flexible bronchoscopy (N/A, 06/20/2015). Family: family history includes Breast cancer in her mother; Diabetes in her maternal  grandmother; Lung cancer in her father.  Constitutional Exam  General appearance: Well nourished, well developed, and well hydrated. In no apparent acute distress Vitals:   10/20/17 1326  BP: 130/81  Pulse: 83  Resp: 18  Temp: 98.4 F (36.9 C)  TempSrc: Oral  SpO2: 99%  Weight: 200 lb (90.7 kg)  Height: _0  (1.727 m)   BMI Assessment: Estimated body mass index is 30.41 kg/m as calculated from the following:   Height as of this encounter: _1  (1.727 m).   Weight as of this encounter: 200 lb (90.7 kg).  BMI interpretation table: BMI level Category Range association with higher incidence of chronic pain  <18 kg/m2 Underweight   18.5-24.9  kg/m2 Ideal body weight   25-29.9 kg/m2 Overweight Increased incidence by 20%  30-34.9 kg/m2 Obese (Class I) Increased incidence by 68%  35-39.9 kg/m2 Severe obesity (Class II) Increased incidence by 136%  >40 kg/m2 Extreme obesity (Class III) Increased incidence by 254%   BMI Readings from Last 4 Encounters:  10/20/17 30.41 kg/m  10/06/17 30.41 kg/m  09/22/17 31.32 kg/m  09/15/17 30.73 kg/m   Wt Readings from Last 4 Encounters:  10/20/17 200 lb (90.7 kg)  10/06/17 200 lb (90.7 kg)  09/22/17 206 lb (93.4 kg)  09/15/17 202 lb 2 oz (91.7 kg)  Psych/Mental status: Alert, oriented x 3 (person, place, & time)       Eyes: PERLA Respiratory: No evidence of acute respiratory distress  Cervical Spine Area Exam  Skin & Axial Inspection: No masses, redness, edema, swelling, or associated skin lesions Alignment: Symmetrical Functional ROM: Unrestricted ROM      Stability: No instability detected Muscle Tone/Strength: Functionally intact. No obvious neuro-muscular anomalies detected. Sensory (Neurological): Unimpaired Palpation: No palpable anomalies              Upper Extremity (UE) Exam    Side: Right upper extremity  Side: Left upper extremity  Skin & Extremity Inspection: Skin color, temperature, and hair growth are WNL. No  peripheral edema or cyanosis. No masses, redness, swelling, asymmetry, or associated skin lesions. No contractures.  Skin & Extremity Inspection: Skin color, temperature, and hair growth are WNL. No peripheral edema or cyanosis. No masses, redness, swelling, asymmetry, or associated skin lesions. No contractures.  Functional ROM: Unrestricted ROM          Functional ROM: Unrestricted ROM          Muscle Tone/Strength: Functionally intact. No obvious neuro-muscular anomalies detected.  Muscle Tone/Strength: Functionally intact. No obvious neuro-muscular anomalies detected.  Sensory (Neurological): Unimpaired          Sensory (Neurological): Unimpaired          Palpation: No palpable anomalies              Palpation: No palpable anomalies              Specialized Test(s): Deferred         Specialized Test(s): Deferred          Thoracic Spine Area Exam  Skin & Axial Inspection: No masses, redness, or swelling Alignment: Symmetrical Functional ROM: Unrestricted ROM Stability: No instability detected Muscle Tone/Strength: Functionally intact. No obvious neuro-muscular anomalies detected. Sensory (Neurological): Unimpaired Muscle strength & Tone: No palpable anomalies  Lumbar Spine Area Exam  Skin & Axial Inspection: No masses, redness, or swelling Alignment: Symmetrical Functional ROM: Unrestricted ROM      Stability: No instability detected Muscle Tone/Strength: Functionally intact. No obvious neuro-muscular anomalies detected. Sensory (Neurological): Unimpaired Palpation: No palpable anomalies       Provocative Tests: Lumbar Hyperextension and rotation test: evaluation deferred today       Lumbar Lateral bending test: evaluation deferred today       Patrick's Maneuver: evaluation deferred today                    Gait & Posture Assessment  Ambulation: Unassisted Gait: Relatively normal for age and body habitus Posture: WNL   Lower Extremity Exam    Side: Right lower extremity  Side:  Left lower extremity  Skin & Extremity Inspection: Skin color, temperature, and hair growth are WNL. No peripheral edema or cyanosis. No masses, redness, swelling,  asymmetry, or associated skin lesions. No contractures.  Skin & Extremity Inspection: Skin color, temperature, and hair growth are WNL. No peripheral edema or cyanosis. No masses, redness, swelling, asymmetry, or associated skin lesions. No contractures.  Functional ROM: Unrestricted ROM          Functional ROM: Unrestricted ROM          Muscle Tone/Strength: Functionally intact. No obvious neuro-muscular anomalies detected.  Muscle Tone/Strength: Functionally intact. No obvious neuro-muscular anomalies detected.  Sensory (Neurological): Unimpaired  Sensory (Neurological): Unimpaired  Palpation: No palpable anomalies  Palpation: No palpable anomalies   Assessment  Primary Diagnosis & Pertinent Problem List: The primary encounter diagnosis was Lumbar spondylosis. Diagnoses of Chronic sacroiliac joint pain (Bilateral) (R>L), Chronic knee pain (Tertiary source of pain) (Bilateral) (L>R), Chronic hip pain (Secondary source of pain) (Bilateral) (L>R), and Chronic pain syndrome were also pertinent to this visit.  Status Diagnosis  Controlled Controlled Controlled 1. Lumbar spondylosis   2. Chronic sacroiliac joint pain (Bilateral) (R>L)   3. Chronic knee pain [facility] source of pain) (Bilateral) (L>R)   4. Chronic hip pain (Secondary source of pain) (Bilateral) (L>R)   5. Chronic pain syndrome     Problems updated and reviewed during this visit: No problems updated. Plan of Care  Pharmacotherapy (Medications Ordered): No orders of the defined types were placed in this encounter.  New Prescriptions   No medications on file   Medications administered today: Jordon A. Krawczyk had no medications administered during this visit. Lab-work, procedure(s), and/or referral(s): No orders of the defined types were placed in this  encounter.  Imaging and/or referral(s): None  Interventional therapies: Planned, scheduled, and/or pending: As scheduled   Considering:  Diagnostic bilateral lumbar facet block #3 Possible bilateral lumbar facet radiofrequency ablation  Possible bilateral diagnostic sacroiliac joint block  Possible bilateral intra-articular hip joint injection  Possible bilateral intra-articular knee joint injection Possible bilateral genicular nerve block  Possible bilateral knee joint radiofrequency ablation.  Possible left-sided lumbar epidural steroid injection Possible right-sided cervical epidural steroid injection Possible bilateral diagnostic cervical facet block Possible bilateral diagnostic greater occipital nerve blocks Possible greater occipital nerve radiofrequency ablation.   Palliative PRN treatment(s):  Palliative bilateral lumbar facet radiofrequency ablation   Provider-requested follow-up: Return in about 1 month (around 11/17/2017) for MedMgmt with Me Donella Stade Edison Pace).  Future Appointments  Date Time Provider [place]  10/21/2017  9:00 AM [facility] [place]  10/25/2017  1:00 PM Milinda Pointer, MD [facility] None  12/26/2017  1:40 PM Alisa Graff, [place] [facility] None   Primary Care Physician: Denton Lank, MD Location: [facility] Note by: Vevelyn Francois NP Date: 10/20/2017; Time: 1:41 PM  Pain Score Disclaimer: We use the NRS-11 scale. This is a self-reported, subjective measurement of pain severity with only modest accuracy. It is used primarily to identify changes within a particular patient. It must be understood that outpatient pain scales are significantly less accurate that those used for research, where they can be applied under ideal controlled circumstances with minimal exposure to variables. In reality, the score is likely to be a combination of pain intensity and pain affect, where pain affect describes the degree of  emotional arousal or changes in action readiness caused by the sensory experience of pain. Factors such as social and work situation, setting, emotional state, anxiety levels, expectation, and prior pain experience may influence pain perception and show large inter-individual differences that may also be affected by time variables.  Patient  instructions provided during this appointment: Patient Instructions  ____________________________________________________________________________________________  Medication Rules  Applies to: All patients receiving prescriptions (written or electronic).  Pharmacy of record: Pharmacy where electronic prescriptions will be sent. If written prescriptions are taken to a different pharmacy, please inform the nursing staff. The pharmacy listed in the electronic medical record should be the one where you would like electronic prescriptions to be sent.  Prescription refills: Only during scheduled appointments. Applies to both, written and electronic prescriptions.  NOTE: The following applies primarily to controlled substances (Opioid* Pain Medications).   Patient's responsibilities: 1. Pain Pills: Bring all pain pills to every appointment (except for procedure appointments). 2. Pill Bottles: Bring pills in original pharmacy bottle. Always bring newest bottle. Bring bottle, even if empty. 3. Medication refills: You are responsible for knowing and keeping track of what medications you need refilled. The day before your appointment, write a list of all prescriptions that need to be refilled. Bring that list to your appointment and give it to the admitting nurse. Prescriptions will be written only during appointments. If you forget a medication, it will not be "Called in", "Faxed", or "electronically sent". You will need to get another appointment to get these prescribed. 4. Prescription Accuracy: You are responsible for carefully inspecting your prescriptions before  leaving our office. Have the discharge nurse carefully go over each prescription with you, before taking them home. Make sure that your name is accurately spelled, that your address is correct. Check the name and dose of your medication to make sure it is accurate. Check the number of pills, and the written instructions to make sure they are clear and accurate. Make sure that you are given enough medication to last until your next medication refill appointment. 5. Taking Medication: Take medication as prescribed. Never take more pills than instructed. Never take medication more frequently than prescribed. Taking less pills or less frequently is permitted and encouraged, when it comes to controlled substances (written prescriptions).  6. Inform other Doctors: Always inform, all of your healthcare providers, of all the medications you take. 7. Pain Medication from other Providers: You are not allowed to accept any additional pain medication from any other Doctor or Healthcare provider. There are two exceptions to this rule. (see below) In the event that you require additional pain medication, you are responsible for notifying us, as stated below. 8. Medication Agreement: You are responsible for carefully reading and following our Medication Agreement. This must be signed before receiving any prescriptions from our practice. Safely store a copy of your signed Agreement. Violations to the Agreement will result in no further prescriptions. (Additional copies of our Medication Agreement are available upon request.) 9. Laws, Rules, & Regulations: All patients are expected to follow all Federal and [employer], [employer], Rules, [employer]. Ignorance of the Laws does not constitute a valid excuse. The use of any illegal substances is prohibited. 10. Adopted CDC guidelines & recommendations: Target dosing levels will be at or below 60 MME/day. Use of benzodiazepines** is not recommended.  Exceptions: There are only  two exceptions to the rule of not receiving pain medications from other Healthcare Providers. 1. Exception #1 (Emergencies): In the event of an emergency (i.e.: accident requiring emergency care), you are allowed to receive additional pain medication. However, you are responsible for: As soon as you are able, call our office [phone], at any time of the day or night, and leave a message stating your name, the date and nature of the emergency,  and the name and dose of the medication prescribed. In the event that your call is answered by a member of our staff, make sure to document and save the date, time, and the name of the person that took your information.  2. Exception #2 (Planned Surgery): In the event that you are scheduled by another doctor or dentist to have any type of surgery or procedure, you are allowed (for a period no longer than 30 days), to receive additional pain medication, for the acute post-op pain. However, in this case, you are responsible for picking up a copy of our "Post-op Pain Management for Surgeons" handout, and giving it to your surgeon or dentist. This document is available at our office, and does not require an appointment to obtain it. Simply go to our office during business hours (Monday-Thursday from 8:00 AM to 4:00 PM) (Friday 8:00 AM to 12:00 Noon) or if you have a scheduled appointment with [place], prior to your surgery, and ask for it by name. In addition, you will need to provide [place] with your name, name of your surgeon, type of surgery, and date of procedure or surgery.  *Opioid medications include: morphine, codeine, oxycodone, oxymorphone, hydrocodone, hydromorphone, meperidine, tramadol, tapentadol, buprenorphine, fentanyl, methadone. **Benzodiazepine medications include: diazepam (Valium), alprazolam (Xanax), clonazepam (Klonopine), lorazepam (Ativan), clorazepate (Tranxene), chlordiazepoxide (Librium), estazolam (Prosom), oxazepam (Serax), temazepam (Restoril),  triazolam (Halcion) (Last updated: 09/15/2017) ____________________________________________________________________________________________

## 2017-10-20 NOTE — Telephone Encounter (Signed)
Spoke with patient and reviewed all instructions for her stress test tomorrow. She verbalized understanding with no further questions at this time.

## 2017-10-21 ENCOUNTER — Ambulatory Visit
Admission: RE | Admit: 2017-10-21 | Discharge: 2017-10-21 | Disposition: A | Discharge status: Home / Self Care | Payer: Medicaid Other | Source: Ambulatory Visit | Attending: Nurse Practitioner | Admitting: Nurse Practitioner

## 2017-10-21 DIAGNOSIS — R079 Chest pain, unspecified: Secondary | ICD-10-CM | POA: Insufficient documentation

## 2017-10-21 LAB — NM MYOCAR MULTI W/SPECT W/WALL MOTION / EF
CHL CUP MPHR: 167 {beats}/min
CHL CUP NUCLEAR SDS: 2
CHL CUP NUCLEAR SRS: 8
CHL CUP RESTING HR STRESS: 71 {beats}/min
CSEPED: 0 min
CSEPEW: 1 METS
Exercise duration (sec): 0 s
LV dias vol: 33 mL (ref 46–106)
LV sys vol: 27 mL
NUC STRESS TID: 0.95
Peak HR: 96 {beats}/min
Percent HR: 57 %
SSS: 9

## 2017-10-21 MED ORDER — REGADENOSON 0.4 MG/5ML IV SOLN
0.4000 mg | Freq: Once | INTRAVENOUS | Status: AC
  Administered 2017-10-21: 0.4 mg via INTRAVENOUS

## 2017-10-21 MED ORDER — TECHNETIUM TC 99M TETROFOSMIN IV KIT
29.9700 | PACK | Freq: Once | INTRAVENOUS | Status: AC | PRN
  Administered 2017-10-21: 29.97 via INTRAVENOUS

## 2017-10-21 MED ORDER — TECHNETIUM TC 99M TETROFOSMIN IV KIT
10.0000 | PACK | Freq: Once | INTRAVENOUS | Status: AC | PRN
Start: 2017-10-21 — End: 2017-10-21
  Administered 2017-10-21: 13.52 via INTRAVENOUS

## 2017-10-24 ENCOUNTER — Telehealth: Payer: Self-pay | Admitting: Nurse Practitioner

## 2017-10-24 NOTE — Telephone Encounter (Signed)
Called patient to confirm which medicine.  Oxycodone 5 mg needs PA.  [facility].

## 2017-10-24 NOTE — Telephone Encounter (Signed)
Needs prior auth on meds, please call when she can pick up meds

## 2017-10-25 ENCOUNTER — Encounter: Payer: Self-pay | Admitting: Family Medicine

## 2017-10-25 ENCOUNTER — Ambulatory Visit: Payer: Medicaid Other | Admitting: Pain Medicine

## 2017-10-25 ENCOUNTER — Ambulatory Visit
Admission: RE | Admit: 2017-10-25 | Discharge: 2017-10-25 | Disposition: A | Discharge status: Home / Self Care | Payer: Medicaid Other | Source: Ambulatory Visit | Attending: Pain Medicine | Admitting: Pain Medicine

## 2017-10-25 ENCOUNTER — Encounter: Payer: Self-pay | Admitting: Pain Medicine

## 2017-10-25 ENCOUNTER — Other Ambulatory Visit: Payer: Self-pay

## 2017-10-25 VITALS — BP 133/68 | HR 79 | Temp 97.5°F | Resp 18 | Ht 68.0 in | Wt 200.0 lb

## 2017-10-25 DIAGNOSIS — Z79899 Other long term (current) drug therapy: Secondary | ICD-10-CM | POA: Insufficient documentation

## 2017-10-25 DIAGNOSIS — M47816 Spondylosis without myelopathy or radiculopathy, lumbar region: Secondary | ICD-10-CM | POA: Diagnosis not present

## 2017-10-25 DIAGNOSIS — M25561 Pain in right knee: Secondary | ICD-10-CM

## 2017-10-25 DIAGNOSIS — G8929 Other chronic pain: Secondary | ICD-10-CM

## 2017-10-25 DIAGNOSIS — M47817 Spondylosis without myelopathy or radiculopathy, lumbosacral region: Secondary | ICD-10-CM

## 2017-10-25 DIAGNOSIS — M545 Low back pain: Secondary | ICD-10-CM

## 2017-10-25 DIAGNOSIS — Z7984 Long term (current) use of oral hypoglycemic drugs: Secondary | ICD-10-CM | POA: Insufficient documentation

## 2017-10-25 DIAGNOSIS — Z79891 Long term (current) use of opiate analgesic: Secondary | ICD-10-CM | POA: Diagnosis not present

## 2017-10-25 DIAGNOSIS — M170 Bilateral primary osteoarthritis of knee: Secondary | ICD-10-CM | POA: Insufficient documentation

## 2017-10-25 DIAGNOSIS — Z888 Allergy status to other drugs, medicaments and biological substances status: Secondary | ICD-10-CM | POA: Insufficient documentation

## 2017-10-25 DIAGNOSIS — M25562 Pain in left knee: Secondary | ICD-10-CM

## 2017-10-25 DIAGNOSIS — Z880 Allergy status to penicillin: Secondary | ICD-10-CM | POA: Insufficient documentation

## 2017-10-25 MED ORDER — LIDOCAINE HCL 2 % IJ SOLN
20.0000 mL | Freq: Once | INTRAMUSCULAR | Status: AC
  Administered 2017-10-25: 400 mg
  Filled 2017-10-25: qty 100

## 2017-10-25 MED ORDER — SODIUM HYALURONATE (VISCOSUP) 20 MG/2ML IX SOSY
2.0000 mL | PREFILLED_SYRINGE | Freq: Once | INTRA_ARTICULAR | Status: AC
  Administered 2017-10-25: 14:00:00 via INTRA_ARTICULAR
  Filled 2017-10-25: qty 2

## 2017-10-25 MED ORDER — LIDOCAINE HCL (PF) 1 % IJ SOLN
4.0000 mL | Freq: Once | INTRAMUSCULAR | Status: AC
  Administered 2017-10-25: 5 mL
  Filled 2017-10-25: qty 5

## 2017-10-25 MED ORDER — FENTANYL CITRATE (PF) 100 MCG/2ML IJ SOLN
25.0000 ug | INTRAMUSCULAR | Status: DC | PRN
  Administered 2017-10-25: 50 ug via INTRAVENOUS
  Filled 2017-10-25: qty 2

## 2017-10-25 MED ORDER — ROPIVACAINE HCL 2 MG/ML IJ SOLN
18.0000 mL | Freq: Once | INTRAMUSCULAR | Status: AC
  Administered 2017-10-25: 18 mL via PERINEURAL
  Filled 2017-10-25: qty 20

## 2017-10-25 MED ORDER — LACTATED RINGERS IV SOLN
1000.0000 mL | Freq: Once | INTRAVENOUS | Status: AC
  Administered 2017-10-25: 1000 mL via INTRAVENOUS

## 2017-10-25 MED ORDER — ROPIVACAINE HCL 2 MG/ML IJ SOLN
4.0000 mL | Freq: Once | INTRAMUSCULAR | Status: AC
  Administered 2017-10-25: 10 mL via INTRA_ARTICULAR
  Filled 2017-10-25: qty 10

## 2017-10-25 MED ORDER — TRIAMCINOLONE ACETONIDE 40 MG/ML IJ SUSP
80.0000 mg | Freq: Once | INTRAMUSCULAR | Status: AC
  Administered 2017-10-25: 80 mg
  Filled 2017-10-25: qty 2

## 2017-10-25 MED ORDER — MIDAZOLAM HCL 5 MG/5ML IJ SOLN
1.0000 mg | INTRAMUSCULAR | Status: DC | PRN
  Administered 2017-10-25: 2 mg via INTRAVENOUS
  Filled 2017-10-25: qty 5

## 2017-10-25 NOTE — Progress Notes (Signed)
Safety precautions to be maintained throughout the outpatient stay will include: orient to surroundings, keep bed in low position, maintain call bell within reach at all times, provide assistance with transfer out of bed and ambulation.  

## 2017-10-25 NOTE — Progress Notes (Signed)
Patient's Name: Erica [name]  MRN: [id number]  Referring Provider: Hillery Aldo, MD  DOB: [birth year]  PCP: Hillery Aldo, MD  DOS: 10/25/2017  Note by: Oswaldo Done, MD  Service setting: Ambulatory outpatient  Specialty: Interventional Pain Management  Patient type: Established  Location: [facility] (AMB) Pain Management Facility  Visit type: Interventional Procedure   Primary Reason for Visit: Interventional Pain Management Treatment. CC: Back Pain (low)  Procedure #1: (Done in-room)  Anesthesia, Analgesia, Anxiolysis:  Type: Therapeutic Intra-Articular Hyalgan Knee Injection #3  Region: Lateral infrapatellar Knee Region Level: Knee Joint Laterality: Bilateral  Type: Local Anesthesia Indication(s): Analgesia         Local Anesthetic: Lidocaine 1-2% Route: Infiltration ([place]/IM) IV Access: Declined Sedation: Declined    Indications: 1. Osteoarthritis of knee (Bilateral) (L>R)   2. Chronic knee pain (Tertiary source of pain) (Bilateral) (L>R)    Procedure #2: (Done in fluoroscopy suite)  Anesthesia, Analgesia, Anxiolysis:  Type: Lumbar Facet, Medial Branch Block(s) #4  Primary Purpose: Diagnostic Region: Posterolateral Lumbosacral Spine Level: L2, L3, L4, L5, & S1 Medial Branch Level(s). Injecting these levels blocks the L3-4, L4-5, and L5-S1 lumbar facet joints. Laterality: Bilateral  Type: Moderate (Conscious) Sedation combined with Local Anesthesia Indication(s): Analgesia and Anxiety Route: Intravenous (IV) IV Access: Secured Sedation: Meaningful verbal contact was maintained at all times during the procedure  Local Anesthetic: Lidocaine 1-2%   Indications: 1. Spondylosis without myelopathy or radiculopathy, lumbosacral region   2. Lumbar facet syndrome (Bilateral) (R>L)   3. Lumbar spondylosis   4. Chronic low back pain (Primary Source of Pain) (Bilateral) (L>R)    Pain Score: Pre-procedure: 5 /10 Post-procedure: 1 /10  Pre-op Assessment:  Erica [name] is a 52 y.o. (year  old), female patient, seen today for interventional treatment. She  has a past surgical history that includes Knee surgery (Left) and Flexible bronchoscopy (N/A, 06/20/2015). Erica [name] has a current medication list which includes the following prescription(s): albuterol, amoxicillin-clavulanate, aspirin, atorvastatin, vitamin d3, diltiazem, duloxetine, duloxetine, fluconazole, fluticasone-salmeterol, folic acid, furosemide, losartan, magnesium oxide (antacid), metformin, oxycodone, oxycodone, oxygen-helium, pantoprazole, potassium chloride sa, pregabalin, sucralfate, tiotropium, tizanidine, diltiazem, and oxycodone, and the following Facility-Administered Medications: fentanyl and midazolam. Her primarily concern today is the Back Pain (low)  Initial Vital Signs:  Pulse Rate: 79 Temp: 98.2 F (36.8 C) Resp: 16 BP: (!) 145/77 SpO2: 99 %  BMI: Estimated body mass index is 30.41 kg/m as calculated from the following:   Height as of this encounter: 5\' 8"  (1.727 m).   Weight as of this encounter: 200 lb (90.7 kg).  Risk Assessment: Allergies: Reviewed. She is allergic to tramadol and penicillins.  Allergy Precautions: None required Coagulopathies: Reviewed. None identified.  Blood-thinner therapy: None at this time Active Infection(s): Reviewed. None identified. Erica [name] is afebrile  Site Confirmation: Erica [name] was asked to confirm the procedure and laterality before marking the site Procedure checklist: Completed Consent: Before the procedure and under the influence of no sedative(s), amnesic(s), or anxiolytics, the patient was informed of the treatment options, risks and possible complications. To fulfill our ethical and legal obligations, as recommended by the American Medical Association's Code of Ethics, I have informed the patient of my clinical impression; the nature and purpose of the treatment or procedure; the risks, benefits, and possible complications of the intervention; the  alternatives, including doing nothing; the risk(s) and benefit(s) of the alternative treatment(s) or procedure(s); and the risk(s) and benefit(s) of doing nothing. The patient was provided information about the general  risks and possible complications associated with the procedure. These may include, but are not limited to: failure to achieve desired goals, infection, bleeding, organ or nerve damage, allergic reactions, paralysis, and death. In addition, the patient was informed of those risks and complications associated to the procedure, such as failure to decrease pain; infection; bleeding; organ or nerve damage with subsequent damage to sensory, motor, and/or autonomic systems, resulting in permanent pain, numbness, and/or weakness of one or several areas of the body; allergic reactions; (i.e.: anaphylactic reaction); and/or death. Furthermore, the patient was informed of those risks and complications associated with the medications. These include, but are not limited to: allergic reactions (i.e.: anaphylactic or anaphylactoid reaction(s)); adrenal axis suppression; blood sugar elevation that in diabetics may result in ketoacidosis or comma; water retention that in patients with history of congestive heart failure may result in shortness of breath, pulmonary edema, and decompensation with resultant heart failure; weight gain; swelling or edema; medication-induced neural toxicity; particulate matter embolism and blood vessel occlusion with resultant organ, and/or nervous system infarction; and/or aseptic necrosis of one or more joints. Finally, the patient was informed that Medicine is not an exact science; therefore, there is also the possibility of unforeseen or unpredictable risks and/or possible complications that may result in a catastrophic outcome. The patient indicated having understood very clearly. We have given the patient no guarantees and we have made no promises. Enough time was given to the  patient to ask questions, all of which were answered to the patient's satisfaction. Erica [name] has indicated that she wanted to continue with the procedure. Attestation: I, the ordering provider, attest that I have discussed with the patient the benefits, risks, side-effects, alternatives, likelihood of achieving goals, and potential problems during recovery for the procedure that I have provided informed consent. Date  Time: 10/25/2017  1:17 PM  Pre-Procedure #1 Preparation:  Monitoring: As per clinic protocol. Respiration, ETCO2, SpO2, BP, heart rate and rhythm monitor placed and checked for adequate function Safety Precautions: Patient was assessed for positional comfort and pressure points before starting the procedure. Time-out: I initiated and conducted the "Time-out" before starting the procedure, as per protocol. The patient was asked to participate by confirming the accuracy of the "Time Out" information. Verification of the correct person, site, and procedure were performed and confirmed by me, the nursing staff, and the patient. "Time-out" conducted as per Joint Commission's Universal Protocol (UP.01.01.01). Time: 1445  Description of Procedure #1:  Position: Sitting Target Area: Knee Joint Approach: Just above the Lateral tibial plateau, lateral to the infrapatellar tendon. Area Prepped: Entire knee area, from the mid-thigh to the mid-shin. Prepping solution: ChloraPrep (2% chlorhexidine gluconate and 70% isopropyl alcohol) Safety Precautions: Aspiration looking for blood return was conducted prior to all injections. At no point did we inject any substances, as a needle was being advanced. No attempts were made at seeking any paresthesias. Safe injection practices and needle disposal techniques used. Medications properly checked for expiration dates. SDV (single dose vial) medications used. Description of the Procedure: Protocol guidelines were followed. The patient was placed in position  over the fluoroscopy table. The target area was identified and the area prepped in the usual manner. Skin desensitized using vapocoolant spray. Skin & deeper tissues infiltrated with local anesthetic. Appropriate amount of time allowed to pass for local anesthetics to take effect. The procedure needles were then advanced to the target area. Proper needle placement secured. Negative aspiration confirmed. Solution injected in intermittent fashion, asking for systemic symptoms  every 0.5cc of injectate. The needles were then removed and the area cleansed, making sure to leave some of the prepping solution back to take advantage of its long term bactericidal properties.  Start Time: 1445 hrs.  Materials:  Needle(s) Type: Regular needle Gauge: 25G Length: 1.5-in Medication(s): Please see orders for medications and dosing details.  Imaging Guidance for procedure #1:  Type of Imaging Technique: None used Indication(s): N/A Exposure Time: No patient exposure Contrast: None used. Fluoroscopic Guidance: N/A Ultrasound Guidance: N/A Interpretation: N/A  Description of Procedure #2:  Position: Prone Laterality: Bilateral. The procedure was performed in identical fashion on both sides. Levels:  L2, L3, L4, L5, & S1 Medial Branch Level(s) Area Prepped: Posterior Lumbosacral Region Prepping solution: ChloraPrep (2% chlorhexidine gluconate and 70% isopropyl alcohol) Safety Precautions: Aspiration looking for blood return was conducted prior to all injections. At no point did we inject any substances, as a needle was being advanced. Before injecting, the patient was told to immediately notify me if she was experiencing any new onset of "ringing in the ears, or metallic taste in the mouth". No attempts were made at seeking any paresthesias. Safe injection practices and needle disposal techniques used. Medications properly checked for expiration dates. SDV (single dose vial) medications used. After the  completion of the procedure, all disposable equipment used was discarded in the proper designated medical waste containers. Local Anesthesia: Protocol guidelines were followed. The patient was positioned over the fluoroscopy table. The area was prepped in the usual manner. The time-out was completed. The target area was identified using fluoroscopy. A 12-in long, straight, sterile hemostat was used with fluoroscopic guidance to locate the targets for each level blocked. Once located, the skin was marked with an approved surgical skin marker. Once all sites were marked, the skin (epidermis, dermis, and hypodermis), as well as deeper tissues (fat, connective tissue and muscle) were infiltrated with a small amount of a short-acting local anesthetic, loaded on a 10cc syringe with a 25G, 1.5-in  Needle. An appropriate amount of time was allowed for local anesthetics to take effect before proceeding to the next step. Local Anesthetic: Lidocaine 2.0% The unused portion of the local anesthetic was discarded in the proper designated containers. Technical explanation of process:  L2 Medial Branch Nerve Block (MBB): The target area for the L2 medial branch is at the junction of the postero-lateral aspect of the superior articular process and the superior, posterior, and medial edge of the transverse process of L3. Under fluoroscopic guidance, a Quincke needle was inserted until contact was made with os over the superior postero-lateral aspect of the pedicular shadow (target area). After negative aspiration for blood, 0.5 mL of the nerve block solution was injected without difficulty or complication. The needle was removed intact. L3 Medial Branch Nerve Block (MBB): The target area for the L3 medial branch is at the junction of the postero-lateral aspect of the superior articular process and the superior, posterior, and medial edge of the transverse process of L4. Under fluoroscopic guidance, a Quincke needle was inserted  until contact was made with os over the superior postero-lateral aspect of the pedicular shadow (target area). After negative aspiration for blood, 0.5 mL of the nerve block solution was injected without difficulty or complication. The needle was removed intact. L4 Medial Branch Nerve Block (MBB): The target area for the L4 medial branch is at the junction of the postero-lateral aspect of the superior articular process and the superior, posterior, and medial edge of the transverse  process of L5. Under fluoroscopic guidance, a Quincke needle was inserted until contact was made with os over the superior postero-lateral aspect of the pedicular shadow (target area). After negative aspiration for blood, 0.5 mL of the nerve block solution was injected without difficulty or complication. The needle was removed intact. L5 Medial Branch Nerve Block (MBB): The target area for the L5 medial branch is at the junction of the postero-lateral aspect of the superior articular process and the superior, posterior, and medial edge of the sacral ala. Under fluoroscopic guidance, a Quincke needle was inserted until contact was made with os over the superior postero-lateral aspect of the pedicular shadow (target area). After negative aspiration for blood, 0.5 mL of the nerve block solution was injected without difficulty or complication. The needle was removed intact. S1 Medial Branch Nerve Block (MBB): The target area for the S1 medial branch is at the posterior and inferior 6 o'clock position of the L5-S1 facet joint. Under fluoroscopic guidance, the Quincke needle inserted for the L5 MBB was redirected until contact was made with os over the inferior and postero aspect of the sacrum, at the 6 o' clock position under the L5-S1 facet joint (Target area). After negative aspiration for blood, 0.5 mL of the nerve block solution was injected without difficulty or complication. The needle was removed intact. Procedural Needles: 22-gauge,  3.5-inch, Quincke needles used for all levels. Nerve block solution: 0.2% PF-Ropivacaine + Triamcinolone (40 mg/mL) diluted to a final concentration of 4 mg of Triamcinolone/mL of Ropivacaine The unused portion of the solution was discarded in the proper designated containers.  Once the entire procedure was completed, the treated area was cleaned, making sure to leave some of the prepping solution back to take advantage of its long term bactericidal properties.   Illustration of the posterior view of the lumbar spine and the posterior neural structures. Laminae of L2 through S1 are labeled. DPRL5, dorsal primary ramus of L5; DPRS1, dorsal primary ramus of S1; DPR3, dorsal primary ramus of L3; FJ, facet (zygapophyseal) joint L3-L4; I, inferior articular process of L4; LB1, lateral branch of dorsal primary ramus of L1; IAB, inferior articular branches from L3 medial branch (supplies L4-L5 facet joint); IBP, intermediate branch plexus; MB3, medial branch of dorsal primary ramus of L3; NR3, third lumbar nerve root; S, superior articular process of L5; SAB, superior articular branches from L4 (supplies L4-5 facet joint also); TP3, transverse process of L3.  Vitals:   10/25/17 1500 10/25/17 1509 10/25/17 1519 10/25/17 1529  BP: (!) 142/73 (!) 144/70 131/67 133/68  Pulse:      Resp: 15 11 14 18   Temp:  (!) 97.5 F (36.4 C)    TempSrc:  Temporal    SpO2: 92% 92% 91% 92%  Weight:      Height:       End Time: 1500 hrs.  Imaging Guidance (Spinal) for procedure #2:  Type of Imaging Technique: Fluoroscopy Guidance (Spinal) Indication(s): Assistance in needle guidance and placement for procedures requiring needle placement in or near specific anatomical locations not easily accessible without such assistance. Exposure Time: Please see nurses notes. Contrast: None used. Fluoroscopic Guidance: I was personally present during the use of fluoroscopy. "Tunnel Vision Technique" used to obtain the best  possible view of the target area. Parallax error corrected before commencing the procedure. "Direction-depth-direction" technique used to introduce the needle under continuous pulsed fluoroscopy. Once target was reached, antero-posterior, oblique, and lateral fluoroscopic projection used confirm needle placement in all planes. Images  permanently stored in EMR. Interpretation: No contrast injected. I personally interpreted the imaging intraoperatively. Adequate needle placement confirmed in multiple planes. Permanent images saved into the patient's record.  Antibiotic Prophylaxis:   Anti-infectives (From admission, onward)   None     Indication(s): None identified  Post-operative Assessment:  Post-procedure Vital Signs:  Pulse Rate: 79 Temp: (!) 97.5 F (36.4 C) Resp: 18 BP: 133/68 SpO2: 92 %  EBL: None  Complications: No immediate post-treatment complications observed by team, or reported by patient.  Note: The patient tolerated the entire procedure well. A repeat set of vitals were taken after the procedure and the patient was kept under observation following institutional policy, for this type of procedure. Post-procedural neurological assessment was performed, showing return to baseline, prior to discharge. The patient was provided with post-procedure discharge instructions, including a section on how to identify potential problems. Should any problems arise concerning this procedure, the patient was given instructions to immediately contact us, at any time, without hesitation. In any case, we plan to contact the patient by telephone for a follow-up status report regarding this interventional procedure.  Comments:  No additional relevant information.  Plan of Care   Possible POC:  Return to clinic in 2 weeks for intra-articular Hyalgan knee injections #4   Imaging Orders     DG C-Arm 1-60 Min-No Report  Procedure Orders     KNEE INJECTION     KNEE INJECTION     LUMBAR  FACET(MEDIAL BRANCH NERVE BLOCK) MBNB  Medications ordered for procedure: Meds ordered this encounter  Medications  . lidocaine (PF) (XYLOCAINE) 1 % injection 4 mL  . ropivacaine (PF) 2 mg/mL (0.2%) (NAROPIN) injection 4 mL  . Sodium Hyaluronate SOSY 2 mL  . Sodium Hyaluronate SOSY 2 mL  . lidocaine (XYLOCAINE) 2 % (with pres) injection 400 mg  . midazolam (VERSED) 5 MG/5ML injection 1-2 mg    Make sure Flumazenil is available in the pyxis when using this medication. If oversedation occurs, administer 0.2 mg IV over 15 sec. If after 45 sec no response, administer 0.2 mg again over 1 min; may repeat at 1 min intervals; not to exceed 4 doses (1 mg)  . fentaNYL (SUBLIMAZE) injection 25-50 mcg    Make sure Narcan is available in the pyxis when using this medication. In the event of respiratory depression (RR< 8/min): Titrate NARCAN (naloxone) in increments of 0.1 to 0.2 mg IV at 2-3 minute intervals, until desired degree of reversal.  . lactated ringers infusion 1,000 mL  . ropivacaine (PF) 2 mg/mL (0.2%) (NAROPIN) injection 18 mL  . triamcinolone acetonide (KENALOG-40) injection 80 mg   Medications administered: We administered lidocaine (PF), ropivacaine (PF) 2 mg/mL (0.2%), Sodium Hyaluronate, Sodium Hyaluronate, lidocaine, midazolam, fentaNYL, lactated ringers, ropivacaine (PF) 2 mg/mL (0.2%), and triamcinolone acetonide.  See the medical record for exact dosing, route, and time of administration.  New Prescriptions   No medications on file   Disposition: Discharge home  Discharge Date & Time: 10/25/2017; 1533 hrs.   Physician-requested Follow-up: Return for PPE (2 wks) + Procedure (no sedation): (B) IA Hyalgan #4.  Future Appointments  Date Time Provider Department Center  10/27/2017  2:30 PM Toney Reil, MD [facility] None  11/08/2017  1:00 PM Delano Metz, MD [facility] None  11/21/2017  2:45 PM [facility] None  12/01/2017  9:45 AM Barbette Merino,  NP [facility] None  12/26/2017  1:40 PM Delma Freeze, FNP [facility] None   Primary Care  Physician: Hillery Aldo, MD Location: [facility] Note by: Oswaldo Done, MD Date: 10/25/2017; Time: 4:19 PM  Disclaimer:  Medicine is not an Visual merchandiser. The only guarantee in medicine is that nothing is guaranteed. It is important to note that the decision to proceed with this intervention was based on the information collected from the patient. The Data and conclusions were drawn from the patient's questionnaire, the interview, and the physical examination. Because the information was provided in large part by the patient, it cannot be guaranteed that it has not been purposely or unconsciously manipulated. Every effort has been made to obtain as much relevant data as possible for this evaluation. It is important to note that the conclusions that lead to this procedure are derived in large part from the available data. Always take into account that the treatment will also be dependent on availability of resources and existing treatment guidelines, considered by other Pain Management Practitioners as being common knowledge and practice, at the time of the intervention. For Medico-Legal purposes, it is also important to point out that variation in procedural techniques and pharmacological choices are the acceptable norm. The indications, contraindications, technique, and results of the above procedure should only be interpreted and judged by a Board-Certified Interventional Pain Specialist with extensive familiarity and expertise in the same exact procedure and technique.

## 2017-10-25 NOTE — Patient Instructions (Signed)
____________________________________________________________________________________________  Post-Procedure Discharge Instructions  Instructions:  Apply ice: Fill a plastic sandwich bag with crushed ice. Cover it with a small towel and apply to injection site. Apply for 15 minutes then remove x 15 minutes. Repeat sequence on day of procedure, until you go to bed. The purpose is to minimize swelling and discomfort after procedure.  Apply heat: Apply heat to procedure site starting the day following the procedure. The purpose is to treat any soreness and discomfort from the procedure.  Food intake: Start with clear liquids (like water) and advance to regular food, as tolerated.   Physical activities: Keep activities to a minimum for the first 8 hours after the procedure.   Driving: If you have received any sedation, you are not allowed to drive for 24 hours after your procedure.  Blood thinner: Restart your blood thinner 6 hours after your procedure. (Only for those taking blood thinners)  Insulin: As soon as you can eat, you may resume your normal dosing schedule. (Only for those taking insulin)  Infection prevention: Keep procedure site clean and dry.  Post-procedure Pain Diary: Extremely important that this be done correctly and accurately. Recorded information will be used to determine the next step in treatment.  Pain evaluated is that of treated area only. Do not include pain from an untreated area.  Complete every hour, on the hour, for the initial 8 hours. Set an alarm to help you do this part accurately.  Do not go to sleep and have it completed later. It will not be accurate.  Follow-up appointment: Keep your follow-up appointment after the procedure. Usually 2 weeks for most procedures. (6 weeks in the case of radiofrequency.) Bring you pain diary.   Expect:  From numbing medicine (AKA: Local Anesthetics): Numbness or decrease in pain.  Onset: Full effect within 15  minutes of injected.  Duration: It will depend on the type of local anesthetic used. On the average, 1 to 8 hours.   From steroids: Decrease in swelling or inflammation. Once inflammation is improved, relief of the pain will follow.  Onset of benefits: Depends on the amount of swelling present. The more swelling, the longer it will take for the benefits to be seen. In some cases, up to 10 days.  Duration: Steroids will stay in the system x 2 weeks. Duration of benefits will depend on multiple posibilities including persistent irritating factors.  From procedure: Some discomfort is to be expected once the numbing medicine wears off. This should be minimal if ice and heat are applied as instructed.  Call if:  You experience numbness and weakness that gets worse with time, as opposed to wearing off.  New onset bowel or bladder incontinence. (This applies to Spinal procedures only)  Emergency Numbers:  Durning business hours (Monday - Thursday, 8:00 AM - 4:00 PM) (Friday, 9:00 AM - 12:00 Noon): [phone]  After hours: [phone] ____________________________________________________________________________________________   ____________________________________________________________________________________________  Preparing for your procedure (without sedation)  Instructions: . Oral Intake: Do not eat or drink anything for at least 3 hours prior to your procedure. . Transportation: Unless otherwise stated by your physician, you may drive yourself after the procedure. . Blood Pressure Medicine: Take your blood pressure medicine with a sip of water the morning of the procedure. . Blood thinners:  . Diabetics on insulin: Notify the staff so that you can be scheduled 1st case in the morning. If your diabetes requires high dose insulin, take only  of your normal insulin dose   the morning of the procedure and notify the staff that you have done so. . Preventing infections: Shower  with an antibacterial soap the morning of your procedure.  . Build-up your immune system: Take 1000 mg of Vitamin C with every meal (3 times a day) the day prior to your procedure. . Antibiotics: Inform the staff if you have a condition or reason that requires you to take antibiotics before dental procedures. . Pregnancy: If you are pregnant, call and cancel the procedure. . Sickness: If you have a cold, fever, or any active infections, call and cancel the procedure. . Arrival: You must be in the facility at least 30 minutes prior to your scheduled procedure. . Children: Do not bring any children with you. . Dress appropriately: Bring dark clothing that you would not mind if they get stained. . Valuables: Do not bring any jewelry or valuables.  Procedure appointments are reserved for interventional treatments only. . No Prescription Refills. . No medication changes will be discussed during procedure appointments. . No disability issues will be discussed.  Remember:  Regular Business hours are:  Monday to Thursday 8:00 AM to 4:00 PM  Provider's Schedule: Marlo Goodrich, MD:  Procedure days: Tuesday and Thursday 7:30 AM to 4:00 PM  Bilal Lateef, MD:  Procedure days: Monday and Wednesday 7:30 AM to 4:00 PM ____________________________________________________________________________________________    

## 2017-10-26 ENCOUNTER — Telehealth: Payer: Self-pay

## 2017-10-26 NOTE — Telephone Encounter (Signed)
No answer- Left message to call if needed 

## 2017-10-27 ENCOUNTER — Other Ambulatory Visit: Payer: Self-pay

## 2017-10-27 ENCOUNTER — Ambulatory Visit: Payer: Medicaid Other | Admitting: Gastroenterology

## 2017-11-08 ENCOUNTER — Ambulatory Visit
Admission: RE | Admit: 2017-11-08 | Discharge: 2017-11-08 | Disposition: A | Discharge status: Home / Self Care | Payer: Medicaid Other | Source: Ambulatory Visit | Attending: Pain Medicine | Admitting: Pain Medicine

## 2017-11-08 ENCOUNTER — Other Ambulatory Visit: Payer: Self-pay

## 2017-11-08 ENCOUNTER — Ambulatory Visit: Payer: Medicaid Other | Attending: Pain Medicine | Admitting: Pain Medicine

## 2017-11-08 VITALS — BP 132/75 | HR 90 | Temp 98.7°F | Resp 16 | Ht 68.0 in | Wt 200.0 lb

## 2017-11-08 DIAGNOSIS — Z888 Allergy status to other drugs, medicaments and biological substances status: Secondary | ICD-10-CM | POA: Insufficient documentation

## 2017-11-08 DIAGNOSIS — Z7984 Long term (current) use of oral hypoglycemic drugs: Secondary | ICD-10-CM | POA: Diagnosis not present

## 2017-11-08 DIAGNOSIS — Z7982 Long term (current) use of aspirin: Secondary | ICD-10-CM | POA: Insufficient documentation

## 2017-11-08 DIAGNOSIS — R0781 Pleurodynia: Secondary | ICD-10-CM

## 2017-11-08 DIAGNOSIS — Z79899 Other long term (current) drug therapy: Secondary | ICD-10-CM | POA: Insufficient documentation

## 2017-11-08 DIAGNOSIS — M8580 Other specified disorders of bone density and structure, unspecified site: Secondary | ICD-10-CM | POA: Insufficient documentation

## 2017-11-08 DIAGNOSIS — Z880 Allergy status to penicillin: Secondary | ICD-10-CM | POA: Diagnosis not present

## 2017-11-08 DIAGNOSIS — S2231XA Fracture of one rib, right side, initial encounter for closed fracture: Secondary | ICD-10-CM | POA: Insufficient documentation

## 2017-11-08 DIAGNOSIS — Z79891 Long term (current) use of opiate analgesic: Secondary | ICD-10-CM | POA: Insufficient documentation

## 2017-11-08 DIAGNOSIS — Z7951 Long term (current) use of inhaled steroids: Secondary | ICD-10-CM | POA: Insufficient documentation

## 2017-11-08 DIAGNOSIS — M25521 Pain in right elbow: Secondary | ICD-10-CM | POA: Insufficient documentation

## 2017-11-08 DIAGNOSIS — M170 Bilateral primary osteoarthritis of knee: Secondary | ICD-10-CM | POA: Diagnosis not present

## 2017-11-08 DIAGNOSIS — M25561 Pain in right knee: Secondary | ICD-10-CM | POA: Diagnosis present

## 2017-11-08 DIAGNOSIS — M25562 Pain in left knee: Secondary | ICD-10-CM | POA: Diagnosis not present

## 2017-11-08 DIAGNOSIS — X58XXXA Exposure to other specified factors, initial encounter: Secondary | ICD-10-CM | POA: Insufficient documentation

## 2017-11-08 DIAGNOSIS — G8929 Other chronic pain: Secondary | ICD-10-CM | POA: Insufficient documentation

## 2017-11-08 DIAGNOSIS — M1612 Unilateral primary osteoarthritis, left hip: Secondary | ICD-10-CM | POA: Insufficient documentation

## 2017-11-08 MED ORDER — ROPIVACAINE HCL 2 MG/ML IJ SOLN
4.0000 mL | Freq: Once | INTRAMUSCULAR | Status: AC
  Administered 2017-11-08: 4 mL via INTRA_ARTICULAR
  Filled 2017-11-08: qty 10

## 2017-11-08 MED ORDER — SODIUM HYALURONATE (VISCOSUP) 20 MG/2ML IX SOSY
2.0000 mL | PREFILLED_SYRINGE | Freq: Once | INTRA_ARTICULAR | Status: AC
  Administered 2017-11-08: 2 mL via INTRA_ARTICULAR

## 2017-11-08 MED ORDER — LIDOCAINE HCL (PF) 1 % IJ SOLN
4.0000 mL | Freq: Once | INTRAMUSCULAR | Status: AC
  Administered 2017-11-08: 4 mL
  Filled 2017-11-08: qty 5

## 2017-11-08 NOTE — Patient Instructions (Addendum)
____________________________________________________________________________________________  Post-Procedure Discharge Instructions  Instructions:  Apply ice: Fill a plastic sandwich bag with crushed ice. Cover it with a small towel and apply to injection site. Apply for 15 minutes then remove x 15 minutes. Repeat sequence on day of procedure, until you go to bed. The purpose is to minimize swelling and discomfort after procedure.  Apply heat: Apply heat to procedure site starting the day following the procedure. The purpose is to treat any soreness and discomfort from the procedure.  Food intake: Start with clear liquids (like water) and advance to regular food, as tolerated.   Physical activities: Keep activities to a minimum for the first 8 hours after the procedure.   Driving: If you have received any sedation, you are not allowed to drive for 24 hours after your procedure.  Blood thinner: Restart your blood thinner 6 hours after your procedure. (Only for those taking blood thinners)  Insulin: As soon as you can eat, you may resume your normal dosing schedule. (Only for those taking insulin)  Infection prevention: Keep procedure site clean and dry.  Post-procedure Pain Diary: Extremely important that this be done correctly and accurately. Recorded information will be used to determine the next step in treatment.  Pain evaluated is that of treated area only. Do not include pain from an untreated area.  Complete every hour, on the hour, for the initial 8 hours. Set an alarm to help you do this part accurately.  Do not go to sleep and have it completed later. It will not be accurate.  Follow-up appointment: Keep your follow-up appointment after the procedure. Usually 2 weeks for most procedures. (6 weeks in the case of radiofrequency.) Bring you pain diary.   Expect:  From numbing medicine (AKA: Local Anesthetics): Numbness or decrease in pain.  Onset: Full effect within 15  minutes of injected.  Duration: It will depend on the type of local anesthetic used. On the average, 1 to 8 hours.   From steroids: Decrease in swelling or inflammation. Once inflammation is improved, relief of the pain will follow.  Onset of benefits: Depends on the amount of swelling present. The more swelling, the longer it will take for the benefits to be seen. In some cases, up to 10 days.  Duration: Steroids will stay in the system x 2 weeks. Duration of benefits will depend on multiple posibilities including persistent irritating factors.  From procedure: Some discomfort is to be expected once the numbing medicine wears off. This should be minimal if ice and heat are applied as instructed.  Call if:  You experience numbness and weakness that gets worse with time, as opposed to wearing off.  New onset bowel or bladder incontinence. (This applies to Spinal procedures only)  Emergency Numbers:  Durning business hours (Monday - Thursday, 8:00 AM - 4:00 PM) (Friday, 9:00 AM - 12:00 Noon): [phone]  After hours: [phone] ____________________________________________________________________________________________    Knee Injection A knee injection is a procedure to get medicine into your knee joint. Your health care provider puts a needle into the joint and injects medicine with an attached syringe. The injected medicine may relieve the pain, swelling, and stiffness of arthritis. The injected medicine may also help to lubricate and cushion your knee joint. You may need more than one injection. Tell a health care provider about:  Any allergies you have.  All medicines you are taking, including vitamins, herbs, eye drops, creams, and over-the-counter medicines.  Any problems you or family members have   had with anesthetic medicines.  Any blood disorders you have.  Any surgeries you have had.  Any medical conditions you have. What are the risks? Generally, this is a  safe procedure. However, problems may occur, including:  Infection.  Bleeding.  Worsening symptoms.  Damage to the area around your knee.  Allergic reaction to any of the medicines.  Skin reactions from repeated injections.  What happens before the procedure?  Ask your health care provider about changing or stopping your regular medicines. This is especially important if you are taking diabetes medicines or blood thinners.  Plan to have someone take you home after the procedure. What happens during the procedure?  You will sit or lie down in a position for your knee to be treated.  The skin over your kneecap will be cleaned with a germ-killing solution (antiseptic).  You will be given a medicine that numbs the area (local anesthetic). You may feel some stinging.  After your knee becomes numb, you will have a second injection. This is the medicine. This needle is carefully placed between your kneecap and your knee. The medicine is injected into the joint space.  At the end of the procedure, the needle will be removed.  A bandage (dressing) may be placed over the injection site. The procedure may vary among health care providers and hospitals. What happens after the procedure?  You may have to move your knee through its full range of motion. This helps to get all of the medicine into your joint space.  Your blood pressure, heart rate, breathing rate, and blood oxygen level will be monitored often until the medicines you were given have worn off.  You will be watched to make sure that you do not have a reaction to the injected medicine. This information is not intended to replace advice given to you by your health care provider. Make sure you discuss any questions you have with your health care provider. Document Released: 09/26/2006 Document Revised: 12/05/2015 Document Reviewed: 05/15/2014 [employer] Interactive Patient Education  2018 [employer]  

## 2017-11-08 NOTE — Progress Notes (Signed)
Patient's Name: Erica [name]  MRN: [id number]  Referring Provider: Hillery Aldo, MD  DOB: [birth year]  PCP: Erica Aldo, MD  DOS: 11/08/2017  Note by: Erica Done, MD  Service setting: Ambulatory outpatient  Specialty: Interventional Pain Management  Patient type: Established  Location: [facility] (AMB) Pain Management Facility  Visit type: Interventional Procedure   Primary Reason for Visit: Interventional Pain Management Treatment. CC: Knee Pain (bilateral) and Shoulder Pain (right elbow, pain 4)  Procedure:  Anesthesia, Analgesia, Anxiolysis:  Type: Therapeutic Intra-Articular Hyalgan Knee Injection #4  Region: Lateral infrapatellar Knee Region Level: Knee Joint Laterality: Bilateral  Type: Local Anesthesia Indication(s): Analgesia         Local Anesthetic: Lidocaine 1-2% Route: Infiltration ([place]/IM) IV Access: Declined Sedation: Declined    Indications: 1. Osteoarthritis of knee (Bilateral) (L>R)   2. Chronic knee pain [facility] source of pain) (Bilateral) (L>R)    Pain Score:  Pre-procedure: 2 /10 Post-procedure: 0-No pain/10  The patient really indicates that recently she had a fall and stroke her right rib cage.  She indicates having a lot of pain in the area with breathing.  The area was inspected and there is no evidence of bruising or point tenderness.  However, because of the patient's osteopenia, will order some x-rays of the right rib cage looking for any possible fractures.  In addition, the patient indicates that she has continued to have problems with her left hip "popping out of place".  She refers that her orthopedic surgeon recently retired and she was wondering if we could refer her to someone that could evaluate her case.  Post-Procedure Assessment  10/25/2017 Procedure: Diagnostic bilateral lumbar facet block #4 under fluoroscopic guidance and IV sedation + bilateral intra-articular Hyalgan injection #3 Pre-procedure pain score:  5/10 Post-procedure pain score:  1/10 (> 50% relief) Influential Factors: BMI: 30.41 kg/m Intra-procedural challenges: None observed.         Assessment challenges: None detected.              Reported side-effects: None.        Post-procedural adverse reactions or complications: None reported         Sedation: Sedation provided. When no sedatives are used, the analgesic levels obtained are directly associated to the effectiveness of the local anesthetics. However, when sedation is provided, the level of analgesia obtained during the initial 1 hour following the intervention, is believed to be the result of a combination of factors. These factors may include, but are not limited to: 1. The effectiveness of the local anesthetics used. 2. The effects of the analgesic(s) and/or anxiolytic(s) used. 3. The degree of discomfort experienced by the patient at the time of the procedure. 4. The patients ability and reliability in recalling and recording the events. 5. The presence and influence of possible secondary gains and/or psychosocial factors. Reported result: Relief experienced during the 1st hour after the procedure: 100 %(back ) (Ultra-Short Term Relief)            Interpretative annotation: Clinically appropriate result. Analgesia during this period is likely to be Local Anesthetic and/or IV Sedative (Analgesic/Anxiolytic) related.          Effects of local anesthetic: The analgesic effects attained during this period are directly associated to the localized infiltration of local anesthetics and therefore cary significant diagnostic value as to the etiological location, or anatomical origin, of the pain. Expected duration of relief is directly dependent on the pharmacodynamics of the local anesthetic used. Long-acting (4-6  hours) anesthetics used.  Reported result: Relief during the next 4 to 6 hour after the procedure: 100 % (Short-Term Relief)            Interpretative annotation: Clinically appropriate result. Analgesia  during this period is likely to be Local Anesthetic-related.          Long-term benefit: Defined as the period of time past the expected duration of local anesthetics (1 hour for short-acting and 4-6 hours for long-acting). With the possible exception of prolonged sympathetic blockade from the local anesthetics, benefits during this period are typically attributed to, or associated with, other factors such as analgesic sensory neuropraxia, antiinflammatory effects, or beneficial biochemical changes provided by agents other than the local anesthetics.  Reported result: Extended relief following procedure: 90 % (Long-Term Relief)            Interpretative annotation: Clinically appropriate result. Good relief. No permanent benefit expected. Inflammation plays a part in the etiology to the pain.          Current benefits: Defined as reported results that persistent at this point in time.   Analgesia: 75-100 % Erica [name] reports that both, extremity and the axial pain improved with the treatment. Function: Erica [name] reports improvement in function ROM: Erica [name] reports improvement in ROM Interpretative annotation: Ongoing benefit. Therapeutic benefit observed. Effective therapeutic approach.          Interpretation: Results would suggest a successful diagnostic intervention.                  Plan:  Proceed with treatment No.: 4 (of the intra-articular Hyalgan knee injections)           Pre-op Assessment:  Erica [name] is a 52 y.o. (year old), female patient, seen today for interventional treatment. She  has a past surgical history that includes Knee surgery (Left) and Flexible bronchoscopy (N/A, 06/20/2015). Erica [name] has a current medication list which includes the following prescription(s): albuterol, amoxicillin-clavulanate, aspirin, atorvastatin, accu-chek aviva plus, vitamin d3, diltiazem, diltiazem, duloxetine, duloxetine, fluconazole, fluticasone-salmeterol, folic acid, furosemide, losartan,  magnesium oxide (antacid), metformin, oxycodone, oxycodone, oxygen-helium, pantoprazole, potassium chloride sa, pregabalin, sucralfate, tiotropium, oxycodone, and tizanidine. Her primarily concern today is the Knee Pain (bilateral) and Shoulder Pain (right elbow, pain 4)  Initial Vital Signs:  Pulse/HCG Rate: 92  Temp: 98.7 F (37.1 C) Resp: 16 BP: 140/74 SpO2: 100 %(2 l [place])  BMI: Estimated body mass index is 30.41 kg/m as calculated from the following:   Height as of this encounter: 5\' 8"  (1.727 m).   Weight as of this encounter: 200 lb (90.7 kg).  Risk Assessment: Allergies: Reviewed. She is allergic to tramadol and penicillins.  Allergy Precautions: None required Coagulopathies: Reviewed. None identified.  Blood-thinner therapy: None at this time Active Infection(s): Reviewed. None identified. Ms. Jahr is afebrile  Site Confirmation: Ms. Heidel was asked to confirm the procedure and laterality before marking the site Procedure checklist: Completed Consent: Before the procedure and under the influence of no sedative(s), amnesic(s), or anxiolytics, the patient was informed of the treatment options, risks and possible complications. To fulfill our ethical and legal obligations, as recommended by the American Medical Association's Code of Ethics, I have informed the patient of my clinical impression; the nature and purpose of the treatment or procedure; the risks, benefits, and possible complications of the intervention; the alternatives, including doing nothing; the risk(s) and benefit(s) of the alternative treatment(s) or procedure(s); and the risk(s) and benefit(s) of doing nothing. The  patient was provided information about the general risks and possible complications associated with the procedure. These may include, but are not limited to: failure to achieve desired goals, infection, bleeding, organ or nerve damage, allergic reactions, paralysis, and death. In addition, the patient  was informed of those risks and complications associated to the procedure, such as failure to decrease pain; infection; bleeding; organ or nerve damage with subsequent damage to sensory, motor, and/or autonomic systems, resulting in permanent pain, numbness, and/or weakness of one or several areas of the body; allergic reactions; (i.e.: anaphylactic reaction); and/or death. Furthermore, the patient was informed of those risks and complications associated with the medications. These include, but are not limited to: allergic reactions (i.e.: anaphylactic or anaphylactoid reaction(s)); adrenal axis suppression; blood sugar elevation that in diabetics may result in ketoacidosis or comma; water retention that in patients with history of congestive heart failure may result in shortness of breath, pulmonary edema, and decompensation with resultant heart failure; weight gain; swelling or edema; medication-induced neural toxicity; particulate matter embolism and blood vessel occlusion with resultant organ, and/or nervous system infarction; and/or aseptic necrosis of one or more joints. Finally, the patient was informed that Medicine is not an exact science; therefore, there is also the possibility of unforeseen or unpredictable risks and/or possible complications that may result in a catastrophic outcome. The patient indicated having understood very clearly. We have given the patient no guarantees and we have made no promises. Enough time was given to the patient to ask questions, all of which were answered to the patient's satisfaction. [name] has indicated that she wanted to continue with the procedure. Attestation: I, the ordering provider, attest that I have discussed with the patient the benefits, risks, side-effects, alternatives, likelihood of achieving goals, and potential problems during recovery for the procedure that I have provided informed consent. Date  Time: 11/08/2017  1:19 PM  Pre-Procedure  Preparation:  Monitoring: As per clinic protocol. Respiration, ETCO2, SpO2, BP, heart rate and rhythm monitor placed and checked for adequate function Safety Precautions: Patient was assessed for positional comfort and pressure points before starting the procedure. Time-out: I initiated and conducted the "Time-out" before starting the procedure, as per protocol. The patient was asked to participate by confirming the accuracy of the "Time Out" information. Verification of the correct person, site, and procedure were performed and confirmed by me, the nursing staff, and the patient. "Time-out" conducted as per Joint Commission's Universal Protocol (UP.01.01.01). Time: 1345  Description of Procedure:       Position: Sitting Target Area: Knee Joint Approach: Just above the Lateral tibial plateau, lateral to the infrapatellar tendon. Area Prepped: Entire knee area, from the mid-thigh to the mid-shin. Prepping solution: ChloraPrep (2% chlorhexidine gluconate and 70% isopropyl alcohol) Safety Precautions: Aspiration looking for blood return was conducted prior to all injections. At no point did we inject any substances, as a needle was being advanced. No attempts were made at seeking any paresthesias. Safe injection practices and needle disposal techniques used. Medications properly checked for expiration dates. SDV (single dose vial) medications used. Description of the Procedure: Protocol guidelines were followed. The patient was placed in position over the fluoroscopy table. The target area was identified and the area prepped in the usual manner. Skin desensitized using vapocoolant spray. Skin & deeper tissues infiltrated with local anesthetic. Appropriate amount of time allowed to pass for local anesthetics to take effect. The procedure needles were then advanced to the target area. Proper needle placement secured. Negative aspiration  confirmed. Solution injected in intermittent fashion, asking for  systemic symptoms every 0.5cc of injectate. The needles were then removed and the area cleansed, making sure to leave some of the prepping solution back to take advantage of its long term bactericidal properties. Vitals:   11/08/17 1318 11/08/17 1350  BP: 140/74 132/75  Pulse: 92 90  Resp: 16 16  Temp: 98.7 F (37.1 C)   SpO2: 100% 100%  Weight: 200 lb (90.7 kg)   Height: 5\' 8"  (1.727 m)     Start Time: 1345 hrs. End Time: 1349 hrs. Materials:  Needle(s) Type: Regular needle Gauge: 25G Length: 1.5-in Medication(s): Please see orders for medications and dosing details.  Imaging Guidance:  Type of Imaging Technique: None used Indication(s): N/A Exposure Time: No patient exposure Contrast: None used. Fluoroscopic Guidance: N/A Ultrasound Guidance: N/A Interpretation: N/A  Antibiotic Prophylaxis:   Anti-infectives (From admission, onward)   None     Indication(s): None identified  Post-operative Assessment:  Post-procedure Vital Signs:  Pulse/HCG Rate: 90  Temp: 98.7 F (37.1 C) Resp: 16 BP: 132/75 SpO2: 100 %(2 l)  EBL: None  Complications: No immediate post-treatment complications observed by team, or reported by patient.  Note: The patient tolerated the entire procedure well. A repeat set of vitals were taken after the procedure and the patient was kept under observation following institutional policy, for this type of procedure. Post-procedural neurological assessment was performed, showing return to baseline, prior to discharge. The patient was provided with post-procedure discharge instructions, including a section on how to identify potential problems. Should any problems arise concerning this procedure, the patient was given instructions to immediately contact us, at any time, without hesitation. In any case, we plan to contact the patient by telephone for a follow-up status report regarding this interventional procedure.  Comments:  No additional relevant  information.  Plan of Care    Imaging Orders     DG Ribs Unilateral Right  Procedure Orders     KNEE INJECTION     KNEE INJECTION  Medications ordered for procedure: Meds ordered this encounter  Medications  . lidocaine (PF) (XYLOCAINE) 1 % injection 4 mL  . ropivacaine (PF) 2 mg/mL (0.2%) (NAROPIN) injection 4 mL  . Sodium Hyaluronate SOSY 2 mL  . Sodium Hyaluronate SOSY 2 mL   Medications administered: We administered lidocaine (PF), ropivacaine (PF) 2 mg/mL (0.2%), Sodium Hyaluronate, and Sodium Hyaluronate.  See the medical record for exact dosing, route, and time of administration.  New Prescriptions   No medications on file   Disposition: Discharge home  Discharge Date & Time: 11/08/2017; 1348 hrs.   Physician-requested Follow-up: Return for Procedure (no sedation): (B) Hyalgan #5.  Future Appointments  Date Time Provider Department Center  11/21/2017  2:45 PM [facility] None  12/01/2017  9:45 AM Barbette Merino, NP [facility] None  12/16/2017 10:30 AM Toney Reil, MD [facility] None  12/26/2017  1:40 PM Delma Freeze, FNP [facility] None   Primary Care Physician: Erica Aldo, MD Location: [facility] Note by: Erica Done, MD Date: 11/08/2017; Time: 1:57 PM  Disclaimer:  Medicine is not an Visual merchandiser. The only guarantee in medicine is that nothing is guaranteed. It is important to note that the decision to proceed with this intervention was based on the information collected from the patient. The Data and conclusions were drawn from the patient's questionnaire, the interview, and the physical examination. Because the information was provided in large  part by the patient, it cannot be guaranteed that it has not been purposely or unconsciously manipulated. Every effort has been made to obtain as much relevant data as possible for this evaluation. It is important to note that the conclusions that lead to  this procedure are derived in large part from the available data. Always take into account that the treatment will also be dependent on availability of resources and existing treatment guidelines, considered by other Pain Management Practitioners as being common knowledge and practice, at the time of the intervention. For Medico-Legal purposes, it is also important to point out that variation in procedural techniques and pharmacological choices are the acceptable norm. The indications, contraindications, technique, and results of the above procedure should only be interpreted and judged by a Board-Certified Interventional Pain Specialist with extensive familiarity and expertise in the same exact procedure and technique.

## 2017-11-09 ENCOUNTER — Telehealth: Payer: Self-pay

## 2017-11-09 NOTE — Telephone Encounter (Signed)
Post procedure phone call. Patient states she is doing good.  

## 2017-11-21 ENCOUNTER — Encounter: Payer: Self-pay | Admitting: Urology

## 2017-11-21 ENCOUNTER — Ambulatory Visit: Payer: Self-pay | Admitting: Urology

## 2017-11-24 ENCOUNTER — Ambulatory Visit: Payer: Medicaid Other | Admitting: Pain Medicine

## 2017-11-24 NOTE — Progress Notes (Deleted)
Patient's Name: Erica [name]  MRN: [id number]  Referring Provider: Hillery Aldo, MD  DOB: January 20, 1966  PCP: Hillery Aldo, MD  DOS: 11/24/2017  Note by: Oswaldo Done, MD  Service setting: Ambulatory outpatient  Specialty: Interventional Pain Management  Patient type: Established  Location: [facility] (AMB) Pain Management Facility  Visit type: Interventional Procedure   Primary Reason for Visit: Interventional Pain Management Treatment. CC: No chief complaint on file.  Procedure:  Anesthesia, Analgesia, Anxiolysis:  Type: Therapeutic Intra-Articular Hyalgan Knee Injection #5  Region: Lateral infrapatellar Knee Region Level: Knee Joint Laterality: Bilateral  Type: Local Anesthesia Indication(s): Analgesia         Local Anesthetic: Lidocaine 1-2% Route: Infiltration ([place]/IM) IV Access: Declined Sedation: Declined    Indications: 1. Osteoarthritis of knee (Bilateral) (L>R)   2. Chronic knee pain [facility] source of pain) (Bilateral) (L>R)    Pain Score: Pre-procedure:  /10 Post-procedure:  /10  Pre-op Assessment:  Erica [name] is a 52 y.o. (year old), female patient, seen today for interventional treatment. She  has a past surgical history that includes Knee surgery (Left) and Flexible bronchoscopy (N/A, 06/20/2015). Erica [name] has a current medication list which includes the following prescription(s): albuterol, amoxicillin-clavulanate, aspirin, atorvastatin, accu-chek aviva plus, vitamin d3, diltiazem, diltiazem, duloxetine, duloxetine, fluconazole, fluticasone-salmeterol, folic acid, furosemide, losartan, magnesium oxide (antacid), metformin, oxycodone, oxycodone, oxycodone, oxygen-helium, pantoprazole, potassium chloride sa, pregabalin, sucralfate, tiotropium, and tizanidine. Her primarily concern today is the No chief complaint on file.  Initial Vital Signs:  Pulse/HCG Rate:    Temp:   Resp:   BP:   SpO2:    BMI: Estimated body mass index is 30.41 kg/m as calculated from the  following:   Height as of 11/08/17:  (1.727 m).   Weight as of 11/08/17: 200 lb (90.7 kg).  Risk Assessment: Allergies: Reviewed. She is allergic to tramadol and penicillins.  Allergy Precautions: None required Coagulopathies: Reviewed. None identified.  Blood-thinner therapy: None at this time Active Infection(s): Reviewed. None identified. Erica [name] is afebrile  Site Confirmation: Erica [name] was asked to confirm the procedure and laterality before marking the site Procedure checklist: Completed Consent: Before the procedure and under the influence of no sedative(s), amnesic(s), or anxiolytics, the patient was informed of the treatment options, risks and possible complications. To fulfill our ethical and legal obligations, as recommended by the American Medical Association's Code of Ethics, I have informed the patient of my clinical impression; the nature and purpose of the treatment or procedure; the risks, benefits, and possible complications of the intervention; the alternatives, including doing nothing; the risk(s) and benefit(s) of the alternative treatment(s) or procedure(s); and the risk(s) and benefit(s) of doing nothing. The patient was provided information about the general risks and possible complications associated with the procedure. These may include, but are not limited to: failure to achieve desired goals, infection, bleeding, organ or nerve damage, allergic reactions, paralysis, and death. In addition, the patient was informed of those risks and complications associated to the procedure, such as failure to decrease pain; infection; bleeding; organ or nerve damage with subsequent damage to sensory, motor, and/or autonomic systems, resulting in permanent pain, numbness, and/or weakness of one or several areas of the body; allergic reactions; (i.e.: anaphylactic reaction); and/or death. Furthermore, the patient was informed of those risks and complications associated with the  medications. These include, but are not limited to: allergic reactions (i.e.: anaphylactic or anaphylactoid reaction(s)); adrenal axis suppression; blood sugar elevation that in diabetics may result in ketoacidosis  or comma; water retention that in patients with history of congestive heart failure may result in shortness of breath, pulmonary edema, and decompensation with resultant heart failure; weight gain; swelling or edema; medication-induced neural toxicity; particulate matter embolism and blood vessel occlusion with resultant organ, and/or nervous system infarction; and/or aseptic necrosis of one or more joints. Finally, the patient was informed that Medicine is not an exact science; therefore, there is also the possibility of unforeseen or unpredictable risks and/or possible complications that may result in a catastrophic outcome. The patient indicated having understood very clearly. We have given the patient no guarantees and we have made no promises. Enough time was given to the patient to ask questions, all of which were answered to the patient's satisfaction. Erica [name] has indicated that she wanted to continue with the procedure. Attestation: I, the ordering provider, attest that I have discussed with the patient the benefits, risks, side-effects, alternatives, likelihood of achieving goals, and potential problems during recovery for the procedure that I have provided informed consent. Date  Time: {CHL [facility]-PAIN TIME CHOICES:21018001}  Pre-Procedure Preparation:  Monitoring: As per clinic protocol. Respiration, ETCO2, SpO2, BP, heart rate and rhythm monitor placed and checked for adequate function Safety Precautions: Patient was assessed for positional comfort and pressure points before starting the procedure. Time-out: I initiated and conducted the "Time-out" before starting the procedure, as per protocol. The patient was asked to participate by confirming the accuracy of the "Time Out"  information. Verification of the correct person, site, and procedure were performed and confirmed by me, the nursing staff, and the patient. "Time-out" conducted as per Joint Commission's Universal Protocol (UP.01.01.01). Time:    Description of Procedure:       Position: Sitting Target Area: Knee Joint Approach: Just above the Lateral tibial plateau, lateral to the infrapatellar tendon. Area Prepped: Entire knee area, from the mid-thigh to the mid-shin. Prepping solution: ChloraPrep (2% chlorhexidine gluconate and 70% isopropyl alcohol) Safety Precautions: Aspiration looking for blood return was conducted prior to all injections. At no point did we inject any substances, as a needle was being advanced. No attempts were made at seeking any paresthesias. Safe injection practices and needle disposal techniques used. Medications properly checked for expiration dates. SDV (single dose vial) medications used. Description of the Procedure: Protocol guidelines were followed. The patient was placed in position over the fluoroscopy table. The target area was identified and the area prepped in the usual manner. Skin desensitized using vapocoolant spray. Skin & deeper tissues infiltrated with local anesthetic. Appropriate amount of time allowed to pass for local anesthetics to take effect. The procedure needles were then advanced to the target area. Proper needle placement secured. Negative aspiration confirmed. Solution injected in intermittent fashion, asking for systemic symptoms every 0.5cc of injectate. The needles were then removed and the area cleansed, making sure to leave some of the prepping solution back to take advantage of its long term bactericidal properties. There were no vitals filed for this visit.  Start Time:   hrs. End Time:   hrs. Materials:  Needle(s) Type: Regular needle Gauge: 22G Length: 3.5-in Medication(s): Please see orders for medications and dosing details.  Imaging Guidance:   Type of Imaging Technique: None used Indication(s): N/A Exposure Time: No patient exposure Contrast: None used. Fluoroscopic Guidance: N/A Ultrasound Guidance: N/A Interpretation: N/A  Antibiotic Prophylaxis:   Anti-infectives (From admission, onward)   None     Indication(s): None identified  Post-operative Assessment:  Post-procedure Vital Signs:  Pulse/HCG Rate:  Temp:   Resp:   BP:   SpO2:    EBL: None  Complications: No immediate post-treatment complications observed by team, or reported by patient.  Note: The patient tolerated the entire procedure well. A repeat set of vitals were taken after the procedure and the patient was kept under observation following institutional policy, for this type of procedure. Post-procedural neurological assessment was performed, showing return to baseline, prior to discharge. The patient was provided with post-procedure discharge instructions, including a section on how to identify potential problems. Should any problems arise concerning this procedure, the patient was given instructions to immediately contact us, at any time, without hesitation. In any case, we plan to contact the patient by telephone for a follow-up status report regarding this interventional procedure.  Comments:  No additional relevant information.  Plan of Care   Imaging Orders  No imaging studies ordered today   Procedure Orders    No procedure(s) ordered today    Medications ordered for procedure: No orders of the defined types were placed in this encounter.  Medications administered: Erica [name] had no medications administered during this visit.  See the medical record for exact dosing, route, and time of administration.  New Prescriptions   No medications on file   Disposition: Discharge home  Discharge Date & Time: 11/24/2017;   hrs.   Physician-requested Follow-up: No follow-ups on file.  Future Appointments  Date Time Provider Department  Center  11/24/2017  1:00 PM Delano Metz, MD [facility] None  12/01/2017  9:45 AM Barbette Merino, NP [facility] None  12/16/2017 10:30 AM Toney Reil, MD [facility] None  12/26/2017  1:40 PM Delma Freeze, FNP [facility] None   Primary Care Physician: Hillery Aldo, MD Location: [facility] Note by: Oswaldo Done, MD Date: 11/24/2017; Time: 8:46 AM  Disclaimer:  Medicine is not an Visual merchandiser. The only guarantee in medicine is that nothing is guaranteed. It is important to note that the decision to proceed with this intervention was based on the information collected from the patient. The Data and conclusions were drawn from the patient's questionnaire, the interview, and the physical examination. Because the information was provided in large part by the patient, it cannot be guaranteed that it has not been purposely or unconsciously manipulated. Every effort has been made to obtain as much relevant data as possible for this evaluation. It is important to note that the conclusions that lead to this procedure are derived in large part from the available data. Always take into account that the treatment will also be dependent on availability of resources and existing treatment guidelines, considered by other Pain Management Practitioners as being common knowledge and practice, at the time of the intervention. For Medico-Legal purposes, it is also important to point out that variation in procedural techniques and pharmacological choices are the acceptable norm. The indications, contraindications, technique, and results of the above procedure should only be interpreted and judged by a Board-Certified Interventional Pain Specialist with extensive familiarity and expertise in the same exact procedure and technique.

## 2017-12-01 ENCOUNTER — Encounter: Payer: Medicaid Other | Admitting: Nurse Practitioner

## 2017-12-05 DIAGNOSIS — M1610 Unilateral primary osteoarthritis, unspecified hip: Secondary | ICD-10-CM | POA: Insufficient documentation

## 2017-12-05 DIAGNOSIS — E669 Obesity, unspecified: Secondary | ICD-10-CM | POA: Insufficient documentation

## 2017-12-06 ENCOUNTER — Ambulatory Visit: Payer: Medicaid Other | Admitting: Nurse Practitioner

## 2017-12-16 ENCOUNTER — Ambulatory Visit: Payer: Medicaid Other | Admitting: Gastroenterology

## 2017-12-16 ENCOUNTER — Other Ambulatory Visit: Payer: Self-pay

## 2017-12-16 ENCOUNTER — Encounter: Payer: Self-pay | Admitting: Gastroenterology

## 2017-12-25 ENCOUNTER — Other Ambulatory Visit: Payer: Self-pay

## 2017-12-25 ENCOUNTER — Emergency Department
Admission: EM | Admit: 2017-12-25 | Discharge: 2017-12-25 | Disposition: A | Discharge status: Home / Self Care | Payer: Medicaid Other | Attending: Emergency Medicine | Admitting: Emergency Medicine

## 2017-12-25 ENCOUNTER — Encounter: Payer: Self-pay | Admitting: Emergency Medicine

## 2017-12-25 ENCOUNTER — Emergency Department: Payer: Medicaid Other

## 2017-12-25 DIAGNOSIS — Z87891 Personal history of nicotine dependence: Secondary | ICD-10-CM | POA: Insufficient documentation

## 2017-12-25 DIAGNOSIS — E119 Type 2 diabetes mellitus without complications: Secondary | ICD-10-CM | POA: Insufficient documentation

## 2017-12-25 DIAGNOSIS — Z7984 Long term (current) use of oral hypoglycemic drugs: Secondary | ICD-10-CM | POA: Insufficient documentation

## 2017-12-25 DIAGNOSIS — I110 Hypertensive heart disease with heart failure: Secondary | ICD-10-CM | POA: Diagnosis not present

## 2017-12-25 DIAGNOSIS — X500XXA Overexertion from strenuous movement or load, initial encounter: Secondary | ICD-10-CM | POA: Diagnosis not present

## 2017-12-25 DIAGNOSIS — Y9389 Activity, other specified: Secondary | ICD-10-CM | POA: Insufficient documentation

## 2017-12-25 DIAGNOSIS — Y999 Unspecified external cause status: Secondary | ICD-10-CM | POA: Insufficient documentation

## 2017-12-25 DIAGNOSIS — Y929 Unspecified place or not applicable: Secondary | ICD-10-CM | POA: Diagnosis not present

## 2017-12-25 DIAGNOSIS — S4991XA Unspecified injury of right shoulder and upper arm, initial encounter: Secondary | ICD-10-CM | POA: Diagnosis present

## 2017-12-25 DIAGNOSIS — S46911A Strain of unspecified muscle, fascia and tendon at shoulder and upper arm level, right arm, initial encounter: Secondary | ICD-10-CM

## 2017-12-25 DIAGNOSIS — I5032 Chronic diastolic (congestive) heart failure: Secondary | ICD-10-CM | POA: Insufficient documentation

## 2017-12-25 DIAGNOSIS — Z7982 Long term (current) use of aspirin: Secondary | ICD-10-CM | POA: Diagnosis not present

## 2017-12-25 DIAGNOSIS — J449 Chronic obstructive pulmonary disease, unspecified: Secondary | ICD-10-CM | POA: Diagnosis not present

## 2017-12-25 DIAGNOSIS — M25511 Pain in right shoulder: Secondary | ICD-10-CM

## 2017-12-25 DIAGNOSIS — Z79899 Other long term (current) drug therapy: Secondary | ICD-10-CM | POA: Insufficient documentation

## 2017-12-25 MED ORDER — KETOROLAC TROMETHAMINE 30 MG/ML IJ SOLN
30.0000 mg | Freq: Once | INTRAMUSCULAR | Status: AC
  Administered 2017-12-25: 30 mg via INTRAMUSCULAR
  Filled 2017-12-25: qty 1

## 2017-12-25 MED ORDER — HYDROCODONE-ACETAMINOPHEN 5-325 MG PO TABS: 1.0000 | ORAL_TABLET | Freq: Four times a day (QID) | ORAL | 0 refills | Status: DC | PRN

## 2017-12-25 NOTE — Discharge Instructions (Addendum)
Follow-up with your primary care provider if any continued problems in 7 to 10 days.  Begin taking Norco 1 every 6 hours as needed for moderate pain.  Continue taking meloxicam 15 mg as prescribed by your primary care provider for osteoarthritis.  You may use ice packs to your shoulder as needed for discomfort.  Wear sling for approximately 3 to 4 days for support.

## 2017-12-25 NOTE — ED Notes (Signed)
NAD noted at time of D/C. Pt denies questions or concerns. Pt ambulatory to the lobby at this time.  

## 2017-12-25 NOTE — ED Provider Notes (Signed)
[facility] Emergency Department Provider Note  ____________________________________________   None    (approximate)  I have reviewed the triage vital signs and the nursing notes.   HISTORY  Chief Complaint Shoulder Injury   HPI Erica [name] is a 52 y.o. female is here complaint of right shoulder pain.  Patient states that she was lifting her spouse approximately 5 days ago and has had pain in her shoulder since that time.  She states that she fell toward the hearth in the room but did not actually strike it.  She has not taken any over-the-counter medication for her pain.  Pain is increased with range of motion.  She rates her pain as an 8 out of 10.  Past Medical History:  Diagnosis Date  . (HFpEF) heart failure with preserved ejection fraction ([place])    a. 05/2015 Echo: EF 60-65%, no rwma, PASP 17mHg.  . Arthritis   . Asthma   . CHF (congestive heart failure) ([place]   . COPD (chronic obstructive pulmonary disease) ([place]   . Diabetes mellitus without complication ([place]    type II 03/2017  . Gastric ulcer   . Hyperlipidemia   . Hypertension   . OSA (obstructive sleep apnea)    a. did not tolerate CPAP.    Patient Active Problem List   Diagnosis Date Noted  . Arthropathy of left hip 11/08/2017  . Rib pain on right side 11/08/2017  . Spondylosis without myelopathy or radiculopathy, lumbosacral region 10/06/2017  . Vertigo 09/15/2017  . Diabetes ([place] 07/18/2017  . HTN (hypertension) 07/18/2017  . Chronic hip pain (Secondary source of pain) (Bilateral) (L>R) 05/02/2017  . 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 Hemarthrosis, left knee 11/10/2016  . Osteoarthritis of knee (Bilateral) (L>R) 09/08/2016  . Intermittent left thoracic Muscle cramps 09/08/2016  . Spasm of thoracolumbar muscle (Left) 09/08/2016  . Post-traumatic osteoarthritis of knee (Left) 09/08/2016  . Lumbar spondylosis 07/05/2016  . Chronic pain syndrome  06/28/2016  . Elevated sedimentation rate 06/28/2016  . Elevated C-reactive protein (CRP) 06/28/2016  . Neurogenic pain 06/28/2016  . Vitamin D deficiency 06/28/2016  . Long term current use of opiate analgesic 04/29/2016  . Long term prescription opiate use 04/29/2016  . Opiate use 04/29/2016  . Chronic low back pain (Primary Source of Pain) (Bilateral) (L>R) 04/29/2016  . [facility] ([facility]) (L>R) 04/29/2016  . Chronic neck pain (Bilateral) (R>L) 04/29/2016  . Chronic upper back pain (midline) 04/29/2016  . Chronic foot pain (bottom of feet) (Bilateral) (R>L) 04/29/2016  . Chronic hand pain (Bilateral) 04/29/2016  . Peripheral neuropathy, idiopathic (upper and lower extremity) 04/29/2016  . Chronic sacroiliac joint pain (Bilateral) (R>L) 04/29/2016  . Lumbar facet syndrome (Bilateral) (R>L) 04/29/2016  . Depression 03/05/2016  . Chronic diastolic heart failure ([place] 07/16/2015  . Smoker 07/16/2015  . Obstructive sleep apnea 07/16/2015  . Tachycardia 07/16/2015  . COPD, mild ([place]   . Renal insufficiency   . Pulmonary HTN ([place]   . Chest pain 06/14/2015  . Hyponatremia 06/14/2015  . Shortness of breath   . Swelling   . Cough   . COPD (chronic obstructive pulmonary disease) with acute bronchitis ([place]     Past Surgical History:  Procedure Laterality Date  . FLEXIBLE BRONCHOSCOPY N/A 06/20/2015   Procedure: FLEXIBLE BRONCHOSCOPY;  Surgeon: PLaverle Hobby MD;  Location: [facility] ORS;  Service: Pulmonary;  Laterality: N/A;  . KNEE SURGERY Left    8 knee surgeries  Prior to Admission medications   Medication Sig Start Date End Date Taking? Authorizing Provider  albuterol (PROAIR HFA) 108 (90 Base) MCG/ACT inhaler Inhale into the lungs 4 (four) times daily.    [provider]  aspirin EC 81 MG EC tablet Take 1 tablet (81 mg total) by mouth daily. 06/23/15   Demetrios Loll, MD  atorvastatin (LIPITOR) 80 MG tablet Take 80 mg by mouth  daily.    [provider]  benzonatate (TESSALON) 100 MG capsule take 1 capsule by mouth three times a day if needed for cough 09/30/17   [provider]  Blood Glucose Monitoring Suppl (ACCU-CHEK AVIVA PLUS) w/Device KIT See admin instructions. 10/20/17   [provider]  Cholecalciferol (VITAMIN D3) 2000 units capsule Take 1 capsule (2,000 Units total) by mouth daily. 09/14/17   Vevelyn Francois, NP  diltiazem (CARDIZEM CD) 120 MG 24 hr capsule Take 1 capsule (120 mg total) by mouth daily. 12/23/16   Minna Merritts, MD  diltiazem (CARDIZEM) 30 MG tablet Take 1 tablet (30 mg total) by mouth 3 (three) times daily as needed. 03/23/17   Minna Merritts, MD  DULoxetine (CYMBALTA) 30 MG capsule Take 30 mg by mouth.    [provider]  DULoxetine (CYMBALTA) 60 MG capsule Take 60 mg by mouth daily.    [provider]  fluconazole (DIFLUCAN) 150 MG tablet TAKE 1 TABLET NOW FOR YEAST INFECTION, MAY REPEAT IN 3 DAYS IF SYMPTOMS PERSIST 10/17/17   [provider]  Fluticasone-Salmeterol (ADVAIR DISKUS IN) Inhale into the lungs 2 (two) times daily.    [provider]  folic acid (FOLVITE) 1 MG tablet Take 1 mg by mouth daily.    [provider]  furosemide (LASIX) 40 MG tablet Take 40 mg by mouth 2 (two) times daily. Take an additional 40 mg in the am for weight gain as needed    [provider]  HYDROcodone-acetaminophen (NORCO/VICODIN) 5-325 MG tablet Take 1 tablet by mouth every 6 (six) hours as needed for moderate pain. 12/25/17   Johnn Hai, PA-C  losartan (COZAAR) 100 MG tablet Take 1/2 tablet (50 mg) by mouth twice daily    [provider]  Magnesium Oxide, Antacid, 500 MG CAPS once daily. 11/08/16   [provider]  meloxicam (MOBIC) 15 MG tablet Take 15 mg by mouth daily. 12/05/17   [provider]  metFORMIN (GLUCOPHAGE) 500 MG tablet Take 500 mg by mouth 2 (two) times daily with a meal.     [provider]  metFORMIN (GLUCOPHAGE-XR) 500 MG 24 hr tablet  11/19/17   [provider]  oxyCODONE (OXY IR/ROXICODONE) 5 MG immediate release tablet Take 1 tablet (5 mg total) by mouth every 8 (eight) hours as needed for severe pain. 09/14/17 10/20/17  Vevelyn Francois, NP  oxyCODONE (OXY IR/ROXICODONE) 5 MG immediate release tablet Take 1 tablet (5 mg total) by mouth every 8 (eight) hours as needed for severe pain. 10/20/17 11/19/17  Vevelyn Francois, NP  oxyCODONE (OXY IR/ROXICODONE) 5 MG immediate release tablet Take 1 tablet (5 mg total) by mouth every 8 (eight) hours as needed for severe pain. 11/19/17 12/19/17  Vevelyn Francois, NP  OXYGEN Inhale into the lungs. 2 liters at bedtime    [provider]  pantoprazole (PROTONIX) 40 MG tablet Take 1 tablet (40 mg total) by mouth daily. 06/23/15   Demetrios Loll, MD  potassium chloride SA (K-DUR,KLOR-CON) 20 MEQ tablet Take 20 mEq  by mouth 2 (two) times daily.    [provider]  pregabalin (LYRICA) 75 MG capsule Take 1 capsule (75 mg total) by mouth 3 (three) times daily. 10/20/17 12/19/17  Vevelyn Francois, NP  RA MAGNESIUM 500 MG CAPS  11/19/17   [provider]  sucralfate (CARAFATE) 1 G tablet Take 1 tablet (1 g total) by mouth 4 (four) times daily -  with meals and at bedtime. Patient taking differently: Take 1 g by mouth 2 (two) times daily.  06/23/15   Demetrios Loll, MD  tiotropium (SPIRIVA HANDIHALER) 18 MCG inhalation capsule Place 18 mcg into inhaler and inhale daily.    [provider]  tiZANidine (ZANAFLEX) 4 MG capsule Take 1 capsule (4 mg total) by mouth 3 (three) times daily as needed for muscle spasms. 09/14/17 10/25/17  Vevelyn Francois, NP  tiZANidine (ZANAFLEX) 4 MG capsule  11/19/17   [provider]    Allergies Tramadol and Penicillins  Family History  Problem Relation Age of Onset  . Lung cancer Father   . Breast cancer Mother        early 15's  . Diabetes Maternal Grandmother     Social  History Social History   Tobacco Use  . Smoking status: Former Smoker    Packs/day: 1.00    Years: 36.00    Pack years: 36.00    Types: Cigarettes    Last attempt to quit: 06/13/2015    Years since quitting: 2.5  . Smokeless tobacco: Never Used  Substance Use Topics  . Alcohol use: No    Alcohol/week: 0.0 oz    Frequency: Never    Comment: occasionaly  . Drug use: No    Review of Systems Constitutional: No fever/chills Cardiovascular: Denies chest pain. Respiratory: Denies shortness of breath. Musculoskeletal: Positive right shoulder pain. Skin: Negative for injury. Neurological: Negative for  focal weakness or numbness. ____________________________________________   PHYSICAL EXAM:  VITAL SIGNS: ED Triage Vitals  Enc Vitals Group     BP 12/25/17 0845 (!) 146/70     Pulse Rate 12/25/17 0845 82     Resp 12/25/17 0845 16     Temp 12/25/17 0845 98.6 F (37 C)     Temp Source 12/25/17 0845 Oral     SpO2 12/25/17 0845 97 %     Weight 12/25/17 0843 200 lb (90.7 kg)     Height 12/25/17 0843 5' 8"  (1.727 m)     Head Circumference --      Peak Flow --      Pain Score 12/25/17 0843 8     Pain Loc --      Pain Edu? --      Excl. in [place]? --    Constitutional: Alert and oriented. Well appearing and in no acute distress. Eyes: Conjunctivae are normal.  Head: Atraumatic. Neck: No stridor.   Cardiovascular: Normal rate, regular rhythm. Grossly normal heart sounds.  Good peripheral circulation. Respiratory: Normal respiratory effort.  No retractions. Lungs CTAB. Musculoskeletal: On examination of the right shoulder there is no gross deformity and no soft tissue injury seen.  There is minimal diffuse tenderness on palpation.  Range of motion with minimal restriction x4 planes.  No crepitus is appreciated.  Skin is intact. Neurologic:  Normal speech and language. No gross focal neurologic deficits are appreciated. No gait instability. Skin:  Skin is warm, dry and intact.  No  ecchymosis or abrasions were seen. Psychiatric: Mood and affect are normal. Speech and behavior  are normal.  ____________________________________________   LABS (all labs ordered are listed, but only abnormal results are displayed)  Labs Reviewed - No data to display  RADIOLOGY  ED MD interpretation:   Right shoulder x-ray is negative for fracture.  Official radiology report(s): Dg Shoulder Right  Result Date: 12/25/2017 CLINICAL DATA:  Pain after heavy lifting.  Limited range of motion EXAM: RIGHT SHOULDER - 2+ VIEW COMPARISON:  None. FINDINGS: Oblique, Y scapular, axillary images were obtained. No evident fracture or dislocation. There is mild generalized osteoarthritic change. No erosive change. Visualized right lung clear. IMPRESSION: Mild generalized osteoarthritic change.  No fracture or dislocation. Electronically Signed   By: Lowella Grip III M.D.   On: 12/25/2017 09:44    ____________________________________________   PROCEDURES  Procedure(s) performed: Sling applied by Jinny Blossom RN  Procedures  Critical Care performed: No  ____________________________________________   INITIAL IMPRESSION / ASSESSMENT AND PLAN / ED COURSE  As part of my medical decision making, I reviewed the following data within the electronic MEDICAL RECORD NUMBER Notes from prior ED visits and [place] Controlled Substance Database  Patient was given Toradol injection while in the ED.  She was placed in a sling for support and comfort.  She was given a prescription for Norco 1 every 6 hours as needed for pain.  Patient is encouraged to continue taking her meloxicam 15 mg daily per her primary care provider for osteoarthritis.  She is to follow-up with her PCP if any continued problems in 5 to 7 days.  ____________________________________________   FINAL CLINICAL IMPRESSION(S) / ED DIAGNOSES  Final diagnoses:  Acute pain of right shoulder  Shoulder strain, right, initial encounter     ED Discharge  Orders        Ordered    HYDROcodone-acetaminophen (NORCO/VICODIN) 5-325 MG tablet  Every 6 hours PRN     12/25/17 1023       Note:  This document was prepared using Dragon voice recognition software and may include unintentional dictation errors.    Johnn Hai, PA-C 12/25/17 1039    Lavonia Drafts, MD 12/25/17 1246

## 2017-12-25 NOTE — ED Triage Notes (Signed)
Injured right shoulder on Tuesday while assisting spouse up from floor.  C/O worsening shoulder pain with movement.

## 2017-12-26 ENCOUNTER — Ambulatory Visit: Payer: Medicaid Other | Attending: Nurse Practitioner | Admitting: Nurse Practitioner

## 2017-12-26 ENCOUNTER — Other Ambulatory Visit: Payer: Self-pay

## 2017-12-26 ENCOUNTER — Encounter: Payer: Self-pay | Admitting: Family

## 2017-12-26 ENCOUNTER — Ambulatory Visit: Payer: Medicaid Other | Attending: Family | Admitting: Family

## 2017-12-26 ENCOUNTER — Other Ambulatory Visit: Payer: Self-pay | Admitting: Cardiovascular Disease

## 2017-12-26 ENCOUNTER — Encounter: Payer: Self-pay | Admitting: Nurse Practitioner

## 2017-12-26 VITALS — BP 159/77 | HR 82 | Resp 18 | Ht 68.0 in | Wt 205.0 lb

## 2017-12-26 VITALS — BP 157/66 | HR 92 | Temp 98.2°F | Resp 16 | Ht 68.0 in | Wt 200.0 lb

## 2017-12-26 DIAGNOSIS — Z5181 Encounter for therapeutic drug level monitoring: Secondary | ICD-10-CM | POA: Insufficient documentation

## 2017-12-26 DIAGNOSIS — E119 Type 2 diabetes mellitus without complications: Secondary | ICD-10-CM | POA: Diagnosis not present

## 2017-12-26 DIAGNOSIS — I5032 Chronic diastolic (congestive) heart failure: Secondary | ICD-10-CM | POA: Insufficient documentation

## 2017-12-26 DIAGNOSIS — J449 Chronic obstructive pulmonary disease, unspecified: Secondary | ICD-10-CM | POA: Insufficient documentation

## 2017-12-26 DIAGNOSIS — I110 Hypertensive heart disease with heart failure: Secondary | ICD-10-CM | POA: Diagnosis not present

## 2017-12-26 DIAGNOSIS — Z803 Family history of malignant neoplasm of breast: Secondary | ICD-10-CM | POA: Insufficient documentation

## 2017-12-26 DIAGNOSIS — Z885 Allergy status to narcotic agent status: Secondary | ICD-10-CM | POA: Diagnosis not present

## 2017-12-26 DIAGNOSIS — M25562 Pain in left knee: Secondary | ICD-10-CM

## 2017-12-26 DIAGNOSIS — Z9981 Dependence on supplemental oxygen: Secondary | ICD-10-CM | POA: Insufficient documentation

## 2017-12-26 DIAGNOSIS — Z7984 Long term (current) use of oral hypoglycemic drugs: Secondary | ICD-10-CM | POA: Insufficient documentation

## 2017-12-26 DIAGNOSIS — Z7982 Long term (current) use of aspirin: Secondary | ICD-10-CM | POA: Insufficient documentation

## 2017-12-26 DIAGNOSIS — Z880 Allergy status to penicillin: Secondary | ICD-10-CM | POA: Diagnosis not present

## 2017-12-26 DIAGNOSIS — I272 Pulmonary hypertension, unspecified: Secondary | ICD-10-CM | POA: Diagnosis not present

## 2017-12-26 DIAGNOSIS — Z79891 Long term (current) use of opiate analgesic: Secondary | ICD-10-CM | POA: Insufficient documentation

## 2017-12-26 DIAGNOSIS — M792 Neuralgia and neuritis, unspecified: Secondary | ICD-10-CM | POA: Diagnosis not present

## 2017-12-26 DIAGNOSIS — Z09 Encounter for follow-up examination after completed treatment for conditions other than malignant neoplasm: Secondary | ICD-10-CM | POA: Insufficient documentation

## 2017-12-26 DIAGNOSIS — M533 Sacrococcygeal disorders, not elsewhere classified: Secondary | ICD-10-CM | POA: Diagnosis not present

## 2017-12-26 DIAGNOSIS — G8929 Other chronic pain: Secondary | ICD-10-CM

## 2017-12-26 DIAGNOSIS — Z7951 Long term (current) use of inhaled steroids: Secondary | ICD-10-CM | POA: Insufficient documentation

## 2017-12-26 DIAGNOSIS — Z9889 Other specified postprocedural states: Secondary | ICD-10-CM | POA: Insufficient documentation

## 2017-12-26 DIAGNOSIS — I509 Heart failure, unspecified: Secondary | ICD-10-CM | POA: Insufficient documentation

## 2017-12-26 DIAGNOSIS — Z87891 Personal history of nicotine dependence: Secondary | ICD-10-CM | POA: Insufficient documentation

## 2017-12-26 DIAGNOSIS — G4733 Obstructive sleep apnea (adult) (pediatric): Secondary | ICD-10-CM | POA: Diagnosis not present

## 2017-12-26 DIAGNOSIS — R252 Cramp and spasm: Secondary | ICD-10-CM | POA: Diagnosis not present

## 2017-12-26 DIAGNOSIS — E785 Hyperlipidemia, unspecified: Secondary | ICD-10-CM | POA: Diagnosis not present

## 2017-12-26 DIAGNOSIS — M62830 Muscle spasm of back: Secondary | ICD-10-CM | POA: Insufficient documentation

## 2017-12-26 DIAGNOSIS — R5383 Other fatigue: Secondary | ICD-10-CM | POA: Diagnosis not present

## 2017-12-26 DIAGNOSIS — G609 Hereditary and idiopathic neuropathy, unspecified: Secondary | ICD-10-CM | POA: Insufficient documentation

## 2017-12-26 DIAGNOSIS — G894 Chronic pain syndrome: Secondary | ICD-10-CM | POA: Diagnosis not present

## 2017-12-26 DIAGNOSIS — M47816 Spondylosis without myelopathy or radiculopathy, lumbar region: Secondary | ICD-10-CM | POA: Insufficient documentation

## 2017-12-26 DIAGNOSIS — Z801 Family history of malignant neoplasm of trachea, bronchus and lung: Secondary | ICD-10-CM | POA: Insufficient documentation

## 2017-12-26 DIAGNOSIS — Z79899 Other long term (current) drug therapy: Secondary | ICD-10-CM | POA: Insufficient documentation

## 2017-12-26 DIAGNOSIS — M1732 Unilateral post-traumatic osteoarthritis, left knee: Secondary | ICD-10-CM | POA: Diagnosis not present

## 2017-12-26 DIAGNOSIS — I10 Essential (primary) hypertension: Secondary | ICD-10-CM

## 2017-12-26 DIAGNOSIS — Z833 Family history of diabetes mellitus: Secondary | ICD-10-CM | POA: Insufficient documentation

## 2017-12-26 DIAGNOSIS — M25561 Pain in right knee: Secondary | ICD-10-CM

## 2017-12-26 MED ORDER — TIZANIDINE HCL 4 MG PO CAPS: 4.0000 mg | ORAL_CAPSULE | Freq: Three times a day (TID) | ORAL | 1 refills | Status: DC | PRN

## 2017-12-26 MED ORDER — OXYCODONE HCL 5 MG PO TABS: 5.0000 mg | ORAL_TABLET | Freq: Three times a day (TID) | ORAL | 0 refills | Status: DC | PRN

## 2017-12-26 MED ORDER — PREGABALIN 75 MG PO CAPS: 75.0000 mg | ORAL_CAPSULE | Freq: Three times a day (TID) | ORAL | 1 refills | Status: DC

## 2017-12-26 NOTE — Progress Notes (Signed)
Nursing Pain Medication Assessment:  Safety precautions to be maintained throughout the outpatient stay will include: orient to surroundings, keep bed in low position, maintain call bell within reach at all times, provide assistance with transfer out of bed and ambulation.  Medication Inspection Compliance: Pill count conducted under aseptic conditions, in front of the patient. Neither the pills nor the bottle was removed from the patient's sight at any time. Once count was completed pills were immediately returned to the patient in their original bottle.  Medication #1: Hydrocodone/APAP Pill/Patch Count: 7 of 12 pills remain Pill/Patch Appearance: Markings consistent with prescribed medication Bottle Appearance: Standard pharmacy container. Clearly labeled. Filled Date: 6 / 09 / 2018 Last Medication intake:  Yesterday  Medication #2: Oxycodone IR Pill/Patch Count: 0 of 90 pills remain Pill/Patch Appearance: Markings consistent with prescribed medication Bottle Appearance: Standard pharmacy container. Clearly labeled. Filled Date: 5 / 6 / 2019 Last Medication intake:  12/22/17

## 2017-12-26 NOTE — Progress Notes (Signed)
Patient's Name: Erica [name]  MRN: [id number]  Referring Provider: Denton Lank, MD  DOB: 1966-04-10  PCP: Denton Lank, MD  DOS: 12/26/2017  Note by: Vevelyn Francois NP  Service setting: Ambulatory outpatient  Specialty: Interventional Pain Management  Location: [facility] (AMB) Pain Management Facility    Patient type: Established    Primary Reason(s) for Visit: Encounter for prescription drug management & post-procedure evaluation of chronic illness with mild to moderate exacerbation(Level of risk: moderate) CC: Back Pain (lower); Hip Pain; Knee Pain (bilateral); and Shoulder Pain (right)  HPI  Erica [name] is a 52 y.o. year old, female patient, who comes today for a post-procedure evaluation and medication management. She has Chest pain; Hyponatremia; Shortness of breath; Swelling; Cough; COPD (chronic obstructive pulmonary disease) with acute bronchitis ([place]); Renal insufficiency; Pulmonary HTN ([place]); COPD, mild ([place]); Chronic diastolic heart failure ([place]); Smoker; Obstructive sleep apnea; Tachycardia; Depression; Long term current use of opiate analgesic; Long term prescription opiate use; Opiate use; Chronic low back pain (Primary Source of Pain) (Bilateral) (L>R); Chronic knee pain [facility] source of pain) (Bilateral) (L>R); Chronic neck pain (Bilateral) (R>L); Chronic upper back pain (midline); Chronic foot pain (bottom of feet) (Bilateral) (R>L); Chronic hand pain (Bilateral); Peripheral neuropathy, idiopathic (upper and lower extremity); Chronic sacroiliac joint pain (Bilateral) (R>L); Lumbar facet syndrome (Bilateral) (R>L); Chronic pain syndrome; Elevated sedimentation rate; Elevated C-reactive protein (CRP); Neurogenic pain; Vitamin D deficiency; Lumbar spondylosis; Osteoarthritis of knee (Bilateral) (L>R); Intermittent left thoracic Muscle cramps; Spasm of thoracolumbar muscle (Left); Post-traumatic osteoarthritis of knee (Left); Hemarthrosis, left knee; 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);  Chronic hip pain (Secondary source of pain) (Bilateral) (L>R); Diabetes ([place]); HTN (hypertension); Vertigo; Spondylosis without myelopathy or radiculopathy, lumbosacral region; Arthropathy of left hip; Rib pain on right side; Obesity (BMI 30.0-34.9); and Primary localized osteoarthrosis, pelvic region and thigh on their problem list. Her primarily concern today is the Back Pain (lower); Hip Pain; Knee Pain (bilateral); and Shoulder Pain (right)  Pain Assessment: Location: Lower Back Radiating: hips/ bilateral back of left leg Onset: More than a month ago Duration: Chronic pain Quality: Aching, Constant Severity: 5 /10 (subjective, self-reported pain score)  Note: Reported level is compatible with observation.                          Effect on ADL: prolonged walking, prolonged sitting Timing: Constant Modifying factors: medication, rest BP: (!) 157/66  HR: 92  Erica [name] was last seen on 11/24/2017 for a procedure. During today's appointment we reviewed Erica [name]'s post-procedure results, as well as her outpatient medication regimen. She denies any concerns today. She continues with the Hyalgan injections.   Further details on both, my assessment(s), as well as the proposed treatment plan, please see below.  Controlled Substance Pharmacotherapy Assessment REMS (Risk Evaluation and Mitigation Strategy)  Analgesic:Oxycodone IR 5 mg 1 tablet by mouth every 8 hours (32m/dayof oxycodone) (22.5MME/day) MME/day:22.535mday.   GaIgnatius SpeckingRN  12/26/2017  1:15 PM  Sign at close encounter Nursing Pain Medication Assessment:  Safety precautions to be maintained throughout the outpatient stay will include: orient to surroundings, keep bed in low position, maintain call bell within reach at all times, provide assistance with transfer out of bed and ambulation.  Medication Inspection Compliance: Pill count conducted under aseptic conditions, in front of the patient. Neither the pills nor  the bottle was removed from the patient's sight at any time. Once count was completed pills were immediately returned  to the patient in their original bottle.  Medication #1: Hydrocodone/APAP Pill/Patch Count: 7 of 12 pills remain Pill/Patch Appearance: Markings consistent with prescribed medication Bottle Appearance: Standard pharmacy container. Clearly labeled. Filled Date: 6 / 09 / 2018 Last Medication intake:  Yesterday  Medication #2: Oxycodone IR Pill/Patch Count: 0 of 90 pills remain Pill/Patch Appearance: Markings consistent with prescribed medication Bottle Appearance: Standard pharmacy container. Clearly labeled. Filled Date: 5 / 6 / 2019 Last Medication intake:  12/22/17   Pharmacokinetics: Liberation and absorption (onset of action): WNL Distribution (time to peak effect): WNL Metabolism and excretion (duration of action): WNL         Pharmacodynamics: Desired effects: Analgesia: Erica [name] reports >50% benefit. Functional ability: Patient reports that medication allows her to accomplish basic ADLs Clinically meaningful improvement in function (CMIF): Sustained CMIF goals met Perceived effectiveness: Described as relatively effective, allowing for increase in activities of daily living (ADL) Undesirable effects: Side-effects or Adverse reactions: None reported Monitoring: [place] PMP: Online review of the past 80-monthperiod conducted. Compliant with practice rules and regulations Last UDS on record: Summary  Date Value Ref Range Status  09/14/2017 FINAL  Final    Comment:    ==================================================================== TOXASSURE SELECT 13 (MW) ==================================================================== Test                             Result       Flag       Units Drug Absent but Declared for Prescription Verification   Oxycodone                      Not Detected UNEXPECTED ng/mg  creat ==================================================================== Test                      Result    Flag   Units      Ref Range   Creatinine              35               mg/dL      >=20 ==================================================================== Declared Medications:  The flagging and interpretation on this report are based on the  following declared medications.  Unexpected results may arise from  inaccuracies in the declared medications.  **Note: The testing scope of this panel includes these medications:  Oxycodone  **Note: The testing scope of this panel does not include following  reported medications:  Albuterol  Aspirin (Aspirin 81)  Atorvastatin  Diltiazem  Duloxetine  Fluticasone  Folic acid  Furosemide  Losartan (Losartan Potassium)  Magnesium Oxide  Metformin  Oxygen  Pantoprazole  Potassium  Pregabalin  Salmeterol  Sucralfate  Tiotropium  Tizanidine  Vitamin D3 ==================================================================== For clinical consultation, please call [phone] ====================================================================    UDS interpretation: Compliant          Medication Assessment Form: Reviewed. Patient indicates being compliant with therapy Treatment compliance: Compliant Risk Assessment Profile: Aberrant behavior: See prior evaluations. None observed or detected today Comorbid factors increasing risk of overdose: See prior notes. No additional risks detected today Risk of substance use disorder (SUD): Low  ORT Scoring interpretation table:  Score <3 = Low Risk for SUD  Score between 4-7 = Moderate Risk for SUD  Score >8 = High Risk for Opioid Abuse   Risk Mitigation Strategies:  Patient Counseling: Covered Patient-Prescriber Agreement (PPA): Present and active  Notification to other healthcare providers: Done  Pharmacologic Plan: No change in therapy, at this time.             Post-Procedure  Assessment  4/9 and 23 /2019 Procedure:Hyalagan knee injections Pre-procedure pain score:        /10 Post-procedure pain score: 0/10         Influential Factors: BMI: 30.41 kg/m Intra-procedural challenges: None observed.         Assessment challenges: None detected.              Reported side-effects: None.        Post-procedural adverse reactions or complications: None reported         Sedation: Please see nurses note. When no sedatives are used, the analgesic levels obtained are directly associated to the effectiveness of the local anesthetics. However, when sedation is provided, the level of analgesia obtained during the initial 1 hour following the intervention, is believed to be the result of a combination of factors. These factors may include, but are not limited to: 1. The effectiveness of the local anesthetics used. 2. The effects of the analgesic(s) and/or anxiolytic(s) used. 3. The degree of discomfort experienced by the patient at the time of the procedure. 4. The patients ability and reliability in recalling and recording the events. 5. The presence and influence of possible secondary gains and/or psychosocial factors. Reported result: Relief experienced during the 1st hour after the procedure: 100 % (Ultra-Short Term Relief)            Interpretative annotation: Clinically appropriate result. Analgesia during this period is likely to be Local Anesthetic and/or IV Sedative (Analgesic/Anxiolytic) related.          Effects of local anesthetic: The analgesic effects attained during this period are directly associated to the localized infiltration of local anesthetics and therefore cary significant diagnostic value as to the etiological location, or anatomical origin, of the pain. Expected duration of relief is directly dependent on the pharmacodynamics of the local anesthetic used. Long-acting (4-6 hours) anesthetics used.  Reported result: Relief during the next 4 to 6 hour after the  procedure: 100 % (Short-Term Relief)            Interpretative annotation: Clinically appropriate result. Analgesia during this period is likely to be Local Anesthetic-related.          Long-term benefit: Defined as the period of time past the expected duration of local anesthetics (1 hour for short-acting and 4-6 hours for long-acting). With the possible exception of prolonged sympathetic blockade from the local anesthetics, benefits during this period are typically attributed to, or associated with, other factors such as analgesic sensory neuropraxia, antiinflammatory effects, or beneficial biochemical changes provided by agents other than the local anesthetics.  Reported result: Extended relief following procedure: 30 % (Long-Term Relief)            Interpretative annotation: Clinically appropriate result. Good relief. No permanent benefit expected. Inflammation plays a part in the etiology to the pain.          Current benefits: Defined as reported results that persistent at this point in time.   Analgesia: 25-50 %            Function: Somewhat improved ROM: Somewhat improved Interpretative annotation: Partial relief.                Interpretation: Results would suggest a successful intervention.                  Plan:  Please see "Plan of Care" for details.                Laboratory Chemistry  Inflammation Markers (CRP: Acute Phase) (ESR: Chronic Phase) Lab Results  Component Value Date   CRP 1.3 (H) 04/30/2016   ESRSEDRATE 31 (H) 04/30/2016                         Rheumatology Markers Lab Results  Component Value Date   RF <10.0 06/29/2016   ANA Negative 06/29/2016   LABURIC 7.1 (H) 06/19/2015                        Renal Function Markers Lab Results  Component Value Date   BUN 9 09/22/2017   CREATININE 0.87 09/22/2017   BCR 15 04/22/2016   GFRAA >60 09/22/2017   GFRNONAA >60 09/22/2017                             Hepatic Function Markers Lab Results  Component  Value Date   AST 27 06/14/2016   ALT 26 06/14/2016   ALBUMIN 4.2 06/14/2016   ALKPHOS 83 06/14/2016   LIPASE 90 02/10/2013                        Electrolytes Lab Results  Component Value Date   NA 138 09/22/2017   K 3.9 09/22/2017   CL 97 (L) 09/22/2017   CALCIUM 9.4 09/22/2017   MG 1.9 04/30/2016                        Neuropathy Markers Lab Results  Component Value Date   VITAMINB12 248 04/30/2016   HGBA1C 6.2 (H) 06/14/2015   HIV NON REACTIVE 06/14/2015                        Bone Pathology Markers Lab Results  Component Value Date   25OHVITD1 15 (L) 04/30/2016   25OHVITD2 <1.0 04/30/2016   25OHVITD3 15 04/30/2016                         Coagulation Parameters Lab Results  Component Value Date   PLT 280 04/08/2017                        Cardiovascular Markers Lab Results  Component Value Date   BNP 42.0 06/14/2016   CKTOTAL 56 03/10/2013   CKMB 0.7 03/10/2013   TROPONINI <0.03 06/14/2016   HGB 13.4 04/08/2017   HCT 39.9 04/08/2017                         CA Markers No results found for: CEA, CA125, LABCA2                      Note: Lab results reviewed.  Recent Diagnostic Imaging Results  DG Shoulder Right CLINICAL DATA:  Pain after heavy lifting.  Limited range of motion  EXAM: RIGHT SHOULDER - 2+ VIEW  COMPARISON:  None.  FINDINGS: Oblique, Y scapular, axillary images were obtained. No evident fracture or dislocation. There is mild generalized osteoarthritic change. No erosive change. Visualized right lung clear.  IMPRESSION: Mild generalized osteoarthritic change.  No fracture or dislocation.  Electronically Signed  By: Lowella Grip III M.D.   On: 12/25/2017 09:44  Complexity Note: Imaging results reviewed. Results shared with Erica [name], using [employer].                         Meds   Current Outpatient Medications:  .  albuterol (PROAIR HFA) 108 (90 Base) MCG/ACT inhaler, Inhale into the lungs 4 (four) times  daily., Disp: , Rfl:  .  aspirin EC 81 MG EC tablet, Take 1 tablet (81 mg total) by mouth daily., Disp: 30 tablet, Rfl: 0 .  atorvastatin (LIPITOR) 80 MG tablet, Take 80 mg by mouth daily., Disp: , Rfl:  .  Blood Glucose Monitoring Suppl (ACCU-CHEK AVIVA PLUS) w/Device KIT, See admin instructions., Disp: , Rfl: 0 .  CARTIA XT 120 MG 24 hr capsule, take 1 capsule by mouth once daily, Disp: 30 capsule, Rfl: 11 .  Cholecalciferol (VITAMIN D3) 2000 units capsule, Take 1 capsule (2,000 Units total) by mouth daily., Disp: 30 capsule, Rfl: PRN .  diltiazem (CARDIZEM) 30 MG tablet, Take 1 tablet (30 mg total) by mouth 3 (three) times daily as needed., Disp: 90 tablet, Rfl: 3 .  DULoxetine (CYMBALTA) 30 MG capsule, Take 30 mg by mouth., Disp: , Rfl:  .  DULoxetine (CYMBALTA) 60 MG capsule, Take 60 mg by mouth daily., Disp: , Rfl:  .  fluconazole (DIFLUCAN) 150 MG tablet, TAKE 1 TABLET NOW FOR YEAST INFECTION, MAY REPEAT IN 3 DAYS IF SYMPTOMS PERSIST, Disp: , Rfl: 0 .  Fluticasone-Salmeterol (ADVAIR DISKUS IN), Inhale into the lungs 2 (two) times daily., Disp: , Rfl:  .  folic acid (FOLVITE) 1 MG tablet, Take 1 mg by mouth daily., Disp: , Rfl:  .  furosemide (LASIX) 40 MG tablet, Take 40 mg by mouth daily. Take an additional 40 mg in the am for weight gain as needed, Disp: , Rfl:  .  HYDROcodone-acetaminophen (NORCO/VICODIN) 5-325 MG tablet, Take 1 tablet by mouth every 6 (six) hours as needed for moderate pain., Disp: 12 tablet, Rfl: 0 .  losartan (COZAAR) 100 MG tablet, Take 1/2 tablet (50 mg) by mouth twice daily, Disp: , Rfl:  .  Magnesium Oxide, Antacid, 500 MG CAPS, once daily., Disp: , Rfl:  .  metFORMIN (GLUCOPHAGE) 500 MG tablet, Take 500 mg by mouth 2 (two) times daily with a meal., Disp: , Rfl:  .  [START ON 01/25/2018] oxyCODONE (OXY IR/ROXICODONE) 5 MG immediate release tablet, Take 1 tablet (5 mg total) by mouth every 8 (eight) hours as needed for severe pain. (Patient taking differently: Take 5  mg by mouth every 8 (eight) hours as needed for severe pain. Patient is taking 3 times daily scheduled), Disp: 90 tablet, Rfl: 0 .  OXYGEN, Inhale into the lungs. 2 liters at bedtime, Disp: , Rfl:  .  pantoprazole (PROTONIX) 40 MG tablet, Take 1 tablet (40 mg total) by mouth daily., Disp: 30 tablet, Rfl: 0 .  potassium chloride SA (K-DUR,KLOR-CON) 20 MEQ tablet, Take 20 mEq by mouth 2 (two) times daily., Disp: , Rfl:  .  pregabalin (LYRICA) 75 MG capsule, Take 1 capsule (75 mg total) by mouth 3 (three) times daily., Disp: 90 capsule, Rfl: 1 .  sucralfate (CARAFATE) 1 G tablet, Take 1 tablet (1 g total) by mouth 4 (four) times daily -  with meals and at bedtime. (Patient taking differently: Take 1 g by mouth 2 (two) times daily. ), Disp: 120 tablet, Rfl: 0 .  tiotropium (SPIRIVA HANDIHALER) 18 MCG inhalation capsule, Place 18 mcg into inhaler and inhale daily., Disp: , Rfl:  .  tiZANidine (ZANAFLEX) 4 MG capsule, Take 1 capsule (4 mg total) by mouth 3 (three) times daily as needed for muscle spasms., Disp: 90 capsule, Rfl: 1  ROS  Constitutional: Denies any fever or chills Gastrointestinal: No reported hemesis, hematochezia, vomiting, or acute GI distress Musculoskeletal: Denies any acute onset joint swelling, redness, loss of ROM, or weakness Neurological: No reported episodes of acute onset apraxia, aphasia, dysarthria, agnosia, amnesia, paralysis, loss of coordination, or loss of consciousness  Allergies  Erica [name] is allergic to tramadol and penicillins.  Grenelefe  Drug: Erica [name]  reports that she does not use drugs. Alcohol:  reports that she does not drink alcohol. Tobacco:  reports that she quit smoking about 2 years ago. Her smoking use included cigarettes. She has a 36.00 pack-year smoking history. She has never used smokeless tobacco. Medical:  has a past medical history of (HFpEF) heart failure with preserved ejection fraction ([place]), Arthritis, Asthma, CHF (congestive heart failure)  ([place]), COPD (chronic obstructive pulmonary disease) ([place]), Diabetes mellitus without complication ([place]), Gastric ulcer, Hyperlipidemia, Hypertension, OSA (obstructive sleep apnea), and Shoulder injury. Surgical: Erica [name]  has a past surgical history that includes Knee surgery (Left) and Flexible bronchoscopy (N/A, 06/20/2015). Family: family history includes Breast cancer in her mother; Diabetes in her maternal grandmother; Lung cancer in her father.  Constitutional Exam  General appearance: Well nourished, well developed, and well hydrated. In no apparent acute distress Vitals:   12/26/17 1301  BP: (!) 157/66  Pulse: 92  Resp: 16  Temp: 98.2 F (36.8 C)  SpO2: 97%  Weight: 200 lb (90.7 kg)  Height: _0  (1.727 m)   BMI Assessment: Estimated body mass index is 30.41 kg/m as calculated from the following:   Height as of this encounter: _1  (1.727 m).   Weight as of this encounter: 200 lb (90.7 kg). Psych/Mental status: Alert, oriented x 3 (person, place, & time)       Eyes: PERLA Respiratory: No evidence of acute respiratory distress  Upper Extremity (UE) Exam    Side: Right upper extremity  Side: Left upper extremity  Skin & Extremity Inspection: Skin color, temperature, and hair growth are WNL. No peripheral edema or cyanosis. No masses, redness, swelling, asymmetry, or associated skin lesions. No contractures.  Skin & Extremity Inspection: Sling worn  Functional ROM: Unrestricted ROM          Functional ROM: Unrestricted ROM          Muscle Tone/Strength: Functionally intact. No obvious neuro-muscular anomalies detected.  Muscle Tone/Strength: Functionally intact. No obvious neuro-muscular anomalies detected.  Sensory (Neurological): Unimpaired          Sensory (Neurological): Unimpaired          Palpation: No palpable anomalies              Palpation: No palpable anomalies              Provocative Test(s):  Phalen's test: deferred Tinel's test: deferred Apley's scratch test  (touch opposite shoulder):  Action 1 (Across chest): deferred Action 2 (Overhead): deferred Action 3 (LB reach): deferred   Provocative Test(s):  Phalen's test: deferred Tinel's test: deferred Apley's scratch test (touch opposite shoulder):  Action 1 (Across chest): deferred Action 2 (Overhead): deferred Action 3 (LB reach): deferred     Lumbar Spine Area Exam  Skin & Axial Inspection: No masses,  redness, or swelling Alignment: Symmetrical Functional ROM: Unrestricted ROM       Stability: No instability detected Muscle Tone/Strength: Functionally intact. No obvious neuro-muscular anomalies detected. Sensory (Neurological): Unimpaired Palpation: No palpable anomalies       Provocative Tests: Lumbar Hyperextension/rotation test: deferred today       Lumbar quadrant test (Kemp's test): deferred today       Lumbar Lateral bending test: deferred today       Patrick's Maneuver: deferred today                   FABER test: deferred today       Thigh-thrust test: deferred today       S-I compression test: deferred today       S-I distraction test: deferred today        Gait & Posture Assessment  Ambulation: Unassisted Gait: Relatively normal for age and body habitus Posture: WNL   Lower Extremity Exam    Side: Right lower extremity  Side: Left lower extremity  Stability: No instability observed          Stability: No instability observed          Skin & Extremity Inspection: Skin color, temperature, and hair growth are WNL. No peripheral edema or cyanosis. No masses, redness, swelling, asymmetry, or associated skin lesions. No contractures.  Skin & Extremity Inspection: Skin color, temperature, and hair growth are WNL. No peripheral edema or cyanosis. No masses, redness, swelling, asymmetry, or associated skin lesions. No contractures.  Functional ROM: Unrestricted ROM                  Functional ROM: Unrestricted ROM                  Muscle Tone/Strength: Functionally intact. No  obvious neuro-muscular anomalies detected.  Muscle Tone/Strength: Functionally intact. No obvious neuro-muscular anomalies detected.  Sensory (Neurological): Unimpaired  Sensory (Neurological): Unimpaired  Palpation: No palpable anomalies  Palpation: No palpable anomalies   Assessment  Primary Diagnosis & Pertinent Problem List: The primary encounter diagnosis was Lumbar spondylosis. Diagnoses of Chronic sacroiliac joint pain (Bilateral) (R>L), Chronic knee pain (Tertiary source of pain) (Bilateral) (L>R), Chronic pain syndrome, Peripheral neuropathy, idiopathic (upper and lower extremity), Neurogenic pain, Intermittent left thoracic Muscle cramps, Spasm of thoracolumbar muscle (Left), and Post-traumatic osteoarthritis of knee (Left) were also pertinent to this visit.  Status Diagnosis  Controlled Controlled Controlled 1. Lumbar spondylosis   2. Chronic sacroiliac joint pain (Bilateral) (R>L)   3. Chronic knee pain [facility] source of pain) (Bilateral) (L>R)   4. Chronic pain syndrome   5. Peripheral neuropathy, idiopathic (upper and lower extremity)   6. Neurogenic pain   7. Intermittent left thoracic Muscle cramps   8. Spasm of thoracolumbar muscle (Left)   9. Post-traumatic osteoarthritis of knee (Left)     Problems updated and reviewed during this visit: Problem  Obesity (Bmi 30.0-34.9)  Primary Localized Osteoarthrosis, Pelvic Region and Thigh   Plan of Care  Pharmacotherapy (Medications Ordered): Meds ordered this encounter  Medications  . oxyCODONE (OXY IR/ROXICODONE) 5 MG immediate release tablet    Sig: Take 1 tablet (5 mg total) by mouth every 8 (eight) hours as needed for severe pain.    Dispense:  90 tablet    Refill:  0    Do not place this medication, or any other prescription from our practice, on "Automatic Refill". Patient may have prescription filled one day early if pharmacy is closed  on scheduled refill date. Do not fill until:01/25/2018 To last until: 02/24/2018     Order Specific Question:   Supervising Provider    Answer:   Milinda Pointer [phone]  . pregabalin (LYRICA) 75 MG capsule    Sig: Take 1 capsule (75 mg total) by mouth 3 (three) times daily.    Dispense:  90 capsule    Refill:  1    Do not add this medication to the electronic "Automatic Refill" notification system. Patient may have prescription filled one day early if pharmacy is closed on scheduled refill date.    Order Specific Question:   Supervising Provider    Answer:   Milinda Pointer [phone]  . tiZANidine (ZANAFLEX) 4 MG capsule    Sig: Take 1 capsule (4 mg total) by mouth 3 (three) times daily as needed for muscle spasms.    Dispense:  90 capsule    Refill:  1    Do not place this medication, or any other prescription from our practice, on "Automatic Refill". Patient may have prescription filled one day early if pharmacy is closed on scheduled refill date.    Order Specific Question:   Supervising Provider    Answer:   Milinda Pointer [phone]  . DISCONTD: oxyCODONE (OXY IR/ROXICODONE) 5 MG immediate release tablet    Sig: Take 1 tablet (5 mg total) by mouth every 8 (eight) hours as needed for severe pain.    Dispense:  90 tablet    Refill:  0    Do not place this medication, or any other prescription from our practice, on "Automatic Refill". Patient may have prescription filled one day early if pharmacy is closed on scheduled refill date. Do not fill until: 12/26/2017 To last until:01/25/2018    Order Specific Question:   Supervising Provider    Answer:   Milinda Pointer [[id number]]   New Prescriptions   No medications on file   Medications administered today: Yannis A. Freda had no medications administered during this visit. Lab-work, procedure(s), and/or referral(s): No orders of the defined types were placed in this encounter.  Imaging and/or referral(s): None  Interventional therapies: Planned, scheduled, and/or pending: As scheduled    Considering:  Diagnostic bilateral lumbar facet block #3 Possible bilateral lumbar facet radiofrequency ablation  Possible bilateral diagnostic sacroiliac joint block  Possible bilateral intra-articular hip joint injection  Possible bilateral intra-articular knee joint injection Possible bilateral genicular nerve block  Possible bilateral knee joint radiofrequency ablation.  Possible left-sided lumbar epidural steroid injection Possible right-sided cervical epidural steroid injection Possible bilateral diagnostic cervical facet block Possible bilateral diagnostic greater occipital nerve blocks Possible greater occipital nerve radiofrequency ablation.   Palliative PRN treatment(s):  Palliative bilateral lumbar facet radiofrequency ablation   Provider-requested follow-up: Return in about 6 weeks (around 02/06/2018) for MedMgmt with Me Donella Stade Edison Pace).  Future Appointments  Date Time Provider [place]  03/27/2018  2:20 PM Alisa Graff, [place] [facility] None   Primary Care Physician: Denton Lank, MD Location: [facility] Note by: Vevelyn Francois NP Date: 12/26/2017; Time: 3:45 PM  Pain Score Disclaimer: We use the NRS-11 scale. This is a self-reported, subjective measurement of pain severity with only modest accuracy. It is used primarily to identify changes within a particular patient. It must be understood that outpatient pain scales are significantly less accurate that those used for research, where they can be applied under ideal controlled circumstances with minimal exposure to variables. In reality, the score is likely to be  a combination of pain intensity and pain affect, where pain affect describes the degree of emotional arousal or changes in action readiness caused by the sensory experience of pain. Factors such as social and work situation, setting, emotional state, anxiety levels, expectation, and prior pain experience may influence  pain perception and show large inter-individual differences that may also be affected by time variables.  Patient instructions provided during this appointment: Patient Instructions  ____________________________________________________________________________________________  Medication Rules  Applies to: All patients receiving prescriptions (written or electronic).  Pharmacy of record: Pharmacy where electronic prescriptions will be sent. If written prescriptions are taken to a different pharmacy, please inform the nursing staff. The pharmacy listed in the electronic medical record should be the one where you would like electronic prescriptions to be sent.  Prescription refills: Only during scheduled appointments. Applies to both, written and electronic prescriptions.  NOTE: The following applies primarily to controlled substances (Opioid* Pain Medications).   Patient's responsibilities: 1. Pain Pills: Bring all pain pills to every appointment (except for procedure appointments). 2. Pill Bottles: Bring pills in original pharmacy bottle. Always bring newest bottle. Bring bottle, even if empty. 3. Medication refills: You are responsible for knowing and keeping track of what medications you need refilled. The day before your appointment, write a list of all prescriptions that need to be refilled. Bring that list to your appointment and give it to the admitting nurse. Prescriptions will be written only during appointments. If you forget a medication, it will not be "Called in", "Faxed", or "electronically sent". You will need to get another appointment to get these prescribed. 4. Prescription Accuracy: You are responsible for carefully inspecting your prescriptions before leaving our office. Have the discharge nurse carefully go over each prescription with you, before taking them home. Make sure that your name is accurately spelled, that your address is correct. Check the name and dose of your  medication to make sure it is accurate. Check the number of pills, and the written instructions to make sure they are clear and accurate. Make sure that you are given enough medication to last until your next medication refill appointment. 5. Taking Medication: Take medication as prescribed. Never take more pills than instructed. Never take medication more frequently than prescribed. Taking less pills or less frequently is permitted and encouraged, when it comes to controlled substances (written prescriptions).  6. Inform other Doctors: Always inform, all of your healthcare providers, of all the medications you take. 7. Pain Medication from other Providers: You are not allowed to accept any additional pain medication from any other Doctor or Healthcare provider. There are two exceptions to this rule. (see below) In the event that you require additional pain medication, you are responsible for notifying us, as stated below. 8. Medication Agreement: You are responsible for carefully reading and following our Medication Agreement. This must be signed before receiving any prescriptions from our practice. Safely store a copy of your signed Agreement. Violations to the Agreement will result in no further prescriptions. (Additional copies of our Medication Agreement are available upon request.) 9. Laws, Rules, & Regulations: All patients are expected to follow all Federal and [employer], Rules, [employer]. Ignorance of the Laws does not constitute a valid excuse. The use of any illegal substances is prohibited. 10. Adopted CDC guidelines & recommendations: Target dosing levels will be at or below 60 MME/day. Use of benzodiazepines** is not recommended.  Exceptions: There are only two exceptions to the rule of not receiving pain medications  from other Healthcare Providers. 1. Exception #1 (Emergencies): In the event of an emergency (i.e.: accident requiring emergency care), you are allowed to receive  additional pain medication. However, you are responsible for: As soon as you are able, call our office [phone], at any time of the day or night, and leave a message stating your name, the date and nature of the emergency, and the name and dose of the medication prescribed. In the event that your call is answered by a member of our staff, make sure to document and save the date, time, and the name of the person that took your information.  2. Exception #2 (Planned Surgery): In the event that you are scheduled by another doctor or dentist to have any type of surgery or procedure, you are allowed (for a period no longer than 30 days), to receive additional pain medication, for the acute post-op pain. However, in this case, you are responsible for picking up a copy of our "Post-op Pain Management for Surgeons" handout, and giving it to your surgeon or dentist. This document is available at our office, and does not require an appointment to obtain it. Simply go to our office during business hours (Monday-Thursday from 8:00 AM to 4:00 PM) (Friday 8:00 AM to 12:00 Noon) or if you have a scheduled appointment with [place], prior to your surgery, and ask for it by name. In addition, you will need to provide [place] with your name, name of your surgeon, type of surgery, and date of procedure or surgery.  *Opioid medications include: morphine, codeine, oxycodone, oxymorphone, hydrocodone, hydromorphone, meperidine, tramadol, tapentadol, buprenorphine, fentanyl, methadone. **Benzodiazepine medications include: diazepam (Valium), alprazolam (Xanax), clonazepam (Klonopine), lorazepam (Ativan), clorazepate (Tranxene), chlordiazepoxide (Librium), estazolam (Prosom), oxazepam (Serax), temazepam (Restoril), triazolam (Halcion) (Last updated: 09/15/2017) ____________________________________________________________________________________________

## 2017-12-26 NOTE — Progress Notes (Signed)
Patient ID: Erica [name], female    DOB: 13-Feb-1966, 52 y.o.   MRN: [id number]  HPI  Erica [name] is a 52 y/o female with a history of obstructive sleep apnea, HTN, hyperlipidemia, COPD, asthma, pulmonary HTN, chronic tobacco use and chronic heart failure.  Last echo was done on 06/14/15 and showed an EF of 60-65% with moderately elevated PA pressure at 56 mm Hg. Last PFT's were done 04/19/16. Recently had a chest CT done on 04/27/16 due to ground glass infiltrate.   Was in ED 12/25/17 due to right shoulder pain. She was evaluated and released with her right arm in a sling due to "torn muscle".   She presents today for a follow-up visit with a chief complaint of moderate fatigue upon minimal exertion. She describes this as chronic in nature having been present for several years. She also endorses intermittent chest tightness as well as feeling like her "heart is skipping a beat". She has associated cough, shortness of breath, dizziness, weakness, and chronic difficulty sleeping. She denies any abdominal distention, or pedal edema.   Past Medical History:  Diagnosis Date  . Arthritis   . Asthma   . CHF (congestive heart failure) (HCC)   . COPD (chronic obstructive pulmonary disease) (HCC)   . Gastric ulcer   . Hyperlipidemia   . Hypertension   . OSA on CPAP no ins - no cpap machine for the last 4 years   Past Surgical History:  Procedure Laterality Date  . FLEXIBLE BRONCHOSCOPY N/A 06/20/2015   Procedure: FLEXIBLE BRONCHOSCOPY;  Surgeon: Shane Crutch, MD;  Location: [facility] OR[facility];  Service: Pulmonary;  Laterality: N/A;  . KNEE SURGERY Left    8 knee surgeries   Family History  Problem Relation Age of Onset  . Lung cancer Father   . Breast cancer Mother        early 41's  . Diabetes Maternal Grandmother    Social History  Substance Use Topics  . Smoking status: Former Smoker    Packs/day: 1.00    Years: 36.00    Types: Cigarettes    Quit date: 06/13/2015  . Smokeless tobacco:  Never Used  . Alcohol use 0.0 oz/week     Comment: occasionaly   Allergies  Allergen Reactions  . Tramadol Other (See Comments)    Heart palpatations  . Penicillins Rash   Prior to Admission medications   Medication Sig Start Date End Date Taking? Authorizing Provider  albuterol (PROAIR HFA) 108 (90 Base) MCG/ACT inhaler Inhale into the lungs 4 (four) times daily.   Yes [provider]  aspirin EC 81 MG EC tablet Take 1 tablet (81 mg total) by mouth daily. 06/23/15  Yes Shaune Pollack, MD  atorvastatin (LIPITOR) 80 MG tablet Take 80 mg by mouth daily.   Yes [provider]  Cholecalciferol (VITAMIN D3) 2000 units capsule Take 1 capsule (2,000 Units total) by mouth daily. 09/14/17  Yes Barbette Merino, NP  diltiazem (CARDIZEM CD) 120 MG 24 hr capsule Take 1 capsule (120 mg total) by mouth daily. 12/23/16  Yes Gollan, Tollie Pizza, MD  diltiazem (CARDIZEM) 30 MG tablet Take 1 tablet (30 mg total) by mouth 3 (three) times daily as needed. 03/23/17  Yes Gollan, Tollie Pizza, MD  DULoxetine (CYMBALTA) 30 MG capsule Take 30 mg by mouth.   Yes [provider]  DULoxetine (CYMBALTA) 60 MG capsule Take 60 mg by mouth daily.   Yes [provider]  Fluticasone-Salmeterol (ADVAIR DISKUS IN) Inhale  into the lungs 2 (two) times daily.   Yes [provider]  folic acid (FOLVITE) 1 MG tablet Take 1 mg by mouth daily.   Yes [provider]  furosemide (LASIX) 40 MG tablet Take 40 mg by mouth 2 (two) times daily. Take an additional 40 mg in the am for weight gain as needed   Yes [provider]  losartan (COZAAR) 100 MG tablet Take 1/2 tablet (50 mg) by mouth twice daily   Yes [provider]  Magnesium Oxide, Antacid, 500 MG CAPS once daily. 11/08/16  Yes [provider]  metFORMIN (GLUCOPHAGE) 500 MG tablet Take 500 mg by mouth 2 (two) times daily with a meal.   Yes [provider]  oxyCODONE (OXY IR/ROXICODONE) 5 MG immediate release  tablet Take 1 tablet (5 mg total) by mouth every 8 (eight) hours as needed for severe pain. 09/14/17 10/14/17 Yes Barbette MerinoKing, Crystal M, NP  OXYGEN Inhale into the lungs. 2 liters at bedtime   Yes [provider]  pantoprazole (PROTONIX) 40 MG tablet Take 1 tablet (40 mg total) by mouth daily. 06/23/15  Yes Shaune Pollackhen, Qing, MD  potassium chloride SA (K-DUR,KLOR-CON) 20 MEQ tablet Take 20 mEq by mouth 2 (two) times daily.   Yes [provider]  pregabalin (LYRICA) 75 MG capsule Take 1 capsule (75 mg total) by mouth 3 (three) times daily. 09/14/17 10/14/17 Yes King, Shana Chuterystal M, NP  sucralfate (CARAFATE) 1 G tablet Take 1 tablet (1 g total) by mouth 4 (four) times daily -  with meals and at bedtime. Patient taking differently: Take 1 g by mouth 2 (two) times daily.  06/23/15  Yes Shaune Pollackhen, Qing, MD  tiotropium (SPIRIVA HANDIHALER) 18 MCG inhalation capsule Place 18 mcg into inhaler and inhale daily.   Yes [provider]  tiZANidine (ZANAFLEX) 4 MG capsule Take 1 capsule (4 mg total) by mouth 3 (three) times daily as needed for muscle spasms. 09/14/17 10/14/17 Yes Barbette MerinoKing, Crystal M, NP  oxyCODONE (OXY IR/ROXICODONE) 5 MG immediate release tablet Take 1 tablet (5 mg total) by mouth every 8 (eight) hours as needed for severe pain. 05/18/17 06/17/17  Delano MetzNaveira, Francisco, MD  oxyCODONE (OXY IR/ROXICODONE) 5 MG immediate release tablet Take 1 tablet (5 mg total) by mouth every 8 (eight) hours as needed for severe pain. 06/17/17 07/17/17  Delano MetzNaveira, Francisco, MD   Review of Systems  Constitutional: Negative for appetite change and fever.  HENT: Positive for congestion and sinus pressure. Negative for postnasal drip.   Eyes: Positive for visual disturbance (blurry vision).  Respiratory: Positive for cough and shortness of breath. Negative for chest tightness.   Cardiovascular: Positive for chest pain (at times) and palpitations (at times). Negative for leg swelling.  Gastrointestinal: Negative for abdominal  distention and abdominal pain.  Endocrine: Negative.   Genitourinary: Negative.   Musculoskeletal: Positive for arthralgias (right shoulder) and back pain (low back pain). Negative for neck stiffness.  Skin: Negative.   Allergic/Immunologic: Negative.   Neurological: Positive for dizziness (with position changes) and weakness ("off-balance at times"). Negative for light-headedness and headaches.  Hematological: Negative for adenopathy. Does not bruise/bleed easily.  Psychiatric/Behavioral: Positive for sleep disturbance (wearing oxygen at bedtime at 2L and PRN during the day). Negative for dysphoric mood. The patient is not nervous/anxious.    Vitals:   12/26/17 1403  BP: (!) 159/77  Pulse: 82  Resp: 18  SpO2: 97%  Weight: 205 lb (93 kg)  Height: 5\' 8"  (1.727 m)  Wt Readings from Last 3 Encounters:  12/26/17 205 lb (93 kg)  12/26/17 200 lb (90.7 kg)  12/25/17 200 lb (90.7 kg)   Lab Results  Component Value Date   CREATININE 0.87 09/22/2017   CREATININE 0.85 04/08/2017   CREATININE 0.95 06/14/2016   Physical Exam  Constitutional: She is oriented to person, place, and time. She appears well-developed and well-nourished.  HENT:  Head: Normocephalic and atraumatic.  Neck: Normal range of motion. Neck supple. No JVD present.  Cardiovascular: Normal rate and regular rhythm.  Pulmonary/Chest: Effort normal. She has no wheezes. She has no rales.  Abdominal: Soft. She exhibits no distension. There is no tenderness.  Musculoskeletal: She exhibits tenderness (right shoulder pain). She exhibits no edema.  Neurological: She is alert and oriented to person, place, and time.  Skin: Skin is warm and dry.  Psychiatric: She has a normal mood and affect. Her behavior is normal. Thought content normal.  Nursing note and vitals reviewed.    Assessment & Plan:  1: Chronic heart failure with preserved ejection fraction- - NYHA class III - euvolemic - weighing daily; reminded to call for an  overnight weight gain of >2 pounds or a weekly weight gain of >5 pounds - weight stable from last time she was here - not adding any salt to her food - has been trying to drink more water but still occasionally drinks pepsi - Unable to wear CPAP as it causes panic attacks. Does wear oxygen at 2L at bedtime as well as when she is up and about - saw cardiology Brion Aliment) 09/08/17 & losartan increased to 50mg  BID - BMP 09/22/17 showed Na 138, K 3.9, GFR > 60, Scr 0.87 - BP and HR were a little high today, but she states that she has been out of her medications for 2 days; will re-evaluate at next appointment - will schedule an ECHO sometime in the next few weeks  2: Pulmonary HTN- - Saw pulmonologist Meredeth Ide) 09/28/17 - Wearing oxygent at 2L at bedtime and when active  3: Diabetes- - sees Dr. Allena Katz at [facility] - still doesn't have a glucometer; is planning on getting RX from her PCP - has heard from [facility] but has to postpone due to transportation issues  4: HTN- - Currently taking Cardizem CD 120mg  daily with an additional Cardizem 30mg  prn.  - still checking her BP at home; states that it runs in 130-140's systolic - BMP 09/22/17 showed Na 138, K 3.9, GFR > 60, Scr 0.87   Patient did not bring her medications nor a list. Each medication was verbally reviewed with the patient and she was encouraged to bring the bottles to every visit to confirm accuracy of list.  Return on 03/27/18 or sooner for any questions/problems before then.   Yolanda Bonine, PharmD Pharmacy Resident

## 2017-12-26 NOTE — Patient Instructions (Signed)
____________________________________________________________________________________________  Medication Rules  Applies to: All patients receiving prescriptions (written or electronic).  Pharmacy of record: Pharmacy where electronic prescriptions will be sent. If written prescriptions are taken to a different pharmacy, please inform the nursing staff. The pharmacy listed in the electronic medical record should be the one where you would like electronic prescriptions to be sent.  Prescription refills: Only during scheduled appointments. Applies to both, written and electronic prescriptions.  NOTE: The following applies primarily to controlled substances (Opioid* Pain Medications).   Patient's responsibilities: 1. Pain Pills: Bring all pain pills to every appointment (except for procedure appointments). 2. Pill Bottles: Bring pills in original pharmacy bottle. Always bring newest bottle. Bring bottle, even if empty. 3. Medication refills: You are responsible for knowing and keeping track of what medications you need refilled. The day before your appointment, write a list of all prescriptions that need to be refilled. Bring that list to your appointment and give it to the admitting nurse. Prescriptions will be written only during appointments. If you forget a medication, it will not be "Called in", "Faxed", or "electronically sent". You will need to get another appointment to get these prescribed. 4. Prescription Accuracy: You are responsible for carefully inspecting your prescriptions before leaving our office. Have the discharge nurse carefully go over each prescription with you, before taking them home. Make sure that your name is accurately spelled, that your address is correct. Check the name and dose of your medication to make sure it is accurate. Check the number of pills, and the written instructions to make sure they are clear and accurate. Make sure that you are given enough medication to last  until your next medication refill appointment. 5. Taking Medication: Take medication as prescribed. Never take more pills than instructed. Never take medication more frequently than prescribed. Taking less pills or less frequently is permitted and encouraged, when it comes to controlled substances (written prescriptions).  6. Inform other Doctors: Always inform, all of your healthcare providers, of all the medications you take. 7. Pain Medication from other Providers: You are not allowed to accept any additional pain medication from any other Doctor or Healthcare provider. There are two exceptions to this rule. (see below) In the event that you require additional pain medication, you are responsible for notifying us, as stated below. 8. Medication Agreement: You are responsible for carefully reading and following our Medication Agreement. This must be signed before receiving any prescriptions from our practice. Safely store a copy of your signed Agreement. Violations to the Agreement will result in no further prescriptions. (Additional copies of our Medication Agreement are available upon request.) 9. Laws, Rules, & Regulations: All patients are expected to follow all Federal and State Laws, Statutes, Rules, & Regulations. Ignorance of the Laws does not constitute a valid excuse. The use of any illegal substances is prohibited. 10. Adopted CDC guidelines & recommendations: Target dosing levels will be at or below 60 MME/day. Use of benzodiazepines** is not recommended.  Exceptions: There are only two exceptions to the rule of not receiving pain medications from other Healthcare Providers. 1. Exception #1 (Emergencies): In the event of an emergency (i.e.: accident requiring emergency care), you are allowed to receive additional pain medication. However, you are responsible for: As soon as you are able, call our office [phone], at any time of the day or night, and leave a message stating your name, the  date and nature of the emergency, and the name and dose of the medication   prescribed. In the event that your call is answered by a member of our staff, make sure to document and save the date, time, and the name of the person that took your information.  2. Exception #2 (Planned Surgery): In the event that you are scheduled by another doctor or dentist to have any type of surgery or procedure, you are allowed (for a period no longer than 30 days), to receive additional pain medication, for the acute post-op pain. However, in this case, you are responsible for picking up a copy of our "Post-op Pain Management for Surgeons" handout, and giving it to your surgeon or dentist. This document is available at our office, and does not require an appointment to obtain it. Simply go to our office during business hours (Monday-Thursday from 8:00 AM to 4:00 PM) (Friday 8:00 AM to 12:00 Noon) or if you have a scheduled appointment with us, prior to your surgery, and ask for it by name. In addition, you will need to provide us with your name, name of your surgeon, type of surgery, and date of procedure or surgery.  *Opioid medications include: morphine, codeine, oxycodone, oxymorphone, hydrocodone, hydromorphone, meperidine, tramadol, tapentadol, buprenorphine, fentanyl, methadone. **Benzodiazepine medications include: diazepam (Valium), alprazolam (Xanax), clonazepam (Klonopine), lorazepam (Ativan), clorazepate (Tranxene), chlordiazepoxide (Librium), estazolam (Prosom), oxazepam (Serax), temazepam (Restoril), triazolam (Halcion) (Last updated: 09/15/2017) ____________________________________________________________________________________________    

## 2017-12-26 NOTE — Patient Instructions (Signed)
Continue weighing daily and call for an overnight weight gain of > 2 pounds or a weekly weight gain of >5 pounds. 

## 2018-02-06 ENCOUNTER — Emergency Department
Admission: EM | Admit: 2018-02-06 | Discharge: 2018-02-06 | Disposition: A | Discharge status: Home / Self Care | Payer: Medicaid Other | Attending: Emergency Medicine | Admitting: Emergency Medicine

## 2018-02-06 ENCOUNTER — Other Ambulatory Visit: Payer: Self-pay

## 2018-02-06 ENCOUNTER — Emergency Department: Payer: Medicaid Other

## 2018-02-06 ENCOUNTER — Encounter: Payer: Self-pay | Admitting: Emergency Medicine

## 2018-02-06 DIAGNOSIS — G510 Bell's palsy: Secondary | ICD-10-CM | POA: Diagnosis not present

## 2018-02-06 DIAGNOSIS — Z79899 Other long term (current) drug therapy: Secondary | ICD-10-CM | POA: Insufficient documentation

## 2018-02-06 DIAGNOSIS — E119 Type 2 diabetes mellitus without complications: Secondary | ICD-10-CM | POA: Insufficient documentation

## 2018-02-06 DIAGNOSIS — R29810 Facial weakness: Secondary | ICD-10-CM | POA: Diagnosis present

## 2018-02-06 DIAGNOSIS — Z87891 Personal history of nicotine dependence: Secondary | ICD-10-CM | POA: Diagnosis not present

## 2018-02-06 DIAGNOSIS — I5032 Chronic diastolic (congestive) heart failure: Secondary | ICD-10-CM | POA: Insufficient documentation

## 2018-02-06 DIAGNOSIS — J449 Chronic obstructive pulmonary disease, unspecified: Secondary | ICD-10-CM | POA: Insufficient documentation

## 2018-02-06 DIAGNOSIS — I110 Hypertensive heart disease with heart failure: Secondary | ICD-10-CM | POA: Insufficient documentation

## 2018-02-06 DIAGNOSIS — Z7982 Long term (current) use of aspirin: Secondary | ICD-10-CM | POA: Insufficient documentation

## 2018-02-06 DIAGNOSIS — Z7984 Long term (current) use of oral hypoglycemic drugs: Secondary | ICD-10-CM | POA: Insufficient documentation

## 2018-02-06 LAB — COMPREHENSIVE METABOLIC PANEL
ALBUMIN: 3.7 g/dL (ref 3.5–5.0)
ALT: 15 U/L (ref 0–44)
AST: 24 U/L (ref 15–41)
Alkaline Phosphatase: 72 U/L (ref 38–126)
Anion gap: 5 (ref 5–15)
BUN: 8 mg/dL (ref 6–20)
CHLORIDE: 103 mmol/L (ref 98–111)
CO2: 30 mmol/L (ref 22–32)
Calcium: 9.3 mg/dL (ref 8.9–10.3)
Creatinine, Ser: 0.73 mg/dL (ref 0.44–1.00)
GFR calc non Af Amer: >60 mL/min (ref >60)
Glucose, Bld: 139 mg/dL — ABNORMAL HIGH (ref 70–99)
Potassium: 4.6 mmol/L (ref 3.5–5.1)
SODIUM: 138 mmol/L (ref 135–145)
Total Bilirubin: 0.7 mg/dL (ref 0.3–1.2)
Total Protein: 7 g/dL (ref 6.5–8.1)

## 2018-02-06 LAB — CBC
HCT: 34.7 % — ABNORMAL LOW (ref 35.0–47.0)
Hemoglobin: 11.5 g/dL — ABNORMAL LOW (ref 12.0–16.0)
MCH: 27.6 pg (ref 26.0–34.0)
MCHC: 33.2 g/dL (ref 32.0–36.0)
MCV: 83.2 fL (ref 80.0–100.0)
PLATELETS: 251 10*3/uL (ref 150–440)
RBC: 4.17 MIL/uL (ref 3.80–5.20)
RDW: 18.7 % — AB (ref 11.5–14.5)
WBC: 8.8 10*3/uL (ref 3.6–11.0)

## 2018-02-06 LAB — DIFFERENTIAL
BASOS ABS: 0 10*3/uL (ref 0–0.1)
BASOS PCT: 0 %
Eosinophils Absolute: 0.4 10*3/uL (ref 0–0.7)
Eosinophils Relative: 4 %
LYMPHS PCT: 27 %
Lymphs Abs: 2.4 10*3/uL (ref 1.0–3.6)
MONO ABS: 0.7 10*3/uL (ref 0.2–0.9)
Monocytes Relative: 8 %
NEUTROS ABS: 5.4 10*3/uL (ref 1.4–6.5)
NEUTROS PCT: 61 %

## 2018-02-06 LAB — PROTIME-INR
INR: 0.96
PROTHROMBIN TIME: 12.7 s (ref 11.4–15.2)

## 2018-02-06 LAB — APTT: APTT: 35 s (ref 24–36)

## 2018-02-06 LAB — TROPONIN I: Troponin I: <0.03 ng/mL (ref <0.03)

## 2018-02-06 MED ORDER — PREDNISONE 10 MG PO TABS: 60.0000 mg | ORAL_TABLET | Freq: Every day | ORAL | 0 refills | Status: DC

## 2018-02-06 MED ORDER — VALACYCLOVIR HCL 1 G PO TABS: 1000.0000 mg | ORAL_TABLET | Freq: Three times a day (TID) | ORAL | 0 refills | Status: DC

## 2018-02-06 NOTE — ED Notes (Signed)
Pt present with facial droop following bad headache on Saturday night. She awakened on Sunday with no headache, slept a lot yesterday; then she had trouble eating and drinking in the afternoon (could not swallow well).; realized she had the droop yesterday.  Pt has noticeable droop on the left side of her face, including eye and mouth. Pt states she feels more tired than usual. Pt did not come to the hospital because she did not have transportation, had to borrow money to get a ride. PT ambulatory with no speech difficulties.

## 2018-02-06 NOTE — Discharge Instructions (Signed)
Please see your doctor next week or sooner for concerns. Return to the ER for any symptom that changes or worsens if you are unable to see your primary care provider right away.  Place GenTeal lubricating ointment in your eyes a few times per day especially before bed. You may also use contact lens drops in your eye during the day as well.

## 2018-02-06 NOTE — ED Notes (Signed)
Pt discharged home after verbalizing understanding of discharge instructions; nad noted. 

## 2018-02-06 NOTE — ED Provider Notes (Signed)
[facility] Emergency Department Provider Note  ___________________________________________   None    (approximate)  I have reviewed the triage vital signs and the nursing notes.   HISTORY  Chief Complaint Cerebrovascular Accident  HPI Erica [name] is a 52 y.o. female who presents to the emergency department for treatment and evaluation of possible CVA. She believes her symptoms started Saturday sometime. Saturday night she had a headache that had resolved this morning. She reports sleeping "a lot" yesterday but contributed that to her daily medications. Today, she has noticed that her face is drooping and her left eye isn't closing all the way when she blinks. She reports that her food has lost taste as well. She has a significant past medical history of CHF, DM II, COPD, and Hypertension. She was unable to come in for treatment at time of onset due to lack of transportation.  Past Medical History:  Diagnosis Date  . (HFpEF) heart failure with preserved ejection fraction ([place])    a. 05/2015 Echo: EF 60-65%, no rwma, PASP 50mHg.  . Arthritis   . Asthma   . CHF (congestive heart failure) ([place]   . COPD (chronic obstructive pulmonary disease) ([place]   . Diabetes mellitus without complication ([place]    type II 03/2017  . Gastric ulcer   . Hyperlipidemia   . Hypertension   . OSA (obstructive sleep apnea)    a. did not tolerate CPAP.  .Marland KitchenShoulder injury    6/19    Patient Active Problem List   Diagnosis Date Noted  . Obesity (BMI 30.0-34.9) 12/05/2017  . Primary localized osteoarthrosis, pelvic region and thigh 12/05/2017  . Arthropathy of left hip 11/08/2017  . Rib pain on right side 11/08/2017  . Spondylosis without myelopathy or radiculopathy, lumbosacral region 10/06/2017  . Vertigo 09/15/2017  . Diabetes ([place] 07/18/2017  . HTN (hypertension) 07/18/2017  . Chronic hip pain (Secondary source of pain) (Bilateral) (L>R) 05/02/2017  .  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 Hemarthrosis, left knee 11/10/2016  . Osteoarthritis of knee (Bilateral) (L>R) 09/08/2016  . Intermittent left thoracic Muscle cramps 09/08/2016  . Spasm of thoracolumbar muscle (Left) 09/08/2016  . Post-traumatic osteoarthritis of knee (Left) 09/08/2016  . Lumbar spondylosis 07/05/2016  . Chronic pain syndrome 06/28/2016  . Elevated sedimentation rate 06/28/2016  . Elevated C-reactive protein (CRP) 06/28/2016  . Neurogenic pain 06/28/2016  . Vitamin D deficiency 06/28/2016  . Long term current use of opiate analgesic 04/29/2016  . Long term prescription opiate use 04/29/2016  . Opiate use 04/29/2016  . Chronic low back pain (Primary Source of Pain) (Bilateral) (L>R) 04/29/2016  . [facility] ([facility]) (L>R) 04/29/2016  . Chronic neck pain (Bilateral) (R>L) 04/29/2016  . Chronic upper back pain (midline) 04/29/2016  . Chronic foot pain (bottom of feet) (Bilateral) (R>L) 04/29/2016  . Chronic hand pain (Bilateral) 04/29/2016  . Peripheral neuropathy, idiopathic (upper and lower extremity) 04/29/2016  . Chronic sacroiliac joint pain (Bilateral) (R>L) 04/29/2016  . Lumbar facet syndrome (Bilateral) (R>L) 04/29/2016  . Depression 03/05/2016  . Chronic diastolic heart failure ([place] 07/16/2015  . Smoker 07/16/2015  . Obstructive sleep apnea 07/16/2015  . Tachycardia 07/16/2015  . COPD, mild ([place]   . Renal insufficiency   . Pulmonary HTN ([place]   . Chest pain 06/14/2015  . Hyponatremia 06/14/2015  . Shortness of breath   . Swelling   . Cough   . COPD (chronic obstructive pulmonary disease) with  acute bronchitis [facility])     Past Surgical History:  Procedure Laterality Date  . FLEXIBLE BRONCHOSCOPY N/A 06/20/2015   Procedure: FLEXIBLE BRONCHOSCOPY;  Surgeon: Laverle Hobby, MD;  Location: [facility] ORS;  Service: Pulmonary;  Laterality: N/A;  . KNEE SURGERY Left    8 knee surgeries    Prior to  Admission medications   Medication Sig Start Date End Date Taking? Authorizing Provider  albuterol (PROAIR HFA) 108 (90 Base) MCG/ACT inhaler Inhale into the lungs 4 (four) times daily.    [provider]  aspirin EC 81 MG EC tablet Take 1 tablet (81 mg total) by mouth daily. 06/23/15   Demetrios Loll, MD  atorvastatin (LIPITOR) 80 MG tablet Take 80 mg by mouth daily.    [provider]  Blood Glucose Monitoring Suppl (ACCU-CHEK AVIVA PLUS) w/Device KIT See admin instructions. 10/20/17   [provider]  CARTIA XT 120 MG 24 hr capsule take 1 capsule by mouth once daily 12/26/17   Minna Merritts, MD  Cholecalciferol (VITAMIN D3) 2000 units capsule Take 1 capsule (2,000 Units total) by mouth daily. 09/14/17   Vevelyn Francois, NP  diltiazem (CARDIZEM) 30 MG tablet Take 1 tablet (30 mg total) by mouth 3 (three) times daily as needed. 03/23/17   Minna Merritts, MD  DULoxetine (CYMBALTA) 30 MG capsule Take 30 mg by mouth.    [provider]  DULoxetine (CYMBALTA) 60 MG capsule Take 60 mg by mouth daily.    [provider]  fluconazole (DIFLUCAN) 150 MG tablet TAKE 1 TABLET NOW FOR YEAST INFECTION, MAY REPEAT IN 3 DAYS IF SYMPTOMS PERSIST 10/17/17   [provider]  Fluticasone-Salmeterol (ADVAIR DISKUS IN) Inhale into the lungs 2 (two) times daily.    [provider]  folic acid (FOLVITE) 1 MG tablet Take 1 mg by mouth daily.    [provider]  furosemide (LASIX) 40 MG tablet Take 40 mg by mouth daily. Take an additional 40 mg in the am for weight gain as needed    [provider]  HYDROcodone-acetaminophen (NORCO/VICODIN) 5-325 MG tablet Take 1 tablet by mouth every 6 (six) hours as needed for moderate pain. 12/25/17   Johnn Hai, PA-C  losartan (COZAAR) 100 MG tablet Take 1/2 tablet (50 mg) by mouth twice daily    [provider]  Magnesium Oxide, Antacid, 500 MG CAPS once daily. 11/08/16   [provider]    metFORMIN (GLUCOPHAGE) 500 MG tablet Take 500 mg by mouth 2 (two) times daily with a meal.    [provider]  metFORMIN (GLUCOPHAGE-XR) 500 MG 24 hr tablet Take 1,000 mg by mouth daily. 01/26/18   [provider]  oxyCODONE (OXY IR/ROXICODONE) 5 MG immediate release tablet Take 1 tablet (5 mg total) by mouth every 8 (eight) hours as needed for severe pain. Patient taking differently: Take 5 mg by mouth every 8 (eight) hours as needed for severe pain. Patient is taking 3 times daily scheduled 01/25/18 02/24/18  Vevelyn Francois, NP  OXYGEN Inhale into the lungs. 2 liters at bedtime    [provider]  pantoprazole (PROTONIX) 40 MG tablet Take 1 tablet (40 mg total) by mouth daily. 06/23/15   Demetrios Loll, MD  potassium chloride SA (K-DUR,KLOR-CON) 20 MEQ tablet Take 20 mEq by mouth 2 (two) times daily.    [provider]  predniSONE (DELTASONE) 10 MG tablet Take 6 tablets (60 mg total) by mouth daily. 02/06/18   Kilie Rund,  Feliberto Stockley B, FNP  pregabalin (LYRICA) 75 MG capsule Take 1 capsule (75 mg total) by mouth 3 (three) times daily. 12/26/17 02/24/18  Vevelyn Francois, NP  RA MAGNESIUM 500 MG CAPS Take 1 capsule by mouth 2 (two) times daily. 01/24/18   [provider]  sucralfate (CARAFATE) 1 G tablet Take 1 tablet (1 g total) by mouth 4 (four) times daily -  with meals and at bedtime. Patient taking differently: Take 1 g by mouth 2 (two) times daily.  06/23/15   Demetrios Loll, MD  tiotropium (SPIRIVA HANDIHALER) 18 MCG inhalation capsule Place 18 mcg into inhaler and inhale daily.    [provider]  tiZANidine (ZANAFLEX) 4 MG capsule Take 1 capsule (4 mg total) by mouth 3 (three) times daily as needed for muscle spasms. 12/26/17 01/25/18  Vevelyn Francois, NP  tiZANidine (ZANAFLEX) 4 MG capsule Take 1 capsule by mouth 3 (three) times daily as needed. 01/26/18   [provider]  valACYclovir (VALTREX) 1000 MG tablet Take 1 tablet (1,000 mg total) by mouth 3  (three) times daily. 02/06/18   Lorenzo Arscott, Johnette Abraham B, FNP    Allergies Tramadol and Penicillins  Family History  Problem Relation Age of Onset  . Lung cancer Father   . Breast cancer Mother        early 66's  . Diabetes Maternal Grandmother     Social History Social History   Tobacco Use  . Smoking status: Former Smoker    Packs/day: 1.00    Years: 36.00    Pack years: 36.00    Types: Cigarettes    Last attempt to quit: 06/13/2015    Years since quitting: 2.6  . Smokeless tobacco: Never Used  Substance Use Topics  . Alcohol use: No    Alcohol/week: 0.0 oz    Frequency: Never    Comment: occasionaly  . Drug use: No    Review of Systems  Constitutional: No fever/chills Eyes: No visual changes. ENT: No sore throat. Positive for change in ability to taste. Cardiovascular: Denies chest pain. Respiratory: Denies shortness of breath. Gastrointestinal: No abdominal pain.  No nausea, no vomiting.  No diarrhea.  No constipation. Genitourinary: Negative for dysuria. Musculoskeletal: Negative for back pain. Skin: Negative for rash. Neurological: Negative for headaches, focal weakness or numbness. ____________________________________________   PHYSICAL EXAM:  VITAL SIGNS: ED Triage Vitals [02/06/18 1725]  Enc Vitals Group     BP 133/67     Pulse Rate 82     Resp 18     Temp 98.2 F (36.8 C)     Temp Source Oral     SpO2 96 %     Weight 200 lb (90.7 kg)     Height _0  (1.727 m)     Head Circumference      Peak Flow      Pain Score 0     Pain Loc      Pain Edu?      Excl. in [place]?     Constitutional: Alert and oriented. Well appearing and in no acute distress. Eyes: Conjunctivae are normal. PERRL. EOMI. No nystagmus. Head: Atraumatic. Nose: No congestion/rhinnorhea. Mouth/Throat: Mucous membranes are moist.  Oropharynx non-erythematous. Swallowing without complication. Neck: No stridor.   Cardiovascular: Normal rate, regular rhythm. Grossly normal heart  sounds.  Good peripheral circulation. Respiratory: Normal respiratory effort.  No retractions. Lungs diminished, but CTAB. Gastrointestinal: Soft and nontender. No distention. No abdominal bruits. No CVA tenderness. Musculoskeletal: No lower extremity tenderness  nor edema.  No joint effusions. Neurologic:  Normal speech and language. No gross focal neurologic deficits are appreciated. No gait instability. Left side facial droop, left eyelid with weak blink, forehead raise weaker on the left side. No extremity weakness. Coordinated. No arm/leg droop. Skin:  Skin is warm, dry and intact. No rash noted. Psychiatric: Mood and affect are normal. Speech and behavior are normal.  ____________________________________________   LABS (all labs ordered are listed, but only abnormal results are displayed)  Labs Reviewed  CBC - Abnormal; Notable for the following components:      Result Value   Hemoglobin 11.5 (*)    HCT 34.7 (*)    RDW 18.7 (*)    All other components within normal limits  COMPREHENSIVE METABOLIC PANEL - Abnormal; Notable for the following components:   Glucose, Bld 139 (*)    All other components within normal limits  PROTIME-INR  APTT  DIFFERENTIAL  TROPONIN I   ____________________________________________  EKG  ED ECG REPORT I, Sherrie George, the attending physician, personally viewed and interpreted this ECG.  Date: 02/06/2018 EKG Time: 1731 Rate: 65 Rhythm: normal sinus rhythm QRS Axis: normal Intervals: normal ST/T Wave abnormalities: none Narrative Interpretation: no evidence of acute ischemia  ____________________________________________  RADIOLOGY  ED MD interpretation:  No acute findings per radiology  Official radiology report(s): Ct Head Wo Contrast  Result Date: 02/06/2018 CLINICAL DATA:  Pt reports noticing left side facial paralysis and numbness to left arm "yesterday evening." Pt unable to provide definite last known well time EXAM: CT HEAD  WITHOUT CONTRAST TECHNIQUE: Contiguous axial images were obtained from the base of the skull through the vertex without intravenous contrast. COMPARISON:  None. FINDINGS: Brain: Mild atrophy. No evidence of acute infarction, hemorrhage, hydrocephalus, extra-axial collection or mass lesion/mass effect. Vascular: No hyperdense vessel or unexpected calcification. Skull: Normal. Negative for fracture or focal lesion. Sinuses/Orbits: No acute finding. Other: None. IMPRESSION: 1. Mild atrophy.  No acute findings. Electronically Signed   By: Lucrezia Europe M.D.   On: 02/06/2018 18:23    ____________________________________________   PROCEDURES  Procedure(s) performed: None  Procedures  Critical Care performed: No  ____________________________________________   INITIAL IMPRESSION / ASSESSMENT AND PLAN / ED COURSE  As part of my medical decision making, I reviewed the following data within the electronic MEDICAL RECORD NUMBER Notes from prior ED visits  Differential diagnosis includes but is not limited to CVA, TIA, or Bells Palsy. Based on initial exam, Bells Palsy is the most likely. Waiting on CT results.   CT is reassuring. Patient able to walk with steady gait unassisted. She will be treated with prednisone and valacyclovir. She is to see her PCP in 1 week or sooner for concerns. She is to return to the ER for symptoms of concerns if unable to schedule an appointment.      ____________________________________________   FINAL CLINICAL IMPRESSION(S) / ED DIAGNOSES  Final diagnoses:  Bell's palsy     ED Discharge Orders        Ordered    predniSONE (DELTASONE) 10 MG tablet  Daily     02/06/18 1846    valACYclovir (VALTREX) 1000 MG tablet  3 times daily     02/06/18 1846       Note:  This document was prepared using Dragon voice recognition software and may include unintentional dictation errors.    Victorino Dike, FNP 02/06/18 [id number]    Arta Silence, MD 02/06/18  [id number]

## 2018-02-06 NOTE — ED Triage Notes (Signed)
Patient presents to the ED with difficulty moving her face since yesterday evening.    Patient states she can't control the left side of her face.  Patient reports headache yesterday as well. Patient's grip strength in her left hand is much stronger.  Patient has left sided facial droop and left eyebrow does not raise nearly as high as right eyebrow.  Patient has no arm drift.  Patient is ambulatory.  Patient reports having difficulty blinking her left eye.

## 2018-02-06 NOTE — ED Notes (Signed)
First Nurse Note:  Pt reports noticing left side facial paralysis and numbness to left arm "yesterday evening."  Pt unable to provide definite last known well time.

## 2018-02-27 ENCOUNTER — Other Ambulatory Visit: Payer: Self-pay

## 2018-02-27 ENCOUNTER — Encounter: Payer: Self-pay | Admitting: Nurse Practitioner

## 2018-02-27 ENCOUNTER — Ambulatory Visit: Payer: Medicaid Other | Attending: Nurse Practitioner | Admitting: Nurse Practitioner

## 2018-02-27 ENCOUNTER — Ambulatory Visit: Payer: Medicaid Other | Admitting: Pain Medicine

## 2018-02-27 VITALS — BP 168/96 | HR 89 | Temp 98.2°F | Resp 18 | Ht 68.0 in | Wt 200.0 lb

## 2018-02-27 DIAGNOSIS — M542 Cervicalgia: Secondary | ICD-10-CM | POA: Diagnosis not present

## 2018-02-27 DIAGNOSIS — E559 Vitamin D deficiency, unspecified: Secondary | ICD-10-CM

## 2018-02-27 DIAGNOSIS — M1732 Unilateral post-traumatic osteoarthritis, left knee: Secondary | ICD-10-CM

## 2018-02-27 DIAGNOSIS — M25552 Pain in left hip: Secondary | ICD-10-CM | POA: Insufficient documentation

## 2018-02-27 DIAGNOSIS — Z79891 Long term (current) use of opiate analgesic: Secondary | ICD-10-CM | POA: Diagnosis not present

## 2018-02-27 DIAGNOSIS — G894 Chronic pain syndrome: Secondary | ICD-10-CM | POA: Diagnosis not present

## 2018-02-27 DIAGNOSIS — R000 Tachycardia, unspecified: Secondary | ICD-10-CM | POA: Diagnosis not present

## 2018-02-27 DIAGNOSIS — Z79899 Other long term (current) drug therapy: Secondary | ICD-10-CM | POA: Diagnosis not present

## 2018-02-27 DIAGNOSIS — M792 Neuralgia and neuritis, unspecified: Secondary | ICD-10-CM

## 2018-02-27 DIAGNOSIS — M533 Sacrococcygeal disorders, not elsewhere classified: Secondary | ICD-10-CM | POA: Diagnosis not present

## 2018-02-27 DIAGNOSIS — E1142 Type 2 diabetes mellitus with diabetic polyneuropathy: Secondary | ICD-10-CM | POA: Diagnosis not present

## 2018-02-27 DIAGNOSIS — M1610 Unilateral primary osteoarthritis, unspecified hip: Secondary | ICD-10-CM | POA: Diagnosis not present

## 2018-02-27 DIAGNOSIS — Z6839 Body mass index (BMI) 39.0-39.9, adult: Secondary | ICD-10-CM | POA: Insufficient documentation

## 2018-02-27 DIAGNOSIS — G8929 Other chronic pain: Secondary | ICD-10-CM | POA: Diagnosis present

## 2018-02-27 DIAGNOSIS — I5032 Chronic diastolic (congestive) heart failure: Secondary | ICD-10-CM | POA: Diagnosis not present

## 2018-02-27 DIAGNOSIS — M170 Bilateral primary osteoarthritis of knee: Secondary | ICD-10-CM

## 2018-02-27 DIAGNOSIS — M545 Low back pain: Secondary | ICD-10-CM | POA: Insufficient documentation

## 2018-02-27 DIAGNOSIS — E669 Obesity, unspecified: Secondary | ICD-10-CM | POA: Insufficient documentation

## 2018-02-27 DIAGNOSIS — J449 Chronic obstructive pulmonary disease, unspecified: Secondary | ICD-10-CM | POA: Insufficient documentation

## 2018-02-27 DIAGNOSIS — M160 Bilateral primary osteoarthritis of hip: Secondary | ICD-10-CM

## 2018-02-27 DIAGNOSIS — G4733 Obstructive sleep apnea (adult) (pediatric): Secondary | ICD-10-CM | POA: Insufficient documentation

## 2018-02-27 DIAGNOSIS — M47816 Spondylosis without myelopathy or radiculopathy, lumbar region: Secondary | ICD-10-CM

## 2018-02-27 DIAGNOSIS — G609 Hereditary and idiopathic neuropathy, unspecified: Secondary | ICD-10-CM

## 2018-02-27 DIAGNOSIS — I272 Pulmonary hypertension, unspecified: Secondary | ICD-10-CM | POA: Insufficient documentation

## 2018-02-27 DIAGNOSIS — R252 Cramp and spasm: Secondary | ICD-10-CM

## 2018-02-27 DIAGNOSIS — M62830 Muscle spasm of back: Secondary | ICD-10-CM

## 2018-02-27 MED ORDER — PREGABALIN 75 MG PO CAPS
75.0000 mg | ORAL_CAPSULE | Freq: Three times a day (TID) | ORAL | 1 refills | Status: DC
Start: 2018-02-27 — End: 2018-05-16

## 2018-02-27 MED ORDER — TIZANIDINE HCL 4 MG PO CAPS: 4.0000 mg | ORAL_CAPSULE | Freq: Three times a day (TID) | ORAL | 1 refills | Status: DC | PRN

## 2018-02-27 MED ORDER — OXYCODONE HCL 5 MG PO TABS: 5.0000 mg | ORAL_TABLET | Freq: Three times a day (TID) | ORAL | 0 refills | Status: DC | PRN

## 2018-02-27 MED ORDER — PREGABALIN 75 MG PO CAPS: 75.0000 mg | ORAL_CAPSULE | Freq: Three times a day (TID) | ORAL | 1 refills | Status: DC

## 2018-02-27 MED ORDER — VITAMIN D3 50 MCG (2000 UT) PO CAPS: ORAL_CAPSULE | ORAL | 99 refills | Status: DC

## 2018-02-27 NOTE — Progress Notes (Signed)
Nursing Pain Medication Assessment:  Safety precautions to be maintained throughout the outpatient stay will include: orient to surroundings, keep bed in low position, maintain call bell within reach at all times, provide assistance with transfer out of bed and ambulation.  Medication Inspection Compliance: Pill count conducted under aseptic conditions, in front of the patient. Neither the pills nor the bottle was removed from the patient's sight at any time. Once count was completed pills were immediately returned to the patient in their original bottle.  Medication: Oxycodone IR Pill/Patch Count: 0 of 90 pills remain Pill/Patch Appearance: Markings consistent with prescribed medication Bottle Appearance: Standard pharmacy container. Clearly labeled. Filled Date:07 / 10/ 2019 Last Medication intake:  Day before yesterday

## 2018-02-27 NOTE — Patient Instructions (Addendum)
____________________________________________________________________________________________  Medication Rules  Applies to: All patients receiving prescriptions (written or electronic).  Pharmacy of record: Pharmacy where electronic prescriptions will be sent. If written prescriptions are taken to a different pharmacy, please inform the nursing staff. The pharmacy listed in the electronic medical record should be the one where you would like electronic prescriptions to be sent.  Prescription refills: Only during scheduled appointments. Applies to both, written and electronic prescriptions.  NOTE: The following applies primarily to controlled substances (Opioid* Pain Medications).   Patient's responsibilities: 1. Pain Pills: Bring all pain pills to every appointment (except for procedure appointments). 2. Pill Bottles: Bring pills in original pharmacy bottle. Always bring newest bottle. Bring bottle, even if empty. 3. Medication refills: You are responsible for knowing and keeping track of what medications you need refilled. The day before your appointment, write a list of all prescriptions that need to be refilled. Bring that list to your appointment and give it to the admitting nurse. Prescriptions will be written only during appointments. If you forget a medication, it will not be "Called in", "Faxed", or "electronically sent". You will need to get another appointment to get these prescribed. 4. Prescription Accuracy: You are responsible for carefully inspecting your prescriptions before leaving our office. Have the discharge nurse carefully go over each prescription with you, before taking them home. Make sure that your name is accurately spelled, that your address is correct. Check the name and dose of your medication to make sure it is accurate. Check the number of pills, and the written instructions to make sure they are clear and accurate. Make sure that you are given enough medication to last  until your next medication refill appointment. 5. Taking Medication: Take medication as prescribed. Never take more pills than instructed. Never take medication more frequently than prescribed. Taking less pills or less frequently is permitted and encouraged, when it comes to controlled substances (written prescriptions).  6. Inform other Doctors: Always inform, all of your healthcare providers, of all the medications you take. 7. Pain Medication from other Providers: You are not allowed to accept any additional pain medication from any other Doctor or Healthcare provider. There are two exceptions to this rule. (see below) In the event that you require additional pain medication, you are responsible for notifying us, as stated below. 8. Medication Agreement: You are responsible for carefully reading and following our Medication Agreement. This must be signed before receiving any prescriptions from our practice. Safely store a copy of your signed Agreement. Violations to the Agreement will result in no further prescriptions. (Additional copies of our Medication Agreement are available upon request.) 9. Laws, Rules, & Regulations: All patients are expected to follow all Federal and State Laws, Statutes, Rules, & Regulations. Ignorance of the Laws does not constitute a valid excuse. The use of any illegal substances is prohibited. 10. Adopted CDC guidelines & recommendations: Target dosing levels will be at or below 60 MME/day. Use of benzodiazepines** is not recommended.  Exceptions: There are only two exceptions to the rule of not receiving pain medications from other Healthcare Providers. 1. Exception #1 (Emergencies): In the event of an emergency (i.e.: accident requiring emergency care), you are allowed to receive additional pain medication. However, you are responsible for: As soon as you are able, call our office [phone], at any time of the day or night, and leave a message stating your name, the  date and nature of the emergency, and the name and dose of the medication   prescribed. In the event that your call is answered by a member of our staff, make sure to document and save the date, time, and the name of the person that took your information.  2. Exception #2 (Planned Surgery): In the event that you are scheduled by another doctor or dentist to have any type of surgery or procedure, you are allowed (for a period no longer than 30 days), to receive additional pain medication, for the acute post-op pain. However, in this case, you are responsible for picking up a copy of our "Post-op Pain Management for Surgeons" handout, and giving it to your surgeon or dentist. This document is available at our office, and does not require an appointment to obtain it. Simply go to our office during business hours (Monday-Thursday from 8:00 AM to 4:00 PM) (Friday 8:00 AM to 12:00 Noon) or if you have a scheduled appointment with us, prior to your surgery, and ask for it by name. In addition, you will need to provide us with your name, name of your surgeon, type of surgery, and date of procedure or surgery.  *Opioid medications include: morphine, codeine, oxycodone, oxymorphone, hydrocodone, hydromorphone, meperidine, tramadol, tapentadol, buprenorphine, fentanyl, methadone. **Benzodiazepine medications include: diazepam (Valium), alprazolam (Xanax), clonazepam (Klonopine), lorazepam (Ativan), clorazepate (Tranxene), chlordiazepoxide (Librium), estazolam (Prosom), oxazepam (Serax), temazepam (Restoril), triazolam (Halcion) (Last updated: 09/15/2017) ____________________________________________________________________________________________   ____________________________________________________________________________________________  Pain Scale  Introduction: The pain score used by this practice is the Verbal Numerical Rating Scale (VNRS-11). This is an 11-point scale. It is for adults and children 10 years or  older. There are significant differences in how the pain score is reported, used, and applied. Forget everything you learned in the past and learn this scoring system.  General Information: The scale should reflect your current level of pain. Unless you are specifically asked for the level of your worst pain, or your average pain. If you are asked for one of these two, then it should be understood that it is over the past 24 hours.  Basic Activities of Daily Living (ADL): Personal hygiene, dressing, eating, transferring, and using restroom.  Instructions: Most patients tend to report their level of pain as a combination of two factors, their physical pain and their psychosocial pain. This last one is also known as "suffering" and it is reflection of how physical pain affects you socially and psychologically. From now on, report them separately. From this point on, when asked to report your pain level, report only your physical pain. Use the following table for reference.  Pain Clinic Pain Levels (0-5/10)  Pain Level Score  Description  No Pain 0   Mild pain 1 Nagging, annoying, but does not interfere with basic activities of daily living (ADL). Patients are able to eat, bathe, get dressed, toileting (being able to get on and off the toilet and perform personal hygiene functions), transfer (move in and out of bed or a chair without assistance), and maintain continence (able to control bladder and bowel functions). Blood pressure and heart rate are unaffected. A normal heart rate for a healthy adult ranges from 60 to 100 bpm (beats per minute).   Mild to moderate pain 2 Noticeable and distracting. Impossible to hide from other people. More frequent flare-ups. Still possible to adapt and function close to normal. It can be very annoying and may have occasional stronger flare-ups. With discipline, patients may get used to it and adapt.   Moderate pain 3 Interferes significantly with activities of daily  living (ADL).   It becomes difficult to feed, bathe, get dressed, get on and off the toilet or to perform personal hygiene functions. Difficult to get in and out of bed or a chair without assistance. Very distracting. With effort, it can be ignored when deeply involved in activities.   Moderately severe pain 4 Impossible to ignore for more than a few minutes. With effort, patients may still be able to manage work or participate in some social activities. Very difficult to concentrate. Signs of autonomic nervous system discharge are evident: dilated pupils (mydriasis); mild sweating (diaphoresis); sleep interference. Heart rate becomes elevated (>115 bpm). Diastolic blood pressure (lower number) rises above 100 mmHg. Patients find relief in laying down and not moving.   Severe pain 5 Intense and extremely unpleasant. Associated with frowning face and frequent crying. Pain overwhelms the senses.  Ability to do any activity or maintain social relationships becomes significantly limited. Conversation becomes difficult. Pacing back and forth is common, as getting into a comfortable position is nearly impossible. Pain wakes you up from deep sleep. Physical signs will be obvious: pupillary dilation; increased sweating; goosebumps; brisk reflexes; cold, clammy hands and feet; nausea, vomiting or dry heaves; loss of appetite; significant sleep disturbance with inability to fall asleep or to remain asleep. When persistent, significant weight loss is observed due to the complete loss of appetite and sleep deprivation.  Blood pressure and heart rate becomes significantly elevated. Caution: If elevated blood pressure triggers a pounding headache associated with blurred vision, then the patient should immediately seek attention at an urgent or emergency care unit, as these may be signs of an impending stroke.    Emergency Department Pain Levels (6-10/10)  Emergency Room Pain 6 Severely limiting. Requires emergency care  and should not be seen or managed at an outpatient pain management facility. Communication becomes difficult and requires great effort. Assistance to reach the emergency department may be required. Facial flushing and profuse sweating along with potentially dangerous increases in heart rate and blood pressure will be evident.   Distressing pain 7 Self-care is very difficult. Assistance is required to transport, or use restroom. Assistance to reach the emergency department will be required. Tasks requiring coordination, such as bathing and getting dressed become very difficult.   Disabling pain 8 Self-care is no longer possible. At this level, pain is disabling. The individual is unable to do even the most "basic" activities such as walking, eating, bathing, dressing, transferring to a bed, or toileting. Fine motor skills are lost. It is difficult to think clearly.   Incapacitating pain 9 Pain becomes incapacitating. Thought processing is no longer possible. Difficult to remember your own name. Control of movement and coordination are lost.   The worst pain imaginable 10 At this level, most patients pass out from pain. When this level is reached, collapse of the autonomic nervous system occurs, leading to a sudden drop in blood pressure and heart rate. This in turn results in a temporary and dramatic drop in blood flow to the brain, leading to a loss of consciousness. Fainting is one of the body's self defense mechanisms. Passing out puts the brain in a calmed state and causes it to shut down for a while, in order to begin the healing process.    Summary: 1. Refer to this scale when providing us with your pain level. 2. Be accurate and careful when reporting your pain level. This will help with your care. 3. Over-reporting your pain level will lead to loss of credibility. 4. Even   a level of 1/10 means that there is pain and will be treated at our facility. 5. High, inaccurate reporting will be  documented as "Symptom Exaggeration", leading to loss of credibility and suspicions of possible secondary gains such as obtaining more narcotics, or wanting to appear disabled, for fraudulent reasons. 6. Only pain levels of 5 or below will be seen at our facility. 7. Pain levels of 6 and above will be sent to the Emergency Department and the appointment cancelled. ____________________________________________________________________________________________    

## 2018-02-27 NOTE — Progress Notes (Signed)
Erica [name]'s Name: Erica [name]  MRN: [id number]  Referring Provider: Denton Lank, MD  DOB: January 11, 1966  PCP: Denton Lank, MD  DOS: 02/27/2018  Note by: Vevelyn Francois NP  Service setting: Ambulatory outpatient  Specialty: Interventional Pain Management  Location: [facility] (AMB) Pain Management Facility    Erica [name] type: Established    Primary Reason(s) for Visit: Encounter for prescription drug management. (Level of risk: moderate)  CC: Back Pain and Hip Pain (left)  HPI  Erica [name] is a 52 y.o. year old, female Erica [name], who comes today for a medication management evaluation. She has Chest pain; Hyponatremia; Shortness of breath; Swelling; Cough; COPD (chronic obstructive pulmonary disease) with acute bronchitis ([place]); Renal insufficiency; Pulmonary HTN ([place]); COPD, mild ([place]); Chronic diastolic heart failure ([place]); Smoker; Obstructive sleep apnea; Tachycardia; Depression; Long term current use of opiate analgesic; Long term prescription opiate use; Opiate use; Chronic low back pain (Primary Source of Pain) (Bilateral) (L>R); Chronic knee pain [facility] source of pain) (Bilateral) (L>R); Chronic neck pain (Bilateral) (R>L); Chronic upper back pain (midline); Chronic foot pain (bottom of feet) (Bilateral) (R>L); Chronic hand pain (Bilateral); Peripheral neuropathy, idiopathic (upper and lower extremity); Chronic sacroiliac joint pain (Bilateral) (R>L); Lumbar facet syndrome (Bilateral) (R>L); Chronic pain syndrome; Elevated sedimentation rate; Elevated C-reactive protein (CRP); Neurogenic pain; Vitamin D deficiency; Lumbar spondylosis; Osteoarthritis of knee (Bilateral) (L>R); Intermittent left thoracic Muscle cramps; Spasm of thoracolumbar muscle (Left); Post-traumatic osteoarthritis of knee (Left); Hemarthrosis, left knee; 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 Her primarily concern today is the Back Pain and Hip Pain (left)  Pain Assessment: Location: Lower Back Radiating: radiates into left hip and sometimes down left leg  Onset: More than a month ago Duration: Chronic pain Quality: Sharp, Shooting, Stabbing, Aching, Discomfort, Restless Severity: 6 /10 (subjective, self-reported pain score)  Note: Reported level is compatible with observation. Clinically the Erica [name] looks like a 2/10 A 2/10 is viewed as "Mild to Moderate" and described as noticeable and distracting. Impossible to hide from other people. More frequent flare-ups. Still possible to adapt and function close to normal. It can be very annoying and may have occasional stronger flare-ups. With discipline, patients may get used to it and adapt. Information on the proper use of the pain scale provided to the Erica [name] today. When using our objective Pain Scale, levels between 6 and 10/10 are said to belong in an emergency room, as it progressively worsens from a 6/10, described as severely limiting, requiring emergency care not usually available at an outpatient pain management facility. At a 6/10 level, communication becomes difficult and requires great effort. Assistance to reach the emergency department may be required. Facial flushing and profuse sweating along with potentially dangerous increases in heart rate and blood pressure will be evident. Effect on ADL: "I cant go up and down stairs or stoop and squat." Timing: Constant Modifying factors: medications BP: (!) 168/96  HR: 89  Erica [name] was last scheduled for an appointment on 12/26/2017 for medication management. During today's appointment we reviewed Erica [name]'s chronic pain status, as well as her outpatient medication regimen. She has occasional  numbness or tingling that goes in to her foot. She has weakness in her knees. She admits that her pain is positional. If  she sits on her legs then she has numbness, tingling. She admits that she was seen by ortho. She admits that she was told that she is in need left hip replacement. She admits that her last injections were not effective . She would like to see him to see what he suggest.   The Erica [name]  reports that she does not use drugs. Her body mass index is 30.41 kg/m.  Further details on both, my assessment(s), as well as the proposed treatment plan, please see below.  Controlled Substance Pharmacotherapy Assessment REMS (Risk Evaluation and Mitigation Strategy)  Analgesic:Oxycodone IR 5 mg 1 tablet by mouth every 8 hours (45m/dayof oxycodone) (22.5MME/day) MME/day:22.571mday. TiDewayne ShorterRN  02/27/2018 10:59 AM  Signed Nursing Pain Medication Assessment:  Safety precautions to be maintained throughout the outpatient stay will include: orient to surroundings, keep bed in low position, maintain call bell within reach at all times, provide assistance with transfer out of bed and ambulation.  Medication Inspection Compliance: Pill count conducted under aseptic conditions, in front of the Erica [name]. Neither the pills nor the bottle was removed from the Erica [name]'s sight at any time. Once count was completed pills were immediately returned to the Erica [name] in their original bottle.  Medication: Oxycodone IR Pill/Patch Count: 0 of 90 pills remain Pill/Patch Appearance: Markings consistent with prescribed medication Bottle Appearance: Standard pharmacy container. Clearly labeled. Filled Date:07 / 10/ 2019 Last Medication intake:  Day before yesterday   Pharmacokinetics: Liberation and absorption (onset of action): WNL Distribution (time to peak effect): WNL Metabolism and excretion (duration of action): WNL         Pharmacodynamics: Desired effects: Analgesia: [name] >50%  benefit. Functional ability: Erica [name] reports that medication allows her to accomplish basic ADLs Clinically meaningful improvement in function (CMIF): Sustained CMIF goals met Perceived effectiveness: Described as relatively effective, allowing for increase in activities of daily living (ADL) Undesirable effects: Side-effects or Adverse reactions: None reported Monitoring: [place] PMP: Online review of the past 1238-monthriod conducted. Compliant with practice rules and regulations Last UDS on record: Summary  Date Value Ref Range Status  09/14/2017 FINAL  Final    Comment:    ==================================================================== TOXASSURE SELECT 13 (MW) ==================================================================== Test                             Result       Flag       Units Drug Absent but Declared for Prescription Verification   Oxycodone                      Not Detected UNEXPECTED ng/mg creat ==================================================================== Test                      Result    Flag   Units      Ref Range   Creatinine              35               mg/dL      >=20 ==================================================================== Declared Medications:  The flagging and interpretation on this report are based on the  following declared medications.  Unexpected results may arise from  inaccuracies in the declared medications.  **Note: The testing scope of this panel includes these medications:  Oxycodone  **Note: The testing scope of this panel does not include following  reported medications:  Albuterol  Aspirin (  Aspirin 81)  Atorvastatin  Diltiazem  Duloxetine  Fluticasone  Folic acid  Furosemide  Losartan (Losartan Potassium)  Magnesium Oxide  Metformin  Oxygen  Pantoprazole  Potassium  Pregabalin  Salmeterol  Sucralfate  Tiotropium  Tizanidine  Vitamin  D3 ==================================================================== For clinical consultation, please call [phone]. ====================================================================    UDS interpretation: Non-Compliant The Erica [name] was given a final warning about the accuracy of reporting medications. Medication Assessment Form: Reviewed. Erica [name] indicates being compliant with therapy Treatment compliance: Compliant Risk Assessment Profile: Aberrant behavior: See prior evaluations. None observed or detected today Comorbid factors increasing risk of overdose: See prior notes. No additional risks detected today Risk of substance use disorder (SUD): Low Opioid Risk Tool - 02/27/18 1057      Family History of Substance Abuse   Alcohol  Negative    Illegal Drugs  Negative    Rx Drugs  Negative      Personal History of Substance Abuse   Alcohol  Negative    Illegal Drugs  Negative    Rx Drugs  Negative      Age   Age between 55-45 years   No      Psychological Disease   Depression  Positive      Total Score   Opioid Risk Tool Scoring  1    Opioid Risk Interpretation  Low Risk      ORT Scoring interpretation table:  Score <3 = Low Risk for SUD  Score between 4-7 = Moderate Risk for SUD  Score >8 = High Risk for Opioid Abuse   Risk Mitigation Strategies:  Erica [name] Counseling: Covered Erica [name]-Prescriber Agreement (PPA): Present and active  Notification to other healthcare providers: Done  Pharmacologic Plan: No change in therapy, at this time.             Laboratory Chemistry  Inflammation Markers (CRP: Acute Phase) (ESR: Chronic Phase) Lab Results  Component Value Date   CRP 1.3 (H) 04/30/2016   ESRSEDRATE 31 (H) 04/30/2016                         Rheumatology Markers Lab Results  Component Value Date   RF <10.0 06/29/2016   ANA Negative 06/29/2016   LABURIC 7.1 (H) 06/19/2015                        Renal Function Markers Lab Results  Component  Value Date   BUN 8 02/06/2018   CREATININE 0.73 02/06/2018   BCR 15 04/22/2016   GFRAA >60 02/06/2018   GFRNONAA >60 02/06/2018                             Hepatic Function Markers Lab Results  Component Value Date   AST 24 02/06/2018   ALT 15 02/06/2018   ALBUMIN 3.7 02/06/2018   ALKPHOS 72 02/06/2018   LIPASE 90 02/10/2013                        Electrolytes Lab Results  Component Value Date   NA 138 02/06/2018   K 4.6 02/06/2018   CL 103 02/06/2018   CALCIUM 9.3 02/06/2018   MG 1.9 04/30/2016                        Neuropathy Markers Lab Results  Component Value Date   VITAMINB12 248  04/30/2016   HGBA1C 6.2 (H) 06/14/2015   HIV NON REACTIVE 06/14/2015                        Bone Pathology Markers Lab Results  Component Value Date   25OHVITD1 15 (L) 04/30/2016   25OHVITD2 <1.0 04/30/2016   25OHVITD3 15 04/30/2016                         Coagulation Parameters Lab Results  Component Value Date   INR 0.96 02/06/2018   LABPROT 12.7 02/06/2018   APTT 35 02/06/2018   PLT 251 02/06/2018                        Cardiovascular Markers Lab Results  Component Value Date   BNP 42.0 06/14/2016   CKTOTAL 56 03/10/2013   CKMB 0.7 03/10/2013   TROPONINI <0.03 02/06/2018   HGB 11.5 (L) 02/06/2018   HCT 34.7 (L) 02/06/2018                         CA Markers No results found for: CEA, CA125, LABCA2                      Note: Lab results reviewed.  Recent Diagnostic Imaging Results  CT HEAD WO CONTRAST CLINICAL DATA:  Pt reports noticing left side facial paralysis and numbness to left arm "yesterday evening." Pt unable to provide definite last known well time  EXAM: CT HEAD WITHOUT CONTRAST  TECHNIQUE: Contiguous axial images were obtained from the base of the skull through the vertex without intravenous contrast.  COMPARISON:  None.  FINDINGS: Brain: Mild atrophy. No evidence of acute infarction, hemorrhage, hydrocephalus, extra-axial collection or  mass lesion/mass effect.  Vascular: No hyperdense vessel or unexpected calcification.  Skull: Normal. Negative for fracture or focal lesion.  Sinuses/Orbits: No acute finding.  Other: None.  IMPRESSION: 1. Mild atrophy.  No acute findings.  Electronically Signed   By: Lucrezia Europe M.D.   On: 02/06/2018 18:23  Complexity Note: Imaging results reviewed. Results shared with Ms. Melina Copa, using [employer].                         Meds   Current Outpatient Medications:  .  albuterol (PROAIR HFA) 108 (90 Base) MCG/ACT inhaler, Inhale into the lungs 4 (four) times daily., Disp: , Rfl:  .  aspirin EC 81 MG EC tablet, Take 1 tablet (81 mg total) by mouth daily., Disp: 30 tablet, Rfl: 0 .  atorvastatin (LIPITOR) 80 MG tablet, Take 80 mg by mouth daily., Disp: , Rfl:  .  Blood Glucose Monitoring Suppl (ACCU-CHEK AVIVA PLUS) w/Device KIT, See admin instructions., Disp: , Rfl: 0 .  CARTIA XT 120 MG 24 hr capsule, take 1 capsule by mouth once daily, Disp: 30 capsule, Rfl: 11 .  Cholecalciferol (VITAMIN D3) 2000 units capsule, Take 1 capsule (2,000 Units total) by mouth daily., Disp: 30 capsule, Rfl: PRN .  diltiazem (CARDIZEM) 30 MG tablet, Take 1 tablet (30 mg total) by mouth 3 (three) times daily as needed., Disp: 90 tablet, Rfl: 3 .  DULoxetine (CYMBALTA) 30 MG capsule, Take 30 mg by mouth., Disp: , Rfl:  .  DULoxetine (CYMBALTA) 60 MG capsule, Take 60 mg by mouth daily., Disp: , Rfl:  .  fluconazole (DIFLUCAN) 150 MG tablet,  TAKE 1 TABLET NOW FOR YEAST INFECTION, MAY REPEAT IN 3 DAYS IF SYMPTOMS PERSIST, Disp: , Rfl: 0 .  Fluticasone-Salmeterol (ADVAIR DISKUS IN), Inhale into the lungs 2 (two) times daily., Disp: , Rfl:  .  folic acid (FOLVITE) 1 MG tablet, Take 1 mg by mouth daily., Disp: , Rfl:  .  furosemide (LASIX) 40 MG tablet, Take 40 mg by mouth daily. Take an additional 40 mg in the am for weight gain as needed, Disp: , Rfl:  .  losartan (COZAAR) 100 MG tablet, Take 1/2 tablet (50  mg) by mouth twice daily, Disp: , Rfl:  .  Magnesium Oxide, Antacid, 500 MG CAPS, once daily., Disp: , Rfl:  .  metFORMIN (GLUCOPHAGE) 500 MG tablet, Take 500 mg by mouth 2 (two) times daily with a meal., Disp: , Rfl:  .  [START ON 03/29/2018] oxyCODONE (OXY IR/ROXICODONE) 5 MG immediate release tablet, Take 1 tablet (5 mg total) by mouth every 8 (eight) hours as needed for severe pain., Disp: 90 tablet, Rfl: 0 .  OXYGEN, Inhale into the lungs. 2 liters at bedtime, Disp: , Rfl:  .  pantoprazole (PROTONIX) 40 MG tablet, Take 1 tablet (40 mg total) by mouth daily., Disp: 30 tablet, Rfl: 0 .  potassium chloride SA (K-DUR,KLOR-CON) 20 MEQ tablet, Take 20 mEq by mouth 2 (two) times daily., Disp: , Rfl:  .  pregabalin (LYRICA) 75 MG capsule, Take 1 capsule (75 mg total) by mouth 3 (three) times daily., Disp: 90 capsule, Rfl: 1 .  RA MAGNESIUM 500 MG CAPS, Take 1 capsule by mouth 2 (two) times daily., Disp: , Rfl: 0 .  sucralfate (CARAFATE) 1 G tablet, Take 1 tablet (1 g total) by mouth 4 (four) times daily -  with meals and at bedtime. (Erica [name] taking differently: Take 1 g by mouth 2 (two) times daily. ), Disp: 120 tablet, Rfl: 0 .  tiotropium (SPIRIVA HANDIHALER) 18 MCG inhalation capsule, Place 18 mcg into inhaler and inhale daily., Disp: , Rfl:  .  tiZANidine (ZANAFLEX) 4 MG capsule, Take 1 capsule (4 mg total) by mouth 3 (three) times daily as needed for muscle spasms., Disp: 90 capsule, Rfl: 1 .  oxyCODONE (OXY IR/ROXICODONE) 5 MG immediate release tablet, Take 1 tablet (5 mg total) by mouth every 8 (eight) hours as needed for severe pain., Disp: 90 tablet, Rfl: 0  ROS  Constitutional: Denies any fever or chills Gastrointestinal: No reported hemesis, hematochezia, vomiting, or acute GI distress Musculoskeletal: Denies any acute onset joint swelling, redness, loss of ROM, or weakness Neurological: No reported episodes of acute onset apraxia, aphasia, dysarthria, agnosia, amnesia, paralysis, loss of  coordination, or loss of consciousness  Allergies  Ms. Lawhorn is allergic to tramadol and penicillins.  [place]  Drug: Ms. Mondesir  reports that she does not use drugs. Alcohol:  reports that she does not drink alcohol. Tobacco:  reports that she quit smoking about 2 years ago. Her smoking use included cigarettes. She has a 36.00 pack-year smoking history. She has never used smokeless tobacco. Medical:  has a past medical history of (HFpEF) heart failure with preserved ejection fraction ([place]), Arthritis, Asthma, Bell's palsy, CHF (congestive heart failure) ([place]), COPD (chronic obstructive pulmonary disease) ([place]), Diabetes mellitus without complication ([place]), Gastric ulcer, Hyperlipidemia, Hypertension, OSA (obstructive sleep apnea), and Shoulder injury. Surgical: Ms. Litsey  has a past surgical history that includes Knee surgery (Left) and Flexible bronchoscopy (N/A, 06/20/2015). Family: family history includes Breast cancer in her mother; Diabetes in her  maternal grandmother; Lung cancer in her father.  Constitutional Exam  General appearance: Well nourished, well developed, and well hydrated. In no apparent acute distress Vitals:   02/27/18 1050  BP: (!) 168/96  Pulse: 89  Resp: 18  Temp: 98.2 F (36.8 C)  SpO2: 99%  Weight: 200 lb (90.7 kg)  Height: _0  (1.727 m)  Psych/Mental status: Alert, oriented x 3 (person, place, & time)       Eyes: PERLA Respiratory: No evidence of acute respiratory distress  Lumbar Spine Area Exam  Skin & Axial Inspection: No masses, redness, or swelling Alignment: Symmetrical Functional ROM: Unrestricted ROM       Stability: No instability detected Muscle Tone/Strength: Functionally intact. No obvious neuro-muscular anomalies detected. Sensory (Neurological): Unimpaired Palpation: Complains of area being tender to palpation       Provocative Tests: Hyperextension/rotation test: deferred today       Lumbar quadrant test (Kemp's test): deferred today        Lateral bending test: deferred today       Patrick's Maneuver: deferred today                   FABER test: deferred today                   S-I anterior distraction/compression test: deferred today         S-I lateral compression test: deferred today         S-I Thigh-thrust test: deferred today         S-I Gaenslen's test: deferred today          Gait & Posture Assessment  Ambulation: Unassisted Gait: Relatively normal for age and body habitus Posture: WNL   Lower Extremity Exam    Side: Right lower extremity  Side: Left lower extremity  Stability: No instability observed          Stability: No instability observed          Skin & Extremity Inspection: Skin color, temperature, and hair growth are WNL. No peripheral edema or cyanosis. No masses, redness, swelling, asymmetry, or associated skin lesions. No contractures.  Skin & Extremity Inspection: Evidence of prior arthroplastic surgery  Functional ROM: Unrestricted ROM                  Functional ROM: Unrestricted ROM                  Muscle Tone/Strength: Able to Toe-walk & Heel-walk without problems  Muscle Tone/Strength: Able to Toe-walk & Heel-walk without problems  Sensory (Neurological): Unimpaired  Sensory (Neurological): Unimpaired  Palpation: No palpable anomalies  Palpation: No palpable anomalies   Assessment  Primary Diagnosis & Pertinent Problem List: The primary encounter diagnosis was Lumbar spondylosis. Diagnoses of 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), Osteoarthritis of knee (Bilateral) (L>R), Vitamin D deficiency, Chronic pain syndrome, Neurogenic pain, Peripheral neuropathy, idiopathic (upper and lower extremity), Intermittent left thoracic Muscle cramps, Spasm of thoracolumbar muscle (Left), and Post-traumatic osteoarthritis of knee (Left) were also pertinent to this visit.  Status Diagnosis  Persistent Persistent Persistent 1. Lumbar spondylosis   2. 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  Osteoarthritis of knee (Bilateral) (L>R)   4. Vitamin D deficiency   5. Chronic pain syndrome   6. Neurogenic pain   7. Peripheral neuropathy, idiopathic (upper and lower extremity)   8. Intermittent left thoracic Muscle cramps   9. Spasm of thoracolumbar muscle (Left)   10. Post-traumatic osteoarthritis  of knee (Left)     Problems updated and reviewed during this visit: No problems updated. Plan of Care  Pharmacotherapy (Medications Ordered): Meds ordered this encounter  Medications  . Cholecalciferol (VITAMIN D3) 2000 units capsule    Sig: Take 1 capsule (2,000 Units total) by mouth daily.    Dispense:  30 capsule    Refill:  PRN    Do not add to the "Automatic Refill" notification system.    Order Specific Question:   Supervising Provider    Answer:   Milinda Pointer [phone]  . tiZANidine (ZANAFLEX) 4 MG capsule    Sig: Take 1 capsule (4 mg total) by mouth 3 (three) times daily as needed for muscle spasms.    Dispense:  90 capsule    Refill:  1    Do not place this medication, or any other prescription from our practice, on "Automatic Refill". Erica [name] may have prescription filled one day early if pharmacy is closed on scheduled refill date.    Order Specific Question:   Supervising Provider    Answer:   Milinda Pointer [phone]  . oxyCODONE (OXY IR/ROXICODONE) 5 MG immediate release tablet    Sig: Take 1 tablet (5 mg total) by mouth every 8 (eight) hours as needed for severe pain.    Dispense:  90 tablet    Refill:  0    Do not place this medication, on "Automatic Refill". Erica [name] may have prescription filled one day early if pharmacy is closed on scheduled refill date. Do not fill until:02/27/2018 To last until: 03/29/2018    Order Specific Question:   Supervising Provider    Answer:   Milinda Pointer [phone]  . oxyCODONE (OXY IR/ROXICODONE) 5 MG immediate release tablet    Sig: Take 1 tablet (5 mg total) by mouth every 8 (eight) hours as needed for severe pain.     Dispense:  90 tablet    Refill:  0    Do not place this medication on "Automatic Refill". Erica [name] may have prescription filled one day early if pharmacy is closed on scheduled refill date. Do not fill until:03/29/2018 To last until: 04/28/2018    Order Specific Question:   Supervising Provider    Answer:   Milinda Pointer [phone]  . pregabalin (LYRICA) 75 MG capsule    Sig: Take 1 capsule (75 mg total) by mouth 3 (three) times daily.    Dispense:  90 capsule    Refill:  1    Do not add this medication to the electronic "Automatic Refill" notification system. Erica [name] may have prescription filled one day early if pharmacy is closed on scheduled refill date.    Order Specific Question:   Supervising Provider    Answer:   Milinda Pointer [[id number]]   New Prescriptions   No medications on file   Medications administered today: Aldea A. Soberanis had no medications administered during this visit. Lab-work, procedure(s), and/or referral(s): No orders of the defined types were placed in this encounter.  Imaging and/or referral(s): None  Interventional therapies: Planned, scheduled, and/or pending:   Not at this time.   Considering:  Diagnostic bilateral lumbar facet block #3 Possible bilateral lumbar facet radiofrequency ablation  Possible bilateral diagnostic sacroiliac joint block  Possible bilateral intra-articular hip joint injection  Possible bilateral intra-articular knee joint injection Possible bilateral genicular nerve block  Possible bilateral knee joint radiofrequency ablation.  Possible left-sided lumbar epidural steroid injection Possible right-sided cervical epidural steroid injection Possible bilateral diagnostic cervical facet block Possible bilateral  diagnostic greater occipital nerve blocks Possible greater occipital nerve radiofrequency ablation.   Palliative PRN treatment(s):  Palliative bilateral lumbar facet radiofrequency ablation     Provider-requested follow-up: Return in about 7 weeks (around 04/17/2018) for MedMgmt with Me Dionisio David), in addition, follow up, w/ Dr. Dossie Arbour.  Future Appointments  Date Time Provider [place]  03/29/2018  2:20 PM Alisa Graff, FNP [facility] None  04/19/2018 12:45 PM Vevelyn Francois, NP [facility] None   Primary Care Physician: Denton Lank, MD Location: [facility] Outpatient Pain Management Facility Note by: Vevelyn Francois NP Date: 02/27/2018; Time: 3:40 PM  Pain Score Disclaimer: We use the NRS-11 scale. This is a self-reported, subjective measurement of pain severity with only modest accuracy. It is used primarily to identify changes within a particular Erica [name]. It must be understood that outpatient pain scales are significantly less accurate that those used for research, where they can be applied under ideal controlled circumstances with minimal exposure to variables. In reality, the score is likely to be a combination of pain intensity and pain affect, where pain affect describes the degree of emotional arousal or changes in action readiness caused by the sensory experience of pain. Factors such as social and work situation, setting, emotional state, anxiety levels, expectation, and prior pain experience may influence pain perception and show large inter-individual differences that may also be affected by time variables.  Erica [name] instructions provided during this appointment: Erica [name] Instructions   ____________________________________________________________________________________________  Medication Rules  Applies to: All patients receiving prescriptions (written or electronic).  Pharmacy of record: Pharmacy where electronic prescriptions will be sent. If written prescriptions are taken to a different pharmacy, please inform the nursing staff. The pharmacy listed in the electronic medical record should be the one where you would like electronic prescriptions to be  sent.  Prescription refills: Only during scheduled appointments. Applies to both, written and electronic prescriptions.  NOTE: The following applies primarily to controlled substances (Opioid* Pain Medications).   Erica [name]'s responsibilities: 1. Pain Pills: Bring all pain pills to every appointment (except for procedure appointments). 2. Pill Bottles: Bring pills in original pharmacy bottle. Always bring newest bottle. Bring bottle, even if empty. 3. Medication refills: You are responsible for knowing and keeping track of what medications you need refilled. The day before your appointment, write a list of all prescriptions that need to be refilled. Bring that list to your appointment and give it to the admitting nurse. Prescriptions will be written only during appointments. If you forget a medication, it will not be "Called in", "Faxed", or "electronically sent". You will need to get another appointment to get these prescribed. 4. Prescription Accuracy: You are responsible for carefully inspecting your prescriptions before leaving our office. Have the discharge nurse carefully go over each prescription with you, before taking them home. Make sure that your name is accurately spelled, that your address is correct. Check the name and dose of your medication to make sure it is accurate. Check the number of pills, and the written instructions to make sure they are clear and accurate. Make sure that you are given enough medication to last until your next medication refill appointment. 5. Taking Medication: Take medication as prescribed. Never take more pills than instructed. Never take medication more frequently than prescribed. Taking less pills or less frequently is permitted and encouraged, when it comes to controlled substances (written prescriptions).  6. Inform other Doctors: Always inform, all of your healthcare providers, of all the medications you take. 7. Pain  Medication from other Providers: You  are not allowed to accept any additional pain medication from any other Doctor or Healthcare provider. There are two exceptions to this rule. (see below) In the event that you require additional pain medication, you are responsible for notifying us, as stated below. 8. Medication Agreement: You are responsible for carefully reading and following our Medication Agreement. This must be signed before receiving any prescriptions from our practice. Safely store a copy of your signed Agreement. Violations to the Agreement will result in no further prescriptions. (Additional copies of our Medication Agreement are available upon request.) 9. Laws, Rules, & Regulations: All patients are expected to follow all Federal and [employer], [employer], Rules, [employer]. Ignorance of the Laws does not constitute a valid excuse. The use of any illegal substances is prohibited. 10. Adopted CDC guidelines & recommendations: Target dosing levels will be at or below 60 MME/day. Use of benzodiazepines** is not recommended.  Exceptions: There are only two exceptions to the rule of not receiving pain medications from other Healthcare Providers. 1. Exception #1 (Emergencies): In the event of an emergency (i.e.: accident requiring emergency care), you are allowed to receive additional pain medication. However, you are responsible for: As soon as you are able, call our office [phone], at any time of the day or night, and leave a message stating your name, the date and nature of the emergency, and the name and dose of the medication prescribed. In the event that your call is answered by a member of our staff, make sure to document and save the date, time, and the name of the person that took your information.  2. Exception #2 (Planned Surgery): In the event that you are scheduled by another doctor or dentist to have any type of surgery or procedure, you are allowed (for a period no longer than 30 days), to receive additional pain  medication, for the acute post-op pain. However, in this case, you are responsible for picking up a copy of our "Post-op Pain Management for Surgeons" handout, and giving it to your surgeon or dentist. This document is available at our office, and does not require an appointment to obtain it. Simply go to our office during business hours (Monday-Thursday from 8:00 AM to 4:00 PM) (Friday 8:00 AM to 12:00 Noon) or if you have a scheduled appointment with [place], prior to your surgery, and ask for it by name. In addition, you will need to provide [place] with your name, name of your surgeon, type of surgery, and date of procedure or surgery.  *Opioid medications include: morphine, codeine, oxycodone, oxymorphone, hydrocodone, hydromorphone, meperidine, tramadol, tapentadol, buprenorphine, fentanyl, methadone. **Benzodiazepine medications include: diazepam (Valium), alprazolam (Xanax), clonazepam (Klonopine), lorazepam (Ativan), clorazepate (Tranxene), chlordiazepoxide (Librium), estazolam (Prosom), oxazepam (Serax), temazepam (Restoril), triazolam (Halcion) (Last updated: 09/15/2017) ____________________________________________________________________________________________   ____________________________________________________________________________________________  Pain Scale  Introduction: The pain score used by this practice is the Verbal Numerical Rating Scale (VNRS-11). This is an 11-point scale. It is for adults and children 10 years or older. There are significant differences in how the pain score is reported, used, and applied. Forget everything you learned in the past and learn this scoring system.  General Information: The scale should reflect your current level of pain. Unless you are specifically asked for the level of your worst pain, or your average pain. If you are asked for one of these two, then it should be understood that it is over the past 24 hours.  Basic Activities of  Daily Living  (ADL): Personal hygiene, dressing, eating, transferring, and using restroom.  Instructions: Most patients tend to report their level of pain as a combination of two factors, their physical pain and their psychosocial pain. This last one is also known as "suffering" and it is reflection of how physical pain affects you socially and psychologically. From now on, report them separately. From this point on, when asked to report your pain level, report only your physical pain. Use the following table for reference.  Pain Clinic Pain Levels (0-5/10)  Pain Level Score  Description  No Pain 0   Mild pain 1 Nagging, annoying, but does not interfere with basic activities of daily living (ADL). Patients are able to eat, bathe, get dressed, toileting (being able to get on and off the toilet and perform personal hygiene functions), transfer (move in and out of bed or a chair without assistance), and maintain continence (able to control bladder and bowel functions). Blood pressure and heart rate are unaffected. A normal heart rate for a healthy adult ranges from 60 to 100 bpm (beats per minute).   Mild to moderate pain 2 Noticeable and distracting. Impossible to hide from other people. More frequent flare-ups. Still possible to adapt and function close to normal. It can be very annoying and may have occasional stronger flare-ups. With discipline, patients may get used to it and adapt.   Moderate pain 3 Interferes significantly with activities of daily living (ADL). It becomes difficult to feed, bathe, get dressed, get on and off the toilet or to perform personal hygiene functions. Difficult to get in and out of bed or a chair without assistance. Very distracting. With effort, it can be ignored when deeply involved in activities.   Moderately severe pain 4 Impossible to ignore for more than a few minutes. With effort, patients may still be able to manage work or participate in some social activities. Very difficult to  concentrate. Signs of autonomic nervous system discharge are evident: dilated pupils (mydriasis); mild sweating (diaphoresis); sleep interference. Heart rate becomes elevated (>115 bpm). Diastolic blood pressure (lower number) rises above 100 mmHg. Patients find relief in laying down and not moving.   Severe pain 5 Intense and extremely unpleasant. Associated with frowning face and frequent crying. Pain overwhelms the senses.  Ability to do any activity or maintain social relationships becomes significantly limited. Conversation becomes difficult. Pacing back and forth is common, as getting into a comfortable position is nearly impossible. Pain wakes you up from deep sleep. Physical signs will be obvious: pupillary dilation; increased sweating; goosebumps; brisk reflexes; cold, clammy hands and feet; nausea, vomiting or dry heaves; loss of appetite; significant sleep disturbance with inability to fall asleep or to remain asleep. When persistent, significant weight loss is observed due to the complete loss of appetite and sleep deprivation.  Blood pressure and heart rate becomes significantly elevated. Caution: If elevated blood pressure triggers a pounding headache associated with blurred vision, then the Erica [name] should immediately seek attention at an urgent or emergency care unit, as these may be signs of an impending stroke.    Emergency Department Pain Levels (6-10/10)  Emergency Room Pain 6 Severely limiting. Requires emergency care and should not be seen or managed at an outpatient pain management facility. Communication becomes difficult and requires great effort. Assistance to reach the emergency department may be required. Facial flushing and profuse sweating along with potentially dangerous increases in heart rate and blood pressure will be evident.   Distressing pain 7  Self-care is very difficult. Assistance is required to transport, or use restroom. Assistance to reach the emergency department  will be required. Tasks requiring coordination, such as bathing and getting dressed become very difficult.   Disabling pain 8 Self-care is no longer possible. At this level, pain is disabling. The individual is unable to do even the most "basic" activities such as walking, eating, bathing, dressing, transferring to a bed, or toileting. Fine motor skills are lost. It is difficult to think clearly.   Incapacitating pain 9 Pain becomes incapacitating. Thought processing is no longer possible. Difficult to remember your own name. Control of movement and coordination are lost.   The worst pain imaginable 10 At this level, most patients pass out from pain. When this level is reached, collapse of the autonomic nervous system occurs, leading to a sudden drop in blood pressure and heart rate. This in turn results in a temporary and dramatic drop in blood flow to the brain, leading to a loss of consciousness. Fainting is one of the body's self defense mechanisms. Passing out puts the brain in a calmed state and causes it to shut down for a while, in order to begin the healing process.    Summary: 1. Refer to this scale when providing [place] with your pain level. 2. Be accurate and careful when reporting your pain level. This will help with your care. 3. Over-reporting your pain level will lead to loss of credibility. 4. Even a level of 1/10 means that there is pain and will be treated at our facility. 5. High, inaccurate reporting will be documented as "Symptom Exaggeration", leading to loss of credibility and suspicions of possible secondary gains such as obtaining more narcotics, or wanting to appear disabled, for fraudulent reasons. 6. Only pain levels of 5 or below will be seen at our facility. 7. Pain levels of 6 and above will be sent to the Emergency Department and the appointment cancelled. ____________________________________________________________________________________________

## 2018-03-01 ENCOUNTER — Ambulatory Visit: Payer: Medicaid Other | Admitting: Pain Medicine

## 2018-03-27 ENCOUNTER — Ambulatory Visit: Payer: Medicaid Other | Admitting: Family

## 2018-03-28 ENCOUNTER — Telehealth: Payer: Self-pay | Admitting: Nurse Practitioner

## 2018-03-28 NOTE — Telephone Encounter (Signed)
According to the computer it was Escribed to [facility] Please verify Thanks

## 2018-03-28 NOTE — Telephone Encounter (Signed)
Patient was going to [facility] and her pharmacy closed down. [facility] [address], the new one. [facility] did not transfer her Oxycodone to [facility]. Please call patient and let her know when she can pick up.

## 2018-03-29 ENCOUNTER — Other Ambulatory Visit: Payer: Self-pay | Admitting: Nurse Practitioner

## 2018-03-29 ENCOUNTER — Telehealth: Payer: Self-pay | Admitting: Family

## 2018-03-29 ENCOUNTER — Ambulatory Visit: Payer: Medicaid Other | Admitting: Family

## 2018-03-29 ENCOUNTER — Telehealth: Payer: Self-pay

## 2018-03-29 DIAGNOSIS — G894 Chronic pain syndrome: Secondary | ICD-10-CM

## 2018-03-29 MED ORDER — OXYCODONE HCL 5 MG PO TABS: 5.0000 mg | ORAL_TABLET | Freq: Three times a day (TID) | ORAL | 0 refills | Status: DC | PRN

## 2018-03-29 NOTE — Progress Notes (Deleted)
Patient ID: Erica [name], female    DOB: 1965/11/16, 52 y.o.   MRN: [id number]  HPI  Erica [name] is a 52 y/o female with a history of obstructive sleep apnea, HTN, hyperlipidemia, COPD, asthma, pulmonary HTN, chronic tobacco use and chronic heart failure.  Last echo was done on 06/14/15 and showed an EF of 60-65% with moderately elevated PA pressure at 56 mm Hg. Last PFT's were done 04/19/16. Recently had a chest CT done on 04/27/16 due to ground glass infiltrate.   Was in the ED 02/06/18 due to Bell's Palsy. CT was negative for a stroke. Treated with prednisone and antiviral and released. Was in ED 12/25/17 due to right shoulder pain. She was evaluated and released with her right arm in a sling due to "torn muscle".   She presents today for a follow-up visit with a chief complaint of   Past Medical History:  Diagnosis Date  . Arthritis   . Asthma   . CHF (congestive heart failure) (HCC)   . COPD (chronic obstructive pulmonary disease) (HCC)   . Gastric ulcer   . Hyperlipidemia   . Hypertension   . OSA on CPAP no ins - no cpap machine for the last 4 years   Past Surgical History:  Procedure Laterality Date  . FLEXIBLE BRONCHOSCOPY N/A 06/20/2015   Procedure: FLEXIBLE BRONCHOSCOPY;  Surgeon: Shane Crutch, MD;  Location: [facility] ORS;  Service: Pulmonary;  Laterality: N/A;  . KNEE SURGERY Left    8 knee surgeries   Family History  Problem Relation Age of Onset  . Lung cancer Father   . Breast cancer Mother        early 4's  . Diabetes Maternal Grandmother    Social History  Substance Use Topics  . Smoking status: Former Smoker    Packs/day: 1.00    Years: 36.00    Types: Cigarettes    Quit date: 06/13/2015  . Smokeless tobacco: Never Used  . Alcohol use 0.0 oz/week     Comment: occasionaly   Allergies  Allergen Reactions  . Tramadol Other (See Comments)    Heart palpatations  . Penicillins Rash    Review of Systems  Constitutional: Negative for appetite change  and fever.  HENT: Positive for congestion and sinus pressure. Negative for postnasal drip.   Eyes: Positive for visual disturbance (blurry vision).  Respiratory: Positive for cough and shortness of breath. Negative for chest tightness.   Cardiovascular: Positive for chest pain (at times) and palpitations (at times). Negative for leg swelling.  Gastrointestinal: Negative for abdominal distention and abdominal pain.  Endocrine: Negative.   Genitourinary: Negative.   Musculoskeletal: Positive for arthralgias (right shoulder) and back pain (low back pain). Negative for neck stiffness.  Skin: Negative.   Allergic/Immunologic: Negative.   Neurological: Positive for dizziness (with position changes) and weakness ("off-balance at times"). Negative for light-headedness and headaches.  Hematological: Negative for adenopathy. Does not bruise/bleed easily.  Psychiatric/Behavioral: Positive for sleep disturbance (wearing oxygen at bedtime at 2L and PRN during the day). Negative for dysphoric mood. The patient is not nervous/anxious.     Physical Exam  Constitutional: She is oriented to person, place, and time. She appears well-developed and well-nourished.  HENT:  Head: Normocephalic and atraumatic.  Neck: Normal range of motion. Neck supple. No JVD present.  Cardiovascular: Normal rate and regular rhythm.  Pulmonary/Chest: Effort normal. She has no wheezes. She has no rales.  Abdominal: Soft. She exhibits no distension. There is no tenderness.  Musculoskeletal: She exhibits tenderness (right shoulder pain). She exhibits no edema.  Neurological: She is alert and oriented to person, place, and time.  Skin: Skin is warm and dry.  Psychiatric: She has a normal mood and affect. Her behavior is normal. Thought content normal.  Nursing note and vitals reviewed.    Assessment & Plan:  1: Chronic heart failure with preserved ejection fraction- - NYHA class III - euvolemic - weighing daily; reminded to  call for an overnight weight gain of >2 pounds or a weekly weight gain of >5 pounds - weight  - not adding any salt to her food - has been trying to drink more water but still occasionally drinks pepsi - Unable to wear CPAP as it causes panic attacks. Does wear oxygen at 2L at bedtime as well as when she is up and about - saw cardiology Brion Aliment) 09/08/17 & losartan increased to 50mg  BID - will schedule an ECHO sometime in the next few weeks  2: Pulmonary HTN- - Saw pulmonologist Meredeth Ide) 09/28/17 - Wearing oxygent at 2L at bedtime and when active  3: Diabetes- - sees Dr. Allena Katz at [facility] - still doesn't have a glucometer; is planning on getting RX from her PCP - has heard from [facility] but has to postpone due to transportation issues  4: HTN- - Currently taking Cardizem CD 120mg  daily with an additional Cardizem 30mg  prn.  - still checking her BP at home; states that it runs in 130-140's systolic - BMP 02/06/18 showed Na 138, K 4.6, GFR > 60, Scr 0.73   Patient did not bring her medications nor a list. Each medication was verbally reviewed with the patient and she was encouraged to bring the bottles to every visit to confirm accuracy of list.    Yolanda Bonine, PharmD Pharmacy Resident

## 2018-03-29 NOTE — Telephone Encounter (Signed)
Patient did not show for her [facility] appointment on 03/29/18. Will attempt to reschedule.  

## 2018-03-29 NOTE — Telephone Encounter (Signed)
Called  [facility] and they stated that they did not have a prescription dated for 03/29/18. [facility] and narcotic prescriptions were not transferred. Can you escribe one for September?   Please let me know if this was done so we can call patient back. Thanks 

## 2018-03-29 NOTE — Telephone Encounter (Signed)
Called  [facility] and they stated that they did not have a prescription dated for 03/29/18. [facility] and narcotic prescriptions were not transferred. Can you escribe one for September?   Please let me know if this was done so we can call patient back. Thanks

## 2018-03-29 NOTE — Telephone Encounter (Signed)
Called patient and notified her that it appears that prescription was sent to [facility]. Patient to call drug store and will call back if they didn't receive it.

## 2018-04-19 ENCOUNTER — Encounter: Payer: Medicaid Other | Admitting: Nurse Practitioner

## 2018-05-04 ENCOUNTER — Encounter: Payer: Self-pay | Admitting: *Deleted

## 2018-05-04 ENCOUNTER — Emergency Department
Admission: EM | Admit: 2018-05-04 | Discharge: 2018-05-04 | Disposition: A | Discharge status: Home / Self Care | Payer: Medicaid Other | Attending: Emergency Medicine | Admitting: Emergency Medicine

## 2018-05-04 ENCOUNTER — Other Ambulatory Visit: Payer: Self-pay

## 2018-05-04 ENCOUNTER — Emergency Department: Payer: Medicaid Other

## 2018-05-04 DIAGNOSIS — I5032 Chronic diastolic (congestive) heart failure: Secondary | ICD-10-CM | POA: Insufficient documentation

## 2018-05-04 DIAGNOSIS — J449 Chronic obstructive pulmonary disease, unspecified: Secondary | ICD-10-CM | POA: Diagnosis not present

## 2018-05-04 DIAGNOSIS — R05 Cough: Secondary | ICD-10-CM | POA: Diagnosis present

## 2018-05-04 DIAGNOSIS — Z87891 Personal history of nicotine dependence: Secondary | ICD-10-CM | POA: Diagnosis not present

## 2018-05-04 DIAGNOSIS — E1140 Type 2 diabetes mellitus with diabetic neuropathy, unspecified: Secondary | ICD-10-CM | POA: Insufficient documentation

## 2018-05-04 DIAGNOSIS — J209 Acute bronchitis, unspecified: Secondary | ICD-10-CM | POA: Diagnosis not present

## 2018-05-04 DIAGNOSIS — I110 Hypertensive heart disease with heart failure: Secondary | ICD-10-CM | POA: Insufficient documentation

## 2018-05-04 DIAGNOSIS — Z79899 Other long term (current) drug therapy: Secondary | ICD-10-CM | POA: Insufficient documentation

## 2018-05-04 DIAGNOSIS — Z7984 Long term (current) use of oral hypoglycemic drugs: Secondary | ICD-10-CM | POA: Diagnosis not present

## 2018-05-04 DIAGNOSIS — I10 Essential (primary) hypertension: Secondary | ICD-10-CM

## 2018-05-04 LAB — CBC
HCT: 39.6 % (ref 36.0–46.0)
Hemoglobin: 12.8 g/dL (ref 12.0–15.0)
MCH: 28.3 pg (ref 26.0–34.0)
MCHC: 32.3 g/dL (ref 30.0–36.0)
MCV: 87.6 fL (ref 80.0–100.0)
Platelets: 340 10*3/uL (ref 150–400)
RBC: 4.52 MIL/uL (ref 3.87–5.11)
RDW: 20.4 % — ABNORMAL HIGH (ref 11.5–15.5)
WBC: 9 10*3/uL (ref 4.0–10.5)
nRBC: 0 % (ref 0.0–0.2)

## 2018-05-04 LAB — BASIC METABOLIC PANEL
Anion gap: 12 (ref 5–15)
BUN: 9 mg/dL (ref 6–20)
CO2: 21 mmol/L — ABNORMAL LOW (ref 22–32)
CREATININE: 0.74 mg/dL (ref 0.44–1.00)
Calcium: 9.4 mg/dL (ref 8.9–10.3)
Chloride: 105 mmol/L (ref 98–111)
GFR calc Af Amer: >60 mL/min (ref >60)
Glucose, Bld: 166 mg/dL — ABNORMAL HIGH (ref 70–99)
POTASSIUM: 3.4 mmol/L — AB (ref 3.5–5.1)
SODIUM: 138 mmol/L (ref 135–145)

## 2018-05-04 LAB — TROPONIN I

## 2018-05-04 MED ORDER — DILTIAZEM HCL ER COATED BEADS 120 MG PO CP24: 120.0000 mg | ORAL_CAPSULE | Freq: Every day | ORAL | 0 refills | Status: DC

## 2018-05-04 MED ORDER — PREDNISONE 20 MG PO TABS: 60.0000 mg | ORAL_TABLET | Freq: Every day | ORAL | 0 refills | Status: AC

## 2018-05-04 MED ORDER — MORPHINE SULFATE (PF) 2 MG/ML IV SOLN
2.0000 mg | Freq: Once | INTRAVENOUS | Status: AC
  Administered 2018-05-04: 2 mg via INTRAVENOUS
  Filled 2018-05-04: qty 1

## 2018-05-04 MED ORDER — DULOXETINE HCL 30 MG PO CPEP: 30.0000 mg | ORAL_CAPSULE | Freq: Every day | ORAL | 0 refills | Status: DC

## 2018-05-04 MED ORDER — FUROSEMIDE 40 MG PO TABS: 40.0000 mg | ORAL_TABLET | Freq: Every day | ORAL | 0 refills | Status: DC

## 2018-05-04 MED ORDER — PREDNISONE 20 MG PO TABS
60.0000 mg | ORAL_TABLET | Freq: Once | ORAL | Status: AC
  Administered 2018-05-04: 60 mg via ORAL
  Filled 2018-05-04: qty 3

## 2018-05-04 MED ORDER — METFORMIN HCL 500 MG PO TABS: 500.0000 mg | ORAL_TABLET | Freq: Two times a day (BID) | ORAL | 0 refills | Status: DC

## 2018-05-04 MED ORDER — AZITHROMYCIN 500 MG PO TABS
500.0000 mg | ORAL_TABLET | Freq: Once | ORAL | Status: AC
  Administered 2018-05-04: 500 mg via ORAL
  Filled 2018-05-04: qty 1

## 2018-05-04 MED ORDER — LOSARTAN POTASSIUM 100 MG PO TABS: 100.0000 mg | ORAL_TABLET | Freq: Every day | ORAL | 0 refills | Status: DC

## 2018-05-04 MED ORDER — ATORVASTATIN CALCIUM 80 MG PO TABS: 80.0000 mg | ORAL_TABLET | Freq: Every day | ORAL | 0 refills | Status: DC

## 2018-05-04 MED ORDER — PANTOPRAZOLE SODIUM 40 MG PO TBEC: 40.0000 mg | DELAYED_RELEASE_TABLET | Freq: Every day | ORAL | 0 refills | Status: AC

## 2018-05-04 MED ORDER — AZITHROMYCIN 500 MG PO TABS: 500.0000 mg | ORAL_TABLET | Freq: Every day | ORAL | 0 refills | Status: AC

## 2018-05-04 NOTE — ED Provider Notes (Signed)
Accepted from Dr. Manson Passey, patient with nonspecific chest pain, awaiting repeat troponin.  Repeat troponin negative.  Patient discharged with Dr. Theora Gianotti prepared discharge instructions.   Governor Rooks, MD 05/04/18 [phone]

## 2018-05-04 NOTE — ED Triage Notes (Signed)
Pt reports generalized chest pain for several days, worsening tonight, associated with SOB, cough, dizziness and back pain. Took tylenol for pain without relief.

## 2018-05-12 NOTE — ED Provider Notes (Signed)
[facility] Emergency Department Provider Note    First MD Initiated Contact with Patient 05/04/18 [phone]     (approximate)  I have reviewed the triage vital signs and the nursing notes.   HISTORY  Chief Complaint Chest Pain   HPI Erica [name] is a 52 y.o. female with below list of chronic medical conditions including COPD CHF presents to the emergency department with generalized chest discomfort times several days which patient states is worse at night.  Patient admits to dyspnea and cough that is nonproductive.  Patient states that she took Tylenol at home without any improvement of symptoms.  Patient states current pain score is 5 out of 10.   Past Medical History:  Diagnosis Date  . (HFpEF) heart failure with preserved ejection fraction ([place])    a. 05/2015 Echo: EF 60-65%, no rwma, PASP 54mHg.  . Arthritis   . Asthma   . Bell's palsy   . CHF (congestive heart failure) ([place]   . COPD (chronic obstructive pulmonary disease) ([place]   . Diabetes mellitus without complication ([place]    type II 03/2017  . Gastric ulcer   . Hyperlipidemia   . Hypertension   . OSA (obstructive sleep apnea)    a. did not tolerate CPAP.  .Marland KitchenShoulder injury    6/19    Patient Active Problem List   Diagnosis Date Noted  . Obesity (BMI 30.0-34.9) 12/05/2017  . Primary localized osteoarthrosis, pelvic region and thigh 12/05/2017  . Arthropathy of left hip 11/08/2017  . Rib pain on right side 11/08/2017  . Spondylosis without myelopathy or radiculopathy, lumbosacral region 10/06/2017  . Vertigo 09/15/2017  . Diabetes ([place] 07/18/2017  . HTN (hypertension) 07/18/2017  . Chronic hip pain (Secondary source of pain) (Bilateral) (L>R) 05/02/2017  . 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 Hemarthrosis, left knee 11/10/2016  . Osteoarthritis of knee (Bilateral) (L>R) 09/08/2016  . Intermittent left thoracic Muscle cramps 09/08/2016  . Spasm of thoracolumbar  muscle (Left) 09/08/2016  . Post-traumatic osteoarthritis of knee (Left) 09/08/2016  . Lumbar spondylosis 07/05/2016  . Chronic pain syndrome 06/28/2016  . Elevated sedimentation rate 06/28/2016  . Elevated C-reactive protein (CRP) 06/28/2016  . Neurogenic pain 06/28/2016  . Vitamin D deficiency 06/28/2016  . Long term current use of opiate analgesic 04/29/2016  . Long term prescription opiate use 04/29/2016  . Opiate use 04/29/2016  . Chronic low back pain (Primary Source of Pain) (Bilateral) (L>R) 04/29/2016  . Chronic knee pain ([place]) (Bilateral) (L>R) 04/29/2016  . Chronic neck pain (Bilateral) (R>L) 04/29/2016  . Chronic upper back pain (midline) 04/29/2016  . Chronic foot pain (bottom of feet) (Bilateral) (R>L) 04/29/2016  . Chronic hand pain (Bilateral) 04/29/2016  . Peripheral neuropathy, idiopathic (upper and lower extremity) 04/29/2016  . Chronic sacroiliac joint pain (Bilateral) (R>L) 04/29/2016  . Lumbar facet syndrome (Bilateral) (R>L) 04/29/2016  . Depression 03/05/2016  . Chronic diastolic heart failure ([place] 07/16/2015  . Smoker 07/16/2015  . Obstructive sleep apnea 07/16/2015  . Tachycardia 07/16/2015  . COPD, mild ([place]   . Renal insufficiency   . Pulmonary HTN ([place]   . Chest pain 06/14/2015  . Hyponatremia 06/14/2015  . Shortness of breath   . Swelling   . Cough   . COPD (chronic obstructive pulmonary disease) with acute bronchitis ([place]     Past Surgical History:  Procedure Laterality Date  . FLEXIBLE BRONCHOSCOPY N/A 06/20/2015   Procedure: FLEXIBLE BRONCHOSCOPY;  Surgeon: PCaffie Damme  Ashby Dawes, MD;  Location: [facility] ORS;  Service: Pulmonary;  Laterality: N/A;  . KNEE SURGERY Left    8 knee surgeries    Prior to Admission medications   Medication Sig Start Date End Date Taking? Authorizing Provider  albuterol (PROAIR HFA) 108 (90 Base) MCG/ACT inhaler Inhale into the lungs 4 (four) times daily.    [provider]  aspirin EC  81 MG EC tablet Take 1 tablet (81 mg total) by mouth daily. 06/23/15   Demetrios Loll, MD  atorvastatin (LIPITOR) 80 MG tablet Take 1 tablet (80 mg total) by mouth daily. 05/04/18   Gregor Hams, MD  Blood Glucose Monitoring Suppl (ACCU-CHEK AVIVA PLUS) w/Device KIT See admin instructions. 10/20/17   [provider]  Cholecalciferol (VITAMIN D3) 2000 units capsule Take 1 capsule (2,000 Units total) by mouth daily. 02/27/18   Vevelyn Francois, NP  diltiazem (CARDIZEM) 30 MG tablet Take 1 tablet (30 mg total) by mouth 3 (three) times daily as needed. 03/23/17   Minna Merritts, MD  diltiazem (CARTIA XT) 120 MG 24 hr capsule Take 1 capsule (120 mg total) by mouth daily. 05/04/18   Gregor Hams, MD  DULoxetine (CYMBALTA) 30 MG capsule Take 1 capsule (30 mg total) by mouth daily. 05/04/18   Gregor Hams, MD  DULoxetine (CYMBALTA) 60 MG capsule Take 60 mg by mouth daily.    [provider]  fluconazole (DIFLUCAN) 150 MG tablet TAKE 1 TABLET NOW FOR YEAST INFECTION, MAY REPEAT IN 3 DAYS IF SYMPTOMS PERSIST 10/17/17   [provider]  Fluticasone-Salmeterol (ADVAIR DISKUS IN) Inhale into the lungs 2 (two) times daily.    [provider]  folic acid (FOLVITE) 1 MG tablet Take 1 mg by mouth daily.    [provider]  furosemide (LASIX) 40 MG tablet Take 1 tablet (40 mg total) by mouth daily. Take an additional 40 mg in the am for weight gain as needed 05/04/18   Gregor Hams, MD  losartan (COZAAR) 100 MG tablet Take 1 tablet (100 mg total) by mouth daily. Take 1/2 tablet (50 mg) by mouth twice daily 05/04/18   Gregor Hams, MD  Magnesium Oxide, Antacid, 500 MG CAPS once daily. 11/08/16   [provider]  metFORMIN (GLUCOPHAGE) 500 MG tablet Take 1 tablet (500 mg total) by mouth 2 (two) times daily with a meal. 05/04/18   Gregor Hams, MD  oxyCODONE (OXY IR/ROXICODONE) 5 MG immediate release tablet Take 1 tablet (5 mg total) by mouth every 8  (eight) hours as needed for severe pain. 02/27/18 03/29/18  Vevelyn Francois, NP  oxyCODONE (OXY IR/ROXICODONE) 5 MG immediate release tablet Take 1 tablet (5 mg total) by mouth every 8 (eight) hours as needed for severe pain. 03/29/18 04/28/18  Vevelyn Francois, NP  OXYGEN Inhale into the lungs. 2 liters at bedtime    [provider]  pantoprazole (PROTONIX) 40 MG tablet Take 1 tablet (40 mg total) by mouth daily. 05/04/18   Gregor Hams, MD  potassium chloride SA (K-DUR,KLOR-CON) 20 MEQ tablet Take 20 mEq by mouth 2 (two) times daily.    [provider]  pregabalin (LYRICA) 75 MG capsule Take 1 capsule (75 mg total) by mouth 3 (three) times daily. 02/27/18 04/28/18  Vevelyn Francois, NP  RA MAGNESIUM 500 MG CAPS Take 1 capsule by mouth 2 (two) times daily. 01/24/18   [provider]  sucralfate (CARAFATE) 1 G tablet Take 1 tablet (  1 g total) by mouth 4 (four) times daily -  with meals and at bedtime. Patient taking differently: Take 1 g by mouth 2 (two) times daily.  06/23/15   Demetrios Loll, MD  tiotropium (SPIRIVA HANDIHALER) 18 MCG inhalation capsule Place 18 mcg into inhaler and inhale daily.    [provider]  tiZANidine (ZANAFLEX) 4 MG capsule Take 1 capsule (4 mg total) by mouth 3 (three) times daily as needed for muscle spasms. 02/27/18 03/29/18  Vevelyn Francois, NP    Allergies Tramadol and Penicillins  Family History  Problem Relation Age of Onset  . Lung cancer Father   . Breast cancer Mother        early 21's  . Diabetes Maternal Grandmother     Social History Social History   Tobacco Use  . Smoking status: Former Smoker    Packs/day: 1.00    Years: 36.00    Pack years: 36.00    Types: Cigarettes    Last attempt to quit: 06/13/2015    Years since quitting: 2.9  . Smokeless tobacco: Never Used  Substance Use Topics  . Alcohol use: No    Alcohol/week: 0.0 standard drinks    Frequency: Never    Comment: occasionaly  . Drug use: No     Review of Systems Constitutional: No fever/chills Eyes: No visual changes. ENT: No sore throat. Cardiovascular: Positive for chest pain Respiratory: Positive for cough and dyspnea Gastrointestinal: No abdominal pain.  No nausea, no vomiting.  No diarrhea.  No constipation. Genitourinary: Negative for dysuria. Musculoskeletal: Negative for neck pain.  Negative for back pain. Integumentary: Negative for rash. Neurological: Negative for headaches, focal weakness or numbness.   ____________________________________________   PHYSICAL EXAM:  VITAL SIGNS: ED Triage Vitals [05/04/18 0336]  Enc Vitals Group     BP (!) 180/88     Pulse Rate (!) 109     Resp 20     Temp 98.7 F (37.1 C)     Temp Source Oral     SpO2 94 %     Weight      Height      Head Circumference      Peak Flow      Pain Score 8     Pain Loc      Pain Edu?      Excl. in [place]?     Constitutional: Alert and oriented. Well appearing and in no acute distress. Eyes: Conjunctivae are normal. Head: Atraumatic. Mouth/Throat: Mucous membranes are moist.  Oropharynx non-erythematous. Neck: No stridor. Cardiovascular: Normal rate, regular rhythm. Good peripheral circulation. Grossly normal heart sounds. Respiratory: Normal respiratory effort.  No retractions. Lungs CTAB. Gastrointestinal: Soft and nontender. No distention.  Musculoskeletal: No lower extremity tenderness nor edema. No gross deformities of extremities. Neurologic:  Normal speech and language. No gross focal neurologic deficits are appreciated.  Skin:  Skin is warm, dry and intact. No rash noted. Psychiatric: Mood and affect are normal. Speech and behavior are normal.  ____________________________________________   LABS (all labs ordered are listed, but only abnormal results are displayed)  Labs Reviewed  BASIC METABOLIC PANEL - Abnormal; Notable for the following components:      Result Value   Potassium 3.4 (*)    CO2 21 (*)    Glucose,  Bld 166 (*)    All other components within normal limits  CBC - Abnormal; Notable for the following components:   RDW 20.4 (*)    All other components within  normal limits  TROPONIN I  TROPONIN I   ____________________________________________  EKG  ED ECG REPORT I, [place] N BROWN, the attending physician, personally viewed and interpreted this ECG.   Date: 05/12/2018  EKG Time: 3:34 AM  Rate: 113  Rhythm: Sinus tachycardia  Axis: Normal  Intervals: Normal  ST&T Change: None  ____________________________________________  RADIOLOGY I, [place] N BROWN, personally viewed and evaluated these images (plain radiographs) as part of my medical decision making, as well as reviewing the written report by the radiologist.  ED MD interpretation: Chest x-ray revealed no active cardiopulmonary disease per radiologist. Official radiology report(s): No results found.    Procedures   ____________________________________________   INITIAL IMPRESSION / ASSESSMENT AND PLAN / ED COURSE  As part of my medical decision making, I reviewed the following data within the electronic MEDICAL RECORD NUMBER   52 year old female presenting with above-stated history and physical exam secondary to cough chest discomfort and dyspnea.  Considered possibly of ACS and as such EKG was performed which revealed no evidence of ischemia or infarction.  Troponin obtained which was also negative second troponin pending at this time.  I suspect the patient's symptoms to be secondary to acute on chronic bronchitis as patient states that discomfort occurs with coughing.  Patient given azithromycin and prednisone in the emergency department will be prescribed same for home.  Patient also given a dose of IV morphine 2 mg with resolution of discomfort.  Patient is care transferred to Dr. Reita Cliche anticipate second troponin to be negative and if so patient should be discharged home with outpatient  follow-up ____________________________________________  FINAL CLINICAL IMPRESSION(S) / ED DIAGNOSES  Final diagnoses:  Acute bronchitis, unspecified organism     MEDICATIONS GIVEN DURING THIS VISIT:  Medications  morphine 2 MG/ML injection 2 mg (2 mg Intravenous Given 05/04/18 0427)  azithromycin (ZITHROMAX) tablet 500 mg (500 mg Oral Given 05/04/18 0426)  predniSONE (DELTASONE) tablet 60 mg (60 mg Oral Given 05/04/18 0426)     ED Discharge Orders         Ordered    atorvastatin (LIPITOR) 80 MG tablet  Daily     05/04/18 0539    diltiazem (CARTIA XT) 120 MG 24 hr capsule  Daily     05/04/18 0539    DULoxetine (CYMBALTA) 30 MG capsule  Daily     05/04/18 0539    metFORMIN (GLUCOPHAGE) 500 MG tablet  2 times daily with meals     05/04/18 0539    losartan (COZAAR) 100 MG tablet  Daily     05/04/18 0539    furosemide (LASIX) 40 MG tablet  Daily     05/04/18 0539    pantoprazole (PROTONIX) 40 MG tablet  Daily     05/04/18 0539    azithromycin (ZITHROMAX) 500 MG tablet  Daily     05/04/18 0542    predniSONE (DELTASONE) 20 MG tablet  Daily     05/04/18 0800           Note:  This document was prepared using Dragon voice recognition software and may include unintentional dictation errors.    Gregor Hams, MD 05/12/18 1200

## 2018-05-16 ENCOUNTER — Other Ambulatory Visit: Payer: Self-pay

## 2018-05-16 ENCOUNTER — Ambulatory Visit: Payer: Medicaid Other | Attending: Nurse Practitioner | Admitting: Nurse Practitioner

## 2018-05-16 ENCOUNTER — Encounter: Payer: Self-pay | Admitting: Nurse Practitioner

## 2018-05-16 ENCOUNTER — Telehealth: Payer: Self-pay | Admitting: Nurse Practitioner

## 2018-05-16 ENCOUNTER — Telehealth: Payer: Self-pay

## 2018-05-16 VITALS — BP 124/60 | HR 80 | Temp 98.1°F | Resp 16 | Ht 68.0 in | Wt 200.0 lb

## 2018-05-16 DIAGNOSIS — E1142 Type 2 diabetes mellitus with diabetic polyneuropathy: Secondary | ICD-10-CM | POA: Insufficient documentation

## 2018-05-16 DIAGNOSIS — G609 Hereditary and idiopathic neuropathy, unspecified: Secondary | ICD-10-CM

## 2018-05-16 DIAGNOSIS — M47816 Spondylosis without myelopathy or radiculopathy, lumbar region: Secondary | ICD-10-CM | POA: Diagnosis not present

## 2018-05-16 DIAGNOSIS — E785 Hyperlipidemia, unspecified: Secondary | ICD-10-CM | POA: Diagnosis not present

## 2018-05-16 DIAGNOSIS — E871 Hypo-osmolality and hyponatremia: Secondary | ICD-10-CM | POA: Diagnosis not present

## 2018-05-16 DIAGNOSIS — M62830 Muscle spasm of back: Secondary | ICD-10-CM | POA: Diagnosis not present

## 2018-05-16 DIAGNOSIS — M79671 Pain in right foot: Secondary | ICD-10-CM | POA: Insufficient documentation

## 2018-05-16 DIAGNOSIS — J449 Chronic obstructive pulmonary disease, unspecified: Secondary | ICD-10-CM | POA: Insufficient documentation

## 2018-05-16 DIAGNOSIS — E559 Vitamin D deficiency, unspecified: Secondary | ICD-10-CM | POA: Insufficient documentation

## 2018-05-16 DIAGNOSIS — M1732 Unilateral post-traumatic osteoarthritis, left knee: Secondary | ICD-10-CM

## 2018-05-16 DIAGNOSIS — Z79891 Long term (current) use of opiate analgesic: Secondary | ICD-10-CM | POA: Diagnosis not present

## 2018-05-16 DIAGNOSIS — M170 Bilateral primary osteoarthritis of knee: Secondary | ICD-10-CM | POA: Insufficient documentation

## 2018-05-16 DIAGNOSIS — Z7984 Long term (current) use of oral hypoglycemic drugs: Secondary | ICD-10-CM | POA: Insufficient documentation

## 2018-05-16 DIAGNOSIS — Z7982 Long term (current) use of aspirin: Secondary | ICD-10-CM | POA: Insufficient documentation

## 2018-05-16 DIAGNOSIS — M79672 Pain in left foot: Secondary | ICD-10-CM | POA: Diagnosis not present

## 2018-05-16 DIAGNOSIS — F329 Major depressive disorder, single episode, unspecified: Secondary | ICD-10-CM | POA: Diagnosis not present

## 2018-05-16 DIAGNOSIS — N289 Disorder of kidney and ureter, unspecified: Secondary | ICD-10-CM | POA: Insufficient documentation

## 2018-05-16 DIAGNOSIS — M79642 Pain in left hand: Secondary | ICD-10-CM | POA: Insufficient documentation

## 2018-05-16 DIAGNOSIS — M160 Bilateral primary osteoarthritis of hip: Secondary | ICD-10-CM | POA: Diagnosis not present

## 2018-05-16 DIAGNOSIS — G4733 Obstructive sleep apnea (adult) (pediatric): Secondary | ICD-10-CM | POA: Diagnosis not present

## 2018-05-16 DIAGNOSIS — M792 Neuralgia and neuritis, unspecified: Secondary | ICD-10-CM | POA: Diagnosis not present

## 2018-05-16 DIAGNOSIS — M542 Cervicalgia: Secondary | ICD-10-CM | POA: Diagnosis not present

## 2018-05-16 DIAGNOSIS — M545 Low back pain: Secondary | ICD-10-CM | POA: Insufficient documentation

## 2018-05-16 DIAGNOSIS — M47897 Other spondylosis, lumbosacral region: Secondary | ICD-10-CM | POA: Insufficient documentation

## 2018-05-16 DIAGNOSIS — Z5181 Encounter for therapeutic drug level monitoring: Secondary | ICD-10-CM | POA: Insufficient documentation

## 2018-05-16 DIAGNOSIS — I110 Hypertensive heart disease with heart failure: Secondary | ICD-10-CM | POA: Diagnosis not present

## 2018-05-16 DIAGNOSIS — M79641 Pain in right hand: Secondary | ICD-10-CM | POA: Diagnosis not present

## 2018-05-16 DIAGNOSIS — G894 Chronic pain syndrome: Secondary | ICD-10-CM | POA: Diagnosis present

## 2018-05-16 DIAGNOSIS — I5032 Chronic diastolic (congestive) heart failure: Secondary | ICD-10-CM | POA: Diagnosis not present

## 2018-05-16 DIAGNOSIS — Z8719 Personal history of other diseases of the digestive system: Secondary | ICD-10-CM | POA: Insufficient documentation

## 2018-05-16 DIAGNOSIS — M533 Sacrococcygeal disorders, not elsewhere classified: Secondary | ICD-10-CM | POA: Diagnosis not present

## 2018-05-16 DIAGNOSIS — I272 Pulmonary hypertension, unspecified: Secondary | ICD-10-CM | POA: Insufficient documentation

## 2018-05-16 DIAGNOSIS — R252 Cramp and spasm: Secondary | ICD-10-CM

## 2018-05-16 DIAGNOSIS — Z888 Allergy status to other drugs, medicaments and biological substances status: Secondary | ICD-10-CM | POA: Insufficient documentation

## 2018-05-16 DIAGNOSIS — Z87891 Personal history of nicotine dependence: Secondary | ICD-10-CM | POA: Insufficient documentation

## 2018-05-16 DIAGNOSIS — Z880 Allergy status to penicillin: Secondary | ICD-10-CM | POA: Insufficient documentation

## 2018-05-16 DIAGNOSIS — Z79899 Other long term (current) drug therapy: Secondary | ICD-10-CM | POA: Insufficient documentation

## 2018-05-16 MED ORDER — PREGABALIN 75 MG PO CAPS
75.0000 mg | ORAL_CAPSULE | Freq: Three times a day (TID) | ORAL | 0 refills | Status: DC
Start: 2018-05-16 — End: 2018-06-13

## 2018-05-16 MED ORDER — OXYCODONE HCL 5 MG PO TABS: 5.0000 mg | ORAL_TABLET | Freq: Three times a day (TID) | ORAL | 0 refills | Status: DC | PRN

## 2018-05-16 MED ORDER — TIZANIDINE HCL 4 MG PO CAPS: 4.0000 mg | ORAL_CAPSULE | Freq: Three times a day (TID) | ORAL | 0 refills | Status: DC | PRN

## 2018-05-16 NOTE — Telephone Encounter (Signed)
Patient needs Prior Auth for Oxycodone, and tizanidine needs changed to tablets because of insurance.

## 2018-05-16 NOTE — Telephone Encounter (Signed)
Pharmacy needs call back regarding RX, Insurance not paying for script, they to to change script ? Tabs .Call to confirm.

## 2018-05-16 NOTE — Telephone Encounter (Signed)
Contacted pharmacy, authorized Tizanidine change from caps to tabs.  PA request for Oxycodone submitted.

## 2018-05-16 NOTE — Progress Notes (Signed)
Nursing Pain Medication Assessment:  Safety precautions to be maintained throughout the outpatient stay will include: orient to surroundings, keep bed in low position, maintain call bell within reach at all times, provide assistance with transfer out of bed and ambulation.  Medication Inspection Compliance: Pill count conducted under aseptic conditions, in front of the patient. Neither the pills nor the bottle was removed from the patient's sight at any time. Once count was completed pills were immediately returned to the patient in their original bottle.  Medication: See above Pill/Patch Count: 0 of 90 pills remain Pill/Patch Appearance: Markings consistent with prescribed medication Bottle Appearance: Standard pharmacy container. Clearly labeled. Filled Date: 80 / 11 / 2019 Last Medication intake:  Ran out of medicine more than 48 hours ago

## 2018-05-16 NOTE — Telephone Encounter (Signed)
Ok to use tablet instead of capsule Ok the nurses will work on the [place]

## 2018-05-16 NOTE — Patient Instructions (Signed)
____________________________________________________________________________________________  Medication Rules  Applies to: All patients receiving prescriptions (written or electronic).  Pharmacy of record: Pharmacy where electronic prescriptions will be sent. If written prescriptions are taken to a different pharmacy, please inform the nursing staff. The pharmacy listed in the electronic medical record should be the one where you would like electronic prescriptions to be sent.  Prescription refills: Only during scheduled appointments. Applies to both, written and electronic prescriptions.  NOTE: The following applies primarily to controlled substances (Opioid* Pain Medications).   Patient's responsibilities: 1. Pain Pills: Bring all pain pills to every appointment (except for procedure appointments). 2. Pill Bottles: Bring pills in original pharmacy bottle. Always bring newest bottle. Bring bottle, even if empty. 3. Medication refills: You are responsible for knowing and keeping track of what medications you need refilled. The day before your appointment, write a list of all prescriptions that need to be refilled. Bring that list to your appointment and give it to the admitting nurse. Prescriptions will be written only during appointments. If you forget a medication, it will not be "Called in", "Faxed", or "electronically sent". You will need to get another appointment to get these prescribed. 4. Prescription Accuracy: You are responsible for carefully inspecting your prescriptions before leaving our office. Have the discharge nurse carefully go over each prescription with you, before taking them home. Make sure that your name is accurately spelled, that your address is correct. Check the name and dose of your medication to make sure it is accurate. Check the number of pills, and the written instructions to make sure they are clear and accurate. Make sure that you are given enough medication to last  until your next medication refill appointment. 5. Taking Medication: Take medication as prescribed. Never take more pills than instructed. Never take medication more frequently than prescribed. Taking less pills or less frequently is permitted and encouraged, when it comes to controlled substances (written prescriptions).  6. Inform other Doctors: Always inform, all of your healthcare providers, of all the medications you take. 7. Pain Medication from other Providers: You are not allowed to accept any additional pain medication from any other Doctor or Healthcare provider. There are two exceptions to this rule. (see below) In the event that you require additional pain medication, you are responsible for notifying us, as stated below. 8. Medication Agreement: You are responsible for carefully reading and following our Medication Agreement. This must be signed before receiving any prescriptions from our practice. Safely store a copy of your signed Agreement. Violations to the Agreement will result in no further prescriptions. (Additional copies of our Medication Agreement are available upon request.) 9. Laws, Rules, & Regulations: All patients are expected to follow all Federal and State Laws, Statutes, Rules, & Regulations. Ignorance of the Laws does not constitute a valid excuse. The use of any illegal substances is prohibited. 10. Adopted CDC guidelines & recommendations: Target dosing levels will be at or below 60 MME/day. Use of benzodiazepines** is not recommended.  Exceptions: There are only two exceptions to the rule of not receiving pain medications from other Healthcare Providers. 1. Exception #1 (Emergencies): In the event of an emergency (i.e.: accident requiring emergency care), you are allowed to receive additional pain medication. However, you are responsible for: As soon as you are able, call our office [phone], at any time of the day or night, and leave a message stating your name, the  date and nature of the emergency, and the name and dose of the medication   prescribed. In the event that your call is answered by a member of our staff, make sure to document and save the date, time, and the name of the person that took your information.  2. Exception #2 (Planned Surgery): In the event that you are scheduled by another doctor or dentist to have any type of surgery or procedure, you are allowed (for a period no longer than 30 days), to receive additional pain medication, for the acute post-op pain. However, in this case, you are responsible for picking up a copy of our "Post-op Pain Management for Surgeons" handout, and giving it to your surgeon or dentist. This document is available at our office, and does not require an appointment to obtain it. Simply go to our office during business hours (Monday-Thursday from 8:00 AM to 4:00 PM) (Friday 8:00 AM to 12:00 Noon) or if you have a scheduled appointment with us, prior to your surgery, and ask for it by name. In addition, you will need to provide us with your name, name of your surgeon, type of surgery, and date of procedure or surgery.  *Opioid medications include: morphine, codeine, oxycodone, oxymorphone, hydrocodone, hydromorphone, meperidine, tramadol, tapentadol, buprenorphine, fentanyl, methadone. **Benzodiazepine medications include: diazepam (Valium), alprazolam (Xanax), clonazepam (Klonopine), lorazepam (Ativan), clorazepate (Tranxene), chlordiazepoxide (Librium), estazolam (Prosom), oxazepam (Serax), temazepam (Restoril), triazolam (Halcion) (Last updated: 09/15/2017) ____________________________________________________________________________________________   ____________________________________________________________________________________________  Appointment Policy Summary  It is our goal and responsibility to provide the medical community with assistance in the evaluation and management of patients with chronic pain.  Unfortunately our resources are limited. Because we do not have an unlimited amount of time, or available appointments, we are required to closely monitor and manage their use. The following rules exist to maximize their use:  Patient's responsibilities: 1. Punctuality:  At what time should I arrive? You should be physically present in our office 30 minutes before your scheduled appointment. Your scheduled appointment is with your assigned healthcare provider. However, it takes 5-10 minutes to be "checked-in", and another 15 minutes for the nurses to do the admission. If you arrive to our office at the time you were given for your appointment, you will end up being at least 20-25 minutes late to your appointment with the provider. 2. Tardiness:  What happens if I arrive only a few minutes after my scheduled appointment time? You will need to reschedule your appointment. The cutoff is your appointment time. This is why it is so important that you arrive at least 30 minutes before that appointment. If you have an appointment scheduled for 10:00 AM and you arrive at 10:01, you will be required to reschedule your appointment.  3. Plan ahead:  Always assume that you will encounter traffic on your way in. Plan for it. If you are dependent on a driver, make sure they understand these rules and the need to arrive early. 4. Other appointments and responsibilities:  Avoid scheduling any other appointments before or after your pain clinic appointments.  5. Be prepared:  Write down everything that you need to discuss with your healthcare provider and give this information to the admitting nurse. Write down the medications that you will need refilled. Bring your pills and bottles (even the empty ones), to all of your appointments, except for those where a procedure is scheduled. 6. No children or pets:  Find someone to take care of them. It is not appropriate to bring them in. 7. Scheduling changes:  We request  "advanced notification" of any changes or   cancellations. 8. Advanced notification:  Defined as a time period of more than 24 hours prior to the originally scheduled appointment. This allows for the appointment to be offered to other patients. 9. Rescheduling:  When a visit is rescheduled, it will require the cancellation of the original appointment. For this reason they both fall within the category of "Cancellations".  10. Cancellations:  They require advanced notification. Any cancellation less than 24 hours before the  appointment will be recorded as a "No Show". 11. No Show:  Defined as an unkept appointment where the patient failed to notify or declare to the practice their intention or inability to keep the appointment.  Corrective process for repeat offenders:  1. Tardiness: Three (3) episodes of rescheduling due to late arrivals will be recorded as one (1) "No Show". 2. Cancellation or reschedule: Three (3) cancellations or rescheduling will be recorded as one (1) "No Show". 3. "No Shows": Three (3) "No Shows" within a 12 month period will result in discharge from the practice. ____________________________________________________________________________________________   

## 2018-05-16 NOTE — Progress Notes (Signed)
Patient's Name: Erica [name]  MRN: [id number]  Referring Provider: Denton Lank, MD  DOB: [birth year]  PCP: Denton Lank, MD  DOS: 05/16/2018  Note by: Vevelyn Francois NP  Service setting: Ambulatory outpatient  Specialty: Interventional Pain Management  Location: [facility] (AMB) Pain Management Facility    Patient type: Established    Primary Reason(s) for Visit: Encounter for prescription drug management. (Level of risk: moderate)  CC: Back Pain and Hip Pain (left)  HPI  Erica [name] is a 51 y.o. year old, female patient, who comes today for a medication management evaluation. She has Chest pain; Hyponatremia; Shortness of breath; Swelling; Cough; COPD (chronic obstructive pulmonary disease) with acute bronchitis ([place]); Renal insufficiency; Pulmonary HTN ([place]); COPD, mild ([place]); Chronic diastolic heart failure ([place]); Smoker; Obstructive sleep apnea; Tachycardia; Depression; Long term current use of opiate analgesic; Long term prescription opiate use; Opiate use; Chronic low back pain (Primary Source of Pain) (Bilateral) (L>R); Chronic knee pain [facility] source of pain) (Bilateral) (L>R); Chronic neck pain (Bilateral) (R>L); Chronic upper back pain (midline); Chronic foot pain (bottom of feet) (Bilateral) (R>L); Chronic hand pain (Bilateral); Peripheral neuropathy, idiopathic (upper and lower extremity); Chronic sacroiliac joint pain (Bilateral) (R>L); Lumbar facet syndrome (Bilateral) (R>L); Chronic pain syndrome; Elevated sedimentation rate; Elevated C-reactive protein (CRP); Neurogenic pain; Vitamin D deficiency; Lumbar spondylosis; Osteoarthritis of knee (Bilateral) (L>R); Intermittent left thoracic Muscle cramps; Spasm of thoracolumbar muscle (Left); Post-traumatic osteoarthritis of knee (Left); Hemarthrosis, left knee; 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); Chronic hip pain (Secondary source of pain) (Bilateral) (L>R); Diabetes ([place]); HTN (hypertension); Vertigo; Spondylosis without myelopathy or  radiculopathy, lumbosacral region; Arthropathy of left hip; Rib pain on right side; Obesity (BMI 30.0-34.9); and Primary localized osteoarthrosis, pelvic region and thigh on their problem list. Her primarily concern today is the Back Pain and Hip Pain (left)  Pain Assessment: Location: Lower Back Radiating: hip/buttocks down back of leg, left foot drops Duration: Chronic pain Quality: Constant, Aching Severity: 5 /10 (subjective, self-reported pain score)  Note: Reported level is compatible with observation. Clinically the patient looks like a 0/10              Effect on ADL: "Cleaning" bending+ Timing: Constant Modifying factors: moving, streching, medication BP: 124/60  HR: 80  Erica [name] was last scheduled for an appointment on 04/19/2018 for medication management. During today's appointment we reviewed Erica [name]'s chronic pain status, as well as her outpatient medication regimen. She states the foot drops only after lying on side, sitting or standing for long periods of time. She admits that once the numbness wears off of her legs then she is able to walk. She admits that her pain is increased because she has been out of her medications.  She admits that this was related to her insurance again.  She admits that she did have some nausea.  She admits she always has diarrhea.  She admits that she may have been a little more irritable but denies any other symptoms.  The patient  reports that she does not use drugs. Her body mass index is 30.41 kg/m.  Further details on both, my assessment(s), as well as the proposed treatment plan, please see below.  Controlled Substance Pharmacotherapy Assessment REMS (Risk Evaluation and Mitigation Strategy)  Analgesic:Oxycodone IR 5 mg 1 tablet by mouth every 8 hours (67m/dayof oxycodone) (22.5MME/day) MME/day:22.537mday.  GaIgnatius SpeckingRN  05/16/2018 11:22 AM  Sign at close encounter Nursing Pain Medication Assessment:  Safety  precautions to be maintained throughout  the outpatient stay will include: orient to surroundings, keep bed in low position, maintain call bell within reach at all times, provide assistance with transfer out of bed and ambulation.  Medication Inspection Compliance: Pill count conducted under aseptic conditions, in front of the patient. Neither the pills nor the bottle was removed from the patient's sight at any time. Once count was completed pills were immediately returned to the patient in their original bottle.  Medication: See above Pill/Patch Count: 0 of 90 pills remain Pill/Patch Appearance: Markings consistent with prescribed medication Bottle Appearance: Standard pharmacy container. Clearly labeled. Filled Date: 23 / 11 / 2019 Last Medication intake:  Ran out of medicine more than 48 hours ago   Pharmacokinetics: Liberation and absorption (onset of action): WNL Distribution (time to peak effect): WNL Metabolism and excretion (duration of action): WNL         Pharmacodynamics: Desired effects: Analgesia: Erica [name] reports >50% benefit. Functional ability: Patient reports that medication allows her to accomplish basic ADLs Clinically meaningful improvement in function (CMIF): Sustained CMIF goals met Perceived effectiveness: Described as relatively effective, allowing for increase in activities of daily living (ADL) Undesirable effects: Side-effects or Adverse reactions: None reported Monitoring: [place] PMP: Online review of the past 71-monthperiod conducted. Compliant with practice rules and regulations Last UDS on record: Summary  Date Value Ref Range Status  09/14/2017 FINAL  Final    Comment:    ==================================================================== TOXASSURE SELECT 13 (MW) ==================================================================== Test                             Result       Flag       Units Drug Absent but Declared for Prescription Verification    Oxycodone                      Not Detected UNEXPECTED ng/mg creat ==================================================================== Test                      Result    Flag   Units      Ref Range   Creatinine              35               mg/dL      >=20 ==================================================================== Declared Medications:  The flagging and interpretation on this report are based on the  following declared medications.  Unexpected results may arise from  inaccuracies in the declared medications.  **Note: The testing scope of this panel includes these medications:  Oxycodone  **Note: The testing scope of this panel does not include following  reported medications:  Albuterol  Aspirin (Aspirin 81)  Atorvastatin  Diltiazem  Duloxetine  Fluticasone  Folic acid  Furosemide  Losartan (Losartan Potassium)  Magnesium Oxide  Metformin  Oxygen  Pantoprazole  Potassium  Pregabalin  Salmeterol  Sucralfate  Tiotropium  Tizanidine  Vitamin D3 ==================================================================== For clinical consultation, please call ([phone] ====================================================================    UDS interpretation: Non-Compliant In this case, absence of medication is secondary to missed apt Medication Assessment Form: Discrepancies found between patient's report and information collected Treatment compliance: Non-compliant Risk Assessment Profile: Aberrant behavior: failure to bring medications for pill counts, missing appointments for alternative therapies and non-compliance with practice rules and regulations Comorbid factors increasing risk of overdose: age [id number] old, caucasian, COPD or asthma and kidney disease Opioid risk tool (ORT) (Total  Score): (P) 0 Personal History of Substance Abuse (SUD-Substance use disorder):  Alcohol: (P) Negative  Illegal Drugs: (P) Negative  Rx Drugs: (P) Negative  ORT Risk  Level calculation: (P) Low Risk Risk of substance use disorder (SUD): Moderate Opioid Risk Tool - 05/16/18 1121      Personal History of Substance Abuse   Alcohol  Negative  (Pended)     Illegal Drugs  Negative  (Pended)     Rx Drugs  Negative  (Pended)       Psychological Disease   Psychological Disease  Negative  (Pended)     Depression  Negative  (Pended)       Total Score   Opioid Risk Tool Scoring  0  (Pended)     Opioid Risk Interpretation  Low Risk  (Pended)       ORT Scoring interpretation table:  Score <3 = Low Risk for SUD  Score between 4-7 = Moderate Risk for SUD  Score >8 = High Risk for Opioid Abuse   Risk Mitigation Strategies:  Patient Counseling: Covered Patient-Prescriber Agreement (PPA): Present and active  Notification to other healthcare providers: Done  Pharmacologic Plan: Therapy adjustment: Monthly monitoring             Laboratory Chemistry  Inflammation Markers (CRP: Acute Phase) (ESR: Chronic Phase) Lab Results  Component Value Date   CRP 1.3 (H) 04/30/2016   ESRSEDRATE 31 (H) 04/30/2016                         Rheumatology Markers Lab Results  Component Value Date   RF <10.0 06/29/2016   ANA Negative 06/29/2016   LABURIC 7.1 (H) 06/19/2015                        Renal Function Markers Lab Results  Component Value Date   BUN 9 05/04/2018   CREATININE 0.74 05/04/2018   BCR 15 04/22/2016   GFRAA >60 05/04/2018   GFRNONAA >60 05/04/2018                             Hepatic Function Markers Lab Results  Component Value Date   AST 24 02/06/2018   ALT 15 02/06/2018   ALBUMIN 3.7 02/06/2018   ALKPHOS 72 02/06/2018   LIPASE 90 02/10/2013                        Electrolytes Lab Results  Component Value Date   NA 138 05/04/2018   K 3.4 (L) 05/04/2018   CL 105 05/04/2018   CALCIUM 9.4 05/04/2018   MG 1.9 04/30/2016                        Neuropathy Markers Lab Results  Component Value Date   VITAMINB12 248 04/30/2016    HGBA1C 6.2 (H) 06/14/2015   HIV NON REACTIVE 06/14/2015                        CNS Tests No results found for: COLORCSF, APPEARCSF, RBCCOUNTCSF, WBCCSF, POLYSCSF, LYMPHSCSF, EOSCSF, PROTEINCSF, GLUCCSF, JCVIRUS, CSFOLI, IGGCSF                      Bone Pathology Markers Lab Results  Component Value Date   25OHVITD1 15 (L) 04/30/2016   25OHVITD2 <1.0 04/30/2016  25OHVITD3 15 04/30/2016                         Coagulation Parameters Lab Results  Component Value Date   INR 0.96 02/06/2018   LABPROT 12.7 02/06/2018   APTT 35 02/06/2018   PLT 340 05/04/2018                        Cardiovascular Markers Lab Results  Component Value Date   BNP 42.0 06/14/2016   CKTOTAL 56 03/10/2013   CKMB 0.7 03/10/2013   TROPONINI <0.03 05/04/2018   HGB 12.8 05/04/2018   HCT 39.6 05/04/2018                         CA Markers No results found for: CEA, CA125, LABCA2                      Note: Lab results reviewed.  Recent Diagnostic Imaging Results  DG Chest 2 View CLINICAL DATA:  Generalized chest pain for several days, worse tonight. Shortness of breath, cough, dizziness, and back pain.  EXAM: CHEST - 2 VIEW  COMPARISON:  04/08/2017  FINDINGS: The heart size and mediastinal contours are within normal limits. Both lungs are clear. The visualized skeletal structures are unremarkable.  IMPRESSION: No active cardiopulmonary disease.  Electronically Signed   By: Lucienne Capers M.D.   On: 05/04/2018 03:55  Complexity Note: Imaging results reviewed. Results shared with Erica [name], using [employer].                         Meds   Current Outpatient Medications:  .  albuterol (PROAIR HFA) 108 (90 Base) MCG/ACT inhaler, Inhale into the lungs 4 (four) times daily., Disp: , Rfl:  .  aspirin EC 81 MG EC tablet, Take 1 tablet (81 mg total) by mouth daily., Disp: 30 tablet, Rfl: 0 .  atorvastatin (LIPITOR) 80 MG tablet, Take 1 tablet (80 mg total) by mouth daily., Disp: 30  tablet, Rfl: 0 .  Blood Glucose Monitoring Suppl (ACCU-CHEK AVIVA PLUS) w/Device KIT, See admin instructions., Disp: , Rfl: 0 .  Cholecalciferol (VITAMIN D3) 2000 units capsule, Take 1 capsule (2,000 Units total) by mouth daily., Disp: 30 capsule, Rfl: PRN .  diltiazem (CARDIZEM) 30 MG tablet, Take 1 tablet (30 mg total) by mouth 3 (three) times daily as needed., Disp: 90 tablet, Rfl: 3 .  diltiazem (CARTIA XT) 120 MG 24 hr capsule, Take 1 capsule (120 mg total) by mouth daily., Disp: 30 capsule, Rfl: 0 .  DULoxetine (CYMBALTA) 30 MG capsule, Take 1 capsule (30 mg total) by mouth daily., Disp: 30 capsule, Rfl: 0 .  DULoxetine (CYMBALTA) 60 MG capsule, Take 60 mg by mouth daily., Disp: , Rfl:  .  fluconazole (DIFLUCAN) 150 MG tablet, TAKE 1 TABLET NOW FOR YEAST INFECTION, MAY REPEAT IN 3 DAYS IF SYMPTOMS PERSIST, Disp: , Rfl: 0 .  Fluticasone-Salmeterol (ADVAIR DISKUS IN), Inhale into the lungs 2 (two) times daily., Disp: , Rfl:  .  folic acid (FOLVITE) 1 MG tablet, Take 1 mg by mouth daily., Disp: , Rfl:  .  furosemide (LASIX) 40 MG tablet, Take 1 tablet (40 mg total) by mouth daily. Take an additional 40 mg in the am for weight gain as needed, Disp: 30 tablet, Rfl: 0 .  losartan (COZAAR) 100 MG tablet, Take 1  tablet (100 mg total) by mouth daily. Take 1/2 tablet (50 mg) by mouth twice daily, Disp: 30 tablet, Rfl: 0 .  Magnesium Oxide, Antacid, 500 MG CAPS, once daily., Disp: , Rfl:  .  metFORMIN (GLUCOPHAGE) 500 MG tablet, Take 1 tablet (500 mg total) by mouth 2 (two) times daily with a meal., Disp: 30 tablet, Rfl: 0 .  OXYGEN, Inhale into the lungs. 2 liters at bedtime, Disp: , Rfl:  .  pantoprazole (PROTONIX) 40 MG tablet, Take 1 tablet (40 mg total) by mouth daily., Disp: 30 tablet, Rfl: 0 .  potassium chloride SA (K-DUR,KLOR-CON) 20 MEQ tablet, Take 20 mEq by mouth 2 (two) times daily., Disp: , Rfl:  .  RA MAGNESIUM 500 MG CAPS, Take 1 capsule by mouth 2 (two) times daily., Disp: , Rfl: 0 .   sucralfate (CARAFATE) 1 G tablet, Take 1 tablet (1 g total) by mouth 4 (four) times daily -  with meals and at bedtime. (Patient taking differently: Take 1 g by mouth 2 (two) times daily. ), Disp: 120 tablet, Rfl: 0 .  tiotropium (SPIRIVA HANDIHALER) 18 MCG inhalation capsule, Place 18 mcg into inhaler and inhale daily., Disp: , Rfl:  .  oxyCODONE (OXY IR/ROXICODONE) 5 MG immediate release tablet, Take 1 tablet (5 mg total) by mouth every 8 (eight) hours as needed for severe pain., Disp: 90 tablet, Rfl: 0 .  oxyCODONE (OXY IR/ROXICODONE) 5 MG immediate release tablet, Take 1 tablet (5 mg total) by mouth every 8 (eight) hours as needed for severe pain., Disp: 90 tablet, Rfl: 0 .  pregabalin (LYRICA) 75 MG capsule, Take 1 capsule (75 mg total) by mouth 3 (three) times daily., Disp: 90 capsule, Rfl: 0 .  tiZANidine (ZANAFLEX) 4 MG capsule, Take 1 capsule (4 mg total) by mouth 3 (three) times daily as needed for muscle spasms., Disp: 90 capsule, Rfl: 0  ROS  Constitutional: Denies any fever or chills Gastrointestinal: No reported hemesis, hematochezia, vomiting, or acute GI distress Musculoskeletal: Denies any acute onset joint swelling, redness, loss of ROM, or weakness Neurological: No reported episodes of acute onset apraxia, aphasia, dysarthria, agnosia, amnesia, paralysis, loss of coordination, or loss of consciousness  Allergies  Erica [name] is allergic to tramadol and penicillins.  [place]  Drug: Erica [name]  reports that she does not use drugs. Alcohol:  reports that she does not drink alcohol. Tobacco:  reports that she quit smoking about 2 years ago. Her smoking use included cigarettes. She has a 36.00 pack-year smoking history. She has never used smokeless tobacco. Medical:  has a past medical history of (HFpEF) heart failure with preserved ejection fraction ([place]), Arthritis, Asthma, Bell's palsy, Bronchitis, CHF (congestive heart failure) ([place]), COPD (chronic obstructive pulmonary disease)  ([place]), Diabetes mellitus without complication ([place]), Gastric ulcer, Hyperlipidemia, Hypertension, OSA (obstructive sleep apnea), and Shoulder injury. Surgical: Erica [name]  has a past surgical history that includes Knee surgery (Left) and Flexible bronchoscopy (N/A, 06/20/2015). Family: family history includes Breast cancer in her mother; Diabetes in her maternal grandmother; Lung cancer in her father.  Constitutional Exam  General appearance: Well nourished, well developed, and well hydrated. In no apparent acute distress Vitals:   05/16/18 1113  BP: 124/60  Pulse: 80  Resp: 16  Temp: 98.1 F (36.7 C)  SpO2: 97%  Weight: 200 lb (90.7 kg)  Height: 5' 8"  (1.727 m)  Psych/Mental status: Alert, oriented x 3 (person, place, & time)       Eyes: PERLA Respiratory: No evidence  of acute respiratory distress  Thoracic Spine Area Exam  Skin & Axial Inspection: No masses, redness, or swelling Alignment: Symmetrical Functional ROM: Unrestricted ROM Stability: No instability detected Muscle Tone/Strength: Functionally intact. No obvious neuro-muscular anomalies detected. Sensory (Neurological): Unimpaired Muscle strength & Tone: No palpable anomalies  Lumbar Spine Area Exam  Skin & Axial Inspection: No masses, redness, or swelling Alignment: Symmetrical Functional ROM: Unrestricted ROM       Stability: No instability detected Muscle Tone/Strength: Functionally intact. No obvious neuro-muscular anomalies detected. Sensory (Neurological): Unimpaired Palpation: Tender       Provocative Tests: Hyperextension/rotation test: deferred today       Lumbar quadrant test (Kemp's test): deferred today       Lateral bending test: deferred today        Gait & Posture Assessment  Ambulation: Unassisted Gait: Relatively normal for age and body habitus Posture: WNL   Lower Extremity Exam    Side: Right lower extremity  Side: Left lower extremity  Stability: No instability observed           Stability: No instability observed          Skin & Extremity Inspection: Skin color, temperature, and hair growth are WNL. No peripheral edema or cyanosis. No masses, redness, swelling, asymmetry, or associated skin lesions. No contractures.  Skin & Extremity Inspection: Skin color, temperature, and hair growth are WNL. No peripheral edema or cyanosis. No masses, redness, swelling, asymmetry, or associated skin lesions. No contractures.  Functional ROM: Unrestricted ROM                  Functional ROM: Unrestricted ROM                  Muscle Tone/Strength: Functionally intact. No obvious neuro-muscular anomalies detected.  Muscle Tone/Strength: Functionally intact. No obvious neuro-muscular anomalies detected.  Sensory (Neurological): Unimpaired  Sensory (Neurological): Unimpaired  Palpation: No palpable anomalies  Palpation: No palpable anomalies   Assessment  Primary Diagnosis & Pertinent Problem List: The primary encounter diagnosis was Lumbar spondylosis. Diagnoses of Spasm of thoracolumbar muscle (Left), Lumbar facet syndrome (Bilateral) (R>L), Peripheral neuropathy, idiopathic (upper and lower extremity), Neurogenic pain, Long term prescription opiate use, Chronic pain syndrome, Intermittent left thoracic Muscle cramps, and Post-traumatic osteoarthritis of knee (Left) were also pertinent to this visit.  Status Diagnosis  Persistent Worsening Persistent 1. Lumbar spondylosis   2. Spasm of thoracolumbar muscle (Left)   3. Lumbar facet syndrome (Bilateral) (R>L)   4. Peripheral neuropathy, idiopathic (upper and lower extremity)   5. Neurogenic pain   6. Long term prescription opiate use   7. Chronic pain syndrome   8. Intermittent left thoracic Muscle cramps   9. Post-traumatic osteoarthritis of knee (Left)     Problems updated and reviewed during this visit:` No problems updated. Plan of Care  Pharmacotherapy (Medications Ordered): Meds ordered this encounter  Medications  .  oxyCODONE (OXY IR/ROXICODONE) 5 MG immediate release tablet    Sig: Take 1 tablet (5 mg total) by mouth every 8 (eight) hours as needed for severe pain.    Dispense:  90 tablet    Refill:  0    Do not add this medication to the electronic "Automatic Refill" notification system. Patient may have prescription filled one day early if pharmacy is closed on scheduled refill date.    Order Specific Question:   Supervising Provider    Answer:   Milinda Pointer [phone]  . pregabalin (LYRICA) 75 MG capsule  Sig: Take 1 capsule (75 mg total) by mouth 3 (three) times daily.    Dispense:  90 capsule    Refill:  0    Do not add this medication to the electronic "Automatic Refill" notification system. Patient may have prescription filled one day early if pharmacy is closed on scheduled refill date.    Order Specific Question:   Supervising Provider    Answer:   Milinda Pointer [phone]  . tiZANidine (ZANAFLEX) 4 MG capsule    Sig: Take 1 capsule (4 mg total) by mouth 3 (three) times daily as needed for muscle spasms.    Dispense:  90 capsule    Refill:  0    Do not place this medication, or any other prescription from our practice, on "Automatic Refill". Patient may have prescription filled one day early if pharmacy is closed on scheduled refill date.    Order Specific Question:   Supervising Provider    Answer:   Milinda Pointer [[id number]]   New Prescriptions   No medications on file   Medications administered today: Erica [name] had no medications administered during this visit. Lab-work, procedure(s), and/or referral(s): Orders Placed This Encounter  Procedures  . ToxASSURE Select 13 (MW), Urine   Imaging and/or referral(s): None  Interventional therapies: Planned, scheduled, and/or pending:   Not at this time.   Considering:  Diagnostic bilateral lumbar facet block #3 Possible bilateral lumbar facet radiofrequency ablation  Possible bilateral diagnostic sacroiliac joint  block  Possible bilateral intra-articular hip joint injection  Possible bilateral intra-articular knee joint injection Possible bilateral genicular nerve block  Possible bilateral knee joint radiofrequency ablation.  Possible left-sided lumbar epidural steroid injection Possible right-sided cervical epidural steroid injection Possible bilateral diagnostic cervical facet block Possible bilateral diagnostic greater occipital nerve blocks Possible greater occipital nerve radiofrequency ablation.   Palliative PRN treatment(s):  Palliative bilateral lumbar facet radiofrequency ablation    Provider-requested follow-up: Return in about 4 weeks (around 06/13/2018) for MedMgmt.  Future Appointments  Date Time Provider [place]  06/13/2018  1:30 PM Vevelyn Francois, NP [facility] None   Primary Care Physician: Denton Lank, MD Location: [facility] Note by: Vevelyn Francois NP Date: 05/16/2018; Time: 3:04 PM  Pain Score Disclaimer: We use the NRS-11 scale. This is a self-reported, subjective measurement of pain severity with only modest accuracy. It is used primarily to identify changes within a particular patient. It must be understood that outpatient pain scales are significantly less accurate that those used for research, where they can be applied under ideal controlled circumstances with minimal exposure to variables. In reality, the score is likely to be a combination of pain intensity and pain affect, where pain affect describes the degree of emotional arousal or changes in action readiness caused by the sensory experience of pain. Factors such as social and work situation, setting, emotional state, anxiety levels, expectation, and prior pain experience may influence pain perception and show large inter-individual differences that may also be affected by time variables.  Patient instructions provided during this appointment: Patient Instructions   ____________________________________________________________________________________________  Medication Rules  Applies to: All patients receiving prescriptions (written or electronic).  Pharmacy of record: Pharmacy where electronic prescriptions will be sent. If written prescriptions are taken to a different pharmacy, please inform the nursing staff. The pharmacy listed in the electronic medical record should be the one where you would like electronic prescriptions to be sent.  Prescription refills: Only during scheduled appointments. Applies to both,  written and electronic prescriptions.  NOTE: The following applies primarily to controlled substances (Opioid* Pain Medications).   Patient's responsibilities: 1. Pain Pills: Bring all pain pills to every appointment (except for procedure appointments). 2. Pill Bottles: Bring pills in original pharmacy bottle. Always bring newest bottle. Bring bottle, even if empty. 3. Medication refills: You are responsible for knowing and keeping track of what medications you need refilled. The day before your appointment, write a list of all prescriptions that need to be refilled. Bring that list to your appointment and give it to the admitting nurse. Prescriptions will be written only during appointments. If you forget a medication, it will not be "Called in", "Faxed", or "electronically sent". You will need to get another appointment to get these prescribed. 4. Prescription Accuracy: You are responsible for carefully inspecting your prescriptions before leaving our office. Have the discharge nurse carefully go over each prescription with you, before taking them home. Make sure that your name is accurately spelled, that your address is correct. Check the name and dose of your medication to make sure it is accurate. Check the number of pills, and the written instructions to make sure they are clear and accurate. Make sure that you are given enough medication to  last until your next medication refill appointment. 5. Taking Medication: Take medication as prescribed. Never take more pills than instructed. Never take medication more frequently than prescribed. Taking less pills or less frequently is permitted and encouraged, when it comes to controlled substances (written prescriptions).  6. Inform other Doctors: Always inform, all of your healthcare providers, of all the medications you take. 7. Pain Medication from other Providers: You are not allowed to accept any additional pain medication from any other Doctor or Healthcare provider. There are two exceptions to this rule. (see below) In the event that you require additional pain medication, you are responsible for notifying us, as stated below. 8. Medication Agreement: You are responsible for carefully reading and following our Medication Agreement. This must be signed before receiving any prescriptions from our practice. Safely store a copy of your signed Agreement. Violations to the Agreement will result in no further prescriptions. (Additional copies of our Medication Agreement are available upon request.) 9. Laws, Rules, & Regulations: All patients are expected to follow all Federal and [employer], [employer], Rules, [employer]. Ignorance of the Laws does not constitute a valid excuse. The use of any illegal substances is prohibited. 10. Adopted CDC guidelines & recommendations: Target dosing levels will be at or below 60 MME/day. Use of benzodiazepines** is not recommended.  Exceptions: There are only two exceptions to the rule of not receiving pain medications from other Healthcare Providers. 1. Exception #1 (Emergencies): In the event of an emergency (i.e.: accident requiring emergency care), you are allowed to receive additional pain medication. However, you are responsible for: As soon as you are able, call our office [phone], at any time of the day or night, and leave a message stating your  name, the date and nature of the emergency, and the name and dose of the medication prescribed. In the event that your call is answered by a member of our staff, make sure to document and save the date, time, and the name of the person that took your information.  2. Exception #2 (Planned Surgery): In the event that you are scheduled by another doctor or dentist to have any type of surgery or procedure, you are allowed (for a period no longer than 30  days), to receive additional pain medication, for the acute post-op pain. However, in this case, you are responsible for picking up a copy of our "Post-op Pain Management for Surgeons" handout, and giving it to your surgeon or dentist. This document is available at our office, and does not require an appointment to obtain it. Simply go to our office during business hours (Monday-Thursday from 8:00 AM to 4:00 PM) (Friday 8:00 AM to 12:00 Noon) or if you have a scheduled appointment with [place], prior to your surgery, and ask for it by name. In addition, you will need to provide [place] with your name, name of your surgeon, type of surgery, and date of procedure or surgery.  *Opioid medications include: morphine, codeine, oxycodone, oxymorphone, hydrocodone, hydromorphone, meperidine, tramadol, tapentadol, buprenorphine, fentanyl, methadone. **Benzodiazepine medications include: diazepam (Valium), alprazolam (Xanax), clonazepam (Klonopine), lorazepam (Ativan), clorazepate (Tranxene), chlordiazepoxide (Librium), estazolam (Prosom), oxazepam (Serax), temazepam (Restoril), triazolam (Halcion) (Last updated: 09/15/2017) ____________________________________________________________________________________________   ____________________________________________________________________________________________  Appointment Policy Summary  It is our goal and responsibility to provide the medical community with assistance in the evaluation and management of patients with chronic  pain. Unfortunately our resources are limited. Because we do not have an unlimited amount of time, or available appointments, we are required to closely monitor and manage their use. The following rules exist to maximize their use:  Patient's responsibilities: 1. Punctuality:  At what time should I arrive? You should be physically present in our office 30 minutes before your scheduled appointment. Your scheduled appointment is with your assigned healthcare provider. However, it takes 5-10 minutes to be "checked-in", and another 15 minutes for the nurses to do the admission. If you arrive to our office at the time you were given for your appointment, you will end up being at least 20-25 minutes late to your appointment with the provider. 2. Tardiness:  What happens if I arrive only a few minutes after my scheduled appointment time? You will need to reschedule your appointment. The cutoff is your appointment time. This is why it is so important that you arrive at least 30 minutes before that appointment. If you have an appointment scheduled for 10:00 AM and you arrive at 10:01, you will be required to reschedule your appointment.  3. Plan ahead:  Always assume that you will encounter traffic on your way in. Plan for it. If you are dependent on a driver, make sure they understand these rules and the need to arrive early. 4. Other appointments and responsibilities:  Avoid scheduling any other appointments before or after your pain clinic appointments.  5. Be prepared:  Write down everything that you need to discuss with your healthcare provider and give this information to the admitting nurse. Write down the medications that you will need refilled. Bring your pills and bottles (even the empty ones), to all of your appointments, except for those where a procedure is scheduled. 6. No children or pets:  Find someone to take care of them. It is not appropriate to bring them in. 7. Scheduling changes:  We  request "advanced notification" of any changes or cancellations. 8. Advanced notification:  Defined as a time period of more than 24 hours prior to the originally scheduled appointment. This allows for the appointment to be offered to other patients. 9. Rescheduling:  When a visit is rescheduled, it will require the cancellation of the original appointment. For this reason they both fall within the category of "Cancellations".  10. Cancellations:  They require advanced notification. Any cancellation  less than 24 hours before the  appointment will be recorded as a "No Show". 11. No Show:  Defined as an unkept appointment where the patient failed to notify or declare to the practice their intention or inability to keep the appointment.  Corrective process for repeat offenders:  1. Tardiness: Three (3) episodes of rescheduling due to late arrivals will be recorded as one (1) "No Show". 2. Cancellation or reschedule: Three (3) cancellations or rescheduling will be recorded as one (1) "No Show". 3. "No Shows": Three (3) "No Shows" within a 12 month period will result in discharge from the practice. ____________________________________________________________________________________________

## 2018-05-20 LAB — TOXASSURE SELECT 13 (MW), URINE

## 2018-05-24 ENCOUNTER — Other Ambulatory Visit: Payer: Self-pay | Admitting: Cardiovascular Disease

## 2018-05-24 DIAGNOSIS — R220 Localized swelling, mass and lump, head: Secondary | ICD-10-CM | POA: Insufficient documentation

## 2018-05-30 ENCOUNTER — Telehealth: Payer: Self-pay

## 2018-05-30 MED ORDER — DILTIAZEM HCL 30 MG PO TABS: 30.0000 mg | ORAL_TABLET | Freq: Three times a day (TID) | ORAL | 0 refills | Status: DC | PRN

## 2018-05-30 NOTE — Telephone Encounter (Signed)
RX for Diltiazem 30 mg sent to local pharmacy.

## 2018-06-12 ENCOUNTER — Other Ambulatory Visit: Payer: Self-pay | Admitting: Nurse Practitioner

## 2018-06-12 DIAGNOSIS — R252 Cramp and spasm: Secondary | ICD-10-CM

## 2018-06-13 ENCOUNTER — Ambulatory Visit: Payer: Medicaid Other | Attending: Nurse Practitioner | Admitting: Nurse Practitioner

## 2018-06-13 ENCOUNTER — Other Ambulatory Visit: Payer: Self-pay

## 2018-06-13 ENCOUNTER — Encounter: Payer: Self-pay | Admitting: Nurse Practitioner

## 2018-06-13 VITALS — BP 159/88 | HR 87 | Temp 98.5°F | Resp 18 | Ht 68.0 in | Wt 200.0 lb

## 2018-06-13 DIAGNOSIS — R252 Cramp and spasm: Secondary | ICD-10-CM

## 2018-06-13 DIAGNOSIS — G4733 Obstructive sleep apnea (adult) (pediatric): Secondary | ICD-10-CM | POA: Insufficient documentation

## 2018-06-13 DIAGNOSIS — G609 Hereditary and idiopathic neuropathy, unspecified: Secondary | ICD-10-CM | POA: Diagnosis not present

## 2018-06-13 DIAGNOSIS — Z888 Allergy status to other drugs, medicaments and biological substances status: Secondary | ICD-10-CM | POA: Insufficient documentation

## 2018-06-13 DIAGNOSIS — I110 Hypertensive heart disease with heart failure: Secondary | ICD-10-CM | POA: Diagnosis not present

## 2018-06-13 DIAGNOSIS — M47817 Spondylosis without myelopathy or radiculopathy, lumbosacral region: Secondary | ICD-10-CM | POA: Diagnosis not present

## 2018-06-13 DIAGNOSIS — J440 Chronic obstructive pulmonary disease with acute lower respiratory infection: Secondary | ICD-10-CM | POA: Insufficient documentation

## 2018-06-13 DIAGNOSIS — G894 Chronic pain syndrome: Secondary | ICD-10-CM

## 2018-06-13 DIAGNOSIS — Z79891 Long term (current) use of opiate analgesic: Secondary | ICD-10-CM | POA: Diagnosis not present

## 2018-06-13 DIAGNOSIS — M160 Bilateral primary osteoarthritis of hip: Secondary | ICD-10-CM

## 2018-06-13 DIAGNOSIS — Z7982 Long term (current) use of aspirin: Secondary | ICD-10-CM | POA: Insufficient documentation

## 2018-06-13 DIAGNOSIS — R7982 Elevated C-reactive protein (CRP): Secondary | ICD-10-CM | POA: Diagnosis not present

## 2018-06-13 DIAGNOSIS — N289 Disorder of kidney and ureter, unspecified: Secondary | ICD-10-CM | POA: Insufficient documentation

## 2018-06-13 DIAGNOSIS — Z880 Allergy status to penicillin: Secondary | ICD-10-CM | POA: Insufficient documentation

## 2018-06-13 DIAGNOSIS — M533 Sacrococcygeal disorders, not elsewhere classified: Secondary | ICD-10-CM | POA: Diagnosis not present

## 2018-06-13 DIAGNOSIS — E785 Hyperlipidemia, unspecified: Secondary | ICD-10-CM | POA: Insufficient documentation

## 2018-06-13 DIAGNOSIS — J449 Chronic obstructive pulmonary disease, unspecified: Secondary | ICD-10-CM | POA: Diagnosis not present

## 2018-06-13 DIAGNOSIS — E1142 Type 2 diabetes mellitus with diabetic polyneuropathy: Secondary | ICD-10-CM | POA: Diagnosis not present

## 2018-06-13 DIAGNOSIS — Z8719 Personal history of other diseases of the digestive system: Secondary | ICD-10-CM | POA: Insufficient documentation

## 2018-06-13 DIAGNOSIS — M542 Cervicalgia: Secondary | ICD-10-CM | POA: Insufficient documentation

## 2018-06-13 DIAGNOSIS — Z5181 Encounter for therapeutic drug level monitoring: Secondary | ICD-10-CM | POA: Diagnosis present

## 2018-06-13 DIAGNOSIS — Z87891 Personal history of nicotine dependence: Secondary | ICD-10-CM | POA: Diagnosis not present

## 2018-06-13 DIAGNOSIS — Z7984 Long term (current) use of oral hypoglycemic drugs: Secondary | ICD-10-CM | POA: Insufficient documentation

## 2018-06-13 DIAGNOSIS — R000 Tachycardia, unspecified: Secondary | ICD-10-CM | POA: Insufficient documentation

## 2018-06-13 DIAGNOSIS — I272 Pulmonary hypertension, unspecified: Secondary | ICD-10-CM | POA: Diagnosis not present

## 2018-06-13 DIAGNOSIS — M170 Bilateral primary osteoarthritis of knee: Secondary | ICD-10-CM | POA: Insufficient documentation

## 2018-06-13 DIAGNOSIS — F329 Major depressive disorder, single episode, unspecified: Secondary | ICD-10-CM | POA: Diagnosis not present

## 2018-06-13 DIAGNOSIS — I5032 Chronic diastolic (congestive) heart failure: Secondary | ICD-10-CM | POA: Diagnosis not present

## 2018-06-13 DIAGNOSIS — Z885 Allergy status to narcotic agent status: Secondary | ICD-10-CM | POA: Insufficient documentation

## 2018-06-13 DIAGNOSIS — Z79899 Other long term (current) drug therapy: Secondary | ICD-10-CM | POA: Insufficient documentation

## 2018-06-13 DIAGNOSIS — R700 Elevated erythrocyte sedimentation rate: Secondary | ICD-10-CM | POA: Insufficient documentation

## 2018-06-13 DIAGNOSIS — M792 Neuralgia and neuritis, unspecified: Secondary | ICD-10-CM

## 2018-06-13 MED ORDER — TIZANIDINE HCL 4 MG PO CAPS: 4.0000 mg | ORAL_CAPSULE | Freq: Three times a day (TID) | ORAL | 1 refills | Status: DC | PRN

## 2018-06-13 MED ORDER — OXYCODONE HCL 5 MG PO TABS: 5.0000 mg | ORAL_TABLET | Freq: Three times a day (TID) | ORAL | 0 refills | Status: DC | PRN

## 2018-06-13 MED ORDER — PREGABALIN 75 MG PO CAPS: 75.0000 mg | ORAL_CAPSULE | Freq: Three times a day (TID) | ORAL | 1 refills | Status: DC

## 2018-06-13 NOTE — Progress Notes (Signed)
Nursing Pain Medication Assessment:  Safety precautions to be maintained throughout the outpatient stay will include: orient to surroundings, keep bed in low position, maintain call bell within reach at all times, provide assistance with transfer out of bed and ambulation.  Medication Inspection Compliance: Pill count conducted under aseptic conditions, in front of the patient. Neither the pills nor the bottle was removed from the patient's sight at any time. Once count was completed pills were immediately returned to the patient in their original bottle.  Medication #1: Oxycodone IR Pill/Patch Count: 7 of 90 pills remain Pill/Patch Appearance: Markings consistent with prescribed medication Bottle Appearance: Standard pharmacy container. Clearly labeled. Filled Date: 10 / 29 / 2019 Last Medication intake:  Today

## 2018-06-13 NOTE — Progress Notes (Signed)
Patient's Name: Erica [name]  MRN: [id number]  Referring Provider: Denton Lank, MD  DOB: [birth year]  PCP: Denton Lank, MD  DOS: 06/13/2018  Note by: Vevelyn Francois NP  Service setting: Ambulatory outpatient  Specialty: Interventional Pain Management  Location: [facility] (AMB) Pain Management Facility    Patient type: Established    Primary Reason(s) for Visit: Encounter for prescription drug management. (Level of risk: moderate)  CC: Medication Refill  HPI  Erica [name] is a 52 y.o. year old, female patient, who comes today for a medication management evaluation. She has Chest pain; Hyponatremia; Shortness of breath; Swelling; Cough; COPD (chronic obstructive pulmonary disease) with acute bronchitis ([place]); Renal insufficiency; Pulmonary HTN ([place]); COPD, mild ([place]); Chronic diastolic heart failure ([place]); Smoker; Obstructive sleep apnea; Tachycardia; Depression; Long term current use of opiate analgesic; Long term prescription opiate use; Opiate use; Chronic low back pain (Primary Source of Pain) (Bilateral) (L>R); Chronic knee pain [facility] source of pain) (Bilateral) (L>R); Chronic neck pain (Bilateral) (R>L); Chronic upper back pain (midline); Chronic foot pain (bottom of feet) (Bilateral) (R>L); Chronic hand pain (Bilateral); Peripheral neuropathy, idiopathic (upper and lower extremity); Chronic sacroiliac joint pain (Bilateral) (R>L); Lumbar facet syndrome (Bilateral) (R>L); Chronic pain syndrome; Elevated sedimentation rate; Elevated C-reactive protein (CRP); Neurogenic pain; Vitamin D deficiency; Lumbar spondylosis; Osteoarthritis of knee (Bilateral) (L>R); Intermittent left thoracic Muscle cramps; Spasm of thoracolumbar muscle (Left); Post-traumatic osteoarthritis of knee (Left); Hemarthrosis, left knee; 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); Chronic hip pain (Secondary source of pain) (Bilateral) (L>R); Diabetes ([place]); HTN (hypertension); Vertigo; Spondylosis without myelopathy or radiculopathy,  lumbosacral region; Arthropathy of left hip; Rib pain on right side; Obesity (BMI 30.0-34.9); and Primary localized osteoarthrosis, pelvic region and thigh on their problem list. Her primarily concern today is the Medication Refill  Pain Assessment: Location: Lower Back Radiating: At its worse pain can radiate to left hip and left leg making left leg weak  Onset: More than a month ago Duration: Chronic pain Quality: Constant, Aching, Burning Severity: 3 /10 (subjective, self-reported pain score)  Note: Reported level is compatible with observation.                          Effect on ADL: "Difficult to bend, unable to pick up anything heavy or dusting is painful"  Timing: Constant Modifying factors: Medication, streching  BP: (!) 159/88  HR: 87  Erica [name] was last scheduled for an appointment on 06/12/2018 for medication management. During today's appointment we reviewed Erica [name]'s chronic pain status, as well as her outpatient medication regimen. She admits that she has numbness and weakness in her left foot. She admits that with walking this resolves. She is having left knee pain. She will be going to [facility] for left knee replacement in the near future.   The patient  reports that she does not use drugs. Her body mass index is 30.41 kg/m.  Further details on both, my assessment(s), as well as the proposed treatment plan, please see below.  Controlled Substance Pharmacotherapy Assessment REMS (Risk Evaluation and Mitigation Strategy)  Analgesic:Oxycodone IR 5 mg 1 tablet by mouth every 8 hours (71m/dayof oxycodone) (22.5MME/day) MME/day:53mday.  RiJanne NapoleonRN  06/13/2018  1:49 PM  Sign at close encounter Nursing Pain Medication Assessment:  Safety precautions to be maintained throughout the outpatient stay will include: orient to surroundings, keep bed in low position, maintain call bell within reach at all times, provide assistance with transfer out of bed  and  ambulation.  Medication Inspection Compliance: Pill count conducted under aseptic conditions, in front of the patient. Neither the pills nor the bottle was removed from the patient's sight at any time. Once count was completed pills were immediately returned to the patient in their original bottle.  Medication #1: Oxycodone IR Pill/Patch Count: 7 of 90 pills remain Pill/Patch Appearance: Markings consistent with prescribed medication Bottle Appearance: Standard pharmacy container. Clearly labeled. Filled Date: 10 / 29 / 2019 Last Medication intake:  Today     Pharmacokinetics: Liberation and absorption (onset of action): WNL Distribution (time to peak effect): WNL Metabolism and excretion (duration of action): WNL         Pharmacodynamics: Desired effects: Analgesia: Erica [name] reports >50% benefit. Functional ability: Patient reports that medication allows her to accomplish basic ADLs Clinically meaningful improvement in function (CMIF): Sustained CMIF goals met Perceived effectiveness: Described as relatively effective, allowing for increase in activities of daily living (ADL) Undesirable effects: Side-effects or Adverse reactions: None reported Monitoring: [place] PMP: Online review of the past 61-monthperiod conducted. Compliant with practice rules and regulations Last UDS on record: Summary  Date Value Ref Range Status  05/16/2018 FINAL  Final    Comment:    ==================================================================== TOXASSURE SELECT 13 (MW) ==================================================================== Test                             Result       Flag       Units Drug Present not Declared for Prescription Verification   Oxazepam                       20           UNEXPECTED ng/mg creat    Oxazepam may be administered as a scheduled prescription    medication; it is also an expected metabolite of other    benzodiazepine drugs, including diazepam,  chlordiazepoxide,    prazepam, clorazepate, halazepam, and temazepam. Drug Absent but Declared for Prescription Verification   Oxycodone                      Not Detected UNEXPECTED ng/mg creat ==================================================================== Test                      Result    Flag   Units      Ref Range   Creatinine              152              mg/dL      >=20 ==================================================================== Declared Medications:  The flagging and interpretation on this report are based on the  following declared medications.  Unexpected results may arise from  inaccuracies in the declared medications.  **Note: The testing scope of this panel includes these medications:  Oxycodone  **Note: The testing scope of this panel does not include following  reported medications:  Albuterol  Aspirin (Aspirin 81)  Atorvastatin  Cholecalciferol  Diltiazem  Duloxetine  Fluconazole  Fluticasone  Folic acid  Furosemide  Losartan (Losartan Potassium)  Magnesium Oxide  Metformin  Oxygen  Pantoprazole  Potassium  Pregabalin  Sucralfate  Tiotropium  Tizanidine ==================================================================== For clinical consultation, please call ([phone] ====================================================================    UDS interpretation: Unexpected findings:          Medication Assessment Form: Reviewed. Patient indicates being compliant with therapy  Treatment compliance: Non-compliant Risk Assessment Profile: Aberrant behavior: frequent emergency department visits for pain and frequent involvement in accidents Comorbid factors increasing risk of overdose: age 31-52 years old, Benzodiazepine use, caucasian, COPD or asthma and nicotine dependence Opioid risk tool (ORT) (Total Score): 1 Personal History of Substance Abuse (SUD-Substance use disorder):  Alcohol: Negative  Illegal Drugs: Negative  Rx Drugs:  Negative  ORT Risk Level calculation: Low Risk Risk of substance use disorder (SUD): Moderate-to-High Opioid Risk Tool - 06/13/18 1346      Family History of Substance Abuse   Alcohol  Negative    Illegal Drugs  Negative    Rx Drugs  Negative      Personal History of Substance Abuse   Alcohol  Negative    Illegal Drugs  Negative    Rx Drugs  Negative      Age   Age between 16-45 years   No      Psychological Disease   Psychological Disease  Negative    Depression  Positive      Total Score   Opioid Risk Tool Scoring  1    Opioid Risk Interpretation  Low Risk      ORT Scoring interpretation table:  Score <3 = Low Risk for SUD  Score between 4-7 = Moderate Risk for SUD  Score >8 = High Risk for Opioid Abuse   Risk Mitigation Strategies:  Patient Counseling: Covered Patient-Prescriber Agreement (PPA): Present and active  Notification to other healthcare providers: Done  Pharmacologic Plan: No change in therapy, at this time.             Laboratory Chemistry  Inflammation Markers (CRP: Acute Phase) (ESR: Chronic Phase) Lab Results  Component Value Date   CRP 1.3 (H) 04/30/2016   ESRSEDRATE 31 (H) 04/30/2016                         Rheumatology Markers Lab Results  Component Value Date   RF <10.0 06/29/2016   ANA Negative 06/29/2016   LABURIC 7.1 (H) 06/19/2015                        Renal Function Markers Lab Results  Component Value Date   BUN 9 05/04/2018   CREATININE 0.74 05/04/2018   BCR 15 04/22/2016   GFRAA >60 05/04/2018   GFRNONAA >60 05/04/2018                             Hepatic Function Markers Lab Results  Component Value Date   AST 24 02/06/2018   ALT 15 02/06/2018   ALBUMIN 3.7 02/06/2018   ALKPHOS 72 02/06/2018   LIPASE 90 02/10/2013                        Electrolytes Lab Results  Component Value Date   NA 138 05/04/2018   K 3.4 (L) 05/04/2018   CL 105 05/04/2018   CALCIUM 9.4 05/04/2018   MG 1.9 04/30/2016                         Neuropathy Markers Lab Results  Component Value Date   VITAMINB12 248 04/30/2016   HGBA1C 6.2 (H) 06/14/2015   HIV NON REACTIVE 06/14/2015  CNS Tests No results found for: COLORCSF, APPEARCSF, RBCCOUNTCSF, WBCCSF, POLYSCSF, LYMPHSCSF, EOSCSF, PROTEINCSF, GLUCCSF, JCVIRUS, CSFOLI, IGGCSF                      Bone Pathology Markers Lab Results  Component Value Date   25OHVITD1 15 (L) 04/30/2016   25OHVITD2 <1.0 04/30/2016   25OHVITD3 15 04/30/2016                         Coagulation Parameters Lab Results  Component Value Date   INR 0.96 02/06/2018   LABPROT 12.7 02/06/2018   APTT 35 02/06/2018   PLT 340 05/04/2018                        Cardiovascular Markers Lab Results  Component Value Date   BNP 42.0 06/14/2016   CKTOTAL 56 03/10/2013   CKMB 0.7 03/10/2013   TROPONINI <0.03 05/04/2018   HGB 12.8 05/04/2018   HCT 39.6 05/04/2018                         CA Markers No results found for: CEA, CA125, LABCA2                      Note: Lab results reviewed.  Recent Diagnostic Imaging Results  DG Chest 2 View CLINICAL DATA:  Generalized chest pain for several days, worse tonight. Shortness of breath, cough, dizziness, and back pain.  EXAM: CHEST - 2 VIEW  COMPARISON:  04/08/2017  FINDINGS: The heart size and mediastinal contours are within normal limits. Both lungs are clear. The visualized skeletal structures are unremarkable.  IMPRESSION: No active cardiopulmonary disease.  Electronically Signed   By: Lucienne Capers M.D.   On: 05/04/2018 03:55  Complexity Note: Imaging results reviewed. Results shared with Erica [name], using [employer].                         Meds   Current Outpatient Medications:  .  albuterol (PROAIR HFA) 108 (90 Base) MCG/ACT inhaler, Inhale into the lungs 4 (four) times daily., Disp: , Rfl:  .  aspirin EC 81 MG EC tablet, Take 1 tablet (81 mg total) by mouth daily., Disp: 30 tablet, Rfl:  0 .  atorvastatin (LIPITOR) 80 MG tablet, Take 1 tablet (80 mg total) by mouth daily., Disp: 30 tablet, Rfl: 0 .  Blood Glucose Monitoring Suppl (ACCU-CHEK AVIVA PLUS) w/Device KIT, See admin instructions., Disp: , Rfl: 0 .  Cholecalciferol (VITAMIN D3) 2000 units capsule, Take 1 capsule (2,000 Units total) by mouth daily., Disp: 30 capsule, Rfl: PRN .  diltiazem (CARDIZEM) 30 MG tablet, Take 1 tablet (30 mg total) by mouth 3 (three) times daily as needed., Disp: 90 tablet, Rfl: 0 .  diltiazem (CARTIA XT) 120 MG 24 hr capsule, Take 1 capsule (120 mg total) by mouth daily., Disp: 30 capsule, Rfl: 0 .  DULoxetine (CYMBALTA) 30 MG capsule, Take 1 capsule (30 mg total) by mouth daily., Disp: 30 capsule, Rfl: 0 .  DULoxetine (CYMBALTA) 60 MG capsule, Take 60 mg by mouth daily., Disp: , Rfl:  .  fluconazole (DIFLUCAN) 150 MG tablet, TAKE 1 TABLET NOW FOR YEAST INFECTION, MAY REPEAT IN 3 DAYS IF SYMPTOMS PERSIST, Disp: , Rfl: 0 .  Fluticasone-Salmeterol (ADVAIR DISKUS IN), Inhale into the lungs 2 (two) times daily., Disp: , Rfl:  .  folic acid (FOLVITE) 1 MG tablet, Take 1 mg by mouth daily., Disp: , Rfl:  .  furosemide (LASIX) 40 MG tablet, Take 1 tablet (40 mg total) by mouth daily. Take an additional 40 mg in the am for weight gain as needed, Disp: 30 tablet, Rfl: 0 .  losartan (COZAAR) 100 MG tablet, Take 1 tablet (100 mg total) by mouth daily. Take 1/2 tablet (50 mg) by mouth twice daily, Disp: 30 tablet, Rfl: 0 .  Magnesium Oxide, Antacid, 500 MG CAPS, once daily., Disp: , Rfl:  .  metFORMIN (GLUCOPHAGE) 500 MG tablet, Take 1 tablet (500 mg total) by mouth 2 (two) times daily with a meal., Disp: 30 tablet, Rfl: 0 .  [START ON 06/15/2018] oxyCODONE (OXY IR/ROXICODONE) 5 MG immediate release tablet, Take 1 tablet (5 mg total) by mouth every 8 (eight) hours as needed for severe pain., Disp: 90 tablet, Rfl: 0 .  [START ON 07/15/2018] oxyCODONE (OXY IR/ROXICODONE) 5 MG immediate release tablet, Take 1  tablet (5 mg total) by mouth every 8 (eight) hours as needed for severe pain., Disp: 90 tablet, Rfl: 0 .  OXYGEN, Inhale into the lungs. 2 liters at bedtime, Disp: , Rfl:  .  pantoprazole (PROTONIX) 40 MG tablet, Take 1 tablet (40 mg total) by mouth daily., Disp: 30 tablet, Rfl: 0 .  potassium chloride SA (K-DUR,KLOR-CON) 20 MEQ tablet, Take 20 mEq by mouth 2 (two) times daily., Disp: , Rfl:  .  [START ON 06/15/2018] pregabalin (LYRICA) 75 MG capsule, Take 1 capsule (75 mg total) by mouth 3 (three) times daily., Disp: 90 capsule, Rfl: 1 .  RA MAGNESIUM 500 MG CAPS, Take 1 capsule by mouth 2 (two) times daily., Disp: , Rfl: 0 .  sucralfate (CARAFATE) 1 G tablet, Take 1 tablet (1 g total) by mouth 4 (four) times daily -  with meals and at bedtime. (Patient taking differently: Take 1 g by mouth 2 (two) times daily. ), Disp: 120 tablet, Rfl: 0 .  tiotropium (SPIRIVA HANDIHALER) 18 MCG inhalation capsule, Place 18 mcg into inhaler and inhale daily., Disp: , Rfl:  .  [START ON 06/15/2018] tiZANidine (ZANAFLEX) 4 MG capsule, Take 1 capsule (4 mg total) by mouth 3 (three) times daily as needed for muscle spasms., Disp: 90 capsule, Rfl: 1  ROS  Constitutional: Denies any fever or chills Gastrointestinal: No reported hemesis, hematochezia, vomiting, or acute GI distress Musculoskeletal: Denies any acute onset joint swelling, redness, loss of ROM, or weakness Neurological: No reported episodes of acute onset apraxia, aphasia, dysarthria, agnosia, amnesia, paralysis, loss of coordination, or loss of consciousness  Allergies  Erica [name] is allergic to tramadol and penicillins.  [place]  Drug: Erica [name]  reports that she does not use drugs. Alcohol:  reports that she does not drink alcohol. Tobacco:  reports that she quit smoking about 3 years ago. Her smoking use included cigarettes. She has a 36.00 pack-year smoking history. She has never used smokeless tobacco. Medical:  has a past medical history of  (HFpEF) heart failure with preserved ejection fraction ([place]), Arthritis, Asthma, Bell's palsy, Bronchitis, CHF (congestive heart failure) ([place]), COPD (chronic obstructive pulmonary disease) ([place]), Diabetes mellitus without complication ([place]), Gastric ulcer, Hyperlipidemia, Hypertension, OSA (obstructive sleep apnea), and Shoulder injury. Surgical: Erica [name]  has a past surgical history that includes Knee surgery (Left) and Flexible bronchoscopy (N/A, 06/20/2015). Family: family history includes Breast cancer in her mother; Diabetes in her maternal grandmother; Lung cancer in her father.  Constitutional Exam  General appearance: Well nourished, well developed, and well hydrated. In no apparent acute distress Vitals:   06/13/18 1338  BP: (!) 159/88  Pulse: 87  Resp: 18  Temp: 98.5 F (36.9 C)  SpO2: 98%  Weight: 200 lb (90.7 kg)  Height: _0  (1.727 m)  Psych/Mental status: Alert, oriented x 3 (person, place, & time)       Eyes: PERLA Respiratory: No evidence of acute respiratory distress  Lumbar Spine Area Exam  Skin & Axial Inspection: No masses, redness, or swelling Alignment: Symmetrical Functional ROM: Unrestricted ROM       Stability: No instability detected Muscle Tone/Strength: Functionally intact. No obvious neuro-muscular anomalies detected. Sensory (Neurological): Unimpaired Palpation: No palpable anomalies        Gait & Posture Assessment  Ambulation: Unassisted Gait: Relatively normal for age and body habitus Posture: WNL   Lower Extremity Exam    Side: Right lower extremity  Side: Left lower extremity  Stability: No instability observed          Stability: No instability observed          Skin & Extremity Inspection: Skin color, temperature, and hair growth are WNL. No peripheral edema or cyanosis. No masses, redness, swelling, asymmetry, or associated skin lesions. No contractures.  Skin & Extremity Inspection: Evidence of prior arthroplastic surgery  Functional  ROM: Unrestricted ROM                  Functional ROM: Unrestricted ROM                  Muscle Tone/Strength: Functionally intact. No obvious neuro-muscular anomalies detected.  Muscle Tone/Strength: Functionally intact. No obvious neuro-muscular anomalies detected.  Sensory (Neurological): Unimpaired        Sensory (Neurological): Unimpaired            Palpation: No palpable anomalies  Palpation: No palpable anomalies   Assessment  Primary Diagnosis & Pertinent Problem List: The primary encounter diagnosis was Spondylosis without myelopathy or radiculopathy, lumbosacral region. Diagnoses of 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), Peripheral neuropathy, idiopathic (upper and lower extremity), Chronic pain syndrome, Neurogenic pain, and Intermittent left thoracic Muscle cramps were also pertinent to this visit.  Status Diagnosis  Controlled Controlled Controlled 1. Spondylosis without myelopathy or radiculopathy, lumbosacral region   2. 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 Peripheral neuropathy, idiopathic (upper and lower extremity)   4. Chronic pain syndrome   5. Neurogenic pain   6. Intermittent left thoracic Muscle cramps     Problems updated and reviewed during this visit: No problems updated. Plan of Care  Pharmacotherapy (Medications Ordered): Meds ordered this encounter  Medications  . oxyCODONE (OXY IR/ROXICODONE) 5 MG immediate release tablet    Sig: Take 1 tablet (5 mg total) by mouth every 8 (eight) hours as needed for severe pain.    Dispense:  90 tablet    Refill:  0    Do not place this medication, or any other prescription from our practice, on "Automatic Refill". Patient may have prescription filled one day early if pharmacy is closed on scheduled refill date.    Order Specific Question:   Supervising Provider    Answer:   Milinda Pointer [phone]  . pregabalin (LYRICA) 75 MG capsule    Sig: Take 1 capsule (75 mg total) by mouth 3 (three) times daily.     Dispense:  90 capsule    Refill:  1    Do not add this medication  to the electronic "Automatic Refill" notification system. Patient may have prescription filled one day early if pharmacy is closed on scheduled refill date.    Order Specific Question:   Supervising Provider    Answer:   Milinda Pointer [phone]  . tiZANidine (ZANAFLEX) 4 MG capsule    Sig: Take 1 capsule (4 mg total) by mouth 3 (three) times daily as needed for muscle spasms.    Dispense:  90 capsule    Refill:  1    Do not place this medication, or any other prescription from our practice, on "Automatic Refill". Patient may have prescription filled one day early if pharmacy is closed on scheduled refill date.    Order Specific Question:   Supervising Provider    Answer:   Milinda Pointer [phone]  . oxyCODONE (OXY IR/ROXICODONE) 5 MG immediate release tablet    Sig: Take 1 tablet (5 mg total) by mouth every 8 (eight) hours as needed for severe pain.    Dispense:  90 tablet    Refill:  0    Do not place this medication, or any other prescription from our practice, on "Automatic Refill". Patient may have prescription filled one day early if pharmacy is closed on scheduled refill date.    Order Specific Question:   Supervising Provider    Answer:   Milinda Pointer [[id number]]   New Prescriptions   No medications on file   Medications administered today: Erica [name] had no medications administered during this visit. Lab-work, procedure(s), and/or referral(s): No orders of the defined types were placed in this encounter.  Imaging and/or referral(s): None  Interventional therapies: Planned, scheduled, and/or pending: Not at this time.   Considering:  Diagnostic bilateral lumbar facet block #3 Possible bilateral lumbar facet radiofrequency ablation  Possible bilateral diagnostic sacroiliac joint block  Possible bilateral intra-articular hip joint injection  Possible bilateral intra-articular knee  joint injection Possible bilateral genicular nerve block  Possible bilateral knee joint radiofrequency ablation.  Possible left-sided lumbar epidural steroid injection Possible right-sided cervical epidural steroid injection Possible bilateral diagnostic cervical facet block Possible bilateral diagnostic greater occipital nerve blocks Possible greater occipital nerve radiofrequency ablation.   Palliative PRN treatment(s):  Palliative bilateral lumbar facet radiofrequency ablation    Provider-requested follow-up: Return in about 2 months (around 08/13/2018) for MedMgmt.  Future Appointments  Date Time Provider [place]  08/09/2018 12:45 PM Vevelyn Francois, NP [facility] None   Primary Care Physician: Denton Lank, MD Location: [facility] Note by: Vevelyn Francois NP Date: 06/13/2018; Time: 2:21 PM  Pain Score Disclaimer: We use the NRS-11 scale. This is a self-reported, subjective measurement of pain severity with only modest accuracy. It is used primarily to identify changes within a particular patient. It must be understood that outpatient pain scales are significantly less accurate that those used for research, where they can be applied under ideal controlled circumstances with minimal exposure to variables. In reality, the score is likely to be a combination of pain intensity and pain affect, where pain affect describes the degree of emotional arousal or changes in action readiness caused by the sensory experience of pain. Factors such as social and work situation, setting, emotional state, anxiety levels, expectation, and prior pain experience may influence pain perception and show large inter-individual differences that may also be affected by time variables.  Patient instructions provided during this appointment: Patient Instructions   ____________________________________________________________________________________________  Medication  Rules  Purpose: To inform patients, and their family  members, of our rules and regulations.  Applies to: All patients receiving prescriptions (written or electronic).  Pharmacy of record: Pharmacy where electronic prescriptions will be sent. If written prescriptions are taken to a different pharmacy, please inform the nursing staff. The pharmacy listed in the electronic medical record should be the one where you would like electronic prescriptions to be sent.  Electronic prescriptions: In compliance with the [place] (STOP) Act of 2017 (Session Lanny Cramp [phone]), effective July 19, 2018, all controlled substances must be electronically prescribed. Calling prescriptions to the pharmacy will cease to exist.  Prescription refills: Only during scheduled appointments. Applies to all prescriptions.  NOTE: The following applies primarily to controlled substances (Opioid* Pain Medications).   Patient's responsibilities: 1. Pain Pills: Bring all pain pills to every appointment (except for procedure appointments). 2. Pill Bottles: Bring pills in original pharmacy bottle. Always bring the newest bottle. Bring bottle, even if empty. 3. Medication refills: You are responsible for knowing and keeping track of what medications you take and those you need refilled. The day before your appointment: write a list of all prescriptions that need to be refilled. The day of the appointment: give the list to the admitting nurse. Prescriptions will be written only during appointments. If you forget a medication: it will not be "Called in", "Faxed", or "electronically sent". You will need to get another appointment to get these prescribed. No early refills. Do not call asking to have your prescription filled early. 4. Prescription Accuracy: You are responsible for carefully inspecting your prescriptions before leaving our office. Have the discharge nurse carefully go over each  prescription with you, before taking them home. Make sure that your name is accurately spelled, that your address is correct. Check the name and dose of your medication to make sure it is accurate. Check the number of pills, and the written instructions to make sure they are clear and accurate. Make sure that you are given enough medication to last until your next medication refill appointment. 5. Taking Medication: Take medication as prescribed. When it comes to controlled substances, taking less pills or less frequently than prescribed is permitted and encouraged. Never take more pills than instructed. Never take medication more frequently than prescribed.  6. Inform other Doctors: Always inform, all of your healthcare providers, of all the medications you take. 7. Pain Medication from other Providers: You are not allowed to accept any additional pain medication from any other Doctor or Healthcare provider. There are two exceptions to this rule. (see below) In the event that you require additional pain medication, you are responsible for notifying us, as stated below. 8. Medication Agreement: You are responsible for carefully reading and following our Medication Agreement. This must be signed before receiving any prescriptions from our practice. Safely store a copy of your signed Agreement. Violations to the Agreement will result in no further prescriptions. (Additional copies of our Medication Agreement are available upon request.) 9. Laws, Rules, & Regulations: All patients are expected to follow all Federal and [employer], Rules, [employer]. Ignorance of the Laws does not constitute a valid excuse. The use of any illegal substances is prohibited. 10. Adopted CDC guidelines & recommendations: Target dosing levels will be at or below 60 MME/day. Use of benzodiazepines** is not recommended.  Exceptions: There are only two exceptions to the rule of not receiving pain medications from other  Healthcare Providers. 1. Exception #1 (Emergencies): In the event of an emergency (i.e.: accident requiring  emergency care), you are allowed to receive additional pain medication. However, you are responsible for: As soon as you are able, call our office [phone], at any time of the day or night, and leave a message stating your name, the date and nature of the emergency, and the name and dose of the medication prescribed. In the event that your call is answered by a member of our staff, make sure to document and save the date, time, and the name of the person that took your information.  2. Exception #2 (Planned Surgery): In the event that you are scheduled by another doctor or dentist to have any type of surgery or procedure, you are allowed (for a period no longer than 30 days), to receive additional pain medication, for the acute post-op pain. However, in this case, you are responsible for picking up a copy of our "Post-op Pain Management for Surgeons" handout, and giving it to your surgeon or dentist. This document is available at our office, and does not require an appointment to obtain it. Simply go to our office during business hours (Monday-Thursday from 8:00 AM to 4:00 PM) (Friday 8:00 AM to 12:00 Noon) or if you have a scheduled appointment with [place], prior to your surgery, and ask for it by name. In addition, you will need to provide [place] with your name, name of your surgeon, type of surgery, and date of procedure or surgery.  *Opioid medications include: morphine, codeine, oxycodone, oxymorphone, hydrocodone, hydromorphone, meperidine, tramadol, tapentadol, buprenorphine, fentanyl, methadone. **Benzodiazepine medications include: diazepam (Valium), alprazolam (Xanax), clonazepam (Klonopine), lorazepam (Ativan), clorazepate (Tranxene), chlordiazepoxide (Librium), estazolam (Prosom), oxazepam (Serax), temazepam (Restoril), triazolam (Halcion) (Last updated:  09/15/2017) ____________________________________________________________________________________________   BMI Assessment: Estimated body mass index is 30.41 kg/m as calculated from the following:   Height as of this encounter: _0  (1.727 m).   Weight as of this encounter: 200 lb (90.7 kg).  BMI interpretation table: BMI level Category Range association with higher incidence of chronic pain  <18 kg/m2 Underweight   18.5-24.9 kg/m2 Ideal body weight   25-29.9 kg/m2 Overweight Increased incidence by 20%  30-34.9 kg/m2 Obese (Class I) Increased incidence by 68%  35-39.9 kg/m2 Severe obesity (Class II) Increased incidence by 136%  >40 kg/m2 Extreme obesity (Class III) Increased incidence by 254%   Patient's current BMI Ideal Body weight  Body mass index is 30.41 kg/m. Ideal body weight: 63.9 kg (140 lb 14 oz) Adjusted ideal body weight: 74.6 kg (164 lb 8.4 oz)   BMI Readings from Last 4 Encounters:  06/13/18 30.41 kg/m  05/16/18 30.41 kg/m  02/27/18 30.41 kg/m  02/06/18 30.41 kg/m   Wt Readings from Last 4 Encounters:  06/13/18 200 lb (90.7 kg)  05/16/18 200 lb (90.7 kg)  02/27/18 200 lb (90.7 kg)  02/06/18 200 lb (90.7 kg)

## 2018-06-13 NOTE — Patient Instructions (Addendum)
____________________________________________________________________________________________  Medication Rules  Purpose: To inform patients, and their family members, of our rules and regulations.  Applies to: All patients receiving prescriptions (written or electronic).  Pharmacy of record: Pharmacy where electronic prescriptions will be sent. If written prescriptions are taken to a different pharmacy, please inform the nursing staff. The pharmacy listed in the electronic medical record should be the one where you would like electronic prescriptions to be sent.  Electronic prescriptions: In compliance with the [place] Strengthen Opioid Misuse Prevention (STOP) Act of 2017 (Session Law 2017-74/H243), effective July 19, 2018, all controlled substances must be electronically prescribed. Calling prescriptions to the pharmacy will cease to exist.  Prescription refills: Only during scheduled appointments. Applies to all prescriptions.  NOTE: The following applies primarily to controlled substances (Opioid* Pain Medications).   Patient's responsibilities: 1. Pain Pills: Bring all pain pills to every appointment (except for procedure appointments). 2. Pill Bottles: Bring pills in original pharmacy bottle. Always bring the newest bottle. Bring bottle, even if empty. 3. Medication refills: You are responsible for knowing and keeping track of what medications you take and those you need refilled. The day before your appointment: write a list of all prescriptions that need to be refilled. The day of the appointment: give the list to the admitting nurse. Prescriptions will be written only during appointments. If you forget a medication: it will not be "Called in", "Faxed", or "electronically sent". You will need to get another appointment to get these prescribed. No early refills. Do not call asking to have your prescription filled early. 4. Prescription Accuracy: You are responsible for  carefully inspecting your prescriptions before leaving our office. Have the discharge nurse carefully go over each prescription with you, before taking them home. Make sure that your name is accurately spelled, that your address is correct. Check the name and dose of your medication to make sure it is accurate. Check the number of pills, and the written instructions to make sure they are clear and accurate. Make sure that you are given enough medication to last until your next medication refill appointment. 5. Taking Medication: Take medication as prescribed. When it comes to controlled substances, taking less pills or less frequently than prescribed is permitted and encouraged. Never take more pills than instructed. Never take medication more frequently than prescribed.  6. Inform other Doctors: Always inform, all of your healthcare providers, of all the medications you take. 7. Pain Medication from other Providers: You are not allowed to accept any additional pain medication from any other Doctor or Healthcare provider. There are two exceptions to this rule. (see below) In the event that you require additional pain medication, you are responsible for notifying us, as stated below. 8. Medication Agreement: You are responsible for carefully reading and following our Medication Agreement. This must be signed before receiving any prescriptions from our practice. Safely store a copy of your signed Agreement. Violations to the Agreement will result in no further prescriptions. (Additional copies of our Medication Agreement are available upon request.) 9. Laws, Rules, & Regulations: All patients are expected to follow all Federal and State Laws, Statutes, Rules, & Regulations. Ignorance of the Laws does not constitute a valid excuse. The use of any illegal substances is prohibited. 10. Adopted CDC guidelines & recommendations: Target dosing levels will be at or below 60 MME/day. Use of benzodiazepines** is not  recommended.  Exceptions: There are only two exceptions to the rule of not receiving pain medications from other Healthcare Providers. 1.   Exception #1 (Emergencies): In the event of an emergency (i.e.: accident requiring emergency care), you are allowed to receive additional pain medication. However, you are responsible for: As soon as you are able, call our office [phone], at any time of the day or night, and leave a message stating your name, the date and nature of the emergency, and the name and dose of the medication prescribed. In the event that your call is answered by a member of our staff, make sure to document and save the date, time, and the name of the person that took your information.  2. Exception #2 (Planned Surgery): In the event that you are scheduled by another doctor or dentist to have any type of surgery or procedure, you are allowed (for a period no longer than 30 days), to receive additional pain medication, for the acute post-op pain. However, in this case, you are responsible for picking up a copy of our "Post-op Pain Management for Surgeons" handout, and giving it to your surgeon or dentist. This document is available at our office, and does not require an appointment to obtain it. Simply go to our office during business hours (Monday-Thursday from 8:00 AM to 4:00 PM) (Friday 8:00 AM to 12:00 Noon) or if you have a scheduled appointment with us, prior to your surgery, and ask for it by name. In addition, you will need to provide us with your name, name of your surgeon, type of surgery, and date of procedure or surgery.  *Opioid medications include: morphine, codeine, oxycodone, oxymorphone, hydrocodone, hydromorphone, meperidine, tramadol, tapentadol, buprenorphine, fentanyl, methadone. **Benzodiazepine medications include: diazepam (Valium), alprazolam (Xanax), clonazepam (Klonopine), lorazepam (Ativan), clorazepate (Tranxene), chlordiazepoxide (Librium), estazolam (Prosom),  oxazepam (Serax), temazepam (Restoril), triazolam (Halcion) (Last updated: 09/15/2017) ____________________________________________________________________________________________   BMI Assessment: Estimated body mass index is 30.41 kg/m as calculated from the following:   Height as of this encounter: 5\' 8"  (1.727 m).   Weight as of this encounter: 200 lb (90.7 kg).  BMI interpretation table: BMI level Category Range association with higher incidence of chronic pain  <18 kg/m2 Underweight   18.5-24.9 kg/m2 Ideal body weight   25-29.9 kg/m2 Overweight Increased incidence by 20%  30-34.9 kg/m2 Obese (Class I) Increased incidence by 68%  35-39.9 kg/m2 Severe obesity (Class II) Increased incidence by 136%  >40 kg/m2 Extreme obesity (Class III) Increased incidence by 254%   Patient's current BMI Ideal Body weight  Body mass index is 30.41 kg/m. Ideal body weight: 63.9 kg (140 lb 14 oz) Adjusted ideal body weight: 74.6 kg (164 lb 8.4 oz)   BMI Readings from Last 4 Encounters:  06/13/18 30.41 kg/m  05/16/18 30.41 kg/m  02/27/18 30.41 kg/m  02/06/18 30.41 kg/m   Wt Readings from Last 4 Encounters:  06/13/18 200 lb (90.7 kg)  05/16/18 200 lb (90.7 kg)  02/27/18 200 lb (90.7 kg)  02/06/18 200 lb (90.7 kg)

## 2018-06-14 ENCOUNTER — Telehealth: Payer: Self-pay

## 2018-06-14 DIAGNOSIS — I10 Essential (primary) hypertension: Secondary | ICD-10-CM

## 2018-06-14 MED ORDER — LOSARTAN POTASSIUM 100 MG PO TABS: 50.0000 mg | ORAL_TABLET | Freq: Two times a day (BID) | ORAL | 0 refills | Status: DC

## 2018-06-14 NOTE — Telephone Encounter (Signed)
Refill sent for Losartan potassium 100 mg take 0.5 mg [facility] a [facility]  The patient needs a follow up appointment before next refill.

## 2018-06-26 NOTE — Progress Notes (Deleted)
NO SHOW

## 2018-06-27 ENCOUNTER — Ambulatory Visit: Payer: Medicaid Other | Admitting: Cardiovascular Disease

## 2018-06-28 ENCOUNTER — Encounter: Payer: Self-pay | Admitting: Cardiovascular Disease

## 2018-06-30 ENCOUNTER — Other Ambulatory Visit: Payer: Self-pay | Admitting: Cardiovascular Disease

## 2018-07-26 ENCOUNTER — Other Ambulatory Visit: Payer: Self-pay | Admitting: Cardiovascular Disease

## 2018-07-28 ENCOUNTER — Telehealth: Payer: Self-pay | Admitting: Cardiovascular Disease

## 2018-07-28 ENCOUNTER — Encounter: Payer: Self-pay | Admitting: Cardiovascular Disease

## 2018-07-28 NOTE — Telephone Encounter (Signed)
lmov

## 2018-07-28 NOTE — Telephone Encounter (Signed)
-----   Message from Aurelio Jew, [place] sent at 07/27/2018  7:24 AM EST ----- Please schedule patient an appointment for refills. Rx for 30 day supply of cozaar sent to pharmacy.

## 2018-08-08 NOTE — Telephone Encounter (Signed)
lmov

## 2018-08-09 ENCOUNTER — Encounter: Payer: Medicaid Other | Admitting: Nurse Practitioner

## 2018-08-12 ENCOUNTER — Emergency Department
Admission: EM | Admit: 2018-08-12 | Discharge: 2018-08-13 | Disposition: A | Discharge status: Home / Self Care | Payer: Medicaid Other | Attending: Emergency Medicine | Admitting: Emergency Medicine

## 2018-08-12 ENCOUNTER — Emergency Department: Payer: Medicaid Other

## 2018-08-12 ENCOUNTER — Encounter: Payer: Self-pay | Admitting: Emergency Medicine

## 2018-08-12 DIAGNOSIS — R1084 Generalized abdominal pain: Secondary | ICD-10-CM | POA: Diagnosis not present

## 2018-08-12 DIAGNOSIS — E119 Type 2 diabetes mellitus without complications: Secondary | ICD-10-CM

## 2018-08-12 DIAGNOSIS — I5032 Chronic diastolic (congestive) heart failure: Secondary | ICD-10-CM | POA: Diagnosis not present

## 2018-08-12 DIAGNOSIS — R112 Nausea with vomiting, unspecified: Secondary | ICD-10-CM | POA: Diagnosis present

## 2018-08-12 DIAGNOSIS — Z7984 Long term (current) use of oral hypoglycemic drugs: Secondary | ICD-10-CM | POA: Diagnosis not present

## 2018-08-12 DIAGNOSIS — Z87891 Personal history of nicotine dependence: Secondary | ICD-10-CM | POA: Diagnosis not present

## 2018-08-12 DIAGNOSIS — I110 Hypertensive heart disease with heart failure: Secondary | ICD-10-CM | POA: Diagnosis not present

## 2018-08-12 DIAGNOSIS — Z79899 Other long term (current) drug therapy: Secondary | ICD-10-CM | POA: Diagnosis not present

## 2018-08-12 DIAGNOSIS — J209 Acute bronchitis, unspecified: Secondary | ICD-10-CM | POA: Diagnosis present

## 2018-08-12 DIAGNOSIS — R740 Nonspecific elevation of levels of transaminase and lactic acid dehydrogenase [LDH]: Secondary | ICD-10-CM | POA: Insufficient documentation

## 2018-08-12 DIAGNOSIS — J440 Chronic obstructive pulmonary disease with acute lower respiratory infection: Secondary | ICD-10-CM

## 2018-08-12 DIAGNOSIS — J449 Chronic obstructive pulmonary disease, unspecified: Secondary | ICD-10-CM | POA: Diagnosis not present

## 2018-08-12 DIAGNOSIS — R197 Diarrhea, unspecified: Secondary | ICD-10-CM

## 2018-08-12 DIAGNOSIS — R7401 Elevation of levels of liver transaminase levels: Secondary | ICD-10-CM | POA: Diagnosis present

## 2018-08-12 DIAGNOSIS — I10 Essential (primary) hypertension: Secondary | ICD-10-CM | POA: Diagnosis present

## 2018-08-12 LAB — ACETAMINOPHEN LEVEL: Acetaminophen (Tylenol), Serum: 11 ug/mL (ref 10–30)

## 2018-08-12 LAB — COMPREHENSIVE METABOLIC PANEL
ALT: 985 U/L — ABNORMAL HIGH (ref 0–44)
AST: 189 U/L — ABNORMAL HIGH (ref 15–41)
Albumin: 3.3 g/dL — ABNORMAL LOW (ref 3.5–5.0)
Alkaline Phosphatase: 216 U/L — ABNORMAL HIGH (ref 38–126)
Anion gap: 11 (ref 5–15)
BUN: 10 mg/dL (ref 6–20)
CHLORIDE: 101 mmol/L (ref 98–111)
CO2: 22 mmol/L (ref 22–32)
Calcium: 9.6 mg/dL (ref 8.9–10.3)
Creatinine, Ser: 0.7 mg/dL (ref 0.44–1.00)
GFR calc Af Amer: >60 mL/min (ref >60)
Glucose, Bld: 174 mg/dL — ABNORMAL HIGH (ref 70–99)
Potassium: 3.1 mmol/L — ABNORMAL LOW (ref 3.5–5.1)
Sodium: 134 mmol/L — ABNORMAL LOW (ref 135–145)
Total Bilirubin: 2.7 mg/dL — ABNORMAL HIGH (ref 0.3–1.2)
Total Protein: 7 g/dL (ref 6.5–8.1)

## 2018-08-12 LAB — PROTIME-INR
INR: 1.05
PROTHROMBIN TIME: 13.6 s (ref 11.4–15.2)

## 2018-08-12 LAB — CBC
HCT: 38 % (ref 36.0–46.0)
Hemoglobin: 12.6 g/dL (ref 12.0–15.0)
MCH: 29.2 pg (ref 26.0–34.0)
MCHC: 33.2 g/dL (ref 30.0–36.0)
MCV: 88 fL (ref 80.0–100.0)
Platelets: 301 10*3/uL (ref 150–400)
RBC: 4.32 MIL/uL (ref 3.87–5.11)
RDW: 17.5 % — ABNORMAL HIGH (ref 11.5–15.5)
WBC: 5.1 10*3/uL (ref 4.0–10.5)
nRBC: 0 % (ref 0.0–0.2)

## 2018-08-12 LAB — LIPASE, BLOOD: Lipase: 54 U/L — ABNORMAL HIGH (ref 11–51)

## 2018-08-12 MED ORDER — FENTANYL CITRATE (PF) 100 MCG/2ML IJ SOLN
50.0000 ug | Freq: Once | INTRAMUSCULAR | Status: AC
  Administered 2018-08-12: 50 ug via INTRAVENOUS
  Filled 2018-08-12: qty 2

## 2018-08-12 MED ORDER — SODIUM CHLORIDE 0.9 % IV BOLUS
1000.0000 mL | Freq: Once | INTRAVENOUS | Status: AC
  Administered 2018-08-12: 1000 mL via INTRAVENOUS

## 2018-08-12 MED ORDER — SODIUM CHLORIDE 0.9% FLUSH: 3.0000 mL | Freq: Once | INTRAVENOUS | Status: DC

## 2018-08-12 MED ORDER — ONDANSETRON HCL 4 MG/2ML IJ SOLN
4.0000 mg | Freq: Once | INTRAMUSCULAR | Status: AC
  Administered 2018-08-12: 4 mg via INTRAVENOUS
  Filled 2018-08-12: qty 2

## 2018-08-12 NOTE — ED Notes (Signed)
Patient ambulated to room commode with a steady gait. 

## 2018-08-12 NOTE — ED Notes (Signed)
Patient ambulated with steady gait to room commode.

## 2018-08-12 NOTE — ED Triage Notes (Signed)
Pt to ED with c/o of N/V that started thurs.

## 2018-08-12 NOTE — ED Notes (Signed)
Patient transported to ultrasound.

## 2018-08-12 NOTE — ED Provider Notes (Signed)
[facility] Emergency Department Provider Note  ____________________________________________  Time seen: Approximately 10:54 PM  I have reviewed the triage vital signs and the nursing notes.   HISTORY  Chief Complaint Emesis and Nausea   HPI Erica [name] is a 53 y.o. female with a history of diastolic CHF, COPD, diabetes, peptic ulcer disease, hypertension, hyperlipidemia who presents for evaluation of nausea and vomiting and diarrhea.  Patient reports that her symptoms started 2 days ago.  She reports more than 10 daily episodes of nonbloody nonbilious emesis and a few episodes of watery diarrhea.  She is also complaining of cramping diffuse abdominal pain which has been constant and moderate in intensity.  No melena, hematemesis, hematochezia, coffee-ground emesis, fever or chills.  Patient denies history of alcohol abuse.  She denies history of hepatitis.  She does report taking 1030m of tylenol 3 times a day several days a week for chronic back pain.    Past Medical History:  Diagnosis Date  . (HFpEF) heart failure with preserved ejection fraction (HSoper    a. 05/2015 Echo: EF 60-65%, no rwma, PASP 540mg.  . Arthritis   . Asthma   . Bell's palsy   . Bronchitis   . CHF (congestive heart failure) (HCMinong  . COPD (chronic obstructive pulmonary disease) (HCCottonwood [place]  . Diabetes mellitus without complication (HCWel[place]   type II 03/2017  . Gastric ulcer   . Hyperlipidemia   . Hypertension   . OSA (obstructive sleep apnea)    a. did not tolerate CPAP.  . [place] injury    6/19    Patient Active Problem List   Diagnosis Date Noted  . Obesity (BMI 30.0-34.9) 12/05/2017  . Primary localized osteoarthrosis, pelvic region and thigh 12/05/2017  . Arthropathy of left hip 11/08/2017  . Rib pain on right side 11/08/2017  . Spondylosis without myelopathy or radiculopathy, lumbosacral region 10/06/2017  . Vertigo 09/15/2017  . Diabetes (HCAuburn12/31/2018  . HTN  (hypertension) 07/18/2017  . Chronic hip pain (Secondary source of pain) (Bilateral) (L>R) 05/02/2017  . 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 Hemarthrosis, left knee 11/10/2016  . Osteoarthritis of knee (Bilateral) (L>R) 09/08/2016  . Intermittent left thoracic Muscle cramps 09/08/2016  . Spasm of thoracolumbar muscle (Left) 09/08/2016  . Post-traumatic osteoarthritis of knee (Left) 09/08/2016  . Lumbar spondylosis 07/05/2016  . Chronic pain syndrome 06/28/2016  . Elevated sedimentation rate 06/28/2016  . Elevated C-reactive protein (CRP) 06/28/2016  . Neurogenic pain 06/28/2016  . Vitamin D deficiency 06/28/2016  . Long term current use of opiate analgesic 04/29/2016  . Long term prescription opiate use 04/29/2016  . Opiate use 04/29/2016  . Chronic low back pain (Primary Source of Pain) (Bilateral) (L>R) 04/29/2016  . Chronic knee pain ([facility]) (Bilateral) (L>R) 04/29/2016  . Chronic neck pain (Bilateral) (R>L) 04/29/2016  . Chronic upper back pain (midline) 04/29/2016  . Chronic foot pain (bottom of feet) (Bilateral) (R>L) 04/29/2016  . Chronic hand pain (Bilateral) 04/29/2016  . Peripheral neuropathy, idiopathic (upper and lower extremity) 04/29/2016  . Chronic sacroiliac joint pain (Bilateral) (R>L) 04/29/2016  . Lumbar facet syndrome (Bilateral) (R>L) 04/29/2016  . Depression 03/05/2016  . Chronic diastolic heart failure (HCWheatland12/28/2016  . Smoker 07/16/2015  . Obstructive sleep apnea 07/16/2015  . Tachycardia 07/16/2015  . COPD, mild (HCNormangee  . Renal insufficiency   . Pulmonary HTN (HCDouble [place]  . Chest pain 06/14/2015  . Hyponatremia 06/14/2015  . Shortness  of breath   . Swelling   . Cough   . COPD (chronic obstructive pulmonary disease) with acute bronchitis ([place])     Past Surgical History:  Procedure Laterality Date  . FLEXIBLE BRONCHOSCOPY N/A 06/20/2015   Procedure: FLEXIBLE BRONCHOSCOPY;  Surgeon: Laverle Hobby, MD;  Location:  [facility] ORS;  Service: Pulmonary;  Laterality: N/A;  . KNEE SURGERY Left    8 knee surgeries    Prior to Admission medications   Medication Sig Start Date End Date Taking? Authorizing Provider  albuterol (PROAIR HFA) 108 (90 Base) MCG/ACT inhaler Inhale into the lungs 4 (four) times daily.    [provider]  aspirin EC 81 MG EC tablet Take 1 tablet (81 mg total) by mouth daily. 06/23/15   Demetrios Loll, MD  atorvastatin (LIPITOR) 80 MG tablet Take 1 tablet (80 mg total) by mouth daily. 05/04/18   Gregor Hams, MD  Blood Glucose Monitoring Suppl (ACCU-CHEK AVIVA PLUS) w/Device KIT See admin instructions. 10/20/17   [provider]  Cholecalciferol (VITAMIN D3) 2000 units capsule Take 1 capsule (2,000 Units total) by mouth daily. 02/27/18   Vevelyn Francois, NP  diltiazem (CARDIZEM) 30 MG tablet TAKE 1 TABLET(30 MG) BY MOUTH THREE TIMES DAILY AS NEEDED 07/27/18   Minna Merritts, MD  diltiazem (CARTIA XT) 120 MG 24 hr capsule Take 1 capsule (120 mg total) by mouth daily. 05/04/18   Gregor Hams, MD  DULoxetine (CYMBALTA) 30 MG capsule Take 1 capsule (30 mg total) by mouth daily. 05/04/18   Gregor Hams, MD  DULoxetine (CYMBALTA) 60 MG capsule Take 60 mg by mouth daily.    [provider]  fluconazole (DIFLUCAN) 150 MG tablet TAKE 1 TABLET NOW FOR YEAST INFECTION, MAY REPEAT IN 3 DAYS IF SYMPTOMS PERSIST 10/17/17   [provider]  Fluticasone-Salmeterol (ADVAIR DISKUS IN) Inhale into the lungs 2 (two) times daily.    [provider]  folic acid (FOLVITE) 1 MG tablet Take 1 mg by mouth daily.    [provider]  furosemide (LASIX) 40 MG tablet Take 1 tablet (40 mg total) by mouth daily. Take an additional 40 mg in the am for weight gain as needed 05/04/18   Gregor Hams, MD  losartan (COZAAR) 100 MG tablet Take 0.5 tablets (50 mg total) by mouth 2 (two) times daily. Take 1/2 tablet (50 mg) by mouth twice daily 06/14/18   Theora Gianotti, NP  Magnesium Oxide, Antacid, 500 MG CAPS once daily. 11/08/16   [provider]  metFORMIN (GLUCOPHAGE) 500 MG tablet Take 1 tablet (500 mg total) by mouth 2 (two) times daily with a meal. 05/04/18   Gregor Hams, MD  oxyCODONE (OXY IR/ROXICODONE) 5 MG immediate release tablet Take 1 tablet (5 mg total) by mouth every 8 (eight) hours as needed for severe pain. 06/15/18 07/15/18  Vevelyn Francois, NP  oxyCODONE (OXY IR/ROXICODONE) 5 MG immediate release tablet Take 1 tablet (5 mg total) by mouth every 8 (eight) hours as needed for severe pain. 07/15/18 08/14/18  Vevelyn Francois, NP  OXYGEN Inhale into the lungs. 2 liters at bedtime    [provider]  pantoprazole (PROTONIX) 40 MG tablet Take 1 tablet (40 mg total) by mouth daily. 05/04/18   Gregor Hams, MD  potassium chloride SA (K-DUR,KLOR-CON) 20 MEQ tablet Take 20 mEq by mouth 2 (two) times daily.    [provider]  pregabalin (LYRICA) 75 MG capsule Take 1  capsule (75 mg total) by mouth 3 (three) times daily. 06/15/18 08/14/18  Vevelyn Francois, NP  RA MAGNESIUM 500 MG CAPS Take 1 capsule by mouth 2 (two) times daily. 01/24/18   [provider]  sucralfate (CARAFATE) 1 G tablet Take 1 tablet (1 g total) by mouth 4 (four) times daily -  with meals and at bedtime. Patient taking differently: Take 1 g by mouth 2 (two) times daily.  06/23/15   Demetrios Loll, MD  tiotropium (SPIRIVA HANDIHALER) 18 MCG inhalation capsule Place 18 mcg into inhaler and inhale daily.    [provider]  tiZANidine (ZANAFLEX) 4 MG capsule Take 1 capsule (4 mg total) by mouth 3 (three) times daily as needed for muscle spasms. 06/15/18 07/15/18  Vevelyn Francois, NP    Allergies Tramadol and Penicillins  Family History  Problem Relation Age of Onset  . Lung cancer Father   . Breast cancer Mother        early 44's  . Diabetes Maternal Grandmother     Social History Social History   Tobacco Use  .  Smoking status: Former Smoker    Packs/day: 1.00    Years: 36.00    Pack years: 36.00    Types: Cigarettes    Last attempt to quit: 06/13/2015    Years since quitting: 3.1  . Smokeless tobacco: Never Used  Substance Use Topics  . Alcohol use: No    Alcohol/week: 0.0 standard drinks    Frequency: Never    Comment: occasionaly  . Drug use: No    Review of Systems  Constitutional: Negative for fever. Eyes: Negative for visual changes. ENT: Negative for sore throat. Neck: No neck pain  Cardiovascular: Negative for chest pain. Respiratory: Negative for shortness of breath. Gastrointestinal: + abdominal pain, vomiting  diarrhea. Genitourinary: Negative for dysuria. Musculoskeletal: Negative for back pain. Skin: Negative for rash. Neurological: Negative for headaches, weakness or numbness. Psych: No SI or HI  ____________________________________________   PHYSICAL EXAM:  VITAL SIGNS: ED Triage Vitals [08/12/18 1815]  Enc Vitals Group     BP (!) 176/91     Pulse Rate (!) 104     Resp 20     Temp 98.1 F (36.7 C)     Temp Source Oral     SpO2 99 %     Weight 200 lb (90.7 kg)     Height _0  (1.727 m)     Head Circumference      Peak Flow      Pain Score 9     Pain Loc      Pain Edu?      Excl. in [place]?     Constitutional: Alert and oriented. Well appearing and in no apparent distress. HEENT:      Head: Normocephalic and atraumatic.         Eyes: Conjunctivae are normal. Sclera is non-icteric.       Mouth/Throat: Mucous membranes are moist.       Neck: Supple with no signs of meningismus. Cardiovascular: Tachycardia with regular rhythm. No murmurs, gallops, or rubs. 2+ symmetrical distal pulses are present in all extremities. No JVD. Respiratory: Normal respiratory effort. Lungs are clear to auscultation bilaterally. No wheezes, crackles, or rhonchi.  Gastrointestinal: Soft, mild diffuse tenderness to palpation over the upper quadrants, and non distended with  positive bowel sounds. No rebound or guarding. Musculoskeletal: Nontender with normal range of motion in all extremities. No edema, cyanosis, or erythema of extremities. Neurologic:  Normal speech and language. Face is symmetric. Moving all extremities. No gross focal neurologic deficits are appreciated. Skin: Skin is warm, dry and intact. No rash noted. Psychiatric: Mood and affect are normal. Speech and behavior are normal.  ____________________________________________   LABS (all labs ordered are listed, but only abnormal results are displayed)  Labs Reviewed  LIPASE, BLOOD - Abnormal; Notable for the following components:      Result Value   Lipase 54 (*)    All other components within normal limits  COMPREHENSIVE METABOLIC PANEL - Abnormal; Notable for the following components:   Sodium 134 (*)    Potassium 3.1 (*)    Glucose, Bld 174 (*)    Albumin 3.3 (*)    AST 189 (*)    ALT 985 (*)    Alkaline Phosphatase 216 (*)    Total Bilirubin 2.7 (*)    All other components within normal limits  CBC - Abnormal; Notable for the following components:   RDW 17.5 (*)    All other components within normal limits  ACETAMINOPHEN LEVEL  PROTIME-INR  URINALYSIS, COMPLETE (UACMP) WITH MICROSCOPIC  HEPATITIS PANEL, ACUTE   ____________________________________________  EKG  none  ____________________________________________  RADIOLOGY  I have personally reviewed the images performed during this visit and I agree with the Radiologist's read.   Interpretation by Radiologist:  US Abdomen Limited Ruq  Result Date: 08/12/2018 CLINICAL DATA:  Transaminitis. EXAM: ULTRASOUND ABDOMEN LIMITED RIGHT UPPER QUADRANT COMPARISON:  None. FINDINGS: Gallbladder: No gallstones or wall thickening visualized. Gallbladder sludge is present. No sonographic Murphy sign noted by sonographer. Common bile duct: Diameter: 4.2 mm Liver: No focal lesion identified. Mildly increased parenchymal echogenicity.  Portal vein is patent on color Doppler imaging with normal direction of blood flow towards the liver. IMPRESSION: Gallbladder sludge without evidence of acute cholecystitis. Mildly increased parenchymal echogenicity of the liver, usually associated with hepatic steatosis or fibrosis. Electronically Signed   By: Fidela Salisbury M.D.   On: 08/12/2018 22:10     ____________________________________________   PROCEDURES  Procedure(s) performed: None Procedures Critical Care performed:  None ____________________________________________   INITIAL IMPRESSION / ASSESSMENT AND PLAN / ED COURSE   53 y.o. female with a history of diastolic CHF, COPD, diabetes, peptic ulcer disease, hypertension, hyperlipidemia who presents for evaluation of nausea and vomiting and diarrhea.   Patient is well-appearing, mildly tachycardic, afebrile, remainder of her vital signs are within normal limits, abdomen is soft with mild diffuse tenderness on the upper quadrants, negative Murphy sign.  Labs showing new transaminitis with AST of 189, ALT of 95, T bili of 2.7, lipase of 54.  Patient does not drink alcohol.  Does report some Tylenol use but not significant.  Tylenol level was negative.  Normal white count.  Right upper quadrant ultrasound showing gallbladder sludge with no evidence of acute cholecystitis.  Discussed with Dr. Peyton Najjar from surgery who recommended medical admission for evaluation of acute viral hepatitis.  Discussed with Dr. Marius Ditch from GI who agrees with this plan.  Discussed with Dr. Jannifer Franklin for admission.  Acute hepatitis panel was pending.      As part of my medical decision making, I reviewed the following data within the [place] notes reviewed and incorporated, Labs reviewed , Old chart reviewed, Radiograph reviewed , Discussed with admitting physician , A consult was requested and obtained from this/these consultant(s) GI and Surgery, Notes from prior ED visits  and [place] Controlled Substance Database    Pertinent labs &  imaging results that were available during my care of the patient were reviewed by me and considered in my medical decision making (see chart for details).    ____________________________________________   FINAL CLINICAL IMPRESSION(S) / ED DIAGNOSES  Final diagnoses:  Transaminitis      NEW MEDICATIONS STARTED DURING THIS VISIT:  ED Discharge Orders    None       Note:  This document was prepared using Dragon voice recognition software and may include unintentional dictation errors.    Rudene Re, MD 08/12/18 [id number]

## 2018-08-12 NOTE — ED Notes (Signed)
Patient c/o nasal congestion with green mucous and blood.

## 2018-08-13 DIAGNOSIS — R945 Abnormal results of liver function studies: Secondary | ICD-10-CM

## 2018-08-13 DIAGNOSIS — K859 Acute pancreatitis without necrosis or infection, unspecified: Secondary | ICD-10-CM

## 2018-08-13 DIAGNOSIS — R112 Nausea with vomiting, unspecified: Secondary | ICD-10-CM | POA: Diagnosis present

## 2018-08-13 DIAGNOSIS — J440 Chronic obstructive pulmonary disease with acute lower respiratory infection: Secondary | ICD-10-CM | POA: Insufficient documentation

## 2018-08-13 DIAGNOSIS — R7401 Elevation of levels of liver transaminase levels: Secondary | ICD-10-CM | POA: Diagnosis present

## 2018-08-13 DIAGNOSIS — J449 Chronic obstructive pulmonary disease, unspecified: Secondary | ICD-10-CM | POA: Insufficient documentation

## 2018-08-13 DIAGNOSIS — R7989 Other specified abnormal findings of blood chemistry: Secondary | ICD-10-CM | POA: Insufficient documentation

## 2018-08-13 DIAGNOSIS — R740 Nonspecific elevation of levels of transaminase and lactic acid dehydrogenase [LDH]: Secondary | ICD-10-CM

## 2018-08-13 DIAGNOSIS — K279 Peptic ulcer, site unspecified, unspecified as acute or chronic, without hemorrhage or perforation: Secondary | ICD-10-CM | POA: Insufficient documentation

## 2018-08-13 DIAGNOSIS — J209 Acute bronchitis, unspecified: Secondary | ICD-10-CM | POA: Insufficient documentation

## 2018-08-13 DIAGNOSIS — R197 Diarrhea, unspecified: Secondary | ICD-10-CM

## 2018-08-13 HISTORY — DX: Acute pancreatitis without necrosis or infection, unspecified: K85.90

## 2018-08-13 LAB — URINALYSIS, COMPLETE (UACMP) WITH MICROSCOPIC
Bilirubin Urine: NEGATIVE
Glucose, UA: NEGATIVE mg/dL
Hgb urine dipstick: NEGATIVE
Ketones, ur: NEGATIVE mg/dL
LEUKOCYTES UA: NEGATIVE
Nitrite: NEGATIVE
Protein, ur: NEGATIVE mg/dL
Specific Gravity, Urine: 1.009 (ref 1.005–1.030)
pH: 7 (ref 5.0–8.0)

## 2018-08-13 MED ORDER — DILTIAZEM HCL ER COATED BEADS 120 MG PO CP24
120.0000 mg | ORAL_CAPSULE | Freq: Once | ORAL | Status: DC
  Filled 2018-08-13: qty 1

## 2018-08-13 MED ORDER — DILTIAZEM HCL 60 MG PO TABS
30.0000 mg | ORAL_TABLET | Freq: Once | ORAL | Status: AC
  Administered 2018-08-13: 30 mg via ORAL
  Filled 2018-08-13: qty 1

## 2018-08-13 MED ORDER — NYSTATIN 100000 UNIT/ML MT SUSP
5.0000 mL | Freq: Four times a day (QID) | OROMUCOSAL | Status: DC
  Filled 2018-08-13: qty 5

## 2018-08-13 NOTE — ED Provider Notes (Signed)
-----------------------------------------   2:07 AM on 08/13/2018 -----------------------------------------  Patient desires discharge home.  States she feels fine and will follow-up with her PCP.  Hospitalist services has not yet admitted the patient.  We discussed risk/benefits of her leaving including permanent disability including death.  She will follow-up with her PCP and I will also refer her to Dr. Allegra Lai from GI. Very strict precautions given. Patient and her family verbalize understanding and agree with plan of care.   Irean Hong, MD 08/13/18 0700

## 2018-08-13 NOTE — Discharge Instructions (Addendum)
You are leaving AGAINST MEDICAL ADVICE.  You may experience worsening symptoms including permanent liver failure and death.  Please follow-up with your doctor and the GI doctor listed above.  Your hepatitis panel is pending and you will be notified of any positive results.  Please return to the ER for worsening symptoms, persistent vomiting, difficulty breathing or other concerns.

## 2018-08-14 LAB — HEPATITIS PANEL, ACUTE
HCV Ab: <0.1 s/co ratio (ref 0.0–0.9)
HEP A IGM: NEGATIVE
Hep B C IgM: NEGATIVE
Hepatitis B Surface Ag: NEGATIVE

## 2018-08-15 ENCOUNTER — Telehealth: Payer: Self-pay | Admitting: Pain Medicine

## 2018-08-15 ENCOUNTER — Other Ambulatory Visit: Payer: Self-pay | Admitting: Cardiovascular Disease

## 2018-08-15 NOTE — Telephone Encounter (Signed)
Will document in chart after patient informs us on what medication she was sent home on. [facility]

## 2018-08-15 NOTE — Telephone Encounter (Signed)
Patient called stating she is in [facility] hospital with pancreatitis, physician told her they may be sending her home with some medications. Informed patient to call when she gets out and let us know what the meds are.

## 2018-08-17 NOTE — Telephone Encounter (Signed)
Scheduled 3/9 with Dr Mariah Milling

## 2018-08-29 ENCOUNTER — Encounter: Payer: Self-pay | Admitting: Nurse Practitioner

## 2018-08-29 ENCOUNTER — Ambulatory Visit: Payer: Medicaid Other | Attending: Nurse Practitioner | Admitting: Nurse Practitioner

## 2018-08-29 ENCOUNTER — Other Ambulatory Visit: Payer: Self-pay

## 2018-08-29 VITALS — BP 137/78 | HR 109 | Temp 98.6°F | Resp 18 | Ht 68.0 in | Wt 193.0 lb

## 2018-08-29 DIAGNOSIS — M47816 Spondylosis without myelopathy or radiculopathy, lumbar region: Secondary | ICD-10-CM | POA: Insufficient documentation

## 2018-08-29 DIAGNOSIS — G894 Chronic pain syndrome: Secondary | ICD-10-CM | POA: Diagnosis present

## 2018-08-29 DIAGNOSIS — G609 Hereditary and idiopathic neuropathy, unspecified: Secondary | ICD-10-CM | POA: Diagnosis present

## 2018-08-29 DIAGNOSIS — Z79891 Long term (current) use of opiate analgesic: Secondary | ICD-10-CM | POA: Insufficient documentation

## 2018-08-29 DIAGNOSIS — M792 Neuralgia and neuritis, unspecified: Secondary | ICD-10-CM | POA: Diagnosis present

## 2018-08-29 MED ORDER — PREGABALIN 75 MG PO CAPS: 75.0000 mg | ORAL_CAPSULE | Freq: Three times a day (TID) | ORAL | 0 refills | Status: DC

## 2018-08-29 MED ORDER — TIZANIDINE HCL 4 MG PO TABS: 4.0000 mg | ORAL_TABLET | Freq: Three times a day (TID) | ORAL | 0 refills | Status: DC

## 2018-08-29 MED ORDER — OXYCODONE HCL 5 MG PO TABS: 5.0000 mg | ORAL_TABLET | Freq: Three times a day (TID) | ORAL | 0 refills | Status: DC | PRN

## 2018-08-29 NOTE — Patient Instructions (Addendum)
____________________________________________________________________________________________  Medication Rules  Purpose: To inform patients, and their family members, of our rules and regulations.  Applies to: All patients receiving prescriptions (written or electronic).  Pharmacy of record: Pharmacy where electronic prescriptions will be sent. If written prescriptions are taken to a different pharmacy, please inform the nursing staff. The pharmacy listed in the electronic medical record should be the one where you would like electronic prescriptions to be sent.  Electronic prescriptions: In compliance with the [place] Strengthen Opioid Misuse Prevention (STOP) Act of 2017 (Session Law 2017-74/H243), effective July 19, 2018, all controlled substances must be electronically prescribed. Calling prescriptions to the pharmacy will cease to exist.  Prescription refills: Only during scheduled appointments. Applies to all prescriptions.  NOTE: The following applies primarily to controlled substances (Opioid* Pain Medications).   Patient's responsibilities: 1. Pain Pills: Bring all pain pills to every appointment (except for procedure appointments). 2. Pill Bottles: Bring pills in original pharmacy bottle. Always bring the newest bottle. Bring bottle, even if empty. 3. Medication refills: You are responsible for knowing and keeping track of what medications you take and those you need refilled. The day before your appointment: write a list of all prescriptions that need to be refilled. The day of the appointment: give the list to the admitting nurse. Prescriptions will be written only during appointments. No prescriptions will be written on procedure days. If you forget a medication: it will not be "Called in", "Faxed", or "electronically sent". You will need to get another appointment to get these prescribed. No early refills. Do not call asking to have your prescription filled  early. 4. Prescription Accuracy: You are responsible for carefully inspecting your prescriptions before leaving our office. Have the discharge nurse carefully go over each prescription with you, before taking them home. Make sure that your name is accurately spelled, that your address is correct. Check the name and dose of your medication to make sure it is accurate. Check the number of pills, and the written instructions to make sure they are clear and accurate. Make sure that you are given enough medication to last until your next medication refill appointment. 5. Taking Medication: Take medication as prescribed. When it comes to controlled substances, taking less pills or less frequently than prescribed is permitted and encouraged. Never take more pills than instructed. Never take medication more frequently than prescribed.  6. Inform other Doctors: Always inform, all of your healthcare providers, of all the medications you take. 7. Pain Medication from other Providers: You are not allowed to accept any additional pain medication from any other Doctor or Healthcare provider. There are two exceptions to this rule. (see below) In the event that you require additional pain medication, you are responsible for notifying us, as stated below. 8. Medication Agreement: You are responsible for carefully reading and following our Medication Agreement. This must be signed before receiving any prescriptions from our practice. Safely store a copy of your signed Agreement. Violations to the Agreement will result in no further prescriptions. (Additional copies of our Medication Agreement are available upon request.) 9. Laws, Rules, & Regulations: All patients are expected to follow all Federal and State Laws, Statutes, Rules, & Regulations. Ignorance of the Laws does not constitute a valid excuse. The use of any illegal substances is prohibited. 10. Adopted CDC guidelines & recommendations: Target dosing levels will be  at or below 60 MME/day. Use of benzodiazepines** is not recommended.  Exceptions: There are only two exceptions to the rule of not   receiving pain medications from other Healthcare Providers. 1. Exception #1 (Emergencies): In the event of an emergency (i.e.: accident requiring emergency care), you are allowed to receive additional pain medication. However, you are responsible for: As soon as you are able, call our office [phone], at any time of the day or night, and leave a message stating your name, the date and nature of the emergency, and the name and dose of the medication prescribed. In the event that your call is answered by a member of our staff, make sure to document and save the date, time, and the name of the person that took your information.  2. Exception #2 (Planned Surgery): In the event that you are scheduled by another doctor or dentist to have any type of surgery or procedure, you are allowed (for a period no longer than 30 days), to receive additional pain medication, for the acute post-op pain. However, in this case, you are responsible for picking up a copy of our "Post-op Pain Management for Surgeons" handout, and giving it to your surgeon or dentist. This document is available at our office, and does not require an appointment to obtain it. Simply go to our office during business hours (Monday-Thursday from 8:00 AM to 4:00 PM) (Friday 8:00 AM to 12:00 Noon) or if you have a scheduled appointment with us, prior to your surgery, and ask for it by name. In addition, you will need to provide us with your name, name of your surgeon, type of surgery, and date of procedure or surgery.  *Opioid medications include: morphine, codeine, oxycodone, oxymorphone, hydrocodone, hydromorphone, meperidine, tramadol, tapentadol, buprenorphine, fentanyl, methadone. **Benzodiazepine medications include: diazepam (Valium), alprazolam (Xanax), clonazepam (Klonopine), lorazepam (Ativan), clorazepate  (Tranxene), chlordiazepoxide (Librium), estazolam (Prosom), oxazepam (Serax), temazepam (Restoril), triazolam (Halcion) (Last updated: 09/15/2017) ____________________________________________________________________________________________   ____________________________________________________________________________________________  Appointment Policy Summary  It is our goal and responsibility to provide the medical community with assistance in the evaluation and management of patients with chronic pain. Unfortunately our resources are limited. Because we do not have an unlimited amount of time, or available appointments, we are required to closely monitor and manage their use. The following rules exist to maximize their use:  Patient's responsibilities: 1. Punctuality:  At what time should I arrive? You should be physically present in our office 30 minutes before your scheduled appointment. Your scheduled appointment is with your assigned healthcare provider. However, it takes 5-10 minutes to be "checked-in", and another 15 minutes for the nurses to do the admission. If you arrive to our office at the time you were given for your appointment, you will end up being at least 20-25 minutes late to your appointment with the provider. 2. Tardiness:  What happens if I arrive only a few minutes after my scheduled appointment time? You will need to reschedule your appointment. The cutoff is your appointment time. This is why it is so important that you arrive at least 30 minutes before that appointment. If you have an appointment scheduled for 10:00 AM and you arrive at 10:01, you will be required to reschedule your appointment.  3. Plan ahead:  Always assume that you will encounter traffic on your way in. Plan for it. If you are dependent on a driver, make sure they understand these rules and the need to arrive early. 4. Other appointments and responsibilities:  Avoid scheduling any other appointments  before or after your pain clinic appointments.  5. Be prepared:  Write down everything that you need to discuss   with your healthcare provider and give this information to the admitting nurse. Write down the medications that you will need refilled. Bring your pills and bottles (even the empty ones), to all of your appointments, except for those where a procedure is scheduled. 6. No children or pets:  Find someone to take care of them. It is not appropriate to bring them in. 7. Scheduling changes:  We request "advanced notification" of any changes or cancellations. 8. Advanced notification:  Defined as a time period of more than 24 hours prior to the originally scheduled appointment. This allows for the appointment to be offered to other patients. 9. Rescheduling:  When a visit is rescheduled, it will require the cancellation of the original appointment. For this reason they both fall within the category of "Cancellations".  10. Cancellations:  They require advanced notification. Any cancellation less than 24 hours before the  appointment will be recorded as a "No Show". 11. No Show:  Defined as an unkept appointment where the patient failed to notify or declare to the practice their intention or inability to keep the appointment.  Corrective process for repeat offenders:  1. Tardiness: Three (3) episodes of rescheduling due to late arrivals will be recorded as one (1) "No Show". 2. Cancellation or reschedule: Three (3) cancellations or rescheduling will be recorded as one (1) "No Show". 3. "No Shows": Three (3) "No Shows" within a 12 month period will result in discharge from the practice. ____________________________________________________________________________________________   ______________________________________________________________________________________________  Specialty Pain Scale  Introduction:  There are significant differences in how pain is reported. The word pain  usually refers to physical pain, but it is also a common synonym of suffering. The medical community uses a scale from 0 (zero) to 10 (ten) to report pain level. Zero (0) is described as "no pain", while ten (10) is described as "the worse pain you can imagine". The problem with this scale is that physical pain is reported along with suffering. Suffering refers to mental pain, or more often yet it refers to any unpleasant feeling, emotion or aversion associated with the perception of harm or threat of harm. It is the psychological component of pain.  Pain Specialists prefer to separate the two components. The pain scale used by this practice is the Verbal Numerical Rating Scale (VNRS-11). This scale is for the physical pain only. DO NOT INCLUDE how your pain psychologically affects you. This scale is for adults 54 years of age and older. It has 11 (eleven) levels. The 1st level is 0/10. This means: "right now, I have no pain". In the context of pain management, it also means: "right now, my physical pain is under control with the current therapy".  General Information:  The scale should reflect your current level of pain. Unless you are specifically asked for the level of your worst pain, or your average pain. If you are asked for one of these two, then it should be understood that it is over the past 24 hours.  Levels 1 (one) through 5 (five) are described below, and can be treated as an outpatient. Ambulatory pain management facilities such as ours are more than adequate to treat these levels. Levels 6 (six) through 10 (ten) are also described below, however, these must be treated as a hospitalized patient. While levels 6 (six) and 7 (seven) may be evaluated at an urgent care facility, levels 8 (eight) through 10 (ten) constitute medical emergencies and as such, they belong in a hospital's emergency department. When  having these levels (as described below), do not come to our office. Our facility is not  equipped to manage these levels. Go directly to an urgent care facility or an emergency department to be evaluated.  Definitions:  Activities of Daily Living (ADL): Activities of daily living (ADL or ADLs) is a term used in healthcare to refer to people's daily self-care activities. Health professionals often use a person's ability or inability to perform ADLs as a measurement of their functional status, particularly in regard to people post injury, with disabilities and the elderly. There are two ADL levels: Basic and Instrumental. Basic Activities of Daily Living (BADL  or BADLs) consist of self-care tasks that include: Bathing and showering; personal hygiene and grooming (including brushing/combing/styling hair); dressing; Toilet hygiene (getting to the toilet, cleaning oneself, and getting back up); eating and self-feeding (not including cooking or chewing and swallowing); functional mobility, often referred to as "transferring", as measured by the ability to walk, get in and out of bed, and get into and out of a chair; the broader definition (moving from one place to another while performing activities) is useful for people with different physical abilities who are still able to get around independently. Basic ADLs include the things many people do when they get up in the morning and get ready to go out of the house: get out of bed, go to the toilet, bathe, dress, groom, and eat. On the average, loss of function typically follows a particular order. Hygiene is the first to go, followed by loss of toilet use and locomotion. The last to go is the ability to eat. When there is only one remaining area in which the person is independent, there is a 62.9% chance that it is eating and only a 3.5% chance that it is hygiene. Instrumental Activities of Daily Living (IADL or IADLs) are not necessary for fundamental functioning, but they let an individual live independently in a community. IADL consist of tasks that  include: cleaning and maintaining the house; home establishment and maintenance; care of others (including selecting and supervising caregivers); care of pets; child rearing; managing money; managing financials (investments, etc.); meal preparation and cleanup; shopping for groceries and necessities; moving within the community; safety procedures and emergency responses; health management and maintenance (taking prescribed medications); and using the telephone or other form of communication.  Instructions:  Most patients tend to report their pain as a combination of two factors, their physical pain and their psychosocial pain. This last one is also known as "suffering" and it is reflection of how physical pain affects you socially and psychologically. From now on, report them separately.  From this point on, when asked to report your pain level, report only your physical pain. Use the following table for reference.  Pain Clinic Pain Levels (0-5/10)  Pain Level Score  Description  No Pain 0   Mild pain 1 Nagging, annoying, but does not interfere with basic activities of daily living (ADL). Patients are able to eat, bathe, get dressed, toileting (being able to get on and off the toilet and perform personal hygiene functions), transfer (move in and out of bed or a chair without assistance), and maintain continence (able to control bladder and bowel functions). Blood pressure and heart rate are unaffected. A normal heart rate for a healthy adult ranges from 60 to 100 bpm (beats per minute).   Mild to moderate pain 2 Noticeable and distracting. Impossible to hide from other people. More frequent flare-ups. Still possible to  adapt and function close to normal. It can be very annoying and may have occasional stronger flare-ups. With discipline, patients may get used to it and adapt.   Moderate pain 3 Interferes significantly with activities of daily living (ADL). It becomes difficult to feed, bathe, get  dressed, get on and off the toilet or to perform personal hygiene functions. Difficult to get in and out of bed or a chair without assistance. Very distracting. With effort, it can be ignored when deeply involved in activities.   Moderately severe pain 4 Impossible to ignore for more than a few minutes. With effort, patients may still be able to manage work or participate in some social activities. Very difficult to concentrate. Signs of autonomic nervous system discharge are evident: dilated pupils (mydriasis); mild sweating (diaphoresis); sleep interference. Heart rate becomes elevated (>115 bpm). Diastolic blood pressure (lower number) rises above 100 mmHg. Patients find relief in laying down and not moving.   Severe pain 5 Intense and extremely unpleasant. Associated with frowning face and frequent crying. Pain overwhelms the senses.  Ability to do any activity or maintain social relationships becomes significantly limited. Conversation becomes difficult. Pacing back and forth is common, as getting into a comfortable position is nearly impossible. Pain wakes you up from deep sleep. Physical signs will be obvious: pupillary dilation; increased sweating; goosebumps; brisk reflexes; cold, clammy hands and feet; nausea, vomiting or dry heaves; loss of appetite; significant sleep disturbance with inability to fall asleep or to remain asleep. When persistent, significant weight loss is observed due to the complete loss of appetite and sleep deprivation.  Blood pressure and heart rate becomes significantly elevated. Caution: If elevated blood pressure triggers a pounding headache associated with blurred vision, then the patient should immediately seek attention at an urgent or emergency care unit, as these may be signs of an impending stroke.    Emergency Department Pain Levels (6-10/10)  Emergency Room Pain 6 Severely limiting. Requires emergency care and should not be seen or managed at an outpatient pain  management facility. Communication becomes difficult and requires great effort. Assistance to reach the emergency department may be required. Facial flushing and profuse sweating along with potentially dangerous increases in heart rate and blood pressure will be evident.   Distressing pain 7 Self-care is very difficult. Assistance is required to transport, or use restroom. Assistance to reach the emergency department will be required. Tasks requiring coordination, such as bathing and getting dressed become very difficult.   Disabling pain 8 Self-care is no longer possible. At this level, pain is disabling. The individual is unable to do even the most "basic" activities such as walking, eating, bathing, dressing, transferring to a bed, or toileting. Fine motor skills are lost. It is difficult to think clearly.   Incapacitating pain 9 Pain becomes incapacitating. Thought processing is no longer possible. Difficult to remember your own name. Control of movement and coordination are lost.   The worst pain imaginable 10 At this level, most patients pass out from pain. When this level is reached, collapse of the autonomic nervous system occurs, leading to a sudden drop in blood pressure and heart rate. This in turn results in a temporary and dramatic drop in blood flow to the brain, leading to a loss of consciousness. Fainting is one of the body's self defense mechanisms. Passing out puts the brain in a calmed state and causes it to shut down for a while, in order to begin the healing process.    Summary:  1. Refer to this scale when providing us with your pain level. 2. Be accurate and careful when reporting your pain level. This will help with your care. 3. Over-reporting your pain level will lead to loss of credibility. 4. Even a level of 1/10 means that there is pain and will be treated at our facility. 5. High, inaccurate reporting will be documented as "Symptom Exaggeration", leading to loss of  credibility and suspicions of possible secondary gains such as obtaining more narcotics, or wanting to appear disabled, for fraudulent reasons. 6. Only pain levels of 5 or below will be seen at our facility. 7. Pain levels of 6 and above will be sent to the Emergency Department and the appointment cancelled. ______________________________________________________________________________________________   ____________________________________________________________________________________________  Preparing for Procedure with Sedation  Instructions: . Oral Intake: Do not eat or drink anything for at least 8 hours prior to your procedure. . Transportation: Public transportation is not allowed. Bring an adult driver. The driver must be physically present in our waiting room before any procedure can be started. Marland Kitchen. Physical Assistance: Bring an adult physically capable of assisting you, in the event you need help. This adult should keep you company at home for at least 6 hours after the procedure. . Blood Pressure Medicine: Take your blood pressure medicine with a sip of water the morning of the procedure. . Blood thinners: Notify our staff if you are taking any blood thinners. Depending on which one you take, there will be specific instructions on how and when to stop it. . Diabetics on insulin: Notify the staff so that you can be scheduled 1st case in the morning. If your diabetes requires high dose insulin, take only  of your normal insulin dose the morning of the procedure and notify the staff that you have done so. . Preventing infections: Shower with an antibacterial soap the morning of your procedure. . Build-up your immune system: Take 1000 mg of Vitamin C with every meal (3 times a day) the day prior to your procedure. Marland Kitchen. Antibiotics: Inform the staff if you have a condition or reason that requires you to take antibiotics before dental procedures. . Pregnancy: If you are pregnant, call and  cancel the procedure. . Sickness: If you have a cold, fever, or any active infections, call and cancel the procedure. . Arrival: You must be in the facility at least 30 minutes prior to your scheduled procedure. . Children: Do not bring children with you. . Dress appropriately: Bring dark clothing that you would not mind if they get stained. . Valuables: Do not bring any jewelry or valuables.  Procedure appointments are reserved for interventional treatments only. Marland Kitchen. No Prescription Refills. . No medication changes will be discussed during procedure appointments. . No disability issues will be discussed.  Reasons to call and reschedule or cancel your procedure: (Following these recommendations will minimize the risk of a serious complication.) . Surgeries: Avoid having procedures within 2 weeks of any surgery. (Avoid for 2 weeks before or after any surgery). . Flu Shots: Avoid having procedures within 2 weeks of a flu shots or . (Avoid for 2 weeks before or after immunizations). . Barium: Avoid having a procedure within 7-10 days after having had a radiological study involving the use of radiological contrast. (Myelograms, Barium swallow or enema study). . Heart attacks: Avoid any elective procedures or surgeries for the initial 6 months after a "Myocardial Infarction" (Heart Attack). . Blood thinners: It is imperative that you stop these medications before  procedures. Let us know if you if you take any blood thinner.  . Infection: Avoid procedures during or within two weeks of an infection (including chest colds or gastrointestinal problems). Symptoms associated with infections include: Localized redness, fever, chills, night sweats or profuse sweating, burning sensation when voiding, cough, congestion, stuffiness, runny nose, sore throat, diarrhea, nausea, vomiting, cold or Flu symptoms, recent or current infections. It is specially important if the infection is over the area that we intend to  treat. Marland Kitchen Heart and lung problems: Symptoms that may suggest an active cardiopulmonary problem include: cough, chest pain, breathing difficulties or shortness of breath, dizziness, ankle swelling, uncontrolled high or unusually low blood pressure, and/or palpitations. If you are experiencing any of these symptoms, cancel your procedure and contact your primary care physician for an evaluation.  Remember:  Regular Business hours are:  Monday to Thursday 8:00 AM to 4:00 PM  Provider's Schedule: Delano Metz, MD:  Procedure days: Tuesday and Thursday 7:30 AM to 4:00 PM  Edward Jolly, MD:  Procedure days: Monday and Wednesday 7:30 AM to 4:00 PM ____________________________________________________________________________________________  Radiofrequency Lesioning Radiofrequency lesioning is a procedure that is performed to relieve pain. The procedure is often used for back, neck, or arm pain. Radiofrequency lesioning involves the use of a machine that creates radio waves to make heat. During the procedure, the heat is applied to the nerve that carries the pain signal. The heat damages the nerve and interferes with the pain signal. Pain relief usually starts about 2 weeks after the procedure and lasts for 6 months to 1 year. Tell a health care provider about:  Any allergies you have.  All medicines you are taking, including vitamins, herbs, eye drops, creams, and over-the-counter medicines.  Any problems you or family members have had with anesthetic medicines.  Any blood disorders you have.  Any surgeries you have had.  Any medical conditions you have.  Whether you are pregnant or may be pregnant. What are the risks? Generally, this is a safe procedure. However, problems may occur, including:  Pain or soreness at the injection site.  Infection at the injection site.  Damage to nerves or blood vessels. What happens before the procedure?  Ask your health care provider  about: ? Changing or stopping your regular medicines. This is especially important if you are taking diabetes medicines or blood thinners. ? Taking medicines such as aspirin and ibuprofen. These medicines can thin your blood. Do not take these medicines before your procedure if your health care provider instructs you not to.  Follow instructions from your health care provider about eating or drinking restrictions.  Plan to have someone take you home after the procedure.  If you go home right after the procedure, plan to have someone with you for 24 hours. What happens during the procedure?  You will be given one or more of the following: ? A medicine to help you relax (sedative). ? A medicine to numb the area (local anesthetic).  You will be awake during the procedure. You will need to be able to talk with the health care provider during the procedure.  With the help of a type of X-ray (fluoroscopy), the health care provider will insert a radiofrequency needle into the area to be treated.  Next, a wire that carries the radio waves (electrode) will be put through the radiofrequency needle. An electrical pulse will be sent through the electrode to verify the correct nerve. You will feel a tingling sensation, and you  may have muscle twitching.  Then, the tissue that is around the needle tip will be heated by an electric current that is passed using the radiofrequency machine. This will numb the nerves.  A bandage (dressing) will be put on the insertion area after the procedure is done. The procedure may vary among health care providers and hospitals. What happens after the procedure?  Your blood pressure, heart rate, breathing rate, and blood oxygen level will be monitored often until the medicines you were given have worn off.  Return to your normal activities as directed by your health care provider. This information is not intended to replace advice given to you by your health care  provider. Make sure you discuss any questions you have with your health care provider. Document Released: 03/03/2011 Document Revised: 12/11/2015 Document Reviewed: 08/12/2014 Elsevier Interactive Patient Education  2019 [employer].

## 2018-08-29 NOTE — Progress Notes (Signed)
Patient's Name: Erica [name]  MRN: [id number]  Referring Provider: Denton Lank, MD  DOB: 1966/05/20  PCP: Denton Lank, MD  DOS: 08/29/2018  Note by: Dionisio David, NP  Service setting: Ambulatory outpatient  Specialty: Interventional Pain Management  Location: [facility] (AMB) Pain Management Facility    Patient type: Established   HPI  Reason for Visit: Erica [name] is a 53 y.o. year old, female patient, who comes today with a chief complaint of Back Pain (lower); Knee Pain (bilateral); and Hip Pain (left) Last Appointment: She was last seen by me on 06/13/2018. Pain Assessment: Today, Erica [name] describes the severity of the Chronic pain as a 5 /10. She indicates the location/referral of the pain to be Back Lower/left hip down back of leg. Onset was: More than a month ago. The quality of pain is described as Aching, Nagging, Discomfort, Numbness. Temporal description, or timing of pain is: Constant. Possible modifying factors: medications rest. Erica [name] describes the pain effects on ADL as: "limit the things I can do".  Erica [name]  height is 5' 8"  (1.727 m) and weight is 193 lb (87.5 kg). Her temperature is 98.6 F (37 C). Her blood pressure is 137/78 and her pulse is 109 (abnormal). Her respiration is 18 and oxygen saturation is 98%.  She would like to have a RFA repeated on her left side. She has pain that goes into her hip and above the knee. She has occasional numbness or tingling in her leg into her foot. She denies any pain in the legs on the right.  She admits that she was told that her left knee needs to be replaced. She will have this evaluated at [facility] in May. She was recently in the hospital for pancreatitis. She will follow up with GI at [facility].   Controlled Substance Pharmacotherapy Assessment REMS (Risk Evaluation and Mitigation Strategy)  Analgesic:Oxycodone IR 5 mg 1 tablet by mouth every 8 hours (51m/dayof oxycodone) MME/day:22.576mday.  GaIgnatius SpeckingRN   08/29/2018 11:16 AM  Sign when Signing Visit Nursing Pain Medication Assessment:  Safety precautions to be maintained throughout the outpatient stay will include: orient to surroundings, keep bed in low position, maintain call bell within reach at all times, provide assistance with transfer out of bed and ambulation.  Medication Inspection Compliance: Pill count conducted under aseptic conditions, in front of the patient. Neither the pills nor the bottle was removed from the patient's sight at any time. Once count was completed pills were immediately returned to the patient in their original bottle.  Medication #1: Oxycodone IR Pill/Patch Count: 0 of 90 pills remain Pill/Patch Appearance: Markings consistent with prescribed medication Bottle Appearance: Standard pharmacy container. Clearly labeled. Filled Date: 1237 29 / 2019 Last Medication intake:  Ran out of medicine more than 48 hours ago  Medication #2: Oxycodone IR Pill/Patch Count: 0 of 18 pills remain Pill/Patch Appearance: Markings consistent with prescribed medication Bottle Appearance: Standard pharmacy container. Clearly labeled. Filled Date: 1 / 283 2020 Last Medication intake:  Ran out of medicine more than 48 hours ago   Patient received mediciine from hospital stay   Pharmacokinetics: Liberation and absorption (onset of action): WNL Distribution (time to peak effect): WNL Metabolism and excretion (duration of action): WNL         Pharmacodynamics: Desired effects: Analgesia: Erica [name] >50% benefit. Functional ability: Patient reports that medication allows her to accomplish basic ADLs Clinically meaningful improvement in function (CMIF): Sustained CMIF goals  met Perceived effectiveness: Described as relatively effective, allowing for increase in activities of daily living (ADL) Undesirable effects: Side-effects or Adverse reactions: None reported Monitoring: [place] PMP: Online review of the past 59-monthperiod  conducted. Abnormal. Results discussed with patient discharge medication Last UDS on record: Summary  Date Value Ref Range Status  05/16/2018 FINAL  Final    Comment:    ==================================================================== TOXASSURE SELECT 13 (MW) ==================================================================== Test                             Result       Flag       Units Drug Present not Declared for Prescription Verification   Oxazepam                       20           UNEXPECTED ng/mg creat    Oxazepam may be administered as a scheduled prescription    medication; it is also an expected metabolite of other    benzodiazepine drugs, including diazepam, chlordiazepoxide,    prazepam, clorazepate, halazepam, and temazepam. Drug Absent but Declared for Prescription Verification   Oxycodone                      Not Detected UNEXPECTED ng/mg creat ==================================================================== Test                      Result    Flag   Units      Ref Range   Creatinine              152              mg/dL      >=20 ==================================================================== Declared Medications:  The flagging and interpretation on this report are based on the  following declared medications.  Unexpected results may arise from  inaccuracies in the declared medications.  **Note: The testing scope of this panel includes these medications:  Oxycodone  **Note: The testing scope of this panel does not include following  reported medications:  Albuterol  Aspirin (Aspirin 81)  Atorvastatin  Cholecalciferol  Diltiazem  Duloxetine  Fluconazole  Fluticasone  Folic acid  Furosemide  Losartan (Losartan Potassium)  Magnesium Oxide  Metformin  Oxygen  Pantoprazole  Potassium  Pregabalin  Sucralfate  Tiotropium  Tizanidine ==================================================================== For clinical consultation, please call [phone]  [phone] ====================================================================    UDS interpretation: Non-Compliant Patient reminded of the CDC guidelines recommending to stay away from the sedatives & benzodiazepines due to the risk of respiratory depression and death. Medication Assessment Form: Reviewed. Patient indicates being compliant with therapy Treatment compliance: Compliant Risk Assessment Profile: Aberrant behavior: failure to bring medications for pill counts and frequent emergency department visits for pain Comorbid factors increasing risk of overdose: age [id number] old, caucasian, liver disease and nicotine dependence Opioid risk tool (ORT):  Opioid Risk  08/29/2018  Alcohol 0  Illegal Drugs 0  Rx Drugs 0  Alcohol 0  Illegal Drugs 0  Rx Drugs 0  Age between 16-45 years  0  History of Preadolescent Sexual Abuse 0  Psychological Disease 2  ADD Negative  OCD Negative  Bipolar Negative  Depression 1  Opioid Risk Tool Scoring 3  Opioid Risk Interpretation Low Risk    ORT Scoring interpretation table:  Score <3 = Low Risk for SUD  Score between 4-7 = Moderate Risk  for SUD  Score >8 = High Risk for Opioid Abuse   Risk of substance use disorder (SUD): Moderate-to-High  Risk Mitigation Strategies:  Patient Counseling: Covered Patient-Prescriber Agreement (PPA): Present and active  Notification to other healthcare providers: Done  Pharmacologic Plan: No change in therapy, at this time.             ROS  Constitutional: Denies any fever or chills Gastrointestinal: No reported hemesis, hematochezia, vomiting, or acute GI distress Musculoskeletal: Denies any acute onset joint swelling, redness, loss of ROM, or weakness Neurological: No reported episodes of acute onset apraxia, aphasia, dysarthria, agnosia, amnesia, paralysis, loss of coordination, or loss of consciousness  Medication Review  ACCU-CHEK AVIVA PLUS, DULoxetine, Fluticasone-Salmeterol, Magnesium Oxide  (Antacid), Oxygen-Helium, Vitamin D3, albuterol, aspirin, atorvastatin, diltiazem, fluconazole, folic acid, furosemide, metFORMIN, oxyCODONE, pantoprazole, potassium chloride SA, pregabalin, sucralfate, tiZANidine, and tiotropium  History Review  Allergy: Erica [name] is allergic to tramadol and penicillins. Drug: Erica [name]  reports no history of drug use. Alcohol:  reports no history of alcohol use. Tobacco:  reports that she quit smoking about 3 years ago. Her smoking use included cigarettes. She has a 36.00 pack-year smoking history. She has never used smokeless tobacco. Social: Erica [name]  reports that she quit smoking about 3 years ago. Her smoking use included cigarettes. She has a 36.00 pack-year smoking history. She has never used smokeless tobacco. She reports that she does not drink alcohol or use drugs. Medical:  has a past medical history of (HFpEF) heart failure with preserved ejection fraction ([place]), Arthritis, Asthma, Bell's palsy, Bronchitis, CHF (congestive heart failure) ([place]), COPD (chronic obstructive pulmonary disease) ([place]), Diabetes mellitus without complication ([place]), Gastric ulcer, Hyperlipidemia, Hypertension, OSA (obstructive sleep apnea), Pancreatitis, and Shoulder injury. Surgical: Erica [name]  has a past surgical history that includes Knee surgery (Left) and Flexible bronchoscopy (N/A, 06/20/2015). Family: family history includes Breast cancer in her mother; Diabetes in her maternal grandmother; Lung cancer in her father. Problem List: Erica [name] has Chronic low back pain (Primary Source of Pain) (Bilateral) (L>R); Chronic knee pain [facility] source of pain) (Bilateral) (L>R); Chronic neck pain (Bilateral) (R>L); Chronic upper back pain (midline); Chronic foot pain (bottom of feet) (Bilateral) (R>L); Chronic hand pain (Bilateral); Peripheral neuropathy, idiopathic (upper and lower extremity); Chronic sacroiliac joint pain (Bilateral) (R>L); Lumbar facet syndrome (Bilateral)  (R>L); Chronic pain disorder; Neurogenic pain; Lumbar spondylosis; Osteoarthritis of knee (Bilateral) (L>R); Intermittent left thoracic Muscle cramps; Spasm of thoracolumbar muscle (Left); and Post-traumatic osteoarthritis of knee (Left) on their pertinent problem list.  Lab Review  Kidney Function Lab Results  Component Value Date   BUN 10 08/12/2018   CREATININE 0.70 08/12/2018   BCR 15 04/22/2016   GFRAA >60 08/12/2018   GFRNONAA >60 08/12/2018  Liver Function Lab Results  Component Value Date   AST 189 (H) 08/12/2018   ALT 985 (H) 08/12/2018   ALBUMIN 3.3 (L) 08/12/2018  Note: Above Lab results reviewed.  Imaging Review  US ABDOMEN LIMITED RUQ CLINICAL DATA:  Transaminitis.  EXAM: ULTRASOUND ABDOMEN LIMITED RIGHT UPPER QUADRANT  COMPARISON:  None.  FINDINGS: Gallbladder:  No gallstones or wall thickening visualized. Gallbladder sludge is present. No sonographic Murphy sign noted by sonographer.  Common bile duct:  Diameter: 4.2 mm  Liver:  No focal lesion identified. Mildly increased parenchymal echogenicity. Portal vein is patent on color Doppler imaging with normal direction of blood flow towards the liver.  IMPRESSION: Gallbladder sludge without evidence of acute cholecystitis.  Mildly increased parenchymal  echogenicity of the liver, usually associated with hepatic steatosis or fibrosis.  Electronically Signed   By: Fidela Salisbury M.D.   On: 08/12/2018 22:10 Note: Above imaging results reviewed.        Physical Exam  General appearance: Well nourished, well developed, and well hydrated. In no apparent acute distress Mental status: Alert, oriented x 3 (person, place, & time)       Respiratory: No evidence of acute respiratory distress Eyes: PERLA Vitals: BP 137/78   Pulse (!) 109   Temp 98.6 F (37 C)   Resp 18   Ht 5' 8"  (1.727 m)   Wt 193 lb (87.5 kg)   LMP 03/13/2015 (Approximate) Comment: more than 2 years ago  SpO2 98%   BMI 29.35  kg/m  BMI: Estimated body mass index is 29.35 kg/m as calculated from the following:   Height as of this encounter: 5' 8"  (1.727 m).   Weight as of this encounter: 193 lb (87.5 kg). Ideal: Ideal body weight: 63.9 kg (140 lb 14 oz) Adjusted ideal body weight: 73.4 kg (161 lb 11.6 oz) Lumbar Spine Area Exam  Skin & Axial Inspection: No masses, redness, or swelling Alignment: Symmetrical Functional ROM: Unrestricted ROM       Stability: No instability detected Muscle Tone/Strength: Functionally intact. No obvious neuro-muscular anomalies detected. Sensory (Neurological): Unimpaired Palpation: No palpable anomalies       Provocative Tests: Hyperextension/rotation test: Positive bilaterally for facet joint pain. Lumbar quadrant test (Kemp's test): deferred today       Lateral bending test: deferred today       Patrick's Maneuver: deferred today                    Lower Extremity Exam    Side: Right lower extremity  Side: Left lower extremity  Stability: No instability observed          Stability: No instability observed          Skin & Extremity Inspection: Skin color, temperature, and hair growth are WNL. No peripheral edema or cyanosis. No masses, redness, swelling, asymmetry, or associated skin lesions. No contractures.  Skin & Extremity Inspection: Skin color, temperature, and hair growth are WNL. No peripheral edema or cyanosis. No masses, redness, swelling, asymmetry, or associated skin lesions. No contractures.  Functional ROM: Unrestricted ROM                  Functional ROM: Unrestricted ROM                  Muscle Tone/Strength: Functionally intact. No obvious neuro-muscular anomalies detected.  Muscle Tone/Strength: Functionally intact. No obvious neuro-muscular anomalies detected.  Sensory (Neurological): Unimpaired        Sensory (Neurological): Referred pain pattern            Palpation: No palpable anomalies  Palpation: No palpable anomalies   Assessment   Status  Diagnosis  Persistent Controlled Persistent 1. Lumbar spondylosis   2. Peripheral neuropathy, idiopathic (upper and lower extremity)   3. Chronic pain syndrome   4. Neurogenic pain   5. Long term current use of opiate analgesic      Updated Problems: Problem  Chronic Pain Disorder  Tobacco Use Disorder  Acute Pancreatitis  Elevated Lfts  Peptic Ulcer Disease  Copd (Chronic Obstructive Pulmonary Disease) ([facility])    Plan of Care  Medications: I have discontinued Tanaiya A. [name] losartan. I have also changed her oxyCODONE, pregabalin, and tiZANidine. Additionally, I  am having her maintain her aspirin, sucralfate, tiotropium, folic acid, albuterol, DULoxetine, potassium chloride SA, OXYGEN, Magnesium Oxide (Antacid), fluconazole, ACCU-CHEK AVIVA PLUS, Vitamin D3, atorvastatin, diltiazem, DULoxetine, metFORMIN, furosemide, pantoprazole, Fluticasone-Salmeterol, metFORMIN, and diltiazem.  Administered today: Charae Depaolis. Mcmasters had no medications administered during this visit.  Orders:  Orders Placed This Encounter  Procedures  . Radiofrequency,Lumbar    Standing Status:   Future    Standing Expiration Date:   02/27/2020    Scheduling Instructions:     Side(s): Left-sided     Level(s): L2, L3, L4, L5, & S1 Medial Branch Nerve(s)     Sedation: With Sedation     Scheduling Timeframe: 2 weeks from now    Order Specific Question:   Where will this procedure be performed?    Answer:   [facility]  . ToxASSURE Select 13 (MW), Urine    Volume: 30 ml(s). Minimum 3 ml of urine is needed. Document temperature of fresh sample. Indications: Long term (current) use of opiate analgesic (S06.301)   Interventional options: Planned follow-up:   Palliative left lumbar facet radiofrequency ablation Return in about 4 weeks (around 09/26/2018) for MedMgmt, , and, w/ Dr. Dossie Arbour, Procedure(w/Sedation), (L) L Fct RFA.   Considering:  Diagnostic bilateral lumbar facet block #3 Possible  bilateral lumbar facet radiofrequency ablation  Possible bilateral diagnostic sacroiliac joint block  Possible bilateral intra-articular hip joint injection  Possible bilateral intra-articular knee joint injection Possible bilateral genicular nerve block  Possible bilateral knee joint radiofrequency ablation.  Possible left-sided lumbar epidural steroid injection Possible right-sided cervical epidural steroid injection Possible bilateral diagnostic cervical facet block Possible bilateral diagnostic greater occipital nerve blocks Possible greater occipital nerve radiofrequency ablation.   Palliative PRN treatment(s):  Palliative bilateral lumbar facet radiofrequency ablation    Note by: Dionisio David, NP Date: 08/29/2018; Time: 2:40 PM

## 2018-08-29 NOTE — Progress Notes (Signed)
Nursing Pain Medication Assessment:  Safety precautions to be maintained throughout the outpatient stay will include: orient to surroundings, keep bed in low position, maintain call bell within reach at all times, provide assistance with transfer out of bed and ambulation.  Medication Inspection Compliance: Pill count conducted under aseptic conditions, in front of the patient. Neither the pills nor the bottle was removed from the patient's sight at any time. Once count was completed pills were immediately returned to the patient in their original bottle.  Medication #1: Oxycodone IR Pill/Patch Count: 0 of 90 pills remain Pill/Patch Appearance: Markings consistent with prescribed medication Bottle Appearance: Standard pharmacy container. Clearly labeled. Filled Date: 76 / 29 / 2019 Last Medication intake:  Ran out of medicine more than 48 hours ago  Medication #2: Oxycodone IR Pill/Patch Count: 0 of 18 pills remain Pill/Patch Appearance: Markings consistent with prescribed medication Bottle Appearance: Standard pharmacy container. Clearly labeled. Filled Date: 1 / 27 / 2020 Last Medication intake:  Ran out of medicine more than 48 hours ago   Patient received mediciine from hospital stay

## 2018-09-03 LAB — TOXASSURE SELECT 13 (MW), URINE

## 2018-09-04 ENCOUNTER — Telehealth: Payer: Self-pay | Admitting: *Deleted

## 2018-09-04 ENCOUNTER — Other Ambulatory Visit: Payer: Self-pay | Admitting: Nurse Practitioner

## 2018-09-04 ENCOUNTER — Telehealth: Payer: Self-pay

## 2018-09-04 NOTE — Telephone Encounter (Signed)
Patient called stating pharmacy filled script for 30 pills. And will not fill the 90 days script without authorization from C. Brooke Dare

## 2018-09-04 NOTE — Telephone Encounter (Signed)
Pharmacy was called, Tizandine was ordered. Pt was called and was informed that new order for Tizandine was sent to pharmacy.

## 2018-09-05 NOTE — Telephone Encounter (Signed)
No additional notes are needed for this encounter.

## 2018-09-08 ENCOUNTER — Telehealth: Payer: Self-pay | Admitting: Nurse Practitioner

## 2018-09-08 ENCOUNTER — Other Ambulatory Visit: Payer: Self-pay | Admitting: Nurse Practitioner

## 2018-09-10 ENCOUNTER — Encounter: Payer: Self-pay | Admitting: Emergency Medicine

## 2018-09-10 ENCOUNTER — Inpatient Hospital Stay
Admission: EM | Admit: 2018-09-10 | Discharge: 2018-09-12 | DRG: 641 | Disposition: A | Discharge status: Home / Self Care | Payer: Medicaid Other | Attending: Internal Medicine | Admitting: Internal Medicine

## 2018-09-10 ENCOUNTER — Emergency Department: Payer: Medicaid Other

## 2018-09-10 ENCOUNTER — Other Ambulatory Visit: Payer: Self-pay

## 2018-09-10 DIAGNOSIS — R079 Chest pain, unspecified: Secondary | ICD-10-CM

## 2018-09-10 DIAGNOSIS — G8929 Other chronic pain: Secondary | ICD-10-CM | POA: Diagnosis present

## 2018-09-10 DIAGNOSIS — E119 Type 2 diabetes mellitus without complications: Secondary | ICD-10-CM | POA: Diagnosis present

## 2018-09-10 DIAGNOSIS — Z833 Family history of diabetes mellitus: Secondary | ICD-10-CM | POA: Diagnosis not present

## 2018-09-10 DIAGNOSIS — Z7951 Long term (current) use of inhaled steroids: Secondary | ICD-10-CM

## 2018-09-10 DIAGNOSIS — D649 Anemia, unspecified: Secondary | ICD-10-CM | POA: Diagnosis present

## 2018-09-10 DIAGNOSIS — Z79891 Long term (current) use of opiate analgesic: Secondary | ICD-10-CM

## 2018-09-10 DIAGNOSIS — I5032 Chronic diastolic (congestive) heart failure: Secondary | ICD-10-CM | POA: Diagnosis present

## 2018-09-10 DIAGNOSIS — Z885 Allergy status to narcotic agent status: Secondary | ICD-10-CM

## 2018-09-10 DIAGNOSIS — Z9981 Dependence on supplemental oxygen: Secondary | ICD-10-CM

## 2018-09-10 DIAGNOSIS — R9431 Abnormal electrocardiogram [ECG] [EKG]: Secondary | ICD-10-CM

## 2018-09-10 DIAGNOSIS — Z801 Family history of malignant neoplasm of trachea, bronchus and lung: Secondary | ICD-10-CM

## 2018-09-10 DIAGNOSIS — Z880 Allergy status to penicillin: Secondary | ICD-10-CM

## 2018-09-10 DIAGNOSIS — G4733 Obstructive sleep apnea (adult) (pediatric): Secondary | ICD-10-CM | POA: Diagnosis present

## 2018-09-10 DIAGNOSIS — M1990 Unspecified osteoarthritis, unspecified site: Secondary | ICD-10-CM | POA: Diagnosis present

## 2018-09-10 DIAGNOSIS — E876 Hypokalemia: Principal | ICD-10-CM | POA: Diagnosis present

## 2018-09-10 DIAGNOSIS — K2920 Alcoholic gastritis without bleeding: Secondary | ICD-10-CM

## 2018-09-10 DIAGNOSIS — Z7982 Long term (current) use of aspirin: Secondary | ICD-10-CM

## 2018-09-10 DIAGNOSIS — I110 Hypertensive heart disease with heart failure: Secondary | ICD-10-CM | POA: Diagnosis present

## 2018-09-10 DIAGNOSIS — F329 Major depressive disorder, single episode, unspecified: Secondary | ICD-10-CM | POA: Diagnosis present

## 2018-09-10 DIAGNOSIS — Z803 Family history of malignant neoplasm of breast: Secondary | ICD-10-CM | POA: Diagnosis not present

## 2018-09-10 DIAGNOSIS — J449 Chronic obstructive pulmonary disease, unspecified: Secondary | ICD-10-CM | POA: Diagnosis present

## 2018-09-10 DIAGNOSIS — Z79899 Other long term (current) drug therapy: Secondary | ICD-10-CM | POA: Diagnosis not present

## 2018-09-10 DIAGNOSIS — R0789 Other chest pain: Secondary | ICD-10-CM | POA: Diagnosis present

## 2018-09-10 DIAGNOSIS — E872 Acidosis: Secondary | ICD-10-CM

## 2018-09-10 DIAGNOSIS — F10188 Alcohol abuse with other alcohol-induced disorder: Secondary | ICD-10-CM | POA: Diagnosis present

## 2018-09-10 DIAGNOSIS — Z7984 Long term (current) use of oral hypoglycemic drugs: Secondary | ICD-10-CM

## 2018-09-10 DIAGNOSIS — E785 Hyperlipidemia, unspecified: Secondary | ICD-10-CM | POA: Diagnosis present

## 2018-09-10 DIAGNOSIS — Z87891 Personal history of nicotine dependence: Secondary | ICD-10-CM

## 2018-09-10 DIAGNOSIS — Z8711 Personal history of peptic ulcer disease: Secondary | ICD-10-CM | POA: Diagnosis not present

## 2018-09-10 DIAGNOSIS — G629 Polyneuropathy, unspecified: Secondary | ICD-10-CM | POA: Diagnosis present

## 2018-09-10 DIAGNOSIS — K219 Gastro-esophageal reflux disease without esophagitis: Secondary | ICD-10-CM | POA: Diagnosis present

## 2018-09-10 DIAGNOSIS — E8729 Other acidosis: Secondary | ICD-10-CM

## 2018-09-10 LAB — CBC WITH DIFFERENTIAL/PLATELET
Abs Immature Granulocytes: 0.04 10*3/uL (ref 0.00–0.07)
Basophils Absolute: 0.2 10*3/uL — ABNORMAL HIGH (ref 0.0–0.1)
Basophils Relative: 2 %
EOS ABS: 0.3 10*3/uL (ref 0.0–0.5)
Eosinophils Relative: 3 %
HCT: 34.6 % — ABNORMAL LOW (ref 36.0–46.0)
Hemoglobin: 11.2 g/dL — ABNORMAL LOW (ref 12.0–15.0)
Immature Granulocytes: 0 %
Lymphocytes Relative: 22 %
Lymphs Abs: 2.3 10*3/uL (ref 0.7–4.0)
MCH: 30.8 pg (ref 26.0–34.0)
MCHC: 32.4 g/dL (ref 30.0–36.0)
MCV: 95.1 fL (ref 80.0–100.0)
Monocytes Absolute: 1.6 10*3/uL — ABNORMAL HIGH (ref 0.1–1.0)
Monocytes Relative: 16 %
Neutro Abs: 6.1 10*3/uL (ref 1.7–7.7)
Neutrophils Relative %: 57 %
Platelets: 171 10*3/uL (ref 150–400)
RBC: 3.64 MIL/uL — ABNORMAL LOW (ref 3.87–5.11)
RDW: 22.9 % — ABNORMAL HIGH (ref 11.5–15.5)
WBC: 10.5 10*3/uL (ref 4.0–10.5)
nRBC: 0 % (ref 0.0–0.2)

## 2018-09-10 LAB — TROPONIN I
Troponin I: <0.03 ng/mL (ref <0.03)
Troponin I: <0.03 ng/mL (ref <0.03)
Troponin I: <0.03 ng/mL (ref <0.03)

## 2018-09-10 LAB — BASIC METABOLIC PANEL
ANION GAP: 15 (ref 5–15)
BUN: <5 mg/dL — ABNORMAL LOW (ref 6–20)
CO2: 27 mmol/L (ref 22–32)
Calcium: 8 mg/dL — ABNORMAL LOW (ref 8.9–10.3)
Chloride: 94 mmol/L — ABNORMAL LOW (ref 98–111)
Creatinine, Ser: 0.59 mg/dL (ref 0.44–1.00)
GFR calc Af Amer: >60 mL/min (ref >60)
GFR calc non Af Amer: >60 mL/min (ref >60)
GLUCOSE: 146 mg/dL — AB (ref 70–99)
Potassium: 2.2 mmol/L — CL (ref 3.5–5.1)
Sodium: 136 mmol/L (ref 135–145)

## 2018-09-10 LAB — COMPREHENSIVE METABOLIC PANEL
ALT: 32 U/L (ref 0–44)
AST: 35 U/L (ref 15–41)
Albumin: 3.5 g/dL (ref 3.5–5.0)
Alkaline Phosphatase: 121 U/L (ref 38–126)
Anion gap: 17 — ABNORMAL HIGH (ref 5–15)
BUN: <5 mg/dL — ABNORMAL LOW (ref 6–20)
CALCIUM: 8.9 mg/dL (ref 8.9–10.3)
CO2: 28 mmol/L (ref 22–32)
Chloride: 90 mmol/L — ABNORMAL LOW (ref 98–111)
Creatinine, Ser: 0.62 mg/dL (ref 0.44–1.00)
GFR calc Af Amer: >60 mL/min (ref >60)
GFR calc non Af Amer: >60 mL/min (ref >60)
Glucose, Bld: 190 mg/dL — ABNORMAL HIGH (ref 70–99)
Potassium: 2.3 mmol/L — CL (ref 3.5–5.1)
Sodium: 135 mmol/L (ref 135–145)
Total Bilirubin: 0.8 mg/dL (ref 0.3–1.2)
Total Protein: 7 g/dL (ref 6.5–8.1)

## 2018-09-10 LAB — LIPASE, BLOOD: Lipase: 38 U/L (ref 11–51)

## 2018-09-10 LAB — MAGNESIUM: Magnesium: 1.3 mg/dL — ABNORMAL LOW (ref 1.7–2.4)

## 2018-09-10 LAB — GLUCOSE, CAPILLARY: Glucose-Capillary: 216 mg/dL — ABNORMAL HIGH (ref 70–99)

## 2018-09-10 LAB — ETHANOL: Alcohol, Ethyl (B): 160 mg/dL — ABNORMAL HIGH (ref <10)

## 2018-09-10 MED ORDER — MAGNESIUM SULFATE 4 GM/100ML IV SOLN
4.0000 g | Freq: Once | INTRAVENOUS | Status: AC
  Administered 2018-09-10: 4 g via INTRAVENOUS
  Filled 2018-09-10: qty 100

## 2018-09-10 MED ORDER — ALUM & MAG HYDROXIDE-SIMETH 200-200-20 MG/5ML PO SUSP
30.0000 mL | Freq: Once | ORAL | Status: AC
  Administered 2018-09-10: 30 mL via ORAL
  Filled 2018-09-10: qty 30

## 2018-09-10 MED ORDER — POTASSIUM CHLORIDE CRYS ER 20 MEQ PO TBCR
40.0000 meq | EXTENDED_RELEASE_TABLET | Freq: Two times a day (BID) | ORAL | Status: AC
  Administered 2018-09-10 – 2018-09-11 (×3): 40 meq via ORAL
  Filled 2018-09-10 (×3): qty 2

## 2018-09-10 MED ORDER — PREGABALIN 75 MG PO CAPS
75.0000 mg | ORAL_CAPSULE | Freq: Three times a day (TID) | ORAL | Status: DC
  Administered 2018-09-10 – 2018-09-12 (×5): 75 mg via ORAL
  Filled 2018-09-10 (×5): qty 1

## 2018-09-10 MED ORDER — TIOTROPIUM BROMIDE MONOHYDRATE 18 MCG IN CAPS
18.0000 ug | ORAL_CAPSULE | Freq: Every day | RESPIRATORY_TRACT | Status: DC
  Administered 2018-09-11 – 2018-09-12 (×2): 18 ug via RESPIRATORY_TRACT
  Filled 2018-09-10: qty 5

## 2018-09-10 MED ORDER — POTASSIUM CHLORIDE 10 MEQ/100ML IV SOLN
10.0000 meq | Freq: Once | INTRAVENOUS | Status: AC
  Administered 2018-09-10: 10 meq via INTRAVENOUS
  Filled 2018-09-10: qty 100

## 2018-09-10 MED ORDER — ATORVASTATIN CALCIUM 20 MG PO TABS
80.0000 mg | ORAL_TABLET | Freq: Every day | ORAL | Status: DC
  Administered 2018-09-11: 80 mg via ORAL
  Filled 2018-09-10 (×2): qty 4

## 2018-09-10 MED ORDER — ASPIRIN EC 81 MG PO TBEC
81.0000 mg | DELAYED_RELEASE_TABLET | Freq: Every day | ORAL | Status: DC
  Administered 2018-09-10 – 2018-09-12 (×3): 81 mg via ORAL
  Filled 2018-09-10 (×3): qty 1

## 2018-09-10 MED ORDER — DULOXETINE HCL 30 MG PO CPEP
30.0000 mg | ORAL_CAPSULE | Freq: Every day | ORAL | Status: DC
  Administered 2018-09-11 – 2018-09-12 (×2): 30 mg via ORAL
  Filled 2018-09-10 (×2): qty 1

## 2018-09-10 MED ORDER — LIDOCAINE VISCOUS HCL 2 % MT SOLN
15.0000 mL | Freq: Once | OROMUCOSAL | Status: AC
  Administered 2018-09-10: 15 mL via ORAL
  Filled 2018-09-10: qty 15

## 2018-09-10 MED ORDER — FAMOTIDINE 40 MG PO TABS: 40.0000 mg | ORAL_TABLET | Freq: Every evening | ORAL | 0 refills | Status: DC

## 2018-09-10 MED ORDER — DULOXETINE HCL 30 MG PO CPEP
60.0000 mg | ORAL_CAPSULE | Freq: Every day | ORAL | Status: DC
  Administered 2018-09-11 – 2018-09-12 (×2): 60 mg via ORAL
  Filled 2018-09-10 (×2): qty 2

## 2018-09-10 MED ORDER — ACETAMINOPHEN 325 MG PO TABS
650.0000 mg | ORAL_TABLET | Freq: Four times a day (QID) | ORAL | Status: DC | PRN
  Administered 2018-09-11: 650 mg via ORAL
  Filled 2018-09-10: qty 2

## 2018-09-10 MED ORDER — ACETAMINOPHEN 650 MG RE SUPP: 650.0000 mg | Freq: Four times a day (QID) | RECTAL | Status: DC | PRN

## 2018-09-10 MED ORDER — METFORMIN HCL ER 500 MG PO TB24
500.0000 mg | ORAL_TABLET | Freq: Two times a day (BID) | ORAL | Status: DC
  Administered 2018-09-11 – 2018-09-12 (×3): 500 mg via ORAL
  Filled 2018-09-10 (×4): qty 1

## 2018-09-10 MED ORDER — INSULIN ASPART 100 UNIT/ML ~~LOC~~ SOLN
0.0000 [IU] | Freq: Three times a day (TID) | SUBCUTANEOUS | Status: DC
  Administered 2018-09-11: 1 [IU] via SUBCUTANEOUS
  Administered 2018-09-11: 2 [IU] via SUBCUTANEOUS
  Administered 2018-09-12: 1 [IU] via SUBCUTANEOUS
  Administered 2018-09-12: 2 [IU] via SUBCUTANEOUS
  Filled 2018-09-10 (×4): qty 1

## 2018-09-10 MED ORDER — POTASSIUM CHLORIDE 10 MEQ/100ML IV SOLN
10.0000 meq | INTRAVENOUS | Status: AC
  Administered 2018-09-10 (×4): 10 meq via INTRAVENOUS
  Filled 2018-09-10 (×4): qty 100

## 2018-09-10 MED ORDER — INSULIN ASPART 100 UNIT/ML ~~LOC~~ SOLN
0.0000 [IU] | Freq: Every day | SUBCUTANEOUS | Status: DC
  Administered 2018-09-10: 2 [IU] via SUBCUTANEOUS
  Filled 2018-09-10: qty 1

## 2018-09-10 MED ORDER — DILTIAZEM HCL 30 MG PO TABS
30.0000 mg | ORAL_TABLET | Freq: Three times a day (TID) | ORAL | Status: DC | PRN
  Administered 2018-09-11 – 2018-09-12 (×2): 30 mg via ORAL
  Filled 2018-09-10 (×3): qty 1

## 2018-09-10 MED ORDER — OXYCODONE HCL 5 MG PO TABS
5.0000 mg | ORAL_TABLET | Freq: Three times a day (TID) | ORAL | Status: DC | PRN
  Administered 2018-09-10 (×2): 5 mg via ORAL
  Filled 2018-09-10 (×3): qty 1

## 2018-09-10 MED ORDER — FENTANYL CITRATE (PF) 100 MCG/2ML IJ SOLN
50.0000 ug | Freq: Once | INTRAMUSCULAR | Status: AC
  Administered 2018-09-10: 50 ug via INTRAVENOUS
  Filled 2018-09-10: qty 2

## 2018-09-10 MED ORDER — SUCRALFATE 1 G PO TABS
1.0000 g | ORAL_TABLET | Freq: Two times a day (BID) | ORAL | Status: DC
  Administered 2018-09-10 – 2018-09-12 (×4): 1 g via ORAL
  Filled 2018-09-10 (×4): qty 1

## 2018-09-10 MED ORDER — ONDANSETRON HCL 4 MG/2ML IJ SOLN
4.0000 mg | Freq: Once | INTRAMUSCULAR | Status: AC
  Administered 2018-09-10: 4 mg via INTRAVENOUS
  Filled 2018-09-10: qty 2

## 2018-09-10 MED ORDER — POTASSIUM CHLORIDE CRYS ER 20 MEQ PO TBCR
40.0000 meq | EXTENDED_RELEASE_TABLET | Freq: Once | ORAL | Status: AC
  Administered 2018-09-10: 40 meq via ORAL
  Filled 2018-09-10: qty 2

## 2018-09-10 MED ORDER — ASPIRIN 81 MG PO CHEW: 324.0000 mg | CHEWABLE_TABLET | Freq: Once | ORAL | Status: DC

## 2018-09-10 MED ORDER — SODIUM CHLORIDE 0.9 % IV BOLUS
1000.0000 mL | Freq: Once | INTRAVENOUS | Status: AC
  Administered 2018-09-10: 1000 mL via INTRAVENOUS

## 2018-09-10 MED ORDER — ENOXAPARIN SODIUM 40 MG/0.4ML ~~LOC~~ SOLN
40.0000 mg | SUBCUTANEOUS | Status: DC
  Administered 2018-09-10 – 2018-09-11 (×2): 40 mg via SUBCUTANEOUS
  Filled 2018-09-10 (×2): qty 0.4

## 2018-09-10 MED ORDER — SODIUM CHLORIDE 0.9% FLUSH: 3.0000 mL | Freq: Once | INTRAVENOUS | Status: DC

## 2018-09-10 NOTE — Progress Notes (Signed)
Per Dr. Enedina Finner draw last troponin, give 81 mg aspirin, and run an EKG.  Madie Reno, RN

## 2018-09-10 NOTE — ED Notes (Signed)
Cassie, RN called to take pt to the floor

## 2018-09-10 NOTE — Progress Notes (Addendum)
Patient's having mid sternal chest pain 7/10. Patient states feeling pressure and discomfort. Troponin's not elevated. Patient denise SOB. VS stable. Paged on call doctor.   Madie Reno, RN

## 2018-09-10 NOTE — H&P (Signed)
[facility] NAME: Erica [name]    MR#:  [id number]  DATE OF BIRTH:  1965-09-22  DATE OF ADMISSION:  09/10/2018  PRIMARY CARE PHYSICIAN: Denton Lank, MD   REQUESTING/REFERRING PHYSICIAN: Dr. Rudene Re  CHIEF COMPLAINT:   Chief Complaint  Patient presents with  . Chest Pain    HISTORY OF PRESENT ILLNESS:  Erica [name]  is a 53 y.o. female with a known history of alcohol abuse, tobacco abuse, history of pancreatitis, diabetes, COPD, history of congestive heart failure, osteoarthritis who presents to the hospital complaining of some chest pain and incidentally noted to be severely hypokalemic.  Patient says she was in her usual state of health and around 4 or 5 this morning she woke up and having some midsternal chest pain which radiated to the right side of her chest, not associated any shortness of breath diaphoresis palpitations or syncope.  She denies any recent sick contacts, cough congestion or any trauma to the area.  She presents to the ER and was noted to be severely hypokalemic with a potassium of 2.2, she was given aggressive supplementation but her potassium level still remain low.  Hospitalist services were contacted for admission.  She denies any nausea or vomiting or diarrhea or poor p.o. intake.  Patient was recently hospitalized at [facility] about 2 weeks ago for electrolyte abnormalities and also acute pancreatitis.  PAST MEDICAL HISTORY:   Past Medical History:  Diagnosis Date  . (HFpEF) heart failure with preserved ejection fraction ([place])    a. 05/2015 Echo: EF 60-65%, no rwma, PASP 58mHg.  . Arthritis   . Asthma   . Bell's palsy   . Bronchitis   . [place]   . COPD ([place]ronic obstructive pulmonary disease) ([place]   . [place]    type II 03/2017  . Gastric ulcer   . Hyperlipidemia   . Hypertension   . OSA (obstructive sleep apnea)    a. did not  tolerate CPAP.  .Marland KitchenPancreatitis    07/2018  . Shoulder injury    6/19    PAST SURGICAL HISTORY:   Past Surgical History:  Procedure Laterality Date  . FLEXIBLE BRONCHOSCOPY N/A 06/20/2015   Procedure: FLEXIBLE BRONCHOSCOPY;  Surgeon: PLaverle Hobby MD;  Location: [facility] ORS;  Service: Pulmonary;  Laterality: N/A;  . KNEE SURGERY Left    8 knee surgeries    SOCIAL HISTORY:   Social History   Tobacco Use  . Smoking status: Former Smoker    Packs/day: 1.00    Years: 36.00    Pack years: 36.00    Types: Cigarettes    Last attempt to quit: 06/13/2015    Years since quitting: 3.2  . Smokeless tobacco: Never Used  Substance Use Topics  . Alcohol use: No    Alcohol/week: 0.0 standard drinks    Frequency: Never    Comment: occasionaly    FAMILY HISTORY:   Family History  Problem Relation Age of Onset  . Lung cancer Father   . Breast cancer Mother        early 66's . Diabetes Maternal Grandmother     DRUG ALLERGIES:   Allergies  Allergen Reactions  . Tramadol Other (See Comments) and Palpitations    Heart Skipping Beats Heart palpatations  . Penicillins Rash    Has patient had a PCN reaction causing immediate rash, facial/tongue/throat swelling, SOB or lightheadedness with hypotension: Yes Has  patient had a PCN reaction causing severe rash involving mucus membranes or skin necrosis: No Has patient had a PCN reaction that required hospitalization: No Has patient had a PCN reaction occurring within the last 10 years: No If all of the above answers are "NO", then may proceed with Cephalosporin use.    REVIEW OF SYSTEMS:   Review of Systems  Constitutional: Negative for fever and weight loss.  HENT: Negative for congestion, nosebleeds and tinnitus.   Eyes: Negative for blurred vision, double vision and redness.  Respiratory: Negative for cough, hemoptysis and shortness of breath.   Cardiovascular: Positive for chest pain. Negative for orthopnea, leg swelling  and PND.  Gastrointestinal: Negative for abdominal pain, diarrhea, melena, nausea and vomiting.  Genitourinary: Negative for dysuria, hematuria and urgency.  Musculoskeletal: Negative for falls and joint pain.  Neurological: Negative for dizziness, tingling, sensory change, focal weakness, seizures, weakness and headaches.  Endo/Heme/Allergies: Negative for polydipsia. Does not bruise/bleed easily.  Psychiatric/Behavioral: Negative for depression and memory loss. The patient is not nervous/anxious.     MEDICATIONS AT HOME:   Prior to Admission medications   Medication Sig Start Date End Date Taking? Authorizing Provider  albuterol (PROAIR HFA) 108 (90 Base) MCG/ACT inhaler Inhale 2 puffs into the lungs every 6 (six) hours as needed for wheezing or shortness of breath.    Yes [provider]  aspirin EC 81 MG EC tablet Take 1 tablet (81 mg total) by mouth daily. 06/23/15  Yes Demetrios Loll, MD  atorvastatin (LIPITOR) 80 MG tablet Take 1 tablet (80 mg total) by mouth daily. 05/04/18  Yes Gregor Hams, MD  Blood Glucose Monitoring Suppl (ACCU-CHEK AVIVA PLUS) w/Device KIT See admin instructions. 10/20/17  Yes [provider]  Cholecalciferol (VITAMIN D3) 2000 units capsule Take 1 capsule (2,000 Units total) by mouth daily. 02/27/18  Yes King, Diona Foley, NP  diltiazem (CARDIZEM) 30 MG tablet TAKE 1 TABLET(30 MG) BY MOUTH THREE TIMES DAILY AS NEEDED Patient taking differently: Take 30 mg by mouth 3 (three) times daily as needed.  08/16/18  Yes Gollan, Kathlene November, MD  diltiazem (CARTIA XT) 120 MG 24 hr capsule Take 1 capsule (120 mg total) by mouth daily. 05/04/18  Yes Gregor Hams, MD  DULoxetine (CYMBALTA) 30 MG capsule Take 1 capsule (30 mg total) by mouth daily. Patient taking differently: Take 30 mg by mouth daily. Takes with '60mg'$  for '90mg'$  05/04/18  Yes Gregor Hams, MD  DULoxetine (CYMBALTA) 60 MG capsule Take 60 mg by mouth daily.    Yes [provider]    Fluticasone-Salmeterol (ADVAIR) 250-50 MCG/DOSE AEPB Inhale 1 puff into the lungs 2 (two) times daily.   Yes [provider]  furosemide (LASIX) 40 MG tablet Take 1 tablet (40 mg total) by mouth daily. Take an additional 40 mg in the am for weight gain as needed 05/04/18  Yes Gregor Hams, MD  metFORMIN (GLUCOPHAGE-XR) 500 MG 24 hr tablet Take 500 mg by mouth 2 (two) times daily. 07/13/18  Yes [provider]  oxyCODONE (OXY IR/ROXICODONE) 5 MG immediate release tablet Take 1 tablet (5 mg total) by mouth every 8 (eight) hours as needed for up to 30 days for severe pain. 08/29/18 09/28/18 Yes Vevelyn Francois, NP  OXYGEN Inhale 2 L into the lungs at bedtime.    Yes [provider]  pantoprazole (PROTONIX) 40 MG tablet Take 1 tablet (40 mg total) by mouth daily. 05/04/18  Yes Gregor Hams, MD  potassium chloride SA (K-DUR,KLOR-CON) 20 MEQ tablet Take 20 mEq by mouth 2 (two) times daily.   Yes [provider]  pregabalin (LYRICA) 75 MG capsule Take 1 capsule (75 mg total) by mouth 3 (three) times daily for 30 days. 08/29/18 09/28/18 Yes King, Diona Foley, NP  sucralfate (CARAFATE) 1 G tablet Take 1 tablet (1 g total) by mouth 4 (four) times daily -  with meals and at bedtime. Patient taking differently: Take 1 g by mouth 2 (two) times daily.  06/23/15  Yes Demetrios Loll, MD  tiotropium (SPIRIVA HANDIHALER) 18 MCG inhalation capsule Place 18 mcg into inhaler and inhale daily.   Yes [provider]  tiZANidine (ZANAFLEX) 4 MG tablet Take 1 tablet (4 mg total) by mouth 3 (three) times daily. 08/29/18  Yes Vevelyn Francois, NP  famotidine (PEPCID) 40 MG tablet Take 1 tablet (40 mg total) by mouth every evening. 09/10/18 09/10/19  Alfred Levins, [place], MD  metFORMIN (GLUCOPHAGE) 500 MG tablet Take 1 tablet (500 mg total) by mouth 2 (two) times daily with a meal. 05/04/18   Gregor Hams, MD      VITAL SIGNS:  Blood pressure 130/64, pulse 97, temperature 98.3 F  (36.8 C), resp. rate 19, height '5\' 8"'$  (1.727 m), weight 87.5 kg, last menstrual period 03/13/2015, SpO2 97 %.  PHYSICAL EXAMINATION:  Physical Exam  GENERAL:  53 y.o.-year-old patient lying in the bed in no acute distress.  EYES: Pupils equal, round, reactive to light and accommodation. No scleral icterus. Extraocular muscles intact.  HEENT: Head atraumatic, normocephalic. Oropharynx and nasopharynx clear. No oropharyngeal erythema, dry oral mucosa  NECK:  Supple, no jugular venous distention. No thyroid enlargement, no tenderness.  LUNGS: Normal breath sounds bilaterally, no wheezing, rales, rhonchi. No use of accessory muscles of respiration.  CARDIOVASCULAR: S1, S2 RRR. No murmurs, rubs, gallops, clicks.  ABDOMEN: Soft, nontender, nondistended. Bowel sounds present. No organomegaly or mass.  EXTREMITIES: No pedal edema, cyanosis, or clubbing. + 2 pedal & radial pulses b/l.   NEUROLOGIC: Cranial nerves II through XII are intact. No focal Motor or sensory deficits appreciated b/l PSYCHIATRIC: The patient is alert and oriented x 3.  SKIN: No obvious rash, lesion, or ulcer.   LABORATORY PANEL:   CBC Recent Labs  Lab 09/10/18 0733  WBC 10.5  HGB 11.2*  HCT 34.6*  PLT 171   ------------------------------------------------------------------------------------------------------------------  Chemistries  Recent Labs  Lab 09/10/18 0733 09/10/18 1120  NA 135 136  K 2.3* 2.2*  CL 90* 94*  CO2 28 27  GLUCOSE 190* 146*  BUN <5* <5*  CREATININE 0.62 0.59  CALCIUM 8.9 8.0*  AST 35  --   ALT 32  --   ALKPHOS 121  --   BILITOT 0.8  --    ------------------------------------------------------------------------------------------------------------------  Cardiac Enzymes Recent Labs  Lab 09/10/18 1120  TROPONINI <0.03   ------------------------------------------------------------------------------------------------------------------  RADIOLOGY:  Dg Chest Portable 1  View  Result Date: 09/10/2018 CLINICAL DATA:  Chest pain. EXAM: PORTABLE CHEST 1 VIEW COMPARISON:  05/04/2018 FINDINGS: Cardiomediastinal silhouette is normal. Mediastinal contours appear intact. There is no evidence of focal airspace consolidation, pleural effusion or pneumothorax. Osseous structures are without acute abnormality. Soft tissues are grossly normal. IMPRESSION: No active disease. Electronically Signed   By: Fidela Salisbury M.D.   On: 09/10/2018 07:39     IMPRESSION AND PLAN:   Erica [name]  is a 53 y.o. female with a known history of alcohol abuse, tobacco abuse, history of pancreatitis, diabetes,  COPD, history of congestive heart failure, osteoarthritis who presents to the hospital complaining of some chest pain and incidentally noted to be severely hypokalemic.   1.  Acute hypokalemia-secondary to poor p.o. intake and also alcohol abuse.   -Patient has no evidence of nausea vomiting or diarrhea any volume loss.  We will aggressively replace her potassium orally, check magnesium level and replace if needed.  Follow electrolytes.  2.  Prolonged QT interval-secondary to the severe hypokalemia. - We will repeat EKG in a.m., keep on telemetry for now.  Replace electrolytes as mentioned above.  3.  Chest pain-atypical and likely led to reflux.  Patient's cardiac markers x2 were negative.  4.  Diabetes type 2 without complication- continue metformin, sliding scale insulin.  5.  COPD without exacerbation-continue Spiriva.  Holding off on Advair and duo nebs for now given her prolonged QTC.  6.  Neuropathy-continue Lyrica.  7.  History of chronic diastolic CHF clinically patient is not in congestive heart failure.  Due to her severe hypokalemia I will hold her Lasix for now.  8.  Depression-continue Cymbalta.  9.  Hyperlipidemia-continue atorvastatin.  10.  Chronic pain-continue oxycodone as needed.    All the records are reviewed and case discussed with ED  provider. Management plans discussed with the patient, family and they are in agreement.  CODE STATUS: Full code  TOTAL TIME TAKING CARE OF THIS PATIENT: 40 minutes.    Henreitta Leber M.D on 09/10/2018 at 1:00 PM  Between 7am to 6pm - Pager - [phone]  After 6pm go to [email or web address] - password [email or web address] [facility]  [facility]  Office  [phone]  CC: Primary care physician; Denton Lank, MD

## 2018-09-10 NOTE — Progress Notes (Signed)
   09/10/18 2100  Clinical Encounter Type  Visited With Patient and family together  Visit Type Initial  Referral From Nurse  Spiritual Encounters  Spiritual Needs Brochure  Stress Factors  Family Stress Factors Health changes   Chaplain received an OR to complete or update an AD with the patient. Upon arrival, the patient was sitting up in bed with her partner at the bedside. She expressed a desire to update her AD documentation since she originally completed paperwork ~30 years ago. The patient and her partner shared some of their hardships with getting approval for disability and the challenges they've faced together. They have experienced some hard times and the laughter they share and outlook on life seem to have been protective for them. Her significant other stated that they are people of faith who are trusting God with the patient's health at this time, which is his primary concern. During the visit, the patient's nurse arrived to administer pain medication and the nurse tech came to do an EKG. Chaplain provided a brief summary of the AD documents and left the paperwork with the patient for her review. Will ask on-coming chaplain to follow up tomorrow in case questions arise.

## 2018-09-10 NOTE — ED Triage Notes (Signed)
PT to ER with c/o centralized chest pain that radiates to back.  Pt states pain started several hours ago, denies n/v, SHOB.  Recent hospitalization for gall stones and pancreatitis.  Pt reports some ETOH last night.

## 2018-09-10 NOTE — ED Notes (Signed)
ED TO INPATIENT HANDOFF REPORT  ED Nurse Name and Phone #:  Seward Speck [phone]  S Name/Age/Gender Erica [name] 53 y.o. female Room/Bed: ED16A/ED16A  Code Status   Code Status: Full Code  Home/SNF/Other Home Patient oriented to: self, place, time and situation Is this baseline? Yes   Triage Complete: Triage complete  Chief Complaint CP   Triage Note PT to ER with c/o centralized chest pain that radiates to back.  Pt states pain started several hours ago, denies n/v, SHOB.  Recent hospitalization for gall stones and pancreatitis.  Pt reports some ETOH last night.   Allergies Allergies  Allergen Reactions  . Tramadol Other (See Comments) and Palpitations    Heart Skipping Beats Heart palpatations  . Penicillins Rash    Has patient had a PCN reaction causing immediate rash, facial/tongue/throat swelling, SOB or lightheadedness with hypotension: Yes Has patient had a PCN reaction causing severe rash involving mucus membranes or skin necrosis: No Has patient had a PCN reaction that required hospitalization: No Has patient had a PCN reaction occurring within the last 10 years: No If all of the above answers are "NO", then may proceed with Cephalosporin use.    Level of Care/Admitting Diagnosis ED Disposition    ED Disposition Condition Comment   Admit  Hospital Area: [facility] [[id number]]  Level of Care: Med-Surg [16]  Diagnosis: Hypokalemia [172180]  Admitting Physician: Houston Siren [[id number]]  Attending Physician: Houston Siren [[id number]]  Estimated length of stay: past midnight tomorrow  Certification:: I certify this patient will need inpatient services for at least 2 midnights  PT Class (Do Not Modify): Inpatient [101]  PT Acc Code (Do Not Modify): Private [1]       B Medical/Surgery History Past Medical History:  Diagnosis Date  . (HFpEF) heart failure with preserved ejection fraction (HCC)    a. 05/2015 Echo: EF 60-65%, no rwma,  PASP .  . Arthritis   . Asthma   . Bell's palsy   . Bronchitis   . CHF (congestive heart failure) (HCC)   . COPD (chronic obstructive pulmonary disease) (HCC)   . Diabetes mellitus without complication (HCC)    type II 03/2017  . Gastric ulcer   . Hyperlipidemia   . Hypertension   . OSA (obstructive sleep apnea)    a. did not tolerate CPAP.  Marland Kitchen Pancreatitis    07/2018  . Shoulder injury    6/19   Past Surgical History:  Procedure Laterality Date  . FLEXIBLE BRONCHOSCOPY N/A 06/20/2015   Procedure: FLEXIBLE BRONCHOSCOPY;  Surgeon: Shane Crutch, MD;  Location: [facility] ORS;  Service: Pulmonary;  Laterality: N/A;  . KNEE SURGERY Left    8 knee surgeries     A IV Location/Drains/Wounds Patient Lines/Drains/Airways Status   Active Line/Drains/Airways    Name:   Placement date:   Placement time:   Site:   Days:   Peripheral IV 09/10/18 Right Antecubital   09/10/18    0716    Antecubital   less than 1          Intake/Output Last 24 hours  Intake/Output Summary (Last 24 hours) at 09/10/2018 1757 Last data filed at 09/10/2018 1458 Gross per 24 hour  Intake 100 ml  Output -  Net 100 ml    Labs/Imaging Results for orders placed or performed during the hospital encounter of 09/10/18 (from the past 48 hour(s))  Troponin I - ONCE - STAT     Status: None  Collection Time: 09/10/18  7:33 AM  Result Value Ref Range   Troponin I <0.03 <0.03 ng/mL    Comment: Performed at [facility], [address] [place], [place] [address]  CBC with Differential/Platelet     Status: Abnormal   Collection Time: 09/10/18  7:33 AM  Result Value Ref Range   WBC 10.5 4.0 - 10.5 K/uL   RBC 3.64 (L) 3.87 - 5.11 MIL/uL   Hemoglobin 11.2 (L) 12.0 - 15.0 g/dL   HCT 40.9 (L) 81.1 - 91.4 %   MCV 95.1 80.0 - 100.0 fL   MCH 30.8 26.0 - 34.0 pg   MCHC 32.4 30.0 - 36.0 g/dL   RDW 78.2 (H) 95.6 - 21.3 %   Platelets 171 150 - 400 K/uL   nRBC 0.0 0.0 - 0.2 %   Neutrophils Relative %  57 %   Neutro Abs 6.1 1.7 - 7.7 K/uL   Lymphocytes Relative 22 %   Lymphs Abs 2.3 0.7 - 4.0 K/uL   Monocytes Relative 16 %   Monocytes Absolute 1.6 (H) 0.1 - 1.0 K/uL   Eosinophils Relative 3 %   Eosinophils Absolute 0.3 0.0 - 0.5 K/uL   Basophils Relative 2 %   Basophils Absolute 0.2 (H) 0.0 - 0.1 K/uL   WBC Morphology MORPHOLOGY UNREMARKABLE    Immature Granulocytes 0 %   Abs Immature Granulocytes 0.04 0.00 - 0.07 K/uL   Polychromasia PRESENT     Comment: Performed at [facility], [address] [place], [place] [address]  Comprehensive metabolic panel     Status: Abnormal   Collection Time: 09/10/18  7:33 AM  Result Value Ref Range   Sodium 135 135 - 145 mmol/L   Potassium 2.3 (LL) 3.5 - 5.1 mmol/L    Comment: CRITICAL RESULT CALLED TO, READ BACK BY AND VERIFIED WITH JENNIFER WHITLEY@0822  ON 09/10/18 BY HKP    Chloride 90 (L) 98 - 111 mmol/L   CO2 28 22 - 32 mmol/L   Glucose, Bld 190 (H) 70 - 99 mg/dL   BUN <5 (L) 6 - 20 mg/dL   Creatinine, Ser 8.46 0.44 - 1.00 mg/dL   Calcium 8.9 8.9 - 96.2 mg/dL   Total Protein 7.0 6.5 - 8.1 g/dL   Albumin 3.5 3.5 - 5.0 g/dL   AST 35 15 - 41 U/L   ALT 32 0 - 44 U/L   Alkaline Phosphatase 121 38 - 126 U/L   Total Bilirubin 0.8 0.3 - 1.2 mg/dL   GFR calc non Af Amer >60 >60 mL/min   GFR calc Af Amer >60 >60 mL/min   Anion gap 17 (H) 5 - 15    Comment: Performed at [facility], [address] [place], [place] [address]  Lipase, blood     Status: None   Collection Time: 09/10/18  7:33 AM  Result Value Ref Range   Lipase 38 11 - 51 U/L    Comment: Performed at [facility], [address] [place], [place] [address]  Ethanol     Status: Abnormal   Collection Time: 09/10/18  7:33 AM  Result Value Ref Range   Alcohol, Ethyl (B) 160 (H) <10 mg/dL    Comment: (NOTE) Lowest detectable limit for serum alcohol is 10 mg/dL. For medical purposes only. Performed at [facility], [address] [place], [place] [address]   Troponin I - Once-Timed     Status: None   Collection Time: 09/10/18 11:20 AM  Result Value Ref Range   Troponin I <0.03 <0.03 ng/mL    Comment: Performed at [facility], [address] [place], [place] [address]  Basic metabolic panel     Status: Abnormal   Collection Time: 09/10/18 11:20 AM  Result Value Ref Range   Sodium 136 135 - 145 mmol/L   Potassium 2.2 (LL) 3.5 - 5.1 mmol/L    Comment: CRITICAL RESULT CALLED TO, READ BACK BY AND VERIFIED WITH Camillia Marcy AT 1149 ON 09/10/2018 [facility].    Chloride 94 (L) 98 - 111 mmol/L   CO2 27 22 - 32 mmol/L   Glucose, Bld 146 (H) 70 - 99 mg/dL   BUN <5 (L) 6 - 20 mg/dL   Creatinine, Ser 6.040.59 0.44 - 1.00 mg/dL   Calcium 8.0 (L) 8.9 - 10.3 mg/dL   GFR calc non Af Amer >60 >60 mL/min   GFR calc Af Amer >60 >60 mL/min   Anion gap 15 5 - 15    Comment: Performed at [facility], [address] [place], [place] [address]  Magnesium     Status: Abnormal   Collection Time: 09/10/18 11:20 AM  Result Value Ref Range   Magnesium 1.3 (L) 1.7 - 2.4 mg/dL    Comment: Performed at [facility], [address] [place], [place] [address]   Dg Chest Portable 1 View  Result Date: 09/10/2018 CLINICAL DATA:  Chest pain. EXAM: PORTABLE CHEST 1 VIEW COMPARISON:  05/04/2018 FINDINGS: Cardiomediastinal silhouette is normal. Mediastinal contours appear intact. There is no evidence of focal airspace consolidation, pleural effusion or pneumothorax. Osseous structures are without acute abnormality. Soft tissues are grossly normal. IMPRESSION: No active disease. Electronically Signed   By: Ted Mcalpineobrinka  Dimitrova M.D.   On: 09/10/2018 07:39    Pending Labs Unresulted Labs (From admission, onward)    Start     Ordered   09/17/18 0500  Creatinine, serum  (enoxaparin (LOVENOX)    CrCl >/= 30 ml/min)  Weekly,   STAT    Comments:  while on enoxaparin therapy    09/10/18 1647   09/11/18 0500  Basic metabolic  panel  Tomorrow morning,   STAT     09/10/18 1647   09/11/18 0500  CBC  Tomorrow morning,   STAT     09/10/18 1647   09/11/18 0500  Magnesium  Tomorrow morning,   STAT     09/10/18 1647   09/10/18 1648  HIV antibody (Routine Testing)  Once,   STAT     09/10/18 1647   09/10/18 1648  CBC  (enoxaparin (LOVENOX)    CrCl >/= 30 ml/min)  Once,   STAT    Comments:  Baseline for enoxaparin therapy IF NOT ALREADY DRAWN.  Notify MD if PLT < 100 K.    09/10/18 1647   09/10/18 1648  Creatinine, serum  (enoxaparin (LOVENOX)    CrCl >/= 30 ml/min)  Once,   STAT    Comments:  Baseline for enoxaparin therapy IF NOT ALREADY DRAWN.    09/10/18 1647          Vitals/Pain Today's Vitals   09/10/18 1442 09/10/18 1500 09/10/18 1530 09/10/18 1552  BP:  136/63 132/70   Pulse:  94 92   Resp:  (!) 21 18   Temp:      SpO2:  94% 96%   Weight:      Height:      PainSc: 6    2     Isolation  Precautions No active isolations  Medications Medications  sodium chloride flush (NS) 0.9 % injection 3 mL (3 mLs Intravenous Not Given 09/10/18 0759)  aspirin chewable tablet 324 mg (324 mg Oral Not Given 09/10/18 0748)  aspirin EC tablet 81 mg (81 mg Oral Not Given 09/10/18 1707)  oxyCODONE (Oxy IR/ROXICODONE) immediate release tablet 5 mg (5 mg Oral Given 09/10/18 1443)  atorvastatin (LIPITOR) tablet 80 mg (has no administration in time range)  diltiazem (CARDIZEM) tablet 30 mg (has no administration in time range)  DULoxetine (CYMBALTA) DR capsule 30 mg (30 mg Oral Not Given 09/10/18 1708)  DULoxetine (CYMBALTA) DR capsule 60 mg (60 mg Oral Not Given 09/10/18 1708)  metFORMIN (GLUCOPHAGE-XR) 24 hr tablet 500 mg (has no administration in time range)  sucralfate (CARAFATE) tablet 1 g (has no administration in time range)  pregabalin (LYRICA) capsule 75 mg (has no administration in time range)  potassium chloride SA (K-DUR,KLOR-CON) CR tablet 40 mEq (has no administration in time range)  tiotropium (SPIRIVA)  inhalation capsule ([facility] use ONLY) 18 mcg (18 mcg Inhalation Not Given 09/10/18 1709)  acetaminophen (TYLENOL) tablet 650 mg (has no administration in time range)    Or  acetaminophen (TYLENOL) suppository 650 mg (has no administration in time range)  enoxaparin (LOVENOX) injection 40 mg (40 mg Subcutaneous Given 09/10/18 1707)  insulin aspart (novoLOG) injection 0-9 Units (has no administration in time range)  insulin aspart (novoLOG) injection 0-5 Units (has no administration in time range)  magnesium sulfate IVPB 4 g 100 mL (4 g Intravenous New Bag/Given 09/10/18 1707)  fentaNYL (SUBLIMAZE) injection 50 mcg (50 mcg Intravenous Given 09/10/18 0747)  ondansetron (ZOFRAN) injection 4 mg (4 mg Intravenous Given 09/10/18 0747)  potassium chloride SA (K-DUR,KLOR-CON) CR tablet 40 mEq (40 mEq Oral Given 09/10/18 0902)  potassium chloride 10 mEq in 100 mL IVPB (0 mEq Intravenous Stopped 09/10/18 1022)  sodium chloride 0.9 % bolus 1,000 mL (0 mLs Intravenous Stopped 09/10/18 1116)  alum & mag hydroxide-simeth (MAALOX/MYLANTA) 200-200-20 MG/5ML suspension 30 mL (30 mLs Oral Given 09/10/18 0902)    And  lidocaine (XYLOCAINE) 2 % viscous mouth solution 15 mL (15 mLs Oral Given 09/10/18 0902)  potassium chloride 10 mEq in 100 mL IVPB (0 mEq Intravenous Stopped 09/10/18 1704)    Mobility walks Low fall risk   Focused Assessments Cardiac Assessment Handoff:  Cardiac Rhythm: Normal sinus rhythm Lab Results  Component Value Date   CKTOTAL 56 03/10/2013   CKMB 0.7 03/10/2013   TROPONINI <0.03 09/10/2018   No results found for: DDIMER Does the Patient currently have chest pain? No     R Recommendations: See Admitting Provider Note  Report given to:   Additional Notes:

## 2018-09-10 NOTE — Discharge Instructions (Signed)

## 2018-09-10 NOTE — ED Notes (Signed)
Date and time results received: 09/10/18 8:24 AM    Test: Potassium Critical Value: 2.3  Name of Provider Notified: Dr. Don Perking

## 2018-09-10 NOTE — ED Provider Notes (Signed)
[facility] Emergency Department Provider Note  ____________________________________________  Time seen: Approximately 7:45 AM  I have reviewed the triage vital signs and the nursing notes.   HISTORY  Chief Complaint Chest Pain   HPI Erica [name] is a 53 y.o. female with a history of heart failure with preserved EF, asthma, COPD, smoking, diabetes, peptic ulcer disease, hypertension, hyperlipidemia, OSA, pancreatitis who presents for evaluation of chest pain.  Patient reports the pain started a few hours ago.  Patient reports that the pain is dull, located in the right side of her chest, radiating to her back, currently 5/10.  Pain is not pleuritic in nature, no shortness of breath, no nausea, no vomiting, no diaphoresis, no dizziness.  Patient reports moderate consumption of alcohol last night.  She denies abdominal pain.  Patient reports a chronic cough but no changes on that.  No personal family history of blood clots, recent travel immobilization, leg pain or swelling, hemoptysis, or exogenous hormones.   Past Medical History:  Diagnosis Date  . (HFpEF) heart failure with preserved ejection fraction ([place])    a. 05/2015 Echo: EF 60-65%, no rwma, PASP 56mHg.  . Arthritis   . Asthma   . Bell's palsy   . Bronchitis   . CHF (congestive heart failure) ([place]   . COPD (chronic obstructive pulmonary disease) ([place]   . Diabetes mellitus without complication ([place]    type II 03/2017  . Gastric ulcer   . Hyperlipidemia   . Hypertension   . OSA (obstructive sleep apnea)    a. did not tolerate CPAP.  .Marland KitchenPancreatitis    07/2018  . Shoulder injury    6/19    Patient Active Problem List   Diagnosis Date Noted  . Nausea vomiting and diarrhea 08/13/2018  . Transaminitis 08/13/2018  . Acute pancreatitis 08/13/2018  . Elevated LFTs 08/13/2018  . Peptic ulcer disease 08/13/2018  . COPD (chronic obstructive pulmonary disease) ([place] 08/13/2018  . Obesity (BMI  30.0-34.9) 12/05/2017  . Primary localized osteoarthrosis, pelvic region and thigh 12/05/2017  . Arthropathy of left hip 11/08/2017  . Rib pain on right side 11/08/2017  . Spondylosis without myelopathy or radiculopathy, lumbosacral region 10/06/2017  . Vertigo 09/15/2017  . Diabetes ([place] 07/18/2017  . HTN (hypertension) 07/18/2017  . Chronic hip pain (Secondary source of pain) (Bilateral) (L>R) 05/02/2017  . 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 Hemarthrosis, left knee 11/10/2016  . Osteoarthritis of knee (Bilateral) (L>R) 09/08/2016  . Intermittent left thoracic Muscle cramps 09/08/2016  . Spasm of thoracolumbar muscle (Left) 09/08/2016  . Post-traumatic osteoarthritis of knee (Left) 09/08/2016  . Lumbar spondylosis 07/05/2016  . Chronic pain disorder 06/28/2016  . Elevated sedimentation rate 06/28/2016  . Elevated C-reactive protein (CRP) 06/28/2016  . Neurogenic pain 06/28/2016  . Vitamin D deficiency 06/28/2016  . Long term current use of opiate analgesic 04/29/2016  . Long term prescription opiate use 04/29/2016  . Opiate use 04/29/2016  . Chronic low back pain (Primary Source of Pain) (Bilateral) (L>R) 04/29/2016  . Chronic knee [facility] ([facility]) (Bilateral) (L>R) 04/29/2016  . Chronic neck pain (Bilateral) (R>L) 04/29/2016  . Chronic upper back pain (midline) 04/29/2016  . Chronic foot pain (bottom of feet) (Bilateral) (R>L) 04/29/2016  . Chronic hand pain (Bilateral) 04/29/2016  . Peripheral neuropathy, idiopathic (upper and lower extremity) 04/29/2016  . Chronic sacroiliac joint pain (Bilateral) (R>L) 04/29/2016  . Lumbar facet syndrome (Bilateral) (R>L) 04/29/2016  . Depression 03/05/2016  .  Chronic diastolic heart failure ([place]) 07/16/2015  . Tobacco use disorder 07/16/2015  . Obstructive sleep apnea 07/16/2015  . Tachycardia 07/16/2015  . COPD, mild ([place])   . Renal insufficiency   . Pulmonary HTN ([place])   . Chest pain 06/14/2015  .  Hyponatremia 06/14/2015  . Shortness of breath   . Swelling   . Cough   . COPD (chronic obstructive pulmonary disease) with acute bronchitis ([place])     Past Surgical History:  Procedure Laterality Date  . FLEXIBLE BRONCHOSCOPY N/A 06/20/2015   Procedure: FLEXIBLE BRONCHOSCOPY;  Surgeon: Laverle Hobby, MD;  Location: [facility] ORS;  Service: Pulmonary;  Laterality: N/A;  . KNEE SURGERY Left    8 knee surgeries    Prior to Admission medications   Medication Sig Start Date End Date Taking? Authorizing Provider  albuterol (PROAIR HFA) 108 (90 Base) MCG/ACT inhaler Inhale 2 puffs into the lungs every 6 (six) hours as needed for wheezing or shortness of breath.     [provider]  aspirin EC 81 MG EC tablet Take 1 tablet (81 mg total) by mouth daily. 06/23/15   Demetrios Loll, MD  atorvastatin (LIPITOR) 80 MG tablet Take 1 tablet (80 mg total) by mouth daily. 05/04/18   Gregor Hams, MD  Blood Glucose Monitoring Suppl (ACCU-CHEK AVIVA PLUS) w/Device KIT See admin instructions. 10/20/17   [provider]  Cholecalciferol (VITAMIN D3) 2000 units capsule Take 1 capsule (2,000 Units total) by mouth daily. 02/27/18   Vevelyn Francois, NP  diltiazem (CARDIZEM) 30 MG tablet TAKE 1 TABLET(30 MG) BY MOUTH THREE TIMES DAILY AS NEEDED 08/16/18   Minna Merritts, MD  diltiazem (CARTIA XT) 120 MG 24 hr capsule Take 1 capsule (120 mg total) by mouth daily. 05/04/18   Gregor Hams, MD  DULoxetine (CYMBALTA) 30 MG capsule Take 1 capsule (30 mg total) by mouth daily. Patient taking differently: Take 30 mg by mouth daily. Takes with 22m for 956m10/17/19   BrGregor HamsMD  DULoxetine (CYMBALTA) 60 MG capsule Take 60 mg by mouth daily. Takes with 3061mor 59m68m [provider]  famotidine (PEPCID) 40 MG tablet Take 1 tablet (40 mg total) by mouth every evening. 09/10/18 09/10/19  VeroAlfred Levinsro[place]  fluconazole (DIFLUCAN) 150 MG tablet TAKE 1 TABLET NOW FOR YEAST  INFECTION, MAY REPEAT IN 3 DAYS IF SYMPTOMS PERSIST 10/17/17   [provider]  Fluticasone-Salmeterol (ADVAIR) 250-50 MCG/DOSE AEPB Inhale 1 puff into the lungs 2 (two) times daily.    [provider]  folic acid (FOLVITE) 1 MG tablet Take 1 mg by mouth daily.    [provider]  furosemide (LASIX) 40 MG tablet Take 1 tablet (40 mg total) by mouth daily. Take an additional 40 mg in the am for weight gain as needed 05/04/18   BrowGregor Hams  Magnesium Oxide, Antacid, 500 MG CAPS Take 500 mg by mouth daily.  11/08/16   [provider]  metFORMIN (GLUCOPHAGE) 500 MG tablet Take 1 tablet (500 mg total) by mouth 2 (two) times daily with a meal. 05/04/18   BrowGregor Hams  metFORMIN (GLUCOPHAGE-XR) 500 MG 24 hr tablet Take 500 mg by mouth 2 (two) times daily. 07/13/18   [provider]  oxyCODONE (OXY IR/ROXICODONE) 5 MG immediate release tablet Take 1 tablet (5 mg total) by mouth every 8 (eight) hours as needed for up to 30 days for severe pain. 08/29/18 09/28/18  KingEdison Pace  Crystal M, NP  OXYGEN Inhale 2 L into the lungs at bedtime.     [provider]  pantoprazole (PROTONIX) 40 MG tablet Take 1 tablet (40 mg total) by mouth daily. 05/04/18   Gregor Hams, MD  potassium chloride SA (K-DUR,KLOR-CON) 20 MEQ tablet Take 20 mEq by mouth 2 (two) times daily.    [provider]  pregabalin (LYRICA) 75 MG capsule Take 1 capsule (75 mg total) by mouth 3 (three) times daily for 30 days. 08/29/18 09/28/18  Vevelyn Francois, NP  sucralfate (CARAFATE) 1 G tablet Take 1 tablet (1 g total) by mouth 4 (four) times daily -  with meals and at bedtime. Patient taking differently: Take 1 g by mouth 2 (two) times daily.  06/23/15   Demetrios Loll, MD  tiotropium (SPIRIVA HANDIHALER) 18 MCG inhalation capsule Place 18 mcg into inhaler and inhale daily.    [provider]  tiZANidine (ZANAFLEX) 4 MG tablet Take 1 tablet (4 mg total) by mouth 3 (three)  times daily. 08/29/18   Vevelyn Francois, NP    Allergies Tramadol and Penicillins  Family History  Problem Relation Age of Onset  . Lung cancer Father   . Breast cancer Mother        early 72's  . Diabetes Maternal Grandmother     Social History Social History   Tobacco Use  . Smoking status: Former Smoker    Packs/day: 1.00    Years: 36.00    Pack years: 36.00    Types: Cigarettes    Last attempt to quit: 06/13/2015    Years since quitting: 3.2  . Smokeless tobacco: Never Used  Substance Use Topics  . Alcohol use: No    Alcohol/week: 0.0 standard drinks    Frequency: Never    Comment: occasionaly  . Drug use: No    Review of Systems  Constitutional: Negative for fever. Eyes: Negative for visual changes. ENT: Negative for sore throat. Neck: No neck pain  Cardiovascular: + chest pain. Respiratory: Negative for shortness of breath. Gastrointestinal: Negative for abdominal pain, vomiting or diarrhea. Genitourinary: Negative for dysuria. Musculoskeletal: Negative for back pain. Skin: Negative for rash. Neurological: Negative for headaches, weakness or numbness. Psych: No SI or HI  ____________________________________________   PHYSICAL EXAM:  VITAL SIGNS: ED Triage Vitals  Enc Vitals Group     BP 09/10/18 0718 (!) 149/74     Pulse Rate 09/10/18 0718 95     Resp 09/10/18 0718 18     Temp 09/10/18 0718 98.3 F (36.8 C)     Temp src --      SpO2 09/10/18 0718 93 %     Weight 09/10/18 0719 192 lb 15.9 oz (87.5 kg)     Height 09/10/18 0719 5' 8"  (1.727 m)     Head Circumference --      Peak Flow --      Pain Score 09/10/18 0719 6     Pain Loc --      Pain Edu? --      Excl. in [place]? --     Constitutional: Alert and oriented. Well appearing and in no apparent distress. HEENT:      Head: Normocephalic and atraumatic.         Eyes: Conjunctivae are normal. Sclera is non-icteric.       Mouth/Throat: Mucous membranes are moist.       Neck: Supple with no  signs of meningismus. Cardiovascular: Regular rate and rhythm. V/VI  systolic murmur at the upper sternal border. 2+ symmetrical distal pulses are present in all extremities. No JVD. Respiratory: Normal respiratory effort. Lungs are clear to auscultation bilaterally. No wheezes, crackles, or rhonchi.  Gastrointestinal: Soft, non tender, and non distended with positive bowel sounds. No rebound or guarding. Musculoskeletal: Nontender with normal range of motion in all extremities. No edema, cyanosis, or erythema of extremities. Neurologic: Normal speech and language. Face is symmetric. Moving all extremities. No gross focal neurologic deficits are appreciated. Skin: Skin is warm, dry and intact. No rash noted. Psychiatric: Mood and affect are normal. Speech and behavior are normal.  ____________________________________________   LABS (all labs ordered are listed, but only abnormal results are displayed)  Labs Reviewed  CBC WITH DIFFERENTIAL/PLATELET - Abnormal; Notable for the following components:      Result Value   RBC 3.64 (*)    Hemoglobin 11.2 (*)    HCT 34.6 (*)    RDW 22.9 (*)    Monocytes Absolute 1.6 (*)    Basophils Absolute 0.2 (*)    All other components within normal limits  COMPREHENSIVE METABOLIC PANEL - Abnormal; Notable for the following components:   Potassium 2.3 (*)    Chloride 90 (*)    Glucose, Bld 190 (*)    BUN <5 (*)    Anion gap 17 (*)    All other components within normal limits  ETHANOL - Abnormal; Notable for the following components:   Alcohol, Ethyl (B) 160 (*)    All other components within normal limits  BASIC METABOLIC PANEL - Abnormal; Notable for the following components:   Potassium 2.2 (*)    Chloride 94 (*)    Glucose, Bld 146 (*)    BUN <5 (*)    Calcium 8.0 (*)    All other components within normal limits  TROPONIN I  LIPASE, BLOOD  TROPONIN I  MAGNESIUM   ____________________________________________  EKG  ED ECG REPORT I,  Rudene Re, the attending physician, personally viewed and interpreted this ECG.   Normal sinus rhythm, rate of 94, prolonged QTc, normal axis, no ST elevations or depressions. Prolonged QT is new when  compared to prior ____________________________________________  RADIOLOGY  I have personally reviewed the images performed during this visit and I agree with the Radiologist's read.   Interpretation by Radiologist:  Dg Chest Portable 1 View  Result Date: 09/10/2018 CLINICAL DATA:  Chest pain. EXAM: PORTABLE CHEST 1 VIEW COMPARISON:  05/04/2018 FINDINGS: Cardiomediastinal silhouette is normal. Mediastinal contours appear intact. There is no evidence of focal airspace consolidation, pleural effusion or pneumothorax. Osseous structures are without acute abnormality. Soft tissues are grossly normal. IMPRESSION: No active disease. Electronically Signed   By: Fidela Salisbury M.D.   On: 09/10/2018 07:39     ____________________________________________   PROCEDURES  Procedure(s) performed: None Procedures Critical Care performed: yes  CRITICAL CARE Performed by: Rudene Re  ?  Total critical care time: 30 min  Critical care time was exclusive of separately billable procedures and treating other patients.  Critical care was necessary to treat or prevent imminent or life-threatening deterioration.  Critical care was time spent personally by me on the following activities: development of treatment plan with patient and/or surrogate as well as nursing, discussions with consultants, evaluation of patient's response to treatment, examination of patient, obtaining history from patient or surrogate, ordering and performing treatments and interventions, ordering and review of laboratory studies, ordering and review of radiographic studies, pulse oximetry and re-evaluation of patient's condition.  ____________________________________________   INITIAL IMPRESSION / ASSESSMENT  AND PLAN / ED COURSE  53 y.o. female with a history of heart failure with preserved EF, asthma, COPD, smoking, diabetes, peptic ulcer disease, hypertension, hyperlipidemia, OSA, pancreatitis who presents for evaluation of R sided chest pain radiating to her back constant for a few hours. +EtOH last night. No SOB.  Patient looks well appearing in no distress, normal vitals, lungs are clear to auscultation with good air movement and no wheezing, abdomen is soft with no tenderness.  Ddx gastritis, GERD, PNA, PTX, ACS, PE, pleurisy.  Plan for EKG, CXR, labs. Patient given ASA per EMS.   Labs showing hypokalemia with prolonged QTc. Patient on telemetry, no evidence of dysrhythmias.  Will supplement p.o. and IV  Clinical Course as of Sep 10 1205  Sun Sep 10, 2018  1206 Patient received 40 mEq of p.o. potassium and 10 IV.  Repeat potassium remains low.  Since patient has prolonged QTC will admit to telemetry bed for IV supplementation.  I have ordered 10 mEq an hour for the next 4 hours.  Will check her magnesium level as well.  Discussed with Dr. Verdell Carmine for admission.   [CV]    Clinical Course User Index [CV] Alfred Levins [place], MD     As part of my medical decision making, I reviewed the following data within the [facility] notes reviewed and incorporated, Labs reviewed , EKG interpreted , Old EKG reviewed, Old chart reviewed, Radiograph reviewed , Discussed with admitting physician , Notes from prior ED visits and [place] Controlled Substance Database    Pertinent labs & imaging results that were available during my care of the patient were reviewed by me and considered in my medical decision making (see chart for details).    ____________________________________________   FINAL CLINICAL IMPRESSION(S) / ED DIAGNOSES  Final diagnoses:  Chest pain, unspecified type  Acute alcoholic gastritis without hemorrhage  Hypokalemia  Alcoholic ketoacidosis  Prolonged Q-T  interval on ECG      NEW MEDICATIONS STARTED DURING THIS VISIT:  ED Discharge Orders         Ordered    famotidine (PEPCID) 40 MG tablet  Every evening     09/10/18 1052           Note:  This document was prepared using Dragon voice recognition software and may include unintentional dictation errors.    Alfred Levins, [place], MD 09/10/18 [phone]

## 2018-09-10 NOTE — ED Notes (Signed)
Called pharmacy to send additional potassium doses

## 2018-09-11 ENCOUNTER — Encounter: Payer: Self-pay | Admitting: Gastroenterology

## 2018-09-11 ENCOUNTER — Encounter

## 2018-09-11 ENCOUNTER — Inpatient Hospital Stay: Payer: Medicaid Other

## 2018-09-11 ENCOUNTER — Ambulatory Visit: Payer: Medicaid Other | Admitting: Gastroenterology

## 2018-09-11 ENCOUNTER — Inpatient Hospital Stay (HOSPITAL_COMMUNITY)
Admit: 2018-09-11 | Discharge: 2018-09-11 | Disposition: A | Discharge status: Home / Self Care | Payer: Medicaid Other | Attending: Physician Assistant | Admitting: Physician Assistant

## 2018-09-11 DIAGNOSIS — D649 Anemia, unspecified: Secondary | ICD-10-CM

## 2018-09-11 DIAGNOSIS — K2920 Alcoholic gastritis without bleeding: Secondary | ICD-10-CM

## 2018-09-11 DIAGNOSIS — R079 Chest pain, unspecified: Secondary | ICD-10-CM

## 2018-09-11 DIAGNOSIS — E876 Hypokalemia: Principal | ICD-10-CM

## 2018-09-11 LAB — BASIC METABOLIC PANEL
Anion gap: 10 (ref 5–15)
BUN: 7 mg/dL (ref 6–20)
CO2: 31 mmol/L (ref 22–32)
Calcium: 8.2 mg/dL — ABNORMAL LOW (ref 8.9–10.3)
Chloride: 96 mmol/L — ABNORMAL LOW (ref 98–111)
Creatinine, Ser: 0.54 mg/dL (ref 0.44–1.00)
GFR calc Af Amer: >60 mL/min (ref >60)
GFR calc non Af Amer: >60 mL/min (ref >60)
Glucose, Bld: 134 mg/dL — ABNORMAL HIGH (ref 70–99)
Potassium: 3.1 mmol/L — ABNORMAL LOW (ref 3.5–5.1)
SODIUM: 137 mmol/L (ref 135–145)

## 2018-09-11 LAB — LIPID PANEL
Cholesterol: 145 mg/dL (ref 0–200)
HDL: 42 mg/dL (ref >40)
LDL Cholesterol: 70 mg/dL (ref 0–99)
Total CHOL/HDL Ratio: 3.5 RATIO
Triglycerides: 163 mg/dL — ABNORMAL HIGH (ref <150)
VLDL: 33 mg/dL (ref 0–40)

## 2018-09-11 LAB — CBC
HCT: 32.2 % — ABNORMAL LOW (ref 36.0–46.0)
Hemoglobin: 10.2 g/dL — ABNORMAL LOW (ref 12.0–15.0)
MCH: 30 pg (ref 26.0–34.0)
MCHC: 31.7 g/dL (ref 30.0–36.0)
MCV: 94.7 fL (ref 80.0–100.0)
Platelets: 167 10*3/uL (ref 150–400)
RBC: 3.4 MIL/uL — ABNORMAL LOW (ref 3.87–5.11)
RDW: 23.3 % — AB (ref 11.5–15.5)
WBC: 7.7 10*3/uL (ref 4.0–10.5)
nRBC: 0 % (ref 0.0–0.2)

## 2018-09-11 LAB — ECHOCARDIOGRAM COMPLETE
Height: 68 in
Weight: 3087.85 oz

## 2018-09-11 LAB — GLUCOSE, CAPILLARY
Glucose-Capillary: 141 mg/dL — ABNORMAL HIGH (ref 70–99)
Glucose-Capillary: 174 mg/dL — ABNORMAL HIGH (ref 70–99)
Glucose-Capillary: 115 mg/dL — ABNORMAL HIGH (ref 70–99)
Glucose-Capillary: 119 mg/dL — ABNORMAL HIGH (ref 70–99)

## 2018-09-11 LAB — HEMOGLOBIN A1C
Hgb A1c MFr Bld: 6 % — ABNORMAL HIGH (ref 4.8–5.6)
Mean Plasma Glucose: 125.5 mg/dL

## 2018-09-11 LAB — FIBRIN DERIVATIVES D-DIMER (ARMC ONLY): Fibrin derivatives D-dimer (ARMC): 927.09 ng/mL (FEU) — ABNORMAL HIGH (ref 0.00–499.00)

## 2018-09-11 LAB — MAGNESIUM: Magnesium: 2.4 mg/dL (ref 1.7–2.4)

## 2018-09-11 MED ORDER — POTASSIUM CHLORIDE 10 MEQ/100ML IV SOLN
10.0000 meq | INTRAVENOUS | Status: AC
  Administered 2018-09-11 (×4): 10 meq via INTRAVENOUS
  Filled 2018-09-11 (×2): qty 100

## 2018-09-11 MED ORDER — OXYCODONE-ACETAMINOPHEN 5-325 MG PO TABS
1.0000 | ORAL_TABLET | ORAL | Status: DC | PRN
  Administered 2018-09-11 – 2018-09-12 (×4): 1 via ORAL
  Filled 2018-09-11 (×4): qty 1

## 2018-09-11 MED ORDER — IOHEXOL 350 MG/ML SOLN
75.0000 mL | Freq: Once | INTRAVENOUS | Status: AC | PRN
  Administered 2018-09-11: 75 mL via INTRAVENOUS

## 2018-09-11 MED ORDER — MORPHINE SULFATE (PF) 2 MG/ML IV SOLN
2.0000 mg | Freq: Four times a day (QID) | INTRAVENOUS | Status: DC | PRN
  Administered 2018-09-11: 2 mg via INTRAVENOUS
  Filled 2018-09-11: qty 1

## 2018-09-11 MED ORDER — SODIUM CHLORIDE 0.9 % IV SOLN
INTRAVENOUS | Status: DC | PRN
  Administered 2018-09-11: 250 mL via INTRAVENOUS

## 2018-09-11 NOTE — Consult Note (Signed)
PHARMACY CONSULT NOTE - FOLLOW UP  Pharmacy Consult for Electrolyte Monitoring and Replacement   Recent Labs: Potassium (mmol/L)  Date Value  09/11/2018 3.1 (L)  02/26/2014 3.7   Magnesium (mg/dL)  Date Value  70/96/2836 2.4   Calcium (mg/dL)  Date Value  62/94/7654 8.2 (L)   Calcium, Total (mg/dL)  Date Value  65/09/5463 8.3 (L)   Albumin (g/dL)  Date Value  68/06/7516 3.5  03/10/2013 3.6   Sodium (mmol/L)  Date Value  09/11/2018 137  04/22/2016 140  02/26/2014 139     Assessment: 53 y.o. female with a h/o alcohol abuse, tobacco abuse,  pancreatitis, diabetes, COPD, CHF, OA who presents to the hospital complaining of some chest pain and incidentally noted to be severely hypokalemic. Since her admission yesterday she has received 50 mEq IV KCl and 80 mEq oral KCl: potassium improved from 2.2 to 3.1 mmol/L. She is at high risk for re-feeding.  Goal of Therapy:  Electrolytes wnl  Plan:  We will give an additional 40 mEq IV KCl. She is scheduled to receive an additional dose of oral KCl 40 mEq this evening. Labs are ordered for tomorrow morning and pharmacy will replace as needed.  Lowella Bandy ,PharmD Clinical Pharmacist 09/11/2018 10:59 AM

## 2018-09-11 NOTE — Telephone Encounter (Signed)
error 

## 2018-09-11 NOTE — Progress Notes (Signed)
[employer] [facility] [place] [facility]                                                                                                                                                                                  Patient Demographics   Erica [name], is a 53 y.o. female, DOB - Oct 03, 1965, UUV:[id number]  Admit date - 09/10/2018   Admitting Physician Houston Siren, MD  Outpatient Primary MD for the patient is Hillery Aldo, MD   LOS - 1  Subjective: Patient doing better still complains of some chest pain    Review of Systems:   CONSTITUTIONAL: No documented fever. No fatigue, weakness. No weight gain, no weight loss.  EYES: No blurry or double vision.  ENT: No tinnitus. No postnasal drip. No redness of the oropharynx.  RESPIRATORY: No cough, no wheeze, no hemoptysis. No dyspnea.  CARDIOVASCULAR: Positive chest pain. No orthopnea. No palpitations. No syncope.  GASTROINTESTINAL: No nausea, no vomiting or diarrhea. No abdominal pain. No melena or hematochezia.  GENITOURINARY: No dysuria or hematuria.  ENDOCRINE: No polyuria or nocturia. No heat or cold intolerance.  HEMATOLOGY: No anemia. No bruising. No bleeding.  INTEGUMENTARY: No rashes. No lesions.  MUSCULOSKELETAL: No arthritis. No swelling. No gout.  NEUROLOGIC: No numbness, tingling, or ataxia. No seizure-type activity.  PSYCHIATRIC: No anxiety. No insomnia. No ADD.    Vitals:   Vitals:   09/10/18 1530 09/10/18 1828 09/10/18 2029 09/11/18 0541  BP: 132/70 128/61 136/66 (!) 120/58  Pulse: 92 97 91 85  Resp: 18 18 20 18   Temp:  99.1 F (37.3 C) 98.5 F (36.9 C) 98.5 F (36.9 C)  TempSrc:  Oral Oral Oral  SpO2: 96% 96% 96% 92%  Weight:      Height:        Wt Readings from Last 3 Encounters:  09/10/18 87.5 kg  08/29/18 87.5 kg  08/12/18 90.7 kg     Intake/Output Summary (Last 24 hours) at 09/11/2018 1335 Last data filed at 09/11/2018 0900 Gross per 24 hour  Intake 340 ml  Output 600 ml  Net  -260 ml    Physical Exam:   GENERAL: Pleasant-appearing in no apparent distress.  HEAD, EYES, EARS, NOSE AND THROAT: Atraumatic, normocephalic. Extraocular muscles are intact. Pupils equal and reactive to light. Sclerae anicteric. No conjunctival injection. No oro-pharyngeal erythema.  NECK: Supple. There is no jugular venous distention. No bruits, no lymphadenopathy, no thyromegaly.  HEART: Regular rate and rhythm,. No murmurs, no rubs, no clicks.  LUNGS: Clear to auscultation bilaterally. No rales or rhonchi. No wheezes.  ABDOMEN: Soft, flat, nontender, nondistended. Has good bowel sounds. No hepatosplenomegaly appreciated.  EXTREMITIES: No evidence  of any cyanosis, clubbing, or peripheral edema.  +2 pedal and radial pulses bilaterally.  NEUROLOGIC: The patient is alert, awake, and oriented x3 with no focal motor or sensory deficits appreciated bilaterally.  SKIN: Moist and warm with no rashes appreciated.  Psych: Not anxious, depressed LN: No inguinal LN enlargement    Antibiotics   Anti-infectives (From admission, onward)   None      Medications   Scheduled Meds: . aspirin  324 mg Oral Once  . aspirin EC  81 mg Oral Daily  . atorvastatin  80 mg Oral Daily  . DULoxetine  30 mg Oral Daily  . DULoxetine  60 mg Oral Daily  . enoxaparin (LOVENOX) injection  40 mg Subcutaneous Q24H  . insulin aspart  0-5 Units Subcutaneous QHS  . insulin aspart  0-9 Units Subcutaneous TID WC  . metFORMIN  500 mg Oral BID WC  . potassium chloride SA  40 mEq Oral BID  . pregabalin  75 mg Oral TID  . sodium chloride flush  3 mL Intravenous Once  . sucralfate  1 g Oral BID  . tiotropium  18 mcg Inhalation Daily   Continuous Infusions: . sodium chloride 250 mL (09/11/18 1210)  . potassium chloride 10 mEq (09/11/18 1212)   PRN Meds:.sodium chloride, acetaminophen **OR** acetaminophen, diltiazem, morphine injection, oxyCODONE-acetaminophen   Data Review:   Micro Results No results found  for this or any previous visit (from the past 240 hour(s)).  Radiology Reports Dg Chest Portable 1 View  Result Date: 09/10/2018 CLINICAL DATA:  Chest pain. EXAM: PORTABLE CHEST 1 VIEW COMPARISON:  05/04/2018 FINDINGS: Cardiomediastinal silhouette is normal. Mediastinal contours appear intact. There is no evidence of focal airspace consolidation, pleural effusion or pneumothorax. Osseous structures are without acute abnormality. Soft tissues are grossly normal. IMPRESSION: No active disease. Electronically Signed   By: Ted Mcalpine M.D.   On: 09/10/2018 07:39   US Abdomen Limited Ruq  Result Date: 08/12/2018 CLINICAL DATA:  Transaminitis. EXAM: ULTRASOUND ABDOMEN LIMITED RIGHT UPPER QUADRANT COMPARISON:  None. FINDINGS: Gallbladder: No gallstones or wall thickening visualized. Gallbladder sludge is present. No sonographic Murphy sign noted by sonographer. Common bile duct: Diameter: 4.2 mm Liver: No focal lesion identified. Mildly increased parenchymal echogenicity. Portal vein is patent on color Doppler imaging with normal direction of blood flow towards the liver. IMPRESSION: Gallbladder sludge without evidence of acute cholecystitis. Mildly increased parenchymal echogenicity of the liver, usually associated with hepatic steatosis or fibrosis. Electronically Signed   By: Ted Mcalpine M.D.   On: 08/12/2018 22:10     CBC Recent Labs  Lab 09/10/18 0733 09/11/18 0527  WBC 10.5 7.7  HGB 11.2* 10.2*  HCT 34.6* 32.2*  PLT 171 167  MCV 95.1 94.7  MCH 30.8 30.0  MCHC 32.4 31.7  RDW 22.9* 23.3*  LYMPHSABS 2.3  --   MONOABS 1.6*  --   EOSABS 0.3  --   BASOSABS 0.2*  --     Chemistries  Recent Labs  Lab 09/10/18 0733 09/10/18 1120 09/11/18 0527  NA 135 136 137  K 2.3* 2.2* 3.1*  CL 90* 94* 96*  CO2 GLUCOSE 190* 146* 134*  BUN <5* <5* 7  CREATININE 0.62 0.59 0.54  CALCIUM 8.9 8.0* 8.2*  MG  --  1.3* 2.4  AST 35  --   --   ALT 32  --   --   ALKPHOS 121  --    --   BILITOT 0.8  --   --    ------------------------------------------------------------------------------------------------------------------  estimated creatinine clearance is 93 mL/min (by C-G formula based on SCr of 0.54 mg/dL). ------------------------------------------------------------------------------------------------------------------ No results for input(s): HGBA1C in the last 72 hours. ------------------------------------------------------------------------------------------------------------------ Recent Labs    09/11/18 0527  CHOL 145  HDL 42  LDLCALC 70  TRIG 163*  CHOLHDL 3.5   ------------------------------------------------------------------------------------------------------------------ No results for input(s): TSH, T4TOTAL, T3FREE, THYROIDAB in the last 72 hours.  Invalid input(s): FREET3 ------------------------------------------------------------------------------------------------------------------ No results for input(s): VITAMINB12, FOLATE, FERRITIN, TIBC, IRON, RETICCTPCT in the last 72 hours.  Coagulation profile No results for input(s): INR, PROTIME in the last 168 hours.  No results for input(s): DDIMER in the last 72 hours.  Cardiac Enzymes Recent Labs  Lab 09/10/18 0733 09/10/18 1120 09/10/18 2047  TROPONINI <0.03 <0.03 <0.03   ------------------------------------------------------------------------------------------------------------------ Invalid input(s): POCBNP    Assessment & Plan   Erica [name]  is a 53 y.o. female with a known history of alcohol abuse, tobacco abuse, history of pancreatitis, diabetes, COPD, history of congestive heart failure, osteoarthritis who presents to the hospital complaining of some chest pain and incidentally noted to be severely hypokalemic.   1.  Acute hypokalemia-secondary to poor p.o. intake and also alcohol abuse.   -Potassium still low will replace  2.  Prolonged QT interval-secondary to the  severe hypokalemia. - Continue to monitor  3.  Chest pain-atypical and likely led to reflux.    Continues to complain of chest pain has seen cardiology as outpatient I have asked them to evaluate the patient to check a d-dimer  4.  Diabetes type 2 without complication- continue metformin, sliding scale insulin.  5.  COPD without exacerbation-continue Spiriva.  .  6.  Neuropathy-continue Lyrica.  7.  History of chronic diastolic CHF clinically patient is not in congestive heart failure.    Resume Lasix on discharge  8.  Depression-continue Cymbalta.  9.  Hyperlipidemia-continue atorvastatin.  10.  Chronic pain-continue oxycodone as needed.     Code Status Orders  (From admission, onward)         Start     Ordered   09/10/18 1648  Full code  Continuous     09/10/18 1647        Code Status History    Date Active Date Inactive Code Status Order ID Comments User Context   06/14/2015 0201 06/23/2015 1824 Full Code [id number]  Ihor Austin, MD Inpatient    Advance Directive Documentation     Most Recent Value  Type of Advance Directive  Living will  Pre-existing out of facility DNR order (yellow form or pink MOST form)  -  "MOST" Form in Place?  -           Consults cardiology  DVT Prophylaxis  Lovenox   Lab Results  Component Value Date   PLT 167 09/11/2018     Time Spent in minutes   35 minutes greater than 50% of time spent in care coordination and counseling patient regarding the condition and plan of care.   Auburn Bilberry M.D on 09/11/2018 at 1:35 PM  Between 7am to 6pm - Pager - [phone]  After 6pm go to [email or web address] - Social research officer, government  [employer]   Office  [phone]

## 2018-09-11 NOTE — Consult Note (Signed)
Cardiology Consultation:   Patient ID: Erica [name] MRN: [id number]; DOB: [birth year]  Admit date: 09/10/2018 Date of Consult: 09/11/2018  Primary Care Provider: Denton Lank, MD Primary Cardiologist: Ida Rogue, MD  Primary Electrophysiologist:  None    Patient Profile:   Erica [name] is a 53 y.o. female with a hx of HFpEF (EF 55-65%, 10/2017), asthma, COPD, DM2, hypertension, hyperlipidemia, h/o current alcohol use and current tobacco abuse (1 pk daily), h/o recent pancreatitis and electrolyte abnormalities 07/2018, OA on pain management, and OSA on patient reported home O2 who is being seen today for the evaluation of chest pain at the request of Dr. Posey Pronto.  History of Present Illness:   Erica [name] was recently hospitalized at [facility] 1/26-1/28 for electrolyte abnormalities and acute pancreatitis.    Last seen by [facility] in the office 09/08/2017.  At that time, it was noted she continued to be followed in the heart failure clinic with last visit December 2018.  During that office visit, she noted intermittent chest discomfort, occurring in the evening in the setting of elevated blood pressure while at rest.  Chest pain may or may not have been associated with DOE.  She reported symptoms typically lasted under 5 minutes and resolve spontaneously.  She also felt there was alleviation of chest discomfort when she took diltiazem 30 mg PRN when experiencing chest discomfort in the setting of elevated pressures.  She denied any palpitations with her chest discomfort.  It was noted that she had previously been prescribed losartan 100 mg daily but felt that it may have lowered her blood pressure too much and therefore was only taking half tablet daily at that time.  She reported home blood pressures in the mornings with systolic blood pressure in the 130s but evening pressures in the 170s to 180s.  She noted that the chest discomfort typically occurred with elevated SBP.  She also noted some degree of  chronic DOE and admitted to being fairly sedentary.  Given her risk factors for cardiac disease, stress test was ordered and completed 10/21/2017 and ruled low risk study.  Left ventricular ejection fraction was estimated at 55 to 65% at that time.  Since that time, she continued to obtain SBP in the 130s at home but reported that she does now occasionally also experience palpiatations. She has continued to be fairly sedentary, reporting hip and back issues with DOE.  She does not wear a CPAP at night despite known OSA as the previous CPAP mask she tried gave her panic attacks. She continues breathing treatments for asthma. She sleeps in a recliner but she stated this is in part d/t OSA and back and hip pain. She had previously quit smoking, but reported that she recently was under a lot of stress and restarted smoking at 1 pack a day (decreased from 4 packs daily).  She stated she was supposed to start Chantix the day of her admission and reported a desire to quit again.  She also reported current alcohol use with most recent alcohol the night prior to her morning chest pain on 2/23.  She reported that she had several white [place] drinks to celebrate her birthday the previous night 2/22 and with friends.  Other than celebrating with friends, she reported only occasional alcohol use.  She reported medication compliance and that she continued to follow at the heart failure clinic with [facility] with no recent appointments per EMR.  She also reported that she completely stopped her ARB/losartan due to "  blacking out and falling and hurting herself repeatedly, despite reduced dosage." She stated her ARB was stopped d/t PCP discontinuig it.  She reportedly now uses home oxygen as well.  1/25 [facility] ED documentation noted she presented to the ED that day with abdominal pain, nausea, and vomiting and labs showed elevated liver enzymes and recommendation for admission for viral hepatitis; however, patient preferred to  go home and follow-up as an outpatient and no further workup. Admitted at [facility] 1/26 shortly thereafter for pancreatitis and electrolyte abnormalities after 5 days of abdominal pain, nausea, diarrhea, and vomiting.  After discharge at [facility], patient self reported a recent episode of bronchitis with PCP outpatient treatment; however, unable to find this in her EMR.  She estimated this bronchitis as occurring approximately 2 weeks prior to her current admission and associated with emesis as well.  Most recent CXR (this admission) without active disease.  She presented to [facility] ED on 09/10/2018 with complaint of current central, substernal chest pain/pressure.  She rated the initial pain/pressure as 7.5/10 in intensity and starting at 5 AM on 2/23.  She stated this pain/pressure had been constant and was thus still present during today's 2/24 exam. Since admission, however, the pain severity had decreased to 3/10 with no known alleviating factors other than time. Pain described the chest pain / pressure as non-radiating and pleuritic; however, she noted it "shot through to her back at first but not since then" and also was not made worse with coughing.  She was not TTP. Pain was reportedly made better when sitting up and worse when laying down. Associated sx included burping, bloating, and nausea. No associated shortness of breath, diaphoresis, or syncope.  Patient reported last emesis as 2/19 (last week). She noted a history of irregular bowel movements, sometimes going at least 1 week before having a bowel movement and was unable to estimate last bowel movement. In the ED she was noted to be severely hypokalemic with potassium 2.2 and Mg 1.3 and given aggressive supplementation. Troponin negative and EKG without acute changes. Renal function stable with Cr 0.59. Liver function stable. Ddimer elevated at 927.09. Hgb dropping since admission from ~12 to ~10.   Past Medical History:  Diagnosis Date  . (HFpEF) heart  failure with preserved ejection fraction ([place])    a. 05/2015 Echo: EF 60-65%, no rwma, PASP 77mHg.  . Arthritis   . Asthma   . Bell's palsy   . Bronchitis   . CHF (congestive heart failure) ([place]   . COPD (chronic obstructive pulmonary disease) ([place]   . Diabetes mellitus without complication ([place]    type II 03/2017  . Gastric ulcer   . Hyperlipidemia   . Hypertension   . OSA (obstructive sleep apnea)    a. did not tolerate CPAP.  .Marland KitchenPancreatitis    07/2018  . Shoulder injury    6/19    Past Surgical History:  Procedure Laterality Date  . FLEXIBLE BRONCHOSCOPY N/A 06/20/2015   Procedure: FLEXIBLE BRONCHOSCOPY;  Surgeon: PLaverle Hobby MD;  Location: [facility] ORS;  Service: Pulmonary;  Laterality: N/A;  . KNEE SURGERY Left    8 knee surgeries     Home Medications:  Prior to Admission medications   Medication Sig Start Date End Date Taking? Authorizing Provider  albuterol (PROAIR HFA) 108 (90 Base) MCG/ACT inhaler Inhale 2 puffs into the lungs every 6 (six) hours as needed for wheezing or shortness of breath.    Yes [provider]  aspirin EC 81  MG EC tablet Take 1 tablet (81 mg total) by mouth daily. 06/23/15  Yes Demetrios Loll, MD  atorvastatin (LIPITOR) 80 MG tablet Take 1 tablet (80 mg total) by mouth daily. 05/04/18  Yes Gregor Hams, MD  Blood Glucose Monitoring Suppl (ACCU-CHEK AVIVA PLUS) w/Device KIT See admin instructions. 10/20/17  Yes [provider]  Cholecalciferol (VITAMIN D3) 2000 units capsule Take 1 capsule (2,000 Units total) by mouth daily. 02/27/18  Yes King, Diona Foley, NP  diltiazem (CARDIZEM) 30 MG tablet TAKE 1 TABLET(30 MG) BY MOUTH THREE TIMES DAILY AS NEEDED Patient taking differently: Take 30 mg by mouth 3 (three) times daily as needed.  08/16/18  Yes Gollan, Kathlene November, MD  diltiazem (CARTIA XT) 120 MG 24 hr capsule Take 1 capsule (120 mg total) by mouth daily. 05/04/18  Yes Gregor Hams, MD  DULoxetine (CYMBALTA) 30 MG capsule  Take 1 capsule (30 mg total) by mouth daily. Patient taking differently: Take 30 mg by mouth daily. Takes with 51m for 94m10/17/19  Yes BrGregor HamsMD  DULoxetine (CYMBALTA) 60 MG capsule Take 60 mg by mouth daily.    Yes [provider]  Fluticasone-Salmeterol (ADVAIR) 250-50 MCG/DOSE AEPB Inhale 1 puff into the lungs 2 (two) times daily.   Yes [provider]  furosemide (LASIX) 40 MG tablet Take 1 tablet (40 mg total) by mouth daily. Take an additional 40 mg in the am for weight gain as needed 05/04/18  Yes BrGregor HamsMD  metFORMIN (GLUCOPHAGE-XR) 500 MG 24 hr tablet Take 500 mg by mouth 2 (two) times daily. 07/13/18  Yes [provider]  oxyCODONE (OXY IR/ROXICODONE) 5 MG immediate release tablet Take 1 tablet (5 mg total) by mouth every 8 (eight) hours as needed for up to 30 days for severe pain. 08/29/18 09/28/18 Yes KiVevelyn FrancoisNP  OXYGEN Inhale 2 L into the lungs at bedtime.    Yes [provider]  pantoprazole (PROTONIX) 40 MG tablet Take 1 tablet (40 mg total) by mouth daily. 05/04/18  Yes BrGregor HamsMD  potassium chloride SA (K-DUR,KLOR-CON) 20 MEQ tablet Take 20 mEq by mouth 2 (two) times daily.   Yes [provider]  pregabalin (LYRICA) 75 MG capsule Take 1 capsule (75 mg total) by mouth 3 (three) times daily for 30 days. 08/29/18 09/28/18 Yes King, CrDiona FoleyNP  sucralfate (CARAFATE) 1 G tablet Take 1 tablet (1 g total) by mouth 4 (four) times daily -  with meals and at bedtime. Patient taking differently: Take 1 g by mouth 2 (two) times daily.  06/23/15  Yes ChDemetrios LollMD  tiotropium (SPIRIVA HANDIHALER) 18 MCG inhalation capsule Place 18 mcg into inhaler and inhale daily.   Yes [provider]  tiZANidine (ZANAFLEX) 4 MG tablet Take 1 tablet (4 mg total) by mouth 3 (three) times daily. 08/29/18  Yes KiVevelyn FrancoisNP  famotidine (PEPCID) 40 MG tablet Take 1 tablet (40 mg total) by mouth every evening.  09/10/18 09/10/19  VeAlfred LevinsCaKentuckyMD  metFORMIN (GLUCOPHAGE) 500 MG tablet Take 1 tablet (500 mg total) by mouth 2 (two) times daily with a meal. 05/04/18   BrGregor HamsMD    Inpatient Medications: Scheduled Meds: . aspirin  324 mg Oral Once  . aspirin EC  81 mg Oral Daily  . atorvastatin  80 mg Oral Daily  . DULoxetine  30 mg Oral Daily  . DULoxetine  60 mg Oral Daily  .  enoxaparin (LOVENOX) injection  40 mg Subcutaneous Q24H  . insulin aspart  0-5 Units Subcutaneous QHS  . insulin aspart  0-9 Units Subcutaneous TID WC  . metFORMIN  500 mg Oral BID WC  . potassium chloride SA  40 mEq Oral BID  . pregabalin  75 mg Oral TID  . sodium chloride flush  3 mL Intravenous Once  . sucralfate  1 g Oral BID  . tiotropium  18 mcg Inhalation Daily   Continuous Infusions:  PRN Meds: acetaminophen **OR** acetaminophen, diltiazem, morphine injection, oxyCODONE-acetaminophen  Allergies:    Allergies  Allergen Reactions  . Tramadol Other (See Comments) and Palpitations    Heart Skipping Beats Heart palpatations  . Penicillins Rash    Has patient had a PCN reaction causing immediate rash, facial/tongue/throat swelling, SOB or lightheadedness with hypotension: Yes Has patient had a PCN reaction causing severe rash involving mucus membranes or skin necrosis: No Has patient had a PCN reaction that required hospitalization: No Has patient had a PCN reaction occurring within the last 10 years: No If all of the above answers are "NO", then may proceed with Cephalosporin use.    Social History:   Social History   Socioeconomic History  . Marital status: Single    Spouse name: Not on file  . Number of children: Not on file  . Years of education: Not on file  . Highest education level: Not on file  Occupational History  . Occupation: unemployed  Social Needs  . Financial resource strain: Very hard  . Food insecurity:    Worry: Often true    Inability: Often true  .  Transportation needs:    Medical: Yes    Non-medical: Yes  Tobacco Use  . Smoking status: Former Smoker    Packs/day: 1.00    Years: 36.00    Pack years: 36.00    Types: Cigarettes    Last attempt to quit: 06/13/2015    Years since quitting: 3.2  . Smokeless tobacco: Never Used  Substance and Sexual Activity  . Alcohol use: No    Alcohol/week: 0.0 standard drinks    Frequency: Never    Comment: occasionaly  . Drug use: No  . Sexual activity: Not Currently  Lifestyle  . Physical activity:    Days per week: 5 days    Minutes per session: 20 min  . Stress: Very much  Relationships  . Social connections:    Talks on phone: Twice a week    Gets together: Never    Attends religious service: Never    Active member of club or organization: No    Attends meetings of clubs or organizations: Never    Relationship status: Divorced  . Intimate partner violence:    Fear of current or ex partner: No    Emotionally abused: No    Physically abused: No    Forced sexual activity: No  Other Topics Concern  . Not on file  Social History Narrative  . Not on file    Family History:    Family History  Problem Relation Age of Onset  . Lung cancer Father   . Breast cancer Mother        early 20's  . Diabetes Maternal Grandmother      ROS:  Please see the history of present illness.  Review of Systems  Constitutional: Positive for malaise/fatigue. Negative for diaphoresis.  HENT: Positive for congestion.   Respiratory: Positive for cough, sputum production and shortness of breath.  Negative for hemoptysis and wheezing.        Reported recently on abx and steroids for bronchitis  Cardiovascular: Positive for chest pain, palpitations, orthopnea and PND. Negative for claudication and leg swelling.       Sleeps in recliner. Becomes SOB sometimes at night with known OSA and asthma  CP improved from 7/10 yesterday AM to 3/10 currently. Pleuritic, non-radiating, constant pressure in  central chest and worse when laying down  Reports feeling palpitations     Gastrointestinal: Positive for heartburn, nausea and vomiting.       Last emesis 1/19. Nausea improved but previous nausea and emesis as of a few days prior and recent pancreatitis. Current bloating and gas. Chronic irregular bowel movements.   Genitourinary: Negative for dysuria and flank pain.  Musculoskeletal: Positive for back pain, falls and joint pain.       Reported h/o falls d/t ARB. No recent falls.  Neurological: Negative for dizziness, tingling, focal weakness, loss of consciousness and weakness.  Psychiatric/Behavioral: Positive for substance abuse.       Current alcohol and tobacco use  All other systems reviewed and are negative.   All other ROS reviewed and negative.     Physical Exam/Data:   Vitals:   09/10/18 1530 09/10/18 1828 09/10/18 2029 09/11/18 0541  BP: 132/70 128/61 136/66 (!) 120/58  Pulse: 92 97 91 85  Resp: 18 18 20 18   Temp:  99.1 F (37.3 C) 98.5 F (36.9 C) 98.5 F (36.9 C)  TempSrc:  Oral Oral Oral  SpO2: 96% 96% 96% 92%  Weight:      Height:        Intake/Output Summary (Last 24 hours) at 09/11/2018 1029 Last data filed at 09/11/2018 0900 Gross per 24 hour  Intake 340 ml  Output 600 ml  Net -260 ml   Filed Weights   09/10/18 0719  Weight: 87.5 kg   Body mass index is 29.34 kg/m.  General:  Well nourished, well developed, in no acute distress. Eating lunch. HEENT: normal. Not on oxygen Lymph: no adenopathy Neck: JVD difficult to assess d/t body habitus Endocrine:  No thryomegaly Vascular: No carotid bruits; FA pulses 2+ bilaterally without bruits  Cardiac:  normal S1, S2; RRR; no murmur  Lungs:  Coarse breath sounds bilaterally  Abd: soft, nontender, no hepatomegaly  Ext: trivial/minimal bilateral lower extremity edema Musculoskeletal:  No deformities, BUE and BLE strength normal and equal Skin: warm and dry  Neuro: no focal abnormalities noted Psych:   Normal affect   EKG:  The EKG was personally reviewed and demonstrates:  SR, no acute changes Telemetry:  Telemetry was personally reviewed and demonstrates:  SR, rates in 80s  Relevant CV Studies: Pending updated TTE  NM Study 10/21/2017  There was no ST segment deviation noted during stress.  The study is normal.  This is a low risk study.  The left ventricular ejection fraction is normal (55-65%).    Echo 05/2015  Left ventricle: The cavity size was normal. Systolic function was   normal. The estimated ejection fraction was in the range of 60%   to 65%. Wall motion was normal; there were no regional wall   motion abnormalities. Left ventricular diastolic function   parameters were normal. - Left atrium: The atrium was normal in size. - Right ventricle: Systolic function was normal. - Pulmonary arteries: Systolic pressure was moderately elevated. PA   peak pressure: 56 mm Hg (S). - Inferior vena cava: The vessel was dilated.  The respirophasic   diameter changes were blunted (< 50%), consistent with elevated   central venous pressure.  Laboratory Data:  Chemistry Recent Labs  Lab 09/10/18 0733 09/10/18 1120 09/11/18 0527  NA 135 136 137  K 2.3* 2.2* 3.1*  CL 90* 94* 96*  CO2 28 27 31   GLUCOSE 190* 146* 134*  BUN <5* <5* 7  CREATININE 0.62 0.59 0.54  CALCIUM 8.9 8.0* 8.2*  GFRNONAA >60 >60 >60  GFRAA >60 >60 >60  ANIONGAP 17* 15 10    Recent Labs  Lab 09/10/18 0733  PROT 7.0  ALBUMIN 3.5  AST 35  ALT 32  ALKPHOS 121  BILITOT 0.8   Hematology Recent Labs  Lab 09/10/18 0733 09/11/18 0527  WBC 10.5 7.7  RBC 3.64* 3.40*  HGB 11.2* 10.2*  HCT 34.6* 32.2*  MCV 95.1 94.7  MCH 30.8 30.0  MCHC 32.4 31.7  RDW 22.9* 23.3*  PLT 171 167   Cardiac Enzymes Recent Labs  Lab 09/10/18 0733 09/10/18 1120 09/10/18 2047  TROPONINI <0.03 <0.03 <0.03   No results for input(s): TROPIPOC in the last 168 hours.  BNPNo results for input(s): BNP, PROBNP in  the last 168 hours.  DDimer No results for input(s): DDIMER in the last 168 hours.  Radiology/Studies:  Dg Chest Portable 1 View  Result Date: 09/10/2018 CLINICAL DATA:  Chest pain. EXAM: PORTABLE CHEST 1 VIEW COMPARISON:  05/04/2018 FINDINGS: Cardiomediastinal silhouette is normal. Mediastinal contours appear intact. There is no evidence of focal airspace consolidation, pleural effusion or pneumothorax. Osseous structures are without acute abnormality. Soft tissues are grossly normal. IMPRESSION: No active disease. Electronically Signed   By: Fidela Salisbury M.D.   On: 09/10/2018 07:39    Assessment and Plan:   Atypical chest pain  - Atypical chest pain current but improved as above and with current GI symptoms and h/o recent pancreatitis. Patient does have documented history of intermittent chest discomfort (3-4 times weekly) in the setting of elevated blood pressure but no recent home BP checks and describes pain as different than elevated BP pain. - Troponin negative x3. EKG without acute changes as ST changes noted in the inferior leads but present on previous EKG. 10/2017 stress test ruled low risk. Significant cardiac risk factors including HLD, HTN, DM2, current alcohol and nicotine use with history of smoking since 54 years of age.  - Pending updated TTE as h/o OSA not on CPAP and most recent echo 2016 with EF estimated during 2019 stress. Ordered updated  A1c, and lipid panel to further risk stratify  -Continue ASA 81 mg daily. PTA medications also included diltiazem 30 mg tablet 3 times daily as needed and diltiazem 165m daily. Only PRN CCB continued at admission with TTE pending.  If reduced EF, recommend beta-blocker over calcium channel blocker. However, if EF preserved, recommend restart PTA diltiazem 1252mdaily. Continue statin with most recent labs showing stable liver function.  - Rules out for ACS at this time with negative troponin and EKG without acute changes in setting of  atypical chest pain and recent negative low risk stress test in 2019. Given significant risk factors for cardiac ischemia and pending updated labs to further risk stratify, could consider repeat stress test this admission or if echo shows significant changes. Consider also workup for PE with ddimer elevated at 927.09 (no recent travel reported, no current SOB, not tachycardic) . If further reported CP, could consider also consulting GI with symptoms of GI distress / GERD and recent  pancreatitis.   HFpEF  - Pending TTE with previous echo showing preserved EF but elevated PASP and not on CPAP with known OSA therefore will need to assess RV function as well. PTA lasix 14m daily not continued at admission given hypokalemia. Recommend PTA lasix restart with further potassium increase and after recheck of BMET. Continue current KCl supplementation given hypokalemia with goal 4.0. Considering HFpEF, HTN, DM2, recommend retrying ACE/ARB if possible during admission or as outpatient.  Hypokalemia  - Replete as above. Likely in setting of emesis, diarrhea d/t recent pancreatitis - Daily BMET  Anemia - Hgb 12.6  11.2  10.2. Continue to monitor and recommend further workup per IM if further drop and as reporting GI symptoms. Recommend transfusion with Hgb lower than 8.0. Daily CBC.  HLD -Pending updated lipid panel - 2016 LDL 113 -PTA medications include atorvastatin 80 mg daily.  Continue with most recent liver function stable.   HTN - Currently controlled.  DM2 - Recommendation for retry of ACE/ARB as renal function stable and prior to discharge per guideline directed therapy - SSI  OSA, asthma - Breathing treatments, per IM - Discussed need for CPAP and latest advancements in CPAP masks   Nicotine and alcohol abuse - Cessation advised  For questions or updates, please contact [facility] Please consult [email or web address] for contact info under     Signed, JArvil Chaco PA-C    09/11/2018 10:29 AM

## 2018-09-11 NOTE — Progress Notes (Signed)
*  PRELIMINARY RESULTS* Echocardiogram 2D Echocardiogram has been performed.  Erica [name] 09/11/2018, 3:26 PM

## 2018-09-12 ENCOUNTER — Other Ambulatory Visit: Payer: Self-pay | Admitting: Nurse Practitioner

## 2018-09-12 LAB — BASIC METABOLIC PANEL
Anion gap: 4 — ABNORMAL LOW (ref 5–15)
BUN: 7 mg/dL (ref 6–20)
CO2: 30 mmol/L (ref 22–32)
Calcium: 8.6 mg/dL — ABNORMAL LOW (ref 8.9–10.3)
Chloride: 104 mmol/L (ref 98–111)
Creatinine, Ser: 0.56 mg/dL (ref 0.44–1.00)
GFR calc Af Amer: >60 mL/min (ref >60)
GFR calc non Af Amer: >60 mL/min (ref >60)
Glucose, Bld: 119 mg/dL — ABNORMAL HIGH (ref 70–99)
Potassium: 3.8 mmol/L (ref 3.5–5.1)
Sodium: 138 mmol/L (ref 135–145)

## 2018-09-12 LAB — HIV ANTIBODY (ROUTINE TESTING W REFLEX): HIV Screen 4th Generation wRfx: NONREACTIVE

## 2018-09-12 LAB — GLUCOSE, CAPILLARY
Glucose-Capillary: 121 mg/dL — ABNORMAL HIGH (ref 70–99)
Glucose-Capillary: 189 mg/dL — ABNORMAL HIGH (ref 70–99)

## 2018-09-12 LAB — PHOSPHORUS: Phosphorus: 1.1 mg/dL — ABNORMAL LOW (ref 2.5–4.6)

## 2018-09-12 LAB — MAGNESIUM: Magnesium: 2 mg/dL (ref 1.7–2.4)

## 2018-09-12 MED ORDER — SODIUM PHOSPHATES 45 MMOLE/15ML IV SOLN
15.0000 mmol | Freq: Once | INTRAVENOUS | Status: AC
  Administered 2018-09-12: 15 mmol via INTRAVENOUS
  Filled 2018-09-12: qty 5

## 2018-09-12 MED ORDER — K PHOS MONO-SOD PHOS DI & MONO 155-852-130 MG PO TABS
500.0000 mg | ORAL_TABLET | Freq: Two times a day (BID) | ORAL | Status: DC
  Administered 2018-09-12: 500 mg via ORAL
  Filled 2018-09-12 (×2): qty 2

## 2018-09-12 MED ORDER — POTASSIUM CHLORIDE CRYS ER 20 MEQ PO TBCR
40.0000 meq | EXTENDED_RELEASE_TABLET | Freq: Two times a day (BID) | ORAL | Status: DC
  Administered 2018-09-12: 40 meq via ORAL
  Filled 2018-09-12: qty 2

## 2018-09-12 MED ORDER — K PHOS MONO-SOD PHOS DI & MONO 155-852-130 MG PO TABS: 500.0000 mg | ORAL_TABLET | Freq: Two times a day (BID) | ORAL | 0 refills | Status: DC

## 2018-09-12 MED ORDER — FAMOTIDINE 40 MG PO TABS: 40.0000 mg | ORAL_TABLET | Freq: Every evening | ORAL | 0 refills | Status: DC

## 2018-09-12 NOTE — Progress Notes (Signed)
Discharge order received. Patient is alert and oriented. Vital signs stable . No signs of acute distress. Discharge instructions given. Patient verbalized understanding. No other issues noted at this time.   

## 2018-09-12 NOTE — Progress Notes (Signed)
[employer] - [place] at [facility]                                                                                                                                                                                  Patient Demographics   Erica [name], is a 53 y.o. female, DOB - Jun 08, 1966, ZOX:[id number]  Admit date - 09/10/2018   Admitting Physician Houston Siren, MD  Outpatient Primary MD for the patient is Hillery Aldo, MD   LOS - 2  Subjective: No further chest pain    Review of Systems:   CONSTITUTIONAL: No documented fever. No fatigue, weakness. No weight gain, no weight loss.  EYES: No blurry or double vision.  ENT: No tinnitus. No postnasal drip. No redness of the oropharynx.  RESPIRATORY: No cough, no wheeze, no hemoptysis. No dyspnea.  CARDIOVASCULAR: Positive chest pain. No orthopnea. No palpitations. No syncope.  GASTROINTESTINAL: No nausea, no vomiting or diarrhea. No abdominal pain. No melena or hematochezia.  GENITOURINARY: No dysuria or hematuria.  ENDOCRINE: No polyuria or nocturia. No heat or cold intolerance.  HEMATOLOGY: No anemia. No bruising. No bleeding.  INTEGUMENTARY: No rashes. No lesions.  MUSCULOSKELETAL: No arthritis. No swelling. No gout.  NEUROLOGIC: No numbness, tingling, or ataxia. No seizure-type activity.  PSYCHIATRIC: No anxiety. No insomnia. No ADD.    Vitals:   Vitals:   09/11/18 0541 09/11/18 2039 09/12/18 0414 09/12/18 1132  BP: (!) 120/58 135/70 132/69 125/68  Pulse: 85 80 84 86  Resp: Temp: 98.5 F (36.9 C) 98.7 F (37.1 C) 98.2 F (36.8 C) 97.6 F (36.4 C)  TempSrc: Oral Oral Oral Oral  SpO2: 92% 96% 93% 95%  Weight:      Height:        Wt Readings from Last 3 Encounters:  09/10/18 87.5 kg  08/29/18 87.5 kg  08/12/18 90.7 kg     Intake/Output Summary (Last 24 hours) at 09/12/2018 1252 Last data filed at 09/12/2018 0900 Gross per 24 hour  Intake 1680 ml  Output 2600 ml  Net -920 ml     Physical Exam:   GENERAL: Pleasant-appearing in no apparent distress.  HEAD, EYES, EARS, NOSE AND THROAT: Atraumatic, normocephalic. Extraocular muscles are intact. Pupils equal and reactive to light. Sclerae anicteric. No conjunctival injection. No oro-pharyngeal erythema.  NECK: Supple. There is no jugular venous distention. No bruits, no lymphadenopathy, no thyromegaly.  HEART: Regular rate and rhythm,. No murmurs, no rubs, no clicks.  LUNGS: Clear to auscultation bilaterally. No rales or rhonchi. No wheezes.  ABDOMEN: Soft, flat, nontender, nondistended. Has good bowel sounds. No hepatosplenomegaly appreciated.  EXTREMITIES: No evidence of any  cyanosis, clubbing, or peripheral edema.  +2 pedal and radial pulses bilaterally.  NEUROLOGIC: The patient is alert, awake, and oriented x3 with no focal motor or sensory deficits appreciated bilaterally.  SKIN: Moist and warm with no rashes appreciated.  Psych: Not anxious, depressed LN: No inguinal LN enlargement    Antibiotics   Anti-infectives (From admission, onward)   None      Medications   Scheduled Meds: . aspirin  324 mg Oral Once  . aspirin EC  81 mg Oral Daily  . atorvastatin  80 mg Oral Daily  . DULoxetine  30 mg Oral Daily  . DULoxetine  60 mg Oral Daily  . enoxaparin (LOVENOX) injection  40 mg Subcutaneous Q24H  . insulin aspart  0-5 Units Subcutaneous QHS  . insulin aspart  0-9 Units Subcutaneous TID WC  . metFORMIN  500 mg Oral BID WC  . phosphorus  500 mg Oral BID  . potassium chloride  40 mEq Oral BID  . pregabalin  75 mg Oral TID  . sodium chloride flush  3 mL Intravenous Once  . sucralfate  1 g Oral BID  . tiotropium  18 mcg Inhalation Daily   Continuous Infusions: . sodium chloride 250 mL (09/11/18 1210)  . sodium phosphate  Dextrose 5% IVPB 15 mmol (09/12/18 0930)   PRN Meds:.sodium chloride, acetaminophen **OR** acetaminophen, diltiazem, morphine injection, oxyCODONE-acetaminophen   Data Review:    Micro Results No results found for this or any previous visit (from the past 240 hour(s)).  Radiology Reports Ct Angio Chest Pe W Or Wo Contrast  Result Date: 09/11/2018 CLINICAL DATA:  Chest pain. EXAM: CT ANGIOGRAPHY CHEST WITH CONTRAST TECHNIQUE: Multidetector CT imaging of the chest was performed using the standard protocol during bolus administration of intravenous contrast. Multiplanar CT image reconstructions and MIPs were obtained to evaluate the vascular anatomy. CONTRAST:  65mL OMNIPAQUE IOHEXOL 350 MG/ML SOLN COMPARISON:  Radiographs of September 10, 2018. CT scan of April 27, 2016. FINDINGS: Cardiovascular: Satisfactory opacification of the pulmonary arteries to the segmental level. No evidence of pulmonary embolism. Normal heart size. No pericardial effusion. Atherosclerosis of thoracic aorta is noted without aneurysm or dissection. Mediastinum/Nodes: No enlarged mediastinal, hilar, or axillary lymph nodes. Thyroid gland, trachea, and esophagus demonstrate no significant findings. Lungs/Pleura: Stable 5 mm subpleural nodule is seen in left lower lobe; this can be considered benign at this point with no further follow-up required. No pneumothorax or pleural effusion is noted. Mild bilateral posterior basilar subsegmental atelectasis is noted. Right lung is unremarkable. Upper Abdomen: No acute abnormality. Musculoskeletal: No chest wall abnormality. No acute or significant osseous findings. Review of the MIP images confirms the above findings. IMPRESSION: No definite evidence of pulmonary embolus. Aortic Atherosclerosis (ICD10-I70.0). Electronically Signed   By: Lupita Raider, M.D.   On: 09/11/2018 14:25   Dg Chest Portable 1 View  Result Date: 09/10/2018 CLINICAL DATA:  Chest pain. EXAM: PORTABLE CHEST 1 VIEW COMPARISON:  05/04/2018 FINDINGS: Cardiomediastinal silhouette is normal. Mediastinal contours appear intact. There is no evidence of focal airspace consolidation, pleural effusion  or pneumothorax. Osseous structures are without acute abnormality. Soft tissues are grossly normal. IMPRESSION: No active disease. Electronically Signed   By: Ted Mcalpine M.D.   On: 09/10/2018 07:39     CBC Recent Labs  Lab 09/10/18 0733 09/11/18 0527  WBC 10.5 7.7  HGB 11.2* 10.2*  HCT 34.6* 32.2*  PLT 171 167  MCV 95.1 94.7  MCH 30.8 30.0  MCHC 32.4 31.7  RDW 22.9* 23.3*  LYMPHSABS 2.3  --   MONOABS 1.6*  --   EOSABS 0.3  --   BASOSABS 0.2*  --     Chemistries  Recent Labs  Lab 09/10/18 0733 09/10/18 1120 09/11/18 0527 09/12/18 0422  NA 135 136 137 138  K 2.3* 2.2* 3.1* 3.8  CL 90* 94* 96* 104  CO2 GLUCOSE 190* 146* 134* 119*  BUN <5* <5* 7 7  CREATININE 0.62 0.59 0.54 0.56  CALCIUM 8.9 8.0* 8.2* 8.6*  MG  --  1.3* 2.4 2.0  AST 35  --   --   --   ALT 32  --   --   --   ALKPHOS 121  --   --   --   BILITOT 0.8  --   --   --    ------------------------------------------------------------------------------------------------------------------ estimated creatinine clearance is 93 mL/min (by C-G formula based on SCr of 0.56 mg/dL). ------------------------------------------------------------------------------------------------------------------ Recent Labs    09/11/18 0527  HGBA1C 6.0*   ------------------------------------------------------------------------------------------------------------------ Recent Labs    09/11/18 0527  CHOL 145  HDL 42  LDLCALC 70  TRIG 163*  CHOLHDL 3.5   ------------------------------------------------------------------------------------------------------------------ No results for input(s): TSH, T4TOTAL, T3FREE, THYROIDAB in the last 72 hours.  Invalid input(s): FREET3 ------------------------------------------------------------------------------------------------------------------ No results for input(s): VITAMINB12, FOLATE, FERRITIN, TIBC, IRON, RETICCTPCT in the last 72 hours.  Coagulation  profile No results for input(s): INR, PROTIME in the last 168 hours.  No results for input(s): DDIMER in the last 72 hours.  Cardiac Enzymes Recent Labs  Lab 09/10/18 0733 09/10/18 1120 09/10/18 2047  TROPONINI <0.03 <0.03 <0.03   ------------------------------------------------------------------------------------------------------------------ Invalid input(s): POCBNP    Assessment & Plan   River Mckercher  is a 53 y.o. female with a known history of alcohol abuse, tobacco abuse, history of pancreatitis, diabetes, COPD, history of congestive heart failure, osteoarthritis who presents to the hospital complaining of some chest pain and incidentally noted to be severely hypokalemic.   1.  Acute hypokalemia- replaced K+, PO4 replaced  2.  Prolonged QT interval-secondary to the severe hypokalemia. - Continue to monitor  3.  Chest pain-atypical and likely led to reflux.    Continues to complain of chest pain has seen cardiology as outpatient I have asked them to evaluate the patient to check a d-dimer  4.  Diabetes type 2 without complication- continue metformin, sliding scale insulin.  5.  COPD without exacerbation-continue Spiriva.  .  6.  Neuropathy-continue Lyrica.  7.  History of chronic diastolic CHF clinically patient is not in congestive heart failure.    Resume Lasix on discharge  8.  Depression-continue Cymbalta.  9.  Hyperlipidemia-continue atorvastatin.  10.  Chronic pain-continue oxycodone as needed.     Code Status Orders  (From admission, onward)         Start     Ordered   09/10/18 1648  Full code  Continuous     09/10/18 1647        Code Status History    Date Active Date Inactive Code Status Order ID Comments User Context   06/14/2015 0201 06/23/2015 1824 Full Code [id number]  Ihor Austin, MD Inpatient    Advance Directive Documentation     Most Recent Value  Type of Advance Directive  Living will  Pre-existing out of facility DNR  order (yellow form or pink MOST form)  -  "MOST" Form in Place?  -  Consults cardiology  DVT Prophylaxis  Lovenox   Lab Results  Component Value Date   PLT 167 09/11/2018     Time Spent in minutes   35 minutes greater than 50% of time spent in care coordination and counseling patient regarding the condition and plan of care.   Auburn Bilberry M.D on 09/12/2018 at 12:52 PM  Between 7am to 6pm - Pager - [phone]  After 6pm go to [email or web address] - Social research officer, government  [employer]   Office  [phone]

## 2018-09-12 NOTE — Consult Note (Signed)
PHARMACY CONSULT NOTE - FOLLOW UP  Pharmacy Consult for Electrolyte Monitoring and Replacement   Recent Labs: Potassium (mmol/L)  Date Value  09/12/2018 3.8  02/26/2014 3.7   Magnesium (mg/dL)  Date Value  00/93/8182 2.0   Calcium (mg/dL)  Date Value  99/37/1696 8.6 (L)   Calcium, Total (mg/dL)  Date Value  78/93/8101 8.3 (L)   Albumin (g/dL)  Date Value  75/04/2584 3.5  03/10/2013 3.6   Phosphorus (mg/dL)  Date Value  27/78/2423 1.1 (L)   Sodium (mmol/L)  Date Value  09/12/2018 138  04/22/2016 140  02/26/2014 139     Assessment: 53 y.o. female with a h/o alcohol abuse, tobacco abuse,  pancreatitis, diabetes, COPD, CHF, OA who presents to the hospital complaining of some chest pain and incidentally noted to be severely hypokalemic.   2/23: She received 50 mEq IV KCl and 80 mEq oral KCl: potassium improved from 2.2 to 3.1 mmol/L  2/24: She received 40 mEq IV KCl: potassium improved to 3.8. However, her phosphate is low and will need to be replaced   Goal of Therapy:  Electrolytes wnl  Plan:  We will give sodium phosphate 15 mmol IV. Cardiology has recommended a goal potassium of 4 mmol/L, therefore we will add oral KCl 40 mEq BID x 2. Labs are ordered for tomorrow morning and pharmacy will replace as needed.  Lowella Bandy ,PharmD Clinical Pharmacist 09/12/2018 7:00 AM

## 2018-09-12 NOTE — Discharge Summary (Signed)
[place] at [facility], 53 y.o., DOB 17-Oct-1965, MRN [id number]. Admission date: 09/10/2018 Discharge Date 09/12/2018 Primary MD Denton Lank, MD Admitting Physician Henreitta Leber, MD  Admission Diagnosis  Hypokalemia [J94.1] Alcoholic ketoacidosis [D40.8] Prolonged Q-T interval on ECG [X44.81] Acute alcoholic gastritis without hemorrhage [K29.20] Chest pain, unspecified type [R07.9]  Discharge Diagnosis   Active Problems:  Acute hypokalemia Prolonged QT interval  Chest pain COPD without exasperation Neuropathy chronic diastolic CHF  depression Hyperlipidemia chronic pain  Hospital Course  DonnaButleris a10 y.o.femalewith a known history of alcohol abuse, tobacco abuse, history of pancreatitis, diabetes, COPD, history of congestive heart failure, osteoarthritis who presents to the hospital complaining of some chest pain and incidentally noted to be severely hypokalemic.  Patient was admitted and given IV potassium and oral potassium.  Her potassium was replaced.  Her phosphorus was also low which is being replaced.  Patient had chest pains underwent a CT scan due to elevated d-dimer which was negative.  Cardiac enzymes were negative her echo was okay seen by cardiology they felt that it was atypical chest pain.   Patient now doing well without any complaints       Consults  cardiology  Significant Tests:  See full reports for all details     Ct Angio Chest Pe W Or Wo Contrast  Result Date: 09/11/2018 CLINICAL DATA:  Chest pain. EXAM: CT ANGIOGRAPHY CHEST WITH CONTRAST TECHNIQUE: Multidetector CT imaging of the chest was performed using the standard protocol during bolus administration of intravenous contrast. Multiplanar CT image reconstructions and MIPs were obtained to evaluate the vascular anatomy. CONTRAST:  29m OMNIPAQUE IOHEXOL 350 MG/ML SOLN COMPARISON:  Radiographs of September 10, 2018. CT scan of April 27, 2016.  FINDINGS: Cardiovascular: Satisfactory opacification of the pulmonary arteries to the segmental level. No evidence of pulmonary embolism. Normal heart size. No pericardial effusion. Atherosclerosis of thoracic aorta is noted without aneurysm or dissection. Mediastinum/Nodes: No enlarged mediastinal, hilar, or axillary lymph nodes. Thyroid gland, trachea, and esophagus demonstrate no significant findings. Lungs/Pleura: Stable 5 mm subpleural nodule is seen in left lower lobe; this can be considered benign at this point with no further follow-up required. No pneumothorax or pleural effusion is noted. Mild bilateral posterior basilar subsegmental atelectasis is noted. Right lung is unremarkable. Upper Abdomen: No acute abnormality. Musculoskeletal: No chest wall abnormality. No acute or significant osseous findings. Review of the MIP images confirms the above findings. IMPRESSION: No definite evidence of pulmonary embolus. Aortic Atherosclerosis (ICD10-I70.0). Electronically Signed   By: JMarijo Conception M.D.   On: 09/11/2018 14:25   Dg Chest Portable 1 View  Result Date: 09/10/2018 CLINICAL DATA:  Chest pain. EXAM: PORTABLE CHEST 1 VIEW COMPARISON:  05/04/2018 FINDINGS: Cardiomediastinal silhouette is normal. Mediastinal contours appear intact. There is no evidence of focal airspace consolidation, pleural effusion or pneumothorax. Osseous structures are without acute abnormality. Soft tissues are grossly normal. IMPRESSION: No active disease. Electronically Signed   By: DFidela SalisburyM.D.   On: 09/10/2018 07:39       Today   Subjective:   DShirely Toren Pt doing better no chest pain  Objective:   Blood pressure 125/68, pulse 86, temperature 97.6 F (36.4 C), temperature source Oral, resp. rate 16, height '5\' 8"'$  (1.727 m), weight 87.5 kg, last menstrual period 03/13/2015, SpO2 95 %.  .  Intake/Output Summary (Last 24 hours) at 09/12/2018 1259 Last data filed at 09/12/2018 0900 Gross per 24 hour  Intake 1680 ml  Output 2600 ml  Net -920 ml    Exam VITAL SIGNS: Blood pressure 125/68, pulse 86, temperature 97.6 F (36.4 C), temperature source Oral, resp. rate 16, height _0  (1.727 m), weight 87.5 kg, last menstrual period 03/13/2015, SpO2 95 %.  GENERAL:  53 y.o.-year-old patient lying in the bed with no acute distress.  EYES: Pupils equal, round, reactive to light and accommodation. No scleral icterus. Extraocular muscles intact.  HEENT: Head atraumatic, normocephalic. Oropharynx and nasopharynx clear.  NECK:  Supple, no jugular venous distention. No thyroid enlargement, no tenderness.  LUNGS: Normal breath sounds bilaterally, no wheezing, rales,rhonchi or crepitation. No use of accessory muscles of respiration.  CARDIOVASCULAR: S1, S2 normal. No murmurs, rubs, or gallops.  ABDOMEN: Soft, nontender, nondistended. Bowel sounds present. No organomegaly or mass.  EXTREMITIES: No pedal edema, cyanosis, or clubbing.  NEUROLOGIC: Cranial nerves II through XII are intact. Muscle strength 5/5 in all extremities. Sensation intact. Gait not checked.  PSYCHIATRIC: The patient is alert and oriented x 3.  SKIN: No obvious rash, lesion, or ulcer.   Data Review     CBC w Diff:  Lab Results  Component Value Date   WBC 7.7 09/11/2018   HGB 10.2 (L) 09/11/2018   HGB 14.9 02/26/2014   HCT 32.2 (L) 09/11/2018   HCT 45.5 02/26/2014   PLT 167 09/11/2018   PLT 281 02/26/2014   LYMPHOPCT 22 09/10/2018   MONOPCT 16 09/10/2018   EOSPCT 3 09/10/2018   BASOPCT 2 09/10/2018   CMP:  Lab Results  Component Value Date   NA 138 09/12/2018   NA 140 04/22/2016   NA 139 02/26/2014   K 3.8 09/12/2018   K 3.7 02/26/2014   CL 104 09/12/2018   CL 104 02/26/2014   CO2 30 09/12/2018   CO2 27 02/26/2014   BUN 7 09/12/2018   BUN 10 04/22/2016   BUN 4 (L) 02/26/2014   CREATININE 0.56 09/12/2018   CREATININE 0.74 02/26/2014   PROT 7.0 09/10/2018   PROT 7.6 03/10/2013   ALBUMIN 3.5 09/10/2018    ALBUMIN 3.6 03/10/2013   BILITOT 0.8 09/10/2018   BILITOT 0.7 03/10/2013   ALKPHOS 121 09/10/2018   ALKPHOS 89 03/10/2013   AST 35 09/10/2018   AST 57 (H) 03/10/2013   ALT 32 09/10/2018   ALT 57 03/10/2013  .  Micro Results No results found for this or any previous visit (from the past 240 hour(s)).      Code Status Orders  (From admission, onward)         Start     Ordered   09/10/18 1648  Full code  Continuous     09/10/18 1647        Code Status History    Date Active Date Inactive Code Status Order ID Comments User Context   06/14/2015 0201 06/23/2015 1824 Full Code [id number]  Saundra Shelling, MD Inpatient    Advance Directive Documentation     Most Recent Value  Type of Advance Directive  Living will  Pre-existing out of facility DNR order (yellow form or pink MOST form)  -  "MOST" Form in Place?  -          Follow-up Information    Denton Lank, MD. Go on 10/05/2018.   Specialty:  Family Medicine Why:  _1 :40pm, Clinic will call if they are able to get an earlier appointment Contact information: [address] [place] [address] [phone]  Discharge Medications   Allergies as of 09/12/2018      Reactions   Tramadol Other (See Comments), Palpitations   Heart Skipping Beats Heart palpatations   Penicillins Rash   Has patient had a PCN reaction causing immediate rash, facial/tongue/throat swelling, SOB or lightheadedness with hypotension: Yes Has patient had a PCN reaction causing severe rash involving mucus membranes or skin necrosis: No Has patient had a PCN reaction that required hospitalization: No Has patient had a PCN reaction occurring within the last 10 years: No If all of the above answers are "NO", then may proceed with Cephalosporin use.      Medication List    STOP taking these medications   potassium chloride SA 20 MEQ tablet Commonly known as:  K-DUR,KLOR-CON     TAKE these medications    ACCU-CHEK AVIVA PLUS w/Device Kit See admin instructions.   aspirin 81 MG EC tablet Take 1 tablet (81 mg total) by mouth daily.   atorvastatin 80 MG tablet Commonly known as:  LIPITOR Take 1 tablet (80 mg total) by mouth daily.   diltiazem 120 MG 24 hr capsule Commonly known as:  CARTIA XT Take 1 capsule (120 mg total) by mouth daily.   diltiazem 30 MG tablet Commonly known as:  CARDIZEM TAKE 1 TABLET(30 MG) BY MOUTH THREE TIMES DAILY AS NEEDED What changed:  See the new instructions.   DULoxetine 60 MG capsule Commonly known as:  CYMBALTA Take 60 mg by mouth daily. What changed:  Another medication with the same name was changed. Make sure you understand how and when to take each.   DULoxetine 30 MG capsule Commonly known as:  CYMBALTA Take 1 capsule (30 mg total) by mouth daily. What changed:  additional instructions   famotidine 40 MG tablet Commonly known as:  PEPCID Take 1 tablet (40 mg total) by mouth every evening for 30 days.   Fluticasone-Salmeterol 250-50 MCG/DOSE Aepb Commonly known as:  ADVAIR Inhale 1 puff into the lungs 2 (two) times daily.   furosemide 40 MG tablet Commonly known as:  LASIX Take 1 tablet (40 mg total) by mouth daily. Take an additional 40 mg in the am for weight gain as needed   metFORMIN 500 MG tablet Commonly known as:  GLUCOPHAGE Take 1 tablet (500 mg total) by mouth 2 (two) times daily with a meal.   metFORMIN 500 MG 24 hr tablet Commonly known as:  GLUCOPHAGE-XR Take 500 mg by mouth 2 (two) times daily.   oxyCODONE 5 MG immediate release tablet Commonly known as:  Oxy IR/ROXICODONE Take 1 tablet (5 mg total) by mouth every 8 (eight) hours as needed for up to 30 days for severe pain.   OXYGEN Inhale 2 L into the lungs at bedtime.   pantoprazole 40 MG tablet Commonly known as:  PROTONIX Take 1 tablet (40 mg total) by mouth daily.   phosphorus 155-852-130 MG tablet Commonly known as:  K PHOS NEUTRAL Take 2 tablets (500  mg total) by mouth 2 (two) times daily.   pregabalin 75 MG capsule Commonly known as:  LYRICA Take 1 capsule (75 mg total) by mouth 3 (three) times daily for 30 days.   PROAIR HFA 108 (90 Base) MCG/ACT inhaler Generic drug:  albuterol Inhale 2 puffs into the lungs every 6 (six) hours as needed for wheezing or shortness of breath.   SPIRIVA HANDIHALER 18 MCG inhalation capsule Generic drug:  tiotropium Place 18 mcg into inhaler and inhale daily.   sucralfate 1  g tablet Commonly known as:  CARAFATE Take 1 tablet (1 g total) by mouth 4 (four) times daily -  with meals and at bedtime. What changed:  when to take this   tiZANidine 4 MG tablet Commonly known as:  ZANAFLEX Take 1 tablet (4 mg total) by mouth 3 (three) times daily.   Vitamin D3 50 MCG (2000 UT) capsule Take 1 capsule (2,000 Units total) by mouth daily.          Total Time in preparing paper work, data evaluation and todays exam - 69 minutes  Dustin Flock M.D on 09/12/2018 at 12:59 PM [place]  [phone]

## 2018-09-13 ENCOUNTER — Telehealth: Payer: Self-pay | Admitting: Nurse Practitioner

## 2018-09-13 NOTE — Telephone Encounter (Signed)
Pt called and stated that pharmacy still doesn't have new RX for tizanidine. I read previous note to pt where it was stated that a new rx was sent over. She still states they do not have this rx.

## 2018-09-14 ENCOUNTER — Other Ambulatory Visit: Payer: Self-pay | Admitting: Nurse Practitioner

## 2018-09-14 DIAGNOSIS — M62830 Muscle spasm of back: Secondary | ICD-10-CM

## 2018-09-14 DIAGNOSIS — R252 Cramp and spasm: Secondary | ICD-10-CM

## 2018-09-14 DIAGNOSIS — M1732 Unilateral post-traumatic osteoarthritis, left knee: Secondary | ICD-10-CM

## 2018-09-14 MED ORDER — TIZANIDINE HCL 4 MG PO CAPS: 4.0000 mg | ORAL_CAPSULE | Freq: Three times a day (TID) | ORAL | 1 refills | Status: DC | PRN

## 2018-09-14 NOTE — Telephone Encounter (Signed)
Patient notified per voicemail that script for Tizanidine has been sent to pharmacy. 

## 2018-09-14 NOTE — Telephone Encounter (Signed)
I spoke with pharmacist, they did receive the script, and patient has picked it up. Pharmacy questioning that the script was written for #30 tabs, with instructions to take tid. Was this the intention, or should the script have been for more tabs?

## 2018-09-14 NOTE — Telephone Encounter (Signed)
New Rx sent for 90

## 2018-09-19 ENCOUNTER — Ambulatory Visit: Payer: Medicaid Other | Admitting: Pain Medicine

## 2018-09-19 DIAGNOSIS — M51379 Other intervertebral disc degeneration, lumbosacral region without mention of lumbar back pain or lower extremity pain: Secondary | ICD-10-CM | POA: Insufficient documentation

## 2018-09-19 DIAGNOSIS — G8918 Other acute postprocedural pain: Secondary | ICD-10-CM | POA: Insufficient documentation

## 2018-09-19 DIAGNOSIS — M47816 Spondylosis without myelopathy or radiculopathy, lumbar region: Secondary | ICD-10-CM | POA: Insufficient documentation

## 2018-09-19 DIAGNOSIS — M5137 Other intervertebral disc degeneration, lumbosacral region: Secondary | ICD-10-CM | POA: Insufficient documentation

## 2018-09-19 NOTE — Progress Notes (Deleted)
Patient's Name: Erica [name]  MRN: [id number]  Referring Provider: Barbette Merino, NP  DOB: 03/02/1966  PCP: Hillery Aldo, MD  DOS: 09/19/2018  Note by: Oswaldo Done, MD  Service setting: Ambulatory outpatient  Specialty: Interventional Pain Management  Patient type: Established  Location: [facility] (AMB) Pain Management Facility  Visit type: Interventional Procedure   Primary Reason for Visit: Interventional Pain Management Treatment. CC: No chief complaint on file.  Procedure:          Anesthesia, Analgesia, Anxiolysis:  Type: Thermal Lumbar Facet, Medial Branch Radiofrequency Ablation/Neurotomy  #2 (last done on 09/29/2016) Primary Purpose: Therapeutic Region: Posterolateral Lumbosacral Spine Level: L2, L3, L4, L5, & S1 Medial Branch Level(s). These levels will denervate the L3-4, L4-5, and the L5-S1 lumbar facet joints. Laterality: Left  Type: Moderate (Conscious) Sedation combined with Local Anesthesia Indication(s): Analgesia and Anxiety Route: Intravenous (IV) IV Access: Secured Sedation: Meaningful verbal contact was maintained at all times during the procedure  Local Anesthetic: Lidocaine 1-2%  Position: Prone   Indications: 1. Spondylosis without myelopathy or radiculopathy, lumbosacral region   2. Lumbar facet syndrome (Bilateral) (R>L)   3. Lumbar spondylosis   4. DDD (degenerative disc disease), lumbosacral   5. Osteoarthritis of facet joint of lumbar spine   6. Chronic low back pain (Primary Source of Pain) (Bilateral) (L>R)    Erica [name] has been dealing with the above chronic pain for longer than three months and has either failed to respond, was unable to tolerate, or simply did not get enough benefit from other more conservative therapies including, but not limited to: 1. Over-the-counter medications 2. Anti-inflammatory medications 3. Muscle relaxants 4. Membrane stabilizers 5. Opioids 6. Physical therapy and/or chiropractic manipulation 7. Modalities (Heat,  ice, etc.) 8. Invasive techniques such as nerve blocks. Erica [name] has attained more than 50% relief of the pain from a series of diagnostic injections conducted in separate occasions.  Pain Score: Pre-procedure:  /10 Post-procedure:  /10  Pre-op Assessment:  Erica [name] is a 53 y.o. (year old), female patient, seen today for interventional treatment. She  has a past surgical history that includes Knee surgery (Left) and Flexible bronchoscopy (N/A, 06/20/2015). Erica [name] has a current medication list which includes the following prescription(s): albuterol, aspirin, atorvastatin, accu-chek aviva plus, vitamin d3, diltiazem, diltiazem, duloxetine, duloxetine, famotidine, fluticasone-salmeterol, furosemide, metformin, metformin, oxycodone, oxygen-helium, pantoprazole, phosphorus, pregabalin, sucralfate, tiotropium, and tizanidine. Her primarily concern today is the No chief complaint on file.  Initial Vital Signs:  Pulse/HCG Rate:    Temp:   Resp:   BP:   SpO2:    BMI: Estimated body mass index is 29.34 kg/m as calculated from the following:   Height as of 09/10/18: 5\' 8"  (1.727 m).   Weight as of 09/10/18: 192 lb 15.9 oz (87.5 kg).  Risk Assessment: Allergies: Reviewed. She is allergic to tramadol and penicillins.  Allergy Precautions: None required Coagulopathies: Reviewed. None identified.  Blood-thinner therapy: None at this time Active Infection(s): Reviewed. None identified. Erica [name] is afebrile  Site Confirmation: Erica [name] was asked to confirm the procedure and laterality before marking the site Procedure checklist: Completed Consent: Before the procedure and under the influence of no sedative(s), amnesic(s), or anxiolytics, the patient was informed of the treatment options, risks and possible complications. To fulfill our ethical and legal obligations, as recommended by the American Medical Association's Code of Ethics, I have informed the patient of my clinical impression; the  nature and purpose of the treatment or procedure;  the risks, benefits, and possible complications of the intervention; the alternatives, including doing nothing; the risk(s) and benefit(s) of the alternative treatment(s) or procedure(s); and the risk(s) and benefit(s) of doing nothing. The patient was provided information about the general risks and possible complications associated with the procedure. These may include, but are not limited to: failure to achieve desired goals, infection, bleeding, organ or nerve damage, allergic reactions, paralysis, and death. In addition, the patient was informed of those risks and complications associated to Spine-related procedures, such as failure to decrease pain; infection (i.e.: Meningitis, epidural or intraspinal abscess); bleeding (i.e.: epidural hematoma, subarachnoid hemorrhage, or any other type of intraspinal or peri-dural bleeding); organ or nerve damage (i.e.: Any type of peripheral nerve, nerve root, or spinal cord injury) with subsequent damage to sensory, motor, and/or autonomic systems, resulting in permanent pain, numbness, and/or weakness of one or several areas of the body; allergic reactions; (i.e.: anaphylactic reaction); and/or death. Furthermore, the patient was informed of those risks and complications associated with the medications. These include, but are not limited to: allergic reactions (i.e.: anaphylactic or anaphylactoid reaction(s)); adrenal axis suppression; blood sugar elevation that in diabetics may result in ketoacidosis or comma; water retention that in patients with history of congestive heart failure may result in shortness of breath, pulmonary edema, and decompensation with resultant heart failure; weight gain; swelling or edema; medication-induced neural toxicity; particulate matter embolism and blood vessel occlusion with resultant organ, and/or nervous system infarction; and/or aseptic necrosis of one or more joints. Finally, the  patient was informed that Medicine is not an exact science; therefore, there is also the possibility of unforeseen or unpredictable risks and/or possible complications that may result in a catastrophic outcome. The patient indicated having understood very clearly. We have given the patient no guarantees and we have made no promises. Enough time was given to the patient to ask questions, all of which were answered to the patient's satisfaction. Erica [name] has indicated that she wanted to continue with the procedure. Attestation: I, the ordering provider, attest that I have discussed with the patient the benefits, risks, side-effects, alternatives, likelihood of achieving goals, and potential problems during recovery for the procedure that I have provided informed consent. Date  Time: {CHL [facility]-PAIN TIME CHOICES:21018001}  Pre-Procedure Preparation:  Monitoring: As per clinic protocol. Respiration, ETCO2, SpO2, BP, heart rate and rhythm monitor placed and checked for adequate function Safety Precautions: Patient was assessed for positional comfort and pressure points before starting the procedure. Time-out: I initiated and conducted the "Time-out" before starting the procedure, as per protocol. The patient was asked to participate by confirming the accuracy of the "Time Out" information. Verification of the correct person, site, and procedure were performed and confirmed by me, the nursing staff, and the patient. "Time-out" conducted as per Joint Commission's Universal Protocol (UP.01.01.01). Time:    Description of Procedure:          Laterality: Left Levels:  L2, L3, L4, L5, & S1 Medial Branch Level(s), at the L3-4, L4-5, and the L5-S1 lumbar facet joints. Area Prepped: Lumbosacral Prepping solution: ChloraPrep (2% chlorhexidine gluconate and 70% isopropyl alcohol) Safety Precautions: Aspiration looking for blood return was conducted prior to all injections. At no point did we inject any substances,  as a needle was being advanced. Before injecting, the patient was told to immediately notify me if she was experiencing any new onset of "ringing in the ears, or metallic taste in the mouth". No attempts were made at seeking any  paresthesias. Safe injection practices and needle disposal techniques used. Medications properly checked for expiration dates. SDV (single dose vial) medications used. After the completion of the procedure, all disposable equipment used was discarded in the proper designated medical waste containers. Local Anesthesia: Protocol guidelines were followed. The patient was positioned over the fluoroscopy table. The area was prepped in the usual manner. The time-out was completed. The target area was identified using fluoroscopy. A 12-in long, straight, sterile hemostat was used with fluoroscopic guidance to locate the targets for each level blocked. Once located, the skin was marked with an approved surgical skin marker. Once all sites were marked, the skin (epidermis, dermis, and hypodermis), as well as deeper tissues (fat, connective tissue and muscle) were infiltrated with a small amount of a short-acting local anesthetic, loaded on a 10cc syringe with a 25G, 1.5-in  Needle. An appropriate amount of time was allowed for local anesthetics to take effect before proceeding to the next step. Local Anesthetic: Lidocaine 2.0% The unused portion of the local anesthetic was discarded in the proper designated containers. Technical explanation of process:  Radiofrequency Ablation (RFA) L2 Medial Branch Nerve RFA: The target area for the L2 medial branch is at the junction of the postero-lateral aspect of the superior articular process and the superior, posterior, and medial edge of the transverse process of L3. Under fluoroscopic guidance, a Radiofrequency needle was inserted until contact was made with os over the superior postero-lateral aspect of the pedicular shadow (target area). Sensory and  motor testing was conducted to properly adjust the position of the needle. Once satisfactory placement of the needle was achieved, the numbing solution was slowly injected after negative aspiration for blood. 2.0 mL of the nerve block solution was injected without difficulty or complication. After waiting for at least 3 minutes, the ablation was performed. Once completed, the needle was removed intact. L3 Medial Branch Nerve RFA: The target area for the L3 medial branch is at the junction of the postero-lateral aspect of the superior articular process and the superior, posterior, and medial edge of the transverse process of L4. Under fluoroscopic guidance, a Radiofrequency needle was inserted until contact was made with os over the superior postero-lateral aspect of the pedicular shadow (target area). Sensory and motor testing was conducted to properly adjust the position of the needle. Once satisfactory placement of the needle was achieved, the numbing solution was slowly injected after negative aspiration for blood. 2.0 mL of the nerve block solution was injected without difficulty or complication. After waiting for at least 3 minutes, the ablation was performed. Once completed, the needle was removed intact. L4 Medial Branch Nerve RFA: The target area for the L4 medial branch is at the junction of the postero-lateral aspect of the superior articular process and the superior, posterior, and medial edge of the transverse process of L5. Under fluoroscopic guidance, a Radiofrequency needle was inserted until contact was made with os over the superior postero-lateral aspect of the pedicular shadow (target area). Sensory and motor testing was conducted to properly adjust the position of the needle. Once satisfactory placement of the needle was achieved, the numbing solution was slowly injected after negative aspiration for blood. 2.0 mL of the nerve block solution was injected without difficulty or complication.  After waiting for at least 3 minutes, the ablation was performed. Once completed, the needle was removed intact. L5 Medial Branch Nerve RFA: The target area for the L5 medial branch is at the junction of the postero-lateral aspect of  the superior articular process of S1 and the superior, posterior, and medial edge of the sacral ala. Under fluoroscopic guidance, a Radiofrequency needle was inserted until contact was made with os over the superior postero-lateral aspect of the pedicular shadow (target area). Sensory and motor testing was conducted to properly adjust the position of the needle. Once satisfactory placement of the needle was achieved, the numbing solution was slowly injected after negative aspiration for blood. 2.0 mL of the nerve block solution was injected without difficulty or complication. After waiting for at least 3 minutes, the ablation was performed. Once completed, the needle was removed intact. S1 Medial Branch Nerve RFA: The target area for the S1 medial branch is located inferior to the junction of the S1 superior articular process and the L5 inferior articular process, posterior, inferior, and lateral to the 6 o'clock position of the L5-S1 facet joint, just superior to the S1 posterior foramen. Under fluoroscopic guidance, the Radiofrequency needle was advanced until contact was made with os over the Target area. Sensory and motor testing was conducted to properly adjust the position of the needle. Once satisfactory placement of the needle was achieved, the numbing solution was slowly injected after negative aspiration for blood. 2.0 mL of the nerve block solution was injected without difficulty or complication. After waiting for at least 3 minutes, the ablation was performed. Once completed, the needle was removed intact. Radiofrequency lesioning (ablation):  Radiofrequency Generator: NeuroTherm [id number] Sensory Stimulation Parameters: 50 Hz was used to locate & identify the nerve,  making sure that the needle was positioned such that there was no sensory stimulation below 0.3 V or above 0.7 V. Motor Stimulation Parameters: 2 Hz was used to evaluate the motor component. Care was taken not to lesion any nerves that demonstrated motor stimulation of the lower extremities at an output of less than 2.5 times that of the sensory threshold, or a maximum of 2.0 V. Lesioning Technique Parameters: Standard Radiofrequency settings. (Not bipolar or pulsed.) Temperature Settings: 80 degrees C Lesioning time: 60 seconds Intra-operative Compliance: Compliant Materials & Medications: Needle(s) (Electrode/Cannula) Type: Teflon-coated, curved tip, Radiofrequency needle(s) Gauge: 22G Length: 10cm Numbing solution: 0.2% PF-Ropivacaine + Triamcinolone (40 mg/mL) diluted to a final concentration of 4 mg of Triamcinolone/mL of Ropivacaine The unused portion of the solution was discarded in the proper designated containers.  Once the entire procedure was completed, the treated area was cleaned, making sure to leave some of the prepping solution back to take advantage of its long term bactericidal properties.  Illustration of the posterior view of the lumbar spine and the posterior neural structures. Laminae of L2 through S1 are labeled. DPRL5, dorsal primary ramus of L5; DPRS1, dorsal primary ramus of S1; DPR3, dorsal primary ramus of L3; FJ, facet (zygapophyseal) joint L3-L4; I, inferior articular process of L4; LB1, lateral branch of dorsal primary ramus of L1; IAB, inferior articular branches from L3 medial branch (supplies L4-L5 facet joint); IBP, intermediate branch plexus; MB3, medial branch of dorsal primary ramus of L3; NR3, third lumbar nerve root; S, superior articular process of L5; SAB, superior articular branches from L4 (supplies L4-5 facet joint also); TP3, transverse process of L3.  There were no vitals filed for this visit.  Start Time:   hrs. End Time:   hrs.  Imaging Guidance  (Spinal):          Type of Imaging Technique: Fluoroscopy Guidance (Spinal) Indication(s): Assistance in needle guidance and placement for procedures requiring needle placement in or near specific  anatomical locations not easily accessible without such assistance. Exposure Time: Please see nurses notes. Contrast: None used. Fluoroscopic Guidance: I was personally present during the use of fluoroscopy. "Tunnel Vision Technique" used to obtain the best possible view of the target area. Parallax error corrected before commencing the procedure. "Direction-depth-direction" technique used to introduce the needle under continuous pulsed fluoroscopy. Once target was reached, antero-posterior, oblique, and lateral fluoroscopic projection used confirm needle placement in all planes. Images permanently stored in EMR. Interpretation: No contrast injected. I personally interpreted the imaging intraoperatively. Adequate needle placement confirmed in multiple planes. Permanent images saved into the patient's record.  Antibiotic Prophylaxis:   Anti-infectives (From admission, onward)   None     Indication(s): None identified  Post-operative Assessment:  Post-procedure Vital Signs:  Pulse/HCG Rate:    Temp:   Resp:   BP:   SpO2:    EBL: None  Complications: No immediate post-treatment complications observed by team, or reported by patient.  Note: The patient tolerated the entire procedure well. A repeat set of vitals were taken after the procedure and the patient was kept under observation following institutional policy, for this type of procedure. Post-procedural neurological assessment was performed, showing return to baseline, prior to discharge. The patient was provided with post-procedure discharge instructions, including a section on how to identify potential problems. Should any problems arise concerning this procedure, the patient was given instructions to immediately contact us, at any time,  without hesitation. In any case, we plan to contact the patient by telephone for a follow-up status report regarding this interventional procedure.  Comments:  No additional relevant information.  Plan of Care   Imaging Orders  No imaging studies ordered today   Procedure Orders    No procedure(s) ordered today    Medications ordered for procedure: No orders of the defined types were placed in this encounter.  Medications administered: Erica [name] had no medications administered during this visit.  See the medical record for exact dosing, route, and time of administration.  Disposition: Discharge home  Discharge Date & Time: 09/19/2018;   hrs.   Physician-requested Follow-up: No follow-ups on file.  Future Appointments  Date Time Provider Department Center  09/19/2018 10:30 AM Delano Metz, MD [facility] None  09/25/2018  2:40 PM Antonieta Iba, MD [facility]  09/27/2018  1:45 PM Barbette Merino, NP [facility] None   Primary Care Physician: Hillery Aldo, MD Location: [facility] Note by: Oswaldo Done, MD Date: 09/19/2018; Time: 7:38 AM  Disclaimer:  Medicine is not an Visual merchandiser. The only guarantee in medicine is that nothing is guaranteed. It is important to note that the decision to proceed with this intervention was based on the information collected from the patient. The Data and conclusions were drawn from the patient's questionnaire, the interview, and the physical examination. Because the information was provided in large part by the patient, it cannot be guaranteed that it has not been purposely or unconsciously manipulated. Every effort has been made to obtain as much relevant data as possible for this evaluation. It is important to note that the conclusions that lead to this procedure are derived in large part from the available data. Always take into account that the treatment will also be dependent on availability of  resources and existing treatment guidelines, considered by other Pain Management Practitioners as being common knowledge and practice, at the time of the intervention. For Medico-Legal purposes, it is also important to point out  that variation in procedural techniques and pharmacological choices are the acceptable norm. The indications, contraindications, technique, and results of the above procedure should only be interpreted and judged by a Board-Certified Interventional Pain Specialist with extensive familiarity and expertise in the same exact procedure and technique.

## 2018-09-20 ENCOUNTER — Encounter: Payer: Medicaid Other | Admitting: Nurse Practitioner

## 2018-09-24 NOTE — Progress Notes (Incomplete)
NO SHOW

## 2018-09-25 ENCOUNTER — Ambulatory Visit: Payer: Self-pay | Admitting: Cardiovascular Disease

## 2018-09-26 ENCOUNTER — Encounter: Payer: Self-pay | Admitting: Cardiovascular Disease

## 2018-09-27 ENCOUNTER — Encounter: Payer: Medicaid Other | Admitting: Nurse Practitioner

## 2018-10-09 ENCOUNTER — Other Ambulatory Visit: Payer: Self-pay | Admitting: Nurse Practitioner

## 2018-10-09 DIAGNOSIS — E559 Vitamin D deficiency, unspecified: Secondary | ICD-10-CM

## 2018-10-11 ENCOUNTER — Encounter: Payer: Self-pay | Admitting: Nurse Practitioner

## 2018-10-11 ENCOUNTER — Ambulatory Visit: Payer: Medicaid Other | Attending: Nurse Practitioner | Admitting: Nurse Practitioner

## 2018-10-11 ENCOUNTER — Other Ambulatory Visit: Payer: Self-pay

## 2018-10-11 VITALS — BP 139/60 | HR 95 | Temp 98.4°F | Resp 16 | Ht 68.0 in | Wt 200.0 lb

## 2018-10-11 DIAGNOSIS — G609 Hereditary and idiopathic neuropathy, unspecified: Secondary | ICD-10-CM | POA: Diagnosis present

## 2018-10-11 DIAGNOSIS — M1732 Unilateral post-traumatic osteoarthritis, left knee: Secondary | ICD-10-CM | POA: Diagnosis present

## 2018-10-11 DIAGNOSIS — R252 Cramp and spasm: Secondary | ICD-10-CM | POA: Diagnosis not present

## 2018-10-11 DIAGNOSIS — G894 Chronic pain syndrome: Secondary | ICD-10-CM | POA: Diagnosis present

## 2018-10-11 DIAGNOSIS — M62830 Muscle spasm of back: Secondary | ICD-10-CM

## 2018-10-11 DIAGNOSIS — M160 Bilateral primary osteoarthritis of hip: Secondary | ICD-10-CM | POA: Diagnosis present

## 2018-10-11 DIAGNOSIS — M792 Neuralgia and neuritis, unspecified: Secondary | ICD-10-CM | POA: Diagnosis present

## 2018-10-11 DIAGNOSIS — M47816 Spondylosis without myelopathy or radiculopathy, lumbar region: Secondary | ICD-10-CM | POA: Insufficient documentation

## 2018-10-11 MED ORDER — OXYCODONE HCL 5 MG PO TABS: 5.0000 mg | ORAL_TABLET | Freq: Three times a day (TID) | ORAL | 0 refills | Status: DC | PRN

## 2018-10-11 MED ORDER — TIZANIDINE HCL 4 MG PO CAPS: 4.0000 mg | ORAL_CAPSULE | Freq: Three times a day (TID) | ORAL | 2 refills | Status: DC | PRN

## 2018-10-11 MED ORDER — PREGABALIN 75 MG PO CAPS: 75.0000 mg | ORAL_CAPSULE | Freq: Three times a day (TID) | ORAL | 2 refills | Status: DC

## 2018-10-11 NOTE — Progress Notes (Signed)
Nursing Pain Medication Assessment:  Safety precautions to be maintained throughout the outpatient stay will include: orient to surroundings, keep bed in low position, maintain call bell within reach at all times, provide assistance with transfer out of bed and ambulation.  Medication Inspection Compliance: Pill count conducted under aseptic conditions, in front of the patient. Neither the pills nor the bottle was removed from the patient's sight at any time. Once count was completed pills were immediately returned to the patient in their original bottle.  Medication: Oxycodone IR Pill/Patch Count: 0 of 90 pills remain Pill/Patch Appearance: Markings consistent with prescribed medication Bottle Appearance: Standard pharmacy container. Clearly labeled. Filled Date: 02 / 11 / 2020 Last Medication intake:  Ran out of medicine more than 48 hours ago

## 2018-10-11 NOTE — Progress Notes (Signed)
Patient's Name: Erica [name]  MRN: [id number]  Referring Provider: Denton Lank, MD  DOB: 1966-03-29  PCP: Denton Lank, MD  DOS: 10/11/2018  Note by: Dionisio David, NP  Service setting: Ambulatory outpatient  Specialty: Interventional Pain Management  Location: [facility] (AMB) Pain Management Facility    Patient type: Established   HPI  Reason for Visit: Erica [name] is a 53 y.o. year old, female patient, who comes today with a chief complaint of Back Pain (bilateral lumbar); Hip Pain (left); and Knee Pain (bilateral ) Last Appointment: Her last appointment at our practice was on 10/09/2018. I last saw her on 10/09/2018.  Pain Assessment: Today, Erica [name] describes the severity of the Chronic pain as a 5 /10. She indicates the location/referral of the pain to be Back Lower, Left, Right/back pain into left hip and down the back of the leg . Onset was: More than a month ago. The quality of pain is described as Discomfort, Aching, Nagging, Numbness. Temporal description, or timing of pain is: Constant. Possible modifying factors: medications and rest, heat. Erica [name]  height is 5' 8"  (1.727 m) and weight is 200 lb (90.7 kg). Her oral temperature is 98.4 F (36.9 C). Her blood pressure is 139/60 and her pulse is 95. Her respiration is 16 and oxygen saturation is 97%.  She has some numbness and tingling in her left leg. She has "giving in her knees". She does not feel that her legs are weak "per say" Controlled Substance Pharmacotherapy Assessment REMS (Risk Evaluation and Mitigation Strategy)  Analgesic:Oxycodone IR 5 mg 1 tablet by mouth every 8 hours (54m/dayof oxycodone) MME/day:22.515mday.  PaJanett BillowRN  10/11/2018 11:32 AM  Sign when Signing Visit Nursing Pain Medication Assessment:  Safety precautions to be maintained throughout the outpatient stay will include: orient to surroundings, keep bed in low position, maintain call bell within reach at all times, provide assistance  with transfer out of bed and ambulation.  Medication Inspection Compliance: Pill count conducted under aseptic conditions, in front of the patient. Neither the pills nor the bottle was removed from the patient's sight at any time. Once count was completed pills were immediately returned to the patient in their original bottle.  Medication: Oxycodone IR Pill/Patch Count: 0 of 90 pills remain Pill/Patch Appearance: Markings consistent with prescribed medication Bottle Appearance: Standard pharmacy container. Clearly labeled. Filled Date: 02 / 11 / 2020 Last Medication intake:  Ran out of medicine more than 48 hours ago   Pharmacokinetics: Liberation and absorption (onset of action): WNL Distribution (time to peak effect): WNL Metabolism and excretion (duration of action): WNL         Pharmacodynamics: Desired effects: Analgesia: Erica [name] >50% benefit. Functional ability: Patient reports that medication allows her to accomplish basic ADLs Clinically meaningful improvement in function (CMIF): Sustained CMIF goals met Perceived effectiveness: Described as relatively effective, allowing for increase in activities of daily living (ADL) Undesirable effects: Side-effects or Adverse reactions: None reported Monitoring: [place] PMP: Online review of the past 1237-monthriod conducted. Compliant with practice rules and regulations Last UDS on record: Summary  Date Value Ref Range Status  08/29/2018 FINAL  Final    Comment:    ==================================================================== TOXASSURE SELSELECT 47W) ==================================================================== Test                             Result       Flag  Units   NO DRUGS DETECTED. ==================================================================== Test                      Result    Flag   Units      Ref Range   Creatinine              167              mg/dL       >=20 ==================================================================== Declared Medications:  The flagging and interpretation on this report are based on the  following declared medications.  Unexpected results may arise from  inaccuracies in the declared medications.  **Note: The testing scope of this panel does not include following  reported medications:  Albuterol  Aspirin  Atorvastatin  Calcium  Cholecalciferol  Diltiazem  Duloxetine  Fluconazole  Fluticasone  Folic acid ==================================================================== For clinical consultation, please call [phone]. ====================================================================    UDS interpretation: Compliant          Medication Assessment Form: Reviewed. Patient indicates being compliant with therapy Treatment compliance: Compliant Risk Assessment Profile: Aberrant behavior: See initial evaluations. None observed or detected today Comorbid factors increasing risk of overdose: See initial evaluation. No additional risks detected today Opioid risk tool (ORT):  Opioid Risk  08/29/2018  Alcohol 0  Illegal Drugs 0  Rx Drugs 0  Alcohol 0  Illegal Drugs 0  Rx Drugs 0  Age between 16-45 years  0  History of Preadolescent Sexual Abuse 0  Psychological Disease 2  ADD Negative  OCD Negative  Bipolar Negative  Depression 1  Opioid Risk Tool Scoring 3  Opioid Risk Interpretation Low Risk    ORT Scoring interpretation table:  Score <3 = Low Risk for SUD  Score between 4-7 = Moderate Risk for SUD  Score >8 = High Risk for Opioid Abuse   Risk of substance use disorder (SUD): Low-to-Moderate  Risk Mitigation Strategies:  Patient Counseling: Covered Patient-Prescriber Agreement (PPA): Present and active  Notification to other healthcare providers: Done  Pharmacologic Plan: No change in therapy, at this time.             ROS  Constitutional: Denies any fever or  chills Gastrointestinal: No reported hemesis, hematochezia, vomiting, or acute GI distress Musculoskeletal: Denies any acute onset joint swelling, redness, loss of ROM, or weakness Neurological: No reported episodes of acute onset apraxia, aphasia, dysarthria, agnosia, amnesia, paralysis, loss of coordination, or loss of consciousness  Medication Review  Accu-Chek Aviva Plus, DULoxetine, Fluticasone-Salmeterol, Oxygen-Helium, Vitamin D3, albuterol, aspirin, atorvastatin, diltiazem, famotidine, furosemide, metFORMIN, oxyCODONE, pantoprazole, phosphorus, pregabalin, sucralfate, tiZANidine, and tiotropium  History Review  Allergy: Ms. Howk is allergic to tramadol and penicillins. Drug: Ms. Winiarski  reports no history of drug use. Alcohol:  reports no history of alcohol use. Tobacco:  reports that she quit smoking about 3 years ago. Her smoking use included cigarettes. She has a 36.00 pack-year smoking history. She has never used smokeless tobacco. Social: Ms. Blowe  reports that she quit smoking about 3 years ago. Her smoking use included cigarettes. She has a 36.00 pack-year smoking history. She has never used smokeless tobacco. She reports that she does not drink alcohol or use drugs. Medical:  has a past medical history of (HFpEF) heart failure with preserved ejection fraction ([place]), Arthritis, Asthma, Bell's palsy, Bronchitis, CHF (congestive heart failure) ([place]), COPD (chronic obstructive pulmonary disease) ([place]), Diabetes mellitus without complication ([place]), Gastric ulcer, Hyperlipidemia, Hypertension, OSA (obstructive sleep apnea), Pancreatitis, and Shoulder  injury. Surgical: Ms. Kinn  has a past surgical history that includes Knee surgery (Left) and Flexible bronchoscopy (N/A, 06/20/2015). Family: family history includes Breast cancer in her mother; Diabetes in her maternal grandmother; Lung cancer in her father. Problem List: Ms. Holten has Chronic low back pain (Primary Source of Pain)  (Bilateral) (L>R); Chronic knee pain [facility] source of pain) (Bilateral) (L>R); Chronic neck pain (Bilateral) (R>L); Chronic upper back pain (midline); Chronic foot pain (bottom of feet) (Bilateral) (R>L); Chronic hand pain (Bilateral); Peripheral neuropathy, idiopathic (upper and lower extremity); Chronic sacroiliac joint pain (Bilateral) (R>L); Lumbar facet syndrome (Bilateral) (R>L); Chronic pain syndrome; Neurogenic pain; Lumbar spondylosis; Osteoarthritis of knee (Bilateral) (L>R); Intermittent left thoracic Muscle cramps; Spasm of thoracolumbar muscle (Left); and Post-traumatic osteoarthritis of knee (Left) on their pertinent problem list.  Lab Review  Kidney Function Lab Results  Component Value Date   BUN 7 09/12/2018   CREATININE 0.56 09/12/2018   BCR 15 04/22/2016   GFRAA >60 09/12/2018   GFRNONAA >60 09/12/2018  Liver Function Lab Results  Component Value Date   AST 35 09/10/2018   ALT 32 09/10/2018   ALBUMIN 3.5 09/10/2018  Note: Above Lab results reviewed.  Imaging Review  ECHOCARDIOGRAM COMPLETE   ECHOCARDIOGRAM REPORT       Patient Name:   TAYVIA FAUGHNAN Date of Exam: 09/11/2018 Medical Rec #:  [id number]      Height:       68.0 in Accession #:    [id number]     Weight:       193.0 lb Date of Birth:  [birth year]      BSA:          2.01 m Patient Age:    62 years       BP:           120/58 mmHg Patient Gender: F              HR:           85 bpm. Exam Location:  [facility]    Procedure: 2D Echo, Cardiac Doppler and Color Doppler  Indications:     Chest pain 786.50   History:         Patient has prior history of Echocardiogram examinations. CHF,                  COPD; Risk Factors: Hypertension and Diabetes. Bronchitis, HLD,                  heart failure with preserved ejection fraction, OSA.   Sonographer:     Sherrie Sport RDCS (AE) Referring Phys:  [id number] Arvil [place] Diagnosing Phys: Kathlyn Sacramento MD    Sonographer Comments: Suboptimal apical  window. IMPRESSIONS   1. The left ventricle has normal systolic function, with an ejection fraction of 55-60%. The cavity size was normal. There is mild concentric left ventricular hypertrophy. Left ventricular diastolic Doppler parameters are consistent with impaired  relaxation.  2. The right ventricle has normal systolic function. The cavity was normal. There is no increase in right ventricular wall thickness.  3. The mitral valve is normal in structure.  4. The tricuspid valve is normal in structure.  5. The aortic valve is tricuspid.  6. The pulmonic valve was normal in structure.  FINDINGS  Left Ventricle: The left ventricle has normal systolic function, with an ejection fraction of 55-60%. The cavity size was normal. There is mild concentric left ventricular hypertrophy. Left ventricular diastolic Doppler  parameters are consistent with  impaired relaxation Right Ventricle: The right ventricle has normal systolic function. The cavity was normal. There is no increase in right ventricular wall thickness. Left Atrium: left atrial size was normal in size Right Atrium: right atrial size was normal in size. Right atrial pressure is estimated at 10 mmHg. Interatrial Septum: No atrial level shunt detected by color flow Doppler. Pericardium: There is no evidence of pericardial effusion. Mitral Valve: The mitral valve is normal in structure. Mitral valve regurgitation is not visualized by color flow Doppler. Tricuspid Valve: The tricuspid valve is normal in structure. Tricuspid valve regurgitation was not visualized by color flow Doppler. Aortic Valve: The aortic valve is tricuspid Aortic valve regurgitation was not visualized by color flow Doppler. Pulmonic Valve: The pulmonic valve was normal in structure. Pulmonic valve regurgitation is not visualized by color flow Doppler. Venous: The inferior vena cava was not well visualized.   LEFT VENTRICLE PLAX 2D (Teich) LV EF:          64.3 %    Diastology LVIDd:          4.12 cm  LV e' lateral:   8.05 cm/s LVIDs:          2.69 cm  LV E/e' lateral: 11.3 LV PW:          1.47 cm  LV e' medial:    7.94 cm/s LV IVS:         1.13 cm  LV E/e' medial:  11.5 LVOT diam:      2.00 cm LV SV:          48 ml LVOT Area:      3.14 cm  RIGHT VENTRICLE RVSP:           26.2 mmHg  LEFT ATRIUM             Index       RIGHT ATRIUM           Index LA diam:        2.60 cm 1.29 cm/m  RA Pressure: 10 mmHg LA Vol (A2C):   55.5 ml 27.57 ml/m RA Area:     15.50 cm LA Vol (A4C):   42.7 ml 21.21 ml/m RA Volume:   39.40 ml  19.57 ml/m LA Biplane Vol: 50.5 ml 25.09 ml/m  AORTIC VALVE                   PULMONIC VALVE AV Area (Vmax):    2.10 cm    PV Vmax:        0.44 m/s AV Area (Vmean):   2.09 cm    PV Peak grad:   0.8 mmHg AV Area (VTI):     2.76 cm    RVOT Peak grad: 2 mmHg AV Vmax:           138.50 cm/s AV Vmean:          93.150 cm/s AV VTI:            0.260 m AV Peak Grad:      7.7 mmHg AV Mean Grad:      4.0 mmHg LVOT Vmax:         92.40 cm/s LVOT Vmean:        62.100 cm/s LVOT VTI:          0.229 m LVOT/AV VTI ratio: 0.88   AORTA Ao Root diam: 2.50 cm  MITRAL VALVE  TRICUSPID VALVE MV Area (PHT): 2.62 cm    TR Peak grad:   16.2 mmHg MV PHT:        83.81 msec  TR Vmax:        201.00 cm/s MV Decel Time: 289 msec    RVSP:           26.2 mmHg MV E velocity: 91.20 cm/s MV A velocity: 121.00 cm/s MV E/A ratio:  0.75    Kathlyn Sacramento MD Electronically signed by Kathlyn Sacramento MD Signature Date/Time: 09/11/2018/3:43:12 PM      Final   CT ANGIO CHEST PE W OR WO CONTRAST CLINICAL DATA:  Chest pain.  EXAM: CT ANGIOGRAPHY CHEST WITH CONTRAST  TECHNIQUE: Multidetector CT imaging of the chest was performed using the standard protocol during bolus administration of intravenous contrast. Multiplanar CT image reconstructions and MIPs were obtained to evaluate the vascular anatomy.  CONTRAST:  26m OMNIPAQUE IOHEXOL  350 MG/ML SOLN  COMPARISON:  Radiographs of September 10, 2018. CT scan of April 27, 2016.  FINDINGS: Cardiovascular: Satisfactory opacification of the pulmonary arteries to the segmental level. No evidence of pulmonary embolism. Normal heart size. No pericardial effusion. Atherosclerosis of thoracic aorta is noted without aneurysm or dissection.  Mediastinum/Nodes: No enlarged mediastinal, hilar, or axillary lymph nodes. Thyroid gland, trachea, and esophagus demonstrate no significant findings.  Lungs/Pleura: Stable 5 mm subpleural nodule is seen in left lower lobe; this can be considered benign at this point with no further follow-up required. No pneumothorax or pleural effusion is noted. Mild bilateral posterior basilar subsegmental atelectasis is noted. Right lung is unremarkable.  Upper Abdomen: No acute abnormality.  Musculoskeletal: No chest wall abnormality. No acute or significant osseous findings.  Review of the MIP images confirms the above findings.  IMPRESSION: No definite evidence of pulmonary embolus.  Aortic Atherosclerosis (ICD10-I70.0).  Electronically Signed   By: JMarijo Conception M.D.   On: 09/11/2018 14:25 Note: Reviewed        Physical Exam  General appearance: Well nourished, well developed, and well hydrated. In no apparent acute distress Mental status: Alert, oriented x 3 (person, place, & time)       Respiratory: No evidence of acute respiratory distress Eyes: PERLA Vitals: BP 139/60 (BP Location: Right Arm, Patient Position: Sitting, Cuff Size: Normal)   Pulse 95   Temp 98.4 F (36.9 C) (Oral)   Resp 16   Ht 5' 8"  (1.727 m)   Wt 200 lb (90.7 kg)   LMP 03/13/2015 (Approximate) Comment: more than 2 years ago  SpO2 97%   BMI 30.41 kg/m  BMI: Estimated body mass index is 30.41 kg/m as calculated from the following:   Height as of this encounter: 5' 8"  (1.727 m).   Weight as of this encounter: 200 lb (90.7 kg). Ideal: Ideal body weight:  63.9 kg (140 lb 14 oz) Adjusted ideal body weight: 74.6 kg (164 lb 8.4 oz) Lumbar Spine Area Exam  Skin & Axial Inspection: No masses, redness, or swelling Alignment: Symmetrical Functional ROM: Unrestricted ROM       Stability: No instability detected Muscle Tone/Strength: Functionally intact. No obvious neuro-muscular anomalies detected. Sensory (Neurological): Unimpaired Palpation: Complains of area being tender to palpation       Provocative Tests: Hyperextension/rotation test: Positive bilaterally for facet joint pain. Lumbar quadrant test (Kemp's test): deferred today       Lateral bending test: deferred today       Patrick's Maneuver: deferred today  Gait & Posture Assessment  Ambulation: Unassisted Gait: Relatively normal for age and body habitus Posture: WNL  Lower Extremity Exam    Side: Right lower extremity  Side: Left lower extremity  Stability: No instability observed          Stability: No instability observed          Skin & Extremity Inspection: Skin color, temperature, and hair growth are WNL. No peripheral edema or cyanosis. No masses, redness, swelling, asymmetry, or associated skin lesions. No contractures.  Skin & Extremity Inspection: Skin color, temperature, and hair growth are WNL. No peripheral edema or cyanosis. No masses, redness, swelling, asymmetry, or associated skin lesions. No contractures.  Functional ROM: Adequate ROM for knee joint          Functional ROM: Adequate ROM for knee joint          Muscle Tone/Strength: Functionally intact. No obvious neuro-muscular anomalies detected.  Muscle Tone/Strength: Functionally intact. No obvious neuro-muscular anomalies detected.  Sensory (Neurological): Referred pain pattern        Sensory (Neurological): Referred pain pattern             Assessment   Status Diagnosis  Persistent Persistent Controlled 1. Lumbar spondylosis   2. Post-traumatic osteoarthritis of knee (Left)   3.  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 Intermittent left thoracic Muscle cramps   5. Spasm of thoracolumbar muscle (Left)   6. Chronic pain syndrome   7. Peripheral neuropathy, idiopathic (upper and lower extremity)   8. Neurogenic pain      Updated Problems: No problems updated.  Plan of Care  Pharmacotherapy (Medications Ordered): Meds ordered this encounter  Medications  . tiZANidine (ZANAFLEX) 4 MG capsule    Sig: Take 1 capsule (4 mg total) by mouth 3 (three) times daily as needed for muscle spasms.    Dispense:  90 capsule    Refill:  2    Please discontinue the previous Rx for #30 no refill    Order Specific Question:   Supervising Provider    Answer:   Milinda Pointer [phone]  . oxyCODONE (OXY IR/ROXICODONE) 5 MG immediate release tablet    Sig: Take 1 tablet (5 mg total) by mouth every 8 (eight) hours as needed for up to 30 days for severe pain.    Dispense:  90 tablet    Refill:  0    Do not place this medication, or any other prescription from our practice, on "Automatic Refill". Patient may have prescription filled one day early if pharmacy is closed on scheduled refill date.    Order Specific Question:   Supervising Provider    Answer:   Milinda Pointer [phone]  . pregabalin (LYRICA) 75 MG capsule    Sig: Take 1 capsule (75 mg total) by mouth 3 (three) times daily.    Dispense:  90 capsule    Refill:  2    Do not add this medication to the electronic "Automatic Refill" notification system. Patient may have prescription filled one day early if pharmacy is closed on scheduled refill date.    Order Specific Question:   Supervising Provider    Answer:   Milinda Pointer [phone]  . oxyCODONE (OXY IR/ROXICODONE) 5 MG immediate release tablet    Sig: Take 1 tablet (5 mg total) by mouth every 8 (eight) hours as needed for up to 30 days for severe pain.    Dispense:  90 tablet    Refill:  0  Do not place this medication, or any other prescription from  our practice, on "Automatic Refill". Patient may have prescription filled one day early if pharmacy is closed on scheduled refill date.    Order Specific Question:   Supervising Provider    Answer:   Milinda Pointer [phone]  . oxyCODONE (OXY IR/ROXICODONE) 5 MG immediate release tablet    Sig: Take 1 tablet (5 mg total) by mouth every 8 (eight) hours as needed for up to 30 days for severe pain.    Dispense:  90 tablet    Refill:  0    Do not place this medication, or any other prescription from our practice, on "Automatic Refill". Patient may have prescription filled one day early if pharmacy is closed on scheduled refill date.    Order Specific Question:   Supervising Provider    Answer:   Milinda Pointer [[id number]]   Administered today: Moses Manners. [place] had no medications administered during this visit.  Orders:  No orders of the defined types were placed in this encounter.  Follow-up plan:   Return in about 3 months (around 01/11/2019) for MedMgmt, Procedure(w/Sedation).(L) L Fct RFA.  Per pandemic protocol   Interventional options:  Considering:  Diagnostic bilateral lumbar facet block #3 Possible bilateral lumbar facet radiofrequency ablation  Possible bilateral diagnostic sacroiliac joint block  Possible bilateral intra-articular hip joint injection  Possible bilateral intra-articular knee joint injection Possible bilateral genicular nerve block  Possible bilateral knee joint radiofrequency ablation.  Possible left-sided lumbar epidural steroid injection Possible right-sided cervical epidural steroid injection Possible bilateral diagnostic cervical facet block Possible bilateral diagnostic greater occipital nerve blocks Possible greater occipital nerve radiofrequency ablation.   Palliative PRN treatment(s):  Palliative bilateral lumbar facet radiofrequency ablation   Note by: Dionisio David, NP Date: 10/11/2018; Time: 12:03 PM

## 2018-10-11 NOTE — Patient Instructions (Signed)
____________________________________________________________________________________________  Medication Rules  Purpose: To inform patients, and their family members, of our rules and regulations.  Applies to: All patients receiving prescriptions (written or electronic).  Pharmacy of record: Pharmacy where electronic prescriptions will be sent. If written prescriptions are taken to a different pharmacy, please inform the nursing staff. The pharmacy listed in the electronic medical record should be the one where you would like electronic prescriptions to be sent.  Electronic prescriptions: In compliance with the [place] [employer]reng[place] Opioid Misuse Prevention (STOP) Act of 2017 (Session Law 2017-74/H243), effective July 19, 2018, all controlled substances must be electronically prescribed. Calling prescriptions to the pharmacy will cease to exist.  Prescription refills: Only during scheduled appointments. Applies to all prescriptions.  NOTE: The following applies primarily to controlled substances (Opioid* Pain Medications).   Patient's responsibilities: 1. Pain Pills: Bring all pain pills to every appointment (except for procedure appointments). 2. Pill Bottles: Bring pills in original pharmacy bottle. Always bring the newest bottle. Bring bottle, even if empty. 3. Medication refills: You are responsible for knowing and keeping track of what medications you take and those you need refilled. The day before your appointment: write a list of all prescriptions that need to be refilled. The day of the appointment: give the list to the admitting nurse. Prescriptions will be written only during appointments. No prescriptions will be written on procedure days. If you forget a medication: it will not be "Called in", "Faxed", or "electronically sent". You will need to get another appointment to get these prescribed. No early refills. Do not call asking to have your prescription filled  early. 4. Prescription Accuracy: You are responsible for carefully inspecting your prescriptions before leaving our office. Have the discharge nurse carefully go over each prescription with you, before taking them home. Make sure that your name is accurately spelled, that your address is correct. Check the name and dose of your medication to make sure it is accurate. Check the number of pills, and the written instructions to make sure they are clear and accurate. Make sure that you are given enough medication to last until your next medication refill appointment. 5. Taking Medication: Take medication as prescribed. When it comes to controlled substances, taking less pills or less frequently than prescribed is permitted and encouraged. Never take more pills than instructed. Never take medication more frequently than prescribed.  6. Inform other Doctors: Always inform, all of your healthcare providers, of all the medications you take. 7. Pain Medication from other Providers: You are not allowed to accept any additional pain medication from any other Doctor or Healthcare provider. There are two exceptions to this rule. (see below) In the event that you require additional pain medication, you are responsible for notifying us, as stated below. 8. Medication Agreement: You are responsible for carefully reading and following our Medication Agreement. This must be signed before receiving any prescriptions from our practice. Safely store a copy of your signed Agreement. Violations to the Agreement will result in no further prescriptions. (Additional copies of our Medication Agreement are available upon request.) 9. Laws, Rules, & Regulations: All patients are expected to follow all Federal and State Laws, Statutes, Rules, & Regulations. Ignorance of the Laws does not constitute a valid excuse. The use of any illegal substances is prohibited. 10. Adopted CDC guidelines & recommendations: Target dosing levels will be  at or below 60 MME/day. Use of benzodiazepines** is not recommended.  Exceptions: There are only two exceptions to the rule of not   receiving pain medications from other Healthcare Providers. 1. Exception #1 (Emergencies): In the event of an emergency (i.e.: accident requiring emergency care), you are allowed to receive additional pain medication. However, you are responsible for: As soon as you are able, call our office [phone], at any time of the day or night, and leave a message stating your name, the date and nature of the emergency, and the name and dose of the medication prescribed. In the event that your call is answered by a member of our staff, make sure to document and save the date, time, and the name of the person that took your information.  2. Exception #2 (Planned Surgery): In the event that you are scheduled by another doctor or dentist to have any type of surgery or procedure, you are allowed (for a period no longer than 30 days), to receive additional pain medication, for the acute post-op pain. However, in this case, you are responsible for picking up a copy of our "Post-op Pain Management for Surgeons" handout, and giving it to your surgeon or dentist. This document is available at our office, and does not require an appointment to obtain it. Simply go to our office during business hours (Monday-Thursday from 8:00 AM to 4:00 PM) (Friday 8:00 AM to 12:00 Noon) or if you have a scheduled appointment with us, prior to your surgery, and ask for it by name. In addition, you will need to provide us with your name, name of your surgeon, type of surgery, and date of procedure or surgery.  *Opioid medications include: morphine, codeine, oxycodone, oxymorphone, hydrocodone, hydromorphone, meperidine, tramadol, tapentadol, buprenorphine, fentanyl, methadone. **Benzodiazepine medications include: diazepam (Valium), alprazolam (Xanax), clonazepam (Klonopine), lorazepam (Ativan), clorazepate  (Tranxene), chlordiazepoxide (Librium), estazolam (Prosom), oxazepam (Serax), temazepam (Restoril), triazolam (Halcion) (Last updated: 09/15/2017) ____________________________________________________________________________________________    

## 2018-10-24 ENCOUNTER — Ambulatory Visit: Payer: Self-pay | Admitting: Pain Medicine

## 2018-10-24 ENCOUNTER — Telehealth: Payer: Self-pay | Admitting: Cardiovascular Disease

## 2018-10-24 ENCOUNTER — Other Ambulatory Visit: Payer: Self-pay | Admitting: Cardiovascular Disease

## 2018-10-24 NOTE — Telephone Encounter (Signed)
Virtual Visit Pre-Appointment Phone Call  Steps For Call:  1. Confirm consent - "In the setting of the current Covid19 crisis, you are scheduled for a (phone or video) visit with your provider on (date) at (time).  Just as we do with many in-office visits, in order for you to participate in this visit, we must obtain consent.  If you'd like, I can send this to your mychart (if signed up) or email for you to review.  Otherwise, I can obtain your verbal consent now.  All virtual visits are billed to your insurance company just like a normal visit would be.  By agreeing to a virtual visit, we'd like you to understand that the technology does not allow for your provider to perform an examination, and thus may limit your provider's ability to fully assess your condition.  Finally, though the technology is pretty good, we cannot assure that it will always work on either your or our end, and in the setting of a video visit, we may have to convert it to a phone-only visit.  In either situation, we cannot ensure that we have a secure connection.  Are you willing to proceed?"  2. Give patient instructions for WebEx download to smartphone as below if video visit  3. Advise patient to be prepared with any vital sign or heart rhythm information, their current medicines, and a piece of paper and pen handy for any instructions they may receive the day of their visit  4. Inform patient they will receive a phone call 15 minutes prior to their appointment time (may be from unknown caller ID) so they should be prepared to answer  5. Confirm that appointment type is correct in Epic appointment notes (video vs telephone)    TELEPHONE CALL NOTE  Erica [name] has been deemed a candidate for a follow-up tele-health visit to limit community exposure during the Covid-19 pandemic. I spoke with the patient via phone to ensure availability of phone/video source, confirm preferred email & phone number, and discuss  instructions and expectations.  I reminded Erica [name] to be prepared with any vital sign and/or heart rhythm information that could potentially be obtained via home monitoring, at the time of her visit. I reminded Erica [name] to expect a phone call at the time of her visit if her visit.  Did the patient verbally acknowledge consent to treatment? yes  Joline Maxcy 10/24/2018 4:11 PM   DOWNLOADING THE WEBEX SOFTWARE TO SMARTPHONE  - If Apple, go to [employer] and type in WebEx in the search bar. Download [employer], the blue/green circle. The app is free but as with any other app downloads, their phone may require them to verify saved payment information or Apple password. The patient does NOT have to create an account.  - If Android, ask patient to go to [employer] and type in WebEx in the search bar. Download [employer], the blue/green circle. The app is free but as with any other app downloads, their phone may require them to verify saved payment information or Android password. The patient does NOT have to create an account.   CONSENT FOR TELE-HEALTH VISIT - PLEASE REVIEW  I hereby voluntarily request, consent and authorize [facility] and its employed or contracted physicians, physician assistants, nurse practitioners or other licensed health care professionals (the Practitioner), to provide me with telemedicine health care services (the Services") as deemed necessary by the treating Practitioner. I  acknowledge and consent to receive the Services by the Practitioner via telemedicine. I understand that the telemedicine visit will involve communicating with the Practitioner through live audiovisual communication technology and the disclosure of certain medical information by electronic transmission. I acknowledge that I have been given the opportunity to request an in-person assessment or other available alternative prior to the telemedicine visit and am  voluntarily participating in the telemedicine visit.  I understand that I have the right to withhold or withdraw my consent to the use of telemedicine in the course of my care at any time, without affecting my right to future care or treatment, and that the Practitioner or I may terminate the telemedicine visit at any time. I understand that I have the right to inspect all information obtained and/or recorded in the course of the telemedicine visit and may receive copies of available information for a reasonable fee.  I understand that some of the potential risks of receiving the Services via telemedicine include:   Delay or interruption in medical evaluation due to technological equipment failure or disruption;  Information transmitted may not be sufficient (e.g. poor resolution of images) to allow for appropriate medical decision making by the Practitioner; and/or   In rare instances, security protocols could fail, causing a breach of personal health information.  Furthermore, I acknowledge that it is my responsibility to provide information about my medical history, conditions and care that is complete and accurate to the best of my ability. I acknowledge that Practitioner's advice, recommendations, and/or decision may be based on factors not within their control, such as incomplete or inaccurate data provided by me or distortions of diagnostic images or specimens that may result from electronic transmissions. I understand that the practice of medicine is not an exact science and that Practitioner makes no warranties or guarantees regarding treatment outcomes. I acknowledge that I will receive a copy of this consent concurrently upon execution via email to the email address I last provided but may also request a printed copy by calling the office of [facility].    I understand that my insurance will be billed for this visit.   I have read or had this consent read to me.  I understand the  contents of this consent, which adequately explains the benefits and risks of the Services being provided via telemedicine.   I have been provided ample opportunity to ask questions regarding this consent and the Services and have had my questions answered to my satisfaction.  I give my informed consent for the services to be provided through the use of telemedicine in my medical care  By participating in this telemedicine visit I agree to the above.

## 2018-10-30 ENCOUNTER — Telehealth: Payer: Self-pay

## 2018-10-30 NOTE — Progress Notes (Addendum)
Virtual Visit via Video Note   This visit type was conducted due to national recommendations for restrictions regarding the COVID-19 Pandemic (e.g. social distancing) in an effort to limit this patient's exposure and mitigate transmission in our community.  Due to her co-morbid illnesses, this patient is at least at moderate risk for complications without adequate follow up.  This format is felt to be most appropriate for this patient at this time.  All issues noted in this document were discussed and addressed.  A limited physical exam was performed with this format.  Please refer to the patient's chart for her consent to telehealth for [facility].    Date:  10/30/2018   ID:  Erica [name], DOB 03/12/1966, MRN [id number]  Patient Location:  [address] [place] [address]   Provider location:   [facility], [place] office  PCP:  Denton Lank, MD  Cardiologist:  [facility]  Chief Complaint:  SOB   History of Present Illness:    Erica [name] is a 53 y.o. female who presents via audio/video conferencing for a telehealth visit today.   The patient does not symptoms concerning for COVID-19 infection (fever, chills, cough, or new SHORTNESS OF BREATH).   Patient has a past medical history of 53y.o.femalewith h/o  smoking, COPD, on inhalers, quit 04/2016 medication noncompliance secondary to financial and insurance issues,  chronic thrush,  chronic back pain   hospital  November 2016 with shortness of breath, pneumonia,  echocardiogram showing normal LV function, EF 60%,  elevated right heart pressures,  sodium around 112, abnormal troponin, long hospital course CT 06/2015, No significant CAD, PAD, minimal in aortic arch She presents today for follow-up of her chronic diastolic CHF  Recently in the hospital February 4970 Alcoholic ketoacidosis Acute alcoholic gastritis  Had chest pain Acute hypokalemia, potassium 2.2, up to 3.8 Phos 1.1,  mag 1.3 Hospital records reviewed with the patient in detail Atypical chest pain: Her EKG is normal and echocardiogram was unremarkable.  HBA1C 6.0  Previously on losartan,  BP "ran slow", better with losartan   In follow-up today she reports that she is doing well May have broken left foot small toe, hit a recliner with her foot by accident  Leg swelling has essentially resolved Taking lasix 40 daily Sometimes extra lasix 20 in the PM She's moderating her fluid intake    Reports that she quit smoking October 2017 Occasionally breaks down has cigarette    Other past medical history reviewed weight gain, worsening shortness of breath Seen in the ER 06/14/16 with shortness of breath symptoms, given IV Lasix  Previously had several Prednisone tapers, most recently in jan 2018,  OSA, does not use CPAP. Had problems with anxiety  Continues to take Advair, spiriva and proair Problems with thrush, takes Diflucan 150 mg 1 as needed   CT scan chest  Oct 2017  Previous CT 06/2015 No significant CAD, PAD, minimal in aortic arch Images reviewed with her in detail   Recent EKG reviewed from 08/31/2016 when she saw CHF clinic showing normal sinus rhythm rate 66 bpm no significant ST or T-wave changes    Prior CV studies:   The following studies were reviewed today:  Echo 09/11/2018  1. The left ventricle has normal systolic function, with an ejection fraction of 55-60%. The cavity size was normal. There is mild concentric left ventricular hypertrophy. Left ventricular diastolic Doppler parameters are consistent with impaired  relaxation.  2. The right  ventricle has normal systolic function. The cavity was normal. There is no increase in right ventricular wall thickness.  3. The mitral valve is normal in structure.  4. The tricuspid valve is normal in structure.  5. The aortic valve is tricuspid.  6. The pulmonic valve was normal in structure.   Past Medical  History:  Diagnosis Date  . (HFpEF) heart failure with preserved ejection fraction ([place])    a. 05/2015 Echo: EF 60-65%, no rwma, PASP 41mHg.  . Arthritis   . Asthma   . Bell's palsy   . Bronchitis   . [place]   . COPD [place] disease) [place]   . Diabetes mellitus without complication ([place]    type II 03/2017  . Gastric ulcer   . Hyperlipidemia   . Hypertension   . OSA (obstructive sleep apnea)    a. did not tolerate CPAP.  .Marland KitchenPancreatitis    07/2018  . Shoulder injury    6/19   Past Surgical History:  Procedure Laterality Date  . FLEXIBLE BRONCHOSCOPY N/A 06/20/2015   Procedure: FLEXIBLE BRONCHOSCOPY;  Surgeon: PLaverle Hobby MD;  Location: [facility] ORS;  Service: Pulmonary;  Laterality: N/A;  . KNEE SURGERY Left    8 knee surgeries     No outpatient medications have been marked as taking for the 10/31/18 encounter (Appointment) with GMinna Merritts MD.     Allergies:   Tramadol and Penicillins   Social History   Tobacco Use  . Smoking status: Former Smoker    Packs/day: 1.00    Years: 36.00    Pack years: 36.00    Types: Cigarettes    Last attempt to quit: 06/13/2015    Years since quitting: 3.3  . Smokeless tobacco: Never Used  Substance Use Topics  . Alcohol use: No    Alcohol/week: 0.0 standard drinks    Frequency: Never    Comment: occasionaly  . Drug use: No     Current Outpatient Medications on File Prior to Visit  Medication Sig Dispense Refill  . albuterol (PROAIR HFA) 108 (90 Base) MCG/ACT inhaler Inhale 2 puffs into the lungs every 6 (six) hours as needed for wheezing or shortness of breath.     .Marland Kitchenaspirin EC 81 MG EC tablet Take 1 tablet (81 mg total) by mouth daily. 30 tablet 0  . atorvastatin (LIPITOR) 80 MG tablet Take 1 tablet (80 mg total) by mouth daily. 30 tablet 0  . Blood Glucose Monitoring Suppl (ACCU-CHEK AVIVA PLUS) w/Device KIT See admin instructions.  0  . Cholecalciferol  (VITAMIN D3) 2000 units capsule Take 1 capsule (2,000 Units total) by mouth daily. 30 capsule PRN  . diltiazem (CARDIZEM) 30 MG tablet TAKE 1 TABLET(30 MG) BY MOUTH THREE TIMES DAILY AS NEEDED 30 tablet 3  . diltiazem (CARTIA XT) 120 MG 24 hr capsule Take 1 capsule (120 mg total) by mouth daily. 30 capsule 0  . DULoxetine (CYMBALTA) 30 MG capsule Take 1 capsule (30 mg total) by mouth daily. (Patient taking differently: Take 30 mg by mouth daily. Takes with 671mfor 9034m30 capsule 0  . DULoxetine (CYMBALTA) 60 MG capsule Take 60 mg by mouth daily.     . famotidine (PEPCID) 40 MG tablet Take 1 tablet (40 mg total) by mouth every evening for 30 days. 30 tablet 0  . Fluticasone-Salmeterol (ADVAIR) 250-50 MCG/DOSE AEPB Inhale 1 puff into the lungs 2 (two) times daily.    . furosemide (LASIX) 40  MG tablet Take 1 tablet (40 mg total) by mouth daily. Take an additional 40 mg in the am for weight gain as needed 30 tablet 0  . metFORMIN (GLUCOPHAGE) 500 MG tablet Take 1 tablet (500 mg total) by mouth 2 (two) times daily with a meal. 30 tablet 0  . [START ON 12/11/2018] oxyCODONE (OXY IR/ROXICODONE) 5 MG immediate release tablet Take 1 tablet (5 mg total) by mouth every 8 (eight) hours as needed for up to 30 days for severe pain. 90 tablet 0  . [START ON 11/10/2018] oxyCODONE (OXY IR/ROXICODONE) 5 MG immediate release tablet Take 1 tablet (5 mg total) by mouth every 8 (eight) hours as needed for up to 30 days for severe pain. 90 tablet 0  . oxyCODONE (OXY IR/ROXICODONE) 5 MG immediate release tablet Take 1 tablet (5 mg total) by mouth every 8 (eight) hours as needed for up to 30 days for severe pain. 90 tablet 0  . OXYGEN Inhale 2 L into the lungs at bedtime.     . pantoprazole (PROTONIX) 40 MG tablet Take 1 tablet (40 mg total) by mouth daily. 30 tablet 0  . phosphorus (K PHOS NEUTRAL) 155-852-130 MG tablet Take 2 tablets (500 mg total) by mouth 2 (two) times daily. 30 tablet 0  . pregabalin (LYRICA) 75 MG  capsule Take 1 capsule (75 mg total) by mouth 3 (three) times daily. 90 capsule 2  . sucralfate (CARAFATE) 1 G tablet Take 1 tablet (1 g total) by mouth 4 (four) times daily -  with meals and at bedtime. (Patient taking differently: Take 1 g by mouth 2 (two) times daily. ) 120 tablet 0  . tiotropium (SPIRIVA HANDIHALER) 18 MCG inhalation capsule Place 18 mcg into inhaler and inhale daily.    Marland Kitchen tiZANidine (ZANAFLEX) 4 MG capsule Take 1 capsule (4 mg total) by mouth 3 (three) times daily as needed for muscle spasms. 90 capsule 2   No current facility-administered medications on file prior to visit.      Family Hx: The patient's family history includes Breast cancer in her mother; Diabetes in her maternal grandmother; Lung cancer in her father.  ROS:   Please see the history of present illness.    Review of Systems  Constitutional: Negative.   Respiratory: Negative.   Cardiovascular: Negative.   Gastrointestinal: Negative.   Musculoskeletal: Negative.   Neurological: Negative.   Psychiatric/Behavioral: Negative.   All other systems reviewed and are negative.     Labs/Other Tests and Data Reviewed:    Recent Labs: 09/10/2018: ALT 32 09/11/2018: Hemoglobin 10.2; Platelets 167 09/12/2018: BUN 7; Creatinine, Ser 0.56; Magnesium 2.0; Potassium 3.8; Sodium 138   Recent Lipid Panel Lab Results  Component Value Date/Time   CHOL 145 09/11/2018 05:27 AM   TRIG 163 (H) 09/11/2018 05:27 AM   HDL 42 09/11/2018 05:27 AM   CHOLHDL 3.5 09/11/2018 05:27 AM   LDLCALC 70 09/11/2018 05:27 AM    Wt Readings from Last 3 Encounters:  10/11/18 200 lb (90.7 kg)  09/10/18 192 lb 15.9 oz (87.5 kg)  08/29/18 193 lb (87.5 kg)     Exam:    Vital Signs: Vital signs may also be detailed in the HPI LMP 03/13/2015 (Approximate) Comment: more than 2 years ago  Wt Readings from Last 3 Encounters:  10/11/18 200 lb (90.7 kg)  09/10/18 192 lb 15.9 oz (87.5 kg)  08/29/18 193 lb (87.5 kg)   Temp Readings  from Last 3 Encounters:  10/11/18 98.4 F (  36.9 C) (Oral)  09/12/18 97.6 F (36.4 C) (Oral)  08/29/18 98.6 F (37 C)   BP Readings from Last 3 Encounters:  10/11/18 139/60  09/12/18 125/68  08/29/18 137/78   Pulse Readings from Last 3 Encounters:  10/11/18 95  09/12/18 86  08/29/18 (!) 109    Vitals: BP: 119/65 in AM, sometimes higher in evening Pulse:80 resp 16  Well nourished, well developed female in no acute distress. Constitutional:  oriented to person, place, and time. No distress.  Head: Normocephalic and atraumatic.  Eyes:  no discharge. No scleral icterus.  Neck: Normal range of motion. Neck supple.  Pulmonary/Chest: No audible wheezing, no distress, appears comfortable Musculoskeletal: Normal range of motion.  no  tenderness or deformity.  Neurological:   Coordination normal. Full exam not performed Skin:  No rash Psychiatric:  normal mood and affect. behavior is normal. Thought content normal.    ASSESSMENT & PLAN:    Chronic diastolic heart failure (HCC) Weight relatively stable Taking Lasix 40 mg daily, extra Lasix after lunch 20-40 for any ankle swelling Appears euvolemic today  Smoker Reports that she stopped smoking October 2017  Palpitations Denies having significant palpitations No further workup at this time On diltiazem  Pulmonary HTN Euvolemic High pressures noted on echo 2016 Improved on echo 2020 On lasix  Shortness of breath  severe COPD, obesity, deconditioning, diastolic CHF/pulmonary hypertension On oxygen around the house stable  COPD (chronic obstructive pulmonary disease) with acute bronchitis (HCC) Currently uses  steroid inhalers, albuterol On oxygen around the house stable  Obstructive sleep apnea Unable to tolerate CPAP, Panic attacks  Morbid obesity ([place]) We have encouraged continued exercise, careful diet management in an effort to lose weight.  Essential HTN: On dilatem 120 daily diltazem 30 as  needed  Panic attacks: On cymbalta, lyrica Would like ativan for PRN, should not have any problem with cardiac issues   COVID-19 Education: The signs and symptoms of COVID-19 were discussed with the patient and how to seek care for testing (follow up with PCP or arrange E-visit).  The importance of social distancing was discussed today.  Patient Risk:   After full review of this patients clinical status, I feel that they are at least moderate risk at this time.  Time:   Today, I have spent 25 minutes with the patient with telehealth technology discussing the cardiac and medical problems/diagnoses detailed above     Medication Adjustments/Labs and Tests Ordered: Current medicines are reviewed at length with the patient today.  Concerns regarding medicines are outlined above.   Tests Ordered: No tests ordered   Medication Changes: No changes made   Disposition: Follow-up in 6 months   Signed, Ida Rogue, MD  10/30/2018 4:12 PM    [place] [facility] [address], [place], [place] [address]

## 2018-10-30 NOTE — Telephone Encounter (Signed)
lmov for patient to call back.  Would like to review how tomorrows video visit will go.

## 2018-10-31 ENCOUNTER — Telehealth: Payer: Medicaid Other | Admitting: Cardiovascular Disease

## 2018-10-31 ENCOUNTER — Other Ambulatory Visit: Payer: Self-pay

## 2018-10-31 ENCOUNTER — Telehealth (INDEPENDENT_AMBULATORY_CARE_PROVIDER_SITE_OTHER): Payer: Medicaid Other | Admitting: Cardiovascular Disease

## 2018-10-31 DIAGNOSIS — J440 Chronic obstructive pulmonary disease with acute lower respiratory infection: Secondary | ICD-10-CM | POA: Diagnosis not present

## 2018-10-31 DIAGNOSIS — I10 Essential (primary) hypertension: Secondary | ICD-10-CM

## 2018-10-31 DIAGNOSIS — I272 Pulmonary hypertension, unspecified: Secondary | ICD-10-CM

## 2018-10-31 DIAGNOSIS — I5032 Chronic diastolic (congestive) heart failure: Secondary | ICD-10-CM | POA: Diagnosis not present

## 2018-10-31 DIAGNOSIS — E782 Mixed hyperlipidemia: Secondary | ICD-10-CM

## 2018-10-31 DIAGNOSIS — J209 Acute bronchitis, unspecified: Secondary | ICD-10-CM

## 2018-10-31 DIAGNOSIS — E119 Type 2 diabetes mellitus without complications: Secondary | ICD-10-CM

## 2018-10-31 MED ORDER — DILTIAZEM HCL 30 MG PO TABS: ORAL_TABLET | ORAL | 3 refills | Status: DC

## 2018-10-31 MED ORDER — ATORVASTATIN CALCIUM 80 MG PO TABS: 80.0000 mg | ORAL_TABLET | Freq: Every day | ORAL | 3 refills | Status: DC

## 2018-10-31 MED ORDER — DILTIAZEM HCL ER COATED BEADS 120 MG PO CP24: 120.0000 mg | ORAL_CAPSULE | Freq: Every day | ORAL | 3 refills | Status: DC

## 2018-10-31 MED ORDER — FUROSEMIDE 40 MG PO TABS: 40.0000 mg | ORAL_TABLET | Freq: Every day | ORAL | 3 refills | Status: DC

## 2018-10-31 NOTE — Patient Instructions (Addendum)
Medication Instructions:  Continue current medications.   If you need a refill on your cardiac medications before your next appointment, please call your pharmacy.    Lab work: No new labs needed   If you have labs (blood work) drawn today and your tests are completely normal, you will receive your results only by: . MyChart Message (if you have MyChart) OR . A paper copy in the mail If you have any lab test that is abnormal or we need to change your treatment, we will call you to review the results.   Testing/Procedures: No new testing needed   Follow-Up: At [facility], you and your health needs are our priority.  As part of our continuing mission to provide you with exceptional heart care, we have created designated Provider Care Teams.  These Care Teams include your primary Cardiologist (physician) and Advanced Practice Providers (APPs -  Physician Assistants and Nurse Practitioners) who all work together to provide you with the care you need, when you need it.  . You will need a follow up appointment in 12 months .   Please call our office 2 months in advance to schedule this appointment.    . Providers on your designated Care Team:   . Christopher Berge, NP . Ryan Dunn, PA-C . Jacquelyn Visser, PA-C  Any Other Special Instructions Will Be Listed Below (If Applicable).  For educational health videos Log in to : [email or web address] Or : [email or web address], password : [email or web address]   

## 2018-11-10 ENCOUNTER — Telehealth: Payer: Self-pay

## 2018-12-04 ENCOUNTER — Telehealth: Payer: Self-pay

## 2018-12-04 NOTE — Telephone Encounter (Signed)
Disability paperwork received from [employer] [place]. Faxed to [employer] with confirmation.  

## 2018-12-28 ENCOUNTER — Telehealth: Payer: Self-pay | Admitting: Cardiovascular Disease

## 2018-12-28 ENCOUNTER — Telehealth: Payer: Self-pay | Admitting: *Deleted

## 2018-12-28 NOTE — Telephone Encounter (Signed)
Attempted to call for pre procedure assessment. Unable to leave a message.

## 2018-12-28 NOTE — Telephone Encounter (Signed)
Received forms from [employer] through fax to be completed Sent through interoffice mail to [facility]

## 2018-12-31 NOTE — Progress Notes (Signed)
Pain Management Virtual Encounter Note - Virtual Visit via Telephone   Patient's Phone No. & Preferred Pharmacy:  [phone] (home); [phone] (mobile); (Preferred) [phone] [email or web address]   Reason for Virtual Visit: COVID-19*  Social distancing based on CDC and AMA recommendations.   I attempted to contact [name] on 01/01/2019 and on 01/02/19 via telephone. I was unable to complete the visit due to no one answering the phone, or unable to get a proper contact number. I was also unable to leave a message.   Note by: Gaspar Cola, MD Date: initial 3 attempts on 01/01/2019. Fourth and final attempt on 01/02/2019; Time: 3:15 PM

## 2019-01-01 ENCOUNTER — Ambulatory Visit: Payer: Medicaid Other | Attending: Nurse Practitioner | Admitting: Pain Medicine

## 2019-01-01 ENCOUNTER — Encounter: Payer: Medicaid Other | Admitting: Nurse Practitioner

## 2019-01-01 ENCOUNTER — Telehealth: Payer: Self-pay

## 2019-01-01 ENCOUNTER — Other Ambulatory Visit: Payer: Self-pay

## 2019-01-01 DIAGNOSIS — G894 Chronic pain syndrome: Secondary | ICD-10-CM

## 2019-01-01 NOTE — Telephone Encounter (Signed)
Attempted to call patient on 6-05-21-2019, 12-29-2018, and again on 6-15 2020 at 0856.  There was no answer and no way to leave a message.

## 2019-01-19 ENCOUNTER — Other Ambulatory Visit: Admission: RE | Admit: 2019-01-19 | Payer: Medicaid Other | Source: Ambulatory Visit

## 2019-01-23 ENCOUNTER — Ambulatory Visit: Payer: Self-pay | Admitting: Pain Medicine

## 2019-01-25 ENCOUNTER — Telehealth: Payer: Self-pay | Admitting: *Deleted

## 2019-01-25 NOTE — Telephone Encounter (Signed)
Attempted to call for pre appointment assessment. I received a message that stated, "unable to complete this call".

## 2019-01-26 ENCOUNTER — Telehealth: Payer: Self-pay

## 2019-01-26 NOTE — Telephone Encounter (Signed)
2nd attempt to call patient for pre virtual visit appoinment questions.  The call was unable to be completed.

## 2019-01-29 ENCOUNTER — Encounter: Payer: Self-pay | Admitting: Pain Medicine

## 2019-01-29 ENCOUNTER — Other Ambulatory Visit: Payer: Self-pay

## 2019-01-29 ENCOUNTER — Ambulatory Visit: Payer: Medicaid Other | Attending: Nurse Practitioner | Admitting: Pain Medicine

## 2019-01-29 DIAGNOSIS — M25561 Pain in right knee: Secondary | ICD-10-CM

## 2019-01-29 DIAGNOSIS — M25552 Pain in left hip: Secondary | ICD-10-CM

## 2019-01-29 DIAGNOSIS — G894 Chronic pain syndrome: Secondary | ICD-10-CM

## 2019-01-29 DIAGNOSIS — G609 Hereditary and idiopathic neuropathy, unspecified: Secondary | ICD-10-CM

## 2019-01-29 DIAGNOSIS — M792 Neuralgia and neuritis, unspecified: Secondary | ICD-10-CM

## 2019-01-29 DIAGNOSIS — M47816 Spondylosis without myelopathy or radiculopathy, lumbar region: Secondary | ICD-10-CM

## 2019-01-29 DIAGNOSIS — M545 Low back pain: Secondary | ICD-10-CM

## 2019-01-29 DIAGNOSIS — G8929 Other chronic pain: Secondary | ICD-10-CM

## 2019-01-29 DIAGNOSIS — M25562 Pain in left knee: Secondary | ICD-10-CM

## 2019-01-29 DIAGNOSIS — E559 Vitamin D deficiency, unspecified: Secondary | ICD-10-CM

## 2019-01-29 DIAGNOSIS — M62830 Muscle spasm of back: Secondary | ICD-10-CM

## 2019-01-29 DIAGNOSIS — M25551 Pain in right hip: Secondary | ICD-10-CM

## 2019-01-29 DIAGNOSIS — R252 Cramp and spasm: Secondary | ICD-10-CM

## 2019-01-29 DIAGNOSIS — M1732 Unilateral post-traumatic osteoarthritis, left knee: Secondary | ICD-10-CM

## 2019-01-29 NOTE — Progress Notes (Signed)
Failed attempt at Virtual Visit via Telephone   Patient's Phone No. & Preferred Pharmacy:  [phone] (home); [phone] (mobile); (Preferred) [phone] [email or web address]   I attempted to contact [name] multiple times on 01/29/2019 via telephone. I was unable to complete the visit due to no one answering the phone, or unable to get a proper contact number. I was also unable to leave a message.  We have been trying to contact this patient on multiple locations since 01/01/2019, but the telephone number that she has provided [place] does not appear to be in service.  Pharmacotherapy Assessment  Analgesic: Oxycodone IR 5 mg 1 tablet by mouth every 8 hours (15mg /dayof oxycodone) MME/day:22.5mg /day.   Follow-up plan:   No follow-ups on file.      Interventional options: Considering:   Therapeutic bilateral lumbar facet RFA#1 (starting with the left side) Possible bilateral lumbar facet RFA Possible bilateral diagnostic sacroiliac joint block Possible bilateral intra-articular hip joint injection Possible bilateral intra-articular knee joint injection  Possible bilateral genicular nerve block Possible bilateral knee joint RFA  Possible left-sided LESI  Possible right-sided CESI  Possible bilateral diagnostic cervical facet block  Possible bilateral diagnostic GONB  Possible greater occipital nerve RFA    Palliative PRN treatment(s):   Palliative bilateral lumbar facet blocks  Palliative bilateral intra-articular Hyalgan knee injection #S2/N1 (last done on 11/08/2017)    Recent Visits No visits were found meeting these conditions.  Showing recent visits within past 90 days and meeting all other requirements   Today's Visits Date Type Provider Dept  01/29/19 Office Visit Milinda Pointer, MD [facility]  Showing today's visits and meeting all other requirements   Future Appointments No visits were found meeting these conditions.  Showing future  appointments within next 90 days and meeting all other requirements   Note by: Gaspar Cola, MD Date: 01/29/2019; Time: 9:05 AM

## 2019-02-12 ENCOUNTER — Ambulatory Visit: Payer: Medicaid Other | Admitting: Pain Medicine

## 2019-02-19 ENCOUNTER — Encounter: Payer: Self-pay | Admitting: Pain Medicine

## 2019-02-20 ENCOUNTER — Other Ambulatory Visit: Payer: Self-pay

## 2019-02-20 ENCOUNTER — Ambulatory Visit: Payer: Medicaid Other | Attending: Pain Medicine | Admitting: Pain Medicine

## 2019-02-20 DIAGNOSIS — M545 Low back pain: Secondary | ICD-10-CM

## 2019-02-20 DIAGNOSIS — M25551 Pain in right hip: Secondary | ICD-10-CM | POA: Diagnosis not present

## 2019-02-20 DIAGNOSIS — G8929 Other chronic pain: Secondary | ICD-10-CM

## 2019-02-20 DIAGNOSIS — M25561 Pain in right knee: Secondary | ICD-10-CM

## 2019-02-20 DIAGNOSIS — M792 Neuralgia and neuritis, unspecified: Secondary | ICD-10-CM

## 2019-02-20 DIAGNOSIS — G894 Chronic pain syndrome: Secondary | ICD-10-CM | POA: Diagnosis not present

## 2019-02-20 DIAGNOSIS — M47816 Spondylosis without myelopathy or radiculopathy, lumbar region: Secondary | ICD-10-CM | POA: Diagnosis not present

## 2019-02-20 DIAGNOSIS — F4310 Post-traumatic stress disorder, unspecified: Secondary | ICD-10-CM | POA: Insufficient documentation

## 2019-02-20 DIAGNOSIS — M7918 Myalgia, other site: Secondary | ICD-10-CM

## 2019-02-20 DIAGNOSIS — M1732 Unilateral post-traumatic osteoarthritis, left knee: Secondary | ICD-10-CM

## 2019-02-20 DIAGNOSIS — M62830 Muscle spasm of back: Secondary | ICD-10-CM

## 2019-02-20 DIAGNOSIS — M25552 Pain in left hip: Secondary | ICD-10-CM

## 2019-02-20 DIAGNOSIS — R252 Cramp and spasm: Secondary | ICD-10-CM

## 2019-02-20 DIAGNOSIS — G609 Hereditary and idiopathic neuropathy, unspecified: Secondary | ICD-10-CM

## 2019-02-20 DIAGNOSIS — M25562 Pain in left knee: Secondary | ICD-10-CM

## 2019-02-20 MED ORDER — TIZANIDINE HCL 4 MG PO CAPS: 4.0000 mg | ORAL_CAPSULE | Freq: Three times a day (TID) | ORAL | 2 refills | Status: DC | PRN

## 2019-02-20 MED ORDER — OXYCODONE HCL 5 MG PO TABS: 5.0000 mg | ORAL_TABLET | Freq: Three times a day (TID) | ORAL | 0 refills | Status: DC | PRN

## 2019-02-20 MED ORDER — PREGABALIN 75 MG PO CAPS: 75.0000 mg | ORAL_CAPSULE | Freq: Three times a day (TID) | ORAL | 2 refills | Status: DC

## 2019-02-20 NOTE — Progress Notes (Signed)
Pain Management Virtual Encounter Note - Virtual Visit via Telephone Telehealth (real-time audio visits between healthcare provider and patient).   Patient's Phone No. & Preferred Pharmacy:  [phone] (home); [phone] (mobile); (Preferred) [phone] [email or web address]  [facility] [phone] Nicholes Rough- [place], [place] - [id number] [facility] [address] [place] [id number] Phone: [phone] Fax: [phone]    Pre-screening note:  Our staff contacted [name] and offered her an "in person", "face-to-face" appointment versus a telephone encounter. She indicated preferring the telephone encounter, at this time.   Reason for Virtual Visit: COVID-19*  Social distancing based on CDC and AMA recommendations.   I contacted Erica Asalonna A Perleberg on 02/20/2019 via telephone.      I clearly identified myself as Oswaldo DoneFrancisco A Troi Florendo, MD. I verified that I was speaking with the correct person using two identifiers (Name: Erica AsalDonna A Frickey, and date of birth: 05/19/1966).  Advanced Informed Consent I sought verbal advanced consent from Erica Asalonna A Isip for virtual visit interactions. I informed [name] of possible security and privacy concerns, risks, and limitations associated with providing "not-in-person" medical evaluation and management services. I also informed [name] of the availability of "in-person" appointments. Finally, I informed her that there would be a charge for the virtual visit and that she could be  personally, fully or partially, financially responsible for it. [name] expressed understanding and agreed to proceed.   Historic Elements   Ms. Erica AsalDonna A [name] is a 53 y.o. year old, female patient evaluated today after her last encounter by our practice on 01/29/2019. [name]  has a past medical history of (HFpEF) heart failure with preserved ejection fraction [facility](HCC), Acute pancreatitis (08/13/2018), Arthritis, Asthma, Bell's palsy, Bronchitis, CHF (congestive heart  failure) (HCC), COPD (chronic obstructive pulmonary disease) (HCC), Diabetes mellitus without complication (HCC), Gastric ulcer, Hyperlipidemia, Hypertension, OSA (obstructive sleep apnea), Pancreatitis, and Shoulder injury. She also  has a past surgical history that includes Knee surgery (Left) and Flexible bronchoscopy (N/A, 06/20/2015). [name] has a current medication list which includes the following prescription(s): albuterol, aspirin, atorvastatin, accu-chek aviva plus, buspirone, vitamin d3, diltiazem, diltiazem, duloxetine, duloxetine, fluticasone-salmeterol, furosemide, losartan, metformin, oxycodone, oxycodone, oxycodone, oxygen-helium, pantoprazole, phosphorus, pregabalin, sucralfate, tiotropium, tizanidine, and famotidine. She  reports that she quit smoking about 3 years ago. Her smoking use included cigarettes. She has a 36.00 pack-year smoking history. She has never used smokeless tobacco. She reports that she does not drink alcohol or use drugs. [name] is allergic to tramadol and penicillins.   HPI  Today, she is being contacted for medication management.  Pharmacotherapy Assessment  Analgesic: Oxycodone IR 5 mg 1 tablet by mouth every 8 hours (15mg /dayof oxycodone) MME/day:22.5mg /day.   Monitoring: Pharmacotherapy: No side-effects or adverse reactions reported. [place] PMP: PDMP reviewed during this encounter.       Compliance: No problems identified. Effectiveness: Clinically acceptable. Plan: Refer to "POC".  UDS:  Summary  Date Value Ref Range Status  08/29/2018 FINAL  Final    Comment:    ==================================================================== TOXASSURE SELECT 13 (MW) ==================================================================== Test                             Result       Flag       Units   NO DRUGS DETECTED. ==================================================================== Test                      Result  Flag   Units      Ref Range    Creatinine              167              mg/dL      >=20 ==================================================================== Declared Medications:  The flagging and interpretation on this report are based on the  following declared medications.  Unexpected results may arise from  inaccuracies in the declared medications.  **Note: The testing scope of this panel does not include following  reported medications:  Albuterol  Aspirin  Atorvastatin  Calcium  Cholecalciferol  Diltiazem  Duloxetine  Fluconazole  Fluticasone  Folic acid ==================================================================== For clinical consultation, please call [phone]. ====================================================================    Laboratory Chemistry Profile (12 mo)  Renal: 09/12/2018: BUN 7; Creatinine, Ser 0.56; GFR calc Af Amer >60; GFR calc non Af Amer >60  Hepatic: 09/10/2018: Albumin 3.5; ALT 32; AST 35 Other: No results found for requested labs within last 8760 hours. Note: Above Lab results reviewed.  Imaging  Last 90 days:  No results found. Last Hospital Admission:  Ct Angio Chest Pe W Or Wo Contrast  Result Date: 09/11/2018 CLINICAL DATA:  Chest pain. EXAM: CT ANGIOGRAPHY CHEST WITH CONTRAST TECHNIQUE: Multidetector CT imaging of the chest was performed using the standard protocol during bolus administration of intravenous contrast. Multiplanar CT image reconstructions and MIPs were obtained to evaluate the vascular anatomy. CONTRAST:  88mL OMNIPAQUE IOHEXOL 350 MG/ML SOLN COMPARISON:  Radiographs of September 10, 2018. CT scan of April 27, 2016. FINDINGS: Cardiovascular: Satisfactory opacification of the pulmonary arteries to the segmental level. No evidence of pulmonary embolism. Normal heart size. No pericardial effusion. Atherosclerosis of thoracic aorta is noted without aneurysm or dissection. Mediastinum/Nodes: No enlarged mediastinal, hilar, or axillary lymph nodes.  Thyroid gland, trachea, and esophagus demonstrate no significant findings. Lungs/Pleura: Stable 5 mm subpleural nodule is seen in left lower lobe; this can be considered benign at this point with no further follow-up required. No pneumothorax or pleural effusion is noted. Mild bilateral posterior basilar subsegmental atelectasis is noted. Right lung is unremarkable. Upper Abdomen: No acute abnormality. Musculoskeletal: No chest wall abnormality. No acute or significant osseous findings. Review of the MIP images confirms the above findings. IMPRESSION: No definite evidence of pulmonary embolus. Aortic Atherosclerosis (ICD10-I70.0). Electronically Signed   By: Marijo Conception, M.D.   On: 09/11/2018 14:25   Assessment  The primary encounter diagnosis was Chronic pain syndrome. Diagnoses of Chronic low back pain (Primary Area of Pain) (Bilateral) (L>R), Lumbar facet syndrome (Bilateral) (R>L), Chronic hip pain (Secondary area of Pain) (Bilateral) (L>R), Chronic knee pain (Third area of Pain) (Bilateral) (L>R), Chronic musculoskeletal pain, Intermittent left thoracic Muscle cramps, Spasm of thoracolumbar muscle (Left), Post-traumatic osteoarthritis of knee (Left), Peripheral neuropathy, idiopathic (upper and lower extremity), and Neurogenic pain were also pertinent to this visit.  Plan of Care  I have changed Glenis A. Mauss's oxyCODONE. I am also having her start on oxyCODONE and oxyCODONE. Additionally, I am having her maintain her aspirin, sucralfate, tiotropium, albuterol, DULoxetine, OXYGEN, Accu-Chek Aviva Plus, Vitamin D3, DULoxetine, metFORMIN, pantoprazole, Fluticasone-Salmeterol, famotidine, phosphorus, atorvastatin, diltiazem, diltiazem, furosemide, losartan, busPIRone, tiZANidine, and pregabalin.  Pharmacotherapy (Medications Ordered): Meds ordered this encounter  Medications  . oxyCODONE (OXY IR/ROXICODONE) 5 MG immediate release tablet    Sig: Take 1 tablet (5 mg total) by mouth every 8 (eight)  hours as needed for severe pain. Must last 30 days    Dispense:  90 tablet    Refill:  0    Chronic Pain: STOP Act (Not applicable) Fill 1 day early if closed on refill date. Do not fill until: 02/20/2019. To last until: 03/22/2019. Avoid benzodiazepines within 8 hours of opioids  . tiZANidine (ZANAFLEX) 4 MG capsule    Sig: Take 1 capsule (4 mg total) by mouth 3 (three) times daily as needed for muscle spasms.    Dispense:  90 capsule    Refill:  2    Fill one day early if pharmacy is closed on scheduled refill date. May substitute for generic if available.  . pregabalin (LYRICA) 75 MG capsule    Sig: Take 1 capsule (75 mg total) by mouth 3 (three) times daily.    Dispense:  90 capsule    Refill:  2    Fill one day early if pharmacy is closed on scheduled refill date. May substitute for generic if available.  Marland Kitchen. oxyCODONE (OXY IR/ROXICODONE) 5 MG immediate release tablet    Sig: Take 1 tablet (5 mg total) by mouth every 8 (eight) hours as needed for severe pain. Must last 30 days    Dispense:  90 tablet    Refill:  0    Chronic Pain: STOP Act (Not applicable) Fill 1 day early if closed on refill date. Do not fill until: 03/22/2019. To last until: 04/21/2019. Avoid benzodiazepines within 8 hours of opioids  . oxyCODONE (OXY IR/ROXICODONE) 5 MG immediate release tablet    Sig: Take 1 tablet (5 mg total) by mouth every 8 (eight) hours as needed for severe pain. Must last 30 days    Dispense:  90 tablet    Refill:  0    Chronic Pain: STOP Act (Not applicable) Fill 1 day early if closed on refill date. Do not fill until: 04/21/2019. To last until: 05/21/2019. Avoid benzodiazepines within 8 hours of opioids   Orders:  Orders Placed This Encounter  Procedures  . Radiofrequency,Lumbar    Standing Status:   Future    Standing Expiration Date:   08/22/2020    Scheduling Instructions:     Side(s): Left-sided     Level: L3-4, L4-5, & L5-S1 Facets (L2, L3, L4, L5, & S1 Medial Branch Nerves)     Sedation:  With Sedation     Scheduling Timeframe: As soon as pre-approved    Order Specific Question:   Where will this procedure be performed?    Answer:   [facility]   Follow-up plan:   Return in about 3 months (around 05/21/2019) for (VV), E/M (MM), in addition, RFA: (L) L-FCT RFA #1, (w/ Sedation), (ASAP).      Interventional options: Considering:   Therapeutic bilateral lumbar facet RFA#1 (starting with the left side) Possible bilateral lumbar facet RFA Possible bilateral diagnostic sacroiliac joint block Possible bilateral intra-articular hip joint injection Possible bilateral intra-articular knee joint injection  Possible bilateral genicular nerve block Possible bilateral knee joint RFA  Possible left-sided LESI  Possible right-sided CESI  Possible bilateral diagnostic cervical facet block  Possible bilateral diagnostic GONB  Possible greater occipital nerve RFA    Palliative PRN treatment(s):   Palliative bilateral lumbar facet blocks  Palliative bilateral intra-articular Hyalgan knee injection #S2/N1 (last done on 11/08/2017)     Recent Visits No visits were found meeting these conditions.  Showing recent visits within past 90 days and meeting all other requirements   Today's Visits Date Type Provider Dept  02/20/19 Office Visit Delano MetzNaveira, Gaetano Romberger, MD [facility]  Showing today's visits and meeting all other requirements   Future Appointments No visits were found meeting these conditions.  Showing future appointments within next 90 days and meeting all other requirements   I discussed the assessment and treatment plan with the patient. The patient was provided an opportunity to ask questions and all were answered. The patient agreed with the plan and demonstrated an understanding of the instructions.  Patient advised to call back or seek an in-person evaluation if the symptoms or condition worsens.  Total duration of non-face-to-face encounter: 20  minutes.  Note by: Oswaldo DoneFrancisco A Aztlan Coll, MD Date: 02/20/2019; Time: 4:15 PM  Note: This dictation was prepared with Dragon dictation. Any transcriptional errors that may result from this process are unintentional.  Disclaimer:  * Given the special circumstances of the COVID-19 pandemic, the federal government has announced that the Office for Civil Rights (OCR) will exercise its enforcement discretion and will not impose penalties on physicians using telehealth in the event of noncompliance with regulatory requirements under the [employer] Insurance Portability and Accountability Act (HIPAA) in connection with the good faith provision of telehealth during the COVID-19 national public health emergency. (AMA)

## 2019-02-20 NOTE — Patient Instructions (Signed)

## 2019-03-22 ENCOUNTER — Ambulatory Visit: Payer: Medicaid Other | Admitting: Pain Medicine

## 2019-03-22 NOTE — Progress Notes (Deleted)
Patient's Name: Erica [name]  MRN: [id number]  Referring Provider: Delano Metz, MD  DOB: 01-28-1966  PCP: Hillery Aldo, MD  DOS: 03/22/2019  Note by: Oswaldo Done, MD  Service setting: Ambulatory outpatient  Specialty: Interventional Pain Management  Patient type: Established  Location: [facility] (AMB) [facility] Facility  Visit type: Interventional Procedure   Primary Reason for Visit: Interventional Pain Management Treatment. CC: No chief complaint on file.  Procedure:          Anesthesia, Analgesia, Anxiolysis:  Type: Thermal Lumbar Facet, Medial Branch Radiofrequency Ablation/Neurotomy           Primary Purpose: Therapeutic Region: Posterolateral Lumbosacral Spine Level: L2, L3, L4, L5, & S1 Medial Branch Level(s). These levels will denervate the L3-4, L4-5, and the L5-S1 lumbar facet joints. Laterality: Left  Type: Moderate (Conscious) Sedation combined with Local Anesthesia Indication(s): Analgesia and Anxiety Route: Intravenous (IV) IV Access: Secured Sedation: Meaningful verbal contact was maintained at all times during the procedure  Local Anesthetic: Lidocaine 1-2%  Position: Prone   Indications: 1. Lumbar facet syndrome (Bilateral) (R>L)   2. Spondylosis without myelopathy or radiculopathy, lumbosacral region   3. Lumbar spondylosis   4. DDD (degenerative disc disease), lumbosacral   5. Osteoarthritis of facet joint of lumbar spine   6. Chronic low back pain (Primary Area of Pain) (Bilateral) (L>R)    Erica [name] has been dealing with the above chronic pain for longer than three months and has either failed to respond, was unable to tolerate, or simply did not get enough benefit from other more conservative therapies including, but not limited to: 1. Over-the-counter medications 2. Anti-inflammatory medications 3. Muscle relaxants 4. Membrane stabilizers 5. Opioids 6. Physical therapy and/or chiropractic manipulation 7. Modalities (Heat, ice, etc.) 8.  Invasive techniques such as nerve blocks. Erica [name] has attained more than 50% relief of the pain from a series of diagnostic injections conducted in separate occasions.  Pain Score: Pre-procedure:  /10 Post-procedure:  /10  Pre-op Assessment:  Erica [name] is a 53 y.o. (year old), female patient, seen today for interventional treatment. She  has a past surgical history that includes Knee surgery (Left) and Flexible bronchoscopy (N/A, 06/20/2015). Erica [name] has a current medication list which includes the following prescription(s): albuterol, aspirin, atorvastatin, accu-chek aviva plus, buspirone, vitamin d3, diltiazem, diltiazem, duloxetine, duloxetine, famotidine, fluticasone-salmeterol, furosemide, losartan, metformin, oxycodone, oxycodone, oxycodone, oxygen-helium, pantoprazole, phosphorus, pregabalin, sucralfate, tiotropium, and tizanidine. Her primarily concern today is the No chief complaint on file.  Initial Vital Signs:  Pulse/HCG Rate:    Temp:   Resp:   BP:   SpO2:    BMI: Estimated body mass index is 30.41 kg/m as calculated from the following:   Height as of 10/11/18: 5\' 8"  (1.727 m).   Weight as of 10/11/18: 200 lb (90.7 kg).  Risk Assessment: Allergies: Reviewed. She is allergic to tramadol and penicillins.  Allergy Precautions: None required Coagulopathies: Reviewed. None identified.  Blood-thinner therapy: None at this time Active Infection(s): Reviewed. None identified. Erica [name] is afebrile  Site Confirmation: Erica [name] was asked to confirm the procedure and laterality before marking the site Procedure checklist: Completed Consent: Before the procedure and under the influence of no sedative(s), amnesic(s), or anxiolytics, the patient was informed of the treatment options, risks and possible complications. To fulfill our ethical and legal obligations, as recommended by the American Medical Association's Code of Ethics, I have informed the patient of my clinical  impression; the nature and purpose of  the treatment or procedure; the risks, benefits, and possible complications of the intervention; the alternatives, including doing nothing; the risk(s) and benefit(s) of the alternative treatment(s) or procedure(s); and the risk(s) and benefit(s) of doing nothing. The patient was provided information about the general risks and possible complications associated with the procedure. These may include, but are not limited to: failure to achieve desired goals, infection, bleeding, organ or nerve damage, allergic reactions, paralysis, and death. In addition, the patient was informed of those risks and complications associated to Spine-related procedures, such as failure to decrease pain; infection (i.e.: Meningitis, epidural or intraspinal abscess); bleeding (i.e.: epidural hematoma, subarachnoid hemorrhage, or any other type of intraspinal or peri-dural bleeding); organ or nerve damage (i.e.: Any type of peripheral nerve, nerve root, or spinal cord injury) with subsequent damage to sensory, motor, and/or autonomic systems, resulting in permanent pain, numbness, and/or weakness of one or several areas of the body; allergic reactions; (i.e.: anaphylactic reaction); and/or death. Furthermore, the patient was informed of those risks and complications associated with the medications. These include, but are not limited to: allergic reactions (i.e.: anaphylactic or anaphylactoid reaction(s)); adrenal axis suppression; blood sugar elevation that in diabetics may result in ketoacidosis or comma; water retention that in patients with history of congestive heart failure may result in shortness of breath, pulmonary edema, and decompensation with resultant heart failure; weight gain; swelling or edema; medication-induced neural toxicity; particulate matter embolism and blood vessel occlusion with resultant organ, and/or nervous system infarction; and/or aseptic necrosis of one or more  joints. Finally, the patient was informed that Medicine is not an exact science; therefore, there is also the possibility of unforeseen or unpredictable risks and/or possible complications that may result in a catastrophic outcome. The patient indicated having understood very clearly. We have given the patient no guarantees and we have made no promises. Enough time was given to the patient to ask questions, all of which were answered to the patient's satisfaction. Erica [name] has indicated that she wanted to continue with the procedure. Attestation: I, the ordering provider, attest that I have discussed with the patient the benefits, risks, side-effects, alternatives, likelihood of achieving goals, and potential problems during recovery for the procedure that I have provided informed consent. Date   Time: {CHL [facility]-PAIN TIME CHOICES:[id number]}  Pre-Procedure Preparation:  Monitoring: As per clinic protocol. Respiration, ETCO2, SpO2, BP, heart rate and rhythm monitor placed and checked for adequate function Safety Precautions: Patient was assessed for positional comfort and pressure points before starting the procedure. Time-out: I initiated and conducted the "Time-out" before starting the procedure, as per protocol. The patient was asked to participate by confirming the accuracy of the "Time Out" information. Verification of the correct person, site, and procedure were performed and confirmed by me, the nursing staff, and the patient. "Time-out" conducted as per Joint Commission's Universal Protocol (UP.01.01.01). Time:    Description of Procedure:          Laterality: Left Levels:  L2, L3, L4, L5, & S1 Medial Branch Level(s), at the L3-4, L4-5, and the L5-S1 lumbar facet joints. Area Prepped: Lumbosacral Prepping solution: DuraPrep (Iodine Povacrylex [0.7% available iodine] and Isopropyl Alcohol, 74% w/w) Safety Precautions: Aspiration looking for blood return was conducted prior to all injections. At  no point did we inject any substances, as a needle was being advanced. Before injecting, the patient was told to immediately notify me if she was experiencing any new onset of "ringing in the ears, or metallic taste in the  mouth". No attempts were made at seeking any paresthesias. Safe injection practices and needle disposal techniques used. Medications properly checked for expiration dates. SDV (single dose vial) medications used. After the completion of the procedure, all disposable equipment used was discarded in the proper designated medical waste containers. Local Anesthesia: Protocol guidelines were followed. The patient was positioned over the fluoroscopy table. The area was prepped in the usual manner. The time-out was completed. The target area was identified using fluoroscopy. A 12-in long, straight, sterile hemostat was used with fluoroscopic guidance to locate the targets for each level blocked. Once located, the skin was marked with an approved surgical skin marker. Once all sites were marked, the skin (epidermis, dermis, and hypodermis), as well as deeper tissues (fat, connective tissue and muscle) were infiltrated with a small amount of a short-acting local anesthetic, loaded on a 10cc syringe with a 25G, 1.5-in  Needle. An appropriate amount of time was allowed for local anesthetics to take effect before proceeding to the next step. Local Anesthetic: Lidocaine 2.0% The unused portion of the local anesthetic was discarded in the proper designated containers. Technical explanation of process:  Radiofrequency Ablation (RFA) L2 Medial Branch Nerve RFA: The target area for the L2 medial branch is at the junction of the postero-lateral aspect of the superior articular process and the superior, posterior, and medial edge of the transverse process of L3. Under fluoroscopic guidance, a Radiofrequency needle was inserted until contact was made with os over the superior postero-lateral aspect of the  pedicular shadow (target area). Sensory and motor testing was conducted to properly adjust the position of the needle. Once satisfactory placement of the needle was achieved, the numbing solution was slowly injected after negative aspiration for blood. 2.0 mL of the nerve block solution was injected without difficulty or complication. After waiting for at least 3 minutes, the ablation was performed. Once completed, the needle was removed intact. L3 Medial Branch Nerve RFA: The target area for the L3 medial branch is at the junction of the postero-lateral aspect of the superior articular process and the superior, posterior, and medial edge of the transverse process of L4. Under fluoroscopic guidance, a Radiofrequency needle was inserted until contact was made with os over the superior postero-lateral aspect of the pedicular shadow (target area). Sensory and motor testing was conducted to properly adjust the position of the needle. Once satisfactory placement of the needle was achieved, the numbing solution was slowly injected after negative aspiration for blood. 2.0 mL of the nerve block solution was injected without difficulty or complication. After waiting for at least 3 minutes, the ablation was performed. Once completed, the needle was removed intact. L4 Medial Branch Nerve RFA: The target area for the L4 medial branch is at the junction of the postero-lateral aspect of the superior articular process and the superior, posterior, and medial edge of the transverse process of L5. Under fluoroscopic guidance, a Radiofrequency needle was inserted until contact was made with os over the superior postero-lateral aspect of the pedicular shadow (target area). Sensory and motor testing was conducted to properly adjust the position of the needle. Once satisfactory placement of the needle was achieved, the numbing solution was slowly injected after negative aspiration for blood. 2.0 mL of the nerve block solution was  injected without difficulty or complication. After waiting for at least 3 minutes, the ablation was performed. Once completed, the needle was removed intact. L5 Medial Branch Nerve RFA: The target area for the L5 medial branch is  at the junction of the postero-lateral aspect of the superior articular process of S1 and the superior, posterior, and medial edge of the sacral ala. Under fluoroscopic guidance, a Radiofrequency needle was inserted until contact was made with os over the superior postero-lateral aspect of the pedicular shadow (target area). Sensory and motor testing was conducted to properly adjust the position of the needle. Once satisfactory placement of the needle was achieved, the numbing solution was slowly injected after negative aspiration for blood. 2.0 mL of the nerve block solution was injected without difficulty or complication. After waiting for at least 3 minutes, the ablation was performed. Once completed, the needle was removed intact. S1 Medial Branch Nerve RFA: The target area for the S1 medial branch is located inferior to the junction of the S1 superior articular process and the L5 inferior articular process, posterior, inferior, and lateral to the 6 o'clock position of the L5-S1 facet joint, just superior to the S1 posterior foramen. Under fluoroscopic guidance, the Radiofrequency needle was advanced until contact was made with os over the Target area. Sensory and motor testing was conducted to properly adjust the position of the needle. Once satisfactory placement of the needle was achieved, the numbing solution was slowly injected after negative aspiration for blood. 2.0 mL of the nerve block solution was injected without difficulty or complication. After waiting for at least 3 minutes, the ablation was performed. Once completed, the needle was removed intact. Radiofrequency lesioning (ablation):  Radiofrequency Generator: NeuroTherm [id number] Sensory Stimulation Parameters: 50 Hz  was used to locate & identify the nerve, making sure that the needle was positioned such that there was no sensory stimulation below 0.3 V or above 0.7 V. Motor Stimulation Parameters: 2 Hz was used to evaluate the motor component. Care was taken not to lesion any nerves that demonstrated motor stimulation of the lower extremities at an output of less than 2.5 times that of the sensory threshold, or a maximum of 2.0 V. Lesioning Technique Parameters: Standard Radiofrequency settings. (Not bipolar or pulsed.) Temperature Settings: 80 degrees C Lesioning time: 60 seconds Intra-operative Compliance: Compliant Materials & Medications: Needle(s) (Electrode/Cannula) Type: Teflon-coated, curved tip, Radiofrequency needle(s) Gauge: 22G Length: 10cm Numbing solution: 0.2% PF-Ropivacaine + Triamcinolone (40 mg/mL) diluted to a final concentration of 4 mg of Triamcinolone/mL of Ropivacaine The unused portion of the solution was discarded in the proper designated containers.  Once the entire procedure was completed, the treated area was cleaned, making sure to leave some of the prepping solution back to take advantage of its long term bactericidal properties.  Illustration of the posterior view of the lumbar spine and the posterior neural structures. Laminae of L2 through S1 are labeled. DPRL5, dorsal primary ramus of L5; DPRS1, dorsal primary ramus of S1; DPR3, dorsal primary ramus of L3; FJ, facet (zygapophyseal) joint L3-L4; I, inferior articular process of L4; LB1, lateral branch of dorsal primary ramus of L1; IAB, inferior articular branches from L3 medial branch (supplies L4-L5 facet joint); IBP, intermediate branch plexus; MB3, medial branch of dorsal primary ramus of L3; NR3, third lumbar nerve root; S, superior articular process of L5; SAB, superior articular branches from L4 (supplies L4-5 facet joint also); TP3, transverse process of L3.  There were no vitals filed for this visit.  Start Time:    hrs. End Time:   hrs.  Imaging Guidance (Spinal):          Type of Imaging Technique: Fluoroscopy Guidance (Spinal) Indication(s): Assistance in needle guidance and placement for  procedures requiring needle placement in or near specific anatomical locations not easily accessible without such assistance. Exposure Time: Please see nurses notes. Contrast: None used. Fluoroscopic Guidance: I was personally present during the use of fluoroscopy. "Tunnel Vision Technique" used to obtain the best possible view of the target area. Parallax error corrected before commencing the procedure. "Direction-depth-direction" technique used to introduce the needle under continuous pulsed fluoroscopy. Once target was reached, antero-posterior, oblique, and lateral fluoroscopic projection used confirm needle placement in all planes. Images permanently stored in EMR. Interpretation: No contrast injected. I personally interpreted the imaging intraoperatively. Adequate needle placement confirmed in multiple planes. Permanent images saved into the patient's record.  Antibiotic Prophylaxis:   Anti-infectives (From admission, onward)   None     Indication(s): None identified  Post-operative Assessment:  Post-procedure Vital Signs:  Pulse/HCG Rate:    Temp:   Resp:   BP:   SpO2:    EBL: None  Complications: No immediate post-treatment complications observed by team, or reported by patient.  Note: The patient tolerated the entire procedure well. A repeat set of vitals were taken after the procedure and the patient was kept under observation following institutional policy, for this type of procedure. Post-procedural neurological assessment was performed, showing return to baseline, prior to discharge. The patient was provided with post-procedure discharge instructions, including a section on how to identify potential problems. Should any problems arise concerning this procedure, the patient was given instructions  to immediately contact us, at any time, without hesitation. In any case, we plan to contact the patient by telephone for a follow-up status report regarding this interventional procedure.  Comments:  No additional relevant information.  Plan of Care  Orders:  No orders of the defined types were placed in this encounter.  Chronic Opioid Analgesic:  Oxycodone IR 5 mg 1 tablet by mouth every 8 hours (15mg /dayof oxycodone) MME/day:22.5mg /day.   Medications ordered for procedure: No orders of the defined types were placed in this encounter.  Medications administered: Lupita LeashDonna A. [name] had no medications administered during this visit.  See the medical record for exact dosing, route, and time of administration.  Follow-up plan:   No follow-ups on file.       Interventional options: Considering:   Therapeutic right lumbar facet RFA#1  Possible bilateral lumbar facet RFA Possible bilateral diagnostic sacroiliac joint block Possible bilateral intra-articular hip joint injection Possible bilateral intra-articular knee joint injection  Possible bilateral genicular nerve block Possible bilateral knee joint RFA  Possible left-sided LESI  Possible right-sided CESI  Possible bilateral diagnostic cervical facet block  Possible bilateral diagnostic GONB  Possible greater occipital nerve RFA    Palliative PRN treatment(s):   Palliative bilateral lumbar facet blocks  Palliative bilateral intra-articular Hyalgan knee injection #S2/N1 (last done on 11/08/2017)  Palliative left lumbar facet RFA #2 (last done 03/22/2019)      Recent Visits Date Type Provider Dept  02/20/19 Office Visit Delano MetzNaveira, Francille Wittmann, MD [facility]  Showing recent visits within past 90 days and meeting all other requirements   Today's Visits Date Type Provider Dept  03/22/19 Appointment Delano MetzNaveira, Ludmilla Mcgillis, MD [facility]  Showing today's visits and meeting all other requirements   Future  Appointments No visits were found meeting these conditions.  Showing future appointments within next 90 days and meeting all other requirements   Disposition: Discharge home  Discharge Date & Time: 03/22/2019;   hrs.   Primary Care Physician: Hillery AldoPatel, Sarah, MD [facility]  Note by: Oswaldo Done, MD Date: 03/22/2019; Time: 6:52 AM  Disclaimer:  Medicine is not an Visual merchandiser. The only guarantee in medicine is that nothing is guaranteed. It is important to note that the decision to proceed with this intervention was based on the information collected from the patient. The Data and conclusions were drawn from the patient's questionnaire, the interview, and the physical examination. Because the information was provided in large part by the patient, it cannot be guaranteed that it has not been purposely or unconsciously manipulated. Every effort has been made to obtain as much relevant data as possible for this evaluation. It is important to note that the conclusions that lead to this procedure are derived in large part from the available data. Always take into account that the treatment will also be dependent on availability of resources and existing treatment guidelines, considered by other Pain Management Practitioners as being common knowledge and practice, at the time of the intervention. For Medico-Legal purposes, it is also important to point out that variation in procedural techniques and pharmacological choices are the acceptable norm. The indications, contraindications, technique, and results of the above procedure should only be interpreted and judged by a Board-Certified Interventional Pain Specialist with extensive familiarity and expertise in the same exact procedure and technique.

## 2019-04-05 IMAGING — CT CT ANGIO CHEST
2 of 6 series · 18 of 46 positions shown · IV contrast (APPLIED)
Comparison: Radiographs September 10, 2018. CT scan [DATE]

CLINICAL DATA: Chest pain.

EXAM:
CT ANGIOGRAPHY CHEST WITH CONTRAST
TECHNIQUE: Multidetector CT imaging of the chest was performed using the
standard protocol during bolus administration of intravenous
contrast. Multiplanar CT image reconstructions and MIPs were
obtained to evaluate the vascular anatomy.
CONTRAST:  75mL OMNIPAQUE IOHEXOL 350 MG/ML SOLN

[Series 5: thins · axial · 0.63mm/px · z∈[-307,-62]mm · 15 of 269 slices shown]
[im 12/269  lung]
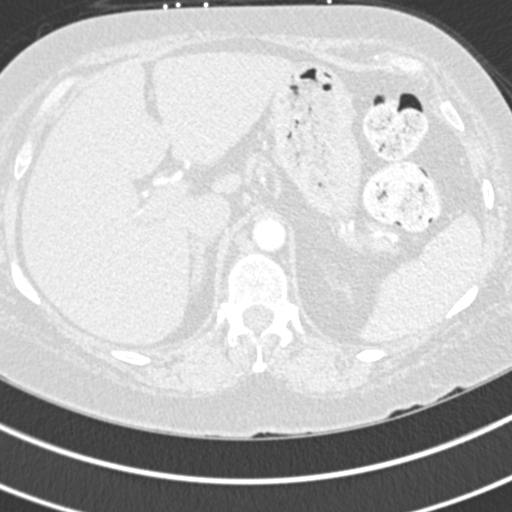
[im 35/269  soft-tissue]
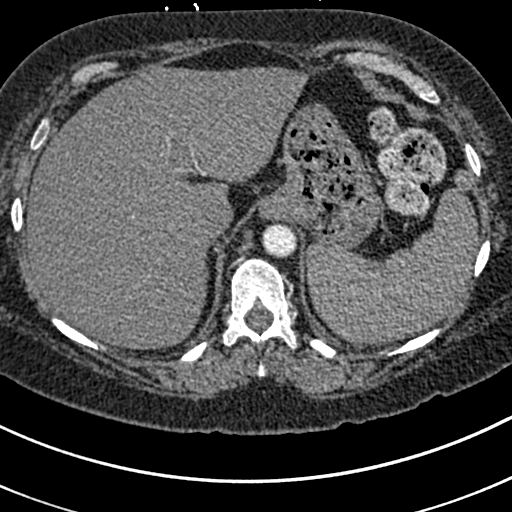
[im 47/269  lung]
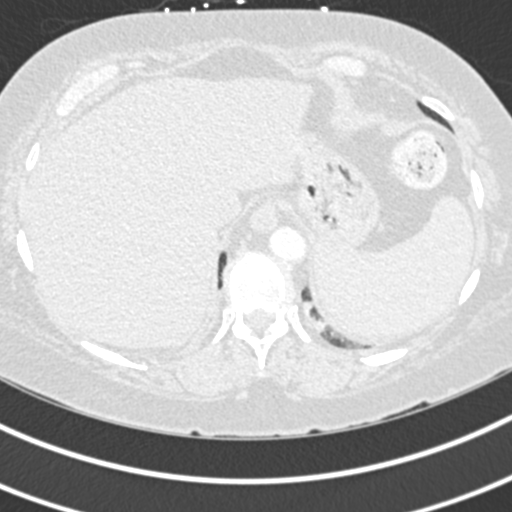
[im 70/269  soft-tissue]
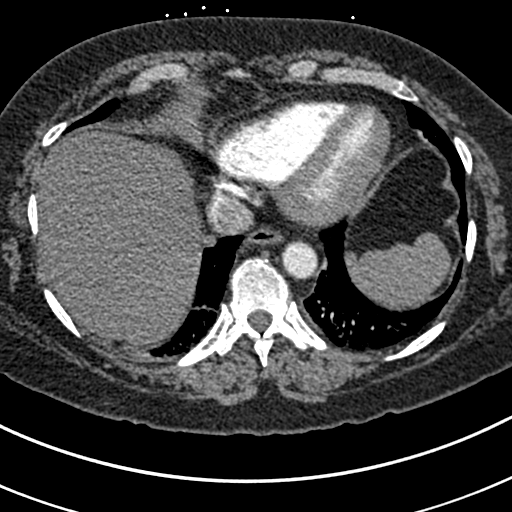
[im 82/269  lung]
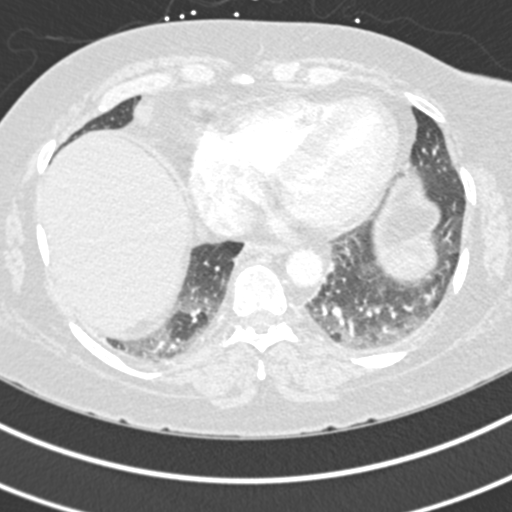
[im 105/269  soft-tissue]
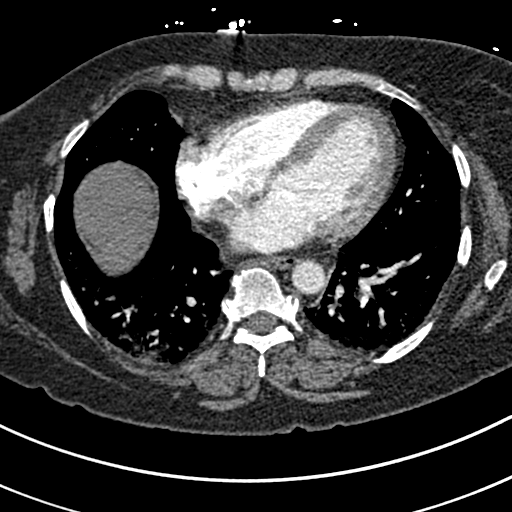
[im 117/269  lung]
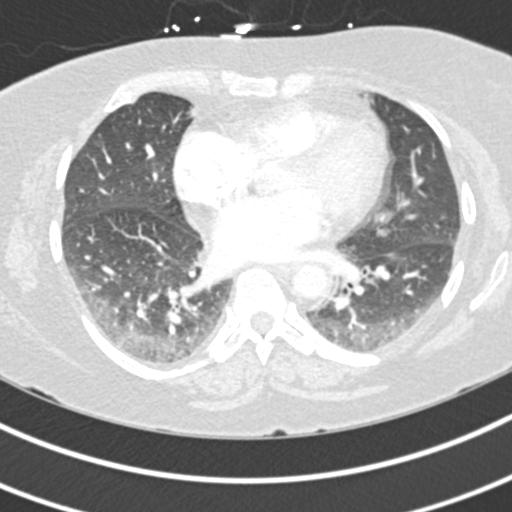
[im 140/269  soft-tissue]
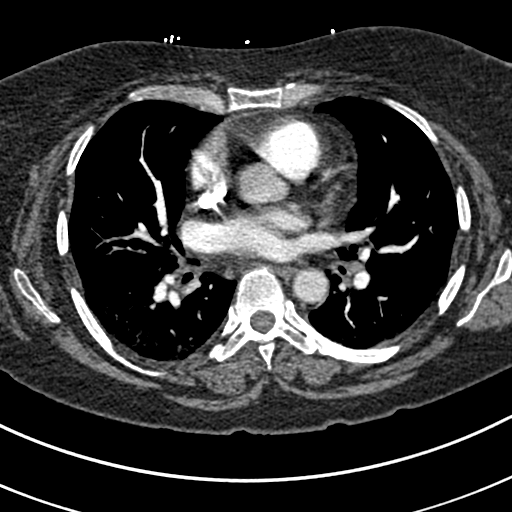
[im 152/269  lung]
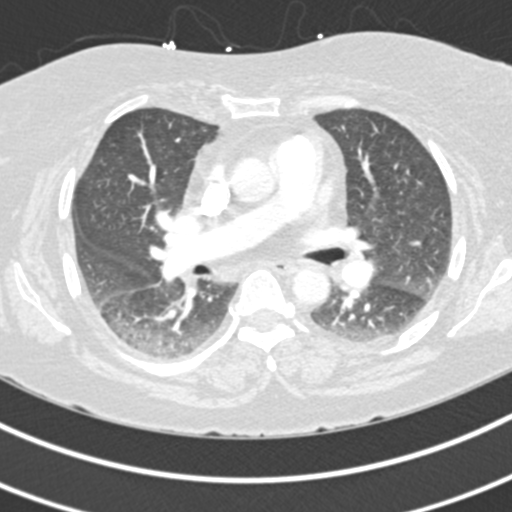
[im 164/269  soft-tissue]
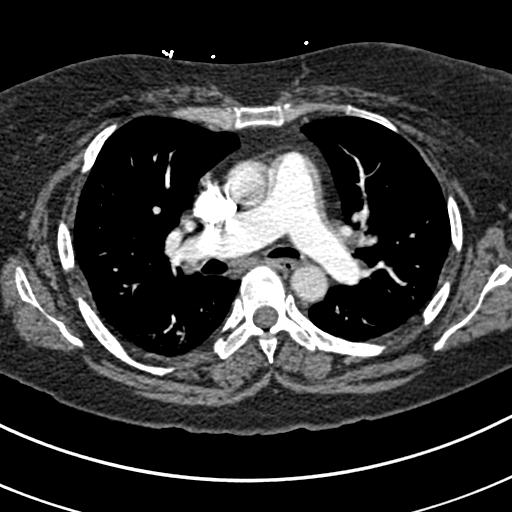
[im 187/269  lung]
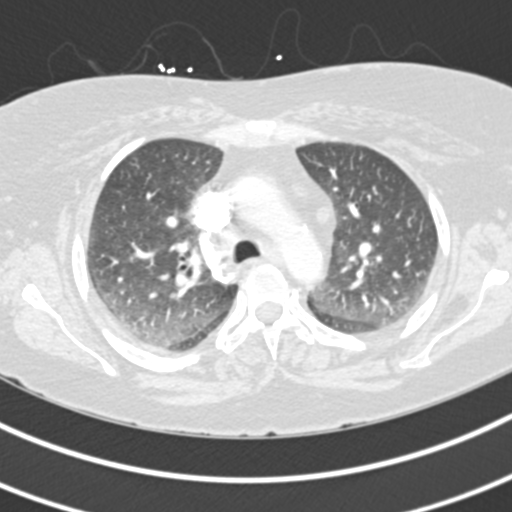
[im 199/269  soft-tissue]
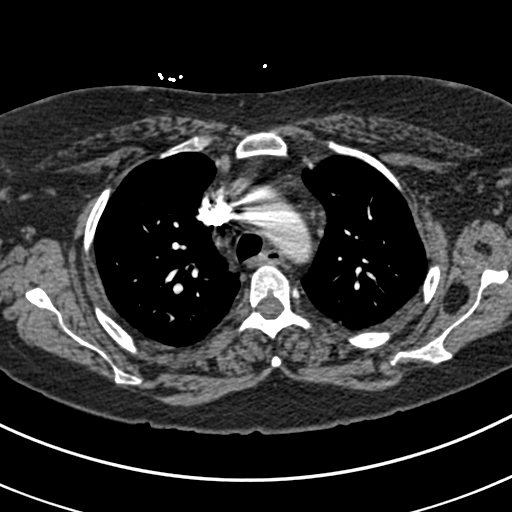
[im 222/269  lung]
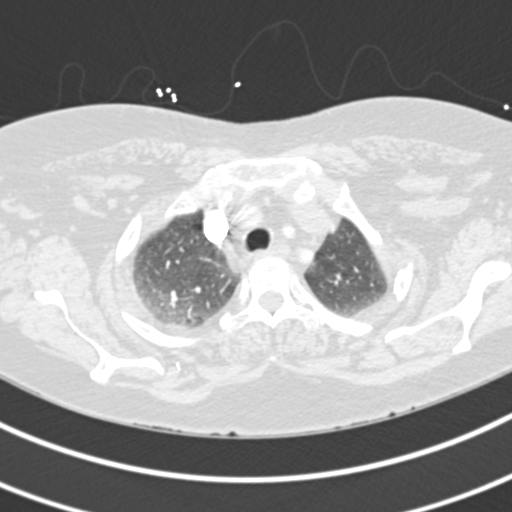
[im 234/269  soft-tissue]
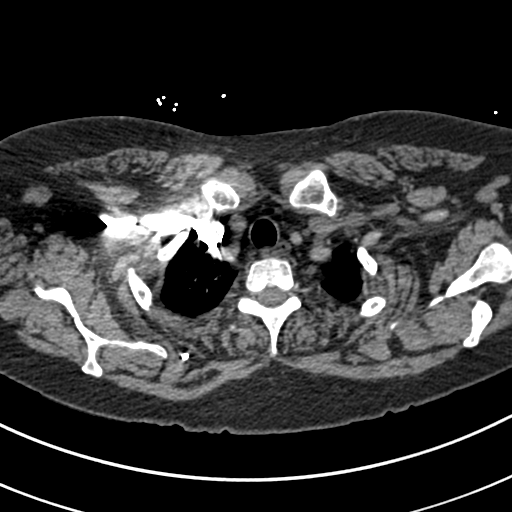
[im 257/269  lung]
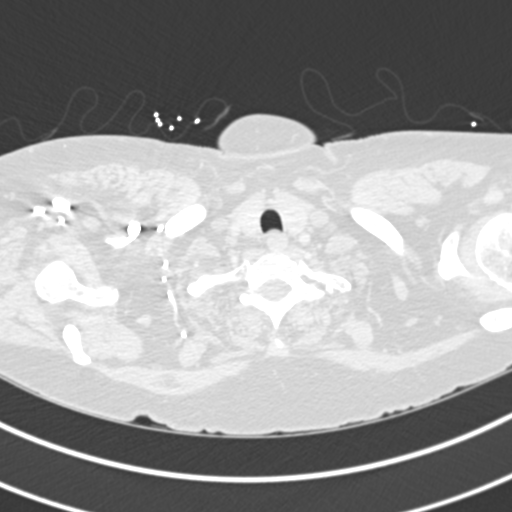

[Series 7: coronal mpr · coronal · 0.54mm/px · 3 of 79 slices shown]
[im 20/79  soft-tissue]
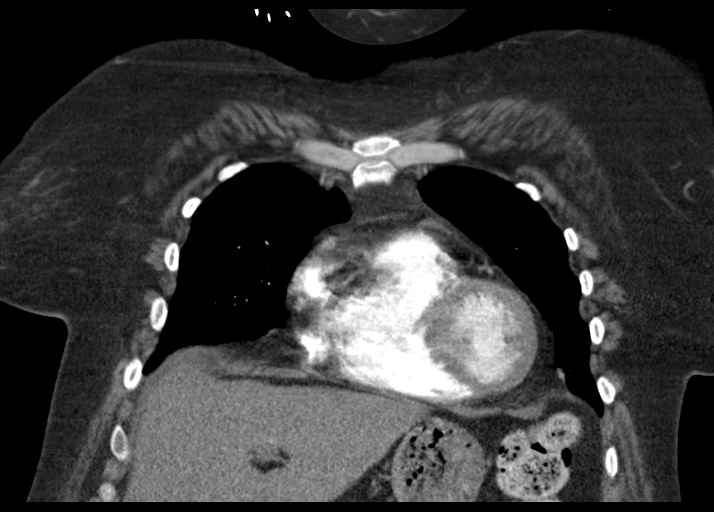
[im 40/79  soft-tissue]
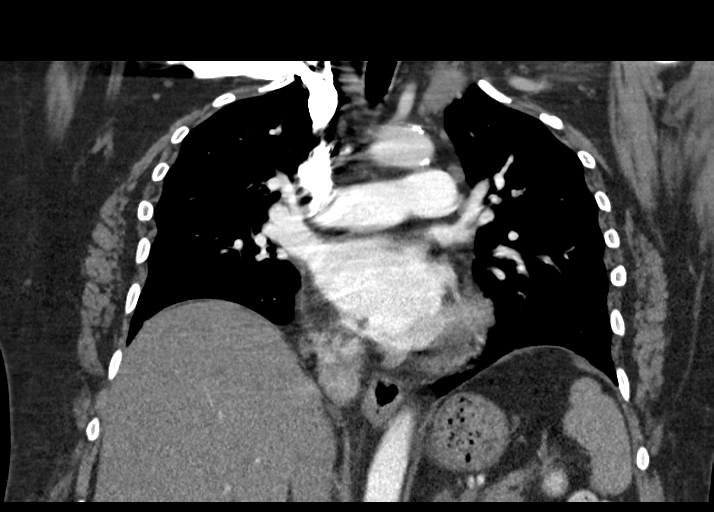
[im 59/79  soft-tissue]
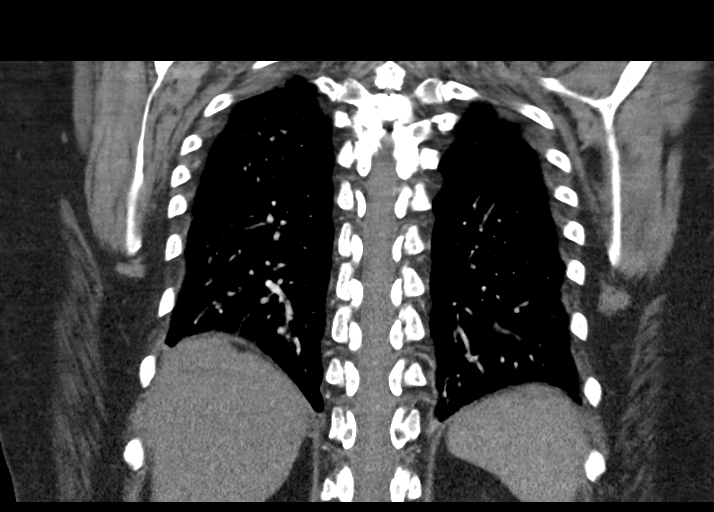

[18 of 46 positions shown; findings below may reference images not displayed]

FINDINGS: Cardiovascular: Satisfactory opacification of the pulmonary arteries
to the segmental level. No evidence of pulmonary embolism. Normal
heart size. No pericardial effusion. Atherosclerosis of thoracic
aorta is noted without aneurysm or dissection.

Mediastinum/Nodes: No enlarged mediastinal, hilar, or axillary lymph
nodes. Thyroid gland, trachea, and esophagus demonstrate no
significant findings.

Lungs/Pleura: Stable 5 mm subpleural nodule is seen in left lower
lobe; this can be considered benign at this point with no further
follow-up required. No pneumothorax or pleural effusion is noted.
Mild bilateral posterior basilar subsegmental atelectasis is noted.
Right lung is unremarkable.

Upper Abdomen: No acute abnormality.

Musculoskeletal: No chest wall abnormality. No acute or significant
osseous findings.

Review of the MIP images confirms the above findings.
IMPRESSION: No definite evidence of pulmonary embolus.

Aortic Atherosclerosis ([id number]).

## 2019-05-23 ENCOUNTER — Telehealth: Payer: Self-pay | Admitting: Pain Medicine

## 2019-05-23 ENCOUNTER — Encounter: Payer: Self-pay | Admitting: Pain Medicine

## 2019-05-23 MED ORDER — TIZANIDINE HCL 4 MG PO CAPS: 4.0000 mg | ORAL_CAPSULE | Freq: Three times a day (TID) | ORAL | 5 refills | Status: DC | PRN

## 2019-05-23 MED ORDER — PREGABALIN 100 MG PO CAPS: 100.0000 mg | ORAL_CAPSULE | Freq: Three times a day (TID) | ORAL | 0 refills | Status: DC

## 2019-05-23 MED ORDER — OXYCODONE HCL 5 MG PO TABS: 5.0000 mg | ORAL_TABLET | Freq: Four times a day (QID) | ORAL | 0 refills | Status: DC | PRN

## 2019-05-23 MED ORDER — PREGABALIN 150 MG PO CAPS: 150.0000 mg | ORAL_CAPSULE | Freq: Three times a day (TID) | ORAL | 4 refills | Status: DC

## 2019-05-23 MED ORDER — VITAMIN D3 50 MCG (2000 UT) PO CAPS: 2000.0000 [IU] | ORAL_CAPSULE | Freq: Every day | ORAL | 3 refills | Status: DC

## 2019-05-23 MED ORDER — OXYCODONE HCL 5 MG PO TABS: 5.0000 mg | ORAL_TABLET | Freq: Three times a day (TID) | ORAL | 0 refills | Status: DC | PRN

## 2019-05-23 NOTE — Telephone Encounter (Signed)
Erica [name] did not get an appt for med refill so I added to Thurs schedule for Dr. Dossie Arbour last patient of the day. She is totally out of meds. She will need Nurse call to update chart please.

## 2019-05-23 NOTE — Patient Instructions (Signed)

## 2019-05-23 NOTE — Progress Notes (Signed)
Pain Management Virtual Encounter Note - Virtual Visit via Telephone Telehealth (real-time audio visits between healthcare provider and patient).   Patient's Phone No. & Preferred Pharmacy:  [phone] (home); [phone] (mobile); (Preferred) [phone] [email or web address] [phone] Nicholes Rough, [place] - [address] AT [facility] [address] [place] [address] Phone: [phone] Fax: [phone]    Pre-screening note:  Our staff contacted Erica [name] and offered her an "in person", "face-to-face" appointment versus a telephone encounter. She indicated preferring the telephone encounter, at this time.   Reason for Virtual Visit: COVID-19*  Social distancing based on CDC and AMA recommendations.   I contacted [name] on 05/24/2019 via telephone.      I clearly identified myself as Erica Done, MD. I verified that I was speaking with the correct person using two identifiers (Name: ADONAI HELZER, and date of birth: 1965-12-15).  Advanced Informed Consent I sought verbal advanced consent from [name] for virtual visit interactions. I informed Ms. Milsap of possible security and privacy concerns, risks, and limitations associated with providing "not-in-person" medical evaluation and management services. I also informed Ms. Leuthold of the availability of "in-person" appointments. Finally, I informed her that there would be a charge for the virtual visit and that she could be  personally, fully or partially, financially responsible for it. Ms. Sandiford expressed understanding and agreed to proceed.   Historic Elements   Erica [name] is a 53 y.o. year old, female patient evaluated today after her last encounter by our practice on 05/23/2019. Ms. Oatley  has a past medical history of (HFpEF) heart failure with preserved ejection fraction [facility]), Acute pancreatitis (08/13/2018), Arthritis, Asthma, Bell's palsy, Bronchitis, CHF (congestive heart  failure) (HCC), COPD (chronic obstructive pulmonary disease) (HCC), Diabetes mellitus without complication (HCC), Gastric ulcer, Hyperlipidemia, Hypertension, OSA (obstructive sleep apnea), Pancreatitis, and Shoulder injury. She also  has a past surgical history that includes Knee surgery (Left) and Flexible bronchoscopy (N/A, 06/20/2015). [name] has a current medication list which includes the following prescription(s): albuterol, aspirin, atorvastatin, accu-chek aviva plus, buspirone, vitamin d3, diltiazem, diltiazem, duloxetine, duloxetine, fluticasone-salmeterol, furosemide, metformin, oxygen-helium, pantoprazole, sucralfate, tiotropium, famotidine, oxycodone, oxycodone, oxycodone, pregabalin, pregabalin, and tizanidine. She  reports that she quit smoking about 3 years ago. Her smoking use included cigarettes. She has a 36.00 pack-year smoking history. She has never used smokeless tobacco. She reports that she does not drink alcohol or use drugs. Ms. Storck is allergic to tramadol and penicillins.   HPI  Today, she is being contacted for medication management. The patient indicates doing well with the current medication regimen. No adverse reactions or side effects reported to the medications.  Unfortunately, some of her neuropathic pain has been getting worse and she indicates that the Lyrica 75 mg p.o. 3 times daily is not able to control the pain as well as it used to.  Because she is still not at maximum doses, I have agreed to increase it to 100 mg p.o. 3 times daily and if tolerated, then will move it to 150 mg p.o. 3 times daily for a maximum of 450 mg/day.  In addition, the patient has indicated having more pain in the area of the knees and I have offered to also do some injections for her, but she indicates that she is pending to see the orthopedic surgeons.  She has requested that I increase her pain medication, at least for a short period of time until she  can get this pain under control.  She  is scheduled to follow-up with me to complete a radiofrequency of the lumbar facets on 06/19/2019.  I have encouraged the patient to make sure not miss this appointment so that we can get the pain under better control.   I have agreed to increase her oxycodone IR 5 mg to 4 times daily for 30 days after which we will go back to the every 8 hours.  Note: This appointment was initially scheduled for 05/24/2019, but noticing that the patient had already ran out of pain medication, I called her on 05/23/2019 at 5:00 PM and asked her if she had some available time for the encounter and if she was interested in proceeding with it today (05/23/2019).  She indicated that she wanted to go ahead with it since she needed some of her medications refilled.  Pharmacotherapy Assessment  Analgesic: Oxycodone IR 5 mg, 1 tab PO q 8 hrs (15mg /dayof oxycodone) MME/day:22.5mg /day.   Monitoring: Pharmacotherapy: No side-effects or adverse reactions reported. [place] PMP: PDMP reviewed during this encounter.       Compliance: No problems identified. Effectiveness: Clinically acceptable. Plan: Refer to "POC".  UDS:  Summary  Date Value Ref Range Status  08/29/2018 FINAL  Final    Comment:    ==================================================================== TOXASSURE SELECT 13 (MW) ==================================================================== Test                             Result       Flag       Units   NO DRUGS DETECTED. ==================================================================== Test                      Result    Flag   Units      Ref Range   Creatinine              167              mg/dL      10/28/2018 ==================================================================== Declared Medications:  The flagging and interpretation on this report are based on the  following declared medications.  Unexpected results may arise from  inaccuracies in the declared medications.  **Note: The testing scope  of this panel does not include following  reported medications:  Albuterol  Aspirin  Atorvastatin  Calcium  Cholecalciferol  Diltiazem  Duloxetine  Fluconazole  Fluticasone  Folic acid ==================================================================== For clinical consultation, please call [phone]. ====================================================================    Laboratory Chemistry Profile (12 mo)  Renal: 09/12/2018: BUN 7; Creatinine, Ser 0.56  Lab Results  Component Value Date   GFRAA >60 09/12/2018   GFRNONAA >60 09/12/2018   Hepatic: 09/10/2018: Albumin 3.5 Lab Results  Component Value Date   AST 35 09/10/2018   ALT 32 09/10/2018   Other: No results found for requested labs within last 8760 hours. Note: Above Lab results reviewed.  Imaging  Last 90 days:  No results found.  Assessment  The primary encounter diagnosis was Chronic pain syndrome. Diagnoses of Chronic low back pain (Primary Area of Pain) (Bilateral) (L>R), Chronic hip pain (Secondary area of Pain) (Bilateral) (L>R), Chronic knee pain (Third area of Pain) (Bilateral) (L>R), Intermittent left thoracic Muscle cramps, Spasm of thoracolumbar muscle (Left), Chronic musculoskeletal pain, Peripheral neuropathy, idiopathic (upper and lower extremity), Neurogenic pain, and Vitamin D deficiency were also pertinent to this visit.  Plan of Care  I have discontinued Lanell A. Westervelt's phosphorus, losartan, oxyCODONE,  and oxyCODONE. I have also changed her oxyCODONE, pregabalin, and Vitamin D3. Additionally, I am having her start on pregabalin, oxyCODONE, and oxyCODONE. Lastly, I am having her maintain her aspirin, sucralfate, tiotropium, albuterol, DULoxetine, OXYGEN, Accu-Chek Aviva Plus, DULoxetine, metFORMIN, pantoprazole, Fluticasone-Salmeterol, famotidine, atorvastatin, diltiazem, diltiazem, furosemide, busPIRone, and tiZANidine.  Pharmacotherapy (Medications Ordered): Meds ordered this encounter   Medications  . tiZANidine (ZANAFLEX) 4 MG capsule    Sig: Take 1 capsule (4 mg total) by mouth 3 (three) times daily as needed for muscle spasms.    Dispense:  90 capsule    Refill:  5    Fill one day early if pharmacy is closed on scheduled refill date. May substitute for generic if available.  Marland Kitchen. oxyCODONE (OXY IR/ROXICODONE) 5 MG immediate release tablet    Sig: Take 1 tablet (5 mg total) by mouth every 6 (six) hours as needed for severe pain. Must last 30 days    Dispense:  120 tablet    Refill:  0    Chronic Pain: STOP Act (Not applicable) Fill 1 day early if closed on refill date. Do not fill until: 05/23/2019. To last until: 06/22/2019. Avoid benzodiazepines within 8 hours of opioids  . pregabalin (LYRICA) 100 MG capsule    Sig: Take 1 capsule (100 mg total) by mouth 3 (three) times daily.    Dispense:  90 capsule    Refill:  0    Fill one day early if pharmacy is closed on scheduled refill date. May substitute for generic if available.  . Cholecalciferol (VITAMIN D3) 50 MCG (2000 UT) capsule    Sig: Take 1 capsule (2,000 Units total) by mouth daily. Take 1 capsule (2,000 Units total) by mouth daily.    Dispense:  90 capsule    Refill:  3    Fill one day early if pharmacy is closed on scheduled refill date. May substitute for generic if available.  . pregabalin (LYRICA) 150 MG capsule    Sig: Take 1 capsule (150 mg total) by mouth 3 (three) times daily.    Dispense:  90 capsule    Refill:  4    Fill one day early if pharmacy is closed on scheduled refill date. May substitute for generic if available.  Marland Kitchen. oxyCODONE (OXY IR/ROXICODONE) 5 MG immediate release tablet    Sig: Take 1 tablet (5 mg total) by mouth every 8 (eight) hours as needed for severe pain. Must last 30 days    Dispense:  90 tablet    Refill:  0    Chronic Pain: STOP Act (Not applicable) Fill 1 day early if closed on refill date. Do not fill until: 06/22/2019. To last until: 07/22/2019. Avoid benzodiazepines within 8  hours of opioids  . oxyCODONE (OXY IR/ROXICODONE) 5 MG immediate release tablet    Sig: Take 1 tablet (5 mg total) by mouth every 8 (eight) hours as needed for severe pain. Must last 30 days    Dispense:  90 tablet    Refill:  0    Chronic Pain: STOP Act (Not applicable) Fill 1 day early if closed on refill date. Do not fill until: 07/22/2019. To last until: 08/21/2019. Avoid benzodiazepines within 8 hours of opioids   Orders:  No orders of the defined types were placed in this encounter.  Follow-up plan:   Return in about 3 months (around 08/20/2019) for (VV), (MM), pending RFA (w/ sedation): (L) L-FCT RFA on 06/19/2019.Marland Kitchen.      Interventional options: Considering:  Therapeutic bilateral lumbar facet RFA#1 (starting with the left side) Possible bilateral lumbar facet RFA Possible bilateral diagnostic sacroiliac joint block Possible bilateral intra-articular hip joint injection Possible bilateral intra-articular knee joint injection  Possible bilateral genicular nerve block Possible bilateral knee joint RFA  Possible left-sided LESI  Possible right-sided CESI  Possible bilateral diagnostic cervical facet block  Possible bilateral diagnostic GONB  Possible greater occipital nerve RFA    Palliative PRN treatment(s):   Palliative bilateral lumbar facet blocks  Palliative bilateral intra-articular Hyalgan knee injection #S2/N1 (last [name] on 11/08/2017)    Recent Visits No visits were found meeting these conditions.  Showing recent visits within past 90 days and meeting all other requirements   Today's Visits Date Type Provider Dept  05/24/19 Office Visit Milinda Pointer, MD [facility]  Showing today's visits and meeting all other requirements   Future Appointments Date Type Provider Dept  06/19/19 Appointment Milinda Pointer, MD [facility]  Showing future appointments within next 90 days and meeting all other requirements   I discussed the assessment  and treatment plan with the patient. The patient was provided an opportunity to ask questions and all were answered. The patient agreed with the plan and demonstrated an understanding of the instructions.  Patient advised to call back or seek an in-person evaluation if the symptoms or condition worsens.  Total duration of non-face-to-face encounter: 18 minutes.  Note by: Gaspar Cola, MD Date: 05/24/2019; Time: 7:54 AM  Note: This dictation was prepared with Dragon dictation. Any transcriptional errors that may result from this process are unintentional.  Disclaimer:  * Given the special circumstances of the COVID-19 pandemic, the federal government has announced that the Office for Civil Rights (OCR) will exercise its enforcement discretion and will not impose penalties on physicians using telehealth in the event of noncompliance with regulatory requirements under the [place] (HIPAA) in connection with the good faith provision of telehealth during the LNLGX-21 national public health emergency. ([place])

## 2019-05-24 ENCOUNTER — Other Ambulatory Visit: Payer: Self-pay

## 2019-05-24 ENCOUNTER — Ambulatory Visit: Payer: Medicaid Other | Attending: Pain Medicine | Admitting: Pain Medicine

## 2019-05-24 ENCOUNTER — Telehealth: Payer: Self-pay | Admitting: Pain Medicine

## 2019-05-24 DIAGNOSIS — R252 Cramp and spasm: Secondary | ICD-10-CM

## 2019-05-24 DIAGNOSIS — M545 Low back pain, unspecified: Secondary | ICD-10-CM

## 2019-05-24 DIAGNOSIS — M62830 Muscle spasm of back: Secondary | ICD-10-CM

## 2019-05-24 DIAGNOSIS — E559 Vitamin D deficiency, unspecified: Secondary | ICD-10-CM

## 2019-05-24 DIAGNOSIS — G894 Chronic pain syndrome: Secondary | ICD-10-CM | POA: Diagnosis not present

## 2019-05-24 DIAGNOSIS — M25551 Pain in right hip: Secondary | ICD-10-CM

## 2019-05-24 DIAGNOSIS — G609 Hereditary and idiopathic neuropathy, unspecified: Secondary | ICD-10-CM

## 2019-05-24 DIAGNOSIS — M25562 Pain in left knee: Secondary | ICD-10-CM

## 2019-05-24 DIAGNOSIS — M25552 Pain in left hip: Secondary | ICD-10-CM

## 2019-05-24 DIAGNOSIS — G8929 Other chronic pain: Secondary | ICD-10-CM

## 2019-05-24 DIAGNOSIS — M25561 Pain in right knee: Secondary | ICD-10-CM

## 2019-05-24 DIAGNOSIS — M7918 Myalgia, other site: Secondary | ICD-10-CM

## 2019-05-24 DIAGNOSIS — M792 Neuralgia and neuritis, unspecified: Secondary | ICD-10-CM

## 2019-05-24 NOTE — Telephone Encounter (Signed)
Talked with pated about her question about Lyrica. Instructed her on the way to increase it, per Dr Dossie Arbour note. She was also inquiring about PA for oxy. Told her we would send one in today. Patient with understanding.

## 2019-05-24 NOTE — Telephone Encounter (Signed)
Has some questions about the Lyrica. Please call patient

## 2019-06-19 ENCOUNTER — Ambulatory Visit: Payer: Medicaid Other | Admitting: Pain Medicine

## 2019-06-19 NOTE — Progress Notes (Deleted)
Patient's Name: Erica [name]  MRN: [id number]  Referring Provider: Delano Metz, MD  DOB: Nov 28, 1965  PCP: Hillery Aldo, MD  DOS: 06/19/2019  Note by: Oswaldo Done, MD  Service setting: Ambulatory outpatient  Specialty: Interventional Pain Management  Patient type: Established  Location: [facility] (AMB) Pain Management Facility  Visit type: Interventional Procedure   Primary Reason for Visit: Interventional Pain Management Treatment. CC: No chief complaint on file.  Procedure:          Anesthesia, Analgesia, Anxiolysis:  Type: Thermal Lumbar Facet, Medial Branch Radiofrequency Ablation/Neurotomy           Primary Purpose: Therapeutic Region: Posterolateral Lumbosacral Spine Level: L2, L3, L4, L5, & S1 Medial Branch Level(s). These levels will denervate the L3-4, L4-5, and the L5-S1 lumbar facet joints. Laterality: Left  Type: Moderate (Conscious) Sedation combined with Local Anesthesia Indication(s): Analgesia and Anxiety Route: Intravenous (IV) IV Access: Secured Sedation: Meaningful verbal contact was maintained at all times during the procedure  Local Anesthetic: Lidocaine 1-2%  Position: Prone   Indications: 1. Spondylosis without myelopathy or radiculopathy, lumbosacral region   2. Lumbar facet syndrome (Bilateral) (R>L)   3. Osteoarthritis of facet joint of lumbar spine   4. DDD (degenerative disc disease), lumbosacral   5. Chronic low back pain (Primary Area of Pain) (Bilateral) (L>R)   6. Chronic hip pain (Secondary area of Pain) (Bilateral) (L>R)    Ms. Stetzel has been dealing with the above chronic pain for longer than three months and has either failed to respond, was unable to tolerate, or simply did not get enough benefit from other more conservative therapies including, but not limited to: 1. Over-the-counter medications 2. Anti-inflammatory medications 3. Muscle relaxants 4. Membrane stabilizers 5. Opioids 6. Physical therapy and/or chiropractic  manipulation 7. Modalities (Heat, ice, etc.) 8. Invasive techniques such as nerve blocks. Ms. Tourangeau has attained more than 50% relief of the pain from a series of diagnostic injections conducted in separate occasions.  Pain Score: Pre-procedure:  /10 Post-procedure:  /10  Pre-op Assessment:  Ms. Cowger is a 53 y.o. (year old), female patient, seen today for interventional treatment. She  has a past surgical history that includes Knee surgery (Left) and Flexible bronchoscopy (N/A, 06/20/2015). Ms. Plack has a current medication list which includes the following prescription(s): albuterol, aspirin, atorvastatin, accu-chek aviva plus, buspirone, vitamin d3, diltiazem, diltiazem, duloxetine, duloxetine, fluticasone-salmeterol, furosemide, metformin, oxycodone, oxycodone, oxycodone, oxygen-helium, pantoprazole, pregabalin, pregabalin, sucralfate, tiotropium, and tizanidine. Her primarily concern today is the No chief complaint on file.  Initial Vital Signs:  Pulse/HCG Rate:    Temp:   Resp:   BP:   SpO2:    BMI: Estimated body mass index is 30.41 kg/m as calculated from the following:   Height as of 10/11/18: 5\' 8"  (1.727 m).   Weight as of 10/11/18: 200 lb (90.7 kg).  Risk Assessment: Allergies: Reviewed. She is allergic to tramadol and penicillins.  Allergy Precautions: None required Coagulopathies: Reviewed. None identified.  Blood-thinner therapy: None at this time Active Infection(s): Reviewed. None identified. Ms. Rome is afebrile  Site Confirmation: Ms. Martell was asked to confirm the procedure and laterality before marking the site Procedure checklist: Completed Consent: Before the procedure and under the influence of no sedative(s), amnesic(s), or anxiolytics, the patient was informed of the treatment options, risks and possible complications. To fulfill our ethical and legal obligations, as recommended by the American Medical Association's Code of Ethics, I have informed the  patient of my clinical impression;  the nature and purpose of the treatment or procedure; the risks, benefits, and possible complications of the intervention; the alternatives, including doing nothing; the risk(s) and benefit(s) of the alternative treatment(s) or procedure(s); and the risk(s) and benefit(s) of doing nothing. The patient was provided information about the general risks and possible complications associated with the procedure. These may include, but are not limited to: failure to achieve desired goals, infection, bleeding, organ or nerve damage, allergic reactions, paralysis, and death. In addition, the patient was informed of those risks and complications associated to Spine-related procedures, such as failure to decrease pain; infection (i.e.: Meningitis, epidural or intraspinal abscess); bleeding (i.e.: epidural hematoma, subarachnoid hemorrhage, or any other type of intraspinal or peri-dural bleeding); organ or nerve damage (i.e.: Any type of peripheral nerve, nerve root, or spinal cord injury) with subsequent damage to sensory, motor, and/or autonomic systems, resulting in permanent pain, numbness, and/or weakness of one or several areas of the body; allergic reactions; (i.e.: anaphylactic reaction); and/or death. Furthermore, the patient was informed of those risks and complications associated with the medications. These include, but are not limited to: allergic reactions (i.e.: anaphylactic or anaphylactoid reaction(s)); adrenal axis suppression; blood sugar elevation that in diabetics may result in ketoacidosis or comma; water retention that in patients with history of congestive heart failure may result in shortness of breath, pulmonary edema, and decompensation with resultant heart failure; weight gain; swelling or edema; medication-induced neural toxicity; particulate matter embolism and blood vessel occlusion with resultant organ, and/or nervous system infarction; and/or aseptic necrosis  of one or more joints. Finally, the patient was informed that Medicine is not an exact science; therefore, there is also the possibility of unforeseen or unpredictable risks and/or possible complications that may result in a catastrophic outcome. The patient indicated having understood very clearly. We have given the patient no guarantees and we have made no promises. Enough time was given to the patient to ask questions, all of which were answered to the patient's satisfaction. Ms. Bernet has indicated that she wanted to continue with the procedure. Attestation: I, the ordering provider, attest that I have discussed with the patient the benefits, risks, side-effects, alternatives, likelihood of achieving goals, and potential problems during recovery for the procedure that I have provided informed consent. Date   Time: {CHL [facility]-PAIN TIME CHOICES:[id number]}  Pre-Procedure Preparation:  Monitoring: As per clinic protocol. Respiration, ETCO2, SpO2, BP, heart rate and rhythm monitor placed and checked for adequate function Safety Precautions: Patient was assessed for positional comfort and pressure points before starting the procedure. Time-out: I initiated and conducted the "Time-out" before starting the procedure, as per protocol. The patient was asked to participate by confirming the accuracy of the "Time Out" information. Verification of the correct person, site, and procedure were performed and confirmed by me, the nursing staff, and the patient. "Time-out" conducted as per Joint Commission's Universal Protocol (UP.01.01.01). Time:    Description of Procedure:          Laterality: Left Levels:  L2, L3, L4, L5, & S1 Medial Branch Level(s), at the L3-4, L4-5, and the L5-S1 lumbar facet joints. Area Prepped: Lumbosacral Prepping solution: DuraPrep (Iodine Povacrylex [0.7% available iodine] and Isopropyl Alcohol, 74% w/w) Safety Precautions: Aspiration looking for blood return was conducted prior to all  injections. At no point did we inject any substances, as a needle was being advanced. Before injecting, the patient was told to immediately notify me if she was experiencing any new onset of "ringing in the ears,  or metallic taste in the mouth". No attempts were made at seeking any paresthesias. Safe injection practices and needle disposal techniques used. Medications properly checked for expiration dates. SDV (single dose vial) medications used. After the completion of the procedure, all disposable equipment used was discarded in the proper designated medical waste containers. Local Anesthesia: Protocol guidelines were followed. The patient was positioned over the fluoroscopy table. The area was prepped in the usual manner. The time-out was completed. The target area was identified using fluoroscopy. A 12-in long, straight, sterile hemostat was used with fluoroscopic guidance to locate the targets for each level blocked. Once located, the skin was marked with an approved surgical skin marker. Once all sites were marked, the skin (epidermis, dermis, and hypodermis), as well as deeper tissues (fat, connective tissue and muscle) were infiltrated with a small amount of a short-acting local anesthetic, loaded on a 10cc syringe with a 25G, 1.5-in  Needle. An appropriate amount of time was allowed for local anesthetics to take effect before proceeding to the next step. Local Anesthetic: Lidocaine 2.0% The unused portion of the local anesthetic was discarded in the proper designated containers. Technical explanation of process:  Radiofrequency Ablation (RFA) L2 Medial Branch Nerve RFA: The target area for the L2 medial branch is at the junction of the postero-lateral aspect of the superior articular process and the superior, posterior, and medial edge of the transverse process of L3. Under fluoroscopic guidance, a Radiofrequency needle was inserted until contact was made with os over the superior postero-lateral  aspect of the pedicular shadow (target area). Sensory and motor testing was conducted to properly adjust the position of the needle. Once satisfactory placement of the needle was achieved, the numbing solution was slowly injected after negative aspiration for blood. 2.0 mL of the nerve block solution was injected without difficulty or complication. After waiting for at least 3 minutes, the ablation was performed. Once completed, the needle was removed intact. L3 Medial Branch Nerve RFA: The target area for the L3 medial branch is at the junction of the postero-lateral aspect of the superior articular process and the superior, posterior, and medial edge of the transverse process of L4. Under fluoroscopic guidance, a Radiofrequency needle was inserted until contact was made with os over the superior postero-lateral aspect of the pedicular shadow (target area). Sensory and motor testing was conducted to properly adjust the position of the needle. Once satisfactory placement of the needle was achieved, the numbing solution was slowly injected after negative aspiration for blood. 2.0 mL of the nerve block solution was injected without difficulty or complication. After waiting for at least 3 minutes, the ablation was performed. Once completed, the needle was removed intact. L4 Medial Branch Nerve RFA: The target area for the L4 medial branch is at the junction of the postero-lateral aspect of the superior articular process and the superior, posterior, and medial edge of the transverse process of L5. Under fluoroscopic guidance, a Radiofrequency needle was inserted until contact was made with os over the superior postero-lateral aspect of the pedicular shadow (target area). Sensory and motor testing was conducted to properly adjust the position of the needle. Once satisfactory placement of the needle was achieved, the numbing solution was slowly injected after negative aspiration for blood. 2.0 mL of the nerve block  solution was injected without difficulty or complication. After waiting for at least 3 minutes, the ablation was performed. Once completed, the needle was removed intact. L5 Medial Branch Nerve RFA: The target area for  the L5 medial branch is at the junction of the postero-lateral aspect of the superior articular process of S1 and the superior, posterior, and medial edge of the sacral ala. Under fluoroscopic guidance, a Radiofrequency needle was inserted until contact was made with os over the superior postero-lateral aspect of the pedicular shadow (target area). Sensory and motor testing was conducted to properly adjust the position of the needle. Once satisfactory placement of the needle was achieved, the numbing solution was slowly injected after negative aspiration for blood. 2.0 mL of the nerve block solution was injected without difficulty or complication. After waiting for at least 3 minutes, the ablation was performed. Once completed, the needle was removed intact. S1 Medial Branch Nerve RFA: The target area for the S1 medial branch is located inferior to the junction of the S1 superior articular process and the L5 inferior articular process, posterior, inferior, and lateral to the 6 o'clock position of the L5-S1 facet joint, just superior to the S1 posterior foramen. Under fluoroscopic guidance, the Radiofrequency needle was advanced until contact was made with os over the Target area. Sensory and motor testing was conducted to properly adjust the position of the needle. Once satisfactory placement of the needle was achieved, the numbing solution was slowly injected after negative aspiration for blood. 2.0 mL of the nerve block solution was injected without difficulty or complication. After waiting for at least 3 minutes, the ablation was performed. Once completed, the needle was removed intact. Radiofrequency lesioning (ablation):  Radiofrequency Generator: NeuroTherm [id number] Sensory Stimulation  Parameters: 50 Hz was used to locate & identify the nerve, making sure that the needle was positioned such that there was no sensory stimulation below 0.3 V or above 0.7 V. Motor Stimulation Parameters: 2 Hz was used to evaluate the motor component. Care was taken not to lesion any nerves that demonstrated motor stimulation of the lower extremities at an output of less than 2.5 times that of the sensory threshold, or a maximum of 2.0 V. Lesioning Technique Parameters: Standard Radiofrequency settings. (Not bipolar or pulsed.) Temperature Settings: 80 degrees C Lesioning time: 60 seconds Intra-operative Compliance: Compliant Materials & Medications: Needle(s) (Electrode/Cannula) Type: Teflon-coated, curved tip, Radiofrequency needle(s) Gauge: 22G Length: 10cm Numbing solution: 0.2% PF-Ropivacaine + Triamcinolone (40 mg/mL) diluted to a final concentration of 4 mg of Triamcinolone/mL of Ropivacaine The unused portion of the solution was discarded in the proper designated containers.  Once the entire procedure was completed, the treated area was cleaned, making sure to leave some of the prepping solution back to take advantage of its long term bactericidal properties.  Illustration of the posterior view of the lumbar spine and the posterior neural structures. Laminae of L2 through S1 are labeled. DPRL5, dorsal primary ramus of L5; DPRS1, dorsal primary ramus of S1; DPR3, dorsal primary ramus of L3; FJ, facet (zygapophyseal) joint L3-L4; I, inferior articular process of L4; LB1, lateral branch of dorsal primary ramus of L1; IAB, inferior articular branches from L3 medial branch (supplies L4-L5 facet joint); IBP, intermediate branch plexus; MB3, medial branch of dorsal primary ramus of L3; NR3, third lumbar nerve root; S, superior articular process of L5; SAB, superior articular branches from L4 (supplies L4-5 facet joint also); TP3, transverse process of L3.  There were no vitals filed for this  visit.  Start Time:   hrs. End Time:   hrs.  Imaging Guidance (Spinal):          Type of Imaging Technique: Fluoroscopy Guidance (Spinal) Indication(s): Assistance in  needle guidance and placement for procedures requiring needle placement in or near specific anatomical locations not easily accessible without such assistance. Exposure Time: Please see nurses notes. Contrast: None used. Fluoroscopic Guidance: I was personally present during the use of fluoroscopy. "Tunnel Vision Technique" used to obtain the best possible view of the target area. Parallax error corrected before commencing the procedure. "Direction-depth-direction" technique used to introduce the needle under continuous pulsed fluoroscopy. Once target was reached, antero-posterior, oblique, and lateral fluoroscopic projection used confirm needle placement in all planes. Images permanently stored in EMR. Interpretation: No contrast injected. I personally interpreted the imaging intraoperatively. Adequate needle placement confirmed in multiple planes. Permanent images saved into the patient's record.  Antibiotic Prophylaxis:   Anti-infectives (From admission, onward)   None     Indication(s): None identified  Post-operative Assessment:  Post-procedure Vital Signs:  Pulse/HCG Rate:    Temp:   Resp:   BP:   SpO2:    EBL: None  Complications: No immediate post-treatment complications observed by team, or reported by patient.  Note: The patient tolerated the entire procedure well. A repeat set of vitals were taken after the procedure and the patient was kept under observation following institutional policy, for this type of procedure. Post-procedural neurological assessment was performed, showing return to baseline, prior to discharge. The patient was provided with post-procedure discharge instructions, including a section on how to identify potential problems. Should any problems arise concerning this procedure, the patient  was given instructions to immediately contact us, at any time, without hesitation. In any case, we plan to contact the patient by telephone for a follow-up status report regarding this interventional procedure.  Comments:  No additional relevant information.  Plan of Care  Orders:  No orders of the defined types were placed in this encounter.  Chronic Opioid Analgesic:  Oxycodone IR 5 mg, 1 tab PO q 8 hrs ( /dayof oxycodone) MME/day:22.5mg /day.   Medications ordered for procedure: No orders of the defined types were placed in this encounter.  Medications administered: Natori A. Moisan had no medications administered during this visit.  See the medical record for exact dosing, route, and time of administration.  Follow-up plan:   No follow-ups on file.       Interventional treatment options: Under consideration:   Therapeutic bilateral lumbar facet RFA#1 (starting with the left side) Diagnostic bilateral SI joint block Diagnostic right IA hip joint injection Diagnostic bilateral genicular NB Possible bilateral genicular nerve RFA  Diagnostic left LESI  Diagnostic right CESI  Diagnostic bilateral cervical facet block  Diagnostic bilateral GONB  Possible greater occipital nerve RFA    Therapeutic/palliative (PRN):   Palliative left IA hip joint injection #2  Palliative bilateral lumbar facet block #5  Palliative bilateral IA Hyalgan knee injection #S2/N1 (last done on 11/08/2017)    Recent Visits Date Type Provider Dept  05/24/19 Office Visit Delano Metz, MD [facility]  Showing recent visits within past 90 days and meeting all other requirements   Today's Visits Date Type Provider Dept  06/19/19 Appointment Delano Metz, MD [facility]  Showing today's visits and meeting all other requirements   Future Appointments Date Type Provider Dept  08/20/19 Appointment Delano Metz, MD [facility]  Showing future  appointments within next 90 days and meeting all other requirements   Disposition: Discharge home  Discharge Date & Time: 06/19/2019;   hrs.   Primary Care Physician: Hillery Aldo, MD Location: [facility] Note by: Sydnee Levans  Laban Emperor, MD Date: 06/19/2019; Time: 7:17 AM  Disclaimer:  Medicine is not an Visual merchandiser. The only guarantee in medicine is that nothing is guaranteed. It is important to note that the decision to proceed with this intervention was based on the information collected from the patient. The Data and conclusions were drawn from the patient's questionnaire, the interview, and the physical examination. Because the information was provided in large part by the patient, it cannot be guaranteed that it has not been purposely or unconsciously manipulated. Every effort has been made to obtain as much relevant data as possible for this evaluation. It is important to note that the conclusions that lead to this procedure are derived in large part from the available data. Always take into account that the treatment will also be dependent on availability of resources and existing treatment guidelines, considered by other Pain Management Practitioners as being common knowledge and practice, at the time of the intervention. For Medico-Legal purposes, it is also important to point out that variation in procedural techniques and pharmacological choices are the acceptable norm. The indications, contraindications, technique, and results of the above procedure should only be interpreted and judged by a Board-Certified Interventional Pain Specialist with extensive familiarity and expertise in the same exact procedure and technique.

## 2019-06-22 ENCOUNTER — Other Ambulatory Visit: Payer: Self-pay | Admitting: Pain Medicine

## 2019-06-22 DIAGNOSIS — R252 Cramp and spasm: Secondary | ICD-10-CM

## 2019-06-22 DIAGNOSIS — G8929 Other chronic pain: Secondary | ICD-10-CM

## 2019-06-22 DIAGNOSIS — M7918 Myalgia, other site: Secondary | ICD-10-CM

## 2019-06-22 DIAGNOSIS — M62830 Muscle spasm of back: Secondary | ICD-10-CM

## 2019-06-25 ENCOUNTER — Telehealth: Payer: Self-pay | Admitting: Pain Medicine

## 2019-06-25 NOTE — Telephone Encounter (Signed)
Called [employer] and new PA sent 06/25/2019 with History

## 2019-06-25 NOTE — Telephone Encounter (Signed)
Pt called stating her tizanidine needs PA and she stated that the pharmacy sent over a form for the PA before Thanksgiving and wanted to know why it hasn't been done.

## 2019-06-26 ENCOUNTER — Telehealth: Payer: Self-pay | Admitting: Pain Medicine

## 2019-06-26 NOTE — Telephone Encounter (Signed)
Oxycodone was approved 05-2019. Tizanidine was denied because patient has not tried and failed 2 approved muscle relaxers. Patient notified, she is willing to try another muscle relaxer.

## 2019-06-26 NOTE — Telephone Encounter (Signed)
Patient calling about prior authorizations for her Oxycodone and also needs her Tizanidine? Please check on this and let patient know status.

## 2019-06-27 ENCOUNTER — Other Ambulatory Visit: Payer: Self-pay | Admitting: Pain Medicine

## 2019-06-27 DIAGNOSIS — G8929 Other chronic pain: Secondary | ICD-10-CM

## 2019-06-27 DIAGNOSIS — M7918 Myalgia, other site: Secondary | ICD-10-CM

## 2019-06-27 DIAGNOSIS — M62830 Muscle spasm of back: Secondary | ICD-10-CM

## 2019-06-27 MED ORDER — METHOCARBAMOL 750 MG PO TABS: 750.0000 mg | ORAL_TABLET | Freq: Three times a day (TID) | ORAL | 5 refills | Status: DC | PRN

## 2019-06-27 NOTE — Progress Notes (Signed)
Patient's insurance did not cover that I tizanidine.  Today we have switched her to methocarbamol (Robaxin 750 mg) 3 times daily PRN for Chronic musculoskeletal pain (M79.18, G89.29).

## 2019-06-28 ENCOUNTER — Other Ambulatory Visit: Payer: Self-pay

## 2019-06-28 ENCOUNTER — Ambulatory Visit
Admission: RE | Admit: 2019-06-28 | Discharge: 2019-06-28 | Disposition: A | Discharge status: Home / Self Care | Payer: Medicaid Other | Source: Ambulatory Visit | Attending: Pain Medicine | Admitting: Pain Medicine

## 2019-06-28 ENCOUNTER — Ambulatory Visit (HOSPITAL_BASED_OUTPATIENT_CLINIC_OR_DEPARTMENT_OTHER): Payer: Medicaid Other | Admitting: Pain Medicine

## 2019-06-28 ENCOUNTER — Encounter: Payer: Self-pay | Admitting: Pain Medicine

## 2019-06-28 VITALS — BP 122/59 | HR 94 | Temp 96.4°F | Resp 20 | Ht 68.0 in | Wt 198.0 lb

## 2019-06-28 DIAGNOSIS — M47817 Spondylosis without myelopathy or radiculopathy, lumbosacral region: Secondary | ICD-10-CM | POA: Insufficient documentation

## 2019-06-28 DIAGNOSIS — M5137 Other intervertebral disc degeneration, lumbosacral region: Secondary | ICD-10-CM | POA: Insufficient documentation

## 2019-06-28 DIAGNOSIS — M47816 Spondylosis without myelopathy or radiculopathy, lumbar region: Secondary | ICD-10-CM | POA: Diagnosis not present

## 2019-06-28 DIAGNOSIS — M545 Low back pain: Secondary | ICD-10-CM | POA: Diagnosis not present

## 2019-06-28 DIAGNOSIS — G8918 Other acute postprocedural pain: Secondary | ICD-10-CM | POA: Diagnosis present

## 2019-06-28 DIAGNOSIS — G8929 Other chronic pain: Secondary | ICD-10-CM | POA: Insufficient documentation

## 2019-06-28 DIAGNOSIS — G894 Chronic pain syndrome: Secondary | ICD-10-CM | POA: Insufficient documentation

## 2019-06-28 DIAGNOSIS — M51379 Other intervertebral disc degeneration, lumbosacral region without mention of lumbar back pain or lower extremity pain: Secondary | ICD-10-CM

## 2019-06-28 MED ORDER — LIDOCAINE HCL 2 % IJ SOLN
INTRAMUSCULAR | Status: AC
  Filled 2019-06-28: qty 20

## 2019-06-28 MED ORDER — FENTANYL CITRATE (PF) 100 MCG/2ML IJ SOLN
25.0000 ug | INTRAMUSCULAR | Status: DC | PRN
  Administered 2019-06-28: 100 ug via INTRAVENOUS

## 2019-06-28 MED ORDER — LACTATED RINGERS IV SOLN
1000.0000 mL | Freq: Once | INTRAVENOUS | Status: AC
  Administered 2019-06-28: 1000 mL via INTRAVENOUS

## 2019-06-28 MED ORDER — ROPIVACAINE HCL 2 MG/ML IJ SOLN
9.0000 mL | Freq: Once | INTRAMUSCULAR | Status: AC
  Administered 2019-06-28: 10 mL via PERINEURAL

## 2019-06-28 MED ORDER — HYDROCODONE-ACETAMINOPHEN 5-325 MG PO TABS: 1.0000 | ORAL_TABLET | Freq: Three times a day (TID) | ORAL | 0 refills | Status: AC | PRN

## 2019-06-28 MED ORDER — FENTANYL CITRATE (PF) 100 MCG/2ML IJ SOLN
INTRAMUSCULAR | Status: AC
  Filled 2019-06-28: qty 2

## 2019-06-28 MED ORDER — TRIAMCINOLONE ACETONIDE 40 MG/ML IJ SUSP
40.0000 mg | Freq: Once | INTRAMUSCULAR | Status: AC
  Administered 2019-06-28: 40 mg

## 2019-06-28 MED ORDER — TRIAMCINOLONE ACETONIDE 40 MG/ML IJ SUSP
INTRAMUSCULAR | Status: AC
  Filled 2019-06-28: qty 1

## 2019-06-28 MED ORDER — MIDAZOLAM HCL 5 MG/5ML IJ SOLN
INTRAMUSCULAR | Status: AC
  Filled 2019-06-28: qty 5

## 2019-06-28 MED ORDER — MIDAZOLAM HCL 5 MG/5ML IJ SOLN
1.0000 mg | INTRAMUSCULAR | Status: DC | PRN
  Administered 2019-06-28: 2 mg via INTRAVENOUS

## 2019-06-28 MED ORDER — LIDOCAINE HCL 2 % IJ SOLN
20.0000 mL | Freq: Once | INTRAMUSCULAR | Status: AC
  Administered 2019-06-28: 400 mg

## 2019-06-28 MED ORDER — ROPIVACAINE HCL 2 MG/ML IJ SOLN
INTRAMUSCULAR | Status: AC
  Filled 2019-06-28: qty 10

## 2019-06-28 MED ORDER — OXYCODONE HCL 5 MG PO TABS: 5.0000 mg | ORAL_TABLET | Freq: Three times a day (TID) | ORAL | 0 refills | Status: DC | PRN

## 2019-06-28 NOTE — Progress Notes (Signed)
Safety precautions to be maintained throughout the outpatient stay will include: orient to surroundings, keep bed in low position, maintain call bell within reach at all times, provide assistance with transfer out of bed and ambulation.  

## 2019-06-28 NOTE — Patient Instructions (Signed)

## 2019-06-28 NOTE — Progress Notes (Signed)
Patient's Name: Erica [name]  MRN: [id number]  Referring Provider: Hillery AldoPatel, Sarah, MD  DOB: 1966/05/31  PCP: Hillery AldoPatel, Sarah, MD  DOS: 06/28/2019  Note by: Oswaldo DoneFrancisco A Ayren Zumbro, MD  Service setting: Ambulatory outpatient  Specialty: Interventional Pain Management  Patient type: Established  Location: [facility] (AMB) Pain Management Facility  Visit type: Interventional Procedure   Primary Reason for Visit: Interventional Pain Management Treatment. CC: Back Pain (lower)  Procedure:          Anesthesia, Analgesia, Anxiolysis:  Type: Thermal Lumbar Facet, Medial Branch Radiofrequency Ablation/Neurotomy  #1  Primary Purpose: Therapeutic Region: Posterolateral Lumbosacral Spine Level: L2, L3, L4, L5, & S1 Medial Branch Level(s). These levels will denervate the L3-4, L4-5, and the L5-S1 lumbar facet joints. Laterality: Left  Type: Moderate (Conscious) Sedation combined with Local Anesthesia Indication(s): Analgesia and Anxiety Route: Intravenous (IV) IV Access: Secured Sedation: Meaningful verbal contact was maintained at all times during the procedure  Local Anesthetic: Lidocaine 1-2%  Position: Prone   Indications: 1. Lumbar facet syndrome (Bilateral) (R>L)   2. Spondylosis without myelopathy or radiculopathy, lumbosacral region   3. DDD (degenerative disc disease), lumbosacral   4. Lumbar spondylosis   5. Chronic low back pain (Primary Area of Pain) (Bilateral) (L>R)    Erica [name] has been dealing with the above chronic pain for longer than three months and has either failed to respond, was unable to tolerate, or simply did not get enough benefit from other more conservative therapies including, but not limited to: 1. Over-the-counter medications 2. Anti-inflammatory medications 3. Muscle relaxants 4. Membrane stabilizers 5. Opioids 6. Physical therapy and/or chiropractic manipulation 7. Modalities (Heat, ice, etc.) 8. Invasive techniques such as nerve blocks. Erica [name] has attained more  than 50% relief of the pain from a series of diagnostic injections conducted in separate occasions.  Pain Score: Pre-procedure: 7 /10 Post-procedure: 0-No pain/10  Pre-op Assessment:  Erica [name] is a 53 y.o. (year old), female patient, seen today for interventional treatment. She  has a past surgical history that includes Knee surgery (Left) and Flexible bronchoscopy (N/A, 06/20/2015). Erica [name] has a current medication list which includes the following prescription(s): albuterol, aspirin, atorvastatin, accu-chek aviva plus, buspirone, vitamin d3, diltiazem, diltiazem, duloxetine, duloxetine, fluticasone-salmeterol, furosemide, metformin, methocarbamol, oxycodone, [START ON 07/22/2019] oxycodone, oxygen-helium, pantoprazole, pregabalin, sucralfate, tiotropium, hydrocodone-acetaminophen, [START ON 07/05/2019] hydrocodone-acetaminophen, and [START ON 08/21/2019] oxycodone, and the following Facility-Administered Medications: fentanyl and midazolam. Her primarily concern today is the Back Pain (lower)  Initial Vital Signs:  Pulse/HCG Rate: 94ECG Heart Rate: 85 Temp: (!) 97.5 F (36.4 C) Resp: 18 BP: (!) 133/50 SpO2: 99 %  BMI: Estimated body mass index is 30.11 kg/m as calculated from the following:   Height as of this encounter: 5\' 8"  (1.727 m).   Weight as of this encounter: 198 lb (89.8 kg).  Risk Assessment: Allergies: Reviewed. She is allergic to tramadol and penicillins.  Allergy Precautions: None required Coagulopathies: Reviewed. None identified.  Blood-thinner therapy: None at this time Active Infection(s): Reviewed. None identified. Erica [name] is afebrile  Site Confirmation: Erica [name] was asked to confirm the procedure and laterality before marking the site Procedure checklist: Completed Consent: Before the procedure and under the influence of no sedative(s), amnesic(s), or anxiolytics, the patient was informed of the treatment options, risks and possible complications. To fulfill  our ethical and legal obligations, as recommended by the American Medical Association's Code of Ethics, I have informed the patient of my clinical impression; the nature and purpose of  the treatment or procedure; the risks, benefits, and possible complications of the intervention; the alternatives, including doing nothing; the risk(s) and benefit(s) of the alternative treatment(s) or procedure(s); and the risk(s) and benefit(s) of doing nothing. The patient was provided information about the general risks and possible complications associated with the procedure. These may include, but are not limited to: failure to achieve desired goals, infection, bleeding, organ or nerve damage, allergic reactions, paralysis, and death. In addition, the patient was informed of those risks and complications associated to Spine-related procedures, such as failure to decrease pain; infection (i.e.: Meningitis, epidural or intraspinal abscess); bleeding (i.e.: epidural hematoma, subarachnoid hemorrhage, or any other type of intraspinal or peri-dural bleeding); organ or nerve damage (i.e.: Any type of peripheral nerve, nerve root, or spinal cord injury) with subsequent damage to sensory, motor, and/or autonomic systems, resulting in permanent pain, numbness, and/or weakness of one or several areas of the body; allergic reactions; (i.e.: anaphylactic reaction); and/or death. Furthermore, the patient was informed of those risks and complications associated with the medications. These include, but are not limited to: allergic reactions (i.e.: anaphylactic or anaphylactoid reaction(s)); adrenal axis suppression; blood sugar elevation that in diabetics may result in ketoacidosis or comma; water retention that in patients with history of congestive heart failure may result in shortness of breath, pulmonary edema, and decompensation with resultant heart failure; weight gain; swelling or edema; medication-induced neural toxicity;  particulate matter embolism and blood vessel occlusion with resultant organ, and/or nervous system infarction; and/or aseptic necrosis of one or more joints. Finally, the patient was informed that Medicine is not an exact science; therefore, there is also the possibility of unforeseen or unpredictable risks and/or possible complications that may result in a catastrophic outcome. The patient indicated having understood very clearly. We have given the patient no guarantees and we have made no promises. Enough time was given to the patient to ask questions, all of which were answered to the patient's satisfaction. Ms. Sharps has indicated that she wanted to continue with the procedure. Attestation: I, the ordering provider, attest that I have discussed with the patient the benefits, risks, side-effects, alternatives, likelihood of achieving goals, and potential problems during recovery for the procedure that I have provided informed consent. Date   Time: 06/28/2019 10:39 AM  Pre-Procedure Preparation:  Monitoring: As per clinic protocol. Respiration, ETCO2, SpO2, BP, heart rate and rhythm monitor placed and checked for adequate function Safety Precautions: Patient was assessed for positional comfort and pressure points before starting the procedure. Time-out: I initiated and conducted the "Time-out" before starting the procedure, as per protocol. The patient was asked to participate by confirming the accuracy of the "Time Out" information. Verification of the correct person, site, and procedure were performed and confirmed by me, the nursing staff, and the patient. "Time-out" conducted as per Joint Commission's Universal Protocol (UP.01.01.01). Time: 1121  Description of Procedure:          Laterality: Left Levels:  L2, L3, L4, L5, & S1 Medial Branch Level(s), at the L3-4, L4-5, and the L5-S1 lumbar facet joints. Area Prepped: Lumbosacral Prepping solution: DuraPrep (Iodine Povacrylex [0.7% available  iodine] and Isopropyl Alcohol, 74% w/w) Safety Precautions: Aspiration looking for blood return was conducted prior to all injections. At no point did we inject any substances, as a needle was being advanced. Before injecting, the patient was told to immediately notify me if she was experiencing any new onset of "ringing in the ears, or metallic taste in the mouth". No  attempts were made at seeking any paresthesias. Safe injection practices and needle disposal techniques used. Medications properly checked for expiration dates. SDV (single dose vial) medications used. After the completion of the procedure, all disposable equipment used was discarded in the proper designated medical waste containers. Local Anesthesia: Protocol guidelines were followed. The patient was positioned over the fluoroscopy table. The area was prepped in the usual manner. The time-out was completed. The target area was identified using fluoroscopy. A 12-in long, straight, sterile hemostat was used with fluoroscopic guidance to locate the targets for each level blocked. Once located, the skin was marked with an approved surgical skin marker. Once all sites were marked, the skin (epidermis, dermis, and hypodermis), as well as deeper tissues (fat, connective tissue and muscle) were infiltrated with a small amount of a short-acting local anesthetic, loaded on a 10cc syringe with a 25G, 1.5-in  Needle. An appropriate amount of time was allowed for local anesthetics to take effect before proceeding to the next step. Local Anesthetic: Lidocaine 2.0% The unused portion of the local anesthetic was discarded in the proper designated containers. Technical explanation of process:  Radiofrequency Ablation (RFA) L2 Medial Branch Nerve RFA: The target area for the L2 medial branch is at the junction of the postero-lateral aspect of the superior articular process and the superior, posterior, and medial edge of the transverse process of L3. Under  fluoroscopic guidance, a Radiofrequency needle was inserted until contact was made with os over the superior postero-lateral aspect of the pedicular shadow (target area). Sensory and motor testing was conducted to properly adjust the position of the needle. Once satisfactory placement of the needle was achieved, the numbing solution was slowly injected after negative aspiration for blood. 2.0 mL of the nerve block solution was injected without difficulty or complication. After waiting for at least 3 minutes, the ablation was performed. Once completed, the needle was removed intact. L3 Medial Branch Nerve RFA: The target area for the L3 medial branch is at the junction of the postero-lateral aspect of the superior articular process and the superior, posterior, and medial edge of the transverse process of L4. Under fluoroscopic guidance, a Radiofrequency needle was inserted until contact was made with os over the superior postero-lateral aspect of the pedicular shadow (target area). Sensory and motor testing was conducted to properly adjust the position of the needle. Once satisfactory placement of the needle was achieved, the numbing solution was slowly injected after negative aspiration for blood. 2.0 mL of the nerve block solution was injected without difficulty or complication. After waiting for at least 3 minutes, the ablation was performed. Once completed, the needle was removed intact. L4 Medial Branch Nerve RFA: The target area for the L4 medial branch is at the junction of the postero-lateral aspect of the superior articular process and the superior, posterior, and medial edge of the transverse process of L5. Under fluoroscopic guidance, a Radiofrequency needle was inserted until contact was made with os over the superior postero-lateral aspect of the pedicular shadow (target area). Sensory and motor testing was conducted to properly adjust the position of the needle. Once satisfactory placement of the  needle was achieved, the numbing solution was slowly injected after negative aspiration for blood. 2.0 mL of the nerve block solution was injected without difficulty or complication. After waiting for at least 3 minutes, the ablation was performed. Once completed, the needle was removed intact. L5 Medial Branch Nerve RFA: The target area for the L5 medial branch is at the  junction of the postero-lateral aspect of the superior articular process of S1 and the superior, posterior, and medial edge of the sacral ala. Under fluoroscopic guidance, a Radiofrequency needle was inserted until contact was made with os over the superior postero-lateral aspect of the pedicular shadow (target area). Sensory and motor testing was conducted to properly adjust the position of the needle. Once satisfactory placement of the needle was achieved, the numbing solution was slowly injected after negative aspiration for blood. 2.0 mL of the nerve block solution was injected without difficulty or complication. After waiting for at least 3 minutes, the ablation was performed. Once completed, the needle was removed intact. S1 Medial Branch Nerve RFA: The target area for the S1 medial branch is located inferior to the junction of the S1 superior articular process and the L5 inferior articular process, posterior, inferior, and lateral to the 6 o'clock position of the L5-S1 facet joint, just superior to the S1 posterior foramen. Under fluoroscopic guidance, the Radiofrequency needle was advanced until contact was made with os over the Target area. Sensory and motor testing was conducted to properly adjust the position of the needle. Once satisfactory placement of the needle was achieved, the numbing solution was slowly injected after negative aspiration for blood. 2.0 mL of the nerve block solution was injected without difficulty or complication. After waiting for at least 3 minutes, the ablation was performed. Once completed, the needle was  removed intact. Radiofrequency lesioning (ablation):  Radiofrequency Generator: NeuroTherm [id number] Sensory Stimulation Parameters: 50 Hz was used to locate & identify the nerve, making sure that the needle was positioned such that there was no sensory stimulation below 0.3 V or above 0.7 V. Motor Stimulation Parameters: 2 Hz was used to evaluate the motor component. Care was taken not to lesion any nerves that demonstrated motor stimulation of the lower extremities at an output of less than 2.5 times that of the sensory threshold, or a maximum of 2.0 V. Lesioning Technique Parameters: Standard Radiofrequency settings. (Not bipolar or pulsed.) Temperature Settings: 80 degrees C Lesioning time: 60 seconds Intra-operative Compliance: Compliant Materials & Medications: Needle(s) (Electrode/Cannula) Type: Teflon-coated, curved tip, Radiofrequency needle(s) Gauge: 22G Length: 10cm Numbing solution: 0.2% PF-Ropivacaine + Triamcinolone (40 mg/mL) diluted to a final concentration of 4 mg of Triamcinolone/mL of Ropivacaine The unused portion of the solution was discarded in the proper designated containers.  Once the entire procedure was completed, the treated area was cleaned, making sure to leave some of the prepping solution back to take advantage of its long term bactericidal properties.  Illustration of the posterior view of the lumbar spine and the posterior neural structures. Laminae of L2 through S1 are labeled. DPRL5, dorsal primary ramus of L5; DPRS1, dorsal primary ramus of S1; DPR3, dorsal primary ramus of L3; FJ, facet (zygapophyseal) joint L3-L4; I, inferior articular process of L4; LB1, lateral branch of dorsal primary ramus of L1; IAB, inferior articular branches from L3 medial branch (supplies L4-L5 facet joint); IBP, intermediate branch plexus; MB3, medial branch of dorsal primary ramus of L3; NR3, third lumbar nerve root; S, superior articular process of L5; SAB, superior articular branches  from L4 (supplies L4-5 facet joint also); TP3, transverse process of L3.  Vitals:   06/28/19 1209 06/28/19 1217 06/28/19 1227 06/28/19 1238  BP: 117/62 (!) 115/59 (!) 119/56 (!) 122/59  Pulse:      Resp: Temp:  (!) 97.3 F (36.3 C) (!) 96.4 F (35.8 C)   SpO2: 98%  95% 94% 94%  Weight:      Height:        Start Time: 1121 hrs. End Time: 1209 hrs.  Imaging Guidance (Spinal):          Type of Imaging Technique: Fluoroscopy Guidance (Spinal) Indication(s): Assistance in needle guidance and placement for procedures requiring needle placement in or near specific anatomical locations not easily accessible without such assistance. Exposure Time: Please see nurses notes. Contrast: None used. Fluoroscopic Guidance: I was personally present during the use of fluoroscopy. "Tunnel Vision Technique" used to obtain the best possible view of the target area. Parallax error corrected before commencing the procedure. "Direction-depth-direction" technique used to introduce the needle under continuous pulsed fluoroscopy. Once target was reached, antero-posterior, oblique, and lateral fluoroscopic projection used confirm needle placement in all planes. Images permanently stored in EMR. Interpretation: No contrast injected. I personally interpreted the imaging intraoperatively. Adequate needle placement confirmed in multiple planes. Permanent images saved into the patient's record.  Antibiotic Prophylaxis:   Anti-infectives (From admission, onward)   None     Indication(s): None identified  Post-operative Assessment:  Post-procedure Vital Signs:  Pulse/HCG Rate: 9483 Temp: (!) 96.4 F (35.8 C) Resp: 20 BP: (!) 122/59 SpO2: 94 %  EBL: None  Complications: No immediate post-treatment complications observed by team, or reported by patient.  Note: The patient tolerated the entire procedure well. A repeat set of vitals were taken after the procedure and the patient was kept under  observation following institutional policy, for this type of procedure. Post-procedural neurological assessment was performed, showing return to baseline, prior to discharge. The patient was provided with post-procedure discharge instructions, including a section on how to identify potential problems. Should any problems arise concerning this procedure, the patient was given instructions to immediately contact us, at any time, without hesitation. In any case, we plan to contact the patient by telephone for a follow-up status report regarding this interventional procedure.  Comments:  No additional relevant information.  Plan of Care  Orders:  Orders Placed This Encounter  Procedures   RFA - Lumbar Facet (Today)    Scheduling Instructions:     Side(s): Left-sided     Level: L3-4, L4-5, & L5-S1 Facets (L2, L3, L4, L5, & S1 Medial Branch Nerves)     Sedation: With Sedation     Timeframe: Today    Order Specific Question:   Where will this procedure be performed?    Answer:   [facility]   RFA - Lumbar Facet (Schedule)    Standing Status:   Future    Standing Expiration Date:   12/26/2020    Scheduling Instructions:     Side(s): Right-sided     Level: L3-4, L4-5, & L5-S1 Facets (L2, L3, L4, L5, & S1 Medial Branch Nerves)     Sedation: With Sedation     Scheduling Timeframe: 2-6 weeks from now    Order Specific Question:   Where will this procedure be performed?    Answer:   [facility]   Fluoro (C-Arm) (<60 min) (No Report)    Intraoperative interpretation by procedural physician at [place].    Standing Status:   Standing    Number of Occurrences:   1    Order Specific Question:   Reason for exam:    Answer:   Assistance in needle guidance and placement for procedures requiring needle placement in or near specific anatomical locations not easily accessible without such assistance.   Consent:  L-FCT (RFA)    Nursing Order: Transcribe to consent form and  obtain patient signature. Note: Always confirm laterality of pain with Ms. [name], before procedure. Procedure: Lumbar Facet Radiofrequency Ablation Indication/Reason: Low Back Pain, with our without leg pain, due to Facet Joint Arthralgia (Joint Pain) known as Lumbar Facet Syndrome, secondary to Lumbar, and/or Lumbosacral Spondylosis (Arthritis of the Spine), without myelopathy or radiculopathy (Nerve Damage). Provider Attestation: I, Kendarrius Tanzi A. Laban Emperor, MD, (Pain Management Specialist), the physician/practitioner, attest that I have discussed with the patient the benefits, risks, side effects, alternatives, likelihood of achieving goals and potential problems during recovery for the procedure that I have provided informed consent.   Radiofrequency Tray    Equipment required: Sterile "Radiofrequency Tray"; Large hemostat (1); Small hemostat (1); Towels (6-8); 4x4 sterile sponge pack (1) Radiofrequency Needle(s): Size: Regular Quantity: 5    Standing Status:   Standing    Number of Occurrences:   1    Order Specific Question:   Specify    Answer:   Radiofrequency Tray   Chronic Opioid Analgesic:  Oxycodone IR 5 mg, 1 tab PO q 8 hrs ( /dayof oxycodone) MME/day:22.5mg /day.   Medications ordered for procedure: Meds ordered this encounter  Medications   lidocaine (XYLOCAINE) 2 % (with pres) injection 400 mg   lactated ringers infusion 1,000 mL   midazolam (VERSED) 5 MG/5ML injection 1-2 mg    Make sure Flumazenil is available in the pyxis when using this medication. If oversedation occurs, administer 0.2 mg IV over 15 sec. If after 45 sec no response, administer 0.2 mg again over 1 min; may repeat at 1 min intervals; not to exceed 4 doses (1 mg)   fentaNYL (SUBLIMAZE) injection 25-50 mcg    Make sure Narcan is available in the pyxis when using this medication. In the event of respiratory depression (RR< 8/min): Titrate NARCAN (naloxone) in increments of 0.1 to 0.2 mg IV at 2-3  minute intervals, until desired degree of reversal.   ropivacaine (PF) 2 mg/mL (0.2%) (NAROPIN) injection 9 mL   triamcinolone acetonide (KENALOG-40) injection 40 mg   HYDROcodone-acetaminophen (NORCO/VICODIN) 5-325 MG tablet    Sig: Take 1 tablet by mouth every 8 (eight) hours as needed for up to 7 days for severe pain. Must last 7 days.    Dispense:  21 tablet    Refill:  0    For acute post-operative pain. Not to be refilled. Must last 7 days.   HYDROcodone-acetaminophen (NORCO/VICODIN) 5-325 MG tablet    Sig: Take 1 tablet by mouth every 8 (eight) hours as needed for up to 7 days for severe pain. Must last for 7 days.    Dispense:  21 tablet    Refill:  0    For acute post-operative pain. Not to be refilled. Must last 7 days.   oxyCODONE (OXY IR/ROXICODONE) 5 MG immediate release tablet    Sig: Take 1 tablet (5 mg total) by mouth every 8 (eight) hours as needed for severe pain. Must last 30 days    Dispense:  90 tablet    Refill:  0    Chronic Pain: STOP Act (Not applicable) Fill 1 day early if closed on refill date. Do not fill until: 08/21/2019. To last until: 09/20/2019. Avoid benzodiazepines within 8 hours of opioids   Medications administered: We administered lidocaine, lactated ringers, midazolam, fentaNYL, ropivacaine (PF) 2 mg/mL (0.2%), and triamcinolone acetonide.  See the medical record for exact dosing, route, and time of administration.  Follow-up plan:  Return for RFA (w/ sedation): (R) L-FCT RFA #2.       Interventional treatment options: Under consideration:   Therapeutic bilateral lumbar facet RFA#1 (starting with the left side) Diagnostic bilateral SI joint block Diagnostic right IA hip joint injection Diagnostic bilateral genicular NB Possible bilateral genicular nerve RFA  Diagnostic left LESI  Diagnostic right CESI  Diagnostic bilateral cervical facet block  Diagnostic bilateral GONB  Possible greater occipital nerve RFA    Therapeutic/palliative  (PRN):   Palliative left IA hip joint injection #2  Palliative bilateral lumbar facet block #5  Palliative bilateral IA Hyalgan knee injection #S2/N1 (last done on 11/08/2017)     Recent Visits Date Type Provider Dept  05/24/19 Office Visit Delano Metz, MD [facility]  Showing recent visits within past 90 days and meeting all other requirements   Today's Visits Date Type Provider Dept  06/28/19 Procedure visit Delano Metz, MD [facility]  Showing today's visits and meeting all other requirements   Future Appointments Date Type Provider Dept  07/31/19 Appointment Delano Metz, MD [facility]  08/20/19 Appointment Delano Metz, MD [facility]  Showing future appointments within next 90 days and meeting all other requirements   Disposition: Discharge home  Discharge Date & Time: 06/28/2019; 1238 hrs.   Primary Care Physician: Hillery Aldo, MD Location: [facility] Note by: Oswaldo Done, MD Date: 06/28/2019; Time: 12:42 PM  Disclaimer:  Medicine is not an Visual merchandiser. The only guarantee in medicine is that nothing is guaranteed. It is important to note that the decision to proceed with this intervention was based on the information collected from the patient. The Data and conclusions were drawn from the patient's questionnaire, the interview, and the physical examination. Because the information was provided in large part by the patient, it cannot be guaranteed that it has not been purposely or unconsciously manipulated. Every effort has been made to obtain as much relevant data as possible for this evaluation. It is important to note that the conclusions that lead to this procedure are derived in large part from the available data. Always take into account that the treatment will also be dependent on availability of resources and existing treatment guidelines, considered by other Pain  Management Practitioners as being common knowledge and practice, at the time of the intervention. For Medico-Legal purposes, it is also important to point out that variation in procedural techniques and pharmacological choices are the acceptable norm. The indications, contraindications, technique, and results of the above procedure should only be interpreted and judged by a Board-Certified Interventional Pain Specialist with extensive familiarity and expertise in the same exact procedure and technique.

## 2019-06-29 ENCOUNTER — Telehealth: Payer: Self-pay

## 2019-06-29 NOTE — Telephone Encounter (Signed)
Post procedure phone call.  Patient states she is doing well.  

## 2019-07-31 ENCOUNTER — Ambulatory Visit: Payer: Medicaid Other | Admitting: Pain Medicine

## 2019-08-15 ENCOUNTER — Telehealth: Payer: Self-pay | Admitting: *Deleted

## 2019-08-15 NOTE — Telephone Encounter (Signed)
Attempted to call for pre appointment review of allergies/meds. Message left. 

## 2019-08-17 ENCOUNTER — Telehealth: Payer: Self-pay

## 2019-08-17 NOTE — Telephone Encounter (Signed)
Attempted to call patient, no answer. L:eft message on AM to return our call.

## 2019-08-19 NOTE — Progress Notes (Signed)
Patient: Erica [name]  Service Category: E/M  Provider: Oswaldo Done, MD  DOB: 11-06-1965  DOS: 08/20/2019  Location: Office  MRN: [id number]  Setting: Ambulatory outpatient  Referring Provider: Hillery Aldo, MD  Type: Established Patient  Specialty: Interventional Pain Management  PCP: Hillery Aldo, MD  Location: Remote location  Delivery: TeleHealth     Virtual Encounter - Pain Management PROVIDER NOTE: Information contained herein reflects review and annotations entered in association with encounter. Interpretation of such information and data should be left to medically-trained personnel. Information provided to patient can be located elsewhere in the medical record under "Patient Instructions". Document created using STT-dictation technology, any transcriptional errors that may result from process are unintentional.    Contact & Pharmacy Preferred: [phone] Home: [phone] (home) Mobile: [phone] (mobile) E-mail: [email or web address]  [facility] [phone] #[id number], [place] [address] AT [facility] [address] [place] Phone: [phone] Fax: [phone]   Pre-screening  Erica [name] offered "in-person" vs "virtual" encounter. She indicated preferring virtual for this encounter.   Reason COVID-19*  Social distancing based on CDC and AMA recommendations.   I contacted Erica [name] on 08/20/2019 via telephone.      I clearly identified myself as 10/18/2019, MD. I verified that I was speaking with the correct person using two identifiers (Name: Erica [name], and date of birth: 07-Jul-1966).  Consent I sought verbal advanced consent from 09/12/1965 for virtual visit interactions. I informed Erica [name] of possible security and privacy concerns, risks, and limitations associated with providing "not-in-person" medical evaluation and management services. I also informed Erica [name] of the availability of "in-person" appointments.  Finally, I informed her that there would be a charge for the virtual visit and that she could be  personally, fully or partially, financially responsible for it. Erica [name] expressed understanding and agreed to proceed.   Historic Elements   Erica [name] is a 54 y.o. year old, female patient evaluated today after her last encounter by our practice on 08/17/2019. Erica [name]  has a past medical history of (HFpEF) heart failure with preserved ejection fraction [facility]), Acute pancreatitis (08/13/2018), Arthritis, Asthma, Bell's palsy, Bronchitis, CHF (congestive heart failure) (HCC), COPD (chronic obstructive pulmonary disease) (HCC), Diabetes mellitus without complication (HCC), Gastric ulcer, Hyperlipidemia, Hypertension, OSA (obstructive sleep apnea), Pancreatitis, and Shoulder injury. She also  has a past surgical history that includes Knee surgery (Left) and Flexible bronchoscopy (N/A, 06/20/2015). Erica [name] has a current medication list which includes the following prescription(s): albuterol, aspirin, atorvastatin, accu-chek aviva plus, buspirone, vitamin d3, diltiazem, diltiazem, duloxetine, duloxetine, fluticasone-salmeterol, furosemide, magnesium, metformin, [START ON 08/21/2019] oxycodone, oxygen-helium, pantoprazole, pregabalin, sucralfate, tiotropium, [START ON 09/20/2019] oxycodone, [START ON 10/20/2019] oxycodone, and tizanidine. She  reports that she quit smoking about 4 years ago. Her smoking use included cigarettes. She has a 36.00 pack-year smoking history. She has never used smokeless tobacco. She reports that she does not drink alcohol or use drugs. Erica [name] is allergic to tramadol and penicillins.   HPI  Today, she is being contacted for both, medication management and a post-procedure assessment.  The patient refers that she is not having any problems with the medications, but they did not seem to be strong enough to take care of her pain.  Although the radiofrequency ablation seems to have  helped to some degree in terms of the back pain, she still having a lot of pain going down into the hips  and all the way down into her feet.  Part of the problem is that we have only done the radiofrequency on the left side and the right side remains to be done.  In addition, she indicates that she has her peripheral neuropathy that is affecting her, but there is the possibility that were dealing with both a peripheral neuropathy as well as the radiculopathy, which may be difficult to distinguish because of the overlap.  However, everything that she describes would suggest the possibility that she is having problems with both as well as problems with both of her hips.  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  What we do not have at this point is an MRI of her lumbar spine, which at this point I believe is due.  In addition to this, the patient indicates that she was unable to tolerate the methocarbamol.  She indicates that not only that it gave her some stomach problems, but in addition, it did not help her spasms.  She wants to go back to the tizanidine (Zanaflex) which seem to have helped.  Post-Procedure Evaluation  Procedure: Therapeutic left lumbar facet RFA #1 under fluoroscopic guidance and IV sedation Pre-procedure pain level:  7/10 Post-procedure: 0/10 (100% relief)  Sedation: Sedation provided.  Erica Salts, RN  08/20/2019  8:28 AM  Sign when Signing Visit Pain relief after procedure (treated area only): (Questions asked to patient) 1. Starting about 15 minutes after the procedure, and "while the area was still numb" (from the local anesthetics), were you having any of your usual pain "in that area" (the treated area)?  (NOTE: NOT including the discomfort from the needle sticks.) First 1 hour: 100 % better. First 4-6 hours: 100 % better.  3. How much better is your pain now, when compared to before the  procedure? Current benefit 0 % better.  Specifically, the patient indicates having no relief of the hip pain or lower extremity pain.  When he comes to the lower back, she feels that she has had less than 50% improvement this time.  This is different from what she has had in the past and therefore it makes [place] think about the possibility of something else going on. 4. Can you move better now? Improvement in ROM (Range of Motion): No. 5. Can you do more now? Improvement in function: No. 4. Did you have any problems with the procedure? Side-effects/Complications: No.  Current benefits: Defined as benefit that persist at this time.   Analgesia:  <50% better.  Here the patient is referring primarily to the lower back, but she indicates no benefit in terms of the hip pain or the pain going down to the knees.  If I ask the patient if her pain is going all the way down into her feet, she denies it.  However when I ask her if she is having pain in her feet she will admit she that she has pain in both of her feet, which feel numb as well as having a burning sensation.  Here the problem is that she is interpreting this pain to be exclusively the peripheral neuropathy when in fact there is the possibility that she may be having a bilateral lumbar radiculopathy with superimposed bilateral peripheral neuropathy.  In addition to this, part of the problem is that we have not completed the radiofrequency ablation on both sides of her lower back.  We have only done the left side and the right side remains.  In fact, when the patient was asked today, she indicated that most of her pain is on the right side.  This is to be expected since we have not treated that side yet. Function: No improvement ROM: No improvement  Pharmacotherapy Assessment  Analgesic: Oxycodone IR 5 mg, 1 tab PO q 8 hrs (15mg /dayof oxycodone) MME/day:22.5mg /day.   Monitoring: Pharmacotherapy: No side-effects or adverse reactions reported. [place]  PMP: PDMP reviewed during this encounter.       Compliance: No problems identified. Effectiveness: Clinically acceptable. Plan: Refer to "POC".  UDS:  Summary  Date Value Ref Range Status  08/29/2018 FINAL  Final    Comment:    ==================================================================== TOXASSURE SELECT 13 (MW) ==================================================================== Test                             Result       Flag       Units   NO DRUGS DETECTED. ==================================================================== Test                      Result    Flag   Units      Ref Range   Creatinine              167              mg/dL      >=20 ==================================================================== Declared Medications:  The flagging and interpretation on this report are based on the  following declared medications.  Unexpected results may arise from  inaccuracies in the declared medications.  **Note: The testing scope of this panel does not include following  reported medications:  Albuterol  Aspirin  Atorvastatin  Calcium  Cholecalciferol  Diltiazem  Duloxetine  Fluconazole  Fluticasone  Folic acid ==================================================================== For clinical consultation, please call [phone]. ====================================================================    Laboratory Chemistry Profile (12 mo)  Renal: 09/12/2018: BUN 7; Creatinine, Ser 0.56  Lab Results  Component Value Date   GFRAA >60 09/12/2018   GFRNONAA >60 09/12/2018   Hepatic: 09/10/2018: Albumin 3.5 Lab Results  Component Value Date   AST 35 09/10/2018   ALT 32 09/10/2018   Other: No results found for requested labs within last 8760 hours.  Note: Above Lab results reviewed.  Imaging  Fluoro (C-Arm) (<60 min) (No Report) Fluoro was used, but no Radiologist interpretation will be provided.  Please refer to "NOTES" tab for provider progress  note.   Assessment  The primary encounter diagnosis was Chronic hip pain (Secondary area of Pain) (Bilateral) (L>R). 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  Plan of Care  Problem-specific:  No problem-specific Assessment & Plan notes found for this encounter.  I have discontinued Erica [name] methocarbamol. I am also having her start on oxyCODONE. Additionally, I am having her maintain her aspirin, sucralfate, tiotropium, albuterol, DULoxetine, OXYGEN, Accu-Chek Aviva Plus, DULoxetine, metFORMIN, pantoprazole, Fluticasone-Salmeterol, atorvastatin, diltiazem, diltiazem, furosemide, busPIRone, Vitamin D3, pregabalin, oxyCODONE, oxyCODONE, Magnesium, and tiZANidine.  Pharmacotherapy (Medications Ordered): Meds ordered this encounter  Medications  . oxyCODONE (OXY IR/ROXICODONE) 5 MG immediate release tablet    Sig: Take 1 tablet (  5 mg total) by mouth every 8 (eight) hours as needed for severe pain. Must last 30 days    Dispense:  90 tablet    Refill:  0    Chronic Pain: STOP Act (Not applicable) Fill 1 day early if closed on refill date. Do not fill until: 09/20/2019. To last until: 10/20/2019. Avoid benzodiazepines within 8 hours of opioids  . oxyCODONE (OXY IR/ROXICODONE) 5 MG immediate release tablet    Sig: Take 1 tablet (5 mg total) by mouth every 8 (eight) hours as needed for severe pain. Must last 30 days    Dispense:  90 tablet    Refill:  0    Chronic Pain: STOP Act (Not applicable) Fill 1 day early if closed on refill date. Do not fill until: 10/20/2019. To last until: 11/19/2019. Avoid benzodiazepines within 8 hours of opioids  .  tiZANidine (ZANAFLEX) 4 MG capsule    Sig: Take 1 capsule (4 mg total) by mouth 3 (three) times daily as needed for muscle spasms.    Dispense:  90 capsule    Refill:  5    Do not place this medication, or any other prescription from our practice, on "Automatic Refill". Patient may have prescription filled one day early if pharmacy is closed on scheduled refill date.   Orders:  Orders Placed This Encounter  Procedures  . Radiofrequency,Lumbar    Standing Status:   Future    Standing Expiration Date:   02/16/2021    Scheduling Instructions:     Side(s): Right-sided     Level: L3-4, L4-5, & L5-S1 Facets (L2, L3, L4, L5, & S1 Medial Branch Nerves)     Sedation: With Sedation     Scheduling Timeframe: As soon as pre-approved    Order Specific Question:   Where will this procedure be performed?    Answer:   [facility]  . DG HIP UNILAT W OR W/O PELVIS 2-3 VIEWS RIGHT    Standing Status:   Future    Standing Expiration Date:   08/19/2020    Scheduling Instructions:     Please describe any evidence of DJD, such as joint narrowing, asymmetry, cysts, or any anomalies in bone density, production, or erosion.    Order Specific Question:   Reason for Exam (SYMPTOM  OR DIAGNOSIS REQUIRED)    Answer:   Right hip pain/arthralgia    Order Specific Question:   Is the patient pregnant?    Answer:   No    Order Specific Question:   Preferred imaging location?    Answer:   [facility]    Order Specific Question:   Call Results- Best Contact Number?    Answer:   [phone] (Pain Clinic facility) (Dr. Laban Emperor)  . DG HIP UNILAT W OR W/O PELVIS 2-3 VIEWS LEFT    Standing Status:   Future    Standing Expiration Date:   08/19/2020    Scheduling Instructions:     Please describe any evidence of DJD, such as joint narrowing, asymmetry, cysts, or any anomalies in bone density, production, or erosion.    Order Specific Question:   Reason for Exam (SYMPTOM  OR DIAGNOSIS REQUIRED)    Answer:    Right hip pain/arthralgia    Order Specific Question:   Is the patient pregnant?    Answer:   No    Order Specific Question:   Preferred imaging location?    Answer:   [facility]    Order Specific Question:  Call Results- Best Contact Number?    Answer:   [phone] (Pain Clinic facility) (Dr. Laban EmperorNaveira)  . MR LUMBAR SPINE WO CONTRAST    Patient presents with axial pain with possible radicular component.  In addition to any acute findings, please report on:  1. Facet (Zygapophyseal) joint DJD (Hypertrophy, space narrowing, subchondral sclerosis, and/or osteophyte formation) 2. DDD and/or IVDD (Loss of disc height, desiccation or "Black disc disease") 3. Pars defects 4. Spondylolisthesis, spondylosis, and/or spondyloarthropathies (include Degree/Grade of displacement in mm) 5. Vertebral body Fractures, including age (old, new/acute) 6. Modic Type Changes 7. Demineralization 8. Bone pathology 9. Central, Lateral Recess, and/or Foraminal Stenosis (include AP diameter of stenosis in mm) 10. Surgical changes (hardware type, status, and presence of fibrosis)  NOTE: Please specify level(s) and laterality.    Standing Status:   Future    Standing Expiration Date:   11/17/2019    Order Specific Question:   What is the patient's sedation requirement?    Answer:   No Sedation    Order Specific Question:   Does the patient have a pacemaker or implanted devices?    Answer:   No    Order Specific Question:   Preferred imaging location?    Answer:   [facility] (table limit-350lbs)    Order Specific Question:   Call Results- Best Contact Number?    Answer:   [phone] [facility])    Order Specific Question:   Radiology Contrast Protocol - do NOT remove file path    Answer:   [email or web address]   Follow-up plan:   Return in about 13 weeks (around 11/19/2019) for (VV), (MM), in addition, RFA (w/ sedation): (R) L-FCT RFA #1.       Interventional treatment options: Under consideration:   Therapeutic right lumbar facet RFA#1 Diagnostic bilateral SI joint block Diagnostic right IA hip joint injection Diagnostic bilateral genicular NB Possible bilateral genicular nerve RFA  Diagnostic left LESI  Diagnostic right CESI  Diagnostic bilateral cervical facet block  Diagnostic bilateral GONB  Possible greater occipital nerve RFA    Therapeutic/palliative (PRN):   Palliative left lumbar facet RFA #2 (last one done on 06/28/2019) Palliative left IA hip joint injection #2  Palliative bilateral lumbar facet block #5  Palliative bilateral IA Hyalgan knee injection #S2/N1 (last done on 11/08/2017)    Recent Visits Date Type Provider Dept  06/28/19 Procedure visit Delano MetzNaveira, Noraa Pickeral, MD [facility]  05/24/19 Office Visit Delano MetzNaveira, Caylen Yardley, MD [facility]  Showing recent visits within past 90 days and meeting all other requirements   Today's Visits Date Type Provider Dept  08/20/19 Telemedicine Delano MetzNaveira, Colonel Krauser, MD [facility]  Showing today's visits and meeting all other requirements   Future Appointments Date Type Provider Dept  08/23/19 Appointment Delano MetzNaveira, Toussaint Golson, MD [facility]  Showing future appointments within next 90 days and meeting all other requirements   I discussed the assessment and treatment plan with the patient. The patient was provided an opportunity to ask questions and all were answered. The patient agreed with the plan and demonstrated an understanding of the instructions.  Patient advised to call back or seek an in-person evaluation if the symptoms or condition worsens.  Duration of encounter: 15 minutes.  Note by: Oswaldo DoneFrancisco A Jovaun Levene, MD Date: 08/20/2019; Time: 10:21 AM

## 2019-08-20 ENCOUNTER — Ambulatory Visit: Payer: Medicaid Other | Attending: Pain Medicine | Admitting: Pain Medicine

## 2019-08-20 ENCOUNTER — Other Ambulatory Visit: Payer: Self-pay

## 2019-08-20 ENCOUNTER — Encounter: Payer: Self-pay | Admitting: Pain Medicine

## 2019-08-20 DIAGNOSIS — M25551 Pain in right hip: Secondary | ICD-10-CM

## 2019-08-20 DIAGNOSIS — M62830 Muscle spasm of back: Secondary | ICD-10-CM

## 2019-08-20 DIAGNOSIS — M1732 Unilateral post-traumatic osteoarthritis, left knee: Secondary | ICD-10-CM

## 2019-08-20 DIAGNOSIS — G8929 Other chronic pain: Secondary | ICD-10-CM | POA: Insufficient documentation

## 2019-08-20 DIAGNOSIS — M25552 Pain in left hip: Secondary | ICD-10-CM

## 2019-08-20 DIAGNOSIS — M5137 Other intervertebral disc degeneration, lumbosacral region: Secondary | ICD-10-CM | POA: Diagnosis not present

## 2019-08-20 DIAGNOSIS — R252 Cramp and spasm: Secondary | ICD-10-CM

## 2019-08-20 DIAGNOSIS — M47816 Spondylosis without myelopathy or radiculopathy, lumbar region: Secondary | ICD-10-CM

## 2019-08-20 DIAGNOSIS — G894 Chronic pain syndrome: Secondary | ICD-10-CM

## 2019-08-20 DIAGNOSIS — M5442 Lumbago with sciatica, left side: Secondary | ICD-10-CM | POA: Diagnosis not present

## 2019-08-20 DIAGNOSIS — M5136 Other intervertebral disc degeneration, lumbar region: Secondary | ICD-10-CM

## 2019-08-20 DIAGNOSIS — M5441 Lumbago with sciatica, right side: Secondary | ICD-10-CM

## 2019-08-20 MED ORDER — OXYCODONE HCL 5 MG PO TABS: 5.0000 mg | ORAL_TABLET | Freq: Three times a day (TID) | ORAL | 0 refills | Status: DC | PRN

## 2019-08-20 MED ORDER — TIZANIDINE HCL 4 MG PO CAPS: 4.0000 mg | ORAL_CAPSULE | Freq: Three times a day (TID) | ORAL | 5 refills | Status: DC | PRN

## 2019-08-20 NOTE — Patient Instructions (Signed)

## 2019-08-20 NOTE — Progress Notes (Signed)
Pain relief after procedure (treated area only): (Questions asked to patient) 1. Starting about 15 minutes after the procedure, and "while the area was still numb" (from the local anesthetics), were you having any of your usual pain "in that area" (the treated area)?  (NOTE: NOT including the discomfort from the needle sticks.) First 1 hour: 100 % better. First 4-6 hours: 100 % better.  3. How much better is your pain now, when compared to before the procedure? Current benefit 0 % better. 4. Can you move better now? Improvement in ROM (Range of Motion): No. 5. Can you do more now? Improvement in function: No. 4. Did you have any problems with the procedure? Side-effects/Complications: No.

## 2019-08-23 ENCOUNTER — Telehealth: Payer: Self-pay | Admitting: *Deleted

## 2019-08-23 ENCOUNTER — Ambulatory Visit: Payer: Medicaid Other | Admitting: Pain Medicine

## 2019-09-03 ENCOUNTER — Ambulatory Visit: Admission: RE | Admit: 2019-09-03 | Payer: Medicaid Other | Source: Ambulatory Visit

## 2019-10-02 ENCOUNTER — Telehealth: Payer: Self-pay | Admitting: Pain Medicine

## 2019-10-02 NOTE — Telephone Encounter (Signed)
Patient called about problem with getting medications/ states they need PA with medicaid.

## 2019-10-02 NOTE — Telephone Encounter (Signed)
Patient notified per voicemail that PA will be done for Oxycodone.

## 2019-10-04 ENCOUNTER — Telehealth: Payer: Self-pay | Admitting: Pain Medicine

## 2019-10-04 NOTE — Telephone Encounter (Signed)
Darl Pikes at pharmacy called stating patient's ins. Will pay for tablets of Tizanidine but not the capsules. Please call pharmacy and let them know if they can fill for tablets?

## 2019-10-04 NOTE — Telephone Encounter (Signed)
Pharmacy contacted, instructed to change Tizanidine to tablets.

## 2019-10-15 ENCOUNTER — Ambulatory Visit: Admission: RE | Admit: 2019-10-15 | Payer: Medicaid Other | Source: Ambulatory Visit

## 2019-10-30 ENCOUNTER — Ambulatory Visit: Payer: Medicaid Other | Admitting: Cardiovascular Disease

## 2019-10-30 NOTE — Progress Notes (Deleted)
NO SHOW

## 2019-10-31 ENCOUNTER — Encounter: Payer: Self-pay | Admitting: Cardiovascular Disease

## 2019-11-08 ENCOUNTER — Other Ambulatory Visit: Payer: Self-pay | Admitting: Cardiovascular Disease

## 2019-11-09 NOTE — Telephone Encounter (Signed)
Unable to LVM due to mailbox being full.

## 2019-11-09 NOTE — Telephone Encounter (Signed)
Please schedule 12 mo F/U with Dr. Gollan. Thank you! 

## 2019-11-12 ENCOUNTER — Encounter: Payer: Self-pay | Admitting: Pain Medicine

## 2019-11-12 NOTE — Progress Notes (Signed)
Patient: Erica [name]  Service Category: E/M  Provider: Gaspar Cola, MD  DOB: Jan 09, 1966  DOS: 11/13/2019  Location: Office  MRN: [id number]  Setting: Ambulatory outpatient  Referring Provider: Denton Lank, MD  Type: Established Patient  Specialty: Interventional Pain Management  PCP: Erica Lank, MD  Location: Remote location  Delivery: TeleHealth     Virtual Encounter - Pain Management PROVIDER NOTE: Information contained herein reflects review and annotations entered in association with encounter. Interpretation of such information and data should be left to medically-trained personnel. Information provided to patient can be located elsewhere in the medical record under "Patient Instructions". Document created using STT-dictation technology, any transcriptional errors that may result from process are unintentional.    Contact & Pharmacy Preferred: [phone] Home: [phone] (home) Mobile: [phone] (mobile) E-mail: [email or web address]  [id number] [place] [id number] [id number]: [phone]: [phone]  Pre-screening  Erica [name] "in-person" vs "virtual" encounter. She indicated preferring virtual for this encounter.   Reason COVID-19*  Social distancing based on CDC and AMA recommendations.   I contacted Erica [name] 11/13/2019 via telephone.      I clearly identified myself as FGaspar Cola MD. I verified that I was speaking with the correct person using two identifiers (Name: Erica [name] and date of birth: 2Jan 06, 1967.  Consent I sought verbal advanced consent from Erica [name] virtual visit interactions. I informed Erica [name] possible security and privacy concerns, risks, and limitations associated with providing "not-in-person" medical evaluation and management services. I also informed Erica [name] the availability of "in-person" appointments. Finally, I informed her  that there would be a charge for the virtual visit and that she could be  personally, fully or partially, financially responsible for it. Erica [name] understanding and agreed to proceed.   Historic Elements   Ms. Erica DUBBERLYis a 54y.o. year old, female patient evaluated today after her last contact with our practice on 10/04/2019. [name] has a past medical history of (HFpEF) heart failure with preserved ejection fraction ([facility], Acute pancreatitis (08/13/2018), Arthritis, Asthma, Bell's palsy, Bronchitis, CHF (congestive heart failure) ([place], COPD (chronic obstructive pulmonary disease) ([place], Diabetes mellitus without complication [place], Gastric ulcer, Hyperlipidemia, Hypertension, OSA (obstructive sleep apnea), Pancreatitis, and Shoulder injury. She also  has a past surgical history that includes Knee surgery (Left) and Flexible bronchoscopy (N/A, 06/20/2015). Ms. BKirkseyhas a current medication list which includes the following prescription(s): albuterol, aspirin, atorvastatin, accu-chek aviva plus, buspirone, vitamin d3, diltiazem, diltiazem, duloxetine, duloxetine, fluticasone-salmeterol, furosemide, magnesium, metformin, [START ON 11/19/2019] oxycodone, [START ON 12/19/2019] oxycodone, [START ON 01/18/2020] oxycodone, oxygen-helium, pantoprazole, [START ON 11/19/2019] pregabalin, sucralfate, tiotropium, and tizanidine. She  reports that she quit smoking about 4 years ago. Her smoking use included cigarettes. She has a 36.00 pack-year smoking history. She has never used smokeless tobacco. She reports that she does not drink alcohol or use drugs. Ms. BBehanis allergic to tramadol and penicillins.   HPI  Today, she is being contacted for medication management. The patient indicates doing well with the current medication regimen. No adverse reactions or side effects reported to the medications.  Today we have [facility] [place] in order to avoid the  problems that we have been experiencing with that unreliable supply of opioid analgesics on the part of [facility].  Pharmacotherapy Assessment  Analgesic: Oxycodone IR  5 mg, 1 tab PO q 8 hrs (48m/dayof oxycodone) MME/day:22.520mday.   Monitoring: [place] PMP: PDMP reviewed during this encounter.       Pharmacotherapy: No side-effects or adverse reactions reported. Compliance: No problems identified. Effectiveness: Clinically acceptable. Plan: Refer to "POC".  UDS:  Summary  Date Value Ref Range Status  08/29/2018 FINAL  Final    Comment:    ==================================================================== TOXASSURE SELECT 13 (MW) ==================================================================== Test                             Result       Flag       Units   NO DRUGS DETECTED. ==================================================================== Test                      Result    Flag   Units      Ref Range   Creatinine              167              mg/dL      >=20 ==================================================================== Declared Medications:  The flagging and interpretation on this report are based on the  following declared medications.  Unexpected results may arise from  inaccuracies in the declared medications.  **Note: The testing scope of this panel does not include following  reported medications:  Albuterol  Aspirin  Atorvastatin  Calcium  Cholecalciferol  Diltiazem  Duloxetine  Fluconazole  Fluticasone  Folic acid ==================================================================== For clinical consultation, please call ([phone]====================================================================    Laboratory Chemistry Profile   Renal Lab Results  Component Value Date   BUN 7 09/12/2018   CREATININE 0.56 09/12/2018   LABCREA 138 06/14/2015   BCR 15 04/22/2016   GFRAA >60 09/12/2018   GFRNONAA >60 09/12/2018      Hepatic Lab Results  Component Value Date   AST 35 09/10/2018   ALT 32 09/10/2018   ALBUMIN 3.5 09/10/2018   ALKPHOS 121 09/10/2018   HCVAB <0.1 08/12/2018   LIPASE 38 09/10/2018     Electrolytes Lab Results  Component Value Date   NA 138 09/12/2018   K 3.8 09/12/2018   CL 104 09/12/2018   CALCIUM 8.6 (L) 09/12/2018   MG 2.0 09/12/2018   PHOS 1.1 (L) 09/12/2018     Bone Lab Results  Component Value Date   25OHVITD1 15 (L) 04/30/2016   25OHVITD2 <1.0 04/30/2016   25OHVITD3 15 04/30/2016     Inflammation (CRP: Acute Phase) (ESR: Chronic Phase) Lab Results  Component Value Date   CRP 1.3 (H) 04/30/2016   ESRSEDRATE 31 (H) 04/30/2016       Note: Above Lab results reviewed.  Imaging  Fluoro (C-Arm) (<60 min) (No Report) Fluoro was used, but no Radiologist interpretation will be provided.  Please refer to "NOTES" tab for provider progress note.  Assessment  The primary encounter diagnosis was Chronic pain syndrome. 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  Plan of Care  Problem-specific:  No problem-specific Assessment & Plan notes found for this encounter.  Ms. DoAIANA NORDQUISTas a current medication list which includes the following long-term medication(s): atorvastatin, vitamin d3, diltiazem, diltiazem, duloxetine, duloxetine, fluticasone-salmeterol, furosemide, metformin, [START ON 11/19/2019] oxycodone, [START ON 12/19/2019] oxycodone, [START ON  01/18/2020] oxycodone, pantoprazole, [START ON 11/19/2019] pregabalin, sucralfate, tiotropium, and tizanidine.  Pharmacotherapy (Medications Ordered): Meds ordered this encounter  Medications  . oxyCODONE (OXY IR/ROXICODONE) 5 MG immediate release tablet    Sig: Take 1 tablet (5 mg total) by mouth  every 8 (eight) hours as needed for severe pain. Must last 30 days    Dispense:  90 tablet    Refill:  0    Chronic Pain: STOP Act (Not applicable) Fill 1 day early if closed on refill date. Do not fill until: 11/19/2019. To last until: 12/19/2019. Avoid benzodiazepines within 8 hours of opioids  . oxyCODONE (OXY IR/ROXICODONE) 5 MG immediate release tablet    Sig: Take 1 tablet (5 mg total) by mouth every 8 (eight) hours as needed for severe pain. Must last 30 days    Dispense:  90 tablet    Refill:  0    Chronic Pain: STOP Act (Not applicable) Fill 1 day early if closed on refill date. Do not fill until: 12/19/2019. To last until: 01/18/2020. Avoid benzodiazepines within 8 hours of opioids  . oxyCODONE (OXY IR/ROXICODONE) 5 MG immediate release tablet    Sig: Take 1 tablet (5 mg total) by mouth every 8 (eight) hours as needed for severe pain. Must last 30 days    Dispense:  90 tablet    Refill:  0    Chronic Pain: STOP Act (Not applicable) Fill 1 day early if closed on refill date. Do not fill until: 01/18/2020. To last until: 02/17/2020. Avoid benzodiazepines within 8 hours of opioids  . pregabalin (LYRICA) 150 MG capsule    Sig: Take 1 capsule (150 mg total) by mouth 3 (three) times daily.    Dispense:  90 capsule    Refill:  5    Fill one day early if pharmacy is closed on scheduled refill date. May substitute for generic if available.   Orders:  Orders Placed This Encounter  Procedures  . ToxASSURE Select 13 (MW), Urine    Volume: 30 ml(s). Minimum 3 ml of urine is needed. Document temperature of fresh sample. Indications: Long term (current) use of opiate analgesic (X32.440)    Order Specific Question:   Release to patient    Answer:   Immediate   Follow-up plan:   Return in about 3 months (around 02/13/2020) for (F2F), (MM).      Interventional treatment options: Under consideration:   Therapeutic right lumbar facet RFA#1 Diagnostic bilateral SI joint block Diagnostic right IA hip  joint injection Diagnostic bilateral genicular NB Possible bilateral genicular nerve RFA  Diagnostic left LESI  Diagnostic right CESI  Diagnostic bilateral cervical facet block  Diagnostic bilateral GONB  Possible greater occipital nerve RFA    Therapeutic/palliative (PRN):   Palliative left lumbar facet RFA #2 (last one done on 06/28/2019) Palliative left IA hip joint injection #2  Palliative bilateral lumbar facet block #5  Palliative bilateral IA Hyalgan knee injection #S2/N1 (last done on 11/08/2017)     Recent Visits Date Type Provider Dept  08/20/19 Telemedicine Milinda Pointer, MD [facility]  Showing recent visits within past 90 days and meeting all other requirements   Today's Visits Date Type Provider Dept  11/13/19 Telemedicine Milinda Pointer, MD [facility]  Showing today's visits and meeting all other requirements   Future Appointments No visits were found meeting these conditions.  Showing future appointments within next 90 days and meeting all other requirements   I discussed the assessment and treatment plan  with the patient. The patient was provided an opportunity to ask questions and all were answered. The patient agreed with the plan and demonstrated an understanding of the instructions.  Patient advised to call back or seek an in-person evaluation if the symptoms or condition worsens.  Duration of encounter: 13 minutes.  Note by: Erica Cola, MD Date: 11/13/2019; Time: 9:21 AM

## 2019-11-13 ENCOUNTER — Ambulatory Visit: Payer: Medicaid Other | Attending: Pain Medicine | Admitting: Pain Medicine

## 2019-11-13 ENCOUNTER — Other Ambulatory Visit: Payer: Self-pay

## 2019-11-13 ENCOUNTER — Telehealth: Payer: Self-pay | Admitting: *Deleted

## 2019-11-13 DIAGNOSIS — M25551 Pain in right hip: Secondary | ICD-10-CM

## 2019-11-13 DIAGNOSIS — M25552 Pain in left hip: Secondary | ICD-10-CM

## 2019-11-13 DIAGNOSIS — M5441 Lumbago with sciatica, right side: Secondary | ICD-10-CM

## 2019-11-13 DIAGNOSIS — G894 Chronic pain syndrome: Secondary | ICD-10-CM | POA: Diagnosis not present

## 2019-11-13 DIAGNOSIS — M5442 Lumbago with sciatica, left side: Secondary | ICD-10-CM

## 2019-11-13 DIAGNOSIS — G609 Hereditary and idiopathic neuropathy, unspecified: Secondary | ICD-10-CM | POA: Diagnosis not present

## 2019-11-13 DIAGNOSIS — Z79899 Other long term (current) drug therapy: Secondary | ICD-10-CM | POA: Insufficient documentation

## 2019-11-13 DIAGNOSIS — G8929 Other chronic pain: Secondary | ICD-10-CM

## 2019-11-13 DIAGNOSIS — M792 Neuralgia and neuritis, unspecified: Secondary | ICD-10-CM

## 2019-11-13 MED ORDER — PREGABALIN 150 MG PO CAPS: 150.0000 mg | ORAL_CAPSULE | Freq: Three times a day (TID) | ORAL | 5 refills | Status: DC

## 2019-11-13 MED ORDER — OXYCODONE HCL 5 MG PO TABS: 5.0000 mg | ORAL_TABLET | Freq: Three times a day (TID) | ORAL | 0 refills | Status: DC | PRN

## 2019-11-14 NOTE — Telephone Encounter (Signed)
Attempted to schedule no ans no vm  

## 2019-11-19 ENCOUNTER — Telehealth: Payer: Medicaid Other | Admitting: Pain Medicine

## 2019-11-19 NOTE — Telephone Encounter (Signed)
Attempted to schedule no ans no vm ? ? ?3 attempts to schedule fu appt from recall list.   Deleting recall.  ? ?

## 2019-11-19 NOTE — Telephone Encounter (Signed)
Closing encounter

## 2019-11-26 ENCOUNTER — Other Ambulatory Visit: Payer: Self-pay | Admitting: *Deleted

## 2019-11-26 MED ORDER — DILTIAZEM HCL 30 MG PO TABS: ORAL_TABLET | ORAL | 0 refills | Status: DC

## 2019-12-14 ENCOUNTER — Telehealth: Payer: Self-pay

## 2019-12-14 ENCOUNTER — Other Ambulatory Visit: Payer: Self-pay | Admitting: Cardiovascular Disease

## 2019-12-14 NOTE — Telephone Encounter (Signed)
Called patient, not able to schedule "ole man" is in the hospital

## 2019-12-14 NOTE — Telephone Encounter (Signed)
Refill sent for one month supply and the patient is to call for a follow up with Dr. Mariah Milling. The patient's husband is in the hospital and can't schedule an appointment at this time.

## 2019-12-14 NOTE — Telephone Encounter (Signed)
Please schedule overdue F/U office visit with Dr. Mariah Milling. Thank you!

## 2019-12-19 ENCOUNTER — Telehealth: Payer: Self-pay

## 2019-12-19 NOTE — Telephone Encounter (Signed)
Her PCP is putting her on ativan and she wanted to make sure that wasn't going to prevent her from getting her pain meds.

## 2019-12-19 NOTE — Telephone Encounter (Signed)
Spoke back with patient to let her know that taking ativan does not break the contract.  She states her SO is in the hospital in the process of dying.  States she has not been able to get over for urine test d/t that.  I told her to come when she could and if she needed anything to let us know.

## 2019-12-21 ENCOUNTER — Other Ambulatory Visit: Payer: Self-pay | Admitting: Cardiovascular Disease

## 2019-12-21 MED ORDER — DILTIAZEM HCL 30 MG PO TABS: 30.0000 mg | ORAL_TABLET | Freq: Three times a day (TID) | ORAL | 0 refills | Status: DC | PRN

## 2019-12-21 NOTE — Telephone Encounter (Signed)
*  STAT* If patient is at the pharmacy, call can be transferred to refill team.   1. Which medications need to be refilled? (please list name of each medication and dose if known) diltiazem 30 3x/day  2. Which pharmacy/location (including street and city if local pharmacy) is medication to be sent to? [facility] [address]  3. Do they need a 30 day or 90 day supply? 30 days  Patient knows that she is past due for appointment but has been in then hospital with significant other. Patients significant other passed yesterday and patient is unable to come in at this time due to grieving and funeral arrangements

## 2019-12-21 NOTE — Telephone Encounter (Signed)
Requested Prescriptions   Signed Prescriptions Disp Refills  . diltiazem (CARDIZEM) 30 MG tablet 90 tablet 0    Sig: Take 1 tablet (30 mg total) by mouth 3 (three) times daily as needed.    Authorizing Provider: Antonieta Iba    Ordering User: Kendrick Fries

## 2019-12-30 ENCOUNTER — Other Ambulatory Visit: Payer: Self-pay | Admitting: Cardiovascular Disease

## 2020-01-23 ENCOUNTER — Ambulatory Visit: Payer: Medicaid Other | Admitting: Family

## 2020-01-24 ENCOUNTER — Encounter: Payer: Self-pay | Admitting: Family

## 2020-01-30 ENCOUNTER — Ambulatory Visit: Payer: Medicaid Other | Admitting: Family

## 2020-02-01 ENCOUNTER — Ambulatory Visit: Payer: Medicaid Other | Admitting: Family

## 2020-02-10 NOTE — Progress Notes (Signed)
PROVIDER NOTE: Information contained herein reflects review and annotations entered in association with encounter. Interpretation of such information and data should be left to medically-trained personnel. Information provided to patient can be located elsewhere in the medical record under "Patient Instructions". Document created using STT-dictation technology, any transcriptional errors that may result from process are unintentional.    Patient: Erica [name]  Service Category: E/M  Provider: Gaspar Cola, MD  DOB: [birth year]  DOS: 02/11/2020  Specialty: Interventional Pain Management  MRN: [id number]  Setting: Ambulatory outpatient  PCP: Denton Lank, MD  Type: Established Patient    Referring Provider: Denton Lank, MD  Location: Office  Delivery: Face-to-face     HPI  Reason for encounter: Erica [name], a 54 y.o. year old female, is here today for evaluation and management of her Chronic pain syndrome [G89.4]. Erica [name] primary complain today is Back Pain (low), Hip Pain (left), and Knee Pain (bilateral) Last encounter: Practice (12/19/2019). My last encounter with her was on 10/04/2019. Pertinent problems: Erica [name] has Chronic low back pain (Primary Area of Pain) (Bilateral) (L>R); Chronic knee pain (Third area of Pain) (Bilateral) (L>R); Chronic neck pain (Bilateral) (R>L); Chronic upper back pain (midline); Chronic foot pain (bottom of feet) (Bilateral) (R>L); Chronic hand pain (Bilateral); Peripheral neuropathy, idiopathic (upper and lower extremity); Chronic sacroiliac joint pain (Bilateral) (R>L); Lumbar facet syndrome (Bilateral) (R>L); Chronic pain syndrome; Neurogenic pain; Lumbar spondylosis; Osteoarthritis of knee (Bilateral) (L>R); Intermittent left thoracic Muscle cramps; Spasm of thoracolumbar muscle (Left); Post-traumatic osteoarthritis of knee (Left); Hemarthrosis, left knee; 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 Pain Assessment: Severity of   is reported as a 4 /10. Location: Back Lower/left hip and knees. Onset: More than a month ago. Quality: Constant, Aching, Shooting. Timing: Constant. Modifying factor(s): medications, positioning,. Vitals:  height is _0  (1.727 m) and weight is 198 lb (89.8 kg). Her temporal temperature is 97.3 F (36.3 C) (abnormal). Her blood pressure is 145/84 (abnormal) and her pulse is 89. Her respiration is 18 and oxygen saturation is 95%.   The patient comes into the clinic today indicated that her Percocet is not working like it should.  This triggered a long conversation regarding opioid analgesic tolerance and the use of "Drug Holidays" to treat this.  She indicates that she is experiencing more pain in the lower back and she thinks that the radiofrequency that we did last, might have not worked as well.  She indicates having more pain in the lower back, bilaterally, with the left being worse than the right.  She is also having pain in the hips and knees.  According to the patient she is having quite a bit of left hip pain.  In terms of the knees, she refers that she has been having problems with the left knee where she has had some prior surgeries and she has had several falls were the screws have punctured her skin.  I asked her to show me where this happened and she does have some little lesions, but none of them would represent a frank puncture of the skin due to a screw.  In any case,  today we will go ahead and order some x-rays of that left knee.  Physical exam today was positive for  bilateral SI joint pain upon performing the Patrick maneuver.  The patient also had positive pain arising from both hips, also on the Patrick maneuver with decrease range of motion of her hip and her left knee.  She does seem to have some pain also coming from the facet joints, but her primary pain today seems to be the hips and that what she would like to get treated first.  We will follow this with treatment of her SI joint and then the facet joints.  It is very likely that the reason why she thinks the radiofrequency may have not worked for her is because she is having pain in the SI joints, which referred pain to the same area as the facet joints.  Today we will order x-rays of both of her hips and her left knee and we will have her come back for a bilateral intra-articular hip joint injection under fluoroscopic guidance and IV sedation.  Pharmacotherapy Assessment   Analgesic: Oxycodone IR 5 mg, 1 tab PO q 8 hrs (37m/dayof oxycodone) MME/day:22.516mday.   Monitoring: [employer] PMP: PDMP reviewed during this encounter.       Pharmacotherapy: No side-effects or adverse reactions reported. Compliance: No problems identified. Effectiveness: Clinically acceptable.  ShHart RochesterRN  02/11/2020  9:24 AM  Sign when Signing Visit Nursing Pain Medication Assessment:  Safety precautions to be maintained throughout the outpatient stay will include: orient to surroundings, keep bed in low position, maintain call bell within reach at all times, provide assistance with transfer out of bed and ambulation.  Medication Inspection Compliance: Pill count conducted under aseptic conditions, in front of the patient. Neither the pills nor the bottle was removed from the patient's sight at any time. Once count was completed pills were immediately returned to the patient in their original bottle.  Medication: Oxycodone IR Pill/Patch Count: 23 of 90 pills remain Pill/Patch Appearance: Markings consistent with  prescribed medication Bottle Appearance: Standard pharmacy container. Clearly labeled. Filled Date: 07 / 02 / 2021 Last Medication intake:  Today    UDS:  Summary  Date Value Ref Range Status  08/29/2018 FINAL  Final    Comment:    ==================================================================== TOXASSURE SELECT 13 (MW) ==================================================================== Test                             Result       Flag       Units   NO DRUGS DETECTED. ==================================================================== Test                      Result    Flag   Units      Ref Range   Creatinine              167              mg/dL      >=20 ==================================================================== Declared Medications:  The flagging and interpretation on this report are based on the  following declared medications.  Unexpected results may arise from  inaccuracies in the declared medications.  **Note: The testing scope of this panel does not include following  reported medications:  Albuterol  Aspirin  Atorvastatin  Calcium  Cholecalciferol  Diltiazem  Duloxetine  Fluconazole  Fluticasone  Folic acid ==================================================================== For clinical consultation, please call ([phone]====  ROS  Constitutional: Denies any fever or chills Gastrointestinal: No reported hemesis, hematochezia, vomiting, or acute GI distress Musculoskeletal: Denies any acute onset joint swelling, redness, loss of ROM, or weakness Neurological: No reported episodes of acute onset apraxia, aphasia, dysarthria, agnosia, amnesia, paralysis, loss of coordination, or loss of consciousness  Medication Review  Accu-Chek Aviva Plus, DULoxetine, Fluticasone-Salmeterol, Magnesium, Oxygen-Helium, Vitamin D3, albuterol, aspirin, atorvastatin, busPIRone, diltiazem,  furosemide, metFORMIN, oxyCODONE, pantoprazole, pregabalin, sucralfate, tiZANidine, and tiotropium  History Review  Allergy: Erica [name] is allergic to tramadol and penicillins. Drug: Erica [name]  reports no history of drug use. Alcohol:  reports no history of alcohol use. Tobacco:  reports that she quit smoking about 4 years ago. Her smoking use included cigarettes. She has a 36.00 pack-year smoking history. She has never used smokeless tobacco. Social: Erica [name]  reports that she quit smoking about 4 years ago. Her smoking use included cigarettes. She has a 36.00 pack-year smoking history. She has never used smokeless tobacco. She reports that she does not drink alcohol and does not use drugs. Medical:  has a past medical history of (HFpEF) heart failure with preserved ejection fraction ([place]), Acute pancreatitis (08/13/2018), Arthritis, Asthma, Bell's palsy, Bronchitis, CHF (congestive heart failure) ([place]), COPD (chronic obstructive pulmonary disease) ([place]), Diabetes mellitus without complication ([place]), Gastric ulcer, Hyperlipidemia, Hypertension, OSA (obstructive sleep apnea), Pancreatitis, and Shoulder injury. Surgical: Erica [name]  has a past surgical history that includes Knee surgery (Left) and Flexible bronchoscopy (N/A, 06/20/2015). Family: family history includes Breast cancer in her mother; Diabetes in her maternal grandmother; Lung cancer in her father.  Laboratory Chemistry Profile   Renal Lab Results  Component Value Date   BUN 7 09/12/2018   CREATININE 0.56 09/12/2018   LABCREA 138 06/14/2015   BCR 15 04/22/2016   GFRAA >60 09/12/2018   GFRNONAA >60 09/12/2018     Hepatic Lab Results  Component Value Date   AST 35 09/10/2018   ALT 32 09/10/2018   ALBUMIN 3.5 09/10/2018   ALKPHOS 121 09/10/2018   HCVAB <0.1 08/12/2018   LIPASE 38 09/10/2018     Electrolytes Lab Results  Component Value Date   NA 138 09/12/2018   K 3.8 09/12/2018   CL 104 09/12/2018   CALCIUM 8.6 (L)  09/12/2018   MG 2.0 09/12/2018   PHOS 1.1 (L) 09/12/2018     Bone Lab Results  Component Value Date   25OHVITD1 15 (L) 04/30/2016   25OHVITD2 <1.0 04/30/2016   25OHVITD3 15 04/30/2016     Inflammation (CRP: Acute Phase) (ESR: Chronic Phase) Lab Results  Component Value Date   CRP 1.3 (H) 04/30/2016   ESRSEDRATE 31 (H) 04/30/2016       Note: Above Lab results reviewed.  Recent Imaging Review  DG Knee 1-2 Views Left CLINICAL DATA:  Left knee pain after remote trauma  EXAM: LEFT KNEE - 1-2 VIEW  COMPARISON:  12/10/2016  FINDINGS: Again noted are 2 intact threaded screws within the tibial tuberosity. No perihardware lucency or fracture. Mild tricompartmental joint space narrowing. Small to moderate left knee joint effusion. No focal soft tissue swelling.  IMPRESSION: 1. Mild tricompartmental osteoarthritis of the left knee with small to moderate joint effusion. 2. Intact hardware at the tibial tuberosity.  Electronically Signed   By: Davina Poke D.O.   On: 02/11/2020 15:59 DG HIP UNILAT W OR W/O PELVIS 2-3 VIEWS LEFT CLINICAL DATA:  Chronic bilateral hip pain  EXAM: DG HIP (WITH OR WITHOUT PELVIS) 2-3V RIGHT; DG HIP (  WITH OR WITHOUT PELVIS) 2-3V LEFT  COMPARISON:  04/30/2016  FINDINGS: No evidence of acute fracture or dislocation. There is moderate joint space narrowing of the right hip and mild joint space narrowing of the left hip with minimal interval progression from prior. Mild arthropathy of the bilateral SI joints. Nonspecific benign-appearing soft tissue calcification within the lateral soft tissues of the proximal left thigh measuring 2.4 cm in diameter. Soft tissues appear otherwise within normal limits.  IMPRESSION: Moderate right and mild left hip osteoarthritis, with minimal interval progression from prior.  Electronically Signed   By: Davina Poke D.O.   On: 02/11/2020 15:50 DG HIP UNILAT W OR W/O PELVIS 2-3 VIEWS RIGHT CLINICAL  DATA:  Chronic bilateral hip pain  EXAM: DG HIP (WITH OR WITHOUT PELVIS) 2-3V RIGHT; DG HIP (WITH OR WITHOUT PELVIS) 2-3V LEFT  COMPARISON:  04/30/2016  FINDINGS: No evidence of acute fracture or dislocation. There is moderate joint space narrowing of the right hip and mild joint space narrowing of the left hip with minimal interval progression from prior. Mild arthropathy of the bilateral SI joints. Nonspecific benign-appearing soft tissue calcification within the lateral soft tissues of the proximal left thigh measuring 2.4 cm in diameter. Soft tissues appear otherwise within normal limits.  IMPRESSION: Moderate right and mild left hip osteoarthritis, with minimal interval progression from prior.  Electronically Signed   By: Davina Poke D.O.   On: 02/11/2020 15:50 Note: Reviewed        Physical Exam  General appearance: Well nourished, well developed, and well hydrated. In no apparent acute distress Mental status: Alert, oriented x 3 (person, place, & time)       Respiratory: No evidence of acute respiratory distress Eyes: PERLA Vitals: BP (!) 145/84   Pulse 89   Temp (!) 97.3 F (36.3 C) (Temporal)   Resp 18   Ht _0  (1.727 m)   Wt 198 lb (89.8 kg)   LMP 03/13/2015 (Approximate) Comment: more than 2 years ago  SpO2 95%   BMI 30.11 kg/m  BMI: Estimated body mass index is 30.11 kg/m as calculated from the following:   Height as of this encounter: _1  (1.727 m).   Weight as of this encounter: 198 lb (89.8 kg). Ideal: Ideal body weight: 63.9 kg (140 lb 14 oz) Adjusted ideal body weight: 74.3 kg (163 lb 11.6 oz)  Assessment   Status Diagnosis  Controlled Controlled Controlled 1. Chronic pain syndrome   2. Chronic low back pain (Primary Area of Pain) (Bilateral) (L>R)   3. Chronic hip pain (Secondary area of Pain) (Bilateral) (L>R)   4. Chronic knee pain (Third area of Pain) (Bilateral) (L>R)   5. Intermittent left thoracic Muscle cramps   6. Spasm of  thoracolumbar muscle (Left)   7. Post-traumatic osteoarthritis of knee (Left)   8. Pharmacologic therapy   9. Peripheral neuropathy, idiopathic (upper and lower extremity)   10. Neurogenic pain      Updated Problems: No problems updated.  Plan of Care  Problem-specific:  No problem-specific Assessment & Plan notes found for this encounter.  Erica [name] has a current medication list which includes the following long-term medication(s): atorvastatin, vitamin d3, diltiazem, diltiazem, duloxetine, duloxetine, fluticasone-salmeterol, furosemide, metformin, [START ON 02/17/2020] oxycodone, pantoprazole, pregabalin, sucralfate, tiotropium, and [START ON 02/16/2020] tizanidine.  Pharmacotherapy (Medications Ordered): Meds ordered this encounter  Medications  . oxyCODONE (OXY IR/ROXICODONE) 5 MG immediate release tablet    Sig: Take 1 tablet (5 mg total) by  mouth every 8 (eight) hours as needed for severe pain. Must last 30 days    Dispense:  90 tablet    Refill:  0    Chronic Pain: STOP Act (Not applicable) Fill 1 day early if closed on refill date. Do not fill until: 02/17/2020. To last until: 03/18/2020. Avoid benzodiazepines within 8 hours of opioids  . tiZANidine (ZANAFLEX) 4 MG capsule    Sig: Take 1 capsule (4 mg total) by mouth 3 (three) times daily as needed for muscle spasms.    Dispense:  90 capsule    Refill:  5    Do not place this medication, or any other prescription from our practice, on "Automatic Refill". Patient may have prescription filled one day early if pharmacy is closed on scheduled refill date.  . pregabalin (LYRICA) 150 MG capsule    Sig: Take 1 capsule (150 mg total) by mouth 3 (three) times daily.    Dispense:  90 capsule    Refill:  5    Fill one day early if pharmacy is closed on scheduled refill date. May substitute for generic if available.   Orders:  Orders Placed This Encounter  Procedures  . HIP INJECTION    Standing Status:   Future    Standing  Expiration Date:   05/13/2020    Scheduling Instructions:     Side: Bilateral     Sedation: Patient's choice.     Timeframe: As soon as schedule allows  . DG HIP UNILAT W OR W/O PELVIS 2-3 VIEWS RIGHT    Standing Status:   Future    Number of Occurrences:   1    Standing Expiration Date:   02/10/2021    Scheduling Instructions:     Please describe any evidence of DJD, such as joint narrowing, asymmetry, cysts, or any anomalies in bone density, production, or erosion.    Order Specific Question:   Reason for Exam (SYMPTOM  OR DIAGNOSIS REQUIRED)    Answer:   Right hip pain/arthralgia    Order Specific Question:   Is the patient pregnant?    Answer:   No    Order Specific Question:   Preferred imaging location?    Answer:   [facility]    Order Specific Question:   Call Results- Best Contact Number?    Answer:   [phone] (Pain Clinic facility) (Dr. Dossie Arbour)  . DG HIP UNILAT W OR W/O PELVIS 2-3 VIEWS LEFT    Standing Status:   Future    Number of Occurrences:   1    Standing Expiration Date:   02/10/2021    Scheduling Instructions:     Please describe any evidence of DJD, such as joint narrowing, asymmetry, cysts, or any anomalies in bone density, production, or erosion.    Order Specific Question:   Reason for Exam (SYMPTOM  OR DIAGNOSIS REQUIRED)    Answer:   Right hip pain/arthralgia    Order Specific Question:   Is the patient pregnant?    Answer:   No    Order Specific Question:   Preferred imaging location?    Answer:   [facility]    Order Specific Question:   Call Results- Best Contact Number?    Answer:   [phone] (Pain Clinic facility) (Dr. Dossie Arbour)  . DG Knee 1-2 Views Left    Standing Status:   Future    Number of Occurrences:   1    Standing Expiration Date:   02/10/2021  Order Specific Question:   Reason for Exam (SYMPTOM  OR DIAGNOSIS REQUIRED)    Answer:   Right hip pain/arthralgia    Order Specific Question:   Is the patient pregnant?     Answer:   No    Order Specific Question:   Preferred imaging location?    Answer:   [facility]    Order Specific Question:   Call Results- Best Contact Number?    Answer:   [phone] (Pain Clinic facility) (Dr. Dossie Arbour)  . ToxASSURE Select 13 (MW), Urine    Volume: 30 ml(s). Minimum 3 ml of urine is needed. Document temperature of fresh sample. Indications: Long term (current) use of opiate analgesic (J08.569)    Order Specific Question:   Release to patient    Answer:   Immediate   Follow-up plan:   Return for Procedure (w/ sedation): (B) Hip inj., (ASAP).      Interventional treatment options: Under consideration:   Therapeutic right lumbar facet RFA#1 Diagnostic bilateral SI joint block Diagnostic right IA hip joint injection Diagnostic bilateral genicular NB Possible bilateral genicular nerve RFA  Diagnostic left LESI  Diagnostic right CESI  Diagnostic bilateral cervical facet block  Diagnostic bilateral GONB  Possible greater occipital nerve RFA    Therapeutic/palliative (PRN):   Palliative left lumbar facet RFA #2 (last one done on 06/28/2019) Palliative left IA hip joint injection #2  Palliative bilateral lumbar facet block #5  Palliative bilateral IA Hyalgan knee injection #S2/N1 (last done on 11/08/2017)    Recent Visits Date Type Provider Dept  11/13/19 Telemedicine Milinda Pointer, MD [facility]  Showing recent visits within past 90 days and meeting all other requirements Today's Visits Date Type Provider Dept  02/11/20 Office Visit Milinda Pointer, MD [facility]  Showing today's visits and meeting all other requirements Future Appointments No visits were found meeting these conditions. Showing future appointments within next 90 days and meeting all other requirements  I discussed the assessment and treatment plan with the patient. The patient was provided an opportunity to ask questions and all were answered. The  patient agreed with the plan and demonstrated an understanding of the instructions.  Patient advised to call back or seek an in-person evaluation if the symptoms or condition worsens.  Duration of encounter: 30 minutes.  Note by: Gaspar Cola, MD Date: 02/11/2020; Time: 4:07 PM

## 2020-02-11 ENCOUNTER — Other Ambulatory Visit: Payer: Self-pay

## 2020-02-11 ENCOUNTER — Telehealth: Payer: Self-pay

## 2020-02-11 ENCOUNTER — Ambulatory Visit
Admission: RE | Admit: 2020-02-11 | Discharge: 2020-02-11 | Disposition: A | Discharge status: Home / Self Care | Payer: Medicaid Other | Attending: Pain Medicine | Admitting: Pain Medicine

## 2020-02-11 ENCOUNTER — Ambulatory Visit (HOSPITAL_BASED_OUTPATIENT_CLINIC_OR_DEPARTMENT_OTHER): Payer: Medicaid Other | Admitting: Pain Medicine

## 2020-02-11 ENCOUNTER — Encounter: Payer: Self-pay | Admitting: Pain Medicine

## 2020-02-11 ENCOUNTER — Ambulatory Visit
Admission: RE | Admit: 2020-02-11 | Discharge: 2020-02-11 | Disposition: A | Discharge status: Home / Self Care | Payer: Medicaid Other | Source: Ambulatory Visit | Attending: Pain Medicine | Admitting: Pain Medicine

## 2020-02-11 VITALS — BP 145/84 | HR 89 | Temp 97.3°F | Resp 18 | Ht 68.0 in | Wt 198.0 lb

## 2020-02-11 DIAGNOSIS — M1710 Unilateral primary osteoarthritis, unspecified knee: Secondary | ICD-10-CM | POA: Insufficient documentation

## 2020-02-11 DIAGNOSIS — M1732 Unilateral post-traumatic osteoarthritis, left knee: Secondary | ICD-10-CM

## 2020-02-11 DIAGNOSIS — M25551 Pain in right hip: Secondary | ICD-10-CM | POA: Insufficient documentation

## 2020-02-11 DIAGNOSIS — G894 Chronic pain syndrome: Secondary | ICD-10-CM | POA: Insufficient documentation

## 2020-02-11 DIAGNOSIS — M25552 Pain in left hip: Secondary | ICD-10-CM

## 2020-02-11 DIAGNOSIS — M545 Low back pain: Secondary | ICD-10-CM | POA: Insufficient documentation

## 2020-02-11 DIAGNOSIS — G8929 Other chronic pain: Secondary | ICD-10-CM | POA: Insufficient documentation

## 2020-02-11 DIAGNOSIS — G609 Hereditary and idiopathic neuropathy, unspecified: Secondary | ICD-10-CM

## 2020-02-11 DIAGNOSIS — Z79899 Other long term (current) drug therapy: Secondary | ICD-10-CM

## 2020-02-11 DIAGNOSIS — M25561 Pain in right knee: Secondary | ICD-10-CM | POA: Insufficient documentation

## 2020-02-11 DIAGNOSIS — M1712 Unilateral primary osteoarthritis, left knee: Secondary | ICD-10-CM

## 2020-02-11 DIAGNOSIS — M25562 Pain in left knee: Secondary | ICD-10-CM

## 2020-02-11 DIAGNOSIS — R252 Cramp and spasm: Secondary | ICD-10-CM

## 2020-02-11 DIAGNOSIS — M792 Neuralgia and neuritis, unspecified: Secondary | ICD-10-CM | POA: Insufficient documentation

## 2020-02-11 DIAGNOSIS — M62830 Muscle spasm of back: Secondary | ICD-10-CM

## 2020-02-11 MED ORDER — PREGABALIN 150 MG PO CAPS: 150.0000 mg | ORAL_CAPSULE | Freq: Three times a day (TID) | ORAL | 5 refills | Status: DC

## 2020-02-11 MED ORDER — TIZANIDINE HCL 4 MG PO CAPS: 4.0000 mg | ORAL_CAPSULE | Freq: Three times a day (TID) | ORAL | 5 refills | Status: DC | PRN

## 2020-02-11 MED ORDER — OXYCODONE HCL 5 MG PO TABS: 5.0000 mg | ORAL_TABLET | Freq: Three times a day (TID) | ORAL | 0 refills | Status: DC | PRN

## 2020-02-11 NOTE — Telephone Encounter (Signed)
Refills of Lyrica  (4) cancelled via phone  Voicemail per Dr Laban Emperor.

## 2020-02-11 NOTE — Progress Notes (Signed)
Nursing Pain Medication Assessment:  Safety precautions to be maintained throughout the outpatient stay will include: orient to surroundings, keep bed in low position, maintain call bell within reach at all times, provide assistance with transfer out of bed and ambulation.  Medication Inspection Compliance: Pill count conducted under aseptic conditions, in front of the patient. Neither the pills nor the bottle was removed from the patient's sight at any time. Once count was completed pills were immediately returned to the patient in their original bottle.  Medication: Oxycodone IR Pill/Patch Count: 23 of 90 pills remain Pill/Patch Appearance: Markings consistent with prescribed medication Bottle Appearance: Standard pharmacy container. Clearly labeled. Filled Date: 07 / 02 / 2021 Last Medication intake:  Today

## 2020-02-11 NOTE — Patient Instructions (Signed)
____________________________________________________________________________________________  Drug Holidays (Slow)  What is a "Drug Holiday"? Drug Holiday: is the name given to the period of time during which a patient stops taking a medication(s) for the purpose of eliminating tolerance to the drug.  Benefits . Improved effectiveness of opioids. . Decreased opioid dose needed to achieve benefits. . Improved pain with lesser dose.  What is tolerance? Tolerance: is the progressive decreased in effectiveness of a drug due to its repetitive use. With repetitive use, the body gets use to the medication and as a consequence, it loses its effectiveness. This is a common problem seen with opioid pain medications. As a result, a larger dose of the drug is needed to achieve the same effect that used to be obtained with a smaller dose.  How long should a "Drug Holiday" last? You should stay off of the pain medicine for at least 14 consecutive days. (2 weeks)  Should I stop the medicine "cold turkey"? No. You should always coordinate with your Pain Specialist so that he/she can provide you with the correct medication dose to make the transition as smoothly as possible.  How do I stop the medicine? Slowly. You will be instructed to decrease the daily amount of pills that you take by one (1) pill every seven (7) days. This is called a "slow downward taper" of your dose. For example: if you normally take four (4) pills per day, you will be asked to drop this dose to three (3) pills per day for seven (7) days, then to two (2) pills per day for seven (7) days, then to one (1) per day for seven (7) days, and at the end of those last seven (7) days, this is when the "Drug Holiday" would start.   Will I have withdrawals? By doing a "slow downward taper" like this one, it is unlikely that you will experience any significant withdrawal symptoms. Typically, what triggers withdrawals is the sudden stop of a high  dose opioid therapy. Withdrawals can usually be avoided by slowly decreasing the dose over a prolonged period of time. If you do not follow these instructions and decide to stop your medication abruptly, withdrawals may be possible.  What are withdrawals? Withdrawals: refers to the wide range of symptoms that occur after stopping or dramatically reducing opiate drugs after heavy and prolonged use. Withdrawal symptoms do not occur to patients that use low dose opioids, or those who take the medication sporadically. Contrary to benzodiazepine (example: Valium, Xanax, etc.) or alcohol withdrawals ("Delirium Tremens"), opioid withdrawals are not lethal. Withdrawals are the physical manifestation of the body getting rid of the excess receptors.  Expected Symptoms Early symptoms of withdrawal may include: . Agitation . Anxiety . Muscle aches . Increased tearing . Insomnia . Runny nose . Sweating . Yawning  Late symptoms of withdrawal may include: . Abdominal cramping . Diarrhea . Dilated pupils . Goose bumps . Nausea . Vomiting  Will I experience withdrawals? Due to the slow nature of the taper, it is very unlikely that you will experience any.  What is a slow taper? Taper: refers to the gradual decrease in dose.  (Last update: 02/06/2020) ____________________________________________________________________________________________    ____________________________________________________________________________________________  Medication Rules  Purpose: To inform patients, and their family members, of our rules and regulations.  Applies to: All patients receiving prescriptions (written or electronic).  Pharmacy of record: Pharmacy where electronic prescriptions will be sent. If written prescriptions are taken to a different pharmacy, please inform the nursing staff. The pharmacy   listed in the electronic medical record should be the one where you would like electronic prescriptions  to be sent.  Electronic prescriptions: In compliance with the [place] Strengthen Opioid Misuse Prevention (STOP) Act of 2017 (Session Law 2017-74/H243), effective July 19, 2018, all controlled substances must be electronically prescribed. Calling prescriptions to the pharmacy will cease to exist.  Prescription refills: Only during scheduled appointments. Applies to all prescriptions.  NOTE: The following applies primarily to controlled substances (Opioid* Pain Medications).   Type of encounter (visit): For patients receiving controlled substances, face-to-face visits are required. (Not an option or up to the patient.)  Patient's responsibilities: 1. Pain Pills: Bring all pain pills to every appointment (except for procedure appointments). 2. Pill Bottles: Bring pills in original pharmacy bottle. Always bring the newest bottle. Bring bottle, even if empty. 3. Medication refills: You are responsible for knowing and keeping track of what medications you take and those you need refilled. The day before your appointment: write a list of all prescriptions that need to be refilled. The day of the appointment: give the list to the admitting nurse. Prescriptions will be written only during appointments. No prescriptions will be written on procedure days. If you forget a medication: it will not be "Called in", "Faxed", or "electronically sent". You will need to get another appointment to get these prescribed. No early refills. Do not call asking to have your prescription filled early. 4. Prescription Accuracy: You are responsible for carefully inspecting your prescriptions before leaving our office. Have the discharge nurse carefully go over each prescription with you, before taking them home. Make sure that your name is accurately spelled, that your address is correct. Check the name and dose of your medication to make sure it is accurate. Check the number of pills, and the written instructions to  make sure they are clear and accurate. Make sure that you are given enough medication to last until your next medication refill appointment. 5. Taking Medication: Take medication as prescribed. When it comes to controlled substances, taking less pills or less frequently than prescribed is permitted and encouraged. Never take more pills than instructed. Never take medication more frequently than prescribed.  6. Inform other Doctors: Always inform, all of your healthcare providers, of all the medications you take. 7. Pain Medication from other Providers: You are not allowed to accept any additional pain medication from any other Doctor or Healthcare provider. There are two exceptions to this rule. (see below) In the event that you require additional pain medication, you are responsible for notifying us, as stated below. 8. Medication Agreement: You are responsible for carefully reading and following our Medication Agreement. This must be signed before receiving any prescriptions from our practice. Safely store a copy of your signed Agreement. Violations to the Agreement will result in no further prescriptions. (Additional copies of our Medication Agreement are available upon request.) 9. Laws, Rules, & Regulations: All patients are expected to follow all Federal and State Laws, Statutes, Rules, & Regulations. Ignorance of the Laws does not constitute a valid excuse.  10. Illegal drugs and Controlled Substances: The use of illegal substances (including, but not limited to marijuana and its derivatives) and/or the illegal use of any controlled substances is strictly prohibited. Violation of this rule may result in the immediate and permanent discontinuation of any and all prescriptions being written by our practice. The use of any illegal substances is prohibited. 11. Adopted CDC guidelines & recommendations: Target dosing levels will be at or   below 60 MME/day. Use of benzodiazepines** is not  recommended.  Exceptions: There are only two exceptions to the rule of not receiving pain medications from other Healthcare Providers. 1. Exception #1 (Emergencies): In the event of an emergency (i.e.: accident requiring emergency care), you are allowed to receive additional pain medication. However, you are responsible for: As soon as you are able, call our office [phone], at any time of the day or night, and leave a message stating your name, the date and nature of the emergency, and the name and dose of the medication prescribed. In the event that your call is answered by a member of our staff, make sure to document and save the date, time, and the name of the person that took your information.  2. Exception #2 (Planned Surgery): In the event that you are scheduled by another doctor or dentist to have any type of surgery or procedure, you are allowed (for a period no longer than 30 days), to receive additional pain medication, for the acute post-op pain. However, in this case, you are responsible for picking up a copy of our "Post-op Pain Management for Surgeons" handout, and giving it to your surgeon or dentist. This document is available at our office, and does not require an appointment to obtain it. Simply go to our office during business hours (Monday-Thursday from 8:00 AM to 4:00 PM) (Friday 8:00 AM to 12:00 Noon) or if you have a scheduled appointment with us, prior to your surgery, and ask for it by name. In addition, you will need to provide us with your name, name of your surgeon, type of surgery, and date of procedure or surgery.  *Opioid medications include: morphine, codeine, oxycodone, oxymorphone, hydrocodone, hydromorphone, meperidine, tramadol, tapentadol, buprenorphine, fentanyl, methadone. **Benzodiazepine medications include: diazepam (Valium), alprazolam (Xanax), clonazepam (Klonopine), lorazepam (Ativan), clorazepate (Tranxene), chlordiazepoxide (Librium), estazolam (Prosom),  oxazepam (Serax), temazepam (Restoril), triazolam (Halcion) (Last updated: 09/15/2017) ____________________________________________________________________________________________   ____________________________________________________________________________________________  Medication Recommendations and Reminders  Applies to: All patients receiving prescriptions (written and/or electronic).  Medication Rules & Regulations: These rules and regulations exist for your safety and that of others. They are not flexible and neither are we. Dismissing or ignoring them will be considered "non-compliance" with medication therapy, resulting in complete and irreversible termination of such therapy. (See document titled "Medication Rules" for more details.) In all conscience, because of safety reasons, we cannot continue providing a therapy where the patient does not follow instructions.  Pharmacy of record:   Definition: This is the pharmacy where your electronic prescriptions will be sent.   We do not endorse any particular pharmacy, however, we have experienced problems with [facility] not securing enough medication supply for the community.  We do not restrict you in your choice of pharmacy. However, once we write for your prescriptions, we will NOT be re-sending more prescriptions to fix restricted supply problems created by your pharmacy, or your insurance.   The pharmacy listed in the electronic medical record should be the one where you want electronic prescriptions to be sent.  If you choose to change pharmacy, simply notify our nursing staff.  Recommendations:  Keep all of your pain medications in a safe place, under lock and key, even if you live alone. We will NOT replace lost, stolen, or damaged medication.  After you fill your prescription, take 1 week's worth of pills and put them away in a safe place. You should keep a separate, properly labeled bottle for this purpose. The remainder    should be kept in the original bottle. Use this as your primary supply, until it runs out. Once it's gone, then you know that you have 1 week's worth of medicine, and it is time to come in for a prescription refill. If you do this correctly, it is unlikely that you will ever run out of medicine.  To make sure that the above recommendation works, it is very important that you make sure your medication refill appointments are scheduled at least 1 week before you run out of medicine. To do this in an effective manner, make sure that you do not leave the office without scheduling your next medication management appointment. Always ask the nursing staff to show you in your prescription , when your medication will be running out. Then arrange for the receptionist to get you a return appointment, at least 7 days before you run out of medicine. Do not wait until you have 1 or 2 pills left, to come in. This is very poor planning and does not take into consideration that we may need to cancel appointments due to bad weather, sickness, or emergencies affecting our staff.  DO NOT ACCEPT A "Partial Fill": If for any reason your pharmacy does not have enough pills/tablets to completely fill or refill your prescription, do not allow for a "partial fill". The law allows the pharmacy to complete that prescription within 72 hours, without requiring a new prescription. If they do not fill the rest of your prescription within those 72 hours, you will need a separate prescription to fill the remaining amount, which we will NOT provide. If the reason for the partial fill is your insurance, you will need to talk to the pharmacist about payment alternatives for the remaining tablets, but again, DO NOT ACCEPT A PARTIAL FILL, unless you can trust your pharmacist to obtain the remainder of the pills within 72 hours.  Prescription refills and/or changes in medication(s):   Prescription refills, and/or changes in dose or medication,  will be conducted only during scheduled medication management appointments. (Applies to both, written and electronic prescriptions.)  No refills on procedure days. No medication will be changed or started on procedure days. No changes, adjustments, and/or refills will be conducted on a procedure day. Doing so will interfere with the diagnostic portion of the procedure.  No phone refills. No medications will be "called into the pharmacy".  No Fax refills.  No weekend refills.  No Holliday refills.  No after hours refills.  Remember:  Business hours are:  Monday to Thursday 8:00 AM to 4:00 PM Provider's Schedule: Dyland Panuco, MD - Appointments are:  Medication management: Monday and Wednesday 8:00 AM to 4:00 PM Procedure day: Tuesday and Thursday 7:30 AM to 4:00 PM Bilal Lateef, MD - Appointments are:  Medication management: Tuesday and Thursday 8:00 AM to 4:00 PM Procedure day: Monday and Wednesday 7:30 AM to 4:00 PM (Last update: 02/06/2020) ____________________________________________________________________________________________   ____________________________________________________________________________________________  CANNABIDIOL (AKA: CBD Oil or Pills)  Applies to: All patients receiving prescriptions of controlled substances (written and/or electronic).  General Information: Cannabidiol (CBD), a derivative of Marijuana, was discovered in 1940. It is one of some 113 identified cannabinoids in cannabis (Marijuana) plants, accounting for up to 40% of the plant's extract. As of 2018, preliminary clinical research on cannabidiol included studies of anxiety, cognition, movement disorders, and pain.  Cannabidiol is consummed in multiple ways, including inhalation of cannabis smoke or vapor, as an aerosol spray into the cheek, and by mouth. It   may be supplied as CBD oil containing CBD as the active ingredient (no added tetrahydrocannabinol (THC) or terpenes), a full-plant  CBD-dominant hemp extract oil, capsules, dried cannabis, or as a liquid solution. CBD is thought not have the same psychoactivity as THC, and may affect the actions of THC. Studies suggest that CBD may interact with different biological targets, including cannabinoid receptors and other neurotransmitter receptors. As of 2018 the mechanism of action for its biological effects has not been determined.  In the [place], cannabidiol has a limited approval by the Food and Drug Administration (FDA) for treatment of only two types of epilepsy disorders. The side effects of long-term use of the drug include somnolence, decreased appetite, diarrhea, fatigue, malaise, weakness, sleeping problems, and others.  CBD remains a Schedule I drug prohibited for any use.  Legality: Some manufacturers ship CBD products nationally, an illegal action which the FDA has not enforced in 2018, with CBD remaining the subject of an FDA investigational new drug evaluation, and is not considered legal as a dietary supplement or food ingredient as of December 2018. Federal illegality has made it difficult historically to conduct research on CBD. CBD is openly sold in head shops and health food stores in some states where such sales have not been explicitly legalized.  Warning: Because it is not FDA approved for general use or treatment of pain, it is not required to undergo the same manufacturing controls as prescription drugs.  This means that the available cannabidiol (CBD) may be contaminated with THC.  If this is the case, it will trigger a positive urine drug screen (UDS) test for cannabinoids (Marijuana).  Because a positive UDS for illicit substances is a violation of our medication agreement, your opioid analgesics (pain medicine) may be permanently discontinued. (Last update: 02/06/2020) ____________________________________________________________________________________________    

## 2020-02-12 ENCOUNTER — Ambulatory Visit: Payer: Medicaid Other | Admitting: Pain Medicine

## 2020-02-15 LAB — TOXASSURE SELECT 13 (MW), URINE

## 2020-02-21 ENCOUNTER — Telehealth: Payer: Self-pay | Admitting: Pain Medicine

## 2020-02-21 NOTE — Telephone Encounter (Signed)
[facility] called with new Insurance Info  [place] [employer] [place]  Member [place] #[id number]  Group [id number] She said we needed for PA

## 2020-02-21 NOTE — Telephone Encounter (Signed)
She was calling to see what the status is on her prior auth for oxycodone?

## 2020-02-21 NOTE — Telephone Encounter (Signed)
I did PA for Oxycodone on 02-20-20. No determination has been make.  I called patient to tell her this. She informed me that she has changed insurance, and now has [employer], but she has not yet received her new card. I attempted to submit a new PA with [employer], with the same ID number. It was not accepted. Patient said she will call Medicaid and see if they will give her the ID number.

## 2020-02-24 NOTE — Progress Notes (Deleted)
Office Visit    Patient Name: Erica [name] Date of Encounter: 02/24/2020  Primary Care Provider:  Denton Lank, MD Primary Cardiologist:  Ida Rogue, MD  Chief Complaint    No chief complaint on file.  54 y.o. female with a hx of HFpEF (EF 55-65%, 10/2017), asthma, COPD, DM2, hypertension, hyperlipidemia, h/o current alcohol use and current tobacco abuse (1 pk daily), h/o recent pancreatitis and electrolyte abnormalities 07/2018, OA on pain management, and OSA on patient reported home O2, and here today fr follow-up.   Past Medical History    Past Medical History:  Diagnosis Date  . (HFpEF) heart failure with preserved ejection fraction ([place])    a. 05/2015 Echo: EF 60-65%, no rwma, PASP 15mHg.  .Marland KitchenAcute pancreatitis 08/13/2018  . Arthritis   . Asthma   . Bell's palsy   . Bronchitis   . CHF (congestive heart failure) ([place]   . COPD (chronic obstructive pulmonary disease) ([place]   . Diabetes [place] complication [place]    type II 03/2017  . Gastric ulcer   . Hyperlipidemia   . Hypertension   . OSA (obstructive sleep apnea)    a. did not tolerate CPAP.  .Marland KitchenPancreatitis    07/2018  . Shoulder injury    6/19   Past Surgical History:  Procedure Laterality Date  . FLEXIBLE BRONCHOSCOPY N/A 06/20/2015   Procedure: FLEXIBLE BRONCHOSCOPY;  Surgeon: PLaverle Hobby MD;  Location: [facility] ORS;  Service: Pulmonary;  Laterality: N/A;  . KNEE SURGERY Left    8 knee surgeries    Allergies  Allergies  Allergen Reactions  . Tramadol Other (See Comments) and Palpitations    Heart Skipping Beats Heart palpatations  . Penicillins Rash    Has patient had a PCN reaction causing immediate rash, facial/tongue/throat swelling, SOB or lightheadedness with hypotension: Yes Has patient had a PCN reaction causing severe rash involving mucus membranes or skin necrosis: No Has patient had a PCN reaction that required hospitalization: No Has patient had a PCN reaction occurring  within the last 10 years: No If all of the above answers are "NO", then may proceed with Cephalosporin use.    History of Present Illness    Erica GRUENis a 54y.o. female with PMH as above.   [facility]Startwas [facility] and acute pancreatitis.    [facility]  It was noted she had been prescribed losartan 100 mg daily but felt that it may have lowered her blood pressure too much and therefore was only taking half tablet daily at that time.  She reported home blood pressures in the mornings with systolic blood pressure in the 130s but evening pressures in the 170s to 180s.  She noted that chest discomfort typically occurred with elevated SBP.  She noted some degree of chronic DOE and admitted to being fairly sedentary.  Given her risk factors for cardiac disease, stress test was ordered and completed 10/21/2017 and ruled low risk study.  Left ventricular ejection fraction was estimated at 55 to 65% at that time.  She was admitted 08/2018 with atypical CP, thought to be non-cardiac and likely GI in nature.   She was seen via virtual visit 10/2018 and was thought to be euvolemic. She denied significant palpitations.    Home Medications    Prior to Admission medications   Medication Sig Start Date End Date Taking? Authorizing Provider  albuterol (PROAIR HFA) 108 (  90 Base) MCG/ACT inhaler Inhale 2 puffs into the lungs every 6 (six) hours as needed for wheezing or shortness of breath.     [provider]  aspirin EC 81 MG EC tablet Take 1 tablet (81 mg total) by mouth daily. 06/23/15   Demetrios Loll, MD  atorvastatin (LIPITOR) 80 MG tablet Take 1 tablet (80 mg total) by mouth daily. 10/31/18   Erica Merritts, MD  Blood Glucose Monitoring Suppl (ACCU-CHEK AVIVA PLUS) w/Device KIT See admin instructions. 10/20/17   [provider]  busPIRone (BUSPAR) 5 MG tablet Take 5 mg by mouth 2 (two) times daily.     [provider]  Cholecalciferol (VITAMIN D3) 50 MCG (2000 UT) capsule Take 1 capsule (2,000 Units total) by mouth daily. Take 1 capsule (2,000 Units total) by mouth daily. 05/23/19 05/22/20  Milinda Pointer, MD  diltiazem (CARDIZEM CD) 120 MG 24 hr capsule TAKE 1 CAPSULE(120 MG) BY MOUTH DAILY 12/31/19   Erica Merritts, MD  diltiazem (CARDIZEM) 30 MG tablet TAKE 1 TABLET(30 MG) BY MOUTH THREE TIMES DAILY AS NEEDED 12/31/19   Erica Merritts, MD  DULoxetine (CYMBALTA) 30 MG capsule Take 1 capsule (30 mg total) by mouth daily. Patient taking differently: Take 30 mg by mouth daily. Takes with 45m for 980m10/17/19   Erica HamsMD  DULoxetine (CYMBALTA) 60 MG capsule Take 60 mg by mouth daily.     [provider]  Fluticasone-Salmeterol (ADVAIR) 250-50 MCG/DOSE AEPB Inhale 1 puff into the lungs 2 (two) times daily.    [provider]  furosemide (LASIX) 40 MG tablet Take 1 tablet (40 mg total) by mouth daily. Take an additional 40 mg in the am for weight gain as needed 10/31/18   GoMinna MerrittsMD  Magnesium 500 MG CAPS once daily. 11/08/16   [provider]  metFORMIN (GLUCOPHAGE) 500 MG tablet Take 1 tablet (500 mg total) by mouth 2 (two) times daily with a meal. 05/04/18   Erica HamsMD  oxyCODONE (OXY IR/ROXICODONE) 5 MG immediate release tablet Take 1 tablet (5 mg total) by mouth every 8 (eight) hours as needed for severe pain. Must last 30 days 02/17/20 03/18/20  Erica PointerMD  OXYGEN Inhale 2 L into the lungs at bedtime.     [provider]  pantoprazole (PROTONIX) 40 MG tablet Take 1 tablet (40 mg total) by mouth daily. 05/04/18   Erica HamsMD  pregabalin (LYRICA) 150 MG capsule Take 1 capsule (150 mg total) by mouth 3 (three) times daily. 02/11/20 08/09/20  Erica PointerMD  sucralfate (CARAFATE) 1 G tablet Take 1 tablet (1 g total) by mouth 4 (four) times daily -  with meals and at bedtime. Patient taking  differently: Take 1 g by mouth 2 (two) times daily.  06/23/15   Erica LollMD  tiotropium (SPIRIVA HANDIHALER) 18 MCG inhalation capsule Place 18 mcg into inhaler and inhale daily.    [provider]  tiZANidine (ZANAFLEX) 4 MG capsule Take 1 capsule (4 mg total) by mouth 3 (three) times daily as needed for muscle spasms. 02/16/20 08/14/20  Erica PointerMD    Review of Systems    ***.   All other systems reviewed and are otherwise negative except as noted above.  Physical Exam    VS:  LMP 03/13/2015 (Approximate) Comment: more than 2 years ago , BMI There is no height or weight on file to calculate BMI. GEN: Well nourished, well  developed, in no acute distress. HEENT: normal. Neck: Supple, no JVD, carotid bruits, or masses. Cardiac: RRR, no murmurs, rubs, or gallops. No clubbing, cyanosis, edema.  Radials/DP/PT 2+ and equal bilaterally.  Respiratory:  Respirations regular and unlabored, clear to auscultation bilaterally. GI: Soft, nontender, nondistended, BS + x 4. MS: no deformity or atrophy. Skin: warm and dry, no rash. Neuro:  Strength and sensation are intact. Psych: Normal affect.  Accessory Clinical Findings    ECG personally reviewed by me today - *** - no acute changes.  VITALS Reviewed today   Temp Readings from Last 3 Encounters:  02/11/20 (!) 97.3 F (36.3 C) (Temporal)  06/28/19 (!) 96.4 F (35.8 C)  10/11/18 98.4 F (36.9 C) (Oral)   BP Readings from Last 3 Encounters:  02/11/20 (!) 145/84  06/28/19 (!) 122/59  10/11/18 139/60   Pulse Readings from Last 3 Encounters:  02/11/20 89  06/28/19 94  10/11/18 95    Wt Readings from Last 3 Encounters:  02/11/20 198 lb (89.8 kg)  06/28/19 198 lb (89.8 kg)  10/11/18 200 lb (90.7 kg)     LABS  reviewed today   Lab Results  Component Value Date   WBC 7.7 09/11/2018   HGB 10.2 (L) 09/11/2018   HCT 32.2 (L) 09/11/2018   MCV 94.7 09/11/2018   PLT 167 09/11/2018   Lab Results  Component Value  Date   CREATININE 0.56 09/12/2018   BUN 7 09/12/2018   NA 138 09/12/2018   K 3.8 09/12/2018   CL 104 09/12/2018   CO2 30 09/12/2018   Lab Results  Component Value Date   ALT 32 09/10/2018   AST 35 09/10/2018   ALKPHOS 121 09/10/2018   BILITOT 0.8 09/10/2018   Lab Results  Component Value Date   CHOL 145 09/11/2018   HDL 42 09/11/2018   LDLCALC 70 09/11/2018   TRIG 163 (H) 09/11/2018   CHOLHDL 3.5 09/11/2018    Lab Results  Component Value Date   HGBA1C 6.0 (H) 09/11/2018   Lab Results  Component Value Date   TSH 8.074 (H) 06/19/2015     STUDIES/PROCEDURES reviewed today   ***   1. The left ventricle has normal systolic function, with an ejection  fraction of 55-60%. The cavity size was normal. There is mild concentric  left ventricular hypertrophy. Left ventricular diastolic Doppler  parameters are consistent with impaired  relaxation.  2. The right ventricle has normal systolic function. The cavity was  normal. There is no increase in right ventricular wall thickness.  3. The mitral valve is normal in structure.  4. The tricuspid valve is normal in structure.  5. The aortic valve is tricuspid.  6. The pulmonic valve was normal in structure.    Assessment & Plan    ***  Medication changes: *** Labs ordered: *** Studies / Imaging ordered: *** Future considerations: *** Disposition: ***  Total time spent with patient today *** minutes. This includes reviewing records, evaluating the patient, and coordinating care. Face-to-face time >50%.    Arvil Chaco, PA-C 02/24/2020

## 2020-02-25 ENCOUNTER — Ambulatory Visit: Payer: Medicaid Other | Admitting: Physician Assistant

## 2020-02-26 ENCOUNTER — Encounter: Payer: Self-pay | Admitting: Physician Assistant

## 2020-02-26 ENCOUNTER — Ambulatory Visit: Payer: Medicaid Other | Attending: Pain Medicine | Admitting: Pain Medicine

## 2020-02-26 NOTE — Progress Notes (Deleted)
Canceled by patient

## 2020-02-27 ENCOUNTER — Telehealth: Payer: Self-pay | Admitting: Pain Medicine

## 2020-02-27 NOTE — Telephone Encounter (Signed)
Patient notified Oxycodone has been denied by insurance. I suggested she call her insurance and ask what is covered, we can schedule an appointment so she can talk with Dr. Laban Emperor about this.

## 2020-02-27 NOTE — Telephone Encounter (Signed)
Pt called stating that her pain medication needs PA. She states that she called back last week and gave insurance information since she has a new insurance. Pt would like to know if PA has been done.

## 2020-03-10 ENCOUNTER — Encounter: Payer: Medicaid Other | Admitting: Pain Medicine

## 2020-03-26 ENCOUNTER — Other Ambulatory Visit: Payer: Self-pay

## 2020-03-26 ENCOUNTER — Encounter: Payer: Self-pay | Admitting: Pain Medicine

## 2020-03-26 ENCOUNTER — Ambulatory Visit: Payer: Medicaid Other | Attending: Pain Medicine | Admitting: Pain Medicine

## 2020-03-26 VITALS — BP 142/75 | HR 101 | Temp 96.7°F | Resp 18 | Ht 68.0 in | Wt 200.0 lb

## 2020-03-26 DIAGNOSIS — R252 Cramp and spasm: Secondary | ICD-10-CM | POA: Diagnosis present

## 2020-03-26 DIAGNOSIS — M25551 Pain in right hip: Secondary | ICD-10-CM | POA: Insufficient documentation

## 2020-03-26 DIAGNOSIS — F1120 Opioid dependence, uncomplicated: Secondary | ICD-10-CM | POA: Diagnosis present

## 2020-03-26 DIAGNOSIS — G894 Chronic pain syndrome: Secondary | ICD-10-CM | POA: Insufficient documentation

## 2020-03-26 DIAGNOSIS — Z79899 Other long term (current) drug therapy: Secondary | ICD-10-CM | POA: Diagnosis present

## 2020-03-26 DIAGNOSIS — M62830 Muscle spasm of back: Secondary | ICD-10-CM

## 2020-03-26 DIAGNOSIS — G8929 Other chronic pain: Secondary | ICD-10-CM | POA: Diagnosis present

## 2020-03-26 DIAGNOSIS — M25561 Pain in right knee: Secondary | ICD-10-CM

## 2020-03-26 DIAGNOSIS — M545 Low back pain: Secondary | ICD-10-CM | POA: Diagnosis present

## 2020-03-26 DIAGNOSIS — M25552 Pain in left hip: Secondary | ICD-10-CM | POA: Diagnosis present

## 2020-03-26 DIAGNOSIS — M25562 Pain in left knee: Secondary | ICD-10-CM | POA: Diagnosis present

## 2020-03-26 MED ORDER — MORPHINE SULFATE ER 15 MG PO TBCR: 15.0000 mg | EXTENDED_RELEASE_TABLET | Freq: Two times a day (BID) | ORAL | 0 refills | Status: DC

## 2020-03-26 NOTE — Progress Notes (Signed)
PROVIDER NOTE: Information contained herein reflects review and annotations entered in association with encounter. Interpretation of such information and data should be left to medically-trained personnel. Information provided to patient can be located elsewhere in the medical record under "Patient Instructions". Document created using STT-dictation technology, any transcriptional errors that may result from process are unintentional.    Patient: Erica [name]  Service Category: E/M  Provider: Gaspar Cola, MD  DOB: May 21, 1966  DOS: 03/26/2020  Specialty: Interventional Pain Management  MRN: [id number]  Setting: Ambulatory outpatient  PCP: Denton Lank, MD  Type: Established Patient    Referring Provider: Denton Lank, MD  Location: Office  Delivery: Face-to-face     HPI  Reason for encounter: [name], a 54 y.o. year old female, is here today for evaluation and management of her Chronic pain syndrome [G89.4]. [name] primary complain today is Back Pain Last encounter: Practice (02/27/2020). My last encounter with her was on 02/27/2020. Pertinent problems: [name] has Chronic low back pain (Primary Area of Pain) (Bilateral) (L>R); Chronic knee pain (Third area of Pain) (Bilateral) (L>R); Chronic neck pain (Bilateral) (R>L); Chronic upper back pain (midline); Chronic foot pain (bottom of feet) (Bilateral) (R>L); Chronic hand pain (Bilateral); Peripheral neuropathy, idiopathic (upper and lower extremity); Chronic sacroiliac joint pain (Bilateral) (R>L); Lumbar facet syndrome (Bilateral) (R>L); Chronic pain syndrome; Neurogenic pain; Lumbar spondylosis; Osteoarthritis of knee (Bilateral) (L>R); Intermittent left thoracic Muscle cramps; Spasm of thoracolumbar muscle (Left); Post-traumatic osteoarthritis of knee (Left); Hemarthrosis, left knee; 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 Pain Assessment: Severity of Chronic pain is reported as a 5 /10. Location: Back Left/The left leg... Onset: More than a month ago. Quality: Numbness, Constant, Squeezing, Aching. Timing: Constant. Modifying factor(s): medications.. Vitals:  height is 5' 8"  (1.727 m) and weight is 200 lb (90.7 kg). Her temporal temperature is 96.7 F (35.9 C) (abnormal). Her blood pressure is 142/75 (abnormal) and her pulse is 101 (abnormal). Her respiration is 18 and oxygen saturation is 98%.   Today the patient comes to the clinic for follow-up evaluation and medication management.  She has been taking oxycodone IR 5 mg 1 tablet p.o. every 8 hours (15 mg/day of oxycodone) (22.5 MME's).  Today the patient has been made aware of the oxycodone national shortage and we have discussed some alternatives.  She has no documented evidence of any allergies to morphine and today she is actually requesting that we go up on her pain medicine since she is experiencing a little bit more pain than usual.  She refers that she this has been like this for a while now.  Today I have provided her with some alternatives to treat this pain including switching her to the hydrocodone/APAP 7.5/325 1 tablet p.o. 3 times daily (22.5 MME's) or to put her on morphine ER (MS Contin) 15 mg p.o. twice daily (30 MME).  I have explained the concept of MME's to the patient and she has decided to go with the morphine since at the end of the day he will provide her with an increase in her dose compared to the hydrocodone.  In addition to this, we will be transferring the  nonopioids to her PCP today.  Transfer: Tizanidine (Zanaflex) 4 mg capsule, 1 capsule p.o. 3 times daily (90/month) (08/14/2020); pregabalin (Lyrica) 150 mg capsule, 1 capsule p.o. 3 times daily (90/month) (08/09/2020); cholecalciferol (vitamin D3) 50 mcg (2000 IU) capsule, 1 capsule p.o. daily with breakfast (30/month) (05/22/2020).  Pharmacotherapy Assessment   Analgesic: Oxycodone IR 5 mg, 1 tab PO q 8 hrs (64m/dayof oxycodone) MME/day:22.586mday.   Monitoring: [place] PMP: PDMP reviewed during this encounter.       Pharmacotherapy: No side-effects or adverse reactions reported. Compliance: No problems identified. Effectiveness: Clinically acceptable.  TiDewayne ShorterRN  03/26/2020  2:50 PM  Signed Nursing Pain Medication Assessment:  Safety precautions to be maintained throughout the outpatient stay will include: orient to surroundings, keep bed in low position, maintain call bell within reach at all times, provide assistance with transfer out of bed and ambulation.  Medication Inspection Compliance: Pill count conducted under aseptic conditions, in front of the patient. Neither the pills nor the bottle was removed from the patient's sight at any time. Once count was completed pills were immediately returned to the patient in their original bottle.  Medication: Oxycodone IR Pill/Patch Count: 10 of 90 pills remain Pill/Patch Appearance: Markings consistent with prescribed medication Bottle Appearance: Standard pharmacy container. Clearly labeled. Filled Date: 08 / 11 / 2021 Last Medication intake:  Today    UDS:  Summary  Date Value Ref Range Status  02/12/2020 Note  Final    Comment:    ==================================================================== [employer] [facility] (MW) ==================================================================== Test                             Result       Flag       Units  Drug Present   Alprazolam                     168                      ng/mg creat   Alpha-hydroxyalprazolam        155                     ng/mg creat    Source of alprazolam is a scheduled prescription medication. Alpha-    hydroxyalprazolam is an expected metabolite of alprazolam.    Oxycodone                      2488                    ng/mg creat   Oxymorphone                    1555                    ng/mg creat   Noroxycodone                   992                     ng/mg creat   Noroxymorphone                 236                     ng/mg creat  Sources of oxycodone are scheduled prescription medications.    Oxymorphone, noroxycodone, and noroxymorphone are expected    metabolites of oxycodone. Oxymorphone is also available as a    scheduled prescription medication.  ==================================================================== Test                      Result    Flag   Units      Ref Range   Creatinine              242              mg/dL      >=20 ==================================================================== Declared Medications:  Medication list was not provided. ==================================================================== For clinical consultation, please call [phone]. ====================================================================      ROS  Constitutional: Denies any fever or chills Gastrointestinal: No reported hemesis, hematochezia, vomiting, or acute GI distress Musculoskeletal: Denies any acute onset joint swelling, redness, loss of ROM, or weakness Neurological: No reported episodes of acute onset apraxia, aphasia, dysarthria, agnosia, amnesia, paralysis, loss of coordination, or loss of consciousness  Medication Review  Accu-Chek Aviva Plus, DULoxetine, Fluticasone-Salmeterol, Magnesium, Oxygen-Helium, Vitamin D3, albuterol, aspirin, atorvastatin, busPIRone, clonazePAM, diltiazem, furosemide, metFORMIN, morphine, pantoprazole, potassium chloride SA, pregabalin, spironolactone, sucralfate,  tiZANidine, tiotropium, and varenicline  History Review  Allergy: [name] is allergic to tramadol and penicillins. Drug: [name]  reports no history of drug use. Alcohol:  reports no history of alcohol use. Tobacco:  reports that she quit smoking about 4 years ago. Her smoking use included cigarettes. She has a 36.00 pack-year smoking history. She has never used smokeless tobacco. Social: [name]  reports that she quit smoking about 4 years ago. Her smoking use included cigarettes. She has a 36.00 pack-year smoking history. She has never used smokeless tobacco. She reports that she does not drink alcohol and does not use drugs. Medical:  has a past medical history of (HFpEF) heart failure with preserved ejection fraction ([place]), Acute pancreatitis (08/13/2018), Arthritis, Asthma, Bell's palsy, Bronchitis, CHF (congestive heart failure) ([place]), COPD (chronic obstructive pulmonary disease) ([place]), Diabetes mellitus without complication ([place]), Gastric ulcer, Hyperlipidemia, Hypertension, OSA (obstructive sleep apnea), Pancreatitis, and Shoulder injury. Surgical: [name]  has a past surgical history that includes Knee surgery (Left) and Flexible bronchoscopy (N/A, 06/20/2015). Family: family history includes Breast cancer in her mother; Diabetes in her maternal grandmother; Lung cancer in her father.  Laboratory Chemistry Profile   Renal Lab Results  Component Value Date   BUN 7 09/12/2018   CREATININE 0.56 09/12/2018   LABCREA 138 06/14/2015   BCR 15 04/22/2016   GFRAA >60 09/12/2018   GFRNONAA >60 09/12/2018     Hepatic Lab Results  Component Value Date   AST 35 09/10/2018   ALT 32 09/10/2018   ALBUMIN 3.5 09/10/2018   ALKPHOS 121 09/10/2018   HCVAB <0.1 08/12/2018   LIPASE 38 09/10/2018     Electrolytes Lab Results  Component Value Date   NA 138 09/12/2018   K 3.8 09/12/2018   CL 104 09/12/2018   CALCIUM 8.6 (L) 09/12/2018   MG 2.0 09/12/2018   PHOS 1.1 (L) 09/12/2018      Bone Lab Results  Component Value Date   25OHVITD1 15 (L) 04/30/2016   25OHVITD2 <1.0 04/30/2016   25OHVITD3 15 04/30/2016     Inflammation (CRP: Acute Phase) (ESR: Chronic Phase) Lab Results  Component Value Date   CRP 1.3 (H) 04/30/2016   ESRSEDRATE 31 (H) 04/30/2016  Note: Above Lab results reviewed.  Recent Imaging Review  DG Knee 1-2 Views Left CLINICAL DATA:  Left knee pain after remote trauma  EXAM: LEFT KNEE - 1-2 VIEW  COMPARISON:  12/10/2016  FINDINGS: Again noted are 2 intact threaded screws within the tibial tuberosity. No perihardware lucency or fracture. Mild tricompartmental joint space narrowing. Small to moderate left knee joint effusion. No focal soft tissue swelling.  IMPRESSION: 1. Mild tricompartmental osteoarthritis of the left knee with small to moderate joint effusion. 2. Intact hardware at the tibial tuberosity.  Electronically Signed   By: Davina Poke D.O.   On: 02/11/2020 15:59 DG HIP UNILAT W OR W/O PELVIS 2-3 VIEWS LEFT CLINICAL DATA:  Chronic bilateral hip pain  EXAM: DG HIP (WITH OR WITHOUT PELVIS) 2-3V RIGHT; DG HIP (WITH OR WITHOUT PELVIS) 2-3V LEFT  COMPARISON:  04/30/2016  FINDINGS: No evidence of acute fracture or dislocation. There is moderate joint space narrowing of the right hip and mild joint space narrowing of the left hip with minimal interval progression from prior. Mild arthropathy of the bilateral SI joints. Nonspecific benign-appearing soft tissue calcification within the lateral soft tissues of the proximal left thigh measuring 2.4 cm in diameter. Soft tissues appear otherwise within normal limits.  IMPRESSION: Moderate right and mild left hip osteoarthritis, with minimal interval progression from prior.  Electronically Signed   By: Davina Poke D.O.   On: 02/11/2020 15:50 DG HIP UNILAT W OR W/O PELVIS 2-3 VIEWS RIGHT CLINICAL DATA:  Chronic bilateral hip pain  EXAM: DG HIP (WITH  OR WITHOUT PELVIS) 2-3V RIGHT; DG HIP (WITH OR WITHOUT PELVIS) 2-3V LEFT  COMPARISON:  04/30/2016  FINDINGS: No evidence of acute fracture or dislocation. There is moderate joint space narrowing of the right hip and mild joint space narrowing of the left hip with minimal interval progression from prior. Mild arthropathy of the bilateral SI joints. Nonspecific benign-appearing soft tissue calcification within the lateral soft tissues of the proximal left thigh measuring 2.4 cm in diameter. Soft tissues appear otherwise within normal limits.  IMPRESSION: Moderate right and mild left hip osteoarthritis, with minimal interval progression from prior.  Electronically Signed   By: Davina Poke D.O.   On: 02/11/2020 15:50 Note: Reviewed        Physical Exam  General appearance: Well nourished, well developed, and well hydrated. In no apparent acute distress Mental status: Alert, oriented x 3 (person, place, & time)       Respiratory: No evidence of acute respiratory distress Eyes: PERLA Vitals: BP (!) 142/75 (BP Location: Left Arm, Patient Position: Sitting, Cuff Size: Normal)   Pulse (!) 101   Temp (!) 96.7 F (35.9 C) (Temporal)   Resp 18   Ht 5' 8"  (1.727 m)   Wt 200 lb (90.7 kg)   LMP 03/13/2015 (Approximate) Comment: more than 2 years ago  SpO2 98%   BMI 30.41 kg/m  BMI: Estimated body mass index is 30.41 kg/m as calculated from the following:   Height as of this encounter: 5' 8"  (1.727 m).   Weight as of this encounter: 200 lb (90.7 kg). Ideal: Ideal body weight: 63.9 kg (140 lb 14 oz) Adjusted ideal body weight: 74.6 kg (164 lb 8.4 oz)  Assessment   Status Diagnosis  Controlled Controlled Controlled 1. Chronic pain syndrome   2. Chronic low back pain (Primary Area of Pain) (Bilateral) (L>R)   3. Chronic hip pain (Secondary area of Pain) (Bilateral) (L>R)   4. Chronic knee pain (  Third area of Pain) (Bilateral) (L>R)   5. Intermittent left thoracic Muscle  cramps   6. Spasm of thoracolumbar muscle (Left)   7. Pharmacologic therapy   8. Uncomplicated opioid dependence ([place])      Updated Problems: Problem  Uncomplicated Opioid Dependence ([facility])    Plan of Care  Problem-specific:  No problem-specific Assessment & Plan notes found for this encounter.  [name] has a current medication list which includes the following long-term medication(s): atorvastatin, vitamin d3, clonazepam, diltiazem, diltiazem, duloxetine, fluticasone-salmeterol, furosemide, metformin, pantoprazole, pregabalin, spironolactone, sucralfate, tiotropium, tizanidine, morphine, [START ON 04/25/2020] morphine, and [START ON 05/25/2020] morphine.  Pharmacotherapy (Medications Ordered): Meds ordered this encounter  Medications  . morphine (MS CONTIN) 15 MG 12 hr tablet    Sig: Take 1 tablet (15 mg total) by mouth every 12 (twelve) hours. Must last 30 days. Do not break tablet    Dispense:  60 tablet    Refill:  0    Chronic Pain: STOP Act (Not applicable) Fill 1 day early if closed on refill date. Avoid benzodiazepines within 8 hours of opioids  . morphine (MS CONTIN) 15 MG 12 hr tablet    Sig: Take 1 tablet (15 mg total) by mouth every 12 (twelve) hours. Must last 30 days. Do not break tablet    Dispense:  60 tablet    Refill:  0    Chronic Pain: STOP Act (Not applicable) Fill 1 day early if closed on refill date. Avoid benzodiazepines within 8 hours of opioids  . morphine (MS CONTIN) 15 MG 12 hr tablet    Sig: Take 1 tablet (15 mg total) by mouth every 12 (twelve) hours. Must last 30 days. Do not break tablet    Dispense:  60 tablet    Refill:  0    Chronic Pain: STOP Act (Not applicable) Fill 1 day early if closed on refill date. Avoid benzodiazepines within 8 hours of opioids   Orders:  No orders of the defined types were placed in this encounter.  Follow-up plan:   Return in about 3 months (around 06/24/2020) for (F2F), (Med Mgmt) to evaluate morphine  change.      Interventional treatment options: Under consideration:   Therapeutic right lumbar facet RFA#1 Diagnostic bilateral SI joint block Diagnostic right IA hip joint injection Diagnostic bilateral genicular NB Possible bilateral genicular nerve RFA  Diagnostic left LESI  Diagnostic right CESI  Diagnostic bilateral cervical facet block  Diagnostic bilateral GONB  Possible greater occipital nerve RFA    Therapeutic/palliative (PRN):   Palliative left lumbar facet RFA #2 (last one done on 06/28/2019) Palliative left IA hip joint injection #2  Palliative bilateral lumbar facet block #5  Palliative bilateral IA Hyalgan knee injection #S2/N1 (last done on 11/08/2017)     Recent Visits Date Type Provider Dept  03/26/20 Office Visit Milinda Pointer, MD [facility]  02/11/20 Office Visit Milinda Pointer, MD [facility]  Showing recent visits within past 90 days and meeting all other requirements Future Appointments Date Type Provider Dept  06/23/20 Appointment Milinda Pointer, MD [facility]  Showing future appointments within next 90 days and meeting all other requirements  I discussed the assessment and treatment plan with the patient. The patient was provided an opportunity to ask questions and all were answered. The patient agreed with the plan and demonstrated an understanding of the instructions.  Patient advised to call back or seek an in-person evaluation if the symptoms or condition  worsens.  Duration of encounter: 30 minutes.  Note by: Gaspar Cola, MD Date: 03/26/2020; Time: 6:42 AM

## 2020-03-26 NOTE — Patient Instructions (Signed)
____________________________________________________________________________________________  Drug Holidays (Slow)  What is a "Drug Holiday"? Drug Holiday: is the name given to the period of time during which a patient stops taking a medication(s) for the purpose of eliminating tolerance to the drug.  Benefits . Improved effectiveness of opioids. . Decreased opioid dose needed to achieve benefits. . Improved pain with lesser dose.  What is tolerance? Tolerance: is the progressive decreased in effectiveness of a drug due to its repetitive use. With repetitive use, the body gets use to the medication and as a consequence, it loses its effectiveness. This is a common problem seen with opioid pain medications. As a result, a larger dose of the drug is needed to achieve the same effect that used to be obtained with a smaller dose.  How long should a "Drug Holiday" last? You should stay off of the pain medicine for at least 14 consecutive days. (2 weeks)  Should I stop the medicine "cold turkey"? No. You should always coordinate with your Pain Specialist so that he/she can provide you with the correct medication dose to make the transition as smoothly as possible.  How do I stop the medicine? Slowly. You will be instructed to decrease the daily amount of pills that you take by one (1) pill every seven (7) days. This is called a "slow downward taper" of your dose. For example: if you normally take four (4) pills per day, you will be asked to drop this dose to three (3) pills per day for seven (7) days, then to two (2) pills per day for seven (7) days, then to one (1) per day for seven (7) days, and at the end of those last seven (7) days, this is when the "Drug Holiday" would start.   Will I have withdrawals? By doing a "slow downward taper" like this one, it is unlikely that you will experience any significant withdrawal symptoms. Typically, what triggers withdrawals is the sudden stop of a high  dose opioid therapy. Withdrawals can usually be avoided by slowly decreasing the dose over a prolonged period of time. If you do not follow these instructions and decide to stop your medication abruptly, withdrawals may be possible.  What are withdrawals? Withdrawals: refers to the wide range of symptoms that occur after stopping or dramatically reducing opiate drugs after heavy and prolonged use. Withdrawal symptoms do not occur to patients that use low dose opioids, or those who take the medication sporadically. Contrary to benzodiazepine (example: Valium, Xanax, etc.) or alcohol withdrawals ("Delirium Tremens"), opioid withdrawals are not lethal. Withdrawals are the physical manifestation of the body getting rid of the excess receptors.  Expected Symptoms Early symptoms of withdrawal may include: . Agitation . Anxiety . Muscle aches . Increased tearing . Insomnia . Runny nose . Sweating . Yawning  Late symptoms of withdrawal may include: . Abdominal cramping . Diarrhea . Dilated pupils . Goose bumps . Nausea . Vomiting  Will I experience withdrawals? Due to the slow nature of the taper, it is very unlikely that you will experience any.  What is a slow taper? Taper: refers to the gradual decrease in dose.  (Last update: 02/06/2020) ____________________________________________________________________________________________    ____________________________________________________________________________________________  Medication Rules  Purpose: To inform patients, and their family members, of our rules and regulations.  Applies to: All patients receiving prescriptions (written or electronic).  Pharmacy of record: Pharmacy where electronic prescriptions will be sent. If written prescriptions are taken to a different pharmacy, please inform the nursing staff. The pharmacy   listed in the electronic medical record should be the one where you would like electronic prescriptions  to be sent.  Electronic prescriptions: In compliance with the [place] Strengthen Opioid Misuse Prevention (STOP) Act of 2017 (Session Law 2017-74/H243), effective July 19, 2018, all controlled substances must be electronically prescribed. Calling prescriptions to the pharmacy will cease to exist.  Prescription refills: Only during scheduled appointments. Applies to all prescriptions.  NOTE: The following applies primarily to controlled substances (Opioid* Pain Medications).   Type of encounter (visit): For patients receiving controlled substances, face-to-face visits are required. (Not an option or up to the patient.)  Patient's responsibilities: 1. Pain Pills: Bring all pain pills to every appointment (except for procedure appointments). 2. Pill Bottles: Bring pills in original pharmacy bottle. Always bring the newest bottle. Bring bottle, even if empty. 3. Medication refills: You are responsible for knowing and keeping track of what medications you take and those you need refilled. The day before your appointment: write a list of all prescriptions that need to be refilled. The day of the appointment: give the list to the admitting nurse. Prescriptions will be written only during appointments. No prescriptions will be written on procedure days. If you forget a medication: it will not be "Called in", "Faxed", or "electronically sent". You will need to get another appointment to get these prescribed. No early refills. Do not call asking to have your prescription filled early. 4. Prescription Accuracy: You are responsible for carefully inspecting your prescriptions before leaving our office. Have the discharge nurse carefully go over each prescription with you, before taking them home. Make sure that your name is accurately spelled, that your address is correct. Check the name and dose of your medication to make sure it is accurate. Check the number of pills, and the written instructions to  make sure they are clear and accurate. Make sure that you are given enough medication to last until your next medication refill appointment. 5. Taking Medication: Take medication as prescribed. When it comes to controlled substances, taking less pills or less frequently than prescribed is permitted and encouraged. Never take more pills than instructed. Never take medication more frequently than prescribed.  6. Inform other Doctors: Always inform, all of your healthcare providers, of all the medications you take. 7. Pain Medication from other Providers: You are not allowed to accept any additional pain medication from any other Doctor or Healthcare provider. There are two exceptions to this rule. (see below) In the event that you require additional pain medication, you are responsible for notifying us, as stated below. 8. Medication Agreement: You are responsible for carefully reading and following our Medication Agreement. This must be signed before receiving any prescriptions from our practice. Safely store a copy of your signed Agreement. Violations to the Agreement will result in no further prescriptions. (Additional copies of our Medication Agreement are available upon request.) 9. Laws, Rules, & Regulations: All patients are expected to follow all Federal and State Laws, Statutes, Rules, & Regulations. Ignorance of the Laws does not constitute a valid excuse.  10. Illegal drugs and Controlled Substances: The use of illegal substances (including, but not limited to marijuana and its derivatives) and/or the illegal use of any controlled substances is strictly prohibited. Violation of this rule may result in the immediate and permanent discontinuation of any and all prescriptions being written by our practice. The use of any illegal substances is prohibited. 11. Adopted CDC guidelines & recommendations: Target dosing levels will be at or   below 60 MME/day. Use of benzodiazepines** is not  recommended.  Exceptions: There are only two exceptions to the rule of not receiving pain medications from other Healthcare Providers. 1. Exception #1 (Emergencies): In the event of an emergency (i.e.: accident requiring emergency care), you are allowed to receive additional pain medication. However, you are responsible for: As soon as you are able, call our office [phone], at any time of the day or night, and leave a message stating your name, the date and nature of the emergency, and the name and dose of the medication prescribed. In the event that your call is answered by a member of our staff, make sure to document and save the date, time, and the name of the person that took your information.  2. Exception #2 (Planned Surgery): In the event that you are scheduled by another doctor or dentist to have any type of surgery or procedure, you are allowed (for a period no longer than 30 days), to receive additional pain medication, for the acute post-op pain. However, in this case, you are responsible for picking up a copy of our "Post-op Pain Management for Surgeons" handout, and giving it to your surgeon or dentist. This document is available at our office, and does not require an appointment to obtain it. Simply go to our office during business hours (Monday-Thursday from 8:00 AM to 4:00 PM) (Friday 8:00 AM to 12:00 Noon) or if you have a scheduled appointment with us, prior to your surgery, and ask for it by name. In addition, you are responsible for: calling our office [phone], at any time of the day or night, and leaving a message stating your name, name of your surgeon, type of surgery, and date of procedure or surgery. Failure to comply with your responsibilities may result in termination of therapy involving the controlled substances.  *Opioid medications include: morphine, codeine, oxycodone, oxymorphone, hydrocodone, hydromorphone, meperidine, tramadol, tapentadol, buprenorphine,  fentanyl, methadone. **Benzodiazepine medications include: diazepam (Valium), alprazolam (Xanax), clonazepam (Klonopine), lorazepam (Ativan), clorazepate (Tranxene), chlordiazepoxide (Librium), estazolam (Prosom), oxazepam (Serax), temazepam (Restoril), triazolam (Halcion) (Last updated: 03/25/2020) ____________________________________________________________________________________________   ____________________________________________________________________________________________  Medication Recommendations and Reminders  Applies to: All patients receiving prescriptions (written and/or electronic).  Medication Rules & Regulations: These rules and regulations exist for your safety and that of others. They are not flexible and neither are we. Dismissing or ignoring them will be considered "non-compliance" with medication therapy, resulting in complete and irreversible termination of such therapy. (See document titled "Medication Rules" for more details.) In all conscience, because of safety reasons, we cannot continue providing a therapy where the patient does not follow instructions.  Pharmacy of record:   Definition: This is the pharmacy where your electronic prescriptions will be sent.   We do not endorse any particular pharmacy, however, we have experienced problems with [facility] not securing enough medication supply for the community.  We do not restrict you in your choice of pharmacy. However, once we write for your prescriptions, we will NOT be re-sending more prescriptions to fix restricted supply problems created by your pharmacy, or your insurance.   The pharmacy listed in the electronic medical record should be the one where you want electronic prescriptions to be sent.  If you choose to change pharmacy, simply notify our nursing staff.  Recommendations:  Keep all of your pain medications in a safe place, under lock and key, even if you live alone. We will NOT replace lost,  stolen, or damaged medication.  After   you fill your prescription, take 1 week's worth of pills and put them away in a safe place. You should keep a separate, properly labeled bottle for this purpose. The remainder should be kept in the original bottle. Use this as your primary supply, until it runs out. Once it's gone, then you know that you have 1 week's worth of medicine, and it is time to come in for a prescription refill. If you do this correctly, it is unlikely that you will ever run out of medicine.  To make sure that the above recommendation works, it is very important that you make sure your medication refill appointments are scheduled at least 1 week before you run out of medicine. To do this in an effective manner, make sure that you do not leave the office without scheduling your next medication management appointment. Always ask the nursing staff to show you in your prescription , when your medication will be running out. Then arrange for the receptionist to get you a return appointment, at least 7 days before you run out of medicine. Do not wait until you have 1 or 2 pills left, to come in. This is very poor planning and does not take into consideration that we may need to cancel appointments due to bad weather, sickness, or emergencies affecting our staff.  DO NOT ACCEPT A "Partial Fill": If for any reason your pharmacy does not have enough pills/tablets to completely fill or refill your prescription, do not allow for a "partial fill". The law allows the pharmacy to complete that prescription within 72 hours, without requiring a new prescription. If they do not fill the rest of your prescription within those 72 hours, you will need a separate prescription to fill the remaining amount, which we will NOT provide. If the reason for the partial fill is your insurance, you will need to talk to the pharmacist about payment alternatives for the remaining tablets, but again, DO NOT ACCEPT A PARTIAL FILL,  unless you can trust your pharmacist to obtain the remainder of the pills within 72 hours.  Prescription refills and/or changes in medication(s):   Prescription refills, and/or changes in dose or medication, will be conducted only during scheduled medication management appointments. (Applies to both, written and electronic prescriptions.)  No refills on procedure days. No medication will be changed or started on procedure days. No changes, adjustments, and/or refills will be conducted on a procedure day. Doing so will interfere with the diagnostic portion of the procedure.  No phone refills. No medications will be "called into the pharmacy".  No Fax refills.  No weekend refills.  No Holliday refills.  No after hours refills.  Remember:  Business hours are:  Monday to Thursday 8:00 AM to 4:00 PM Provider's Schedule: Veronda Gabor, MD - Appointments are:  Medication management: Monday and Wednesday 8:00 AM to 4:00 PM Procedure day: Tuesday and Thursday 7:30 AM to 4:00 PM Bilal Lateef, MD - Appointments are:  Medication management: Tuesday and Thursday 8:00 AM to 4:00 PM Procedure day: Monday and Wednesday 7:30 AM to 4:00 PM (Last update: 02/06/2020) ____________________________________________________________________________________________   ____________________________________________________________________________________________  CBD (cannabidiol) WARNING  Applicable to: All individuals currently taking or considering taking CBD (cannabidiol) and, more important, all patients taking opioid analgesic controlled substances (pain medication). (Example: oxycodone; oxymorphone; hydrocodone; hydromorphone; morphine; methadone; tramadol; tapentadol; fentanyl; buprenorphine; butorphanol; dextromethorphan; meperidine; codeine; etc.)  Legal status: CBD remains a Schedule I drug prohibited for any use. CBD is illegal with one exception. In the   [place], CBD has a limited Food  and Drug Administration (FDA) approval for the treatment of two specific types of epilepsy disorders. Only one CBD product has been approved by the FDA for this purpose: "Epidiolex". FDA is aware that some companies are marketing products containing cannabis and cannabis-derived compounds in ways that violate the Federal Food, Drug and Cosmetic Act (FD&C Act) and that may put the health and safety of consumers at risk. The FDA, a Federal agency, has not enforced the CBD status since 2018.   Legality: Some manufacturers ship CBD products nationally, which is illegal. Often such products are sold online and are therefore available throughout the country. CBD is openly sold in head shops and health food stores in some states where such sales have not been explicitly legalized. Selling unapproved products with unsubstantiated therapeutic claims is not only a violation of the law, but also can put patients at risk, as these products have not been proven to be safe or effective. Federal illegality makes it difficult to conduct research on CBD.  Reference: "FDA Regulation of Cannabis and Cannabis-Derived Products, Including Cannabidiol (CBD)" - https://www.fda.gov/news-events/public-health-focus/fda-regulation-cannabis-and-cannabis-derived-products-including-cannabidiol-cbd  Warning: CBD is not FDA approved and has not undergo the same manufacturing controls as prescription drugs.  This means that the purity and safety of available CBD may be questionable. Most of the time, despite manufacturer's claims, it is contaminated with THC (delta-9-tetrahydrocannabinol - the chemical in marijuana responsible for the "HIGH").  When this is the case, the THC contaminant will trigger a positive urine drug screen (UDS) test for Marijuana (carboxy-THC). Because a positive UDS for any illicit substance is a violation of our medication agreement, your opioid analgesics (pain medicine) may be permanently discontinued.  MORE ABOUT  CBD  General Information: CBD  is a derivative of the Marijuana (cannabis sativa) plant discovered in 1940. It is one of the 113 identified substances found in Marijuana. It accounts for up to 40% of the plant's extract. As of 2018, preliminary clinical studies on CBD included research for the treatment of anxiety, movement disorders, and pain. CBD is available and consumed in multiple forms, including inhalation of smoke or vapor, as an aerosol spray, and by mouth. It may be supplied as an oil containing CBD, capsules, dried cannabis, or as a liquid solution. CBD is thought not to be as psychoactive as THC (delta-9-tetrahydrocannabinol - the chemical in marijuana responsible for the "HIGH"). Studies suggest that CBD may interact with different biological target receptors in the body, including cannabinoid and other neurotransmitter receptors. As of 2018 the mechanism of action for its biological effects has not been determined.  Side-effects  Adverse reactions: Dry mouth, diarrhea, decreased appetite, fatigue, drowsiness, malaise, weakness, sleep disturbances, and others.  Drug interactions: CBC may interact with other medications such as blood-thinners. (Last update: 02/23/2020) ____________________________________________________________________________________________    

## 2020-03-26 NOTE — Progress Notes (Signed)
Nursing Pain Medication Assessment:  Safety precautions to be maintained throughout the outpatient stay will include: orient to surroundings, keep bed in low position, maintain call bell within reach at all times, provide assistance with transfer out of bed and ambulation.  Medication Inspection Compliance: Pill count conducted under aseptic conditions, in front of the patient. Neither the pills nor the bottle was removed from the patient's sight at any time. Once count was completed pills were immediately returned to the patient in their original bottle.  Medication: Oxycodone IR Pill/Patch Count: 10 of 90 pills remain Pill/Patch Appearance: Markings consistent with prescribed medication Bottle Appearance: Standard pharmacy container. Clearly labeled. Filled Date: 08 / 11 / 2021 Last Medication intake:  Today

## 2020-03-31 DIAGNOSIS — F1120 Opioid dependence, uncomplicated: Secondary | ICD-10-CM | POA: Insufficient documentation

## 2020-04-25 ENCOUNTER — Ambulatory Visit (INDEPENDENT_AMBULATORY_CARE_PROVIDER_SITE_OTHER): Payer: Medicaid Other | Admitting: Physician Assistant

## 2020-04-25 ENCOUNTER — Emergency Department
Admission: EM | Admit: 2020-04-25 | Discharge: 2020-04-25 | Disposition: A | Discharge status: Home / Self Care | Payer: Medicaid Other | Attending: Emergency Medicine | Admitting: Emergency Medicine

## 2020-04-25 ENCOUNTER — Other Ambulatory Visit: Payer: Self-pay

## 2020-04-25 ENCOUNTER — Encounter: Payer: Self-pay | Admitting: Emergency Medicine

## 2020-04-25 ENCOUNTER — Emergency Department: Payer: Medicaid Other

## 2020-04-25 ENCOUNTER — Encounter: Payer: Self-pay | Admitting: Physician Assistant

## 2020-04-25 VITALS — BP 130/60 | HR 100 | Ht 68.0 in | Wt 202.2 lb

## 2020-04-25 DIAGNOSIS — R059 Cough, unspecified: Secondary | ICD-10-CM | POA: Diagnosis not present

## 2020-04-25 DIAGNOSIS — J209 Acute bronchitis, unspecified: Secondary | ICD-10-CM

## 2020-04-25 DIAGNOSIS — Z5321 Procedure and treatment not carried out due to patient leaving prior to being seen by health care provider: Secondary | ICD-10-CM | POA: Insufficient documentation

## 2020-04-25 DIAGNOSIS — R000 Tachycardia, unspecified: Secondary | ICD-10-CM

## 2020-04-25 DIAGNOSIS — I272 Pulmonary hypertension, unspecified: Secondary | ICD-10-CM | POA: Diagnosis not present

## 2020-04-25 DIAGNOSIS — R0602 Shortness of breath: Secondary | ICD-10-CM | POA: Diagnosis present

## 2020-04-25 DIAGNOSIS — E119 Type 2 diabetes mellitus without complications: Secondary | ICD-10-CM | POA: Diagnosis not present

## 2020-04-25 DIAGNOSIS — Z20822 Contact with and (suspected) exposure to covid-19: Secondary | ICD-10-CM | POA: Diagnosis not present

## 2020-04-25 DIAGNOSIS — R7989 Other specified abnormal findings of blood chemistry: Secondary | ICD-10-CM

## 2020-04-25 DIAGNOSIS — I5032 Chronic diastolic (congestive) heart failure: Secondary | ICD-10-CM

## 2020-04-25 DIAGNOSIS — J440 Chronic obstructive pulmonary disease with acute lower respiratory infection: Secondary | ICD-10-CM

## 2020-04-25 DIAGNOSIS — I10 Essential (primary) hypertension: Secondary | ICD-10-CM

## 2020-04-25 LAB — CBC WITH DIFFERENTIAL/PLATELET
Abs Immature Granulocytes: 0.03 10*3/uL (ref 0.00–0.07)
Basophils Absolute: 0.1 10*3/uL (ref 0.0–0.1)
Basophils Relative: 1 %
Eosinophils Absolute: 0.3 10*3/uL (ref 0.0–0.5)
Eosinophils Relative: 3 %
HCT: 38.4 % (ref 36.0–46.0)
Hemoglobin: 12.6 g/dL (ref 12.0–15.0)
Immature Granulocytes: 0 %
Lymphocytes Relative: 26 %
Lymphs Abs: 2.7 10*3/uL (ref 0.7–4.0)
MCH: 29.1 pg (ref 26.0–34.0)
MCHC: 32.8 g/dL (ref 30.0–36.0)
MCV: 88.7 fL (ref 80.0–100.0)
Monocytes Absolute: 0.8 10*3/uL (ref 0.1–1.0)
Monocytes Relative: 8 %
Neutro Abs: 6.3 10*3/uL (ref 1.7–7.7)
Neutrophils Relative %: 62 %
Platelets: 206 10*3/uL (ref 150–400)
RBC: 4.33 MIL/uL (ref 3.87–5.11)
RDW: 17.3 % — ABNORMAL HIGH (ref 11.5–15.5)
WBC: 10.2 10*3/uL (ref 4.0–10.5)
nRBC: 0 % (ref 0.0–0.2)

## 2020-04-25 LAB — COMPREHENSIVE METABOLIC PANEL
ALT: 36 U/L (ref 0–44)
AST: 37 U/L (ref 15–41)
Albumin: 3.9 g/dL (ref 3.5–5.0)
Alkaline Phosphatase: 72 U/L (ref 38–126)
Anion gap: 14 (ref 5–15)
BUN: 13 mg/dL (ref 6–20)
CO2: 24 mmol/L (ref 22–32)
Calcium: 9.4 mg/dL (ref 8.9–10.3)
Chloride: 99 mmol/L (ref 98–111)
Creatinine, Ser: 0.77 mg/dL (ref 0.44–1.00)
GFR calc non Af Amer: >60 mL/min (ref >60)
Glucose, Bld: 173 mg/dL — ABNORMAL HIGH (ref 70–99)
Potassium: 3.7 mmol/L (ref 3.5–5.1)
Sodium: 137 mmol/L (ref 135–145)
Total Bilirubin: 1 mg/dL (ref 0.3–1.2)
Total Protein: 7.6 g/dL (ref 6.5–8.1)

## 2020-04-25 LAB — BRAIN NATRIURETIC PEPTIDE: B Natriuretic Peptide: 37.7 pg/mL (ref 0.0–100.0)

## 2020-04-25 LAB — RESPIRATORY PANEL BY RT PCR (FLU A&B, COVID)
Influenza A by PCR: NEGATIVE
Influenza B by PCR: NEGATIVE
SARS Coronavirus 2 by RT PCR: NEGATIVE

## 2020-04-25 LAB — TROPONIN I (HIGH SENSITIVITY): Troponin I (High Sensitivity): 6 ng/L (ref <18)

## 2020-04-25 NOTE — Progress Notes (Signed)
Office Visit    Patient Name: Erica [name] Date of Encounter: 04/25/2020  Primary Care Provider:  Denton Lank, MD Primary Cardiologist:  Ida Rogue, MD  Chief Complaint    Chief Complaint  Patient presents with  . OTHER    OD 12 month f/u c/o going to ED last night due to sob, coughing and feels like she has a sinus infection. Meds reviewed verbally with pt.   54 yo female with a hx of HFpEF (EF 55-65%, 10/2017), asthma, COPD, DM2, hypertension, hyperlipidemia, h/o current alcohol use and current tobacco abuse (1 pk daily), h/o recent pancreatitis and electrolyte abnormalities 07/2018, OA on pain management, and OSA on patient reported home O2 who is being seen today for 12 month follow-up with recent sinus infection.  Past Medical History    Past Medical History:  Diagnosis Date  . (HFpEF) heart failure with preserved ejection fraction ([place])    a. 05/2015 Echo: EF 60-65%, no rwma, PASP 13mHg.  .Marland KitchenAcute pancreatitis 08/13/2018  . Arthritis   . Asthma   . Bell's palsy   . Bronchitis   . CHF ([place]estive heart failure) ([place]   . COPD ([place] obstructive pulmonary disease) ([place]   . Diabetes mellitus without complication ([place]    type II 03/2017  . Gastric ulcer   . Hyperlipidemia   . Hypertension   . OSA (obstructive sleep apnea)    a. did not tolerate CPAP.  .Marland KitchenPancreatitis    07/2018  . Shoulder injury    6/19   Past Surgical History:  Procedure Laterality Date  . FLEXIBLE BRONCHOSCOPY N/A 06/20/2015   Procedure: FLEXIBLE BRONCHOSCOPY;  Surgeon: PLaverle Hobby MD;  Location: [facility] ORS;  Service: Pulmonary;  Laterality: N/A;  . KNEE SURGERY Left    8 knee surgeries    Allergies  Allergies  Allergen Reactions  . Tramadol Other (See Comments) and Palpitations    Heart Skipping Beats Heart palpatations  . Penicillins Rash    Has patient had a PCN reaction causing immediate rash, facial/tongue/throat swelling, SOB or lightheadedness with hypotension:  Yes Has patient had a PCN reaction causing severe rash involving mucus membranes or skin necrosis: No Has patient had a PCN reaction that required hospitalization: No Has patient had a PCN reaction occurring within the last 10 years: No If all of the above answers are "NO", then may proceed with Cephalosporin use.    History of Present Illness    Erica MOHAMMADis a 54y.o. adult with PMH as above. Review of EMR, she also has a history of medication noncompliance secondary to financial and insurance issues, chronic thrush, and chronic back pain.  [facility]  At that time, it was noted she continued to be followed in the heart failure clinic with last visit December 2018.  During that office visit, she noted intermittent chest discomfort, occurring in the evening in the setting of elevated blood pressure while at rest.  Chest pain may or may not have been associated with DOE.  She reported symptoms typically lasted under 5 minutes and resolve spontaneously.  She also felt there was alleviation of chest discomfort when she took diltiazem 30 mg PRN when experiencing chest discomfort in the setting of elevated pressures.  It was noted that she had previously been prescribed losartan 100 mg daily but felt that it may have lowered her blood pressure too much and therefore was only taking half tablet daily at that time.  She reported home blood pressures in the mornings with systolic blood pressure in the 130s but evening pressures in the 170s to 180s.  She noted that the chest discomfort typically occurred with elevated SBP.  She also noted some degree of chronic DOE and admitted to being fairly sedentary.  Given her risk factors for cardiac disease, stress test was ordered and completed 10/21/2017 and ruled low risk study.  Left ventricular ejection fraction was estimated at 55 to 65% at that time.  She was admitted to [facility] 08/12/2018 with recommendation for admission for viral  hepatitis but went home without further work-up. She was then admitted to [facility] 1/26 for pancreatitis. Subsequently, she reported a bout of bronchitis.  She was admitted to [facility] 08/2018 with report of CP attributed to alcoholic ketoacidosis and acute alcoholic gastritis. She reported alcohol use with most recent alcohol the night prior to her morning chest pain on 2/23.  She reported that she had several white [place] drinks to celebrate her birthday the previous night 2/22 and with friends.  Other than celebrating with friends, she reported only occasional alcohol use.  She had stopped her ARB/losartan due to "blacking out and falling and hurting herself repeatedly, despite reduced dosage." She was using home oxygen. She was found to have acute hypokalemia.  Over the last year or two since then, she has noted home SBP in the 130s and intermittent palpitations though not recently. She has continued to be fairly sedentary, reporting hip and back issues with DOE.  She has not worn a CPAP at night despite known OSA as the previous CPAP mask she tried gave her panic attacks. She continues breathing treatments for asthma. She sleeps in a recliner but states this is in part d/t OSA and back and hip pain. She has previously quit smoking, but recently restarted smoking as her husband was ill and subsequently passed away from alcohol use per patient.   She was seen via virtual visit 10/31/2018 by her primary cardiologist. At that time, she noted that she was doing well. She wondered if she had broken her left foot or small toe, she had hit a recliner with her left foot by accident. She reported essentially resolved lower extremity edema on Lasix 40 mg daily. She would occasionally take an extra Lasix 20 mg in the p.m. She was moderating her fluid intake. She reported the occasional cigarette.  Today, 04/25/2020, she reports that she has been feeling poorly for the last couple of weeks. She reports full body edema over  the last week, as well as bilateral lower extremity edema. She is coughing up green phlegm. She also notes PND, which she attributes to what she believes to be a recent bout of bronchitis, as well as not using her CPAP. She continues to sleep in a recliner (orthopnea), though for reasons attributed to her pain and as she does not use a CPAP due to the anxiety associated with the mask. She is currently taking her Lasix 40 mg daily with 1-2 extra in the afternoons. She reports a home weight of 200 pounds. She states her blood pressure cuff is broken; therefore, she does not have recent blood pressure estimates. With her Lasix, she notes adequate urine output though notes concern regarding a weak urine stream. She reports drinking between 8 to 9 cups of water per day; however, she states that these are very small cups. Every once in a while, she will reportedly have a Pepsi. She reports urine output is yellow  and clear. She reports baseline dyspnea and shortness of breath, which has recently worsened due to bronchitis with request for antibiotics as below. She states that she has an ongoing prescription for Chantix, as she does want to quit smoking. She is currently smoking 1 or 2 cigarettes every couple of weeks, estimating approximately 4 cigarettes/month. She reports that her husband just died, which has made quitting smoking very hard for her. She reports that she is not as active due to her hip and knee pain and lives a very sedentary lifestyle. She denies any signs or symptoms of bleeding. She reports medication compliance. She requests antibiotics today, stating that she is fighting bronchitis and cannot get into her PCP for several months. She attempted to call her PCP and request antibiotics; however, she reportedly needs to come into the office. She went to the ED last night to get antibiotics but left without any antibiotics. She states "from a heart standpoint, I am actually doing better than ever, but I  just need abx and cannot get them from my PCP."  Home Medications    Prior to Admission medications   Medication Sig Start Date End Date Taking? Authorizing Provider  albuterol (PROAIR HFA) 108 (90 Base) MCG/ACT inhaler Inhale 2 puffs into the lungs every 6 (six) hours as needed for wheezing or shortness of breath.     [provider]  aspirin EC 81 MG EC tablet Take 1 tablet (81 mg total) by mouth daily. 06/23/15   Demetrios Loll, MD  atorvastatin (LIPITOR) 80 MG tablet Take 1 tablet (80 mg total) by mouth daily. 10/31/18   Minna Merritts, MD  Blood Glucose Monitoring Suppl (ACCU-CHEK AVIVA PLUS) w/Device KIT See admin instructions. 10/20/17   [provider]  busPIRone (BUSPAR) 10 MG tablet Take 20 mg by mouth 2 (two) times daily. 02/14/20   [provider]  CHANTIX STARTING MONTH PAK 0.5 MG X 11 & 1 MG X 42 tablet Take by mouth. 02/29/20   [provider]  Cholecalciferol (VITAMIN D3) 50 MCG (2000 UT) capsule Take 1 capsule (2,000 Units total) by mouth daily. Take 1 capsule (2,000 Units total) by mouth daily. 05/23/19 05/22/20  Milinda Pointer, MD  clonazePAM (KLONOPIN) 0.5 MG tablet Take 0.25 mg by mouth at bedtime as needed. 03/12/20   [provider]  diltiazem (CARDIZEM CD) 120 MG 24 hr capsule TAKE 1 CAPSULE(120 MG) BY MOUTH DAILY 12/31/19   Minna Merritts, MD  diltiazem (CARDIZEM) 30 MG tablet TAKE 1 TABLET(30 MG) BY MOUTH THREE TIMES DAILY AS NEEDED 12/31/19   Minna Merritts, MD  DULoxetine (CYMBALTA) 60 MG capsule Take 60 mg by mouth 2 (two) times daily.     [provider]  Fluticasone-Salmeterol (ADVAIR) 250-50 MCG/DOSE AEPB Inhale 1 puff into the lungs 2 (two) times daily.    [provider]  furosemide (LASIX) 40 MG tablet Take 1 tablet (40 mg total) by mouth daily. Take an additional 40 mg in the am for weight gain as needed 10/31/18   Minna Merritts, MD  Magnesium 500 MG CAPS once daily. 11/08/16   [provider]  metFORMIN (GLUCOPHAGE) 500 MG tablet Take 1 tablet (500 mg total) by mouth 2 (two) times daily with a meal. 05/04/18   Gregor Hams, MD  morphine (MS CONTIN) 15 MG 12 hr tablet Take 1 tablet (15 mg total) by mouth every 12 (twelve) hours. Must last 30 days. Do not break tablet 03/26/20 04/25/20  Milinda Pointer, MD  morphine (MS CONTIN) 15 MG 12 hr tablet Take 1 tablet (15 mg total) by mouth every 12 (twelve) hours. Must last 30 days. Do not break tablet 04/25/20 05/25/20  Milinda Pointer, MD  morphine (MS CONTIN) 15 MG 12 hr tablet Take 1 tablet (15 mg total) by mouth every 12 (twelve) hours. Must last 30 days. Do not break tablet 05/25/20 06/24/20  Milinda Pointer, MD  OXYGEN Inhale 2 L into the lungs at bedtime.     [provider]  pantoprazole (PROTONIX) 40 MG tablet Take 1 tablet (40 mg total) by mouth daily. 05/04/18   Gregor Hams, MD  potassium chloride SA (KLOR-CON) 20 MEQ tablet Take 20 mEq by mouth 2 (two) times daily. 02/15/20   [provider]  pregabalin (LYRICA) 150 MG capsule Take 1 capsule (150 mg total) by mouth 3 (three) times daily. 02/11/20 08/09/20  Milinda Pointer, MD  spironolactone (ALDACTONE) 25 MG tablet Take 25 mg by mouth every morning. 02/14/20   [provider]  sucralfate (CARAFATE) 1 G tablet Take 1 tablet (1 g total) by mouth 4 (four) times daily -  with meals and at bedtime. Patient taking differently: Take 1 g by mouth 2 (two) times daily.  06/23/15   Demetrios Loll, MD  tiotropium (SPIRIVA HANDIHALER) 18 MCG inhalation capsule Place 18 mcg into inhaler and inhale daily.    [provider]  tiZANidine (ZANAFLEX) 4 MG capsule Take 1 capsule (4 mg total) by mouth 3 (three) times daily as needed for muscle spasms. 02/16/20 08/14/20  Milinda Pointer, MD    Review of Systems    She reports stable dyspnea and shortness of breath, attributed to recent bout of bronchitis. She reports sedentary lifestyle. She reports cough  and green phlegm. She reports bilateral lower extremity edema and full body edema, also attributed to recent infection. She reports PND and orthopnea, attributed to not using a CPAP. She reports weak urine stream.   All other systems reviewed and are otherwise negative except as noted above.  Physical Exam    VS:  BP 130/60 (BP Location: Left Arm, Patient Position: Sitting, Cuff Size: Normal)   Pulse 100   Ht 5' 8"  (1.727 m)   Wt 202 lb 4 oz (91.7 kg)   LMP 03/13/2015 (Approximate) Comment: more than 2 years ago  SpO2 97%   BMI 30.75 kg/m  , BMI Body mass index is 30.75 kg/m. GEN: Well nourished, well developed, in no acute distress. HEENT: normal. Neck: Supple, no JVD, carotid bruits, or masses. Cardiac: Tachycardic but regular, no murmurs, rubs, or gallops. No clubbing, cyanosis,. 2+ bilateral edema with erythema.  Radials/DP/PT 2+ and equal bilaterally.  Respiratory:  Respirations regular and unlabored, coarse breath sounds bilaterally. GI: Soft, nontender, nondistended, BS + x 4. MS: no deformity or atrophy. Skin: warm and dry, no rash. Neuro:  Strength and sensation are intact. Psych: Normal affect.  Accessory Clinical Findings    ECG personally reviewed by me today - ST, 100bpm,low voltage, QTc 443 - no acute changes.  VITALS Reviewed today   Temp Readings from Last 3 Encounters:  04/25/20 98.5 F (36.9 C) (Oral)  03/26/20 (!) 96.7 F (35.9 C) (Temporal)  02/11/20 (!) 97.3 F (36.3 C) (Temporal)   BP Readings from Last 3 Encounters:  04/25/20 (!) 141/70  03/26/20 (!) 142/75  02/11/20 (!) 145/84   Pulse Readings from Last 3 Encounters:  04/25/20 92  03/26/20 (!) 101  02/11/20 89  Wt Readings from Last 3 Encounters:  04/25/20 200 lb (90.7 kg)  03/26/20 200 lb (90.7 kg)  02/11/20 198 lb (89.8 kg)     LABS  reviewed today   Lab Results  Component Value Date   WBC 10.2 04/25/2020   HGB 12.6 04/25/2020   HCT 38.4 04/25/2020   MCV 88.7 04/25/2020   PLT  206 04/25/2020   Lab Results  Component Value Date   CREATININE 0.77 04/25/2020   BUN 13 04/25/2020   NA 137 04/25/2020   K 3.7 04/25/2020   CL 99 04/25/2020   CO2 24 04/25/2020   Lab Results  Component Value Date   ALT 36 04/25/2020   AST 37 04/25/2020   ALKPHOS 72 04/25/2020   BILITOT 1.0 04/25/2020   Lab Results  Component Value Date   CHOL 145 09/11/2018   HDL 42 09/11/2018   LDLCALC 70 09/11/2018   TRIG 163 (H) 09/11/2018   CHOLHDL 3.5 09/11/2018    Lab Results  Component Value Date   HGBA1C 6.0 (H) 09/11/2018   Lab Results  Component Value Date   TSH 8.074 (H) 06/19/2015     STUDIES/PROCEDURES reviewed today   Echo 09/11/2018 1. The left ventricle has normal systolic function, with an ejection fraction of 55-60%. The cavity size was normal. There is mild concentric left ventricular hypertrophy. Left ventricular diastolic Doppler parameters are consistent with impaired  relaxation. 2. The right ventricle has normal systolic function. The cavity was normal. There is no increase in right ventricular wall thickness. 3. The mitral valve is normal in structure. 4. The tricuspid valve is normal in structure. 5. The aortic valve is tricuspid. 6. The pulmonic valve was normal in structure.  NM Study 10/21/2017  There was no ST segment deviation noted during stress.  The study is normal.  This is a low risk study.  The left ventricular ejection fraction is normal (55-65%).   Echo 05/2015  Left ventricle: The cavity size was normal. Systolic function was normal. The estimated ejection fraction was in the range of 60% to 65%. Wall motion was normal; there were no regional wall motion abnormalities. Left ventricular diastolic function parameters were normal. - Left atrium: The atrium was normal in size. - Right ventricle: Systolic function was normal. - Pulmonary arteries: Systolic pressure was moderately elevated. PA peak pressure: 56 mm Hg  (S). - Inferior vena cava: The vessel was dilated. The respirophasic diameter changes were blunted (<50%), consistent with elevated central venous pressure.  Assessment & Plan    Chronic HFpEF  - Reports SOB and DOE, attributed to bronchitis.  Consider also deconditioning and underlying lung dz. Reports orthopnea / PND attributed to bronchitis / lung dz / untreated OSA. Coughing up green phelgm. EF nl as above. She was seen in the ED last night and today with CXR recommendation for trial course abx and repeat CXR in 3-4 weeks to exclude malignancy (as under imaging tab). She was unable to get into see her PCP and requests abx today. Recommend she get these from her PCP and will also have our office reach out to her PCP regarding treatment. She does have bilateral edema on exam but otherwise euvolemic with BNP 37.7 from ED. Recommend ongoing lasix as prescribed and leg compression stockings / elevation for LEE.Continue PTA lasix 100m daily with PRN 1-2 additional lasix (additional 266mto 4061mRN later in day) and potassium supplementation for LEE. Labs from ED show K 3.7 ad Cr 0.77 with BUN 13.  She is not on an ACE/ARB due to report of blacking out in the past. Continue to monitor BP and daily weights. Recommend fluid intake under 2L and salt under 2g daily.   Pulmonary infection --Per PCP. As outlined in ED visit from earlier today. Defer to PCP for abx to treat her infection as indicated in CXR report. CXR showed rounded patchy airspace opacity at the medial R lung base, which could be infectious. Trial abx therapy with repeat CXR in 3-4 weeks recommended to exclude underlying malignancy.   Tachycardia Abnormal TSH --Suspect tachycardia 2/2 current infection; however, will recheck TSH/FT4 given 2016 TSH 8.074.    HTN - Currently borderline at 130/60 with recommendation for BP 130/80 or lower. Consider elevation 2/2 illness. Continue current medications, including lasix and spironolactone  48m daily. Low salt diet and fluid restrictions discussed. Not on ACE/ARB due to report of blacking out. Given her current infection, will defer from changing her medications at this time. Reassess BP at RTC and if still elevated, adjust medications at that time. Could consider increasing spironolactone if ongoing elevated BP at RTC given K on lower side today. Also could consider increased Cardizem from 1220mto 24073mr adjustment of PRN short acting Cardizem 61m56mily, given room in HR. Reassess at RTC.  HLD -08/2018 LDL 70. Will recheck lipid function. Continue atorvastatin 80 mg daily.  04/2020 ALT 36.  DM2 - 12/2018 A1C 6.0. Continue diabetes medications. Not on ACE/ARB due to previous report of blacking out. Continue glycemic control. Per PCP.  OSA, asthma, COPD Tobacco use - Continue breathing treatments, inhalers. Deconditioning likely also playing a role in breathing status.  --See CXR recommendations as above. Repeat CXR recommended in 3-4 weeks after trial abx to exclude underlying malignancy.  - Discussed need for CPAP and latest advancements in CPAP masks. - Restarted smoking as above. Smoking cessation advised.   Disposition: RTC 2 weeks for reassessment after treatment    JacqArvil Chaco-C 04/25/2020

## 2020-04-25 NOTE — ED Notes (Signed)
No answer in lobby several times when called to recheck vital signs and repeat trop.

## 2020-04-25 NOTE — Patient Instructions (Signed)
Medication Instructions:  No changes We will send information to your primary care provider regarding antibiotic.   *If you need a refill on your cardiac medications before your next appointment, please call your pharmacy*   Lab Work: TSH, Free T4, Lipid, Direct LDL Please go to the [facility] entrance to have labs done. No appointment is needed for these. Do not eat or drink after midnight except sip of water with medications. Please take lab slips with you and check in at registration. If you have any questions give [place] a call.  If you have labs (blood work) drawn today and your tests are completely normal, you will receive your results only by: Marland Kitchen MyChart Message (if you have MyChart) OR . A paper copy in the mail If you have any lab test that is abnormal or we need to change your treatment, we will call you to review the results.   Testing/Procedures: None   Follow-Up: At [facility], you and your health needs are our priority.  As part of our continuing mission to provide you with exceptional heart care, we have created designated Provider Care Teams.  These Care Teams include your primary Cardiologist (physician) and Advanced Practice Providers (APPs -  Physician Assistants and Nurse Practitioners) who all work together to provide you with the care you need, when you need it.  Your next appointment:   2 week(s)  The format for your next appointment:   In Person  Provider:   You may see Julien Nordmann, MD or one of the following Advanced Practice Providers on your designated Care Team:    Nicolasa Ducking, NP  Eula Listen, PA-C  Marisue Ivan, PA-C  Cadence [place], [place]

## 2020-04-25 NOTE — ED Triage Notes (Signed)
Patient ambulatory to triage with steady gait, without difficulty or distress noted; pt reports prod cough green sputum and swelling to legs that increased tonight with [facility]

## 2020-05-12 ENCOUNTER — Ambulatory Visit: Payer: Medicaid Other | Admitting: Physician Assistant

## 2020-05-19 ENCOUNTER — Other Ambulatory Visit: Payer: Self-pay | Admitting: Cardiovascular Disease

## 2020-05-19 NOTE — Telephone Encounter (Signed)
Rx request sent to pharmacy.  

## 2020-05-20 NOTE — Progress Notes (Deleted)
Cardiology Office Note    Date:  05/20/2020   ID:  Erica [name], DOB [birth year], MRN [id number]  PCP:  Hillery Aldo, MD  Cardiologist:  Julien Nordmann, MD  Electrophysiologist:  None   Chief Complaint: Follow up  History of Present Illness:   Erica [name] is a 54 y.o. 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  Remote echo in 05/2015 during admission for COPD exacerbation and pneumonia.  Showed an EF of 60 to 65%, no regional wall motion abnormalities, normal LV diastolic function, normal RV systolic function, and moderately elevated PASP estimated at 56 mmHg.  Nuclear stress test in 10/2017 for chest pain showed no significant ischemia or scar with an EF of 55 to 65% and was overall a low risk study.  Most recent echo from 08/2018 during admission for alcoholic ketoacidosis and acute alcoholic gastritis showed an EF of 55 to 60%, mild concentric LVH, diastolic dysfunction, normal RV systolic function with normal RV cavity size, and no significant valvular abnormalities.  More recently, patient presented to the ED on 04/25/2020 with cough productive of green sputum. Labs obtained in triage showed BNP 37, HS-Tn 6, Covid negative, WBC 10.2, HGB 12.6. CXR showed a rounded patchy airspace opacity at the medial right lung base possibly representing infectious etiology with recommendation for antibiotic therapy and follow up CXR in 3-4 weeks. EKG showed sinus rhythm with nonspecific st/t changes.  Patient left prior to having delta troponin drawn or being seen. Patient was seen in follow up in our office that same day with symptoms felt to be related to bronchitis and was advised to follow up with PCP for treatment and repeat CXR.    ***   Labs independently  reviewed: 04/2020 - potassium 3.7, BUN 13, serum creatinine 0.77, albumin 3.9, AST/ALT normal, Hgb 12.6, PLT 206 08/2018 - magnesium 2.0, A1c 6.0, TC 145, TG 163, HDL 42, LDL 70 06/2015 - TSH 8.074   Past Medical History:  Diagnosis Date  . (HFpEF) heart failure with preserved ejection fraction (HCC)    a. 05/2015 Echo: EF 60-65%, no rwma, PASP .  Marland Kitchen Acute pancreatitis 08/13/2018  . Arthritis   . Asthma   . Bell's palsy   . Bronchitis   . CHF (congestive heart failure) (HCC)   . COPD (chronic obstructive pulmonary disease) (HCC)   . Diabetes mellitus without complication (HCC)    type II 03/2017  . Gastric ulcer   . Hyperlipidemia   . Hypertension   . OSA (obstructive sleep apnea)    a. did not tolerate CPAP.  Marland Kitchen Pancreatitis    07/2018  . Shoulder injury    6/19    Past Surgical History:  Procedure Laterality Date  . FLEXIBLE BRONCHOSCOPY N/A 06/20/2015   Procedure: FLEXIBLE BRONCHOSCOPY;  Surgeon: Shane Crutch, MD;  Location: [facility] ORS;  Service: Pulmonary;  Laterality: N/A;  . KNEE SURGERY Left    8 knee surgeries    Current Medications: No outpatient medications have been marked as taking for the 05/21/20 encounter (Appointment) with Sondra Barges, PA-C.    Allergies:   Tramadol and Penicillins   Social History   Socioeconomic History  . Marital status: Widowed    Spouse name: Not on file  . Number of children: Not on file  . Years  of education: Not on file  . Highest education level: Not on file  Occupational History  . Occupation: unemployed  Tobacco Use  . Smoking status: Current Every Day Smoker    Packs/day: 1.00    Years: 36.00    Pack years: 36.00    Types: Cigarettes    Last attempt to quit: 06/13/2015    Years since quitting: 4.9  . Smokeless tobacco: Never Used  Vaping Use  . Vaping Use: Never used  Substance and Sexual Activity  . Alcohol use: No    Alcohol/week: 0.0 standard drinks    Comment: occasionaly  . Drug use: No  .  Sexual activity: Not Currently  Other Topics Concern  . Not on file  Social History Narrative  . Not on file   Social Determinants of Health   Financial Resource Strain:   . Difficulty of Paying Living Expenses: Not on file  Food Insecurity:   . Worried About Programme researcher, broadcasting/film/video in the Last Year: Not on file  . Ran Out of Food in the Last Year: Not on file  Transportation Needs:   . Lack of Transportation (Medical): Not on file  . Lack of Transportation (Non-Medical): Not on file  Physical Activity:   . Days of Exercise per Week: Not on file  . Minutes of Exercise per Session: Not on file  Stress:   . Feeling of Stress : Not on file  Social Connections:   . Frequency of Communication with Friends and Family: Not on file  . Frequency of Social Gatherings with Friends and Family: Not on file  . Attends Religious Services: Not on file  . Active Member of Clubs or Organizations: Not on file  . Attends Banker Meetings: Not on file  . Marital Status: Not on file     Family History:  The patient's family history includes Breast cancer in his mother; Diabetes in his maternal grandmother; Lung cancer in his father.  ROS:   ROS   EKGs/Labs/Other Studies Reviewed:    Studies reviewed were summarized above. The additional studies were reviewed today:  2D echo 08/2018: 1. The left ventricle has normal systolic function, with an ejection  fraction of 55-60%. The cavity size was normal. There is mild concentric  left ventricular hypertrophy. Left ventricular diastolic Doppler  parameters are consistent with impaired  relaxation.  2. The right ventricle has normal systolic function. The cavity was  normal. There is no increase in right ventricular wall thickness.  3. The mitral valve is normal in structure.  4. The tricuspid valve is normal in structure.  5. The aortic valve is tricuspid.  6. The pulmonic valve was normal in structure. __________  Nuclear  stress test 10/2017:  There was no ST segment deviation noted during stress.  The study is normal.  This is a low risk study.  The left ventricular ejection fraction is normal (55-65%). __________  2D echo 05/2015: - Left ventricle: The cavity size was normal. Systolic function was  normal. The estimated ejection fraction was in the range of 60%  to 65%. Wall motion was normal; there were no regional wall  motion abnormalities. Left ventricular diastolic function  parameters were normal.  - Left atrium: The atrium was normal in size.  - Right ventricle: Systolic function was normal.  - Pulmonary arteries: Systolic pressure was moderately elevated. PA  peak pressure: 56 mm Hg (S).  - Inferior vena cava: The vessel was dilated. The respirophasic  diameter  changes were blunted (< 50%), consistent with elevated  central venous pressure.   EKG:  EKG is ordered today.  The EKG ordered today demonstrates ***  Recent Labs: 04/25/2020: ALT 36; B Natriuretic Peptide 37.7; BUN 13; Creatinine, Ser 0.77; Hemoglobin 12.6; Platelets 206; Potassium 3.7; Sodium 137  Recent Lipid Panel    Component Value Date/Time   CHOL 145 09/11/2018 0527   TRIG 163 (H) 09/11/2018 0527   HDL 42 09/11/2018 0527   CHOLHDL 3.5 09/11/2018 0527   VLDL 33 09/11/2018 0527   LDLCALC 70 09/11/2018 0527    PHYSICAL EXAM:    VS:  LMP 03/13/2015 (Approximate) Comment: more than 2 years ago  BMI: There is no height or weight on file to calculate BMI.  Physical Exam  Wt Readings from Last 3 Encounters:  04/25/20 202 lb 4 oz (91.7 kg)  04/25/20 200 lb (90.7 kg)  03/26/20 200 lb (90.7 kg)     ASSESSMENT & PLAN:   1. ***  Disposition: F/u with Dr. Mariah Milling or an APP in ***.   Medication Adjustments/Labs and Tests Ordered: Current medicines are reviewed at length with the patient today.  Concerns regarding medicines are outlined above. Medication changes, Labs and Tests ordered today are  summarized above and listed in the Patient Instructions accessible in Encounters.   Signed, Eula Listen, PA-C 05/20/2020 4:16 PM     [facility] [address] [place], [place] [address] [phone]

## 2020-05-21 ENCOUNTER — Other Ambulatory Visit: Payer: Self-pay | Admitting: Cardiovascular Disease

## 2020-05-21 ENCOUNTER — Ambulatory Visit: Payer: Medicaid Other | Admitting: Physician Assistant

## 2020-05-22 ENCOUNTER — Encounter: Payer: Self-pay | Admitting: Physician Assistant

## 2020-06-08 ENCOUNTER — Emergency Department: Payer: Medicaid Other

## 2020-06-08 ENCOUNTER — Encounter: Payer: Self-pay | Admitting: Emergency Medicine

## 2020-06-08 ENCOUNTER — Other Ambulatory Visit: Payer: Self-pay

## 2020-06-08 ENCOUNTER — Emergency Department
Admission: EM | Admit: 2020-06-08 | Discharge: 2020-06-08 | Disposition: A | Discharge status: Home / Self Care | Payer: Medicaid Other | Attending: Emergency Medicine | Admitting: Emergency Medicine

## 2020-06-08 DIAGNOSIS — I5032 Chronic diastolic (congestive) heart failure: Secondary | ICD-10-CM | POA: Diagnosis not present

## 2020-06-08 DIAGNOSIS — W19XXXA Unspecified fall, initial encounter: Secondary | ICD-10-CM | POA: Insufficient documentation

## 2020-06-08 DIAGNOSIS — S8001XA Contusion of right knee, initial encounter: Secondary | ICD-10-CM | POA: Diagnosis not present

## 2020-06-08 DIAGNOSIS — Z79899 Other long term (current) drug therapy: Secondary | ICD-10-CM | POA: Diagnosis not present

## 2020-06-08 DIAGNOSIS — I110 Hypertensive heart disease with heart failure: Secondary | ICD-10-CM | POA: Insufficient documentation

## 2020-06-08 DIAGNOSIS — F17210 Nicotine dependence, cigarettes, uncomplicated: Secondary | ICD-10-CM | POA: Insufficient documentation

## 2020-06-08 DIAGNOSIS — Z7982 Long term (current) use of aspirin: Secondary | ICD-10-CM | POA: Diagnosis not present

## 2020-06-08 DIAGNOSIS — Z7951 Long term (current) use of inhaled steroids: Secondary | ICD-10-CM | POA: Diagnosis not present

## 2020-06-08 DIAGNOSIS — Y9389 Activity, other specified: Secondary | ICD-10-CM | POA: Diagnosis not present

## 2020-06-08 DIAGNOSIS — E119 Type 2 diabetes mellitus without complications: Secondary | ICD-10-CM | POA: Insufficient documentation

## 2020-06-08 DIAGNOSIS — Y92009 Unspecified place in unspecified non-institutional (private) residence as the place of occurrence of the external cause: Secondary | ICD-10-CM | POA: Diagnosis not present

## 2020-06-08 DIAGNOSIS — Z7984 Long term (current) use of oral hypoglycemic drugs: Secondary | ICD-10-CM | POA: Diagnosis not present

## 2020-06-08 DIAGNOSIS — J45909 Unspecified asthma, uncomplicated: Secondary | ICD-10-CM | POA: Diagnosis not present

## 2020-06-08 DIAGNOSIS — S8991XA Unspecified injury of right lower leg, initial encounter: Secondary | ICD-10-CM | POA: Diagnosis present

## 2020-06-08 MED ORDER — OXYCODONE-ACETAMINOPHEN 5-325 MG PO TABS
1.0000 | ORAL_TABLET | Freq: Once | ORAL | Status: AC
  Administered 2020-06-08: 1 via ORAL
  Filled 2020-06-08: qty 1

## 2020-06-08 NOTE — ED Provider Notes (Signed)
[facility] Emergency Department Provider Note  ____________________________________________   First MD Initiated Contact with Patient 06/08/20 1818     (approximate)  I have reviewed the triage vital signs and the nursing notes.   HISTORY  Chief Complaint Knee Pain    HPI Erica [name] is a 54 y.o. 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  States that she was blowing leaves and fell onto her right knee.  States it is very painful to bear weight has a lot of bruising and swelling.  No numbness or tingling.  No other injuries reported    Past Medical History:  Diagnosis Date  . (HFpEF) heart failure with preserved ejection fraction ([place])    a. 05/2015 Echo: EF 60-65%, no rwma, PASP 77mHg.  .Marland KitchenAcute pancreatitis 08/13/2018  . Arthritis   . Asthma   . Bell's palsy   . Bronchitis   . CHF (congestive heart failure) ([place]   . COPD (chronic obstructive pulmonary disease) ([place]   . Diabetes mellitus without complication ([place]    type II 03/2017  . Gastric ulcer   . Hyperlipidemia   . Hypertension   . OSA (obstructive sleep apnea)    a. did not tolerate CPAP.  .Marland KitchenPancreatitis    07/2018  . Shoulder injury    6/19    Patient Active Problem List   Diagnosis Date Noted  . Uncomplicated opioid dependence ([place] 03/31/2020  . Tricompartmental disease of knee 02/11/2020  . Pharmacologic therapy 11/13/2019  . Chronic low back pain (Bilateral) w/ sciatica (Bilateral) 08/20/2019  . PTSD (post-traumatic stress disorder) 02/20/2019  . Chronic musculoskeletal pain 02/20/2019  . DDD (degenerative disc disease), lumbosacral 09/19/2018  . Osteoarthritis of facet joint of lumbar spine 09/19/2018  . Hypokalemia 09/10/2018  . Nausea vomiting and diarrhea 08/13/2018  . Transaminitis 08/13/2018  . Elevated LFTs 08/13/2018  . Peptic ulcer disease 08/13/2018  . COPD (chronic obstructive pulmonary disease) ([place] 08/13/2018  . Obesity (BMI  30.0-34.9) 12/05/2017  . Primary localized osteoarthrosis, pelvic region and thigh 12/05/2017  . Arthropathy of left hip 11/08/2017  . Rib pain on right side 11/08/2017  . Spondylosis without myelopathy or radiculopathy, lumbosacral region 10/06/2017  . Vertigo 09/15/2017  . Diabetes ([place] 07/18/2017  . HTN (hypertension) 07/18/2017  . Chronic hip pain (Secondary area of Pain) (Bilateral) (L>R) 05/02/2017  . 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 Hemarthrosis, left knee 11/10/2016  . Osteoarthritis of knee (Bilateral) (L>R) 09/08/2016  . Intermittent left thoracic Muscle cramps 09/08/2016  . Spasm of thoracolumbar muscle (Left) 09/08/2016  . Post-traumatic osteoarthritis of knee (Left) 09/08/2016  . Lumbar spondylosis 07/05/2016  . Chronic pain syndrome 06/28/2016  . Elevated sedimentation rate 06/28/2016  . Elevated C-reactive protein (CRP) 06/28/2016  . Neurogenic pain 06/28/2016  . Vitamin D deficiency 06/28/2016  . Long term current use of opiate analgesic 04/29/2016  . Long term prescription opiate use 04/29/2016  . Opiate use 04/29/2016  . Chronic low back pain (Primary Area of Pain) (Bilateral) (L>R) 04/29/2016  . Chronic knee pain (Third area of Pain) (Bilateral) (L>R) 04/29/2016  . Chronic neck pain (Bilateral) (R>L) 04/29/2016  . Chronic upper back pain (midline) 04/29/2016  . Chronic foot pain (bottom of feet) (Bilateral) (R>L) 04/29/2016  . Chronic hand pain (Bilateral) 04/29/2016  . Peripheral neuropathy, idiopathic (upper and lower extremity) 04/29/2016  . Chronic sacroiliac joint pain (Bilateral) (R>L) 04/29/2016  . Lumbar facet syndrome (Bilateral) (R>L)  04/29/2016  . Depression 03/05/2016  . Chronic diastolic heart failure ([place]) 07/16/2015  . Tobacco use disorder 07/16/2015  . Obstructive sleep apnea 07/16/2015  . Tachycardia 07/16/2015  . COPD, mild ([place])   . Renal insufficiency   . Pulmonary HTN ([place])   . Chest pain 06/14/2015  .  Hyponatremia 06/14/2015  . Shortness of breath   . Swelling   . Cough   . COPD (chronic obstructive pulmonary disease) with acute bronchitis ([place])     Past Surgical History:  Procedure Laterality Date  . FLEXIBLE BRONCHOSCOPY N/A 06/20/2015   Procedure: FLEXIBLE BRONCHOSCOPY;  Surgeon: Laverle Hobby, MD;  Location: [facility] ORS;  Service: Pulmonary;  Laterality: N/A;  . KNEE SURGERY Left    8 knee surgeries    Prior to Admission medications   Medication Sig Start Date End Date Taking? Authorizing Provider  albuterol (PROAIR HFA) 108 (90 Base) MCG/ACT inhaler Inhale 2 puffs into the lungs every 6 (six) hours as needed for wheezing or shortness of breath.     [provider]  aspirin EC 81 MG EC tablet Take 1 tablet (81 mg total) by mouth daily. 06/23/15   Demetrios Loll, MD  atorvastatin (LIPITOR) 80 MG tablet Take 1 tablet (80 mg total) by mouth daily. 10/31/18   Minna Merritts, MD  Blood Glucose Monitoring Suppl (ACCU-CHEK AVIVA PLUS) w/Device KIT See admin instructions. 10/20/17   [provider]  busPIRone (BUSPAR) 10 MG tablet Take 20 mg by mouth 2 (two) times daily. 02/14/20   [provider]  Cholecalciferol (VITAMIN D3) 50 MCG (2000 UT) capsule Take 1 capsule (2,000 Units total) by mouth daily. Take 1 capsule (2,000 Units total) by mouth daily. 05/23/19 05/22/20  Milinda Pointer, MD  clonazePAM (KLONOPIN) 0.5 MG tablet Take 0.25 mg by mouth at bedtime as needed. 03/12/20   [provider]  diltiazem (CARDIZEM CD) 120 MG 24 hr capsule TAKE 1 CAPSULE BY MOUTH EVERY DAY 05/19/20   Gollan, Kathlene November, MD  diltiazem (CARDIZEM) 30 MG tablet TAKE 1 TABLET(30 MG) BY MOUTH THREE TIMES DAILY AS NEEDED 05/21/20   Minna Merritts, MD  DULoxetine (CYMBALTA) 60 MG capsule Take 60 mg by mouth 2 (two) times daily.     [provider]  Fluticasone-Salmeterol (ADVAIR) 250-50 MCG/DOSE AEPB Inhale 1 puff into the lungs 2 (two) times daily.    [provider]  furosemide (LASIX) 40 MG tablet Take 1 tablet (40 mg total) by mouth daily. Take an additional 40 mg in the am for weight gain as needed 10/31/18   Minna Merritts, MD  Magnesium 500 MG CAPS once daily. 11/08/16   [provider]  metFORMIN (GLUCOPHAGE) 500 MG tablet Take 1 tablet (500 mg total) by mouth 2 (two) times daily with a meal. 05/04/18   Gregor Hams, MD  metFORMIN (GLUCOPHAGE-XR) 500 MG 24 hr tablet Take 500 mg by mouth 2 (two) times daily. 04/19/20   [provider]  morphine (MS CONTIN) 15 MG 12 hr tablet Take 1 tablet (15 mg total) by mouth every 12 (twelve) hours. Must last 30 days. Do not break tablet 03/26/20 04/25/20  Milinda Pointer, MD  morphine (MS CONTIN) 15 MG 12 hr tablet Take 1 tablet (15 mg total) by mouth every 12 (twelve) hours. Must last 30 days. Do not break tablet 04/25/20 05/25/20  Milinda Pointer, MD  morphine (MS CONTIN) 15 MG 12 hr tablet Take 1 tablet (15 mg total) by mouth every 12 (twelve)  hours. Must last 30 days. Do not break tablet 05/25/20 06/24/20  Milinda Pointer, MD  OXYGEN Inhale 2 L into the lungs at bedtime.     [provider]  pantoprazole (PROTONIX) 40 MG tablet Take 1 tablet (40 mg total) by mouth daily. 05/04/18   Gregor Hams, MD  potassium chloride SA (KLOR-CON) 20 MEQ tablet Take 20 mEq by mouth 2 (two) times daily. 02/15/20   [provider]  pregabalin (LYRICA) 150 MG capsule Take 1 capsule (150 mg total) by mouth 3 (three) times daily. 02/11/20 08/09/20  Milinda Pointer, MD  spironolactone (ALDACTONE) 25 MG tablet Take 25 mg by mouth every morning. 02/14/20   [provider]  sucralfate (CARAFATE) 1 G tablet Take 1 tablet (1 g total) by mouth 4 (four) times daily -  with meals and at bedtime. Patient taking differently: Take 1 g by mouth 2 (two) times daily.  06/23/15   Demetrios Loll, MD  tiotropium (SPIRIVA HANDIHALER) 18 MCG inhalation capsule Place 18 mcg into inhaler and  inhale daily.    [provider]  tiZANidine (ZANAFLEX) 4 MG capsule Take 1 capsule (4 mg total) by mouth 3 (three) times daily as needed for muscle spasms. 02/16/20 08/14/20  Milinda Pointer, MD  tiZANidine (ZANAFLEX) 4 MG tablet Take 4 mg by mouth 3 (three) times daily as needed. 03/25/20   [provider]    Allergies Tramadol and Penicillins  Family History  Problem Relation Age of Onset  . Lung cancer Father   . Breast cancer Mother        early 94's  . Diabetes Maternal Grandmother     Social History Social History   Tobacco Use  . Smoking status: Current Every Day Smoker    Packs/day: 1.00    Years: 36.00    Pack years: 36.00    Types: Cigarettes    Last attempt to quit: 06/13/2015    Years since quitting: 4.9  . Smokeless tobacco: Never Used  Vaping Use  . Vaping Use: Never used  Substance Use Topics  . Alcohol use: No    Alcohol/week: 0.0 standard drinks    Comment: occasionaly  . Drug use: No    Review of Systems  Constitutional: No fever/chills Eyes: No visual changes. ENT: No sore throat. Respiratory: Denies cough Cardiovascular: Denies chest pain Genitourinary: Negative for dysuria. Musculoskeletal: Negative for back pain.  Positive right knee pain Skin: Negative for rash. Psychiatric: no mood changes,     ____________________________________________   PHYSICAL EXAM:  VITAL SIGNS: ED Triage Vitals  Enc Vitals Group     BP 06/08/20 1812 (!) 141/70     Pulse Rate 06/08/20 1812 (!) 106     Resp 06/08/20 1812 18     Temp 06/08/20 1812 98.5 F (36.9 C)     Temp Source 06/08/20 1812 Oral     SpO2 06/08/20 1812 96 %     Weight 06/08/20 1813 200 lb (90.7 kg)     Height 06/08/20 1813 5' 8"  (1.727 m)     Head Circumference --      Peak Flow --      Pain Score 06/08/20 1813 7     Pain Loc --      Pain Edu? --      Excl. in [place]? --     Constitutional: Alert and oriented. Well appearing and in no acute distress. Eyes:  Conjunctivae are normal.  Head: Atraumatic. Nose: No congestion/rhinnorhea. Mouth/Throat: Mucous membranes are  moist.   Neck:  supple no lymphadenopathy noted Cardiovascular: Normal rate, regular rhythm. Respiratory: Normal respiratory effort.  No retractions, GU: deferred Musculoskeletal: Decreased range of motion of the right knee secondary discomfort, large bruising noted at the patella, joint line is mildly tender, neurovascular is intact  neurologic:  Normal speech and language.  Skin:  Skin is warm, dry and intact. No rash noted. Psychiatric: Mood and affect are normal. Speech and behavior are normal.  ____________________________________________   LABS (all labs ordered are listed, but only abnormal results are displayed)  Labs Reviewed - No data to display ____________________________________________   ____________________________________________  RADIOLOGY  X-ray of the right knee shows a tumor and soft tissue swelling  ____________________________________________   PROCEDURES  Procedure(s) performed: Knee immobilizer   Procedures    ____________________________________________   INITIAL IMPRESSION / ASSESSMENT AND PLAN / ED COURSE  Pertinent labs & imaging results that were available during my care of the patient were reviewed by me and considered in my medical decision making (see chart for details).   Patient is 54 year old female presents emergency department after falling onto her right knee.  See HPI.  Physical exam is consistent with same.    X-ray of the right knee shows soft tissue swelling, no fracture.  Radiologist read also states that there is a tumor which the patient is already aware of and states is benign.  Therefore there is no further imaging needed.  I did review the x-rays myself also.   Everything to the patient.  She was placed in the immobilizer and given ice pack.  1 Percocet here in the ED if she is on a pain contract.  She is to  follow-up with orthopedics.  She is already established with Dr. Marry Guan so advised her to follow-up with him.  Return emergency department worsening.  She was discharged in stable condition.     Erica [name] was evaluated in Emergency Department on 06/08/2020 for the symptoms described in the history of present illness. He was evaluated in the context of the global COVID-19 pandemic, which necessitated consideration that the patient might be at risk for infection with the SARS-CoV-2 virus that causes COVID-19. Institutional protocols and algorithms that pertain to the evaluation of patients at risk for COVID-19 are in a state of rapid change based on information released by regulatory bodies including the CDC and federal and state organizations. These policies and algorithms were followed during the patient's care in the ED.    As part of my medical decision making, I reviewed the following data within the [place] notes reviewed and incorporated, Old chart reviewed, Radiograph reviewed , Notes from prior ED visits and [place] Controlled Substance Database  ____________________________________________   FINAL CLINICAL IMPRESSION(S) / ED DIAGNOSES  Final diagnoses:  Contusion of right knee, initial encounter      NEW MEDICATIONS STARTED DURING THIS VISIT:  New Prescriptions   No medications on file     Note:  This document was prepared using Dragon voice recognition software and may include unintentional dictation errors.    Versie Starks, PA-C 06/08/20 1904    Harvest Dark, MD 06/08/20 2127

## 2020-06-08 NOTE — Discharge Instructions (Addendum)
Follow-up with [facility] clinic orthopedics.  Apply ice to the knee.  Wear the knee immobilizer for comfort.  Take your regular medications.  Return if worsening.

## 2020-06-08 NOTE — ED Triage Notes (Addendum)
Pt to ED via POV. Pt states she was blowing leaves and fell on her right knee. Swelling noted to knee and pain worse on the right lateral side.

## 2020-06-22 NOTE — Progress Notes (Deleted)
The patient canceled her appointment indicating that she has spent the entire night in the ED.

## 2020-06-23 ENCOUNTER — Other Ambulatory Visit: Payer: Self-pay | Admitting: Pain Medicine

## 2020-06-23 ENCOUNTER — Encounter: Payer: Medicaid Other | Admitting: Pain Medicine

## 2020-06-23 DIAGNOSIS — G8929 Other chronic pain: Secondary | ICD-10-CM

## 2020-06-23 DIAGNOSIS — G894 Chronic pain syndrome: Secondary | ICD-10-CM

## 2020-06-23 DIAGNOSIS — M792 Neuralgia and neuritis, unspecified: Secondary | ICD-10-CM

## 2020-06-23 DIAGNOSIS — E559 Vitamin D deficiency, unspecified: Secondary | ICD-10-CM

## 2020-06-23 DIAGNOSIS — M1732 Unilateral post-traumatic osteoarthritis, left knee: Secondary | ICD-10-CM

## 2020-06-23 DIAGNOSIS — M62830 Muscle spasm of back: Secondary | ICD-10-CM

## 2020-06-23 DIAGNOSIS — R252 Cramp and spasm: Secondary | ICD-10-CM

## 2020-06-23 DIAGNOSIS — F1120 Opioid dependence, uncomplicated: Secondary | ICD-10-CM

## 2020-06-23 DIAGNOSIS — G609 Hereditary and idiopathic neuropathy, unspecified: Secondary | ICD-10-CM

## 2020-06-23 DIAGNOSIS — Z79899 Other long term (current) drug therapy: Secondary | ICD-10-CM

## 2020-06-23 MED ORDER — PREGABALIN 150 MG PO CAPS: 150.0000 mg | ORAL_CAPSULE | Freq: Three times a day (TID) | ORAL | 2 refills | Status: DC

## 2020-06-23 MED ORDER — TIZANIDINE HCL 4 MG PO CAPS: 4.0000 mg | ORAL_CAPSULE | Freq: Three times a day (TID) | ORAL | 2 refills | Status: DC | PRN

## 2020-06-23 MED ORDER — VITAMIN D3 50 MCG (2000 UT) PO CAPS: 2000.0000 [IU] | ORAL_CAPSULE | Freq: Every day | ORAL | 2 refills | Status: DC

## 2020-06-25 ENCOUNTER — Encounter: Payer: Medicaid Other | Admitting: Pain Medicine

## 2020-06-25 NOTE — Progress Notes (Deleted)
No show

## 2020-06-29 NOTE — Progress Notes (Signed)
PROVIDER NOTE: Information contained herein reflects review and annotations entered in association with encounter. Interpretation of such information and data should be left to medically-trained personnel. Information provided to patient can be located elsewhere in the medical record under "Patient Instructions". Document created using STT-dictation technology, any transcriptional errors that may result from process are unintentional.    Patient: Erica [name]  Service Category: E/M  Provider: Gaspar Cola, MD  DOB: 01-22-1966  DOS: 07/02/2020  Specialty: Interventional Pain Management  MRN: [id number]  Setting: Ambulatory outpatient  PCP: Erica Lank, MD  Type: Established Patient    Referring Provider: Denton Lank, MD  Location: Office  Delivery: Face-to-face     HPI  Erica [name], a 54 y.o. year old adult, is here today because of [name]. Erica [name] primary complain today is Back Pain and Hip Pain (Bilateral/) Last encounter: My last encounter with him was on 06/25/2020. Pertinent problems: Erica [name] has Chronic low back pain (1ry area of Pain) (Bilateral) (L>R); Chronic knee pain (3ry area of Pain) (Bilateral) (L>R); Chronic neck pain (Bilateral) (R>L); Chronic upper back pain (midline); Chronic foot pain (bottom of feet) (Bilateral) (R>L); Chronic hand pain (Bilateral); Peripheral neuropathy, idiopathic (upper and lower extremity); Chronic sacroiliac joint pain (Bilateral) (R>L); Lumbar facet syndrome (Bilateral) (R>L); Chronic pain syndrome; Neurogenic pain; Lumbar spondylosis; Osteoarthritis of knee (Bilateral) (L>R); Intermittent left thoracic Muscle cramps; Spasm of thoracolumbar muscle (Left); Post-traumatic osteoarthritis of knee (Left); Hemarthrosis, left knee; 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); Chronic hip pain (2ry area of Pain) (Bilateral) (L>R); Spondylosis without myelopathy or radiculopathy, lumbosacral region; Arthropathy of left hip; Rib  pain on right side; Primary localized osteoarthrosis, pelvic region and thigh; DDD (degenerative disc disease), lumbosacral; Osteoarthritis of facet joint of lumbar spine; Chronic musculoskeletal pain; Chronic low back pain (Bilateral) w/ sciatica (Bilateral); Tricompartmental disease of knee; Cervicalgia; Numbness and tingling of upper extremity (Left); DDD (degenerative disc disease), cervical; and Cervical radiculopathy (sensory) (Left) on their pertinent problem list. Pain Assessment: Severity of Chronic pain is reported as a  /10. Location: Back Lower/left leg to the knee. Onset: More than a month ago. Quality: Throbbing,Sharp. Timing: Constant. Modifying factor(s): medications, heat. Vitals:  height is _0  (1.727 m) and weight is 200 lb (90.7 kg). His temporal temperature is 97 F (36.1 C) (abnormal). His respiration is 16 and oxygen saturation is 97%.   Reason for encounter: medication management.  The patient indicates doing well with the current medication regimen. No adverse reactions or side effects reported to the medications. PMP & UDS compliant.  In fact, the patient indicates that this medicine is working a lot better than the oxycodone.  In view of this, we will continue the medicine as is.  We transferred the patient's Zanaflex, Lyrica, and vitamin D3 to her primary care provider and she has already talked to her to make sure that they were okay with it.  The patient confirmed having received my letter.  Today the patient asked about left upper extremity numbness and pain that she occasionally gets through that area.  This seems to be a radicular type of pain however, she was unable to give me specifics as to which fingers are the ones that are affected.  Today I had the patient move her cervical spine through normal range of motion to see if by any chance any of that would trigger the pain or worsen the numbness, however she indicated that it did not seem to change that  much.  In 2017 we  had done a cervical x-ray and today we will go ahead and order a follow-up to evaluate changes.  She is still having some pain in the area of the neck and this upper extremity numbness does have a radicular component to it.  On 03/26/2020 I provided the patient with 3 prescriptions for morphine ER 15 mg twice daily to last until 06/24/2020.  She had her last 1 filled on 05/28/2020 which means that it should have lasted until 06/27/2020.  RTCB: 09/30/2020 Nonopioids transferred 06/23/2020: Zanaflex, Lyrica, and vitamin D3  Pharmacotherapy Assessment   Analgesic: Oxycodone IR 5 mg, 1 tab PO q 8 hrs (56m/dayof oxycodone) MME/day:22.515mday.   Monitoring: [place] PMP: PDMP reviewed during this encounter.       Pharmacotherapy: No side-effects or adverse reactions reported. Compliance: No problems identified. Effectiveness: Clinically acceptable.  WhLandis MartinsRN  07/02/2020  2:27 PM  Sign when Signing Visit Nursing Pain Medication Assessment:  Safety precautions to be maintained throughout the outpatient stay will include: orient to surroundings, keep bed in low position, maintain call bell within reach at all times, provide assistance with transfer out of bed and ambulation.  Medication Inspection Compliance: Erica [name] not comply with our request to bring his pills to be counted. He was reminded that bringing the medication bottles, even when empty, is a requirement.  Medication: None brought in. Pill/Patch Count: None available to be counted. Bottle Appearance: No container available. Did not bring bottle(s) to appointment. Filled Date: N/A Last Medication intake:  Today    UDS:  Summary  Date Value Ref Range Status  02/12/2020 Note  Final    Comment:    ==================================================================== [employer] [facility] 13 (MW) ==================================================================== Test                             Result       Flag        Units  Drug Present   Alprazolam                     168                     ng/mg creat   Alpha-hydroxyalprazolam        155                     ng/mg creat    Source of alprazolam is a scheduled prescription medication. Alpha-    hydroxyalprazolam is an expected metabolite of alprazolam.    Oxycodone                      2488                    ng/mg creat   Oxymorphone                    1555                    ng/mg creat   Noroxycodone                   992                     ng/mg creat   Noroxymorphone  236                     ng/mg creat    Sources of oxycodone are scheduled prescription medications.    Oxymorphone, noroxycodone, and noroxymorphone are expected    metabolites of oxycodone. Oxymorphone is also available as a    scheduled prescription medication.  ==================================================================== Test                      Result    Flag   Units      Ref Range   Creatinine              242              mg/dL      >=20 ==================================================================== Declared Medications:  Medication list was not provided. ==================================================================== For clinical consultation, please call [phone]. ====================================================================      ROS  Constitutional: Denies any fever or chills Gastrointestinal: No reported hemesis, hematochezia, vomiting, or acute GI distress Musculoskeletal: Denies any acute onset joint swelling, redness, loss of ROM, or weakness Neurological: No reported episodes of acute onset apraxia, aphasia, dysarthria, agnosia, amnesia, paralysis, loss of coordination, or loss of consciousness  Medication Review  Accu-Chek Aviva Plus, DULoxetine, Fluticasone-Salmeterol, Magnesium, Oxygen-Helium, Vitamin D3, albuterol, aspirin, atorvastatin, busPIRone, clonazePAM, diltiazem, furosemide, metFORMIN, morphine,  pantoprazole, potassium chloride SA, pregabalin, spironolactone, sucralfate, tiZANidine, and tiotropium  History Review  Allergy: Erica [name] is allergic to tramadol and penicillins. Drug: Erica [name]  reports no history of drug use. Alcohol:  reports no history of alcohol use. Tobacco:  reports that he has been smoking cigarettes. He has a 36.00 pack-year smoking history. He has never used smokeless tobacco. Social: Mr. Guimaraes  reports that he has been smoking cigarettes. He has a 36.00 pack-year smoking history. He has never used smokeless tobacco. He reports that he does not drink alcohol and does not use drugs. Medical:  has a past medical history of (HFpEF) heart failure with preserved ejection fraction ([place]), Acute pancreatitis (08/13/2018), Arthritis, Asthma, Bell's palsy, Bronchitis, CHF (congestive heart failure) ([place]), COPD (chronic obstructive pulmonary disease) ([place]), Diabetes mellitus without complication ([place]), Gastric ulcer, Hyperlipidemia, Hypertension, OSA (obstructive sleep apnea), Pancreatitis, and Shoulder injury. Surgical: Mr. Straw  has a past surgical history that includes Knee surgery (Left) and Flexible bronchoscopy (N/A, 06/20/2015). Family: family history includes Breast cancer in his mother; Diabetes in his maternal grandmother; Lung cancer in his father.  Laboratory Chemistry Profile   Renal Lab Results  Component Value Date   BUN 13 04/25/2020   CREATININE 0.77 04/25/2020   LABCREA 138 06/14/2015   BCR 15 04/22/2016   GFRAA >60 09/12/2018   GFRNONAA >60 04/25/2020     Hepatic Lab Results  Component Value Date   AST 37 04/25/2020   ALT 36 04/25/2020   ALBUMIN 3.9 04/25/2020   ALKPHOS 72 04/25/2020   HCVAB <0.1 08/12/2018   LIPASE 38 09/10/2018     Electrolytes Lab Results  Component Value Date   NA 137 04/25/2020   K 3.7 04/25/2020   CL 99 04/25/2020   CALCIUM 9.4 04/25/2020   MG 2.0 09/12/2018   PHOS 1.1 (L) 09/12/2018     Bone Lab Results   Component Value Date   25OHVITD1 15 (L) 04/30/2016   25OHVITD2 <1.0 04/30/2016   25OHVITD3 15 04/30/2016     Inflammation (CRP: Acute Phase) (ESR: Chronic Phase) Lab Results  Component Value Date   CRP 1.3 (H) 04/30/2016  ESRSEDRATE 31 (H) 04/30/2016       Note: Above Lab results reviewed.  Recent Imaging Review  DG Knee Complete 4 Views Right CLINICAL DATA:  Pain and swelling  EXAM: RIGHT KNEE - COMPLETE 4+ VIEW  COMPARISON:  11/25/2015, MRI 11/11/2008  FINDINGS: Minimal sclerosis in the fibular head as before. No acute displaced fracture or malalignment is seen. Moderate swelling anterior to the patella. Trace knee effusion. Apparent mild osseous remodeling at the lateral femoral condyle with adjacent soft tissue prominence. Moderate narrowing of the lateral joint space.  IMPRESSION: 1. No acute osseous abnormality. 2. Moderate swelling anterior to the patella. 3. Apparent mild osseous remodeling at the lateral femoral condyle with adjacent soft tissue prominence, question mass in the region. Correlate with physical exam, further evaluation with MRI could be obtained for further evaluation. This may be performed non emergently unless clinical symptoms dictate otherwise. 4. Trace knee effusion.  Electronically Signed   By: Donavan Foil M.D.   On: 06/08/2020 18:40 Note: Reviewed        Physical Exam  General appearance: Well nourished, well developed, and well hydrated. In no apparent acute distress Mental status: Alert, oriented x 3 (person, place, & time)       Respiratory: No evidence of acute respiratory distress Eyes: PERLA Vitals: Temp (!) 97 F (36.1 C) (Temporal)   Resp 16   Ht _0  (1.727 m)   Wt 200 lb (90.7 kg)   LMP 03/13/2015 (Approximate) Comment: more than 2 years ago  SpO2 97%   BMI 30.41 kg/m  BMI: Estimated body mass index is 30.41 kg/m as calculated from the following:   Height as of this encounter: _1  (1.727 m).   Weight as of  this encounter: 200 lb (90.7 kg). Ideal: Ideal body weight: 63.9 kg (140 lb 14 oz) Adjusted ideal body weight: 74.6 kg (164 lb 8.4 oz)  Assessment   Status Diagnosis  Controlled Controlled Controlled 1. Chronic pain syndrome   2. Chronic neck pain (Bilateral) (R>L)   3. Numbness and tingling of upper extremity (Left)   4. Cervical radiculopathy (sensory) (Left)   5. DDD (degenerative disc disease), cervical   6. Cervicalgia   7. Chronic low back pain (1ry area of Pain) (Bilateral) (L>R)   8. Chronic hip pain (2ry area of Pain) (Bilateral) (L>R)   9. Chronic knee pain (3ry area of Pain) (Bilateral) (L>R)   10. Pharmacologic therapy   11. Uncomplicated opioid dependence ([place])      Updated Problems: Problem  Cervicalgia  Numbness and tingling of upper extremity (Left)  Ddd (Degenerative Disc Disease), Cervical  Cervical radiculopathy (sensory) (Left)  Chronic hip pain (2ry area of Pain) (Bilateral) (L>R)  Chronic low back pain (1ry area of Pain) (Bilateral) (L>R)  Chronic knee pain (3ry area of Pain) (Bilateral) (L>R)    Plan of Care  Problem-specific:  No problem-specific Assessment & Plan notes found for this encounter.  Erica [name] has a current medication list which includes the following long-term medication(s): atorvastatin, vitamin d3, clonazepam, diltiazem, diltiazem, duloxetine, fluticasone-salmeterol, furosemide, metformin, morphine, [START ON 08/01/2020] morphine, [START ON 08/31/2020] morphine, pantoprazole, pregabalin, spironolactone, sucralfate, tiotropium, and tizanidine.  Pharmacotherapy (Medications Ordered): Meds ordered this encounter  Medications  . morphine (MS CONTIN) 15 MG 12 hr tablet    Sig: Take 1 tablet (15 mg total) by mouth every 12 (twelve) hours. Must last 30 days. Do not break tablet    Dispense:  60 tablet  Refill:  0    Chronic Pain: STOP Act (Not applicable) Fill 1 day early if closed on refill date. Avoid benzodiazepines within 8  hours of opioids  . morphine (MS CONTIN) 15 MG 12 hr tablet    Sig: Take 1 tablet (15 mg total) by mouth every 12 (twelve) hours. Must last 30 days. Do not break tablet    Dispense:  60 tablet    Refill:  0    Chronic Pain: STOP Act (Not applicable) Fill 1 day early if closed on refill date. Avoid benzodiazepines within 8 hours of opioids  . morphine (MS CONTIN) 15 MG 12 hr tablet    Sig: Take 1 tablet (15 mg total) by mouth every 12 (twelve) hours. Must last 30 days. Do not break tablet    Dispense:  60 tablet    Refill:  0    Chronic Pain: STOP Act (Not applicable) Fill 1 day early if closed on refill date. Avoid benzodiazepines within 8 hours of opioids   Orders:  Orders Placed This Encounter  Procedures  . DG Cervical Spine With Flex & Extend    Patient presents with axial pain with possible radicular component.  Please evaluate for any evidence of cervical spine instability. Describe the presence of any spondylolisthesis (Antero- or retrolisthesis). If present, provide displacement "Grade" and measurement in cm. Please describe presence and specific location (Level & Laterality) of any signs of  osteoarthritis, zygapophyseal (Facet) joints DJD (including decreased joint space and/or osteophytosis), DDD, Foraminal narrowing, as well as any sclerosis and/or cyst formation. Please comment on ROM. In addition to any acute findings, please report on:  1. Facet (Zygapophyseal) joint DJD (Hypertrophy, space narrowing, subchondral sclerosis, and/or osteophyte formation) 2. DDD and/or IVDD (Loss of disc height, desiccation or "Black disc disease") 3. Pars defects 4. Spondylolisthesis, spondylosis, and/or spondyloarthropathies (include Degree/Grade of displacement in mm) 5. Vertebral body Fractures, including age (old, new/acute) 75. Modic Type Changes 7. Demineralization 8. Bone pathology 9. Central, Lateral Recess, and/or Foraminal Stenosis (include AP diameter of stenosis in mm) 10.  Surgical changes (hardware type, status, and presence of fibrosis) NOTE: Please specify level(s) and laterality. If applicable: Please indicate ROM and/or evidence of instability (>51m displacement between flexion and extension views)    Standing Status:   Future    Standing Expiration Date:   08/02/2020    Scheduling Instructions:     Imaging must be done as soon as possible. Inform patient that order will expire within 30 days and I will not renew it.    Order Specific Question:   Reason for Exam (SYMPTOM  OR DIAGNOSIS REQUIRED)    Answer:   Cervicalgia    Order Specific Question:   Is patient pregnant?    Answer:   No    Order Specific Question:   Preferred imaging location?    Answer:   [facility]    Order Specific Question:   Call Results- Best Contact Number?    Answer:   [phone][facility]    Order Specific Question:   Radiology Contrast Protocol - do NOT remove file path    Answer:   [email or web address]    Order Specific Question:   Release to patient    Answer:   Immediate   Follow-up plan:   Return in about 3 months (around 09/30/2020) for (F2F), (Med Mgmt).      Interventional treatment options: Under consideration:   Therapeutic right lumbar facet RFA#1 Diagnostic bilateral SI joint block Diagnostic  right IA hip joint injection Diagnostic bilateral genicular NB Possible bilateral genicular nerve RFA  Diagnostic left LESI  Diagnostic right CESI  Diagnostic bilateral cervical facet block  Diagnostic bilateral GONB  Possible greater occipital nerve RFA    Therapeutic/palliative (PRN):   Palliative left lumbar facet RFA #2 (last one done on 06/28/2019) Palliative left IA hip joint injection #2  Palliative bilateral lumbar facet block #5  Palliative bilateral IA Hyalgan knee injection #S2/N1 (last done on 11/08/2017)      Recent Visits No visits were found meeting these conditions. Showing recent visits  within past 90 days and meeting all other requirements Today's Visits Date Type Provider Dept  07/02/20 Office Visit Milinda Pointer, MD [facility]  Showing today's visits and meeting all other requirements Future Appointments Date Type Provider Dept  09/29/20 Appointment Milinda Pointer, MD [facility]  Showing future appointments within next 90 days and meeting all other requirements  I discussed the assessment and treatment plan with the patient. The patient was provided an opportunity to ask questions and all were answered. The patient agreed with the plan and demonstrated an understanding of the instructions.  Patient advised to call back or seek an in-person evaluation if the symptoms or condition worsens.  Duration of encounter: 30 minutes.  Note by: Erica Cola, MD Date: 07/02/2020; Time: 2:43 PM

## 2020-06-30 ENCOUNTER — Other Ambulatory Visit: Payer: Self-pay | Admitting: Cardiovascular Disease

## 2020-06-30 NOTE — Telephone Encounter (Signed)
Rx request sent to pharmacy.  

## 2020-07-02 ENCOUNTER — Ambulatory Visit
Admission: RE | Admit: 2020-07-02 | Discharge: 2020-07-02 | Disposition: A | Discharge status: Home / Self Care | Payer: Medicaid Other | Source: Ambulatory Visit | Attending: Pain Medicine | Admitting: Pain Medicine

## 2020-07-02 ENCOUNTER — Other Ambulatory Visit: Payer: Self-pay

## 2020-07-02 ENCOUNTER — Ambulatory Visit: Payer: Medicaid Other | Admitting: Pain Medicine

## 2020-07-02 ENCOUNTER — Encounter: Payer: Self-pay | Admitting: Pain Medicine

## 2020-07-02 ENCOUNTER — Telehealth: Payer: Self-pay

## 2020-07-02 VITALS — Temp 97.0°F | Resp 16 | Ht 68.0 in | Wt 200.0 lb

## 2020-07-02 DIAGNOSIS — M542 Cervicalgia: Secondary | ICD-10-CM | POA: Insufficient documentation

## 2020-07-02 DIAGNOSIS — M545 Low back pain, unspecified: Secondary | ICD-10-CM | POA: Insufficient documentation

## 2020-07-02 DIAGNOSIS — M5412 Radiculopathy, cervical region: Secondary | ICD-10-CM

## 2020-07-02 DIAGNOSIS — F1120 Opioid dependence, uncomplicated: Secondary | ICD-10-CM

## 2020-07-02 DIAGNOSIS — R202 Paresthesia of skin: Secondary | ICD-10-CM | POA: Insufficient documentation

## 2020-07-02 DIAGNOSIS — R200 Anesthesia of skin: Secondary | ICD-10-CM | POA: Insufficient documentation

## 2020-07-02 DIAGNOSIS — G8929 Other chronic pain: Secondary | ICD-10-CM | POA: Insufficient documentation

## 2020-07-02 DIAGNOSIS — M5030 Other cervical disc degeneration, unspecified cervical region: Secondary | ICD-10-CM | POA: Insufficient documentation

## 2020-07-02 DIAGNOSIS — M25552 Pain in left hip: Secondary | ICD-10-CM

## 2020-07-02 DIAGNOSIS — Z79899 Other long term (current) drug therapy: Secondary | ICD-10-CM

## 2020-07-02 DIAGNOSIS — G894 Chronic pain syndrome: Secondary | ICD-10-CM

## 2020-07-02 DIAGNOSIS — M25562 Pain in left knee: Secondary | ICD-10-CM | POA: Insufficient documentation

## 2020-07-02 DIAGNOSIS — M25561 Pain in right knee: Secondary | ICD-10-CM | POA: Insufficient documentation

## 2020-07-02 DIAGNOSIS — M25551 Pain in right hip: Secondary | ICD-10-CM | POA: Insufficient documentation

## 2020-07-02 MED ORDER — MORPHINE SULFATE ER 15 MG PO TBCR: 15.0000 mg | EXTENDED_RELEASE_TABLET | Freq: Two times a day (BID) | ORAL | 0 refills | Status: DC

## 2020-07-02 NOTE — Patient Instructions (Signed)
____________________________________________________________________________________________  Drug Holidays (Slow)  What is a "Drug Holiday"? Drug Holiday: is the name given to the period of time during which a patient stops taking a medication(s) for the purpose of eliminating tolerance to the drug.  Benefits . Improved effectiveness of opioids. . Decreased opioid dose needed to achieve benefits. . Improved pain with lesser dose.  What is tolerance? Tolerance: is the progressive decreased in effectiveness of a drug due to its repetitive use. With repetitive use, the body gets use to the medication and as a consequence, it loses its effectiveness. This is a common problem seen with opioid pain medications. As a result, a larger dose of the drug is needed to achieve the same effect that used to be obtained with a smaller dose.  How long should a "Drug Holiday" last? You should stay off of the pain medicine for at least 14 consecutive days. (2 weeks)  Should I stop the medicine "cold turkey"? No. You should always coordinate with your Pain Specialist so that he/she can provide you with the correct medication dose to make the transition as smoothly as possible.  How do I stop the medicine? Slowly. You will be instructed to decrease the daily amount of pills that you take by one (1) pill every seven (7) days. This is called a "slow downward taper" of your dose. For example: if you normally take four (4) pills per day, you will be asked to drop this dose to three (3) pills per day for seven (7) days, then to two (2) pills per day for seven (7) days, then to one (1) per day for seven (7) days, and at the end of those last seven (7) days, this is when the "Drug Holiday" would start.   Will I have withdrawals? By doing a "slow downward taper" like this one, it is unlikely that you will experience any significant withdrawal symptoms. Typically, what triggers withdrawals is the sudden stop of a high  dose opioid therapy. Withdrawals can usually be avoided by slowly decreasing the dose over a prolonged period of time. If you do not follow these instructions and decide to stop your medication abruptly, withdrawals may be possible.  What are withdrawals? Withdrawals: refers to the wide range of symptoms that occur after stopping or dramatically reducing opiate drugs after heavy and prolonged use. Withdrawal symptoms do not occur to patients that use low dose opioids, or those who take the medication sporadically. Contrary to benzodiazepine (example: Valium, Xanax, etc.) or alcohol withdrawals ("Delirium Tremens"), opioid withdrawals are not lethal. Withdrawals are the physical manifestation of the body getting rid of the excess receptors.  Expected Symptoms Early symptoms of withdrawal may include: . Agitation . Anxiety . Muscle aches . Increased tearing . Insomnia . Runny nose . Sweating . Yawning  Late symptoms of withdrawal may include: . Abdominal cramping . Diarrhea . Dilated pupils . Goose bumps . Nausea . Vomiting  Will I experience withdrawals? Due to the slow nature of the taper, it is very unlikely that you will experience any.  What is a slow taper? Taper: refers to the gradual decrease in dose.  (Last update: 02/06/2020) ____________________________________________________________________________________________    ____________________________________________________________________________________________  Medication Rules  Purpose: To inform patients, and their family members, of our rules and regulations.  Applies to: All patients receiving prescriptions (written or electronic).  Pharmacy of record: Pharmacy where electronic prescriptions will be sent. If written prescriptions are taken to a different pharmacy, please inform the nursing staff. The pharmacy   listed in the electronic medical record should be the one where you would like electronic prescriptions  to be sent.  Electronic prescriptions: In compliance with the [place] Strengthen Opioid Misuse Prevention (STOP) Act of 2017 (Session Law 2017-74/H243), effective July 19, 2018, all controlled substances must be electronically prescribed. Calling prescriptions to the pharmacy will cease to exist.  Prescription refills: Only during scheduled appointments. Applies to all prescriptions.  NOTE: The following applies primarily to controlled substances (Opioid* Pain Medications).   Type of encounter (visit): For patients receiving controlled substances, face-to-face visits are required. (Not an option or up to the patient.)  Patient's responsibilities: 1. Pain Pills: Bring all pain pills to every appointment (except for procedure appointments). 2. Pill Bottles: Bring pills in original pharmacy bottle. Always bring the newest bottle. Bring bottle, even if empty. 3. Medication refills: You are responsible for knowing and keeping track of what medications you take and those you need refilled. The day before your appointment: write a list of all prescriptions that need to be refilled. The day of the appointment: give the list to the admitting nurse. Prescriptions will be written only during appointments. No prescriptions will be written on procedure days. If you forget a medication: it will not be "Called in", "Faxed", or "electronically sent". You will need to get another appointment to get these prescribed. No early refills. Do not call asking to have your prescription filled early. 4. Prescription Accuracy: You are responsible for carefully inspecting your prescriptions before leaving our office. Have the discharge nurse carefully go over each prescription with you, before taking them home. Make sure that your name is accurately spelled, that your address is correct. Check the name and dose of your medication to make sure it is accurate. Check the number of pills, and the written instructions to  make sure they are clear and accurate. Make sure that you are given enough medication to last until your next medication refill appointment. 5. Taking Medication: Take medication as prescribed. When it comes to controlled substances, taking less pills or less frequently than prescribed is permitted and encouraged. Never take more pills than instructed. Never take medication more frequently than prescribed.  6. Inform other Doctors: Always inform, all of your healthcare providers, of all the medications you take. 7. Pain Medication from other Providers: You are not allowed to accept any additional pain medication from any other Doctor or Healthcare provider. There are two exceptions to this rule. (see below) In the event that you require additional pain medication, you are responsible for notifying us, as stated below. 8. Cough Medicine: Often these contain an opioid, such as codeine or hydrocodone. Never accept or take cough medicine containing these opioids if you are already taking an opioid* medication. The combination may cause respiratory failure and death. 9. Medication Agreement: You are responsible for carefully reading and following our Medication Agreement. This must be signed before receiving any prescriptions from our practice. Safely store a copy of your signed Agreement. Violations to the Agreement will result in no further prescriptions. (Additional copies of our Medication Agreement are available upon request.) 10. Laws, Rules, & Regulations: All patients are expected to follow all Federal and State Laws, Statutes, Rules, & Regulations. Ignorance of the Laws does not constitute a valid excuse.  11. Illegal drugs and Controlled Substances: The use of illegal substances (including, but not limited to marijuana and its derivatives) and/or the illegal use of any controlled substances is strictly prohibited. Violation of this rule may   result in the immediate and permanent discontinuation of any  and all prescriptions being written by our practice. The use of any illegal substances is prohibited. 12. Adopted CDC guidelines & recommendations: Target dosing levels will be at or below 60 MME/day. Use of benzodiazepines** is not recommended.  Exceptions: There are only two exceptions to the rule of not receiving pain medications from other Healthcare Providers. 1. Exception #1 (Emergencies): In the event of an emergency (i.e.: accident requiring emergency care), you are allowed to receive additional pain medication. However, you are responsible for: As soon as you are able, call our office [phone], at any time of the day or night, and leave a message stating your name, the date and nature of the emergency, and the name and dose of the medication prescribed. In the event that your call is answered by a member of our staff, make sure to document and save the date, time, and the name of the person that took your information.  2. Exception #2 (Planned Surgery): In the event that you are scheduled by another doctor or dentist to have any type of surgery or procedure, you are allowed (for a period no longer than 30 days), to receive additional pain medication, for the acute post-op pain. However, in this case, you are responsible for picking up a copy of our "Post-op Pain Management for Surgeons" handout, and giving it to your surgeon or dentist. This document is available at our office, and does not require an appointment to obtain it. Simply go to our office during business hours (Monday-Thursday from 8:00 AM to 4:00 PM) (Friday 8:00 AM to 12:00 Noon) or if you have a scheduled appointment with us, prior to your surgery, and ask for it by name. In addition, you are responsible for: calling our office [phone], at any time of the day or night, and leaving a message stating your name, name of your surgeon, type of surgery, and date of procedure or surgery. Failure to comply with your responsibilities  may result in termination of therapy involving the controlled substances.  *Opioid medications include: morphine, codeine, oxycodone, oxymorphone, hydrocodone, hydromorphone, meperidine, tramadol, tapentadol, buprenorphine, fentanyl, methadone. **Benzodiazepine medications include: diazepam (Valium), alprazolam (Xanax), clonazepam (Klonopine), lorazepam (Ativan), clorazepate (Tranxene), chlordiazepoxide (Librium), estazolam (Prosom), oxazepam (Serax), temazepam (Restoril), triazolam (Halcion) (Last updated: 06/16/2020) ____________________________________________________________________________________________   ____________________________________________________________________________________________  Medication Recommendations and Reminders  Applies to: All patients receiving prescriptions (written and/or electronic).  Medication Rules & Regulations: These rules and regulations exist for your safety and that of others. They are not flexible and neither are we. Dismissing or ignoring them will be considered "non-compliance" with medication therapy, resulting in complete and irreversible termination of such therapy. (See document titled "Medication Rules" for more details.) In all conscience, because of safety reasons, we cannot continue providing a therapy where the patient does not follow instructions.  Pharmacy of record:   Definition: This is the pharmacy where your electronic prescriptions will be sent.   We do not endorse any particular pharmacy, however, we have experienced problems with [facility] not securing enough medication supply for the community.  We do not restrict you in your choice of pharmacy. However, once we write for your prescriptions, we will NOT be re-sending more prescriptions to fix restricted supply problems created by your pharmacy, or your insurance.   The pharmacy listed in the electronic medical record should be the one where you want electronic prescriptions  to be sent.  If you choose to change pharmacy,   simply notify our nursing staff.  Recommendations:  Keep all of your pain medications in a safe place, under lock and key, even if you live alone. We will NOT replace lost, stolen, or damaged medication.  After you fill your prescription, take 1 week's worth of pills and put them away in a safe place. You should keep a separate, properly labeled bottle for this purpose. The remainder should be kept in the original bottle. Use this as your primary supply, until it runs out. Once it's gone, then you know that you have 1 week's worth of medicine, and it is time to come in for a prescription refill. If you do this correctly, it is unlikely that you will ever run out of medicine.  To make sure that the above recommendation works, it is very important that you make sure your medication refill appointments are scheduled at least 1 week before you run out of medicine. To do this in an effective manner, make sure that you do not leave the office without scheduling your next medication management appointment. Always ask the nursing staff to show you in your prescription , when your medication will be running out. Then arrange for the receptionist to get you a return appointment, at least 7 days before you run out of medicine. Do not wait until you have 1 or 2 pills left, to come in. This is very poor planning and does not take into consideration that we may need to cancel appointments due to bad weather, sickness, or emergencies affecting our staff.  DO NOT ACCEPT A "Partial Fill": If for any reason your pharmacy does not have enough pills/tablets to completely fill or refill your prescription, do not allow for a "partial fill". The law allows the pharmacy to complete that prescription within 72 hours, without requiring a new prescription. If they do not fill the rest of your prescription within those 72 hours, you will need a separate prescription to fill the  remaining amount, which we will NOT provide. If the reason for the partial fill is your insurance, you will need to talk to the pharmacist about payment alternatives for the remaining tablets, but again, DO NOT ACCEPT A PARTIAL FILL, unless you can trust your pharmacist to obtain the remainder of the pills within 72 hours.  Prescription refills and/or changes in medication(s):   Prescription refills, and/or changes in dose or medication, will be conducted only during scheduled medication management appointments. (Applies to both, written and electronic prescriptions.)  No refills on procedure days. No medication will be changed or started on procedure days. No changes, adjustments, and/or refills will be conducted on a procedure day. Doing so will interfere with the diagnostic portion of the procedure.  No phone refills. No medications will be "called into the pharmacy".  No Fax refills.  No weekend refills.  No Holliday refills.  No after hours refills.  Remember:  Business hours are:  Monday to Thursday 8:00 AM to 4:00 PM Provider's Schedule: Sharonlee Nine, MD - Appointments are:  Medication management: Monday and Wednesday 8:00 AM to 4:00 PM Procedure day: Tuesday and Thursday 7:30 AM to 4:00 PM Bilal Lateef, MD - Appointments are:  Medication management: Tuesday and Thursday 8:00 AM to 4:00 PM Procedure day: Monday and Wednesday 7:30 AM to 4:00 PM (Last update: 02/06/2020) ____________________________________________________________________________________________   ____________________________________________________________________________________________  CBD (cannabidiol) WARNING  Applicable to: All individuals currently taking or considering taking CBD (cannabidiol) and, more important, all patients taking opioid analgesic controlled substances (pain   medication). (Example: oxycodone; oxymorphone; hydrocodone; hydromorphone; morphine; methadone; tramadol; tapentadol;  fentanyl; buprenorphine; butorphanol; dextromethorphan; meperidine; codeine; etc.)  Legal status: CBD remains a Schedule I drug prohibited for any use. CBD is illegal with one exception. In the [place], CBD has a limited Food and Drug Administration (FDA) approval for the treatment of two specific types of epilepsy disorders. Only one CBD product has been approved by the FDA for this purpose: "Epidiolex". FDA is aware that some companies are marketing products containing cannabis and cannabis-derived compounds in ways that violate the Federal Food, Drug and Cosmetic Act (FD&C Act) and that may put the health and safety of consumers at risk. The FDA, a Federal agency, has not enforced the CBD status since 2018.   Legality: Some manufacturers ship CBD products nationally, which is illegal. Often such products are sold online and are therefore available throughout the country. CBD is openly sold in head shops and health food stores in some states where such sales have not been explicitly legalized. Selling unapproved products with unsubstantiated therapeutic claims is not only a violation of the law, but also can put patients at risk, as these products have not been proven to be safe or effective. Federal illegality makes it difficult to conduct research on CBD.  Reference: "FDA Regulation of Cannabis and Cannabis-Derived Products, Including Cannabidiol (CBD)" - https://www.fda.gov/news-events/public-health-focus/fda-regulation-cannabis-and-cannabis-derived-products-including-cannabidiol-cbd  Warning: CBD is not FDA approved and has not undergo the same manufacturing controls as prescription drugs.  This means that the purity and safety of available CBD may be questionable. Most of the time, despite manufacturer's claims, it is contaminated with THC (delta-9-tetrahydrocannabinol - the chemical in marijuana responsible for the "HIGH").  When this is the case, the THC contaminant will trigger a positive  urine drug screen (UDS) test for Marijuana (carboxy-THC). Because a positive UDS for any illicit substance is a violation of our medication agreement, your opioid analgesics (pain medicine) may be permanently discontinued.  MORE ABOUT CBD  General Information: CBD  is a derivative of the Marijuana (cannabis sativa) plant discovered in 1940. It is one of the 113 identified substances found in Marijuana. It accounts for up to 40% of the plant's extract. As of 2018, preliminary clinical studies on CBD included research for the treatment of anxiety, movement disorders, and pain. CBD is available and consumed in multiple forms, including inhalation of smoke or vapor, as an aerosol spray, and by mouth. It may be supplied as an oil containing CBD, capsules, dried cannabis, or as a liquid solution. CBD is thought not to be as psychoactive as THC (delta-9-tetrahydrocannabinol - the chemical in marijuana responsible for the "HIGH"). Studies suggest that CBD may interact with different biological target receptors in the body, including cannabinoid and other neurotransmitter receptors. As of 2018 the mechanism of action for its biological effects has not been determined.  Side-effects  Adverse reactions: Dry mouth, diarrhea, decreased appetite, fatigue, drowsiness, malaise, weakness, sleep disturbances, and others.  Drug interactions: CBC may interact with other medications such as blood-thinners. (Last update: 02/23/2020) ____________________________________________________________________________________________    

## 2020-07-02 NOTE — Progress Notes (Signed)
Nursing Pain Medication Assessment:  Safety precautions to be maintained throughout the outpatient stay will include: orient to surroundings, keep bed in low position, maintain call bell within reach at all times, provide assistance with transfer out of bed and ambulation.  Medication Inspection Compliance: Mr. Couzens did not comply with our request to bring his pills to be counted. He was reminded that bringing the medication bottles, even when empty, is a requirement.  Medication: None brought in. Pill/Patch Count: None available to be counted. Bottle Appearance: No container available. Did not bring bottle(s) to appointment. Filled Date: N/A Last Medication intake:  Today

## 2020-07-03 NOTE — Telephone Encounter (Signed)
The patient called to see if her prior auth had been done

## 2020-07-03 NOTE — Telephone Encounter (Signed)
Patient notified that PA had been approved.

## 2020-07-04 ENCOUNTER — Telehealth: Payer: Self-pay | Admitting: Pain Medicine

## 2020-07-04 NOTE — Telephone Encounter (Signed)
Attempted to call patient to notify  Her that her prescription has been approved.

## 2020-07-04 NOTE — Telephone Encounter (Signed)
Patient lvmail stating pharmacy will only give her 14 days supply of meds, please call pharmacy and see what is needed.

## 2020-08-12 DIAGNOSIS — R0602 Shortness of breath: Secondary | ICD-10-CM | POA: Insufficient documentation

## 2020-08-12 DIAGNOSIS — N289 Disorder of kidney and ureter, unspecified: Secondary | ICD-10-CM | POA: Insufficient documentation

## 2020-08-12 DIAGNOSIS — I272 Pulmonary hypertension, unspecified: Secondary | ICD-10-CM | POA: Insufficient documentation

## 2020-08-20 ENCOUNTER — Ambulatory Visit: Payer: Medicaid Other | Admitting: Dermatology

## 2020-09-01 ENCOUNTER — Other Ambulatory Visit: Payer: Self-pay | Admitting: Orthopedic Surgery

## 2020-09-01 DIAGNOSIS — M1732 Unilateral post-traumatic osteoarthritis, left knee: Secondary | ICD-10-CM

## 2020-09-02 ENCOUNTER — Ambulatory Visit: Payer: Medicaid Other | Admitting: Dermatology

## 2020-09-03 ENCOUNTER — Ambulatory Visit: Payer: Medicaid Other | Admitting: Dermatology

## 2020-09-22 ENCOUNTER — Other Ambulatory Visit: Payer: Self-pay | Admitting: Cardiovascular Disease

## 2020-09-22 NOTE — Telephone Encounter (Signed)
Unable to lvm to schedule fu. Erica [name]

## 2020-09-22 NOTE — Telephone Encounter (Signed)
Please schedule overdue F/U appointment. Thank you! ?

## 2020-09-23 ENCOUNTER — Ambulatory Visit
Admission: RE | Admit: 2020-09-23 | Discharge: 2020-09-23 | Disposition: A | Discharge status: Home / Self Care | Payer: Medicaid Other | Source: Ambulatory Visit | Attending: Orthopedic Surgery | Admitting: Orthopedic Surgery

## 2020-09-23 ENCOUNTER — Other Ambulatory Visit: Payer: Self-pay

## 2020-09-23 DIAGNOSIS — M1732 Unilateral post-traumatic osteoarthritis, left knee: Secondary | ICD-10-CM | POA: Insufficient documentation

## 2020-09-24 ENCOUNTER — Other Ambulatory Visit: Payer: Self-pay | Admitting: Cardiovascular Disease

## 2020-09-24 NOTE — Telephone Encounter (Signed)
Rx request sent to pharmacy.  

## 2020-09-26 NOTE — Telephone Encounter (Signed)
Scheduled on 3/22

## 2020-09-27 NOTE — Progress Notes (Deleted)
Called at 11:50 AM to let us know that her truck broke down.

## 2020-09-29 ENCOUNTER — Encounter: Payer: Medicaid Other | Admitting: Pain Medicine

## 2020-10-01 ENCOUNTER — Telehealth: Payer: Self-pay

## 2020-10-01 NOTE — Telephone Encounter (Signed)
She runs of her meds on 3/21. She had to cancel her appt. Monday because her car broke down. Can I put her on tomorrow after procedures?

## 2020-10-01 NOTE — Telephone Encounter (Signed)
I have sent a bubble to Dr. Dorris Carnes. I will let you know if I get an answer.

## 2020-10-02 ENCOUNTER — Encounter: Payer: Self-pay | Admitting: Pain Medicine

## 2020-10-02 ENCOUNTER — Other Ambulatory Visit: Payer: Self-pay

## 2020-10-02 ENCOUNTER — Ambulatory Visit: Payer: Medicaid Other | Attending: Pain Medicine | Admitting: Pain Medicine

## 2020-10-02 DIAGNOSIS — M4312 Spondylolisthesis, cervical region: Secondary | ICD-10-CM | POA: Insufficient documentation

## 2020-10-02 DIAGNOSIS — M542 Cervicalgia: Secondary | ICD-10-CM

## 2020-10-02 DIAGNOSIS — M170 Bilateral primary osteoarthritis of knee: Secondary | ICD-10-CM

## 2020-10-02 DIAGNOSIS — G894 Chronic pain syndrome: Secondary | ICD-10-CM

## 2020-10-02 DIAGNOSIS — M545 Low back pain, unspecified: Secondary | ICD-10-CM | POA: Diagnosis not present

## 2020-10-02 DIAGNOSIS — G8929 Other chronic pain: Secondary | ICD-10-CM

## 2020-10-02 DIAGNOSIS — M47816 Spondylosis without myelopathy or radiculopathy, lumbar region: Secondary | ICD-10-CM

## 2020-10-02 DIAGNOSIS — M25552 Pain in left hip: Secondary | ICD-10-CM

## 2020-10-02 DIAGNOSIS — M25561 Pain in right knee: Secondary | ICD-10-CM

## 2020-10-02 DIAGNOSIS — M160 Bilateral primary osteoarthritis of hip: Secondary | ICD-10-CM

## 2020-10-02 DIAGNOSIS — M5030 Other cervical disc degeneration, unspecified cervical region: Secondary | ICD-10-CM

## 2020-10-02 DIAGNOSIS — M5137 Other intervertebral disc degeneration, lumbosacral region: Secondary | ICD-10-CM | POA: Diagnosis not present

## 2020-10-02 DIAGNOSIS — M25562 Pain in left knee: Secondary | ICD-10-CM

## 2020-10-02 DIAGNOSIS — M25551 Pain in right hip: Secondary | ICD-10-CM

## 2020-10-02 DIAGNOSIS — M4310 Spondylolisthesis, site unspecified: Secondary | ICD-10-CM

## 2020-10-02 DIAGNOSIS — Z79899 Other long term (current) drug therapy: Secondary | ICD-10-CM

## 2020-10-02 DIAGNOSIS — F1190 Opioid use, unspecified, uncomplicated: Secondary | ICD-10-CM

## 2020-10-02 MED ORDER — MORPHINE SULFATE ER 15 MG PO TBCR: 15.0000 mg | EXTENDED_RELEASE_TABLET | Freq: Two times a day (BID) | ORAL | 0 refills | Status: DC
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
## 2020-10-07 ENCOUNTER — Ambulatory Visit: Payer: Medicaid Other | Admitting: Family
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
## 2020-10-09 ENCOUNTER — Encounter: Payer: Self-pay | Admitting: Family

## 2020-10-13 ENCOUNTER — Ambulatory Visit: Payer: Medicaid Other | Admitting: Dermatology

## 2020-10-20 ENCOUNTER — Other Ambulatory Visit: Payer: Self-pay | Admitting: Pain Medicine

## 2020-10-20 DIAGNOSIS — G609 Hereditary and idiopathic neuropathy, unspecified: Secondary | ICD-10-CM

## 2020-10-20 DIAGNOSIS — M792 Neuralgia and neuritis, unspecified: Secondary | ICD-10-CM

## 2020-10-26 DIAGNOSIS — Z79891 Long term (current) use of opiate analgesic: Secondary | ICD-10-CM | POA: Insufficient documentation
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
## 2020-10-27 ENCOUNTER — Encounter: Payer: Self-pay | Admitting: Pain Medicine

## 2020-10-27 ENCOUNTER — Ambulatory Visit: Payer: Medicaid Other | Attending: Pain Medicine | Admitting: Pain Medicine

## 2020-10-27 ENCOUNTER — Other Ambulatory Visit: Payer: Self-pay

## 2020-10-27 VITALS — BP 125/91 | HR 87 | Temp 97.2°F | Resp 16 | Ht 68.0 in | Wt 200.0 lb

## 2020-10-27 DIAGNOSIS — Z79899 Other long term (current) drug therapy: Secondary | ICD-10-CM | POA: Insufficient documentation

## 2020-10-27 DIAGNOSIS — M25552 Pain in left hip: Secondary | ICD-10-CM | POA: Insufficient documentation

## 2020-10-27 DIAGNOSIS — G8929 Other chronic pain: Secondary | ICD-10-CM | POA: Diagnosis present

## 2020-10-27 DIAGNOSIS — F1120 Opioid dependence, uncomplicated: Secondary | ICD-10-CM | POA: Insufficient documentation

## 2020-10-27 DIAGNOSIS — G894 Chronic pain syndrome: Secondary | ICD-10-CM | POA: Diagnosis not present

## 2020-10-27 DIAGNOSIS — M25551 Pain in right hip: Secondary | ICD-10-CM | POA: Diagnosis not present

## 2020-10-27 DIAGNOSIS — M25562 Pain in left knee: Secondary | ICD-10-CM | POA: Insufficient documentation

## 2020-10-27 DIAGNOSIS — Z79891 Long term (current) use of opiate analgesic: Secondary | ICD-10-CM | POA: Insufficient documentation

## 2020-10-27 DIAGNOSIS — M545 Low back pain, unspecified: Secondary | ICD-10-CM | POA: Diagnosis present

## 2020-10-27 DIAGNOSIS — M25561 Pain in right knee: Secondary | ICD-10-CM | POA: Insufficient documentation

## 2020-10-27 MED ORDER — MORPHINE SULFATE ER 15 MG PO TBCR
15.0000 mg | EXTENDED_RELEASE_TABLET | Freq: Two times a day (BID) | ORAL | 0 refills | Status: DC
Start: 2020-12-01 — End: 2021-01-27

## 2020-10-27 MED ORDER — MORPHINE SULFATE ER 15 MG PO TBCR
15.0000 mg | EXTENDED_RELEASE_TABLET | Freq: Two times a day (BID) | ORAL | 0 refills | Status: DC
Start: 2020-11-01 — End: 2021-01-27

## 2020-10-27 MED ORDER — MORPHINE SULFATE ER 15 MG PO TBCR
15.0000 mg | EXTENDED_RELEASE_TABLET | Freq: Two times a day (BID) | ORAL | 0 refills | Status: DC
Start: 2020-12-31 — End: 2021-02-11

## 2020-10-27 NOTE — Progress Notes (Signed)
Nursing Pain Medication Assessment:  Safety precautions to be maintained throughout the outpatient stay will include: orient to surroundings, keep bed in low position, maintain call bell within reach at all times, provide assistance with transfer out of bed and ambulation.  Medication Inspection Compliance: Pill count conducted under aseptic conditions, in front of the patient. Neither the pills nor the bottle was removed from the patient's sight at any time. Once count was completed pills were immediately returned to the patient in their original bottle.  Medication: Morphine ER (MSContin) Pill/Patch Count: 22 of 60 pills remain Pill/Patch Appearance: Markings consistent with prescribed medication Bottle Appearance: Standard pharmacy container. Clearly labeled. Filled Date: 03 / 19 / 2022 Last Medication intake:  Today

## 2020-10-27 NOTE — Patient Instructions (Signed)
____________________________________________________________________________________________  Medication Recommendations and Reminders  Applies to: All patients receiving prescriptions (written and/or electronic).  Medication Rules & Regulations: These rules and regulations exist for your safety and that of others. They are not flexible and neither are we. Dismissing or ignoring them will be considered "non-compliance" with medication therapy, resulting in complete and irreversible termination of such therapy. (See document titled "Medication Rules" for more details.) In all conscience, because of safety reasons, we cannot continue providing a therapy where the patient does not follow instructions.  Pharmacy of record:   Definition: This is the pharmacy where your electronic prescriptions will be sent.   We do not endorse any particular pharmacy, however, we have experienced problems with [facility] not securing enough medication supply for the community.  We do not restrict you in your choice of pharmacy. However, once we write for your prescriptions, we will NOT be re-sending more prescriptions to fix restricted supply problems created by your pharmacy, or your insurance.   The pharmacy listed in the electronic medical record should be the one where you want electronic prescriptions to be sent.  If you choose to change pharmacy, simply notify our nursing staff.  Recommendations:  Keep all of your pain medications in a safe place, under lock and key, even if you live alone. We will NOT replace lost, stolen, or damaged medication.  After you fill your prescription, take 1 week's worth of pills and put them away in a safe place. You should keep a separate, properly labeled bottle for this purpose. The remainder should be kept in the original bottle. Use this as your primary supply, until it runs out. Once it's gone, then you know that you have 1 week's worth of medicine, and it is time to come  in for a prescription refill. If you do this correctly, it is unlikely that you will ever run out of medicine.  To make sure that the above recommendation works, it is very important that you make sure your medication refill appointments are scheduled at least 1 week before you run out of medicine. To do this in an effective manner, make sure that you do not leave the office without scheduling your next medication management appointment. Always ask the nursing staff to show you in your prescription , when your medication will be running out. Then arrange for the receptionist to get you a return appointment, at least 7 days before you run out of medicine. Do not wait until you have 1 or 2 pills left, to come in. This is very poor planning and does not take into consideration that we may need to cancel appointments due to bad weather, sickness, or emergencies affecting our staff.  DO NOT ACCEPT A "Partial Fill": If for any reason your pharmacy does not have enough pills/tablets to completely fill or refill your prescription, do not allow for a "partial fill". The law allows the pharmacy to complete that prescription within 72 hours, without requiring a new prescription. If they do not fill the rest of your prescription within those 72 hours, you will need a separate prescription to fill the remaining amount, which we will NOT provide. If the reason for the partial fill is your insurance, you will need to talk to the pharmacist about payment alternatives for the remaining tablets, but again, DO NOT ACCEPT A PARTIAL FILL, unless you can trust your pharmacist to obtain the remainder of the pills within 72 hours.  Prescription refills and/or changes in medication(s):     Prescription refills, and/or changes in dose or medication, will be conducted only during scheduled medication management appointments. (Applies to both, written and electronic prescriptions.)  No refills on procedure days. No medication will be  changed or started on procedure days. No changes, adjustments, and/or refills will be conducted on a procedure day. Doing so will interfere with the diagnostic portion of the procedure.  No phone refills. No medications will be "called into the pharmacy".  No Fax refills.  No weekend refills.  No Holliday refills.  No after hours refills.  Remember:  Business hours are:  Monday to Thursday 8:00 AM to 4:00 PM Provider's Schedule: Gerldine Suleiman, MD - Appointments are:  Medication management: Monday and Wednesday 8:00 AM to 4:00 PM Procedure day: Tuesday and Thursday 7:30 AM to 4:00 PM Bilal Lateef, MD - Appointments are:  Medication management: Tuesday and Thursday 8:00 AM to 4:00 PM Procedure day: Monday and Wednesday 7:30 AM to 4:00 PM (Last update: 02/06/2020) ____________________________________________________________________________________________   ____________________________________________________________________________________________  CBD (cannabidiol) WARNING  Applicable to: All individuals currently taking or considering taking CBD (cannabidiol) and, more important, all patients taking opioid analgesic controlled substances (pain medication). (Example: oxycodone; oxymorphone; hydrocodone; hydromorphone; morphine; methadone; tramadol; tapentadol; fentanyl; buprenorphine; butorphanol; dextromethorphan; meperidine; codeine; etc.)  Legal status: CBD remains a Schedule I drug prohibited for any use. CBD is illegal with one exception. In the [place], CBD has a limited Food and Drug Administration (FDA) approval for the treatment of two specific types of epilepsy disorders. Only one CBD product has been approved by the FDA for this purpose: "Epidiolex". FDA is aware that some companies are marketing products containing cannabis and cannabis-derived compounds in ways that violate the Federal Food, Drug and Cosmetic Act (FD&C Act) and that may put the health and safety  of consumers at risk. The FDA, a Federal agency, has not enforced the CBD status since 2018.   Legality: Some manufacturers ship CBD products nationally, which is illegal. Often such products are sold online and are therefore available throughout the country. CBD is openly sold in head shops and health food stores in some states where such sales have not been explicitly legalized. Selling unapproved products with unsubstantiated therapeutic claims is not only a violation of the law, but also can put patients at risk, as these products have not been proven to be safe or effective. Federal illegality makes it difficult to conduct research on CBD.  Reference: "FDA Regulation of Cannabis and Cannabis-Derived Products, Including Cannabidiol (CBD)" - https://www.fda.gov/news-events/public-health-focus/fda-regulation-cannabis-and-cannabis-derived-products-including-cannabidiol-cbd  Warning: CBD is not FDA approved and has not undergo the same manufacturing controls as prescription drugs.  This means that the purity and safety of available CBD may be questionable. Most of the time, despite manufacturer's claims, it is contaminated with THC (delta-9-tetrahydrocannabinol - the chemical in marijuana responsible for the "HIGH").  When this is the case, the THC contaminant will trigger a positive urine drug screen (UDS) test for Marijuana (carboxy-THC). Because a positive UDS for any illicit substance is a violation of our medication agreement, your opioid analgesics (pain medicine) may be permanently discontinued.  MORE ABOUT CBD  General Information: CBD  is a derivative of the Marijuana (cannabis sativa) plant discovered in 1940. It is one of the 113 identified substances found in Marijuana. It accounts for up to 40% of the plant's extract. As of 2018, preliminary clinical studies on CBD included research for the treatment of anxiety, movement disorders, and pain. CBD is available and consumed in multiple forms,  including   inhalation of smoke or vapor, as an aerosol spray, and by mouth. It may be supplied as an oil containing CBD, capsules, dried cannabis, or as a liquid solution. CBD is thought not to be as psychoactive as THC (delta-9-tetrahydrocannabinol - the chemical in marijuana responsible for the "HIGH"). Studies suggest that CBD may interact with different biological target receptors in the body, including cannabinoid and other neurotransmitter receptors. As of 2018 the mechanism of action for its biological effects has not been determined.  Side-effects  Adverse reactions: Dry mouth, diarrhea, decreased appetite, fatigue, drowsiness, malaise, weakness, sleep disturbances, and others.  Drug interactions: CBC may interact with other medications such as blood-thinners. (Last update: 02/23/2020) ____________________________________________________________________________________________   ____________________________________________________________________________________________  Medication Rules  Purpose: To inform patients, and their family members, of our rules and regulations.  Applies to: All patients receiving prescriptions (written or electronic).  Pharmacy of record: Pharmacy where electronic prescriptions will be sent. If written prescriptions are taken to a different pharmacy, please inform the nursing staff. The pharmacy listed in the electronic medical record should be the one where you would like electronic prescriptions to be sent.  Electronic prescriptions: In compliance with the [place] Strengthen Opioid Misuse Prevention (STOP) Act of 2017 (Session Law 2017-74/H243), effective July 19, 2018, all controlled substances must be electronically prescribed. Calling prescriptions to the pharmacy will cease to exist.  Prescription refills: Only during scheduled appointments. Applies to all prescriptions.  NOTE: The following applies primarily to controlled substances (Opioid*  Pain Medications).   Type of encounter (visit): For patients receiving controlled substances, face-to-face visits are required. (Not an option or up to the patient.)  Patient's responsibilities: 1. Pain Pills: Bring all pain pills to every appointment (except for procedure appointments). 2. Pill Bottles: Bring pills in original pharmacy bottle. Always bring the newest bottle. Bring bottle, even if empty. 3. Medication refills: You are responsible for knowing and keeping track of what medications you take and those you need refilled. The day before your appointment: write a list of all prescriptions that need to be refilled. The day of the appointment: give the list to the admitting nurse. Prescriptions will be written only during appointments. No prescriptions will be written on procedure days. If you forget a medication: it will not be "Called in", "Faxed", or "electronically sent". You will need to get another appointment to get these prescribed. No early refills. Do not call asking to have your prescription filled early. 4. Prescription Accuracy: You are responsible for carefully inspecting your prescriptions before leaving our office. Have the discharge nurse carefully go over each prescription with you, before taking them home. Make sure that your name is accurately spelled, that your address is correct. Check the name and dose of your medication to make sure it is accurate. Check the number of pills, and the written instructions to make sure they are clear and accurate. Make sure that you are given enough medication to last until your next medication refill appointment. 5. Taking Medication: Take medication as prescribed. When it comes to controlled substances, taking less pills or less frequently than prescribed is permitted and encouraged. Never take more pills than instructed. Never take medication more frequently than prescribed.  6. Inform other Doctors: Always inform, all of your  healthcare providers, of all the medications you take. 7. Pain Medication from other Providers: You are not allowed to accept any additional pain medication from any other Doctor or Healthcare provider. There are two exceptions to this rule. (see below) In   the event that you require additional pain medication, you are responsible for notifying us, as stated below. 8. Cough Medicine: Often these contain an opioid, such as codeine or hydrocodone. Never accept or take cough medicine containing these opioids if you are already taking an opioid* medication. The combination may cause respiratory failure and death. 9. Medication Agreement: You are responsible for carefully reading and following our Medication Agreement. This must be signed before receiving any prescriptions from our practice. Safely store a copy of your signed Agreement. Violations to the Agreement will result in no further prescriptions. (Additional copies of our Medication Agreement are available upon request.) 10. Laws, Rules, & Regulations: All patients are expected to follow all Federal and State Laws, Statutes, Rules, & Regulations. Ignorance of the Laws does not constitute a valid excuse.  11. Illegal drugs and Controlled Substances: The use of illegal substances (including, but not limited to marijuana and its derivatives) and/or the illegal use of any controlled substances is strictly prohibited. Violation of this rule may result in the immediate and permanent discontinuation of any and all prescriptions being written by our practice. The use of any illegal substances is prohibited. 12. Adopted CDC guidelines & recommendations: Target dosing levels will be at or below 60 MME/day. Use of benzodiazepines** is not recommended.  Exceptions: There are only two exceptions to the rule of not receiving pain medications from other Healthcare Providers. 1. Exception #1 (Emergencies): In the event of an emergency (i.e.: accident requiring emergency  care), you are allowed to receive additional pain medication. However, you are responsible for: As soon as you are able, call our office [phone], at any time of the day or night, and leave a message stating your name, the date and nature of the emergency, and the name and dose of the medication prescribed. In the event that your call is answered by a member of our staff, make sure to document and save the date, time, and the name of the person that took your information.  2. Exception #2 (Planned Surgery): In the event that you are scheduled by another doctor or dentist to have any type of surgery or procedure, you are allowed (for a period no longer than 30 days), to receive additional pain medication, for the acute post-op pain. However, in this case, you are responsible for picking up a copy of our "Post-op Pain Management for Surgeons" handout, and giving it to your surgeon or dentist. This document is available at our office, and does not require an appointment to obtain it. Simply go to our office during business hours (Monday-Thursday from 8:00 AM to 4:00 PM) (Friday 8:00 AM to 12:00 Noon) or if you have a scheduled appointment with us, prior to your surgery, and ask for it by name. In addition, you are responsible for: calling our office [phone], at any time of the day or night, and leaving a message stating your name, name of your surgeon, type of surgery, and date of procedure or surgery. Failure to comply with your responsibilities may result in termination of therapy involving the controlled substances.  *Opioid medications include: morphine, codeine, oxycodone, oxymorphone, hydrocodone, hydromorphone, meperidine, tramadol, tapentadol, buprenorphine, fentanyl, methadone. **Benzodiazepine medications include: diazepam (Valium), alprazolam (Xanax), clonazepam (Klonopine), lorazepam (Ativan), clorazepate (Tranxene), chlordiazepoxide (Librium), estazolam (Prosom), oxazepam (Serax),  temazepam (Restoril), triazolam (Halcion) (Last updated: 06/16/2020) ____________________________________________________________________________________________    

## 2021-01-09 ENCOUNTER — Telehealth: Payer: Self-pay

## 2021-01-09 NOTE — Telephone Encounter (Signed)
PA done, patient notified. 

## 2021-01-09 NOTE — Telephone Encounter (Signed)
Pt needs Auth for meds

## 2021-01-12 ENCOUNTER — Telehealth: Payer: Self-pay | Admitting: Cardiovascular Disease

## 2021-01-12 ENCOUNTER — Other Ambulatory Visit: Payer: Self-pay | Admitting: Cardiovascular Disease

## 2021-01-12 NOTE — Telephone Encounter (Signed)
-----   Message from Festus Aloe, CMA sent at 01/12/2021  4:26 PM EDT ----- Please contact patient for a follow up appointment.  The patient is requesting refills on her medications.  Thanks, Jasmine December

## 2021-01-12 NOTE — Telephone Encounter (Signed)
Called to schedule  No answer no VM  

## 2021-01-20 NOTE — Telephone Encounter (Signed)
Attempted to schedule no ans no vm  

## 2021-01-21 ENCOUNTER — Encounter: Payer: Medicaid Other | Admitting: Pain Medicine

## 2021-01-27 NOTE — Progress Notes (Deleted)
No transportation °

## 2021-01-28 ENCOUNTER — Encounter: Payer: Medicaid Other | Admitting: Pain Medicine
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
## 2021-02-11 ENCOUNTER — Ambulatory Visit: Payer: Medicaid Other | Attending: Pain Medicine | Admitting: Pain Medicine

## 2021-02-11 ENCOUNTER — Other Ambulatory Visit: Payer: Self-pay

## 2021-02-11 DIAGNOSIS — M25562 Pain in left knee: Secondary | ICD-10-CM

## 2021-02-11 DIAGNOSIS — M25551 Pain in right hip: Secondary | ICD-10-CM

## 2021-02-11 DIAGNOSIS — G894 Chronic pain syndrome: Secondary | ICD-10-CM | POA: Diagnosis not present

## 2021-02-11 DIAGNOSIS — Z79899 Other long term (current) drug therapy: Secondary | ICD-10-CM

## 2021-02-11 DIAGNOSIS — M545 Low back pain, unspecified: Secondary | ICD-10-CM

## 2021-02-11 DIAGNOSIS — Z79891 Long term (current) use of opiate analgesic: Secondary | ICD-10-CM | POA: Diagnosis not present

## 2021-02-11 DIAGNOSIS — G8929 Other chronic pain: Secondary | ICD-10-CM

## 2021-02-11 DIAGNOSIS — M25561 Pain in right knee: Secondary | ICD-10-CM

## 2021-02-11 DIAGNOSIS — M25552 Pain in left hip: Secondary | ICD-10-CM

## 2021-02-11 MED ORDER — MORPHINE SULFATE ER 15 MG PO TBCR: 15.0000 mg | EXTENDED_RELEASE_TABLET | Freq: Two times a day (BID) | ORAL | 0 refills | Status: DC

## 2021-02-15 DIAGNOSIS — M1712 Unilateral primary osteoarthritis, left knee: Secondary | ICD-10-CM | POA: Insufficient documentation
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
## 2021-03-17 ENCOUNTER — Encounter: Payer: Self-pay | Admitting: Pain Medicine

## 2021-03-17 ENCOUNTER — Ambulatory Visit: Payer: Medicaid Other | Attending: Pain Medicine | Admitting: Pain Medicine

## 2021-03-17 ENCOUNTER — Other Ambulatory Visit: Payer: Self-pay

## 2021-03-17 VITALS — BP 141/74 | HR 91 | Temp 96.9°F | Resp 18 | Wt 200.0 lb

## 2021-03-17 DIAGNOSIS — M25562 Pain in left knee: Secondary | ICD-10-CM | POA: Diagnosis present

## 2021-03-17 DIAGNOSIS — M4310 Spondylolisthesis, site unspecified: Secondary | ICD-10-CM | POA: Diagnosis present

## 2021-03-17 DIAGNOSIS — Z79891 Long term (current) use of opiate analgesic: Secondary | ICD-10-CM | POA: Insufficient documentation

## 2021-03-17 DIAGNOSIS — M25561 Pain in right knee: Secondary | ICD-10-CM | POA: Diagnosis present

## 2021-03-17 DIAGNOSIS — G8929 Other chronic pain: Secondary | ICD-10-CM | POA: Diagnosis present

## 2021-03-17 DIAGNOSIS — G609 Hereditary and idiopathic neuropathy, unspecified: Secondary | ICD-10-CM | POA: Insufficient documentation

## 2021-03-17 DIAGNOSIS — G894 Chronic pain syndrome: Secondary | ICD-10-CM | POA: Diagnosis present

## 2021-03-17 DIAGNOSIS — M25552 Pain in left hip: Secondary | ICD-10-CM | POA: Insufficient documentation

## 2021-03-17 DIAGNOSIS — M79675 Pain in left toe(s): Secondary | ICD-10-CM | POA: Diagnosis present

## 2021-03-17 DIAGNOSIS — Z79899 Other long term (current) drug therapy: Secondary | ICD-10-CM | POA: Diagnosis present

## 2021-03-17 DIAGNOSIS — M545 Low back pain, unspecified: Secondary | ICD-10-CM | POA: Insufficient documentation

## 2021-03-17 DIAGNOSIS — M79674 Pain in right toe(s): Secondary | ICD-10-CM | POA: Diagnosis present

## 2021-03-17 DIAGNOSIS — M25551 Pain in right hip: Secondary | ICD-10-CM | POA: Diagnosis present

## 2021-03-17 DIAGNOSIS — M542 Cervicalgia: Secondary | ICD-10-CM | POA: Insufficient documentation

## 2021-03-17 MED ORDER — MORPHINE SULFATE ER 15 MG PO TBCR: 15.0000 mg | EXTENDED_RELEASE_TABLET | Freq: Two times a day (BID) | ORAL | 0 refills | Status: DC

## 2021-03-17 NOTE — Patient Instructions (Signed)
____________________________________________________________________________________________  Medication Rules  Purpose: To inform patients, and their family members, of our rules and regulations.  Applies to: All patients receiving prescriptions (written or electronic).  Pharmacy of record: Pharmacy where electronic prescriptions will be sent. If written prescriptions are taken to a different pharmacy, please inform the nursing staff. The pharmacy listed in the electronic medical record should be the one where you would like electronic prescriptions to be sent.  Electronic prescriptions: In compliance with the [place]reng[place] Opioid Misuse Prevention (STOP) Act of 2017 (Session Law 2017-74/H243), effective July 19, 2018, all controlled substances must be electronically prescribed. Calling prescriptions to the pharmacy will cease to exist.  Prescription refills: Only during scheduled appointments. Applies to all prescriptions.  NOTE: The following applies primarily to controlled substances (Opioid* Pain Medications).   Type of encounter (visit): For patients receiving controlled substances, face-to-face visits are required. (Not an option or up to the patient.)  Patient's responsibilities: Pain Pills: Bring all pain pills to every appointment (except for procedure appointments). Pill Bottles: Bring pills in original pharmacy bottle. Always bring the newest bottle. Bring bottle, even if empty. Medication refills: You are responsible for knowing and keeping track of what medications you take and those you need refilled. The day before your appointment: write a list of all prescriptions that need to be refilled. The day of the appointment: give the list to the admitting nurse. Prescriptions will be written only during appointments. No prescriptions will be written on procedure days. If you forget a medication: it will not be "Called in", "Faxed", or "electronically sent". You will  need to get another appointment to get these prescribed. No early refills. Do not call asking to have your prescription filled early. Prescription Accuracy: You are responsible for carefully inspecting your prescriptions before leaving our office. Have the discharge nurse carefully go over each prescription with you, before taking them home. Make sure that your name is accurately spelled, that your address is correct. Check the name and dose of your medication to make sure it is accurate. Check the number of pills, and the written instructions to make sure they are clear and accurate. Make sure that you are given enough medication to last until your next medication refill appointment. Taking Medication: Take medication as prescribed. When it comes to controlled substances, taking less pills or less frequently than prescribed is permitted and encouraged. Never take more pills than instructed. Never take medication more frequently than prescribed.  Inform other Doctors: Always inform, all of your healthcare providers, of all the medications you take. Pain Medication from other Providers: You are not allowed to accept any additional pain medication from any other Doctor or Healthcare provider. There are two exceptions to this rule. (see below) In the event that you require additional pain medication, you are responsible for notifying us, as stated below. Cough Medicine: Often these contain an opioid, such as codeine or hydrocodone. Never accept or take cough medicine containing these opioids if you are already taking an opioid* medication. The combination may cause respiratory failure and death. Medication Agreement: You are responsible for carefully reading and following our Medication Agreement. This must be signed before receiving any prescriptions from our practice. Safely store a copy of your signed Agreement. Violations to the Agreement will result in no further prescriptions. (Additional copies of our  Medication Agreement are available upon request.) Laws, Rules, & Regulations: All patients are expected to follow all Federal and State Laws, Statutes, Rules, & Regulations. Ignorance of   the Laws does not constitute a valid excuse.  Illegal drugs and Controlled Substances: The use of illegal substances (including, but not limited to marijuana and its derivatives) and/or the illegal use of any controlled substances is strictly prohibited. Violation of this rule may result in the immediate and permanent discontinuation of any and all prescriptions being written by our practice. The use of any illegal substances is prohibited. Adopted CDC guidelines & recommendations: Target dosing levels will be at or below 60 MME/day. Use of benzodiazepines** is not recommended.  Exceptions: There are only two exceptions to the rule of not receiving pain medications from other Healthcare Providers. Exception #1 (Emergencies): In the event of an emergency (i.e.: accident requiring emergency care), you are allowed to receive additional pain medication. However, you are responsible for: As soon as you are able, call our office [phone], at any time of the day or night, and leave a message stating your name, the date and nature of the emergency, and the name and dose of the medication prescribed. In the event that your call is answered by a member of our staff, make sure to document and save the date, time, and the name of the person that took your information.  Exception #2 (Planned Surgery): In the event that you are scheduled by another doctor or dentist to have any type of surgery or procedure, you are allowed (for a period no longer than 30 days), to receive additional pain medication, for the acute post-op pain. However, in this case, you are responsible for picking up a copy of our "Post-op Pain Management for Surgeons" handout, and giving it to your surgeon or dentist. This document is available at our office, and  does not require an appointment to obtain it. Simply go to our office during business hours (Monday-Thursday from 8:00 AM to 4:00 PM) (Friday 8:00 AM to 12:00 Noon) or if you have a scheduled appointment with us, prior to your surgery, and ask for it by name. In addition, you are responsible for: calling our office [phone], at any time of the day or night, and leaving a message stating your name, name of your surgeon, type of surgery, and date of procedure or surgery. Failure to comply with your responsibilities may result in termination of therapy involving the controlled substances.  *Opioid medications include: morphine, codeine, oxycodone, oxymorphone, hydrocodone, hydromorphone, meperidine, tramadol, tapentadol, buprenorphine, fentanyl, methadone. **Benzodiazepine medications include: diazepam (Valium), alprazolam (Xanax), clonazepam (Klonopine), lorazepam (Ativan), clorazepate (Tranxene), chlordiazepoxide (Librium), estazolam (Prosom), oxazepam (Serax), temazepam (Restoril), triazolam (Halcion) (Last updated: 06/16/2020) ____________________________________________________________________________________________  ____________________________________________________________________________________________  Medication Recommendations and Reminders  Applies to: All patients receiving prescriptions (written and/or electronic).  Medication Rules & Regulations: These rules and regulations exist for your safety and that of others. They are not flexible and neither are we. Dismissing or ignoring them will be considered "non-compliance" with medication therapy, resulting in complete and irreversible termination of such therapy. (See document titled "Medication Rules" for more details.) In all conscience, because of safety reasons, we cannot continue providing a therapy where the patient does not follow instructions.  Pharmacy of record:  Definition: This is the pharmacy where your electronic  prescriptions will be sent.  We do not endorse any particular pharmacy, however, we have experienced problems with [facility] not securing enough medication supply for the community. We do not restrict you in your choice of pharmacy. However, once we write for your prescriptions, we will NOT be re-sending more prescriptions to fix restricted supply problems   created by your pharmacy, or your insurance.  The pharmacy listed in the electronic medical record should be the one where you want electronic prescriptions to be sent. If you choose to change pharmacy, simply notify our nursing staff.  Recommendations: Keep all of your pain medications in a safe place, under lock and key, even if you live alone. We will NOT replace lost, stolen, or damaged medication. After you fill your prescription, take 1 week's worth of pills and put them away in a safe place. You should keep a separate, properly labeled bottle for this purpose. The remainder should be kept in the original bottle. Use this as your primary supply, until it runs out. Once it's gone, then you know that you have 1 week's worth of medicine, and it is time to come in for a prescription refill. If you do this correctly, it is unlikely that you will ever run out of medicine. To make sure that the above recommendation works, it is very important that you make sure your medication refill appointments are scheduled at least 1 week before you run out of medicine. To do this in an effective manner, make sure that you do not leave the office without scheduling your next medication management appointment. Always ask the nursing staff to show you in your prescription , when your medication will be running out. Then arrange for the receptionist to get you a return appointment, at least 7 days before you run out of medicine. Do not wait until you have 1 or 2 pills left, to come in. This is very poor planning and does not take into consideration that we may need to  cancel appointments due to bad weather, sickness, or emergencies affecting our staff. DO NOT ACCEPT A "Partial Fill": If for any reason your pharmacy does not have enough pills/tablets to completely fill or refill your prescription, do not allow for a "partial fill". The law allows the pharmacy to complete that prescription within 72 hours, without requiring a new prescription. If they do not fill the rest of your prescription within those 72 hours, you will need a separate prescription to fill the remaining amount, which we will NOT provide. If the reason for the partial fill is your insurance, you will need to talk to the pharmacist about payment alternatives for the remaining tablets, but again, DO NOT ACCEPT A PARTIAL FILL, unless you can trust your pharmacist to obtain the remainder of the pills within 72 hours.  Prescription refills and/or changes in medication(s):  Prescription refills, and/or changes in dose or medication, will be conducted only during scheduled medication management appointments. (Applies to both, written and electronic prescriptions.) No refills on procedure days. No medication will be changed or started on procedure days. No changes, adjustments, and/or refills will be conducted on a procedure day. Doing so will interfere with the diagnostic portion of the procedure. No phone refills. No medications will be "called into the pharmacy". No Fax refills. No weekend refills. No Holliday refills. No after hours refills.  Remember:  Business hours are:  Monday to Thursday 8:00 AM to 4:00 PM Provider's Schedule: Shellie Rogoff, MD - Appointments are:  Medication management: Monday and Wednesday 8:00 AM to 4:00 PM Procedure day: Tuesday and Thursday 7:30 AM to 4:00 PM Bilal Lateef, MD - Appointments are:  Medication management: Tuesday and Thursday 8:00 AM to 4:00 PM Procedure day: Monday and Wednesday 7:30 AM to 4:00 PM (Last update:  02/06/2020) ____________________________________________________________________________________________  ____________________________________________________________________________________________  CBD (cannabidiol) WARNING    Applicable to: All individuals currently taking or considering taking CBD (cannabidiol) and, more important, all patients taking opioid analgesic controlled substances (pain medication). (Example: oxycodone; oxymorphone; hydrocodone; hydromorphone; morphine; methadone; tramadol; tapentadol; fentanyl; buprenorphine; butorphanol; dextromethorphan; meperidine; codeine; etc.)  Legal status: CBD remains a Schedule I drug prohibited for any use. CBD is illegal with one exception. In the [place], CBD has a limited Food and Drug Administration (FDA) approval for the treatment of two specific types of epilepsy disorders. Only one CBD product has been approved by the FDA for this purpose: "Epidiolex". FDA is aware that some companies are marketing products containing cannabis and cannabis-derived compounds in ways that violate the Federal Food, Drug and Cosmetic Act (FD&C Act) and that may put the health and safety of consumers at risk. The FDA, a Federal agency, has not enforced the CBD status since 2018.   Legality: Some manufacturers ship CBD products nationally, which is illegal. Often such products are sold online and are therefore available throughout the country. CBD is openly sold in head shops and health food stores in some states where such sales have not been explicitly legalized. Selling unapproved products with unsubstantiated therapeutic claims is not only a violation of the law, but also can put patients at risk, as these products have not been proven to be safe or effective. Federal illegality makes it difficult to conduct research on CBD.  Reference: "FDA Regulation of Cannabis and Cannabis-Derived Products, Including Cannabidiol (CBD)" -  https://www.fda.gov/news-events/public-health-focus/fda-regulation-cannabis-and-cannabis-derived-products-including-cannabidiol-cbd  Warning: CBD is not FDA approved and has not undergo the same manufacturing controls as prescription drugs.  This means that the purity and safety of available CBD may be questionable. Most of the time, despite manufacturer's claims, it is contaminated with THC (delta-9-tetrahydrocannabinol - the chemical in marijuana responsible for the "HIGH").  When this is the case, the THC contaminant will trigger a positive urine drug screen (UDS) test for Marijuana (carboxy-THC). Because a positive UDS for any illicit substance is a violation of our medication agreement, your opioid analgesics (pain medicine) may be permanently discontinued.  MORE ABOUT CBD  General Information: CBD  is a derivative of the Marijuana (cannabis sativa) plant discovered in 1940. It is one of the 113 identified substances found in Marijuana. It accounts for up to 40% of the plant's extract. As of 2018, preliminary clinical studies on CBD included research for the treatment of anxiety, movement disorders, and pain. CBD is available and consumed in multiple forms, including inhalation of smoke or vapor, as an aerosol spray, and by mouth. It may be supplied as an oil containing CBD, capsules, dried cannabis, or as a liquid solution. CBD is thought not to be as psychoactive as THC (delta-9-tetrahydrocannabinol - the chemical in marijuana responsible for the "HIGH"). Studies suggest that CBD may interact with different biological target receptors in the body, including cannabinoid and other neurotransmitter receptors. As of 2018 the mechanism of action for its biological effects has not been determined.  Side-effects  Adverse reactions: Dry mouth, diarrhea, decreased appetite, fatigue, drowsiness, malaise, weakness, sleep disturbances, and others.  Drug interactions: CBC may interact with other medications  such as blood-thinners. (Last update: 02/23/2020) ____________________________________________________________________________________________  ____________________________________________________________________________________________  Drug Holidays (Slow)  What is a "Drug Holiday"? Drug Holiday: is the name given to the period of time during which a patient stops taking a medication(s) for the purpose of eliminating tolerance to the drug.  Benefits Improved effectiveness of opioids. Decreased opioid dose needed to achieve benefits. Improved pain with lesser dose.    What is tolerance? Tolerance: is the progressive decreased in effectiveness of a drug due to its repetitive use. With repetitive use, the body gets use to the medication and as a consequence, it loses its effectiveness. This is a common problem seen with opioid pain medications. As a result, a larger dose of the drug is needed to achieve the same effect that used to be obtained with a smaller dose.  How long should a "Drug Holiday" last? You should stay off of the pain medicine for at least 14 consecutive days. (2 weeks)  Should I stop the medicine "cold turkey"? No. You should always coordinate with your Pain Specialist so that he/she can provide you with the correct medication dose to make the transition as smoothly as possible.  How do I stop the medicine? Slowly. You will be instructed to decrease the daily amount of pills that you take by one (1) pill every seven (7) days. This is called a "slow downward taper" of your dose. For example: if you normally take four (4) pills per day, you will be asked to drop this dose to three (3) pills per day for seven (7) days, then to two (2) pills per day for seven (7) days, then to one (1) per day for seven (7) days, and at the end of those last seven (7) days, this is when the "Drug Holiday" would start.   Will I have withdrawals? By doing a "slow downward taper" like this one, it  is unlikely that you will experience any significant withdrawal symptoms. Typically, what triggers withdrawals is the sudden stop of a high dose opioid therapy. Withdrawals can usually be avoided by slowly decreasing the dose over a prolonged period of time. If you do not follow these instructions and decide to stop your medication abruptly, withdrawals may be possible.  What are withdrawals? Withdrawals: refers to the wide range of symptoms that occur after stopping or dramatically reducing opiate drugs after heavy and prolonged use. Withdrawal symptoms do not occur to patients that use low dose opioids, or those who take the medication sporadically. Contrary to benzodiazepine (example: Valium, Xanax, etc.) or alcohol withdrawals ("Delirium Tremens"), opioid withdrawals are not lethal. Withdrawals are the physical manifestation of the body getting rid of the excess receptors.  Expected Symptoms Early symptoms of withdrawal may include: Agitation Anxiety Muscle aches Increased tearing Insomnia Runny nose Sweating Yawning  Late symptoms of withdrawal may include: Abdominal cramping Diarrhea Dilated pupils Goose bumps Nausea Vomiting  Will I experience withdrawals? Due to the slow nature of the taper, it is very unlikely that you will experience any.  What is a slow taper? Taper: refers to the gradual decrease in dose.  (Last update: 02/06/2020) ____________________________________________________________________________________________    

## 2021-03-17 NOTE — Progress Notes (Signed)
Safety precautions to be maintained throughout the outpatient stay will include: orient to surroundings, keep bed in low position, maintain call bell within reach at all times, provide assistance with transfer out of bed and ambulation.   Nursing Pain Medication Assessment:  Safety precautions to be maintained throughout the outpatient stay will include: orient to surroundings, keep bed in low position, maintain call bell within reach at all times, provide assistance with transfer out of bed and ambulation.  Medication Inspection Compliance: Pill count conducted under aseptic conditions, in front of the patient. Neither the pills nor the bottle was removed from the patient's sight at any time. Once count was completed pills were immediately returned to the patient in their original bottle.  Medication: Morphine ER (MSContin) Pill/Patch Count:  0 of 60 pills remain Pill/Patch Appearance: Markings consistent with prescribed medication Bottle Appearance: Standard pharmacy container. Clearly labeled. Filled Date: 07 / 27 / 2022 Last Medication intake:  Ran out of medicine more than 48 hours ago

## 2021-03-19 NOTE — Telephone Encounter (Signed)
Attempted to schedule no ans no vm  

## 2021-03-21 LAB — TOXASSURE SELECT 13 (MW), URINE

## 2021-04-19 ENCOUNTER — Emergency Department
Admission: EM | Admit: 2021-04-19 | Discharge: 2021-04-19 | Disposition: A | Discharge status: Home / Self Care | Payer: Medicaid Other | Attending: Emergency Medicine | Admitting: Emergency Medicine

## 2021-04-19 ENCOUNTER — Other Ambulatory Visit: Payer: Self-pay

## 2021-04-19 ENCOUNTER — Encounter: Payer: Self-pay | Admitting: Internal Medicine

## 2021-04-19 DIAGNOSIS — R55 Syncope and collapse: Secondary | ICD-10-CM | POA: Insufficient documentation

## 2021-04-19 DIAGNOSIS — Z5321 Procedure and treatment not carried out due to patient leaving prior to being seen by health care provider: Secondary | ICD-10-CM | POA: Insufficient documentation

## 2021-04-19 LAB — BASIC METABOLIC PANEL
Anion gap: 11 (ref 5–15)
BUN: 6 mg/dL (ref 6–20)
CO2: 22 mmol/L (ref 22–32)
Calcium: 9.4 mg/dL (ref 8.9–10.3)
Chloride: 105 mmol/L (ref 98–111)
Creatinine, Ser: 0.86 mg/dL (ref 0.44–1.00)
GFR, Estimated: >60 mL/min (ref >60)
Glucose, Bld: 224 mg/dL — ABNORMAL HIGH (ref 70–99)
Potassium: 3.8 mmol/L (ref 3.5–5.1)
Sodium: 138 mmol/L (ref 135–145)

## 2021-04-19 LAB — CBC
HCT: 44.4 % (ref 36.0–46.0)
Hemoglobin: 15.1 g/dL — ABNORMAL HIGH (ref 12.0–15.0)
MCH: 34 pg (ref 26.0–34.0)
MCHC: 34 g/dL (ref 30.0–36.0)
MCV: 100 fL (ref 80.0–100.0)
Platelets: 261 10*3/uL (ref 150–400)
RBC: 4.44 MIL/uL (ref 3.87–5.11)
RDW: 15.2 % (ref 11.5–15.5)
WBC: 10 10*3/uL (ref 4.0–10.5)
nRBC: 0 % (ref 0.0–0.2)
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
## 2021-05-20 ENCOUNTER — Other Ambulatory Visit: Payer: Self-pay | Admitting: Cardiovascular Disease

## 2021-05-22 ENCOUNTER — Other Ambulatory Visit: Payer: Self-pay

## 2021-05-22 MED ORDER — DILTIAZEM HCL ER COATED BEADS 120 MG PO CP24: ORAL_CAPSULE | ORAL | 0 refills | Status: DC

## 2021-05-22 NOTE — Telephone Encounter (Signed)
Patient needs a follow up appointment. Spoke with the patient regarding a refill and a follow up appointment. The patient stated, she will need to call back when she gets home because she will need to look at the calendar before making an an appointment.

## 2021-05-22 NOTE — Telephone Encounter (Signed)
*  STAT* If patient is at the pharmacy, call can be transferred to refill team.   1. Which medications need to be refilled? (please list name of each medication and dose if known) Dialtiazem  2. Which pharmacy/location (including street and city if local pharmacy) is medication to be sent to? [facility] 3. Do they need a 30 day or 90 day supply? 30
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
## 2021-06-15 ENCOUNTER — Ambulatory Visit: Payer: Medicaid Other | Attending: Pain Medicine | Admitting: Pain Medicine

## 2021-06-15 ENCOUNTER — Other Ambulatory Visit: Payer: Self-pay

## 2021-06-15 ENCOUNTER — Encounter: Payer: Self-pay | Admitting: Pain Medicine

## 2021-06-15 DIAGNOSIS — M533 Sacrococcygeal disorders, not elsewhere classified: Secondary | ICD-10-CM

## 2021-06-15 DIAGNOSIS — M25551 Pain in right hip: Secondary | ICD-10-CM | POA: Diagnosis not present

## 2021-06-15 DIAGNOSIS — M25561 Pain in right knee: Secondary | ICD-10-CM

## 2021-06-15 DIAGNOSIS — M79674 Pain in right toe(s): Secondary | ICD-10-CM

## 2021-06-15 DIAGNOSIS — M545 Low back pain, unspecified: Secondary | ICD-10-CM | POA: Diagnosis not present

## 2021-06-15 DIAGNOSIS — G8929 Other chronic pain: Secondary | ICD-10-CM

## 2021-06-15 DIAGNOSIS — M5030 Other cervical disc degeneration, unspecified cervical region: Secondary | ICD-10-CM

## 2021-06-15 DIAGNOSIS — Z79899 Other long term (current) drug therapy: Secondary | ICD-10-CM

## 2021-06-15 DIAGNOSIS — M25552 Pain in left hip: Secondary | ICD-10-CM

## 2021-06-15 DIAGNOSIS — M79641 Pain in right hand: Secondary | ICD-10-CM

## 2021-06-15 DIAGNOSIS — M7918 Myalgia, other site: Secondary | ICD-10-CM

## 2021-06-15 DIAGNOSIS — M549 Dorsalgia, unspecified: Secondary | ICD-10-CM

## 2021-06-15 DIAGNOSIS — M5137 Other intervertebral disc degeneration, lumbosacral region: Secondary | ICD-10-CM | POA: Diagnosis not present

## 2021-06-15 DIAGNOSIS — M79675 Pain in left toe(s): Secondary | ICD-10-CM

## 2021-06-15 DIAGNOSIS — G894 Chronic pain syndrome: Secondary | ICD-10-CM

## 2021-06-15 DIAGNOSIS — M542 Cervicalgia: Secondary | ICD-10-CM

## 2021-06-15 DIAGNOSIS — Z79891 Long term (current) use of opiate analgesic: Secondary | ICD-10-CM

## 2021-06-15 DIAGNOSIS — M79642 Pain in left hand: Secondary | ICD-10-CM

## 2021-06-15 DIAGNOSIS — M25562 Pain in left knee: Secondary | ICD-10-CM

## 2021-06-15 MED ORDER — MORPHINE SULFATE ER 15 MG PO TBCR: 15.0000 mg | EXTENDED_RELEASE_TABLET | Freq: Two times a day (BID) | ORAL | 0 refills | Status: DC

## 2021-07-07 NOTE — Progress Notes (Deleted)
Rescheduled

## 2021-07-08 ENCOUNTER — Ambulatory Visit (HOSPITAL_BASED_OUTPATIENT_CLINIC_OR_DEPARTMENT_OTHER): Payer: Medicaid Other | Admitting: Pain Medicine

## 2021-07-08 DIAGNOSIS — G894 Chronic pain syndrome: Secondary | ICD-10-CM
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
## 2021-07-14 ENCOUNTER — Encounter: Payer: Self-pay | Admitting: Pain Medicine

## 2021-07-14 ENCOUNTER — Other Ambulatory Visit: Payer: Self-pay

## 2021-07-14 ENCOUNTER — Ambulatory Visit: Payer: Medicaid Other | Attending: Pain Medicine | Admitting: Pain Medicine

## 2021-07-14 DIAGNOSIS — M25561 Pain in right knee: Secondary | ICD-10-CM | POA: Diagnosis present

## 2021-07-14 DIAGNOSIS — M7918 Myalgia, other site: Secondary | ICD-10-CM | POA: Diagnosis present

## 2021-07-14 DIAGNOSIS — Z79899 Other long term (current) drug therapy: Secondary | ICD-10-CM | POA: Diagnosis present

## 2021-07-14 DIAGNOSIS — M542 Cervicalgia: Secondary | ICD-10-CM | POA: Insufficient documentation

## 2021-07-14 DIAGNOSIS — M79675 Pain in left toe(s): Secondary | ICD-10-CM | POA: Diagnosis present

## 2021-07-14 DIAGNOSIS — M79674 Pain in right toe(s): Secondary | ICD-10-CM | POA: Insufficient documentation

## 2021-07-14 DIAGNOSIS — Z79891 Long term (current) use of opiate analgesic: Secondary | ICD-10-CM | POA: Diagnosis present

## 2021-07-14 DIAGNOSIS — M25551 Pain in right hip: Secondary | ICD-10-CM | POA: Insufficient documentation

## 2021-07-14 DIAGNOSIS — M79641 Pain in right hand: Secondary | ICD-10-CM

## 2021-07-14 DIAGNOSIS — M549 Dorsalgia, unspecified: Secondary | ICD-10-CM | POA: Diagnosis present

## 2021-07-14 DIAGNOSIS — M25552 Pain in left hip: Secondary | ICD-10-CM | POA: Diagnosis present

## 2021-07-14 DIAGNOSIS — G8929 Other chronic pain: Secondary | ICD-10-CM | POA: Diagnosis present

## 2021-07-14 DIAGNOSIS — M533 Sacrococcygeal disorders, not elsewhere classified: Secondary | ICD-10-CM | POA: Diagnosis present

## 2021-07-14 DIAGNOSIS — M51379 Other intervertebral disc degeneration, lumbosacral region without mention of lumbar back pain or lower extremity pain: Secondary | ICD-10-CM

## 2021-07-14 DIAGNOSIS — M5030 Other cervical disc degeneration, unspecified cervical region: Secondary | ICD-10-CM | POA: Diagnosis present

## 2021-07-14 DIAGNOSIS — M545 Low back pain, unspecified: Secondary | ICD-10-CM | POA: Diagnosis present

## 2021-07-14 DIAGNOSIS — M5137 Other intervertebral disc degeneration, lumbosacral region: Secondary | ICD-10-CM | POA: Insufficient documentation

## 2021-07-14 DIAGNOSIS — M79642 Pain in left hand: Secondary | ICD-10-CM | POA: Insufficient documentation

## 2021-07-14 DIAGNOSIS — M25562 Pain in left knee: Secondary | ICD-10-CM | POA: Insufficient documentation

## 2021-07-14 DIAGNOSIS — G894 Chronic pain syndrome: Secondary | ICD-10-CM | POA: Diagnosis present

## 2021-07-14 MED ORDER — MORPHINE SULFATE ER 15 MG PO TBCR: 15.0000 mg | EXTENDED_RELEASE_TABLET | Freq: Two times a day (BID) | ORAL | 0 refills | Status: DC

## 2021-07-14 MED ORDER — MORPHINE SULFATE ER 15 MG PO TBCR
15.0000 mg | EXTENDED_RELEASE_TABLET | Freq: Two times a day (BID) | ORAL | 0 refills | Status: DC
Start: 2021-09-13 — End: 2021-09-28

## 2021-07-14 NOTE — Progress Notes (Signed)
Nursing Pain Medication Assessment:  Safety precautions to be maintained throughout the outpatient stay will include: orient to surroundings, keep bed in low position, maintain call bell within reach at all times, provide assistance with transfer out of bed and ambulation.  Medication Inspection Compliance: Pill count conducted under aseptic conditions, in front of the patient. Neither the pills nor the bottle was removed from the patient's sight at any time. Once count was completed pills were immediately returned to the patient in their original bottle.  Medication: Morphine ER (MSContin) Pill/Patch Count:  3 of 60 pills remain Pill/Patch Appearance: Markings consistent with prescribed medication Bottle Appearance: Standard pharmacy container. Clearly labeled. Filled Date: 76 / 28 / 2022 Last Medication intake:  Today

## 2021-07-23 ENCOUNTER — Other Ambulatory Visit: Payer: Self-pay | Admitting: Cardiovascular Disease

## 2021-08-02 NOTE — Progress Notes (Deleted)
Virtual Visit via Video Note   This visit type was conducted due to national recommendations for restrictions regarding the COVID-19 Pandemic (e.g. social distancing) in an effort to limit this patient's exposure and mitigate transmission in our community.  Due to her co-morbid illnesses, this patient is at least at moderate risk for complications without adequate follow up.  This format is felt to be most appropriate for this patient at this time.  All issues noted in this document were discussed and addressed.  A limited physical exam was performed with this format.  Please refer to the patient's chart for her consent to telehealth for [facility].    Date:  08/02/2021   ID:  Erica [name], DOB [birth year], MRN [id number]  Patient Location:  [place] [name] [id number]   Provider location:   [facility], [place] office  PCP:  Denton Lank, MD  Cardiologist:  [facility]  Chief Complaint:  SOB   History of Present Illness:    Erica [name] is a 56 y.o. female who presents via audio/video conferencing for a telehealth visit today.   The patient does not symptoms concerning for COVID-19 infection (fever, chills, cough, or new SHORTNESS OF BREATH).   Patient has a past medical history of 56 y.o. female with h/o  smoking, COPD, on inhalers, quit 04/2016 medication noncompliance secondary to financial and insurance issues,  chronic thrush,  chronic back pain   hospital  November 2016 with shortness of breath, pneumonia,  echocardiogram showing normal LV function, EF 60%,  elevated right heart pressures,   sodium around 112, abnormal troponin, long hospital course CT 06/2015, No significant CAD, PAD, minimal in aortic arch She presents today for follow-up of her chronic diastolic CHF  Recently in the hospital February 5537 Alcoholic ketoacidosis Acute alcoholic gastritis  Had chest pain Acute hypokalemia, potassium 2.2, up to 3.8 Phos 1.1,  mag 1.3 Hospital records reviewed with the patient in detail Atypical chest pain: Her EKG is normal and echocardiogram was unremarkable.  HBA1C 6.0  Previously on losartan,  BP "ran slow", better with losartan   In follow-up today she reports that she is doing well May have broken left foot small toe, hit a recliner with her foot by accident   Leg swelling has essentially resolved Taking lasix 40 daily Sometimes extra lasix 20 in the PM She's moderating her fluid intake     Reports that she quit smoking October 2017 Occasionally breaks down has cigarette      Other past medical history reviewed weight gain, worsening shortness of breath Seen in the ER 06/14/16 with shortness of breath symptoms, given IV Lasix   Previously had several Prednisone tapers, most recently in jan 2018,  OSA, does not use CPAP. Had problems with anxiety   Continues to take Advair, spiriva and proair Problems with thrush, takes Diflucan 150 mg 1 as needed    CT scan chest  Oct 2017   Previous CT 06/2015 No significant CAD, PAD, minimal in aortic arch Images reviewed with her in detail    Recent EKG reviewed from 08/31/2016 when she saw CHF clinic showing normal sinus rhythm rate 66 bpm no significant ST or T-wave changes    Prior CV studies:   The following studies were reviewed today:  Echo 09/11/2018  1. The left ventricle has normal systolic function, with an ejection fraction of 55-60%. The cavity size was normal. There is mild concentric left ventricular hypertrophy. Left ventricular  diastolic Doppler parameters are consistent with impaired  relaxation.  2. The right ventricle has normal systolic function. The cavity was normal. There is no increase in right ventricular wall thickness.  3. The mitral valve is normal in structure.  4. The tricuspid valve is normal in structure.  5. The aortic valve is tricuspid.  6. The pulmonic valve was normal in structure.   Past Medical  History:  Diagnosis Date   (HFpEF) heart failure with preserved ejection fraction ([place])    a. 05/2015 Echo: EF 60-65%, no rwma, PASP 58mHg.   Acute pancreatitis 08/13/2018   Arthritis    Asthma    Bell's palsy    Bronchitis    CHF (congestive heart failure) (HCC)    COPD (chronic obstructive pulmonary disease) ([facility]    Diabetes mellitus without complication ([facility]    type II 03/2017   Gastric ulcer    Hyperlipidemia    Hypertension    OSA (obstructive sleep apnea)    a. did not tolerate CPAP.   Pancreatitis    07/2018   Shoulder injury    6/19   Past Surgical History:  Procedure Laterality Date   FLEXIBLE BRONCHOSCOPY N/A 06/20/2015   Procedure: FLEXIBLE BRONCHOSCOPY;  Surgeon: PLaverle Hobby MD;  Location: [facility] ORS;  Service: Pulmonary;  Laterality: N/A;   KNEE SURGERY Left    8 knee surgeries     No outpatient medications have been marked as taking for the 08/03/21 encounter (Appointment) with GMinna Merritts MD.     Allergies:   Tramadol and Penicillins   Social History   Tobacco Use   Smoking status: Every Day    Packs/day: 1.00    Years: 36.00    Pack years: 36.00    Types: Cigarettes    Last attempt to quit: 06/13/2015    Years since quitting: 6.1   Smokeless tobacco: Never  Vaping Use   Vaping Use: Never used  Substance Use Topics   Alcohol use: Yes    Comment: occasionally   Drug use: No     Current Outpatient Medications on File Prior to Visit  Medication Sig Dispense Refill   albuterol (VENTOLIN HFA) 108 (90 Base) MCG/ACT inhaler Inhale 2 puffs into the lungs every 6 (six) hours as needed for wheezing or shortness of breath.      aspirin EC 81 MG EC tablet Take 1 tablet (81 mg total) by mouth daily. 30 tablet 0   atorvastatin (LIPITOR) 80 MG tablet Take 1 tablet (80 mg total) by mouth daily. 90 tablet 3   Blood Glucose Monitoring Suppl (ACCU-CHEK AVIVA PLUS) w/Device KIT See admin instructions.  0   busPIRone (BUSPAR) 10 MG tablet Take 20  mg by mouth 2 (two) times daily.     Cholecalciferol (VITAMIN D3) 50 MCG (2000 UT) capsule Take 1 capsule (2,000 Units total) by mouth daily. Take 1 capsule (2,000 Units total) by mouth daily. 30 capsule 2   clonazePAM (KLONOPIN) 0.5 MG tablet Take 0.5 mg by mouth at bedtime as needed.     diltiazem (CARDIZEM CD) 120 MG 24 hr capsule TAKE 1 CAPSULE BY MOUTH EVERY DAY 30 capsule 0   diltiazem (CARDIZEM) 30 MG tablet TAKE 1 TABLET(30 MG) BY MOUTH THREE TIMES DAILY AS NEEDED 90 tablet 0   DULoxetine (CYMBALTA) 60 MG capsule Take 60 mg by mouth 2 (two) times daily.      Fluticasone-Salmeterol (ADVAIR) 250-50 MCG/DOSE AEPB Inhale 1 puff into the lungs 2 (two) times daily.  furosemide (LASIX) 40 MG tablet TAKE 1 TABLET BY MOUTH DAILY TAKE AN ADDITIONAL TABLET IN THE MORNING FOR WEIGHT GAIN AS NEEDED 180 tablet 1   Magnesium 500 MG CAPS once daily.     metFORMIN (GLUCOPHAGE) 500 MG tablet Take 1 tablet (500 mg total) by mouth 2 (two) times daily with a meal. (Patient taking differently: Take 1,000 mg by mouth 2 (two) times daily with a meal.) 30 tablet 0   morphine (MS CONTIN) 15 MG 12 hr tablet Take 1 tablet (15 mg total) by mouth every 12 (twelve) hours. Must last 30 days. Do not break tablet 60 tablet 0   [START ON 08/14/2021] morphine (MS CONTIN) 15 MG 12 hr tablet Take 1 tablet (15 mg total) by mouth every 12 (twelve) hours. Must last 30 days. Do not break tablet 60 tablet 0   [START ON 09/13/2021] morphine (MS CONTIN) 15 MG 12 hr tablet Take 1 tablet (15 mg total) by mouth every 12 (twelve) hours. Must last 30 days. Do not break tablet 60 tablet 0   OXYGEN Inhale 2 L into the lungs at bedtime.      pantoprazole (PROTONIX) 40 MG tablet Take 1 tablet (40 mg total) by mouth daily. 30 tablet 0   potassium chloride SA (KLOR-CON) 20 MEQ tablet Take 20 mEq by mouth 2 (two) times daily.     pregabalin (LYRICA) 150 MG capsule Take 1 capsule (150 mg total) by mouth 3 (three) times daily. 90 capsule 2    spironolactone (ALDACTONE) 25 MG tablet Take 25 mg by mouth every morning.     sucralfate (CARAFATE) 1 G tablet Take 1 tablet (1 g total) by mouth 4 (four) times daily -  with meals and at bedtime. (Patient taking differently: Take 1 g by mouth 2 (two) times daily.) 120 tablet 0   tiotropium (SPIRIVA) 18 MCG inhalation capsule Place 18 mcg into inhaler and inhale daily.     tiZANidine (ZANAFLEX) 4 MG capsule Take 1 capsule (4 mg total) by mouth 3 (three) times daily as needed for muscle spasms. 90 capsule 2   zolpidem (AMBIEN) 5 MG tablet Take 5 mg by mouth at bedtime as needed.     No current facility-administered medications on file prior to visit.     Family Hx: The patient's family history includes Breast cancer in her mother; Diabetes in her maternal grandmother; Lung cancer in her father.  ROS:   Please see the history of present illness.    Review of Systems  Constitutional: Negative.   Respiratory: Negative.    Cardiovascular: Negative.   Gastrointestinal: Negative.   Musculoskeletal: Negative.   Neurological: Negative.   Psychiatric/Behavioral: Negative.    All other systems reviewed and are negative.    Labs/Other Tests and Data Reviewed:    Recent Labs: 04/19/2021: BUN 6; Creatinine, Ser 0.86; Hemoglobin 15.1; Platelets 261; Potassium 3.8; Sodium 138   Recent Lipid Panel Lab Results  Component Value Date/Time   CHOL 145 09/11/2018 05:27 AM   TRIG 163 (H) 09/11/2018 05:27 AM   HDL 42 09/11/2018 05:27 AM   CHOLHDL 3.5 09/11/2018 05:27 AM   LDLCALC 70 09/11/2018 05:27 AM    Wt Readings from Last 3 Encounters:  07/14/21 200 lb (90.7 kg)  04/19/21 200 lb (90.7 kg)  03/17/21 200 lb (90.7 kg)     Exam:    Vital Signs: Vital signs may also be detailed in the HPI LMP 03/13/2015 (Approximate) Comment: more than 2 years ago  [facility]  Readings from Last 3 Encounters:  07/14/21 200 lb (90.7 kg)  04/19/21 200 lb (90.7 kg)  03/17/21 200 lb (90.7 kg)   Temp Readings from  Last 3 Encounters:  07/14/21 (!) 97 F (36.1 C) (Temporal)  04/19/21 98.2 F (36.8 C) (Oral)  03/17/21 (!) 96.9 F (36.1 C) (Temporal)   BP Readings from Last 3 Encounters:  07/14/21 (!) 162/72  04/19/21 (!) 159/75  03/17/21 (!) 141/74   Pulse Readings from Last 3 Encounters:  07/14/21 95  04/19/21 (!) 101  03/17/21 91    Vitals: BP: 119/65 in AM, sometimes higher in evening Pulse:80 resp 54  Well nourished, well developed female in no acute distress. Constitutional:  oriented to person, place, and time. No distress.  Head: Normocephalic and atraumatic.  Eyes:  no discharge. No scleral icterus.  Neck: Normal range of motion. Neck supple.  Pulmonary/Chest: No audible wheezing, no distress, appears comfortable Musculoskeletal: Normal range of motion.  no  tenderness or deformity.  Neurological:   Coordination normal. Full exam not performed Skin:  No rash Psychiatric:  normal mood and affect. behavior is normal. Thought content normal.    ASSESSMENT & PLAN:    Chronic diastolic heart failure (HCC) Weight relatively stable Taking Lasix 40 mg daily, extra Lasix after lunch 20-40 for any ankle swelling Appears euvolemic today   Smoker Reports that she stopped smoking October 2017   Palpitations Denies having significant palpitations No further workup at this time On diltiazem   Pulmonary HTN Euvolemic High pressures noted on echo 2016 Improved on echo 2020 On lasix   Shortness of breath  severe COPD, obesity, deconditioning, diastolic CHF/pulmonary hypertension On oxygen around the house stable   COPD (chronic obstructive pulmonary disease) with acute bronchitis (HCC) Currently uses  steroid inhalers, albuterol On oxygen around the house stable   Obstructive sleep apnea Unable to tolerate CPAP, Panic attacks   Morbid obesity ([place]) We have encouraged continued exercise, careful diet management in an effort to lose weight.  Essential HTN: On dilatem  120 daily diltazem 30 as needed  Panic attacks: On cymbalta, lyrica Would like ativan for PRN, should not have any problem with cardiac issues   COVID-19 Education: The signs and symptoms of COVID-19 were discussed with the patient and how to seek care for testing (follow up with PCP or arrange E-visit).  The importance of social distancing was discussed today.  Patient Risk:   After full review of this patients clinical status, I feel that they are at least moderate risk at this time.  Time:   Today, I have spent 25 minutes with the patient with telehealth technology discussing the cardiac and medical problems/diagnoses detailed above     Medication Adjustments/Labs and Tests Ordered: Current medicines are reviewed at length with the patient today.  Concerns regarding medicines are outlined above.   Tests Ordered: No tests ordered   Medication Changes: No changes made   Disposition: Follow-up in 6 months   Signed, Ida Rogue, MD  08/02/2021 4:24 PM    [place] Office [address] [place] [address], [place], [place] [address]

## 2021-08-03 ENCOUNTER — Ambulatory Visit: Payer: Medicaid Other | Admitting: Cardiovascular Disease

## 2021-08-03 DIAGNOSIS — R000 Tachycardia, unspecified: Secondary | ICD-10-CM

## 2021-08-03 DIAGNOSIS — I5032 Chronic diastolic (congestive) heart failure: Secondary | ICD-10-CM

## 2021-08-03 DIAGNOSIS — J209 Acute bronchitis, unspecified: Secondary | ICD-10-CM

## 2021-08-03 DIAGNOSIS — I272 Pulmonary hypertension, unspecified: Secondary | ICD-10-CM

## 2021-08-03 DIAGNOSIS — E119 Type 2 diabetes mellitus without complications: Secondary | ICD-10-CM

## 2021-08-03 DIAGNOSIS — I10 Essential (primary) hypertension: Secondary | ICD-10-CM

## 2021-08-05 ENCOUNTER — Other Ambulatory Visit: Payer: Self-pay | Admitting: Family

## 2021-08-17 ENCOUNTER — Telehealth: Payer: Self-pay | Admitting: Pain Medicine

## 2021-08-17 NOTE — Telephone Encounter (Signed)
PA sent via [place] [employer] with records.  

## 2021-08-17 NOTE — Telephone Encounter (Signed)
Erica [name] at [facility] called stating patient needs PA on medications.

## 2021-09-01 ENCOUNTER — Ambulatory Visit: Payer: Medicaid Other | Admitting: Cardiovascular Disease

## 2021-09-02 ENCOUNTER — Encounter: Payer: Self-pay | Admitting: Cardiovascular Disease

## 2021-09-11 ENCOUNTER — Other Ambulatory Visit: Payer: Self-pay | Admitting: Cardiovascular Disease

## 2021-09-28 ENCOUNTER — Encounter: Payer: Self-pay | Admitting: Cardiovascular Disease

## 2021-09-28 ENCOUNTER — Other Ambulatory Visit: Payer: Self-pay | Admitting: Cardiovascular Disease

## 2021-09-28 ENCOUNTER — Ambulatory Visit: Payer: Medicaid Other | Attending: Pain Medicine | Admitting: Pain Medicine

## 2021-09-28 ENCOUNTER — Telehealth: Payer: Self-pay

## 2021-09-28 ENCOUNTER — Other Ambulatory Visit: Payer: Self-pay

## 2021-09-28 DIAGNOSIS — M7918 Myalgia, other site: Secondary | ICD-10-CM

## 2021-09-28 DIAGNOSIS — G8929 Other chronic pain: Secondary | ICD-10-CM

## 2021-09-28 DIAGNOSIS — M79675 Pain in left toe(s): Secondary | ICD-10-CM

## 2021-09-28 DIAGNOSIS — M25552 Pain in left hip: Secondary | ICD-10-CM

## 2021-09-28 DIAGNOSIS — M79641 Pain in right hand: Secondary | ICD-10-CM

## 2021-09-28 DIAGNOSIS — M25561 Pain in right knee: Secondary | ICD-10-CM | POA: Diagnosis not present

## 2021-09-28 DIAGNOSIS — Z79891 Long term (current) use of opiate analgesic: Secondary | ICD-10-CM

## 2021-09-28 DIAGNOSIS — Z79899 Other long term (current) drug therapy: Secondary | ICD-10-CM

## 2021-09-28 DIAGNOSIS — M549 Dorsalgia, unspecified: Secondary | ICD-10-CM

## 2021-09-28 DIAGNOSIS — G894 Chronic pain syndrome: Secondary | ICD-10-CM | POA: Diagnosis not present

## 2021-09-28 DIAGNOSIS — M5137 Other intervertebral disc degeneration, lumbosacral region: Secondary | ICD-10-CM

## 2021-09-28 DIAGNOSIS — M51379 Other intervertebral disc degeneration, lumbosacral region without mention of lumbar back pain or lower extremity pain: Secondary | ICD-10-CM

## 2021-09-28 DIAGNOSIS — M542 Cervicalgia: Secondary | ICD-10-CM

## 2021-09-28 DIAGNOSIS — M25551 Pain in right hip: Secondary | ICD-10-CM | POA: Diagnosis not present

## 2021-09-28 DIAGNOSIS — M5030 Other cervical disc degeneration, unspecified cervical region: Secondary | ICD-10-CM

## 2021-09-28 DIAGNOSIS — M545 Low back pain, unspecified: Secondary | ICD-10-CM | POA: Diagnosis not present

## 2021-09-28 DIAGNOSIS — M79642 Pain in left hand: Secondary | ICD-10-CM

## 2021-09-28 MED ORDER — MORPHINE SULFATE ER 15 MG PO TBCR: 15.0000 mg | EXTENDED_RELEASE_TABLET | Freq: Two times a day (BID) | ORAL | 0 refills | Status: DC

## 2021-09-28 NOTE — Telephone Encounter (Signed)
Contacted patient several times and mailed a letter last mont. Will resend letter and close this encounter.  ?

## 2021-09-28 NOTE — Telephone Encounter (Signed)
?*  STAT* If patient is at the pharmacy, call can be transferred to refill team. ? ? ?1. Which medications need to be refilled? (please list name of each medication and dose if known)  ? ?Diltiazem 120 mg po q d  ?Diltiazem 30 mg po q d  ?Spironolactone 25 mg po q d  ? ?2. Which pharmacy/location (including street and city if local pharmacy) is medication to be sent to? [facility] [address] [place] [address]  ? ?3. Do they need a 30 day or 90 day supply? 90 or enough to get to April appt  ? ?Patient does not need furosemide at this time - pcp fills this per patient  ? ?

## 2021-09-28 NOTE — Telephone Encounter (Signed)
Call attempted to discuss with patient-unable to speak with patient or leave message on voice mailbox. ?
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
## 2021-09-28 NOTE — Telephone Encounter (Signed)
Spoke with patient and advised her to get refills through PCP until she can be seen in our office due to the length of time that has passed since she has been seen here. Patient expressed gratitude for the call. ?

## 2021-09-28 NOTE — Telephone Encounter (Signed)
Patient needs an appt.

## 2021-09-28 NOTE — Telephone Encounter (Signed)
Patient requesting refills of diltiazem 30mg , diltiazem 120mg , and spironolactone to be sent to ?Scheduled appt to see Dr. on 10/27/2021. ?Last seen 04/25/20 with advised F/U in 2 weeks. Patient has no showed and canceled several appointments since then. Not sure who has been refilling these medications. Please advise if OK to refill until upcoming appointment. Thank you! ?

## 2021-09-29 ENCOUNTER — Other Ambulatory Visit: Payer: Self-pay | Admitting: Cardiovascular Disease

## 2021-09-29 MED ORDER — FUROSEMIDE 40 MG PO TABS: ORAL_TABLET | ORAL | 0 refills | Status: DC

## 2021-09-29 NOTE — Addendum Note (Signed)
Addended by: Margrett Rud on: 09/29/2021 11:31 AM ? ? Modules accepted: Orders ? ?

## 2021-10-07 ENCOUNTER — Encounter: Payer: Medicaid Other | Admitting: Pain Medicine

## 2021-10-26 ENCOUNTER — Encounter: Payer: Self-pay | Admitting: Pain Medicine

## 2021-10-26 ENCOUNTER — Ambulatory Visit: Payer: Medicaid Other | Attending: Pain Medicine | Admitting: Pain Medicine

## 2021-10-26 VITALS — BP 130/109 | HR 104 | Temp 97.0°F | Ht 68.0 in | Wt 200.0 lb

## 2021-10-26 DIAGNOSIS — M25551 Pain in right hip: Secondary | ICD-10-CM | POA: Diagnosis present

## 2021-10-26 DIAGNOSIS — M549 Dorsalgia, unspecified: Secondary | ICD-10-CM | POA: Diagnosis present

## 2021-10-26 DIAGNOSIS — Z79899 Other long term (current) drug therapy: Secondary | ICD-10-CM | POA: Diagnosis present

## 2021-10-26 DIAGNOSIS — G8929 Other chronic pain: Secondary | ICD-10-CM

## 2021-10-26 DIAGNOSIS — M79641 Pain in right hand: Secondary | ICD-10-CM | POA: Insufficient documentation

## 2021-10-26 DIAGNOSIS — M79675 Pain in left toe(s): Secondary | ICD-10-CM | POA: Insufficient documentation

## 2021-10-26 DIAGNOSIS — M542 Cervicalgia: Secondary | ICD-10-CM | POA: Diagnosis present

## 2021-10-26 DIAGNOSIS — M25561 Pain in right knee: Secondary | ICD-10-CM | POA: Insufficient documentation

## 2021-10-26 DIAGNOSIS — M533 Sacrococcygeal disorders, not elsewhere classified: Secondary | ICD-10-CM | POA: Insufficient documentation

## 2021-10-26 DIAGNOSIS — M25562 Pain in left knee: Secondary | ICD-10-CM | POA: Insufficient documentation

## 2021-10-26 DIAGNOSIS — Z79891 Long term (current) use of opiate analgesic: Secondary | ICD-10-CM | POA: Diagnosis present

## 2021-10-26 DIAGNOSIS — G894 Chronic pain syndrome: Secondary | ICD-10-CM

## 2021-10-26 DIAGNOSIS — M5137 Other intervertebral disc degeneration, lumbosacral region: Secondary | ICD-10-CM | POA: Diagnosis present

## 2021-10-26 DIAGNOSIS — M25552 Pain in left hip: Secondary | ICD-10-CM | POA: Insufficient documentation

## 2021-10-26 DIAGNOSIS — M5030 Other cervical disc degeneration, unspecified cervical region: Secondary | ICD-10-CM

## 2021-10-26 DIAGNOSIS — M545 Low back pain, unspecified: Secondary | ICD-10-CM | POA: Diagnosis present

## 2021-10-26 DIAGNOSIS — M79674 Pain in right toe(s): Secondary | ICD-10-CM | POA: Diagnosis present

## 2021-10-26 DIAGNOSIS — M7918 Myalgia, other site: Secondary | ICD-10-CM | POA: Diagnosis present

## 2021-10-26 DIAGNOSIS — M79642 Pain in left hand: Secondary | ICD-10-CM

## 2021-10-26 DIAGNOSIS — M51379 Other intervertebral disc degeneration, lumbosacral region without mention of lumbar back pain or lower extremity pain: Secondary | ICD-10-CM

## 2021-10-26 MED ORDER — MORPHINE SULFATE ER 15 MG PO TBCR: 15.0000 mg | EXTENDED_RELEASE_TABLET | Freq: Two times a day (BID) | ORAL | 0 refills | Status: DC

## 2021-10-26 NOTE — Progress Notes (Signed)
PROVIDER NOTE: Information contained herein reflects review and annotations entered in association with encounter. Interpretation of such information and data should be left to medically-trained personnel. Information provided to patient can be located elsewhere in the medical record under "Patient Instructions". Document created using STT-dictation technology, any transcriptional errors that may result from process are unintentional.  ?  ?Patient: Erica [name]  Service Category: E/M  Provider: Gaspar Cola, MD  ?DOB: 1965/09/30  DOS: 10/26/2021  Specialty: Interventional Pain Management  ?MRN: [id number]  Setting: Ambulatory outpatient  PCP: Denton Lank, MD  ?Type: Established Patient    Referring Provider: Denton Lank, MD  ?Location: Office  Delivery: Face-to-face    ? ?HPI  ?Erica [name], a 56 y.o. year old female, is here today because of her Chronic pain syndrome [G89.4]. Erica [name] primary complain today is Back Pain (lower) ?Last encounter: My last encounter with her was on 09/28/2021. ?Pertinent problems: Erica [name] has Chronic low back pain (1ry area of Pain) (Bilateral) (L>R) w/o sciatica; Chronic knee pain (3ry area of Pain) (Bilateral) (L>R); Chronic neck pain (Bilateral) (R>L); Chronic upper back pain (midline); Chronic foot pain (bottom of feet) (Bilateral) (R>L); Peripheral neuropathy, idiopathic (upper and lower extremity); Chronic sacroiliac joint pain (Bilateral) (R>L); Lumbar facet syndrome (Bilateral) (R>L); Chronic pain syndrome; Neurogenic pain; Lumbar spondylosis; Osteoarthritis of knee (Bilateral) (L>R); Intermittent left thoracic Muscle cramps; Spasm of thoracolumbar muscle (Left); Post-traumatic osteoarthritis of knee (Left); Hemarthrosis, left knee; 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); Chronic hip pain (2ry area of Pain) (Bilateral) (L>R); Spondylosis without myelopathy or radiculopathy, lumbosacral region; Arthropathy of left hip; Rib pain on right side; Primary  localized osteoarthrosis, pelvic region and thigh; DDD (degenerative disc disease), lumbosacral; Osteoarthritis of facet joint of lumbar spine; Chronic musculoskeletal pain; Chronic low back pain (Bilateral) w/ sciatica (Bilateral); Tricompartmental disease of knee; Cervicalgia; Numbness and tingling of upper extremity (Left); DDD (degenerative disc disease), cervical; Cervical radiculopathy (sensory) (Left); Neuropathy; Other specified dorsopathies, site unspecified; Anterolisthesis of cervical spine (C4/C5) (3 mm); Retrolisthesis of cervical spine (C5/C6) (2 mm); Primary osteoarthritis of left knee; and Chronic hand pain (Bilateral) on their pertinent problem list. ?Pain Assessment: Severity of Chronic pain is reported as a 6 /10. Location: Back Right, Left/ . Onset: More than a month ago. Quality: Constant, Aching, Sharp, Nagging. Timing: Constant. Modifying factor(s): meds, laying down. ?Vitals:  height is 5\' 8"  (1.727 m) and weight is 200 lb (90.7 kg). Her temperature is 97 ?F (36.1 ?C) (abnormal). Her blood pressure is 130/109 (abnormal) and her pulse is 104 (abnormal). Her oxygen saturation is 97%.  ? ?Reason for encounter: medication management. The patient indicates doing well with the current medication regimen. No adverse reactions or side effects reported to the medications.  ? ?UDS ordered today.  ? ?RTCB: 01/26/2022 ?Nonopioids transferred 06/23/2020: Zanaflex, Lyrica, and vitamin D3 ? ?Pharmacotherapy Assessment  ?Analgesic: Morphine ER (MS Contin) 15 mg, 1 tab p.o. every 12 hours (30 mg/day of morphine) ?MME/day: 30 mg/day.  ? ?Monitoring: ?[place] PMP: PDMP reviewed during this encounter.       ?Pharmacotherapy: No side-effects or adverse reactions reported. ?Compliance: No problems identified. ?Effectiveness: Clinically acceptable. ? ?Chauncey Fischer, RN  10/26/2021  9:03 AM  Sign when Signing Visit ?Nursing Pain Medication Assessment:  ?Safety precautions to be maintained throughout the outpatient stay  will include: orient to surroundings, keep bed in low position, maintain call bell within reach at all times, provide assistance with transfer out of bed and ambulation.  ?Medication  Inspection Compliance: Pill count conducted under aseptic conditions, in front of the patient. Neither the pills nor the bottle was removed from the patient's sight at any time. Once count was completed pills were immediately returned to the patient in their original bottle. ? ?Medication: Morphine ER (MSContin) ?Pill/Patch Count:  10 of 30 pills remain ?Pill/Patch Appearance: Markings consistent with prescribed medication ?Bottle Appearance: Standard pharmacy container. Clearly labeled. ?Filled Date: 3 / 64 / 2023 ?Last Medication intake:  YesterdaySafety precautions to be maintained throughout the outpatient stay will include: orient to surroundings, keep bed in low position, maintain call bell within reach at all times, provide assistance with transfer out of bed and ambulation.  ?   UDS:  ?Summary  ?Date Value Ref Range Status  ?03/17/2021 Note  Final  ?  Comment:  ?  ==================================================================== ?ToxASSURE Select 13 (MW) ?==================================================================== ?Test                             Result       Flag       Units ? ?Drug Present and Declared for Prescription Verification ?  7-aminoclonazepam              118          EXPECTED   ng/mg creat ?   7-aminoclonazepam is an expected metabolite of clonazepam. Source of ?   clonazepam is a scheduled prescription medication. ? ?Drug Absent but Declared for Prescription Verification ?  Morphine                       Not Detected UNEXPECTED ng/mg creat ?==================================================================== ?Test                      Result    Flag   Units      Ref Range ?  Creatinine              102              mg/dL       >=20 ?==================================================================== ?Declared Medications: ? The flagging and interpretation on this report are based on the ? following declared medications.  Unexpected results may arise from ? inaccuracies in the declared medications. ? ? **Note: The testing scope of this panel includes these medications: ? ? Clonazepam (Klonopin) ? Morphine (MS Contin) ? ? **Note: The testing scope of this panel does not include the ? following reported medications: ? ? Albuterol (Ventolin HFA) ? Aspirin ? Atorvastatin (Lipitor) ? Buspirone (Buspar) ? Diltiazem (Cardizem) ? Duloxetine (Cymbalta) ? Fluticasone (Advair) ? Furosemide (Lasix) ? Helium ? Magnesium ? Metformin (Glucophage) ? Oxygen ? Pantoprazole (Protonix) ? Potassium (Klor-Con) ? Pregabalin (Lyrica) ? Salmeterol (Advair) ? Spironolactone (Aldactone) ? Sucralfate (Carafate) ? Tiotropium (Spiriva) ? Tizanidine (Zanaflex) ? Vitamin D3 ?==================================================================== ?For clinical consultation, please call [phone]. ?==================================================================== ?  ?  ? ?ROS  ?Constitutional: Denies any fever or chills ?Gastrointestinal: No reported hemesis, hematochezia, vomiting, or acute GI distress ?Musculoskeletal: Denies any acute onset joint swelling, redness, loss of ROM, or weakness ?Neurological: No reported episodes of acute onset apraxia, aphasia, dysarthria, agnosia, amnesia, paralysis, loss of coordination, or loss of consciousness ? ?Medication Review  ?Accu-Chek Aviva Plus, DULoxetine, Fluticasone-Salmeterol, Magnesium, Oxygen-Helium, Vitamin D3, albuterol, aspirin, busPIRone, clonazePAM, diltiazem, empagliflozin, fluconazole, furosemide, metFORMIN, morphine, pantoprazole, potassium chloride SA, pregabalin, rosuvastatin, spironolactone, sucralfate, tiZANidine, tiotropium, and zolpidem ? ?History Review  ?Allergy:  Erica [name] is allergic to tramadol  and penicillins. ?Drug: Erica [name]  reports no history of drug use. ?Alcohol:  reports current alcohol use. ?Tobacco:  reports that she has been smoking cigarettes. She has a 36.00 pack-year smoking history. She has never used smokeless tobacco. ?Social: Erica [name]  reports that she has

## 2021-10-26 NOTE — Progress Notes (Signed)
Nursing Pain Medication Assessment:  ?Safety precautions to be maintained throughout the outpatient stay will include: orient to surroundings, keep bed in low position, maintain call bell within reach at all times, provide assistance with transfer out of bed and ambulation.  ?Medication Inspection Compliance: Pill count conducted under aseptic conditions, in front of the patient. Neither the pills nor the bottle was removed from the patient's sight at any time. Once count was completed pills were immediately returned to the patient in their original bottle. ? ?Medication: Morphine ER (MSContin) ?Pill/Patch Count:  10 of 30 pills remain ?Pill/Patch Appearance: Markings consistent with prescribed medication ?Bottle Appearance: Standard pharmacy container. Clearly labeled. ?Filled Date: 3 / 94 / 2023 ?Last Medication intake:  YesterdaySafety precautions to be maintained throughout the outpatient stay will include: orient to surroundings, keep bed in low position, maintain call bell within reach at all times, provide assistance with transfer out of bed and ambulation.  ?

## 2021-10-26 NOTE — Progress Notes (Signed)
?  ? ? ?Date:  10/26/2021  ? ?ID:  Erica [name], DOB [birth year], MRN [id number] ? ?Patient Location:  ?[address] ?[place] [address]  ? ?Provider location:   ?[place], [employer] office ? ?PCP:  Denton Lank, MD  ?Cardiologist:  [facility] ? ?Chief Complaint  ?Patient presents with  ? 12 month follow up   ?  "Doing well." Medications reviewed by the patient verbally.   ? ? ?History of Present Illness:   ? ?Erica [name] is a 56 y.o. female with  past medical history of ?smoking, COPD, on inhalers, quit 04/2016 ?medication noncompliance secondary to financial and insurance issues,  ?chronic thrush,  ?chronic back pain  ? hospital  November 2016 with shortness of breath, pneumonia,  ?echocardiogram showing normal LV function, EF 60%,  elevated right heart pressures,   ?sodium around 112, abnormal troponin, long hospital course ?CT 06/2015, No significant CAD, PAD, minimal in aortic arch ?She presents today for follow-up of her chronic diastolic CHF ? ?Seen by myself in clinic June 2019 ?Seen by one of our providers October 2021 ? ?Still smoking, trying to quit ?On oxygen at home.  Did not bring oxygen today, saturations 97% at rest ? ?Remains on diltiazem extended release daily for blood pressure, tachycardia ?Takes extra diltiazem 30 mg pill as needed for palpitation or high pressure ? ?Reports that she has individuals living in her house to having issues with drugs, she is trying to help ? ?EKG personally reviewed by myself on todays visit ?Normal sinus rhythm rate 86 bpm significant ST-T wave changes ? ?Past medical history reviewed ?hospital February 2020 ?Alcoholic ketoacidosis ?Acute alcoholic gastritis  ?Had chest pain ?Acute hypokalemia, potassium 2.2, up to 3.8 ?Phos 1.1, mag 1.3 ?Atypical chest pain: ?Her EKG is normal and echocardiogram was unremarkable. ? ?Other past medical history reviewed ?weight gain, worsening shortness of breath ?Seen in the ER 06/14/16 with shortness  of breath symptoms, given IV Lasix ?  ?Previously had several Prednisone tapers, most recently in jan 2018,  ?OSA, does not use CPAP. Had problems with anxiety ?  ?Continues to take Advair, spiriva and proair ?Problems with thrush, takes Diflucan 150 mg ?1 as needed  ?  ?CT scan chest  Oct 2017 ?  ?Previous CT 06/2015 ?No significant CAD, PAD, minimal in aortic arch ? ?Echo 09/11/2018 ? 1. The left ventricle has normal systolic function, with an ejection fraction of 55-60%. The cavity size was normal. There is mild concentric left ventricular hypertrophy. Left ventricular diastolic Doppler parameters are consistent with impaired  ?relaxation. ? 2. The right ventricle has normal systolic function. The cavity was normal. There is no increase in right ventricular wall thickness. ? 3. The mitral valve is normal in structure. ? 4. The tricuspid valve is normal in structure. ? 5. The aortic valve is tricuspid. ? 6. The pulmonic valve was normal in structure. ? ? ?Past Medical History:  ?Diagnosis Date  ? (HFpEF) heart failure with preserved ejection fraction ([place])   ? a. 05/2015 Echo: EF 60-65%, no rwma, PASP 14mHg.  ? Acute pancreatitis 08/13/2018  ? Arthritis   ? Asthma   ? Bell's palsy   ? Bronchitis   ? CHF (congestive heart failure) ([place]   ? COPD (chronic obstructive pulmonary disease) ([place]   ? Diabetes mellitus without complication ([place]   ? type II 03/2017  ? Gastric ulcer   ? Hyperlipidemia   ? Hypertension   ? OSA (obstructive sleep apnea)   ?  a. did not tolerate CPAP.  ? Pancreatitis   ? 07/2018  ? Shoulder injury   ? 6/19  ? ?Past Surgical History:  ?Procedure Laterality Date  ? FLEXIBLE BRONCHOSCOPY N/A 06/20/2015  ? Procedure: FLEXIBLE BRONCHOSCOPY;  Surgeon: Laverle Hobby, MD;  Location: [facility] ORS;  Service: Pulmonary;  Laterality: N/A;  ? KNEE SURGERY Left   ? 8 knee surgeries  ?  ? ?No outpatient medications have been marked as taking for the 10/27/21 encounter (Appointment) with Minna Merritts, MD.   ?  ? ?Allergies:   Tramadol and Penicillins  ? ?Social History  ? ?Tobacco Use  ? Smoking status: Every Day  ?  Packs/day: 1.00  ?  Years: 36.00  ?  Pack years: 36.00  ?  Types: Cigarettes  ?  Last attempt to quit: 06/13/2015  ?  Years since quitting: 6.3  ? Smokeless tobacco: Never  ?Vaping Use  ? Vaping Use: Never used  ?Substance Use Topics  ? Alcohol use: Yes  ?  Comment: occasionally  ? Drug use: No  ?  ? ?Current Outpatient Medications on File Prior to Visit  ?Medication Sig Dispense Refill  ? albuterol (VENTOLIN HFA) 108 (90 Base) MCG/ACT inhaler Inhale 2 puffs into the lungs every 6 (six) hours as needed for wheezing or shortness of breath.     ? aspirin EC 81 MG EC tablet Take 1 tablet (81 mg total) by mouth daily. 30 tablet 0  ? Blood Glucose Monitoring Suppl (ACCU-CHEK AVIVA PLUS) w/Device KIT See admin instructions.  0  ? busPIRone (BUSPAR) 10 MG tablet Take 20 mg by mouth 2 (two) times daily.    ? Cholecalciferol (VITAMIN D3) 50 MCG (2000 UT) capsule Take 1 capsule (2,000 Units total) by mouth daily. Take 1 capsule (2,000 Units total) by mouth daily. 30 capsule 2  ? clonazePAM (KLONOPIN) 0.5 MG tablet Take 0.5 mg by mouth at bedtime as needed.    ? diltiazem (CARDIZEM CD) 120 MG 24 hr capsule TAKE 1 CAPSULE BY MOUTH EVERY DAY 30 capsule 0  ? diltiazem (CARDIZEM) 30 MG tablet TAKE 1 TABLET(30 MG) BY MOUTH THREE TIMES DAILY AS NEEDED 90 tablet 0  ? DULoxetine (CYMBALTA) 60 MG capsule Take 60 mg by mouth 2 (two) times daily.     ? fluconazole (DIFLUCAN) 150 MG tablet Take 150 mg by mouth once.    ? Fluticasone-Salmeterol (ADVAIR) 250-50 MCG/DOSE AEPB Inhale 1 puff into the lungs 2 (two) times daily.    ? furosemide (LASIX) 40 MG tablet TAKE 1 TABLET BY MOUTH EVERY DAY. MAY TAKE AN ADDITIONAL TABLET IN THE AM FOR WEIGHT GAIN AS NEEDED 30 tablet 0  ? JARDIANCE 25 MG TABS tablet Take 25 mg by mouth daily.    ? Magnesium 500 MG CAPS once daily.    ? metFORMIN (GLUCOPHAGE) 500 MG tablet Take 1 tablet (500 mg  total) by mouth 2 (two) times daily with a meal. (Patient taking differently: Take 1,000 mg by mouth 2 (two) times daily with a meal.) 30 tablet 0  ? [START ON 10/28/2021] morphine (MS CONTIN) 15 MG 12 hr tablet Take 1 tablet (15 mg total) by mouth every 12 (twelve) hours. Must last 30 days. Do not break tablet 60 tablet 0  ? [START ON 11/27/2021] morphine (MS CONTIN) 15 MG 12 hr tablet Take 1 tablet (15 mg total) by mouth every 12 (twelve) hours. Must last 30 days. Do not break tablet 60 tablet 0  ? [START ON 12/27/2021]  morphine (MS CONTIN) 15 MG 12 hr tablet Take 1 tablet (15 mg total) by mouth every 12 (twelve) hours. Must last 30 days. Do not break tablet 60 tablet 0  ? OXYGEN Inhale 2 L into the lungs at bedtime.     ? pantoprazole (PROTONIX) 40 MG tablet Take 1 tablet (40 mg total) by mouth daily. 30 tablet 0  ? potassium chloride SA (KLOR-CON) 20 MEQ tablet Take 20 mEq by mouth 2 (two) times daily.    ? pregabalin (LYRICA) 150 MG capsule Take 1 capsule (150 mg total) by mouth 3 (three) times daily. 90 capsule 2  ? rosuvastatin (CRESTOR) 20 MG tablet Take 20 mg by mouth daily.    ? spironolactone (ALDACTONE) 25 MG tablet Take 25 mg by mouth every morning.    ? sucralfate (CARAFATE) 1 G tablet Take 1 tablet (1 g total) by mouth 4 (four) times daily -  with meals and at bedtime. (Patient taking differently: Take 1 g by mouth 2 (two) times daily.) 120 tablet 0  ? tiotropium (SPIRIVA) 18 MCG inhalation capsule Place 18 mcg into inhaler and inhale daily.    ? tiZANidine (ZANAFLEX) 4 MG tablet Take 4 mg by mouth 3 (three) times daily as needed.    ? zolpidem (AMBIEN) 5 MG tablet Take 5 mg by mouth at bedtime as needed.    ? ?No current facility-administered medications on file prior to visit.  ?  ? ?Family Hx: ?The patient's family history includes Breast cancer in her mother; Diabetes in her maternal grandmother; Lung cancer in her father. ? ?ROS:   ?Please see the history of present illness.    ?Review of Systems   ?Constitutional: Negative.   ?Respiratory: Negative.    ?Cardiovascular: Negative.   ?Gastrointestinal: Negative.   ?Musculoskeletal: Negative.   ?Neurological: Negative.   ?Psychiatric/Behavioral: Neg

## 2021-10-27 ENCOUNTER — Ambulatory Visit: Payer: Medicaid Other | Admitting: Cardiovascular Disease

## 2021-10-27 ENCOUNTER — Encounter: Payer: Self-pay | Admitting: Cardiovascular Disease

## 2021-10-27 VITALS — BP 120/64 | HR 86 | Ht 68.0 in | Wt 201.5 lb

## 2021-10-27 DIAGNOSIS — J440 Chronic obstructive pulmonary disease with acute lower respiratory infection: Secondary | ICD-10-CM

## 2021-10-27 DIAGNOSIS — J209 Acute bronchitis, unspecified: Secondary | ICD-10-CM

## 2021-10-27 DIAGNOSIS — I272 Pulmonary hypertension, unspecified: Secondary | ICD-10-CM | POA: Diagnosis not present

## 2021-10-27 DIAGNOSIS — I5032 Chronic diastolic (congestive) heart failure: Secondary | ICD-10-CM

## 2021-10-27 DIAGNOSIS — R000 Tachycardia, unspecified: Secondary | ICD-10-CM | POA: Diagnosis not present

## 2021-10-27 DIAGNOSIS — E119 Type 2 diabetes mellitus without complications: Secondary | ICD-10-CM | POA: Diagnosis not present

## 2021-10-27 DIAGNOSIS — R7989 Other specified abnormal findings of blood chemistry: Secondary | ICD-10-CM

## 2021-10-27 DIAGNOSIS — I10 Essential (primary) hypertension: Secondary | ICD-10-CM

## 2021-10-27 MED ORDER — VARENICLINE TARTRATE 1 MG PO TABS: 1.0000 mg | ORAL_TABLET | Freq: Two times a day (BID) | ORAL | 3 refills | Status: DC

## 2021-10-27 MED ORDER — DILTIAZEM HCL ER COATED BEADS 120 MG PO CP24
ORAL_CAPSULE | ORAL | 3 refills | Status: DC
Start: 2021-10-27 — End: 2023-01-05

## 2021-10-27 MED ORDER — DILTIAZEM HCL 30 MG PO TABS: 30.0000 mg | ORAL_TABLET | Freq: Three times a day (TID) | ORAL | 0 refills | Status: DC | PRN

## 2021-10-27 MED ORDER — FUROSEMIDE 40 MG PO TABS: ORAL_TABLET | ORAL | 3 refills | Status: DC

## 2021-10-27 MED ORDER — SPIRONOLACTONE 25 MG PO TABS: 25.0000 mg | ORAL_TABLET | Freq: Every morning | ORAL | 3 refills | Status: DC

## 2021-10-27 NOTE — Patient Instructions (Addendum)
Medication Instructions:  ?Chantix 1 mg twice a day for smoking cessation ? ?If you need a refill on your cardiac medications before your next appointment, please call your pharmacy.  ? ?Lab work: ?No new labs needed ? ?Testing/Procedures: ?No new testing needed ? ?Follow-Up: ?At [facility], you and your health needs are our priority.  As part of our continuing mission to provide you with exceptional heart care, we have created designated Provider Care Teams.  These Care Teams include your primary Cardiologist (physician) and Advanced Practice Providers (APPs -  Physician Assistants and Nurse Practitioners) who all work together to provide you with the care you need, when you need it. ? ?You will need a follow up appointment in 12 months ? ?Providers on your designated Care Team:   ?Nicolasa Ducking, NP ?Eula Listen, PA-C ?Cadence Fransico Michael, PA-C ? ?COVID-19 Vaccine Information can be found at: [email or web address][employer] For questions related to vaccine distribution or appointments, please email [email or web address] or call [phone].  ? ?

## 2021-10-29 ENCOUNTER — Telehealth: Payer: Self-pay | Admitting: Pain Medicine

## 2021-10-29 ENCOUNTER — Telehealth: Payer: Self-pay | Admitting: Cardiovascular Disease

## 2021-10-29 MED ORDER — DILTIAZEM HCL 30 MG PO TABS: 30.0000 mg | ORAL_TABLET | Freq: Three times a day (TID) | ORAL | 3 refills | Status: DC | PRN

## 2021-10-29 NOTE — Telephone Encounter (Signed)
?*  STAT* If patient is at the pharmacy, call can be transferred to refill team. ? ? ?1. Which medications need to be refilled? (please list name of each medication and dose if known)  ? diltiazem (CARDIZEM) 30 MG tablet  ? ? ?2. Which pharmacy/location (including street and city if local pharmacy) is medication to be sent to?  ?[place], [facility] ?3. Do they need a 30 day or 90 day supply? 30 day ? ?

## 2021-10-29 NOTE — Telephone Encounter (Signed)
Pharmacy needs verification on scripts sent to fill. Please call  ?

## 2021-10-29 NOTE — Telephone Encounter (Signed)
Done.   JCS ?

## 2021-10-29 NOTE — Telephone Encounter (Signed)
Requested Prescriptions  ? ?Signed Prescriptions Disp Refills  ? diltiazem (CARDIZEM) 30 MG tablet 30 tablet 3  ?  Sig: Take 1 tablet (30 mg total) by mouth 3 (three) times daily as needed.  ?  Authorizing Provider: Antonieta Iba  ?  Ordering User: Iverson Alamin C  ? ? ?

## 2021-10-30 LAB — TOXASSURE SELECT 13 (MW), URINE

## 2021-11-30 ENCOUNTER — Telehealth: Payer: Self-pay | Admitting: Pain Medicine

## 2021-11-30 NOTE — Telephone Encounter (Signed)
Pt stated that she needs her morphine patches. Please call Pt and Pharmacy with an update. ?

## 2021-12-01 NOTE — Telephone Encounter (Signed)
Patient called asking if the PA has been sent in for her medications? Would like a call back please ?
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
## 2022-01-18 ENCOUNTER — Ambulatory Visit (HOSPITAL_BASED_OUTPATIENT_CLINIC_OR_DEPARTMENT_OTHER): Payer: Medicaid Other | Admitting: Pain Medicine

## 2022-01-18 ENCOUNTER — Ambulatory Visit
Admission: RE | Admit: 2022-01-18 | Discharge: 2022-01-18 | Disposition: A | Discharge status: Home / Self Care | Payer: Medicaid Other | Source: Ambulatory Visit | Attending: Pain Medicine | Admitting: Pain Medicine

## 2022-01-18 ENCOUNTER — Encounter: Payer: Self-pay | Admitting: Pain Medicine

## 2022-01-18 ENCOUNTER — Ambulatory Visit
Admission: RE | Admit: 2022-01-18 | Discharge: 2022-01-18 | Disposition: A | Discharge status: Home / Self Care | Payer: Medicaid Other | Attending: Pain Medicine | Admitting: Pain Medicine

## 2022-01-18 VITALS — BP 111/74 | HR 88 | Temp 97.6°F | Resp 16 | Ht 68.0 in | Wt 196.0 lb

## 2022-01-18 DIAGNOSIS — M25552 Pain in left hip: Secondary | ICD-10-CM

## 2022-01-18 DIAGNOSIS — I5032 Chronic diastolic (congestive) heart failure: Secondary | ICD-10-CM | POA: Insufficient documentation

## 2022-01-18 DIAGNOSIS — M25521 Pain in right elbow: Secondary | ICD-10-CM | POA: Insufficient documentation

## 2022-01-18 DIAGNOSIS — M51379 Other intervertebral disc degeneration, lumbosacral region without mention of lumbar back pain or lower extremity pain: Secondary | ICD-10-CM

## 2022-01-18 DIAGNOSIS — M7918 Myalgia, other site: Secondary | ICD-10-CM

## 2022-01-18 DIAGNOSIS — M533 Sacrococcygeal disorders, not elsewhere classified: Secondary | ICD-10-CM | POA: Insufficient documentation

## 2022-01-18 DIAGNOSIS — M25551 Pain in right hip: Secondary | ICD-10-CM

## 2022-01-18 DIAGNOSIS — Z79899 Other long term (current) drug therapy: Secondary | ICD-10-CM

## 2022-01-18 DIAGNOSIS — M25561 Pain in right knee: Secondary | ICD-10-CM

## 2022-01-18 DIAGNOSIS — M5030 Other cervical disc degeneration, unspecified cervical region: Secondary | ICD-10-CM

## 2022-01-18 DIAGNOSIS — M25511 Pain in right shoulder: Secondary | ICD-10-CM | POA: Insufficient documentation

## 2022-01-18 DIAGNOSIS — M25562 Pain in left knee: Secondary | ICD-10-CM

## 2022-01-18 DIAGNOSIS — M79674 Pain in right toe(s): Secondary | ICD-10-CM | POA: Insufficient documentation

## 2022-01-18 DIAGNOSIS — M47817 Spondylosis without myelopathy or radiculopathy, lumbosacral region: Secondary | ICD-10-CM | POA: Insufficient documentation

## 2022-01-18 DIAGNOSIS — G8929 Other chronic pain: Secondary | ICD-10-CM | POA: Insufficient documentation

## 2022-01-18 DIAGNOSIS — M47816 Spondylosis without myelopathy or radiculopathy, lumbar region: Secondary | ICD-10-CM | POA: Insufficient documentation

## 2022-01-18 DIAGNOSIS — M545 Low back pain, unspecified: Secondary | ICD-10-CM

## 2022-01-18 DIAGNOSIS — W19XXXA Unspecified fall, initial encounter: Secondary | ICD-10-CM | POA: Insufficient documentation

## 2022-01-18 DIAGNOSIS — M79642 Pain in left hand: Secondary | ICD-10-CM

## 2022-01-18 DIAGNOSIS — E1142 Type 2 diabetes mellitus with diabetic polyneuropathy: Secondary | ICD-10-CM | POA: Insufficient documentation

## 2022-01-18 DIAGNOSIS — M4312 Spondylolisthesis, cervical region: Secondary | ICD-10-CM | POA: Insufficient documentation

## 2022-01-18 DIAGNOSIS — M79675 Pain in left toe(s): Secondary | ICD-10-CM

## 2022-01-18 DIAGNOSIS — M549 Dorsalgia, unspecified: Secondary | ICD-10-CM

## 2022-01-18 DIAGNOSIS — M542 Cervicalgia: Secondary | ICD-10-CM

## 2022-01-18 DIAGNOSIS — Z79891 Long term (current) use of opiate analgesic: Secondary | ICD-10-CM | POA: Insufficient documentation

## 2022-01-18 DIAGNOSIS — M5441 Lumbago with sciatica, right side: Secondary | ICD-10-CM | POA: Insufficient documentation

## 2022-01-18 DIAGNOSIS — M1732 Unilateral post-traumatic osteoarthritis, left knee: Secondary | ICD-10-CM | POA: Insufficient documentation

## 2022-01-18 DIAGNOSIS — M79641 Pain in right hand: Secondary | ICD-10-CM | POA: Insufficient documentation

## 2022-01-18 DIAGNOSIS — M5137 Other intervertebral disc degeneration, lumbosacral region: Secondary | ICD-10-CM

## 2022-01-18 DIAGNOSIS — M160 Bilateral primary osteoarthritis of hip: Secondary | ICD-10-CM | POA: Insufficient documentation

## 2022-01-18 DIAGNOSIS — G894 Chronic pain syndrome: Secondary | ICD-10-CM

## 2022-01-18 DIAGNOSIS — X58XXXA Exposure to other specified factors, initial encounter: Secondary | ICD-10-CM | POA: Insufficient documentation

## 2022-01-18 DIAGNOSIS — M5442 Lumbago with sciatica, left side: Secondary | ICD-10-CM | POA: Insufficient documentation

## 2022-01-18 MED ORDER — MORPHINE SULFATE ER 15 MG PO TBCR: 15.0000 mg | EXTENDED_RELEASE_TABLET | Freq: Two times a day (BID) | ORAL | 0 refills | Status: DC

## 2022-01-18 NOTE — Patient Instructions (Addendum)
______________________________________________________________________  Preparing for your procedure (without sedation)  Procedure appointments are limited to planned procedures: No Prescription Refills. No disability issues will be discussed. No medication changes will be discussed.  Instructions: Food Intake: Avoid eating anything for at least 4 hours prior to your procedure. Transportation: Unless otherwise stated by your physician, bring a driver. Morning Medicines: Take all of your scheduled morning medications. If you take heart medicine, except for blood thinners, do not forget to take it the morning of the procedure. If your Diastolic (lower reading) is above 100 mmHg, elective cases will be cancelled/rescheduled. Blood thinners: These will need to be stopped for procedures. Notify our staff if you are taking any blood thinners. Depending on which one you take, there will be specific instructions on how and when to stop it. Diabetics on insulin: Notify the staff so that you can be scheduled 1st case in the morning. If your diabetes requires high dose insulin, take only  of your normal insulin dose the morning of the procedure and notify the staff that you have done so. Preventing infections: Shower with an antibacterial soap the morning of your procedure.  Build-up your immune system: Take 1000 mg of Vitamin C with every meal (3 times a day) the day prior to your procedure. Antibiotics: Inform the staff if you have a condition or reason that requires you to take antibiotics before dental procedures. Pregnancy: If you are pregnant, call and cancel the procedure. Sickness: If you have a cold, fever, or any active infections, call and cancel the procedure. Arrival: You must be in the facility at least 30 minutes prior to your scheduled procedure. Children: Do not bring any children with you. Dress appropriately: There is always a possibility that your clothing may get soiled. Valuables:  Do not bring any jewelry or valuables.  Reasons to call and reschedule or cancel your procedure: (Following these recommendations will minimize the risk of a serious complication.) Surgeries: Avoid having procedures within 2 weeks of any surgery. (Avoid for 2 weeks before or after any surgery). Flu Shots: Avoid having procedures within 2 weeks of a flu shots or . (Avoid for 2 weeks before or after immunizations). Barium: Avoid having a procedure within 7-10 days after having had a radiological study involving the use of radiological contrast. (Myelograms, Barium swallow or enema study). Heart attacks: Avoid any elective procedures or surgeries for the initial 6 months after a "Myocardial Infarction" (Heart Attack). Blood thinners: It is imperative that you stop these medications before procedures. Let us know if you if you take any blood thinner.  Infection: Avoid procedures during or within two weeks of an infection (including chest colds or gastrointestinal problems). Symptoms associated with infections include: Localized redness, fever, chills, night sweats or profuse sweating, burning sensation when voiding, cough, congestion, stuffiness, runny nose, sore throat, diarrhea, nausea, vomiting, cold or Flu symptoms, recent or current infections. It is specially important if the infection is over the area that we intend to treat. Heart and lung problems: Symptoms that may suggest an active cardiopulmonary problem include: cough, chest pain, breathing difficulties or shortness of breath, dizziness, ankle swelling, uncontrolled high or unusually low blood pressure, and/or palpitations. If you are experiencing any of these symptoms, cancel your procedure and contact your primary care physician for an evaluation.  Remember:  Regular Business hours are:  Monday to Thursday 8:00 AM to 4:00 PM  Provider's Schedule: Milinda Pointer, MD:  Procedure days: Tuesday and Thursday 7:30 AM to 4:00 PM  Bilal  Lateef, MD:  Procedure days: Monday and Wednesday 7:30 AM to 4:00 PM ______________________________________________________________________  ____________________________________________________________________________________________  General Risks and Possible Complications  Patient Responsibilities: It is important that you read this as it is part of your informed consent. It is our duty to inform you of the risks and possible complications associated with treatments offered to you. It is your responsibility as a patient to read this and to ask questions about anything that is not clear or that you believe was not covered in this document.  Patient's Rights: You have the right to refuse treatment. You also have the right to change your mind, even after initially having agreed to have the treatment done. However, under this last option, if you wait until the last second to change your mind, you may be charged for the materials used up to that point.  Introduction: Medicine is not an exact science. Everything in Medicine, including the lack of treatment(s), carries the potential for danger, harm, or loss (which is by definition: Risk). In Medicine, a complication is a secondary problem, condition, or disease that can aggravate an already existing one. All treatments carry the risk of possible complications. The fact that a side effects or complications occurs, does not imply that the treatment was conducted incorrectly. It must be clearly understood that these can happen even when everything is done following the highest safety standards.  No treatment: You can choose not to proceed with the proposed treatment alternative. The "PRO(s)" would include: avoiding the risk of complications associated with the therapy. The "CON(s)" would include: not getting any of the treatment benefits. These benefits fall under one of three categories: diagnostic; therapeutic; and/or palliative. Diagnostic benefits  include: getting information which can ultimately lead to improvement of the disease or symptom(s). Therapeutic benefits are those associated with the successful treatment of the disease. Finally, palliative benefits are those related to the decrease of the primary symptoms, without necessarily curing the condition (example: decreasing the pain from a flare-up of a chronic condition, such as incurable terminal cancer).  General Risks and Complications: These are associated to most interventional treatments. They can occur alone, or in combination. They fall under one of the following six (6) categories: no benefit or worsening of symptoms; bleeding; infection; nerve damage; allergic reactions; and/or death. No benefits or worsening of symptoms: In Medicine there are no guarantees, only probabilities. No healthcare provider can ever guarantee that a medical treatment will work, they can only state the probability that it may. Furthermore, there is always the possibility that the condition may worsen, either directly, or indirectly, as a consequence of the treatment. Bleeding: This is more common if the patient is taking a blood thinner, either prescription or over the counter (example: Goody Powders, Fish oil, Aspirin, Garlic, etc.), or if suffering a condition associated with impaired coagulation (example: Hemophilia, cirrhosis of the liver, low platelet counts, etc.). However, even if you do not have one on these, it can still happen. If you have any of these conditions, or take one of these drugs, make sure to notify your treating physician. Infection: This is more common in patients with a compromised immune system, either due to disease (example: diabetes, cancer, human immunodeficiency virus [HIV], etc.), or due to medications or treatments (example: therapies used to treat cancer and rheumatological diseases). However, even if you do not have one on these, it can still happen. If you have any of these  conditions, or take one of these drugs, make   sure to notify your treating physician. Nerve Damage: This is more common when the treatment is an invasive one, but it can also happen with the use of medications, such as those used in the treatment of cancer. The damage can occur to small secondary nerves, or to large primary ones, such as those in the spinal cord and brain. This damage may be temporary or permanent and it may lead to impairments that can range from temporary numbness to permanent paralysis and/or brain death. Allergic Reactions: Any time a substance or material comes in contact with our body, there is the possibility of an allergic reaction. These can range from a mild skin rash (contact dermatitis) to a severe systemic reaction (anaphylactic reaction), which can result in death. Death: In general, any medical intervention can result in death, most of the time due to an unforeseen complication. ____________________________________________________________________________________________  ____________________________________________________________________________________________  Medication Rules  Purpose: To inform patients, and their family members, of our rules and regulations.  Applies to: All patients receiving prescriptions (written or electronic).  Pharmacy of record: Pharmacy where electronic prescriptions will be sent. If written prescriptions are taken to a different pharmacy, please inform the nursing staff. The pharmacy listed in the electronic medical record should be the one where you would like electronic prescriptions to be sent.  Electronic prescriptions: In compliance with the [facility] Strengthen Opioid Misuse Prevention (STOP) Act of 2017 (Session Conni Elliot [phone]), effective July 19, 2018, all controlled substances must be electronically prescribed. Calling prescriptions to the pharmacy will cease to exist.  Prescription refills: Only during scheduled  appointments. Applies to all prescriptions.  NOTE: The following applies primarily to controlled substances (Opioid* Pain Medications).   Type of encounter (visit): For patients receiving controlled substances, face-to-face visits are required. (Not an option or up to the patient.)  Patient's responsibilities: Pain Pills: Bring all pain pills to every appointment (except for procedure appointments). Pill Bottles: Bring pills in original pharmacy bottle. Always bring the newest bottle. Bring bottle, even if empty. Medication refills: You are responsible for knowing and keeping track of what medications you take and those you need refilled. The day before your appointment: write a list of all prescriptions that need to be refilled. The day of the appointment: give the list to the admitting nurse. Prescriptions will be written only during appointments. No prescriptions will be written on procedure days. If you forget a medication: it will not be "Called in", "Faxed", or "electronically sent". You will need to get another appointment to get these prescribed. No early refills. Do not call asking to have your prescription filled early. Prescription Accuracy: You are responsible for carefully inspecting your prescriptions before leaving our office. Have the discharge nurse carefully go over each prescription with you, before taking them home. Make sure that your name is accurately spelled, that your address is correct. Check the name and dose of your medication to make sure it is accurate. Check the number of pills, and the written instructions to make sure they are clear and accurate. Make sure that you are given enough medication to last until your next medication refill appointment. Taking Medication: Take medication as prescribed. When it comes to controlled substances, taking less pills or less frequently than prescribed is permitted and encouraged. Never take more pills than instructed. Never take  medication more frequently than prescribed.  Inform other Doctors: Always inform, all of your healthcare providers, of all the medications you take. Pain Medication from other Providers: You are not allowed  to accept any additional pain medication from any other Doctor or Healthcare provider. There are two exceptions to this rule. (see below) In the event that you require additional pain medication, you are responsible for notifying us, as stated below. Cough Medicine: Often these contain an opioid, such as codeine or hydrocodone. Never accept or take cough medicine containing these opioids if you are already taking an opioid* medication. The combination may cause respiratory failure and death. Medication Agreement: You are responsible for carefully reading and following our Medication Agreement. This must be signed before receiving any prescriptions from our practice. Safely store a copy of your signed Agreement. Violations to the Agreement will result in no further prescriptions. (Additional copies of our Medication Agreement are available upon request.) Laws, Rules, & Regulations: All patients are expected to follow all [address] and [employer], Rules, [employer]. Ignorance of the Laws does not constitute a valid excuse.  Illegal drugs and Controlled Substances: The use of illegal substances (including, but not limited to marijuana and its derivatives) and/or the illegal use of any controlled substances is strictly prohibited. Violation of this rule may result in the immediate and permanent discontinuation of any and all prescriptions being written by our practice. The use of any illegal substances is prohibited. Adopted CDC guidelines & recommendations: Target dosing levels will be at or below 60 MME/day. Use of benzodiazepines** is not recommended.  Exceptions: There are only two exceptions to the rule of not receiving pain medications from other Healthcare Providers. Exception #1  (Emergencies): In the event of an emergency (i.e.: accident requiring emergency care), you are allowed to receive additional pain medication. However, you are responsible for: As soon as you are able, call our office [phone], at any time of the day or night, and leave a message stating your name, the date and nature of the emergency, and the name and dose of the medication prescribed. In the event that your call is answered by a member of our staff, make sure to document and save the date, time, and the name of the person that took your information.  Exception #2 (Planned Surgery): In the event that you are scheduled by another doctor or dentist to have any type of surgery or procedure, you are allowed (for a period no longer than 30 days), to receive additional pain medication, for the acute post-op pain. However, in this case, you are responsible for picking up a copy of our "Post-op Pain Management for Surgeons" handout, and giving it to your surgeon or dentist. This document is available at our office, and does not require an appointment to obtain it. Simply go to our office during business hours (Monday-Thursday from 8:00 AM to 4:00 PM) (Friday 8:00 AM to 12:00 Noon) or if you have a scheduled appointment with [place], prior to your surgery, and ask for it by name. In addition, you are responsible for: calling our office [phone], at any time of the day or night, and leaving a message stating your name, name of your surgeon, type of surgery, and date of procedure or surgery. Failure to comply with your responsibilities may result in termination of therapy involving the controlled substances. Medication Agreement Violation. Following the above rules, including your responsibilities will help you in avoiding a Medication Agreement Violation ("Breaking your Pain Medication Contract").  *Opioid medications include: morphine, codeine, oxycodone, oxymorphone, hydrocodone, hydromorphone, meperidine,  tramadol, tapentadol, buprenorphine, fentanyl, methadone. **Benzodiazepine medications include: diazepam (Valium), alprazolam (Xanax), clonazepam (Klonopine), lorazepam (Ativan), clorazepate (  Tranxene), chlordiazepoxide (Librium), estazolam (Prosom), oxazepam (Serax), temazepam (Restoril), triazolam (Halcion) (Last updated: 04/15/2021) ____________________________________________________________________________________________  ____________________________________________________________________________________________  Medication Recommendations and Reminders  Applies to: All patients receiving prescriptions (written and/or electronic).  Medication Rules & Regulations: These rules and regulations exist for your safety and that of others. They are not flexible and neither are we. Dismissing or ignoring them will be considered "non-compliance" with medication therapy, resulting in complete and irreversible termination of such therapy. (See document titled "Medication Rules" for more details.) In all conscience, because of safety reasons, we cannot continue providing a therapy where the patient does not follow instructions.  Pharmacy of record:  Definition: This is the pharmacy where your electronic prescriptions will be sent.  We do not endorse any particular pharmacy, however, we have experienced problems with [facility] not securing enough medication supply for the community. We do not restrict you in your choice of pharmacy. However, once we write for your prescriptions, we will NOT be re-sending more prescriptions to fix restricted supply problems created by your pharmacy, or your insurance.  The pharmacy listed in the electronic medical record should be the one where you want electronic prescriptions to be sent. If you choose to change pharmacy, simply notify our nursing staff.  Recommendations: Keep all of your pain medications in a safe place, under lock and key, even if you live alone. We  will NOT replace lost, stolen, or damaged medication. After you fill your prescription, take 1 week's worth of pills and put them away in a safe place. You should keep a separate, properly labeled bottle for this purpose. The remainder should be kept in the original bottle. Use this as your primary supply, until it runs out. Once it's gone, then you know that you have 1 week's worth of medicine, and it is time to come in for a prescription refill. If you do this correctly, it is unlikely that you will ever run out of medicine. To make sure that the above recommendation works, it is very important that you make sure your medication refill appointments are scheduled at least 1 week before you run out of medicine. To do this in an effective manner, make sure that you do not leave the office without scheduling your next medication management appointment. Always ask the nursing staff to show you in your prescription , when your medication will be running out. Then arrange for the receptionist to get you a return appointment, at least 7 days before you run out of medicine. Do not wait until you have 1 or 2 pills left, to come in. This is very poor planning and does not take into consideration that we may need to cancel appointments due to bad weather, sickness, or emergencies affecting our staff. DO NOT ACCEPT A "Partial Fill": If for any reason your pharmacy does not have enough pills/tablets to completely fill or refill your prescription, do not allow for a "partial fill". The law allows the pharmacy to complete that prescription within 72 hours, without requiring a new prescription. If they do not fill the rest of your prescription within those 72 hours, you will need a separate prescription to fill the remaining amount, which we will NOT provide. If the reason for the partial fill is your insurance, you will need to talk to the pharmacist about payment alternatives for the remaining tablets, but again, DO NOT  ACCEPT A PARTIAL FILL, unless you can trust your pharmacist to obtain the remainder of the pills within 72 hours.  Prescription refills and/or changes in medication(s):  Prescription refills, and/or changes in dose or medication, will be conducted only during scheduled medication management appointments. (Applies to both, written and electronic prescriptions.) No refills on procedure days. No medication will be changed or started on procedure days. No changes, adjustments, and/or refills will be conducted on a procedure day. Doing so will interfere with the diagnostic portion of the procedure. No phone refills. No medications will be "called into the pharmacy". No Fax refills. No weekend refills. No Holliday refills. No after hours refills.  Remember:  Business hours are:  Monday to Thursday 8:00 AM to 4:00 PM Provider's Schedule: Delano Metz, MD - Appointments are:  Medication management: Monday and Wednesday 8:00 AM to 4:00 PM Procedure day: Tuesday and Thursday 7:30 AM to 4:00 PM Edward Jolly, MD - Appointments are:  Medication management: Tuesday and Thursday 8:00 AM to 4:00 PM Procedure day: Monday and Wednesday 7:30 AM to 4:00 PM (Last update: 02/06/2020) ____________________________________________________________________________________________  ____________________________________________________________________________________________  CBD (cannabidiol) & Delta-8 (Delta-8 tetrahydrocannabinol) WARNING  Intro: Cannabidiol (CBD) and tetrahydrocannabinol (THC), are two natural compounds found in plants of the Cannabis genus. They can both be extracted from hemp or cannabis. Hemp and cannabis come from the Cannabis sativa plant. Both compounds interact with your body's endocannabinoid system, but they have very different effects. CBD does not produce the high sensation associated with cannabis. Delta-8 tetrahydrocannabinol, also known as delta-8 THC, is a psychoactive substance  found in the Cannabis sativa plant, of which marijuana and hemp are two varieties. THC is responsible for the high associated with the illicit use of marijuana.  Applicable to: All individuals currently taking or considering taking CBD (cannabidiol) and, more important, all patients taking opioid analgesic controlled substances (pain medication). (Example: oxycodone; oxymorphone; hydrocodone; hydromorphone; morphine; methadone; tramadol; tapentadol; fentanyl; buprenorphine; butorphanol; dextromethorphan; meperidine; codeine; etc.)  Legal status: CBD remains a Schedule I drug prohibited for any use. CBD is illegal with one exception. In the [place], CBD has a limited Education officer, environmental (FDA) approval for the treatment of two specific types of epilepsy disorders. Only one CBD product has been approved by the FDA for this purpose: "Epidiolex". FDA is aware that some companies are marketing products containing cannabis and cannabis-derived compounds in ways that violate the [employer], Drug and Cosmetic Act [facility] Act) and that may put the health and safety of consumers at risk. The FDA, a Federal agency, has not enforced the CBD status since 2018. UPDATE: (09/04/2021) The Drug Enforcement Agency (DEA) issued a letter stating that "delta" cannabinoids, including Delta-8-THCO and Delta-9-THCO, synthetically derived from hemp do not qualify as hemp and will be viewed as Schedule I drugs. (Schedule I drugs, substances, or chemicals are defined as drugs with no currently accepted medical use and a high potential for abuse. Some examples of Schedule I drugs are: heroin, lysergic acid diethylamide (LSD), marijuana (cannabis), 3,4-methylenedioxymethamphetamine (ecstasy), methaqualone, and peyote.) ([employer])  Legality: Some manufacturers ship CBD products nationally, which is illegal. Often such products are sold online and are therefore available throughout the country. CBD is openly sold in  head shops and health food stores in some states where such sales have not been explicitly legalized. Selling unapproved products with unsubstantiated therapeutic claims is not only a violation of the law, but also can put patients at risk, as these products have not been proven to be safe or effective. Federal illegality makes it difficult to conduct research on CBD.  Reference: "FDA Regulation of Cannabis  and Cannabis-Derived Products, Including Cannabidiol (CBD)" - [employer]  Warning: CBD is not FDA approved and has not undergo the same manufacturing controls as prescription drugs.  This means that the purity and safety of available CBD may be questionable. Most of the time, despite manufacturer's claims, it is contaminated with THC (delta-9-tetrahydrocannabinol - the chemical in marijuana responsible for the "HIGH").  When this is the case, the [facility] contaminant will trigger a positive urine drug screen (UDS) test for Marijuana (carboxy-THC). Because a positive UDS for any illicit substance is a violation of our medication agreement, your opioid analgesics (pain medicine) may be permanently discontinued. The FDA recently put out a warning about 5 things that everyone should be aware of regarding Delta-8 THC: Delta-8 THC products have not been evaluated or approved by the FDA for safe use and may be marketed in ways that put the public health at risk. The FDA has received adverse event reports involving delta-8 THC-containing products. Delta-8 THC has psychoactive and intoxicating effects. Delta-8 THC manufacturing often involve use of potentially harmful chemicals to create the concentrations of delta-8 THC claimed in the marketplace. The final delta-8 THC product may have potentially harmful by-products (contaminants) due to the chemicals used in the process. Manufacturing of delta-8  THC products may occur in uncontrolled or unsanitary settings, which may lead to the presence of unsafe contaminants or other potentially harmful substances. Delta-8 THC products should be kept out of the reach of children and pets.  MORE ABOUT CBD  General Information: CBD was discovered in [birth year] and it is a derivative of the cannabis sativa genus plants (Marijuana and Hemp). It is one of the 113 identified substances found in Marijuana. It accounts for up to 40% of the plant's extract. As of 2018, preliminary clinical studies on CBD included research for the treatment of anxiety, movement disorders, and pain. CBD is available and consumed in multiple forms, including inhalation of smoke or vapor, as an aerosol spray, and by mouth. It may be supplied as an oil containing CBD, capsules, dried cannabis, or as a liquid solution. CBD is thought not to be as psychoactive as THC (delta-9-tetrahydrocannabinol - the chemical in marijuana responsible for the "HIGH"). Studies suggest that CBD may interact with different biological target receptors in the body, including cannabinoid and other neurotransmitter receptors. As of 2018 the mechanism of action for its biological effects has not been determined.  Side-effects  Adverse reactions: Dry mouth, diarrhea, decreased appetite, fatigue, drowsiness, malaise, weakness, sleep disturbances, and others.  Drug interactions: CBC may interact with other medications such as blood-thinners. Because CBD causes drowsiness on its own, it also increases the drowsiness caused by other medications, including antihistamines (such as Benadryl), benzodiazepines (Xanax, Ativan, Valium), antipsychotics, antidepressants and opioids, as well as alcohol and supplements such as kava, melatonin and St. John's Wort. Be cautious with the following combinations:   Brivaracetam (Briviact) Brivaracetam is changed and broken down by the body. CBD might decrease how quickly the body breaks down  brivaracetam. This might increase levels of brivaracetam in the body.  Caffeine Caffeine is changed and broken down by the body. CBD might decrease how quickly the body breaks down caffeine. This might increase levels of caffeine in the body.  Carbamazepine (Tegretol) Carbamazepine is changed and broken down by the body. CBD might decrease how quickly the body breaks down carbamazepine. This might increase levels of carbamazepine in the body and increase its side effects.  Citalopram (Celexa) Citalopram is changed and broken down by  the body. CBD might decrease how quickly the body breaks down citalopram. This might increase levels of citalopram in the body and increase its side effects.  Clobazam (Onfi) Clobazam is changed and broken down by the liver. CBD might decrease how quickly the liver breaks down clobazam. This might increase the effects and side effects of clobazam.  Eslicarbazepine (Aptiom) Eslicarbazepine is changed and broken down by the body. CBD might decrease how quickly the body breaks down eslicarbazepine. This might increase levels of eslicarbazepine in the body by a small amount.  Everolimus (Zostress) Everolimus is changed and broken down by the body. CBD might decrease how quickly the body breaks down everolimus. This might increase levels of everolimus in the body.  Lithium Taking higher doses of CBD might increase levels of lithium. This can increase the risk of lithium toxicity.  Medications changed by the liver (Cytochrome P450 1A1 (CYP1A1) substrates) Some medications are changed and broken down by the liver. CBD might change how quickly the liver breaks down these medications. This could change the effects and side effects of these medications.  Medications changed by the liver (Cytochrome P450 1A2 (CYP1A2) substrates) Some medications are changed and broken down by the liver. CBD might change how quickly the liver breaks down these medications. This could  change the effects and side effects of these medications.  Medications changed by the liver (Cytochrome P450 1B1 (CYP1B1) substrates) Some medications are changed and broken down by the liver. CBD might change how quickly the liver breaks down these medications. This could change the effects and side effects of these medications.  Medications changed by the liver (Cytochrome P450 2A6 (CYP2A6) substrates) Some medications are changed and broken down by the liver. CBD might change how quickly the liver breaks down these medications. This could change the effects and side effects of these medications.  Medications changed by the liver (Cytochrome P450 2B6 (CYP2B6) substrates) Some medications are changed and broken down by the liver. CBD might change how quickly the liver breaks down these medications. This could change the effects and side effects of these medications.  Medications changed by the liver (Cytochrome P450 2C19 (CYP2C19) substrates) Some medications are changed and broken down by the liver. CBD might change how quickly the liver breaks down these medications. This could change the effects and side effects of these medications.  Medications changed by the liver (Cytochrome P450 2C8 (CYP2C8) substrates) Some medications are changed and broken down by the liver. CBD might change how quickly the liver breaks down these medications. This could change the effects and side effects of these medications.  Medications changed by the liver (Cytochrome P450 2C9 (CYP2C9) substrates) Some medications are changed and broken down by the liver. CBD might change how quickly the liver breaks down these medications. This could change the effects and side effects of these medications.  Medications changed by the liver (Cytochrome P450 2D6 (CYP2D6) substrates) Some medications are changed and broken down by the liver. CBD might change how quickly the liver breaks down these medications. This could change  the effects and side effects of these medications.  Medications changed by the liver (Cytochrome P450 2E1 (CYP2E1) substrates) Some medications are changed and broken down by the liver. CBD might change how quickly the liver breaks down these medications. This could change the effects and side effects of these medications.  Medications changed by the liver (Cytochrome P450 3A4 (CYP3A4) substrates) Some medications are changed and broken down by the liver. CBD might change  how quickly the liver breaks down these medications. This could change the effects and side effects of these medications.  Medications changed by the liver (Glucuronidated drugs) Some medications are changed and broken down by the liver. CBD might change how quickly the liver breaks down these medications. This could change the effects and side effects of these medications.  Medications that decrease the breakdown of other medications by the liver (Cytochrome P450 2C19 (CYP2C19) inhibitors) CBD is changed and broken down by the liver. Some drugs decrease how quickly the liver changes and breaks down CBD. This could change the effects and side effects of CBD.  Medications that decrease the breakdown of other medications in the liver (Cytochrome P450 3A4 (CYP3A4) inhibitors) CBD is changed and broken down by the liver. Some drugs decrease how quickly the liver changes and breaks down CBD. This could change the effects and side effects of CBD.  Medications that increase breakdown of other medications by the liver (Cytochrome P450 3A4 (CYP3A4) inducers) CBD is changed and broken down by the liver. Some drugs increase how quickly the liver changes and breaks down CBD. This could change the effects and side effects of CBD.  Medications that increase the breakdown of other medications by the liver (Cytochrome P450 2C19 (CYP2C19) inducers) CBD is changed and broken down by the liver. Some drugs increase how quickly the liver changes  and breaks down CBD. This could change the effects and side effects of CBD.  Methadone (Dolophine) Methadone is broken down by the liver. CBD might decrease how quickly the liver breaks down methadone. Taking cannabidiol along with methadone might increase the effects and side effects of methadone.  Rufinamide (Banzel) Rufinamide is changed and broken down by the body. CBD might decrease how quickly the body breaks down rufinamide. This might increase levels of rufinamide in the body by a small amount.  Sedative medications (CNS depressants) CBD might cause sleepiness and slowed breathing. Some medications, called sedatives, can also cause sleepiness and slowed breathing. Taking CBD with sedative medications might cause breathing problems and/or too much sleepiness.  Sirolimus (Rapamune) Sirolimus is changed and broken down by the body. CBD might decrease how quickly the body breaks down sirolimus. This might increase levels of sirolimus in the body.  Stiripentol (Diacomit) Stiripentol is changed and broken down by the body. CBD might decrease how quickly the body breaks down stiripentol. This might increase levels of stiripentol in the body and increase its side effects.  Tacrolimus (Prograf) Tacrolimus is changed and broken down by the body. CBD might decrease how quickly the body breaks down tacrolimus. This might increase levels of tacrolimus in the body.  Tamoxifen (Soltamox) Tamoxifen is changed and broken down by the body. CBD might affect how quickly the body breaks down tamoxifen. This might affect levels of tamoxifen in the body.  Topiramate (Topamax) Topiramate is changed and broken down by the body. CBD might decrease how quickly the body breaks down topiramate. This might increase levels of topiramate in the body by a small amount.  Valproate Valproic acid can cause liver injury. Taking cannabidiol with valproic acid might increase the chance of liver injury. CBD and/or  valproic acid might need to be stopped, or the dose might need to be reduced.  Warfarin (Coumadin) CBD might increase levels of warfarin, which can increase the risk for bleeding. CBD and/or warfarin might need to be stopped, or the dose might need to be reduced.  Zonisamide Zonisamide is changed and broken down by the  body. CBD might decrease how quickly the body breaks down zonisamide. This might increase levels of zonisamide in the body by a small amount. (Last update: 09/16/2021) ____________________________________________________________________________________________  ____________________________________________________________________________________________  Drug Holidays (Slow)  What is a "Drug Holiday"? Drug Holiday: is the name given to the period of time during which a patient stops taking a medication(s) for the purpose of eliminating tolerance to the drug.  Benefits Improved effectiveness of opioids. Decreased opioid dose needed to achieve benefits. Improved pain with lesser dose.  What is tolerance? Tolerance: is the progressive decreased in effectiveness of a drug due to its repetitive use. With repetitive use, the body gets use to the medication and as a consequence, it loses its effectiveness. This is a common problem seen with opioid pain medications. As a result, a larger dose of the drug is needed to achieve the same effect that used to be obtained with a smaller dose.  How long should a "Drug Holiday" last? You should stay off of the pain medicine for at least 14 consecutive days. (2 weeks)  Should I stop the medicine "cold [place]"? No. You should always coordinate with your Pain Specialist so that he/she can provide you with the correct medication dose to make the transition as smoothly as possible.  How do I stop the medicine? Slowly. You will be instructed to decrease the daily amount of pills that you take by one (1) pill every seven (7) days. This is called  a "slow downward taper" of your dose. For example: if you normally take four (4) pills per day, you will be asked to drop this dose to three (3) pills per day for seven (7) days, then to two (2) pills per day for seven (7) days, then to one (1) per day for seven (7) days, and at the end of those last seven (7) days, this is when the "Drug Holiday" would start.   Will I have withdrawals? By doing a "slow downward taper" like this one, it is unlikely that you will experience any significant withdrawal symptoms. Typically, what triggers withdrawals is the sudden stop of a high dose opioid therapy. Withdrawals can usually be avoided by slowly decreasing the dose over a prolonged period of time. If you do not follow these instructions and decide to stop your medication abruptly, withdrawals may be possible.  What are withdrawals? Withdrawals: refers to the wide range of symptoms that occur after stopping or dramatically reducing opiate drugs after heavy and prolonged use. Withdrawal symptoms do not occur to patients that use low dose opioids, or those who take the medication sporadically. Contrary to benzodiazepine (example: Valium, Xanax, etc.) or alcohol withdrawals ("Delirium Tremens"), opioid withdrawals are not lethal. Withdrawals are the physical manifestation of the body getting rid of the excess receptors.  Expected Symptoms Early symptoms of withdrawal may include: Agitation Anxiety Muscle aches Increased tearing Insomnia Runny nose Sweating Yawning  Late symptoms of withdrawal may include: Abdominal cramping Diarrhea Dilated pupils Goose bumps Nausea Vomiting  Will I experience withdrawals? Due to the slow nature of the taper, it is very unlikely that you will experience any.  What is a slow taper? Taper: refers to the gradual decrease in dose.  (Last update: 02/06/2020) ____________________________________________________________________________________________    ____________________________________________________________________________________________  Pharmacy Shortages of Pain Medication   Introduction Shockingly as it may seem, .  "No U.[place] Supreme Court decision has ever interpreted the Constitution as guaranteeing a right to health care for all Americans." - OpeningJokes.chhttps://www.healthequityandpolicylab.com/elusive-right-to-health-care-under-us-law  "With respect to  human rights, the [place] has no formally codified right to health, nor does it participate in a human rights treaty that specifies a right to health." - Scott J. Schweikart, JD, MBE  Situation By now, most of our patients have had the experience of being told by their pharmacist that they do not have enough medication to cover their prescription. If you have not had this experience, just know that you soon will.  Problem There appears to be a shortage of these medications, either at the national level or locally. This is happening with all pharmacies. When there is not enough medication, patients are offered a partial fill and they are told that they will try to get the rest of the medicine for them at a later time. If they do not have enough for even a partial fill, the pharmacists are telling the patients to call us (the prescribing physicians) to request that we send another prescription to another pharmacy to get the medicine.   This reordering of a controlled substance creates documentation problems where additional paperwork needs to be created to explain why two prescriptions for the same period of time and the same medicine are being prescribed to the same patient. It also creates situations where the last appointment note does not accurately reflect when and what prescriptions were given to a patient. This leads to prescribing errors down the line, in subsequent follow-up visits.   [employer] of Pharmacy ([employer]) Research revealed that Educational psychologist .1806 (21  [place]AC 46.1806) authorizes pharmacists to the transfer of prescriptions among pharmacies, and it sets forth procedural and recordkeeping requirements for doing so. However, this requires the pharmacist to complete the previously mentioned procedural paperwork to accomplish the transfer. As it turns out, it is much easier for them to have the prescribing physicians do the work.   Possible solutions 1. Have the [facility] Assembly add a provision to the "STOP ACT" (the law that mandates how controlled substances are prescribed) where there is an exception to the electronic prescribing rule that states that in the event there are shortages of medications the physicians are allowed to use written prescriptions as opposed to electronic ones. This would allow patients to take their prescriptions to a different pharmacy that may have enough medication available to fill the prescription. The problem is that currently there is a law that does not allow for written prescriptions, with the exception of instances where the electronic medical record is down due to technical issues.  2. Have [place] Congress ease the pressure on pharmaceutical companies, allowing them to produce enough quantities of the medication to adequately supply the population. 3. Have pharmacies keep enough stocks of these medications to cover their client base.  4. Have the [facility] Assembly add a provision to the "STOP ACT" where they ease the regulations surrounding the transfer of controlled substances between pharmacies, so as to simplify the transfer of supplies. As an alternative, develop a system to allow patients to obtain the remainder of their prescription at another one of their pharmacies or at an associate pharmacy.   How this shortage will affect you.  The one thing that is abundantly clear is that this is a pharmacy supply problem  and not a prescriber problem. The job of the prescriber is to evaluate and monitor the  patients for the appropriate indications to the use of these medicines, the monitoring of their use and the prescribing of the appropriate dose and  regimen. It is not the job of the prescriber to provide or dispense the actual medication. By law, this is the job of the pharmacies and pharmacists. It is certainly not the job of the prescriber to solve the supply problems.   Due to the above problems we are no longer taking patients to write for their pain medication. We will continue to evaluate for appropriate indications and we may provide recommendations regarding medication, dose, and schedule, as well as monitoring recommendations, however, we will not be taking over the actual prescribing of these substances. On those patients where we are treating their chronic pain with interventional therapies, exceptions will be considered on a case by case basis. At this time, we will try to continue providing this supplemental service to those patients we have been managing in the past. However, as of August 1st, 2023, we no longer will be sending additional prescriptions to other pharmacies for the purpose of solving their supply problems. Once we send a prescription to a pharmacy, we will not be resending it again to another pharmacy to cover for their shortages.   What to do. Write as many letters as you can. Recruit the help of family members in writing these letters. Below are some of the places where you can write to make your voice heard. Let them know what the problem is and push them to look for solutions.   Search internet for: "[employer] find your legislators" iixl.com  Search internet for: "[employer] commissioner complaints" RareVoices.pl  Search internet for: "[employer] of Pharmacy complaints" MobLag.com.cy.htm  Search internet for: "[facility] pharmacy  complaints" Email [facility] Customer Relations https://www.graham-perkins.[email or web address]?callType=store  Search internet for: Architectural technologist customer service complaints" https://www.[facility].com/topic/marketing/contactus/contactus_[email or web address]  ____________________________________________________________________________________________

## 2022-01-18 NOTE — Progress Notes (Signed)
Nursing Pain Medication Assessment:  Safety precautions to be maintained throughout the outpatient stay will include: orient to surroundings, keep bed in low position, maintain call bell within reach at all times, provide assistance with transfer out of bed and ambulation.  Medication Inspection Compliance: Pill count conducted under aseptic conditions, in front of the patient. Neither the pills nor the bottle was removed from the patient's sight at any time. Once count was completed pills were immediately returned to the patient in their original bottle.  Medication: See above Pill/Patch Count:  22 of 60 pills remain Pill/Patch Appearance: Markings consistent with prescribed medication Bottle Appearance: Standard pharmacy container. Clearly labeled. Filled Date: 6 / 94 / 2023 Last Medication intake:  Today

## 2022-02-01 ENCOUNTER — Emergency Department: Payer: Medicaid Other

## 2022-02-01 ENCOUNTER — Encounter: Payer: Self-pay | Admitting: Pain Medicine

## 2022-02-01 ENCOUNTER — Other Ambulatory Visit: Payer: Self-pay

## 2022-02-01 ENCOUNTER — Encounter: Payer: Self-pay | Admitting: Emergency Medicine

## 2022-02-01 DIAGNOSIS — R059 Cough, unspecified: Secondary | ICD-10-CM | POA: Insufficient documentation

## 2022-02-01 DIAGNOSIS — M25421 Effusion, right elbow: Secondary | ICD-10-CM | POA: Insufficient documentation

## 2022-02-01 DIAGNOSIS — Z20822 Contact with and (suspected) exposure to covid-19: Secondary | ICD-10-CM | POA: Insufficient documentation

## 2022-02-01 DIAGNOSIS — M545 Low back pain, unspecified: Secondary | ICD-10-CM | POA: Diagnosis not present

## 2022-02-01 DIAGNOSIS — F419 Anxiety disorder, unspecified: Secondary | ICD-10-CM | POA: Insufficient documentation

## 2022-02-01 DIAGNOSIS — Z5321 Procedure and treatment not carried out due to patient leaving prior to being seen by health care provider: Secondary | ICD-10-CM | POA: Insufficient documentation

## 2022-02-01 DIAGNOSIS — R300 Dysuria: Secondary | ICD-10-CM | POA: Diagnosis not present

## 2022-02-01 DIAGNOSIS — R0602 Shortness of breath: Secondary | ICD-10-CM | POA: Insufficient documentation

## 2022-02-01 LAB — COMPREHENSIVE METABOLIC PANEL
ALT: 12 U/L (ref 0–44)
AST: 20 U/L (ref 15–41)
Albumin: 4 g/dL (ref 3.5–5.0)
Alkaline Phosphatase: 62 U/L (ref 38–126)
Anion gap: 16 — ABNORMAL HIGH (ref 5–15)
BUN: <5 mg/dL — ABNORMAL LOW (ref 6–20)
CO2: 19 mmol/L — ABNORMAL LOW (ref 22–32)
Calcium: 9.1 mg/dL (ref 8.9–10.3)
Chloride: 97 mmol/L — ABNORMAL LOW (ref 98–111)
Creatinine, Ser: 0.88 mg/dL (ref 0.44–1.00)
GFR, Estimated: >60 mL/min (ref >60)
Glucose, Bld: 158 mg/dL — ABNORMAL HIGH (ref 70–99)
Potassium: 2.9 mmol/L — ABNORMAL LOW (ref 3.5–5.1)
Sodium: 132 mmol/L — ABNORMAL LOW (ref 135–145)
Total Bilirubin: 0.8 mg/dL (ref 0.3–1.2)
Total Protein: 7.8 g/dL (ref 6.5–8.1)

## 2022-02-01 LAB — CBC WITH DIFFERENTIAL/PLATELET
Abs Immature Granulocytes: 0.03 10*3/uL (ref 0.00–0.07)
Basophils Absolute: 0.1 10*3/uL (ref 0.0–0.1)
Basophils Relative: 1 %
Eosinophils Absolute: 0.3 10*3/uL (ref 0.0–0.5)
Eosinophils Relative: 3 %
HCT: 41.9 % (ref 36.0–46.0)
Hemoglobin: 14 g/dL (ref 12.0–15.0)
Immature Granulocytes: 0 %
Lymphocytes Relative: 26 %
Lymphs Abs: 3.3 10*3/uL (ref 0.7–4.0)
MCH: 31.4 pg (ref 26.0–34.0)
MCHC: 33.4 g/dL (ref 30.0–36.0)
MCV: 93.9 fL (ref 80.0–100.0)
Monocytes Absolute: 0.6 10*3/uL (ref 0.1–1.0)
Monocytes Relative: 5 %
Neutro Abs: 8.4 10*3/uL — ABNORMAL HIGH (ref 1.7–7.7)
Neutrophils Relative %: 65 %
Platelets: 285 10*3/uL (ref 150–400)
RBC: 4.46 MIL/uL (ref 3.87–5.11)
RDW: 14.9 % (ref 11.5–15.5)
WBC: 12.8 10*3/uL — ABNORMAL HIGH (ref 4.0–10.5)
nRBC: 0 % (ref 0.0–0.2)

## 2022-02-01 LAB — URINALYSIS, ROUTINE W REFLEX MICROSCOPIC
Bacteria, UA: NONE SEEN
Bilirubin Urine: NEGATIVE
Glucose, UA: NEGATIVE mg/dL
Hgb urine dipstick: NEGATIVE
Ketones, ur: NEGATIVE mg/dL
Nitrite: NEGATIVE
Protein, ur: NEGATIVE mg/dL
Specific Gravity, Urine: 1.002 — ABNORMAL LOW (ref 1.005–1.030)
pH: 5 (ref 5.0–8.0)

## 2022-02-01 LAB — TROPONIN I (HIGH SENSITIVITY): Troponin I (High Sensitivity): 5 ng/L (ref <18)

## 2022-02-01 LAB — SARS CORONAVIRUS 2 BY RT PCR: SARS Coronavirus 2 by RT PCR: NEGATIVE

## 2022-02-01 NOTE — ED Triage Notes (Addendum)
Pt to triage via w/c, hyperventilating; st prod cough clear sputum x 2 days with [facility] and lower back pain with diff voiding; st hx panic attacks and out of her klonopin

## 2022-02-01 NOTE — ED Provider Triage Note (Signed)
  Emergency Medicine Provider Triage Evaluation Note  DENEANE STIFTER , a 56 y.o.female,  was evaluated in triage.  Pt complains of shortness of breath x3 days as well as dysuria recently.  She states that she is additionally been having several panic attacks lately and has recently ran out of her Klonopin.   Review of Systems  Positive: Shortness of breath, anxiety, dysuria Negative: Denies fever, chest pain, vomiting  Physical Exam   Vitals:   02/01/22 2210  BP: 111/60  Pulse: 90  Resp: 20  Temp: 98.8 F (37.1 C)  SpO2: 100%   Gen:   Awake, no distress   Resp:  Normal effort  MSK:   Moves extremities without difficulty  Other:    Medical Decision Making  Given the patient's initial medical screening exam, the following diagnostic evaluation has been ordered. The patient will be placed in the appropriate treatment space, once one is available, to complete the evaluation and treatment. I have discussed the plan of care with the patient and I have advised the patient that an ED physician or mid-level practitioner will reevaluate their condition after the test results have been received, as the results may give them additional insight into the type of treatment they may need.    Diagnostics: Labs, CXR, EKG, UA, respiratory panel  Treatments: none immediately   Varney Daily, [place] 02/01/22 2224

## 2022-02-02 ENCOUNTER — Encounter: Payer: Self-pay | Admitting: Emergency Medicine

## 2022-02-02 ENCOUNTER — Emergency Department
Admission: EM | Admit: 2022-02-02 | Discharge: 2022-02-02 | Discharge status: Against Medical Advice | Payer: Medicaid Other | Attending: Emergency Medicine | Admitting: Emergency Medicine

## 2022-02-02 ENCOUNTER — Emergency Department: Payer: Medicaid Other

## 2022-02-02 ENCOUNTER — Emergency Department
Admission: EM | Admit: 2022-02-02 | Discharge: 2022-02-02 | Disposition: A | Discharge status: Home / Self Care | Payer: Medicaid Other | Attending: Emergency Medicine | Admitting: Emergency Medicine

## 2022-02-02 DIAGNOSIS — J449 Chronic obstructive pulmonary disease, unspecified: Secondary | ICD-10-CM | POA: Diagnosis not present

## 2022-02-02 DIAGNOSIS — I509 Heart failure, unspecified: Secondary | ICD-10-CM | POA: Diagnosis not present

## 2022-02-02 DIAGNOSIS — R0602 Shortness of breath: Secondary | ICD-10-CM | POA: Insufficient documentation

## 2022-02-02 DIAGNOSIS — F17210 Nicotine dependence, cigarettes, uncomplicated: Secondary | ICD-10-CM | POA: Diagnosis not present

## 2022-02-02 LAB — CBC WITH DIFFERENTIAL/PLATELET
Abs Immature Granulocytes: 0.02 10*3/uL (ref 0.00–0.07)
Basophils Absolute: 0.1 10*3/uL (ref 0.0–0.1)
Basophils Relative: 1 %
Eosinophils Absolute: 0.3 10*3/uL (ref 0.0–0.5)
Eosinophils Relative: 3 %
HCT: 42.8 % (ref 36.0–46.0)
Hemoglobin: 14.7 g/dL (ref 12.0–15.0)
Immature Granulocytes: 0 %
Lymphocytes Relative: 22 %
Lymphs Abs: 2.1 10*3/uL (ref 0.7–4.0)
MCH: 31.6 pg (ref 26.0–34.0)
MCHC: 34.3 g/dL (ref 30.0–36.0)
MCV: 92 fL (ref 80.0–100.0)
Monocytes Absolute: 0.7 10*3/uL (ref 0.1–1.0)
Monocytes Relative: 8 %
Neutro Abs: 6.2 10*3/uL (ref 1.7–7.7)
Neutrophils Relative %: 66 %
Platelets: 309 10*3/uL (ref 150–400)
RBC: 4.65 MIL/uL (ref 3.87–5.11)
RDW: 14.8 % (ref 11.5–15.5)
WBC: 9.3 10*3/uL (ref 4.0–10.5)
nRBC: 0 % (ref 0.0–0.2)

## 2022-02-02 LAB — COMPREHENSIVE METABOLIC PANEL
ALT: 16 U/L (ref 0–44)
AST: 35 U/L (ref 15–41)
Albumin: 4.2 g/dL (ref 3.5–5.0)
Alkaline Phosphatase: 61 U/L (ref 38–126)
Anion gap: 17 — ABNORMAL HIGH (ref 5–15)
BUN: <5 mg/dL — ABNORMAL LOW (ref 6–20)
CO2: 24 mmol/L (ref 22–32)
Calcium: 9.7 mg/dL (ref 8.9–10.3)
Chloride: 96 mmol/L — ABNORMAL LOW (ref 98–111)
Creatinine, Ser: 0.86 mg/dL (ref 0.44–1.00)
GFR, Estimated: >60 mL/min (ref >60)
Glucose, Bld: 153 mg/dL — ABNORMAL HIGH (ref 70–99)
Potassium: 2.9 mmol/L — ABNORMAL LOW (ref 3.5–5.1)
Sodium: 137 mmol/L (ref 135–145)
Total Bilirubin: 0.7 mg/dL (ref 0.3–1.2)
Total Protein: 8.1 g/dL (ref 6.5–8.1)

## 2022-02-02 LAB — TROPONIN I (HIGH SENSITIVITY): Troponin I (High Sensitivity): 5 ng/L (ref <18)

## 2022-02-02 LAB — BRAIN NATRIURETIC PEPTIDE: B Natriuretic Peptide: 17 pg/mL (ref 0.0–100.0)

## 2022-02-02 LAB — MAGNESIUM: Magnesium: 1.6 mg/dL — ABNORMAL LOW (ref 1.7–2.4)

## 2022-02-02 MED ORDER — LORAZEPAM 1 MG PO TABS
1.0000 mg | ORAL_TABLET | Freq: Once | ORAL | Status: AC
  Administered 2022-02-02: 1 mg via ORAL
  Filled 2022-02-02: qty 1

## 2022-02-02 NOTE — ED Notes (Addendum)
No answer when called several times from lobby; no answer when phone # listed on chart called 

## 2022-02-02 NOTE — ED Notes (Signed)
Lab notified to add on BNP 

## 2022-02-02 NOTE — Discharge Instructions (Addendum)
Please make sure you are limiting yourself to 2 L of fluid per day while taking your Lasix.

## 2022-02-02 NOTE — ED Notes (Signed)
No answer when called several times from lobby 

## 2022-02-02 NOTE — ED Provider Triage Note (Signed)
Emergency Medicine Provider Triage Evaluation Note  Erica [name], a 56 y.o. female  was evaluated in triage.  Pt complains of shortness of breath.  Review of Systems  Positive: SOB  Negative: CP  Physical Exam  BP (!) 147/84 (BP Location: Right Arm)   Pulse (!) 103   Temp 98 F (36.7 C) (Oral)   Resp (!) 22   LMP 03/13/2015 (Approximate) Comment: more than 2 years ago  SpO2 98%  Gen:   Awake, no distress  NAD Resp:  Normal effort  MSK:   Moves extremities without difficulty  Other:    Medical Decision Making  Medically screening exam initiated at 7:25 PM.  Appropriate orders placed.  Erica [name] was informed that the remainder of the evaluation will be completed by another provider, this initial triage assessment does not replace that evaluation, and the importance of remaining in the ED until their evaluation is complete.  Patient to the ED for evaluation of shortness of breath.   Erica Hoard, PA-C 02/02/22 1926

## 2022-02-02 NOTE — ED Notes (Signed)
Pt reports she has been trying to contact the drs who have prescribed her psych drugs in the past. Cannot get in touch with anyone. IS on 2L [place] continuously but gets feelings of "air hunger" at times that causes anxiety.

## 2022-02-02 NOTE — ED Provider Notes (Signed)
[facility] Provider Note   Event Date/Time   First MD Initiated Contact with Patient 02/02/22 2110     (approximate) History  Shortness of Breath  HPI Erica [name] is a 56 y.o. female with a stated past medical history of anxiety and COPD with continued tobacco abuse who presents for shortness of breath with increased anxiety after running out of her Klonopin recently.  Patient states that she has been taking her other regularly scheduled COPD medications on time and as prescribed.  Patient states she has made multiple attempts to follow-up in her psychiatrist office but they have not been open. ROS: Patient currently denies any vision changes, tinnitus, difficulty speaking, facial droop, sore throat, chest pain, abdominal pain, nausea/vomiting/diarrhea, dysuria, or weakness/numbness/paresthesias in any extremity   Physical Exam  Triage Vital Signs: ED Triage Vitals  Enc Vitals Group     BP 02/02/22 1914 (!) 147/84     Pulse Rate 02/02/22 1914 (!) 103     Resp 02/02/22 1914 (!) 22     Temp 02/02/22 1914 98 F (36.7 C)     Temp Source 02/02/22 1914 Oral     SpO2 02/02/22 1914 98 %     Weight 02/02/22 1925 199 lb 15.3 oz (90.7 kg)     Height 02/02/22 1925 5\' 8"  (1.727 m)     Head Circumference --      Peak Flow --      Pain Score --      Pain Loc --      Pain Edu? --      Excl. in GC? --    Most recent vital signs: Vitals:   02/02/22 2200 02/02/22 2305  BP: 138/68 134/67  Pulse: 90 72  Resp: (!) 24 18  Temp:    SpO2: 95% 99%   General: Awake, oriented x4. CV:  Good peripheral perfusion.  Resp:  Normal effort.  Abd:  No distention.  Other:  Middle-aged overweight Caucasian female laying in bed in mild distress secondary to anxiety ED Results / Procedures / Treatments  Labs (all labs ordered are listed, but only abnormal results are displayed) Labs Reviewed  COMPREHENSIVE METABOLIC PANEL - Abnormal; Notable for the following components:       Result Value   Potassium 2.9 (*)    Chloride 96 (*)    Glucose, Bld 153 (*)    BUN <5 (*)    Anion gap 17 (*)    All other components within normal limits  CBC WITH DIFFERENTIAL/PLATELET  BRAIN NATRIURETIC PEPTIDE  TROPONIN I (HIGH SENSITIVITY)   EKG ED ECG REPORT I, 02/04/22, the attending physician, personally viewed and interpreted this ECG. Date: 02/02/2022 EKG Time: 1919 Rate: 94 Rhythm: normal sinus rhythm QRS Axis: normal Intervals: normal ST/T Wave abnormalities: normal Narrative Interpretation: no evidence of acute ischemia RADIOLOGY ED MD interpretation: 2 view chest x-ray interpreted by me and shows right-sided peribronchial thickening with ill-defined infrahilar opacity -Agree with radiology assessment Official radiology report(s): No results found. PROCEDURES: Critical Care performed: No .1-3 Lead EKG Interpretation  Performed by: 1920, MD Authorized by: Merwyn Katos, MD     Interpretation: normal     ECG rate:  73   ECG rate assessment: normal     Rhythm: sinus rhythm     Ectopy: none     Conduction: normal    MEDICATIONS ORDERED IN ED: Medications  LORazepam (ATIVAN) tablet 1 mg (1 mg Oral Given 02/02/22  2245)   IMPRESSION / MDM / ASSESSMENT AND PLAN / ED COURSE  I reviewed the triage vital signs and the nursing notes.                             The patient is on the cardiac monitor to evaluate for evidence of arrhythmia and/or significant heart rate changes. Patient's presentation is most consistent with acute presentation with potential threat to life or bodily function. Otherwise healthy patient presenting with constellation of symptoms likely representing uncomplicated viral upper respiratory symptoms as characterized by mild bronchitis  Unlikely PTA/RPA: no hot potato voice, no uvular deviation, Unlikely Esophageal rupture: No history of dysphagia Unlikely deep space infection/Ludwigs Low suspicion for CNS infection  bacterial sinusitis, or pneumonia given exam and history.  Unlikely Strep or EBV as centor negative and with no pharyngeal exudate, posterior LAD, or splenomegaly.  Will attempt to alleviate symptoms conservatively; no overt indications at this time for antibiotics. No respiratory distress, otherwise relatively well appearing and nontoxic. Will discuss prompt follow up with PMD and strict return precautions.   FINAL CLINICAL IMPRESSION(S) / ED DIAGNOSES   Final diagnoses:  SOB (shortness of breath)  Chronic congestive heart failure, unspecified heart failure type (HCC)   Rx / DC Orders   ED Discharge Orders     None      Note:  This document was prepared using Dragon voice recognition software and may include unintentional dictation errors.   Merwyn Katos, MD 02/06/22 1131

## 2022-02-02 NOTE — ED Triage Notes (Signed)
Patient ambulatory to triage with steady gait, without difficulty or distress noted; pt here last night but left without being seen; here for persistent SHOB, st hx of same with panic attacks and out of her klonopin

## 2022-04-12 ENCOUNTER — Encounter: Payer: Self-pay | Admitting: Urology

## 2022-04-12 ENCOUNTER — Ambulatory Visit: Payer: Medicaid Other | Admitting: Urology

## 2022-04-19 ENCOUNTER — Encounter: Payer: Medicaid Other | Admitting: Pain Medicine

## 2022-04-22 ENCOUNTER — Emergency Department: Payer: Medicaid Other

## 2022-04-22 ENCOUNTER — Ambulatory Visit: Payer: Medicaid Other | Attending: Pain Medicine | Admitting: Pain Medicine

## 2022-04-22 ENCOUNTER — Encounter: Payer: Self-pay | Admitting: Pain Medicine

## 2022-04-22 ENCOUNTER — Emergency Department
Admission: EM | Admit: 2022-04-22 | Discharge: 2022-04-22 | Disposition: A | Discharge status: Home / Self Care | Payer: Medicaid Other | Attending: Student in an Organized Health Care Education/Training Program | Admitting: Student in an Organized Health Care Education/Training Program

## 2022-04-22 ENCOUNTER — Other Ambulatory Visit: Payer: Self-pay

## 2022-04-22 VITALS — BP 147/101 | HR 117 | Temp 97.3°F | Ht 68.0 in | Wt 199.0 lb

## 2022-04-22 DIAGNOSIS — M533 Sacrococcygeal disorders, not elsewhere classified: Secondary | ICD-10-CM | POA: Insufficient documentation

## 2022-04-22 DIAGNOSIS — M5030 Other cervical disc degeneration, unspecified cervical region: Secondary | ICD-10-CM | POA: Diagnosis present

## 2022-04-22 DIAGNOSIS — M79604 Pain in right leg: Secondary | ICD-10-CM | POA: Diagnosis present

## 2022-04-22 DIAGNOSIS — M542 Cervicalgia: Secondary | ICD-10-CM | POA: Insufficient documentation

## 2022-04-22 DIAGNOSIS — G894 Chronic pain syndrome: Secondary | ICD-10-CM | POA: Insufficient documentation

## 2022-04-22 DIAGNOSIS — L02419 Cutaneous abscess of limb, unspecified: Secondary | ICD-10-CM | POA: Insufficient documentation

## 2022-04-22 DIAGNOSIS — E119 Type 2 diabetes mellitus without complications: Secondary | ICD-10-CM | POA: Insufficient documentation

## 2022-04-22 DIAGNOSIS — M79642 Pain in left hand: Secondary | ICD-10-CM | POA: Insufficient documentation

## 2022-04-22 DIAGNOSIS — M25551 Pain in right hip: Secondary | ICD-10-CM | POA: Insufficient documentation

## 2022-04-22 DIAGNOSIS — M79674 Pain in right toe(s): Secondary | ICD-10-CM | POA: Diagnosis present

## 2022-04-22 DIAGNOSIS — M79641 Pain in right hand: Secondary | ICD-10-CM | POA: Insufficient documentation

## 2022-04-22 DIAGNOSIS — M549 Dorsalgia, unspecified: Secondary | ICD-10-CM | POA: Diagnosis present

## 2022-04-22 DIAGNOSIS — M5137 Other intervertebral disc degeneration, lumbosacral region: Secondary | ICD-10-CM | POA: Insufficient documentation

## 2022-04-22 DIAGNOSIS — J449 Chronic obstructive pulmonary disease, unspecified: Secondary | ICD-10-CM | POA: Diagnosis not present

## 2022-04-22 DIAGNOSIS — M25562 Pain in left knee: Secondary | ICD-10-CM | POA: Diagnosis present

## 2022-04-22 DIAGNOSIS — M545 Low back pain, unspecified: Secondary | ICD-10-CM | POA: Insufficient documentation

## 2022-04-22 DIAGNOSIS — L03119 Cellulitis of unspecified part of limb: Secondary | ICD-10-CM | POA: Insufficient documentation

## 2022-04-22 DIAGNOSIS — M7918 Myalgia, other site: Secondary | ICD-10-CM | POA: Diagnosis present

## 2022-04-22 DIAGNOSIS — G8929 Other chronic pain: Secondary | ICD-10-CM | POA: Insufficient documentation

## 2022-04-22 DIAGNOSIS — M25552 Pain in left hip: Secondary | ICD-10-CM | POA: Insufficient documentation

## 2022-04-22 DIAGNOSIS — Z79899 Other long term (current) drug therapy: Secondary | ICD-10-CM | POA: Diagnosis present

## 2022-04-22 DIAGNOSIS — M79675 Pain in left toe(s): Secondary | ICD-10-CM | POA: Diagnosis present

## 2022-04-22 DIAGNOSIS — Z79891 Long term (current) use of opiate analgesic: Secondary | ICD-10-CM | POA: Insufficient documentation

## 2022-04-22 DIAGNOSIS — M25561 Pain in right knee: Secondary | ICD-10-CM | POA: Diagnosis present

## 2022-04-22 DIAGNOSIS — L03115 Cellulitis of right lower limb: Secondary | ICD-10-CM | POA: Diagnosis not present

## 2022-04-22 LAB — COMPREHENSIVE METABOLIC PANEL
ALT: 16 U/L (ref 0–44)
AST: 31 U/L (ref 15–41)
Albumin: 3.4 g/dL — ABNORMAL LOW (ref 3.5–5.0)
Alkaline Phosphatase: 87 U/L (ref 38–126)
Anion gap: 11 (ref 5–15)
BUN: 10 mg/dL (ref 6–20)
CO2: 26 mmol/L (ref 22–32)
Calcium: 9.2 mg/dL (ref 8.9–10.3)
Chloride: 101 mmol/L (ref 98–111)
Creatinine, Ser: 0.67 mg/dL (ref 0.44–1.00)
GFR, Estimated: >60 mL/min (ref >60)
Glucose, Bld: 141 mg/dL — ABNORMAL HIGH (ref 70–99)
Potassium: 3.9 mmol/L (ref 3.5–5.1)
Sodium: 138 mmol/L (ref 135–145)
Total Bilirubin: 0.8 mg/dL (ref 0.3–1.2)
Total Protein: 6.8 g/dL (ref 6.5–8.1)

## 2022-04-22 LAB — CBC WITH DIFFERENTIAL/PLATELET
Abs Immature Granulocytes: 0.02 10*3/uL (ref 0.00–0.07)
Basophils Absolute: 0.1 10*3/uL (ref 0.0–0.1)
Basophils Relative: 1 %
Eosinophils Absolute: 0.1 10*3/uL (ref 0.0–0.5)
Eosinophils Relative: 2 %
HCT: 46.3 % — ABNORMAL HIGH (ref 36.0–46.0)
Hemoglobin: 15.1 g/dL — ABNORMAL HIGH (ref 12.0–15.0)
Immature Granulocytes: 0 %
Lymphocytes Relative: 24 %
Lymphs Abs: 2.3 10*3/uL (ref 0.7–4.0)
MCH: 31.9 pg (ref 26.0–34.0)
MCHC: 32.6 g/dL (ref 30.0–36.0)
MCV: 97.7 fL (ref 80.0–100.0)
Monocytes Absolute: 0.6 10*3/uL (ref 0.1–1.0)
Monocytes Relative: 6 %
Neutro Abs: 6.4 10*3/uL (ref 1.7–7.7)
Neutrophils Relative %: 67 %
Platelets: 235 10*3/uL (ref 150–400)
RBC: 4.74 MIL/uL (ref 3.87–5.11)
RDW: 14.1 % (ref 11.5–15.5)
WBC: 9.6 10*3/uL (ref 4.0–10.5)
nRBC: 0 % (ref 0.0–0.2)

## 2022-04-22 LAB — LACTIC ACID, PLASMA
Lactic Acid, Venous: 3.8 mmol/L (ref 0.5–1.9)
Lactic Acid, Venous: 1.7 mmol/L (ref 0.5–1.9)

## 2022-04-22 MED ORDER — VANCOMYCIN HCL IN DEXTROSE 1-5 GM/200ML-% IV SOLN: 1000.0000 mg | Freq: Once | INTRAVENOUS | Status: DC

## 2022-04-22 MED ORDER — PROBIOTIC 250 MG PO CAPS: 1.0000 | ORAL_CAPSULE | Freq: Two times a day (BID) | ORAL | 0 refills | Status: DC

## 2022-04-22 MED ORDER — LACTATED RINGERS IV SOLN: INTRAVENOUS | Status: DC

## 2022-04-22 MED ORDER — LIDOCAINE HCL (PF) 1 % IJ SOLN
5.0000 mL | Freq: Once | INTRAMUSCULAR | Status: DC
  Filled 2022-04-22: qty 5

## 2022-04-22 MED ORDER — NALOXONE HCL 4 MG/0.1ML NA LIQD: 1.0000 | NASAL | 0 refills | Status: DC | PRN

## 2022-04-22 MED ORDER — AMOXICILLIN-POT CLAVULANATE 500-125 MG PO TABS: 1.0000 | ORAL_TABLET | Freq: Two times a day (BID) | ORAL | 0 refills | Status: AC

## 2022-04-22 MED ORDER — VANCOMYCIN HCL 2000 MG/400ML IV SOLN
2000.0000 mg | Freq: Once | INTRAVENOUS | Status: AC
  Administered 2022-04-22: 2000 mg via INTRAVENOUS
  Filled 2022-04-22: qty 400

## 2022-04-22 MED ORDER — MORPHINE SULFATE ER 15 MG PO TBCR: 15.0000 mg | EXTENDED_RELEASE_TABLET | Freq: Two times a day (BID) | ORAL | 0 refills | Status: DC

## 2022-04-22 MED ORDER — SODIUM CHLORIDE 0.9 % IV SOLN
2.0000 g | Freq: Once | INTRAVENOUS | Status: AC
  Administered 2022-04-22: 2 g via INTRAVENOUS
  Filled 2022-04-22: qty 12.5

## 2022-04-22 MED ORDER — MORPHINE SULFATE ER 15 MG PO TBCR
15.0000 mg | EXTENDED_RELEASE_TABLET | Freq: Two times a day (BID) | ORAL | Status: DC
  Administered 2022-04-22: 15 mg via ORAL
  Filled 2022-04-22: qty 1
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
## 2022-04-22 NOTE — Consult Note (Signed)
PHARMACY -  BRIEF ANTIBIOTIC NOTE   Pharmacy has received consult(s) for cefepime and vancomycin from an ED provider.  The patient's profile has been reviewed for ht/wt/allergies/indication/available labs.    One time order(s) placed for  Cefepime 2 gram Vancomycin 2 gram   Further antibiotics/pharmacy consults should be ordered by admitting physician if indicated.                       Thank you, Dorothe Pea, PharmD, BCPS Clinical Pharmacist   04/22/2022  7:02 PM

## 2022-04-27 LAB — CULTURE, BLOOD (SINGLE)
Culture: NO GROWTH
Special Requests: ADEQUATE

## 2022-07-21 ENCOUNTER — Ambulatory Visit (HOSPITAL_BASED_OUTPATIENT_CLINIC_OR_DEPARTMENT_OTHER): Payer: Medicaid Other | Admitting: Pain Medicine

## 2022-07-21 DIAGNOSIS — Z91199 Patient's noncompliance with other medical treatment and regimen due to unspecified reason: Secondary | ICD-10-CM

## 2022-07-21 NOTE — Progress Notes (Signed)
NO-SHOW to 07/21/2022 appointment. 

## 2022-07-21 NOTE — Patient Instructions (Signed)
____________________________________________________________________________________________  Patient Information update  To: All of our patients.  Re: Name change.  It has been made official that our current name, "[facility]"   will soon be changed to "[facility]".   The purpose of this change is to eliminate any confusion created by the concept of our practice being a "Medication Management Pain Clinic". In the past this has led to the misconception that we treat pain primarily by the use of prescription medications.  Nothing can be farther from the truth.   Understanding PAIN MANAGEMENT: To further understand what our practice does, you first have to understand that "Pain Management" is a subspecialty that requires additional training once a physician has completed their specialty training, which can be in either Anesthesia, Neurology, Psychiatry, or Physical Medicine and Rehabilitation (PMR). Each one of these contributes to the final approach taken by each physician to the management of their patient's pain. To be a "Pain Management Specialist" you must have first completed one of the specialty trainings below.  Anesthesiologists - trained in clinical pharmacology and interventional techniques such as nerve blockade and regional as well as central neuroanatomy. They are trained to block pain before, during, and after surgical interventions.  Neurologists - trained in the diagnosis and pharmacological treatment of complex neurological conditions, such as Multiple Sclerosis, Parkinson's, spinal cord injuries, and other systemic conditions that may be associated with symptoms that may include but are not limited to pain. They tend to rely primarily on the treatment of chronic pain using prescription medications.  Psychiatrist - trained in conditions affecting the psychosocial  wellbeing of patients including but not limited to depression, anxiety, schizophrenia, personality disorders, addiction, and other substance use disorders that may be associated with chronic pain. They tend to rely primarily on the treatment of chronic pain using prescription medications.   Physical Medicine and Rehabilitation (PMR) physicians, also known as physiatrists - trained to treat a wide variety of medical conditions affecting the brain, spinal cord, nerves, bones, joints, ligaments, muscles, and tendons. Their training is primarily aimed at treating patients that have suffered injuries that have caused severe physical impairment. Their training is primarily aimed at the physical therapy and rehabilitation of those patients. They may also work alongside orthopedic surgeons or neurosurgeons using their expertise in assisting surgical patients to recover after their surgeries.  INTERVENTIONAL PAIN MANAGEMENT is sub-subspecialty of Pain Management.  Our physicians are Board-certified in Anesthesia, Pain Management, and Interventional Pain Management.  This meaning that not only have they been trained and Board-certified in their specialty of Anesthesia, and subspecialty of Pain Management, but they have also received further training in the sub-subspecialty of Interventional Pain Management, in order to become Board-certified as INTERVENTIONAL PAIN MANAGEMENT SPECIALIST.    Mission: Our goal is to use our skills in  INTERVENTIONAL PAIN MANAGEMENT as alternatives to the chronic use of prescription opioid medications for the treatment of pain. To make this more clear, we have changed our name to reflect what we do and offer. We will continue to offer medication management assessment and recommendations, but we will not be taking over any patient's medication management.  ____________________________________________________________________________________________      ____________________________________________________________________________________________  National Pain Medication Shortage  The [place] is experiencing worsening drug shortages. These have had a negative widespread effect on patient care and treatment. Not expected to improve any time soon. Predicted to last past 2029.   Drug shortage list (generic   names) Oxycodone IR Oxycodone/APAP Oxymorphone IR Hydromorphone Hydrocodone/APAP Morphine  Where is the problem?  Manufacturing and supply level.  Will this shortage affect you?  Only if you take any of the above pain medications.  How? You may be unable to fill your prescription.  Your pharmacist may offer a "partial fill" of your prescription. (Warning: Do not accept partial fills.) Prescriptions partially filled cannot be transferred to another pharmacy. Read our Medication Rules and Regulation. Depending on how much medicine you are dependent on, you may experience withdrawals when unable to get the medication.  Recommendations: Consider ending your dependence on opioid pain medications. Ask your pain specialist to assist you with the process. Consider switching to a medication currently not in shortage, such as Buprenorphine. Talk to your pain specialist about this option. Consider decreasing your pain medication requirements by managing tolerance thru "Drug Holidays". This may help minimize withdrawals, should you run out of medicine. Control your pain thru the use of non-pharmacological interventional therapies.   Your prescriber: Prescribers cannot be blamed for shortages. Medication manufacturing and supply issues cannot be fixed by the prescriber.   NOTE: The prescriber is not responsible for supplying the medication, or solving supply issues. Work with your pharmacist to solve it. The patient is responsible for the decision to take or continue taking the medication and for identifying and securing a legal supply source. By  law, supplying the medication is the job and responsibility of the pharmacy. The prescriber is responsible for the evaluation, monitoring, and prescribing of these medications.   Prescribers will NOT: Re-issue prescriptions that have been partially filled. Re-issue prescriptions already sent to a pharmacy.  Re-send prescriptions to a different pharmacy because yours did not have your medication. Ask pharmacist to order more medicine or transfer the prescription to another pharmacy. (Read below.)  New 2023 regulation: "March 19, 2022 Revised Regulation Allows DEA-Registered Pharmacies to Transfer Electronic Prescriptions at a Patient's Request DEA Headquarters Division - Public Information Office Patients now have the ability to request their electronic prescription be transferred to another pharmacy without having to go back to their practitioner to initiate the request. This revised regulation went into effect on Monday, March 15, 2022.     At a patient's request, a DEA-registered retail pharmacy can now transfer an electronic prescription for a controlled substance (schedules II-V) to another DEA-registered retail pharmacy. Prior to this change, patients would have to go through their practitioner to cancel their prescription and have it re-issued to a different pharmacy. The process was taxing and time consuming for both patients and practitioners.    The Drug Enforcement Administration (DEA) published its intent to revise the process for transferring electronic prescriptions on June 06, 2020.  The final rule was published in the federal register on February 11, 2022 and went into effect 30 days later.  Under the final rule, a prescription can only be transferred once between pharmacies, and only if allowed under existing state or other applicable law. The prescription must remain in its electronic form; may not be altered in any way; and the transfer must be communicated directly between  two licensed pharmacists. It's important to note, any authorized refills transfer with the original prescription, which means the entire prescription will be filled at the same pharmacy".  Reference: https://www.dea.gov/stories/2023/2023-03/2022-09-01/revised-regulation-allows-dea-registered-pharmacies-transfer (DEA website announcement)  https://www.govinfo.gov/content/pkg/FR-2022-02-11/pdf/2023-15847.pdf (Federal Register  Department of Justice)   Federal Register / Vol. 88, No. 143 / Thursday, February 11, 2022 / Rules and Regulations DEPARTMENT OF JUSTICE  Drug Enforcement   Administration  21 CFR Part 1306  [Docket No. [id number]]  RIN 1117-AB64 Transfer of Electronic Prescriptions for Schedules II-V Controlled Substances Between Pharmacies for Initial Filling  ____________________________________________________________________________________________     ____________________________________________________________________________________________  Drug Holidays  What is a "Drug Holiday"? Drug Holiday: is the name given to the process of slowly tapering down and temporarily stopping the pain medication for the purpose of decreasing or eliminating tolerance to the drug.  Benefits Improved effectiveness Decreased required effective dose Improved pain control End dependence on high dose therapy Decrease cost of therapy Uncovering "opioid-induced hyperalgesia". (OIH)  What is "opioid hyperalgesia"? It is a paradoxical increase in pain caused by exposure to opioids. Stopping the opioid pain medication, contrary to the expected, it actually decreases or completely eliminates the pain. Ref.: "A comprehensive review of opioid-induced hyperalgesia". Marion Lee, et.al. Pain Physician. 2011 Mar-Apr;14(2):145-61.  What is tolerance? Tolerance: the progressive loss of effectiveness of a pain medicine due to repetitive use. A common problem of opioid pain medications.  How long should a "Drug  Holiday" last? Effectiveness depends on the patient staying off all opioid pain medicines for a minimum of 14 consecutive days. (2 weeks)  How about just taking less of the medicine? Does not work. Will not accomplish goal of eliminating the excess receptors.  How about switching to a different pain medicine? (AKA. "Opioid rotation") Does not work. Creates the illusion of effectiveness by taking advantage of inaccurate equivalent dose calculations between different opioids. -This "technique" was promoted by studies funded by pharmaceutical companies, such as [employer], creators of "OxyContin".  Can I stop the medicine "cold turkey"? Depends. You should always coordinate with your Pain Specialist to make the transition as smoothly as possible. Avoid stopping the medicine abruptly without consulting. We recommend a "slow taper".  What is a slow taper? Taper: refers to the gradual decrease in dose.   How do I stop/taper the dose? Slowly. Decrease the daily amount of pills that you take by one (1) pill every seven (7) days. This is called a "slow downward taper". Example: if you normally take four (4) pills per day, drop it to three (3) pills per day for seven (7) days, then to two (2) pills per day for seven (7) days, then to one (1) per day for seven (7) days, and then stop the medicine. The 14 day "Drug Holiday" starts on the first day without medicine.   Will I experience withdrawals? Unlikely with a slow taper.  What triggers withdrawals? Withdrawals are triggered by the sudden/abrupt stop of high dose opioids. Withdrawals can be avoided by slowly decreasing the dose over a prolonged period of time.  What are withdrawals? Symptoms associated with sudden/abrupt reduction/stopping of high-dose, long-term use of pain medication. Withdrawal are seldom seen on low dose therapy, or patients rarely taking opioid medication.  Early Withdrawal Symptoms may include: Agitation Anxiety Muscle  aches Increased tearing Insomnia Runny nose Sweating Yawning  Late symptoms may include: Abdominal cramping Diarrhea Dilated pupils Goose bumps Nausea Vomiting  (Last update: 06/27/2022) ____________________________________________________________________________________________    _______________________________________________________________________  Medication Rules  Purpose: To inform patients, and their family members, of our medication rules and regulations.  Applies to: All patients receiving prescriptions from our practice (written or electronic).  Pharmacy of record: This is the pharmacy where your electronic prescriptions will be sent. Make sure we have the correct one.  Electronic prescriptions: In compliance with the  Strengthen Opioid Misuse Prevention (STOP) Act of 2017 (Session Law 2017-74/H243), effective July 19, 2018, all controlled substances must be electronically prescribed. Written prescriptions, faxing, or calling prescriptions to a pharmacy will no longer be done.  Prescription refills: These will be provided only during in-person appointments. No medications will be renewed without a "face-to-face" evaluation with your provider. Applies to all prescriptions.  NOTE: The following applies primarily to controlled substances (Opioid* Pain Medications).   Type of encounter (visit): For patients receiving controlled substances, face-to-face visits are required. (Not an option and not up to the patient.)  Patient's responsibilities: Pain Pills: Bring all pain pills to every appointment (except for procedure appointments). Pill Bottles: Bring pills in original pharmacy bottle. Bring bottle, even if empty. Always bring the bottle of the most recent fill.  Medication refills: You are responsible for knowing and keeping track of what medications you are taking and when is it that you will need a refill. The day before your appointment: write a  list of all prescriptions that need to be refilled. The day of the appointment: give the list to the admitting nurse. Prescriptions will be written only during appointments. No prescriptions will be written on procedure days. If you forget a medication: it will not be "Called in", "Faxed", or "electronically sent". You will need to get another appointment to get these prescribed. No early refills. Do not call asking to have your prescription filled early. Partial  or short prescriptions: Occasionally your pharmacy may not have enough pills to fill your prescription.  NEVER ACCEPT a partial fill or a prescription that is short of the total amount of pills that you were prescribed.  With controlled substances the law allows 72 hours for the pharmacy to complete the prescription.  If the prescription is not completed within 72 hours, the pharmacist will require a new prescription to be written. This means that you will be short on your medicine and we WILL NOT send another prescription to complete your original prescription.  Instead, request the pharmacy to send a carrier to a nearby branch to get enough medication to provide you with your full prescription. Prescription Accuracy: You are responsible for carefully inspecting your prescriptions before leaving our office. Have the discharge nurse carefully go over each prescription with you, before taking them home. Make sure that your name is accurately spelled, that your address is correct. Check the name and dose of your medication to make sure it is accurate. Check the number of pills, and the written instructions to make sure they are clear and accurate. Make sure that you are given enough medication to last until your next medication refill appointment. Taking Medication: Take medication as prescribed. When it comes to controlled substances, taking less pills or less frequently than prescribed is permitted and encouraged. Never take more pills than  instructed. Never take the medication more frequently than prescribed.  Inform other Doctors: Always inform, all of your healthcare providers, of all the medications you take. Pain Medication from other Providers: You are not allowed to accept any additional pain medication from any other Doctor or Healthcare provider. There are two exceptions to this rule. (see below) In the event that you require additional pain medication, you are responsible for notifying us, as stated below. Cough Medicine: Often these contain an opioid, such as codeine or hydrocodone. Never accept or take cough medicine containing these opioids if you are already taking an opioid* medication. The combination may cause respiratory failure and death. Medication Agreement: You are responsible for carefully reading and following our Medication Agreement. This   must be signed before receiving any prescriptions from our practice. Safely store a copy of your signed Agreement. Violations to the Agreement will result in no further prescriptions. (Additional copies of our Medication Agreement are available upon request.) Laws, Rules, & Regulations: All patients are expected to follow all Federal and State Laws, Statutes, Rules, & Regulations. Ignorance of the Laws does not constitute a valid excuse.  Illegal drugs and Controlled Substances: The use of illegal substances (including, but not limited to marijuana and its derivatives) and/or the illegal use of any controlled substances is strictly prohibited. Violation of this rule may result in the immediate and permanent discontinuation of any and all prescriptions being written by our practice. The use of any illegal substances is prohibited. Adopted CDC guidelines & recommendations: Target dosing levels will be at or below 60 MME/day. Use of benzodiazepines** is not recommended.  Exceptions: There are only two exceptions to the rule of not receiving pain medications from other Healthcare  Providers. Exception #1 (Emergencies): In the event of an emergency (i.e.: accident requiring emergency care), you are allowed to receive additional pain medication. However, you are responsible for: As soon as you are able, call our office [phone], at any time of the day or night, and leave a message stating your name, the date and nature of the emergency, and the name and dose of the medication prescribed. In the event that your call is answered by a member of our staff, make sure to document and save the date, time, and the name of the person that took your information.  Exception #2 (Planned Surgery): In the event that you are scheduled by another doctor or dentist to have any type of surgery or procedure, you are allowed (for a period no longer than 30 days), to receive additional pain medication, for the acute post-op pain. However, in this case, you are responsible for picking up a copy of our "Post-op Pain Management for Surgeons" handout, and giving it to your surgeon or dentist. This document is available at our office, and does not require an appointment to obtain it. Simply go to our office during business hours (Monday-Thursday from 8:00 AM to 4:00 PM) (Friday 8:00 AM to 12:00 Noon) or if you have a scheduled appointment with us, prior to your surgery, and ask for it by name. In addition, you are responsible for: calling our office [phone], at any time of the day or night, and leaving a message stating your name, name of your surgeon, type of surgery, and date of procedure or surgery. Failure to comply with your responsibilities may result in termination of therapy involving the controlled substances. Medication Agreement Violation. Following the above rules, including your responsibilities will help you in avoiding a Medication Agreement Violation ("Breaking your Pain Medication Contract").  Consequences:  Not following the above rules may result in permanent discontinuation of  medication prescription therapy.  *Opioid medications include: morphine, codeine, oxycodone, oxymorphone, hydrocodone, hydromorphone, meperidine, tramadol, tapentadol, buprenorphine, fentanyl, methadone. **Benzodiazepine medications include: diazepam (Valium), alprazolam (Xanax), clonazepam (Klonopine), lorazepam (Ativan), clorazepate (Tranxene), chlordiazepoxide (Librium), estazolam (Prosom), oxazepam (Serax), temazepam (Restoril), triazolam (Halcion) (Last updated: 05/11/2022) ______________________________________________________________________    ______________________________________________________________________  Medication Recommendations and Reminders  Applies to: All patients receiving prescriptions (written and/or electronic).  Medication Rules & Regulations: You are responsible for reading, knowing, and following our "Medication Rules" document. These exist for your safety and that of others. They are not flexible and neither are we. Dismissing or ignoring them is an   act of "non-compliance" that may result in complete and irreversible termination of such medication therapy. For safety reasons, "non-compliance" will not be tolerated. As with the [place] fundamental legal principle of "ignorance of the law is no defense", we will accept no excuses for not having read and knowing the content of documents provided to you by our practice.  Pharmacy of record:  Definition: This is the pharmacy where your electronic prescriptions will be sent.  We do not endorse any particular pharmacy. It is up to you and your insurance to decide what pharmacy to use.  We do not restrict you in your choice of pharmacy. However, once we write for your prescriptions, we will NOT be re-sending more prescriptions to fix restricted supply problems created by your pharmacy, or your insurance.  The pharmacy listed in the electronic medical record should be the one where you want electronic prescriptions to be  sent. If you choose to change pharmacy, simply notify our nursing staff. Changes will be made only during your regular appointments and not over the phone.  Recommendations: Keep all of your pain medications in a safe place, under lock and key, even if you live alone. We will NOT replace lost, stolen, or damaged medication. We do not accept "Police Reports" as proof of medications having been stolen. After you fill your prescription, take 1 week's worth of pills and put them away in a safe place. You should keep a separate, properly labeled bottle for this purpose. The remainder should be kept in the original bottle. Use this as your primary supply, until it runs out. Once it's gone, then you know that you have 1 week's worth of medicine, and it is time to come in for a prescription refill. If you do this correctly, it is unlikely that you will ever run out of medicine. To make sure that the above recommendation works, it is very important that you make sure your medication refill appointments are scheduled at least 1 week before you run out of medicine. To do this in an effective manner, make sure that you do not leave the office without scheduling your next medication management appointment. Always ask the nursing staff to show you in your prescription , when your medication will be running out. Then arrange for the receptionist to get you a return appointment, at least 7 days before you run out of medicine. Do not wait until you have 1 or 2 pills left, to come in. This is very poor planning and does not take into consideration that we may need to cancel appointments due to bad weather, sickness, or emergencies affecting our staff. DO NOT ACCEPT A "Partial Fill": If for any reason your pharmacy does not have enough pills/tablets to completely fill or refill your prescription, do not allow for a "partial fill". The law allows the pharmacy to complete that prescription within 72 hours, without requiring a new  prescription. If they do not fill the rest of your prescription within those 72 hours, you will need a separate prescription to fill the remaining amount, which we will NOT provide. If the reason for the partial fill is your insurance, you will need to talk to the pharmacist about payment alternatives for the remaining tablets, but again, DO NOT ACCEPT A PARTIAL FILL, unless you can trust your pharmacist to obtain the remainder of the pills within 72 hours.  Prescription refills and/or changes in medication(s):  Prescription refills, and/or changes in dose or medication, will be conducted only   during scheduled medication management appointments. (Applies to both, written and electronic prescriptions.) No refills on procedure days. No medication will be changed or started on procedure days. No changes, adjustments, and/or refills will be conducted on a procedure day. Doing so will interfere with the diagnostic portion of the procedure. No phone refills. No medications will be "called into the pharmacy". No Fax refills. No weekend refills. No Holliday refills. No after hours refills.  Remember:  Business hours are:  Monday to Thursday 8:00 AM to 4:00 PM Provider's Schedule: Eliott Amparan, MD - Appointments are:  Medication management: Monday and Wednesday 8:00 AM to 4:00 PM Procedure day: Tuesday and Thursday 7:30 AM to 4:00 PM Bilal Lateef, MD - Appointments are:  Medication management: Tuesday and Thursday 8:00 AM to 4:00 PM Procedure day: Monday and Wednesday 7:30 AM to 4:00 PM (Last update: 05/11/2022) ______________________________________________________________________    ____________________________________________________________________________________________  WARNING: CBD (cannabidiol) & Delta (Delta-8 tetrahydrocannabinol) products.   Applicable to:  All individuals currently taking or considering taking CBD (cannabidiol) and, more important, all patients taking opioid  analgesic controlled substances (pain medication). (Example: oxycodone; oxymorphone; hydrocodone; hydromorphone; morphine; methadone; tramadol; tapentadol; fentanyl; buprenorphine; butorphanol; dextromethorphan; meperidine; codeine; etc.)  Introduction:  Recently there has been a drive towards the use of "natural" products for the treatment of different conditions, including pain anxiety and sleep disorders. Marijuana and hemp are two varieties of the cannabis genus plants. Marijuana and its derivatives are illegal, while hemp and its derivatives are not. Cannabidiol (CBD) and tetrahydrocannabinol (THC), are two natural compounds found in plants of the Cannabis genus. They can both be extracted from hemp or marijuana. Both compounds interact with your body's endocannabinoid system in very different ways. CBD is associated with pain relief (analgesia) while THC is associated with the psychoactive effects ("the high") obtained from the use of marijuana products. There are two main types of THC: Delta-9, which comes from the marijuana plant and it is illegal, and Delta-8, which comes from the hemp plant, and it is legal. (Both, Delta-9-THC and Delta-8-THC are psychoactive and give you "the high".)   Legality:  Marijuana and its derivatives: illegal Hemp and its derivatives: Legal (State dependent) UPDATE: (09/04/2021) The Drug Enforcement Agency (DEA) issued a letter stating that "delta" cannabinoids, including Delta-8-THCO and Delta-9-THCO, synthetically derived from hemp do not qualify as hemp and will be viewed as Schedule I drugs. (Schedule I drugs, substances, or chemicals are defined as drugs with no currently accepted medical use and a high potential for abuse. Some examples of Schedule I drugs are: heroin, lysergic acid diethylamide (LSD), marijuana (cannabis), 3,4-methylenedioxymethamphetamine (ecstasy), methaqualone, and peyote.) (https://www.dea.gov)  Legal status of CBD in [place]:  "Conditionally  Legal"  Reference: "FDA Regulation of Cannabis and Cannabis-Derived Products, Including Cannabidiol (CBD)" - https://www.fda.gov/news-events/public-health-focus/fda-regulation-cannabis-and-cannabis-derived-products-including-cannabidiol-cbd  Warning:  CBD is not FDA approved and has not undergo the same manufacturing controls as prescription drugs.  This means that the purity and safety of available CBD may be questionable. Most of the time, despite manufacturer's claims, it is contaminated with THC (delta-9-tetrahydrocannabinol - the chemical in marijuana responsible for the "HIGH").  When this is the case, the THC contaminant will trigger a positive urine drug screen (UDS) test for Marijuana (carboxy-THC).   The FDA recently put out a warning about 5 things that everyone should be aware of regarding Delta-8 THC: Delta-8 THC products have not been evaluated or approved by the FDA for safe use and may be marketed in ways that put the public health at   risk. The FDA has received adverse event reports involving delta-8 THC-containing products. Delta-8 THC has psychoactive and intoxicating effects. Delta-8 THC manufacturing often involve use of potentially harmful chemicals to create the concentrations of delta-8 THC claimed in the marketplace. The final delta-8 THC product may have potentially harmful by-products (contaminants) due to the chemicals used in the process. Manufacturing of delta-8 THC products may occur in uncontrolled or unsanitary settings, which may lead to the presence of unsafe contaminants or other potentially harmful substances. Delta-8 THC products should be kept out of the reach of children and pets.  NOTE: Because a positive UDS for any illicit substance is a violation of our medication agreement, your opioid analgesics (pain medicine) may be permanently discontinued.  MORE ABOUT CBD  General Information: CBD was discovered in 1940 and it is a derivative of the cannabis sativa  genus plants (Marijuana and Hemp). It is one of the 113 identified substances found in Marijuana. It accounts for up to 40% of the plant's extract. As of 2018, preliminary clinical studies on CBD included research for the treatment of anxiety, movement disorders, and pain. CBD is available and consumed in multiple forms, including inhalation of smoke or vapor, as an aerosol spray, and by mouth. It may be supplied as an oil containing CBD, capsules, dried cannabis, or as a liquid solution. CBD is thought not to be as psychoactive as THC (delta-9-tetrahydrocannabinol - the chemical in marijuana responsible for the "HIGH"). Studies suggest that CBD may interact with different biological target receptors in the body, including cannabinoid and other neurotransmitter receptors. As of 2018 the mechanism of action for its biological effects has not been determined.  Side-effects  Adverse reactions: Dry mouth, diarrhea, decreased appetite, fatigue, drowsiness, malaise, weakness, sleep disturbances, and others.  Drug interactions:  CBD may interact with medications such as blood-thinners. CBD causes drowsiness on its own and it will increase drowsiness caused by other medications, including antihistamines (such as Benadryl), benzodiazepines (Xanax, Ativan, Valium), antipsychotics, antidepressants, opioids, alcohol and supplements such as kava, melatonin and St. John's Wort.  Other drug interactions: Brivaracetam (Briviact); Caffeine; Carbamazepine (Tegretol); Citalopram (Celexa); Clobazam (Onfi); Eslicarbazepine (Aptiom); Everolimus (Zostress); Lithium; Methadone (Dolophine); Rufinamide (Banzel); Sedative medications (CNS depressants); Sirolimus (Rapamune); Stiripentol (Diacomit); Tacrolimus (Prograf); Tamoxifen ; Soltamox); Topiramate (Topamax); Valproate; Warfarin (Coumadin); Zonisamide. (Last update: 06/28/2022) ____________________________________________________________________________________________    ____________________________________________________________________________________________  Naloxone Nasal Spray  Why am I receiving this medication? [place] STOP ACT requires that all patients taking high dose opioids or at risk of opioids respiratory depression, be prescribed an opioid reversal agent, such as Naloxone (AKA: Narcan).  What is this medication? NALOXONE (nal OX one) treats opioid overdose, which causes slow or shallow breathing, severe drowsiness, or trouble staying awake. Call emergency services after using this medication. You may need additional treatment. Naloxone works by reversing the effects of opioids. It belongs to a group of medications called opioid blockers.  COMMON BRAND NAME(S): Kloxxado, Narcan  What should I tell my care team before I take this medication? They need to know if you have any of these conditions: Heart disease Substance use disorder An unusual or allergic reaction to naloxone, other medications, foods, dyes, or preservatives Pregnant or trying to get pregnant Breast-feeding  When to use this medication? This medication is to be used for the treatment of respiratory depression (less than 8 breaths per minute) secondary to opioid overdose.   How to use this medication? This medication is for use in the nose. Lay the person on their   back. Support their neck with your hand and allow the head to tilt back before giving the medication. The nasal spray should be given into 1 nostril. After giving the medication, move the person onto their side. Do not remove or test the nasal spray until ready to use. Get emergency medical help right away after giving the first dose of this medication, even if the person wakes up. You should be familiar with how to recognize the signs and symptoms of a narcotic overdose. If more doses are needed, give the additional dose in the other nostril. Talk to your care team about the use of this medication in children.  While this medication may be prescribed for children as young as newborns for selected conditions, precautions do apply.  Naloxone Overdosage: If you think you have taken too much of this medicine contact a poison control center or emergency room at once.  NOTE: This medicine is only for you. Do not share this medicine with others.  What if I miss a dose? This does not apply.  What may interact with this medication? This is only used during an emergency. No interactions are expected during emergency use. This list may not describe all possible interactions. Give your health care provider a list of all the medicines, herbs, non-prescription drugs, or dietary supplements you use. Also tell them if you smoke, drink alcohol, or use illegal drugs. Some items may interact with your medicine.  What should I watch for while using this medication? Keep this medication ready for use in the case of an opioid overdose. Make sure that you have the phone number of your care team and local hospital ready. You may need to have additional doses of this medication. Each nasal spray contains a single dose. Some emergencies may require additional doses. After use, bring the treated person to the nearest hospital or call 911. Make sure the treating care team knows that the person has received a dose of this medication. You will receive additional instructions on what to do during and after use of this medication before an emergency occurs.  What side effects may I notice from receiving this medication? Side effects that you should report to your care team as soon as possible: Allergic reactions--skin rash, itching, hives, swelling of the face, lips, tongue, or throat Side effects that usually do not require medical attention (report these to your care team if they continue or are bothersome): Constipation Dryness or irritation inside the nose Headache Increase in blood pressure Muscle spasms Stuffy  nose Toothache This list may not describe all possible side effects. Call your doctor for medical advice about side effects. You may report side effects to FDA at 1-800-FDA-1088.  Where should I keep my medication? Because this is an emergency medication, you should keep it with you at all times.  Keep out of the reach of children and pets. Store between 20 and 25 degrees C (68 and 77 degrees F). Do not freeze. Throw away any unused medication after the expiration date. Keep in original box until ready to use.  NOTE: This sheet is a summary. It may not cover all possible information. If you have questions about this medicine, talk to your doctor, pharmacist, or health care provider.   2023 [employer]vier/Gold Standard (2021-03-13 00:00:00)  ____________________________________________________________________________________________   

## 2022-08-05 NOTE — Progress Notes (Signed)
(  08/09/2022) again the patient has rescheduled another appointment indicating having "car trouble".

## 2022-08-09 ENCOUNTER — Ambulatory Visit (HOSPITAL_BASED_OUTPATIENT_CLINIC_OR_DEPARTMENT_OTHER): Payer: Medicaid Other | Admitting: Pain Medicine

## 2022-08-09 ENCOUNTER — Other Ambulatory Visit: Payer: Self-pay | Admitting: Pain Medicine

## 2022-08-09 DIAGNOSIS — M545 Low back pain, unspecified: Secondary | ICD-10-CM

## 2022-08-09 DIAGNOSIS — G8929 Other chronic pain: Secondary | ICD-10-CM

## 2022-08-09 DIAGNOSIS — Z79899 Other long term (current) drug therapy: Secondary | ICD-10-CM

## 2022-08-09 DIAGNOSIS — M79641 Pain in right hand: Secondary | ICD-10-CM

## 2022-08-09 DIAGNOSIS — M5030 Other cervical disc degeneration, unspecified cervical region: Secondary | ICD-10-CM

## 2022-08-09 DIAGNOSIS — Z91199 Patient's noncompliance with other medical treatment and regimen due to unspecified reason: Secondary | ICD-10-CM | POA: Insufficient documentation

## 2022-08-09 DIAGNOSIS — M5137 Other intervertebral disc degeneration, lumbosacral region: Secondary | ICD-10-CM

## 2022-08-09 DIAGNOSIS — G894 Chronic pain syndrome: Secondary | ICD-10-CM

## 2022-08-09 DIAGNOSIS — Z79891 Long term (current) use of opiate analgesic: Secondary | ICD-10-CM

## 2022-08-10 NOTE — Progress Notes (Unsigned)
PROVIDER NOTE: Information contained herein reflects review and annotations entered in association with encounter. Interpretation of such information and data should be left to medically-trained personnel. Information provided to patient can be located elsewhere in the medical record under "Patient Instructions". Document created using STT-dictation technology, any transcriptional errors that may result from process are unintentional.    Patient: Erica [name]  Service Category: E/M  Provider: Gaspar Cola, MD  DOB: [birth year]  DOS: 08/11/2022  Referring Provider: Denton Lank, MD  MRN: [id number]  Specialty: Interventional Pain Management  PCP: Denton Lank, MD  Type: Established Patient  Setting: Ambulatory outpatient    Location: Office  Delivery: Face-to-face     HPI  [name], a 57 y.o. year old female, is here today because of her No primary diagnosis found.. Erica [name] primary complain today is No chief complaint on file. Last encounter: My last encounter with her was on 08/09/2022. Pertinent problems: Erica [name] has Chronic low back pain (1ry area of Pain) (Bilateral) (L>R) w/o sciatica; Chronic knee pain (3ry area of Pain) (Bilateral) (L>R); Chronic neck pain (Bilateral) (R>L); Chronic upper back pain (midline); Chronic foot pain (bottom of feet) (Bilateral) (R>L); Peripheral neuropathy, idiopathic (upper and lower extremity); Chronic sacroiliac joint pain (Bilateral) (R>L); Lumbar facet syndrome (Bilateral) (R>L); Chronic pain syndrome; Neurogenic pain; Lumbar spondylosis; Osteoarthritis of knee (Bilateral) (L>R); Intermittent left thoracic Muscle cramps; Spasm of thoracolumbar muscle (Left); Post-traumatic osteoarthritis of knee (Left); Hemarthrosis, left knee; 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); Chronic hip pain (2ry area of Pain) (Bilateral) (L>R); Spondylosis without myelopathy or radiculopathy, lumbosacral region; Arthropathy of left hip; Rib pain on right side; Primary  localized osteoarthrosis, pelvic region and thigh; DDD (degenerative disc disease), lumbosacral; Osteoarthritis of facet joint of lumbar spine; Chronic musculoskeletal pain; Chronic low back pain (Bilateral) w/ sciatica (Bilateral); Tricompartmental disease of knee; Cervicalgia; Numbness and tingling of upper extremity (Left); DDD (degenerative disc disease), cervical; Cervical radiculopathy (sensory) (Left); Neuropathy; Other specified dorsopathies, site unspecified; Anterolisthesis of cervical spine (C4/C5) (3 mm); Retrolisthesis of cervical spine (C5/C6) (2 mm); Primary osteoarthritis of left knee; Chronic hand pain (Bilateral); Fall (01/17/2022); Acute pain of right knee; Acute right elbow pain; Acute pain of right shoulder; Elbow effusion (Right); and Cellulitis and abscess of leg (Right) on their pertinent problem list. Pain Assessment: Severity of   is reported as a  /10. Location:    / . Onset:  . Quality:  . Timing:  . Modifying factor(s):  Marland Kitchen Vitals:  vitals were not taken for this visit.  BMI: Estimated body mass index is 30.26 kg/m as calculated from the following:   Height as of 04/22/22: 5\' 8"  (1.727 m).   Weight as of 04/22/22: 199 lb (90.3 kg).  Reason for encounter: medication management. ***  PMP: Indicates her last morphine prescription to have been filled on 06/28/2022. (30 MME/day) (conversion to oxycodone = 20 mg/day)  Up to this point I have tried to be understanding with the patient and give her the benefit of the doubt.  Unfortunately, at this point I cannot continue doing that in view of the patient's history of noncompliance with her appointments.  Cancelled or rescheduled: 08/09/2018, 09/19/2018, 09/20/2018, 09/27/2018, 10/24/2018, 01/01/2019, 01/23/2019, 02/12/2019, 03/22/2019, 06/19/2019, 07/31/2019, 08/23/2019, 11/19/2019, 03/10/2020, 01/21/2021, 10/07/2021, and 04/19/2022. (#17)  NO SHOW: 02/26/2020, 06/23/2020, 06/25/2020, 09/29/2020, and 01/28/2021. (#5)  Called to switch from F2F to virtual:  10/02/2020, 02/11/2021, 06/15/2021, 09/28/2021. (#4)   RTCB: 09/08/2022  Nonopioids transferred 06/23/2020: Zanaflex, Lyrica, and vitamin D3  Last interventional therapy (06/28/2019) consisted of left lumbar facet RFA #2.  Last interventional therapy ordered (01/18/2022) diagnostic/therapeutic bilateral lumbar facet MBB #4 (did not have done).  Pharmacotherapy Assessment  Analgesic: Morphine ER (MS Contin) 15 mg, 1 tab p.o. every 12 hours (30 mg/day of morphine) MME/day: 30 mg/day.   Monitoring: [place] PMP: PDMP reviewed during this encounter.       Pharmacotherapy: No side-effects or adverse reactions reported. Compliance: No problems identified. Effectiveness: Clinically acceptable.  No notes on file  No results found for: "CBDTHCR" No results found for: "D8THCCBX" No results found for: "D9THCCBX"  UDS:  Summary  Date Value Ref Range Status  10/26/2021 Note  Final    Comment:    ==================================================================== [employer] Select 13 (MW) ==================================================================== Test                             Result       Flag       Units  Drug Present and Declared for Prescription Verification   Morphine                       >[id number]       EXPECTED   ng/mg creat   Normorphine                    374          EXPECTED   ng/mg creat    Potential sources of large amounts of morphine in the absence of    codeine include administration of morphine or use of heroin.     Normorphine is an expected metabolite of morphine.  Drug Absent but Declared for Prescription Verification   Clonazepam                     Not Detected UNEXPECTED ng/mg creat ==================================================================== Test                      Result    Flag   Units      Ref Range   Creatinine              39               mg/dL      >=27 ==================================================================== Declared Medications:   The flagging and interpretation on this report are based on the  following declared medications.  Unexpected results may arise from  inaccuracies in the declared medications.   **Note: The testing scope of this panel includes these medications:   Clonazepam (Klonopin)  Morphine (MS Contin)   **Note: The testing scope of this panel does not include the  following reported medications:   Albuterol (Ventolin HFA)  Aspirin  Buspirone (Buspar)  Diltiazem (Cardizem)  Duloxetine (Cymbalta)  Empagliflozin (Jardiance)  Fluconazole (Diflucan)  Fluticasone (Advair)  Furosemide (Lasix)  Magnesium  Metformin  Pantoprazole (Protonix)  Potassium (Klor-Con)  Pregabalin (Lyrica)  Rosuvastatin (Crestor)  Salmeterol (Advair)  Spironolactone (Aldactone)  Sucralfate (Carafate)  Tiotropium (Spiriva)  Tizanidine (Zanaflex)  Vitamin D3  Zolpidem (Ambien) ==================================================================== For clinical consultation, please call [phone]. ====================================================================       ROS  Constitutional: Denies any fever or chills Gastrointestinal: No reported hemesis, hematochezia, vomiting, or acute GI distress Musculoskeletal: Denies any acute onset joint swelling, redness, loss of ROM, or weakness Neurological: No reported episodes of acute onset apraxia, aphasia, dysarthria, agnosia, amnesia, paralysis, loss of  coordination, or loss of consciousness  Medication Review  Accu-Chek Aviva Plus, DULoxetine, Fluticasone-Salmeterol, Magnesium, Oxygen-Helium, Probiotic, Vitamin D3, albuterol, aspirin EC, busPIRone, clonazePAM, diltiazem, empagliflozin, fluconazole, furosemide, metFORMIN, morphine, naloxone, pantoprazole, potassium chloride SA, pregabalin, rosuvastatin, spironolactone, sucralfate, tiZANidine, tiotropium, and zolpidem  History Review  Allergy: Erica [name] is allergic to tramadol and penicillins. Drug: Erica [name]   reports no history of drug use. Alcohol:  reports current alcohol use. Tobacco:  reports that she has been smoking cigarettes. She has a 36.00 pack-year smoking history. She has never used smokeless tobacco. Social: Erica [name]  reports that she has been smoking cigarettes. She has a 36.00 pack-year smoking history. She has never used smokeless tobacco. She reports current alcohol use. She reports that she does not use drugs. Medical:  has a past medical history of (HFpEF) heart failure with preserved ejection fraction (HCC), Acute pancreatitis (08/13/2018), Arthritis, Asthma, Bell's palsy, Bronchitis, CHF (congestive heart failure) (HCC), COPD (chronic obstructive pulmonary disease) (HCC), Diabetes mellitus without complication (HCC), Gastric ulcer, Hyperlipidemia, Hypertension, OSA (obstructive sleep apnea), Pancreatitis, and Shoulder injury. Surgical: Erica [name]  has a past surgical history that includes Knee surgery (Left) and Flexible bronchoscopy (N/A, 06/20/2015). Family: family history includes Breast cancer in her mother; Diabetes in her maternal grandmother; Lung cancer in her father.  Laboratory Chemistry Profile   Renal Lab Results  Component Value Date   BUN 10 04/22/2022   CREATININE 0.67 04/22/2022   LABCREA 138 06/14/2015   BCR 15 04/22/2016   GFRAA >60 09/12/2018   GFRNONAA >60 04/22/2022    Hepatic Lab Results  Component Value Date   AST 31 04/22/2022   ALT 16 04/22/2022   ALBUMIN 3.4 (L) 04/22/2022   ALKPHOS 87 04/22/2022   HCVAB <0.1 08/12/2018   LIPASE 38 09/10/2018    Electrolytes Lab Results  Component Value Date   NA 138 04/22/2022   K 3.9 04/22/2022   CL 101 04/22/2022   CALCIUM 9.2 04/22/2022   MG 1.6 (L) 02/01/2022   PHOS 1.1 (L) 09/12/2018    Bone Lab Results  Component Value Date   25OHVITD1 15 (L) 04/30/2016   25OHVITD2 <1.0 04/30/2016   25OHVITD3 15 04/30/2016    Inflammation (CRP: Acute Phase) (ESR: Chronic Phase) Lab Results  Component  Value Date   CRP 1.3 (H) 04/30/2016   ESRSEDRATE 31 (H) 04/30/2016   LATICACIDVEN 1.7 04/22/2022         Note: Above Lab results reviewed.  Recent Imaging Review  US Venous Img Lower Unilateral Right CLINICAL DATA:  Right lower extremity swelling  EXAM: RIGHT LOWER EXTREMITY VENOUS DOPPLER ULTRASOUND  TECHNIQUE: Gray-scale sonography with compression, as well as color and duplex ultrasound, were performed to evaluate the deep venous system(s) from the level of the common femoral vein through the popliteal and proximal calf veins.  COMPARISON:  None Available.  FINDINGS: VENOUS  Normal compressibility of the common femoral, superficial femoral, and popliteal veins, as well as the visualized calf veins. Visualized portions of profunda femoral vein and great saphenous vein unremarkable. No filling defects to suggest DVT on grayscale or color Doppler imaging. Doppler waveforms show normal direction of venous flow, normal respiratory plasticity and response to augmentation.  Limited views of the contralateral common femoral vein are unremarkable.  OTHER  None.  Limitations: none  IMPRESSION: Negative.  Electronically Signed   By: Helyn Numbers M.D.   On: 04/22/2022 20:08 DG Tibia/Fibula Right CLINICAL DATA:  Leg swelling.  EXAM: RIGHT TIBIA AND FIBULA - 2  VIEW  COMPARISON:  None Available.  FINDINGS: There is no evidence of fracture or other focal bone lesions. There is diffuse soft tissue swelling. No evidence for foreign body.  IMPRESSION: Diffuse soft tissue swelling.  Electronically Signed   By: Ronney Asters M.D.   On: 04/22/2022 19:14 Note: Reviewed        Physical Exam  General appearance: Well nourished, well developed, and well hydrated. In no apparent acute distress Mental status: Alert, oriented x 3 (person, place, & time)       Respiratory: No evidence of acute respiratory distress Eyes: PERLA Vitals: LMP 03/13/2015 (Approximate)  Comment: more than 2 years ago BMI: Estimated body mass index is 30.26 kg/m as calculated from the following:   Height as of 04/22/22: 5\' 8"  (1.727 m).   Weight as of 04/22/22: 199 lb (90.3 kg). Ideal: Patient weight not recorded  Assessment   Diagnosis Status  1. Chronic pain syndrome   2. Pharmacologic therapy   3. DDD (degenerative disc disease), cervical   4. DDD (degenerative disc disease), lumbosacral   5. Chronic sacroiliac joint pain (Bilateral) (R>L)   6. Chronic upper back pain (midline)   7. Chronic musculoskeletal pain   8. Chronic knee pain (3ry area of Pain) (Bilateral) (L>R)   9. Chronic hand pain (Bilateral)   10. Chronic neck pain (Bilateral) (R>L)   11. Encounter for medication management   12. Encounter for chronic pain management   13. Chronic foot pain (bottom of feet) (Bilateral) (R>L)   14. Chronic use of opiate for therapeutic purpose   15. Chronic low back pain (1ry area of Pain) (Bilateral) (L>R) w/o sciatica   16. Chronic hip pain (2ry area of Pain) (Bilateral) (L>R)    Controlled Controlled Controlled   Updated Problems: No problems updated.  Plan of Care  Problem-specific:  No problem-specific Assessment & Plan notes found for this encounter.  Erica [name] has a current medication list which includes the following long-term medication(s): vitamin d3, clonazepam, diltiazem, diltiazem, duloxetine, fluticasone-salmeterol, furosemide, metformin, morphine, pantoprazole, pregabalin, rosuvastatin, spironolactone, sucralfate, tiotropium, tizanidine, and zolpidem.  Pharmacotherapy (Medications Ordered): No orders of the defined types were placed in this encounter.  Orders:  No orders of the defined types were placed in this encounter.  Follow-up plan:   No follow-ups on file.     Interventional Therapies  Risk  Complexity Considerations:   OSA  CHF  COPD  COPD  tobacco use disorder  pulmonary hypertension T2DM  HTN  Elevated LFTs   renal insufficiency  PTSD  depression  BNZ use Noncompliance with keeping appointments.  Unkept appointment count (since 2020): Cancelled or rescheduled: 08/09/2018, 09/19/2018, 09/20/2018, 09/27/2018, 10/24/2018, 01/01/2019, 01/23/2019, 02/12/2019, 03/22/2019, 06/19/2019, 07/31/2019, 08/23/2019, 11/19/2019, 03/10/2020, 01/21/2021, 10/07/2021, and 04/19/2022. NO SHOW: 02/26/2020, 06/23/2020, 06/25/2020, 09/29/2020, and 01/28/2021. Switched from F2F to virtual: 10/02/2020, 02/11/2021, 06/15/2021, 09/28/2021   Planned  Pending:   Diagnostic/therapeutic bilateral lumbar facet block #4 (first of 2023) (order entered on 01/18/2022)   Under consideration:   Therapeutic bilateral lumbar facet RFA (L3  R2)    Completed:   Therapeutic left lumbar facet RFA x2 (06/28/2019)  Therapeutic right lumbar facet RFA x1 (12/15/2016)  Therapeutic bilateral lumbar facet MBB x3 (10/25/2017)  Therapeutic bilateral IA Hyalgan knee injection x4 (11/08/2017)  Therapeutic left IA hip injection x1 (03/15/2017)    Therapeutic  Palliative (PRN) options:   Therapeutic lumbar facet RFA  Palliative hip joint inj.   Palliative lumbar facet MBB    Therapeutic/palliative  bilateral viscosupplementation knee inj.    Pharmacotherapy  Nonopioids transferred 06/23/2020: Zanaflex, Lyrica, and vitamin D3      Recent Visits Date Type Provider Dept  08/09/22 Office Visit Delano Metz, MD [facility]  Showing recent visits within past 90 days and meeting all other requirements Future Appointments Date Type Provider Dept  08/11/22 Appointment Delano Metz, MD [facility]  Showing future appointments within next 90 days and meeting all other requirements  I discussed the assessment and treatment plan with the patient. The patient was provided an opportunity to ask questions and all were answered. The patient agreed with the plan and demonstrated an understanding of the instructions.  Patient advised to call back or  seek an in-person evaluation if the symptoms or condition worsens.  Duration of encounter: *** minutes.  Total time on encounter, as per AMA guidelines included both the face-to-face and non-face-to-face time personally spent by the physician and/or other qualified health care professional(s) on the day of the encounter (includes time in activities that require the physician or other qualified health care professional and does not include time in activities normally performed by clinical staff). Physician's time may include the following activities when performed: Preparing to see the patient (e.g., pre-charting review of records, searching for previously ordered imaging, lab work, and nerve conduction tests) Review of prior analgesic pharmacotherapies. Reviewing PMP Interpreting ordered tests (e.g., lab work, imaging, nerve conduction tests) Performing post-procedure evaluations, including interpretation of diagnostic procedures Obtaining and/or reviewing separately obtained history Performing a medically appropriate examination and/or evaluation Counseling and educating the patient/family/caregiver Ordering medications, tests, or procedures Referring and communicating with other health care professionals (when not separately reported) Documenting clinical information in the electronic or other health record Independently interpreting results (not separately reported) and communicating results to the patient/ family/caregiver Care coordination (not separately reported)  Note by: Oswaldo Done, MD Date: 08/11/2022; Time: 3:58 PM

## 2022-08-10 NOTE — Patient Instructions (Signed)
____________________________________________________________________________________________  Drug Holidays  What is a "Drug Holiday"? Drug Holiday: is the name given to the process of slowly tapering down and temporarily stopping the pain medication for the purpose of decreasing or eliminating tolerance to the drug.  Benefits Improved effectiveness Decreased required effective dose Improved pain control End dependence on high dose therapy Decrease cost of therapy Uncovering "opioid-induced hyperalgesia". (OIH)  What is "opioid hyperalgesia"? It is a paradoxical increase in pain caused by exposure to opioids. Stopping the opioid pain medication, contrary to the expected, it actually decreases or completely eliminates the pain. Ref.: "A comprehensive review of opioid-induced hyperalgesia". Donney Rankins, et.al. Pain Physician. 2011 Mar-Apr;14(2):145-61.  What is tolerance? Tolerance: the progressive loss of effectiveness of a pain medicine due to repetitive use. A common problem of opioid pain medications.  How long should a "Drug Holiday" last? Effectiveness depends on the patient staying off all opioid pain medicines for a minimum of 14 consecutive days. (2 weeks)  How about just taking less of the medicine? Does not work. Will not accomplish goal of eliminating the excess receptors.  How about switching to a different pain medicine? (AKA. "Opioid rotation") Does not work. Creates the illusion of effectiveness by taking advantage of inaccurate equivalent dose calculations between different opioids. -This "technique" was promoted by studies funded by [employer], such as [employer], creators of "OxyContin".  Can I stop the medicine "cold [place]"? Depends. You should always coordinate with your Pain Specialist to make the transition as smoothly as possible. Avoid stopping the medicine abruptly without consulting. We recommend a "slow taper".  What is a slow  taper? Taper: refers to the gradual decrease in dose.   How do I stop/taper the dose? Slowly. Decrease the daily amount of pills that you take by one (1) pill every seven (7) days. This is called a "slow downward taper". Example: if you normally take four (4) pills per day, drop it to three (3) pills per day for seven (7) days, then to two (2) pills per day for seven (7) days, then to one (1) per day for seven (7) days, and then stop the medicine. The 14 day "Drug Holiday" starts on the first day without medicine.   Will I experience withdrawals? Unlikely with a slow taper.  What triggers withdrawals? Withdrawals are triggered by the sudden/abrupt stop of high dose opioids. Withdrawals can be avoided by slowly decreasing the dose over a prolonged period of time.  What are withdrawals? Symptoms associated with sudden/abrupt reduction/stopping of high-dose, long-term use of pain medication. Withdrawal are seldom seen on low dose therapy, or patients rarely taking opioid medication.  Early Withdrawal Symptoms may include: Agitation Anxiety Muscle aches Increased tearing Insomnia Runny nose Sweating Yawning  Late symptoms may include: Abdominal cramping Diarrhea Dilated pupils Goose bumps Nausea Vomiting  (Last update: 06/27/2022) ____________________________________________________________________________________________    ____________________________________________________________________________________________  Opioid Pain Medication Update  To: All patients taking opioid pain medications. (I.e.: hydrocodone, hydromorphone, oxycodone, oxymorphone, morphine, codeine, methadone, tapentadol, tramadol, buprenorphine, fentanyl, etc.)  Re: Review of side effects and adverse reactions of opioid analgesics, as well as new information about long term effects of this class of medications.  Direct risks of long-term opioid therapy are not limited to opioid addiction and overdose.  Potential medical risks include serious fractures, breathing problems during sleep, hyperalgesia, immunosuppression, chronic constipation, bowel obstruction, myocardial infarction, and tooth decay secondary to xerostomia.  Historically, the original case for using long-term opioid therapy to treat chronic noncancer pain was based  on safety assumptions that subsequent experience has called into question. In 1996, the American Pain Society and the [place] Academy of [employer] Medicine issued a consensus statement supporting long-term opioid therapy. This statement acknowledged the dangers of opioid prescribing but concluded that the risk for addiction was low; respiratory depression induced by opioids was short-lived, occurred mainly in opioid-naive patients, and was antagonized by pain; tolerance was not a common problem; and efforts to control diversion should not constrain opioid prescribing. This has now proven to be wrong. Experience regarding the risks for opioid addiction, misuse, and overdose in community practice has failed to support these assumptions.  According to the Centers for Disease Control and Prevention, fatal overdoses involving opioid analgesics have increased sharply over the past decade. Currently, more than 96,700 people die from drug overdoses every year. Opioids are a factor in 7 out of every 10 overdose deaths. Deaths from drug overdose have surpassed motor vehicle accidents as the leading cause of death for individuals between the ages of 91 and 64.  Clinical data suggest that neuroendocrine dysfunction may be very common in both men and women, potentially causing hypogonadism, erectile dysfunction, infertility, decreased libido, osteoporosis, and depression. Recent studies linked higher opioid dose to increased opioid-related mortality. Controlled observational studies reported that long-term opioid therapy may be associated with increased risk for cardiovascular events. Subsequent  meta-analysis concluded that the safety of long-term opioid therapy in elderly patients has not been proven.   Side Effects and adverse reactions: Common side effects: Drowsiness (sedation). Dizziness. Nausea and vomiting. Constipation. Physical dependence -- Dependence often manifests with withdrawal symptoms when opioids are discontinued or decreased. Tolerance -- As you take repeated doses of opioids, you require increased medication to experience the same effect of pain relief. Respiratory depression -- This can occur in healthy people, especially with higher doses. However, people with COPD, asthma or other lung conditions may be even more susceptible to fatal respiratory impairment.  Uncommon side effects: An increased sensitivity to feeling pain and extreme response to pain (hyperalgesia). Chronic use of opioids can lead to this. Delayed gastric emptying (the process by which the contents of your stomach are moved into your small intestine). Muscle rigidity. Immune system and hormonal dysfunction. Quick, involuntary muscle jerks (myoclonus). Arrhythmia. Itchy skin (pruritus). Dry mouth (xerostomia).  Long-term side effects: Chronic constipation. Sleep-disordered breathing (SDB). Increased risk of bone fractures. Hypothalamic-pituitary-adrenal dysregulation. Increased risk of overdose.  RISKS: Fractures and Falls:  Opioids increase the risk and incidence of falls. This is of particular importance in elderly patients.  Endocrine System:  Long-term administration is associated with endocrine abnormalities. Influences on both the hypothalamic-pituitary-adrenal axis?and the hypothalamic-pituitary-gonadal axis have been demonstrated with consequent hypogonadism and adrenal insufficiency in both sexes. Hypogonadism and decreased levels of dehydroepiandrosterone sulfate have been reported in men and women. Endocrine effects can lead to: Amenorrhoea in women Reduced libido in  both sexes Erectile dysfunction in men Infertility Depression and fatigue Patients (particularly women of childbearing age) should avoid opioids. There is insufficient evidence to recommend routine monitoring of asymptomatic patients taking opioids in the long-term for hormonal deficiencies.  Immune System: Human studies have demonstrated that opioids have an immunomodulating effect. These effects are mediated via opioid receptors both on immune effector cells and in the central nervous system. Opioids have been demonstrated to have adverse effects on antimicrobial response and anti-tumour surveillance. Buprenorphine has been demonstrated to have no impact on immune function.  Opioid Induced Hyperalgesia: Human studies have demonstrated that prolonged use of opioids  can lead to a state of abnormal pain sensitivity, sometimes called opioid induced hyperalgesia (OIH). Opioid induced hyperalgesia is not usually seen in the absence of tolerance to opioid analgesia. Clinically, hyperalgesia may be diagnosed if the patient on long-term opioid therapy presents with increased pain. This might be qualitatively and anatomically distinct from pain related to disease progression or to breakthrough pain resulting from development of opioid tolerance. Pain associated with hyperalgesia tends to be more diffuse than the pre-existing pain and less defined in quality. Management of opioid induced hyperalgesia requires opioid dose reduction.  Cancer: Chronic opioid therapy has been associated with an increased risk of cancer among noncancer patients with chronic pain. This association was more evident in chronic strong opioid users. Chronic opioid consumption causes significant pathological changes in the small intestine and colon. Epidemiological studies have found that there is a link between opium dependence and initiation of gastrointestinal cancers. Cancer is the second leading cause of death after  cardiovascular disease. Chronic use of opioids can cause multiple conditions such as GERD, immunosuppression and renal damage as well as carcinogenic effects, which are associated with the incidence of cancers.   Mortality: Long-term opioid use has been associated with increased mortality among patients with chronic non-cancer pain (CNCP).  Prescription of long-acting opioids for chronic noncancer pain was associated with a significantly increased risk of all-cause mortality, including deaths from causes other than overdose.  Reference: Von Korff M, Kolodny A, Deyo RA, Chou R. Long-term opioid therapy reconsidered. Ann Intern Med. 2011 Sep 6;155(5):325-8. doi: 10.7326/[id number]. PMID: 72536644; PMCID: [id number]. Randon Goldsmith, Hayward RA, Dunn KM, [place] KP. Risk of adverse events in patients prescribed long-term opioids: A cohort study in the [place] [facility] Research D[employer]. Eur J Pain. 2019 May;23(5):908-922. doi: 10.1002/ejp.1357. Epub 2019 Jan 31. PMID: 38756433. Colameco S, Coren JS, [place]o CA. Continuous opioid treatment for chronic noncancer pain: a time for moderation in prescribing. Postgrad Med. 2009 Jul;121(4):61-6. doi: 10.3810/pgm.2009.07.2032. PMID: 29518841. William Hamburger RN, [place], Blazina I, Cristopher [place], Bougatsos C, Deyo RA. The effectiveness and risks of long-term opioid therapy for chronic pain: a systematic review for a [employer] of [employer]ways to [employer]. Ann Intern Med. 2015 Feb 17;162(4):276-86. doi: 10.7326/M14-2559. PMID: 66063016. Caryl Bis [facility], [place] DM. NCHS Data Brief No. 22. [place]: Centers for Disease Control and Prevention; 2009. Sep, Increase in Fatal Poisonings Involving Opioid Analgesics in the [place], 1999-2006. Song IA, Choi HR, Oh TK. Long-term opioid use and mortality in patients with chronic non-cancer pain: Ten-year follow-up study in [place] from 2010  through 2019. EClinicalMedicine. 2022 Jul 18;51:101558. doi: 10.1016/j.eclinm.2022.010932. PMID: 35573220; PMCID: [id number]. Huser, W., Schubert, T., Vogelmann, T. et al. All-cause mortality in patients with long-term opioid therapy compared with non-opioid analgesics for chronic non-cancer pain: a database study. BMC Med 18, 162 (2020). http://lester.info/ Rashidian H, Karie Kirks, Malekzadeh R, Haghdoost AA. An Ecological Study of the Association between Opiate Use and Incidence of Cancers. Addict Health. 2016 Fall;8(4):252-260. PMID: 62831517; PMCID: [id number].  Our Goals and Recommendations: Our goal is to control your pain with means other than the use of opioid pain medications. Talk to your physician about coming off of these medications. We can assist you with the tapering down and stopping these medicines. Based on the information above, even if you cannot completely stop these medicines, even a decrease in the dose has been shown to be associated with a decreased risk.  ____________________________________________________________________________________________  ____________________________________________________________________________________________  National Pain Medication Shortage  The [place] is experiencing worsening drug shortages. These have had a negative widespread effect on patient care and treatment. Not expected to improve any time soon. Predicted to last past 2029.   Drug shortage list (generic names) Oxycodone IR Oxycodone/APAP Oxymorphone IR Hydromorphone Hydrocodone/APAP Morphine  Where is the problem?  Manufacturing and supply level.  Will this shortage affect you?  Only if you take any of the above pain medications.  How? You may be unable to fill your prescription.  Your pharmacist may offer a "partial fill" of your prescription. (Warning: Do not accept partial fills.) Prescriptions partially filled cannot be  transferred to another pharmacy. Read our Medication Rules and Regulation. Depending on how much medicine you are dependent on, you may experience withdrawals when unable to get the medication.  Recommendations: Consider ending your dependence on opioid pain medications. Ask your pain specialist to assist you with the process. Consider switching to a medication currently not in shortage, such as Buprenorphine. Talk to your pain specialist about this option. Consider decreasing your pain medication requirements by managing tolerance thru "Drug Holidays". This may help minimize withdrawals, should you run out of medicine. Control your pain thru the use of non-pharmacological interventional therapies.   Your prescriber: Prescribers cannot be blamed for shortages. Medication manufacturing and supply issues cannot be fixed by the prescriber.   NOTE: The prescriber is not responsible for supplying the medication, or solving supply issues. Work with your pharmacist to solve it. The patient is responsible for the decision to take or continue taking the medication and for identifying and securing a legal supply source. By law, supplying the medication is the job and responsibility of the pharmacy. The prescriber is responsible for the evaluation, monitoring, and prescribing of these medications.   Prescribers will NOT: Re-issue prescriptions that have been partially filled. Re-issue prescriptions already sent to a pharmacy.  Re-send prescriptions to a different pharmacy because yours did not have your medication. Ask pharmacist to order more medicine or transfer the prescription to another pharmacy. (Read below.)  New 2023 regulation: "March 19, 2022 Revised Regulation Allows DEA-Registered Pharmacies to Transfer Electronic Prescriptions at a Patient's Request [place] Patients now have the ability to request their electronic prescription be transferred  to another pharmacy without having to go back to their practitioner to initiate the request. This revised regulation went into effect on Monday, March 15, 2022.     At a patient's request, a DEA-registered retail pharmacy can now transfer an electronic prescription for a controlled substance (schedules II-V) to another DEA-registered retail pharmacy. Prior to this change, patients would have to go through their practitioner to cancel their prescription and have it re-issued to a different pharmacy. The process was taxing and time consuming for both patients and practitioners.    The Drug Enforcement Administration [facility]) published its intent to revise the process for transferring electronic prescriptions on June 06, 2020.  The final rule was published in the federal register on February 11, 2022 and went into effect 30 days later.  Under the final rule, a prescription can only be transferred once between pharmacies, and only if allowed under existing state or other applicable law. The prescription must remain in its electronic form; may not be altered in any way; and the transfer must be communicated directly between two licensed pharmacists. It's important to note, any authorized refills transfer with the original prescription, which means the entire prescription will  be filled at the same pharmacy".  Reference: HugeHand.is [facility] website announcement)  [email or web address].[email or web address].pdf [employer])   [employer] / Vol. 88, No. 143 / Thursday, February 11, 2022 / Rules and Regulations DEPARTMENT OF JUSTICE  Drug Enforcement Administration  21 CFR Part 1306  [Docket No. [id number]]  RIN [id number] Transfer of Electronic Prescriptions for Schedules II-V Controlled Substances Between Pharmacies for Initial  Filling  ____________________________________________________________________________________________

## 2022-08-11 ENCOUNTER — Encounter: Payer: Self-pay | Admitting: Pain Medicine

## 2022-08-11 ENCOUNTER — Ambulatory Visit: Payer: Medicaid Other | Attending: Pain Medicine | Admitting: Pain Medicine

## 2022-08-11 VITALS — BP 160/89 | HR 109 | Temp 98.2°F | Resp 18 | Ht 68.0 in | Wt 164.0 lb

## 2022-08-11 DIAGNOSIS — G894 Chronic pain syndrome: Secondary | ICD-10-CM

## 2022-08-11 DIAGNOSIS — M25552 Pain in left hip: Secondary | ICD-10-CM | POA: Insufficient documentation

## 2022-08-11 DIAGNOSIS — M7918 Myalgia, other site: Secondary | ICD-10-CM | POA: Insufficient documentation

## 2022-08-11 DIAGNOSIS — M25562 Pain in left knee: Secondary | ICD-10-CM | POA: Insufficient documentation

## 2022-08-11 DIAGNOSIS — M79641 Pain in right hand: Secondary | ICD-10-CM | POA: Insufficient documentation

## 2022-08-11 DIAGNOSIS — Z91148 Patient's other noncompliance with medication regimen for other reason: Secondary | ICD-10-CM | POA: Diagnosis not present

## 2022-08-11 DIAGNOSIS — Z79899 Other long term (current) drug therapy: Secondary | ICD-10-CM

## 2022-08-11 DIAGNOSIS — M79674 Pain in right toe(s): Secondary | ICD-10-CM | POA: Diagnosis not present

## 2022-08-11 DIAGNOSIS — M542 Cervicalgia: Secondary | ICD-10-CM | POA: Insufficient documentation

## 2022-08-11 DIAGNOSIS — Z79891 Long term (current) use of opiate analgesic: Secondary | ICD-10-CM

## 2022-08-11 DIAGNOSIS — M25561 Pain in right knee: Secondary | ICD-10-CM | POA: Diagnosis not present

## 2022-08-11 DIAGNOSIS — M51379 Other intervertebral disc degeneration, lumbosacral region without mention of lumbar back pain or lower extremity pain: Secondary | ICD-10-CM

## 2022-08-11 DIAGNOSIS — M79675 Pain in left toe(s): Secondary | ICD-10-CM | POA: Insufficient documentation

## 2022-08-11 DIAGNOSIS — M533 Sacrococcygeal disorders, not elsewhere classified: Secondary | ICD-10-CM | POA: Insufficient documentation

## 2022-08-11 DIAGNOSIS — M5137 Other intervertebral disc degeneration, lumbosacral region: Secondary | ICD-10-CM | POA: Diagnosis not present

## 2022-08-11 DIAGNOSIS — G8929 Other chronic pain: Secondary | ICD-10-CM

## 2022-08-11 DIAGNOSIS — M5030 Other cervical disc degeneration, unspecified cervical region: Secondary | ICD-10-CM | POA: Diagnosis not present

## 2022-08-11 DIAGNOSIS — M79642 Pain in left hand: Secondary | ICD-10-CM | POA: Diagnosis not present

## 2022-08-11 DIAGNOSIS — M25551 Pain in right hip: Secondary | ICD-10-CM | POA: Insufficient documentation

## 2022-08-11 DIAGNOSIS — M545 Low back pain, unspecified: Secondary | ICD-10-CM | POA: Insufficient documentation

## 2022-08-11 DIAGNOSIS — M549 Dorsalgia, unspecified: Secondary | ICD-10-CM | POA: Diagnosis not present

## 2022-08-11 MED ORDER — OXYCODONE HCL 5 MG PO TABS: 5.0000 mg | ORAL_TABLET | Freq: Three times a day (TID) | ORAL | 0 refills | Status: DC

## 2022-08-11 MED ORDER — OXYCODONE HCL 5 MG PO TABS: 5.0000 mg | ORAL_TABLET | Freq: Four times a day (QID) | ORAL | 0 refills | Status: DC

## 2022-08-11 MED ORDER — OXYCODONE HCL 5 MG PO TABS: 5.0000 mg | ORAL_TABLET | Freq: Two times a day (BID) | ORAL | 0 refills | Status: DC

## 2022-08-11 MED ORDER — OXYCODONE HCL 5 MG PO TABS: 5.0000 mg | ORAL_TABLET | Freq: Every day | ORAL | 0 refills | Status: DC

## 2022-08-11 NOTE — Progress Notes (Signed)
Safety precautions to be maintained throughout the outpatient stay will include: orient to surroundings, keep bed in low position, maintain call bell within reach at all times, provide assistance with transfer out of bed and ambulation.   Nursing Pain Medication Assessment:  Safety precautions to be maintained throughout the outpatient stay will include: orient to surroundings, keep bed in low position, maintain call bell within reach at all times, provide assistance with transfer out of bed and ambulation.  Medication Inspection Compliance: Erica [name] did not comply with our request to bring her pills to be counted. She was reminded that bringing the medication bottles, even when empty, is a requirement.  Medication: None brought in. Pill/Patch Count: None available to be counted. Bottle Appearance: No container available. Did not bring bottle(s) to appointment. Filled Date: N/A Last Medication intake:   07/23/2022

## 2022-08-12 ENCOUNTER — Telehealth: Payer: Self-pay | Admitting: Pain Medicine

## 2022-08-12 ENCOUNTER — Telehealth: Payer: Self-pay

## 2022-08-12 ENCOUNTER — Other Ambulatory Visit: Payer: Self-pay

## 2022-08-12 NOTE — Telephone Encounter (Signed)
Gastroenterology Pre-Procedure Review Discussed colonoscopy triage with Office Mgr-Ginger Feldpausch, CMA she has advised to get Cardiac Clearance from Dr. Rockey Situ prior to scheduling colonoscopy, and advise patient if her bleeding is bright red and becomes excessive she should go to the ER.  Request Date: TBD after Cardiac Clearance Has Been Received Requesting Physician: Dr. Dellie Catholic  PATIENT REVIEW QUESTIONS: The patient responded to the following health history questions as indicated:    1. Are you having any GI issues? Rectal bleeding daily for 3 weeks with bowel movements, hemorrhoids.  Pt has been advised if her bleeding is bright red and/or becomes excessive she should go to the ER. 2. Do you have a personal history of Polyps? no 3. Do you have a family history of Colon Cancer or Polyps? yes (mother rectal polyps, maternal aunt colon cancer) 4. Diabetes Mellitus? yes (takes Metformin and Jardiance) 5. Joint replacements in the past 12 months?no 6. Major health problems in the past 3 months?no 7. Any artificial heart valves, MVP, or defibrillator?no    MEDICATIONS & ALLERGIES:    Patient reports the following regarding taking any anticoagulation/antiplatelet therapy:   Plavix, Coumadin, Eliquis, Xarelto, Lovenox, Pradaxa, Brilinta, or Effient? no Aspirin? yes (81 mg daily)  Patient confirms/reports the following medications:  Current Outpatient Medications  Medication Sig Dispense Refill   albuterol (VENTOLIN HFA) 108 (90 Base) MCG/ACT inhaler Inhale 2 puffs into the lungs every 6 (six) hours as needed for wheezing or shortness of breath.      aspirin EC 81 MG EC tablet Take 1 tablet (81 mg total) by mouth daily. 30 tablet 0   Blood Glucose Monitoring Suppl (ACCU-CHEK AVIVA PLUS) w/Device KIT See admin instructions.  0   busPIRone (BUSPAR) 10 MG tablet Take 20 mg by mouth 2 (two) times daily.     Cholecalciferol (VITAMIN D3) 50 MCG (2000 UT) capsule Take 1 capsule (2,000 Units total) by  mouth daily. Take 1 capsule (2,000 Units total) by mouth daily. 30 capsule 2   clonazePAM (KLONOPIN) 0.5 MG tablet Take 0.5 mg by mouth at bedtime as needed.     diltiazem (CARDIZEM CD) 120 MG 24 hr capsule TAKE 1 CAPSULE BY MOUTH EVERY DAY 90 capsule 3   diltiazem (CARDIZEM) 30 MG tablet Take 1 tablet (30 mg total) by mouth 3 (three) times daily as needed. 30 tablet 3   DULoxetine (CYMBALTA) 60 MG capsule Take 60 mg by mouth 2 (two) times daily.      fluconazole (DIFLUCAN) 150 MG tablet Take 150 mg by mouth once.     Fluticasone-Salmeterol (ADVAIR) 250-50 MCG/DOSE AEPB Inhale 1 puff into the lungs 2 (two) times daily.     furosemide (LASIX) 40 MG tablet TAKE 1 TABLET BY MOUTH EVERY DAY. MAY TAKE AN ADDITIONAL TABLET IN THE AM FOR WEIGHT GAIN AS NEEDED 110 tablet 3   JARDIANCE 25 MG TABS tablet Take 25 mg by mouth in the morning and at bedtime.     Magnesium 500 MG CAPS once daily.     metFORMIN (GLUCOPHAGE) 500 MG tablet Take 1 tablet (500 mg total) by mouth 2 (two) times daily with a meal. (Patient taking differently: Take 1,000 mg by mouth 2 (two) times daily with a meal.) 30 tablet 0   morphine (MS CONTIN) 15 MG 12 hr tablet Take 1 tablet (15 mg total) by mouth every 12 (twelve) hours. Must last 30 days. Do not break tablet 60 tablet 0   naloxone (NARCAN) nasal spray 4 mg/0.1 mL Place  1 spray into the nose as needed for up to 365 doses (for opioid-induced respiratory depresssion). In case of emergency (overdose), spray once into each nostril. If no response within 3 minutes, repeat application and call [phone]. 1 each 0   [START ON 09/01/2022] oxyCODONE (OXY IR/ROXICODONE) 5 MG immediate release tablet Take 1 tablet (5 mg total) by mouth 4 (four) times daily for 7 days. Max: 4/day. Must last 7 days. 28 tablet 0   [START ON 08/25/2022] oxyCODONE (OXY IR/ROXICODONE) 5 MG immediate release tablet Take 1 tablet (5 mg total) by mouth 3 (three) times daily for 7 days. Max: 3/day. Must last 7 days. 21 tablet 0    [START ON 08/18/2022] oxyCODONE (OXY IR/ROXICODONE) 5 MG immediate release tablet Take 1 tablet (5 mg total) by mouth 2 (two) times daily for 7 days. Max: 2/day. Must last 7 days. 14 tablet 0   oxyCODONE (OXY IR/ROXICODONE) 5 MG immediate release tablet Take 1 tablet (5 mg total) by mouth daily for 7 days. Max: 1/day. Must last 7 days. 7 tablet 0   OXYGEN Inhale 2 L into the lungs at bedtime.      pantoprazole (PROTONIX) 40 MG tablet Take 1 tablet (40 mg total) by mouth daily. 30 tablet 0   potassium chloride SA (KLOR-CON) 20 MEQ tablet Take 20 mEq by mouth 2 (two) times daily.     pregabalin (LYRICA) 150 MG capsule Take 1 capsule (150 mg total) by mouth 3 (three) times daily. 90 capsule 2   rosuvastatin (CRESTOR) 20 MG tablet Take 20 mg by mouth daily.     spironolactone (ALDACTONE) 25 MG tablet Take 1 tablet (25 mg total) by mouth every morning. 90 tablet 3   sucralfate (CARAFATE) 1 G tablet Take 1 tablet (1 g total) by mouth 4 (four) times daily -  with meals and at bedtime. (Patient taking differently: Take 1 g by mouth 2 (two) times daily.) 120 tablet 0   tiotropium (SPIRIVA) 18 MCG inhalation capsule Place 18 mcg into inhaler and inhale daily.     tiZANidine (ZANAFLEX) 4 MG tablet Take 4 mg by mouth 3 (three) times daily as needed.     zolpidem (AMBIEN) 5 MG tablet Take 5 mg by mouth at bedtime as needed.     No current facility-administered medications for this visit.    Patient confirms/reports the following allergies:  Allergies  Allergen Reactions   Tramadol Other (See Comments) and Palpitations    Heart Skipping Beats Heart palpatations   Penicillins Rash    Has patient had a PCN reaction causing immediate rash, facial/tongue/throat swelling, SOB or lightheadedness with hypotension: Yes Has patient had a PCN reaction causing severe rash involving mucus membranes or skin necrosis: No Has patient had a PCN reaction that required hospitalization: No Has patient had a PCN reaction  occurring within the last 10 years: No If all of the above answers are "NO", then may proceed with Cephalosporin use.    No orders of the defined types were placed in this encounter.   AUTHORIZATION INFORMATION Primary Insurance: 1D#: Group #:  Secondary Insurance: 1D#: Group #:  SCHEDULE INFORMATION: Date: TBD Time: Location: [place]

## 2022-08-12 NOTE — Telephone Encounter (Signed)
PA Complete by KT

## 2022-08-12 NOTE — Telephone Encounter (Signed)
PT stated that the pharmacy needs an PA in order to fill patient prescription. Please give patient a call once PA has been send in. Thanks

## 2022-08-13 ENCOUNTER — Telehealth: Payer: Self-pay | Admitting: Cardiovascular Disease

## 2022-08-13 ENCOUNTER — Telehealth: Payer: Self-pay | Admitting: *Deleted

## 2022-08-13 NOTE — Telephone Encounter (Signed)
   Name: CHELLIE VANLUE  DOB: 1966/05/30  MRN: [id number]  Primary Cardiologist: Ida Rogue, MD  Chart reviewed as part of pre-operative protocol coverage. Because of Merrianne Mccumbers Hennigan's past medical history and time since last visit, she will require a follow-up telephone visit in order to better assess preoperative cardiovascular risk.  Pre-op covering staff: - Please schedule appointment and call patient to inform them. If patient already had an upcoming appointment within acceptable timeframe, please add "pre-op clearance" to the appointment notes so provider is aware. - Please contact requesting surgeon's office via preferred method (i.e, phone, fax) to inform them of need for appointment prior to surgery.  No medications are indicated as needing held.  Elgie Collard, PA-C  08/13/2022, 10:32 AM

## 2022-08-13 NOTE — Telephone Encounter (Signed)
Pt agreeable to tele pre op appt 08/18/22 @ 9:20. Med rec and consent are done.

## 2022-08-13 NOTE — Telephone Encounter (Signed)
Pt agreeable to tele pre op appt 08/18/22 @ 9:20. Med rec and consent are done.      Patient Consent for Virtual Visit        Erica [name] has provided verbal consent on 08/13/2022 for a virtual visit (video or telephone).   CONSENT FOR VIRTUAL VISIT FOR:  Erica [name]  By participating in this virtual visit I agree to the following:  I hereby voluntarily request, consent and authorize [facility] and its employed or contracted physicians, physician assistants, nurse practitioners or other licensed health care professionals (the Practitioner), to provide me with telemedicine health care services (the "Services") as deemed necessary by the treating Practitioner. I acknowledge and consent to receive the Services by the Practitioner via telemedicine. I understand that the telemedicine visit will involve communicating with the Practitioner through live audiovisual communication technology and the disclosure of certain medical information by electronic transmission. I acknowledge that I have been given the opportunity to request an in-person assessment or other available alternative prior to the telemedicine visit and am voluntarily participating in the telemedicine visit.  I understand that I have the right to withhold or withdraw my consent to the use of telemedicine in the course of my care at any time, without affecting my right to future care or treatment, and that the Practitioner or I may terminate the telemedicine visit at any time. I understand that I have the right to inspect all information obtained and/or recorded in the course of the telemedicine visit and may receive copies of available information for a reasonable fee.  I understand that some of the potential risks of receiving the Services via telemedicine include:  Delay or interruption in medical evaluation due to technological equipment failure or disruption; Information transmitted may not be sufficient (e.g. poor  resolution of images) to allow for appropriate medical decision making by the Practitioner; and/or  In rare instances, security protocols could fail, causing a breach of personal health information.  Furthermore, I acknowledge that it is my responsibility to provide information about my medical history, conditions and care that is complete and accurate to the best of my ability. I acknowledge that Practitioner's advice, recommendations, and/or decision may be based on factors not within their control, such as incomplete or inaccurate data provided by me or distortions of diagnostic images or specimens that may result from electronic transmissions. I understand that the practice of medicine is not an exact science and that Practitioner makes no warranties or guarantees regarding treatment outcomes. I acknowledge that a copy of this consent can be made available to me via my patient portal ([facility]), or I can request a printed copy by calling the office of [facility].    I understand that my insurance will be billed for this visit.   I have read or had this consent read to me. I understand the contents of this consent, which adequately explains the benefits and risks of the Services being provided via telemedicine.  I have been provided ample opportunity to ask questions regarding this consent and the Services and have had my questions answered to my satisfaction. I give my informed consent for the services to be provided through the use of telemedicine in my medical care

## 2022-08-13 NOTE — Telephone Encounter (Signed)
   Pre-operative Risk Assessment    Patient Name: Erica [name]  DOB: 08-24-1965 MRN: [id number]      Request for Surgical Clearance    Procedure:   Colonoscopy   Date of Surgery:  Clearance TBD                                 Surgeon:  not indicated Surgeon's Group or Practice Name:  [facility]  Phone number:  [phone] Fax number:  [phone]   Type of Clearance Requested:   - Medical    Type of Anesthesia:  General    Additional requests/questions:    Manfred Arch   08/13/2022, 9:04 AM

## 2022-08-15 NOTE — Progress Notes (Unsigned)
Virtual Visit via Telephone Note   Because of Erica [name]'s co-morbid illnesses, she is at least at moderate risk for complications without adequate follow up.  This format is felt to be most appropriate for this patient at this time.  The patient did not have access to video technology/had technical difficulties with video requiring transitioning to audio format only (telephone).  All issues noted in this document were discussed and addressed.  No physical exam could be performed with this format.  Please refer to the patient's chart for her consent to telehealth for [facility].  Evaluation Performed:  Preoperative cardiovascular risk assessment _____________   Date:  08/18/2022   Patient ID:  Erica [name], DOB [birth year], MRN [id number] Patient Location:  Home Provider location:   Office  Primary Care Provider:  Denton Lank, MD Primary Cardiologist:  Ida Rogue, MD  Chief Complaint / Patient Profile   57 y.o. y/o female with a h/o tobacco abuse, COPD, HLD, type 2 DM, chronic HFpEF, pulmonary HTN, OSA, essential hypertension who is pending colonoscopy and presents today for telephonic preoperative cardiovascular risk assessment.  History of Present Illness    Erica [name] is a 57 y.o. female who presents via audio/video conferencing for a telehealth visit today.  Pt was last seen in cardiology clinic on 10/27/21 by Dr. Rockey Situ.  At that time LIV RALLIS was doing well.  The patient is now pending procedure as outlined above. Since her last visit, she denies chest pain, lower extremity edema, fatigue, palpitations, melena, hematuria, hemoptysis, diaphoresis, weakness, presyncope, syncope, orthopnea, and PND. She reports chronic shortness of breath that she feels is stable. She remains active at home and is able to achieve > 4 METS activity without concerning cardiac symptoms with caring for her dog and taking her on frequent walks that involves walking up and down  inclines and stairs in her home. Recent weight loss 2/2 bronchitis and poor appetite, she is feeling well at current weight and appetite has improved.   Past Medical History    Past Medical History:  Diagnosis Date   (HFpEF) heart failure with preserved ejection fraction ([place])    a. 05/2015 Echo: EF 60-65%, no rwma, PASP 54mmHg.   Acute pancreatitis 08/13/2018   Arthritis    Asthma    Bell's palsy    Bronchitis    CHF (congestive heart failure) (HCC)    COPD (chronic obstructive pulmonary disease) ([place])    Diabetes mellitus without complication ([place])    type II 03/2017   Gastric ulcer    Hyperlipidemia    Hypertension    OSA (obstructive sleep apnea)    a. did not tolerate CPAP.   Pancreatitis    07/2018   Shoulder injury    6/19   Past Surgical History:  Procedure Laterality Date   FLEXIBLE BRONCHOSCOPY N/A 06/20/2015   Procedure: FLEXIBLE BRONCHOSCOPY;  Surgeon: Laverle Hobby, MD;  Location: [facility] ORS;  Service: Pulmonary;  Laterality: N/A;   KNEE SURGERY Left    8 knee surgeries    Allergies  Allergies  Allergen Reactions   Tramadol Other (See Comments) and Palpitations    Heart Skipping Beats Heart palpatations   Penicillins Rash    Has patient had a PCN reaction causing immediate rash, facial/tongue/throat swelling, SOB or lightheadedness with hypotension: Yes Has patient had a PCN reaction causing severe rash involving mucus membranes or skin necrosis: No Has patient had a PCN reaction that required hospitalization: No Has patient had  a PCN reaction occurring within the last 10 years: No If all of the above answers are "NO", then may proceed with Cephalosporin use.    Home Medications    Prior to Admission medications   Medication Sig Start Date End Date Taking? Authorizing Provider  albuterol (VENTOLIN HFA) 108 (90 Base) MCG/ACT inhaler Inhale 2 puffs into the lungs every 6 (six) hours as needed for wheezing or shortness of breath.     [provider]  aspirin EC 81 MG EC tablet Take 1 tablet (81 mg total) by mouth daily. 06/23/15   Demetrios Loll, MD  Blood Glucose Monitoring Suppl (ACCU-CHEK AVIVA PLUS) w/Device KIT See admin instructions. 10/20/17   [provider]  busPIRone (BUSPAR) 30 MG tablet Take 30 mg by mouth 2 (two) times daily. PER PT STATES HER DOSE WAS INCREASED 02/14/20   [provider]  Cholecalciferol (VITAMIN D3) 50 MCG (2000 UT) capsule Take 1 capsule (2,000 Units total) by mouth daily. Take 1 capsule (2,000 Units total) by mouth daily. 06/23/20 08/13/22  Milinda Pointer, MD  clonazePAM (KLONOPIN) 0.5 MG tablet Take 0.5 mg by mouth at bedtime as needed. 03/12/20   [provider]  diltiazem (CARDIZEM CD) 120 MG 24 hr capsule TAKE 1 CAPSULE BY MOUTH EVERY DAY 10/27/21   Minna Merritts, MD  diltiazem (CARDIZEM) 30 MG tablet Take 1 tablet (30 mg total) by mouth 3 (three) times daily as needed. 10/29/21   Minna Merritts, MD  DULoxetine (CYMBALTA) 60 MG capsule Take 60 mg by mouth 2 (two) times daily.     [provider]  fluconazole (DIFLUCAN) 150 MG tablet Take 150 mg by mouth once. 07/23/21   [provider]  Fluticasone-Salmeterol (ADVAIR) 250-50 MCG/DOSE AEPB Inhale 1 puff into the lungs 2 (two) times daily.    [provider]  furosemide (LASIX) 40 MG tablet TAKE 1 TABLET BY MOUTH EVERY DAY. MAY TAKE AN ADDITIONAL TABLET IN THE AM FOR WEIGHT GAIN AS NEEDED 10/27/21   Minna Merritts, MD  JARDIANCE 25 MG TABS tablet Take 25 mg by mouth in the morning and at bedtime. 08/18/21   [provider]  Magnesium 500 MG CAPS once daily. 11/08/16   [provider]  metFORMIN (GLUCOPHAGE) 500 MG tablet Take 1 tablet (500 mg total) by mouth 2 (two) times daily with a meal. Patient taking differently: Take 1,000 mg by mouth 2 (two) times daily with a meal. 05/04/18   Gregor Hams, MD  morphine (MS CONTIN) 15 MG 12 hr tablet Take 1 tablet (15 mg total) by  mouth every 12 (twelve) hours. Must last 30 days. Do not break tablet 06/25/22 07/25/22  Milinda Pointer, MD  naloxone [facility]) nasal spray 4 mg/0.1 mL Place 1 spray into the nose as needed for up to 365 doses (for opioid-induced respiratory depresssion). In case of emergency (overdose), spray once into each nostril. If no response within 3 minutes, repeat application and call [phone]. 04/22/22 04/22/23  Milinda Pointer, MD  oxyCODONE (OXY IR/ROXICODONE) 5 MG immediate release tablet Take 1 tablet (5 mg total) by mouth 4 (four) times daily for 7 days. Max: 4/day. Must last 7 days. 09/01/22 09/08/22  Milinda Pointer, MD  oxyCODONE (OXY IR/ROXICODONE) 5 MG immediate release tablet Take 1 tablet (5 mg total) by mouth 3 (three) times daily for 7 days. Max: 3/day. Must last 7 days. 08/25/22 09/01/22  Milinda Pointer, MD  oxyCODONE (OXY IR/ROXICODONE) 5 MG immediate release tablet Take 1  tablet (5 mg total) by mouth 2 (two) times daily for 7 days. Max: 2/day. Must last 7 days. 08/18/22 08/25/22  Delano Metz, MD  oxyCODONE (OXY IR/ROXICODONE) 5 MG immediate release tablet Take 1 tablet (5 mg total) by mouth daily for 7 days. Max: 1/day. Must last 7 days. 08/11/22 08/18/22  Delano Metz, MD  OXYGEN Inhale 2 L into the lungs at bedtime.     [provider]  pantoprazole (PROTONIX) 40 MG tablet Take 1 tablet (40 mg total) by mouth daily. 05/04/18   Darci Current, MD  potassium chloride SA (KLOR-CON) 20 MEQ tablet Take 20 mEq by mouth 2 (two) times daily. 02/15/20   [provider]  pregabalin (LYRICA) 150 MG capsule Take 1 capsule (150 mg total) by mouth 3 (three) times daily. 06/23/20 08/13/22  Delano Metz, MD  rosuvastatin (CRESTOR) 20 MG tablet Take 20 mg by mouth daily. 08/18/21   [provider]  spironolactone (ALDACTONE) 25 MG tablet Take 1 tablet (25 mg total) by mouth every morning. 10/27/21   Antonieta Iba, MD  sucralfate (CARAFATE) 1 G tablet Take 1 tablet (1 g  total) by mouth 4 (four) times daily -  with meals and at bedtime. Patient taking differently: Take 1 g by mouth 2 (two) times daily. 06/23/15   Shaune Pollack, MD  tiotropium (SPIRIVA) 18 MCG inhalation capsule Place 18 mcg into inhaler and inhale daily.    [provider]  tiZANidine (ZANAFLEX) 4 MG tablet Take 4 mg by mouth 3 (three) times daily as needed. 09/14/21   [provider]  zolpidem (AMBIEN) 5 MG tablet Take 5 mg by mouth at bedtime as needed. 06/18/21   [provider]    Physical Exam    Vital Signs:  HADLEIGH FELBER does not have vital signs available for review today.  Given telephonic nature of communication, physical exam is limited. AAOx3. NAD. Normal affect.  Speech and respirations are unlabored.  Accessory Clinical Findings    None  Assessment & Plan    1.  Preoperative Cardiovascular Risk Assessment: The patient is doing well from a cardiac perspective. Therefore, based on ACC/AHA guidelines, the patient would be at acceptable risk for the planned procedure without further cardiovascular testing. According to the Revised Cardiac Risk Index (RCRI), her Perioperative Risk of Major Cardiac Event is (%): 0.9 Her Functional Capacity in METs is: 6.61 according to the [facility] Activity Status Index (DASI).   The patient was advised that if she develops new symptoms prior to surgery to contact our office to arrange for a follow-up visit, and she verbalized understanding.   A copy of this note will be routed to requesting surgeon.  Time:   Today, I have spent 10 minutes with the patient with telehealth technology discussing medical history, symptoms, and management plan.     Levi Aland, NP-C  08/18/2022, 9:21 AM [address] Office [phone] Fax [phone]

## 2022-08-18 ENCOUNTER — Encounter: Payer: Self-pay | Admitting: Nurse Practitioner

## 2022-08-18 ENCOUNTER — Ambulatory Visit: Payer: Medicaid Other | Attending: Cardiology | Admitting: Nurse Practitioner

## 2022-08-18 DIAGNOSIS — Z01810 Encounter for preprocedural cardiovascular examination: Secondary | ICD-10-CM | POA: Diagnosis not present

## 2022-08-22 NOTE — Progress Notes (Unsigned)
Patient: Erica [name]  Service Category: E/M  Provider: Gaspar Cola, MD  DOB: 19-Mar-1966  DOS: 08/25/2022  Location: Office  MRN: [id number]  Setting: Ambulatory outpatient  Referring Provider: Denton Lank, MD  Type: Established Patient  Specialty: Interventional Pain Management  PCP: Denton Lank, MD  Location: Remote location  Delivery: TeleHealth     Virtual Encounter - Pain Management PROVIDER NOTE: Information contained herein reflects review and annotations entered in association with encounter. Interpretation of such information and data should be left to medically-trained personnel. Information provided to patient can be located elsewhere in the medical record under "Patient Instructions". Document created using STT-dictation technology, any transcriptional errors that may result from process are unintentional.    Contact & Pharmacy Preferred: [phone] Home: [phone] (home) Mobile: [phone] (mobile) E-mail: [email or web address]  [place], [place] [address] [facility] [place] [address] Phone: [phone] Fax: [phone]   Pre-screening  Erica [name] offered "in-person" vs "virtual" encounter. She indicated preferring virtual for this encounter.   Reason COVID-19*  Social distancing based on CDC and AMA recommendations.   I contacted Erica [name] on 08/25/2022 via telephone.      I clearly identified myself as Gaspar Cola, MD. I verified that I was speaking with the correct person using two identifiers (Name: Erica [name], and date of birth: [birth year]).  Consent I sought verbal advanced consent from Erica [name] for virtual visit interactions. I informed Erica [name] of possible security and privacy concerns, risks, and limitations associated with providing "not-in-person" medical evaluation and management services. I also informed Erica [name] of the availability of "in-person" appointments.  Finally, I informed her that there would be a charge for the virtual visit and that she could be  personally, fully or partially, financially responsible for it. Erica [name] expressed understanding and agreed to proceed.   Historic Elements   Erica [name] is a 57 y.o. year old, female patient evaluated today after our last contact on 08/12/2022. Erica [name]  has a past medical history of (HFpEF) heart failure with preserved ejection fraction [facility]), Acute pancreatitis (08/13/2018), Arthritis, Asthma, Bell's palsy, Bronchitis, CHF (congestive heart failure) ([place]), COPD (chronic obstructive pulmonary disease) ([place]), Diabetes mellitus without complication ([place]), Gastric ulcer, Hyperlipidemia, Hypertension, OSA (obstructive sleep apnea), Pancreatitis, and Shoulder injury. She also  has a past surgical history that includes Knee surgery (Left) and Flexible bronchoscopy (N/A, 06/20/2015). Erica [name] has a current medication list which includes the following prescription(s): albuterol, aspirin ec, accu-chek aviva plus, buspirone, vitamin d3, clonazepam, diltiazem, diltiazem, duloxetine, fluconazole, fluticasone-salmeterol, furosemide, jardiance, magnesium, metformin, morphine, naloxone, [START ON 09/01/2022] oxycodone, [START ON 08/25/2022] oxycodone, oxycodone, oxycodone, oxygen-helium, pantoprazole, potassium chloride sa, pregabalin, rosuvastatin, spironolactone, sucralfate, tiotropium, tizanidine, and zolpidem. She  reports that she has been smoking cigarettes. She has a 36.00 pack-year smoking history. She has never used smokeless tobacco. She reports current alcohol use. She reports that she does not use drugs. Erica [name] is allergic to tramadol and penicillins.  Estimated body mass index is 24.94 kg/m as calculated from the following:   Height as of 08/11/22: 5\' 8"  (1.727 m).   Weight as of 08/11/22: 164 lb (74.4 kg).  HPI  Today, she is being contacted for  ***   Pharmacotherapy Assessment   Opioid  Analgesic: (08/11/2022) morphine ER (MS Contin) 15 mg, 1 tab p.o. every 12 hours (30 mg/day of morphine) switched to oxycodone IR 5 mg for  the purpose of slowly tapering the patient off of her opioids. MME/day: 30 mg/day.   Monitoring: [place] PMP: PDMP reviewed during this encounter.       Pharmacotherapy: No side-effects or adverse reactions reported. Compliance: No problems identified. Effectiveness: Clinically acceptable. Plan: Refer to "POC". UDS:  Summary  Date Value Ref Range Status  10/26/2021 Note  Final    Comment:    ==================================================================== [employer] Select 13 (MW) ==================================================================== Test                             Result       Flag       Units  Drug Present and Declared for Prescription Verification   Morphine                       >[id number]       EXPECTED   ng/mg creat   Normorphine                    374          EXPECTED   ng/mg creat    Potential sources of large amounts of morphine in the absence of    codeine include administration of morphine or use of heroin.     Normorphine is an expected metabolite of morphine.  Drug Absent but Declared for Prescription Verification   Clonazepam                     Not Detected UNEXPECTED ng/mg creat ==================================================================== Test                      Result    Flag   Units      Ref Range   Creatinine              39               mg/dL      >=20 ==================================================================== Declared Medications:  The flagging and interpretation on this report are based on the  following declared medications.  Unexpected results may arise from  inaccuracies in the declared medications.   **Note: The testing scope of this panel includes these medications:   Clonazepam (Klonopin)  Morphine (MS Contin)   **Note: The testing scope of this panel does not include the   following reported medications:   Albuterol (Ventolin HFA)  Aspirin  Buspirone (Buspar)  Diltiazem (Cardizem)  Duloxetine (Cymbalta)  Empagliflozin (Jardiance)  Fluconazole (Diflucan)  Fluticasone (Advair)  Furosemide (Lasix)  Magnesium  Metformin  Pantoprazole (Protonix)  Potassium (Klor-Con)  Pregabalin (Lyrica)  Rosuvastatin (Crestor)  Salmeterol (Advair)  Spironolactone (Aldactone)  Sucralfate (Carafate)  Tiotropium (Spiriva)  Tizanidine (Zanaflex)  Vitamin D3  Zolpidem (Ambien) ==================================================================== For clinical consultation, please call [phone]. ====================================================================    No results found for: "CBDTHCR", "D8THCCBX", "D9THCCBX"   Laboratory Chemistry Profile   Renal Lab Results  Component Value Date   BUN 10 04/22/2022   CREATININE 0.67 04/22/2022   LABCREA 138 06/14/2015   BCR 15 04/22/2016   GFRAA >60 09/12/2018   GFRNONAA >60 04/22/2022    Hepatic Lab Results  Component Value Date   AST 31 04/22/2022   ALT 16 04/22/2022   ALBUMIN 3.4 (L) 04/22/2022   ALKPHOS 87 04/22/2022   HCVAB <0.1 08/12/2018   LIPASE 38 09/10/2018    Electrolytes Lab Results  Component Value Date  NA 138 04/22/2022   K 3.9 04/22/2022   CL 101 04/22/2022   CALCIUM 9.2 04/22/2022   MG 1.6 (L) 02/01/2022   PHOS 1.1 (L) 09/12/2018    Bone Lab Results  Component Value Date   25OHVITD1 15 (L) 04/30/2016   25OHVITD2 <1.0 04/30/2016   25OHVITD3 15 04/30/2016    Inflammation (CRP: Acute Phase) (ESR: Chronic Phase) Lab Results  Component Value Date   CRP 1.3 (H) 04/30/2016   ESRSEDRATE 31 (H) 04/30/2016   LATICACIDVEN 1.7 04/22/2022         Note: Above Lab results reviewed.  Imaging  US Venous Img Lower Unilateral Right CLINICAL DATA:  Right lower extremity swelling  EXAM: RIGHT LOWER EXTREMITY VENOUS DOPPLER ULTRASOUND  TECHNIQUE: Gray-scale sonography with  compression, as well as color and duplex ultrasound, were performed to evaluate the deep venous system(s) from the level of the common femoral vein through the popliteal and proximal calf veins.  COMPARISON:  None Available.  FINDINGS: VENOUS  Normal compressibility of the common femoral, superficial femoral, and popliteal veins, as well as the visualized calf veins. Visualized portions of profunda femoral vein and great saphenous vein unremarkable. No filling defects to suggest DVT on grayscale or color Doppler imaging. Doppler waveforms show normal direction of venous flow, normal respiratory plasticity and response to augmentation.  Limited views of the contralateral common femoral vein are unremarkable.  OTHER  None.  Limitations: none  IMPRESSION: Negative.  Electronically Signed   By: Helyn Numbers M.D.   On: 04/22/2022 20:08 DG Tibia/Fibula Right CLINICAL DATA:  Leg swelling.  EXAM: RIGHT TIBIA AND FIBULA - 2 VIEW  COMPARISON:  None Available.  FINDINGS: There is no evidence of fracture or other focal bone lesions. There is diffuse soft tissue swelling. No evidence for foreign body.  IMPRESSION: Diffuse soft tissue swelling.  Electronically Signed   By: Darliss Cheney M.D.   On: 04/22/2022 19:14  Assessment  There were no encounter diagnoses.  Plan of Care  Problem-specific:  No problem-specific Assessment & Plan notes found for this encounter.  Erica [name] has a current medication list which includes the following long-term medication(s): vitamin d3, clonazepam, diltiazem, diltiazem, duloxetine, fluticasone-salmeterol, furosemide, metformin, morphine, [START ON 09/01/2022] oxycodone, [START ON 08/25/2022] oxycodone, oxycodone, oxycodone, pantoprazole, pregabalin, rosuvastatin, spironolactone, sucralfate, tiotropium, tizanidine, and zolpidem.  Pharmacotherapy (Medications Ordered): No orders of the defined types were placed in this  encounter.  Orders:  No orders of the defined types were placed in this encounter.  Follow-up plan:   No follow-ups on file.     Interventional Therapies  Risk  Complexity Considerations:   OSA  CHF  COPD  COPD  tobacco use disorder  pulmonary hypertension T2DM  HTN  Elevated LFTs  renal insufficiency  PTSD  depression  BNZ use Noncompliance with keeping appointments.  Unkept appointment count (since 2020): Cancelled or rescheduled: 08/09/2018, 09/19/2018, 09/20/2018, 09/27/2018, 10/24/2018, 01/01/2019, 01/23/2019, 02/12/2019, 03/22/2019, 06/19/2019, 07/31/2019, 08/23/2019, 11/19/2019, 03/10/2020, 01/21/2021, 10/07/2021, and 04/19/2022. NO SHOW: 02/26/2020, 06/23/2020, 06/25/2020, 09/29/2020, and 01/28/2021. Switched from F2F to virtual: 10/02/2020, 02/11/2021, 06/15/2021, 09/28/2021   Planned  Pending:   X-rays of both hips and the lumbar spine with flexion views (08/11/2022)    Under consideration:   Diagnostic/therapeutic bilateral IA hip joint injection  Diagnostic/therapeutic bilateral lumbar facet block #4 (first of 2024) (order entered on 01/18/2022) Therapeutic bilateral lumbar facet RFA (L3  R2)    Completed:   Therapeutic left lumbar facet RFA x2 (06/28/2019)  Therapeutic  right lumbar facet RFA x1 (12/15/2016)  Therapeutic bilateral lumbar facet MBB x3 (10/25/2017)  Therapeutic bilateral IA Hyalgan knee injection x4 (11/08/2017)  Therapeutic left IA hip injection x1 (03/15/2017)    Therapeutic  Palliative (PRN) options:   Therapeutic lumbar facet RFA  Palliative hip joint inj.   Palliative lumbar facet MBB    Therapeutic/palliative bilateral viscosupplementation knee inj.    Pharmacotherapy  Nonopioids transferred 06/23/2020: Zanaflex, Lyrica, and vitamin D3 Opioid downward taper started 08/11/2022 for the purpose of permanently discontinuing opioid analgesic therapy.  The patient has not been compliant with her monitoring.  She had previously been warned about that practice.       Recent Visits Date Type Provider Dept  08/11/22 Office Visit Milinda Pointer, MD [facility]  Showing recent visits within past 90 days and meeting all other requirements Future Appointments Date Type Provider Dept  08/25/22 Appointment Milinda Pointer, MD [facility]  Showing future appointments within next 90 days and meeting all other requirements  I discussed the assessment and treatment plan with the patient. The patient was provided an opportunity to ask questions and all were answered. The patient agreed with the plan and demonstrated an understanding of the instructions.  Patient advised to call back or seek an in-person evaluation if the symptoms or condition worsens.  Duration of encounter: *** minutes.  Note by: Gaspar Cola, MD Date: 08/25/2022; Time: 3:35 PM

## 2022-08-23 ENCOUNTER — Ambulatory Visit
Admission: RE | Admit: 2022-08-23 | Discharge: 2022-08-23 | Disposition: A | Discharge status: Home / Self Care | Payer: Medicaid Other | Source: Ambulatory Visit | Attending: Pain Medicine | Admitting: Pain Medicine

## 2022-08-23 DIAGNOSIS — G8929 Other chronic pain: Secondary | ICD-10-CM

## 2022-08-23 DIAGNOSIS — M545 Low back pain, unspecified: Secondary | ICD-10-CM | POA: Insufficient documentation

## 2022-08-23 DIAGNOSIS — M25551 Pain in right hip: Secondary | ICD-10-CM | POA: Diagnosis present

## 2022-08-23 DIAGNOSIS — M25552 Pain in left hip: Secondary | ICD-10-CM | POA: Diagnosis present

## 2022-08-23 DIAGNOSIS — M5137 Other intervertebral disc degeneration, lumbosacral region: Secondary | ICD-10-CM | POA: Insufficient documentation

## 2022-08-23 NOTE — Telephone Encounter (Signed)
Clearance has been granted for patient by Dr. Rockey Situ to have her colonoscopy, however patient stated that she is still experiencing rectal bleeding.  She has been informed that we will need to schedule her an office visit.  Thanks, [place], [place]

## 2022-08-25 ENCOUNTER — Telehealth: Payer: Self-pay

## 2022-08-25 ENCOUNTER — Ambulatory Visit: Payer: Medicaid Other | Attending: Pain Medicine | Admitting: Pain Medicine

## 2022-08-25 DIAGNOSIS — G8929 Other chronic pain: Secondary | ICD-10-CM

## 2022-08-25 DIAGNOSIS — M25562 Pain in left knee: Secondary | ICD-10-CM

## 2022-08-25 DIAGNOSIS — M545 Low back pain, unspecified: Secondary | ICD-10-CM | POA: Diagnosis not present

## 2022-08-25 DIAGNOSIS — G894 Chronic pain syndrome: Secondary | ICD-10-CM | POA: Diagnosis not present

## 2022-08-25 DIAGNOSIS — Z79891 Long term (current) use of opiate analgesic: Secondary | ICD-10-CM

## 2022-08-25 DIAGNOSIS — Z79899 Other long term (current) drug therapy: Secondary | ICD-10-CM

## 2022-08-25 DIAGNOSIS — M160 Bilateral primary osteoarthritis of hip: Secondary | ICD-10-CM | POA: Diagnosis not present

## 2022-08-25 DIAGNOSIS — M5137 Other intervertebral disc degeneration, lumbosacral region: Secondary | ICD-10-CM

## 2022-08-25 DIAGNOSIS — M5030 Other cervical disc degeneration, unspecified cervical region: Secondary | ICD-10-CM

## 2022-08-25 DIAGNOSIS — M25561 Pain in right knee: Secondary | ICD-10-CM | POA: Diagnosis not present

## 2022-08-25 NOTE — Patient Instructions (Signed)

## 2022-08-25 NOTE — Telephone Encounter (Signed)
Pharmacy called and all outstanding opioid prescriptions cancelled per Dr Dossie Arbour. Spoke with Wells Guiles.

## 2022-08-27 NOTE — Progress Notes (Deleted)
PROVIDER NOTE: Interpretation of information contained herein should be left to medically-trained personnel. Specific patient instructions are provided elsewhere under "Patient Instructions" section of medical record. This document was created in part using STT-dictation technology, any transcriptional errors that may result from this process are unintentional.  Patient: Erica [name] Type: Established DOB: Mar 09, 1966 MRN: [id number] PCP: Denton Lank, MD  Service: Procedure DOS: 08/31/2022 Setting: Ambulatory Location: Ambulatory outpatient facility Delivery: Face-to-face Provider: Gaspar Cola, MD Specialty: Interventional Pain Management Specialty designation: 09 Location: Outpatient facility Ref. Prov.: Milinda Pointer, MD       Interventional Therapy   Procedure: Hip & bursae injection #1  Laterality: Bilateral (-50)  Bursae: Trochanteric  Laterality: Bilateral (-50)  Approach: Percutaneous posterolateral approach. Level: Lower pelvic and hip joint level.  Imaging: Fluoroscopy-guided         Anesthesia: Local anesthesia (1-2% Lidocaine) Anxiolysis: None                 Sedation:                         DOS: 08/31/2022  Performed by: Gaspar Cola, MD  Purpose: Diagnostic/Therapeutic Indications: Hip pain severe enough to impact quality of life or function. Rationale (medical necessity): procedure needed and proper for the diagnosis and/or treatment of Erica [name]'s medical symptoms and needs. No diagnosis found. NAS-11 Pain score:   Pre-procedure:  /10   Post-procedure:  /10      Target: Intra-articular aspect of the hip joint & peri-articular bursae Region: Hip joint proper. Femoral region Procedure Type: Percutaneous injection   Position / Prep / Materials:  Position:    ***      Prep solution: DuraPrep (Iodine Povacrylex [0.7% available iodine] and Isopropyl Alcohol, 74% w/w) Prep Area:  Entire Posterolateral hip area. Materials:  Tray: Block  tray Needle(s):  Type: Spinal  Gauge (G): 22  Length:    ***     Qty:    ***     Pre-op H&P Assessment:  Erica [name] is a 57 y.o. (year old), female patient, seen today for interventional treatment. She  has a past surgical history that includes Knee surgery (Left) and Flexible bronchoscopy (N/A, 06/20/2015). Erica [name] has a current medication list which includes the following prescription(s): albuterol, aspirin ec, accu-chek aviva plus, buspirone, vitamin d3, clonazepam, diltiazem, diltiazem, duloxetine, fluconazole, fluticasone-salmeterol, furosemide, jardiance, magnesium, metformin, morphine, naloxone, [START ON 09/01/2022] oxycodone, oxycodone, oxycodone, oxycodone, oxygen-helium, pantoprazole, potassium chloride sa, pregabalin, rosuvastatin, spironolactone, sucralfate, tiotropium, tizanidine, and zolpidem. Her primarily concern today is the No chief complaint on file.  Initial Vital Signs:  Pulse/HCG Rate:    Temp:   Resp:   BP:   SpO2:    BMI: Estimated body mass index is 24.94 kg/m as calculated from the following:   Height as of 08/11/22: 5\' 8"  (1.727 m).   Weight as of 08/11/22: 164 lb (74.4 kg).  Risk Assessment: Allergies: Reviewed. She is allergic to tramadol and penicillins.  Allergy Precautions: None required Coagulopathies: Reviewed. None identified.  Blood-thinner therapy: None at this time Active Infection(s): Reviewed. None identified. Erica [name] is afebrile  Site Confirmation: Erica [name] was asked to confirm the procedure and laterality before marking the site Procedure checklist: Completed Consent: Before the procedure and under the influence of no sedative(s), amnesic(s), or anxiolytics, the patient was informed of the treatment options, risks and possible complications. To fulfill our ethical and legal obligations, as recommended by the [employer]'s  Code of Ethics, I have informed the patient of my clinical impression; the nature and purpose of the  treatment or procedure; the risks, benefits, and possible complications of the intervention; the alternatives, including doing nothing; the risk(s) and benefit(s) of the alternative treatment(s) or procedure(s); and the risk(s) and benefit(s) of doing nothing. The patient was provided information about the general risks and possible complications associated with the procedure. These may include, but are not limited to: failure to achieve desired goals, infection, bleeding, organ or nerve damage, allergic reactions, paralysis, and death. In addition, the patient was informed of those risks and complications associated to the procedure, such as failure to decrease pain; infection; bleeding; organ or nerve damage with subsequent damage to sensory, motor, and/or autonomic systems, resulting in permanent pain, numbness, and/or weakness of one or several areas of the body; allergic reactions; (i.e.: anaphylactic reaction); and/or death. Furthermore, the patient was informed of those risks and complications associated with the medications. These include, but are not limited to: allergic reactions (i.e.: anaphylactic or anaphylactoid reaction(s)); adrenal axis suppression; blood sugar elevation that in diabetics may result in ketoacidosis or comma; water retention that in patients with history of congestive heart failure may result in shortness of breath, pulmonary edema, and decompensation with resultant heart failure; weight gain; swelling or edema; medication-induced neural toxicity; particulate matter embolism and blood vessel occlusion with resultant organ, and/or nervous system infarction; and/or aseptic necrosis of one or more joints. Finally, the patient was informed that Medicine is not an exact science; therefore, there is also the possibility of unforeseen or unpredictable risks and/or possible complications that may result in a catastrophic outcome. The patient indicated having understood very clearly. We  have given the patient no guarantees and we have made no promises. Enough time was given to the patient to ask questions, all of which were answered to the patient's satisfaction. Erica [name] has indicated that she wanted to continue with the procedure. Attestation: I, the ordering provider, attest that I have discussed with the patient the benefits, risks, side-effects, alternatives, likelihood of achieving goals, and potential problems during recovery for the procedure that I have provided informed consent. Date  Time: {CHL [facility]-PAIN TIME CHOICES:21018001}  Pre-Procedure Preparation:  Monitoring: As per clinic protocol. Respiration, ETCO2, SpO2, BP, heart rate and rhythm monitor placed and checked for adequate function Safety Precautions: Patient was assessed for positional comfort and pressure points before starting the procedure. Time-out: I initiated and conducted the "Time-out" before starting the procedure, as per protocol. The patient was asked to participate by confirming the accuracy of the "Time Out" information. Verification of the correct person, site, and procedure were performed and confirmed by me, the nursing staff, and the patient. "Time-out" conducted as per Joint Commission's Universal Protocol (UP.01.01.01). Time:    Description/Narrative of Procedure:          Rationale (medical necessity): procedure needed and proper for the diagnosis and/or treatment of the patient's medical symptoms and needs. Procedural Technique Safety Precautions: Aspiration looking for blood return was conducted prior to all injections. At no point did we inject any substances, as a needle was being advanced. No attempts were made at seeking any paresthesias. Safe injection practices and needle disposal techniques used. Medications properly checked for expiration dates. SDV (single dose vial) medications used. Description of the Procedure: Protocol guidelines were followed. The patient was assisted into a  comfortable position. The target area was identified and the area prepped in the usual manner. Skin & deeper  tissues infiltrated with local anesthetic. Appropriate amount of time allowed to pass for local anesthetics to take effect. The procedure needles were then advanced to the target area. Proper needle placement secured. Negative aspiration confirmed. Solution injected in intermittent fashion, asking for systemic symptoms every 0.5cc of injectate. The needles were then removed and the area cleansed, making sure to leave some of the prepping solution back to take advantage of its long term bactericidal properties.  Technical description of procedure:  Skin & deeper tissues infiltrated with local anesthetic. Appropriate amount of time allowed to pass for local anesthetics to take effect. The procedure needles were then advanced to the target area. Proper needle placement secured. Negative aspiration confirmed. Solution injected in intermittent fashion, asking for systemic symptoms every 0.5cc of injectate. The needles were then removed and the area cleansed, making sure to leave some of the prepping solution back to take advantage of its long term bactericidal properties.             There were no vitals filed for this visit.   Start Time:   hrs. End Time:   hrs.  Imaging Guidance (Non-Spinal):          Type of Imaging Technique: Fluoroscopy Guidance (Non-Spinal) Indication(s): Assistance in needle guidance and placement for procedures requiring needle placement in or near specific anatomical locations not easily accessible without such assistance. Exposure Time: Please see nurses notes. Contrast: Before injecting any contrast, we confirmed that the patient did not have an allergy to iodine, shellfish, or radiological contrast. Once satisfactory needle placement was completed at the desired level, radiological contrast was injected. Contrast injected under live fluoroscopy. No contrast  complications. See chart for type and volume of contrast used. Fluoroscopic Guidance: I was personally present during the use of fluoroscopy. "Tunnel Vision Technique" used to obtain the best possible view of the target area. Parallax error corrected before commencing the procedure. "Direction-depth-direction" technique used to introduce the needle under continuous pulsed fluoroscopy. Once target was reached, antero-posterior, oblique, and lateral fluoroscopic projection used confirm needle placement in all planes. Images permanently stored in EMR. Interpretation: I personally interpreted the imaging intraoperatively. Adequate needle placement confirmed in multiple planes. Appropriate spread of contrast into desired area was observed. No evidence of afferent or efferent intravascular uptake. Permanent images saved into the patient's record.  Post-operative Assessment:  Post-procedure Vital Signs:  Pulse/HCG Rate:    Temp:   Resp:   BP:   SpO2:    EBL: None  Complications: No immediate post-treatment complications observed by team, or reported by patient.  Note: The patient tolerated the entire procedure well. A repeat set of vitals were taken after the procedure and the patient was kept under observation following institutional policy, for this type of procedure. Post-procedural neurological assessment was performed, showing return to baseline, prior to discharge. The patient was provided with post-procedure discharge instructions, including a section on how to identify potential problems. Should any problems arise concerning this procedure, the patient was given instructions to immediately contact us, at any time, without hesitation. In any case, we plan to contact the patient by telephone for a follow-up status report regarding this interventional procedure.  Comments:  No additional relevant information.  Plan of Care (POC)  Orders:  No orders of the defined types were placed in this  encounter.  Chronic Opioid Analgesic:  (08/11/2022) morphine ER (MS Contin) 15 mg, 1 tab p.o. every 12 hours (30 mg/day of morphine) switched to oxycodone IR 5 mg for  the purpose of slowly tapering the patient off of her opioids. MME/day: 30 mg/day.   Medications ordered for procedure: No orders of the defined types were placed in this encounter.  Medications administered: Kamie A. Freimark had no medications administered during this visit.  See the medical record for exact dosing, route, and time of administration.  Follow-up plan:   No follow-ups on file.       Interventional Therapies  Risk  Complexity Considerations:   OSA  CHF  COPD  COPD  tobacco use disorder  pulmonary hypertension T2DM  HTN  Elevated LFTs  renal insufficiency  PTSD  depression  BNZ use Noncompliance with keeping appointments.  Unkept appointment count (since 2020): Cancelled or rescheduled: 08/09/2018, 09/19/2018, 09/20/2018, 09/27/2018, 10/24/2018, 01/01/2019, 01/23/2019, 02/12/2019, 03/22/2019, 06/19/2019, 07/31/2019, 08/23/2019, 11/19/2019, 03/10/2020, 01/21/2021, 10/07/2021, and 04/19/2022. NO SHOW: 02/26/2020, 06/23/2020, 06/25/2020, 09/29/2020, and 01/28/2021. Switched from [place] to virtual: 10/02/2020, 02/11/2021, 06/15/2021, 09/28/2021   Planned  Pending:   Diagnostic/therapeutic bilateral IA hip joint injection #1    Under consideration:   Diagnostic/therapeutic bilateral IA hip joint injection  Diagnostic/therapeutic bilateral lumbar facet block #4 (first of 2024) (order entered on 01/18/2022) Therapeutic bilateral lumbar facet RFA (L3  R2)    Completed:   Therapeutic left lumbar facet RFA x2 (06/28/2019)  Therapeutic right lumbar facet RFA x1 (12/15/2016)  Therapeutic bilateral lumbar facet MBB x3 (10/25/2017)  Therapeutic bilateral IA Hyalgan knee injection x4 (11/08/2017)  Therapeutic left IA hip injection x1 (03/15/2017)    Therapeutic  Palliative (PRN) options:   Therapeutic lumbar facet RFA  Palliative hip  joint inj.   Palliative lumbar facet MBB    Therapeutic/palliative bilateral viscosupplementation knee inj.    Pharmacotherapy  Nonopioids transferred 06/23/2020: Zanaflex, Lyrica, and vitamin D3 Opioid downward taper started 08/11/2022 for the purpose of permanently discontinuing opioid analgesic therapy.  The patient has not been compliant with her monitoring.  She had previously been warned about that practice.  Downward taper completed 08/25/2022.       Recent Visits Date Type Provider Dept  08/25/22 Office Visit Milinda Pointer, MD [facility]  08/11/22 Office Visit Milinda Pointer, MD [facility]  Showing recent visits within past 90 days and meeting all other requirements Future Appointments Date Type Provider Dept  08/31/22 Appointment Milinda Pointer, MD [facility]  Showing future appointments within next 90 days and meeting all other requirements  Disposition: Discharge home  Discharge (Date  Time): 08/31/2022;   hrs.   Primary Care Physician: Denton Lank, MD Location: [facility] Note by: Gaspar Cola, MD Date: 08/31/2022; Time: 11:34 AM  Disclaimer:  Medicine is not an Chief Strategy Officer. The only guarantee in medicine is that nothing is guaranteed. It is important to note that the decision to proceed with this intervention was based on the information collected from the patient. The Data and conclusions were drawn from the patient's questionnaire, the interview, and the physical examination. Because the information was provided in large part by the patient, it cannot be guaranteed that it has not been purposely or unconsciously manipulated. Every effort has been made to obtain as much relevant data as possible for this evaluation. It is important to note that the conclusions that lead to this procedure are derived in large part from the available data. Always take into account that the treatment will also  be dependent on availability of resources and existing treatment guidelines, considered by other Pain Management Practitioners as being common knowledge and  practice, at the time of the intervention. For Medico-Legal purposes, it is also important to point out that variation in procedural techniques and pharmacological choices are the acceptable norm. The indications, contraindications, technique, and results of the above procedure should only be interpreted and judged by a Board-Certified Interventional Pain Specialist with extensive familiarity and expertise in the same exact procedure and technique.

## 2022-08-31 ENCOUNTER — Ambulatory Visit: Payer: Medicaid Other | Admitting: Pain Medicine

## 2022-09-06 ENCOUNTER — Other Ambulatory Visit: Payer: Self-pay | Admitting: Pain Medicine

## 2022-09-06 ENCOUNTER — Telehealth: Payer: Self-pay | Admitting: Pain Medicine

## 2022-09-06 DIAGNOSIS — M79642 Pain in left hand: Secondary | ICD-10-CM

## 2022-09-06 DIAGNOSIS — G8929 Other chronic pain: Secondary | ICD-10-CM

## 2022-09-06 DIAGNOSIS — G894 Chronic pain syndrome: Secondary | ICD-10-CM

## 2022-09-06 DIAGNOSIS — Z79891 Long term (current) use of opiate analgesic: Secondary | ICD-10-CM

## 2022-09-06 DIAGNOSIS — M51379 Other intervertebral disc degeneration, lumbosacral region without mention of lumbar back pain or lower extremity pain: Secondary | ICD-10-CM

## 2022-09-06 DIAGNOSIS — M5137 Other intervertebral disc degeneration, lumbosacral region: Secondary | ICD-10-CM

## 2022-09-06 DIAGNOSIS — M5030 Other cervical disc degeneration, unspecified cervical region: Secondary | ICD-10-CM

## 2022-09-06 DIAGNOSIS — Z79899 Other long term (current) drug therapy: Secondary | ICD-10-CM

## 2022-09-06 NOTE — Telephone Encounter (Signed)
Dr. Dossie Arbour is investigating this. All scripts for opioids were cancelled on 08-25-22. There was still one for 09-01-22 that was never filled.

## 2022-09-06 NOTE — Telephone Encounter (Signed)
PT stated that when she call pharmacy to get refill on her oxycodone prescription. PT was told that all of her prescriptions had been cancel, PT stated that she isn't aware why. Please give patient a call. TY

## 2022-09-06 NOTE — Telephone Encounter (Signed)
No additional prescriptions will be written per Dr. Dossie Arbour. Patient notified.

## 2022-09-28 ENCOUNTER — Ambulatory Visit (HOSPITAL_BASED_OUTPATIENT_CLINIC_OR_DEPARTMENT_OTHER): Payer: Medicaid Other | Admitting: Pain Medicine

## 2022-09-28 DIAGNOSIS — Z91199 Patient's noncompliance with other medical treatment and regimen due to unspecified reason: Secondary | ICD-10-CM

## 2022-09-28 NOTE — Progress Notes (Signed)
(  09/28/2022) NO-SHOW to procedure appointment.

## 2022-12-02 ENCOUNTER — Other Ambulatory Visit: Payer: Self-pay | Admitting: Cardiovascular Disease

## 2022-12-02 NOTE — Telephone Encounter (Signed)
Please contact pt for future appointment. °Pt overdue for 12 month f/u. °Pt needing refills. °

## 2022-12-02 NOTE — Telephone Encounter (Signed)
Pt said that she will call back to schedule an appointment when she has her calendar with her. 

## 2022-12-02 NOTE — Telephone Encounter (Signed)
Pt said that she will call back to schedule an appointment when she has her calendar with her.

## 2022-12-03 ENCOUNTER — Other Ambulatory Visit: Payer: Self-pay | Admitting: *Deleted

## 2022-12-03 MED ORDER — DILTIAZEM HCL 30 MG PO TABS: ORAL_TABLET | ORAL | 0 refills | Status: DC

## 2022-12-03 NOTE — Telephone Encounter (Signed)
Pt is scheduled on 8/13

## 2022-12-09 ENCOUNTER — Other Ambulatory Visit: Payer: Self-pay

## 2022-12-09 ENCOUNTER — Encounter: Payer: Self-pay | Admitting: Gastroenterology

## 2022-12-09 ENCOUNTER — Ambulatory Visit: Payer: Medicaid Other | Admitting: Gastroenterology

## 2022-12-09 VITALS — BP 125/77 | HR 125 | Temp 98.1°F | Ht 68.0 in | Wt 161.8 lb

## 2022-12-09 DIAGNOSIS — K625 Hemorrhage of anus and rectum: Secondary | ICD-10-CM

## 2022-12-09 MED ORDER — NA SULFATE-K SULFATE-MG SULF 17.5-3.13-1.6 GM/177ML PO SOLN: 354.0000 mL | Freq: Once | ORAL | 0 refills | Status: AC

## 2022-12-09 NOTE — Progress Notes (Signed)
Wyline Mood MD, MRCP(U.K) [address]  [address]  [place], [place] [address]  Main: [phone]  Fax: [phone]   Gastroenterology Consultation  Referring Provider:     Hillery Aldo, MD Primary Care Physician:  Hillery Aldo, MD Primary Gastroenterologist:  Dr. Wyline Mood  Reason for Consultation:   Rectal bleeding         HPI:   Erica [name] is a 57 y.o. y/o female referred for consultation & management  by Dr. Hillery Aldo, MD.      Rectal bleeding :  Onset and where was blood seen  : Again in December continues since then blood seen in the toilet bowl on the tissue paper, having episodes of incontinence Frequency of bowel movements : Every day but not satisfactory Consistency : Not normal watery Change in shape of stool: Yes Pain associated with bowel movements: Yes at times Blood thinner usage: None NSAID's: None none Prior colonoscopy : None Family history of colon cancer or polyps: Aunt had colon cancer Weight loss: 5 to 10 pounds over the last few months She is a smoker    Past Medical History:  Diagnosis Date   (HFpEF) heart failure with preserved ejection fraction (HCC)    a. 05/2015 Echo: EF 60-65%, no rwma, PASP .   Acute pancreatitis 08/13/2018   Arthritis    Asthma    Bell's palsy    Bronchitis    CHF (congestive heart failure) (HCC)    COPD (chronic obstructive pulmonary disease) (HCC)    Diabetes mellitus without complication (HCC)    type II 03/2017   Gastric ulcer    Hyperlipidemia    Hypertension    OSA (obstructive sleep apnea)    a. did not tolerate CPAP.   Pancreatitis    07/2018   Shoulder injury    6/19    Past Surgical History:  Procedure Laterality Date   FLEXIBLE BRONCHOSCOPY N/A 06/20/2015   Procedure: FLEXIBLE BRONCHOSCOPY;  Surgeon: Shane Crutch, MD;  Location: [facility] ORS;  Service: Pulmonary;  Laterality: N/A;   KNEE SURGERY Left    8 knee surgeries    Prior to Admission medications   Medication  Sig Start Date End Date Taking? Authorizing Provider  albuterol (VENTOLIN HFA) 108 (90 Base) MCG/ACT inhaler Inhale 2 puffs into the lungs every 6 (six) hours as needed for wheezing or shortness of breath.     [provider]  aspirin EC 81 MG EC tablet Take 1 tablet (81 mg total) by mouth daily. 06/23/15   Shaune Pollack, MD  Blood Glucose Monitoring Suppl (ACCU-CHEK AVIVA PLUS) w/Device KIT See admin instructions. 10/20/17   [provider]  busPIRone (BUSPAR) 30 MG tablet Take 30 mg by mouth 2 (two) times daily. PER PT STATES HER DOSE WAS INCREASED 02/14/20   [provider]  Cholecalciferol (VITAMIN D3) 50 MCG (2000 UT) capsule Take 1 capsule (2,000 Units total) by mouth daily. Take 1 capsule (2,000 Units total) by mouth daily. 06/23/20 08/13/22  Delano Metz, MD  clonazePAM (KLONOPIN) 0.5 MG tablet Take 0.5 mg by mouth at bedtime as needed. 03/12/20   [provider]  diltiazem (CARDIZEM CD) 120 MG 24 hr capsule TAKE 1 CAPSULE BY MOUTH EVERY DAY 10/27/21   Antonieta Iba, MD  diltiazem (CARDIZEM) 30 MG tablet TAKE 1 TABLET(30 MG) BY MOUTH THREE TIMES DAILY AS NEEDED 12/03/22   Antonieta Iba, MD  DULoxetine (CYMBALTA) 60 MG capsule Take 60 mg by mouth 2 (two)  times daily.     [provider]  fluconazole (DIFLUCAN) 150 MG tablet Take 150 mg by mouth once. 07/23/21   [provider]  Fluticasone-Salmeterol (ADVAIR) 250-50 MCG/DOSE AEPB Inhale 1 puff into the lungs 2 (two) times daily.    [provider]  furosemide (LASIX) 40 MG tablet TAKE 1 TABLET BY MOUTH EVERY DAY. MAY TAKE AN ADDITIONAL TABLET IN THE AM FOR WEIGHT GAIN AS NEEDED 10/27/21   Antonieta Iba, MD  JARDIANCE 25 MG TABS tablet Take 25 mg by mouth in the morning and at bedtime. 08/18/21   [provider]  Magnesium 500 MG CAPS once daily. 11/08/16   [provider]  metFORMIN (GLUCOPHAGE-XR) 500 MG 24 hr tablet Take 1,000 mg by mouth 2 (two) times daily.  10/21/22   [provider]  naloxone [facility]) nasal spray 4 mg/0.1 mL Place 1 spray into the nose as needed for up to 365 doses (for opioid-induced respiratory depresssion). In case of emergency (overdose), spray once into each nostril. If no response within 3 minutes, repeat application and call 911. 04/22/22 04/22/23  Delano Metz, MD  OXYGEN Inhale 2 L into the lungs at bedtime.     [provider]  pantoprazole (PROTONIX) 40 MG tablet Take 1 tablet (40 mg total) by mouth daily. 05/04/18   Darci Current, MD  potassium chloride SA (KLOR-CON) 20 MEQ tablet Take 20 mEq by mouth 2 (two) times daily. 02/15/20   [provider]  pregabalin (LYRICA) 150 MG capsule Take 1 capsule (150 mg total) by mouth 3 (three) times daily. 06/23/20 08/13/22  Delano Metz, MD  rosuvastatin (CRESTOR) 20 MG tablet Take 20 mg by mouth daily. 08/18/21   [provider]  spironolactone (ALDACTONE) 25 MG tablet Take 1 tablet (25 mg total) by mouth every morning. 10/27/21   Antonieta Iba, MD  sucralfate (CARAFATE) 1 G tablet Take 1 tablet (1 g total) by mouth 4 (four) times daily -  with meals and at bedtime. Patient taking differently: Take 1 g by mouth 2 (two) times daily. 06/23/15   Shaune Pollack, MD  tiotropium (SPIRIVA) 18 MCG inhalation capsule Place 18 mcg into inhaler and inhale daily.    [provider]  tiZANidine (ZANAFLEX) 4 MG tablet Take 4 mg by mouth 3 (three) times daily as needed. 09/14/21   [provider]  zolpidem (AMBIEN) 10 MG tablet Take 10 mg by mouth at bedtime as needed. 12/01/22   [provider]    Family History  Problem Relation Age of Onset   Lung cancer Father    Breast cancer Mother        early 65's   Diabetes Maternal Grandmother      Social History   Tobacco Use   Smoking status: Every Day    Packs/day: 1.00    Years: 36.00    Additional pack years: 0.00    Total pack years: 36.00    Types: Cigarettes    Last  attempt to quit: 06/13/2015    Years since quitting: 7.4   Smokeless tobacco: Never  Vaping Use   Vaping Use: Never used  Substance Use Topics   Alcohol use: Yes    Comment: occasionally   Drug use: No    Allergies as of 12/09/2022 - Review Complete 08/24/2022  Allergen Reaction Noted   Tramadol Other (See Comments) and Palpitations 02/09/2013   Penicillins Rash 06/13/2015    Review of Systems:    All systems  reviewed and negative except where noted in HPI.   Physical Exam:  LMP 03/13/2015 (Approximate) Comment: more than 2 years ago Patient's last menstrual period was 03/13/2015 (approximate). Psych:  Alert and cooperative. Normal mood and affect. General:   Alert,  Well-developed, well-nourished, pleasant and cooperative in NAD Head:  Normocephalic and atraumatic. Eyes:  Sclera clear, no icterus.   Conjunctiva pink. Ears:  Normal auditory acuity. Neck:  Supple; no masses or thyromegaly.  Neurologic:  Alert and oriented x3;  grossly normal neurologically. Psych:  Alert and cooperative. Normal mood and affect.  Imaging Studies: No results found.  Assessment and Plan:   Erica [name] is a 58 y.o. y/o female has been referred for rectal bleeding .  Change in bowel habits never had a colonoscopy smoker   Plan  Colonoscopy ASAP CBC    I have discussed alternative options, risks & benefits,  which include, but are not limited to, bleeding, infection, perforation,respiratory complication & drug reaction.  The patient agrees with this plan & written consent will be obtained.    Follow up in 8 to 12 weeks  Dr Wyline Mood MD,MRCP(U.K)

## 2022-12-10 LAB — CBC WITH DIFFERENTIAL/PLATELET
Basophils Absolute: 0.1 10*3/uL (ref 0.0–0.2)
Basos: 1 %
EOS (ABSOLUTE): 0.5 10*3/uL — ABNORMAL HIGH (ref 0.0–0.4)
Eos: 6 %
Hematocrit: 41.4 % (ref 34.0–46.6)
Hemoglobin: 13.9 g/dL (ref 11.1–15.9)
Immature Grans (Abs): 0 10*3/uL (ref 0.0–0.1)
Immature Granulocytes: 0 %
Lymphocytes Absolute: 2.4 10*3/uL (ref 0.7–3.1)
Lymphs: 25 %
MCH: 30.7 pg (ref 26.6–33.0)
MCHC: 33.6 g/dL (ref 31.5–35.7)
MCV: 91 fL (ref 79–97)
Monocytes Absolute: 0.8 10*3/uL (ref 0.1–0.9)
Monocytes: 8 %
Neutrophils Absolute: 5.6 10*3/uL (ref 1.4–7.0)
Neutrophils: 60 %
Platelets: 288 10*3/uL (ref 150–450)
RBC: 4.53 x10E6/uL (ref 3.77–5.28)
RDW: 14.1 % (ref 11.7–15.4)
WBC: 9.4 10*3/uL (ref 3.4–10.8)

## 2022-12-14 ENCOUNTER — Telehealth: Payer: Self-pay

## 2022-12-14 ENCOUNTER — Encounter: Payer: Self-pay | Admitting: Gastroenterology

## 2022-12-14 NOTE — Telephone Encounter (Signed)
Patient was confirming that her procedure was tomorrow since she lives with her daughter now in [place]. Informed her yes and she will call today between 1 and 3 to find out the time

## 2022-12-15 ENCOUNTER — Ambulatory Visit: Payer: Medicaid Other | Admitting: Certified Registered"

## 2022-12-15 ENCOUNTER — Encounter: Payer: Self-pay | Admitting: Gastroenterology

## 2022-12-15 ENCOUNTER — Encounter
Admission: RE | Disposition: A | Discharge status: Home / Self Care | Payer: Self-pay | Source: Home / Self Care | Attending: Gastroenterology

## 2022-12-15 ENCOUNTER — Ambulatory Visit
Admission: RE | Admit: 2022-12-15 | Discharge: 2022-12-15 | Disposition: A | Discharge status: Home / Self Care | Payer: Medicaid Other | Attending: Gastroenterology | Admitting: Gastroenterology

## 2022-12-15 ENCOUNTER — Other Ambulatory Visit: Payer: Self-pay

## 2022-12-15 ENCOUNTER — Telehealth: Payer: Self-pay

## 2022-12-15 DIAGNOSIS — F32A Depression, unspecified: Secondary | ICD-10-CM | POA: Insufficient documentation

## 2022-12-15 DIAGNOSIS — F17210 Nicotine dependence, cigarettes, uncomplicated: Secondary | ICD-10-CM | POA: Diagnosis not present

## 2022-12-15 DIAGNOSIS — F419 Anxiety disorder, unspecified: Secondary | ICD-10-CM | POA: Insufficient documentation

## 2022-12-15 DIAGNOSIS — C20 Malignant neoplasm of rectum: Secondary | ICD-10-CM | POA: Diagnosis not present

## 2022-12-15 DIAGNOSIS — K625 Hemorrhage of anus and rectum: Secondary | ICD-10-CM

## 2022-12-15 DIAGNOSIS — E119 Type 2 diabetes mellitus without complications: Secondary | ICD-10-CM | POA: Insufficient documentation

## 2022-12-15 DIAGNOSIS — I5032 Chronic diastolic (congestive) heart failure: Secondary | ICD-10-CM | POA: Insufficient documentation

## 2022-12-15 DIAGNOSIS — K5669 Other partial intestinal obstruction: Secondary | ICD-10-CM | POA: Diagnosis not present

## 2022-12-15 DIAGNOSIS — I110 Hypertensive heart disease with heart failure: Secondary | ICD-10-CM | POA: Diagnosis not present

## 2022-12-15 DIAGNOSIS — Z8711 Personal history of peptic ulcer disease: Secondary | ICD-10-CM | POA: Diagnosis not present

## 2022-12-15 HISTORY — PX: COLONOSCOPY WITH PROPOFOL: SHX5780

## 2022-12-15 SURGERY — COLONOSCOPY WITH PROPOFOL
Anesthesia: General

## 2022-12-15 MED ORDER — SODIUM CHLORIDE 0.9 % IV SOLN: INTRAVENOUS | Status: DC

## 2022-12-15 MED ORDER — PEG 3350-KCL-NA BICARB-NACL 420 G PO SOLR: 4000.0000 mL | Freq: Once | ORAL | 0 refills | Status: AC

## 2022-12-15 MED ORDER — LIDOCAINE HCL (CARDIAC) PF 100 MG/5ML IV SOSY
PREFILLED_SYRINGE | INTRAVENOUS | Status: DC | PRN
  Administered 2022-12-15: 100 mg via INTRAVENOUS

## 2022-12-15 MED ORDER — PROPOFOL 500 MG/50ML IV EMUL
INTRAVENOUS | Status: DC | PRN
  Administered 2022-12-15: 150 ug/kg/min via INTRAVENOUS
  Administered 2022-12-15: 50 mg via INTRAVENOUS

## 2022-12-15 NOTE — Anesthesia Preprocedure Evaluation (Signed)
Anesthesia Evaluation  Patient identified by MRN, date of birth, ID band Patient awake    Reviewed: Allergy & Precautions, H&P , NPO status , Patient's Chart, lab work & pertinent test results, reviewed documented beta blocker date and time   Airway Mallampati: II   Neck ROM: full    Dental  (+) Poor Dentition   Pulmonary shortness of breath and with exertion, asthma , sleep apnea and Continuous Positive Airway Pressure Ventilation , COPD,  COPD inhaler and oxygen dependent, Current Smoker   Pulmonary exam normal        Cardiovascular Exercise Tolerance: Poor hypertension, On Medications +CHF  Normal cardiovascular exam Rhythm:regular Rate:Normal     Neuro/Psych  PSYCHIATRIC DISORDERS Anxiety Depression     Neuromuscular disease    GI/Hepatic Neg liver ROS, PUD,,,  Endo/Other  negative endocrine ROSdiabetes    Renal/GU Renal disease  negative genitourinary   Musculoskeletal   Abdominal   Peds  Hematology negative hematology ROS (+)   Anesthesia Other Findings Past Medical History: No date: (HFpEF) heart failure with preserved ejection fraction (HCC)     Comment:  a. 05/2015 Echo: EF 60-65%, no rwma, PASP . 08/13/2018: Acute pancreatitis No date: Arthritis No date: Asthma No date: Bell's palsy No date: Bronchitis No date: CHF (congestive heart failure) (HCC) No date: COPD (chronic obstructive pulmonary disease) (HCC) No date: Diabetes mellitus without complication (HCC)     Comment:  type II 03/2017 No date: Gastric ulcer No date: Hyperlipidemia No date: Hypertension No date: OSA (obstructive sleep apnea)     Comment:  a. did not tolerate CPAP. No date: Pancreatitis     Comment:  07/2018 No date: Shoulder injury     Comment:  6/19 Past Surgical History: 06/20/2015: FLEXIBLE BRONCHOSCOPY; N/A     Comment:  Procedure: FLEXIBLE BRONCHOSCOPY;  Surgeon: Shane Crutch, MD;  Location:  [facility] ORS;  Service:               Pulmonary;  Laterality: N/A; No date: KNEE SURGERY; Left     Comment:  8 knee surgeries BMI    Body Mass Index: 24.42 kg/m     Reproductive/Obstetrics negative OB ROS                             Anesthesia Physical Anesthesia Plan  ASA: 3  Anesthesia Plan: General   Post-op Pain Management:    Induction:   PONV Risk Score and Plan:   Airway Management Planned:   Additional Equipment:   Intra-op Plan:   Post-operative Plan:   Informed Consent: I have reviewed the patients History and Physical, chart, labs and discussed the procedure including the risks, benefits and alternatives for the proposed anesthesia with the patient or authorized representative who has indicated his/her understanding and acceptance.     Dental Advisory Given  Plan Discussed with: CRNA  Anesthesia Plan Comments:        Anesthesia Quick Evaluation

## 2022-12-15 NOTE — Telephone Encounter (Signed)
Per Dr. Johnney Killian advice patient has been contacted to provide instructions on bowel prep.  She has been advised that a rx for Nulytely prep has been sent to [facility].  And she should continue with her clear liquid, and start Nulytely prep at 5pm.  Mixing it with clear liquids-drinking 8 oz every 15-30 mins until she has completed entire contents.  Advised should she experience any nausea she can drink every 30 mins.  Nothing to eat or drink 4 hours before colonoscopy. Pt verbalized understanding.  Thanks, [place], [place]

## 2022-12-15 NOTE — H&P (Signed)
Wyline Mood, MD [address], [address], [place], [place], [address] [address], [address], [place], [place], [address] Phone: [phone]  Fax: [phone]  Primary Care Physician:  Hillery Aldo, MD   Pre-Procedure History & Physical: HPI:  Erica [name] is a 57 y.o. female is here for an colonoscopy.   Past Medical History:  Diagnosis Date   (HFpEF) heart failure with preserved ejection fraction (HCC)    a. 05/2015 Echo: EF 60-65%, no rwma, PASP .   Acute pancreatitis 08/13/2018   Arthritis    Asthma    Bell's palsy    Bronchitis    CHF (congestive heart failure) (HCC)    COPD (chronic obstructive pulmonary disease) (HCC)    Diabetes mellitus without complication (HCC)    type II 03/2017   Gastric ulcer    Hyperlipidemia    Hypertension    OSA (obstructive sleep apnea)    a. did not tolerate CPAP.   Pancreatitis    07/2018   Shoulder injury    6/19    Past Surgical History:  Procedure Laterality Date   FLEXIBLE BRONCHOSCOPY N/A 06/20/2015   Procedure: FLEXIBLE BRONCHOSCOPY;  Surgeon: Shane Crutch, MD;  Location: [facility] ORS;  Service: Pulmonary;  Laterality: N/A;   KNEE SURGERY Left    8 knee surgeries    Prior to Admission medications   Medication Sig Start Date End Date Taking? Authorizing Provider  aspirin EC 81 MG EC tablet Take 1 tablet (81 mg total) by mouth daily. 06/23/15  Yes Shaune Pollack, MD  busPIRone (BUSPAR) 30 MG tablet Take 30 mg by mouth 2 (two) times daily. PER PT STATES HER DOSE WAS INCREASED 02/14/20  Yes [provider]  diltiazem (CARDIZEM CD) 120 MG 24 hr capsule TAKE 1 CAPSULE BY MOUTH EVERY DAY 10/27/21  Yes Gollan, Tollie Pizza, MD  DULoxetine (CYMBALTA) 60 MG capsule Take 60 mg by mouth 2 (two) times daily.    Yes [provider]  Fluticasone-Salmeterol (ADVAIR) 250-50 MCG/DOSE AEPB Inhale 1 puff into the lungs 2 (two) times daily.   Yes [provider]  Magnesium 500 MG CAPS once daily. 11/08/16  Yes  [provider]  albuterol (VENTOLIN HFA) 108 (90 Base) MCG/ACT inhaler Inhale 2 puffs into the lungs every 6 (six) hours as needed for wheezing or shortness of breath.     [provider]  Blood Glucose Monitoring Suppl (ACCU-CHEK AVIVA PLUS) w/Device KIT See admin instructions. 10/20/17   [provider]  Cholecalciferol (VITAMIN D3) 50 MCG (2000 UT) capsule Take 1 capsule (2,000 Units total) by mouth daily. Take 1 capsule (2,000 Units total) by mouth daily. 06/23/20 12/09/22  Delano Metz, MD  clonazePAM (KLONOPIN) 0.5 MG tablet Take 0.5 mg by mouth at bedtime as needed. 03/12/20   [provider]  diltiazem (CARDIZEM) 30 MG tablet TAKE 1 TABLET(30 MG) BY MOUTH THREE TIMES DAILY AS NEEDED 12/03/22   Antonieta Iba, MD  fluconazole (DIFLUCAN) 150 MG tablet Take 150 mg by mouth once. 07/23/21   [provider]  furosemide (LASIX) 40 MG tablet TAKE 1 TABLET BY MOUTH EVERY DAY. MAY TAKE AN ADDITIONAL TABLET IN THE AM FOR WEIGHT GAIN AS NEEDED 10/27/21   Antonieta Iba, MD  JARDIANCE 25 MG TABS tablet Take 25 mg by mouth in the morning and at bedtime. 08/18/21   [provider]  metFORMIN (GLUCOPHAGE-XR) 500 MG 24 hr tablet Take 1,000 mg by mouth 2 (two) times daily. 10/21/22  [provider]  naloxone [facility]) nasal spray 4 mg/0.1 mL Place 1 spray into the nose as needed for up to 365 doses (for opioid-induced respiratory depresssion). In case of emergency (overdose), spray once into each nostril. If no response within 3 minutes, repeat application and call 911. 04/22/22 04/22/23  Delano Metz, MD  OXYGEN Inhale 2 L into the lungs at bedtime.     [provider]  pantoprazole (PROTONIX) 40 MG tablet Take 1 tablet (40 mg total) by mouth daily. 05/04/18   Darci Current, MD  potassium chloride SA (KLOR-CON) 20 MEQ tablet Take 20 mEq by mouth 2 (two) times daily. 02/15/20   [provider]  pregabalin (LYRICA) 150 MG  capsule Take 1 capsule (150 mg total) by mouth 3 (three) times daily. 06/23/20 12/09/22  Delano Metz, MD  rosuvastatin (CRESTOR) 20 MG tablet Take 20 mg by mouth daily. 08/18/21   [provider]  spironolactone (ALDACTONE) 25 MG tablet Take 1 tablet (25 mg total) by mouth every morning. 10/27/21   Antonieta Iba, MD  sucralfate (CARAFATE) 1 G tablet Take 1 tablet (1 g total) by mouth 4 (four) times daily -  with meals and at bedtime. Patient taking differently: Take 1 g by mouth 2 (two) times daily. 06/23/15   Shaune Pollack, MD  tiotropium (SPIRIVA) 18 MCG inhalation capsule Place 18 mcg into inhaler and inhale daily.    [provider]  tiZANidine (ZANAFLEX) 4 MG tablet Take 4 mg by mouth 3 (three) times daily as needed. 09/14/21   [provider]  zolpidem (AMBIEN) 10 MG tablet Take 10 mg by mouth at bedtime as needed. 12/01/22   [provider]    Allergies as of 12/09/2022 - Review Complete 12/09/2022  Allergen Reaction Noted   Tramadol Other (See Comments) and Palpitations 02/09/2013   Penicillins Rash 06/13/2015    Family History  Problem Relation Age of Onset   Lung cancer Father    Breast cancer Mother        early 28's   Diabetes Maternal Grandmother     Social History   Socioeconomic History   Marital status: Widowed    Spouse name: Not on file   Number of children: Not on file   Years of education: Not on file   Highest education level: Not on file  Occupational History   Occupation: unemployed  Tobacco Use   Smoking status: Every Day    Packs/day: 1.00    Years: 36.00    Additional pack years: 0.00    Total pack years: 36.00    Types: Cigarettes    Last attempt to quit: 06/13/2015    Years since quitting: 7.5   Smokeless tobacco: Never  Vaping Use   Vaping Use: Never used  Substance and Sexual Activity   Alcohol use: Yes    Comment: occasionally   Drug use: No   Sexual activity: Not Currently  Other Topics Concern    Not on file  Social History Narrative   Not on file   Social Determinants of Health   Financial Resource Strain: High Risk (07/18/2017)   Overall Financial Resource Strain (CARDIA)    Difficulty of Paying Living Expenses: Very hard  Food Insecurity: Food Insecurity Present (07/18/2017)   Hunger Vital Sign    Worried About Running Out of Food in the Last Year: Often true    Ran Out of Food in the Last Year: Often true  Transportation Needs: Unmet Transportation Needs (07/18/2017)  PRAPARE - Administrator, Civil Service (Medical): Yes    Lack of Transportation (Non-Medical): Yes  Physical Activity: Insufficiently Active (07/18/2017)   Exercise Vital Sign    Days of Exercise per Week: 5 days    Minutes of Exercise per Session: 20 min  Stress: Stress Concern Present (07/18/2017)   [employer] of Occupational Health - Occupational Stress Questionnaire    Feeling of Stress : Very much  Social Connections: Socially Isolated (07/18/2017)   Social Connection and Isolation Panel [NHANES]    Frequency of Communication with Friends and Family: Twice a week    Frequency of Social Gatherings with Friends and Family: Never    Attends Religious Services: Never    Database administrator or Organizations: No    Attends Banker Meetings: Never    Marital Status: Divorced  Catering manager Violence: Not At Risk (07/18/2017)   Humiliation, Afraid, Rape, and Kick questionnaire    Fear of Current or Ex-Partner: No    Emotionally Abused: No    Physically Abused: No    Sexually Abused: No    Review of Systems: See HPI, otherwise negative ROS  Physical Exam: BP 118/81   Pulse 85   Temp (!) 96.7 F (35.9 C) (Temporal)   Resp 18   Wt 72.8 kg   LMP 03/13/2015 (Approximate) Comment: more than 2 years ago  SpO2 100%   BMI 24.42 kg/m  General:   Alert,  pleasant and cooperative in NAD Head:  Normocephalic and atraumatic. Neck:  Supple; no masses or  thyromegaly. Lungs:  Clear throughout to auscultation, normal respiratory effort.    Heart:  +S1, +S2, Regular rate and rhythm, No edema. Abdomen:  Soft, nontender and nondistended. Normal bowel sounds, without guarding, and without rebound.   Neurologic:  Alert and  oriented x4;  grossly normal neurologically.  Impression/Plan: Erica [name] is here for an colonoscopy to be performed for rectal bleeding.  Risks, benefits, limitations, and alternatives regarding  colonoscopy have been reviewed with the patient.  Questions have been answered.  All parties agreeable.   Wyline Mood, MD  12/15/2022, 8:24 AM

## 2022-12-15 NOTE — Transfer of Care (Signed)
Immediate Anesthesia Transfer of Care Note  Patient: Erica [name]  Procedure(s) Performed: COLONOSCOPY WITH PROPOFOL  Patient Location: Endoscopy Unit  Anesthesia Type:General  Level of Consciousness: drowsy  Airway & Oxygen Therapy: Patient Spontanous Breathing  Post-op Assessment: Report given to RN  Post vital signs: stable  Last Vitals:  Vitals Value Taken Time  BP    Temp    Pulse    Resp    SpO2      Last Pain:  Vitals:   12/15/22 0809  TempSrc: Temporal         Complications: No notable events documented.

## 2022-12-15 NOTE — Op Note (Signed)
[facility] Patient Name: Erica [name] Procedure Date: 12/15/2022 8:42 AM MRN: [id number] Account #: [id number] Date of Birth: 29-Oct-1965 Admit Type: Outpatient Age: 57 Room: [facility] ENDO ROOM 3 Gender: Female Note Status: Finalized Instrument Name: Prentice Docker [id number] Procedure:             Colonoscopy Indications:           Rectal bleeding Providers:             Wyline Mood MD, MD Medicines:             Monitored Anesthesia Care Complications:         No immediate complications. Procedure:             Pre-Anesthesia Assessment:                        - Prior to the procedure, a History and Physical was                         performed, and patient medications, allergies and                         sensitivities were reviewed. The patient's tolerance                         of previous anesthesia was reviewed.                        - The risks and benefits of the procedure and the                         sedation options and risks were discussed with the                         patient. All questions were answered and informed                         consent was obtained.                        - ASA Grade Assessment: II - A patient with mild                         systemic disease.                        After obtaining informed consent, the colonoscope was                         passed under direct vision. Throughout the procedure,                         the patient's blood pressure, pulse, and oxygen                         saturations were monitored continuously. The                         Colonoscope was introduced through the anus and  advanced to the the cecum, identified by the                         appendiceal orifice. The colonoscopy was performed                         with ease. The patient tolerated the procedure well.                         The quality of the bowel preparation was                          unsatisfactory. Findings:      Extensive amounts of semi-liquid stool was found in the entire colon,       interfering with visualization.      A fungating partially obstructing large mass was found in the rectum.       The mass was circumferential. The mass measured eight cm in length. In       addition, its diameter measured thirteen mm. No bleeding was present.       Mucosa was biopsied with a cold forceps for histology. One specimen       bottle was sent to pathology. Mass extended from about 15 cm from anus       to about 8 cm from anal verge Impression:            - Preparation of the colon was unsatisfactory.                        - Stool in the entire examined colon.                        - Rule out malignancy, partially obstructing tumor in                         the rectum. Biopsied. Recommendation:        - Discharge patient to home (with escort).                        - Resume previous diet.                        - Continue present medications.                        - Await pathology results.                        - Repeat colonoscopy tomorrow because the bowel                         preparation was suboptimal. Procedure Code(s):     --- Professional ---                        [phone], Colonoscopy, flexible; diagnostic, including                         collection of specimen(s) by brushing or washing, when  performed (separate procedure) Diagnosis Code(s):     --- Professional ---                        D49.0, Neoplasm of unspecified behavior of digestive                         system                        K56.690, Other partial intestinal obstruction                        K62.5, Hemorrhage of anus and rectum CPT copyright 2022 American Medical Association. All rights reserved. The codes documented in this report are preliminary and upon coder review may  be revised to meet current compliance requirements. Wyline Mood, MD Wyline Mood MD,  MD 12/15/2022 9:04:20 AM This report has been signed electronically. Number of Addenda: 0 Note Initiated On: 12/15/2022 8:42 AM Scope Withdrawal Time: 0 hours 2 minutes 58 seconds  Total Procedure Duration: 0 hours 7 minutes 59 seconds  Estimated Blood Loss:  Estimated blood loss: none.      [facility]

## 2022-12-15 NOTE — Anesthesia Postprocedure Evaluation (Signed)
Anesthesia Post Note  Patient: Erica [name]  Procedure(s) Performed: COLONOSCOPY WITH PROPOFOL  Patient location during evaluation: PACU Anesthesia Type: General Level of consciousness: awake and alert Pain management: pain level controlled Vital Signs Assessment: post-procedure vital signs reviewed and stable Respiratory status: spontaneous breathing, nonlabored ventilation, respiratory function stable and patient connected to nasal cannula oxygen Cardiovascular status: blood pressure returned to baseline and stable Postop Assessment: no apparent nausea or vomiting Anesthetic complications: no   No notable events documented.   Last Vitals:  Vitals:   12/15/22 0809 12/15/22 0906  BP: 118/81 112/65  Pulse: 85   Resp: 18   Temp: (!) 35.9 C (!) 36.1 C  SpO2: 100%     Last Pain:  Vitals:   12/15/22 0906  TempSrc: Temporal  PainSc: Asleep                 Yevette Edwards

## 2022-12-16 ENCOUNTER — Ambulatory Visit: Payer: Medicaid Other | Admitting: Registered Nurse

## 2022-12-16 ENCOUNTER — Ambulatory Visit
Admission: RE | Admit: 2022-12-16 | Discharge: 2022-12-16 | Disposition: A | Discharge status: Home / Self Care | Payer: Medicaid Other | Attending: Gastroenterology | Admitting: Gastroenterology

## 2022-12-16 ENCOUNTER — Encounter
Admission: RE | Disposition: A | Discharge status: Home / Self Care | Payer: Self-pay | Source: Home / Self Care | Attending: Gastroenterology

## 2022-12-16 ENCOUNTER — Encounter: Payer: Self-pay | Admitting: Gastroenterology

## 2022-12-16 DIAGNOSIS — R0602 Shortness of breath: Secondary | ICD-10-CM | POA: Insufficient documentation

## 2022-12-16 DIAGNOSIS — E785 Hyperlipidemia, unspecified: Secondary | ICD-10-CM | POA: Diagnosis not present

## 2022-12-16 DIAGNOSIS — E119 Type 2 diabetes mellitus without complications: Secondary | ICD-10-CM | POA: Diagnosis not present

## 2022-12-16 DIAGNOSIS — F17210 Nicotine dependence, cigarettes, uncomplicated: Secondary | ICD-10-CM | POA: Diagnosis not present

## 2022-12-16 DIAGNOSIS — F419 Anxiety disorder, unspecified: Secondary | ICD-10-CM | POA: Diagnosis not present

## 2022-12-16 DIAGNOSIS — I110 Hypertensive heart disease with heart failure: Secondary | ICD-10-CM | POA: Insufficient documentation

## 2022-12-16 DIAGNOSIS — F32A Depression, unspecified: Secondary | ICD-10-CM | POA: Insufficient documentation

## 2022-12-16 DIAGNOSIS — D490 Neoplasm of unspecified behavior of digestive system: Secondary | ICD-10-CM | POA: Insufficient documentation

## 2022-12-16 DIAGNOSIS — K5669 Other partial intestinal obstruction: Secondary | ICD-10-CM | POA: Diagnosis not present

## 2022-12-16 DIAGNOSIS — I5032 Chronic diastolic (congestive) heart failure: Secondary | ICD-10-CM | POA: Diagnosis not present

## 2022-12-16 DIAGNOSIS — Z9981 Dependence on supplemental oxygen: Secondary | ICD-10-CM | POA: Insufficient documentation

## 2022-12-16 DIAGNOSIS — J449 Chronic obstructive pulmonary disease, unspecified: Secondary | ICD-10-CM | POA: Insufficient documentation

## 2022-12-16 DIAGNOSIS — K625 Hemorrhage of anus and rectum: Secondary | ICD-10-CM | POA: Diagnosis present

## 2022-12-16 DIAGNOSIS — I509 Heart failure, unspecified: Secondary | ICD-10-CM | POA: Diagnosis not present

## 2022-12-16 DIAGNOSIS — G4733 Obstructive sleep apnea (adult) (pediatric): Secondary | ICD-10-CM | POA: Diagnosis not present

## 2022-12-16 DIAGNOSIS — K279 Peptic ulcer, site unspecified, unspecified as acute or chronic, without hemorrhage or perforation: Secondary | ICD-10-CM | POA: Insufficient documentation

## 2022-12-16 HISTORY — PX: COLONOSCOPY WITH PROPOFOL: SHX5780

## 2022-12-16 SURGERY — COLONOSCOPY WITH PROPOFOL
Anesthesia: General

## 2022-12-16 MED ORDER — PROPOFOL 500 MG/50ML IV EMUL
INTRAVENOUS | Status: DC | PRN
  Administered 2022-12-16: 50 mg via INTRAVENOUS
  Administered 2022-12-16: 150 ug/kg/min via INTRAVENOUS
  Administered 2022-12-16: 50 mg via INTRAVENOUS

## 2022-12-16 MED ORDER — PROPOFOL 1000 MG/100ML IV EMUL
INTRAVENOUS | Status: AC
  Filled 2022-12-16: qty 100

## 2022-12-16 MED ORDER — PROPOFOL 10 MG/ML IV BOLUS
INTRAVENOUS | Status: AC
  Filled 2022-12-16: qty 20

## 2022-12-16 MED ORDER — ONDANSETRON HCL 4 MG/2ML IJ SOLN
INTRAMUSCULAR | Status: DC | PRN
  Administered 2022-12-16: 4 mg via INTRAVENOUS

## 2022-12-16 MED ORDER — SODIUM CHLORIDE 0.9 % IV SOLN: INTRAVENOUS | Status: DC | PRN

## 2022-12-16 MED ORDER — SODIUM CHLORIDE 0.9 % IV SOLN: INTRAVENOUS | Status: DC

## 2022-12-16 NOTE — Op Note (Signed)
[facility] Gastroenterology Patient Name: Erica [name] Procedure Date: 12/16/2022 10:17 AM MRN: [id number] Account #: [id number] Date of Birth: 30-Oct-1965 Admit Type: Outpatient Age: 57 Room: [facility] ENDO ROOM 4 Gender: Female Note Status: Finalized Instrument Name: Peds Colonoscope [id number] Procedure:             Colonoscopy Indications:           Rectal bleeding Providers:             Wyline Mood MD, MD Referring MD:          Hillery Aldo MD, MD (Referring MD) Medicines:             Monitored Anesthesia Care Complications:         No immediate complications. Procedure:             Pre-Anesthesia Assessment:                        - Prior to the procedure, a History and Physical was                         performed, and patient medications, allergies and                         sensitivities were reviewed. The patient's tolerance                         of previous anesthesia was reviewed.                        - The risks and benefits of the procedure and the                         sedation options and risks were discussed with the                         patient. All questions were answered and informed                         consent was obtained.                        - ASA Grade Assessment: II - A patient with mild                         systemic disease.                        After obtaining informed consent, the colonoscope was                         passed under direct vision. Throughout the procedure,                         the patient's blood pressure, pulse, and oxygen                         saturations were monitored continuously. The                         Colonoscope was introduced through  the anus and                         advanced to the the cecum, identified by the                         appendiceal orifice. The colonoscopy was performed                         with ease. The patient tolerated the procedure well.                          The quality of the bowel preparation was inadequate. Findings:      The perianal and digital rectal examinations were normal.      Extensive amounts of liquid semi-liquid semi-solid stool was found in       the entire colon, interfering with visualization. Lavage of the area was       performed using greater than 500 mL, resulting in incomplete clearance       with continued poor visualization.      A partially obstructing large mass was found in the rectum. The mass was       circumferential. Impression:            - Preparation of the colon was inadequate.                        - Stool in the entire examined colon.                        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                        - No specimens collected. Recommendation:        - Discharge patient to home (with escort).                        - Resume previous diet.                        - Continue present medications.                        - Visualization was inadequate to safely rule out any                         masses in the remaining part of the colon . She will                         need a 2-3 day prep to clean out to get a good look                         into the colon and needs to be rescheduled Procedure Code(s):     --- Professional ---                        [phone], Colonoscopy, flexible; diagnostic, including                         collection  of specimen(s) by brushing or washing, when                         performed (separate procedure) Diagnosis Code(s):     --- Professional ---                        D49.0, Neoplasm of unspecified behavior of digestive                         system                        K56.690, Other partial intestinal obstruction                        K62.5, Hemorrhage of anus and rectum CPT copyright 2022 American Medical Association. All rights reserved. The codes documented in this report are preliminary and upon coder review may  be revised to meet current  compliance requirements. Wyline Mood, MD Wyline Mood MD, MD 12/16/2022 11:15:20 AM This report has been signed electronically. Number of Addenda: 0 Note Initiated On: 12/16/2022 10:17 AM Scope Withdrawal Time: 0 hours 5 minutes 31 seconds  Total Procedure Duration: 0 hours 15 minutes 42 seconds  Estimated Blood Loss:  Estimated blood loss: none.      [facility]

## 2022-12-16 NOTE — Transfer of Care (Signed)
Immediate Anesthesia Transfer of Care Note  Patient: Erica [name]  Procedure(s) Performed: COLONOSCOPY WITH PROPOFOL  Patient Location: PACU  Anesthesia Type:MAC  Level of Consciousness: drowsy  Airway & Oxygen Therapy: Patient Spontanous Breathing  Post-op Assessment: Report given to RN and Post -op Vital signs reviewed and stable  Post vital signs: Reviewed  Last Vitals:  Vitals Value Taken Time  BP 93/73 12/16/22 1123  Temp 20F   Pulse 86 12/16/22 1123  Resp 16 12/16/22 1123  SpO2 100 % 12/16/22 1123  Vitals shown include unvalidated device data.  Last Pain:  Vitals:   12/16/22 1044  TempSrc: Temporal  PainSc: 0-No pain         Complications: No notable events documented.

## 2022-12-16 NOTE — Anesthesia Preprocedure Evaluation (Signed)
Anesthesia Evaluation  Patient identified by MRN, date of birth, ID band Patient awake    Reviewed: Allergy & Precautions, H&P , NPO status , Patient's Chart, lab work & pertinent test results, reviewed documented beta blocker date and time   History of Anesthesia Complications Negative for: history of anesthetic complications  Airway Mallampati: II  TM Distance: <3 FB Neck ROM: full    Dental  (+) Edentulous Upper, Edentulous Lower   Pulmonary shortness of breath and with exertion, asthma , sleep apnea and Continuous Positive Airway Pressure Ventilation , COPD,  COPD inhaler and oxygen dependent, Current SmokerPatient did not abstain from smoking.    + decreased breath sounds      Cardiovascular Exercise Tolerance: Poor METShypertension, On Medications +CHF  (-) CAD and (-) Past MI Normal cardiovascular exam(-) dysrhythmias  Rhythm:regular Rate:Normal - Systolic murmurs    Neuro/Psych  PSYCHIATRIC DISORDERS Anxiety Depression     Neuromuscular disease    GI/Hepatic Neg liver ROS, PUD,neg GERD  ,,  Endo/Other  diabetes    Renal/GU Renal disease  negative genitourinary   Musculoskeletal   Abdominal   Peds  Hematology negative hematology ROS (+)   Anesthesia Other Findings Past Medical History: No date: (HFpEF) heart failure with preserved ejection fraction (HCC)     Comment:  a. 05/2015 Echo: EF 60-65%, no rwma, PASP . 08/13/2018: Acute pancreatitis No date: Arthritis No date: Asthma No date: Bell's palsy No date: Bronchitis No date: CHF (congestive heart failure) (HCC) No date: COPD (chronic obstructive pulmonary disease) (HCC) No date: Diabetes mellitus without complication (HCC)     Comment:  type II 03/2017 No date: Gastric ulcer No date: Hyperlipidemia No date: Hypertension No date: OSA (obstructive sleep apnea)     Comment:  a. did not tolerate CPAP. No date: Pancreatitis     Comment:  07/2018 No  date: Shoulder injury     Comment:  6/19 Past Surgical History: 06/20/2015: FLEXIBLE BRONCHOSCOPY; N/A     Comment:  Procedure: FLEXIBLE BRONCHOSCOPY;  Surgeon: Shane Crutch, MD;  Location: [facility] ORS;  Service:               Pulmonary;  Laterality: N/A; No date: KNEE SURGERY; Left     Comment:  8 knee surgeries BMI    Body Mass Index: 24.42 kg/m     Reproductive/Obstetrics negative OB ROS                             Anesthesia Physical Anesthesia Plan  ASA: 3  Anesthesia Plan: General   Post-op Pain Management: Minimal or no pain anticipated   Induction: Intravenous  PONV Risk Score and Plan: 3 and Propofol infusion, TIVA and Ondansetron  Airway Management Planned: Nasal Cannula  Additional Equipment: None  Intra-op Plan:   Post-operative Plan:   Informed Consent: I have reviewed the patients History and Physical, chart, labs and discussed the procedure including the risks, benefits and alternatives for the proposed anesthesia with the patient or authorized representative who has indicated his/her understanding and acceptance.     Dental Advisory Given  Plan Discussed with: CRNA  Anesthesia Plan Comments: (Discussed risks of anesthesia with patient, including possibility of difficulty with spontaneous ventilation under anesthesia necessitating airway intervention, PONV, and rare risks such as cardiac or respiratory or neurological events, and allergic reactions. Discussed the role of CRNA in patient's perioperative  care. Patient understands. Patient counseled on benefits of smoking cessation, and increased perioperative risks associated with continued smoking. )       Anesthesia Quick Evaluation

## 2022-12-16 NOTE — Anesthesia Postprocedure Evaluation (Signed)
Anesthesia Post Note  Patient: Erica [name]  Procedure(s) Performed: COLONOSCOPY WITH PROPOFOL  Patient location during evaluation: Endoscopy Anesthesia Type: General Level of consciousness: awake and alert Pain management: pain level controlled Vital Signs Assessment: post-procedure vital signs reviewed and stable Respiratory status: spontaneous breathing, nonlabored ventilation, respiratory function stable and patient connected to nasal cannula oxygen Cardiovascular status: blood pressure returned to baseline and stable Postop Assessment: no apparent nausea or vomiting Anesthetic complications: no   No notable events documented.   Last Vitals:  Vitals:   12/16/22 1130 12/16/22 1145  BP:  135/75  Pulse: 87 72  Resp: 20 (!) 38  Temp:    SpO2: 100% 100%    Last Pain:  Vitals:   12/16/22 1145  TempSrc:   PainSc: 0-No pain                 Corinda Gubler

## 2022-12-16 NOTE — H&P (Addendum)
Wyline Mood, MD [address], [place], [place], [address] [address], [place], [place], [address] Phone: [phone]  Fax: [phone]  Primary Care Physician:  Hillery Aldo, MD   Pre-Procedure History & Physical: HPI:  Erica [name] is a 57 y.o. female is here for an colonoscopy.   Past Medical History:  Diagnosis Date   (HFpEF) heart failure with preserved ejection fraction (HCC)    a. 05/2015 Echo: EF 60-65%, no rwma, PASP .   Acute pancreatitis 08/13/2018   Arthritis    Asthma    Bell's palsy    Bronchitis    CHF (congestive heart failure) (HCC)    COPD (chronic obstructive pulmonary disease) (HCC)    Diabetes mellitus without complication (HCC)    type II 03/2017   Gastric ulcer    Hyperlipidemia    Hypertension    OSA (obstructive sleep apnea)    a. did not tolerate CPAP.   Pancreatitis    07/2018   Shoulder injury    6/19    Past Surgical History:  Procedure Laterality Date   COLONOSCOPY WITH PROPOFOL N/A 12/15/2022   Procedure: COLONOSCOPY WITH PROPOFOL;  Surgeon: Wyline Mood, MD;  Location: [facility] ENDOSCOPY;  Service: Gastroenterology;  Laterality: N/A;   FLEXIBLE BRONCHOSCOPY N/A 06/20/2015   Procedure: FLEXIBLE BRONCHOSCOPY;  Surgeon: Shane Crutch, MD;  Location: [facility] ORS;  Service: Pulmonary;  Laterality: N/A;   KNEE SURGERY Left    8 knee surgeries    Prior to Admission medications   Medication Sig Start Date End Date Taking? Authorizing Provider  albuterol (VENTOLIN HFA) 108 (90 Base) MCG/ACT inhaler Inhale 2 puffs into the lungs every 6 (six) hours as needed for wheezing or shortness of breath.     [provider]  aspirin EC 81 MG EC tablet Take 1 tablet (81 mg total) by mouth daily. 06/23/15   Shaune Pollack, MD  Blood Glucose Monitoring Suppl (ACCU-CHEK AVIVA PLUS) w/Device KIT See admin instructions. 10/20/17   [provider]  busPIRone (BUSPAR) 30 MG tablet Take 30 mg by mouth 2 (two) times  daily. PER PT STATES HER DOSE WAS INCREASED 02/14/20   [provider]  Cholecalciferol (VITAMIN D3) 50 MCG (2000 UT) capsule Take 1 capsule (2,000 Units total) by mouth daily. Take 1 capsule (2,000 Units total) by mouth daily. 06/23/20 12/09/22  Delano Metz, MD  clonazePAM (KLONOPIN) 0.5 MG tablet Take 0.5 mg by mouth at bedtime as needed. 03/12/20   [provider]  diltiazem (CARDIZEM CD) 120 MG 24 hr capsule TAKE 1 CAPSULE BY MOUTH EVERY DAY 10/27/21   Antonieta Iba, MD  diltiazem (CARDIZEM) 30 MG tablet TAKE 1 TABLET(30 MG) BY MOUTH THREE TIMES DAILY AS NEEDED 12/03/22   Antonieta Iba, MD  DULoxetine (CYMBALTA) 60 MG capsule Take 60 mg by mouth 2 (two) times daily.     [provider]  fluconazole (DIFLUCAN) 150 MG tablet Take 150 mg by mouth once. 07/23/21   [provider]  Fluticasone-Salmeterol (ADVAIR) 250-50 MCG/DOSE AEPB Inhale 1 puff into the lungs 2 (two) times daily.    [provider]  furosemide (LASIX) 40 MG tablet TAKE 1 TABLET BY MOUTH EVERY DAY. MAY TAKE AN ADDITIONAL TABLET IN THE AM FOR WEIGHT GAIN AS NEEDED 10/27/21   Antonieta Iba, MD  JARDIANCE 25 MG TABS tablet Take 25 mg by mouth in the morning and at bedtime. 08/18/21   [provider]  Magnesium 500  MG CAPS once daily. 11/08/16   [provider]  metFORMIN (GLUCOPHAGE-XR) 500 MG 24 hr tablet Take 1,000 mg by mouth 2 (two) times daily. 10/21/22   [provider]  naloxone [facility]) nasal spray 4 mg/0.1 mL Place 1 spray into the nose as needed for up to 365 doses (for opioid-induced respiratory depresssion). In case of emergency (overdose), spray once into each nostril. If no response within 3 minutes, repeat application and call 911. 04/22/22 04/22/23  Delano Metz, MD  OXYGEN Inhale 2 L into the lungs at bedtime.     [provider]  pantoprazole (PROTONIX) 40 MG tablet Take 1 tablet (40 mg total) by mouth daily. 05/04/18   Darci Current, MD  potassium chloride SA (KLOR-CON) 20 MEQ tablet Take 20 mEq by mouth 2 (two) times daily. 02/15/20   [provider]  pregabalin (LYRICA) 150 MG capsule Take 1 capsule (150 mg total) by mouth 3 (three) times daily. 06/23/20 12/09/22  Delano Metz, MD  rosuvastatin (CRESTOR) 20 MG tablet Take 20 mg by mouth daily. 08/18/21   [provider]  spironolactone (ALDACTONE) 25 MG tablet Take 1 tablet (25 mg total) by mouth every morning. 10/27/21   Antonieta Iba, MD  sucralfate (CARAFATE) 1 G tablet Take 1 tablet (1 g total) by mouth 4 (four) times daily -  with meals and at bedtime. Patient taking differently: Take 1 g by mouth 2 (two) times daily. 06/23/15   Shaune Pollack, MD  tiotropium (SPIRIVA) 18 MCG inhalation capsule Place 18 mcg into inhaler and inhale daily.    [provider]  tiZANidine (ZANAFLEX) 4 MG tablet Take 4 mg by mouth 3 (three) times daily as needed. 09/14/21   [provider]  zolpidem (AMBIEN) 10 MG tablet Take 10 mg by mouth at bedtime as needed. 12/01/22   [provider]    Allergies as of 12/15/2022 - Review Complete 12/15/2022  Allergen Reaction Noted   Tramadol Other (See Comments) and Palpitations 02/09/2013   Penicillins Rash 06/13/2015    Family History  Problem Relation Age of Onset   Lung cancer Father    Breast cancer Mother        early 55's   Diabetes Maternal Grandmother     Social History   Socioeconomic History   Marital status: Widowed    Spouse name: Not on file   Number of children: Not on file   Years of education: Not on file   Highest education level: Not on file  Occupational History   Occupation: unemployed  Tobacco Use   Smoking status: Every Day    Packs/day: 1.00    Years: 36.00    Additional pack years: 0.00    Total pack years: 36.00    Types: Cigarettes    Last attempt to quit: 06/13/2015    Years since quitting: 7.5   Smokeless tobacco: Never  Vaping Use   Vaping  Use: Never used  Substance and Sexual Activity   Alcohol use: Yes    Comment: occasionally   Drug use: No   Sexual activity: Not Currently  Other Topics Concern   Not on file  Social History Narrative   Not on file   Social Determinants of Health   Financial Resource Strain: High Risk (07/18/2017)   Overall Financial Resource Strain (CARDIA)    Difficulty of Paying Living Expenses: Very hard  Food Insecurity: Food Insecurity Present (07/18/2017)   Hunger Vital Sign    Worried About  Running Out of Food in the Last Year: Often true    Ran Out of Food in the Last Year: Often true  Transportation Needs: Unmet Transportation Needs (07/18/2017)   PRAPARE - Administrator, Civil Service (Medical): Yes    Lack of Transportation (Non-Medical): Yes  Physical Activity: Insufficiently Active (07/18/2017)   Exercise Vital Sign    Days of Exercise per Week: 5 days    Minutes of Exercise per Session: 20 min  Stress: Stress Concern Present (07/18/2017)   [employer] of Occupational Health - Occupational Stress Questionnaire    Feeling of Stress : Very much  Social Connections: Socially Isolated (07/18/2017)   Social Connection and Isolation Panel [NHANES]    Frequency of Communication with Friends and Family: Twice a week    Frequency of Social Gatherings with Friends and Family: Never    Attends Religious Services: Never    Database administrator or Organizations: No    Attends Banker Meetings: Never    Marital Status: Divorced  Catering manager Violence: Not At Risk (07/18/2017)   Humiliation, Afraid, Rape, and Kick questionnaire    Fear of Current or Ex-Partner: No    Emotionally Abused: No    Physically Abused: No    Sexually Abused: No    Review of Systems: See HPI, otherwise negative ROS  Physical Exam: LMP 03/13/2015 (Approximate) Comment: more than 2 years ago General:   Alert,  pleasant and cooperative in NAD Head:  Normocephalic and  atraumatic. Neck:  Supple; no masses or thyromegaly. Lungs:  Clear throughout to auscultation, normal respiratory effort.    Heart:  +S1, +S2, Regular rate and rhythm, No edema. Abdomen:  Soft, nontender and nondistended. Normal bowel sounds, without guarding, and without rebound.   Neurologic:  Alert and  oriented x4;  grossly normal neurologically.  Impression/Plan: Erica [name] is here for an colonoscopy to be performed for rectal bleeding Risks, benefits, limitations, and alternatives regarding  colonoscopy have been reviewed with the patient.  Questions have been answered.  All parties agreeable.   Wyline Mood, MD  12/16/2022, 10:38 AM

## 2022-12-17 ENCOUNTER — Encounter: Payer: Self-pay | Admitting: Gastroenterology

## 2022-12-18 DIAGNOSIS — R911 Solitary pulmonary nodule: Secondary | ICD-10-CM

## 2022-12-18 DIAGNOSIS — C801 Malignant (primary) neoplasm, unspecified: Secondary | ICD-10-CM

## 2022-12-18 HISTORY — DX: Solitary pulmonary nodule: R91.1

## 2022-12-18 HISTORY — DX: Malignant (primary) neoplasm, unspecified: C80.1

## 2022-12-20 ENCOUNTER — Telehealth: Payer: Self-pay

## 2022-12-20 DIAGNOSIS — C20 Malignant neoplasm of rectum: Secondary | ICD-10-CM

## 2022-12-20 NOTE — Telephone Encounter (Signed)
Called patient and left her a voicemail again to call me back so I could reschedule her colonoscopy with a 2 day prep.  Dr. Tobi Bastos wanted me to send the referral to Oncology so they could also reach out to the patient and schedule an appointment.

## 2022-12-22 NOTE — Telephone Encounter (Signed)
Called patient back again and no response. I was not able to leave her a voicemail since it's not set-up yet. I will send her a patient message via MyChart and hopefully she calls [place] back to reschedule her colonoscopy with a 2 day prep per Dr. Tobi Bastos.

## 2022-12-23 ENCOUNTER — Inpatient Hospital Stay: Payer: Medicaid Other | Attending: Oncology | Admitting: Oncology

## 2022-12-23 ENCOUNTER — Encounter: Payer: Self-pay | Admitting: Oncology

## 2022-12-23 ENCOUNTER — Inpatient Hospital Stay: Payer: Medicaid Other

## 2022-12-23 VITALS — BP 104/71 | HR 105 | Temp 99.1°F | Resp 18 | Wt 163.3 lb

## 2022-12-23 DIAGNOSIS — Z139 Encounter for screening, unspecified: Secondary | ICD-10-CM

## 2022-12-23 DIAGNOSIS — J209 Acute bronchitis, unspecified: Secondary | ICD-10-CM | POA: Insufficient documentation

## 2022-12-23 DIAGNOSIS — C20 Malignant neoplasm of rectum: Secondary | ICD-10-CM | POA: Insufficient documentation

## 2022-12-23 DIAGNOSIS — J441 Chronic obstructive pulmonary disease with (acute) exacerbation: Secondary | ICD-10-CM | POA: Insufficient documentation

## 2022-12-23 DIAGNOSIS — F17210 Nicotine dependence, cigarettes, uncomplicated: Secondary | ICD-10-CM | POA: Insufficient documentation

## 2022-12-23 DIAGNOSIS — J440 Chronic obstructive pulmonary disease with acute lower respiratory infection: Secondary | ICD-10-CM | POA: Insufficient documentation

## 2022-12-23 DIAGNOSIS — F17200 Nicotine dependence, unspecified, uncomplicated: Secondary | ICD-10-CM

## 2022-12-23 LAB — CBC WITH DIFFERENTIAL (CANCER CENTER ONLY)
Abs Immature Granulocytes: 0.06 10*3/uL (ref 0.00–0.07)
Basophils Absolute: 0.1 10*3/uL (ref 0.0–0.1)
Basophils Relative: 1 %
Eosinophils Absolute: 0.4 10*3/uL (ref 0.0–0.5)
Eosinophils Relative: 3 %
HCT: 42.1 % (ref 36.0–46.0)
Hemoglobin: 13.8 g/dL (ref 12.0–15.0)
Immature Granulocytes: 1 %
Lymphocytes Relative: 24 %
Lymphs Abs: 3 10*3/uL (ref 0.7–4.0)
MCH: 29.6 pg (ref 26.0–34.0)
MCHC: 32.8 g/dL (ref 30.0–36.0)
MCV: 90.3 fL (ref 80.0–100.0)
Monocytes Absolute: 0.9 10*3/uL (ref 0.1–1.0)
Monocytes Relative: 7 %
Neutro Abs: 8.2 10*3/uL — ABNORMAL HIGH (ref 1.7–7.7)
Neutrophils Relative %: 64 %
Platelet Count: 284 10*3/uL (ref 150–400)
RBC: 4.66 MIL/uL (ref 3.87–5.11)
RDW: 14.6 % (ref 11.5–15.5)
WBC Count: 12.6 10*3/uL — ABNORMAL HIGH (ref 4.0–10.5)
nRBC: 0 % (ref 0.0–0.2)

## 2022-12-23 LAB — CMP (CANCER CENTER ONLY)
ALT: 11 U/L (ref 0–44)
AST: 17 U/L (ref 15–41)
Albumin: 3.3 g/dL — ABNORMAL LOW (ref 3.5–5.0)
Alkaline Phosphatase: 73 U/L (ref 38–126)
Anion gap: 9 (ref 5–15)
BUN: 9 mg/dL (ref 6–20)
CO2: 23 mmol/L (ref 22–32)
Calcium: 8.5 mg/dL — ABNORMAL LOW (ref 8.9–10.3)
Chloride: 107 mmol/L (ref 98–111)
Creatinine: 0.6 mg/dL (ref 0.44–1.00)
GFR, Estimated: >60 mL/min (ref >60)
Glucose, Bld: 83 mg/dL (ref 70–99)
Potassium: 3.8 mmol/L (ref 3.5–5.1)
Sodium: 139 mmol/L (ref 135–145)
Total Bilirubin: 0.4 mg/dL (ref 0.3–1.2)
Total Protein: 6.7 g/dL (ref 6.5–8.1)

## 2022-12-23 MED ORDER — PREDNISONE 10 MG (21) PO TBPK: ORAL_TABLET | ORAL | 0 refills | Status: DC

## 2022-12-23 MED ORDER — AMOXICILLIN-POT CLAVULANATE 875-125 MG PO TABS: 1.0000 | ORAL_TABLET | Freq: Two times a day (BID) | ORAL | 0 refills | Status: DC

## 2022-12-23 NOTE — Assessment & Plan Note (Signed)
Smoke cessation was discussed with patient. 

## 2022-12-23 NOTE — Progress Notes (Signed)
Pt here for consult. Pt reports that she is having unintentional weightless.

## 2022-12-23 NOTE — Progress Notes (Addendum)
Hematology/Oncology Consult Note [phone] [phone]     REFERRING PROVIDER: Wyline Mood, MD    CHIEF COMPLAINTS/PURPOSE OF CONSULTATION:  Rectal cancer  ASSESSMENT & PLAN:   Rectal cancer [facility]) Pathology results were reviewed and discussed with patient. Recommend patient to have a repeat colonoscopy with 2 to 3 days prep, as recommended by gastroenterology colleagues. Recommend staging imaging with CT chest abdomen with contrast, MRI pelvis without contrast rectal protocol. Check CBC, CMP, CEA. Check MMR Further recommendation depending on staging and MMR results.  COPD (chronic obstructive pulmonary disease) with acute bronchitis (HCC) Recommend prednisone tapering course and Augmentin twice daily for 5 days.  Prescription was sent to pharmacy.   Tobacco use disorder Smoke cessation was discussed with patient.   Orders Placed This Encounter  Procedures   CT Chest W Contrast    Standing Status:   Future    Standing Expiration Date:   12/23/2023    Order Specific Question:   If indicated for the ordered procedure, I authorize the administration of contrast media per Radiology protocol    Answer:   Yes    Order Specific Question:   Does the patient have a contrast media/X-ray dye allergy?    Answer:   No    Order Specific Question:   Is patient pregnant?    Answer:   No    Order Specific Question:   Preferred imaging location?    Answer:   [facility]   CT Abdomen W Contrast    Standing Status:   Future    Standing Expiration Date:   12/23/2023    Order Specific Question:   If indicated for the ordered procedure, I authorize the administration of contrast media per Radiology protocol    Answer:   Yes    Order Specific Question:   Does the patient have a contrast media/X-ray dye allergy?    Answer:   No    Order Specific Question:   Is patient pregnant?    Answer:   No    Order Specific Question:   Pref[facility]?    [facility]    Order Specific Question:   If indicated for the ordered procedure, I authorize the administration of oral contrast media per Radiology protocol    Answer:   Yes   MR Pelvis Wo Contrast    Standing Status:   Future    Standing Expiration Date:   12/23/2023    Order Specific Question:   What is the patient's sedation requirement?    Answer:   No Sedation    Order Specific Question:   Does the patient have a pacemaker or implanted devices?    Answer:   No    Order Specific Question:   Preferred imaging location?    Answer:   [facility] (table limit - 550lbs)   CBC with Differential (Cancer Center Only)    Standing Status:   Future    Number of Occurrences:   1    Standing Expiration Date:   12/23/2023   CMP (Cancer Center only)    Standing Status:   Future    Number of Occurrences:   1    Standing Expiration Date:   12/23/2023   CEA    Standing Status:   Future    Number of Occurrences:   1    Standing Expiration Date:   12/23/2023   Ambulatory referral to Social Work    Referral Priority:  Routine    Referral Type:   Consultation    Referral Reason:   Specialty Services Required    Number of Visits Requested:   1   Follow up TBD All questions were answered. The patient knows to call the clinic with any problems, questions or concerns.  Rickard Patience, MD, PhD [facility] Hematology Oncology 12/23/2022    HISTORY OF PRESENTING ILLNESS:  Erica [name] 57 y.o. female presents to establish care for rectal cancer I have reviewed her chart and materials related to her cancer extensively and collaborated history with the patient. Summary of oncologic history is as follows: Oncology History  Rectal cancer (HCC)  12/23/2022 Initial Diagnosis   Rectal cancer [facility]) Patient was seen by gastroenterology due to hematochezia as well as unintentional weight loss.  12/15/2022, colonoscopy showed fungating partially obstructing large mass in the rectum, circumferential, mass  extended from about 15 cm from anus to about 8 cm from anal verge.  Biopsy was taken. Poor preparation of colon.  Colonoscopy was repeated on 12/16/2022, preparations inadequate.  Pathology showed invasive adenocarcinoma.    Patient reports a cough for about a week.  She smokes daily, has COPD.  Denies any fever, shortness of breath, hemoptysis. She has multiple cough spells during today's encounter She sold her house in [place] few years ago and currently lives with her daughter at [facility].  She plans to buy house and move back.  Family history positive for colon cancer, breast cancer lung cancer.   MEDICAL HISTORY:  Past Medical History:  Diagnosis Date   (HFpEF) heart failure with preserved ejection fraction (HCC)    a. 05/2015 Echo: EF 60-65%, no rwma, PASP .   Acute pancreatitis 08/13/2018   Arthritis    Asthma    Bell's palsy    Bronchitis    CHF (congestive heart failure) (HCC)    COPD (chronic obstructive pulmonary disease) (HCC)    Diabetes mellitus without complication (HCC)    type II 03/2017   Gastric ulcer    Hyperlipidemia    Hypertension    OSA (obstructive sleep apnea)    a. did not tolerate CPAP.   Pancreatitis    07/2018   Shoulder injury    6/19    SURGICAL HISTORY: Past Surgical History:  Procedure Laterality Date   COLONOSCOPY WITH PROPOFOL N/A 12/15/2022   Procedure: COLONOSCOPY WITH PROPOFOL;  Surgeon: Wyline Mood, MD;  Location: [facility] ENDOSCOPY;  Service: Gastroenterology;  Laterality: N/A;   COLONOSCOPY WITH PROPOFOL N/A 12/16/2022   Procedure: COLONOSCOPY WITH PROPOFOL;  Surgeon: Wyline Mood, MD;  Location: [facility] ENDOSCOPY;  Service: Gastroenterology;  Laterality: N/A;   FLEXIBLE BRONCHOSCOPY N/A 06/20/2015   Procedure: FLEXIBLE BRONCHOSCOPY;  Surgeon: Shane Crutch, MD;  Location: [facility] ORS;  Service: Pulmonary;  Laterality: N/A;   KNEE SURGERY Left    8 knee surgeries    SOCIAL HISTORY: Social History    Socioeconomic History   Marital status: Widowed    Spouse name: Not on file   Number of children: Not on file   Years of education: Not on file   Highest education level: Not on file  Occupational History   Occupation: unemployed  Tobacco Use   Smoking status: Every Day    Packs/day: 1.00    Years: 36.00    Additional pack years: 0.00    Total pack years: 36.00    Types: Cigarettes   Smokeless tobacco: Never  Vaping Use   Vaping Use: Never used  Substance  and Sexual Activity   Alcohol use: Yes    Comment: occasionally   Drug use: No   Sexual activity: Not Currently  Other Topics Concern   Not on file  Social History Narrative   Not on file   Social Determinants of Health   Financial Resource Strain: High Risk (07/18/2017)   Overall Financial Resource Strain (CARDIA)    Difficulty of Paying Living Expenses: Very hard  Food Insecurity: Food Insecurity Present (12/23/2022)   Hunger Vital Sign    Worried About Running Out of Food in the Last Year: Sometimes true    Ran Out of Food in the Last Year: Sometimes true  Transportation Needs: No Transportation Needs (12/23/2022)   PRAPARE - Administrator, Civil Service (Medical): No    Lack of Transportation (Non-Medical): No  Physical Activity: Insufficiently Active (07/18/2017)   Exercise Vital Sign    Days of Exercise per Week: 5 days    Minutes of Exercise per Session: 20 min  Stress: Stress Concern Present (07/18/2017)   [employer] of Occupational Health - Occupational Stress Questionnaire    Feeling of Stress : Very much  Social Connections: Socially Isolated (07/18/2017)   Social Connection and Isolation Panel [NHANES]    Frequency of Communication with Friends and Family: Twice a week    Frequency of Social Gatherings with Friends and Family: Never    Attends Religious Services: Never    Database administrator or Organizations: No    Attends Banker Meetings: Never    Marital Status:  Divorced  Catering manager Violence: Not At Risk (12/23/2022)   Humiliation, Afraid, Rape, and Kick questionnaire    Fear of Current or Ex-Partner: No    Emotionally Abused: No    Physically Abused: No    Sexually Abused: No    FAMILY HISTORY: Family History  Problem Relation Age of Onset   Breast cancer Mother        early 59's   Lung cancer Father    Brain cancer Father    Bone cancer Father    Diabetes Maternal Grandmother    Asthma Other    Colon cancer Maternal Aunt     ALLERGIES:  is allergic to tramadol and penicillins.  MEDICATIONS:  Current Outpatient Medications  Medication Sig Dispense Refill   albuterol (VENTOLIN HFA) 108 (90 Base) MCG/ACT inhaler Inhale 2 puffs into the lungs every 6 (six) hours as needed for wheezing or shortness of breath.      amoxicillin-clavulanate (AUGMENTIN) 875-125 MG tablet Take 1 tablet by mouth 2 (two) times daily. 10 tablet 0   aspirin EC 81 MG EC tablet Take 1 tablet (81 mg total) by mouth daily. 30 tablet 0   Blood Glucose Monitoring Suppl (ACCU-CHEK AVIVA PLUS) w/Device KIT See admin instructions.  0   busPIRone (BUSPAR) 30 MG tablet Take 30 mg by mouth 2 (two) times daily. PER PT STATES HER DOSE WAS INCREASED     Cholecalciferol (VITAMIN D3) 50 MCG (2000 UT) capsule Take 1 capsule (2,000 Units total) by mouth daily. Take 1 capsule (2,000 Units total) by mouth daily. 30 capsule 2   clonazePAM (KLONOPIN) 0.5 MG tablet Take 0.5 mg by mouth at bedtime as needed.     diltiazem (CARDIZEM CD) 120 MG 24 hr capsule TAKE 1 CAPSULE BY MOUTH EVERY DAY 90 capsule 3   diltiazem (CARDIZEM) 30 MG tablet TAKE 1 TABLET(30 MG) BY MOUTH THREE TIMES DAILY AS NEEDED 270 tablet 0  DULoxetine (CYMBALTA) 60 MG capsule Take 60 mg by mouth 2 (two) times daily.      fluconazole (DIFLUCAN) 150 MG tablet Take 150 mg by mouth once.     Fluticasone-Salmeterol (ADVAIR) 250-50 MCG/DOSE AEPB Inhale 1 puff into the lungs 2 (two) times daily.     furosemide (LASIX) 40  MG tablet TAKE 1 TABLET BY MOUTH EVERY DAY. MAY TAKE AN ADDITIONAL TABLET IN THE AM FOR WEIGHT GAIN AS NEEDED 110 tablet 3   JARDIANCE 25 MG TABS tablet Take 25 mg by mouth in the morning and at bedtime.     Magnesium 500 MG CAPS once daily.     metFORMIN (GLUCOPHAGE-XR) 500 MG 24 hr tablet Take 1,000 mg by mouth 2 (two) times daily.     OXYGEN Inhale 2 L into the lungs at bedtime.      pantoprazole (PROTONIX) 40 MG tablet Take 1 tablet (40 mg total) by mouth daily. 30 tablet 0   potassium chloride SA (KLOR-CON) 20 MEQ tablet Take 20 mEq by mouth 2 (two) times daily.     predniSONE (STERAPRED UNI-PAK 21 TAB) 10 MG (21) TBPK tablet Day 1 take 6 tablets, Day 2 take 5 tablets, Day 3 take 4 tablets, Day 4 take 3 tablets, Day 5 take 2 tablets D6 take 1 tablet 1 each 0   rosuvastatin (CRESTOR) 20 MG tablet Take 20 mg by mouth daily.     spironolactone (ALDACTONE) 25 MG tablet Take 1 tablet (25 mg total) by mouth every morning. 90 tablet 3   sucralfate (CARAFATE) 1 G tablet Take 1 tablet (1 g total) by mouth 4 (four) times daily -  with meals and at bedtime. (Patient taking differently: Take 1 g by mouth 2 (two) times daily.) 120 tablet 0   tiotropium (SPIRIVA) 18 MCG inhalation capsule Place 18 mcg into inhaler and inhale daily.     tiZANidine (ZANAFLEX) 4 MG tablet Take 4 mg by mouth 3 (three) times daily as needed.     zolpidem (AMBIEN) 10 MG tablet Take 10 mg by mouth at bedtime as needed.     pregabalin (LYRICA) 150 MG capsule Take 1 capsule (150 mg total) by mouth 3 (three) times daily. 90 capsule 2   No current facility-administered medications for this visit.    Review of Systems  Constitutional:  Positive for unexpected weight change. Negative for appetite change, chills, fatigue and fever.  HENT:   Negative for hearing loss and voice change.   Eyes:  Negative for eye problems.  Respiratory:  Positive for cough. Negative for chest tightness and hemoptysis.   Cardiovascular:  Negative for  chest pain.  Gastrointestinal:  Positive for blood in stool. Negative for abdominal distention and abdominal pain.  Endocrine: Negative for hot flashes.  Genitourinary:  Negative for difficulty urinating and frequency.   Musculoskeletal:  Negative for arthralgias.  Skin:  Negative for itching and rash.  Neurological:  Negative for extremity weakness.  Hematological:  Negative for adenopathy.  Psychiatric/Behavioral:  Negative for confusion.      PHYSICAL EXAMINATION: ECOG PERFORMANCE STATUS: 1 - Symptomatic but completely ambulatory  Vitals:   12/23/22 1520  BP: 104/71  Pulse: (!) 105  Resp: 18  Temp: 99.1 F (37.3 C)   Filed Weights   12/23/22 1520  Weight: 163 lb 4.8 oz (74.1 kg)    Physical Exam Constitutional:      General: She is not in acute distress.    Appearance: She is not diaphoretic.  HENT:  Head: Normocephalic and atraumatic.     Nose: Nose normal.     Mouth/Throat:     Pharynx: No oropharyngeal exudate.  Eyes:     General: No scleral icterus.    Pupils: Pupils are equal, round, and reactive to light.  Cardiovascular:     Rate and Rhythm: Normal rate and regular rhythm.     Heart sounds: No murmur heard. Pulmonary:     Effort: Pulmonary effort is normal. No respiratory distress.     Comments: Decreased breath sound bilaterally. Abdominal:     General: There is no distension.     Palpations: Abdomen is soft.     Tenderness: There is no abdominal tenderness.  Musculoskeletal:        General: Normal range of motion.     Cervical back: Normal range of motion and neck supple.  Skin:    General: Skin is warm and dry.     Findings: No erythema.  Neurological:     Mental Status: She is alert and oriented to person, place, and time.     Cranial Nerves: No cranial nerve deficit.     Motor: No abnormal muscle tone.     Coordination: Coordination normal.  Psychiatric:        Mood and Affect: Affect normal.      LABORATORY DATA:  I have reviewed  the data as listed    Latest Ref Rng & Units 12/23/2022    4:07 PM 12/09/2022    3:15 PM 04/22/2022    5:52 PM  CBC  WBC 4.0 - 10.5 K/uL 12.6  9.4  9.6   Hemoglobin 12.0 - 15.0 g/dL 30.8  65.7  84.6   Hematocrit 36.0 - 46.0 % 42.1  41.4  46.3   Platelets 150 - 400 K/uL 284  288  235       Latest Ref Rng & Units 12/23/2022    4:07 PM 04/22/2022    5:52 PM 02/02/2022    7:20 PM  CMP  Glucose 70 - 99 mg/dL 83  962  952   BUN 6 - 20 mg/dL 9  10  <5   Creatinine 0.44 - 1.00 mg/dL 8.41  3.24  4.01   Sodium 135 - 145 mmol/L 139  138  137   Potassium 3.5 - 5.1 mmol/L 3.8  3.9  2.9   Chloride 98 - 111 mmol/L 107  101  96   CO2 22 - 32 mmol/L 23  26  24    Calcium 8.9 - 10.3 mg/dL 8.5  9.2  9.7   Total Protein 6.5 - 8.1 g/dL 6.7  6.8  8.1   Total Bilirubin 0.3 - 1.2 mg/dL 0.4  0.8  0.7   Alkaline Phos 38 - 126 U/L 73  87  61   AST 15 - 41 U/L 17  31  35   ALT 0 - 44 U/L 11  16  16       RADIOGRAPHIC STUDIES: I have personally reviewed the radiological images as listed and agreed with the findings in the report. No results found.

## 2022-12-23 NOTE — Assessment & Plan Note (Addendum)
Pathology results were reviewed and discussed with patient. Recommend patient to have a repeat colonoscopy with 2 to 3 days prep, as recommended by gastroenterology colleagues. Recommend staging imaging with CT chest abdomen with contrast, MRI pelvis without contrast rectal protocol. Check CBC, CMP, CEA. Check MMR Further recommendation depending on staging and MMR results.

## 2022-12-23 NOTE — Assessment & Plan Note (Signed)
Recommend prednisone tapering course and Augmentin twice daily for 5 days.  Prescription was sent to pharmacy.

## 2022-12-24 ENCOUNTER — Telehealth: Payer: Self-pay

## 2022-12-24 ENCOUNTER — Other Ambulatory Visit: Payer: Self-pay

## 2022-12-24 LAB — CEA: CEA: 18.1 ng/mL — ABNORMAL HIGH (ref 0.0–4.7)

## 2022-12-24 MED ORDER — FUROSEMIDE 40 MG PO TABS: ORAL_TABLET | ORAL | 0 refills | Status: DC

## 2022-12-24 NOTE — Telephone Encounter (Signed)
-----   Message from Rickard Patience, MD sent at 12/23/2022  9:29 PM EDT ----- Please ask [facility] pathology to add on MMR staining on her rectal cancer specimen. Thank  you

## 2022-12-24 NOTE — Telephone Encounter (Signed)
Contacted [facility] path and spoke to Angie for MMR stating to specimen: [id number]

## 2022-12-28 NOTE — Progress Notes (Signed)
Met with Erica [name]. Introduced Visual merchandiser. She will contact [facility] for repeat colonoscopy. Dr. Cathie Hoops discussed genetic testing and she will discuss again in the future. She reports she is the last in her family with no biological children. No immediate barriers to care. She does currently live in [place] but will reside closer to [place] with another one of her children once treatment starts until she obtains her own residence again.

## 2022-12-29 ENCOUNTER — Inpatient Hospital Stay: Payer: Medicaid Other | Admitting: Licensed Clinical Social Worker

## 2022-12-29 NOTE — Progress Notes (Signed)
[facility] Clinical Social Work  Initial Assessment   CARSEN MACHI is a 57 y.o. year old female contacted by phone. Clinical Social Work was referred by medical provider for assessment of psychosocial needs.   SDOH (Social Determinants of Health) assessments performed: Yes SDOH Interventions    Flowsheet Row Office Visit from 08/29/2018 in [facility] Visit from 03/04/2016 in [facility]  SDOH Interventions    Depression Interventions/Treatment  Medication Currently on Treatment       SDOH Screenings   Food Insecurity: Food Insecurity Present (12/23/2022)  Housing: Low Risk  (12/23/2022)  Transportation Needs: No Transportation Needs (12/23/2022)  Utilities: At Risk (12/23/2022)  Depression (PHQ2-9): Medium Risk (12/23/2022)  Financial Resource Strain: High Risk (07/18/2017)  Physical Activity: Insufficiently Active (07/18/2017)  Social Connections: Socially Isolated (07/18/2017)  Stress: Stress Concern Present (07/18/2017)  Tobacco Use: High Risk (12/23/2022)     Distress Screen completed: No    12/23/2022    3:17 PM  ONCBCN DISTRESS SCREENING  Screening Type Initial Screening  Distress experienced in past week (1-10) 0      Family/Social Information:  Housing Arrangement: She is currently staying in a hotel while she has testing done in [place].  She lives with her daughter and grandchildren in [place], [place]. Family members/support persons in your life? Family and Friends Transportation concerns: no  Employment: Legally disabled. Income source: Social Security Disability Financial concerns: No Type of concern: None Food access concerns: Patient stated she did not currently have food insecurity.  Religious or spiritual practice: Yes-Patient identifies as [place]. Services Currently in place:  Medicaid  Coping/ Adjustment to diagnosis: Patient understands  treatment plan and what happens next? yes Concerns about diagnosis and/or treatment: Afraid of cancer Patient reported stressors: Her two adopted daughters and grandchildren.  She reports most of her family died from cancer and she does not want her grandchildren to witness this. Hopes and/or priorities: Her family. Patient enjoys time with family/ friends Current coping skills/ strengths: Capable of independent living , Manufacturing systems engineer , General fund of knowledge , Motivation for treatment/growth , and Supportive family/friends     SUMMARY: Current SDOH Barriers:  Financial constraints related to fixed income.  Clinical Social Work Clinical Goal(s):  Patient will work with SW to address concerns related to adjusting to her cancer.  Interventions: Discussed common feeling and emotions when being diagnosed with cancer, and the importance of support during treatment Informed patient of the support team roles and support services at [facility] Provided CSW contact information and encouraged patient to call with any questions or concerns Provided patient with information about the Nest and food pantry at [facility].   Follow Up Plan: CSW will follow-up with patient by phone  Patient verbalizes understanding of plan: Yes    Dorothey Baseman, LCSW Clinical Social Worker [facility]  Patient is participating in a Managed Medicaid Plan:  Yes

## 2023-01-03 ENCOUNTER — Ambulatory Visit
Admission: RE | Admit: 2023-01-03 | Discharge: 2023-01-03 | Disposition: A | Discharge status: Home / Self Care | Payer: Medicaid Other | Source: Ambulatory Visit | Attending: Oncology | Admitting: Oncology

## 2023-01-03 ENCOUNTER — Other Ambulatory Visit: Payer: Self-pay | Admitting: Cardiovascular Disease

## 2023-01-03 DIAGNOSIS — C20 Malignant neoplasm of rectum: Secondary | ICD-10-CM

## 2023-01-03 MED ORDER — IOHEXOL 300 MG/ML  SOLN
100.0000 mL | Freq: Once | INTRAMUSCULAR | Status: AC | PRN
  Administered 2023-01-03: 100 mL via INTRAVENOUS

## 2023-01-04 ENCOUNTER — Other Ambulatory Visit: Payer: Self-pay | Admitting: Cardiovascular Disease

## 2023-01-05 ENCOUNTER — Inpatient Hospital Stay: Payer: Medicaid Other

## 2023-01-10 ENCOUNTER — Telehealth: Payer: Self-pay | Admitting: *Deleted

## 2023-01-10 ENCOUNTER — Other Ambulatory Visit: Payer: Self-pay

## 2023-01-10 DIAGNOSIS — R911 Solitary pulmonary nodule: Secondary | ICD-10-CM

## 2023-01-10 DIAGNOSIS — C20 Malignant neoplasm of rectum: Secondary | ICD-10-CM

## 2023-01-10 NOTE — Telephone Encounter (Signed)
Spoke to Ms. Palladino. Scans reviewed and referral placed to pulmonary to arrange lung biopsy.

## 2023-01-10 NOTE — Telephone Encounter (Addendum)
Patient called asking for results from last week. She has no follow up appointment   IMPRESSION: 2.2 cm left upper lobe nodule, suspicious for metastasis.   No findings suspicious for metastatic disease in the abdomen.   Bilateral adrenal adenomas, benign.   Cholelithiasis, without associated inflammatory changes.   Aortic Atherosclerosis (ICD10-I70.0) and Emphysema (ICD10-J43.9).     Electronically Signed   By: Charline Bills M.D.   On: 01/08/2023 01:15  IMPRESSION: 9.2 cm concentric mid/lower rectal mass, corresponding to the patient's known primary rectal adenocarcinoma, as above.   Rectal adenocarcinoma T stage: T3c   Rectal adenocarcinoma N stage:  N2   Distance from tumor to the internal anal sphincter is 3.4 cm.     Electronically Signed   By: Charline Bills M.D.   On: 01/08/2023 01:23

## 2023-01-13 ENCOUNTER — Ambulatory Visit: Payer: Medicaid Other | Admitting: Student in an Organized Health Care Education/Training Program

## 2023-01-13 ENCOUNTER — Encounter: Payer: Self-pay | Admitting: Student in an Organized Health Care Education/Training Program

## 2023-01-13 VITALS — BP 110/68 | HR 105 | Temp 97.8°F | Ht 68.0 in | Wt 169.4 lb

## 2023-01-13 DIAGNOSIS — R911 Solitary pulmonary nodule: Secondary | ICD-10-CM

## 2023-01-13 NOTE — H&P (View-Only) (Signed)
 Synopsis: Referred in for pulmonary nodule by Patel, Sarah, MD  Assessment & Plan:   1. Lung nodule  Nodule Location: LUL Nodule Size: 2.2 cm Nodule Spiculation: No Associated Lymphadenopathy: N/A Smoking Status (current) and pack years 46 Extrathoracic cancer > 5 years prior (yes): Rectal cancer SPN malignancy risk score ([facility] Model): N/A ECOG: 0  The patient is here to discuss their imaging abnormalities which include a LUL pulmonary nodule found in the setting of a primary rectal adenocarcinoma. I discussed with the patient and her daughter that the differential for this nodule includes a metastatic deposit from the rectal cancer, a primary lung malignancy, as well as a benign lung nodule.  We discussed the importance of diagnosis and staging in lung malignancies, and the approach to obtaining a tissue diagnosis which would include robotic assisted navigational bronchoscopy with endobronchial ultrasound guided sampling.  We also discussed the risks associated with the procedure which include a 2% risk of pneumothorax, infection, bleeding, and nondiagnostic procedure in detail.  I explained that patients typically are able to return home the same day of the procedure, but in rare cases admission to the hospital for observation and treatment is required.  After our discussion, the patient elected to proceed with the procedure  Recommendations:  - Procedural/ Surgical Case Request: ROBOTIC ASSISTED NAVIGATIONAL BRONCHOSCOPY and Endobronchial Ultrasound - CT Super D Chest Wo Contrast; Future   No follow-ups on file.  I spent 45 minutes caring for this patient today, including preparing to see the patient, obtaining a medical history , reviewing a separately obtained history, performing a medically appropriate examination and/or evaluation, counseling and educating the patient/family/caregiver, ordering medications, tests, or procedures, documenting clinical information in the  electronic health record, and independently interpreting results (not separately reported/billed) and communicating results to the patient/family/caregiver  Erica Yanko, MD [facility] 01/13/2023 4:10 PM    End of visit medications:  No orders of the defined types were placed in this encounter.    Current Outpatient Medications:    albuterol (VENTOLIN HFA) 108 (90 Base) MCG/ACT inhaler, Inhale 2 puffs into the lungs every 6 (six) hours as needed for wheezing or shortness of breath. , Disp: , Rfl:    aspirin EC 81 MG EC tablet, Take 1 tablet (81 mg total) by mouth daily., Disp: 30 tablet, Rfl: 0   Blood Glucose Monitoring Suppl (ACCU-CHEK AVIVA PLUS) w/Device KIT, See admin instructions., Disp: , Rfl: 0   busPIRone (BUSPAR) 30 MG tablet, Take 30 mg by mouth 2 (two) times daily. PER PT STATES HER DOSE WAS INCREASED, Disp: , Rfl:    clonazePAM (KLONOPIN) 0.5 MG tablet, Take 0.5 mg by mouth at bedtime as needed., Disp: , Rfl:    diltiazem (CARDIZEM CD) 120 MG 24 hr capsule, TAKE 1 CAPSULE BY MOUTH EVERY DAY, Disp: 60 capsule, Rfl: 1   diltiazem (CARDIZEM) 30 MG tablet, TAKE 1 TABLET(30 MG) BY MOUTH THREE TIMES DAILY AS NEEDED, Disp: 270 tablet, Rfl: 0   DULoxetine (CYMBALTA) 60 MG capsule, Take 60 mg by mouth 2 (two) times daily. , Disp: , Rfl:    fluconazole (DIFLUCAN) 150 MG tablet, Take 150 mg by mouth once., Disp: , Rfl:    Fluticasone-Salmeterol (ADVAIR) 250-50 MCG/DOSE AEPB, Inhale 1 puff into the lungs 2 (two) times daily., Disp: , Rfl:    furosemide (LASIX) 40 MG tablet, TAKE 1 TABLET BY MOUTH EVERY DAY. MAY TAKE AN ADDITIONAL TABLET IN THE AM FOR WEIGHT GAIN AS NEEDED, Disp:   110 tablet, Rfl: 0   JARDIANCE 25 MG TABS tablet, Take 25 mg by mouth in the morning and at bedtime., Disp: , Rfl:    Magnesium 500 MG CAPS, once daily., Disp: , Rfl:    metFORMIN (GLUCOPHAGE-XR) 500 MG 24 hr tablet, Take 1,000 mg by mouth 2 (two) times daily., Disp: , Rfl:    OXYGEN, Inhale  2 L into the lungs at bedtime. , Disp: , Rfl:    pantoprazole (PROTONIX) 40 MG tablet, Take 1 tablet (40 mg total) by mouth daily., Disp: 30 tablet, Rfl: 0   potassium chloride SA (KLOR-CON) 20 MEQ tablet, Take 20 mEq by mouth 2 (two) times daily., Disp: , Rfl:    rosuvastatin (CRESTOR) 20 MG tablet, Take 20 mg by mouth daily., Disp: , Rfl:    spironolactone (ALDACTONE) 25 MG tablet, TAKE 1 TABLET(25 MG) BY MOUTH EVERY MORNING, Disp: 90 tablet, Rfl: 0   sucralfate (CARAFATE) 1 G tablet, Take 1 tablet (1 g total) by mouth 4 (four) times daily -  with meals and at bedtime. (Patient taking differently: Take 1 g by mouth 2 (two) times daily.), Disp: 120 tablet, Rfl: 0   tiotropium (SPIRIVA) 18 MCG inhalation capsule, Place 18 mcg into inhaler and inhale daily., Disp: , Rfl:    tiZANidine (ZANAFLEX) 4 MG tablet, Take 4 mg by mouth 3 (three) times daily as needed., Disp: , Rfl:    zolpidem (AMBIEN) 10 MG tablet, Take 10 mg by mouth at bedtime as needed., Disp: , Rfl:    Cholecalciferol (VITAMIN D3) 50 MCG (2000 UT) capsule, Take 1 capsule (2,000 Units total) by mouth daily. Take 1 capsule (2,000 Units total) by mouth daily., Disp: 30 capsule, Rfl: 2   pregabalin (LYRICA) 150 MG capsule, Take 1 capsule (150 mg total) by mouth 3 (three) times daily., Disp: 90 capsule, Rfl: 2   Subjective:   PATIENT ID: Erica [name] GENDER: female DOB: 10/13/1965, MRN: [id number]  Chief Complaint  Patient presents with   pulmonary consult    CT 6/22-SOB with exertion, prod cough with thick green sputum and occ wheezing.    HPI  Patient is a pleasant 57 year old female recently diagnosed with rectal cancer who is presenting to clinic for the evaluation of a pulmonary nodule.  Patient was initially seen by Dr. Anna from GI for the chief complaint of rectal bleeding and change in bowel habits for which she underwent urgent colonoscopy. Colonoscopy performed 12/15/2022 showed  a fungating, partially obstructing large  mass in the rectum. This was biopsied and pathology showed invasive adenocarcinoma. She was referred to oncology and seen by Dr. Yu. Imaging included an MRI of the pelvis showing a 9.2 cm mid/lower rectal mass and a CT scan of the chest that showed a 2.2 cm left upper lobe nodule. Patient is referred to pulmonary for an evaluation.  She reports a history of COPD for which she is maintained on Advair and Spiriva. She was previously followed by Dr. Fleming at [facility] (last seen 05/2020) for COPD. She reports exertional dyspnea and a cough. No chest pain or chest tightness reported, no wheezing. Patient has had some weight loss.  Patient has a long standing history of smoking, with 46 pack years of smoking history. She smokes 1 pack a day.  Ancillary information including prior medications, full medical/surgical/family/social histories, and PFTs (when available) are listed below and have been reviewed.   Review of Systems  Constitutional:  Positive for malaise/fatigue and weight loss.   Negative for chills and fever.  Respiratory:  Positive for cough, sputum production and shortness of breath.   Cardiovascular:  Negative for chest pain.     Objective:   Vitals:   01/13/23 1511  BP: 110/68  Pulse: (!) 105  Temp: 97.8 F (36.6 C)  TempSrc: Temporal  SpO2: 98%  Weight: 169 lb 6.4 oz (76.8 kg)  Height: 5' 8" (1.727 m)   98% on RA  BMI Readings from Last 3 Encounters:  01/13/23 25.76 kg/m  12/23/22 24.83 kg/m  12/16/22 24.33 kg/m   Wt Readings from Last 3 Encounters:  01/13/23 169 lb 6.4 oz (76.8 kg)  12/23/22 163 lb 4.8 oz (74.1 kg)  12/16/22 160 lb (72.6 kg)    Physical Exam Constitutional:      Appearance: Normal appearance. She is not ill-appearing.  HENT:     Head: Normocephalic.     Nose: Nose normal.     Mouth/Throat:     Mouth: Mucous membranes are moist.  Cardiovascular:     Rate and Rhythm: Normal rate and regular rhythm.     Pulses: Normal pulses.      Heart sounds: Normal heart sounds.  Pulmonary:     Effort: Pulmonary effort is normal.     Breath sounds: Normal breath sounds.  Abdominal:     General: There is distension.     Palpations: Abdomen is soft.  Neurological:     General: No focal deficit present.     Mental Status: She is alert and oriented to person, place, and time. Mental status is at baseline.     Ancillary Information    Past Medical History:  Diagnosis Date   (HFpEF) heart failure with preserved ejection fraction (HCC)    a. 05/2015 Echo: EF 60-65%, no rwma, PASP 56mmHg.   Acute pancreatitis 08/13/2018   Arthritis    Asthma    Bell's palsy    Bronchitis    CHF (congestive heart failure) (HCC)    COPD (chronic obstructive pulmonary disease) (HCC)    Diabetes mellitus without complication (HCC)    type II 03/2017   Gastric ulcer    Hyperlipidemia    Hypertension    OSA (obstructive sleep apnea)    a. did not tolerate CPAP.   Pancreatitis    07/2018   Shoulder injury    6/19     Family History  Problem Relation Age of Onset   Breast cancer Mother        early 60's   Lung cancer Father    Brain cancer Father    Bone cancer Father    Diabetes Maternal Grandmother    Asthma Other    Colon cancer Maternal Aunt      Past Surgical History:  Procedure Laterality Date   COLONOSCOPY WITH PROPOFOL N/A 12/15/2022   Procedure: COLONOSCOPY WITH PROPOFOL;  Surgeon: Erica [name], Kiran, MD;  Location: [facility];  Service: Gastroenterology;  Laterality: N/A;   COLONOSCOPY WITH PROPOFOL N/A 12/16/2022   Procedure: COLONOSCOPY WITH PROPOFOL;  Surgeon: Erica [name], Kiran, MD;  Location: [facility];  Service: Gastroenterology;  Laterality: N/A;   FLEXIBLE BRONCHOSCOPY N/A 06/20/2015   Procedure: FLEXIBLE BRONCHOSCOPY;  Surgeon: Pradeep Ramachandran, MD;  Location: [facility];  Service: Pulmonary;  Laterality: N/A;   KNEE SURGERY Left    8 knee surgeries    Social History   Socioeconomic History   Marital status:  Widowed    Spouse name: Not on file   Number of children: Not on file     Years of education: Not on file   Highest education level: Not on file  Occupational History   Occupation: unemployed  Tobacco Use   Smoking status: Every Day    Packs/day: 1.00    Years: 46.00    Additional pack years: 0.00    Total pack years: 46.00    Types: Cigarettes   Smokeless tobacco: Never   Tobacco comments:    0.5PPD 01/13/2023  Vaping Use   Vaping Use: Never used  Substance and Sexual Activity   Alcohol use: Yes    Comment: occasionally   Drug use: No   Sexual activity: Not Currently  Other Topics Concern   Not on file  Social History Narrative   Not on file   Social Determinants of Health   Financial Resource Strain: High Risk (07/18/2017)   Overall Financial Resource Strain (CARDIA)    Difficulty of Paying Living Expenses: Very hard  Food Insecurity: Food Insecurity Present (12/23/2022)   Hunger Vital Sign    Worried About Running Out of Food in the Last Year: Sometimes true    Ran Out of Food in the Last Year: Sometimes true  Transportation Needs: No Transportation Needs (12/23/2022)   PRAPARE - Transportation    Lack of Transportation (Medical): No    Lack of Transportation (Non-Medical): No  Physical Activity: Insufficiently Active (07/18/2017)   Exercise Vital Sign    Days of Exercise per Week: 5 days    Minutes of Exercise per Session: 20 min  Stress: Stress Concern Present (07/18/2017)   [employer] - Occupational Stress Questionnaire    Feeling of Stress : Very much  Social Connections: Socially Isolated (07/18/2017)   Social Connection and Isolation Panel [NHANES]    Frequency of Communication with Friends and Family: Twice a week    Frequency of Social Gatherings with Friends and Family: Never    Attends Religious Services: Never    Active Member of Clubs or Organizations: No    Attends Club or Organization Meetings: Never    Marital Status:  Divorced  Intimate Partner Violence: Not At Risk (12/23/2022)   Humiliation, Afraid, Rape, and Kick questionnaire    Fear of Current or Ex-Partner: No    Emotionally Abused: No    Physically Abused: No    Sexually Abused: No     Allergies  Allergen Reactions   Tramadol Other (See Comments) and Palpitations    Heart Skipping Beats Heart palpatations   Penicillins Rash    Has patient had a PCN reaction causing immediate rash, facial/tongue/throat swelling, SOB or lightheadedness with hypotension: Yes Has patient had a PCN reaction causing severe rash involving mucus membranes or skin necrosis: No Has patient had a PCN reaction that required hospitalization: No Has patient had a PCN reaction occurring within the last 10 years: No If all of the above answers are "NO", then may proceed with Cephalosporin use.     CBC    Component Value Date/Time   WBC 12.6 (H) 12/23/2022 1607   WBC 9.6 04/22/2022 1752   RBC 4.66 12/23/2022 1607   HGB 13.8 12/23/2022 1607   HGB 13.9 12/09/2022 1515   HCT 42.1 12/23/2022 1607   HCT 41.4 12/09/2022 1515   PLT 284 12/23/2022 1607   PLT 288 12/09/2022 1515   MCV 90.3 12/23/2022 1607   MCV 91 12/09/2022 1515   MCV 102 (H) 02/26/2014 1826   MCH 29.6 12/23/2022 1607   MCHC 32.8 12/23/2022 1607   RDW 14.6 12/23/2022   1607   RDW 14.1 12/09/2022 1515   RDW 14.3 02/26/2014 1826   LYMPHSABS 3.0 12/23/2022 1607   LYMPHSABS 2.4 12/09/2022 1515   MONOABS 0.9 12/23/2022 1607   EOSABS 0.4 12/23/2022 1607   EOSABS 0.5 (H) 12/09/2022 1515   BASOSABS 0.1 12/23/2022 1607   BASOSABS 0.1 12/09/2022 1515    Pulmonary Functions Testing Results:     No data to display          Outpatient Medications Prior to Visit  Medication Sig Dispense Refill   albuterol (VENTOLIN HFA) 108 (90 Base) MCG/ACT inhaler Inhale 2 puffs into the lungs every 6 (six) hours as needed for wheezing or shortness of breath.      aspirin EC 81 MG EC tablet Take 1 tablet (81 mg total)  by mouth daily. 30 tablet 0   Blood Glucose Monitoring Suppl (ACCU-CHEK AVIVA PLUS) w/Device KIT See admin instructions.  0   busPIRone (BUSPAR) 30 MG tablet Take 30 mg by mouth 2 (two) times daily. PER PT STATES HER DOSE WAS INCREASED     clonazePAM (KLONOPIN) 0.5 MG tablet Take 0.5 mg by mouth at bedtime as needed.     diltiazem (CARDIZEM CD) 120 MG 24 hr capsule TAKE 1 CAPSULE BY MOUTH EVERY DAY 60 capsule 1   diltiazem (CARDIZEM) 30 MG tablet TAKE 1 TABLET(30 MG) BY MOUTH THREE TIMES DAILY AS NEEDED 270 tablet 0   DULoxetine (CYMBALTA) 60 MG capsule Take 60 mg by mouth 2 (two) times daily.      fluconazole (DIFLUCAN) 150 MG tablet Take 150 mg by mouth once.     Fluticasone-Salmeterol (ADVAIR) 250-50 MCG/DOSE AEPB Inhale 1 puff into the lungs 2 (two) times daily.     furosemide (LASIX) 40 MG tablet TAKE 1 TABLET BY MOUTH EVERY DAY. MAY TAKE AN ADDITIONAL TABLET IN THE AM FOR WEIGHT GAIN AS NEEDED 110 tablet 0   JARDIANCE 25 MG TABS tablet Take 25 mg by mouth in the morning and at bedtime.     Magnesium 500 MG CAPS once daily.     metFORMIN (GLUCOPHAGE-XR) 500 MG 24 hr tablet Take 1,000 mg by mouth 2 (two) times daily.     OXYGEN Inhale 2 L into the lungs at bedtime.      pantoprazole (PROTONIX) 40 MG tablet Take 1 tablet (40 mg total) by mouth daily. 30 tablet 0   potassium chloride SA (KLOR-CON) 20 MEQ tablet Take 20 mEq by mouth 2 (two) times daily.     rosuvastatin (CRESTOR) 20 MG tablet Take 20 mg by mouth daily.     spironolactone (ALDACTONE) 25 MG tablet TAKE 1 TABLET(25 MG) BY MOUTH EVERY MORNING 90 tablet 0   sucralfate (CARAFATE) 1 G tablet Take 1 tablet (1 g total) by mouth 4 (four) times daily -  with meals and at bedtime. (Patient taking differently: Take 1 g by mouth 2 (two) times daily.) 120 tablet 0   tiotropium (SPIRIVA) 18 MCG inhalation capsule Place 18 mcg into inhaler and inhale daily.     tiZANidine (ZANAFLEX) 4 MG tablet Take 4 mg by mouth 3 (three) times daily as needed.      zolpidem (AMBIEN) 10 MG tablet Take 10 mg by mouth at bedtime as needed.     Cholecalciferol (VITAMIN D3) 50 MCG (2000 UT) capsule Take 1 capsule (2,000 Units total) by mouth daily. Take 1 capsule (2,000 Units total) by mouth daily. 30 capsule 2   pregabalin (LYRICA) 150 MG capsule Take 1 capsule (  150 mg total) by mouth 3 (three) times daily. 90 capsule 2   amoxicillin-clavulanate (AUGMENTIN) 875-125 MG tablet Take 1 tablet by mouth 2 (two) times daily. 10 tablet 0   predniSONE (STERAPRED UNI-PAK 21 TAB) 10 MG (21) TBPK tablet Day 1 take 6 tablets, Day 2 take 5 tablets, Day 3 take 4 tablets, Day 4 take 3 tablets, Day 5 take 2 tablets D6 take 1 tablet 1 each 0   No facility-administered medications prior to visit.   

## 2023-01-13 NOTE — Progress Notes (Signed)
Synopsis: Referred in for pulmonary nodule by Erica Aldo, MD  Assessment & Plan:   1. Lung nodule  Nodule Location: LUL Nodule Size: 2.2 cm Nodule Spiculation: No Associated Lymphadenopathy: N/A Smoking Status (current) and pack years 46 Extrathoracic cancer > 5 years prior (yes): Rectal cancer SPN malignancy risk score [facility]): N/A ECOG: 0  The patient is here to discuss their imaging abnormalities which include a LUL pulmonary nodule found in the setting of a primary rectal adenocarcinoma. I discussed with the patient and her daughter that the differential for this nodule includes a metastatic deposit from the rectal cancer, a primary lung malignancy, as well as a benign lung nodule.  We discussed the importance of diagnosis and staging in lung malignancies, and the approach to obtaining a tissue diagnosis which would include robotic assisted navigational bronchoscopy with endobronchial ultrasound guided sampling.  We also discussed the risks associated with the procedure which include a 2% risk of pneumothorax, infection, bleeding, and nondiagnostic procedure in detail.  I explained that patients typically are able to return home the same day of the procedure, but in rare cases admission to the hospital for observation and treatment is required.  After our discussion, the patient elected to proceed with the procedure  Recommendations:  - Procedural/ Surgical Case Request: ROBOTIC ASSISTED NAVIGATIONAL BRONCHOSCOPY and Endobronchial Ultrasound - CT Super D Chest Wo Contrast; Future   No follow-ups on file.  I spent 45 minutes caring for this patient today, including preparing to see the patient, obtaining a medical history , reviewing a separately obtained history, performing a medically appropriate examination and/or evaluation, counseling and educating the patient/family/caregiver, ordering medications, tests, or procedures, documenting clinical information in the  electronic health record, and independently interpreting results (not separately reported/billed) and communicating results to the patient/family/caregiver  Erica Chute, MD [facility] 01/13/2023 4:10 PM    End of visit medications:  No orders of the defined types were placed in this encounter.    Current Outpatient Medications:    albuterol (VENTOLIN HFA) 108 (90 Base) MCG/ACT inhaler, Inhale 2 puffs into the lungs every 6 (six) hours as needed for wheezing or shortness of breath. , Disp: , Rfl:    aspirin EC 81 MG EC tablet, Take 1 tablet (81 mg total) by mouth daily., Disp: 30 tablet, Rfl: 0   Blood Glucose Monitoring Suppl (ACCU-CHEK AVIVA PLUS) w/Device KIT, See admin instructions., Disp: , Rfl: 0   busPIRone (BUSPAR) 30 MG tablet, Take 30 mg by mouth 2 (two) times daily. PER PT STATES HER DOSE WAS INCREASED, Disp: , Rfl:    clonazePAM (KLONOPIN) 0.5 MG tablet, Take 0.5 mg by mouth at bedtime as needed., Disp: , Rfl:    diltiazem (CARDIZEM CD) 120 MG 24 hr capsule, TAKE 1 CAPSULE BY MOUTH EVERY DAY, Disp: 60 capsule, Rfl: 1   diltiazem (CARDIZEM) 30 MG tablet, TAKE 1 TABLET(30 MG) BY MOUTH THREE TIMES DAILY AS NEEDED, Disp: 270 tablet, Rfl: 0   DULoxetine (CYMBALTA) 60 MG capsule, Take 60 mg by mouth 2 (two) times daily. , Disp: , Rfl:    fluconazole (DIFLUCAN) 150 MG tablet, Take 150 mg by mouth once., Disp: , Rfl:    Fluticasone-Salmeterol (ADVAIR) 250-50 MCG/DOSE AEPB, Inhale 1 puff into the lungs 2 (two) times daily., Disp: , Rfl:    furosemide (LASIX) 40 MG tablet, TAKE 1 TABLET BY MOUTH EVERY DAY. MAY TAKE AN ADDITIONAL TABLET IN THE AM FOR WEIGHT GAIN AS NEEDED, Disp:  110 tablet, Rfl: 0   JARDIANCE 25 MG TABS tablet, Take 25 mg by mouth in the morning and at bedtime., Disp: , Rfl:    Magnesium 500 MG CAPS, once daily., Disp: , Rfl:    metFORMIN (GLUCOPHAGE-XR) 500 MG 24 hr tablet, Take 1,000 mg by mouth 2 (two) times daily., Disp: , Rfl:    OXYGEN, Inhale  2 L into the lungs at bedtime. , Disp: , Rfl:    pantoprazole (PROTONIX) 40 MG tablet, Take 1 tablet (40 mg total) by mouth daily., Disp: 30 tablet, Rfl: 0   potassium chloride SA (KLOR-CON) 20 MEQ tablet, Take 20 mEq by mouth 2 (two) times daily., Disp: , Rfl:    rosuvastatin (CRESTOR) 20 MG tablet, Take 20 mg by mouth daily., Disp: , Rfl:    spironolactone (ALDACTONE) 25 MG tablet, TAKE 1 TABLET(25 MG) BY MOUTH EVERY MORNING, Disp: 90 tablet, Rfl: 0   sucralfate (CARAFATE) 1 G tablet, Take 1 tablet (1 g total) by mouth 4 (four) times daily -  with meals and at bedtime. (Patient taking differently: Take 1 g by mouth 2 (two) times daily.), Disp: 120 tablet, Rfl: 0   tiotropium (SPIRIVA) 18 MCG inhalation capsule, Place 18 mcg into inhaler and inhale daily., Disp: , Rfl:    tiZANidine (ZANAFLEX) 4 MG tablet, Take 4 mg by mouth 3 (three) times daily as needed., Disp: , Rfl:    zolpidem (AMBIEN) 10 MG tablet, Take 10 mg by mouth at bedtime as needed., Disp: , Rfl:    Cholecalciferol (VITAMIN D3) 50 MCG (2000 UT) capsule, Take 1 capsule (2,000 Units total) by mouth daily. Take 1 capsule (2,000 Units total) by mouth daily., Disp: 30 capsule, Rfl: 2   pregabalin (LYRICA) 150 MG capsule, Take 1 capsule (150 mg total) by mouth 3 (three) times daily., Disp: 90 capsule, Rfl: 2   Subjective:   PATIENT ID: Erica [name] GENDER: female DOB: 1966/01/21, MRN: [id number]  Chief Complaint  Patient presents with   pulmonary consult    CT 6/22-SOB with exertion, prod cough with thick green sputum and occ wheezing.    HPI  Patient is a pleasant 57 year old female recently diagnosed with rectal cancer who is presenting to clinic for the evaluation of a pulmonary nodule.  Patient was initially seen by Dr. Tobi [name] from GI for the chief complaint of rectal bleeding and change in bowel habits for which she underwent urgent colonoscopy. Colonoscopy performed 12/15/2022 showed  a fungating, partially obstructing large  mass in the rectum. This was biopsied and pathology showed invasive adenocarcinoma. She was referred to oncology and seen by Dr. Cathie [name]. Imaging included an MRI of the pelvis showing a 9.2 cm mid/lower rectal mass and a CT scan of the chest that showed a 2.2 cm left upper lobe nodule. Patient is referred to pulmonary for an evaluation.  She reports a history of COPD for which she is maintained on Advair and Spiriva. She was previously followed by Dr. Meredeth Ide at [facility] (last seen 05/2020) for COPD. She reports exertional dyspnea and a cough. No chest pain or chest tightness reported, no wheezing. Patient has had some weight loss.  Patient has a long standing history of smoking, with 46 pack years of smoking history. She smokes 1 pack a day.  Ancillary information including prior medications, full medical/surgical/family/social histories, and PFTs (when available) are listed below and have been reviewed.   Review of Systems  Constitutional:  Positive for malaise/fatigue and weight loss.  Negative for chills and fever.  Respiratory:  Positive for cough, sputum production and shortness of breath.   Cardiovascular:  Negative for chest pain.     Objective:   Vitals:   01/13/23 1511  BP: 110/68  Pulse: (!) 105  Temp: 97.8 F (36.6 C)  TempSrc: Temporal  SpO2: 98%  Weight: 169 lb 6.4 oz (76.8 kg)  Height: 5\' 8"  (1.727 m)   98% on RA  BMI Readings from Last 3 Encounters:  01/13/23 25.76 kg/m  12/23/22 24.83 kg/m  12/16/22 24.33 kg/m   Wt Readings from Last 3 Encounters:  01/13/23 169 lb 6.4 oz (76.8 kg)  12/23/22 163 lb 4.8 oz (74.1 kg)  12/16/22 160 lb (72.6 kg)    Physical Exam Constitutional:      Appearance: Normal appearance. She is not ill-appearing.  HENT:     Head: Normocephalic.     Nose: Nose normal.     Mouth/Throat:     Mouth: Mucous membranes are moist.  Cardiovascular:     Rate and Rhythm: Normal rate and regular rhythm.     Pulses: Normal pulses.      Heart sounds: Normal heart sounds.  Pulmonary:     Effort: Pulmonary effort is normal.     Breath sounds: Normal breath sounds.  Abdominal:     General: There is distension.     Palpations: Abdomen is soft.  Neurological:     General: No focal deficit present.     Mental Status: She is alert and oriented to person, place, and time. Mental status is at baseline.     Ancillary Information    Past Medical History:  Diagnosis Date   (HFpEF) heart failure with preserved ejection fraction (HCC)    a. 05/2015 Echo: EF 60-65%, no rwma, PASP .   Acute pancreatitis 08/13/2018   Arthritis    Asthma    Bell's palsy    Bronchitis    CHF (congestive heart failure) (HCC)    COPD (chronic obstructive pulmonary disease) (HCC)    Diabetes mellitus without complication (HCC)    type II 03/2017   Gastric ulcer    Hyperlipidemia    Hypertension    OSA (obstructive sleep apnea)    a. did not tolerate CPAP.   Pancreatitis    07/2018   Shoulder injury    6/19     Family History  Problem Relation Age of Onset   Breast cancer Mother        early 31's   Lung cancer Father    Brain cancer Father    Bone cancer Father    Diabetes Maternal Grandmother    Asthma Other    Colon cancer Maternal Aunt      Past Surgical History:  Procedure Laterality Date   COLONOSCOPY WITH PROPOFOL N/A 12/15/2022   Procedure: COLONOSCOPY WITH PROPOFOL;  Surgeon: Wyline Mood, MD;  Location[facility] ENDOSCOPY;  Service: Gastroenterology;  Laterality: N/A;   COLONOSCOPY WITH PROPOFOL N/A 12/16/2022   Procedure: COLONOSCOPY WITH PROPOFOL;  Surgeon: Wyline Mood, MD;  Location: [facility] ENDOSCOPY;  Service: Gastroenterology;  Laterality: N/A;   FLEXIBLE BRONCHOSCOPY N/A 06/20/2015   Procedure: FLEXIBLE BRONCHOSCOPY;  Surgeon: Shane Crutch, MD;  Location: [facility] ORS;  Service: Pulmonary;  Laterality: N/A;   KNEE SURGERY Left    8 knee surgeries    Social History   Socioeconomic History   Marital status:  Widowed    Spouse name: Not on file   Number of children: Not on file  Years of education: Not on file   Highest education level: Not on file  Occupational History   Occupation: unemployed  Tobacco Use   Smoking status: Every Day    Packs/day: 1.00    Years: 46.00    Additional pack years: 0.00    Total pack years: 46.00    Types: Cigarettes   Smokeless tobacco: Never   Tobacco comments:    0.5PPD 01/13/2023  Vaping Use   Vaping Use: Never used  Substance and Sexual Activity   Alcohol use: Yes    Comment: occasionally   Drug use: No   Sexual activity: Not Currently  Other Topics Concern   Not on file  Social History Narrative   Not on file   Social Determinants of Health   Financial Resource Strain: High Risk (07/18/2017)   Overall Financial Resource Strain (CARDIA)    Difficulty of Paying Living Expenses: Very hard  Food Insecurity: Food Insecurity Present (12/23/2022)   Hunger Vital Sign    Worried About Running Out of Food in the Last Year: Sometimes true    Ran Out of Food in the Last Year: Sometimes true  Transportation Needs: No Transportation Needs (12/23/2022)   PRAPARE - Administrator, Civil Service (Medical): No    Lack of Transportation (Non-Medical): No  Physical Activity: Insufficiently Active (07/18/2017)   Exercise Vital Sign    Days of Exercise per Week: 5 days    Minutes of Exercise per Session: 20 min  Stress: Stress Concern Present (07/18/2017)   [employer] of Occupational Health - Occupational Stress Questionnaire    Feeling of Stress : Very much  Social Connections: Socially Isolated (07/18/2017)   Social Connection and Isolation Panel [NHANES]    Frequency of Communication with Friends and Family: Twice a week    Frequency of Social Gatherings with Friends and Family: Never    Attends Religious Services: Never    Database administrator or Organizations: No    Attends Banker Meetings: Never    Marital Status:  Divorced  Catering manager Violence: Not At Risk (12/23/2022)   Humiliation, Afraid, Rape, and Kick questionnaire    Fear of Current or Ex-Partner: No    Emotionally Abused: No    Physically Abused: No    Sexually Abused: No     Allergies  Allergen Reactions   Tramadol Other (See Comments) and Palpitations    Heart Skipping Beats Heart palpatations   Penicillins Rash    Has patient had a PCN reaction causing immediate rash, facial/tongue/throat swelling, SOB or lightheadedness with hypotension: Yes Has patient had a PCN reaction causing severe rash involving mucus membranes or skin necrosis: No Has patient had a PCN reaction that required hospitalization: No Has patient had a PCN reaction occurring within the last 10 years: No If all of the above answers are "NO", then may proceed with Cephalosporin use.     CBC    Component Value Date/Time   WBC 12.6 (H) 12/23/2022 1607   WBC 9.6 04/22/2022 1752   RBC 4.66 12/23/2022 1607   HGB 13.8 12/23/2022 1607   HGB 13.9 12/09/2022 1515   HCT 42.1 12/23/2022 1607   HCT 41.4 12/09/2022 1515   PLT 284 12/23/2022 1607   PLT 288 12/09/2022 1515   MCV 90.3 12/23/2022 1607   MCV 91 12/09/2022 1515   MCV 102 (H) 02/26/2014 1826   MCH 29.6 12/23/2022 1607   MCHC 32.8 12/23/2022 1607   RDW 14.6 12/23/2022  1607   RDW 14.1 12/09/2022 1515   RDW 14.3 02/26/2014 1826   LYMPHSABS 3.0 12/23/2022 1607   LYMPHSABS 2.4 12/09/2022 1515   MONOABS 0.9 12/23/2022 1607   EOSABS 0.4 12/23/2022 1607   EOSABS 0.5 (H) 12/09/2022 1515   BASOSABS 0.1 12/23/2022 1607   BASOSABS 0.1 12/09/2022 1515    Pulmonary Functions Testing Results:     No data to display          Outpatient Medications Prior to Visit  Medication Sig Dispense Refill   albuterol (VENTOLIN HFA) 108 (90 Base) MCG/ACT inhaler Inhale 2 puffs into the lungs every 6 (six) hours as needed for wheezing or shortness of breath.      aspirin EC 81 MG EC tablet Take 1 tablet (81 mg total)  by mouth daily. 30 tablet 0   Blood Glucose Monitoring Suppl (ACCU-CHEK AVIVA PLUS) w/Device KIT See admin instructions.  0   busPIRone (BUSPAR) 30 MG tablet Take 30 mg by mouth 2 (two) times daily. PER PT STATES HER DOSE WAS INCREASED     clonazePAM (KLONOPIN) 0.5 MG tablet Take 0.5 mg by mouth at bedtime as needed.     diltiazem (CARDIZEM CD) 120 MG 24 hr capsule TAKE 1 CAPSULE BY MOUTH EVERY DAY 60 capsule 1   diltiazem (CARDIZEM) 30 MG tablet TAKE 1 TABLET(30 MG) BY MOUTH THREE TIMES DAILY AS NEEDED 270 tablet 0   DULoxetine (CYMBALTA) 60 MG capsule Take 60 mg by mouth 2 (two) times daily.      fluconazole (DIFLUCAN) 150 MG tablet Take 150 mg by mouth once.     Fluticasone-Salmeterol (ADVAIR) 250-50 MCG/DOSE AEPB Inhale 1 puff into the lungs 2 (two) times daily.     furosemide (LASIX) 40 MG tablet TAKE 1 TABLET BY MOUTH EVERY DAY. MAY TAKE AN ADDITIONAL TABLET IN THE AM FOR WEIGHT GAIN AS NEEDED 110 tablet 0   JARDIANCE 25 MG TABS tablet Take 25 mg by mouth in the morning and at bedtime.     Magnesium 500 MG CAPS once daily.     metFORMIN (GLUCOPHAGE-XR) 500 MG 24 hr tablet Take 1,000 mg by mouth 2 (two) times daily.     OXYGEN Inhale 2 L into the lungs at bedtime.      pantoprazole (PROTONIX) 40 MG tablet Take 1 tablet (40 mg total) by mouth daily. 30 tablet 0   potassium chloride SA (KLOR-CON) 20 MEQ tablet Take 20 mEq by mouth 2 (two) times daily.     rosuvastatin (CRESTOR) 20 MG tablet Take 20 mg by mouth daily.     spironolactone (ALDACTONE) 25 MG tablet TAKE 1 TABLET(25 MG) BY MOUTH EVERY MORNING 90 tablet 0   sucralfate (CARAFATE) 1 G tablet Take 1 tablet (1 g total) by mouth 4 (four) times daily -  with meals and at bedtime. (Patient taking differently: Take 1 g by mouth 2 (two) times daily.) 120 tablet 0   tiotropium (SPIRIVA) 18 MCG inhalation capsule Place 18 mcg into inhaler and inhale daily.     tiZANidine (ZANAFLEX) 4 MG tablet Take 4 mg by mouth 3 (three) times daily as needed.      zolpidem (AMBIEN) 10 MG tablet Take 10 mg by mouth at bedtime as needed.     Cholecalciferol (VITAMIN D3) 50 MCG (2000 UT) capsule Take 1 capsule (2,000 Units total) by mouth daily. Take 1 capsule (2,000 Units total) by mouth daily. 30 capsule 2   pregabalin (LYRICA) 150 MG capsule Take 1 capsule (  150 mg total) by mouth 3 (three) times daily. 90 capsule 2   amoxicillin-clavulanate (AUGMENTIN) 875-125 MG tablet Take 1 tablet by mouth 2 (two) times daily. 10 tablet 0   predniSONE (STERAPRED UNI-PAK 21 TAB) 10 MG (21) TBPK tablet Day 1 take 6 tablets, Day 2 take 5 tablets, Day 3 take 4 tablets, Day 4 take 3 tablets, Day 5 take 2 tablets D6 take 1 tablet 1 each 0   No facility-administered medications prior to visit.

## 2023-01-14 ENCOUNTER — Telehealth: Payer: Self-pay

## 2023-01-14 NOTE — Telephone Encounter (Signed)
Per Dr. Aundria Rud- patient is scheduled for robotic bronchoscopy 01/27/2023 at 9:15am.  Patient is aware of date/time and location. She is aware to arrive at 7:15 and NPO after midnight. Nothing further needed.

## 2023-01-17 ENCOUNTER — Encounter: Payer: Self-pay | Admitting: Student in an Organized Health Care Education/Training Program

## 2023-01-17 ENCOUNTER — Telehealth: Payer: Self-pay

## 2023-01-17 NOTE — Telephone Encounter (Signed)
-----   Message from Rickard Patience, MD sent at 01/15/2023  9:36 PM EDT ----- Please arrange him to see me 7-10 days after his bronchoscopy on 7/17

## 2023-01-24 ENCOUNTER — Other Ambulatory Visit: Payer: Self-pay

## 2023-01-24 ENCOUNTER — Encounter (HOSPITAL_COMMUNITY): Payer: Self-pay | Admitting: Student in an Organized Health Care Education/Training Program

## 2023-01-24 NOTE — Progress Notes (Signed)
CT Chest x-ray - 01/03/23 EKG - 02/02/22 Stress Test - 10/21/17 ECHO - 09/11/18 Cardiac Cath - n/a  ICD Pacemaker/Loop - n/a  Sleep Study -  Yes CPAP - does not use CPAP, ON Oxygen 2L via [place] @ night and PRN  Diabetes Type 2  Hold Jardiance 72 hours prior to surgery.  Last dose was on 01/24/23  Do not take Metformin on the morning of surgery.  If your blood sugar is less than 70 mg/dL, you will need to treat for low blood sugar: Treat a low blood sugar (less than 70 mg/dL) with  cup of clear juice (cranberry or apple), 4 glucose tablets, OR glucose gel. Recheck blood sugar in 15 minutes after treatment (to make sure it is greater than 70 mg/dL). If your blood sugar is not greater than 70 mg/dL on recheck, call [phone] for further instructions.  NPO  Anesthesia review: Yes  STOP now taking any Aspirin (unless otherwise instructed by your surgeon), Aleve, Naproxen, Ibuprofen, Motrin, Advil, Goody's, BC's, all herbal medications, fish oil, and all vitamins.   Coronavirus Screening Do you have any of the following symptoms:  Cough yes/no: Yes Fever (>100.54F)  yes/no: No Runny nose yes/no: No Sore throat yes/no: No Difficulty breathing/shortness of breath  yes/no: Yes  Have you traveled in the last 14 days and where? yes/no: No  Patient verbalized understanding of instructions that were given via phone.

## 2023-01-25 ENCOUNTER — Telehealth: Payer: Self-pay | Admitting: Student in an Organized Health Care Education/Training Program

## 2023-01-25 NOTE — Telephone Encounter (Signed)
Needs Surgical orders for bronch procedure

## 2023-01-25 NOTE — Progress Notes (Signed)
Anesthesia Chart Review: Erica [name]  Case: [id number] Date/Time: 01/27/23 0915   Procedure: ROBOTIC ASSISTED NAVIGATIONAL BRONCHOSCOPY (Bilateral)   Anesthesia type: General   Diagnosis: Lung nodule [R91.1]   Pre-op diagnosis: lung nodule, history of colon cancer   [facility] / [facility]   Surgeons: Raechel Chute, MD       DISCUSSION: Patient is a 57 year old female scheduled for the above procedure. Procedure recommended for LUL pulmonary nodule found in the setting of primary rectal adenocarcinoma and smoking.  History includes smoking, COPD (home O2 2L at night & as needed), OSA (intolerant to CPAP, uses 2L O2), asthma, pulmonary nodule, HTN, HLD, DM2, HFpEF, dyspnea, anxiety, fatty liver, pancreatitis (07/2018), rectal cancer (12/15/22 colonoscopy with biopsy for hematochezia and weight loss + adenocarcinoma).   She is established with cardiologist Dr. Mariah Milling since 05/2015 during admission for edema, dyspnea, hyponatremia, demand ischemia, elevated pulmonary pressures (PA peak pressure 56 mmHg, normal LVEF) in setting of COPD exacerbation, LUL PNA. Diagnosed with chronic diastolic CHF. Last stress test in 10/21/2017 was normal. Echo on 09/11/2018 showed LVEF 55-60%, mild LVH, impaired relaxation, normal RVSF, no significant valvular disease. Pulmonary HTN "Improved on echo 2020". On Lasix. Last cardiology visit 08/18/22 with Eligha Bridegroom, NP for virtual preoperative evaluation prior to colonoscopy, No new testing ordered at that time.  A1c 5.5% 07/2022. On metformin, Jardiance. Last Jardiance 01/24/23.   She has a CT Super D Chest scheduled for 01/26/23. Anesthesia team to evaluate on the day of surgery. ASA on hold per pulmonary instructions.   VS: Ht 5\' 8"  (1.727 m)   Wt 76.8 kg   LMP 03/13/2015 (Approximate) Comment: more than 2 years ago  BMI 25.74 kg/m  BP Readings from Last 3 Encounters:  01/13/23 110/68  12/23/22 104/71  12/16/22 135/75   Pulse  Readings from Last 3 Encounters:  01/13/23 (!) 105  12/23/22 (!) 105  12/16/22 72     PROVIDERS: [facility] Julien Nordmann, MD is cardiologist  Raechel Chute, MD is pulmonologist Rickard Patience, MD is HEM-ONC Wyline Mood, MD is GI Delano Metz, MD is Pain Management   LABS: Most recent lab results in [facility] include: Lab Results  Component Value Date   WBC 12.6 (H) 12/23/2022   HGB 13.8 12/23/2022   HCT 42.1 12/23/2022   PLT 284 12/23/2022   GLUCOSE 83 12/23/2022   ALT 11 12/23/2022   AST 17 12/23/2022   NA 139 12/23/2022   K 3.8 12/23/2022   CL 107 12/23/2022   CREATININE 0.60 12/23/2022   BUN 9 12/23/2022   CO2 23 12/23/2022  A1c 5.5% on 08/05/22 (Care Everywhere)   IMAGES: CT Super D Chest 01/26/23: Pending.  CT Chest/abd/pelvis 01/03/23: IMPRESSION: 2.2 cm left upper lobe nodule, suspicious for metastasis. No findings suspicious for metastatic disease in the abdomen. Bilateral adrenal adenomas, benign. Cholelithiasis, without associated inflammatory changes. Aortic Atherosclerosis (ICD10-I70.0) and Emphysema (ICD10-J43.9).  MRI Pelvis 01/03/23: IMPRESSION: - 9.2 cm concentric mid/lower rectal mass, corresponding to the patient's known primary rectal adenocarcinoma, as above. - Rectal adenocarcinoma T stage: T3c - Rectal adenocarcinoma N stage:  N2 - Distance from tumor to the internal anal sphincter is 3.4 cm.   EKG: 02/03/22: NSR   CV: Echo 09/11/18: IMPRESSIONS   1. The left ventricle has normal systolic function, with an ejection  fraction of 55-60%. The cavity size was normal. There is mild concentric  left ventricular hypertrophy. Left ventricular diastolic Doppler  parameters are  consistent with impaired  relaxation.   2. The right ventricle has normal systolic function. The cavity was  normal. There is no increase in right ventricular wall thickness.   3. The mitral valve is normal in structure.   4. The tricuspid valve  is normal in structure.   5. The aortic valve is tricuspid.   6. The pulmonic valve was normal in structure.  - Comparison echo 06/14/15 in setting of acute COPD/PNA: LVEF 60-65%, no RWMA, normal diastolic function parameters, normal RVF, PA peak pressure 56 mmHg  Nuclear stress test 10/21/17: There was no ST segment deviation noted during stress. The study is normal. This is a low risk study. The left ventricular ejection fraction is normal (55-65%).   Past Medical History:  Diagnosis Date   (HFpEF) heart failure with preserved ejection fraction (HCC)    a. 05/2015 Echo: EF 60-65%, no rwma, PASP .   Acute pancreatitis 08/13/2018   Anxiety    Arthritis    Asthma    Bell's palsy    no deficit   Bronchitis    Cancer (HCC) 12/2022   rectal cancer   CHF (congestive heart failure) (HCC)    COPD (chronic obstructive pulmonary disease) (HCC)    Depression    Diabetes mellitus without complication (HCC)    type II 03/2017   Dyspnea    Fatty liver    per patient   Gall stones    per patient   Gastric ulcer    Hyperlipidemia    Hypertension    Lung nodule 12/2022   upper left   OSA (obstructive sleep apnea)    a. did not tolerate CPAP. On oxygen 2L via [place]   Pancreatitis    07/2018   Shoulder injury    6/19   Smoker     Past Surgical History:  Procedure Laterality Date   COLONOSCOPY WITH PROPOFOL N/A 12/15/2022   Procedure: COLONOSCOPY WITH PROPOFOL;  Surgeon: Wyline Mood, MD;  Location: [facility] ENDOSCOPY;  Service: Gastroenterology;  Laterality: N/A;   COLONOSCOPY WITH PROPOFOL N/A 12/16/2022   Procedure: COLONOSCOPY WITH PROPOFOL;  Surgeon: Wyline Mood, MD;  Location[facility] ENDOSCOPY;  Service: Gastroenterology;  Laterality: N/A;   FLEXIBLE BRONCHOSCOPY N/A 06/20/2015   Procedure: FLEXIBLE BRONCHOSCOPY;  Surgeon: Shane Crutch, MD;  Location: [facility] ORS;  Service: Pulmonary;  Laterality: N/A;   KNEE SURGERY Left    8 knee surgeries    MEDICATIONS: No current  facility-administered medications for this encounter.    albuterol (VENTOLIN HFA) 108 (90 Base) MCG/ACT inhaler   aspirin EC 81 MG EC tablet   busPIRone (BUSPAR) 30 MG tablet   clonazePAM (KLONOPIN) 0.5 MG tablet   diltiazem (CARDIZEM CD) 120 MG 24 hr capsule   diltiazem (CARDIZEM) 30 MG tablet   DULoxetine (CYMBALTA) 60 MG capsule   Fluticasone-Salmeterol (ADVAIR) 250-50 MCG/DOSE AEPB   furosemide (LASIX) 40 MG tablet   JARDIANCE 25 MG TABS tablet   metFORMIN (GLUCOPHAGE-XR) 500 MG 24 hr tablet   OXYGEN   pantoprazole (PROTONIX) 40 MG tablet   potassium chloride SA (KLOR-CON) 20 MEQ tablet   pregabalin (LYRICA) 150 MG capsule   rosuvastatin (CRESTOR) 20 MG tablet   spironolactone (ALDACTONE) 25 MG tablet   sucralfate (CARAFATE) 1 G tablet   tiotropium (SPIRIVA) 18 MCG inhalation capsule   tiZANidine (ZANAFLEX) 4 MG tablet   zolpidem (AMBIEN) 10 MG tablet   Blood Glucose Monitoring Suppl (ACCU-CHEK AVIVA PLUS) w/Device KIT    Shonna Chock, PA-C Surgical Short  Stay/Anesthesiology [facility] Phone [phone] [facility] Phone [phone] 01/25/2023 9:59 AM

## 2023-01-25 NOTE — Anesthesia Preprocedure Evaluation (Signed)
Anesthesia Evaluation  Patient identified by MRN, date of birth, ID band Patient awake    Reviewed: Allergy & Precautions, NPO status , Patient's Chart, lab work & pertinent test results  History of Anesthesia Complications Negative for: history of anesthetic complications  Airway Mallampati: III  TM Distance: >3 FB Neck ROM: Full    Dental  (+) Edentulous Upper, Edentulous Lower, Dental Advisory Given   Pulmonary asthma , sleep apnea (2L) and Oxygen sleep apnea , COPD (2L at night and if she exerts herself),  oxygen dependent, neg recent URI, Current Smoker Lung nodule   Pulmonary exam normal breath sounds clear to auscultation       Cardiovascular hypertension, Pt. on medications pulmonary hypertension(-) angina +CHF (EF 55-60%)  (-) Past MI, (-) Cardiac Stents and (-) CABG (-) dysrhythmias  Rhythm:Regular Rate:Normal  HLD  TTE 09/11/2018: IMPRESSIONS    1. The left ventricle has normal systolic function, with an ejection  fraction of 55-60%. The cavity size was normal. There is mild concentric  left ventricular hypertrophy. Left ventricular diastolic Doppler  parameters are consistent with impaired  relaxation.   2. The right ventricle has normal systolic function. The cavity was  normal. There is no increase in right ventricular wall thickness.   3. The mitral valve is normal in structure.   4. The tricuspid valve is normal in structure.   5. The aortic valve is tricuspid.   6. The pulmonic valve was normal in structure.   Normal stress test 10/21/2017:     Neuro/Psych neg Seizures PSYCHIATRIC DISORDERS (PTSD) Anxiety Depression    vertigo  Neuromuscular disease (Bell's palsy, lumbar spondylosis, retrolisthesis of c-spine)    GI/Hepatic PUD,GERD  Medicated,,Fatty liver Rectal cancer   Endo/Other  diabetes, Type 2, Oral Hypoglycemic Agents    Renal/GU Renal disease     Musculoskeletal  (+) Arthritis ,     Abdominal   Peds  Hematology negative hematology ROS (+)   Anesthesia Other Findings Patient reports having thrush that started yesterday. Tongue appears yellow. Discussed with Dr. Aundria Rud, ok to proceed.  Reproductive/Obstetrics                             Anesthesia Physical Anesthesia Plan  ASA: 3  Anesthesia Plan: General   Post-op Pain Management: Tylenol PO (pre-op)*   Induction: Intravenous  PONV Risk Score and Plan: 2 and Ondansetron, Dexamethasone and Treatment may vary due to age or medical condition  Airway Management Planned: Oral ETT  Additional Equipment:   Intra-op Plan:   Post-operative Plan: Extubation in OR  Informed Consent: I have reviewed the patients History and Physical, chart, labs and discussed the procedure including the risks, benefits and alternatives for the proposed anesthesia with the patient or authorized representative who has indicated his/her understanding and acceptance.     Dental advisory given  Plan Discussed with: CRNA and Anesthesiologist  Anesthesia Plan Comments: (PAT note written 01/25/2023 by Shonna Chock, PA-C.  Risks of general anesthesia discussed including, but not limited to, sore throat, hoarse voice, chipped/damaged teeth, injury to vocal cords, nausea and vomiting, allergic reactions, lung infection, heart attack, stroke, and death. All questions answered.   )       Anesthesia Quick Evaluation

## 2023-01-25 NOTE — Telephone Encounter (Signed)
Orders are in. Okey Regal with [facility] pre admit is aware. Nothing further needed

## 2023-01-25 NOTE — Telephone Encounter (Signed)
Message sent to Dr. Aundria Rud to make him aware.

## 2023-01-26 ENCOUNTER — Ambulatory Visit
Admission: RE | Admit: 2023-01-26 | Discharge: 2023-01-26 | Disposition: A | Discharge status: Home / Self Care | Payer: Medicaid Other | Source: Ambulatory Visit | Attending: Student in an Organized Health Care Education/Training Program | Admitting: Student in an Organized Health Care Education/Training Program

## 2023-01-26 DIAGNOSIS — R911 Solitary pulmonary nodule: Secondary | ICD-10-CM | POA: Insufficient documentation

## 2023-01-27 ENCOUNTER — Ambulatory Visit (HOSPITAL_COMMUNITY)
Admission: RE | Admit: 2023-01-27 | Discharge: 2023-01-27 | Disposition: A | Discharge status: Home / Self Care | Payer: Medicaid Other | Attending: Student in an Organized Health Care Education/Training Program | Admitting: Student in an Organized Health Care Education/Training Program

## 2023-01-27 ENCOUNTER — Ambulatory Visit (HOSPITAL_COMMUNITY): Payer: Medicaid Other | Admitting: Vascular Surgery

## 2023-01-27 ENCOUNTER — Other Ambulatory Visit: Payer: Self-pay | Admitting: Student in an Organized Health Care Education/Training Program

## 2023-01-27 ENCOUNTER — Ambulatory Visit (HOSPITAL_COMMUNITY): Payer: Medicaid Other

## 2023-01-27 ENCOUNTER — Encounter (HOSPITAL_COMMUNITY)
Admission: RE | Disposition: A | Discharge status: Home / Self Care | Payer: Self-pay | Source: Home / Self Care | Attending: Student in an Organized Health Care Education/Training Program

## 2023-01-27 ENCOUNTER — Encounter (HOSPITAL_COMMUNITY): Payer: Self-pay | Admitting: Student in an Organized Health Care Education/Training Program

## 2023-01-27 ENCOUNTER — Ambulatory Visit (HOSPITAL_BASED_OUTPATIENT_CLINIC_OR_DEPARTMENT_OTHER): Payer: Medicaid Other | Admitting: Vascular Surgery

## 2023-01-27 ENCOUNTER — Other Ambulatory Visit: Payer: Self-pay

## 2023-01-27 DIAGNOSIS — C20 Malignant neoplasm of rectum: Secondary | ICD-10-CM

## 2023-01-27 DIAGNOSIS — Z9981 Dependence on supplemental oxygen: Secondary | ICD-10-CM | POA: Diagnosis not present

## 2023-01-27 DIAGNOSIS — I110 Hypertensive heart disease with heart failure: Secondary | ICD-10-CM | POA: Diagnosis not present

## 2023-01-27 DIAGNOSIS — E785 Hyperlipidemia, unspecified: Secondary | ICD-10-CM | POA: Insufficient documentation

## 2023-01-27 DIAGNOSIS — G4733 Obstructive sleep apnea (adult) (pediatric): Secondary | ICD-10-CM | POA: Diagnosis not present

## 2023-01-27 DIAGNOSIS — C3412 Malignant neoplasm of upper lobe, left bronchus or lung: Secondary | ICD-10-CM | POA: Diagnosis present

## 2023-01-27 DIAGNOSIS — E119 Type 2 diabetes mellitus without complications: Secondary | ICD-10-CM | POA: Diagnosis not present

## 2023-01-27 DIAGNOSIS — Z7984 Long term (current) use of oral hypoglycemic drugs: Secondary | ICD-10-CM | POA: Diagnosis not present

## 2023-01-27 DIAGNOSIS — I5032 Chronic diastolic (congestive) heart failure: Secondary | ICD-10-CM | POA: Diagnosis not present

## 2023-01-27 DIAGNOSIS — F418 Other specified anxiety disorders: Secondary | ICD-10-CM | POA: Diagnosis not present

## 2023-01-27 DIAGNOSIS — F17210 Nicotine dependence, cigarettes, uncomplicated: Secondary | ICD-10-CM | POA: Insufficient documentation

## 2023-01-27 DIAGNOSIS — R911 Solitary pulmonary nodule: Secondary | ICD-10-CM | POA: Diagnosis not present

## 2023-01-27 DIAGNOSIS — Z79899 Other long term (current) drug therapy: Secondary | ICD-10-CM | POA: Diagnosis not present

## 2023-01-27 DIAGNOSIS — I509 Heart failure, unspecified: Secondary | ICD-10-CM

## 2023-01-27 DIAGNOSIS — J4489 Other specified chronic obstructive pulmonary disease: Secondary | ICD-10-CM | POA: Insufficient documentation

## 2023-01-27 DIAGNOSIS — B370 Candidal stomatitis: Secondary | ICD-10-CM

## 2023-01-27 HISTORY — DX: Anxiety disorder, unspecified: F41.9

## 2023-01-27 HISTORY — DX: Dyspnea, unspecified: R06.00

## 2023-01-27 HISTORY — DX: Calculus of gallbladder without cholecystitis without obstruction: K80.20

## 2023-01-27 HISTORY — DX: Nicotine dependence, unspecified, uncomplicated: F17.200

## 2023-01-27 HISTORY — DX: Fatty (change of) liver, not elsewhere classified: K76.0

## 2023-01-27 HISTORY — DX: Depression, unspecified: F32.A

## 2023-01-27 HISTORY — PX: BRONCHIAL NEEDLE ASPIRATION BIOPSY: SHX5106

## 2023-01-27 LAB — BASIC METABOLIC PANEL
Anion gap: 14 (ref 5–15)
BUN: 7 mg/dL (ref 6–20)
CO2: 21 mmol/L — ABNORMAL LOW (ref 22–32)
Calcium: 9.2 mg/dL (ref 8.9–10.3)
Chloride: 104 mmol/L (ref 98–111)
Creatinine, Ser: 0.67 mg/dL (ref 0.44–1.00)
GFR, Estimated: >60 mL/min (ref >60)
Glucose, Bld: 109 mg/dL — ABNORMAL HIGH (ref 70–99)
Potassium: 3.4 mmol/L — ABNORMAL LOW (ref 3.5–5.1)
Sodium: 139 mmol/L (ref 135–145)

## 2023-01-27 LAB — CBC
HCT: 45.9 % (ref 36.0–46.0)
Hemoglobin: 14.7 g/dL (ref 12.0–15.0)
MCH: 30.5 pg (ref 26.0–34.0)
MCHC: 32 g/dL (ref 30.0–36.0)
MCV: 95.2 fL (ref 80.0–100.0)
Platelets: 236 10*3/uL (ref 150–400)
RBC: 4.82 MIL/uL (ref 3.87–5.11)
RDW: 18.6 % — ABNORMAL HIGH (ref 11.5–15.5)
WBC: 8.4 10*3/uL (ref 4.0–10.5)
nRBC: 0 % (ref 0.0–0.2)

## 2023-01-27 LAB — GLUCOSE, CAPILLARY
Glucose-Capillary: 121 mg/dL — ABNORMAL HIGH (ref 70–99)
Glucose-Capillary: 150 mg/dL — ABNORMAL HIGH (ref 70–99)

## 2023-01-27 SURGERY — BRONCHOSCOPY, WITH BIOPSY USING ELECTROMAGNETIC NAVIGATION
Anesthesia: General | Laterality: Bilateral

## 2023-01-27 MED ORDER — PROPOFOL 10 MG/ML IV BOLUS
INTRAVENOUS | Status: DC | PRN
  Administered 2023-01-27: 50 mg via INTRAVENOUS
  Administered 2023-01-27: 150 mg via INTRAVENOUS
  Administered 2023-01-27: 20 mg via INTRAVENOUS

## 2023-01-27 MED ORDER — ACETAMINOPHEN 500 MG PO TABS
1000.0000 mg | ORAL_TABLET | Freq: Once | ORAL | Status: AC
  Administered 2023-01-27: 1000 mg via ORAL
  Filled 2023-01-27: qty 2

## 2023-01-27 MED ORDER — LACTATED RINGERS IV SOLN: INTRAVENOUS | Status: DC

## 2023-01-27 MED ORDER — INSULIN ASPART 100 UNIT/ML IJ SOLN: 0.0000 [IU] | INTRAMUSCULAR | Status: DC | PRN

## 2023-01-27 MED ORDER — OXYCODONE HCL 5 MG/5ML PO SOLN: 5.0000 mg | Freq: Once | ORAL | Status: DC | PRN

## 2023-01-27 MED ORDER — LIDOCAINE HCL (PF) 2 % IJ SOLN
INTRAMUSCULAR | Status: DC | PRN
  Administered 2023-01-27: 60 mg via INTRADERMAL

## 2023-01-27 MED ORDER — AMISULPRIDE (ANTIEMETIC) 5 MG/2ML IV SOLN: 10.0000 mg | Freq: Once | INTRAVENOUS | Status: DC | PRN

## 2023-01-27 MED ORDER — ONDANSETRON HCL 4 MG/2ML IJ SOLN
INTRAMUSCULAR | Status: DC | PRN
  Administered 2023-01-27: 4 mg via INTRAVENOUS

## 2023-01-27 MED ORDER — CLOTRIMAZOLE 10 MG MT TROC
10.0000 mg | Freq: Every day | OROMUCOSAL | 0 refills | Status: AC
Start: 2023-01-27 — End: 2023-02-06

## 2023-01-27 MED ORDER — OXYCODONE HCL 5 MG PO TABS: 5.0000 mg | ORAL_TABLET | Freq: Once | ORAL | Status: DC | PRN

## 2023-01-27 MED ORDER — CHLORHEXIDINE GLUCONATE 0.12 % MT SOLN
OROMUCOSAL | Status: AC
  Administered 2023-01-27: 15 mL
  Filled 2023-01-27: qty 15

## 2023-01-27 MED ORDER — PROPOFOL 500 MG/50ML IV EMUL
INTRAVENOUS | Status: DC | PRN
  Administered 2023-01-27: 100 ug/kg/min via INTRAVENOUS

## 2023-01-27 MED ORDER — DEXAMETHASONE SODIUM PHOSPHATE 10 MG/ML IJ SOLN
INTRAMUSCULAR | Status: DC | PRN
  Administered 2023-01-27: 10 mg via INTRAVENOUS

## 2023-01-27 MED ORDER — SUGAMMADEX SODIUM 200 MG/2ML IV SOLN
INTRAVENOUS | Status: DC | PRN
  Administered 2023-01-27: 200 mg via INTRAVENOUS

## 2023-01-27 MED ORDER — FENTANYL CITRATE (PF) 100 MCG/2ML IJ SOLN: 25.0000 ug | INTRAMUSCULAR | Status: DC | PRN

## 2023-01-27 MED ORDER — ROCURONIUM BROMIDE 100 MG/10ML IV SOLN
INTRAVENOUS | Status: DC | PRN
  Administered 2023-01-27: 50 mg via INTRAVENOUS
  Administered 2023-01-27: 20 mg via INTRAVENOUS

## 2023-01-27 NOTE — Anesthesia Procedure Notes (Signed)
Procedure Name: Intubation Date/Time: 01/27/2023 8:31 AM  Performed by: Alease Medina, CRNAPre-anesthesia Checklist: Patient identified, Emergency Drugs available, Suction available and Patient being monitored Patient Re-evaluated:Patient Re-evaluated prior to induction Oxygen Delivery Method: Circle system utilized Preoxygenation: Pre-oxygenation with 100% oxygen Induction Type: IV induction Ventilation: Mask ventilation without difficulty and Oral airway inserted - appropriate to patient size Laryngoscope Size: Mac and 3 Grade View: Grade I Tube type: Oral Tube size: 8.5 mm Number of attempts: 1 Airway Equipment and Method: Stylet and Oral airway Placement Confirmation: ETT inserted through vocal cords under direct vision, positive ETCO2 and breath sounds checked- equal and bilateral Secured at: 20 cm Tube secured with: Tape Dental Injury: Teeth and Oropharynx as per pre-operative assessment

## 2023-01-27 NOTE — Anesthesia Postprocedure Evaluation (Signed)
Anesthesia Post Note  Patient: Erica [name]  Procedure(s) Performed: ROBOTIC ASSISTED NAVIGATIONAL BRONCHOSCOPY (Bilateral) BRONCHIAL NEEDLE ASPIRATION BIOPSIES     Patient location during evaluation: PACU Anesthesia Type: General Level of consciousness: awake Pain management: pain level controlled Vital Signs Assessment: post-procedure vital signs reviewed and stable Respiratory status: spontaneous breathing, nonlabored ventilation and respiratory function stable Cardiovascular status: blood pressure returned to baseline and stable Postop Assessment: no apparent nausea or vomiting Anesthetic complications: no   No notable events documented.  Last Vitals:  Vitals:   01/27/23 0930 01/27/23 0945  BP: 91/67 116/70  Pulse: 87 91  Resp: 16 18  Temp:  36.9 C  SpO2: 98% 91%    Last Pain:  Vitals:   01/27/23 0925  TempSrc:   PainSc: 0-No pain                 Linton Rump

## 2023-01-27 NOTE — Transfer of Care (Signed)
Immediate Anesthesia Transfer of Care Note  Patient: Erica [name]  Procedure(s) Performed: ROBOTIC ASSISTED NAVIGATIONAL BRONCHOSCOPY (Bilateral) BRONCHIAL NEEDLE ASPIRATION BIOPSIES  Patient Location: PACU  Anesthesia Type:General  Level of Consciousness: drowsy and patient cooperative  Airway & Oxygen Therapy: Patient Spontanous Breathing  Post-op Assessment: Report given to RN, Post -op Vital signs reviewed and stable, and Patient moving all extremities X 4  Post vital signs: Reviewed and stable  Last Vitals:  Vitals Value Taken Time  BP 113/63 01/27/23 0925  Temp    Pulse 90 01/27/23 0927  Resp 24 01/27/23 0927  SpO2 94 % 01/27/23 0927  Vitals shown include unfiled device data.  Last Pain:  Vitals:   01/27/23 0726  TempSrc:   PainSc: 6          Complications: No notable events documented.

## 2023-01-27 NOTE — Interval H&P Note (Signed)
Patient seen and examined, no new respiratory symptoms reported. Reports oral thrush.  S: Vitals:   01/27/23 0705  BP: (!) 152/82  Pulse: 100  Resp: 20  Temp: 98.1 F (36.7 C)  SpO2: 98%   General: Well-appearing and in no distress. Well-nourished HEENT: Mucous membranes moist, no evidence of postnasal drip Respiratory: Trachea is midline, no respiratory distress, good bilateral air entry, no wheezes, rales, or rhonchi Cardiovascular: Heart with regular rate and rhythm, normal S1 and S2, no murmurs, rubs, or gallops Gastrointestinal: Normoactive bowel sounds, soft and nontender Neuro: Alert and oriented, no gross focal deficits  A/P: 57 year old female with recently diagnosed primary rectal adenocarcinoma. She is presenting for robotic assisted navigational bronchoscopy to the nodule to rule out metastasis vs primary lung malignancy. Patient is appropriate for the procedure.  Raechel Chute, MD [facility] 01/27/2023 8:07 AM

## 2023-01-27 NOTE — Op Note (Signed)
Video Bronchoscopy with Robotic Assisted Bronchoscopic Navigation   Date of Operation: 01/27/2023   Pre-op Diagnosis: rectal cancer, LUL nodule  Surgeon: Raechel Chute, MD  Anesthesia: General endotracheal anesthesia  Operation: Flexible video fiberoptic bronchoscopy with robotic assistance and biopsies.  Estimated Blood Loss: Minimal  Complications: None  Indications and History: Erica [name] is a 57 y.o. female with history of newly diagnosed rectal cancer found to have a LUL nodule presenting for robotic assisted navigational bronchoscopy to rule out metastasis. The risks, benefits, complications, treatment options and expected outcomes were discussed with the patient.  The possibilities of pneumothorax, pneumonia, reaction to medication, pulmonary aspiration, perforation of a viscus, bleeding, failure to diagnose a condition and creating a complication requiring transfusion or operation were discussed with the patient who freely signed the consent.    Description of Procedure: The patient was seen in the Preoperative Area, was examined and was deemed appropriate to proceed.  The patient was taken to [facility] endoscopy room 3, identified as Erica [name] and the procedure verified as Flexible Video Fiberoptic Bronchoscopy.  A Time Out was held and the above information confirmed.   Prior to the date of the procedure a high-resolution CT scan of the chest was performed. Utilizing ION software program a virtual tracheobronchial tree was generated to allow the creation of distinct navigation pathways to the patient's parenchymal abnormalities. After being taken to the operating room general anesthesia was initiated and the patient  was orally intubated. The video fiberoptic bronchoscope was introduced via the endotracheal tube and a general inspection was performed which showed normal right and left lung anatomy, aspiration of the bilateral mainstems was completed to remove any remaining  secretions. Robotic catheter inserted into patient's endotracheal tube.   Target #1 LUL nodule: The distinct navigation pathways prepared prior to this procedure were then utilized to navigate to patient's lesion identified on CT scan. The robotic catheter was secured into place and the vision probe was withdrawn.  Lesion location was approximated using fluoroscopy and radial endobronchial ultrasound for peripheral targeting. We used the CIOS spin c-arm cone beam CT system to confirm location and adjust for CT to body divergence. Under fluoroscopic guidance transbronchial needle biopsies were performed to be sent for cytology and pathology.  At the end of the procedure a general airway inspection was performed and there was no evidence of active bleeding. The bronchoscope was removed.  The patient tolerated the procedure well. There was no significant blood loss and there were no obvious complications. A post-procedural chest x-ray is pending.  Samples Target #1: LUL nodule 1. Transbronchial needle biopsies (21G needle) from LUL nodule  Plans:  The patient will be discharged from the PACU to home when recovered from anesthesia and after chest x-ray is reviewed. We will review the cytology results with the patient when they become available. Outpatient followup will be with me.  Raechel Chute, MD [facility] 01/27/2023 9:21 AM

## 2023-01-31 LAB — CYTOLOGY - NON PAP

## 2023-02-02 ENCOUNTER — Inpatient Hospital Stay: Payer: Medicaid Other | Attending: Oncology | Admitting: Hospice and Palliative Medicine

## 2023-02-02 DIAGNOSIS — C20 Malignant neoplasm of rectum: Secondary | ICD-10-CM | POA: Insufficient documentation

## 2023-02-02 DIAGNOSIS — F17210 Nicotine dependence, cigarettes, uncomplicated: Secondary | ICD-10-CM | POA: Insufficient documentation

## 2023-02-02 DIAGNOSIS — J441 Chronic obstructive pulmonary disease with (acute) exacerbation: Secondary | ICD-10-CM | POA: Insufficient documentation

## 2023-02-02 DIAGNOSIS — R911 Solitary pulmonary nodule: Secondary | ICD-10-CM

## 2023-02-02 DIAGNOSIS — C7800 Secondary malignant neoplasm of unspecified lung: Secondary | ICD-10-CM | POA: Insufficient documentation

## 2023-02-02 NOTE — Progress Notes (Signed)
Multidisciplinary Oncology Council Documentation  Erica [name] was presented by our [facility] on 02/02/2023, which included representatives from:  Palliative Care Dietitian  Physical/Occupational Therapist Nurse Navigator Genetics Social work Survivorship RN Financial Navigator Research RN   Erica [name] currently presents with history of colon cancer  We reviewed previous medical and familial history, history of present illness, and recent lab results along with all available histopathologic and imaging studies. The MOC considered available treatment options and made the following recommendations/referrals:  Nutrition, SW  The MOC is a meeting of clinicians from various specialty areas who evaluate and discuss patients for whom a multidisciplinary approach is being considered. Final determinations in the plan of care are those of the provider(s).   Today's extended care, comprehensive team conference, Erica [name] was not present for the discussion and was not examined.

## 2023-02-04 ENCOUNTER — Encounter: Payer: Self-pay | Admitting: Oncology

## 2023-02-04 ENCOUNTER — Inpatient Hospital Stay (HOSPITAL_BASED_OUTPATIENT_CLINIC_OR_DEPARTMENT_OTHER): Payer: Medicaid Other | Admitting: Oncology

## 2023-02-04 ENCOUNTER — Inpatient Hospital Stay: Payer: Medicaid Other | Admitting: Oncology

## 2023-02-04 VITALS — BP 158/82 | HR 94 | Temp 98.0°F | Resp 18 | Wt 172.5 lb

## 2023-02-04 DIAGNOSIS — C7800 Secondary malignant neoplasm of unspecified lung: Secondary | ICD-10-CM | POA: Diagnosis not present

## 2023-02-04 DIAGNOSIS — F17200 Nicotine dependence, unspecified, uncomplicated: Secondary | ICD-10-CM

## 2023-02-04 DIAGNOSIS — J441 Chronic obstructive pulmonary disease with (acute) exacerbation: Secondary | ICD-10-CM | POA: Diagnosis not present

## 2023-02-04 DIAGNOSIS — C20 Malignant neoplasm of rectum: Secondary | ICD-10-CM | POA: Diagnosis present

## 2023-02-04 DIAGNOSIS — Z7189 Other specified counseling: Secondary | ICD-10-CM

## 2023-02-04 DIAGNOSIS — R911 Solitary pulmonary nodule: Secondary | ICD-10-CM

## 2023-02-04 DIAGNOSIS — F17210 Nicotine dependence, cigarettes, uncomplicated: Secondary | ICD-10-CM | POA: Diagnosis not present

## 2023-02-04 DIAGNOSIS — J440 Chronic obstructive pulmonary disease with acute lower respiratory infection: Secondary | ICD-10-CM

## 2023-02-04 DIAGNOSIS — J209 Acute bronchitis, unspecified: Secondary | ICD-10-CM

## 2023-02-04 NOTE — Assessment & Plan Note (Signed)
Discussed with patient and daughter. 

## 2023-02-04 NOTE — Assessment & Plan Note (Signed)
COPD exacerbation, treated with prednisone tapering course and Augmentin twice daily for 5 days.   Symptoms improved.

## 2023-02-04 NOTE — Assessment & Plan Note (Signed)
Smoke cessation was discussed with patient.

## 2023-02-04 NOTE — Progress Notes (Signed)
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  Check MMR- pending.  Diagnosis was discussed with patient and daughter. Stage IVA disease, I recommend systemic chemotherapy with FOLFOX, restage, followed by radiation +/- chemotherapy, Followed by possible synchronous lung metastasis resection. Recommend patient to see surgery for evaluation of need of diabetic colostomy due to the bulky size of rectal cancer.  Also recommend patient to see thoracic surgeon and establish care.  She has COPD/emphysema. Patient's daughter lives closer to [facility] and prefers patient to receive oncology care closer to home.  I Discussed the case with Dr. Mosetta Putt.  She has an appointment next week. Discussed with patient about Mediport placement.  Will consult IR for port placement to expedite process.   COPD (chronic obstructive pulmonary disease) with acute bronchitis (HCC) COPD exacerbation, treated with prednisone tapering course and Augmentin twice daily for 5 days.   Symptoms improved.  Tobacco use disorder Smoke cessation was discussed with patient.  Goals of care, counseling/discussion Discussed with patient and daughter   Orders Placed This Encounter  Procedures   Ambulatory referral to Hematology / Oncology    Referral Priority:   Routine    Referral Type:   Consultation    Referral Reason:   Specialty Services Required    Requested Specialty:   Oncology    Number of Visits Requested:   1  She will follow-up with Dr. Mosetta Putt All questions were answered. The patient knows to call the clinic with any problems, questions or concerns.  Rickard Patience, MD, PhD [facility] Hematology Oncology 02/04/2023    HISTORY OF PRESENTING ILLNESS:  Erica [name] 57 y.o. female presents to establish care for rectal cancer I have reviewed her chart and materials related to her cancer extensively and collaborated history with the patient. Summary of oncologic history is as follows: Oncology History  Rectal cancer (HCC)  12/23/2022 Initial Diagnosis   Rectal cancer [facility]) Patient was seen by gastroenterology due to hematochezia as well as unintentional weight loss.  12/15/2022, colonoscopy showed fungating partially obstructing large mass in the rectum, circumferential, mass extended from about 15 cm from anus to about 8 cm from anal verge.  Biopsy was taken. Poor preparation of colon.  Colonoscopy was repeated on 12/16/2022, preparations inadequate.  Pathology showed invasive adenocarcinoma.   12/23/2022 Cancer Staging   Staging form: Colon and Rectum, AJCC 8th Edition - Clinical stage from 12/23/2022: Stage Unknown (cTX, cNX) - Signed by Rickard Patience, MD on 12/23/2022 Stage prefix: Initial diagnosis  Staging form: Colon and Rectum, AJCC 8th Edition - Clinical stage from 12/23/2022: Stage IVA (cT3, cN2, pM1a) - Signed by Rickard Patience, MD on 02/04/2023 Stage prefix: Initial diagnosis    Patient reports a cough for about a week.  She smokes daily, has COPD.  Denies any fever, shortness of breath, hemoptysis. She has multiple cough spells during today's encounter She sold her house in [place] few years ago and currently lives with her daughter at [facility].  She plans to buy house and move back.  Family history positive for colon cancer, breast cancer lung cancer.   MEDICAL HISTORY:  Past Medical History:  Diagnosis Date   (HFpEF) heart failure with preserved ejection fraction (HCC)    a. 05/2015 Echo: EF 60-65%, no rwma,  PASP .   Acute pancreatitis 08/13/2018   Anxiety    Arthritis    Asthma    Bell's palsy    no deficit   Bronchitis    Cancer (HCC) 12/2022   rectal cancer   CHF (congestive heart failure) (HCC)    COPD  (chronic obstructive pulmonary disease) (HCC)    Depression    Diabetes mellitus without complication (HCC)    type II 03/2017   Dyspnea    Fatty liver    per patient   Gall stones    per patient   Gastric ulcer    Hyperlipidemia    Hypertension    Lung nodule 12/2022   upper left   OSA (obstructive sleep apnea)    a. did not tolerate CPAP. On oxygen 2L via [place]   Pancreatitis    07/2018   Shoulder injury    6/19   Smoker     SURGICAL HISTORY: Past Surgical History:  Procedure Laterality Date   BRONCHIAL NEEDLE ASPIRATION BIOPSY  01/27/2023   Procedure: BRONCHIAL NEEDLE ASPIRATION BIOPSIES;  Surgeon: Raechel Chute, MD;  Location: [facility] ENDOSCOPY;  Service: Pulmonary;;   COLONOSCOPY WITH PROPOFOL N/A 12/15/2022   Procedure: COLONOSCOPY WITH PROPOFOL;  Surgeon: Wyline Mood, MD;  Location: [facility] ENDOSCOPY;  Service: Gastroenterology;  Laterality: N/A;   COLONOSCOPY WITH PROPOFOL N/A 12/16/2022   Procedure: COLONOSCOPY WITH PROPOFOL;  Surgeon: Wyline Mood, MD;  Location: [facility] ENDOSCOPY;  Service: Gastroenterology;  Laterality: N/A;   FLEXIBLE BRONCHOSCOPY N/A 06/20/2015   Procedure: FLEXIBLE BRONCHOSCOPY;  Surgeon: Shane Crutch, MD;  Location: [facility] ORS;  Service: Pulmonary;  Laterality: N/A;   KNEE SURGERY Left    8 knee surgeries    SOCIAL HISTORY: Social History   Socioeconomic History   Marital status: Widowed    Spouse name: Not on file   Number of children: Not on file   Years of education: Not on file   Highest education level: Not on file  Occupational History   Occupation: unemployed  Tobacco Use   Smoking status: Every Day    Current packs/day: 1.00    Average packs/day: 1 pack/day for 46.0 years (46.0 ttl pk-yrs)    Types: Cigarettes   Smokeless tobacco: Never   Tobacco comments:    0.5PPD 01/13/2023  Vaping Use   Vaping status: Never Used  Substance and Sexual Activity   Alcohol use: Yes    Comment: occasionally mixed drinks   Drug use: No   Sexual  activity: Yes    Birth control/protection: Post-menopausal  Other Topics Concern   Not on file  Social History Narrative   Not on file   Social Determinants of Health   Financial Resource Strain: High Risk (07/18/2017)   Overall Financial Resource Strain (CARDIA)    Difficulty of Paying Living Expenses: Very hard  Food Insecurity: Food Insecurity Present (12/23/2022)   Hunger Vital Sign    Worried About Running Out of Food in the Last Year: Sometimes true    Ran Out of Food in the Last Year: Sometimes true  Transportation Needs: No Transportation Needs (12/23/2022)   PRAPARE - Administrator, Civil Service (Medical): No    Lack of Transportation (Non-Medical): No  Physical Activity: Insufficiently Active (07/18/2017)   Exercise Vital Sign    Days of Exercise per Week: 5 days    Minutes of Exercise per Session: 20 min  Stress: Stress Concern Present (07/18/2017)   [employer] of Occupational Health - Occupational Stress Questionnaire  Feeling of Stress : Very much  Social Connections: Socially Isolated (07/18/2017)   Social Connection and Isolation Panel [NHANES]    Frequency of Communication with Friends and Family: Twice a week    Frequency of Social Gatherings with Friends and Family: Never    Attends Religious Services: Never    Database administrator or Organizations: No    Attends Banker Meetings: Never    Marital Status: Divorced  Catering manager Violence: Not At Risk (12/23/2022)   Humiliation, Afraid, Rape, and Kick questionnaire    Fear of Current or Ex-Partner: No    Emotionally Abused: No    Physically Abused: No    Sexually Abused: No    FAMILY HISTORY: Family History  Problem Relation Age of Onset   Breast cancer Mother        early 66's   Lung cancer Father    Brain cancer Father    Bone cancer Father    Diabetes Maternal Grandmother    Asthma Other    Colon cancer Maternal Aunt     ALLERGIES:  is allergic to tramadol  and penicillins.  MEDICATIONS:  Current Outpatient Medications  Medication Sig Dispense Refill   albuterol (VENTOLIN HFA) 108 (90 Base) MCG/ACT inhaler Inhale 2 puffs into the lungs every 6 (six) hours as needed for wheezing or shortness of breath.      Blood Glucose Monitoring Suppl (ACCU-CHEK AVIVA PLUS) w/Device KIT See admin instructions.  0   busPIRone (BUSPAR) 30 MG tablet Take 30 mg by mouth 2 (two) times daily. PER PT STATES HER DOSE WAS INCREASED     clonazePAM (KLONOPIN) 0.5 MG tablet Take 0.5 mg by mouth at bedtime as needed for anxiety.     clotrimazole (MYCELEX) 10 MG troche Take 1 tablet (10 mg total) by mouth 5 (five) times daily for 10 days. 50 tablet 0   diltiazem (CARDIZEM CD) 120 MG 24 hr capsule TAKE 1 CAPSULE BY MOUTH EVERY DAY 60 capsule 1   diltiazem (CARDIZEM) 30 MG tablet TAKE 1 TABLET(30 MG) BY MOUTH THREE TIMES DAILY AS NEEDED 270 tablet 0   DULoxetine (CYMBALTA) 60 MG capsule Take 60 mg by mouth 2 (two) times daily.      Fluticasone-Salmeterol (ADVAIR) 250-50 MCG/DOSE AEPB Inhale 1 puff into the lungs 2 (two) times daily.     furosemide (LASIX) 40 MG tablet TAKE 1 TABLET BY MOUTH EVERY DAY. MAY TAKE AN ADDITIONAL TABLET IN THE AM FOR WEIGHT GAIN AS NEEDED 110 tablet 0   JARDIANCE 25 MG TABS tablet Take 25 mg by mouth daily.     metFORMIN (GLUCOPHAGE-XR) 500 MG 24 hr tablet Take 1,000 mg by mouth 2 (two) times daily.     OXYGEN Inhale 2 L into the lungs at bedtime. Or as needed throughout the day if short of breath     pantoprazole (PROTONIX) 40 MG tablet Take 1 tablet (40 mg total) by mouth daily. 30 tablet 0   potassium chloride SA (KLOR-CON) 20 MEQ tablet Take 20 mEq by mouth 2 (two) times daily.     pregabalin (LYRICA) 150 MG capsule Take 150 mg by mouth 3 (three) times daily.     rosuvastatin (CRESTOR) 20 MG tablet Take 20 mg by mouth at bedtime.     spironolactone (ALDACTONE) 25 MG tablet TAKE 1 TABLET(25 MG) BY MOUTH EVERY MORNING 90 tablet 0   sucralfate  (CARAFATE) 1 G tablet Take 1 tablet (1 g total) by mouth 4 (four) times  daily -  with meals and at bedtime. (Patient taking differently: Take 1 g by mouth 2 (two) times daily.) 120 tablet 0   tiotropium (SPIRIVA) 18 MCG inhalation capsule Place 18 mcg into inhaler and inhale daily.     tiZANidine (ZANAFLEX) 4 MG tablet Take 8 mg by mouth 3 (three) times daily.     zolpidem (AMBIEN) 10 MG tablet Take 10 mg by mouth at bedtime as needed for sleep.     No current facility-administered medications for this visit.    Review of Systems  Constitutional:  Positive for unexpected weight change. Negative for appetite change, chills, fatigue and fever.  HENT:   Negative for hearing loss and voice change.   Eyes:  Negative for eye problems.  Respiratory:  Positive for cough. Negative for chest tightness and hemoptysis.   Cardiovascular:  Negative for chest pain.  Gastrointestinal:  Positive for blood in stool. Negative for abdominal distention and abdominal pain.  Endocrine: Negative for hot flashes.  Genitourinary:  Negative for difficulty urinating and frequency.   Musculoskeletal:  Negative for arthralgias.  Skin:  Negative for itching and rash.  Neurological:  Negative for extremity weakness.  Hematological:  Negative for adenopathy.  Psychiatric/Behavioral:  Negative for confusion.      PHYSICAL EXAMINATION: ECOG PERFORMANCE STATUS: 1 - Symptomatic but completely ambulatory  Vitals:   02/04/23 1146  BP: (!) 158/82  Pulse: 94  Resp: 18  Temp: 98 F (36.7 C)  SpO2: 98%   Filed Weights   02/04/23 1146  Weight: 172 lb 8 oz (78.2 kg)    Physical Exam Constitutional:      General: She is not in acute distress.    Appearance: She is not diaphoretic.  HENT:     Head: Normocephalic and atraumatic.     Nose: Nose normal.     Mouth/Throat:     Pharynx: No oropharyngeal exudate.  Eyes:     General: No scleral icterus.    Pupils: Pupils are equal, round, and reactive to light.   Cardiovascular:     Rate and Rhythm: Normal rate and regular rhythm.     Heart sounds: No murmur heard. Pulmonary:     Effort: Pulmonary effort is normal. No respiratory distress.     Comments: Decreased breath sound bilaterally. Abdominal:     General: There is no distension.     Palpations: Abdomen is soft.     Tenderness: There is no abdominal tenderness.  Musculoskeletal:        General: Normal range of motion.     Cervical back: Normal range of motion and neck supple.  Skin:    General: Skin is warm and dry.     Findings: No erythema.  Neurological:     Mental Status: She is alert and oriented to person, place, and time.     Cranial Nerves: No cranial nerve deficit.     Motor: No abnormal muscle tone.     Coordination: Coordination normal.  Psychiatric:        Mood and Affect: Affect normal.      LABORATORY DATA:  I have reviewed the data as listed    Latest Ref Rng & Units 01/27/2023    6:57 AM 12/23/2022    4:07 PM 12/09/2022    3:15 PM  CBC  WBC 4.0 - 10.5 K/uL 8.4  12.6  9.4   Hemoglobin 12.0 - 15.0 g/dL 16.1  09.6  04.5   Hematocrit 36.0 - 46.0 % 45.9  42.1  41.4   Platelets 150 - 400 K/uL 236  284  288       Latest Ref Rng & Units 01/27/2023    6:57 AM 12/23/2022    4:07 PM 04/22/2022    5:52 PM  CMP  Glucose 70 - 99 mg/dL 284  83  132   BUN 6 - 20 mg/dL 7  9  10    Creatinine 0.44 - 1.00 mg/dL 4.40  1.02  7.25   Sodium 135 - 145 mmol/L 139  139  138   Potassium 3.5 - 5.1 mmol/L 3.4  3.8  3.9   Chloride 98 - 111 mmol/L 104  107  101   CO2 22 - 32 mmol/L 21  23  26    Calcium 8.9 - 10.3 mg/dL 9.2  8.5  9.2   Total Protein 6.5 - 8.1 g/dL  6.7  6.8   Total Bilirubin 0.3 - 1.2 mg/dL  0.4  0.8   Alkaline Phos 38 - 126 U/L  73  87   AST 15 - 41 U/L  17  31   ALT 0 - 44 U/L  11  16      RADIOGRAPHIC STUDIES: I have personally reviewed the radiological images as listed and agreed with the findings in the report. CT Super D Chest Wo Contrast  Result Date:  01/31/2023 CLINICAL DATA:  Lung nodule suspicious for metastasis, rectal cancer * Tracking Code: BO * EXAM: CT CHEST WITHOUT CONTRAST TECHNIQUE: Multidetector CT imaging of the chest was performed using thin slice collimation for electromagnetic bronchoscopy planning purposes, without intravenous contrast. RADIATION DOSE REDUCTION: This exam was performed according to the departmental dose-optimization program which includes automated exposure control, adjustment of the mA and/or kV according to patient size and/or use of iterative reconstruction technique. COMPARISON:  01/03/2023 FINDINGS: Cardiovascular: Aortic atherosclerosis. Normal heart size. Left coronary artery calcifications no pericardial effusion. Mediastinum/Nodes: No enlarged mediastinal, hilar, or axillary lymph nodes. Thyroid gland, trachea, and esophagus demonstrate no significant findings. Lungs/Pleura: Unchanged perihilar nodule of the left upper lobe measuring 2.0 x 2.0 cm (series 4, image 71). Scattered adjacent ground-glass. Background of very fine centrilobular pulmonary nodularity, most concentrated in the lung apices. Minimal paraseptal emphysema and diffuse bilateral bronchial wall thickening. No pleural effusion or pneumothorax. Upper Abdomen: No acute abnormality. Macroscopic fat containing bilateral adrenal adenomata, benign, requiring no specific further follow-up or characterization. Musculoskeletal: No chest wall abnormality. No acute osseous findings. IMPRESSION: 1. Unchanged perihilar nodule of the left upper lobe measuring 2.0 x 2.0 cm. This is primarily suspicious for an incidental, synchronous pulmonary adenocarcinoma, however a solitary pulmonary metastasis is certainly a differential consideration. 2. Minimal emphysema with background of very fine centrilobular pulmonary nodularity, most concentrated in the lung apices, consistent with smoking-related respiratory bronchiolitis. 3. Coronary artery disease. Aortic Atherosclerosis  (ICD10-I70.0) and Emphysema (ICD10-J43.9). Electronically Signed   By: Jearld Lesch M.D.   On: 01/31/2023 16:30   DG Chest Port 1 View  Result Date: 01/27/2023 CLINICAL DATA:  Status post bronchoscopy. EXAM: PORTABLE CHEST 1 VIEW COMPARISON:  CT chest 01/26/2023 and 01/03/2023 FINDINGS: Heart size normal. Atherosclerotic calcifications are present at the aortic arch. Left hilar fullness is consistent with the known nodule. No other focal opacity or airspace disease present. No edema or effusion is present. The visualized soft tissues and bony thorax are unremarkable. IMPRESSION: 1. Left hilar fullness consistent with the known nodule. 2. No acute cardiopulmonary disease. Electronically Signed   By: Virl Son.D.  On: 01/27/2023 09:55   DG C-ARM BRONCHOSCOPY  Result Date: 01/27/2023 C-ARM BRONCHOSCOPY: Fluoroscopy was utilized by the requesting physician.  No radiographic interpretation.

## 2023-02-04 NOTE — Assessment & Plan Note (Addendum)
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  Check MMR- pending.  Diagnosis was discussed with patient and daughter. Stage IVA disease, I recommend systemic chemotherapy with FOLFOX, restage, followed by radiation +/- chemotherapy, Followed by possible synchronous lung metastasis resection. Recommend patient to see surgery for evaluation of need of diabetic colostomy due to the bulky size of rectal cancer.  Also recommend patient to see thoracic surgeon and establish care.  She has COPD/emphysema. Patient's daughter lives closer to [facility] and prefers patient to receive oncology care closer to home.  I Discussed the case with Dr. Mosetta Putt.  She has an appointment next week. Discussed with patient about Mediport placement.  Will consult IR for port placement to expedite process.

## 2023-02-07 ENCOUNTER — Telehealth: Payer: Self-pay

## 2023-02-07 ENCOUNTER — Telehealth: Payer: Self-pay | Admitting: Hospice and Palliative Medicine

## 2023-02-07 ENCOUNTER — Other Ambulatory Visit: Payer: Self-pay

## 2023-02-07 ENCOUNTER — Encounter: Payer: Self-pay | Admitting: Gastroenterology

## 2023-02-07 DIAGNOSIS — C20 Malignant neoplasm of rectum: Secondary | ICD-10-CM

## 2023-02-07 NOTE — Telephone Encounter (Signed)
Called patient twice; unable to leave a message to schedule appointment with Nutrionist

## 2023-02-07 NOTE — Telephone Encounter (Signed)
Referral placed for [facility] IR.

## 2023-02-07 NOTE — Telephone Encounter (Signed)
-----   Message from Rickard Patience sent at 02/04/2023  6:13 PM EDT ----- Wynelle Cleveland, Patient will establish with Dr. Mosetta Putt next week.  Please consult IR for Mediport placement ASAP [if this can be done by IR at [facility] that will be great.]  Cc Dr. Chrystine Oiler

## 2023-02-08 NOTE — Progress Notes (Signed)
I spoke with Erica [name].  I explained my role as a nurse navigator and provided my contact information.  I reviewed the new appt for port a cath placement on 7/26 at [facility] radiology department arrive by 1200, npo after 0800, the need for a driver.  I moved her appts with [place] on 7/26: Lab at 1000 arrive by 0945 and appt with Cassie and Dr Mosetta Putt at 1030.  I provided our address.  She is agreeable to these appts.  All questions were answered.  She verbalized understanding.

## 2023-02-09 NOTE — Progress Notes (Deleted)
[facility] [phone]   [phone]  CONSULT NOTE  REFERRING PHYSICIAN: Dr. Cathie Hoops  REASON FOR CONSULTATION:  Rectal cancer   HPI Erica [name] is a 57 y.o. female with a past medical history significant for CHF, HTN, pulmonary HTN, COPD, sleep apnea, pancreatitis, diabetes, sciatica, DDD, PTSD, Rosacea, and *** is referred to the clinic for rectal cancer.   The patient began having rectal bleeding in the toilet bowl and on the toilet paper in December 2023.  She also had associated changes in her bowel habits with more watery consistency and shape of the stool.  She also was having regular bowel movements but decreased volume.  The patient had never had a colonoscopy before.  She was referred to GI and establish care on 12/09/2022.  She underwent a colonoscopy on 12/16/22.  The bowel prep was poor and that showed a partially obstructing large mass in the rectum.   ***See surgery for consideration of colostomy?  The final pathology showed invasive adenocarcinoma.  MLH1 was negative  The patient establish care with Dr. Cathie Hoops in [facility] medical oncology who facilitated the staging workup.  She had an MRI of the pelvic on 12/24/2022 showing 9.2 cm concentric mid/lower rectal mass corresponding with her known rectal cancer T3c.  There is noted to be approximately 10 perirectal nodes measuring up to 10 mm (N2) was noted to be 3.4 cm from the anal sphincter.   CT of the chest on 01/03/2023 showed 2.2 cm left upper lobe nodule suspicious for metastasis.  There were no other findings for metastatic disease in the abdomen.   Her baseline CEA was elevated at 18.1  Her pulmonologist, Dr. Aundria Rud, arranged for bronchoscopy and biopsy of the lung lesion on 01/27/2023.  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   She saw Dr. Cathie Hoops on 02/04/2023.  Dr. Cathie Hoops discussed systemic  chemotherapy with FOLFOX.  He also recommended that she see a thoracic surgeon to establish care.   She is scheduled for Port-A-Cath later today.  I will send her prescription for Emla cream.  Overall, the patient is feeling ***today.  She denies any fever, chills, or night sweats.  She lost approximately ***6 pounds since ***.  Interested in seeing nutrition?  Drinking protein supplemental drinks?  Has diabetes and needs to drink Glucerna?  She denies any odynophagia or dysphagia.  She has some baseline dyspnea on exertion secondary to her COPD.  She is a current smoker.  She is ***in quitting at this time and would or would not be interested in nicotine replacement?***Cough?  Denies any chest pain or hemoptysis.  Denies any nausea, vomiting, diarrhea, or constipation. , Abdominal pain?  Denies any rectal bleeding?  Blood in the stool?  Back pain?   She declined genetics because no bioloical children.  Lives with daughter in [place].     HPI  Past Medical History:  Diagnosis Date   (HFpEF) heart failure with preserved ejection fraction (HCC)    a. 05/2015 Echo: EF 60-65%, no rwma, PASP .   Acute pancreatitis 08/13/2018   Anxiety    Arthritis    Asthma    Bell's palsy    no deficit   Bronchitis    Cancer (HCC) 12/2022   rectal cancer   CHF (congestive heart failure) (HCC)    COPD (chronic obstructive pulmonary disease) (HCC)    Depression  Diabetes mellitus without complication (HCC)    type II 03/2017   Dyspnea    Fatty liver    per patient   Gall stones    per patient   Gastric ulcer    Hyperlipidemia    Hypertension    Lung nodule 12/2022   upper left   OSA (obstructive sleep apnea)    a. did not tolerate CPAP. On oxygen 2L via [place]   Pancreatitis    07/2018   Shoulder injury    6/19   Smoker     Past Surgical History:  Procedure Laterality Date   BRONCHIAL NEEDLE ASPIRATION BIOPSY  01/27/2023   Procedure: BRONCHIAL NEEDLE ASPIRATION BIOPSIES;   Surgeon: Raechel Chute, MD;  Location: [facility] ENDOSCOPY;  Service: Pulmonary;;   COLONOSCOPY WITH PROPOFOL N/A 12/15/2022   Procedure: COLONOSCOPY WITH PROPOFOL;  Surgeon: Wyline Mood, MD;  Location: [facility] ENDOSCOPY;  Service: Gastroenterology;  Laterality: N/A;   COLONOSCOPY WITH PROPOFOL N/A 12/16/2022   Procedure: COLONOSCOPY WITH PROPOFOL;  Surgeon: Wyline Mood, MD;  Location: [facility] ENDOSCOPY;  Service: Gastroenterology;  Laterality: N/A;   FLEXIBLE BRONCHOSCOPY N/A 06/20/2015   Procedure: FLEXIBLE BRONCHOSCOPY;  Surgeon: Shane Crutch, MD;  Location: [facility] ORS;  Service: Pulmonary;  Laterality: N/A;   KNEE SURGERY Left    8 knee surgeries    Family History  Problem Relation Age of Onset   Breast cancer Mother        early 41's   Lung cancer Father    Brain cancer Father    Bone cancer Father    Diabetes Maternal Grandmother    Asthma Other    Colon cancer Maternal Aunt     Social History Social History   Tobacco Use   Smoking status: Every Day    Current packs/day: 1.00    Average packs/day: 1 pack/day for 46.0 years (46.0 ttl pk-yrs)    Types: Cigarettes   Smokeless tobacco: Never   Tobacco comments:    0.5PPD 01/13/2023  Vaping Use   Vaping status: Never Used  Substance Use Topics   Alcohol use: Yes    Comment: occasionally mixed drinks   Drug use: No    Allergies  Allergen Reactions   Tramadol Other (See Comments) and Palpitations    Heart Skipping Beats Heart palpatations   Penicillins Rash    Has patient had a PCN reaction causing immediate rash, facial/tongue/throat swelling, SOB or lightheadedness with hypotension: Yes Has patient had a PCN reaction causing severe rash involving mucus membranes or skin necrosis: No Has patient had a PCN reaction that required hospitalization: No Has patient had a PCN reaction occurring within the last 10 years: No If all of the above answers are "NO", then may proceed with Cephalosporin use.    Current Outpatient  Medications  Medication Sig Dispense Refill   albuterol (VENTOLIN HFA) 108 (90 Base) MCG/ACT inhaler Inhale 2 puffs into the lungs every 6 (six) hours as needed for wheezing or shortness of breath.      Blood Glucose Monitoring Suppl (ACCU-CHEK AVIVA PLUS) w/Device KIT See admin instructions.  0   busPIRone (BUSPAR) 30 MG tablet Take 30 mg by mouth 2 (two) times daily. PER PT STATES HER DOSE WAS INCREASED     clonazePAM (KLONOPIN) 0.5 MG tablet Take 0.5 mg by mouth at bedtime as needed for anxiety.     diltiazem (CARDIZEM CD) 120 MG 24 hr capsule TAKE 1 CAPSULE BY MOUTH EVERY DAY 60 capsule 1   diltiazem (CARDIZEM) 30 MG  tablet TAKE 1 TABLET(30 MG) BY MOUTH THREE TIMES DAILY AS NEEDED 270 tablet 0   DULoxetine (CYMBALTA) 60 MG capsule Take 60 mg by mouth 2 (two) times daily.      Fluticasone-Salmeterol (ADVAIR) 250-50 MCG/DOSE AEPB Inhale 1 puff into the lungs 2 (two) times daily.     furosemide (LASIX) 40 MG tablet TAKE 1 TABLET BY MOUTH EVERY DAY. MAY TAKE AN ADDITIONAL TABLET IN THE AM FOR WEIGHT GAIN AS NEEDED 110 tablet 0   JARDIANCE 25 MG TABS tablet Take 25 mg by mouth daily.     metFORMIN (GLUCOPHAGE-XR) 500 MG 24 hr tablet Take 1,000 mg by mouth 2 (two) times daily.     OXYGEN Inhale 2 L into the lungs at bedtime. Or as needed throughout the day if short of breath     pantoprazole (PROTONIX) 40 MG tablet Take 1 tablet (40 mg total) by mouth daily. 30 tablet 0   potassium chloride SA (KLOR-CON) 20 MEQ tablet Take 20 mEq by mouth 2 (two) times daily.     pregabalin (LYRICA) 150 MG capsule Take 150 mg by mouth 3 (three) times daily.     rosuvastatin (CRESTOR) 20 MG tablet Take 20 mg by mouth at bedtime.     spironolactone (ALDACTONE) 25 MG tablet TAKE 1 TABLET(25 MG) BY MOUTH EVERY MORNING 90 tablet 0   sucralfate (CARAFATE) 1 G tablet Take 1 tablet (1 g total) by mouth 4 (four) times daily -  with meals and at bedtime. (Patient taking differently: Take 1 g by mouth 2 (two) times daily.) 120  tablet 0   tiotropium (SPIRIVA) 18 MCG inhalation capsule Place 18 mcg into inhaler and inhale daily.     tiZANidine (ZANAFLEX) 4 MG tablet Take 8 mg by mouth 3 (three) times daily.     zolpidem (AMBIEN) 10 MG tablet Take 10 mg by mouth at bedtime as needed for sleep.     No current facility-administered medications for this visit.    REVIEW OF SYSTEMS:   Review of Systems  Constitutional: Negative for appetite change, chills, fatigue, fever and unexpected weight change.  HENT:   Negative for mouth sores, nosebleeds, sore throat and trouble swallowing.   Eyes: Negative for eye problems and icterus.  Respiratory: Negative for cough, hemoptysis, shortness of breath and wheezing.   Cardiovascular: Negative for chest pain and leg swelling.  Gastrointestinal: Negative for abdominal pain, constipation, diarrhea, nausea and vomiting.  Genitourinary: Negative for bladder incontinence, difficulty urinating, dysuria, frequency and hematuria.   Musculoskeletal: Negative for back pain, gait problem, neck pain and neck stiffness.  Skin: Negative for itching and rash.  Neurological: Negative for dizziness, extremity weakness, gait problem, headaches, light-headedness and seizures.  Hematological: Negative for adenopathy. Does not bruise/bleed easily.  Psychiatric/Behavioral: Negative for confusion, depression and sleep disturbance. The patient is not nervous/anxious.     PHYSICAL EXAMINATION:  Last menstrual period 03/13/2015.  ECOG PERFORMANCE STATUS: {CHL ONC ECOG [id number]  Physical Exam  Constitutional: Oriented to person, place, and time and well-developed, well-nourished, and in no distress. No distress.  HENT:  Head: Normocephalic and atraumatic.  Mouth/Throat: Oropharynx is clear and moist. No oropharyngeal exudate.  Eyes: Conjunctivae are normal. Right eye exhibits no discharge. Left eye exhibits no discharge. No scleral icterus.  Neck: Normal range of motion. Neck supple.   Cardiovascular: Normal rate, regular rhythm, normal heart sounds and intact distal pulses.   Pulmonary/Chest: Effort normal and breath sounds normal. No respiratory distress. No wheezes. No rales.  Abdominal: Soft. Bowel sounds are normal. Exhibits no distension and no mass. There is no tenderness.  Musculoskeletal: Normal range of motion. Exhibits no edema.  Lymphadenopathy:    No cervical adenopathy.  Neurological: Alert and oriented to person, place, and time. Exhibits normal muscle tone. Gait normal. Coordination normal.  Skin: Skin is warm and dry. No rash noted. Not diaphoretic. No erythema. No pallor.  Psychiatric: Mood, memory and judgment normal.  Vitals reviewed.  LABORATORY DATA: Lab Results  Component Value Date   WBC 8.4 01/27/2023   HGB 14.7 01/27/2023   HCT 45.9 01/27/2023   MCV 95.2 01/27/2023   PLT 236 01/27/2023      Chemistry      Component Value Date/Time   NA 139 01/27/2023 0657   NA 140 04/22/2016 1532   NA 139 02/26/2014 1826   K 3.4 (L) 01/27/2023 0657   K 3.7 02/26/2014 1826   CL 104 01/27/2023 0657   CL 104 02/26/2014 1826   CO2 21 (L) 01/27/2023 0657   CO2 27 02/26/2014 1826   BUN 7 01/27/2023 0657   BUN 10 04/22/2016 1532   BUN 4 (L) 02/26/2014 1826   CREATININE 0.67 01/27/2023 0657   CREATININE 0.60 12/23/2022 1607   CREATININE 0.74 02/26/2014 1826      Component Value Date/Time   CALCIUM 9.2 01/27/2023 0657   CALCIUM 8.3 (L) 02/26/2014 1826   ALKPHOS 73 12/23/2022 1607   ALKPHOS 89 03/10/2013 0100   AST 17 12/23/2022 1607   ALT 11 12/23/2022 1607   ALT 57 03/10/2013 0100   BILITOT 0.4 12/23/2022 1607       RADIOGRAPHIC STUDIES: CT Super D Chest Wo Contrast  Result Date: 01/31/2023 CLINICAL DATA:  Lung nodule suspicious for metastasis, rectal cancer * Tracking Code: BO * EXAM: CT CHEST WITHOUT CONTRAST TECHNIQUE: Multidetector CT imaging of the chest was performed using thin slice collimation for electromagnetic bronchoscopy  planning purposes, without intravenous contrast. RADIATION DOSE REDUCTION: This exam was performed according to the departmental dose-optimization program which includes automated exposure control, adjustment of the mA and/or kV according to patient size and/or use of iterative reconstruction technique. COMPARISON:  01/03/2023 FINDINGS: Cardiovascular: Aortic atherosclerosis. Normal heart size. Left coronary artery calcifications no pericardial effusion. Mediastinum/Nodes: No enlarged mediastinal, hilar, or axillary lymph nodes. Thyroid gland, trachea, and esophagus demonstrate no significant findings. Lungs/Pleura: Unchanged perihilar nodule of the left upper lobe measuring 2.0 x 2.0 cm (series 4, image 71). Scattered adjacent ground-glass. Background of very fine centrilobular pulmonary nodularity, most concentrated in the lung apices. Minimal paraseptal emphysema and diffuse bilateral bronchial wall thickening. No pleural effusion or pneumothorax. Upper Abdomen: No acute abnormality. Macroscopic fat containing bilateral adrenal adenomata, benign, requiring no specific further follow-up or characterization. Musculoskeletal: No chest wall abnormality. No acute osseous findings. IMPRESSION: 1. Unchanged perihilar nodule of the left upper lobe measuring 2.0 x 2.0 cm. This is primarily suspicious for an incidental, synchronous pulmonary adenocarcinoma, however a solitary pulmonary metastasis is certainly a differential consideration. 2. Minimal emphysema with background of very fine centrilobular pulmonary nodularity, most concentrated in the lung apices, consistent with smoking-related respiratory bronchiolitis. 3. Coronary artery disease. Aortic Atherosclerosis (ICD10-I70.0) and Emphysema (ICD10-J43.9). Electronically Signed   By: Jearld Lesch M.D.   On: 01/31/2023 16:30   DG Chest Port 1 View  Result Date: 01/27/2023 CLINICAL DATA:  Status post bronchoscopy. EXAM: PORTABLE CHEST 1 VIEW COMPARISON:  CT chest  01/26/2023 and 01/03/2023 FINDINGS: Heart size normal. Atherosclerotic calcifications are present at  the aortic arch. Left hilar fullness is consistent with the known nodule. No other focal opacity or airspace disease present. No edema or effusion is present. The visualized soft tissues and bony thorax are unremarkable. IMPRESSION: 1. Left hilar fullness consistent with the known nodule. 2. No acute cardiopulmonary disease. Electronically Signed   By: Marin Roberts M.D.   On: 01/27/2023 09:55   DG C-ARM BRONCHOSCOPY  Result Date: 01/27/2023 C-ARM BRONCHOSCOPY: Fluoroscopy was utilized by the requesting physician.  No radiographic interpretation.    ASSESSMENT:  Rectal cancer (HCC) T3c, N2, M1), Diagnosed in June 2024 -Presented with rectal bleeding in December 2023. Underwent colonoscopy in May 2024 showing partially obstructing large mass.  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  -MLH1 was negative -Baseline CEA 18.1 -The patient was seen with Dr. Mosetta Putt.  -Dr. Mosetta Putt had a discussion with patient and daughter about her current condition and recommended treatment options. -Stage IVA disease, Dr. Mosetta Putt recommend systemic chemotherapy with FOLFOX***, restage, followed by radiation +/- chemotherapy, Followed by possible synchronous lung metastasis resection***. -Recommend patient to see surgery for evaluation of need of *** -Seeing surgeon to consider colostomy due to the bulky size of rectal cancer.   -Thoracic surgeon on ***  She has COPD/emphysema. -She is scheduled  -Dr. Mosetta Putt reviewed the logistics of FOLFOX.  Dr. Mosetta Putt would recommend chemotherapy for approximately 3 to 6 months.  She will arrange for repeat CT scan after 3 months of treatment.           --Chemotherapy consent: Side effects including but does not limited to, fatigue, nausea, vomiting, diarrhea, hair loss, neuropathy, fluid retention, renal and kidney dysfunction, neutropenic  fever, needed for blood transfusion, bleeding, were discussed with patient in great detail. She agrees to proceed.  -The patient is interested in starting treatment with chemotherapy FOLFOX -Dr. Mosetta Putt may consider adding Avastin starting from cycle #2 or 3 based on the foundation 1 testing results. -I will send prescriptions for Compazine and Zofran to the patient's pharmacy.  I will send a prescription for Emla cream to the patient's pharmacy -We will arrange for chemo education class prior to receiving his first cycle of treatment. -We will see the patient back for follow-up visit before starting cycle #1 of treatment    COPD (chronic obstructive pulmonary disease)  Follows with Dr. Aundria Rud Symptoms improved.   Tobacco use disorder Smoke cessation was discussed with patient. -She is currently smoking ***.  -Discuss NRT   Goals of care, counseling/discussion -***  PLAN: -Send EMLA cream  -Chemoeducation prior to first visit -Referral to nutritionist -Anti-emetics sent to pharmacy -F/U on *** -Foundation one testing ***  The patient voices understanding of current disease status and treatment options and is in agreement with the current care plan.  All questions were answered. The patient knows to call the clinic with any problems, questions or concerns. We can certainly see the patient much sooner if necessary.  Thank you so much for allowing me to participate in the care of Erica [name]. I will continue to follow up the patient with you and assist in her care.  I spent {CHL ONC TIME VISIT - EPPIR:[id number]} counseling the patient face to face. The total time spent in the appointment was {CHL ONC TIME VISIT - TKZSW:[id number]}.  Disclaimer: This note was dictated with voice recognition software. Similar sounding words can inadvertently be transcribed and may not be corrected upon review.    L  February 09, 2023, 12:46 PM

## 2023-02-10 ENCOUNTER — Ambulatory Visit: Payer: Medicaid Other | Admitting: Gastroenterology

## 2023-02-10 ENCOUNTER — Other Ambulatory Visit: Payer: Self-pay | Admitting: Physician Assistant

## 2023-02-10 ENCOUNTER — Other Ambulatory Visit: Payer: Self-pay | Admitting: Student

## 2023-02-10 DIAGNOSIS — Z419 Encounter for procedure for purposes other than remedying health state, unspecified: Secondary | ICD-10-CM

## 2023-02-10 DIAGNOSIS — C20 Malignant neoplasm of rectum: Secondary | ICD-10-CM

## 2023-02-10 DIAGNOSIS — R911 Solitary pulmonary nodule: Secondary | ICD-10-CM

## 2023-02-10 NOTE — Progress Notes (Deleted)
Wyline Mood MD, MRCP(U.K) [address]  [address]  [place], [place] [address]  Main: [phone]  Fax: [phone]   Primary Care Physician: System, Provider Not In  Primary Gastroenterologist:  Dr. Wyline Mood   No chief complaint on file.   HPI: Erica [name] is a 57 y.o. female   Summary of history :  Initially referred and seen back in 11/2022 for rectal bleeding. Colonoscopy in 11/2022 showed fungating , partially obstructing rectal mass 8 cm from anal verge . Stage 4 rectal cancer , on chemo and radiation , plan was for her to see surgery for diverting colostomy .   Interval history   ***/***/2023-  ***/***/2023  ***   Current Outpatient Medications  Medication Sig Dispense Refill   albuterol (VENTOLIN HFA) 108 (90 Base) MCG/ACT inhaler Inhale 2 puffs into the lungs every 6 (six) hours as needed for wheezing or shortness of breath.      Blood Glucose Monitoring Suppl (ACCU-CHEK AVIVA PLUS) w/Device KIT See admin instructions.  0   busPIRone (BUSPAR) 30 MG tablet Take 30 mg by mouth 2 (two) times daily. PER PT STATES HER DOSE WAS INCREASED     clonazePAM (KLONOPIN) 0.5 MG tablet Take 0.5 mg by mouth at bedtime as needed for anxiety.     diltiazem (CARDIZEM CD) 120 MG 24 hr capsule TAKE 1 CAPSULE BY MOUTH EVERY DAY 60 capsule 1   diltiazem (CARDIZEM) 30 MG tablet TAKE 1 TABLET(30 MG) BY MOUTH THREE TIMES DAILY AS NEEDED 270 tablet 0   DULoxetine (CYMBALTA) 60 MG capsule Take 60 mg by mouth 2 (two) times daily.      Fluticasone-Salmeterol (ADVAIR) 250-50 MCG/DOSE AEPB Inhale 1 puff into the lungs 2 (two) times daily.     furosemide (LASIX) 40 MG tablet TAKE 1 TABLET BY MOUTH EVERY DAY. MAY TAKE AN ADDITIONAL TABLET IN THE AM FOR WEIGHT GAIN AS NEEDED 110 tablet 0   JARDIANCE 25 MG TABS tablet Take 25 mg by mouth daily.     metFORMIN (GLUCOPHAGE-XR) 500 MG 24 hr tablet Take 1,000 mg by mouth 2 (two) times daily.     OXYGEN Inhale 2 L into the lungs at bedtime. Or as  needed throughout the day if short of breath     pantoprazole (PROTONIX) 40 MG tablet Take 1 tablet (40 mg total) by mouth daily. 30 tablet 0   potassium chloride SA (KLOR-CON) 20 MEQ tablet Take 20 mEq by mouth 2 (two) times daily.     pregabalin (LYRICA) 150 MG capsule Take 150 mg by mouth 3 (three) times daily.     rosuvastatin (CRESTOR) 20 MG tablet Take 20 mg by mouth at bedtime.     spironolactone (ALDACTONE) 25 MG tablet TAKE 1 TABLET(25 MG) BY MOUTH EVERY MORNING 90 tablet 0   sucralfate (CARAFATE) 1 G tablet Take 1 tablet (1 g total) by mouth 4 (four) times daily -  with meals and at bedtime. (Patient taking differently: Take 1 g by mouth 2 (two) times daily.) 120 tablet 0   tiotropium (SPIRIVA) 18 MCG inhalation capsule Place 18 mcg into inhaler and inhale daily.     tiZANidine (ZANAFLEX) 4 MG tablet Take 8 mg by mouth 3 (three) times daily.     zolpidem (AMBIEN) 10 MG tablet Take 10 mg by mouth at bedtime as needed for sleep.     No current facility-administered medications for this visit.    Allergies as of 02/10/2023 - Review Complete 02/04/2023  Allergen Reaction Noted   Tramadol Other (See Comments) and Palpitations 02/09/2013   Penicillins Rash 06/13/2015       Interval history   ***/***/202*   ***/***/2024   ROS:  General: Negative for anorexia, weight loss, fever, chills, fatigue, weakness. ENT: Negative for hoarseness, difficulty swallowing , nasal congestion. CV: Negative for chest pain, angina, palpitations, dyspnea on exertion, peripheral edema.  Respiratory: Negative for dyspnea at rest, dyspnea on exertion, cough, sputum, wheezing.  GI: See history of present illness. GU:  Negative for dysuria, hematuria, urinary incontinence, urinary frequency, nocturnal urination.  Endo: Negative for unusual weight change.    Physical Examination:   LMP 03/13/2015 (Approximate) Comment: more than 2 years ago  General: Well-nourished, well-developed in no acute  distress.  Eyes: No icterus. Conjunctivae pink. Mouth: Oropharyngeal mucosa moist and pink , no lesions erythema or exudate. Lungs: Clear to auscultation bilaterally. Non-labored. Heart: Regular rate and rhythm, no murmurs rubs or gallops.  Abdomen: Bowel sounds are normal, nontender, nondistended, no hepatosplenomegaly or masses, no abdominal bruits or hernia , no rebound or guarding.   Extremities: No lower extremity edema. No clubbing or deformities. Neuro: Alert and oriented x 3.  Grossly intact. Skin: Warm and dry, no jaundice.   Psych: Alert and cooperative, normal mood and affect.   Imaging Studies: CT Super D Chest Wo Contrast  Result Date: 01/31/2023 CLINICAL DATA:  Lung nodule suspicious for metastasis, rectal cancer * Tracking Code: BO * EXAM: CT CHEST WITHOUT CONTRAST TECHNIQUE: Multidetector CT imaging of the chest was performed using thin slice collimation for electromagnetic bronchoscopy planning purposes, without intravenous contrast. RADIATION DOSE REDUCTION: This exam was performed according to the departmental dose-optimization program which includes automated exposure control, adjustment of the mA and/or kV according to patient size and/or use of iterative reconstruction technique. COMPARISON:  01/03/2023 FINDINGS: Cardiovascular: Aortic atherosclerosis. Normal heart size. Left coronary artery calcifications no pericardial effusion. Mediastinum/Nodes: No enlarged mediastinal, hilar, or axillary lymph nodes. Thyroid gland, trachea, and esophagus demonstrate no significant findings. Lungs/Pleura: Unchanged perihilar nodule of the left upper lobe measuring 2.0 x 2.0 cm (series 4, image 71). Scattered adjacent ground-glass. Background of very fine centrilobular pulmonary nodularity, most concentrated in the lung apices. Minimal paraseptal emphysema and diffuse bilateral bronchial wall thickening. No pleural effusion or pneumothorax. Upper Abdomen: No acute abnormality. Macroscopic fat  containing bilateral adrenal adenomata, benign, requiring no specific further follow-up or characterization. Musculoskeletal: No chest wall abnormality. No acute osseous findings. IMPRESSION: 1. Unchanged perihilar nodule of the left upper lobe measuring 2.0 x 2.0 cm. This is primarily suspicious for an incidental, synchronous pulmonary adenocarcinoma, however a solitary pulmonary metastasis is certainly a differential consideration. 2. Minimal emphysema with background of very fine centrilobular pulmonary nodularity, most concentrated in the lung apices, consistent with smoking-related respiratory bronchiolitis. 3. Coronary artery disease. Aortic Atherosclerosis (ICD10-I70.0) and Emphysema (ICD10-J43.9). Electronically Signed   By: Jearld Lesch M.D.   On: 01/31/2023 16:30   DG Chest Port 1 View  Result Date: 01/27/2023 CLINICAL DATA:  Status post bronchoscopy. EXAM: PORTABLE CHEST 1 VIEW COMPARISON:  CT chest 01/26/2023 and 01/03/2023 FINDINGS: Heart size normal. Atherosclerotic calcifications are present at the aortic arch. Left hilar fullness is consistent with the known nodule. No other focal opacity or airspace disease present. No edema or effusion is present. The visualized soft tissues and bony thorax are unremarkable. IMPRESSION: 1. Left hilar fullness consistent with the known nodule. 2. No acute cardiopulmonary disease. Electronically Signed   By: Marin Roberts  M.D.   On: 01/27/2023 09:55   DG C-ARM BRONCHOSCOPY  Result Date: 01/27/2023 C-ARM BRONCHOSCOPY: Fluoroscopy was utilized by the requesting physician.  No radiographic interpretation.    Assessment and Plan:   Erica [name] is a 57 y.o. y/o female ***    Dr Wyline Mood  MD,MRCP [facility]) Follow up in ***  BP check ***

## 2023-02-11 ENCOUNTER — Telehealth: Payer: Self-pay

## 2023-02-11 ENCOUNTER — Inpatient Hospital Stay: Payer: Medicaid Other

## 2023-02-11 ENCOUNTER — Inpatient Hospital Stay: Payer: Medicaid Other | Admitting: Physician Assistant

## 2023-02-11 ENCOUNTER — Ambulatory Visit (HOSPITAL_COMMUNITY): Admission: RE | Admit: 2023-02-11 | Payer: Medicaid Other | Source: Ambulatory Visit

## 2023-02-11 ENCOUNTER — Other Ambulatory Visit: Payer: Medicaid Other

## 2023-02-11 ENCOUNTER — Other Ambulatory Visit: Payer: Self-pay | Admitting: Physician Assistant

## 2023-02-11 ENCOUNTER — Ambulatory Visit: Payer: Medicaid Other | Admitting: Oncology

## 2023-02-11 DIAGNOSIS — R911 Solitary pulmonary nodule: Secondary | ICD-10-CM

## 2023-02-11 DIAGNOSIS — K625 Hemorrhage of anus and rectum: Secondary | ICD-10-CM

## 2023-02-11 NOTE — Telephone Encounter (Signed)
This nurse reached out to patient about scheduled appointment for today.  Patient stated that she called and left a message however, she was not fully sure if she called the right number.  Patient states that she has been having "dizzy spells" every time she stands to the point of feeling like she is going to "black out" .  This nurse spoke with provider who advised that patient be evaluated at the emergency room, preferably [facility] or [facility].  Patient acknowledged understanding. This nurse advised that someone will reach out to her to reschedule her office visit and port placement for today.  No further concerns or questions noted at this time.

## 2023-02-14 NOTE — Progress Notes (Signed)
I made multiple attempts to contact Ms Desir today to review new appt made for her to be seen by Dr Mosetta Putt.  She did not answer and the mailbox on her phone is not set up.  I called her daughter, Adella Nissen x1 she did not answer and I did not leave a message.

## 2023-02-15 NOTE — Progress Notes (Signed)
I spoke with Ms Hirschi  I reviewed the new appt for 8/1 labs a t 1015 arrive by 1000 and then 1100 Santiago Glad and Dr Mosetta Putt.   I asked if she went to the ED or urgent care on Friday she said she did not.  She states she checked her blood pressure and it was low.  I strongly encouraged her to keep her appt with [place] on Thursday so we can evaluate her and provied intervention if needed.  All questions were answered.  She verbalized understanding.

## 2023-02-16 ENCOUNTER — Other Ambulatory Visit: Payer: Self-pay | Admitting: Nurse Practitioner

## 2023-02-16 DIAGNOSIS — C20 Malignant neoplasm of rectum: Secondary | ICD-10-CM

## 2023-02-16 NOTE — Progress Notes (Unsigned)
[facility]   [phone] [phone]   Clinic New Consult Note   Patient Care Team: System, Provider Not In as PCP - General Gollan, Tollie Pizza, MD as PCP - Cardiology (Cardiology) Delma Freeze, FNP as Nurse Practitioner (Family Medicine) Antonieta Iba, MD as Consulting Physician (Cardiology) Merwyn Katos, MD (Inactive) as Consulting Physician (Pulmonary Disease) Jim Like, RN as Registered Nurse Scarlett Presto, RN (Inactive) as Registered Nurse Benita Gutter, RN as Oncology Nurse Navigator 02/17/2023  CHIEF COMPLAINTS/PURPOSE OF CONSULTATION:  Rectal cancer, referred by Dr. Janyth Contes  Oncology History  Rectal cancer [facility])  12/15/2022 Pathology Results   Colonoscopy:   Positive for invasive adenocarcinoma, G1 well differentiated      12/15/2022 Procedure   Colonoscopy under the care of Dr. Sharlet Salina   Findings: -The perianal and digital rectal examinations were normal. -Extensive amounts of liquid semi- liquid semi- solid stool was found in the entire colon -A partially obstructing large mass was found in the rectum. The mass was circumferential.   12/23/2022 Initial Diagnosis   Rectal cancer [facility]) Patient was seen by gastroenterology due to hematochezia as well as unintentional weight loss.  12/15/2022, colonoscopy showed fungating partially obstructing large mass in the rectum, circumferential, mass extended from about 15 cm from anus to about 8 cm from anal verge.  Biopsy was taken. Poor preparation of colon.  Colonoscopy was repeated on 12/16/2022, preparations inadequate.  Pathology showed invasive adenocarcinoma.   12/23/2022 Cancer Staging   Staging form: Colon and Rectum, AJCC 8th Edition - Clinical stage from 12/23/2022: Stage Unknown (cTX, cNX) - Signed by Rickard Patience, MD on 12/23/2022 Stage prefix: Initial diagnosis  Staging form: Colon and Rectum, AJCC 8th Edition - Clinical stage from 12/23/2022: Stage IVA (cT3, cN2, pM1a)  - Signed by Rickard Patience, MD on 02/04/2023 Stage prefix: Initial diagnosis   12/23/2022 Tumor Marker   Patient's tumor was tested for the following markers: CEA. Results of the tumor marker test revealed elevated at 18.1.   01/03/2023 Imaging   CT chest abdomen with contrast showed 2.2 cm left upper lobe nodule, suspicious for metastasis. No findings suspicious for metastatic disease in the abdomen. Bilateral adrenal adenomas, benign. Cholelithiasis, without associated inflammatory changes. Aortic Atherosclerosis (ICD10-I70.0) and Emphysema   01/08/2023 Imaging   MRI pelvis without contrast-rectal protocol showed 9.2 cm concentric mid/lower rectal mass, corresponding to the patient's known primary rectal adenocarcinoma, as above. Rectal adenocarcinoma T stage: T3c Rectal adenocarcinoma N stage:  N2 Distance from tumor to the internal anal sphincter is 3.4 cm.   01/27/2023 Procedure   Patient underwent biopsy via bronchoscopy by pulmonology. Biopsy pathology showed adenocarcinoma, Immunohistochemistry is positive with cytokeratin 20 and CDX2 and negative with cytokeratin 7, TTF-1 and napsin A consistent with colonic adenocarcinoma    01/27/2023 Pathology Results   CASE: [id number]   FINAL MICROSCOPIC DIAGNOSIS:  A. LUNG, LUL, FINE NEEDLE ASPIRATION:  Adenocarcinoma  See comment   ADDENDUM:  Immunohistochemistry is positive with cytokeratin 20 and CDX2  and negative with cytokeratin 7, TTF-1 and napsin A consistent with  colonic adenocarcinoma.    03/02/2023 -  Chemotherapy   Patient is on Treatment Plan : COLORECTAL FOLFIRI q14d        HISTORY OF PRESENTING ILLNESS:  Erica [name] 57 y.o. female with PMH including COPD, CHF, DM, HL, HTN, gastric ulcer, pancreatitis, and OSA on nocturnal oxygen is here because of rectal cancer. She presented with rectal bleeding since 05/2022 she initially  thought was hemorrhoidal and progressed to daily. Underwent colonoscopy 12/15/22, showeing a  fungating rectal mass. Path showed G1 invasive adenocarcinoma. Baseline CEA elevated to 18.1 on 12/23/22.  Local staging pelvic MRI 01/03/23 showed a complete circumferential mass measuring 9.2 cm in length extending through muscularis propria (T3c) with approximately 10 perirectal nodes measuring up to 10 mm (N2).  Distance from tumor to the internal anal sphincter is 3.4 cm.  Staging CT CAP showed a 2.2 cm LUL nodule concerning for metastasis, but no other definitive evidence of distant disease. S/p bronchoscopy 01/27/23 of the LUL nodule confirmed adenocarcinoma.  She was seen by Dr. Cathie Hoops who recommended systemic FOLFOX, but patient prefers to be treated in [place].  Socially, she lives independently in her own home and shares land with one of her daughters, she has 2 daughters in total.  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  Due to the nature of her work she has history of heavy alcohol use and tobacco use for 40 years.  Currently drinks up to 6 liquor drinks per day.  At the most she smoked 3-4 per day but was recently given Chantix and plans to quit.  She is motivated to stop drinking alcohol as well.  Family history is significant for mother with metastatic breast cancer, father with metastatic lung cancer, maternal aunt with colon cancer, and maternal uncle with liver cancer.  Today she presents with her daughter.  She weighed close to 200 pounds in 04/2022, but more recently weight has been stable 160-170's.  She has chronic back and knee pain which is stable, but with new rectal discomfort with prolonged sitting.  She has bowel urgency and bloody diarrhea, and bloody discharge with any straining even if not having BM.  She sees a moderate amount of blood each day.  May go to the bathroom once every hour.  Taking stool softener to avoid obstruction.  She has significant baseline neuropathy more in her feet than hands, dropping things, with numbness in her feet and  light tingling in her fingertips.  She is not taking oral iron due to GI upset.  She is out of bed and up at home, letting her dog out, and walking between her house and her daughter's house on the property; much exercise otherwise.  MEDICAL HISTORY:  Past Medical History:  Diagnosis Date   (HFpEF) heart failure with preserved ejection fraction (HCC)    a. 05/2015 Echo: EF 60-65%, no rwma, PASP .   Acute pancreatitis 08/13/2018   Anxiety    Arthritis    Asthma    Bell's palsy    no deficit   Bronchitis    Cancer (HCC) 12/2022   rectal cancer   CHF (congestive heart failure) (HCC)    COPD (chronic obstructive pulmonary disease) (HCC)    Depression    Diabetes mellitus without complication (HCC)    type II 03/2017   Dyspnea    Fatty liver    per patient   Gall stones    per patient   Gastric ulcer    Hyperlipidemia    Hypertension    Lung nodule 12/2022   upper left   OSA (obstructive sleep apnea)    a. did not tolerate CPAP. On oxygen 2L via [place]   Pancreatitis    07/2018   Shoulder injury    6/19   Smoker     SURGICAL HISTORY: Past Surgical History:  Procedure  Laterality Date   BRONCHIAL NEEDLE ASPIRATION BIOPSY  01/27/2023   Procedure: BRONCHIAL NEEDLE ASPIRATION BIOPSIES;  Surgeon: Raechel Chute, MD;  Location: [facility] ENDOSCOPY;  Service: Pulmonary;;   COLONOSCOPY WITH PROPOFOL N/A 12/15/2022   Procedure: COLONOSCOPY WITH PROPOFOL;  Surgeon: Wyline Mood, MD;  Location: [facility] ENDOSCOPY;  Service: Gastroenterology;  Laterality: N/A;   COLONOSCOPY WITH PROPOFOL N/A 12/16/2022   Procedure: COLONOSCOPY WITH PROPOFOL;  Surgeon: Wyline Mood, MD;  Location: [facility] ENDOSCOPY;  Service: Gastroenterology;  Laterality: N/A;   FLEXIBLE BRONCHOSCOPY N/A 06/20/2015   Procedure: FLEXIBLE BRONCHOSCOPY;  Surgeon: Shane Crutch, MD;  Location: [facility] ORS;  Service: Pulmonary;  Laterality: N/A;   KNEE SURGERY Left    8 knee surgeries    SOCIAL HISTORY: Social History    Socioeconomic History   Marital status: Widowed    Spouse name: Not on file   Number of children: Not on file   Years of education: Not on file   Highest education level: Not on file  Occupational History   Occupation: unemployed  Tobacco Use   Smoking status: Every Day    Current packs/day: 3.00    Average packs/day: 3.0 packs/day for 46.0 years (138.0 ttl pk-yrs)    Types: Cigarettes   Smokeless tobacco: Never   Tobacco comments:    0.5PPD 01/13/2023  Vaping Use   Vaping status: Never Used  Substance and Sexual Activity   Alcohol use: Yes    Comment: 6 liquor drinks/day   Drug use: No   Sexual activity: Yes    Birth control/protection: Post-menopausal  Other Topics Concern   Not on file  Social History Narrative   Not on file   Social Determinants of Health   Financial Resource Strain: High Risk (07/18/2017)   Overall Financial Resource Strain (CARDIA)    Difficulty of Paying Living Expenses: Very hard  Food Insecurity: Food Insecurity Present (02/17/2023)   Hunger Vital Sign    Worried About Running Out of Food in the Last Year: Sometimes true    Ran Out of Food in the Last Year: Sometimes true  Transportation Needs: No Transportation Needs (02/17/2023)   PRAPARE - Administrator, Civil Service (Medical): No    Lack of Transportation (Non-Medical): No  Physical Activity: Insufficiently Active (07/18/2017)   Exercise Vital Sign    Days of Exercise per Week: 5 days    Minutes of Exercise per Session: 20 min  Stress: Stress Concern Present (07/18/2017)   [employer] of Occupational Health - Occupational Stress Questionnaire    Feeling of Stress : Very much  Social Connections: Socially Isolated (07/18/2017)   Social Connection and Isolation Panel [NHANES]    Frequency of Communication with Friends and Family: Twice a week    Frequency of Social Gatherings with Friends and Family: Never    Attends Religious Services: Never    Doctor, general practice or Organizations: No    Attends Banker Meetings: Never    Marital Status: Divorced  Catering manager Violence: Not At Risk (02/17/2023)   Humiliation, Afraid, Rape, and Kick questionnaire    Fear of Current or Ex-Partner: No    Emotionally Abused: No    Physically Abused: No    Sexually Abused: No    FAMILY HISTORY: Family History  Problem Relation Age of Onset   Breast cancer Mother        early 42's   Lung cancer Father    Brain cancer Father    Bone cancer Father  Colon cancer Maternal Aunt    Cancer Maternal Uncle        liver?   Diabetes Maternal Grandmother    Asthma Other     ALLERGIES:  is allergic to tramadol and penicillins.  MEDICATIONS:  Current Outpatient Medications  Medication Sig Dispense Refill   albuterol (VENTOLIN HFA) 108 (90 Base) MCG/ACT inhaler Inhale 2 puffs into the lungs every 6 (six) hours as needed for wheezing or shortness of breath.      Blood Glucose Monitoring Suppl (ACCU-CHEK AVIVA PLUS) w/Device KIT See admin instructions.  0   busPIRone (BUSPAR) 30 MG tablet Take 30 mg by mouth 2 (two) times daily. PER PT STATES HER DOSE WAS INCREASED     clonazePAM (KLONOPIN) 0.5 MG tablet Take 0.5 mg by mouth at bedtime as needed for anxiety.     diltiazem (CARDIZEM CD) 120 MG 24 hr capsule TAKE 1 CAPSULE BY MOUTH EVERY DAY 60 capsule 1   diltiazem (CARDIZEM) 30 MG tablet TAKE 1 TABLET(30 MG) BY MOUTH THREE TIMES DAILY AS NEEDED 270 tablet 0   doxycycline (DORYX) 100 MG EC tablet Take 100 mg by mouth 2 (two) times daily.     DULoxetine (CYMBALTA) 60 MG capsule Take 60 mg by mouth 2 (two) times daily.      Fluticasone-Salmeterol (ADVAIR) 250-50 MCG/DOSE AEPB Inhale 1 puff into the lungs 2 (two) times daily.     furosemide (LASIX) 40 MG tablet TAKE 1 TABLET BY MOUTH EVERY DAY. MAY TAKE AN ADDITIONAL TABLET IN THE AM FOR WEIGHT GAIN AS NEEDED 110 tablet 0   JARDIANCE 25 MG TABS tablet Take 25 mg by mouth daily.     metFORMIN  (GLUCOPHAGE-XR) 500 MG 24 hr tablet Take 1,000 mg by mouth 2 (two) times daily.     OXYGEN Inhale 2 L into the lungs at bedtime. Or as needed throughout the day if short of breath     pantoprazole (PROTONIX) 40 MG tablet Take 1 tablet (40 mg total) by mouth daily. 30 tablet 0   potassium chloride SA (KLOR-CON) 20 MEQ tablet Take 20 mEq by mouth 2 (two) times daily.     pregabalin (LYRICA) 150 MG capsule Take 150 mg by mouth 3 (three) times daily.     rosuvastatin (CRESTOR) 20 MG tablet Take 20 mg by mouth at bedtime.     spironolactone (ALDACTONE) 25 MG tablet TAKE 1 TABLET(25 MG) BY MOUTH EVERY MORNING 90 tablet 0   sucralfate (CARAFATE) 1 G tablet Take 1 tablet (1 g total) by mouth 4 (four) times daily -  with meals and at bedtime. (Patient taking differently: Take 1 g by mouth 2 (two) times daily.) 120 tablet 0   tiotropium (SPIRIVA) 18 MCG inhalation capsule Place 18 mcg into inhaler and inhale daily.     tiZANidine (ZANAFLEX) 4 MG tablet Take 8 mg by mouth 3 (three) times daily.     zolpidem (AMBIEN) 10 MG tablet Take 10 mg by mouth at bedtime as needed for sleep.     lidocaine-prilocaine (EMLA) cream Apply to affected area once 30 g 3   ondansetron (ZOFRAN) 8 MG tablet Take 1 tablet (8 mg total) by mouth every 8 (eight) hours as needed for nausea, vomiting or refractory nausea / vomiting. Start on the third day after chemotherapy. 30 tablet 1   prochlorperazine (COMPAZINE) 10 MG tablet Take 1 tablet (10 mg total) by mouth every 6 (six) hours as needed for nausea or vomiting. 30 tablet 1  No current facility-administered medications for this visit.    REVIEW OF SYSTEMS:   Constitutional: Denies fevers, chills or abnormal night sweats (+) weight change Eyes: Denies blurriness of vision, double vision or watery eyes Ears, nose, mouth, throat, and face: Denies mucositis or sore throat Respiratory: Denies cough, dyspnea or wheezes (+) COPD on oxygen Cardiovascular: Denies palpitation, chest  discomfort or lower extremity swelling Gastrointestinal:  Denies nausea, vomiting, constipation, heartburn (+)  change in bowel habits (+) bloody diarrhea/discharge (+) rectal discomfort Skin: Denies abnormal skin rashes Lymphatics: Denies new lymphadenopathy or easy bruising Neurological:Denies new weaknesses (+) neuropathy feet > years Behavioral/Psych: Mood is stable, no new changes  MSK: (+) Chronic back and knee pain All other systems were reviewed with the patient and are negative.  PHYSICAL EXAMINATION: ECOG PERFORMANCE STATUS: 1-2  Vitals:   02/17/23 1102  BP: (!) 146/68  Pulse: 92  Resp: 16  Temp: (!) 97.3 F (36.3 C)  SpO2: 99%   Filed Weights   02/17/23 1102  Weight: 170 lb 11.2 oz (77.4 kg)    GENERAL:alert, no distress and comfortable SKIN: no rashes or significant lesions EYES: sclera clear NECK: Without mass LYMPH:  no palpable inguinal lymphadenopathy  LUNGS: Decreased throughout, with normal breathing effort HEART: regular rate & rhythm, no lower extremity edema ABDOMEN:abdomen soft, non-tender and normal bowel sounds Musculoskeletal:no cyanosis of digits and no clubbing  PSYCH: alert & oriented x 3 with fluent speech NEURO: no focal motor deficits.  Decreased peripheral vibratory sense over the toes per tuning fork exam   LABORATORY DATA:  I have reviewed the data as listed    Latest Ref Rng & Units 02/17/2023   10:09 AM 01/27/2023    6:57 AM 12/23/2022    4:07 PM  CBC  WBC 4.0 - 10.5 K/uL 8.9  8.4  12.6   Hemoglobin 12.0 - 15.0 g/dL 96.0  45.4  09.8   Hematocrit 36.0 - 46.0 % 41.1  45.9  42.1   Platelets 150 - 400 K/uL 277  236  284       Latest Ref Rng & Units 02/17/2023   10:09 AM 01/27/2023    6:57 AM 12/23/2022    4:07 PM  CMP  Glucose 70 - 99 mg/dL 119  147  83   BUN 6 - 20 mg/dL 11  7  9    Creatinine 0.44 - 1.00 mg/dL 8.29  5.62  1.30   Sodium 135 - 145 mmol/L 134  139  139   Potassium 3.5 - 5.1 mmol/L 3.9  3.4  3.8   Chloride 98 - 111  mmol/L 98  104  107   CO2 22 - 32 mmol/L 26  21  23    Calcium 8.9 - 10.3 mg/dL 9.3  9.2  8.5   Total Protein 6.5 - 8.1 g/dL 7.5   6.7   Total Bilirubin 0.3 - 1.2 mg/dL 0.6   0.4   Alkaline Phos 38 - 126 U/L 79   73   AST 15 - 41 U/L 14   17   ALT 0 - 44 U/L 13   11      RADIOGRAPHIC STUDIES: I have personally reviewed the radiological images as listed and agreed with the findings in the report. CT Super D Chest Wo Contrast  Result Date: 01/31/2023 CLINICAL DATA:  Lung nodule suspicious for metastasis, rectal cancer * Tracking Code: BO * EXAM: CT CHEST WITHOUT CONTRAST TECHNIQUE: Multidetector CT imaging of the chest was performed using thin  slice collimation for electromagnetic bronchoscopy planning purposes, without intravenous contrast. RADIATION DOSE REDUCTION: This exam was performed according to the departmental dose-optimization program which includes automated exposure control, adjustment of the mA and/or kV according to patient size and/or use of iterative reconstruction technique. COMPARISON:  01/03/2023 FINDINGS: Cardiovascular: Aortic atherosclerosis. Normal heart size. Left coronary artery calcifications no pericardial effusion. Mediastinum/Nodes: No enlarged mediastinal, hilar, or axillary lymph nodes. Thyroid gland, trachea, and esophagus demonstrate no significant findings. Lungs/Pleura: Unchanged perihilar nodule of the left upper lobe measuring 2.0 x 2.0 cm (series 4, image 71). Scattered adjacent ground-glass. Background of very fine centrilobular pulmonary nodularity, most concentrated in the lung apices. Minimal paraseptal emphysema and diffuse bilateral bronchial wall thickening. No pleural effusion or pneumothorax. Upper Abdomen: No acute abnormality. Macroscopic fat containing bilateral adrenal adenomata, benign, requiring no specific further follow-up or characterization. Musculoskeletal: No chest wall abnormality. No acute osseous findings. IMPRESSION: 1. Unchanged perihilar  nodule of the left upper lobe measuring 2.0 x 2.0 cm. This is primarily suspicious for an incidental, synchronous pulmonary adenocarcinoma, however a solitary pulmonary metastasis is certainly a differential consideration. 2. Minimal emphysema with background of very fine centrilobular pulmonary nodularity, most concentrated in the lung apices, consistent with smoking-related respiratory bronchiolitis. 3. Coronary artery disease. Aortic Atherosclerosis (ICD10-I70.0) and Emphysema (ICD10-J43.9). Electronically Signed   By: Jearld Lesch M.D.   On: 01/31/2023 16:30   DG Chest Port 1 View  Result Date: 01/27/2023 CLINICAL DATA:  Status post bronchoscopy. EXAM: PORTABLE CHEST 1 VIEW COMPARISON:  CT chest 01/26/2023 and 01/03/2023 FINDINGS: Heart size normal. Atherosclerotic calcifications are present at the aortic arch. Left hilar fullness is consistent with the known nodule. No other focal opacity or airspace disease present. No edema or effusion is present. The visualized soft tissues and bony thorax are unremarkable. IMPRESSION: 1. Left hilar fullness consistent with the known nodule. 2. No acute cardiopulmonary disease. Electronically Signed   By: Marin Roberts M.D.   On: 01/27/2023 09:55   DG C-ARM BRONCHOSCOPY  Result Date: 01/27/2023 C-ARM BRONCHOSCOPY: Fluoroscopy was utilized by the requesting physician.  No radiographic interpretation.    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  She presented with hematochezia since 05/2022 -Workup showed a near obstructing rectal mass. Path confirmed adenocarcinoma. Baseline CEA elevated to 18.1.  - Local staging pelvic MRI 01/03/23 showed T3cN2 disease, and lung biopsy confirmed oligo lung metastasis -We reviewed that she has metastatic stage IV rectal cancer, but given the  limited distant metastasis, may still be a candidate for cure using multimodal approach, if she has good tolerance and good response to treatment -We recommend initiating systemic chemo first, with FOLFIRI (rather than FOLFOX due to baseline neuropathy) every 2 weeks for 3-6 months -Chemotherapy consent: Side effects including but not limited to fatigue, nausea, vomiting, the patient/diarrhea, hair ending/loss, neuropathy, fluid retention, renal and liver dysfunction, neutropenic fever, need for blood transfusion, bleeding, were discussed with patient in great detail. She agrees to proceed.  If she is a surgical candidate the goal is curative, if not a surgical candidate the goal is palliative -MMR is normal, not a candidate for immunotherapy we will request [facility] to see if she is a candidate for other targetable therapy -Port placement scheduled 8/9, she will also attended chemo class -We discussed if she has good response to treatment, we may consider  chemoradiation to the primary site, vs surgical resection; as well as resection vs SBRT to the lung met.  She has COPD, not sure if she is a candidate for lung surgery -I referred her to Dr. Mitzi Hansen given the likelihood she will need radiation at some point.  Will refer to cardiothoracic and colorectal surgery after restaging if needed -Pt seen with Dr. Mosetta Putt -F/up with C1 FOLFIRI  Hematochezia, bowel urgency -Secondary to #1 -Using stool softener to avoid obstruction -Today's labs show no anemia, hemoglobin, ferritin 18, TIBC 560, 9% saturation.  Did not tolerate oral iron due to GI upset -I recommend to start prenatal vitamin with iron  Substance abuse -40-year history of alcohol and tobacco use, currently drinks 6 liquor drinks per day.  - We reviewed the negative effects of alcohol and tobacco especially during chemo -I encouraged her to cut back and quit if she can, she declined needing help to quit -She is motivated, she plans to start Chantix  on 8/5  Family history, genetics -Strong family history of cancers including breast, lung, colon, and liver -She would be a good candidate for genetic testing, will discuss more in depth at a later visit -One of her daughters is present and understands the importance of colon cancer screening, she is 58 years old  Co-morbidities -She has multiple comorbidities including COPD, CHF, DM, HTN, OSA on nocturnal oxygen, and baseline neuropathy -Will substitute irinotecan for oxaliplatin given her baseline neuropathy and monitor closely on chemo -Other conditions per PCP  Goals of care -She understands she has stage IV rectal cancer, but limited distant metastatic disease.  We discussed the possibility of cure through multidisciplinary approach if she has good tolerance and good response to treatment -Currently full code    PLAN: -Endoscopy, imaging, path, and today's labs reviewed -Chemo class this week -[facility] Placement 8/9 -Request TEMPUS -Begin Prenatal vitamin -Referral to SW and rad onc -F/up C1 FOLFIRI -Pt seen with Dr. Mosetta Putt and Gardiner Coins, RN navigator    Orders Placed This Encounter  Procedures   CBC with Differential (Cancer Center Only)    Standing Status:   Future    Standing Expiration Date:   03/01/2024   CMP (Cancer Center only)    Standing Status:   Future    Standing Expiration Date:   03/01/2024   CBC with Differential (Cancer Center Only)    Standing Status:   Future    Standing Expiration Date:   03/15/2024   CMP (Cancer Center only)    Standing Status:   Future    Standing Expiration Date:   03/15/2024   CBC with Differential (Cancer Center Only)    Standing Status:   Future    Standing Expiration Date:   03/29/2024   CMP (Cancer Center only)    Standing Status:   Future    Standing Expiration Date:   03/29/2024   CBC with Differential (Cancer Center Only)    Standing Status:   Future    Standing Expiration Date:   04/12/2024   CMP (Cancer Center only)     Standing Status:   Future    Standing Expiration Date:   04/12/2024   Ambulatory referral to Social Work    Referral Priority:   Routine    Referral Type:   Consultation    Referral Reason:   Specialty Services Required    Number of Visits Requested:   1   Ambulatory referral to Radiation Oncology    Referral Priority:  Routine    Referral Type:   Consultation    Referral Reason:   Specialty Services Required    Requested Specialty:   Radiation Oncology    Number of Visits Requested:   1    All questions were answered. The patient knows to call the clinic with any problems, questions or concerns.     Pollyann Samples, NP 02/17/2023   Addendum I have seen the patient, examined her. I agree with the assessment and and plan and have edited the notes.   57 yo female with multiple medical comorbidities, including diabetes, hypertension, COPD and OSA, on nocturnal oxygen, congestive heart failure with preserved EF, was referred by my colleague Dr. Cathie Hoops to transfer her oncology care closer to home.  She recently moved to [place] to live with her daughter.  I reviewed her medical records, and discussed the findings with her and her daughter.  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  MMR normal.  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  I recommended appropriate first-line therapy for her, and I will order NexGen sequencing [facility], to see if she is a candidate for targeted therapy, especially EGFR inhibitor.  If she has asked response to chemotherapy, plan to proceed with concurrent chemoradiation, followed by surgical resection of her primary tumor and multiple lung metastasis.  Certainly if she achieves complete response to neoadjuvant chemo and radiation, will be very reasonable to proceed with watchful wait, and SBRT to her lung  metastasis.  All questions were answered.  She is scheduled for port placement next week, plan to start chemo after port placement. Will refer her to dietician and Child psychotherapist. Will check lab to see if she needs iv iron. Will schedule chemo class.   Malachy Mood MD 02/17/2023

## 2023-02-17 ENCOUNTER — Other Ambulatory Visit: Payer: Self-pay

## 2023-02-17 ENCOUNTER — Encounter: Payer: Self-pay | Admitting: Nurse Practitioner

## 2023-02-17 ENCOUNTER — Encounter: Payer: Self-pay | Admitting: Hematology

## 2023-02-17 ENCOUNTER — Inpatient Hospital Stay: Payer: Medicaid Other | Attending: Oncology | Admitting: Nurse Practitioner

## 2023-02-17 ENCOUNTER — Inpatient Hospital Stay: Payer: Medicaid Other

## 2023-02-17 VITALS — BP 146/68 | HR 92 | Temp 97.3°F | Resp 16 | Ht 66.0 in | Wt 170.7 lb

## 2023-02-17 DIAGNOSIS — K921 Melena: Secondary | ICD-10-CM | POA: Insufficient documentation

## 2023-02-17 DIAGNOSIS — I10 Essential (primary) hypertension: Secondary | ICD-10-CM | POA: Insufficient documentation

## 2023-02-17 DIAGNOSIS — J4489 Other specified chronic obstructive pulmonary disease: Secondary | ICD-10-CM | POA: Insufficient documentation

## 2023-02-17 DIAGNOSIS — Z452 Encounter for adjustment and management of vascular access device: Secondary | ICD-10-CM | POA: Diagnosis not present

## 2023-02-17 DIAGNOSIS — Z5111 Encounter for antineoplastic chemotherapy: Secondary | ICD-10-CM | POA: Diagnosis present

## 2023-02-17 DIAGNOSIS — K625 Hemorrhage of anus and rectum: Secondary | ICD-10-CM

## 2023-02-17 DIAGNOSIS — E1165 Type 2 diabetes mellitus with hyperglycemia: Secondary | ICD-10-CM | POA: Insufficient documentation

## 2023-02-17 DIAGNOSIS — C7802 Secondary malignant neoplasm of left lung: Secondary | ICD-10-CM | POA: Insufficient documentation

## 2023-02-17 DIAGNOSIS — C20 Malignant neoplasm of rectum: Secondary | ICD-10-CM | POA: Diagnosis present

## 2023-02-17 DIAGNOSIS — G629 Polyneuropathy, unspecified: Secondary | ICD-10-CM | POA: Diagnosis not present

## 2023-02-17 DIAGNOSIS — R911 Solitary pulmonary nodule: Secondary | ICD-10-CM

## 2023-02-17 LAB — CMP (CANCER CENTER ONLY)
ALT: 13 U/L (ref 0–44)
AST: 14 U/L — ABNORMAL LOW (ref 15–41)
Albumin: 4.1 g/dL (ref 3.5–5.0)
Alkaline Phosphatase: 79 U/L (ref 38–126)
Anion gap: 10 (ref 5–15)
BUN: 11 mg/dL (ref 6–20)
CO2: 26 mmol/L (ref 22–32)
Calcium: 9.3 mg/dL (ref 8.9–10.3)
Chloride: 98 mmol/L (ref 98–111)
Creatinine: 0.7 mg/dL (ref 0.44–1.00)
GFR, Estimated: >60 mL/min (ref >60)
Glucose, Bld: 236 mg/dL — ABNORMAL HIGH (ref 70–99)
Potassium: 3.9 mmol/L (ref 3.5–5.1)
Sodium: 134 mmol/L — ABNORMAL LOW (ref 135–145)
Total Bilirubin: 0.6 mg/dL (ref 0.3–1.2)
Total Protein: 7.5 g/dL (ref 6.5–8.1)

## 2023-02-17 LAB — CBC WITH DIFFERENTIAL (CANCER CENTER ONLY)
Abs Immature Granulocytes: 0.03 10*3/uL (ref 0.00–0.07)
Basophils Absolute: 0.1 10*3/uL (ref 0.0–0.1)
Basophils Relative: 1 %
Eosinophils Absolute: 0.3 10*3/uL (ref 0.0–0.5)
Eosinophils Relative: 3 %
HCT: 41.1 % (ref 36.0–46.0)
Hemoglobin: 13.8 g/dL (ref 12.0–15.0)
Immature Granulocytes: 0 %
Lymphocytes Relative: 22 %
Lymphs Abs: 2 10*3/uL (ref 0.7–4.0)
MCH: 31.1 pg (ref 26.0–34.0)
MCHC: 33.6 g/dL (ref 30.0–36.0)
MCV: 92.6 fL (ref 80.0–100.0)
Monocytes Absolute: 0.9 10*3/uL (ref 0.1–1.0)
Monocytes Relative: 11 %
Neutro Abs: 5.6 10*3/uL (ref 1.7–7.7)
Neutrophils Relative %: 63 %
Platelet Count: 277 10*3/uL (ref 150–400)
RBC: 4.44 MIL/uL (ref 3.87–5.11)
RDW: 17.8 % — ABNORMAL HIGH (ref 11.5–15.5)
WBC Count: 8.9 10*3/uL (ref 4.0–10.5)
nRBC: 0 % (ref 0.0–0.2)

## 2023-02-17 LAB — CEA (ACCESS): CEA (CHCC): 18.82 ng/mL — ABNORMAL HIGH (ref 0.00–5.00)

## 2023-02-17 LAB — IRON AND IRON BINDING CAPACITY (CC-WL,HP ONLY)
Iron: 49 ug/dL (ref 28–170)
Saturation Ratios: 9 % — ABNORMAL LOW (ref 10.4–31.8)
TIBC: 560 ug/dL — ABNORMAL HIGH (ref 250–450)
UIBC: 511 ug/dL — ABNORMAL HIGH (ref 148–442)

## 2023-02-17 LAB — FERRITIN: Ferritin: 18 ng/mL (ref 11–307)

## 2023-02-17 MED ORDER — ONDANSETRON HCL 8 MG PO TABS: 8.0000 mg | ORAL_TABLET | Freq: Three times a day (TID) | ORAL | 1 refills | Status: DC | PRN

## 2023-02-17 MED ORDER — LIDOCAINE-PRILOCAINE 2.5-2.5 % EX CREA
TOPICAL_CREAM | CUTANEOUS | 3 refills | Status: DC
Start: 2023-02-17 — End: 2023-11-27

## 2023-02-17 MED ORDER — PROCHLORPERAZINE MALEATE 10 MG PO TABS: 10.0000 mg | ORAL_TABLET | Freq: Four times a day (QID) | ORAL | 1 refills | Status: DC | PRN

## 2023-02-17 NOTE — Progress Notes (Signed)
START OFF PATHWAY REGIMEN - Colorectal   [id number] (Leucovorin IV D1 + Fluorouracil IV D1/CIV D1,2 + Irinotecan IV D1) q14 Days:   A cycle is every 14 days:     Irinotecan      Leucovorin      Fluorouracil      Fluorouracil   **Always confirm dose/schedule in your pharmacy ordering system**  Patient Characteristics: Distant Metastases, Resectable, Neoadjuvant Therapy Planned Tumor Location: Rectal Therapeutic Status: Distant Metastases  Intent of Therapy: Curative Intent, Not Discussed with Patient

## 2023-02-17 NOTE — Progress Notes (Signed)
I met with Ms Halas and her daughter after  her consultation with Santiago Glad, NP and  Dr Mosetta Putt.  I explained my role as a nurse navigator and provided my contact information. I explained the services provided at [facility] and provided written information.  I explained the alight grant and let  her know she will receive additional information about the grant at the time of her chemo education class. I briefly explained insertion and care of a port a cath.  I showed a sample of the port a cath.   I told her that she will be scheduled for chemotherapy education class prior to receiving chemotherapy.  I told her our schedulers will call her with those appts.  All questions were answered. She verbalized understanding.

## 2023-02-18 ENCOUNTER — Other Ambulatory Visit: Payer: Self-pay

## 2023-02-18 ENCOUNTER — Encounter: Payer: Self-pay | Admitting: Hematology

## 2023-02-18 ENCOUNTER — Inpatient Hospital Stay: Payer: Medicaid Other | Admitting: Licensed Clinical Social Worker

## 2023-02-18 DIAGNOSIS — C20 Malignant neoplasm of rectum: Secondary | ICD-10-CM

## 2023-02-18 NOTE — Progress Notes (Signed)
NGS testing ordered via [employer] on line portal.  Ord [id number].

## 2023-02-18 NOTE — Progress Notes (Signed)
[facility] CSW Progress Note  Clinical Child psychotherapist contacted patient by phone to introduce self.  Pt assessed by CSW colleague while undergoing diagnostics at [facility] w/ pt deciding to have treatment at [facility].  Pt states she has moved in with her daughter in [place] who will be providing support while pt goes through treatment.  Per pt her daughter's schedule is flexible and pt also has a friend locally who will be able to assist as well.  Pt informed of the [employer] and application process once treatment starts for financial assistance as well as the food pantry located in supportive services.  Pt has contact information for CSW should she have additional needs while undergoing treatment.      Rachel Moulds, LCSW Clinical Social Worker [facility]    Patient is participating in a Managed Medicaid Plan:  Yes

## 2023-02-19 ENCOUNTER — Other Ambulatory Visit: Payer: Self-pay

## 2023-02-21 ENCOUNTER — Encounter: Payer: Self-pay | Admitting: Hematology

## 2023-02-21 NOTE — Progress Notes (Signed)
Radiation Oncology         [phone] ________________________________  Initial Outpatient Consultation - Conducted via telephone at patient request.  I spoke with the patient to conduct this consult visit via telephone. The patient was notified in advance and was offered an in person or telemedicine meeting to allow for face to face communication but instead preferred to proceed with a telephone consult.   Name: Erica [name]        MRN: [id number]  Date of Service: 02/24/2023 DOB: May 12, 1966  EG:[id number], Provider Not In  Malachy Mood, MD     REFERRING PHYSICIAN: Malachy Mood, MD   DIAGNOSIS: The encounter diagnosis was Rectal cancer [facility]).   HISTORY OF PRESENT ILLNESS: Erica [name] is a 57 y.o. female seen at the request of Dr. Mosetta Putt for a diagnosis of metastatic rectal cancer.  The patient was evaluated with rectal bleeding that began in November 2023 and with progressive changes was referred to GI.  She underwent colonoscopy on 12/15/2022 which showed a fungating mass in the lower rectum and pathology showed invasive adenocarcinoma her CEA was 18.1 on 12/23/2022, and an MRI of the pelvis on 02/01/2023 showed a 9.2 cm concentric mid to lower rectal mass, T staging was T3c with right anterior extension through the muscularis, and approximately 10 perirectal lymph nodes were noted classifying her as an 2 disease, the distance of the tumor from the internal anal sphincter was 3.4 cm, up CT scan of the CAP showed a 2.2 cm left upper lobe nodule suspicious for metastasis, and no other evidence of metastatic disease.  She underwent bronchoscopy on 01/27/2023, and cytology confirmed adenocarcinoma consistent with a gastrointestinal primary.  She met with Dr. Mosetta Putt and is to begin systemic chemotherapy with FOLFIRI on 03/03/2023.  She is seen to discuss possible options of future radiation to the pelvis and or stereotactic body radiation to her lung metastasis.    PREVIOUS RADIATION THERAPY: No   PAST  MEDICAL HISTORY:  Past Medical History:  Diagnosis Date   (HFpEF) heart failure with preserved ejection fraction (HCC)    a. 05/2015 Echo: EF 60-65%, no rwma, PASP .   Acute pancreatitis 08/13/2018   Anxiety    Arthritis    Asthma    Bell's palsy    no deficit   Bronchitis    Cancer (HCC) 12/2022   rectal cancer   CHF (congestive heart failure) (HCC)    COPD (chronic obstructive pulmonary disease) (HCC)    Depression    Diabetes mellitus without complication (HCC)    type II 03/2017   Dyspnea    Fatty liver    per patient   Gall stones    per patient   Gastric ulcer    Hyperlipidemia    Hypertension    Lung nodule 12/2022   upper left   OSA (obstructive sleep apnea)    a. did not tolerate CPAP. On oxygen 2L via [place]   Pancreatitis    07/2018   Shoulder injury    6/19   Smoker        PAST SURGICAL HISTORY: Past Surgical History:  Procedure Laterality Date   BRONCHIAL NEEDLE ASPIRATION BIOPSY  01/27/2023   Procedure: BRONCHIAL NEEDLE ASPIRATION BIOPSIES;  Surgeon: Raechel Chute, MD;  Location: [facility] ENDOSCOPY;  Service: Pulmonary;;   COLONOSCOPY WITH PROPOFOL N/A 12/15/2022   Procedure: COLONOSCOPY WITH PROPOFOL;  Surgeon: Wyline Mood, MD;  Location: [facility] ENDOSCOPY;  Service: Gastroenterology;  Laterality: N/A;   COLONOSCOPY WITH PROPOFOL  N/A 12/16/2022   Procedure: COLONOSCOPY WITH PROPOFOL;  Surgeon: Wyline Mood, MD;  Location: [facility] ENDOSCOPY;  Service: Gastroenterology;  Laterality: N/A;   FLEXIBLE BRONCHOSCOPY N/A 06/20/2015   Procedure: FLEXIBLE BRONCHOSCOPY;  Surgeon: Shane Crutch, MD;  Location: [facility] ORS;  Service: Pulmonary;  Laterality: N/A;   KNEE SURGERY Left    8 knee surgeries     FAMILY HISTORY:  Family History  Problem Relation Age of Onset   Breast cancer Mother        early 59's   Lung cancer Father    Brain cancer Father    Bone cancer Father    Colon cancer Maternal Aunt    Cancer Maternal Uncle        liver?   Diabetes Maternal  Grandmother    Asthma Other      SOCIAL HISTORY:  reports that she has been smoking cigarettes. She has a 138 pack-year smoking history. She has never used smokeless tobacco. She reports current alcohol use. She reports that she does not use drugs.  The patient is widowed and lives in [place].  She has 2 adult daughters who live on the same property and she feels supported and looked after.   ALLERGIES: Tramadol and Penicillins   MEDICATIONS:  Current Outpatient Medications  Medication Sig Dispense Refill   albuterol (VENTOLIN HFA) 108 (90 Base) MCG/ACT inhaler Inhale 2 puffs into the lungs every 6 (six) hours as needed for wheezing or shortness of breath.      Blood Glucose Monitoring Suppl (ACCU-CHEK AVIVA PLUS) w/Device KIT See admin instructions.  0   busPIRone (BUSPAR) 30 MG tablet Take 30 mg by mouth 2 (two) times daily. PER PT STATES HER DOSE WAS INCREASED     clonazePAM (KLONOPIN) 0.5 MG tablet Take 0.5 mg by mouth at bedtime as needed for anxiety.     diltiazem (CARDIZEM CD) 120 MG 24 hr capsule TAKE 1 CAPSULE BY MOUTH EVERY DAY 60 capsule 1   diltiazem (CARDIZEM) 30 MG tablet TAKE 1 TABLET(30 MG) BY MOUTH THREE TIMES DAILY AS NEEDED 270 tablet 0   DULoxetine (CYMBALTA) 60 MG capsule Take 60 mg by mouth 2 (two) times daily.      Fluticasone-Salmeterol (ADVAIR) 250-50 MCG/DOSE AEPB Inhale 1 puff into the lungs 2 (two) times daily.     furosemide (LASIX) 40 MG tablet TAKE 1 TABLET BY MOUTH EVERY DAY. MAY TAKE AN ADDITIONAL TABLET IN THE AM FOR WEIGHT GAIN AS NEEDED 110 tablet 0   JARDIANCE 25 MG TABS tablet Take 25 mg by mouth daily.     lidocaine-prilocaine (EMLA) cream Apply to affected area once 30 g 3   metFORMIN (GLUCOPHAGE-XR) 500 MG 24 hr tablet Take 1,000 mg by mouth 2 (two) times daily.     ondansetron (ZOFRAN) 8 MG tablet Take 1 tablet (8 mg total) by mouth every 8 (eight) hours as needed for nausea, vomiting or refractory nausea / vomiting. Start on the third day after  chemotherapy. 30 tablet 1   OXYGEN Inhale 2 L into the lungs at bedtime. Or as needed throughout the day if short of breath     pantoprazole (PROTONIX) 40 MG tablet Take 1 tablet (40 mg total) by mouth daily. 30 tablet 0   potassium chloride SA (KLOR-CON) 20 MEQ tablet Take 20 mEq by mouth 2 (two) times daily.     pregabalin (LYRICA) 150 MG capsule Take 150 mg by mouth 3 (three) times daily.     prochlorperazine (COMPAZINE) 10 MG  tablet Take 1 tablet (10 mg total) by mouth every 6 (six) hours as needed for nausea or vomiting. 30 tablet 1   rosuvastatin (CRESTOR) 20 MG tablet Take 20 mg by mouth at bedtime.     spironolactone (ALDACTONE) 25 MG tablet TAKE 1 TABLET(25 MG) BY MOUTH EVERY MORNING 90 tablet 0   sucralfate (CARAFATE) 1 G tablet Take 1 tablet (1 g total) by mouth 4 (four) times daily -  with meals and at bedtime. (Patient taking differently: Take 1 g by mouth 2 (two) times daily.) 120 tablet 0   tiotropium (SPIRIVA) 18 MCG inhalation capsule Place 18 mcg into inhaler and inhale daily.     tiZANidine (ZANAFLEX) 4 MG tablet Take 8 mg by mouth 3 (three) times daily.     zolpidem (AMBIEN) 10 MG tablet Take 10 mg by mouth at bedtime as needed for sleep.     No current facility-administered medications for this encounter.     REVIEW OF SYSTEMS: On review of systems, the patient reports that she is having pelvic fullness and occasional bleeding with bowel movements. No other complaints are verbalized at this time.       PHYSICAL EXAM:  Unable to assess due to encounter types   ECOG = 1  0 - Asymptomatic (Fully active, able to carry on all predisease activities without restriction)  1 - Symptomatic but completely ambulatory (Restricted in physically strenuous activity but ambulatory and able to carry out work of a light or sedentary nature. For example, light housework, office work)  2 - Symptomatic, <50% in bed during the day (Ambulatory and capable of all self care but unable to  carry out any work activities. Up and about more than 50% of waking hours)  3 - Symptomatic, >50% in bed, but not bedbound (Capable of only limited self-care, confined to bed or chair 50% or more of waking hours)  4 - Bedbound (Completely disabled. Cannot carry on any self-care. Totally confined to bed or chair)  5 - Death   Santiago Glad MM, Creech RH, Tormey DC, et al. [phone]). "Toxicity and response criteria of the [facility]". Am. Evlyn Clines. Oncol. 5 (6): 649-55    LABORATORY DATA:  Lab Results  Component Value Date   WBC 8.9 02/17/2023   HGB 13.8 02/17/2023   HCT 41.1 02/17/2023   MCV 92.6 02/17/2023   PLT 277 02/17/2023   Lab Results  Component Value Date   NA 134 (L) 02/17/2023   K 3.9 02/17/2023   CL 98 02/17/2023   CO2 26 02/17/2023   Lab Results  Component Value Date   ALT 13 02/17/2023   AST 14 (L) 02/17/2023   ALKPHOS 79 02/17/2023   BILITOT 0.6 02/17/2023      RADIOGRAPHY: CT Super D Chest Wo Contrast  Result Date: 01/31/2023 CLINICAL DATA:  Lung nodule suspicious for metastasis, rectal cancer * Tracking Code: BO * EXAM: CT CHEST WITHOUT CONTRAST TECHNIQUE: Multidetector CT imaging of the chest was performed using thin slice collimation for electromagnetic bronchoscopy planning purposes, without intravenous contrast. RADIATION DOSE REDUCTION: This exam was performed according to the departmental dose-optimization program which includes automated exposure control, adjustment of the mA and/or kV according to patient size and/or use of iterative reconstruction technique. COMPARISON:  01/03/2023 FINDINGS: Cardiovascular: Aortic atherosclerosis. Normal heart size. Left coronary artery calcifications no pericardial effusion. Mediastinum/Nodes: No enlarged mediastinal, hilar, or axillary lymph nodes. Thyroid gland, trachea, and esophagus demonstrate no significant findings. Lungs/Pleura: Unchanged perihilar nodule of  the left upper lobe measuring 2.0 x 2.0 cm  (series 4, image 71). Scattered adjacent ground-glass. Background of very fine centrilobular pulmonary nodularity, most concentrated in the lung apices. Minimal paraseptal emphysema and diffuse bilateral bronchial wall thickening. No pleural effusion or pneumothorax. Upper Abdomen: No acute abnormality. Macroscopic fat containing bilateral adrenal adenomata, benign, requiring no specific further follow-up or characterization. Musculoskeletal: No chest wall abnormality. No acute osseous findings. IMPRESSION: 1. Unchanged perihilar nodule of the left upper lobe measuring 2.0 x 2.0 cm. This is primarily suspicious for an incidental, synchronous pulmonary adenocarcinoma, however a solitary pulmonary metastasis is certainly a differential consideration. 2. Minimal emphysema with background of very fine centrilobular pulmonary nodularity, most concentrated in the lung apices, consistent with smoking-related respiratory bronchiolitis. 3. Coronary artery disease. Aortic Atherosclerosis (ICD10-I70.0) and Emphysema (ICD10-J43.9). Electronically Signed   By: Jearld Lesch M.D.   On: 01/31/2023 16:30   DG Chest Port 1 View  Result Date: 01/27/2023 CLINICAL DATA:  Status post bronchoscopy. EXAM: PORTABLE CHEST 1 VIEW COMPARISON:  CT chest 01/26/2023 and 01/03/2023 FINDINGS: Heart size normal. Atherosclerotic calcifications are present at the aortic arch. Left hilar fullness is consistent with the known nodule. No other focal opacity or airspace disease present. No edema or effusion is present. The visualized soft tissues and bony thorax are unremarkable. IMPRESSION: 1. Left hilar fullness consistent with the known nodule. 2. No acute cardiopulmonary disease. Electronically Signed   By: Marin Roberts M.D.   On: 01/27/2023 09:55   DG C-ARM BRONCHOSCOPY  Result Date: 01/27/2023 C-ARM BRONCHOSCOPY: Fluoroscopy was utilized by the requesting physician.  No radiographic interpretation.        IMPRESSION/PLAN: 1. Stage IV, [id number],  adenocarcinoma of the rectum with left upper lobe metastasis. Dr. Mitzi Hansen discusses the pathology findings and reviews the nature of metastatic rectal cancer. She is going to begin systemic chemotherapy. She would be a good candidate for SBRT to the LUL nodule if she is not a surgical candidate. If she were to proceed this would likely be several cycles of chemo from now and would be 3-5 fractions of treatment. We also discussed the possibility of chemoradiation to the pelvis several months from now depending on her response to therapy as well.  We discussed the risks, benefits, short, and long term effects of radiotherapy, as well as the curative intent, and the patient is interested in proceeding with chemoradiation to the pelvis in the future if she is eligible and we will follow up with the decisions she makes with cardiothoracic surgery as she pursues chemotherapy.      This encounter was conducted via telephone.  The patient has provided two factor identification and has given verbal consent for this type of encounter and has been advised to only accept a meeting of this type in a secure network environment. The time spent during this encounter was 60 minutes including preparation, discussion, and coordination of the patient's care. The attendants for this meeting include Rober Minion, RN, Dr. Mitzi Hansen, Ronny Bacon  and [name].  During the encounter,  Rober Minion, RN, Dr. Mitzi Hansen, and Ronny Bacon were located at [facility] Radiation Oncology Department.  SHAELY PROPER was located at home.    The above documentation reflects my direct findings during this shared patient visit. Please see the separate note by Dr. Mitzi Hansen on this date for the remainder of the patient's plan of care.    Osker Mason, [facility]   **Disclaimer: This note  was dictated with voice recognition software. Similar sounding words can  inadvertently be transcribed and this note may contain transcription errors which may not have been corrected upon publication of note.**

## 2023-02-23 ENCOUNTER — Inpatient Hospital Stay (HOSPITAL_BASED_OUTPATIENT_CLINIC_OR_DEPARTMENT_OTHER): Payer: Medicaid Other

## 2023-02-23 ENCOUNTER — Other Ambulatory Visit: Payer: Self-pay

## 2023-02-23 DIAGNOSIS — C20 Malignant neoplasm of rectum: Secondary | ICD-10-CM

## 2023-02-23 NOTE — Progress Notes (Signed)
I spoke with Erica [name].  I let her know recommendation from tumor board was for her to be evaluated by colorectal surgeon.  I related this to her. She was agreeable.  I told her [facility] will be calling her in the next couple of days to schedule that appt.  All questions were answered.  She verbalized understanding.

## 2023-02-23 NOTE — Progress Notes (Signed)
GI Location of Tumor / Histology: Rectal Cancer with lung mets  Erica [name] reports rectal bleeding since late 2023.  Bronchoscopy 01/27/2023:   CT CAP 01/03/2023: 2.2 cm left upper lobe lung nodule, suspicious for metastasis, and no other evidence of metastatic disease.  MRI Pelvis 01/03/2023:  9.2 cm concentric mid/lower rectal mass, corresponding to the patient's known primary rectal adenocarcinoma, as above.   Rectal adenocarcinoma T stage: T3c   Rectal adenocarcinoma N stage:  N2   Distance from tumor to the internal anal sphincter is 3.4 cm.   Colonoscopy 12/15/2022: Fungating mass in the lower rectum.    Biopsies of LUL Lung 01/27/2023   Past/Anticipated interventions by surgeon, if any:   Past/Anticipated interventions by medical oncology, if any:  Santiago Glad NP / Dr. Mosetta Putt  02/17/2023 -Workup showed a near obstructing rectal mass. Path confirmed adenocarcinoma. Baseline CEA elevated to 18.1.  - Local staging pelvic MRI 01/03/23 showed T3cN2 disease, and lung biopsy confirmed oligo lung metastasis -We reviewed that she has metastatic stage IV rectal cancer, but given the limited distant metastasis, may still be a candidate for cure using multimodal approach, if she has good tolerance and good response to treatment -We recommend initiating systemic chemo first, with FOLFIRI (rather than FOLFOX due to baseline neuropathy) every 2 weeks for 3-6 months. -We discussed if she has good response to treatment, we may consider chemoradiation to the primary site, vs surgical resection; as well as resection vs SBRT to the lung met.  -I referred her to Dr. Mitzi Hansen given the likelihood she will need radiation at some point.  Will refer to cardiothoracic and colorectal surgery after restaging if needed.    Weight changes, if any:   Bowel/Bladder complaints, if any:   Nausea / Vomiting, if any:   Pain issues, if any:    Any blood per rectum:     SAFETY ISSUES: Prior radiation?   Pacemaker/ICD?  Possible current pregnancy? Postmenopausal Is the patient on methotrexate?   Current Complaints/Details:

## 2023-02-23 NOTE — Progress Notes (Signed)
The proposed treatment discussed in conference is for discussion purpose only and is not a binding recommendation.  The patients have not been physically examined, or presented with their treatment options.  Therefore, final treatment plans cannot be decided.  

## 2023-02-23 NOTE — Progress Notes (Signed)
Referral, pathology report, demographics, insurance info, and consultation note faxed to [facility] per tumor board recommendations

## 2023-02-24 ENCOUNTER — Ambulatory Visit
Admission: RE | Admit: 2023-02-24 | Discharge: 2023-02-24 | Disposition: A | Discharge status: Home / Self Care | Payer: Medicaid Other | Source: Ambulatory Visit | Attending: Radiation Oncology | Admitting: Radiation Oncology

## 2023-02-24 ENCOUNTER — Other Ambulatory Visit: Payer: Self-pay | Admitting: Radiology

## 2023-02-24 ENCOUNTER — Encounter: Payer: Self-pay | Admitting: Radiation Oncology

## 2023-02-24 VITALS — Ht 66.0 in | Wt 170.0 lb

## 2023-02-24 DIAGNOSIS — C20 Malignant neoplasm of rectum: Secondary | ICD-10-CM

## 2023-02-24 NOTE — Progress Notes (Signed)
Pharmacist Chemotherapy Monitoring - Initial Assessment    Anticipated start date: 03/03/23   The following has been reviewed per standard work regarding the patient's treatment regimen: The patient's diagnosis, treatment plan and drug doses, and organ/hematologic function Lab orders and baseline tests specific to treatment regimen  The treatment plan start date, drug sequencing, and pre-medications Prior authorization status  Patient's documented medication list, including drug-drug interaction screen and prescriptions for anti-emetics and supportive care specific to the treatment regimen The drug concentrations, fluid compatibility, administration routes, and timing of the medications to be used The patient's access for treatment and lifetime cumulative dose history, if applicable  The patient's medication allergies and previous infusion related reactions, if applicable   Changes made to treatment plan:  N/A  Follow up needed:  N/A   Demetrius Charity, RPH, 02/24/2023  2:37 PM

## 2023-02-25 ENCOUNTER — Ambulatory Visit (HOSPITAL_COMMUNITY)
Admission: RE | Admit: 2023-02-25 | Discharge: 2023-02-25 | Disposition: A | Discharge status: Home / Self Care | Payer: Medicaid Other | Source: Ambulatory Visit | Attending: Oncology | Admitting: Oncology

## 2023-02-25 ENCOUNTER — Encounter (HOSPITAL_COMMUNITY): Payer: Self-pay

## 2023-02-25 ENCOUNTER — Other Ambulatory Visit: Payer: Self-pay

## 2023-02-25 DIAGNOSIS — C20 Malignant neoplasm of rectum: Secondary | ICD-10-CM

## 2023-02-25 DIAGNOSIS — Z419 Encounter for procedure for purposes other than remedying health state, unspecified: Secondary | ICD-10-CM

## 2023-02-25 DIAGNOSIS — C19 Malignant neoplasm of rectosigmoid junction: Secondary | ICD-10-CM | POA: Insufficient documentation

## 2023-02-25 HISTORY — PX: IR IMAGING GUIDED PORT INSERTION: IMG5740

## 2023-02-25 LAB — GLUCOSE, CAPILLARY: Glucose-Capillary: 201 mg/dL — ABNORMAL HIGH (ref 70–99)

## 2023-02-25 MED ORDER — FENTANYL CITRATE (PF) 100 MCG/2ML IJ SOLN
INTRAMUSCULAR | Status: AC
  Filled 2023-02-25: qty 2

## 2023-02-25 MED ORDER — HEPARIN SOD (PORK) LOCK FLUSH 100 UNIT/ML IV SOLN
INTRAVENOUS | Status: AC
  Filled 2023-02-25: qty 5

## 2023-02-25 MED ORDER — MIDAZOLAM HCL 2 MG/2ML IJ SOLN
INTRAMUSCULAR | Status: AC
  Filled 2023-02-25: qty 2

## 2023-02-25 MED ORDER — MIDAZOLAM HCL 2 MG/2ML IJ SOLN
INTRAMUSCULAR | Status: AC | PRN
  Administered 2023-02-25: 1 mg via INTRAVENOUS
  Administered 2023-02-25 (×2): .5 mg via INTRAVENOUS
  Administered 2023-02-25 (×2): 1 mg via INTRAVENOUS

## 2023-02-25 MED ORDER — SODIUM CHLORIDE 0.9 % IV SOLN: INTRAVENOUS | Status: DC

## 2023-02-25 MED ORDER — FENTANYL CITRATE (PF) 100 MCG/2ML IJ SOLN
INTRAMUSCULAR | Status: AC | PRN
  Administered 2023-02-25 (×2): 50 ug via INTRAVENOUS
  Administered 2023-02-25: 25 ug via INTRAVENOUS
  Administered 2023-02-25: 50 ug via INTRAVENOUS
  Administered 2023-02-25: 25 ug via INTRAVENOUS

## 2023-02-25 MED ORDER — LIDOCAINE HCL 1 % IJ SOLN
INTRAMUSCULAR | Status: AC
  Filled 2023-02-25: qty 20

## 2023-02-25 NOTE — Procedures (Signed)
Interventional Radiology Procedure:   Indications: Rectal cancer  Procedure: Port placement  Findings: Right jugular port, tip at SVC/RA junction  Complications: None     EBL: Minimal, less than 10 ml  Plan: Discharge in one hour.  Keep port site and incisions dry for at least 24 hours.     Adam R. Henn, MD  Pager: [phone]   

## 2023-02-25 NOTE — H&P (Signed)
Chief Complaint: Patient was seen in consultation today for [facility] a cath placement at the request of Yu,Zhou  Referring Physician(s): Yu,Zhou  Supervising Physician: Richarda Overlie  Patient Status: [facility] - Out-pt  History of Present Illness: Erica [name] is a 57 y.o. female   FULL Code status per pt Colorectal cancer - newly diagnosed Presented with rectal bleeding Follows with Dr Mosetta Putt +colonoscopy 12/14/2012 Pulmonary nodule also noted in work up---+adenocarcinoma; consistent with colonic adenocarcinoma Scheduled for chemotherapy to begin next week Now for [facility] a cath placement in IR    Past Medical History:  Diagnosis Date   (HFpEF) heart failure with preserved ejection fraction (HCC)    a. 05/2015 Echo: EF 60-65%, no rwma, PASP .   Acute pancreatitis 08/13/2018   Anxiety    Arthritis    Asthma    Bell's palsy    no deficit   Bronchitis    Cancer (HCC) 12/2022   rectal cancer   CHF (congestive heart failure) (HCC)    COPD (chronic obstructive pulmonary disease) (HCC)    Depression    Diabetes mellitus without complication (HCC)    type II 03/2017   Dyspnea    Fatty liver    per patient   Gall stones    per patient   Gastric ulcer    Hyperlipidemia    Hypertension    Lung nodule 12/2022   upper left   OSA (obstructive sleep apnea)    a. did not tolerate CPAP. On oxygen 2L via [facility]   Pancreatitis    07/2018   Shoulder injury    6/19   Smoker     Past Surgical History:  Procedure Laterality Date   BRONCHIAL NEEDLE ASPIRATION BIOPSY  01/27/2023   Procedure: BRONCHIAL NEEDLE ASPIRATION BIOPSIES;  Surgeon: Raechel Chute, MD;  Location: [facility] ENDOSCOPY;  Service: Pulmonary;;   COLONOSCOPY WITH PROPOFOL N/A 12/15/2022   Procedure: COLONOSCOPY WITH PROPOFOL;  Surgeon: Wyline Mood, MD;  Location: [facility] ENDOSCOPY;  Service: Gastroenterology;  Laterality: N/A;   COLONOSCOPY WITH PROPOFOL N/A 12/16/2022   Procedure: COLONOSCOPY WITH PROPOFOL;  Surgeon: Wyline Mood, MD;  Location: [facility] ENDOSCOPY;  Service: Gastroenterology;  Laterality: N/A;   FLEXIBLE BRONCHOSCOPY N/A 06/20/2015   Procedure: FLEXIBLE BRONCHOSCOPY;  Surgeon: Shane Crutch, MD;  Location: [facility] ORS;  Service: Pulmonary;  Laterality: N/A;   KNEE SURGERY Left    8 knee surgeries    Allergies: Tramadol and Penicillins  Medications: Prior to Admission medications   Medication Sig Start Date End Date Taking? Authorizing Provider  albuterol (VENTOLIN HFA) 108 (90 Base) MCG/ACT inhaler Inhale 2 puffs into the lungs every 6 (six) hours as needed for wheezing or shortness of breath.     [provider]  Blood Glucose Monitoring Suppl (ACCU-CHEK AVIVA PLUS) w/Device KIT See admin instructions. 10/20/17   [provider]  busPIRone (BUSPAR) 30 MG tablet Take 30 mg by mouth 2 (two) times daily. PER PT STATES HER DOSE WAS INCREASED 02/14/20   [provider]  clonazePAM (KLONOPIN) 0.5 MG tablet Take 0.5 mg by mouth at bedtime as needed for anxiety. 03/12/20   [provider]  diltiazem (CARDIZEM CD) 120 MG 24 hr capsule TAKE 1 CAPSULE BY MOUTH EVERY DAY 01/05/23   Antonieta Iba, MD  diltiazem (CARDIZEM) 30 MG tablet TAKE 1 TABLET(30 MG) BY MOUTH THREE TIMES DAILY AS NEEDED 12/03/22   Antonieta Iba, MD  DULoxetine (CYMBALTA) 60 MG capsule Take 60 mg by mouth 2 (two)  times daily.     [provider]  Fluticasone-Salmeterol (ADVAIR) 250-50 MCG/DOSE AEPB Inhale 1 puff into the lungs 2 (two) times daily.    [provider]  furosemide (LASIX) 40 MG tablet TAKE 1 TABLET BY MOUTH EVERY DAY. MAY TAKE AN ADDITIONAL TABLET IN THE AM FOR WEIGHT GAIN AS NEEDED 12/24/22   Antonieta Iba, MD  JARDIANCE 25 MG TABS tablet Take 25 mg by mouth daily. 08/18/21   [provider]  lidocaine-prilocaine (EMLA) cream Apply to affected area once 02/17/23   Malachy Mood, MD  metFORMIN (GLUCOPHAGE-XR) 500 MG 24 hr tablet Take 1,000 mg by mouth 2 (two) times  daily. 10/21/22   [provider]  ondansetron (ZOFRAN) 8 MG tablet Take 1 tablet (8 mg total) by mouth every 8 (eight) hours as needed for nausea, vomiting or refractory nausea / vomiting. Start on the third day after chemotherapy. 02/17/23   Malachy Mood, MD  OXYGEN Inhale 2 L into the lungs at bedtime. Or as needed throughout the day if short of breath    [provider]  pantoprazole (PROTONIX) 40 MG tablet Take 1 tablet (40 mg total) by mouth daily. 05/04/18   Darci Current, MD  potassium chloride SA (KLOR-CON) 20 MEQ tablet Take 20 mEq by mouth 2 (two) times daily. 02/15/20   [provider]  pregabalin (LYRICA) 150 MG capsule Take 150 mg by mouth 3 (three) times daily.    [provider]  prochlorperazine (COMPAZINE) 10 MG tablet Take 1 tablet (10 mg total) by mouth every 6 (six) hours as needed for nausea or vomiting. 02/17/23   Malachy Mood, MD  rosuvastatin (CRESTOR) 20 MG tablet Take 20 mg by mouth at bedtime. 08/18/21   [provider]  spironolactone (ALDACTONE) 25 MG tablet TAKE 1 TABLET(25 MG) BY MOUTH EVERY MORNING 01/03/23   Gollan, Tollie Pizza, MD  sucralfate (CARAFATE) 1 G tablet Take 1 tablet (1 g total) by mouth 4 (four) times daily -  with meals and at bedtime. Patient taking differently: Take 1 g by mouth 2 (two) times daily. 06/23/15   Shaune Pollack, MD  tiotropium (SPIRIVA) 18 MCG inhalation capsule Place 18 mcg into inhaler and inhale daily.    [provider]  tiZANidine (ZANAFLEX) 4 MG tablet Take 8 mg by mouth 3 (three) times daily. 09/14/21   [provider]  zolpidem (AMBIEN) 10 MG tablet Take 10 mg by mouth at bedtime as needed for sleep. 12/01/22   [provider]     Family History  Problem Relation Age of Onset   Breast cancer Mother        early 89's   Lung cancer Father    Brain cancer Father    Bone cancer Father    Colon cancer Maternal Aunt    Cancer Maternal Uncle        liver?   Diabetes Maternal  Grandmother    Asthma Other     Social History   Socioeconomic History   Marital status: Widowed    Spouse name: Not on file   Number of children: Not on file   Years of education: Not on file   Highest education level: Not on file  Occupational History   Occupation: unemployed  Tobacco Use   Smoking status: Every Day    Current packs/day: 3.00    Average packs/day: 3.0 packs/day for 46.0 years (138.0 ttl pk-yrs)    Types: Cigarettes   Smokeless tobacco: Never  Tobacco comments:    0.5PPD 01/13/2023  Vaping Use   Vaping status: Never Used  Substance and Sexual Activity   Alcohol use: Yes    Comment: 6 liquor drinks/day   Drug use: No   Sexual activity: Yes    Birth control/protection: Post-menopausal  Other Topics Concern   Not on file  Social History Narrative   Not on file   Social Determinants of Health   Financial Resource Strain: High Risk (07/18/2017)   Overall Financial Resource Strain (CARDIA)    Difficulty of Paying Living Expenses: Very hard  Food Insecurity: Food Insecurity Present (02/17/2023)   Hunger Vital Sign    Worried About Running Out of Food in the Last Year: Sometimes true    Ran Out of Food in the Last Year: Sometimes true  Transportation Needs: No Transportation Needs (02/17/2023)   PRAPARE - Administrator, Civil Service (Medical): No    Lack of Transportation (Non-Medical): No  Physical Activity: Insufficiently Active (07/18/2017)   Exercise Vital Sign    Days of Exercise per Week: 5 days    Minutes of Exercise per Session: 20 min  Stress: Stress Concern Present (07/18/2017)   [employer] of Occupational Health - Occupational Stress Questionnaire    Feeling of Stress : Very much  Social Connections: Socially Isolated (07/18/2017)   Social Connection and Isolation Panel [NHANES]    Frequency of Communication with Friends and Family: Twice a week    Frequency of Social Gatherings with Friends and Family: Never    Attends  Religious Services: Never    Database administrator or Organizations: No    Attends Engineer, structural: Never    Marital Status: Divorced    Review of Systems: A 12 point ROS discussed and pertinent positives are indicated in the HPI above.  All other systems are negative.  Review of Systems  Constitutional:  Positive for unexpected weight change. Negative for appetite change and fatigue.  Respiratory:  Negative for cough and shortness of breath.   Cardiovascular:  Negative for chest pain.  Gastrointestinal:  Positive for anal bleeding. Negative for abdominal pain.  Neurological:  Negative for weakness.  Psychiatric/Behavioral:  Negative for behavioral problems and confusion.     Vital Signs: BP 139/70   Pulse 88   Temp 98.8 F (37.1 C) (Temporal)   Resp 20   Ht 5\' 8"  (1.727 m)   Wt 170 lb (77.1 kg)   LMP 03/13/2015 (Approximate) Comment: more than 2 years ago  SpO2 92%   BMI 25.85 kg/m     Physical Exam Vitals reviewed.  HENT:     Mouth/Throat:     Mouth: Mucous membranes are moist.  Cardiovascular:     Rate and Rhythm: Normal rate and regular rhythm.     Heart sounds: Normal heart sounds.  Pulmonary:     Effort: Pulmonary effort is normal.     Breath sounds: Normal breath sounds.  Abdominal:     Palpations: Abdomen is soft.     Tenderness: There is no abdominal tenderness.  Musculoskeletal:        General: Normal range of motion.  Skin:    General: Skin is warm.  Neurological:     Mental Status: She is alert and oriented to person, place, and time.  Psychiatric:        Behavior: Behavior normal.     Imaging: CT Super D Chest Wo Contrast  Result Date: 01/31/2023 CLINICAL DATA:  Lung nodule  suspicious for metastasis, rectal cancer * Tracking Code: BO * EXAM: CT CHEST WITHOUT CONTRAST TECHNIQUE: Multidetector CT imaging of the chest was performed using thin slice collimation for electromagnetic bronchoscopy planning purposes, without intravenous  contrast. RADIATION DOSE REDUCTION: This exam was performed according to the departmental dose-optimization program which includes automated exposure control, adjustment of the mA and/or kV according to patient size and/or use of iterative reconstruction technique. COMPARISON:  01/03/2023 FINDINGS: Cardiovascular: Aortic atherosclerosis. Normal heart size. Left coronary artery calcifications no pericardial effusion. Mediastinum/Nodes: No enlarged mediastinal, hilar, or axillary lymph nodes. Thyroid gland, trachea, and esophagus demonstrate no significant findings. Lungs/Pleura: Unchanged perihilar nodule of the left upper lobe measuring 2.0 x 2.0 cm (series 4, image 71). Scattered adjacent ground-glass. Background of very fine centrilobular pulmonary nodularity, most concentrated in the lung apices. Minimal paraseptal emphysema and diffuse bilateral bronchial wall thickening. No pleural effusion or pneumothorax. Upper Abdomen: No acute abnormality. Macroscopic fat containing bilateral adrenal adenomata, benign, requiring no specific further follow-up or characterization. Musculoskeletal: No chest wall abnormality. No acute osseous findings. IMPRESSION: 1. Unchanged perihilar nodule of the left upper lobe measuring 2.0 x 2.0 cm. This is primarily suspicious for an incidental, synchronous pulmonary adenocarcinoma, however a solitary pulmonary metastasis is certainly a differential consideration. 2. Minimal emphysema with background of very fine centrilobular pulmonary nodularity, most concentrated in the lung apices, consistent with smoking-related respiratory bronchiolitis. 3. Coronary artery disease. Aortic Atherosclerosis (ICD10-I70.0) and Emphysema (ICD10-J43.9). Electronically Signed   By: Jearld Lesch M.D.   On: 01/31/2023 16:30   DG Chest Port 1 View  Result Date: 01/27/2023 CLINICAL DATA:  Status post bronchoscopy. EXAM: PORTABLE CHEST 1 VIEW COMPARISON:  CT chest 01/26/2023 and 01/03/2023 FINDINGS: Heart  size normal. Atherosclerotic calcifications are present at the aortic arch. Left hilar fullness is consistent with the known nodule. No other focal opacity or airspace disease present. No edema or effusion is present. The visualized soft tissues and bony thorax are unremarkable. IMPRESSION: 1. Left hilar fullness consistent with the known nodule. 2. No acute cardiopulmonary disease. Electronically Signed   By: Marin Roberts M.D.   On: 01/27/2023 09:55   DG C-ARM BRONCHOSCOPY  Result Date: 01/27/2023 C-ARM BRONCHOSCOPY: Fluoroscopy was utilized by the requesting physician.  No radiographic interpretation.    Labs:  CBC: Recent Labs    12/09/22 1515 12/23/22 1607 01/27/23 0657 02/17/23 1009  WBC 9.4 12.6* 8.4 8.9  HGB 13.9 13.8 14.7 13.8  HCT 41.4 42.1 45.9 41.1  PLT 288 284 236 277    COAGS: No results for input(s): "INR", "APTT" in the last 8760 hours.  BMP: Recent Labs    04/22/22 1752 12/23/22 1607 01/27/23 0657 02/17/23 1009  NA 138 139 139 134*  K 3.9 3.8 3.4* 3.9  CL 101 107 104 98  CO2 26 23 21* 26  GLUCOSE 141* 83 109* 236*  BUN 10 9 7 11   CALCIUM 9.2 8.5* 9.2 9.3  CREATININE 0.67 0.60 0.67 0.70  GFRNONAA >60 >60 >60 >60    LIVER FUNCTION TESTS: Recent Labs    04/22/22 1752 12/23/22 1607 02/17/23 1009  BILITOT 0.8 0.4 0.6  AST 31 17 14*  ALT 16 11 13   ALKPHOS 87 73 79  PROT 6.8 6.7 7.5  ALBUMIN 3.4* 3.3* 4.1    TUMOR MARKERS: Recent Labs    02/17/23 1009  CEA 18.82*    Assessment and Plan:  Scheduled for Port a cath placement in IR  Risks and benefits of image guided port-a-catheter  placement was discussed with the patient including, but not limited to bleeding, infection, pneumothorax, or fibrin sheath development and need for additional procedures.  All of the patient's questions were answered, patient is agreeable to proceed. Consent signed and in chart.  Thank you for this interesting consult.  I greatly enjoyed meeting Erica [name] and look forward to participating in their care.  A copy of this report was sent to the requesting provider on this date.  Electronically Signed: Robet Leu, PA-C 02/25/2023, 1:02 PM   I spent a total of  30 Minutes   in face to face in clinical consultation, greater than 50% of which was counseling/coordinating care for [facility] placement

## 2023-02-28 NOTE — Progress Notes (Unsigned)
Date:  03/01/2023   ID:  Erica [name], DOB 1966/06/17, MRN [id number]  Patient Location:  [address] [place] [address]   Provider location:   [facility], [place] office  PCP:  System, Provider Not In  Cardiologist:  Fonnie Mu  Chief Complaint  Patient presents with   Follow-up    No cardiac hx and recent dx of stage 4 colon CA with mets to lung    History of Present Illness:    Erica [name] is a 57 y.o. female with  past medical history of smoking, COPD, on inhalers, quit 04/2016 medication noncompliance secondary to financial and insurance issues,  chronic thrush,  chronic back pain  hospital  November 2016 with shortness of breath, pneumonia,  echocardiogram showing normal LV function, EF 60%,  elevated right heart pressures,   sodium around 112, abnormal troponin, long hospital course CT 06/2015, No significant CAD, PAD, minimal in aortic arch She presents today for follow-up of her chronic diastolic CHF  Seen by myself in clinic 4/23 Diagnosed with metastatic rectal cancer ,  lung cancer metastases  begin systemic chemotherapy with FOLFIRI on 03/03/2023.  Scheduled for radiation  Still smoking, started chantix On oxygen at home.    Has appreciated erythema right lower extremity consistent with cellulitis, she has had this before  Denies breakthrough tachypalpitations concerning for arrhythmia Symptoms well-controlled on diltiazem ER 120 daily  EKG personally reviewed by myself on todays visit EKG Interpretation Date/Time:  Tuesday March 01 2023 10:31:11 EDT Ventricular Rate:  84 PR Interval:  156 QRS Duration:  84 QT Interval:  362 QTC Calculation: 427 R Axis:   50  Text Interpretation: Normal sinus rhythm Cannot rule out Anterior infarct , age undetermined When compared with ECG of 02-Feb-2022 19:19, T wave inversion no longer evident in Inferior leads Nonspecific T wave abnormality no longer evident in Lateral leads  Confirmed by Julien Nordmann [phone]) on 03/01/2023 10:47:35 AM   Past medical history reviewed hospital February 2020 Alcoholic ketoacidosis Acute alcoholic gastritis  Had chest pain Acute hypokalemia, potassium 2.2, up to 3.8 Phos 1.1, mag 1.3 Atypical chest pain: Her EKG is normal and echocardiogram was unremarkable.  Other past medical history reviewed weight gain, worsening shortness of breath Seen in the ER 06/14/16 with shortness of breath symptoms, given IV Lasix   Previously had several Prednisone tapers, most recently in jan 2018,  OSA, does not use CPAP. Had problems with anxiety   Continues to take Advair, spiriva and proair Problems with thrush, takes Diflucan 150 mg 1 as needed    CT scan chest  Oct 2017   Previous CT 06/2015 No significant CAD, PAD, minimal in aortic arch  Echo 09/11/2018  1. The left ventricle has normal systolic function, with an ejection fraction of 55-60%. The cavity size was normal. There is mild concentric left ventricular hypertrophy. Left ventricular diastolic Doppler parameters are consistent with impaired  relaxation.  2. The right ventricle has normal systolic function. The cavity was normal. There is no increase in right ventricular wall thickness.  3. The mitral valve is normal in structure.  4. The tricuspid valve is normal in structure.  5. The aortic valve is tricuspid.  6. The pulmonic valve was normal in structure.   Past Medical History:  Diagnosis Date   (HFpEF) heart failure with preserved ejection fraction (HCC)    a. 05/2015 Echo: EF 60-65%, no rwma, PASP .   Acute pancreatitis 08/13/2018  Anxiety    Arthritis    Asthma    Bell's palsy    no deficit   Bronchitis    Cancer (HCC) 12/2022   rectal cancer   CHF (congestive heart failure) (HCC)    COPD (chronic obstructive pulmonary disease) (HCC)    Depression    Diabetes mellitus without complication (HCC)    type II 03/2017   Dyspnea    Fatty liver     per patient   Gall stones    per patient   Gastric ulcer    Hyperlipidemia    Hypertension    Lung nodule 12/2022   upper left   OSA (obstructive sleep apnea)    a. did not tolerate CPAP. On oxygen 2L via [place]   Pancreatitis    07/2018   Shoulder injury    6/19   Smoker    Past Surgical History:  Procedure Laterality Date   BRONCHIAL NEEDLE ASPIRATION BIOPSY  01/27/2023   Procedure: BRONCHIAL NEEDLE ASPIRATION BIOPSIES;  Surgeon: Raechel Chute, MD;  Location: [facility] ENDOSCOPY;  Service: Pulmonary;;   COLONOSCOPY WITH PROPOFOL N/A 12/15/2022   Procedure: COLONOSCOPY WITH PROPOFOL;  Surgeon: Wyline Mood, MD;  Location: [facility] ENDOSCOPY;  Service: Gastroenterology;  Laterality: N/A;   COLONOSCOPY WITH PROPOFOL N/A 12/16/2022   Procedure: COLONOSCOPY WITH PROPOFOL;  Surgeon: Wyline Mood, MD;  Location: [facility] ENDOSCOPY;  Service: Gastroenterology;  Laterality: N/A;   FLEXIBLE BRONCHOSCOPY N/A 06/20/2015   Procedure: FLEXIBLE BRONCHOSCOPY;  Surgeon: Shane Crutch, MD;  Location: [facility] ORS;  Service: Pulmonary;  Laterality: N/A;   IR IMAGING GUIDED PORT INSERTION  02/25/2023   KNEE SURGERY Left    8 knee surgeries     Current Meds  Medication Sig   albuterol (VENTOLIN HFA) 108 (90 Base) MCG/ACT inhaler Inhale 2 puffs into the lungs every 6 (six) hours as needed for wheezing or shortness of breath.    Blood Glucose Monitoring Suppl (ACCU-CHEK AVIVA PLUS) w/Device KIT See admin instructions.   busPIRone (BUSPAR) 30 MG tablet Take 30 mg by mouth 2 (two) times daily. PER PT STATES HER DOSE WAS INCREASED   clonazePAM (KLONOPIN) 0.5 MG tablet Take 0.5 mg by mouth at bedtime as needed for anxiety.   diltiazem (CARDIZEM CD) 120 MG 24 hr capsule TAKE 1 CAPSULE BY MOUTH EVERY DAY   diltiazem (CARDIZEM) 30 MG tablet TAKE 1 TABLET(30 MG) BY MOUTH THREE TIMES DAILY AS NEEDED   DULoxetine (CYMBALTA) 60 MG capsule Take 60 mg by mouth 2 (two) times daily.    ezetimibe (ZETIA) 10 MG tablet Take 10 mg by mouth  at bedtime.   Fluticasone-Salmeterol (ADVAIR) 250-50 MCG/DOSE AEPB Inhale 1 puff into the lungs 2 (two) times daily.   furosemide (LASIX) 40 MG tablet TAKE 1 TABLET BY MOUTH EVERY DAY. MAY TAKE AN ADDITIONAL TABLET IN THE AM FOR WEIGHT GAIN AS NEEDED   JARDIANCE 25 MG TABS tablet Take 25 mg by mouth daily.   lidocaine-prilocaine (EMLA) cream Apply to affected area once   metFORMIN (GLUCOPHAGE-XR) 500 MG 24 hr tablet Take 1,000 mg by mouth 2 (two) times daily.   ondansetron (ZOFRAN) 8 MG tablet Take 1 tablet (8 mg total) by mouth every 8 (eight) hours as needed for nausea, vomiting or refractory nausea / vomiting. Start on the third day after chemotherapy.   Oxycodone HCl 10 MG TABS Take 10 mg by mouth 3 (three) times daily as needed.   OXYGEN Inhale 2 L into the lungs at bedtime. Or as needed  throughout the day if short of breath   pantoprazole (PROTONIX) 40 MG tablet Take 1 tablet (40 mg total) by mouth daily.   potassium chloride SA (KLOR-CON) 20 MEQ tablet Take 20 mEq by mouth 2 (two) times daily.   pregabalin (LYRICA) 150 MG capsule Take 150 mg by mouth 3 (three) times daily.   prochlorperazine (COMPAZINE) 10 MG tablet Take 1 tablet (10 mg total) by mouth every 6 (six) hours as needed for nausea or vomiting.   spironolactone (ALDACTONE) 25 MG tablet TAKE 1 TABLET(25 MG) BY MOUTH EVERY MORNING   sucralfate (CARAFATE) 1 G tablet Take 1 tablet (1 g total) by mouth 4 (four) times daily -  with meals and at bedtime. (Patient taking differently: Take 1 g by mouth 2 (two) times daily.)   tiotropium (SPIRIVA) 18 MCG inhalation capsule Place 18 mcg into inhaler and inhale daily.   tiZANidine (ZANAFLEX) 4 MG tablet Take 8 mg by mouth 3 (three) times daily.   Varenicline Tartrate, Starter, 0.5 MG X 11 & 1 MG X 42 TBPK See admin instructions. follow package directions   zolpidem (AMBIEN) 10 MG tablet Take 10 mg by mouth at bedtime as needed for sleep.     Allergies:   Tramadol and Penicillins   Social  History   Tobacco Use   Smoking status: Every Day    Current packs/day: 3.00    Average packs/day: 3.0 packs/day for 46.0 years (138.0 ttl pk-yrs)    Types: Cigarettes   Smokeless tobacco: Never   Tobacco comments:    0.5PPD 01/13/2023    Started Chantix last week  Vaping Use   Vaping status: Never Used  Substance Use Topics   Alcohol use: Yes    Comment: 6 liquor drinks/day   Drug use: No     Family Hx: The patient's family history includes Asthma in an other family member; Bone cancer in her father; Brain cancer in her father; Breast cancer in her mother; Cancer in her maternal uncle; Colon cancer in her maternal aunt; Diabetes in her maternal grandmother; Lung cancer in her father.  ROS:   Please see the history of present illness.    Review of Systems  Constitutional: Negative.   Respiratory: Negative.    Cardiovascular: Negative.   Gastrointestinal: Negative.   Musculoskeletal: Negative.   Neurological: Negative.   Psychiatric/Behavioral: Negative.    All other systems reviewed and are negative.     Labs/Other Tests and Data Reviewed:    Recent Labs: 02/17/2023: ALT 13; BUN 11; Creatinine 0.70; Hemoglobin 13.8; Platelet Count 277; Potassium 3.9; Sodium 134   Recent Lipid Panel Lab Results  Component Value Date/Time   CHOL 145 09/11/2018 05:27 AM   TRIG 163 (H) 09/11/2018 05:27 AM   HDL 42 09/11/2018 05:27 AM   CHOLHDL 3.5 09/11/2018 05:27 AM   LDLCALC 70 09/11/2018 05:27 AM    Wt Readings from Last 3 Encounters:  03/01/23 178 lb (80.7 kg)  02/25/23 170 lb (77.1 kg)  02/24/23 170 lb (77.1 kg)     Exam:    BP 120/60 (BP Location: Left Arm, Patient Position: Sitting, Cuff Size: Normal)   Pulse 84   Ht 5\' 8"  (1.727 m)   Wt 178 lb (80.7 kg)   LMP 03/13/2015 (Approximate) Comment: more than 2 years ago  SpO2 95%   BMI 27.06 kg/m  Constitutional:  oriented to person, place, and time. No distress.  HENT:  Head: Grossly normal Eyes:  no discharge. No  scleral icterus.  Neck: No JVD, no carotid bruits  Cardiovascular: Regular rate and rhythm, no murmurs appreciated Pulmonary/Chest: Clear to auscultation bilaterally, no wheezes or rails Abdominal: Soft.  no distension.  no tenderness.  Musculoskeletal: Normal range of motion Neurological:  normal muscle tone. Coordination normal. No atrophy Skin: Skin warm and dry right lower extremity halfway up shin erythema, warm to touch Psychiatric: normal affect, pleasant   ASSESSMENT & PLAN:    Chronic diastolic heart failure (HCC) Euvolemic on today's visit, no changes to her medications, blood pressure control   Smoker Smoking cessation discussed Started on Chantix    Palpitations Rare breakthrough palpitations On diltiazem ER 120 daily Diltiazem 30 mg as needed   Pulmonary HTN Euvolemic High pressures noted on echo 2016 Improved on echo 2020 On lasix, no change to her regimen   Shortness of breath severe COPD, obesity, deconditioning, diastolic CHF/pulmonary hypertension On oxygen around the house stable   COPD (chronic obstructive pulmonary disease) with acute bronchitis (HCC) On steroid inhalers, albuterol On oxygen around the house   Obstructive sleep apnea Unable to tolerate CPAP, Panic attacks   Morbid obesity (HCC) We have encouraged continued exercise, careful diet management in an effort to lose weight.  Essential HTN: On dilatem 120 daily diltazem 30 as needed  Panic attacks: Management primary care  Cellulitis Right leg, erythemia noted, Will treat with chemo pending, prescription sent in for Keflex 500 mg 4 times daily 10 days  Rectal cancer with metastases Scheduled to start chemo and radiation this week    Total encounter time more than 30 minutes  Greater than 50% was spent in counseling and coordination of care with the patient    Signed, Julien Nordmann, MD  03/01/2023 10:43 AM    [facility] [address], [place], [place] [address]

## 2023-03-01 ENCOUNTER — Encounter: Payer: Self-pay | Admitting: Cardiovascular Disease

## 2023-03-01 ENCOUNTER — Ambulatory Visit: Payer: Medicaid Other | Attending: Cardiovascular Disease | Admitting: Cardiovascular Disease

## 2023-03-01 VITALS — BP 120/60 | HR 84 | Ht 68.0 in | Wt 178.0 lb

## 2023-03-01 DIAGNOSIS — I272 Pulmonary hypertension, unspecified: Secondary | ICD-10-CM | POA: Diagnosis not present

## 2023-03-01 DIAGNOSIS — I5032 Chronic diastolic (congestive) heart failure: Secondary | ICD-10-CM | POA: Diagnosis not present

## 2023-03-01 DIAGNOSIS — R000 Tachycardia, unspecified: Secondary | ICD-10-CM

## 2023-03-01 DIAGNOSIS — F17200 Nicotine dependence, unspecified, uncomplicated: Secondary | ICD-10-CM

## 2023-03-01 DIAGNOSIS — I10 Essential (primary) hypertension: Secondary | ICD-10-CM

## 2023-03-01 DIAGNOSIS — J209 Acute bronchitis, unspecified: Secondary | ICD-10-CM

## 2023-03-01 DIAGNOSIS — J440 Chronic obstructive pulmonary disease with acute lower respiratory infection: Secondary | ICD-10-CM

## 2023-03-01 MED ORDER — CEPHALEXIN 500 MG PO CAPS: 500.0000 mg | ORAL_CAPSULE | Freq: Four times a day (QID) | ORAL | 0 refills | Status: DC

## 2023-03-01 NOTE — Patient Instructions (Addendum)
Medication Instructions:  Keflex 500 mg 4 times a day for cellulitis, 10 days  If you need a refill on your cardiac medications before your next appointment, please call your pharmacy.   Lab work: No new labs needed  Testing/Procedures: No new testing needed  Follow-Up: At [facility], you and your health needs are our priority.  As part of our continuing mission to provide you with exceptional heart care, we have created designated Provider Care Teams.  These Care Teams include your primary Cardiologist (physician) and Advanced Practice Providers (APPs -  Physician Assistants and Nurse Practitioners) who all work together to provide you with the care you need, when you need it.  You will need a follow up appointment in 12 months  Providers on your designated Care Team:   Nicolasa Ducking, NP Eula Listen, PA-C Cadence Fransico [place], [place]  COVID-19 Vaccine Information can be found at: [email or web address] For questions related to vaccine distribution or appointments, please email [email or web address] or call [phone].

## 2023-03-02 ENCOUNTER — Other Ambulatory Visit: Payer: Self-pay

## 2023-03-02 MED FILL — Dexamethasone Sodium Phosphate Inj 100 MG/10ML: INTRAMUSCULAR | Qty: 1 | Status: AC

## 2023-03-02 NOTE — Assessment & Plan Note (Addendum)
She initially presented with hematochezia since 05/2022 -Workup showed a near obstructing rectal mass. Path confirmed adenocarcinoma. Baseline CEA elevated to 18.1.  - Local staging pelvic MRI 01/03/23 showed T3cN2 disease, and lung biopsy confirmed oligo lung metastasis -We reviewed that she has metastatic stage IV rectal cancer, but given the limited distant metastasis, may still be a candidate for cure using multimodal approach, if she has good tolerance and good response to treatment -We recommend initiating systemic chemo first, with FOLFIRI (rather than FOLFOX due to baseline neuropathy) every 2 weeks for 3-6 months 02/23/2023 - chemo education complete  03/03/2023 - she presents for Cycle 1 day 1 COLORECTAL FOLFIRI q 14 days  Continues to have intermittent blood in her stools. She has baseline peripheral neuropathy. This is most severe in bilateral feet.  -review labs  --CBC showing WBC 7.6; Hgb12.4; Hct 38.5; Plt 2.3; and anc 4.8  --CMP showing K 4.1; Ca 9.1; normal LFTs, and normal renal functions.

## 2023-03-02 NOTE — Progress Notes (Addendum)
Patient Care Team: System, Provider Not In as PCP - General Gollan, Tollie Pizza, MD as PCP - Cardiology (Cardiology) Delma Freeze, FNP as Nurse Practitioner (Family Medicine) Antonieta Iba, MD as Consulting Physician (Cardiology) Merwyn Katos, MD (Inactive) as Consulting Physician (Pulmonary Disease) Jim Like, RN as Registered Nurse Scarlett Presto, RN (Inactive) as Registered Nurse Benita Gutter, RN as Oncology Nurse Navigator Malachy Mood, MD as Consulting Physician (Oncology)  Clinic Day:  03/03/2023  Referring physician: Malachy Mood, MD  ASSESSMENT & PLAN:   Assessment & Plan: Rectal cancer [facility]) She initially presented with hematochezia since 05/2022 -Workup showed a near obstructing rectal mass. Path confirmed adenocarcinoma. Baseline CEA elevated to 18.1.  - Local staging pelvic MRI 01/03/23 showed T3cN2 disease, and lung biopsy confirmed oligo lung metastasis -We reviewed that she has metastatic stage IV rectal cancer, but given the limited distant metastasis, may still be a candidate for cure using multimodal approach, if she has good tolerance and good response to treatment -We recommend initiating systemic chemo first, with FOLFIRI (rather than FOLFOX due to baseline neuropathy) every 2 weeks for 3-6 months 02/23/2023 - chemo education complete  03/03/2023 - she presents for Cycle 1 day 1 COLORECTAL FOLFIRI q 14 days  Continues to have intermittent blood in her stools. She has baseline peripheral neuropathy. This is most severe in bilateral feet.  -review labs  --CBC showing WBC 7.6; Hgb12.4; Hct 38.5; Plt 2.3; and anc 4.8  --CMP showing K 4.1; Ca 9.1; normal LFTs, and normal renal functions.   Type 2 diabetes mellitus with hyperglycemia (HCC) Blood sugars have been elevated. She understands that she should be consistently taking diabetic medication. She also voices understanding that chemotherapy treatments may lead to some hyperglycemia. She states that she  will check her blood sugar frequently and notify provider if sugars do not return to baseline in reasonable time.   Neuropathy Patient does have baseline peripheral neuropathy, worse in feet. This will be monitored closely during treatment.    Plan: Review labs --CBC showing WBC 7.6; Hgb12.4; Hct 38.5; Plt 2.3; and anc 4.8  --CMP showing K 4.1; Ca 9.1; normal LFTs, and normal renal functions.  Chemo education completed 02/23/2023 Port placed 02/25/2023  Proceed with Cycle 1 day 1 COLORECTAL FOLFIRI Labs, follow up, infusion in 2 weeks.  The patient understands the plans discussed today and is in agreement with them.  She knows to contact our office if she develops concerns prior to her next appointment.  I provided 30 minutes of face-to-face time during this encounter and > 50% was spent counseling as documented under my assessment and plan.    Erica Jews, NP   CANCER CENTER [facility] CANCER CENTER AT [facility] [address] [place] [address] Dept: [phone] Dept Fax: [phone]   Orders Placed This Encounter  Procedures   CBC with Differential (Cancer Center Only)    Standing Status:   Future    Standing Expiration Date:   04/26/2024   CMP (Cancer Center only)    Standing Status:   Future    Standing Expiration Date:   04/26/2024      CHIEF COMPLAINT:  CC: rectal cancer   Current Treatment:  COLORECTAL FOLFIRI Q 14 days   INTERVAL HISTORY:  Erica [name] is here today for repeat clinical assessment. She denies fevers or chills. She denies pain. Her appetite is good. Her weight has been stable.  Rectal cancer Initial diagnosis 12/23/2022 Chemotherapy  education completed 02/23/2023 Consultation with radiation oncology 02/23/2023  Port placement 02/25/2023  Continues to have intermittent blood in her stools. She has baseline peripheral neuropathy. This is most severe in bilateral feet.  -review labs  --CBC showing WBC 7.6; Hgb12.4; Hct 38.5;  Plt 2.3; and anc 4.8  --CMP showing K 4.1; Ca 9.1; normal LFTs, and normal renal functions.  Proceed with Cycle 1 day 1 FOLFIRI   I have reviewed the past medical history, past surgical history, social history and family history with the patient and they are unchanged from previous note.  ALLERGIES:  is allergic to tramadol and penicillins.  MEDICATIONS:  Current Outpatient Medications  Medication Sig Dispense Refill   albuterol (VENTOLIN HFA) 108 (90 Base) MCG/ACT inhaler Inhale 2 puffs into the lungs every 6 (six) hours as needed for wheezing or shortness of breath.      Blood Glucose Monitoring Suppl (ACCU-CHEK AVIVA PLUS) w/Device KIT See admin instructions.  0   busPIRone (BUSPAR) 30 MG tablet Take 30 mg by mouth 2 (two) times daily. PER PT STATES HER DOSE WAS INCREASED     cephALEXin (KEFLEX) 500 MG capsule Take 1 capsule (500 mg total) by mouth 4 (four) times daily. 40 capsule 0   clonazePAM (KLONOPIN) 0.5 MG tablet Take 0.5 mg by mouth at bedtime as needed for anxiety.     diltiazem (CARDIZEM CD) 120 MG 24 hr capsule TAKE 1 CAPSULE BY MOUTH EVERY DAY 60 capsule 1   diltiazem (CARDIZEM) 30 MG tablet TAKE 1 TABLET(30 MG) BY MOUTH THREE TIMES DAILY AS NEEDED 270 tablet 0   DULoxetine (CYMBALTA) 60 MG capsule Take 60 mg by mouth 2 (two) times daily.      ezetimibe (ZETIA) 10 MG tablet Take 10 mg by mouth at bedtime.     Fluticasone-Salmeterol (ADVAIR) 250-50 MCG/DOSE AEPB Inhale 1 puff into the lungs 2 (two) times daily.     furosemide (LASIX) 40 MG tablet TAKE 1 TABLET BY MOUTH EVERY DAY. MAY TAKE AN ADDITIONAL TABLET IN THE AM FOR WEIGHT GAIN AS NEEDED 110 tablet 0   JARDIANCE 25 MG TABS tablet Take 25 mg by mouth daily.     lidocaine-prilocaine (EMLA) cream Apply to affected area once 30 g 3   metFORMIN (GLUCOPHAGE-XR) 500 MG 24 hr tablet Take 1,000 mg by mouth 2 (two) times daily.     ondansetron (ZOFRAN) 8 MG tablet Take 1 tablet (8 mg total) by mouth every 8 (eight) hours as  needed for nausea, vomiting or refractory nausea / vomiting. Start on the third day after chemotherapy. 30 tablet 1   Oxycodone HCl 10 MG TABS Take 10 mg by mouth 3 (three) times daily as needed.     OXYGEN Inhale 2 L into the lungs at bedtime. Or as needed throughout the day if short of breath     pantoprazole (PROTONIX) 40 MG tablet Take 1 tablet (40 mg total) by mouth daily. 30 tablet 0   potassium chloride SA (KLOR-CON) 20 MEQ tablet Take 20 mEq by mouth 2 (two) times daily.     pregabalin (LYRICA) 150 MG capsule Take 150 mg by mouth 3 (three) times daily.     prochlorperazine (COMPAZINE) 10 MG tablet Take 1 tablet (10 mg total) by mouth every 6 (six) hours as needed for nausea or vomiting. 30 tablet 1   spironolactone (ALDACTONE) 25 MG tablet TAKE 1 TABLET(25 MG) BY MOUTH EVERY MORNING 90 tablet 0   sucralfate (CARAFATE) 1 G tablet Take 1  tablet (1 g total) by mouth 4 (four) times daily -  with meals and at bedtime. (Patient taking differently: Take 1 g by mouth 2 (two) times daily.) 120 tablet 0   tiotropium (SPIRIVA) 18 MCG inhalation capsule Place 18 mcg into inhaler and inhale daily.     tiZANidine (ZANAFLEX) 4 MG tablet Take 8 mg by mouth 3 (three) times daily.     Varenicline Tartrate, Starter, 0.5 MG X 11 & 1 MG X 42 TBPK See admin instructions. follow package directions     zolpidem (AMBIEN) 10 MG tablet Take 10 mg by mouth at bedtime as needed for sleep.     No current facility-administered medications for this visit.   Facility-Administered Medications Ordered in Other Visits  Medication Dose Route Frequency Provider Last Rate Last Admin   fluorouracil (ADRUCIL) 5,000 mg in sodium chloride 0.9 % 150 mL chemo infusion  2,400 mg/m2 (Treatment Plan Recorded) Intravenous 1 day or 1 dose Malachy Mood, MD       fluorouracil (ADRUCIL) chemo injection 750 mg  400 mg/m2 (Treatment Plan Recorded) Intravenous Once Malachy Mood, MD       heparin lock flush 100 unit/mL  500 Units Intracatheter Once  PRN Malachy Mood, MD       irinotecan (CAMPTOSAR) 340 mg in sodium chloride 0.9 % 500 mL chemo infusion  180 mg/m2 (Treatment Plan Recorded) Intravenous Once Malachy Mood, MD       leucovorin 760 mg in sodium chloride 0.9 % 250 mL infusion  400 mg/m2 (Treatment Plan Recorded) Intravenous Once Malachy Mood, MD       sodium chloride flush (NS) 0.9 % injection 10 mL  10 mL Intracatheter PRN Malachy Mood, MD        HISTORY OF PRESENT ILLNESS:   Oncology History  Rectal cancer (HCC)  12/15/2022 Pathology Results   Colonoscopy:   Positive for invasive adenocarcinoma, G1 well differentiated      12/15/2022 Procedure   Colonoscopy under the care of Dr. Sharlet Salina   Findings: -The perianal and digital rectal examinations were normal. -Extensive amounts of liquid semi- liquid semi- solid stool was found in the entire colon -A partially obstructing large mass was found in the rectum. The mass was circumferential.   12/23/2022 Initial Diagnosis   Rectal cancer [facility]) Patient was seen by gastroenterology due to hematochezia as well as unintentional weight loss.  12/15/2022, colonoscopy showed fungating partially obstructing large mass in the rectum, circumferential, mass extended from about 15 cm from anus to about 8 cm from anal verge.  Biopsy was taken. Poor preparation of colon.  Colonoscopy was repeated on 12/16/2022, preparations inadequate.  Pathology showed invasive adenocarcinoma.   12/23/2022 Cancer Staging   Staging form: Colon and Rectum, AJCC 8th Edition - Clinical stage from 12/23/2022: Stage Unknown (cTX, cNX) - Signed by Rickard Patience, MD on 12/23/2022 Stage prefix: Initial diagnosis  Staging form: Colon and Rectum, AJCC 8th Edition - Clinical stage from 12/23/2022: Stage IVA (cT3, cN2, pM1a) - Signed by Rickard Patience, MD on 02/04/2023 Stage prefix: Initial diagnosis   12/23/2022 Tumor Marker   Patient's tumor was tested for the following markers: CEA. Results of the tumor marker test revealed elevated at 18.1.    01/03/2023 Imaging   CT chest abdomen with contrast showed 2.2 cm left upper lobe nodule, suspicious for metastasis. No findings suspicious for metastatic disease in the abdomen. Bilateral adrenal adenomas, benign. Cholelithiasis, without associated inflammatory changes. Aortic Atherosclerosis (ICD10-I70.0) and Emphysema   01/08/2023 Imaging  MRI pelvis without contrast-rectal protocol showed 9.2 cm concentric mid/lower rectal mass, corresponding to the patient's known primary rectal adenocarcinoma, as above. Rectal adenocarcinoma T stage: T3c Rectal adenocarcinoma N stage:  N2 Distance from tumor to the internal anal sphincter is 3.4 cm.   01/27/2023 Procedure   Patient underwent biopsy via bronchoscopy by pulmonology. Biopsy pathology showed adenocarcinoma, Immunohistochemistry is positive with cytokeratin 20 and CDX2 and negative with cytokeratin 7, TTF-1 and napsin A consistent with colonic adenocarcinoma    01/27/2023 Pathology Results   CASE: [id number]   FINAL MICROSCOPIC DIAGNOSIS:  A. LUNG, LUL, FINE NEEDLE ASPIRATION:  Adenocarcinoma  See comment   ADDENDUM:  Immunohistochemistry is positive with cytokeratin 20 and CDX2  and negative with cytokeratin 7, TTF-1 and napsin A consistent with  colonic adenocarcinoma.    03/03/2023 -  Chemotherapy   Patient is on Treatment Plan : COLORECTAL FOLFIRI q14d         REVIEW OF SYSTEMS:   Constitutional: Denies fevers, chills or abnormal weight loss. Moderate fatigue. She states this is baseline for her  Eyes: Denies blurriness of vision Ears, nose, mouth, throat, and face: Denies mucositis or sore throat Respiratory: Denies cough, dyspnea or wheezes Cardiovascular: Denies palpitation, chest discomfort or lower extremity swelling Gastrointestinal:  Denies nausea, heartburn or change in bowel habits. Intermittent blood in stools.  Skin: Denies abnormal skin rashes Lymphatics: Denies new lymphadenopathy or easy  bruising Neurological:Denies numbness, tingling or new weaknesses. Moderate to severe neuropathy in her feet. Mild to moderate neuropathy present in the fingers of both hands.  Behavioral/Psych: Mood is stable, no new changes  All other systems were reviewed with the patient and are negative.   VITALS:   Today's Vitals   03/03/23 1027 03/03/23 1038  BP: 125/66   Pulse: 82   Resp: 20   Temp: 97.8 F (36.6 C)   SpO2: 99%   Weight: 178 lb 3.2 oz (80.8 kg)   PainSc:  3    Body mass index is 27.1 kg/m.   Wt Readings from Last 3 Encounters:  03/03/23 178 lb 3.2 oz (80.8 kg)  03/01/23 178 lb (80.7 kg)  02/25/23 170 lb (77.1 kg)    Body mass index is 27.1 kg/m.  Performance status (ECOG): 1 - Symptomatic but completely ambulatory  PHYSICAL EXAM:   GENERAL:alert, no distress and comfortable SKIN: skin color, texture, turgor are normal, no rashes or significant lesions EYES: normal, Conjunctiva are pink and non-injected, sclera clear OROPHARYNX:no exudate, no erythema and lips, buccal mucosa, and tongue normal  NECK: supple, thyroid normal size, non-tender, without nodularity LYMPH:  no palpable lymphadenopathy in the cervical, axillary or inguinal LUNGS: clear to auscultation and percussion with normal breathing effort HEART: regular rate & rhythm and no murmurs and no lower extremity edema ABDOMEN:abdomen soft, non-tender and normal bowel sounds Musculoskeletal:no cyanosis of digits and no clubbing  NEURO: alert & oriented x 3 with fluent speech, no focal motor/sensory deficits  LABORATORY DATA:  I have reviewed the data as listed    Component Value Date/Time   NA 135 03/03/2023 0947   NA 140 04/22/2016 1532   NA 139 02/26/2014 1826   K 4.1 03/03/2023 0947   K 3.7 02/26/2014 1826   CL 97 (L) 03/03/2023 0947   CL 104 02/26/2014 1826   CO2 30 03/03/2023 0947   CO2 27 02/26/2014 1826   GLUCOSE 197 (H) 03/03/2023 0947   GLUCOSE 132 (H) 02/26/2014 1826   BUN 12  03/03/2023 0947  BUN 10 04/22/2016 1532   BUN 4 (L) 02/26/2014 1826   CREATININE 0.74 03/03/2023 0947   CREATININE 0.74 02/26/2014 1826   CALCIUM 9.1 03/03/2023 0947   CALCIUM 8.3 (L) 02/26/2014 1826   PROT 7.0 03/03/2023 0947   PROT 7.6 03/10/2013 0100   ALBUMIN 3.6 03/03/2023 0947   ALBUMIN 3.6 03/10/2013 0100   AST 14 (L) 03/03/2023 0947   ALT 12 03/03/2023 0947   ALT 57 03/10/2013 0100   ALKPHOS 85 03/03/2023 0947   ALKPHOS 89 03/10/2013 0100   BILITOT 0.4 03/03/2023 0947   GFRNONAA >60 03/03/2023 0947   GFRNONAA >60 02/26/2014 1826   GFRAA >60 09/12/2018 0422   GFRAA >60 02/26/2014 1826     Lab Results  Component Value Date   WBC 7.6 03/03/2023   NEUTROABS 4.8 03/03/2023   HGB 12.4 03/03/2023   HCT 38.5 03/03/2023   MCV 93.4 03/03/2023   PLT 203 03/03/2023       RADIOGRAPHIC STUDIES: I have personally reviewed the radiological images as listed and agreed with the findings in the report. IR IMAGING GUIDED PORT INSERTION  Result Date: 02/25/2023 INDICATION: 57 year old with rectal cancer. Port-A-Cath needed for chemotherapy. EXAM: FLUOROSCOPIC AND ULTRASOUND GUIDED PLACEMENT OF A SUBCUTANEOUS PORT COMPARISON:  None Available. MEDICATIONS: Moderate sedation ANESTHESIA/SEDATION: Moderate (conscious) sedation was employed during this procedure. A total of Versed 4 mg and fentanyl 200 mcg was administered intravenously at the order of the provider performing the procedure. Total intra-service moderate sedation time: 35 minutes. Patient's level of consciousness and vital signs were monitored continuously by radiology nurse throughout the procedure under the supervision of the provider performing the procedure. FLUOROSCOPY TIME:  Radiation Exposure Index (as provided by the fluoroscopic device): 1 mGy Kerma COMPLICATIONS: None immediate. PROCEDURE: The procedure, risks, benefits, and alternatives were explained to the patient. Questions regarding the procedure were encouraged  and answered. The patient understands and consents to the procedure. Patient was placed supine on the interventional table. Ultrasound confirmed a patent right internal jugular vein. Ultrasound image was saved for documentation. The right chest and neck were cleaned with a skin antiseptic and a sterile drape was placed. Maximal barrier sterile technique was utilized including caps, mask, sterile gowns, sterile gloves, sterile drape, hand hygiene and skin antiseptic. The right neck was anesthetized with 1% lidocaine. Small incision was made in the right neck with a blade. Micropuncture set was placed in the right internal jugular vein with ultrasound guidance. The micropuncture wire was used for measurement purposes. The right chest was anesthetized with 1% lidocaine. #15 blade was used to make an incision and a subcutaneous port pocket was formed. 8 french Power Port was assembled. Subcutaneous tunnel was formed with a stiff tunneling device. The port catheter was brought through the subcutaneous tunnel. The port was placed in the subcutaneous pocket. The micropuncture set was exchanged for a peel-away sheath. The catheter was placed through the peel-away sheath and the tip was positioned at the superior cavoatrial junction. Catheter placement was confirmed with fluoroscopy. The port was accessed and flushed with heparinized saline. The port pocket was closed using two layers of absorbable sutures and Dermabond. The vein skin site was closed using a single layer of absorbable suture and Dermabond. Sterile dressings were applied. Patient tolerated the procedure well without an immediate complication. Ultrasound and fluoroscopic images were taken and saved for this procedure. IMPRESSION: Placement of a subcutaneous power-injectable port device. Catheter tip at the superior cavoatrial junction. Electronically Signed   By: Madelaine Bhat  Lowella Dandy M.D.   On: 02/25/2023 16:51    Addendum I have seen the patient, examined her. I  agree with the assessment and and plan and have edited the notes.   Patient is here for his first cycle of chemotherapy FOLFIRI.  We reviewed the benefit and potential side effect in the management, especially diarrhea, and the risk of neutropenic fever.  She voiced good understanding, and agrees to proceed.  Will watch her blood glucose closely since she is diabetic.  All questions are answered.  I spent a total of 25 minutes for her visit today, more than 50% time on face-to-face counseling.  Malachy Mood MD 03/03/2023

## 2023-03-03 ENCOUNTER — Inpatient Hospital Stay (HOSPITAL_BASED_OUTPATIENT_CLINIC_OR_DEPARTMENT_OTHER): Payer: Medicaid Other | Admitting: Nurse Practitioner

## 2023-03-03 ENCOUNTER — Other Ambulatory Visit: Payer: Self-pay

## 2023-03-03 ENCOUNTER — Inpatient Hospital Stay: Payer: Medicaid Other

## 2023-03-03 VITALS — BP 125/66 | HR 82 | Temp 97.8°F | Resp 20 | Wt 178.2 lb

## 2023-03-03 VITALS — BP 117/67 | HR 75 | Resp 18

## 2023-03-03 DIAGNOSIS — G629 Polyneuropathy, unspecified: Secondary | ICD-10-CM

## 2023-03-03 DIAGNOSIS — C20 Malignant neoplasm of rectum: Secondary | ICD-10-CM

## 2023-03-03 DIAGNOSIS — E1165 Type 2 diabetes mellitus with hyperglycemia: Secondary | ICD-10-CM

## 2023-03-03 DIAGNOSIS — Z95828 Presence of other vascular implants and grafts: Secondary | ICD-10-CM

## 2023-03-03 DIAGNOSIS — Z5111 Encounter for antineoplastic chemotherapy: Secondary | ICD-10-CM | POA: Diagnosis not present

## 2023-03-03 LAB — CBC WITH DIFFERENTIAL (CANCER CENTER ONLY)
Abs Immature Granulocytes: 0.02 10*3/uL (ref 0.00–0.07)
Basophils Absolute: 0.1 10*3/uL (ref 0.0–0.1)
Basophils Relative: 1 %
Eosinophils Absolute: 0.3 10*3/uL (ref 0.0–0.5)
Eosinophils Relative: 4 %
HCT: 38.5 % (ref 36.0–46.0)
Hemoglobin: 12.4 g/dL (ref 12.0–15.0)
Immature Granulocytes: 0 %
Lymphocytes Relative: 23 %
Lymphs Abs: 1.7 10*3/uL (ref 0.7–4.0)
MCH: 30.1 pg (ref 26.0–34.0)
MCHC: 32.2 g/dL (ref 30.0–36.0)
MCV: 93.4 fL (ref 80.0–100.0)
Monocytes Absolute: 0.7 10*3/uL (ref 0.1–1.0)
Monocytes Relative: 9 %
Neutro Abs: 4.8 10*3/uL (ref 1.7–7.7)
Neutrophils Relative %: 63 %
Platelet Count: 203 10*3/uL (ref 150–400)
RBC: 4.12 MIL/uL (ref 3.87–5.11)
RDW: 16.5 % — ABNORMAL HIGH (ref 11.5–15.5)
WBC Count: 7.6 10*3/uL (ref 4.0–10.5)
nRBC: 0 % (ref 0.0–0.2)

## 2023-03-03 LAB — CMP (CANCER CENTER ONLY)
ALT: 12 U/L (ref 0–44)
AST: 14 U/L — ABNORMAL LOW (ref 15–41)
Albumin: 3.6 g/dL (ref 3.5–5.0)
Alkaline Phosphatase: 85 U/L (ref 38–126)
Anion gap: 8 (ref 5–15)
BUN: 12 mg/dL (ref 6–20)
CO2: 30 mmol/L (ref 22–32)
Calcium: 9.1 mg/dL (ref 8.9–10.3)
Chloride: 97 mmol/L — ABNORMAL LOW (ref 98–111)
Creatinine: 0.74 mg/dL (ref 0.44–1.00)
GFR, Estimated: >60 mL/min (ref >60)
Glucose, Bld: 197 mg/dL — ABNORMAL HIGH (ref 70–99)
Potassium: 4.1 mmol/L (ref 3.5–5.1)
Sodium: 135 mmol/L (ref 135–145)
Total Bilirubin: 0.4 mg/dL (ref 0.3–1.2)
Total Protein: 7 g/dL (ref 6.5–8.1)

## 2023-03-03 LAB — MISCELLANEOUS TEST

## 2023-03-03 MED ORDER — SODIUM CHLORIDE 0.9 % IV SOLN
10.0000 mg | Freq: Once | INTRAVENOUS | Status: AC
  Administered 2023-03-03: 10 mg via INTRAVENOUS
  Filled 2023-03-03: qty 10

## 2023-03-03 MED ORDER — SODIUM CHLORIDE 0.9 % IV SOLN: Freq: Once | INTRAVENOUS | Status: AC

## 2023-03-03 MED ORDER — SODIUM CHLORIDE 0.9 % IV SOLN
400.0000 mg/m2 | Freq: Once | INTRAVENOUS | Status: AC
  Administered 2023-03-03: 760 mg via INTRAVENOUS
  Filled 2023-03-03: qty 25

## 2023-03-03 MED ORDER — SODIUM CHLORIDE 0.9% FLUSH: 10.0000 mL | INTRAVENOUS | Status: DC | PRN

## 2023-03-03 MED ORDER — SODIUM CHLORIDE 0.9 % IV SOLN
2400.0000 mg/m2 | INTRAVENOUS | Status: DC
  Administered 2023-03-03: 5000 mg via INTRAVENOUS
  Filled 2023-03-03: qty 100

## 2023-03-03 MED ORDER — SODIUM CHLORIDE 0.9% FLUSH
10.0000 mL | Freq: Once | INTRAVENOUS | Status: AC
  Administered 2023-03-03: 10 mL

## 2023-03-03 MED ORDER — PALONOSETRON HCL INJECTION 0.25 MG/5ML
0.2500 mg | Freq: Once | INTRAVENOUS | Status: AC
  Administered 2023-03-03: 0.25 mg via INTRAVENOUS
  Filled 2023-03-03: qty 5

## 2023-03-03 MED ORDER — SODIUM CHLORIDE 0.9 % IV SOLN
180.0000 mg/m2 | Freq: Once | INTRAVENOUS | Status: AC
  Administered 2023-03-03: 340 mg via INTRAVENOUS
  Filled 2023-03-03: qty 2

## 2023-03-03 MED ORDER — FLUOROURACIL CHEMO INJECTION 2.5 GM/50ML
400.0000 mg/m2 | Freq: Once | INTRAVENOUS | Status: AC
  Administered 2023-03-03: 750 mg via INTRAVENOUS
  Filled 2023-03-03: qty 15

## 2023-03-03 MED ORDER — HEPARIN SOD (PORK) LOCK FLUSH 100 UNIT/ML IV SOLN: 500.0000 [IU] | Freq: Once | INTRAVENOUS | Status: DC | PRN

## 2023-03-03 NOTE — Patient Instructions (Addendum)
Erica [name]  Discharge Instructions: Thank you for choosing [facility] to provide your oncology and hematology care.   If you have a lab appointment with the [facility], please go directly to the [facility] and check in at the registration area.   Wear comfortable clothing and clothing appropriate for easy access to any Portacath or PICC line.   We strive to give you quality time with your provider. You may need to reschedule your appointment if you arrive late (15 or more minutes).  Arriving late affects you and other patients whose appointments are after yours.  Also, if you miss three or more appointments without notifying the office, you may be dismissed from the clinic at the provider's discretion.      For prescription refill requests, have your pharmacy contact our office and allow 72 hours for refills to be completed.    Today you received the following chemotherapy and/or immunotherapy agents: Irinotecan, Leucovorin, Fluorouracil.       To help prevent nausea and vomiting after your treatment, we encourage you to take your nausea medication as directed.  BELOW ARE SYMPTOMS THAT SHOULD BE REPORTED IMMEDIATELY: *FEVER GREATER THAN 100.4 F (38 C) OR HIGHER *CHILLS OR SWEATING *NAUSEA AND VOMITING THAT IS NOT CONTROLLED WITH YOUR NAUSEA MEDICATION *UNUSUAL SHORTNESS OF BREATH *UNUSUAL BRUISING OR BLEEDING *URINARY PROBLEMS (pain or burning when urinating, or frequent urination) *BOWEL PROBLEMS (unusual diarrhea, constipation, pain near the anus) TENDERNESS IN MOUTH AND THROAT WITH OR WITHOUT PRESENCE OF ULCERS (sore throat, sores in mouth, or a toothache) UNUSUAL RASH, SWELLING OR PAIN  UNUSUAL VAGINAL DISCHARGE OR ITCHING   Items with * indicate a potential emergency and should be followed up as soon as possible or go to the Emergency Department if any problems should occur.  Please show the CHEMOTHERAPY ALERT CARD or  IMMUNOTHERAPY ALERT CARD at check-in to the Emergency Department and triage nurse.  Should you have questions after your visit or need to cancel or reschedule your appointment, please contact [facility]  Dept: [phone]  and follow the prompts.  Office hours are 8:00 a.m. to 4:30 p.m. Monday - Friday. Please note that voicemails left after 4:00 p.m. may not be returned until the following business day.  We are closed weekends and major holidays. You have access to a nurse at all times for urgent questions. Please call the main number to the clinic Dept: [phone] and follow the prompts.   For any non-urgent questions, you may also contact your provider using MyChart. We now offer e-Visits for anyone 25 and older to request care online for non-urgent symptoms. For details visit [email or web address].   Also download the MyChart app! Go to the app store, search "MyChart", open the app, select [facility], and log in with your MyChart username and password.

## 2023-03-03 NOTE — Assessment & Plan Note (Signed)
Blood sugars have been elevated. She understands that she should be consistently taking diabetic medication. She also voices understanding that chemotherapy treatments may lead to some hyperglycemia. She states that she will check her blood sugar frequently and notify provider if sugars do not return to baseline in reasonable time.

## 2023-03-03 NOTE — Assessment & Plan Note (Signed)
Patient does have baseline peripheral neuropathy, worse in feet. This will be monitored closely during treatment.

## 2023-03-04 ENCOUNTER — Telehealth: Payer: Self-pay | Admitting: *Deleted

## 2023-03-04 ENCOUNTER — Encounter: Payer: Self-pay | Admitting: Hematology

## 2023-03-04 ENCOUNTER — Encounter: Payer: Self-pay | Admitting: Nurse Practitioner

## 2023-03-04 NOTE — Telephone Encounter (Signed)
-----   Message from Nurse Deirdre Priest sent at 03/03/2023  5:06 PM EDT ----- Regarding: First time chemo/Dr. Mosetta Putt Hello,  Pt had first treatment of Irinotecan, Leucovorin, and 5 FU today. She tolerated well. Please f/u.   Thank you,  Angelica Chessman

## 2023-03-04 NOTE — Progress Notes (Signed)
Received call from patient after receiving my card per my request.  Introduced myself as Dance movement psychotherapist and to offer available resources. Discussed one-time $1000 Marketing executive to assist with personal expenses while going through treatment. Advised what is needed to apply. She will provide at next visit on 03/17/23 to registration to complete process. She will then be given an expense sheet in the packet to review. Advised her to contact me at her earliest convenience to discuss that expense sheet. Briefly discussed gas cards and how they are disbursed.  She had questions regarding applying for Medicare. Referral sent to Darl Pikes in social work to follow up.  She has my card to contact me and for any additional financial questions or concerns.

## 2023-03-04 NOTE — Telephone Encounter (Signed)
Called pt to see how she did with her recent treatment.  Unable to leave message since vm full.  Called & left message with daughter & asked her to have her mother call back & speak with MD's nurse to f/u.

## 2023-03-05 ENCOUNTER — Inpatient Hospital Stay: Payer: Medicaid Other

## 2023-03-05 VITALS — BP 118/59 | HR 86 | Temp 97.6°F

## 2023-03-05 DIAGNOSIS — C20 Malignant neoplasm of rectum: Secondary | ICD-10-CM

## 2023-03-05 DIAGNOSIS — Z5111 Encounter for antineoplastic chemotherapy: Secondary | ICD-10-CM | POA: Diagnosis not present

## 2023-03-05 MED ORDER — HEPARIN SOD (PORK) LOCK FLUSH 100 UNIT/ML IV SOLN
500.0000 [IU] | Freq: Once | INTRAVENOUS | Status: AC | PRN
  Administered 2023-03-05: 500 [IU]

## 2023-03-05 MED ORDER — SODIUM CHLORIDE 0.9% FLUSH
10.0000 mL | INTRAVENOUS | Status: DC | PRN
  Administered 2023-03-05: 10 mL

## 2023-03-08 ENCOUNTER — Encounter (HOSPITAL_COMMUNITY): Payer: Self-pay | Admitting: Hematology

## 2023-03-10 ENCOUNTER — Other Ambulatory Visit: Payer: Self-pay | Admitting: Cardiovascular Disease

## 2023-03-11 ENCOUNTER — Inpatient Hospital Stay: Payer: Medicaid Other | Admitting: Licensed Clinical Social Worker

## 2023-03-11 DIAGNOSIS — C20 Malignant neoplasm of rectum: Secondary | ICD-10-CM

## 2023-03-11 NOTE — Progress Notes (Signed)
[facility] CSW Progress Note  Clinical Child psychotherapist  contacted pt to answer questions regarding Medicare eligibility.  Per pt she has been receiving SSD for 3 years and was told she is not eligible for Medicare.  CSW provided pt w/ contact information for [employer] to inquire as to why she would not be eligible as she should have been eligible after 2 years.  CSW discussed the pros and cons of receiving Medicare as pertains to [facility] eligibility.  CSW to remain available as appropriate throughout duration of treatment.        Rachel Moulds, LCSW Clinical Social Worker [facility]    Patient is participating in a Managed Medicaid Plan:  Yes

## 2023-03-15 ENCOUNTER — Other Ambulatory Visit: Payer: Self-pay

## 2023-03-16 MED FILL — Dexamethasone Sodium Phosphate Inj 100 MG/10ML: INTRAMUSCULAR | Qty: 1 | Status: AC

## 2023-03-17 ENCOUNTER — Inpatient Hospital Stay (HOSPITAL_BASED_OUTPATIENT_CLINIC_OR_DEPARTMENT_OTHER): Payer: Medicaid Other

## 2023-03-17 ENCOUNTER — Inpatient Hospital Stay: Payer: Medicaid Other

## 2023-03-17 ENCOUNTER — Encounter: Payer: Self-pay | Admitting: Hematology

## 2023-03-17 ENCOUNTER — Inpatient Hospital Stay: Payer: Medicaid Other | Admitting: Hematology

## 2023-03-17 VITALS — BP 114/59 | HR 89 | Temp 97.9°F | Resp 18 | Ht 68.0 in | Wt 179.6 lb

## 2023-03-17 DIAGNOSIS — C20 Malignant neoplasm of rectum: Secondary | ICD-10-CM

## 2023-03-17 DIAGNOSIS — Z95828 Presence of other vascular implants and grafts: Secondary | ICD-10-CM

## 2023-03-17 DIAGNOSIS — Z5111 Encounter for antineoplastic chemotherapy: Secondary | ICD-10-CM | POA: Diagnosis not present

## 2023-03-17 LAB — CMP (CANCER CENTER ONLY)
ALT: 13 U/L (ref 0–44)
AST: 12 U/L — ABNORMAL LOW (ref 15–41)
Albumin: 3.6 g/dL (ref 3.5–5.0)
Alkaline Phosphatase: 74 U/L (ref 38–126)
Anion gap: 7 (ref 5–15)
BUN: 13 mg/dL (ref 6–20)
CO2: 26 mmol/L (ref 22–32)
Calcium: 8.7 mg/dL — ABNORMAL LOW (ref 8.9–10.3)
Chloride: 104 mmol/L (ref 98–111)
Creatinine: 0.78 mg/dL (ref 0.44–1.00)
GFR, Estimated: >60 mL/min (ref >60)
Glucose, Bld: 212 mg/dL — ABNORMAL HIGH (ref 70–99)
Potassium: 4.4 mmol/L (ref 3.5–5.1)
Sodium: 137 mmol/L (ref 135–145)
Total Bilirubin: 0.4 mg/dL (ref 0.3–1.2)
Total Protein: 6.6 g/dL (ref 6.5–8.1)

## 2023-03-17 LAB — CBC WITH DIFFERENTIAL (CANCER CENTER ONLY)
Abs Immature Granulocytes: 0.01 10*3/uL (ref 0.00–0.07)
Basophils Absolute: 0.1 10*3/uL (ref 0.0–0.1)
Basophils Relative: 2 %
Eosinophils Absolute: 0.3 10*3/uL (ref 0.0–0.5)
Eosinophils Relative: 6 %
HCT: 35.6 % — ABNORMAL LOW (ref 36.0–46.0)
Hemoglobin: 11.6 g/dL — ABNORMAL LOW (ref 12.0–15.0)
Immature Granulocytes: 0 %
Lymphocytes Relative: 23 %
Lymphs Abs: 1 10*3/uL (ref 0.7–4.0)
MCH: 30.2 pg (ref 26.0–34.0)
MCHC: 32.6 g/dL (ref 30.0–36.0)
MCV: 92.7 fL (ref 80.0–100.0)
Monocytes Absolute: 0.6 10*3/uL (ref 0.1–1.0)
Monocytes Relative: 13 %
Neutro Abs: 2.4 10*3/uL (ref 1.7–7.7)
Neutrophils Relative %: 56 %
Platelet Count: 247 10*3/uL (ref 150–400)
RBC: 3.84 MIL/uL — ABNORMAL LOW (ref 3.87–5.11)
RDW: 16.7 % — ABNORMAL HIGH (ref 11.5–15.5)
WBC Count: 4.3 10*3/uL (ref 4.0–10.5)
nRBC: 0 % (ref 0.0–0.2)

## 2023-03-17 MED ORDER — HEPARIN SOD (PORK) LOCK FLUSH 100 UNIT/ML IV SOLN: 500.0000 [IU] | Freq: Once | INTRAVENOUS | Status: DC | PRN

## 2023-03-17 MED ORDER — SODIUM CHLORIDE 0.9% FLUSH
10.0000 mL | Freq: Once | INTRAVENOUS | Status: AC
  Administered 2023-03-17: 10 mL

## 2023-03-17 MED ORDER — SODIUM CHLORIDE 0.9% FLUSH: 10.0000 mL | INTRAVENOUS | Status: DC | PRN

## 2023-03-17 MED ORDER — PALONOSETRON HCL INJECTION 0.25 MG/5ML
0.2500 mg | Freq: Once | INTRAVENOUS | Status: AC
  Administered 2023-03-17: 0.25 mg via INTRAVENOUS
  Filled 2023-03-17: qty 5

## 2023-03-17 MED ORDER — FLUOROURACIL CHEMO INJECTION 2.5 GM/50ML
400.0000 mg/m2 | Freq: Once | INTRAVENOUS | Status: AC
  Administered 2023-03-17: 750 mg via INTRAVENOUS
  Filled 2023-03-17: qty 15

## 2023-03-17 MED ORDER — SODIUM CHLORIDE 0.9 % IV SOLN
10.0000 mg | Freq: Once | INTRAVENOUS | Status: AC
  Administered 2023-03-17: 10 mg via INTRAVENOUS
  Filled 2023-03-17: qty 10

## 2023-03-17 MED ORDER — SODIUM CHLORIDE 0.9 % IV SOLN: Freq: Once | INTRAVENOUS | Status: AC

## 2023-03-17 MED ORDER — SODIUM CHLORIDE 0.9 % IV SOLN
180.0000 mg/m2 | Freq: Once | INTRAVENOUS | Status: AC
  Administered 2023-03-17: 340 mg via INTRAVENOUS
  Filled 2023-03-17: qty 5

## 2023-03-17 MED ORDER — SODIUM CHLORIDE 0.9 % IV SOLN
2400.0000 mg/m2 | INTRAVENOUS | Status: DC
  Administered 2023-03-17: 5000 mg via INTRAVENOUS
  Filled 2023-03-17: qty 100

## 2023-03-17 MED ORDER — SODIUM CHLORIDE 0.9 % IV SOLN
400.0000 mg/m2 | Freq: Once | INTRAVENOUS | Status: AC
  Administered 2023-03-17: 760 mg via INTRAVENOUS
  Filled 2023-03-17: qty 38

## 2023-03-17 NOTE — Progress Notes (Signed)
[facility]   [phone] [phone]) [phone]   Clinic Follow up Note   Patient Care Team: System, Provider Not In as PCP - General Gollan, Tollie Pizza, MD as PCP - Cardiology (Cardiology) Delma Freeze, FNP as Nurse Practitioner (Family Medicine) Mariah Milling, Tollie Pizza, MD as Consulting Physician (Cardiology) Merwyn Katos, MD (Inactive) as Consulting Physician (Pulmonary Disease) Jim Like, RN as Registered Nurse Scarlett Presto, RN (Inactive) as Registered Nurse Benita Gutter, RN as Oncology Nurse Navigator Malachy Mood, MD as Consulting Physician (Oncology)  Date of Service:  03/17/2023  CHIEF COMPLAINT: f/u of Rectal cancer  CURRENT THERAPY:  COLORECTAL FOLFIRI q14d   ASSESSMENT:  Erica [name] is a 57 y.o. female with   Rectal cancer (HCC) cT3cN2M1, stage IV, intact MMR, KRAS G12V (+) -We reviewed her medical record and workup in detail with the patient and her daughter.  She presented with hematochezia since 05/2022 -Workup showed a near obstructing rectal mass. Path confirmed adenocarcinoma. Baseline CEA elevated to 18.1.  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  I recommend adding bevacizumab to her chemo -I discussed the potential side effect and benefit from bevacizumab, including hypertension, proteinuria, small risk of hemorrhage and thrombosis, bowel perforation, etc., she voiced good understanding and agrees to proceed.  Plan to start next cycle.   PLAN: - proceed to treatment today - reviewed meds  - increase fluids since taking lasix - reviewed labs - f/u and labs in 2 weeks for C3 chemo, and will add bevacizumab to  next cycle.    SUMMARY OF ONCOLOGIC HISTORY: Oncology History Overview Note   Cancer Staging  Rectal cancer [facility]) Staging form: Colon and Rectum, AJCC 8th Edition - Clinical stage from 12/23/2022: Stage IVA (cT3, cN2, pM1a) - Signed by Rickard Patience, MD on 02/04/2023 Stage prefix: Initial diagnosis     Rectal cancer (HCC)  12/15/2022 Pathology Results   Colonoscopy:   Positive for invasive adenocarcinoma, G1 well differentiated      12/15/2022 Procedure   Colonoscopy under the care of Dr. Sharlet Salina   Findings: -The perianal and digital rectal examinations were normal. -Extensive amounts of liquid semi- liquid semi- solid stool was found in the entire colon -A partially obstructing large mass was found in the rectum. The mass was circumferential.   12/23/2022 Initial Diagnosis   Rectal cancer [facility]) Patient was seen by gastroenterology due to hematochezia as well as unintentional weight loss.  12/15/2022, colonoscopy showed fungating partially obstructing large mass in the rectum, circumferential, mass extended from about 15 cm from anus to about 8 cm from anal verge.  Biopsy was taken. Poor preparation of colon.  Colonoscopy was repeated on 12/16/2022, preparations inadequate.  Pathology showed invasive adenocarcinoma.   12/23/2022 Cancer Staging   Staging form: Colon and Rectum, AJCC 8th Edition - Clinical stage from 12/23/2022: Stage Unknown (cTX, cNX) - Signed by Rickard Patience, MD on 12/23/2022 Stage prefix: Initial diagnosis  Staging form: Colon and Rectum, AJCC 8th Edition - Clinical stage from 12/23/2022: Stage IVA (cT3, cN2, pM1a) - Signed by Rickard Patience, MD on 02/04/2023 Stage prefix: Initial diagnosis   12/23/2022 Tumor Marker   Patient's tumor was tested for the following markers: CEA. Results  of the tumor marker test revealed elevated at 18.1.   01/03/2023 Imaging   CT chest abdomen with contrast showed 2.2 cm left upper lobe nodule, suspicious for metastasis. No findings suspicious for  metastatic disease in the abdomen. Bilateral adrenal adenomas, benign. Cholelithiasis, without associated inflammatory changes. Aortic Atherosclerosis (ICD10-I70.0) and Emphysema   01/08/2023 Imaging   MRI pelvis without contrast-rectal protocol showed 9.2 cm concentric mid/lower rectal mass, corresponding to the patient's known primary rectal adenocarcinoma, as above. Rectal adenocarcinoma T stage: T3c Rectal adenocarcinoma N stage:  N2 Distance from tumor to the internal anal sphincter is 3.4 cm.   01/27/2023 Procedure   Patient underwent biopsy via bronchoscopy by pulmonology. Biopsy pathology showed adenocarcinoma, Immunohistochemistry is positive with cytokeratin 20 and CDX2 and negative with cytokeratin 7, TTF-1 and napsin A consistent with colonic adenocarcinoma    01/27/2023 Pathology Results   CASE: [id number]   FINAL MICROSCOPIC DIAGNOSIS:  A. LUNG, LUL, FINE NEEDLE ASPIRATION:  Adenocarcinoma  See comment   ADDENDUM:  Immunohistochemistry is positive with cytokeratin 20 and CDX2  and negative with cytokeratin 7, TTF-1 and napsin A consistent with  colonic adenocarcinoma.    02/28/2023 Miscellaneous      03/03/2023 -  Chemotherapy   Patient is on Treatment Plan : COLORECTAL FOLFIRI q14d        INTERVAL HISTORY:  Erica [name] is here for a follow up of rectal cancer. She was last seen by Vincent Gros NP. She presents to clinic today alone. Patient drove herself to appointment today. She was late. She is doing well after last treatment. No side effects energy is good. Bowel movements are normal.    All other systems were reviewed with the patient and are negative.  MEDICAL HISTORY:  Past Medical History:  Diagnosis Date   (HFpEF) heart failure with preserved ejection fraction (HCC)    a. 05/2015 Echo: EF 60-65%, no rwma, PASP .   Acute pancreatitis 08/13/2018   Anxiety    Arthritis    Asthma    Bell's palsy    no deficit   Bronchitis    Cancer  (HCC) 12/2022   rectal cancer   CHF (congestive heart failure) (HCC)    COPD (chronic obstructive pulmonary disease) (HCC)    Depression    Diabetes mellitus without complication (HCC)    type II 03/2017   Dyspnea    Fatty liver    per patient   Gall stones    per patient   Gastric ulcer    Hyperlipidemia    Hypertension    Lung nodule 12/2022   upper left   OSA (obstructive sleep apnea)    a. did not tolerate CPAP. On oxygen 2L via [place]   Pancreatitis    07/2018   Shoulder injury    6/19   Smoker     SURGICAL HISTORY: Past Surgical History:  Procedure Laterality Date   BRONCHIAL NEEDLE ASPIRATION BIOPSY  01/27/2023   Procedure: BRONCHIAL NEEDLE ASPIRATION BIOPSIES;  Surgeon: Raechel Chute, MD;  Location: [facility] ENDOSCOPY;  Service: Pulmonary;;   COLONOSCOPY WITH PROPOFOL N/A 12/15/2022   Procedure: COLONOSCOPY WITH PROPOFOL;  Surgeon: Wyline Mood, MD;  Location: [facility] ENDOSCOPY;  Service: Gastroenterology;  Laterality: N/A;   COLONOSCOPY WITH PROPOFOL N/A 12/16/2022   Procedure: COLONOSCOPY WITH PROPOFOL;  Surgeon: Wyline Mood, MD;  Location: [facility] ENDOSCOPY;  Service: Gastroenterology;  Laterality: N/A;   FLEXIBLE BRONCHOSCOPY N/A 06/20/2015   Procedure: FLEXIBLE BRONCHOSCOPY;  Surgeon: Shane Crutch, MD;  Location: [facility] ORS;  Service: Pulmonary;  Laterality: N/A;   IR IMAGING GUIDED PORT INSERTION  02/25/2023   KNEE SURGERY Left    8 knee surgeries    I have reviewed the social history and family history with the patient and they are unchanged from previous note.  ALLERGIES:  is allergic to tramadol and penicillins.  MEDICATIONS:  Current Outpatient Medications  Medication Sig Dispense Refill   albuterol (VENTOLIN HFA) 108 (90 Base) MCG/ACT inhaler Inhale 2 puffs into the lungs every 6 (six) hours as needed for wheezing or shortness of breath.      Blood Glucose Monitoring Suppl (ACCU-CHEK AVIVA PLUS) w/Device KIT See admin instructions.  0   busPIRone (BUSPAR) 30 MG  tablet Take 30 mg by mouth 2 (two) times daily. PER PT STATES HER DOSE WAS INCREASED     cephALEXin (KEFLEX) 500 MG capsule Take 1 capsule (500 mg total) by mouth 4 (four) times daily. 40 capsule 0   clonazePAM (KLONOPIN) 0.5 MG tablet Take 0.5 mg by mouth at bedtime as needed for anxiety.     diltiazem (CARDIZEM CD) 120 MG 24 hr capsule TAKE 1 CAPSULE BY MOUTH EVERY DAY 60 capsule 1   diltiazem (CARDIZEM) 30 MG tablet TAKE 1 TABLET(30 MG) BY MOUTH THREE TIMES DAILY AS NEEDED 270 tablet 0   DULoxetine (CYMBALTA) 60 MG capsule Take 60 mg by mouth 2 (two) times daily.      ezetimibe (ZETIA) 10 MG tablet Take 10 mg by mouth at bedtime.     Fluticasone-Salmeterol (ADVAIR) 250-50 MCG/DOSE AEPB Inhale 1 puff into the lungs 2 (two) times daily.     furosemide (LASIX) 40 MG tablet TAKE 1 TABLET BY MOUTH EVERY DAY. MAY TAKE ADDITIONAL TABLET IN THE AM FOR WEIGHT GAIN AS NEEDED 110 tablet 2   JARDIANCE 25 MG TABS tablet Take 25 mg by mouth daily.     lidocaine-prilocaine (EMLA) cream Apply to affected area once 30 g 3   metFORMIN (GLUCOPHAGE-XR) 500 MG 24 hr tablet Take 1,000 mg by mouth 2 (two) times daily.     ondansetron (ZOFRAN) 8 MG tablet Take 1 tablet (8 mg total) by mouth every 8 (eight) hours as needed for nausea, vomiting or refractory nausea / vomiting. Start on the third day after chemotherapy. 30 tablet 1   Oxycodone HCl 10 MG TABS Take 10 mg by mouth 3 (three) times daily as needed.     OXYGEN Inhale 2 L into the lungs at bedtime. Or as needed throughout the day if short of breath     pantoprazole (PROTONIX) 40 MG tablet Take 1 tablet (40 mg total) by mouth daily. 30 tablet 0   potassium chloride SA (KLOR-CON) 20 MEQ tablet Take 20 mEq by mouth 2 (two) times daily.     pregabalin (LYRICA) 150 MG capsule Take 150 mg by mouth 3 (three) times daily.     prochlorperazine (COMPAZINE) 10 MG tablet Take 1 tablet (10 mg total) by mouth every 6 (six) hours as needed for nausea or vomiting. 30 tablet 1    spironolactone (ALDACTONE) 25 MG tablet TAKE 1 TABLET(25 MG) BY MOUTH EVERY MORNING 90 tablet 0   sucralfate (CARAFATE) 1 G tablet Take 1 tablet (1 g total) by mouth 4 (four) times daily -  with meals and at bedtime. (Patient taking differently: Take 1 g by mouth 2 (two) times daily.) 120 tablet 0   tiotropium (SPIRIVA) 18 MCG inhalation capsule Place 18 mcg into inhaler and inhale daily.  tiZANidine (ZANAFLEX) 4 MG tablet Take 8 mg by mouth 3 (three) times daily.     Varenicline Tartrate, Starter, 0.5 MG X 11 & 1 MG X 42 TBPK See admin instructions. follow package directions     zolpidem (AMBIEN) 10 MG tablet Take 10 mg by mouth at bedtime as needed for sleep.     No current facility-administered medications for this visit.    PHYSICAL EXAMINATION: ECOG PERFORMANCE STATUS: 1 - Symptomatic but completely ambulatory  Vitals:   03/17/23 1120  BP: (!) 114/59  Pulse: 89  Resp: 18  Temp: 97.9 F (36.6 C)  SpO2: 96%   Wt Readings from Last 3 Encounters:  03/17/23 179 lb 9.6 oz (81.5 kg)  03/03/23 178 lb 3.2 oz (80.8 kg)  03/01/23 178 lb (80.7 kg)     GENERAL:alert, no distress and comfortable SKIN: skin color, texture, turgor are normal, no rashes or significant lesions EYES: normal, Conjunctiva are pink and non-injected, sclera clear NECK: supple, thyroid normal size, non-tender, without nodularity LYMPH:  no palpable lymphadenopathy in the cervical, axillary  LUNGS: clear to auscultation and percussion with normal breathing effort HEART: regular rate & rhythm and no murmurs and no lower extremity edema ABDOMEN:abdomen soft, non-tender and normal bowel sounds Musculoskeletal:no cyanosis of digits and no clubbing  NEURO: alert & oriented x 3 with fluent speech, no focal motor/sensory deficits  LABORATORY DATA:  I have reviewed the data as listed    Latest Ref Rng & Units 03/17/2023   10:58 AM 03/03/2023    9:47 AM 02/17/2023   10:09 AM  CBC  WBC 4.0 - 10.5 K/uL 4.3  7.6   8.9   Hemoglobin 12.0 - 15.0 g/dL 16.1  09.6  04.5   Hematocrit 36.0 - 46.0 % 35.6  38.5  41.1   Platelets 150 - 400 K/uL 247  203  277         Latest Ref Rng & Units 03/03/2023    9:47 AM 02/17/2023   10:09 AM 01/27/2023    6:57 AM  CMP  Glucose 70 - 99 mg/dL 409  811  914   BUN 6 - 20 mg/dL 12  11  7    Creatinine 0.44 - 1.00 mg/dL 7.82  9.56  2.13   Sodium 135 - 145 mmol/L 135  134  139   Potassium 3.5 - 5.1 mmol/L 4.1  3.9  3.4   Chloride 98 - 111 mmol/L 97  98  104   CO2 22 - 32 mmol/L 30  26  21    Calcium 8.9 - 10.3 mg/dL 9.1  9.3  9.2   Total Protein 6.5 - 8.1 g/dL 7.0  7.5    Total Bilirubin 0.3 - 1.2 mg/dL 0.4  0.6    Alkaline Phos 38 - 126 U/L 85  79    AST 15 - 41 U/L 14  14    ALT 0 - 44 U/L 12  13        RADIOGRAPHIC STUDIES: I have personally reviewed the radiological images as listed and agreed with the findings in the report. No results found.    No orders of the defined types were placed in this encounter.  All questions were answered. The patient knows to call the clinic with any problems, questions or concerns. No barriers to learning was detected. The total time spent in the appointment was 30 minutes.     Malachy Mood, MD 03/17/2023   I, Sharlette Dense, CMA, am acting as scribe  for Malachy Mood, MD.   I have reviewed the above documentation for accuracy and completeness, and I agree with the above.

## 2023-03-17 NOTE — Patient Instructions (Addendum)
[facility]  Discharge Instructions: Thank you for choosing [facility] to provide your oncology and hematology care.   If you have a lab appointment with the Cancer Center, please go directly to the Cancer Center and check in at the registration area.   Wear comfortable clothing and clothing appropriate for easy access to any Portacath or PICC line.   We strive to give you quality time with your provider. You may need to reschedule your appointment if you arrive late (15 or more minutes).  Arriving late affects you and other patients whose appointments are after yours.  Also, if you miss three or more appointments without notifying the office, you may be dismissed from the clinic at the provider's discretion.      For prescription refill requests, have your pharmacy contact our office and allow 72 hours for refills to be completed.    Today you received the following chemotherapy and/or immunotherapy agents: irinotecan & fluorourcil   To help prevent nausea and vomiting after your treatment, we encourage you to take your nausea medication as directed.  BELOW ARE SYMPTOMS THAT SHOULD BE REPORTED IMMEDIATELY: *FEVER GREATER THAN 100.4 F (38 C) OR HIGHER *CHILLS OR SWEATING *NAUSEA AND VOMITING THAT IS NOT CONTROLLED WITH YOUR NAUSEA MEDICATION *UNUSUAL SHORTNESS OF BREATH *UNUSUAL BRUISING OR BLEEDING *URINARY PROBLEMS (pain or burning when urinating, or frequent urination) *BOWEL PROBLEMS (unusual diarrhea, constipation, pain near the anus) TENDERNESS IN MOUTH AND THROAT WITH OR WITHOUT PRESENCE OF ULCERS (sore throat, sores in mouth, or a toothache) UNUSUAL RASH, SWELLING OR PAIN  UNUSUAL VAGINAL DISCHARGE OR ITCHING   Items with * indicate a potential emergency and should be followed up as soon as possible or go to the Emergency Department if any problems should occur.  Please show the CHEMOTHERAPY ALERT CARD or IMMUNOTHERAPY ALERT  CARD at check-in to the Emergency Department and triage nurse.  Should you have questions after your visit or need to cancel or reschedule your appointment, please contact [facility]  Dept: [phone]  and follow the prompts.  Office hours are 8:00 a.m. to 4:30 p.m. Monday - Friday. Please note that voicemails left after 4:00 p.m. may not be returned until the following business day.  We are closed weekends and major holidays. You have access to a nurse at all times for urgent questions. Please call the main number to the clinic Dept: [phone] and follow the prompts.   For any non-urgent questions, you may also contact your provider using MyChart. We now offer e-Visits for anyone 71 and older to request care online for non-urgent symptoms. For details visit [email or web address].   Also download the MyChart app! Go to the app store, search "MyChart", open the app, select [facility], and log in with your MyChart username and password.

## 2023-03-17 NOTE — Assessment & Plan Note (Signed)
cT3cN2M1, stage IV, intact MMR, KRAS G12V (+) -We reviewed her medical record and workup in detail with the patient and her daughter.  She presented with hematochezia since 05/2022 -Workup showed a near obstructing rectal mass. Path confirmed adenocarcinoma. Baseline CEA elevated to 18.1.  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  I recommend adding bevacizumab to her chemo -I discussed the potential side effect and benefit from bevacizumab, including hypertension, proteinuria, small risk of hemorrhage and thrombosis, bowel perforation, etc., she voiced good understanding and agrees to proceed.  Plan to start next cycle.

## 2023-03-19 ENCOUNTER — Inpatient Hospital Stay: Payer: Medicaid Other

## 2023-03-19 ENCOUNTER — Other Ambulatory Visit: Payer: Self-pay

## 2023-03-19 VITALS — BP 155/59 | HR 120 | Temp 98.2°F | Resp 18

## 2023-03-19 DIAGNOSIS — Z5111 Encounter for antineoplastic chemotherapy: Secondary | ICD-10-CM | POA: Diagnosis not present

## 2023-03-19 DIAGNOSIS — C20 Malignant neoplasm of rectum: Secondary | ICD-10-CM

## 2023-03-19 MED ORDER — HEPARIN SOD (PORK) LOCK FLUSH 100 UNIT/ML IV SOLN
500.0000 [IU] | Freq: Once | INTRAVENOUS | Status: AC | PRN
  Administered 2023-03-19: 500 [IU]

## 2023-03-19 MED ORDER — SODIUM CHLORIDE 0.9% FLUSH
10.0000 mL | INTRAVENOUS | Status: DC | PRN
  Administered 2023-03-19: 10 mL

## 2023-03-24 ENCOUNTER — Other Ambulatory Visit: Payer: Self-pay

## 2023-03-24 ENCOUNTER — Telehealth: Payer: Self-pay

## 2023-03-24 NOTE — Telephone Encounter (Signed)
Patient called stating that she needs her pain medication filled. She can't get in touch with the office in [place] to get her meds refilled. She has to drive 4 hours there to get them filled. Can you refill her Oxycodone 10mg  and send it to [facility] in [place].

## 2023-03-25 ENCOUNTER — Other Ambulatory Visit: Payer: Self-pay

## 2023-03-29 ENCOUNTER — Other Ambulatory Visit: Payer: Self-pay

## 2023-03-29 ENCOUNTER — Telehealth: Payer: Self-pay | Admitting: Nurse Practitioner

## 2023-03-29 DIAGNOSIS — C20 Malignant neoplasm of rectum: Secondary | ICD-10-CM

## 2023-03-29 NOTE — Telephone Encounter (Signed)
Erroneous

## 2023-03-30 MED FILL — Dexamethasone Sodium Phosphate Inj 100 MG/10ML: INTRAMUSCULAR | Qty: 1 | Status: AC

## 2023-03-31 ENCOUNTER — Other Ambulatory Visit: Payer: Self-pay

## 2023-03-31 ENCOUNTER — Inpatient Hospital Stay: Payer: Medicaid Other

## 2023-03-31 ENCOUNTER — Encounter: Payer: Self-pay | Admitting: Nurse Practitioner

## 2023-03-31 ENCOUNTER — Inpatient Hospital Stay: Payer: Medicaid Other | Attending: Oncology

## 2023-03-31 ENCOUNTER — Inpatient Hospital Stay (HOSPITAL_BASED_OUTPATIENT_CLINIC_OR_DEPARTMENT_OTHER): Payer: Medicaid Other | Admitting: Physician Assistant

## 2023-03-31 ENCOUNTER — Inpatient Hospital Stay (HOSPITAL_BASED_OUTPATIENT_CLINIC_OR_DEPARTMENT_OTHER): Payer: Medicaid Other | Admitting: Nurse Practitioner

## 2023-03-31 VITALS — BP 145/69 | HR 86 | Temp 97.2°F | Resp 20 | Wt 170.6 lb

## 2023-03-31 VITALS — BP 123/66 | HR 94 | Temp 98.4°F | Ht 68.0 in | Wt 172.0 lb

## 2023-03-31 DIAGNOSIS — K5903 Drug induced constipation: Secondary | ICD-10-CM

## 2023-03-31 DIAGNOSIS — Z515 Encounter for palliative care: Secondary | ICD-10-CM

## 2023-03-31 DIAGNOSIS — Z5111 Encounter for antineoplastic chemotherapy: Secondary | ICD-10-CM | POA: Diagnosis present

## 2023-03-31 DIAGNOSIS — Z95828 Presence of other vascular implants and grafts: Secondary | ICD-10-CM

## 2023-03-31 DIAGNOSIS — C7951 Secondary malignant neoplasm of bone: Secondary | ICD-10-CM | POA: Insufficient documentation

## 2023-03-31 DIAGNOSIS — C20 Malignant neoplasm of rectum: Secondary | ICD-10-CM

## 2023-03-31 DIAGNOSIS — M792 Neuralgia and neuritis, unspecified: Secondary | ICD-10-CM | POA: Diagnosis not present

## 2023-03-31 DIAGNOSIS — C7800 Secondary malignant neoplasm of unspecified lung: Secondary | ICD-10-CM | POA: Diagnosis not present

## 2023-03-31 DIAGNOSIS — Z7189 Other specified counseling: Secondary | ICD-10-CM

## 2023-03-31 DIAGNOSIS — Z5189 Encounter for other specified aftercare: Secondary | ICD-10-CM | POA: Diagnosis not present

## 2023-03-31 DIAGNOSIS — G893 Neoplasm related pain (acute) (chronic): Secondary | ICD-10-CM

## 2023-03-31 LAB — CMP (CANCER CENTER ONLY)
ALT: 20 U/L (ref 0–44)
AST: 13 U/L — ABNORMAL LOW (ref 15–41)
Albumin: 4.2 g/dL (ref 3.5–5.0)
Alkaline Phosphatase: 85 U/L (ref 38–126)
Anion gap: 9 (ref 5–15)
BUN: 8 mg/dL (ref 6–20)
CO2: 26 mmol/L (ref 22–32)
Calcium: 9.8 mg/dL (ref 8.9–10.3)
Chloride: 101 mmol/L (ref 98–111)
Creatinine: 0.77 mg/dL (ref 0.44–1.00)
GFR, Estimated: >60 mL/min (ref >60)
Glucose, Bld: 189 mg/dL — ABNORMAL HIGH (ref 70–99)
Potassium: 4.5 mmol/L (ref 3.5–5.1)
Sodium: 136 mmol/L (ref 135–145)
Total Bilirubin: 0.5 mg/dL (ref 0.3–1.2)
Total Protein: 7.4 g/dL (ref 6.5–8.1)

## 2023-03-31 LAB — CBC WITH DIFFERENTIAL (CANCER CENTER ONLY)
Abs Immature Granulocytes: 0.01 10*3/uL (ref 0.00–0.07)
Basophils Absolute: 0.1 10*3/uL (ref 0.0–0.1)
Basophils Relative: 2 %
Eosinophils Absolute: 0.2 10*3/uL (ref 0.0–0.5)
Eosinophils Relative: 5 %
HCT: 40.4 % (ref 36.0–46.0)
Hemoglobin: 13.1 g/dL (ref 12.0–15.0)
Immature Granulocytes: 0 %
Lymphocytes Relative: 50 %
Lymphs Abs: 1.8 10*3/uL (ref 0.7–4.0)
MCH: 30 pg (ref 26.0–34.0)
MCHC: 32.4 g/dL (ref 30.0–36.0)
MCV: 92.4 fL (ref 80.0–100.0)
Monocytes Absolute: 0.4 10*3/uL (ref 0.1–1.0)
Monocytes Relative: 11 %
Neutro Abs: 1.2 10*3/uL — ABNORMAL LOW (ref 1.7–7.7)
Neutrophils Relative %: 32 %
Platelet Count: 228 10*3/uL (ref 150–400)
RBC: 4.37 MIL/uL (ref 3.87–5.11)
RDW: 18.7 % — ABNORMAL HIGH (ref 11.5–15.5)
WBC Count: 3.8 10*3/uL — ABNORMAL LOW (ref 4.0–10.5)
nRBC: 0 % (ref 0.0–0.2)

## 2023-03-31 LAB — TOTAL PROTEIN, URINE DIPSTICK: Protein, ur: NEGATIVE mg/dL

## 2023-03-31 MED ORDER — SODIUM CHLORIDE 0.9 % IV SOLN
10.0000 mg | Freq: Once | INTRAVENOUS | Status: AC
  Administered 2023-03-31: 10 mg via INTRAVENOUS
  Filled 2023-03-31: qty 10

## 2023-03-31 MED ORDER — SODIUM CHLORIDE 0.9 % IV SOLN
2400.0000 mg/m2 | INTRAVENOUS | Status: DC
  Administered 2023-03-31: 5000 mg via INTRAVENOUS
  Filled 2023-03-31: qty 100

## 2023-03-31 MED ORDER — FLUOROURACIL CHEMO INJECTION 2.5 GM/50ML
400.0000 mg/m2 | Freq: Once | INTRAVENOUS | Status: AC
  Administered 2023-03-31: 750 mg via INTRAVENOUS
  Filled 2023-03-31: qty 15

## 2023-03-31 MED ORDER — OXYCODONE HCL 10 MG PO TABS
10.0000 mg | ORAL_TABLET | Freq: Three times a day (TID) | ORAL | 0 refills | Status: DC | PRN
Start: 2023-03-31 — End: 2023-04-28

## 2023-03-31 MED ORDER — SODIUM CHLORIDE 0.9% FLUSH
10.0000 mL | Freq: Once | INTRAVENOUS | Status: AC
  Administered 2023-03-31: 10 mL

## 2023-03-31 MED ORDER — ATROPINE SULFATE 1 MG/ML IV SOLN
0.5000 mg | Freq: Once | INTRAVENOUS | Status: AC | PRN
  Administered 2023-03-31: 0.5 mg via INTRAVENOUS
  Filled 2023-03-31: qty 1

## 2023-03-31 MED ORDER — PREGABALIN 150 MG PO CAPS
150.0000 mg | ORAL_CAPSULE | Freq: Three times a day (TID) | ORAL | 0 refills | Status: DC
Start: 2023-03-31 — End: 2023-05-16

## 2023-03-31 MED ORDER — SODIUM CHLORIDE 0.9 % IV SOLN
180.0000 mg/m2 | Freq: Once | INTRAVENOUS | Status: AC
  Administered 2023-03-31: 340 mg via INTRAVENOUS
  Filled 2023-03-31: qty 16.55

## 2023-03-31 MED ORDER — SODIUM CHLORIDE 0.9 % IV SOLN
5.0000 mg/kg | Freq: Once | INTRAVENOUS | Status: AC
  Administered 2023-03-31: 400 mg via INTRAVENOUS
  Filled 2023-03-31: qty 16

## 2023-03-31 MED ORDER — SODIUM CHLORIDE 0.9 % IV SOLN
400.0000 mg/m2 | Freq: Once | INTRAVENOUS | Status: AC
  Administered 2023-03-31: 760 mg via INTRAVENOUS
  Filled 2023-03-31: qty 38

## 2023-03-31 MED ORDER — PALONOSETRON HCL INJECTION 0.25 MG/5ML
0.2500 mg | Freq: Once | INTRAVENOUS | Status: AC
  Administered 2023-03-31: 0.25 mg via INTRAVENOUS
  Filled 2023-03-31: qty 5

## 2023-03-31 MED ORDER — SODIUM CHLORIDE 0.9 % IV SOLN: Freq: Once | INTRAVENOUS | Status: AC

## 2023-03-31 NOTE — Patient Instructions (Signed)
[facility]  Discharge Instructions: Thank you for choosing [facility] to provide your oncology and hematology care.   If you have a lab appointment with the Cancer Center, please go directly to the Cancer Center and check in at the registration area.   Wear comfortable clothing and clothing appropriate for easy access to any Portacath or PICC line.   We strive to give you quality time with your provider. You may need to reschedule your appointment if you arrive late (15 or more minutes).  Arriving late affects you and other patients whose appointments are after yours.  Also, if you miss three or more appointments without notifying the office, you may be dismissed from the clinic at the provider's discretion.      For prescription refill requests, have your pharmacy contact our office and allow 72 hours for refills to be completed.    Today you received the following chemotherapy and/or immunotherapy agents: Bevacizumab, Oxaliplatin, & Fluorouracil      To help prevent nausea and vomiting after your treatment, we encourage you to take your nausea medication as directed.  BELOW ARE SYMPTOMS THAT SHOULD BE REPORTED IMMEDIATELY: *FEVER GREATER THAN 100.4 F (38 C) OR HIGHER *CHILLS OR SWEATING *NAUSEA AND VOMITING THAT IS NOT CONTROLLED WITH YOUR NAUSEA MEDICATION *UNUSUAL SHORTNESS OF BREATH *UNUSUAL BRUISING OR BLEEDING *URINARY PROBLEMS (pain or burning when urinating, or frequent urination) *BOWEL PROBLEMS (unusual diarrhea, constipation, pain near the anus) TENDERNESS IN MOUTH AND THROAT WITH OR WITHOUT PRESENCE OF ULCERS (sore throat, sores in mouth, or a toothache) UNUSUAL RASH, SWELLING OR PAIN  UNUSUAL VAGINAL DISCHARGE OR ITCHING   Items with * indicate a potential emergency and should be followed up as soon as possible or go to the Emergency Department if any problems should occur.  Please show the CHEMOTHERAPY ALERT CARD or  IMMUNOTHERAPY ALERT CARD at check-in to the Emergency Department and triage nurse.  Should you have questions after your visit or need to cancel or reschedule your appointment, please contact [facility]  Dept: [phone]  and follow the prompts.  Office hours are 8:00 a.m. to 4:30 p.m. Monday - Friday. Please note that voicemails left after 4:00 p.m. may not be returned until the following business day.  We are closed weekends and major holidays. You have access to a nurse at all times for urgent questions. Please call the main number to the clinic Dept: [phone] and follow the prompts.   For any non-urgent questions, you may also contact your provider using MyChart. We now offer e-Visits for anyone 58 and older to request care online for non-urgent symptoms. For details visit [email or web address].   Also download the MyChart app! Go to the app store, search "MyChart", open the app, select [facility], and log in with your MyChart username and password.  Bevacizumab Injection What is this medication? BEVACIZUMAB (be va SIZ yoo mab) treats some types of cancer. It works by blocking a protein that causes cancer cells to grow and multiply. This helps to slow or stop the spread of cancer cells. It is a monoclonal antibody. This medicine may be used for other purposes; ask your health care provider or pharmacist if you have questions. COMMON BRAND NAME(S): Alymsys, Avastin, MVASI, Omer Jack What should I tell my care team before I take this medication? They need to know if you have any of these conditions: Blood clots Coughing up blood Having or  recent surgery Heart failure High blood pressure History of a connection between 2 or more body parts that do not usually connect (fistula) History of a tear in your stomach or intestines Protein in your urine An unusual or allergic reaction to bevacizumab, other medications, foods, dyes, or  preservatives Pregnant or trying to get pregnant Breast-feeding How should I use this medication? This medication is injected into a vein. It is given by your care team in a hospital or clinic setting. Talk to your care team the use of this medication in children. Special care may be needed. Overdosage: If you think you have taken too much of this medicine contact a poison control center or emergency room at once. NOTE: This medicine is only for you. Do not share this medicine with others. What if I miss a dose? Keep appointments for follow-up doses. It is important not to miss your dose. Call your care team if you are unable to keep an appointment. What may interact with this medication? Interactions are not expected. This list may not describe all possible interactions. Give your health care provider a list of all the medicines, herbs, non-prescription drugs, or dietary supplements you use. Also tell them if you smoke, drink alcohol, or use illegal drugs. Some items may interact with your medicine. What should I watch for while using this medication? Your condition will be monitored carefully while you are receiving this medication. You may need blood work while taking this medication. This medication may make you feel generally unwell. This is not uncommon as chemotherapy can affect healthy cells as well as cancer cells. Report any side effects. Continue your course of treatment even though you feel ill unless your care team tells you to stop. This medication may increase your risk to bruise or bleed. Call your care team if you notice any unusual bleeding. Before having surgery, talk to your care team to make sure it is ok. This medication can increase the risk of poor healing of your surgical site or wound. You will need to stop this medication for 28 days before surgery. After surgery, wait at least 28 days before restarting this medication. Make sure the surgical site or wound is healed enough  before restarting this medication. Talk to your care team if questions. Talk to your care team if you may be pregnant. Serious birth defects can occur if you take this medication during pregnancy and for 6 months after the last dose. Contraception is recommended while taking this medication and for 6 months after the last dose. Your care team can help you find the option that works for you. Do not breastfeed while taking this medication and for 6 months after the last dose. This medication can cause infertility. Talk to your care team if you are concerned about your fertility. What side effects may I notice from receiving this medication? Side effects that you should report to your care team as soon as possible: Allergic reactions--skin rash, itching, hives, swelling of the face, lips, tongue, or throat Bleeding--bloody or black, tar-like stools, vomiting blood or brown material that looks like coffee grounds, red or dark brown urine, small red or purple spots on skin, unusual bruising or bleeding Blood clot--pain, swelling, or warmth in the leg, shortness of breath, chest pain Heart attack--pain or tightness in the chest, shoulders, arms, or jaw, nausea, shortness of breath, cold or clammy skin, feeling faint or lightheaded Heart failure--shortness of breath, swelling of the ankles, feet, or hands, sudden weight gain,  unusual weakness or fatigue Increase in blood pressure Infection--fever, chills, cough, sore throat, wounds that don't heal, pain or trouble when passing urine, general feeling of discomfort or being unwell Infusion reactions--chest pain, shortness of breath or trouble breathing, feeling faint or lightheaded Kidney injury--decrease in the amount of urine, swelling of the ankles, hands, or feet Stomach pain that is severe, does not go away, or gets worse Stroke--sudden numbness or weakness of the face, arm, or leg, trouble speaking, confusion, trouble walking, loss of balance or  coordination, dizziness, severe headache, change in vision Sudden and severe headache, confusion, change in vision, seizures, which may be signs of posterior reversible encephalopathy syndrome (PRES) Side effects that usually do not require medical attention (report to your care team if they continue or are bothersome): Back pain Change in taste Diarrhea Dry skin Increased tears Nosebleed This list may not describe all possible side effects. Call your doctor for medical advice about side effects. You may report side effects to FDA at 1-800-FDA-1088. Where should I keep my medication? This medication is given in a hospital or clinic. It will not be stored at home. NOTE: This sheet is a summary. It may not cover all possible information. If you have questions about this medicine, talk to your doctor, pharmacist, or health care provider.  2024 [employer]vier/Gold Standard (2021-11-20 00:00:00)

## 2023-03-31 NOTE — Progress Notes (Signed)
Per Karena Addison, [place] ok to treat with ANC 1.2 K/uL

## 2023-03-31 NOTE — Progress Notes (Signed)
Palliative Medicine [facility]  [phone] [phone]   Name: Erica [name] Date: 03/31/2023 MRN: [id number]  DOB: 03-04-1966  Patient Care Team: System, Provider Not In as PCP - General Gollan, Tollie Pizza, MD as PCP - Cardiology (Cardiology) Delma Freeze, FNP as Nurse Practitioner (Family Medicine) Antonieta Iba, MD as Consulting Physician (Cardiology) Merwyn Katos, MD (Inactive) as Consulting Physician (Pulmonary Disease) Jim Like, RN as Registered Nurse Scarlett Presto, RN (Inactive) as Registered Nurse Benita Gutter, RN as Oncology Nurse Navigator Malachy Mood, MD as Consulting Physician (Oncology)    REASON FOR CONSULTATION: Erica [name] is a 57 y.o. female with oncologic medical history including stage IV rectal cancer (12/23/2022), CHF, COPD, diabetes, hyperlipidemia, hypertension, anxiety, and arthritis..  Palliative ask to see for symptom management and goals of care.    SOCIAL HISTORY:     reports that she has been smoking cigarettes. She has a 138 pack-year smoking history. She has never used smokeless tobacco. She reports current alcohol use. She reports that she does not use drugs.  ADVANCE DIRECTIVES:  None on file  CODE STATUS: Full code  PAST MEDICAL HISTORY: Past Medical History:  Diagnosis Date   (HFpEF) heart failure with preserved ejection fraction (HCC)    a. 05/2015 Echo: EF 60-65%, no rwma, PASP .   Acute pancreatitis 08/13/2018   Anxiety    Arthritis    Asthma    Bell's palsy    no deficit   Bronchitis    Cancer (HCC) 12/2022   rectal cancer   CHF (congestive heart failure) (HCC)    COPD (chronic obstructive pulmonary disease) (HCC)    Depression    Diabetes mellitus without complication (HCC)    type II 03/2017   Dyspnea    Fatty liver    per patient   Gall stones    per patient   Gastric ulcer    Hyperlipidemia    Hypertension    Lung nodule 12/2022   upper left    OSA (obstructive sleep apnea)    a. did not tolerate CPAP. On oxygen 2L via [place]   Pancreatitis    07/2018   Shoulder injury    6/19   Smoker     PAST SURGICAL HISTORY:  Past Surgical History:  Procedure Laterality Date   BRONCHIAL NEEDLE ASPIRATION BIOPSY  01/27/2023   Procedure: BRONCHIAL NEEDLE ASPIRATION BIOPSIES;  Surgeon: Raechel Chute, MD;  Location: [facility] ENDOSCOPY;  Service: Pulmonary;;   COLONOSCOPY WITH PROPOFOL N/A 12/15/2022   Procedure: COLONOSCOPY WITH PROPOFOL;  Surgeon: Wyline Mood, MD;  Location: [facility] ENDOSCOPY;  Service: Gastroenterology;  Laterality: N/A;   COLONOSCOPY WITH PROPOFOL N/A 12/16/2022   Procedure: COLONOSCOPY WITH PROPOFOL;  Surgeon: Wyline Mood, MD;  Location: [facility] ENDOSCOPY;  Service: Gastroenterology;  Laterality: N/A;   FLEXIBLE BRONCHOSCOPY N/A 06/20/2015   Procedure: FLEXIBLE BRONCHOSCOPY;  Surgeon: Shane Crutch, MD;  Location: [facility] ORS;  Service: Pulmonary;  Laterality: N/A;   IR IMAGING GUIDED PORT INSERTION  02/25/2023   KNEE SURGERY Left    8 knee surgeries    HEMATOLOGY/ONCOLOGY HISTORY:  Oncology History Overview Note   Cancer Staging  Rectal cancer [facility]) Staging form: Colon and Rectum, AJCC 8th Edition - Clinical stage from 12/23/2022: Stage IVA (cT3, cN2, pM1a) - Signed by Rickard Patience, MD on 02/04/2023 Stage prefix: Initial diagnosis     Rectal cancer (HCC)  12/15/2022 Pathology Results   Colonoscopy:   Positive for  invasive adenocarcinoma, G1 well differentiated      12/15/2022 Procedure   Colonoscopy under the care of Dr. Sharlet Salina   Findings: -The perianal and digital rectal examinations were normal. -Extensive amounts of liquid semi- liquid semi- solid stool was found in the entire colon -A partially obstructing large mass was found in the rectum. The mass was circumferential.   12/23/2022 Initial Diagnosis   Rectal cancer [facility]) Patient was seen by gastroenterology due to hematochezia as well as unintentional weight  loss.  12/15/2022, colonoscopy showed fungating partially obstructing large mass in the rectum, circumferential, mass extended from about 15 cm from anus to about 8 cm from anal verge.  Biopsy was taken. Poor preparation of colon.  Colonoscopy was repeated on 12/16/2022, preparations inadequate.  Pathology showed invasive adenocarcinoma.   12/23/2022 Cancer Staging   Staging form: Colon and Rectum, AJCC 8th Edition - Clinical stage from 12/23/2022: Stage Unknown (cTX, cNX) - Signed by Rickard Patience, MD on 12/23/2022 Stage prefix: Initial diagnosis  Staging form: Colon and Rectum, AJCC 8th Edition - Clinical stage from 12/23/2022: Stage IVA (cT3, cN2, pM1a) - Signed by Rickard Patience, MD on 02/04/2023 Stage prefix: Initial diagnosis   12/23/2022 Tumor Marker   Patient's tumor was tested for the following markers: CEA. Results of the tumor marker test revealed elevated at 18.1.   01/03/2023 Imaging   CT chest abdomen with contrast showed 2.2 cm left upper lobe nodule, suspicious for metastasis. No findings suspicious for metastatic disease in the abdomen. Bilateral adrenal adenomas, benign. Cholelithiasis, without associated inflammatory changes. Aortic Atherosclerosis (ICD10-I70.0) and Emphysema   01/08/2023 Imaging   MRI pelvis without contrast-rectal protocol showed 9.2 cm concentric mid/lower rectal mass, corresponding to the patient's known primary rectal adenocarcinoma, as above. Rectal adenocarcinoma T stage: T3c Rectal adenocarcinoma N stage:  N2 Distance from tumor to the internal anal sphincter is 3.4 cm.   01/27/2023 Procedure   Patient underwent biopsy via bronchoscopy by pulmonology. Biopsy pathology showed adenocarcinoma, Immunohistochemistry is positive with cytokeratin 20 and CDX2 and negative with cytokeratin 7, TTF-1 and napsin A consistent with colonic adenocarcinoma    01/27/2023 Pathology Results   CASE: [id number]   FINAL MICROSCOPIC DIAGNOSIS:  A. LUNG, LUL, FINE NEEDLE  ASPIRATION:  Adenocarcinoma  See comment   ADDENDUM:  Immunohistochemistry is positive with cytokeratin 20 and CDX2  and negative with cytokeratin 7, TTF-1 and napsin A consistent with  colonic adenocarcinoma.    02/28/2023 Miscellaneous      03/03/2023 -  Chemotherapy   Patient is on Treatment Plan : COLORECTAL FOLFIRI q14d       ALLERGIES:  is allergic to tramadol and penicillins.  MEDICATIONS:  Current Outpatient Medications  Medication Sig Dispense Refill   albuterol (VENTOLIN HFA) 108 (90 Base) MCG/ACT inhaler Inhale 2 puffs into the lungs every 6 (six) hours as needed for wheezing or shortness of breath.      Blood Glucose Monitoring Suppl (ACCU-CHEK AVIVA PLUS) w/Device KIT See admin instructions.  0   busPIRone (BUSPAR) 30 MG tablet Take 30 mg by mouth 2 (two) times daily. PER PT STATES HER DOSE WAS INCREASED     clonazePAM (KLONOPIN) 0.5 MG tablet Take 0.5 mg by mouth at bedtime as needed for anxiety.     diltiazem (CARDIZEM CD) 120 MG 24 hr capsule TAKE 1 CAPSULE BY MOUTH EVERY DAY 60 capsule 1   diltiazem (CARDIZEM) 30 MG tablet TAKE 1 TABLET(30 MG) BY MOUTH THREE TIMES DAILY AS NEEDED 270 tablet 0  DULoxetine (CYMBALTA) 60 MG capsule Take 60 mg by mouth 2 (two) times daily.      ezetimibe (ZETIA) 10 MG tablet Take 10 mg by mouth at bedtime.     Fluticasone-Salmeterol (ADVAIR) 250-50 MCG/DOSE AEPB Inhale 1 puff into the lungs 2 (two) times daily.     furosemide (LASIX) 40 MG tablet TAKE 1 TABLET BY MOUTH EVERY DAY. MAY TAKE ADDITIONAL TABLET IN THE AM FOR WEIGHT GAIN AS NEEDED 110 tablet 2   JARDIANCE 25 MG TABS tablet Take 25 mg by mouth daily.     lidocaine-prilocaine (EMLA) cream Apply to affected area once 30 g 3   metFORMIN (GLUCOPHAGE-XR) 500 MG 24 hr tablet Take 1,000 mg by mouth 2 (two) times daily.     ondansetron (ZOFRAN) 8 MG tablet Take 1 tablet (8 mg total) by mouth every 8 (eight) hours as needed for nausea, vomiting or refractory nausea / vomiting. Start on  the third day after chemotherapy. 30 tablet 1   Oxycodone HCl 10 MG TABS Take 1 tablet (10 mg total) by mouth 3 (three) times daily as needed. 90 tablet 0   OXYGEN Inhale 2 L into the lungs at bedtime. Or as needed throughout the day if short of breath     pantoprazole (PROTONIX) 40 MG tablet Take 1 tablet (40 mg total) by mouth daily. 30 tablet 0   potassium chloride SA (KLOR-CON) 20 MEQ tablet Take 20 mEq by mouth 2 (two) times daily.     pregabalin (LYRICA) 150 MG capsule Take 1 capsule (150 mg total) by mouth 3 (three) times daily. 90 capsule 0   prochlorperazine (COMPAZINE) 10 MG tablet Take 1 tablet (10 mg total) by mouth every 6 (six) hours as needed for nausea or vomiting. 30 tablet 1   spironolactone (ALDACTONE) 25 MG tablet TAKE 1 TABLET(25 MG) BY MOUTH EVERY MORNING 90 tablet 0   sucralfate (CARAFATE) 1 G tablet Take 1 tablet (1 g total) by mouth 4 (four) times daily -  with meals and at bedtime. (Patient taking differently: Take 1 g by mouth 2 (two) times daily.) 120 tablet 0   tiotropium (SPIRIVA) 18 MCG inhalation capsule Place 18 mcg into inhaler and inhale daily.     tiZANidine (ZANAFLEX) 4 MG tablet Take 8 mg by mouth 3 (three) times daily.     Varenicline Tartrate, Starter, 0.5 MG X 11 & 1 MG X 42 TBPK See admin instructions. follow package directions     zolpidem (AMBIEN) 10 MG tablet Take 10 mg by mouth at bedtime as needed for sleep.     No current facility-administered medications for this visit.   Facility-Administered Medications Ordered in Other Visits  Medication Dose Route Frequency Provider Last Rate Last Admin   atropine injection 0.5 mg  0.5 mg Intravenous Once PRN Malachy Mood, MD       fluorouracil (ADRUCIL) 5,000 mg in sodium chloride 0.9 % 150 mL chemo infusion  2,400 mg/m2 (Treatment Plan Recorded) Intravenous 1 day or 1 dose Malachy Mood, MD       fluorouracil (ADRUCIL) chemo injection 750 mg  400 mg/m2 (Treatment Plan Recorded) Intravenous Once Malachy Mood, MD        irinotecan (CAMPTOSAR) 340 mg in sodium chloride 0.9 % 500 mL chemo infusion  180 mg/m2 (Treatment Plan Recorded) Intravenous Once Malachy Mood, MD 345 mL/hr at 03/31/23 1322 340 mg at 03/31/23 1322   leucovorin 760 mg in sodium chloride 0.9 % 250 mL infusion  400 mg/m2 (Treatment  Plan Recorded) Intravenous Once Malachy Mood, MD 192 mL/hr at 03/31/23 1321 760 mg at 03/31/23 1321    VITAL SIGNS: BP 123/66 (BP Location: Left Arm, Patient Position: Sitting)   Pulse 94   Temp 98.4 F (36.9 C) (Oral)   Ht 5\' 8"  (1.727 m)   Wt 172 lb (78 kg)   LMP 03/13/2015 (Approximate) Comment: more than 2 years ago  SpO2 98%   BMI 26.15 kg/m  Filed Weights   03/31/23 1015  Weight: 172 lb (78 kg)    Estimated body mass index is 26.15 kg/m as calculated from the following:   Height as of this encounter: 5\' 8"  (1.727 m).   Weight as of this encounter: 172 lb (78 kg).  LABS: CBC:    Component Value Date/Time   WBC 3.8 (L) 03/31/2023 1119   WBC 8.4 01/27/2023 0657   HGB 13.1 03/31/2023 1119   HGB 13.9 12/09/2022 1515   HCT 40.4 03/31/2023 1119   HCT 41.4 12/09/2022 1515   PLT 228 03/31/2023 1119   PLT 288 12/09/2022 1515   MCV 92.4 03/31/2023 1119   MCV 91 12/09/2022 1515   MCV 102 (H) 02/26/2014 1826   NEUTROABS 1.2 (L) 03/31/2023 1119   NEUTROABS 5.6 12/09/2022 1515   LYMPHSABS 1.8 03/31/2023 1119   LYMPHSABS 2.4 12/09/2022 1515   MONOABS 0.4 03/31/2023 1119   EOSABS 0.2 03/31/2023 1119   EOSABS 0.5 (H) 12/09/2022 1515   BASOSABS 0.1 03/31/2023 1119   BASOSABS 0.1 12/09/2022 1515   Comprehensive Metabolic Panel:    Component Value Date/Time   NA 136 03/31/2023 1119   NA 140 04/22/2016 1532   NA 139 02/26/2014 1826   K 4.5 03/31/2023 1119   K 3.7 02/26/2014 1826   CL 101 03/31/2023 1119   CL 104 02/26/2014 1826   CO2 26 03/31/2023 1119   CO2 27 02/26/2014 1826   BUN 8 03/31/2023 1119   BUN 10 04/22/2016 1532   BUN 4 (L) 02/26/2014 1826   CREATININE 0.77 03/31/2023 1119    CREATININE 0.74 02/26/2014 1826   GLUCOSE 189 (H) 03/31/2023 1119   GLUCOSE 132 (H) 02/26/2014 1826   CALCIUM 9.8 03/31/2023 1119   CALCIUM 8.3 (L) 02/26/2014 1826   AST 13 (L) 03/31/2023 1119   ALT 20 03/31/2023 1119   ALT 57 03/10/2013 0100   ALKPHOS 85 03/31/2023 1119   ALKPHOS 89 03/10/2013 0100   BILITOT 0.5 03/31/2023 1119   PROT 7.4 03/31/2023 1119   PROT 7.6 03/10/2013 0100   ALBUMIN 4.2 03/31/2023 1119   ALBUMIN 3.6 03/10/2013 0100    RADIOGRAPHIC STUDIES: No results found.  PERFORMANCE STATUS (ECOG) : 1 - Symptomatic but completely ambulatory  Review of Systems  Constitutional:  Positive for fatigue.  Gastrointestinal:  Positive for abdominal pain.  Musculoskeletal:  Positive for arthralgias.  Unless otherwise noted, a complete review of systems is negative.  Physical Exam General: NAD Cardiovascular: regular rate and rhythm Pulmonary: clear ant fields Abdomen: soft, nontender, + bowel sounds Extremities: no edema, no joint deformities Skin: no rashes Neurological: AAOx4  IMPRESSION: This is my initial visit with Ms. Slaubaugh.  No acute distress noted.  Patient presents to clinic alone.  She is ambulatory without assistive devices.  Alert and oriented able to engage appropriately in discussions.  I introduced myself, Maygan RN, and Palliative's role in collaboration with the oncology team. Concept of Palliative Care was introduced as specialized medical care for people and their families living with serious illness.  It focuses on providing relief from the symptoms and stress of a serious illness.  The goal is to improve quality of life for both the patient and the family. Values and goals of care important to patient and family were attempted to be elicited.   Ms. Tessmann lives in the home alone.  She has 2 daughters and 5 grandchildren.  One of her daughters lives next door.  Is a former owner of a bar.  At home she is able to perform all ADLs independently  however with some limitations due to fatigue and pain.  States she has been disabled for more than 8 years.  Appetite fluctuates.  Some days are better than others.  Has noticed some decrease over the past several weeks.  Chronic pain/neoplasm related pain/neuropathic pain Saiyuri states she has chronic back and joint pain.  She has previously been on pain medication for years due to this however her pain has recently worsened specifically due to her cancer.  She was previously under the care of a pain clinic.  Reports her pain is in her lower back, hips, knees, and now in her abdominal area.  Describes her pain as constant, throbbing, sharp, and radiating.  Pain worsens with certain positions and activity.  Nothing relieves her pain but it does improve with medication.  Extensive medication, chart, and social history review completed. Patient denies any alcohol or illicit drug use. Tialisa expresses in the past use of alcohol however that has been over 6 years ago. Education provided on criteria for ongoing pain support via palliative team. Pain contract presented with extensive education to allow for safe management. Patient verbalized understanding and willingly signed pain contract. Copy provided.    We reviewed her previous pain medication use. PDMP was reviewed. Patient was previously taking MS Contin 15mg  for more than 2 years. She states once she transferred to a different provider at the pain clinic he chose to discontinue and focus more on procedure base and short acting medication. Her last MS Contin was in December 2023. She is currently taking Lyrica 150mg  three times daily. Discussed continuing for neuropathic pain.   Ms. Espericueta reports her pain at worst is 8-9 out of 10.  When she takes her pain medication pain decreases to 3 out of 10.  Given relief with pain medication we will continue with current regimen of oxycodone 10 mg every 8 hours.  Understands we will continue to closely monitor with  strong consideration of possibly reintroducing long-acting pain medication.  She verbalized understanding.  Constipation Education provided on the importance of bowel regimen in the setting of opioid use.  Goals of care We discussed her current illness and what it means in the larger context of her on-going co-morbidities. Natural disease trajectory and expectations were discussed.  Ms. Balmer is realistic in her understanding.  Verbalize goal is to continue to treat the treatable allow her every opportunity to continue to thrive.  Is taking things oneday at a time.  I discussed the importance of continued conversation with family and their medical providers regarding overall plan of care and treatment options, ensuring decisions are within the context of the patients values and GOCs.  PLAN: Established therapeutic relationship. Education provided on palliative's role in collaboration with their Oncology/Radiation team. Pain contract reviewed and signed.  Patient has received a copy. Lyrica 150 mg 3 times daily Oxycodone 10 mg every 8 hours as needed MiraLAX twice daily for bowel regimen Ongoing symptom management support. I will plan  to see patient back in 3-4 weeks in collaboration to other oncology appointments.    Patient expressed understanding and was in agreement with this plan. She also understands that She can call the clinic at any time with any questions, concerns, or complaints.   Thank you for your referral and allowing Palliative to assist in Mr./Mrs. Geroge Baseman Reidel's care.   Number and complexity of problems addressed: HIGH - 1 or more chronic illnesses with SEVERE exacerbation, progression, or side effects of treatment - advanced cancer, pain. Any controlled substances utilized were prescribed in the context of palliative care.   Visit consisted of counseling and education dealing with the complex and emotionally intense issues of symptom management and palliative care in  the setting of serious and potentially life-threatening illness.Greater than 50%  of this time was spent counseling and coordinating care related to the above assessment and plan.  Signed by: Willette Alma, AGPCNP-BC Palliative Medicine Team/[facility]   *Please note that this is a verbal dictation therefore any spelling or grammatical errors are due to the "[employer] One" system interpretation.

## 2023-03-31 NOTE — Progress Notes (Signed)
[facility]   [phone] [phone]) [phone]   Clinic Follow up Note   Patient Care Team: System, Provider Not In as PCP - General Gollan, Tollie Pizza, MD as PCP - Cardiology (Cardiology) Delma Freeze, FNP as Nurse Practitioner (Family Medicine) Mariah Milling, Tollie Pizza, MD as Consulting Physician (Cardiology) Merwyn Katos, MD (Inactive) as Consulting Physician (Pulmonary Disease) Jim Like, RN as Registered Nurse Scarlett Presto, RN (Inactive) as Registered Nurse Benita Gutter, RN as Oncology Nurse Navigator Malachy Mood, MD as Consulting Physician (Oncology)  Date of Service:  03/31/2023  CHIEF COMPLAINT: f/u of Rectal cancer  CURRENT THERAPY:  FOLFIRI q14d   SUMMARY OF ONCOLOGIC HISTORY: Oncology History Overview Note   Cancer Staging  Rectal cancer [facility]) Staging form: Colon and Rectum, AJCC 8th Edition - Clinical stage from 12/23/2022: Stage IVA (cT3, cN2, pM1a) - Signed by Rickard Patience, MD on 02/04/2023 Stage prefix: Initial diagnosis     Rectal cancer (HCC)  12/15/2022 Pathology Results   Colonoscopy:   Positive for invasive adenocarcinoma, G1 well differentiated      12/15/2022 Procedure   Colonoscopy under the care of Dr. Sharlet Salina   Findings: -The perianal and digital rectal examinations were normal. -Extensive amounts of liquid semi- liquid semi- solid stool was found in the entire colon -A partially obstructing large mass was found in the rectum. The mass was circumferential.   12/23/2022 Initial Diagnosis   Rectal cancer [facility]) Patient was seen by gastroenterology due to hematochezia as well as unintentional weight loss.  12/15/2022, colonoscopy showed fungating partially obstructing large mass in the rectum, circumferential, mass extended from about 15 cm from anus to about 8 cm from anal verge.  Biopsy was taken. Poor preparation of colon.  Colonoscopy was repeated on 12/16/2022, preparations inadequate.  Pathology showed invasive  adenocarcinoma.   12/23/2022 Cancer Staging   Staging form: Colon and Rectum, AJCC 8th Edition - Clinical stage from 12/23/2022: Stage Unknown (cTX, cNX) - Signed by Rickard Patience, MD on 12/23/2022 Stage prefix: Initial diagnosis  Staging form: Colon and Rectum, AJCC 8th Edition - Clinical stage from 12/23/2022: Stage IVA (cT3, cN2, pM1a) - Signed by Rickard Patience, MD on 02/04/2023 Stage prefix: Initial diagnosis   12/23/2022 Tumor Marker   Patient's tumor was tested for the following markers: CEA. Results of the tumor marker test revealed elevated at 18.1.   01/03/2023 Imaging   CT chest abdomen with contrast showed 2.2 cm left upper lobe nodule, suspicious for metastasis. No findings suspicious for metastatic disease in the abdomen. Bilateral adrenal adenomas, benign. Cholelithiasis, without associated inflammatory changes. Aortic Atherosclerosis (ICD10-I70.0) and Emphysema   01/08/2023 Imaging   MRI pelvis without contrast-rectal protocol showed 9.2 cm concentric mid/lower rectal mass, corresponding to the patient's known primary rectal adenocarcinoma, as above. Rectal adenocarcinoma T stage: T3c Rectal adenocarcinoma N stage:  N2 Distance from tumor to the internal anal sphincter is 3.4 cm.   01/27/2023 Procedure   Patient underwent biopsy via bronchoscopy by pulmonology. Biopsy pathology showed adenocarcinoma, Immunohistochemistry is positive with cytokeratin 20 and CDX2 and negative with cytokeratin 7, TTF-1 and napsin A consistent with colonic adenocarcinoma    01/27/2023 Pathology Results   CASE: [id number]   FINAL MICROSCOPIC DIAGNOSIS:  A. LUNG, LUL, FINE NEEDLE ASPIRATION:  Adenocarcinoma  See comment   ADDENDUM:  Immunohistochemistry is positive with cytokeratin 20 and CDX2  and negative with cytokeratin 7, TTF-1 and napsin A consistent with  colonic adenocarcinoma.    02/28/2023 Miscellaneous  03/03/2023 -  Chemotherapy   Patient is on Treatment Plan : COLORECTAL FOLFIRI  q14d        INTERVAL HISTORY:  Erica [name] is here for a follow up of rectal cancer. She was last seen by Dr. Mosetta Putt on 8//29/2024. She is unaccompanied for this visit.  Erica [name] reports she is tolerating chemotherapy without any new or concerning toxicities. Her energy and appetite are stable. She has lost approximately 7 lbs since the last visit. She denies nausea or vomiting. She does have intermittent episodes of diarrhea versus constipation that is well controlled with supportive medications. She has stable rectal bleeding with each bowel movement. She has no other signs of bleeding or bruising. 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 She denies fevers, chills, sweats, shortness of breath, chest pain or cough. She has no other complaints.   All other systems were reviewed with the patient and are negative.  MEDICAL HISTORY:  Past Medical History:  Diagnosis Date   (HFpEF) heart failure with preserved ejection fraction (HCC)    a. 05/2015 Echo: EF 60-65%, no rwma, PASP .   Acute pancreatitis 08/13/2018   Anxiety    Arthritis    Asthma    Bell's palsy    no deficit   Bronchitis    Cancer (HCC) 12/2022   rectal cancer   CHF (congestive heart failure) (HCC)    COPD (chronic obstructive pulmonary disease) (HCC)    Depression    Diabetes mellitus without complication (HCC)    type II 03/2017   Dyspnea    Fatty liver    per patient   Gall stones    per patient   Gastric ulcer    Hyperlipidemia    Hypertension    Lung nodule 12/2022   upper left   OSA (obstructive sleep apnea)    a. did not tolerate CPAP. On oxygen 2L via [place]   Pancreatitis    07/2018   Shoulder injury    6/19   Smoker     SURGICAL HISTORY: Past Surgical History:  Procedure Laterality Date   BRONCHIAL NEEDLE ASPIRATION BIOPSY  01/27/2023   Procedure: BRONCHIAL NEEDLE ASPIRATION BIOPSIES;  Surgeon: Raechel Chute, MD;  Location: [facility] ENDOSCOPY;  Service: Pulmonary;;   COLONOSCOPY WITH  PROPOFOL N/A 12/15/2022   Procedure: COLONOSCOPY WITH PROPOFOL;  Surgeon: Wyline Mood, MD;  Location: [facility] ENDOSCOPY;  Service: Gastroenterology;  Laterality: N/A;   COLONOSCOPY WITH PROPOFOL N/A 12/16/2022   Procedure: COLONOSCOPY WITH PROPOFOL;  Surgeon: Wyline Mood, MD;  Location: [facility] ENDOSCOPY;  Service: Gastroenterology;  Laterality: N/A;   FLEXIBLE BRONCHOSCOPY N/A 06/20/2015   Procedure: FLEXIBLE BRONCHOSCOPY;  Surgeon: Shane Crutch, MD;  Location: [facility] ORS;  Service: Pulmonary;  Laterality: N/A;   IR IMAGING GUIDED PORT INSERTION  02/25/2023   KNEE SURGERY Left    8 knee surgeries    I have reviewed the social history and family history with the patient and they are unchanged from previous note.  ALLERGIES:  is allergic to tramadol and penicillins.  MEDICATIONS:  Current Outpatient Medications  Medication Sig Dispense Refill   albuterol (VENTOLIN HFA) 108 (90 Base) MCG/ACT inhaler Inhale 2 puffs into the lungs every 6 (six) hours as needed for wheezing or shortness of breath.      Blood Glucose Monitoring Suppl (ACCU-CHEK AVIVA PLUS) w/Device KIT See admin instructions.  0   busPIRone (BUSPAR) 30 MG tablet Take 30 mg by mouth 2 (two) times daily. PER PT STATES HER DOSE  WAS INCREASED     clonazePAM (KLONOPIN) 0.5 MG tablet Take 0.5 mg by mouth at bedtime as needed for anxiety.     diltiazem (CARDIZEM CD) 120 MG 24 hr capsule TAKE 1 CAPSULE BY MOUTH EVERY DAY 60 capsule 1   diltiazem (CARDIZEM) 30 MG tablet TAKE 1 TABLET(30 MG) BY MOUTH THREE TIMES DAILY AS NEEDED 270 tablet 0   DULoxetine (CYMBALTA) 60 MG capsule Take 60 mg by mouth 2 (two) times daily.      ezetimibe (ZETIA) 10 MG tablet Take 10 mg by mouth at bedtime.     Fluticasone-Salmeterol (ADVAIR) 250-50 MCG/DOSE AEPB Inhale 1 puff into the lungs 2 (two) times daily.     furosemide (LASIX) 40 MG tablet TAKE 1 TABLET BY MOUTH EVERY DAY. MAY TAKE ADDITIONAL TABLET IN THE AM FOR WEIGHT GAIN AS NEEDED 110 tablet 2    JARDIANCE 25 MG TABS tablet Take 25 mg by mouth daily.     lidocaine-prilocaine (EMLA) cream Apply to affected area once 30 g 3   metFORMIN (GLUCOPHAGE-XR) 500 MG 24 hr tablet Take 1,000 mg by mouth 2 (two) times daily.     ondansetron (ZOFRAN) 8 MG tablet Take 1 tablet (8 mg total) by mouth every 8 (eight) hours as needed for nausea, vomiting or refractory nausea / vomiting. Start on the third day after chemotherapy. 30 tablet 1   Oxycodone HCl 10 MG TABS Take 1 tablet (10 mg total) by mouth 3 (three) times daily as needed. 90 tablet 0   OXYGEN Inhale 2 L into the lungs at bedtime. Or as needed throughout the day if short of breath     pantoprazole (PROTONIX) 40 MG tablet Take 1 tablet (40 mg total) by mouth daily. 30 tablet 0   potassium chloride SA (KLOR-CON) 20 MEQ tablet Take 20 mEq by mouth 2 (two) times daily.     pregabalin (LYRICA) 150 MG capsule Take 1 capsule (150 mg total) by mouth 3 (three) times daily. 90 capsule 0   prochlorperazine (COMPAZINE) 10 MG tablet Take 1 tablet (10 mg total) by mouth every 6 (six) hours as needed for nausea or vomiting. 30 tablet 1   spironolactone (ALDACTONE) 25 MG tablet TAKE 1 TABLET(25 MG) BY MOUTH EVERY MORNING 90 tablet 0   sucralfate (CARAFATE) 1 G tablet Take 1 tablet (1 g total) by mouth 4 (four) times daily -  with meals and at bedtime. (Patient taking differently: Take 1 g by mouth 2 (two) times daily.) 120 tablet 0   tiotropium (SPIRIVA) 18 MCG inhalation capsule Place 18 mcg into inhaler and inhale daily.     tiZANidine (ZANAFLEX) 4 MG tablet Take 8 mg by mouth 3 (three) times daily.     Varenicline Tartrate, Starter, 0.5 MG X 11 & 1 MG X 42 TBPK See admin instructions. follow package directions     zolpidem (AMBIEN) 10 MG tablet Take 10 mg by mouth at bedtime as needed for sleep.     cephALEXin (KEFLEX) 500 MG capsule Take 1 capsule (500 mg total) by mouth 4 (four) times daily. (Patient not taking: Reported on 03/31/2023) 40 capsule 0   No  current facility-administered medications for this visit.    PHYSICAL EXAMINATION: ECOG PERFORMANCE STATUS: 1 - Symptomatic but completely ambulatory  Vitals:   03/31/23 1140  BP: (!) 145/69  Pulse: 86  Resp: 20  Temp: (!) 97.2 F (36.2 C)  SpO2: 100%   Wt Readings from Last 3 Encounters:  03/31/23 170 lb  9.6 oz (77.4 kg)  03/31/23 172 lb (78 kg)  03/17/23 179 lb 9.6 oz (81.5 kg)     GENERAL:alert, no distress and comfortable SKIN: skin color, texture, turgor are normal, no rashes or significant lesions EYES: normal, Conjunctiva are pink and non-injected, sclera clear LYMPH:  no palpable lymphadenopathy in the cervical LUNGS: clear to auscultation and percussion with normal breathing effort HEART: regular rate & rhythm and no murmurs and no lower extremity edema Musculoskeletal:no cyanosis of digits and no clubbing  NEURO: alert & oriented x 3 with fluent speech, no focal motor/sensory deficits  LABORATORY DATA:  I have reviewed the data as listed    Latest Ref Rng & Units 03/31/2023   11:19 AM 03/17/2023   10:58 AM 03/03/2023    9:47 AM  CBC  WBC 4.0 - 10.5 K/uL 3.8  4.3  7.6   Hemoglobin 12.0 - 15.0 g/dL 16.1  09.6  04.5   Hematocrit 36.0 - 46.0 % 40.4  35.6  38.5   Platelets 150 - 400 K/uL 228  247  203         Latest Ref Rng & Units 03/17/2023   10:58 AM 03/03/2023    9:47 AM 02/17/2023   10:09 AM  CMP  Glucose 70 - 99 mg/dL 409  811  914   BUN 6 - 20 mg/dL 13  12  11    Creatinine 0.44 - 1.00 mg/dL 7.82  9.56  2.13   Sodium 135 - 145 mmol/L 137  135  134   Potassium 3.5 - 5.1 mmol/L 4.4  4.1  3.9   Chloride 98 - 111 mmol/L 104  97  98   CO2 22 - 32 mmol/L 26  30  26    Calcium 8.9 - 10.3 mg/dL 8.7  9.1  9.3   Total Protein 6.5 - 8.1 g/dL 6.6  7.0  7.5   Total Bilirubin 0.3 - 1.2 mg/dL 0.4  0.4  0.6   Alkaline Phos 38 - 126 U/L 74  85  79   AST 15 - 41 U/L 12  14  14    ALT 0 - 44 U/L 13  12  13        RADIOGRAPHIC STUDIES: I have personally reviewed the  radiological images as listed and agreed with the findings in the report. No results found.   ASSESSMENT:  Erica [name] is a 57 y.o. female returns for a follow up for metastatic rectal cancer.    Rectal cancer (HCC) cT3cN2M1, stage IV, intact MMR, KRAS G12V (+) -We reviewed her medical record and workup in detail with the patient and her daughter.  She presented with hematochezia since 05/2022 -Workup showed a near obstructing rectal mass. Path confirmed adenocarcinoma. Baseline CEA elevated to 18.1.  - Local staging pelvic MRI 01/03/23 showed T3cN2 disease, and lung biopsy confirmed oligo lung metastasis -Started neoadjuvant chemotherapy FOLFIRI (not able to give FOLFOX due to baseline neuropathy) on 03/03/2023 -Tempus results showed K-ras mutation, so she is not a candidate for EGFR inhibitor.  Will plan to add bevacizumab to her chemo starting Cycle 3.    PLAN: -Due for Cycle 3, Day 1 of FOLFIRI plus Bevacizumab today -Labs form today were reviewed and adequate for treatment. WBC 3.8, Hgb 13.1, Plt 228, ANC 1.2. Creatinine normal. LFTs in range -Proceed with treatment without any dose modifications -Will add GCSF injection on Day 3 to prevent worsening neutropenia -RTC in 2 weeks with labs and follow up prior to Cycle 4,  Day 1.    No orders of the defined types were placed in this encounter.  All questions were answered. The patient knows to call the clinic with any problems, questions or concerns. No barriers to learning was detected.  I have spent a total of 30 minutes minutes of face-to-face and non-face-to-face time, preparing to see the patient,performing a medically appropriate examination, counseling and educating the patient, ordering medications, documenting clinical information in the electronic health record, independently interpreting results and communicating results to the patient, and care coordination.   Georga Kaufmann PA-C Dept of Hematology and Oncology [facility]  Cancer Center at [facility] Phone: [phone]

## 2023-04-02 ENCOUNTER — Inpatient Hospital Stay: Payer: Medicaid Other

## 2023-04-02 VITALS — BP 139/67 | HR 93 | Temp 98.2°F

## 2023-04-02 DIAGNOSIS — C20 Malignant neoplasm of rectum: Secondary | ICD-10-CM

## 2023-04-02 DIAGNOSIS — Z5111 Encounter for antineoplastic chemotherapy: Secondary | ICD-10-CM | POA: Diagnosis not present

## 2023-04-02 MED ORDER — PEGFILGRASTIM-CBQV 6 MG/0.6ML ~~LOC~~ SOSY
6.0000 mg | PREFILLED_SYRINGE | Freq: Once | SUBCUTANEOUS | Status: AC
  Administered 2023-04-02: 6 mg via SUBCUTANEOUS

## 2023-04-02 MED ORDER — HEPARIN SOD (PORK) LOCK FLUSH 100 UNIT/ML IV SOLN
500.0000 [IU] | Freq: Once | INTRAVENOUS | Status: AC | PRN
  Administered 2023-04-02: 500 [IU]

## 2023-04-02 MED ORDER — SODIUM CHLORIDE 0.9% FLUSH
10.0000 mL | INTRAVENOUS | Status: DC | PRN
  Administered 2023-04-02: 10 mL

## 2023-04-13 MED FILL — Dexamethasone Sodium Phosphate Inj 100 MG/10ML: INTRAMUSCULAR | Qty: 1 | Status: AC

## 2023-04-14 ENCOUNTER — Other Ambulatory Visit (HOSPITAL_COMMUNITY): Payer: Self-pay

## 2023-04-14 ENCOUNTER — Encounter: Payer: Self-pay | Admitting: Hematology

## 2023-04-14 ENCOUNTER — Other Ambulatory Visit: Payer: Self-pay

## 2023-04-14 ENCOUNTER — Inpatient Hospital Stay: Payer: Medicaid Other

## 2023-04-14 ENCOUNTER — Inpatient Hospital Stay: Payer: Medicaid Other | Admitting: Hematology

## 2023-04-14 VITALS — BP 127/64 | HR 87 | Temp 98.3°F | Resp 18 | Ht 68.0 in | Wt 170.8 lb

## 2023-04-14 DIAGNOSIS — C20 Malignant neoplasm of rectum: Secondary | ICD-10-CM

## 2023-04-14 DIAGNOSIS — Z95828 Presence of other vascular implants and grafts: Secondary | ICD-10-CM

## 2023-04-14 DIAGNOSIS — Z5111 Encounter for antineoplastic chemotherapy: Secondary | ICD-10-CM | POA: Diagnosis not present

## 2023-04-14 LAB — CMP (CANCER CENTER ONLY)
ALT: 18 U/L (ref 0–44)
AST: 15 U/L (ref 15–41)
Albumin: 3.8 g/dL (ref 3.5–5.0)
Alkaline Phosphatase: 99 U/L (ref 38–126)
Anion gap: 11 (ref 5–15)
BUN: 10 mg/dL (ref 6–20)
CO2: 27 mmol/L (ref 22–32)
Calcium: 9.1 mg/dL (ref 8.9–10.3)
Chloride: 97 mmol/L — ABNORMAL LOW (ref 98–111)
Creatinine: 0.81 mg/dL (ref 0.44–1.00)
GFR, Estimated: >60 mL/min (ref >60)
Glucose, Bld: 214 mg/dL — ABNORMAL HIGH (ref 70–99)
Potassium: 3.8 mmol/L (ref 3.5–5.1)
Sodium: 135 mmol/L (ref 135–145)
Total Bilirubin: 0.4 mg/dL (ref 0.3–1.2)
Total Protein: 6.9 g/dL (ref 6.5–8.1)

## 2023-04-14 LAB — CBC WITH DIFFERENTIAL (CANCER CENTER ONLY)
Abs Immature Granulocytes: 0.16 K/uL — ABNORMAL HIGH (ref 0.00–0.07)
Basophils Absolute: 0.1 K/uL (ref 0.0–0.1)
Basophils Relative: 1 %
Eosinophils Absolute: 0.2 K/uL (ref 0.0–0.5)
Eosinophils Relative: 2 %
HCT: 39.1 % (ref 36.0–46.0)
Hemoglobin: 12.7 g/dL (ref 12.0–15.0)
Immature Granulocytes: 2 %
Lymphocytes Relative: 19 %
Lymphs Abs: 1.9 K/uL (ref 0.7–4.0)
MCH: 30.2 pg (ref 26.0–34.0)
MCHC: 32.5 g/dL (ref 30.0–36.0)
MCV: 93.1 fL (ref 80.0–100.0)
Monocytes Absolute: 0.7 K/uL (ref 0.1–1.0)
Monocytes Relative: 7 %
Neutro Abs: 6.9 K/uL (ref 1.7–7.7)
Neutrophils Relative %: 69 %
Platelet Count: 160 K/uL (ref 150–400)
RBC: 4.2 MIL/uL (ref 3.87–5.11)
RDW: 21.5 % — ABNORMAL HIGH (ref 11.5–15.5)
WBC Count: 10 K/uL (ref 4.0–10.5)
nRBC: 0.3 % — ABNORMAL HIGH (ref 0.0–0.2)

## 2023-04-14 MED ORDER — PALONOSETRON HCL INJECTION 0.25 MG/5ML
0.2500 mg | Freq: Once | INTRAVENOUS | Status: AC
  Administered 2023-04-14: 0.25 mg via INTRAVENOUS
  Filled 2023-04-14: qty 5

## 2023-04-14 MED ORDER — ATROPINE SULFATE 1 MG/ML IV SOLN
0.5000 mg | Freq: Once | INTRAVENOUS | Status: AC | PRN
  Administered 2023-04-14: 0.5 mg via INTRAVENOUS
  Filled 2023-04-14: qty 1

## 2023-04-14 MED ORDER — FLUCONAZOLE 100 MG PO TABS: 100.0000 mg | ORAL_TABLET | Freq: Every day | ORAL | 0 refills | Status: DC

## 2023-04-14 MED ORDER — SODIUM CHLORIDE 0.9 % IV SOLN: Freq: Once | INTRAVENOUS | Status: AC

## 2023-04-14 MED ORDER — NYSTATIN 100000 UNIT/ML MT SUSP
5.0000 mL | Freq: Four times a day (QID) | OROMUCOSAL | 1 refills | Status: DC | PRN
Start: 2023-04-14 — End: 2024-06-04
  Filled 2023-04-14: qty 140, 7d supply, fill #0

## 2023-04-14 MED ORDER — SODIUM CHLORIDE 0.9 % IV SOLN
400.0000 mg/m2 | Freq: Once | INTRAVENOUS | Status: AC
  Administered 2023-04-14: 760 mg via INTRAVENOUS
  Filled 2023-04-14: qty 25

## 2023-04-14 MED ORDER — AMOXICILLIN-POT CLAVULANATE 875-125 MG PO TABS: 1.0000 | ORAL_TABLET | Freq: Two times a day (BID) | ORAL | 0 refills | Status: DC

## 2023-04-14 MED ORDER — SODIUM CHLORIDE 0.9 % IV SOLN
2000.0000 mg/m2 | INTRAVENOUS | Status: DC
  Administered 2023-04-14: 3500 mg via INTRAVENOUS
  Filled 2023-04-14: qty 70

## 2023-04-14 MED ORDER — SODIUM CHLORIDE 0.9% FLUSH
10.0000 mL | Freq: Once | INTRAVENOUS | Status: AC
  Administered 2023-04-14: 10 mL

## 2023-04-14 MED ORDER — SODIUM CHLORIDE 0.9 % IV SOLN
10.0000 mg | Freq: Once | INTRAVENOUS | Status: AC
  Administered 2023-04-14: 10 mg via INTRAVENOUS
  Filled 2023-04-14: qty 10

## 2023-04-14 MED ORDER — SODIUM CHLORIDE 0.9 % IV SOLN
180.0000 mg/m2 | Freq: Once | INTRAVENOUS | Status: AC
  Administered 2023-04-14: 340 mg via INTRAVENOUS
  Filled 2023-04-14: qty 2.26

## 2023-04-14 MED ORDER — SODIUM CHLORIDE 0.9 % IV SOLN
5.0000 mg/kg | Freq: Once | INTRAVENOUS | Status: AC
  Administered 2023-04-14: 400 mg via INTRAVENOUS
  Filled 2023-04-14: qty 16

## 2023-04-14 NOTE — Patient Instructions (Signed)
[place] [facility]  Discharge Instructions: Thank you for choosing [facility] to provide your oncology and hematology care.   If you have a lab appointment with the Cancer Center, please go directly to the Cancer Center and check in at the registration area.   Wear comfortable clothing and clothing appropriate for easy access to any Portacath or PICC line.   We strive to give you quality time with your provider. You may need to reschedule your appointment if you arrive late (15 or more minutes).  Arriving late affects you and other patients whose appointments are after yours.  Also, if you miss three or more appointments without notifying the office, you may be dismissed from the clinic at the provider's discretion.      For prescription refill requests, have your pharmacy contact our office and allow 72 hours for refills to be completed.    Today you received the following chemotherapy and/or immunotherapy agents: Bevacizumab, Oxaliplatin, & Fluorouracil      To help prevent nausea and vomiting after your treatment, we encourage you to take your nausea medication as directed.  BELOW ARE SYMPTOMS THAT SHOULD BE REPORTED IMMEDIATELY: *FEVER GREATER THAN 100.4 F (38 C) OR HIGHER *CHILLS OR SWEATING *NAUSEA AND VOMITING THAT IS NOT CONTROLLED WITH YOUR NAUSEA MEDICATION *UNUSUAL SHORTNESS OF BREATH *UNUSUAL BRUISING OR BLEEDING *URINARY PROBLEMS (pain or burning when urinating, or frequent urination) *BOWEL PROBLEMS (unusual diarrhea, constipation, pain near the anus) TENDERNESS IN MOUTH AND THROAT WITH OR WITHOUT PRESENCE OF ULCERS (sore throat, sores in mouth, or a toothache) UNUSUAL RASH, SWELLING OR PAIN  UNUSUAL VAGINAL DISCHARGE OR ITCHING   Items with * indicate a potential emergency and should be followed up as soon as possible or go to the Emergency Department if any problems should occur.  Please show the CHEMOTHERAPY ALERT CARD or  IMMUNOTHERAPY ALERT CARD at check-in to the Emergency Department and triage nurse.  Should you have questions after your visit or need to cancel or reschedule your appointment, please contact [place] [facility]  Dept: [phone]  and follow the prompts.  Office hours are 8:00 a.m. to 4:30 p.m. Monday - Friday. Please note that voicemails left after 4:00 p.m. may not be returned until the following business day.  We are closed weekends and major holidays. You have access to a nurse at all times for urgent questions. Please call the main number to the clinic Dept: [phone] and follow the prompts.   For any non-urgent questions, you may also contact your provider using MyChart. We now offer e-Visits for anyone 23 and older to request care online for non-urgent symptoms. For details visit [email or web address].   Also download the MyChart app! Go to the app store, search "MyChart", open the app, select , and log in with your MyChart username and password.  Bevacizumab Injection What is this medication? BEVACIZUMAB (be va SIZ yoo mab) treats some types of cancer. It works by blocking a protein that causes cancer cells to grow and multiply. This helps to slow or stop the spread of cancer cells. It is a monoclonal antibody. This medicine may be used for other purposes; ask your health care provider or pharmacist if you have questions. COMMON BRAND NAME(S): Alymsys, Avastin, MVASI, Omer Jack What should I tell my care team before I take this medication? They need to know if you have any of these conditions: Blood clots Coughing up blood Having or  recent surgery Heart failure High blood pressure History of a connection between 2 or more body parts that do not usually connect (fistula) History of a tear in your stomach or intestines Protein in your urine An unusual or allergic reaction to bevacizumab, other medications, foods, dyes, or  preservatives Pregnant or trying to get pregnant Breast-feeding How should I use this medication? This medication is injected into a vein. It is given by your care team in a hospital or clinic setting. Talk to your care team the use of this medication in children. Special care may be needed. Overdosage: If you think you have taken too much of this medicine contact a poison control center or emergency room at once. NOTE: This medicine is only for you. Do not share this medicine with others. What if I miss a dose? Keep appointments for follow-up doses. It is important not to miss your dose. Call your care team if you are unable to keep an appointment. What may interact with this medication? Interactions are not expected. This list may not describe all possible interactions. Give your health care provider a list of all the medicines, herbs, non-prescription drugs, or dietary supplements you use. Also tell them if you smoke, drink alcohol, or use illegal drugs. Some items may interact with your medicine. What should I watch for while using this medication? Your condition will be monitored carefully while you are receiving this medication. You may need blood work while taking this medication. This medication may make you feel generally unwell. This is not uncommon as chemotherapy can affect healthy cells as well as cancer cells. Report any side effects. Continue your course of treatment even though you feel ill unless your care team tells you to stop. This medication may increase your risk to bruise or bleed. Call your care team if you notice any unusual bleeding. Before having surgery, talk to your care team to make sure it is ok. This medication can increase the risk of poor healing of your surgical site or wound. You will need to stop this medication for 28 days before surgery. After surgery, wait at least 28 days before restarting this medication. Make sure the surgical site or wound is healed enough  before restarting this medication. Talk to your care team if questions. Talk to your care team if you may be pregnant. Serious birth defects can occur if you take this medication during pregnancy and for 6 months after the last dose. Contraception is recommended while taking this medication and for 6 months after the last dose. Your care team can help you find the option that works for you. Do not breastfeed while taking this medication and for 6 months after the last dose. This medication can cause infertility. Talk to your care team if you are concerned about your fertility. What side effects may I notice from receiving this medication? Side effects that you should report to your care team as soon as possible: Allergic reactions--skin rash, itching, hives, swelling of the face, lips, tongue, or throat Bleeding--bloody or black, tar-like stools, vomiting blood or brown material that looks like coffee grounds, red or dark brown urine, small red or purple spots on skin, unusual bruising or bleeding Blood clot--pain, swelling, or warmth in the leg, shortness of breath, chest pain Heart attack--pain or tightness in the chest, shoulders, arms, or jaw, nausea, shortness of breath, cold or clammy skin, feeling faint or lightheaded Heart failure--shortness of breath, swelling of the ankles, feet, or hands, sudden weight gain,  unusual weakness or fatigue Increase in blood pressure Infection--fever, chills, cough, sore throat, wounds that don't heal, pain or trouble when passing urine, general feeling of discomfort or being unwell Infusion reactions--chest pain, shortness of breath or trouble breathing, feeling faint or lightheaded Kidney injury--decrease in the amount of urine, swelling of the ankles, hands, or feet Stomach pain that is severe, does not go away, or gets worse Stroke--sudden numbness or weakness of the face, arm, or leg, trouble speaking, confusion, trouble walking, loss of balance or  coordination, dizziness, severe headache, change in vision Sudden and severe headache, confusion, change in vision, seizures, which may be signs of posterior reversible encephalopathy syndrome (PRES) Side effects that usually do not require medical attention (report to your care team if they continue or are bothersome): Back pain Change in taste Diarrhea Dry skin Increased tears Nosebleed This list may not describe all possible side effects. Call your doctor for medical advice about side effects. You may report side effects to FDA at 1-800-FDA-1088. Where should I keep my medication? This medication is given in a hospital or clinic. It will not be stored at home. NOTE: This sheet is a summary. It may not cover all possible information. If you have questions about this medicine, talk to your doctor, pharmacist, or health care provider.  2024 Elsevier/Gold Standard (2021-11-20 00:00:00)

## 2023-04-14 NOTE — Assessment & Plan Note (Signed)
[id number], stage IV,with oligo lung metastasis, intact MMR, KRAS G12V (+) -We reviewed her medical record and workup in detail with the patient and her daughter.  She presented with hematochezia since 05/2022 -Workup showed a near obstructing rectal mass. Path confirmed adenocarcinoma. Baseline CEA elevated to 18.1.  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  I recommend adding bevacizumab to her chemo -repeat restaging CT after cycle 5. May consider consolidation radiation to primary and the lung metastasis down the road if she has good response to treatment

## 2023-04-14 NOTE — Progress Notes (Signed)
[facility]   [phone] [phone]   Clinic Follow up Note   Patient Care Team: System, Provider Not In as PCP - General Gollan, Tollie Pizza, MD as PCP - Cardiology (Cardiology) Delma Freeze, FNP as Nurse Practitioner (Family Medicine) Mariah Milling, Tollie Pizza, MD as Consulting Physician (Cardiology) Merwyn Katos, MD (Inactive) as Consulting Physician (Pulmonary Disease) Jim Like, RN as Registered Nurse Scarlett Presto, RN (Inactive) as Registered Nurse Benita Gutter, RN as Oncology Nurse Navigator Malachy Mood, MD as Consulting Physician (Oncology) Pickenpack-Cousar, Arty Baumgartner, NP as Nurse Practitioner [facility] and Palliative [facility])  Date of Service:  04/14/2023  CHIEF COMPLAINT: f/u of metastatic rectal cancer  CURRENT THERAPY:  First-line chemotherapy FOLFOX and bevacizumab  Oncology History   Rectal cancer (HCC) cT3cN2M1, stage IV,with oligo lung metastasis, intact MMR, KRAS G12V (+) -We reviewed her medical record and workup in detail with the patient and her daughter.  She presented with hematochezia since 05/2022 -Workup showed a near obstructing rectal mass. Path confirmed adenocarcinoma. Baseline CEA elevated to 18.1.  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  I recommend adding bevacizumab to her chemo -repeat restaging CT after cycle 5. 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 Plan to do a scan after six cycles of treatment to assess response. Depending on  response, may consider radiation and possible surgery. -Continue chemotherapy, with dose reduction due to side effects. -Schedule scan for end of October.  Bronchitis/Sinus Infection Reports coughing and nasal drainage of thick, greenish-yellow mucus. No fever or chills. Patient is a smoker. -Prescribe Augmentin, to be taken twice daily for seven days. -Advise patient to quit smoking.  Oral Discomfort Reports discomfort in mouth, making it difficult to eat. No visible thrush or ulcers, but mouth is slightly red, especially at the back. -Prescribe magic mouthwash containing lidocaine, steroid, and nystatin. Use before meals to numb mouth. -Advise use of non-alcohol containing mouthwash or warm salt and baking soda rinse after meals and brushing.  Vaginal Yeast Infection Reports symptoms suggestive of a yeast infection. -Prescribe two doses of Fluconazole. Take one now, and the second if symptoms persist after finishing Augmentin.  Visual Disturbances Reports occasional blurriness and difficulty focusing. Likely related to chemotherapy. -Advise patient not to change glasses yet as vision should improve after completion of chemotherapy.  Plan and follow-up -Lab reviewed, adequate for treatment, will proceed to chemotherapy today with dose reduction due to mucositis  -next chemotherapy cycle scheduled for October 10 and October 24. If mouth issues persist, patient to call next week to potentially postpone next treatment if her mucositis gets worse.         SUMMARY OF ONCOLOGIC HISTORY: Oncology History Overview Note   Cancer Staging  Rectal cancer [facility]) Staging form: Colon and Rectum, AJCC 8th Edition - Clinical stage from 12/23/2022: Stage IVA (cT3, cN2, pM1a) - Signed by Rickard Patience, MD on 02/04/2023 Stage prefix: Initial diagnosis     Rectal cancer (HCC)  12/15/2022 Pathology Results   Colonoscopy:   Positive for invasive adenocarcinoma, G1 well differentiated  12/15/2022  Procedure   Colonoscopy under the care of Dr. Sharlet Salina   Findings: -The perianal and digital rectal examinations were normal. -Extensive amounts of liquid semi- liquid semi- solid stool was found in the entire colon -A partially obstructing large mass was found in the rectum. The mass was circumferential.   12/23/2022 Initial Diagnosis   Rectal cancer [facility]) Patient was seen by gastroenterology due to hematochezia as well as unintentional weight loss.  12/15/2022, colonoscopy showed fungating partially obstructing large mass in the rectum, circumferential, mass extended from about 15 cm from anus to about 8 cm from anal verge.  Biopsy was taken. Poor preparation of colon.  Colonoscopy was repeated on 12/16/2022, preparations inadequate.  Pathology showed invasive adenocarcinoma.   12/23/2022 Cancer Staging   Staging form: Colon and Rectum, AJCC 8th Edition - Clinical stage from 12/23/2022: Stage Unknown (cTX, cNX) - Signed by Rickard Patience, MD on 12/23/2022 Stage prefix: Initial diagnosis  Staging form: Colon and Rectum, AJCC 8th Edition - Clinical stage from 12/23/2022: Stage IVA (cT3, cN2, pM1a) - Signed by Rickard Patience, MD on 02/04/2023 Stage prefix: Initial diagnosis   12/23/2022 Tumor Marker   Patient's tumor was tested for the following markers: CEA. Results of the tumor marker test revealed elevated at 18.1.   01/03/2023 Imaging   CT chest abdomen with contrast showed 2.2 cm left upper lobe nodule, suspicious for metastasis. No findings suspicious for metastatic disease in the abdomen. Bilateral adrenal adenomas, benign. Cholelithiasis, without associated inflammatory changes. Aortic Atherosclerosis (ICD10-I70.0) and Emphysema   01/08/2023 Imaging   MRI pelvis without contrast-rectal protocol showed 9.2 cm concentric mid/lower rectal mass, corresponding to the patient's known primary rectal adenocarcinoma, as above. Rectal adenocarcinoma T stage: T3c Rectal adenocarcinoma N stage:  N2 Distance  from tumor to the internal anal sphincter is 3.4 cm.   01/27/2023 Procedure   Patient underwent biopsy via bronchoscopy by pulmonology. Biopsy pathology showed adenocarcinoma, Immunohistochemistry is positive with cytokeratin 20 and CDX2 and negative with cytokeratin 7, TTF-1 and napsin A consistent with colonic adenocarcinoma    01/27/2023 Pathology Results   CASE: [id number]   FINAL MICROSCOPIC DIAGNOSIS:  A. LUNG, LUL, FINE NEEDLE ASPIRATION:  Adenocarcinoma  See comment   ADDENDUM:  Immunohistochemistry is positive with cytokeratin 20 and CDX2  and negative with cytokeratin 7, TTF-1 and napsin A consistent with  colonic adenocarcinoma.    02/28/2023 Miscellaneous      03/03/2023 -  Chemotherapy   Patient is on Treatment Plan : COLORECTAL FOLFIRI q14d        Discussed the use of AI scribe software for clinical note transcription with the patient, who gave verbal consent to proceed.  History of Present Illness   The patient, a 57 year old female with metastatic erectile cancer currently on chemotherapy, presents with concerns of recurrent bronchitis and sinus infection. She describes coughing and blowing out 'nasty stuff' that is thick and greenish-yellow in color. The symptoms have been present for about a week. She also reports a smoking habit, which she has tried to quit without success.  In addition to the respiratory symptoms, the patient reports oral discomfort, which she initially suspected to be thrush. She describes clear patches in her mouth that cause significant pain, making it difficult to eat. She also reports a possible yeast infection. The patient has a history of rosacea, which is currently active. She also reports some vision changes, including blurriness and difficulty focusing, which she suspects may be related to her chemotherapy treatment.  The patient  also mentions a problem with her dentures, which have become too large due to significant weight loss. As a  result, she is currently on a soft diet and is having difficulty eating certain foods.         All other systems were reviewed with the patient and are negative.  MEDICAL HISTORY:  Past Medical History:  Diagnosis Date   (HFpEF) heart failure with preserved ejection fraction (HCC)    a. 05/2015 Echo: EF 60-65%, no rwma, PASP .   Acute pancreatitis 08/13/2018   Anxiety    Arthritis    Asthma    Bell's palsy    no deficit   Bronchitis    Cancer (HCC) 12/2022   rectal cancer   CHF (congestive heart failure) (HCC)    COPD (chronic obstructive pulmonary disease) (HCC)    Depression    Diabetes mellitus without complication (HCC)    type II 03/2017   Dyspnea    Fatty liver    per patient   Gall stones    per patient   Gastric ulcer    Hyperlipidemia    Hypertension    Lung nodule 12/2022   upper left   OSA (obstructive sleep apnea)    a. did not tolerate CPAP. On oxygen 2L via [place]   Pancreatitis    07/2018   Shoulder injury    6/19   Smoker     SURGICAL HISTORY: Past Surgical History:  Procedure Laterality Date   BRONCHIAL NEEDLE ASPIRATION BIOPSY  01/27/2023   Procedure: BRONCHIAL NEEDLE ASPIRATION BIOPSIES;  Surgeon: Raechel Chute, MD;  Location: [facility] ENDOSCOPY;  Service: Pulmonary;;   COLONOSCOPY WITH PROPOFOL N/A 12/15/2022   Procedure: COLONOSCOPY WITH PROPOFOL;  Surgeon: Wyline Mood, MD;  Location: [facility] ENDOSCOPY;  Service: Gastroenterology;  Laterality: N/A;   COLONOSCOPY WITH PROPOFOL N/A 12/16/2022   Procedure: COLONOSCOPY WITH PROPOFOL;  Surgeon: Wyline Mood, MD;  Location: [facility] ENDOSCOPY;  Service: Gastroenterology;  Laterality: N/A;   FLEXIBLE BRONCHOSCOPY N/A 06/20/2015   Procedure: FLEXIBLE BRONCHOSCOPY;  Surgeon: Shane Crutch, MD;  Location: [facility] ORS;  Service: Pulmonary;  Laterality: N/A;   IR IMAGING GUIDED PORT INSERTION  02/25/2023   KNEE SURGERY Left    8 knee surgeries    I have reviewed the social history and family history with the  patient and they are unchanged from previous note.  ALLERGIES:  is allergic to tramadol and penicillins.  MEDICATIONS:  Current Outpatient Medications  Medication Sig Dispense Refill   amoxicillin-clavulanate (AUGMENTIN) 875-125 MG tablet Take 1 tablet by mouth 2 (two) times daily. 14 tablet 0   fluconazole (DIFLUCAN) 100 MG tablet Take 1 tablet (100 mg total) by mouth daily. 2 tablet 0   albuterol (VENTOLIN HFA) 108 (90 Base) MCG/ACT inhaler Inhale 2 puffs into the lungs every 6 (six) hours as needed for wheezing or shortness of breath.      Blood Glucose Monitoring Suppl (ACCU-CHEK AVIVA PLUS) w/Device KIT See admin instructions.  0   busPIRone (BUSPAR) 30 MG tablet Take 30 mg by mouth 2 (two) times daily. PER PT STATES HER DOSE WAS INCREASED     clonazePAM (KLONOPIN) 0.5 MG tablet Take 0.5 mg by mouth at bedtime as needed for anxiety.     diltiazem (CARDIZEM CD) 120 MG 24 hr capsule TAKE 1 CAPSULE BY MOUTH EVERY DAY 60 capsule 1   diltiazem (CARDIZEM) 30 MG tablet TAKE 1 TABLET(30 MG) BY MOUTH THREE TIMES DAILY AS NEEDED 270 tablet 0   DULoxetine (CYMBALTA)  60 MG capsule Take 60 mg by mouth 2 (two) times daily.      ezetimibe (ZETIA) 10 MG tablet Take 10 mg by mouth at bedtime.     Fluticasone-Salmeterol (ADVAIR) 250-50 MCG/DOSE AEPB Inhale 1 puff into the lungs 2 (two) times daily.     furosemide (LASIX) 40 MG tablet TAKE 1 TABLET BY MOUTH EVERY DAY. MAY TAKE ADDITIONAL TABLET IN THE AM FOR WEIGHT GAIN AS NEEDED 110 tablet 2   JARDIANCE 25 MG TABS tablet Take 25 mg by mouth daily.     lidocaine-prilocaine (EMLA) cream Apply to affected area once 30 g 3   magic mouthwash (nystatin, lidocaine, diphenhydrAMINE, alum & mag hydroxide) suspension Take 5 mLs by mouth 4 (four) times daily as needed for mouth pain. 140 mL 1   metFORMIN (GLUCOPHAGE-XR) 500 MG 24 hr tablet Take 1,000 mg by mouth 2 (two) times daily.     ondansetron (ZOFRAN) 8 MG tablet Take 1 tablet (8 mg total) by mouth every 8  (eight) hours as needed for nausea, vomiting or refractory nausea / vomiting. Start on the third day after chemotherapy. 30 tablet 1   Oxycodone HCl 10 MG TABS Take 1 tablet (10 mg total) by mouth 3 (three) times daily as needed. 90 tablet 0   OXYGEN Inhale 2 L into the lungs at bedtime. Or as needed throughout the day if short of breath     pantoprazole (PROTONIX) 40 MG tablet Take 1 tablet (40 mg total) by mouth daily. 30 tablet 0   potassium chloride SA (KLOR-CON) 20 MEQ tablet Take 20 mEq by mouth 2 (two) times daily.     pregabalin (LYRICA) 150 MG capsule Take 1 capsule (150 mg total) by mouth 3 (three) times daily. 90 capsule 0   prochlorperazine (COMPAZINE) 10 MG tablet Take 1 tablet (10 mg total) by mouth every 6 (six) hours as needed for nausea or vomiting. 30 tablet 1   spironolactone (ALDACTONE) 25 MG tablet TAKE 1 TABLET(25 MG) BY MOUTH EVERY MORNING 90 tablet 0   sucralfate (CARAFATE) 1 G tablet Take 1 tablet (1 g total) by mouth 4 (four) times daily -  with meals and at bedtime. (Patient taking differently: Take 1 g by mouth 2 (two) times daily.) 120 tablet 0   tiotropium (SPIRIVA) 18 MCG inhalation capsule Place 18 mcg into inhaler and inhale daily.     tiZANidine (ZANAFLEX) 4 MG tablet Take 8 mg by mouth 3 (three) times daily.     Varenicline Tartrate, Starter, 0.5 MG X 11 & 1 MG X 42 TBPK See admin instructions. follow package directions     zolpidem (AMBIEN) 10 MG tablet Take 10 mg by mouth at bedtime as needed for sleep.     No current facility-administered medications for this visit.   Facility-Administered Medications Ordered in Other Visits  Medication Dose Route Frequency Provider Last Rate Last Admin   fluorouracil (ADRUCIL) 3,500 mg in sodium chloride 0.9 % 80 mL chemo infusion  2,000 mg/m2 (Treatment Plan Recorded) Intravenous 1 day or 1 dose Malachy Mood, MD   Infusion Verify at 04/14/23 1600    PHYSICAL EXAMINATION: ECOG PERFORMANCE STATUS: 1 - Symptomatic but  completely ambulatory  Vitals:   04/14/23 1219  BP: 127/64  Pulse: 87  Resp: 18  Temp: 98.3 F (36.8 C)  SpO2: 95%   Wt Readings from Last 3 Encounters:  04/14/23 170 lb 12.8 oz (77.5 kg)  03/31/23 170 lb 9.6 oz (77.4 kg)  03/31/23 172  lb (78 kg)     GENERAL:alert, no distress and comfortable SKIN: skin color, texture, turgor are normal, no rashes or significant lesions EYES: normal, Conjunctiva are pink and non-injected, sclera clear Oral: No oral thrush or ulcers, (+) mucosal congestion in the back of throat NECK: supple, thyroid normal size, non-tender, without nodularity LYMPH:  no palpable lymphadenopathy in the cervical, axillary  LUNGS: clear to auscultation and percussion with normal breathing effort HEART: regular rate & rhythm and no murmurs and no lower extremity edema ABDOMEN:abdomen soft, non-tender and normal bowel sounds Musculoskeletal:no cyanosis of digits and no clubbing  NEURO: alert & oriented x 3 with fluent speech, no focal motor/sensory deficits   LABORATORY DATA:  I have reviewed the data as listed    Latest Ref Rng & Units 04/14/2023   11:41 AM 03/31/2023   11:19 AM 03/17/2023   10:58 AM  CBC  WBC 4.0 - 10.5 K/uL 10.0  3.8  4.3   Hemoglobin 12.0 - 15.0 g/dL 13.0  86.5  78.4   Hematocrit 36.0 - 46.0 % 39.1  40.4  35.6   Platelets 150 - 400 K/uL 160  228  247         Latest Ref Rng & Units 04/14/2023   11:41 AM 03/31/2023   11:19 AM 03/17/2023   10:58 AM  CMP  Glucose 70 - 99 mg/dL 696  295  284   BUN 6 - 20 mg/dL 10  8  13    Creatinine 0.44 - 1.00 mg/dL 1.32  4.40  1.02   Sodium 135 - 145 mmol/L 135  136  137   Potassium 3.5 - 5.1 mmol/L 3.8  4.5  4.4   Chloride 98 - 111 mmol/L 97  101  104   CO2 22 - 32 mmol/L 27  26  26    Calcium 8.9 - 10.3 mg/dL 9.1  9.8  8.7   Total Protein 6.5 - 8.1 g/dL 6.9  7.4  6.6   Total Bilirubin 0.3 - 1.2 mg/dL 0.4  0.5  0.4   Alkaline Phos 38 - 126 U/L 99  85  74   AST 15 - 41 U/L 15  13  12    ALT 0 - 44  U/L 18  20  13        RADIOGRAPHIC STUDIES: I have personally reviewed the radiological images as listed and agreed with the findings in the report. No results found.    Orders Placed This Encounter  Procedures   CBC with Differential (Cancer Center Only)    Standing Status:   Future    Standing Expiration Date:   05/11/2024   CMP (Cancer Center only)    Standing Status:   Future    Standing Expiration Date:   05/11/2024   All questions were answered. The patient knows to call the clinic with any problems, questions or concerns. No barriers to learning was detected. The total time spent in the appointment was 25 minutes.     Malachy Mood, MD 04/14/2023

## 2023-04-15 ENCOUNTER — Other Ambulatory Visit: Payer: Self-pay

## 2023-04-16 ENCOUNTER — Inpatient Hospital Stay: Payer: Medicaid Other

## 2023-04-16 VITALS — BP 188/73 | HR 120 | Temp 97.7°F | Resp 17

## 2023-04-16 DIAGNOSIS — C20 Malignant neoplasm of rectum: Secondary | ICD-10-CM

## 2023-04-16 DIAGNOSIS — Z5111 Encounter for antineoplastic chemotherapy: Secondary | ICD-10-CM | POA: Diagnosis not present

## 2023-04-16 MED ORDER — PEGFILGRASTIM-CBQV 6 MG/0.6ML ~~LOC~~ SOSY
6.0000 mg | PREFILLED_SYRINGE | Freq: Once | SUBCUTANEOUS | Status: AC
  Administered 2023-04-16: 6 mg via SUBCUTANEOUS

## 2023-04-16 MED ORDER — SODIUM CHLORIDE 0.9% FLUSH
10.0000 mL | INTRAVENOUS | Status: DC | PRN
  Administered 2023-04-16: 10 mL

## 2023-04-16 MED ORDER — HEPARIN SOD (PORK) LOCK FLUSH 100 UNIT/ML IV SOLN
500.0000 [IU] | Freq: Once | INTRAVENOUS | Status: AC | PRN
  Administered 2023-04-16: 500 [IU]

## 2023-04-18 ENCOUNTER — Other Ambulatory Visit: Payer: Self-pay

## 2023-04-22 ENCOUNTER — Other Ambulatory Visit: Payer: Self-pay | Admitting: Cardiovascular Disease

## 2023-04-26 NOTE — Progress Notes (Unsigned)
Palliative Medicine [facility]  [phone] [phone]   Name: Erica [name] Date: 04/26/2023 MRN: [id number]  DOB: 05/22/1966  Patient Care Team: System, Provider Not In as PCP - General Gollan, Tollie Pizza, MD as PCP - Cardiology (Cardiology) Delma Freeze, FNP as Nurse Practitioner (Family Medicine) Antonieta Iba, MD as Consulting Physician (Cardiology) Merwyn Katos, MD (Inactive) as Consulting Physician (Pulmonary Disease) Jim Like, RN as Registered Nurse Scarlett Presto, RN (Inactive) as Registered Nurse Benita Gutter, RN as Oncology Nurse Navigator Malachy Mood, MD as Consulting Physician (Oncology) Pickenpack-Cousar, Arty Baumgartner, NP as Nurse Practitioner (Hospice and Palliative Medicine)    INTERVAL HISTORY: Erica [name] is a 57 y.o. 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 Palliative ask to see for symptom management and goals of care.   SOCIAL HISTORY:     reports that she has been smoking cigarettes. She has a 138 pack-year smoking history. She has never used smokeless tobacco. She reports current alcohol use. She reports that she does not use drugs.  ADVANCE DIRECTIVES:  None on file  CODE STATUS: Full code  PAST MEDICAL HISTORY: Past Medical History:  Diagnosis Date   (HFpEF) heart failure with preserved ejection fraction (HCC)    a. 05/2015 Echo: EF 60-65%, no rwma, PASP .   Acute pancreatitis 08/13/2018   Anxiety    Arthritis    Asthma    Bell's palsy    no deficit   Bronchitis    Cancer (HCC) 12/2022   rectal cancer   CHF (congestive heart failure) (HCC)    COPD (chronic obstructive pulmonary disease) (HCC)    Depression    Diabetes mellitus without complication (HCC)    type II 03/2017   Dyspnea    Fatty liver    per patient   Gall stones    per patient   Gastric  ulcer    Hyperlipidemia    Hypertension    Lung nodule 12/2022   upper left   OSA (obstructive sleep apnea)    a. did not tolerate CPAP. On oxygen 2L via [place]   Pancreatitis    07/2018   Shoulder injury    6/19   Smoker     ALLERGIES:  is allergic to tramadol and penicillins.  MEDICATIONS:  Current Outpatient Medications  Medication Sig Dispense Refill   albuterol (VENTOLIN HFA) 108 (90 Base) MCG/ACT inhaler Inhale 2 puffs into the lungs every 6 (six) hours as needed for wheezing or shortness of breath.      amoxicillin-clavulanate (AUGMENTIN) 875-125 MG tablet Take 1 tablet by mouth 2 (two) times daily. 14 tablet 0   Blood Glucose Monitoring Suppl (ACCU-CHEK AVIVA PLUS) w/Device KIT See admin instructions.  0   busPIRone (BUSPAR) 30 MG tablet Take 30 mg by mouth 2 (two) times daily. PER PT STATES HER DOSE WAS INCREASED     clonazePAM (KLONOPIN) 0.5 MG tablet Take 0.5 mg by mouth at bedtime as needed for anxiety.     diltiazem (CARDIZEM CD) 120 MG 24 hr capsule TAKE 1 CAPSULE BY MOUTH EVERY DAY 180 capsule 2   diltiazem (CARDIZEM) 30 MG tablet TAKE 1 TABLET(30 MG) BY MOUTH THREE TIMES DAILY AS NEEDED 270 tablet 0   DULoxetine (CYMBALTA) 60 MG capsule Take 60 mg by mouth 2 (two) times daily.      ezetimibe (ZETIA) 10 MG tablet Take  10 mg by mouth at bedtime.     fluconazole (DIFLUCAN) 100 MG tablet Take 1 tablet (100 mg total) by mouth daily. 2 tablet 0   Fluticasone-Salmeterol (ADVAIR) 250-50 MCG/DOSE AEPB Inhale 1 puff into the lungs 2 (two) times daily.     furosemide (LASIX) 40 MG tablet TAKE 1 TABLET BY MOUTH EVERY DAY. MAY TAKE ADDITIONAL TABLET IN THE AM FOR WEIGHT GAIN AS NEEDED 110 tablet 2   JARDIANCE 25 MG TABS tablet Take 25 mg by mouth daily.     lidocaine-prilocaine (EMLA) cream Apply to affected area once 30 g 3   magic mouthwash (nystatin, lidocaine, diphenhydrAMINE, alum & mag hydroxide) suspension Take 5 mLs by mouth 4 (four) times daily as needed for mouth pain. 140 mL  1   metFORMIN (GLUCOPHAGE-XR) 500 MG 24 hr tablet Take 1,000 mg by mouth 2 (two) times daily.     ondansetron (ZOFRAN) 8 MG tablet Take 1 tablet (8 mg total) by mouth every 8 (eight) hours as needed for nausea, vomiting or refractory nausea / vomiting. Start on the third day after chemotherapy. 30 tablet 1   Oxycodone HCl 10 MG TABS Take 1 tablet (10 mg total) by mouth 3 (three) times daily as needed. 90 tablet 0   OXYGEN Inhale 2 L into the lungs at bedtime. Or as needed throughout the day if short of breath     pantoprazole (PROTONIX) 40 MG tablet Take 1 tablet (40 mg total) by mouth daily. 30 tablet 0   potassium chloride SA (KLOR-CON) 20 MEQ tablet Take 20 mEq by mouth 2 (two) times daily.     pregabalin (LYRICA) 150 MG capsule Take 1 capsule (150 mg total) by mouth 3 (three) times daily. 90 capsule 0   prochlorperazine (COMPAZINE) 10 MG tablet Take 1 tablet (10 mg total) by mouth every 6 (six) hours as needed for nausea or vomiting. 30 tablet 1   spironolactone (ALDACTONE) 25 MG tablet TAKE 1 TABLET(25 MG) BY MOUTH EVERY MORNING 90 tablet 0   sucralfate (CARAFATE) 1 G tablet Take 1 tablet (1 g total) by mouth 4 (four) times daily -  with meals and at bedtime. (Patient taking differently: Take 1 g by mouth 2 (two) times daily.) 120 tablet 0   tiotropium (SPIRIVA) 18 MCG inhalation capsule Place 18 mcg into inhaler and inhale daily.     tiZANidine (ZANAFLEX) 4 MG tablet Take 8 mg by mouth 3 (three) times daily.     Varenicline Tartrate, Starter, 0.5 MG X 11 & 1 MG X 42 TBPK See admin instructions. follow package directions     zolpidem (AMBIEN) 10 MG tablet Take 10 mg by mouth at bedtime as needed for sleep.     No current facility-administered medications for this visit.    VITAL SIGNS: LMP 03/13/2015 (Approximate) Comment: more than 2 years ago There were no vitals filed for this visit.  Estimated body mass index is 25.97 kg/m as calculated from the following:   Height as of 04/14/23: 5'  8" (1.727 m).   Weight as of 04/14/23: 170 lb 12.8 oz (77.5 kg).   PERFORMANCE STATUS (ECOG) : 1 - Symptomatic but completely ambulatory   Physical Exam General: NAD Cardiovascular: regular rate and rhythm Pulmonary: clear ant fields Abdomen: soft, nontender, + bowel sounds Extremities: no edema, no joint deformities Skin: no rashes Neurological:   IMPRESSION: Erica [name] presented to clinic today for follow-up. No acute distress. Her daughter is present.  Patient states she is doing  well overall.  Denies nausea, vomiting, constipation.  Appetite fluctuates.  Some days are better than others.  Shares upcoming plans to visit [place] for Bike week and spread her late husband's ashes as he enjoyed motorcycling.  Chronic pain/neoplasm related pain/neuropathic pain Erica [name] reports her pain is currently well-controlled on current regimen.  Tolerating without difficulty.     We reviewed her current pain regimen.  She is currently taking Lyrica 150 mg 3 times daily and oxycodone 10 mg every 8 hours as needed for pain.  Given pain continues to be well-controlled on current regimen no changes at this time.  Patient is taking medications appropriately as identified based on refill request and pill counts.    We will continue to closely monitor and support.  Pain contract on file.   2. Constipation Education provided on the importance of bowel regimen in the setting of opioid use.   Goals of care  03/31/23- We discussed her current illness and what it means in the larger context of her on-going co-morbidities. Natural disease trajectory and expectations were discussed.   Erica [name] is realistic in her understanding.  Verbalize goal is to continue to treat the treatable allow her every opportunity to continue to thrive.  Is taking things oneday at a time.  We discussed Her current illness and what it means in the larger context of Her on-going co-morbidities. Natural disease trajectory and  expectations were discussed.  I discussed the importance of continued conversation with family and their medical providers regarding overall plan of care and treatment options, ensuring decisions are within the context of the patients values and GOCs.  PLAN: Oxycodone 10 mg every 8 hours as needed. Lyrica 150 mg 3 times daily Pain contract on file. MiraLAX twice daily for bowel regimen Ongoing symptom management support. I will plan to see patient back in 3-4 weeks in collaboration to other oncology appointments.   Patient expressed understanding and was in agreement with this plan. She also understands that She can call the clinic at any time with any questions, concerns, or complaints.   Any controlled substances utilized were prescribed in the context of palliative care. PDMP has been reviewed.    Visit consisted of counseling and education dealing with the complex and emotionally intense issues of symptom management and palliative care in the setting of serious and potentially life-threatening illness.  Willette Alma, AGPCNP-BC  Palliative Medicine Team/[facility]  *Please note that this is a verbal dictation therefore any spelling or grammatical errors are due to the "[employer]" system interpretation.

## 2023-04-27 MED FILL — Dexamethasone Sodium Phosphate Inj 100 MG/10ML: INTRAMUSCULAR | Qty: 1 | Status: AC

## 2023-04-28 ENCOUNTER — Inpatient Hospital Stay: Payer: Medicaid Other | Attending: Oncology

## 2023-04-28 ENCOUNTER — Inpatient Hospital Stay (HOSPITAL_BASED_OUTPATIENT_CLINIC_OR_DEPARTMENT_OTHER): Payer: Medicaid Other | Admitting: Nurse Practitioner

## 2023-04-28 ENCOUNTER — Encounter: Payer: Self-pay | Admitting: Hematology

## 2023-04-28 ENCOUNTER — Inpatient Hospital Stay: Payer: Medicaid Other

## 2023-04-28 ENCOUNTER — Encounter: Payer: Self-pay | Admitting: Nurse Practitioner

## 2023-04-28 ENCOUNTER — Inpatient Hospital Stay (HOSPITAL_BASED_OUTPATIENT_CLINIC_OR_DEPARTMENT_OTHER): Payer: Medicaid Other | Admitting: Hematology

## 2023-04-28 VITALS — BP 126/58 | HR 93 | Temp 97.4°F | Resp 16 | Ht 68.0 in | Wt 168.6 lb

## 2023-04-28 DIAGNOSIS — Z515 Encounter for palliative care: Secondary | ICD-10-CM | POA: Insufficient documentation

## 2023-04-28 DIAGNOSIS — C20 Malignant neoplasm of rectum: Secondary | ICD-10-CM

## 2023-04-28 DIAGNOSIS — Z5111 Encounter for antineoplastic chemotherapy: Secondary | ICD-10-CM | POA: Diagnosis present

## 2023-04-28 DIAGNOSIS — K5903 Drug induced constipation: Secondary | ICD-10-CM | POA: Diagnosis not present

## 2023-04-28 DIAGNOSIS — Z452 Encounter for adjustment and management of vascular access device: Secondary | ICD-10-CM | POA: Diagnosis not present

## 2023-04-28 DIAGNOSIS — C7802 Secondary malignant neoplasm of left lung: Secondary | ICD-10-CM | POA: Diagnosis not present

## 2023-04-28 DIAGNOSIS — Z5112 Encounter for antineoplastic immunotherapy: Secondary | ICD-10-CM | POA: Insufficient documentation

## 2023-04-28 DIAGNOSIS — Z95828 Presence of other vascular implants and grafts: Secondary | ICD-10-CM

## 2023-04-28 DIAGNOSIS — G893 Neoplasm related pain (acute) (chronic): Secondary | ICD-10-CM

## 2023-04-28 DIAGNOSIS — Z5189 Encounter for other specified aftercare: Secondary | ICD-10-CM | POA: Insufficient documentation

## 2023-04-28 LAB — CBC WITH DIFFERENTIAL (CANCER CENTER ONLY)
Abs Immature Granulocytes: 0.11 10*3/uL — ABNORMAL HIGH (ref 0.00–0.07)
Basophils Absolute: 0.1 10*3/uL (ref 0.0–0.1)
Basophils Relative: 1 %
Eosinophils Absolute: 0.5 10*3/uL (ref 0.0–0.5)
Eosinophils Relative: 5 %
HCT: 42.7 % (ref 36.0–46.0)
Hemoglobin: 13.6 g/dL (ref 12.0–15.0)
Immature Granulocytes: 1 %
Lymphocytes Relative: 16 %
Lymphs Abs: 1.7 10*3/uL (ref 0.7–4.0)
MCH: 30 pg (ref 26.0–34.0)
MCHC: 31.9 g/dL (ref 30.0–36.0)
MCV: 94.3 fL (ref 80.0–100.0)
Monocytes Absolute: 0.7 10*3/uL (ref 0.1–1.0)
Monocytes Relative: 6 %
Neutro Abs: 7.7 10*3/uL (ref 1.7–7.7)
Neutrophils Relative %: 71 %
Platelet Count: 210 10*3/uL (ref 150–400)
RBC: 4.53 MIL/uL (ref 3.87–5.11)
RDW: 22.5 % — ABNORMAL HIGH (ref 11.5–15.5)
WBC Count: 10.8 10*3/uL — ABNORMAL HIGH (ref 4.0–10.5)
nRBC: 0 % (ref 0.0–0.2)

## 2023-04-28 LAB — CMP (CANCER CENTER ONLY)
ALT: 18 U/L (ref 0–44)
AST: 14 U/L — ABNORMAL LOW (ref 15–41)
Albumin: 3.9 g/dL (ref 3.5–5.0)
Alkaline Phosphatase: 123 U/L (ref 38–126)
Anion gap: 8 (ref 5–15)
BUN: 6 mg/dL (ref 6–20)
CO2: 24 mmol/L (ref 22–32)
Calcium: 9.3 mg/dL (ref 8.9–10.3)
Chloride: 105 mmol/L (ref 98–111)
Creatinine: 0.71 mg/dL (ref 0.44–1.00)
GFR, Estimated: >60 mL/min (ref >60)
Glucose, Bld: 201 mg/dL — ABNORMAL HIGH (ref 70–99)
Potassium: 3.6 mmol/L (ref 3.5–5.1)
Sodium: 137 mmol/L (ref 135–145)
Total Bilirubin: 0.4 mg/dL (ref 0.3–1.2)
Total Protein: 7.1 g/dL (ref 6.5–8.1)

## 2023-04-28 LAB — TOTAL PROTEIN, URINE DIPSTICK: Protein, ur: NEGATIVE mg/dL

## 2023-04-28 LAB — CEA (IN HOUSE-CHCC): CEA (CHCC-In House): 8.07 ng/mL — ABNORMAL HIGH (ref 0.00–5.00)

## 2023-04-28 MED ORDER — SODIUM CHLORIDE 0.9 % IV SOLN
10.0000 mg | Freq: Once | INTRAVENOUS | Status: AC
  Administered 2023-04-28: 10 mg via INTRAVENOUS
  Filled 2023-04-28: qty 10

## 2023-04-28 MED ORDER — SODIUM CHLORIDE 0.9 % IV SOLN
5.0000 mg/kg | Freq: Once | INTRAVENOUS | Status: AC
  Administered 2023-04-28: 400 mg via INTRAVENOUS
  Filled 2023-04-28: qty 16

## 2023-04-28 MED ORDER — OXYCODONE HCL 10 MG PO TABS: 10.0000 mg | ORAL_TABLET | Freq: Three times a day (TID) | ORAL | 0 refills | Status: DC | PRN

## 2023-04-28 MED ORDER — SODIUM CHLORIDE 0.9 % IV SOLN
400.0000 mg/m2 | Freq: Once | INTRAVENOUS | Status: AC
  Administered 2023-04-28: 760 mg via INTRAVENOUS
  Filled 2023-04-28: qty 25

## 2023-04-28 MED ORDER — ATROPINE SULFATE 1 MG/ML IV SOLN
0.5000 mg | Freq: Once | INTRAVENOUS | Status: AC | PRN
  Administered 2023-04-28: 0.5 mg via INTRAVENOUS
  Filled 2023-04-28: qty 1

## 2023-04-28 MED ORDER — HEPARIN SOD (PORK) LOCK FLUSH 100 UNIT/ML IV SOLN: 500.0000 [IU] | Freq: Once | INTRAVENOUS | Status: DC | PRN

## 2023-04-28 MED ORDER — SODIUM CHLORIDE 0.9 % IV SOLN
180.0000 mg/m2 | Freq: Once | INTRAVENOUS | Status: AC
  Administered 2023-04-28: 340 mg via INTRAVENOUS
  Filled 2023-04-28: qty 17

## 2023-04-28 MED ORDER — PALONOSETRON HCL INJECTION 0.25 MG/5ML
0.2500 mg | Freq: Once | INTRAVENOUS | Status: AC
  Administered 2023-04-28: 0.25 mg via INTRAVENOUS
  Filled 2023-04-28: qty 5

## 2023-04-28 MED ORDER — SODIUM CHLORIDE 0.9 % IV SOLN
2200.0000 mg/m2 | INTRAVENOUS | Status: DC
  Administered 2023-04-28: 4200 mg via INTRAVENOUS
  Filled 2023-04-28: qty 84

## 2023-04-28 MED ORDER — SODIUM CHLORIDE 0.9% FLUSH
10.0000 mL | Freq: Once | INTRAVENOUS | Status: AC
  Administered 2023-04-28: 10 mL

## 2023-04-28 MED ORDER — SODIUM CHLORIDE 0.9 % IV SOLN: Freq: Once | INTRAVENOUS | Status: AC

## 2023-04-28 MED ORDER — SODIUM CHLORIDE 0.9% FLUSH: 10.0000 mL | INTRAVENOUS | Status: DC | PRN

## 2023-04-28 NOTE — Progress Notes (Signed)
[facility]   Telephone:([phone]) [phone] Fax:([phone]   Clinic Follow up Note   Patient Care Team: System, Provider Not In as PCP - General Gollan, Tollie Pizza, MD as PCP - Cardiology (Cardiology) Delma Freeze, FNP as Nurse Practitioner (Family Medicine) Mariah Milling, Tollie Pizza, MD as Consulting Physician (Cardiology) Merwyn Katos, MD (Inactive) as Consulting Physician (Pulmonary Disease) Jim Like, RN as Registered Nurse Scarlett Presto, RN (Inactive) as Registered Nurse Benita Gutter, RN as Oncology Nurse Navigator Malachy Mood, MD as Consulting Physician (Oncology) Pickenpack-Cousar, Arty Baumgartner, NP as Nurse Practitioner [facility]iative [facility])  Date of Service:  04/28/2023  CHIEF COMPLAINT: f/u of rectal cancer  CURRENT THERAPY:  First-line chemotherapy FOLFOX  Oncology History   Rectal cancer (HCC) cT3cN2M1, stage IV,with oligo lung metastasis, intact MMR, KRAS G12V (+) -We reviewed her medical record and workup in detail with the patient and her daughter.  She presented with hematochezia since 05/2022 -Workup showed a near obstructing rectal mass. Path confirmed adenocarcinoma. Baseline CEA elevated to 18.1.  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  I added bevacizumab to her chemo from cycle 3 -repeat restaging CT after cycle 5. May consider consolidation radiation to primary and the lung metastasis down the road if she has good response to treatment    Assessment and Plan    Metastatic Colorectal Cancer Patient is midway through chemotherapy treatment. Experiencing diarrhea for a few days post-chemo and fatigue. No other adverse  effects reported. Tumor marker previously elevated (CEA 18). -Order CT scan of chest, abdomen, and pelvis in the next 2-4 weeks to assess response to treatment. -Continue current chemotherapy regimen. -Advise patient to use Imodium for post-chemo diarrhea and to pause stool softeners during this period. -If she has excellent response to chemotherapy, will consider concurrent chemoradiation to her primary rectal cancer, and SBRT to her lung mets    Plan -Lab reviewed, adequate for treatment, will proceed cycle 5 FOLFOX today -I ordered a CT chest, abdomen pelvis to be done in the next 2 to 3 weeks -f/u in 2 weeks before C6   SUMMARY OF ONCOLOGIC HISTORY: Oncology History Overview Note   Cancer Staging  Rectal cancer [facility]) Staging form: Colon and Rectum, AJCC 8th Edition - Clinical stage from 12/23/2022: Stage IVA (cT3, cN2, pM1a) - Signed by Rickard Patience, MD on 02/04/2023 Stage prefix: Initial diagnosis     Rectal cancer (HCC)  12/15/2022 Pathology Results   Colonoscopy:   Positive for invasive adenocarcinoma, G1 well differentiated      12/15/2022 Procedure   Colonoscopy under the care of Dr. Sharlet Salina   Findings: -The perianal and digital rectal examinations were normal. -Extensive amounts of liquid semi- liquid semi- solid stool was found in the entire colon -A partially obstructing large mass was found in the rectum. The mass was circumferential.   12/23/2022 Initial Diagnosis   Rectal cancer [facility]) Patient was seen by gastroenterology due to hematochezia as well as unintentional weight loss.  12/15/2022, colonoscopy showed fungating partially obstructing large mass in the rectum, circumferential, mass extended from about 15 cm from anus to about 8 cm from anal verge.  Biopsy was taken. Poor preparation of colon.  Colonoscopy was repeated  on 12/16/2022, preparations inadequate.  Pathology showed invasive adenocarcinoma.   12/23/2022 Cancer Staging   Staging form: Colon and Rectum,  AJCC 8th Edition - Clinical stage from 12/23/2022: Stage Unknown (cTX, cNX) - Signed by Rickard Patience, MD on 12/23/2022 Stage prefix: Initial diagnosis  Staging form: Colon and Rectum, AJCC 8th Edition - Clinical stage from 12/23/2022: Stage IVA (cT3, cN2, pM1a) - Signed by Rickard Patience, MD on 02/04/2023 Stage prefix: Initial diagnosis   12/23/2022 Tumor Marker   Patient's tumor was tested for the following markers: CEA. Results of the tumor marker test revealed elevated at 18.1.   01/03/2023 Imaging   CT chest abdomen with contrast showed 2.2 cm left upper lobe nodule, suspicious for metastasis. No findings suspicious for metastatic disease in the abdomen. Bilateral adrenal adenomas, benign. Cholelithiasis, without associated inflammatory changes. Aortic Atherosclerosis (ICD10-I70.0) and Emphysema   01/08/2023 Imaging   MRI pelvis without contrast-rectal protocol showed 9.2 cm concentric mid/lower rectal mass, corresponding to the patient's known primary rectal adenocarcinoma, as above. Rectal adenocarcinoma T stage: T3c Rectal adenocarcinoma N stage:  N2 Distance from tumor to the internal anal sphincter is 3.4 cm.   01/27/2023 Procedure   Patient underwent biopsy via bronchoscopy by pulmonology. Biopsy pathology showed adenocarcinoma, Immunohistochemistry is positive with cytokeratin 20 and CDX2 and negative with cytokeratin 7, TTF-1 and napsin A consistent with colonic adenocarcinoma    01/27/2023 Pathology Results   CASE: [phone]   FINAL MICROSCOPIC DIAGNOSIS:  A. LUNG, LUL, FINE NEEDLE ASPIRATION:  Adenocarcinoma  See comment   ADDENDUM:  Immunohistochemistry is positive with cytokeratin 20 and CDX2  and negative with cytokeratin 7, TTF-1 and napsin A consistent with  colonic adenocarcinoma.    02/28/2023 Miscellaneous      03/03/2023 -  Chemotherapy   Patient is on Treatment Plan : COLORECTAL FOLFIRI q14d        Discussed the use of AI scribe software for clinical note  transcription with the patient, who gave verbal consent to proceed.  History of Present Illness   The patient, with a history of metastatic colon rectal cancer, presents for a follow-up visit. She reports experiencing diarrhea for about three days following her last cycle of chemotherapy. She did not use Imodium for the diarrhea due to concerns about its interaction with her daily stool softener. She also reports fatigue, which is not debilitating but does limit her activity, particularly in the days following chemotherapy. The patient also mentions a planned trip to [facility] and expresses concern about scheduling a CT scan during her trip.         All other systems were reviewed with the patient and are negative.  MEDICAL HISTORY:  Past Medical History:  Diagnosis Date   (HFpEF) heart failure with preserved ejection fraction (HCC)    a. 05/2015 Echo: EF 60-65%, no rwma, PASP .   Acute pancreatitis 08/13/2018   Anxiety    Arthritis    Asthma    Bell's palsy    no deficit   Bronchitis    Cancer (HCC) 12/2022   rectal cancer   CHF (congestive heart failure) (HCC)    COPD (chronic obstructive pulmonary disease) (HCC)    Depression    Diabetes mellitus without complication (HCC)    type II 03/2017   Dyspnea    Fatty liver    per patient   Gall stones    per patient   Gastric ulcer    Hyperlipidemia    Hypertension    Lung nodule 12/2022  upper left   OSA (obstructive sleep apnea)    a. did not tolerate CPAP. On oxygen 2L via [place]   Pancreatitis    07/2018   Shoulder injury    6/19   Smoker     SURGICAL HISTORY: Past Surgical History:  Procedure Laterality Date   BRONCHIAL NEEDLE ASPIRATION BIOPSY  01/27/2023   Procedure: BRONCHIAL NEEDLE ASPIRATION BIOPSIES;  Surgeon: Raechel Chute, MD;  Location: [facility] ENDOSCOPY;  Service: Pulmonary;;   COLONOSCOPY WITH PROPOFOL N/A 12/15/2022   Procedure: COLONOSCOPY WITH PROPOFOL;  Surgeon: Wyline Mood, MD;  Location: [facility]  ENDOSCOPY;  Service: Gastroenterology;  Laterality: N/A;   COLONOSCOPY WITH PROPOFOL N/A 12/16/2022   Procedure: COLONOSCOPY WITH PROPOFOL;  Surgeon: Wyline Mood, MD;  Location: [facility] ENDOSCOPY;  Service: Gastroenterology;  Laterality: N/A;   FLEXIBLE BRONCHOSCOPY N/A 06/20/2015   Procedure: FLEXIBLE BRONCHOSCOPY;  Surgeon: Shane Crutch, MD;  Location: [facility] ORS;  Service: Pulmonary;  Laterality: N/A;   IR IMAGING GUIDED PORT INSERTION  02/25/2023   KNEE SURGERY Left    8 knee surgeries    I have reviewed the social history and family history with the patient and they are unchanged from previous note.  ALLERGIES:  is allergic to tramadol and penicillins.  MEDICATIONS:  Current Outpatient Medications  Medication Sig Dispense Refill   albuterol (VENTOLIN HFA) 108 (90 Base) MCG/ACT inhaler Inhale 2 puffs into the lungs every 6 (six) hours as needed for wheezing or shortness of breath.      amoxicillin-clavulanate (AUGMENTIN) 875-125 MG tablet Take 1 tablet by mouth 2 (two) times daily. 14 tablet 0   Blood Glucose Monitoring Suppl (ACCU-CHEK AVIVA PLUS) w/Device KIT See admin instructions.  0   busPIRone (BUSPAR) 30 MG tablet Take 30 mg by mouth 2 (two) times daily. PER PT STATES HER DOSE WAS INCREASED     clonazePAM (KLONOPIN) 0.5 MG tablet Take 0.5 mg by mouth at bedtime as needed for anxiety.     diltiazem (CARDIZEM CD) 120 MG 24 hr capsule TAKE 1 CAPSULE BY MOUTH EVERY DAY 180 capsule 2   diltiazem (CARDIZEM) 30 MG tablet TAKE 1 TABLET(30 MG) BY MOUTH THREE TIMES DAILY AS NEEDED 270 tablet 0   DULoxetine (CYMBALTA) 60 MG capsule Take 60 mg by mouth 2 (two) times daily.      ezetimibe (ZETIA) 10 MG tablet Take 10 mg by mouth at bedtime.     fluconazole (DIFLUCAN) 100 MG tablet Take 1 tablet (100 mg total) by mouth daily. 2 tablet 0   Fluticasone-Salmeterol (ADVAIR) 250-50 MCG/DOSE AEPB Inhale 1 puff into the lungs 2 (two) times daily.     furosemide (LASIX) 40 MG tablet TAKE 1 TABLET BY  MOUTH EVERY DAY. MAY TAKE ADDITIONAL TABLET IN THE AM FOR WEIGHT GAIN AS NEEDED 110 tablet 2   JARDIANCE 25 MG TABS tablet Take 25 mg by mouth daily.     lidocaine-prilocaine (EMLA) cream Apply to affected area once 30 g 3   magic mouthwash (nystatin, lidocaine, diphenhydrAMINE, alum & mag hydroxide) suspension Take 5 mLs by mouth 4 (four) times daily as needed for mouth pain. 140 mL 1   metFORMIN (GLUCOPHAGE-XR) 500 MG 24 hr tablet Take 1,000 mg by mouth 2 (two) times daily.     ondansetron (ZOFRAN) 8 MG tablet Take 1 tablet (8 mg total) by mouth every 8 (eight) hours as needed for nausea, vomiting or refractory nausea / vomiting. Start on the third day after chemotherapy. 30 tablet 1   Oxycodone HCl 10  MG TABS Take 1 tablet (10 mg total) by mouth 3 (three) times daily as needed. 90 tablet 0   OXYGEN Inhale 2 L into the lungs at bedtime. Or as needed throughout the day if short of breath     pantoprazole (PROTONIX) 40 MG tablet Take 1 tablet (40 mg total) by mouth daily. 30 tablet 0   potassium chloride SA (KLOR-CON) 20 MEQ tablet Take 20 mEq by mouth 2 (two) times daily.     pregabalin (LYRICA) 150 MG capsule Take 1 capsule (150 mg total) by mouth 3 (three) times daily. 90 capsule 0   prochlorperazine (COMPAZINE) 10 MG tablet Take 1 tablet (10 mg total) by mouth every 6 (six) hours as needed for nausea or vomiting. 30 tablet 1   spironolactone (ALDACTONE) 25 MG tablet TAKE 1 TABLET(25 MG) BY MOUTH EVERY MORNING 90 tablet 0   sucralfate (CARAFATE) 1 G tablet Take 1 tablet (1 g total) by mouth 4 (four) times daily -  with meals and at bedtime. (Patient taking differently: Take 1 g by mouth 2 (two) times daily.) 120 tablet 0   tiotropium (SPIRIVA) 18 MCG inhalation capsule Place 18 mcg into inhaler and inhale daily.     tiZANidine (ZANAFLEX) 4 MG tablet Take 8 mg by mouth 3 (three) times daily.     Varenicline Tartrate, Starter, 0.5 MG X 11 & 1 MG X 42 TBPK See admin instructions. follow package  directions     zolpidem (AMBIEN) 10 MG tablet Take 10 mg by mouth at bedtime as needed for sleep.     No current facility-administered medications for this visit.   Facility-Administered Medications Ordered in Other Visits  Medication Dose Route Frequency Provider Last Rate Last Admin   atropine injection 0.5 mg  0.5 mg Intravenous Once PRN Malachy Mood, MD       bevacizumab-awwb (MVASI) 400 mg in sodium chloride 0.9 % 100 mL chemo infusion  5 mg/kg (Treatment Plan Recorded) Intravenous Once Malachy Mood, MD       dexamethasone (DECADRON) 10 mg in sodium chloride 0.9 % 50 mL IVPB  10 mg Intravenous Once Malachy Mood, MD 204 mL/hr at 04/28/23 1347 10 mg at 04/28/23 1347   fluorouracil (ADRUCIL) 4,200 mg in sodium chloride 0.9 % 66 mL chemo infusion  2,200 mg/m2 (Treatment Plan Recorded) Intravenous 1 day or 1 dose Malachy Mood, MD       heparin lock flush 100 unit/mL  500 Units Intracatheter Once PRN Malachy Mood, MD       irinotecan (CAMPTOSAR) 340 mg in sodium chloride 0.9 % 500 mL chemo infusion  180 mg/m2 (Treatment Plan Recorded) Intravenous Once Malachy Mood, MD       leucovorin 760 mg in sodium chloride 0.9 % 250 mL infusion  400 mg/m2 (Treatment Plan Recorded) Intravenous Once Malachy Mood, MD       sodium chloride flush (NS) 0.9 % injection 10 mL  10 mL Intracatheter PRN Malachy Mood, MD        PHYSICAL EXAMINATION: ECOG PERFORMANCE STATUS: 1 - Symptomatic but completely ambulatory  There were no vitals filed for this visit. Wt Readings from Last 3 Encounters:  04/28/23 168 lb 9.6 oz (76.5 kg)  04/14/23 170 lb 12.8 oz (77.5 kg)  03/31/23 170 lb 9.6 oz (77.4 kg)     GENERAL:alert, no distress and comfortable SKIN: skin color, texture, turgor are normal, no rashes or significant lesions EYES: normal, Conjunctiva are pink and non-injected, sclera clear NECK: supple, thyroid normal  size, non-tender, without nodularity LYMPH:  no palpable lymphadenopathy in the cervical, axillary  LUNGS: clear to  auscultation and percussion with normal breathing effort HEART: regular rate & rhythm and no murmurs and no lower extremity edema ABDOMEN:abdomen soft, non-tender and normal bowel sounds Musculoskeletal:no cyanosis of digits and no clubbing  NEURO: alert & oriented x 3 with fluent speech, no focal motor/sensory deficits    LABORATORY DATA:  I have reviewed the data as listed    Latest Ref Rng & Units 04/28/2023   12:36 PM 04/14/2023   11:41 AM 03/31/2023   11:19 AM  CBC  WBC 4.0 - 10.5 K/uL 10.8  10.0  3.8   Hemoglobin 12.0 - 15.0 g/dL 13.2  44.0  10.2   Hematocrit 36.0 - 46.0 % 42.7  39.1  40.4   Platelets 150 - 400 K/uL 210  160  228         Latest Ref Rng & Units 04/28/2023   12:36 PM 04/14/2023   11:41 AM 03/31/2023   11:19 AM  CMP  Glucose 70 - 99 mg/dL 725  366  440   BUN 6 - 20 mg/dL 6  10  8    Creatinine 0.44 - 1.00 mg/dL 3.47  4.25  9.56   Sodium 135 - 145 mmol/L 137  135  136   Potassium 3.5 - 5.1 mmol/L 3.6  3.8  4.5   Chloride 98 - 111 mmol/L 105  97  101   CO2 22 - 32 mmol/L 24  27  26    Calcium 8.9 - 10.3 mg/dL 9.3  9.1  9.8   Total Protein 6.5 - 8.1 g/dL 7.1  6.9  7.4   Total Bilirubin 0.3 - 1.2 mg/dL 0.4  0.4  0.5   Alkaline Phos 38 - 126 U/L 123  99  85   AST 15 - 41 U/L 14  15  13    ALT 0 - 44 U/L 18  18  20        RADIOGRAPHIC STUDIES: I have personally reviewed the radiological images as listed and agreed with the findings in the report. No results found.    Orders Placed This Encounter  Procedures   CT CHEST ABDOMEN PELVIS W CONTRAST    Standing Status:   Future    Standing Expiration Date:   04/27/2024    Order Specific Question:   If indicated for the ordered procedure, I authorize the administration of contrast media per Radiology protocol    Answer:   Yes    Order Specific Question:   Does the patient have a contrast media/X-ray dye allergy?    Answer:   No    Order Specific Question:   Preferred imaging location?    Answer:   [facility]    Order Specific Question:   If indicated for the ordered procedure, I authorize the administration of oral contrast media per Radiology protocol    Answer:   Yes   CEA (IN HOUSE-[facility])    Standing Status:   Standing    Number of Occurrences:   20    Standing Expiration Date:   04/27/2024   CBC with Differential (Cancer Center Only)    Standing Status:   Future    Standing Expiration Date:   05/25/2024   CMP (Cancer Center only)    Standing Status:   Future    Standing Expiration Date:   05/25/2024   CBC with Differential (Cancer Center Only)    Standing  Status:   Future    Standing Expiration Date:   06/08/2024   CMP (Cancer Center only)    Standing Status:   Future    Standing Expiration Date:   06/08/2024   All questions were answered. The patient knows to call the clinic with any problems, questions or concerns. No barriers to learning was detected. The total time spent in the appointment was 25 minutes.     Malachy Mood, MD 04/28/2023

## 2023-04-28 NOTE — Patient Instructions (Signed)
[facility]  Discharge Instructions: Thank you for choosing [facility] to provide your oncology and hematology care.   If you have a lab appointment with the Cancer Center, please go directly to the Cancer Center and check in at the registration area.   Wear comfortable clothing and clothing appropriate for easy access to any Portacath or PICC line.   We strive to give you quality time with your provider. You may need to reschedule your appointment if you arrive late (15 or more minutes).  Arriving late affects you and other patients whose appointments are after yours.  Also, if you miss three or more appointments without notifying the office, you may be dismissed from the clinic at the provider's discretion.      For prescription refill requests, have your pharmacy contact our office and allow 72 hours for refills to be completed.    Today you received the following chemotherapy and/or immunotherapy agents: Bevacizumab, Irinotecan, Leucovorin, Fluorouracil.       To help prevent nausea and vomiting after your treatment, we encourage you to take your nausea medication as directed.  BELOW ARE SYMPTOMS THAT SHOULD BE REPORTED IMMEDIATELY: *FEVER GREATER THAN 100.4 F (38 C) OR HIGHER *CHILLS OR SWEATING *NAUSEA AND VOMITING THAT IS NOT CONTROLLED WITH YOUR NAUSEA MEDICATION *UNUSUAL SHORTNESS OF BREATH *UNUSUAL BRUISING OR BLEEDING *URINARY PROBLEMS (pain or burning when urinating, or frequent urination) *BOWEL PROBLEMS (unusual diarrhea, constipation, pain near the anus) TENDERNESS IN MOUTH AND THROAT WITH OR WITHOUT PRESENCE OF ULCERS (sore throat, sores in mouth, or a toothache) UNUSUAL RASH, SWELLING OR PAIN  UNUSUAL VAGINAL DISCHARGE OR ITCHING   Items with * indicate a potential emergency and should be followed up as soon as possible or go to the Emergency Department if any problems should occur.  Please show the CHEMOTHERAPY ALERT  CARD or IMMUNOTHERAPY ALERT CARD at check-in to the Emergency Department and triage nurse.  Should you have questions after your visit or need to cancel or reschedule your appointment, please contact [facility]  Dept: [phone]  and follow the prompts.  Office hours are 8:00 a.m. to 4:30 p.m. Monday - Friday. Please note that voicemails left after 4:00 p.m. may not be returned until the following business day.  We are closed weekends and major holidays. You have access to a nurse at all times for urgent questions. Please call the main number to the clinic Dept: [phone] and follow the prompts.   For any non-urgent questions, you may also contact your provider using MyChart. We now offer e-Visits for anyone 18 and older to request care online for non-urgent symptoms. For details visit [email or web address].   Also download the MyChart app! Go to the app store, search "MyChart", open the app, select [place], and log in with your MyChart username and password.   

## 2023-04-28 NOTE — Assessment & Plan Note (Addendum)
[id number], stage IV,with oligo lung metastasis, intact MMR, KRAS G12V (+) -We reviewed her medical record and workup in detail with the patient and her daughter.  She presented with hematochezia since 05/2022 -Workup showed a near obstructing rectal mass. Path confirmed adenocarcinoma. Baseline CEA elevated to 18.1.  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  I added bevacizumab to her chemo from cycle 3 -repeat restaging CT after cycle 5. May consider consolidation radiation to primary and the lung metastasis down the road if she has good response to treatment

## 2023-04-30 ENCOUNTER — Inpatient Hospital Stay: Payer: Medicaid Other

## 2023-04-30 VITALS — BP 149/70 | HR 99 | Temp 97.5°F | Resp 16

## 2023-04-30 DIAGNOSIS — C20 Malignant neoplasm of rectum: Secondary | ICD-10-CM

## 2023-04-30 DIAGNOSIS — Z5112 Encounter for antineoplastic immunotherapy: Secondary | ICD-10-CM | POA: Diagnosis not present

## 2023-04-30 MED ORDER — HEPARIN SOD (PORK) LOCK FLUSH 100 UNIT/ML IV SOLN
500.0000 [IU] | Freq: Once | INTRAVENOUS | Status: AC | PRN
  Administered 2023-04-30: 500 [IU]

## 2023-04-30 MED ORDER — SODIUM CHLORIDE 0.9% FLUSH
10.0000 mL | INTRAVENOUS | Status: DC | PRN
  Administered 2023-04-30: 10 mL

## 2023-04-30 MED ORDER — PEGFILGRASTIM-CBQV 6 MG/0.6ML ~~LOC~~ SOSY
6.0000 mg | PREFILLED_SYRINGE | Freq: Once | SUBCUTANEOUS | Status: AC
  Administered 2023-04-30: 6 mg via SUBCUTANEOUS
  Filled 2023-04-30: qty 0.6

## 2023-05-12 ENCOUNTER — Inpatient Hospital Stay: Payer: Medicaid Other

## 2023-05-12 ENCOUNTER — Telehealth: Payer: Self-pay

## 2023-05-12 ENCOUNTER — Encounter: Payer: Self-pay | Admitting: Hematology

## 2023-05-12 ENCOUNTER — Inpatient Hospital Stay: Payer: Medicaid Other | Admitting: Hematology

## 2023-05-12 DIAGNOSIS — C20 Malignant neoplasm of rectum: Secondary | ICD-10-CM

## 2023-05-12 NOTE — Telephone Encounter (Signed)
Pt called stating that she would like to cancel her appt today d/t not feeling well.  Pt stated she had called on Monday and spoke with someone (could not recall the name of the person) regarding her symptoms and the person told her they would notify Dr. Mosetta Putt and give her a call back.  Pt stated nobody ever called back; therefore, pt called back on 05/11/2023.  Pt stated she's been having nausea but was unsure about what anti-emetic medications she can take.  Pt stated she has Zofran and Compazine but was unsure as to how she's supposed to take them; therefore, she did not take any of them. Educated pt on how to take the anti-emetics by staggering them out throughout the day to help relieve nausea.  Pt verbalized understanding and stated she will take the medications today.  Pt c/o fatigue d/t inability to eat d/t mouth sores.  Asked pt if she was using the Magic Mouthwash that was prescribed by Dr. Mosetta Putt.  Pt stated she had completed the Magic Mouthwash.  Informed pt that she has a refill for Magic Mouthwash at [facility] that she hasn't picked up.  Pt stated she wasn't aware of the refill but will go to [facility] to pick it up.  Pt stated she would like to cancel her appts today due to being fatigue/weak.  Encouraged pt to come in so pt could be further assessed and possibly given IV antiemetic and hydration.  Pt verbalized understanding but continued to request that appts be cancelled for today.  Appts cancelled and Dr. Mosetta Putt notified.  Requested scheduling to contact pt to get her rescheduled.

## 2023-05-13 ENCOUNTER — Ambulatory Visit (HOSPITAL_COMMUNITY)
Admission: RE | Admit: 2023-05-13 | Discharge: 2023-05-13 | Disposition: A | Discharge status: Home / Self Care | Payer: Medicaid Other | Source: Ambulatory Visit | Attending: Hematology

## 2023-05-13 DIAGNOSIS — C20 Malignant neoplasm of rectum: Secondary | ICD-10-CM | POA: Diagnosis present

## 2023-05-13 MED ORDER — IOHEXOL 9 MG/ML PO SOLN
500.0000 mL | ORAL | Status: AC
  Administered 2023-05-13 (×2): 500 mL via ORAL

## 2023-05-13 MED ORDER — IOHEXOL 300 MG/ML  SOLN
100.0000 mL | Freq: Once | INTRAMUSCULAR | Status: AC | PRN
  Administered 2023-05-13: 100 mL via INTRAVENOUS

## 2023-05-13 MED ORDER — IOHEXOL 9 MG/ML PO SOLN
ORAL | Status: AC
  Filled 2023-05-13: qty 1000

## 2023-05-14 ENCOUNTER — Inpatient Hospital Stay: Payer: Medicaid Other

## 2023-05-16 ENCOUNTER — Other Ambulatory Visit: Payer: Self-pay

## 2023-05-16 DIAGNOSIS — C20 Malignant neoplasm of rectum: Secondary | ICD-10-CM

## 2023-05-16 DIAGNOSIS — M792 Neuralgia and neuritis, unspecified: Secondary | ICD-10-CM

## 2023-05-16 DIAGNOSIS — Z515 Encounter for palliative care: Secondary | ICD-10-CM

## 2023-05-16 DIAGNOSIS — G893 Neoplasm related pain (acute) (chronic): Secondary | ICD-10-CM

## 2023-05-16 MED ORDER — PREGABALIN 150 MG PO CAPS: 150.0000 mg | ORAL_CAPSULE | Freq: Three times a day (TID) | ORAL | 0 refills | Status: DC

## 2023-05-23 ENCOUNTER — Other Ambulatory Visit: Payer: Self-pay | Admitting: Cardiovascular Disease

## 2023-05-25 NOTE — Assessment & Plan Note (Signed)
[id number], stage IV,with oligo lung metastasis, intact MMR, KRAS G12V (+) -We reviewed her medical record and workup in detail with the patient and her daughter.  She presented with hematochezia since 05/2022 -Workup showed a near obstructing rectal mass. Path confirmed adenocarcinoma. Baseline CEA elevated to 18.1.  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  I added bevacizumab to her chemo from cycle 3 -repeat restaging CT after cycle 5. May consider consolidation radiation to primary and the lung metastasis down the road if she has good response to treatment -CT CAP done 05/13/2023 - showed general response to treatment with no new disease present. Possible obstruction in colon  -presents for cycle 6 of COLRECTAL FOLFIRI with bevacizumab.

## 2023-05-25 NOTE — Progress Notes (Addendum)
Patient Care Team: System, Provider Not In as PCP - General Gollan, Tollie Pizza, MD as PCP - Cardiology (Cardiology) Delma Freeze, FNP as Nurse Practitioner (Family Medicine) Antonieta Iba, MD as Consulting Physician (Cardiology) Merwyn Katos, MD (Inactive) as Consulting Physician (Pulmonary Disease) Jim Like, RN as Registered Nurse Scarlett Presto, RN (Inactive) as Registered Nurse Benita Gutter, RN as Oncology Nurse Navigator Malachy Mood, MD as Consulting Physician (Oncology) Pickenpack-Cousar, Arty Baumgartner, NP as Nurse Practitioner [facility] and Palliative Medicine)  Clinic Day:  05/26/2023  Referring physician: Malachy Mood, MD  ASSESSMENT & PLAN:   Assessment & Plan: Rectal cancer (HCC) [phone], stage IV,with oligo lung metastasis, intact MMR, KRAS G12V (+) -We reviewed her medical record and workup in detail with the patient and her daughter.  She presented with hematochezia since 05/2022 -Workup showed a near obstructing rectal mass. Path confirmed adenocarcinoma. Baseline CEA elevated to 18.1.  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  I added bevacizumab to her chemo from cycle 3 -repeat restaging CT after cycle 5. May consider consolidation radiation to primary and the lung metastasis down the road if she has good response to treatment -CT CAP done 05/13/2023 - showed general response to treatment with no new disease present. Possible obstruction in colon  -presents for cycle 6 of COLRECTAL FOLFIRI with bevacizumab.    Plan: Labs reviewed  -CBC showing WBC 9.0; Hgb 12.6; Hct 39.3; Plt 166; Anc 5.9 -CMP - K  4.0; glucose 167; BUN 7; Creatinine 0.72; eGFR  > 60; Ca 8.9; LFTs normal.   -urine protein - negative Review CT CAP which showed response to therapy of rectal primary, pelvic nodal, and isolated pulmonary metastasis. Moderate amount of stool upstream from the rectal primary. Partial obstruction cannot be excluded. No new or progressive disease.  Patient currently taking single stool softener daily. Encouraged her to increase this to 2 capsules/tablets daily.  Continue to use magic mouthwash as needed to prevent further infection of thrush.  Treat bronchitis/sinus infection with augmentin 875 mg bid for 7 days (patient has tolerated this well in the past). Add diflucan to take if needed for thrush or yeast infection. Proceed with Cycle 6 FOLFIRI  and bevacizumab Labs/flush, follow up, and treatment as scheduled. Plan for 2 cycles chemo after today's treatment then proceed with chemo/RT   The patient understands the plans discussed today and is in agreement with them.  She knows to contact our office if she develops concerns prior to her next appointment.  I provided 25 minutes of face-to-face time during this encounter and > 50% was spent counseling as documented under my assessment and plan.    Carlean Jews, NP  [facility] - A DEPT OF [facility] [address] [place] [address] Dept: [phone] Dept Fax: [phone]   No orders of the defined types were placed in this encounter.     CHIEF COMPLAINT:  CC: f/u rectal cancer   Current Treatment:  COLORECTAL FOLFIRI with bevacizumab q 14 days  INTERVAL HISTORY:  Erica [name] is here today for repeat clinical assessment. She presents for day 1, cycle6 FOLFIRI with bevacizumab. She did have restaging  CT CAP on 05/13/2023. CT showed response to therapy of rectal primary, pelvic nodal, and isolated pulmonary metastasis. Moderate amount of stool upstream from the rectal primary. Partial obstruction cannot be excluded.  No new or progressive disease. She states that at time of her CT scan, she had developed nausea and vomiting and was experiencing some constipation. She is now taking stool softener daily. Not having constipation, but near diarrhea. She has started having cough along with nasal and sinus congestion which started about 3 days ago. Denies fever, chills, or night sweats. She states that historically, she has taken augmentin for these infections. She states that she does ok with this despite listing allergy to penicillin. She generally needs diflucan to go along with antibiotics. She has had a 9 pound weight gain over past 3 weeks.   I have reviewed the past medical history, past surgical history, social history and family history with the patient and they are unchanged from previous note.  ALLERGIES:  is allergic to tramadol and penicillins.  MEDICATIONS:  Current Outpatient Medications  Medication Sig Dispense Refill   albuterol (VENTOLIN HFA) 108 (90 Base) MCG/ACT inhaler Inhale 2 puffs into the lungs every 6 (six) hours as needed for wheezing or shortness of breath.      Blood Glucose Monitoring Suppl (ACCU-CHEK AVIVA PLUS) w/Device KIT See admin instructions.  0   busPIRone (BUSPAR) 30 MG tablet Take 30 mg by mouth 2 (two) times daily. PER PT STATES HER DOSE WAS INCREASED     clonazePAM (KLONOPIN) 0.5 MG tablet Take 0.5 mg by mouth at bedtime as needed for anxiety.     diltiazem (CARDIZEM CD) 120 MG 24 hr capsule TAKE 1 CAPSULE BY MOUTH EVERY DAY 180 capsule 2   diltiazem (CARDIZEM) 30 MG tablet TAKE 1 TABLET(30 MG) BY MOUTH THREE TIMES DAILY AS NEEDED 270 tablet 0   DULoxetine (CYMBALTA) 60 MG capsule Take 60 mg by mouth 2 (two) times daily.      ezetimibe (ZETIA) 10 MG tablet Take 10 mg by mouth at bedtime.     fluconazole (DIFLUCAN) 100 MG tablet Take 1 tablet (100 mg total) by mouth daily. 2 tablet 0   Fluticasone-Salmeterol (ADVAIR) 250-50 MCG/DOSE AEPB Inhale 1 puff into the lungs 2 (two)  times daily.     furosemide (LASIX) 40 MG tablet TAKE 1 TABLET BY MOUTH EVERY DAY. MAY TAKE ADDITIONAL TABLET IN THE AM FOR WEIGHT GAIN AS NEEDED 110 tablet 2   JARDIANCE 25 MG TABS tablet Take 25 mg by mouth daily.     lidocaine-prilocaine (EMLA) cream Apply to affected area once 30 g 3   magic mouthwash (nystatin, lidocaine, diphenhydrAMINE, alum & mag hydroxide) suspension Take 5 mLs by mouth 4 (four) times daily as needed for mouth pain. 140 mL 1   metFORMIN (GLUCOPHAGE-XR) 500 MG 24 hr tablet Take 1,000 mg by mouth 2 (two) times daily.     ondansetron (ZOFRAN) 8 MG tablet Take 1 tablet (8 mg total) by mouth every 8 (eight) hours as needed for nausea, vomiting or refractory nausea / vomiting. Start on the third day after chemotherapy. 30 tablet 1   Oxycodone HCl 10 MG TABS Take 1 tablet (10 mg total) by mouth 3 (three) times daily as needed. 90 tablet 0   OXYGEN Inhale 2 L into the lungs at bedtime. Or as needed throughout the day if short of breath     pantoprazole (PROTONIX) 40 MG tablet Take 1 tablet (40 mg total) by mouth  daily. 30 tablet 0   potassium chloride SA (KLOR-CON) 20 MEQ tablet Take 20 mEq by mouth 2 (two) times daily.     pregabalin (LYRICA) 150 MG capsule Take 1 capsule (150 mg total) by mouth 3 (three) times daily. 90 capsule 0   prochlorperazine (COMPAZINE) 10 MG tablet Take 1 tablet (10 mg total) by mouth every 6 (six) hours as needed for nausea or vomiting. 30 tablet 1   spironolactone (ALDACTONE) 25 MG tablet TAKE 1 TABLET(25 MG) BY MOUTH EVERY MORNING 90 tablet 3   sucralfate (CARAFATE) 1 G tablet Take 1 tablet (1 g total) by mouth 4 (four) times daily -  with meals and at bedtime. (Patient taking differently: Take 1 g by mouth 2 (two) times daily.) 120 tablet 0   tiotropium (SPIRIVA) 18 MCG inhalation capsule Place 18 mcg into inhaler and inhale daily.     tiZANidine (ZANAFLEX) 4 MG tablet Take 8 mg by mouth 3 (three) times daily.     Varenicline Tartrate, Starter, 0.5 MG  X 11 & 1 MG X 42 TBPK See admin instructions. follow package directions     zolpidem (AMBIEN) 10 MG tablet Take 10 mg by mouth at bedtime as needed for sleep.     No current facility-administered medications for this visit.    HISTORY OF PRESENT ILLNESS:   Oncology History Overview Note   Cancer Staging  Rectal cancer [facility]) Staging form: Colon and Rectum, AJCC 8th Edition - Clinical stage from 12/23/2022: Stage IVA (cT3, cN2, pM1a) - Signed by Rickard Patience, MD on 02/04/2023 Stage prefix: Initial diagnosis     Rectal cancer (HCC)  12/15/2022 Pathology Results   Colonoscopy:   Positive for invasive adenocarcinoma, G1 well differentiated      12/15/2022 Procedure   Colonoscopy under the care of Dr. Sharlet Salina   Findings: -The perianal and digital rectal examinations were normal. -Extensive amounts of liquid semi- liquid semi- solid stool was found in the entire colon -A partially obstructing large mass was found in the rectum. The mass was circumferential.   12/23/2022 Initial Diagnosis   Rectal cancer [facility]) Patient was seen by gastroenterology due to hematochezia as well as unintentional weight loss.  12/15/2022, colonoscopy showed fungating partially obstructing large mass in the rectum, circumferential, mass extended from about 15 cm from anus to about 8 cm from anal verge.  Biopsy was taken. Poor preparation of colon.  Colonoscopy was repeated on 12/16/2022, preparations inadequate.  Pathology showed invasive adenocarcinoma.   12/23/2022 Cancer Staging   Staging form: Colon and Rectum, AJCC 8th Edition - Clinical stage from 12/23/2022: Stage Unknown (cTX, cNX) - Signed by Rickard Patience, MD on 12/23/2022 Stage prefix: Initial diagnosis  Staging form: Colon and Rectum, AJCC 8th Edition - Clinical stage from 12/23/2022: Stage IVA (cT3, cN2, pM1a) - Signed by Rickard Patience, MD on 02/04/2023 Stage prefix: Initial diagnosis   12/23/2022 Tumor Marker   Patient's tumor was tested for the following markers:  CEA. Results of the tumor marker test revealed elevated at 18.1.   01/03/2023 Imaging   CT chest abdomen with contrast showed 2.2 cm left upper lobe nodule, suspicious for metastasis. No findings suspicious for metastatic disease in the abdomen. Bilateral adrenal adenomas, benign. Cholelithiasis, without associated inflammatory changes. Aortic Atherosclerosis (ICD10-I70.0) and Emphysema   01/08/2023 Imaging   MRI pelvis without contrast-rectal protocol showed 9.2 cm concentric mid/lower rectal mass, corresponding to the patient's known primary rectal adenocarcinoma, as above. Rectal adenocarcinoma T stage: T3c Rectal adenocarcinoma N stage:  N2 Distance from tumor to the internal anal sphincter is 3.4 cm.   01/27/2023 Procedure   Patient underwent biopsy via bronchoscopy by pulmonology. Biopsy pathology showed adenocarcinoma, Immunohistochemistry is positive with cytokeratin 20 and CDX2 and negative with cytokeratin 7, TTF-1 and napsin A consistent with colonic adenocarcinoma    01/27/2023 Pathology Results   CASE: [phone]   FINAL MICROSCOPIC DIAGNOSIS:  A. LUNG, LUL, FINE NEEDLE ASPIRATION:  Adenocarcinoma  See comment   ADDENDUM:  Immunohistochemistry is positive with cytokeratin 20 and CDX2  and negative with cytokeratin 7, TTF-1 and napsin A consistent with  colonic adenocarcinoma.    02/28/2023 Miscellaneous      03/03/2023 -  Chemotherapy   Patient is on Treatment Plan : COLORECTAL FOLFIRI q14d     05/13/2023 Imaging   CT chest, abdomen, and pelvis with contrast IMPRESSION: 1. Response to therapy of rectal primary, pelvic nodal, and isolated pulmonary metastasis. 2. Moderate amount of stool upstream from the rectal primary. Partial obstruction cannot be excluded. 3. No new or progressive disease. 4. Right Port-A-Cath with anterior SVC pericatheter small volume thrombus. 5. Age advanced coronary artery atherosclerosis. Recommend assessment of coronary risk  factors. 6. Incidental findings, including: Cholelithiasis. Aortic Atherosclerosis (ICD10-I70.0).       REVIEW OF SYSTEMS:   Constitutional: Denies fevers, chills or abnormal weight loss. Fatigue  Eyes: Denies blurriness of vision Ears, nose, mouth, throat, and face: Denies mucositis or sore throat. Has nasal congestion.  Respiratory: having cough and wheezing Cardiovascular: Denies palpitation, chest discomfort or lower extremity swelling Gastrointestinal:  Denies nausea or heartburn. She reports several loose bowel movements everyday. No longer having blood in bowel movements.  Skin: Denies abnormal skin rashes Lymphatics: Denies new lymphadenopathy or easy bruising Neurological:Denies numbness, tingling or new weaknesses Behavioral/Psych: Mood is stable, no new changes  All other systems were reviewed with the patient and are negative.   VITALS:   Today's Vitals   05/26/23 1158 05/26/23 1200  BP: 116/73   Pulse: 94   Resp: 17   Temp: 98 F (36.7 C)   TempSrc: Oral   SpO2: 96%   Weight: 177 lb (80.3 kg)   PainSc:  0-No pain   Body mass index is 26.91 kg/m.   Wt Readings from Last 3 Encounters:  05/26/23 177 lb (80.3 kg)  04/28/23 168 lb 9.6 oz (76.5 kg)  04/14/23 170 lb 12.8 oz (77.5 kg)    Body mass index is 26.91 kg/m.  Performance status (ECOG): 1 - Symptomatic but completely ambulatory  PHYSICAL EXAM:   GENERAL:alert, no distress and comfortable SKIN: skin color, texture, turgor are normal, no rashes or significant lesions EYES: normal, Conjunctiva are pink and non-injected, sclera clear OROPHARYNX:no exudate, no erythema and lips, buccal mucosa, and tongue normal  NECK: supple, thyroid normal size, non-tender, without nodularity LYMPH:  no palpable lymphadenopathy in the cervical, axillary or inguinal LUNGS: clear to auscultation and percussion with normal breathing effort. Congested, non-productive cough noted.  HEART: regular rate & rhythm and no  murmurs and no lower extremity edema ABDOMEN:abdomen soft, non-tender and normal bowel sounds Musculoskeletal:no cyanosis of digits and no clubbing  NEURO: alert & oriented x 3 with fluent speech, no focal motor/sensory deficits  LABORATORY DATA:  I have reviewed the data as listed    Component Value Date/Time   NA 137 04/28/2023 1236   NA 140 04/22/2016 1532   NA 139 02/26/2014 1826   K 3.6 04/28/2023 1236   K 3.7 02/26/2014 1826  CL 105 04/28/2023 1236   CL 104 02/26/2014 1826   CO2 24 04/28/2023 1236   CO2 27 02/26/2014 1826   GLUCOSE 201 (H) 04/28/2023 1236   GLUCOSE 132 (H) 02/26/2014 1826   BUN 6 04/28/2023 1236   BUN 10 04/22/2016 1532   BUN 4 (L) 02/26/2014 1826   CREATININE 0.71 04/28/2023 1236   CREATININE 0.74 02/26/2014 1826   CALCIUM 9.3 04/28/2023 1236   CALCIUM 8.3 (L) 02/26/2014 1826   PROT 7.1 04/28/2023 1236   PROT 7.6 03/10/2013 0100   ALBUMIN 3.9 04/28/2023 1236   ALBUMIN 3.6 03/10/2013 0100   AST 14 (L) 04/28/2023 1236   ALT 18 04/28/2023 1236   ALT 57 03/10/2013 0100   ALKPHOS 123 04/28/2023 1236   ALKPHOS 89 03/10/2013 0100   BILITOT 0.4 04/28/2023 1236   GFRNONAA >60 04/28/2023 1236   GFRNONAA >60 02/26/2014 1826   GFRAA >60 09/12/2018 0422   GFRAA >60 02/26/2014 1826    No results found for: "SPEP", "UPEP"  Lab Results  Component Value Date   WBC 9.0 05/26/2023   NEUTROABS 5.9 05/26/2023   HGB 12.6 05/26/2023   HCT 39.3 05/26/2023   MCV 95.9 05/26/2023   PLT 166 05/26/2023      RADIOGRAPHIC STUDIES: I have personally reviewed the radiological images as listed and agreed with the findings in the report. CT CHEST ABDOMEN PELVIS W CONTRAST  Result Date: 05/24/2023 CLINICAL DATA:  Rectal cancer. Oligometastatic lung lesion. Evaluate treatment response. * Tracking Code: BO * EXAM: CT CHEST, ABDOMEN, AND PELVIS WITH CONTRAST TECHNIQUE: Multidetector CT imaging of the chest, abdomen and pelvis was performed following the standard  protocol during bolus administration of intravenous contrast. RADIATION DOSE REDUCTION: This exam was performed according to the departmental dose-optimization program which includes automated exposure control, adjustment of the mA and/or kV according to patient size and/or use of iterative reconstruction technique. CONTRAST:  OMNIPAQUE IOHEXOL 300 MG/ML  SOLN COMPARISON:  01/26/2023 chest CT. Most recent abdominal CT 01/03/2023. MR pelvis 01/03/2023. Clinic note 04/28/2023. FINDINGS: CT CHEST FINDINGS Cardiovascular: Right Port-A-Cath tip superior caval/atrial junction. Filling defect within the anterior SVC on 26/2 likely represents minimal pericatheter thrombus. Aortic atherosclerosis. Tortuous thoracic aorta. Normal heart size, without pericardial effusion. Left main and 3 vessel coronary artery calcification. No central pulmonary embolism, on this non-dedicated study. Mediastinum/Nodes: No supraclavicular adenopathy. No mediastinal or hilar adenopathy. Dilated thoracic duct is similar on 59/2. Lungs/Pleura: No pleural fluid. Upper lobe predominant micronodularity is again likely indicative of smoking related respiratory bronchiolitis. Mild centrilobular emphysema. 2 mm right lower lobe pulmonary nodule is unchanged on 89/4. Subpleural left lower lobe 3 mm nodule is unchanged on 112/4. Central left lower lobe pulmonary nodule measures 1.6 by 1.4 cm on 70/4 versus 1.9 x 2.0 cm on the prior exam when remeasured in a similar fashion. 1.8 cm craniocaudal on coronal image 93/5 versus 2.2 cm when remeasured in a similar fashion on the prior. Musculoskeletal: No acute osseous abnormality. CT ABDOMEN PELVIS FINDINGS Hepatobiliary: Normal liver. Dependent tiny gallstones without acute cholecystitis or biliary duct dilatation. Pancreas: Normal, without mass or ductal dilatation. Spleen: Normal in size, without focal abnormality. Adrenals/Urinary Tract: Bilateral tiny adrenal nodules have been present back to 2017  and can be presumed benign adenomas. No specific follow-up is indicated. Normal kidneys, without hydronephrosis. Normal urinary bladder. Stomach/Bowel: Proximal gastric underdistention. The rectal primary is felt to be decreased given cross modality comparison to prior pelvic MRI. Residual soft tissue fullness and mild  hyperenhancement including on images 106-109 of series 2. The upstream sigmoid is stool filled, and a component of low-grade partial obstruction cannot be excluded. Normal terminal ileum and appendix.  Normal small bowel. Vascular/Lymphatic: Advanced aortic and branch vessel atherosclerosis. Small abdominal retroperitoneal nodes are similar, not pathologic by size criteria. No pelvic sidewall adenopathy. Improvement and resolution of previously described pelvic adenopathy. An index right perirectal node measures 4 mm on 107/2 versus 10 mm on the prior MRI. The enlarged left perirectal nodes are no longer identified. A diminutive presacral node of 3 mm on 100/2 measured 8 mm on the prior MRI. Reproductive: Normal uterus and adnexa. Other: No significant free fluid. No evidence of omental or peritoneal disease. Musculoskeletal: Presumed heterotopic ossification posterior to the proximal left femur. No suspicious osseous lesions. Lumbosacral spondylosis. IMPRESSION: 1. Response to therapy of rectal primary, pelvic nodal, and isolated pulmonary metastasis. 2. Moderate amount of stool upstream from the rectal primary. Partial obstruction cannot be excluded. 3. No new or progressive disease. 4. Right Port-A-Cath with anterior SVC pericatheter small volume thrombus. 5. Age advanced coronary artery atherosclerosis. Recommend assessment of coronary risk factors. 6. Incidental findings, including: Cholelithiasis. Aortic Atherosclerosis (ICD10-I70.0). Electronically Signed   By: Jeronimo Greaves M.D.   On: 05/24/2023 14:23    Addendum I have seen the patient, examined her. I agree with the assessment and and plan  and have edited the notes.   I personally reviewed her restaging CT scan images and discussed the finding with her. She has had good PR so far, will continue chemo FOLFOX for a total of 8 cycles, then proceed with concurrent chemoRT to her primary rectal cancer and SBRT to her oligo lung met. All questions were answered. She agrees with the plan.  Malachy Mood MD 05/26/2023

## 2023-05-26 ENCOUNTER — Inpatient Hospital Stay (HOSPITAL_BASED_OUTPATIENT_CLINIC_OR_DEPARTMENT_OTHER): Payer: Medicaid Other

## 2023-05-26 ENCOUNTER — Inpatient Hospital Stay: Payer: Medicaid Other | Attending: Oncology | Admitting: Nurse Practitioner

## 2023-05-26 ENCOUNTER — Inpatient Hospital Stay: Payer: Medicaid Other | Attending: Oncology

## 2023-05-26 VITALS — BP 116/73 | HR 94 | Temp 98.0°F | Resp 17 | Wt 177.0 lb

## 2023-05-26 VITALS — BP 159/86 | HR 79 | Temp 98.7°F | Resp 16

## 2023-05-26 DIAGNOSIS — Z5189 Encounter for other specified aftercare: Secondary | ICD-10-CM | POA: Diagnosis not present

## 2023-05-26 DIAGNOSIS — Z452 Encounter for adjustment and management of vascular access device: Secondary | ICD-10-CM | POA: Insufficient documentation

## 2023-05-26 DIAGNOSIS — Z5111 Encounter for antineoplastic chemotherapy: Secondary | ICD-10-CM | POA: Diagnosis present

## 2023-05-26 DIAGNOSIS — C7800 Secondary malignant neoplasm of unspecified lung: Secondary | ICD-10-CM | POA: Diagnosis not present

## 2023-05-26 DIAGNOSIS — M542 Cervicalgia: Secondary | ICD-10-CM | POA: Diagnosis not present

## 2023-05-26 DIAGNOSIS — G8929 Other chronic pain: Secondary | ICD-10-CM | POA: Insufficient documentation

## 2023-05-26 DIAGNOSIS — G629 Polyneuropathy, unspecified: Secondary | ICD-10-CM | POA: Diagnosis not present

## 2023-05-26 DIAGNOSIS — C20 Malignant neoplasm of rectum: Secondary | ICD-10-CM | POA: Diagnosis present

## 2023-05-26 DIAGNOSIS — M25552 Pain in left hip: Secondary | ICD-10-CM | POA: Insufficient documentation

## 2023-05-26 DIAGNOSIS — Z95828 Presence of other vascular implants and grafts: Secondary | ICD-10-CM

## 2023-05-26 DIAGNOSIS — M549 Dorsalgia, unspecified: Secondary | ICD-10-CM | POA: Insufficient documentation

## 2023-05-26 DIAGNOSIS — Z5112 Encounter for antineoplastic immunotherapy: Secondary | ICD-10-CM | POA: Insufficient documentation

## 2023-05-26 DIAGNOSIS — Z515 Encounter for palliative care: Secondary | ICD-10-CM | POA: Diagnosis not present

## 2023-05-26 LAB — CBC WITH DIFFERENTIAL (CANCER CENTER ONLY)
Abs Immature Granulocytes: 0.03 10*3/uL (ref 0.00–0.07)
Basophils Absolute: 0.1 10*3/uL (ref 0.0–0.1)
Basophils Relative: 1 %
Eosinophils Absolute: 0.3 10*3/uL (ref 0.0–0.5)
Eosinophils Relative: 3 %
HCT: 39.3 % (ref 36.0–46.0)
Hemoglobin: 12.6 g/dL (ref 12.0–15.0)
Immature Granulocytes: 0 %
Lymphocytes Relative: 21 %
Lymphs Abs: 1.9 10*3/uL (ref 0.7–4.0)
MCH: 30.7 pg (ref 26.0–34.0)
MCHC: 32.1 g/dL (ref 30.0–36.0)
MCV: 95.9 fL (ref 80.0–100.0)
Monocytes Absolute: 0.9 10*3/uL (ref 0.1–1.0)
Monocytes Relative: 10 %
Neutro Abs: 5.9 10*3/uL (ref 1.7–7.7)
Neutrophils Relative %: 65 %
Platelet Count: 166 10*3/uL (ref 150–400)
RBC: 4.1 MIL/uL (ref 3.87–5.11)
RDW: 22.3 % — ABNORMAL HIGH (ref 11.5–15.5)
WBC Count: 9 10*3/uL (ref 4.0–10.5)
nRBC: 0 % (ref 0.0–0.2)

## 2023-05-26 LAB — CMP (CANCER CENTER ONLY)
ALT: 11 U/L (ref 0–44)
AST: 14 U/L — ABNORMAL LOW (ref 15–41)
Albumin: 3.5 g/dL (ref 3.5–5.0)
Alkaline Phosphatase: 63 U/L (ref 38–126)
Anion gap: 6 (ref 5–15)
BUN: 7 mg/dL (ref 6–20)
CO2: 28 mmol/L (ref 22–32)
Calcium: 8.9 mg/dL (ref 8.9–10.3)
Chloride: 105 mmol/L (ref 98–111)
Creatinine: 0.72 mg/dL (ref 0.44–1.00)
GFR, Estimated: >60 mL/min (ref >60)
Glucose, Bld: 167 mg/dL — ABNORMAL HIGH (ref 70–99)
Potassium: 4 mmol/L (ref 3.5–5.1)
Sodium: 139 mmol/L (ref 135–145)
Total Bilirubin: 0.4 mg/dL (ref <1.2)
Total Protein: 6.4 g/dL — ABNORMAL LOW (ref 6.5–8.1)

## 2023-05-26 LAB — CEA (IN HOUSE-CHCC): CEA (CHCC-In House): 11.26 ng/mL — ABNORMAL HIGH (ref 0.00–5.00)

## 2023-05-26 LAB — TOTAL PROTEIN, URINE DIPSTICK: Protein, ur: NEGATIVE mg/dL

## 2023-05-26 MED ORDER — HEPARIN SOD (PORK) LOCK FLUSH 100 UNIT/ML IV SOLN: 500.0000 [IU] | Freq: Once | INTRAVENOUS | Status: DC | PRN

## 2023-05-26 MED ORDER — DEXAMETHASONE SODIUM PHOSPHATE 10 MG/ML IJ SOLN
10.0000 mg | Freq: Once | INTRAMUSCULAR | Status: AC
  Administered 2023-05-26: 10 mg via INTRAVENOUS
  Filled 2023-05-26: qty 1

## 2023-05-26 MED ORDER — AMOXICILLIN-POT CLAVULANATE 875-125 MG PO TABS: 1.0000 | ORAL_TABLET | Freq: Two times a day (BID) | ORAL | 0 refills | Status: DC

## 2023-05-26 MED ORDER — SODIUM CHLORIDE 0.9 % IV SOLN
180.0000 mg/m2 | Freq: Once | INTRAVENOUS | Status: AC
  Administered 2023-05-26: 340 mg via INTRAVENOUS
  Filled 2023-05-26: qty 15

## 2023-05-26 MED ORDER — SODIUM CHLORIDE 0.9% FLUSH
10.0000 mL | Freq: Once | INTRAVENOUS | Status: AC
  Administered 2023-05-26: 10 mL

## 2023-05-26 MED ORDER — SODIUM CHLORIDE 0.9 % IV SOLN
400.0000 mg/m2 | Freq: Once | INTRAVENOUS | Status: AC
  Administered 2023-05-26: 760 mg via INTRAVENOUS
  Filled 2023-05-26: qty 25

## 2023-05-26 MED ORDER — ATROPINE SULFATE 1 MG/ML IV SOLN
0.5000 mg | Freq: Once | INTRAVENOUS | Status: AC | PRN
  Administered 2023-05-26: 0.5 mg via INTRAVENOUS
  Filled 2023-05-26: qty 1

## 2023-05-26 MED ORDER — SODIUM CHLORIDE 0.9 % IV SOLN
2200.0000 mg/m2 | INTRAVENOUS | Status: DC
  Administered 2023-05-26: 4200 mg via INTRAVENOUS
  Filled 2023-05-26: qty 84

## 2023-05-26 MED ORDER — SODIUM CHLORIDE 0.9% FLUSH: 10.0000 mL | INTRAVENOUS | Status: DC | PRN

## 2023-05-26 MED ORDER — FLUCONAZOLE 150 MG PO TABS: ORAL_TABLET | ORAL | 0 refills | Status: DC

## 2023-05-26 MED ORDER — SODIUM CHLORIDE 0.9 % IV SOLN
5.0000 mg/kg | Freq: Once | INTRAVENOUS | Status: AC
  Administered 2023-05-26: 400 mg via INTRAVENOUS
  Filled 2023-05-26: qty 16

## 2023-05-26 MED ORDER — PALONOSETRON HCL INJECTION 0.25 MG/5ML
0.2500 mg | Freq: Once | INTRAVENOUS | Status: AC
  Administered 2023-05-26: 0.25 mg via INTRAVENOUS
  Filled 2023-05-26: qty 5

## 2023-05-26 MED ORDER — SODIUM CHLORIDE 0.9 % IV SOLN: Freq: Once | INTRAVENOUS | Status: AC

## 2023-05-26 NOTE — Patient Instructions (Signed)
[facility] - [facility] DEPT [facility]  Discharge Instructions: Thank you for choosing [facility] to provide your oncology and hematology care.   If you have a lab appointment with the Cancer Center, please go directly to the Cancer Center and check in at the registration area.   Wear comfortable clothing and clothing appropriate for easy access to any Portacath or PICC line.   We strive to give you quality time with your provider. You may need to reschedule your appointment if you arrive late (15 or more minutes).  Arriving late affects you and other patients whose appointments are after yours.  Also, if you miss three or more appointments without notifying the office, you may be dismissed from the clinic at the provider's discretion.      For prescription refill requests, have your pharmacy contact our office and allow 72 hours for refills to be completed.    Today you received the following chemotherapy and/or immunotherapy agents: Bevacizumab, Irinotecan, Leucovorin, and Fluorouracil.      To help prevent nausea and vomiting after your treatment, we encourage you to take your nausea medication as directed.  BELOW ARE SYMPTOMS THAT SHOULD BE REPORTED IMMEDIATELY: *FEVER GREATER THAN 100.4 F (38 C) OR HIGHER *CHILLS OR SWEATING *NAUSEA AND VOMITING THAT IS NOT CONTROLLED WITH YOUR NAUSEA MEDICATION *UNUSUAL SHORTNESS OF BREATH *UNUSUAL BRUISING OR BLEEDING *URINARY PROBLEMS (pain or burning when urinating, or frequent urination) *BOWEL PROBLEMS (unusual diarrhea, constipation, pain near the anus) TENDERNESS IN MOUTH AND THROAT WITH OR WITHOUT PRESENCE OF ULCERS (sore throat, sores in mouth, or a toothache) UNUSUAL RASH, SWELLING OR PAIN  UNUSUAL VAGINAL DISCHARGE OR ITCHING   Items with * indicate a potential emergency and should be followed up as soon as possible or go to the Emergency Department if any problems should occur.  Please show  the CHEMOTHERAPY ALERT CARD or IMMUNOTHERAPY ALERT CARD at check-in to the Emergency Department and triage nurse.  Should you have questions after your visit or need to cancel or reschedule your appointment, please contact [facility] - A DEPT OF [facility]  Dept: [phone]  and follow the prompts.  Office hours are 8:00 a.m. to 4:30 p.m. Monday - Friday. Please note that voicemails left after 4:00 p.m. may not be returned until the following business day.  We are closed weekends and major holidays. You have access to a nurse at all times for urgent questions. Please call the main number to the clinic Dept: [phone] and follow the prompts.   For any non-urgent questions, you may also contact your provider using MyChart. We now offer e-Visits for anyone 3 and older to request care online for non-urgent symptoms. For details visit [email or web address].   Also download the MyChart app! Go to the app store, search "MyChart", open the app, select [place], and log in with your MyChart username and password.  The chemotherapy medication bag should finish at 46 hours, 96 hours, or 7 days. For example, if your pump is scheduled for 46 hours and it was put on at 4:00 p.m., it should finish at 2:00 p.m. the day it is scheduled to come off regardless of your appointment time.     Estimated time to finish at:    If the display on your pump reads "Low Volume" and it is beeping, take the batteries out of the pump and come to the cancer center for it to  be taken off.   If the pump alarms go off prior to the pump reading "Low Volume" then call [phone] and someone can assist you.  If the plunger comes out and the chemotherapy medication is leaking out, please use your home chemo spill kit to clean up the spill. Do NOT use paper towels or other household products.  If you have problems or questions regarding your pump, please call either [phone] (24  hours a day) or the cancer center Monday-Friday 8:00 a.m.- 4:30 p.m. at the clinic number and we will assist you. If you are unable to get assistance, then go to the nearest Emergency Department and ask the staff to contact the IV team for assistance.

## 2023-05-27 ENCOUNTER — Encounter: Payer: Self-pay | Admitting: Hematology

## 2023-05-27 ENCOUNTER — Telehealth: Payer: Self-pay | Admitting: Radiation Oncology

## 2023-05-27 NOTE — Telephone Encounter (Signed)
Pt returned call and was able to be scheduled for  [facility] as instructed via inbasket.

## 2023-05-27 NOTE — Telephone Encounter (Signed)
Unable to LVM on pt's line, 2 attempts made. LVM on one daughter's line to have pt call us back to make appts.

## 2023-05-28 ENCOUNTER — Emergency Department (HOSPITAL_COMMUNITY): Payer: Medicaid Other

## 2023-05-28 ENCOUNTER — Emergency Department (HOSPITAL_COMMUNITY)
Admission: EM | Admit: 2023-05-28 | Discharge: 2023-05-28 | Disposition: A | Discharge status: Home / Self Care | Payer: Medicaid Other | Attending: Emergency Medicine | Admitting: Emergency Medicine

## 2023-05-28 ENCOUNTER — Other Ambulatory Visit: Payer: Self-pay

## 2023-05-28 ENCOUNTER — Inpatient Hospital Stay: Payer: Medicaid Other

## 2023-05-28 VITALS — BP 160/84 | HR 98 | Temp 97.4°F | Resp 15

## 2023-05-28 DIAGNOSIS — Z7951 Long term (current) use of inhaled steroids: Secondary | ICD-10-CM | POA: Diagnosis not present

## 2023-05-28 DIAGNOSIS — R109 Unspecified abdominal pain: Secondary | ICD-10-CM | POA: Diagnosis present

## 2023-05-28 DIAGNOSIS — Z79899 Other long term (current) drug therapy: Secondary | ICD-10-CM | POA: Insufficient documentation

## 2023-05-28 DIAGNOSIS — Z85048 Personal history of other malignant neoplasm of rectum, rectosigmoid junction, and anus: Secondary | ICD-10-CM | POA: Insufficient documentation

## 2023-05-28 DIAGNOSIS — C20 Malignant neoplasm of rectum: Secondary | ICD-10-CM

## 2023-05-28 DIAGNOSIS — I10 Essential (primary) hypertension: Secondary | ICD-10-CM

## 2023-05-28 DIAGNOSIS — K859 Acute pancreatitis without necrosis or infection, unspecified: Secondary | ICD-10-CM | POA: Diagnosis not present

## 2023-05-28 DIAGNOSIS — Z7984 Long term (current) use of oral hypoglycemic drugs: Secondary | ICD-10-CM | POA: Diagnosis not present

## 2023-05-28 DIAGNOSIS — I509 Heart failure, unspecified: Secondary | ICD-10-CM | POA: Diagnosis not present

## 2023-05-28 DIAGNOSIS — J449 Chronic obstructive pulmonary disease, unspecified: Secondary | ICD-10-CM | POA: Insufficient documentation

## 2023-05-28 DIAGNOSIS — E119 Type 2 diabetes mellitus without complications: Secondary | ICD-10-CM | POA: Insufficient documentation

## 2023-05-28 DIAGNOSIS — Z5112 Encounter for antineoplastic immunotherapy: Secondary | ICD-10-CM | POA: Diagnosis not present

## 2023-05-28 DIAGNOSIS — I110 Hypertensive heart disease with heart failure: Secondary | ICD-10-CM | POA: Insufficient documentation

## 2023-05-28 LAB — LIPASE, BLOOD: Lipase: 540 U/L — ABNORMAL HIGH (ref 11–51)

## 2023-05-28 LAB — URINALYSIS, ROUTINE W REFLEX MICROSCOPIC
Bilirubin Urine: NEGATIVE
Glucose, UA: >=500 mg/dL — AB
Ketones, ur: 5 mg/dL — AB
Nitrite: NEGATIVE
Protein, ur: NEGATIVE mg/dL
Specific Gravity, Urine: 1.013 (ref 1.005–1.030)
pH: 8 (ref 5.0–8.0)

## 2023-05-28 LAB — CBC WITH DIFFERENTIAL/PLATELET
Abs Immature Granulocytes: 0.05 10*3/uL (ref 0.00–0.07)
Basophils Absolute: 0 10*3/uL (ref 0.0–0.1)
Basophils Relative: 0 %
Eosinophils Absolute: 0 10*3/uL (ref 0.0–0.5)
Eosinophils Relative: 0 %
HCT: 43.1 % (ref 36.0–46.0)
Hemoglobin: 13.8 g/dL (ref 12.0–15.0)
Immature Granulocytes: 0 %
Lymphocytes Relative: 4 %
Lymphs Abs: 0.5 10*3/uL — ABNORMAL LOW (ref 0.7–4.0)
MCH: 31.2 pg (ref 26.0–34.0)
MCHC: 32 g/dL (ref 30.0–36.0)
MCV: 97.5 fL (ref 80.0–100.0)
Monocytes Absolute: 0.4 10*3/uL (ref 0.1–1.0)
Monocytes Relative: 3 %
Neutro Abs: 12.5 10*3/uL — ABNORMAL HIGH (ref 1.7–7.7)
Neutrophils Relative %: 93 %
Platelets: 156 10*3/uL (ref 150–400)
RBC: 4.42 MIL/uL (ref 3.87–5.11)
RDW: 21.6 % — ABNORMAL HIGH (ref 11.5–15.5)
WBC: 13.4 10*3/uL — ABNORMAL HIGH (ref 4.0–10.5)
nRBC: 0 % (ref 0.0–0.2)

## 2023-05-28 LAB — COMPREHENSIVE METABOLIC PANEL
ALT: 21 U/L (ref 0–44)
AST: 24 U/L (ref 15–41)
Albumin: 4.1 g/dL (ref 3.5–5.0)
Alkaline Phosphatase: 63 U/L (ref 38–126)
Anion gap: 11 (ref 5–15)
BUN: 11 mg/dL (ref 6–20)
CO2: 25 mmol/L (ref 22–32)
Calcium: 9 mg/dL (ref 8.9–10.3)
Chloride: 98 mmol/L (ref 98–111)
Creatinine, Ser: 0.57 mg/dL (ref 0.44–1.00)
GFR, Estimated: >60 mL/min (ref >60)
Glucose, Bld: 176 mg/dL — ABNORMAL HIGH (ref 70–99)
Potassium: 3.5 mmol/L (ref 3.5–5.1)
Sodium: 134 mmol/L — ABNORMAL LOW (ref 135–145)
Total Bilirubin: 0.9 mg/dL (ref <1.2)
Total Protein: 7.5 g/dL (ref 6.5–8.1)

## 2023-05-28 MED ORDER — OXYCODONE-ACETAMINOPHEN 5-325 MG PO TABS: 1.0000 | ORAL_TABLET | Freq: Four times a day (QID) | ORAL | 0 refills | Status: DC | PRN

## 2023-05-28 MED ORDER — ONDANSETRON HCL 4 MG/2ML IJ SOLN
4.0000 mg | Freq: Once | INTRAMUSCULAR | Status: AC
  Administered 2023-05-28: 4 mg via INTRAVENOUS
  Filled 2023-05-28: qty 2

## 2023-05-28 MED ORDER — PEGFILGRASTIM-FPGK 6 MG/0.6ML ~~LOC~~ SOSY
6.0000 mg | PREFILLED_SYRINGE | Freq: Once | SUBCUTANEOUS | Status: AC
Start: 2023-05-28 — End: 2023-05-28
  Administered 2023-05-28: 6 mg via SUBCUTANEOUS

## 2023-05-28 MED ORDER — HEPARIN SOD (PORK) LOCK FLUSH 100 UNIT/ML IV SOLN
500.0000 [IU] | Freq: Once | INTRAVENOUS | Status: AC
  Administered 2023-05-28: 500 [IU]
  Filled 2023-05-28: qty 5

## 2023-05-28 MED ORDER — MORPHINE SULFATE (PF) 4 MG/ML IV SOLN
4.0000 mg | Freq: Once | INTRAVENOUS | Status: AC
  Administered 2023-05-28: 4 mg via INTRAVENOUS
  Filled 2023-05-28: qty 1

## 2023-05-28 MED ORDER — IOHEXOL 300 MG/ML  SOLN
100.0000 mL | Freq: Once | INTRAMUSCULAR | Status: AC | PRN
  Administered 2023-05-28: 100 mL via INTRAVENOUS

## 2023-05-28 MED ORDER — SODIUM CHLORIDE 0.9% FLUSH
10.0000 mL | INTRAVENOUS | Status: DC | PRN
  Administered 2023-05-28: 10 mL

## 2023-05-28 MED ORDER — SODIUM CHLORIDE 0.9 % IV BOLUS
1000.0000 mL | Freq: Once | INTRAVENOUS | Status: AC
  Administered 2023-05-28: 1000 mL via INTRAVENOUS

## 2023-05-28 NOTE — ED Notes (Signed)
MD made aware of systolic BP's.

## 2023-05-28 NOTE — Discharge Instructions (Addendum)
Take your routine meds (including your BP medication) as usual when you get home.

## 2023-05-28 NOTE — ED Triage Notes (Signed)
Patient from cancer center c/o ABD pain. She states ABD started at 0400. She reports pain radiates to back. She also reports LBM 2-3 days ago.

## 2023-05-28 NOTE — ED Notes (Signed)
Patient agrees to call her daughter for a ride home post Morphine administration.

## 2023-05-28 NOTE — Progress Notes (Signed)
Patient arrived to clinic today for pump D/C and GCSF injection. C/o multiple episodes of vomiting at home accompanied by diffuse abdominal pain, at times severe. Patient reports she would like to be seen in the ED for further evaluation. This RN called and spoke with Charge RN, Huntley Dec, of ED and made her aware that patient was coming to be seen. Patient taken via wheelchair to ED, in NAD, A&O x4. Port left accessed for use in ED.

## 2023-05-28 NOTE — ED Provider Notes (Signed)
[place] EMERGENCY DEPARTMENT AT [facility] Provider Note   CSN: [id number] Arrival date & time: 05/28/23  1422     History  Chief Complaint  Patient presents with   Abdominal Pain    Erica [name] is a 57 y.o. female.  Pt is a 57 yo female with pmhx significant for metastatic rectal cancer, COPD, ulcer, DM2, CHF, pancreatitis, and depression.  Pt said she woke up this am around 0400 with severe abd pain.  Pt last had chemo on 11/7.  Pt denies fevers.       Home Medications Prior to Admission medications   Medication Sig Start Date End Date Taking? Authorizing Provider  oxyCODONE-acetaminophen (PERCOCET/ROXICET) 5-325 MG tablet Take 1 tablet by mouth every 6 (six) hours as needed for severe pain (pain score 7-10). 05/28/23  Yes Jacalyn Lefevre, MD  albuterol (VENTOLIN HFA) 108 (90 Base) MCG/ACT inhaler Inhale 2 puffs into the lungs every 6 (six) hours as needed for wheezing or shortness of breath.     [provider]  amoxicillin-clavulanate (AUGMENTIN) 875-125 MG tablet Take 1 tablet by mouth 2 (two) times daily. 05/26/23   Carlean Jews, NP  Blood Glucose Monitoring Suppl (ACCU-CHEK AVIVA PLUS) w/Device KIT See admin instructions. 10/20/17   [provider]  busPIRone (BUSPAR) 30 MG tablet Take 30 mg by mouth 2 (two) times daily. PER PT STATES HER DOSE WAS INCREASED 02/14/20   [provider]  clonazePAM (KLONOPIN) 0.5 MG tablet Take 0.5 mg by mouth at bedtime as needed for anxiety. 03/12/20   [provider]  diltiazem (CARDIZEM CD) 120 MG 24 hr capsule TAKE 1 CAPSULE BY MOUTH EVERY DAY 04/22/23   Gollan, Tollie Pizza, MD  diltiazem (CARDIZEM) 30 MG tablet TAKE 1 TABLET(30 MG) BY MOUTH THREE TIMES DAILY AS NEEDED 12/03/22   Antonieta Iba, MD  DULoxetine (CYMBALTA) 60 MG capsule Take 60 mg by mouth 2 (two) times daily.     [provider]  ezetimibe (ZETIA) 10 MG tablet Take 10 mg by mouth at bedtime. 02/16/23   [provider]  fluconazole (DIFLUCAN) 150 MG tablet Take 1 tablet po one time for yeast infection/thrush. May repeat dose in 3 days for persistent symptoms. 05/26/23   Carlean Jews, NP  Fluticasone-Salmeterol (ADVAIR) 250-50 MCG/DOSE AEPB Inhale 1 puff into the lungs 2 (two) times daily.    [provider]  furosemide (LASIX) 40 MG tablet TAKE 1 TABLET BY MOUTH EVERY DAY. MAY TAKE ADDITIONAL TABLET IN THE AM FOR WEIGHT GAIN AS NEEDED 03/10/23   Antonieta Iba, MD  JARDIANCE 25 MG TABS tablet Take 25 mg by mouth daily. 08/18/21   [provider]  lidocaine-prilocaine (EMLA) cream Apply to affected area once 02/17/23   Malachy Mood, MD  magic mouthwash (nystatin, lidocaine, diphenhydrAMINE, alum & mag hydroxide) suspension Take 5 mLs by mouth 4 (four) times daily as needed for mouth pain. 04/14/23   Malachy Mood, MD  metFORMIN (GLUCOPHAGE-XR) 500 MG 24 hr tablet Take 1,000 mg by mouth 2 (two) times daily. 10/21/22   [provider]  ondansetron (ZOFRAN) 8 MG tablet Take 1 tablet (8 mg total) by mouth every 8 (eight) hours as needed for nausea, vomiting or refractory nausea / vomiting. Start on the third day after chemotherapy. 02/17/23   Malachy Mood, MD  Oxycodone HCl 10 MG TABS Take 1 tablet (10 mg total) by mouth 3 (three) times daily as needed. 04/28/23   Pickenpack-Cousar, Arty Baumgartner,  NP  OXYGEN Inhale 2 L into the lungs at bedtime. Or as needed throughout the day if short of breath    [provider]  pantoprazole (PROTONIX) 40 MG tablet Take 1 tablet (40 mg total) by mouth daily. 05/04/18   Darci Current, MD  potassium chloride SA (KLOR-CON) 20 MEQ tablet Take 20 mEq by mouth 2 (two) times daily. 02/15/20   [provider]  pregabalin (LYRICA) 150 MG capsule Take 1 capsule (150 mg total) by mouth 3 (three) times daily. 05/16/23   Pickenpack-Cousar, Arty Baumgartner, NP  prochlorperazine (COMPAZINE) 10 MG tablet Take 1 tablet (10 mg total) by mouth every 6 (six) hours as  needed for nausea or vomiting. 02/17/23   Malachy Mood, MD  spironolactone (ALDACTONE) 25 MG tablet TAKE 1 TABLET(25 MG) BY MOUTH EVERY MORNING 05/23/23   Gollan, Tollie Pizza, MD  sucralfate (CARAFATE) 1 G tablet Take 1 tablet (1 g total) by mouth 4 (four) times daily -  with meals and at bedtime. Patient taking differently: Take 1 g by mouth 2 (two) times daily. 06/23/15   Shaune Pollack, MD  tiotropium (SPIRIVA) 18 MCG inhalation capsule Place 18 mcg into inhaler and inhale daily.    [provider]  tiZANidine (ZANAFLEX) 4 MG tablet Take 8 mg by mouth 3 (three) times daily. 09/14/21   [provider]  Varenicline Tartrate, Starter, 0.5 MG X 11 & 1 MG X 42 TBPK See admin instructions. follow package directions 02/16/23   [provider]  zolpidem (AMBIEN) 10 MG tablet Take 10 mg by mouth at bedtime as needed for sleep. 12/01/22   [provider]      Allergies    Tramadol and Penicillins    Review of Systems   Review of Systems  Gastrointestinal:  Positive for abdominal pain.  All other systems reviewed and are negative.   Physical Exam Updated Vital Signs BP (!) 189/102   Pulse (!) 101   Temp 98 F (36.7 C) (Oral)   Resp (!) 24   Ht 5\' 8"  (1.727 m)   Wt 79.4 kg   LMP 03/13/2015 (Approximate) Comment: more than 2 years ago  SpO2 91%   BMI 26.61 kg/m  Physical Exam Vitals and nursing note reviewed.  Constitutional:      Appearance: She is well-developed.  HENT:     Head: Normocephalic and atraumatic.     Mouth/Throat:     Mouth: Mucous membranes are moist.     Pharynx: Oropharynx is clear.  Eyes:     Extraocular Movements: Extraocular movements intact.     Pupils: Pupils are equal, round, and reactive to light.  Cardiovascular:     Rate and Rhythm: Normal rate and regular rhythm.     Heart sounds: Normal heart sounds.  Pulmonary:     Effort: Pulmonary effort is normal.     Breath sounds: Normal breath sounds.  Abdominal:     General: Abdomen is  flat. Bowel sounds are normal.     Palpations: Abdomen is soft.     Tenderness: There is generalized abdominal tenderness.  Skin:    General: Skin is warm.     Capillary Refill: Capillary refill takes less than 2 seconds.  Neurological:     General: No focal deficit present.     Mental Status: She is alert and oriented to person, place, and time.  Psychiatric:        Mood and Affect: Mood normal.  Behavior: Behavior normal.     ED Results / Procedures / Treatments   Labs (all labs ordered are listed, but only abnormal results are displayed) Labs Reviewed  COMPREHENSIVE METABOLIC PANEL - Abnormal; Notable for the following components:      Result Value   Sodium 134 (*)    Glucose, Bld 176 (*)    All other components within normal limits  LIPASE, BLOOD - Abnormal; Notable for the following components:   Lipase 540 (*)    All other components within normal limits  CBC WITH DIFFERENTIAL/PLATELET - Abnormal; Notable for the following components:   WBC 13.4 (*)    RDW 21.6 (*)    Neutro Abs 12.5 (*)    Lymphs Abs 0.5 (*)    All other components within normal limits  URINALYSIS, ROUTINE W REFLEX MICROSCOPIC - Abnormal; Notable for the following components:   APPearance HAZY (*)    Glucose, UA >=500 (*)    Hgb urine dipstick SMALL (*)    Ketones, ur 5 (*)    Leukocytes,Ua LARGE (*)    Bacteria, UA RARE (*)    All other components within normal limits    EKG None  Radiology CT ABDOMEN PELVIS W CONTRAST  Result Date: 05/28/2023 CLINICAL DATA:  Acute abdominal pain EXAM: CT ABDOMEN AND PELVIS WITH CONTRAST TECHNIQUE: Multidetector CT imaging of the abdomen and pelvis was performed using the standard protocol following bolus administration of intravenous contrast. RADIATION DOSE REDUCTION: This exam was performed according to the departmental dose-optimization program which includes automated exposure control, adjustment of the mA and/or kV according to patient size and/or  use of iterative reconstruction technique. CONTRAST:  OMNIPAQUE IOHEXOL 300 MG/ML  SOLN COMPARISON:  CT chest abdomen and pelvis 05/13/2023 FINDINGS: Lower chest: There is atelectasis in the lung bases. Hepatobiliary: There is diffuse fatty infiltration of the liver. Gallbladder and bile ducts are within limits. Pancreas: There is fluid and stranding surrounding the entire pancreas compatible with acute pancreatitis. No organized fluid collections are seen. Pancreas enhances normally. There is no ductal dilatation. Spleen: Normal in size without focal abnormality. Adrenals/Urinary Tract: Left adrenal nodule measuring 13 mm appears unchanged from prior. Right adrenal nodule measuring 11 mm is unchanged from prior. The bladder is distended, but otherwise within normal limits. The bilateral kidneys are within normal limits. Stomach/Bowel: Stomach is within normal limits. Appendix appears normal. No evidence of bowel wall thickening, distention, or inflammatory changes. There is a large amount of stool throughout the colon. Vascular/Lymphatic: No enlarged lymph nodes are seen. Aorta and IVC are normal in size. There are atherosclerotic calcifications of the aorta. Prominent periaortic lymph nodes are similar to the prior study. There also nonenlarged retrocrural and periportal lymph nodes, unchanged from prior. Reproductive: Uterus and bilateral adnexa are unremarkable. Other: No abdominal wall hernia. There is mild body wall edema. No abdominopelvic ascites. Musculoskeletal: Multilevel degenerative changes affect the spine. IMPRESSION: 1. Findings compatible with acute pancreatitis. No organized fluid collections. 2. Fatty infiltration of the liver. 3. Stable bilateral adrenal nodules. 4. Mild body wall edema. Aortic Atherosclerosis (ICD10-I70.0). Electronically Signed   By: Darliss Cheney M.D.   On: 05/28/2023 19:23    Procedures Procedures    Medications Ordered in ED Medications  morphine (PF) 4 MG/ML  injection 4 mg (has no administration in time range)  morphine (PF) 4 MG/ML injection 4 mg (4 mg Intravenous Given 05/28/23 1504)  ondansetron (ZOFRAN) injection 4 mg (4 mg Intravenous Given 05/28/23 1503)  sodium chloride  0.9 % bolus 1,000 mL (1,000 mLs Intravenous New Bag/Given 05/28/23 1503)  iohexol (OMNIPAQUE) 300 MG/ML solution 100 mL (100 mLs Intravenous Contrast Given 05/28/23 1714)    ED Course/ Medical Decision Making/ A&P                                 Medical Decision Making Amount and/or Complexity of Data Reviewed Labs: ordered. Radiology: ordered.  Risk Prescription drug management.   This patient presents to the ED for concern of abdominal pain, this involves an extensive number of treatment options, and is a complaint that carries with it a high risk of complications and morbidity.  The differential diagnosis includes cholelithiasis, diverticulitis, pancreatitis, gastritis   Co morbidities that complicate the patient evaluation  metastatic rectal cancer, COPD, ulcer, DM2, CHF, pancreatitis, and depression   Additional history obtained:  Additional history obtained from epic chart review  Lab Tests:  I Ordered, and personally interpreted labs.  The pertinent results include:  cbc with wbc elevated at 13.4; cmp nl other than glucose elevated at 176, lip elevated at 540   Imaging Studies ordered:  I ordered imaging studies including ct abd/pelvis  I independently visualized and interpreted imaging which showed   Findings compatible with acute pancreatitis. No organized fluid  collections.  2. Fatty infiltration of the liver.  3. Stable bilateral adrenal nodules.  4. Mild body wall edema.    Aortic Atherosclerosis (ICD10-I70.0).   I agree with the radiologist interpretation   Cardiac Monitoring:  The patient was maintained on a cardiac monitor.  I personally viewed and interpreted the cardiac monitored which showed an underlying rhythm of:  nsr   Medicines ordered and prescription drug management:  I ordered medication including ivfs/zofran/morphine  for sx  Reevaluation of the patient after these medicines showed that the patient improved I have reviewed the patients home medicines and have made adjustments as needed   Test Considered:  ct   Critical Interventions:  Pain control   Problem List / ED Course:  Pancreatitis:  pt has had this in the past.  She is offered admission, but wants to go home with pain meds.  She has nausea meds at home.  She knows to return if worse.  F/u with pcp/onc.    Reevaluation:  After the interventions noted above, I reevaluated the patient and found that they have :improved   Social Determinants of Health:  Lives at home   Dispostion:  After consideration of the diagnostic results and the patients response to treatment, I feel that the patent would benefit from discharge with outpatient f/u.          Final Clinical Impression(s) / ED Diagnoses Final diagnoses:  Acute pancreatitis, unspecified complication status, unspecified pancreatitis type  Hypertension, unspecified type    Rx / DC Orders ED Discharge Orders          Ordered    oxyCODONE-acetaminophen (PERCOCET/ROXICET) 5-325 MG tablet  Every 6 hours PRN        05/28/23 1933              Jacalyn Lefevre, MD 05/28/23 1937

## 2023-05-28 NOTE — ED Notes (Signed)
RN informed patient urine sample was needed, clear understanding voiced by patient

## 2023-05-29 ENCOUNTER — Encounter: Payer: Self-pay | Admitting: Nurse Practitioner

## 2023-05-31 ENCOUNTER — Other Ambulatory Visit: Payer: Self-pay

## 2023-05-31 DIAGNOSIS — C20 Malignant neoplasm of rectum: Secondary | ICD-10-CM

## 2023-05-31 DIAGNOSIS — G893 Neoplasm related pain (acute) (chronic): Secondary | ICD-10-CM

## 2023-05-31 DIAGNOSIS — Z515 Encounter for palliative care: Secondary | ICD-10-CM

## 2023-05-31 MED ORDER — OXYCODONE HCL 10 MG PO TABS: 10.0000 mg | ORAL_TABLET | Freq: Three times a day (TID) | ORAL | 0 refills | Status: DC | PRN

## 2023-05-31 NOTE — Telephone Encounter (Signed)
Pt called for refill of her oxycodone as she did not pick up her percocets yet. Pt made aware to pick up oxycodone instead of percocet, pt agreeable to plan.

## 2023-06-06 ENCOUNTER — Ambulatory Visit: Payer: Medicaid Other | Admitting: Radiation Oncology

## 2023-06-06 NOTE — Progress Notes (Signed)
Palliative Medicine [facility]  Telephone:([phone] [phone]) [phone]   Name: Erica [name] Date: 06/06/2023 MRN: [id number]  DOB: [birth year]  Patient Care Team: System, Provider Not In as PCP - General Gollan, Tollie Pizza, MD as PCP - Cardiology (Cardiology) Delma Freeze, FNP as Nurse Practitioner (Family Medicine) Antonieta Iba, MD as Consulting Physician (Cardiology) Merwyn Katos, MD (Inactive) as Consulting Physician (Pulmonary Disease) Jim Like, RN as Registered Nurse Scarlett Presto, RN (Inactive) as Registered Nurse Benita Gutter, RN as Oncology Nurse Navigator Malachy Mood, MD as Consulting Physician (Oncology) Pickenpack-Cousar, Arty Baumgartner, NP as Nurse Practitioner (Hospice and Palliative Medicine)    INTERVAL HISTORY: Erica [name] is a 57 y.o. 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 Palliative ask to see for symptom management and goals of care.   SOCIAL HISTORY:     reports that she has been smoking cigarettes. She has a 138 pack-year smoking history. She has never used smokeless tobacco. She reports current alcohol use. She reports that she does not use drugs.  ADVANCE DIRECTIVES:  None on file  CODE STATUS: Full code  PAST MEDICAL HISTORY: Past Medical History:  Diagnosis Date   (HFpEF) heart failure with preserved ejection fraction (HCC)    a. 05/2015 Echo: EF 60-65%, no rwma, PASP .   Acute pancreatitis 08/13/2018   Anxiety    Arthritis    Asthma    Bell's palsy    no deficit   Bronchitis    Cancer (HCC) 12/2022   rectal cancer   CHF (congestive heart failure) (HCC)    COPD (chronic obstructive pulmonary disease) (HCC)    Depression    Diabetes mellitus without complication (HCC)    type II 03/2017   Dyspnea    Fatty liver    per patient   Gall stones    per patient   Gastric  ulcer    Hyperlipidemia    Hypertension    Lung nodule 12/2022   upper left   OSA (obstructive sleep apnea)    a. did not tolerate CPAP. On oxygen 2L via [place]   Pancreatitis    07/2018   Shoulder injury    6/19   Smoker     ALLERGIES:  is allergic to tramadol and penicillins.  MEDICATIONS:  Current Outpatient Medications  Medication Sig Dispense Refill   albuterol (VENTOLIN HFA) 108 (90 Base) MCG/ACT inhaler Inhale 2 puffs into the lungs every 6 (six) hours as needed for wheezing or shortness of breath.      amoxicillin-clavulanate (AUGMENTIN) 875-125 MG tablet Take 1 tablet by mouth 2 (two) times daily. 14 tablet 0   Blood Glucose Monitoring Suppl (ACCU-CHEK AVIVA PLUS) w/Device KIT See admin instructions.  0   busPIRone (BUSPAR) 30 MG tablet Take 30 mg by mouth 2 (two) times daily. PER PT STATES HER DOSE WAS INCREASED     clonazePAM (KLONOPIN) 0.5 MG tablet Take 0.5 mg by mouth at bedtime as needed for anxiety.     diltiazem (CARDIZEM CD) 120 MG 24 hr capsule TAKE 1 CAPSULE BY MOUTH EVERY DAY 180 capsule 2   diltiazem (CARDIZEM) 30 MG tablet TAKE 1 TABLET(30 MG) BY MOUTH THREE TIMES DAILY AS NEEDED 270 tablet 0   DULoxetine (CYMBALTA) 60 MG capsule Take 60 mg by mouth 2 (two) times daily.      ezetimibe (ZETIA) 10 MG tablet Take  10 mg by mouth at bedtime.     fluconazole (DIFLUCAN) 150 MG tablet Take 1 tablet po one time for yeast infection/thrush. May repeat dose in 3 days for persistent symptoms. 3 tablet 0   Fluticasone-Salmeterol (ADVAIR) 250-50 MCG/DOSE AEPB Inhale 1 puff into the lungs 2 (two) times daily.     furosemide (LASIX) 40 MG tablet TAKE 1 TABLET BY MOUTH EVERY DAY. MAY TAKE ADDITIONAL TABLET IN THE AM FOR WEIGHT GAIN AS NEEDED 110 tablet 2   JARDIANCE 25 MG TABS tablet Take 25 mg by mouth daily.     lidocaine-prilocaine (EMLA) cream Apply to affected area once 30 g 3   magic mouthwash (nystatin, lidocaine, diphenhydrAMINE, alum & mag hydroxide) suspension Take 5 mLs by  mouth 4 (four) times daily as needed for mouth pain. 140 mL 1   metFORMIN (GLUCOPHAGE-XR) 500 MG 24 hr tablet Take 1,000 mg by mouth 2 (two) times daily.     ondansetron (ZOFRAN) 8 MG tablet Take 1 tablet (8 mg total) by mouth every 8 (eight) hours as needed for nausea, vomiting or refractory nausea / vomiting. Start on the third day after chemotherapy. 30 tablet 1   Oxycodone HCl 10 MG TABS Take 1 tablet (10 mg total) by mouth 3 (three) times daily as needed. 90 tablet 0   OXYGEN Inhale 2 L into the lungs at bedtime. Or as needed throughout the day if short of breath     pantoprazole (PROTONIX) 40 MG tablet Take 1 tablet (40 mg total) by mouth daily. 30 tablet 0   potassium chloride SA (KLOR-CON) 20 MEQ tablet Take 20 mEq by mouth 2 (two) times daily.     pregabalin (LYRICA) 150 MG capsule Take 1 capsule (150 mg total) by mouth 3 (three) times daily. 90 capsule 0   prochlorperazine (COMPAZINE) 10 MG tablet Take 1 tablet (10 mg total) by mouth every 6 (six) hours as needed for nausea or vomiting. 30 tablet 1   spironolactone (ALDACTONE) 25 MG tablet TAKE 1 TABLET(25 MG) BY MOUTH EVERY MORNING 90 tablet 3   sucralfate (CARAFATE) 1 G tablet Take 1 tablet (1 g total) by mouth 4 (four) times daily -  with meals and at bedtime. (Patient taking differently: Take 1 g by mouth 2 (two) times daily.) 120 tablet 0   tiotropium (SPIRIVA) 18 MCG inhalation capsule Place 18 mcg into inhaler and inhale daily.     tiZANidine (ZANAFLEX) 4 MG tablet Take 8 mg by mouth 3 (three) times daily.     Varenicline Tartrate, Starter, 0.5 MG X 11 & 1 MG X 42 TBPK See admin instructions. follow package directions     zolpidem (AMBIEN) 10 MG tablet Take 10 mg by mouth at bedtime as needed for sleep.     No current facility-administered medications for this visit.    VITAL SIGNS: LMP 03/13/2015 (Approximate) Comment: more than 2 years ago There were no vitals filed for this visit.  Estimated body mass index is 26.61 kg/m as  calculated from the following:   Height as of 05/28/23: 5\' 8"  (1.727 m).   Weight as of 05/28/23: 175 lb (79.4 kg).   PERFORMANCE STATUS (ECOG) : 1 - Symptomatic but completely ambulatory   Physical Exam General: NAD Cardiovascular: regular rate and rhythm Pulmonary: clear ant fields Abdomen: soft, nontender, + bowel sounds Extremities: no edema, no joint deformities Skin: no rashes Neurological: AAO x3  IMPRESSION: I saw Ms. Teegarden during her infusion. No acute distress. Patient states she  is doing well overall. Some delays with treatment due to difficulty with blood return from port. Pending cathflo results. She denies nausea, vomiting, constipation.  Appetite fluctuates.  Some days are better than others.    Her pain is well control on current regimen. Does not require oxycodone around the clock. Lyrica as prescribed. She is much appreciative of how well she has been feeling.   No changes to regimen needed at this time. All symptoms well managed.    Goals of care  03/31/23- We discussed her current illness and what it means in the larger context of her on-going co-morbidities. Natural disease trajectory and expectations were discussed.   Ms. Arnott is realistic in her understanding.  Verbalize goal is to continue to treat the treatable allow her every opportunity to continue to thrive.  Is taking things oneday at a time.  We discussed Her current illness and what it means in the larger context of Her on-going co-morbidities. Natural disease trajectory and expectations were discussed.  I discussed the importance of continued conversation with family and their medical providers regarding overall plan of care and treatment options, ensuring decisions are within the context of the patients values and GOCs.  PLAN: Oxycodone 10 mg every 8 hours as needed. Lyrica 150 mg 3 times daily Pain contract on file. MiraLAX twice daily for bowel regimen Ongoing symptom management support. I  will plan to see patient back in 3-4 weeks in collaboration to other oncology appointments.   Patient expressed understanding and was in agreement with this plan. She also understands that She can call the clinic at any time with any questions, concerns, or complaints.   Any controlled substances utilized were prescribed in the context of palliative care. PDMP has been reviewed.    Visit consisted of counseling and education dealing with the complex and emotionally intense issues of symptom management and palliative care in the setting of serious and potentially life-threatening illness.  Willette Alma, AGPCNP-BC  Palliative Medicine Team/[facility]  *Please note that this is a verbal dictation therefore any spelling or grammatical errors are due to the "[employer]" system interpretation.

## 2023-06-08 ENCOUNTER — Other Ambulatory Visit: Payer: Self-pay

## 2023-06-08 DIAGNOSIS — C20 Malignant neoplasm of rectum: Secondary | ICD-10-CM

## 2023-06-09 ENCOUNTER — Encounter: Payer: Self-pay | Admitting: Hematology

## 2023-06-09 ENCOUNTER — Inpatient Hospital Stay: Payer: Medicaid Other | Admitting: Hematology

## 2023-06-09 ENCOUNTER — Inpatient Hospital Stay (HOSPITAL_BASED_OUTPATIENT_CLINIC_OR_DEPARTMENT_OTHER): Payer: Medicaid Other | Admitting: Nurse Practitioner

## 2023-06-09 ENCOUNTER — Inpatient Hospital Stay: Payer: Medicaid Other

## 2023-06-09 VITALS — BP 132/68 | HR 91 | Temp 98.3°F | Resp 18 | Ht 68.0 in | Wt 157.3 lb

## 2023-06-09 DIAGNOSIS — Z515 Encounter for palliative care: Secondary | ICD-10-CM

## 2023-06-09 DIAGNOSIS — C20 Malignant neoplasm of rectum: Secondary | ICD-10-CM

## 2023-06-09 DIAGNOSIS — R530 Neoplastic (malignant) related fatigue: Secondary | ICD-10-CM

## 2023-06-09 DIAGNOSIS — G893 Neoplasm related pain (acute) (chronic): Secondary | ICD-10-CM | POA: Diagnosis not present

## 2023-06-09 DIAGNOSIS — Z95828 Presence of other vascular implants and grafts: Secondary | ICD-10-CM

## 2023-06-09 DIAGNOSIS — Z5112 Encounter for antineoplastic immunotherapy: Secondary | ICD-10-CM | POA: Diagnosis not present

## 2023-06-09 LAB — CMP (CANCER CENTER ONLY)
ALT: 16 U/L (ref 0–44)
AST: 12 U/L — ABNORMAL LOW (ref 15–41)
Albumin: 3.8 g/dL (ref 3.5–5.0)
Alkaline Phosphatase: 150 U/L — ABNORMAL HIGH (ref 38–126)
Anion gap: 8 (ref 5–15)
BUN: 7 mg/dL (ref 6–20)
CO2: 28 mmol/L (ref 22–32)
Calcium: 9.7 mg/dL (ref 8.9–10.3)
Chloride: 101 mmol/L (ref 98–111)
Creatinine: 0.76 mg/dL (ref 0.44–1.00)
GFR, Estimated: >60 mL/min (ref >60)
Glucose, Bld: 209 mg/dL — ABNORMAL HIGH (ref 70–99)
Potassium: 4.4 mmol/L (ref 3.5–5.1)
Sodium: 137 mmol/L (ref 135–145)
Total Bilirubin: 0.3 mg/dL (ref <1.2)
Total Protein: 7.4 g/dL (ref 6.5–8.1)

## 2023-06-09 LAB — CBC WITH DIFFERENTIAL (CANCER CENTER ONLY)
Abs Immature Granulocytes: 0.05 10*3/uL (ref 0.00–0.07)
Basophils Absolute: 0.1 10*3/uL (ref 0.0–0.1)
Basophils Relative: 1 %
Eosinophils Absolute: 0.4 10*3/uL (ref 0.0–0.5)
Eosinophils Relative: 4 %
HCT: 41.9 % (ref 36.0–46.0)
Hemoglobin: 13.4 g/dL (ref 12.0–15.0)
Immature Granulocytes: 1 %
Lymphocytes Relative: 20 %
Lymphs Abs: 1.8 10*3/uL (ref 0.7–4.0)
MCH: 30.9 pg (ref 26.0–34.0)
MCHC: 32 g/dL (ref 30.0–36.0)
MCV: 96.5 fL (ref 80.0–100.0)
Monocytes Absolute: 0.6 10*3/uL (ref 0.1–1.0)
Monocytes Relative: 7 %
Neutro Abs: 6.1 10*3/uL (ref 1.7–7.7)
Neutrophils Relative %: 67 %
Platelet Count: 248 10*3/uL (ref 150–400)
RBC: 4.34 MIL/uL (ref 3.87–5.11)
RDW: 19.2 % — ABNORMAL HIGH (ref 11.5–15.5)
WBC Count: 9 10*3/uL (ref 4.0–10.5)
nRBC: 0 % (ref 0.0–0.2)

## 2023-06-09 MED ORDER — ATROPINE SULFATE 1 MG/ML IV SOLN
0.5000 mg | Freq: Once | INTRAVENOUS | Status: AC | PRN
  Administered 2023-06-09: 0.5 mg via INTRAVENOUS
  Filled 2023-06-09: qty 1

## 2023-06-09 MED ORDER — ALTEPLASE 2 MG IJ SOLR
2.0000 mg | Freq: Once | INTRAMUSCULAR | Status: AC
  Administered 2023-06-09: 2 mg
  Filled 2023-06-09: qty 2

## 2023-06-09 MED ORDER — DEXAMETHASONE SODIUM PHOSPHATE 10 MG/ML IJ SOLN
10.0000 mg | Freq: Once | INTRAMUSCULAR | Status: AC
  Administered 2023-06-09: 10 mg via INTRAVENOUS
  Filled 2023-06-09: qty 1

## 2023-06-09 MED ORDER — SODIUM CHLORIDE 0.9 % IV SOLN: Freq: Once | INTRAVENOUS | Status: AC

## 2023-06-09 MED ORDER — SODIUM CHLORIDE 0.9 % IV SOLN
180.0000 mg/m2 | Freq: Once | INTRAVENOUS | Status: AC
  Administered 2023-06-09: 340 mg via INTRAVENOUS
  Filled 2023-06-09: qty 12

## 2023-06-09 MED ORDER — PALONOSETRON HCL INJECTION 0.25 MG/5ML
0.2500 mg | Freq: Once | INTRAVENOUS | Status: AC
Start: 2023-06-09 — End: 2023-06-09
  Administered 2023-06-09: 0.25 mg via INTRAVENOUS
  Filled 2023-06-09: qty 5

## 2023-06-09 MED ORDER — SODIUM CHLORIDE 0.9 % IV SOLN
5.0000 mg/kg | Freq: Once | INTRAVENOUS | Status: AC
  Administered 2023-06-09: 400 mg via INTRAVENOUS
  Filled 2023-06-09: qty 16

## 2023-06-09 MED ORDER — FLUOROURACIL CHEMO INJECTION 5 GM/100ML
2200.0000 mg/m2 | INTRAVENOUS | Status: DC
  Administered 2023-06-09: 4200 mg via INTRAVENOUS
  Filled 2023-06-09: qty 84

## 2023-06-09 MED ORDER — SODIUM CHLORIDE 0.9 % IV SOLN
400.0000 mg/m2 | Freq: Once | INTRAVENOUS | Status: AC
  Administered 2023-06-09: 760 mg via INTRAVENOUS
  Filled 2023-06-09: qty 17.5

## 2023-06-09 NOTE — Progress Notes (Signed)
Ok per Dr Mosetta Putt to speed up pump to 3.52mL/h to have pump run over 44 hrs.

## 2023-06-09 NOTE — Progress Notes (Signed)
[facility]   Telephone:([phone]) [phone] [phone]   Clinic Follow up Note   Patient Care Team: System, Provider Not In as PCP - General Gollan, Tollie Pizza, MD as PCP - Cardiology (Cardiology) Delma Freeze, FNP as Nurse Practitioner (Family Medicine) Mariah Milling, Tollie Pizza, MD as Consulting Physician (Cardiology) Merwyn Katos, MD (Inactive) as Consulting Physician (Pulmonary Disease) Jim Like, RN as Registered Nurse Scarlett Presto, RN (Inactive) as Registered Nurse Benita Gutter, RN as Oncology Nurse Navigator Malachy Mood, MD as Consulting Physician (Oncology) Pickenpack-Cousar, Arty Baumgartner, NP as Nurse Practitioner [facility])  Date of Service:  06/09/2023  CHIEF COMPLAINT: f/u of rectal cancer  CURRENT THERAPY:  Neoadjuvant chemotherapy FOLFOX  Oncology History   Rectal cancer (HCC) cT3cN2M1, stage IV,with oligo lung metastasis, intact MMR, KRAS G12V (+) -We reviewed her medical record and workup in detail with the patient and her daughter.  She presented with hematochezia since 05/2022 -Workup showed a near obstructing rectal mass. Path confirmed adenocarcinoma. Baseline CEA elevated to 18.1.  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  I added bevacizumab to her chemo from cycle 3 -restaging CT 11/9 showed good PR, will continue for a total of 8 cycle chemo then proceed with concurrent chemoRT to rectal cancer and SBRT to lung met      Assessment and Plan    Rectal Cancer 57 year old with rectal cancer on cycle 7 of intravenous chemotherapy, one cycle remaining. Reports hair loss, mild nausea, weight loss likely  due to decreased appetite. No fever, chills, significant pain beyond chronic issues, or new neuropathy. Blood counts normal, liver function tests pending. Discussed transition to radiation therapy post-chemotherapy with addition of chemotherapy pills. Emphasized nutritional supplements for weight management. - Complete current cycle of intravenous chemotherapy - Schedule radiation therapy post-chemotherapy - Order chemotherapy pills for concurrent use with radiation - Ensure follow-up with Dr. 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 - Monitor for changes in neuropathy symptoms  Chronic Pain Chronic pain in back, left hip, knees, and neck, ongoing for years. - Continue current pain management regimen.     Plan -Lab reviewed, adequate for treatment, will proceed cycle 7 FOLFOX -Lab, follow-up in the last cycle FOLFOX in 2 weeks, ordered Xeloda on next visit    SUMMARY OF ONCOLOGIC HISTORY: Oncology History Overview Note   Cancer Staging  Rectal cancer [facility]) Staging form: Colon and Rectum, AJCC 8th Edition - Clinical stage from 12/23/2022: Stage IVA (cT3, cN2, pM1a) - Signed by Rickard Patience, MD on 02/04/2023 Stage prefix: Initial diagnosis     Rectal cancer (HCC)  12/15/2022 Pathology Results   Colonoscopy:   Positive for invasive adenocarcinoma, G1 well differentiated      12/15/2022 Procedure   Colonoscopy under the care of Dr. Sharlet Salina   Findings: -The perianal and digital rectal examinations were normal. -Extensive amounts of liquid semi- liquid semi- solid stool was found in the entire colon -A partially obstructing large mass was found in the rectum. The mass was circumferential.  12/23/2022 Initial Diagnosis   Rectal cancer [facility]) Patient was seen by gastroenterology due to  hematochezia as well as unintentional weight loss.  12/15/2022, colonoscopy showed fungating partially obstructing large mass in the rectum, circumferential, mass extended from about 15 cm from anus to about 8 cm from anal verge.  Biopsy was taken. Poor preparation of colon.  Colonoscopy was repeated on 12/16/2022, preparations inadequate.  Pathology showed invasive adenocarcinoma.   12/23/2022 Cancer Staging   Staging form: Colon and Rectum, AJCC 8th Edition - Clinical stage from 12/23/2022: Stage Unknown (cTX, cNX) - Signed by Rickard Patience, MD on 12/23/2022 Stage prefix: Initial diagnosis  Staging form: Colon and Rectum, AJCC 8th Edition - Clinical stage from 12/23/2022: Stage IVA (cT3, cN2, pM1a) - Signed by Rickard Patience, MD on 02/04/2023 Stage prefix: Initial diagnosis   12/23/2022 Tumor Marker   Patient's tumor was tested for the following markers: CEA. Results of the tumor marker test revealed elevated at 18.1.   01/03/2023 Imaging   CT chest abdomen with contrast showed 2.2 cm left upper lobe nodule, suspicious for metastasis. No findings suspicious for metastatic disease in the abdomen. Bilateral adrenal adenomas, benign. Cholelithiasis, without associated inflammatory changes. Aortic Atherosclerosis (ICD10-I70.0) and Emphysema   01/08/2023 Imaging   MRI pelvis without contrast-rectal protocol showed 9.2 cm concentric mid/lower rectal mass, corresponding to the patient's known primary rectal adenocarcinoma, as above. Rectal adenocarcinoma T stage: T3c Rectal adenocarcinoma N stage:  N2 Distance from tumor to the internal anal sphincter is 3.4 cm.   01/27/2023 Procedure   Patient underwent biopsy via bronchoscopy by pulmonology. Biopsy pathology showed adenocarcinoma, Immunohistochemistry is positive with cytokeratin 20 and CDX2 and negative with cytokeratin 7, TTF-1 and napsin A consistent with colonic adenocarcinoma    01/27/2023 Pathology Results   CASE: [id number]   FINAL MICROSCOPIC  DIAGNOSIS:  A. LUNG, LUL, FINE NEEDLE ASPIRATION:  Adenocarcinoma  See comment   ADDENDUM:  Immunohistochemistry is positive with cytokeratin 20 and CDX2  and negative with cytokeratin 7, TTF-1 and napsin A consistent with  colonic adenocarcinoma.    02/28/2023 Miscellaneous      03/03/2023 -  Chemotherapy   Patient is on Treatment Plan : COLORECTAL FOLFIRI q14d     05/13/2023 Imaging   CT chest, abdomen, and pelvis with contrast IMPRESSION: 1. Response to therapy of rectal primary, pelvic nodal, and isolated pulmonary metastasis. 2. Moderate amount of stool upstream from the rectal primary. Partial obstruction cannot be excluded. 3. No new or progressive disease. 4. Right Port-A-Cath with anterior SVC pericatheter small volume thrombus. 5. Age advanced coronary artery atherosclerosis. Recommend assessment of coronary risk factors. 6. Incidental findings, including: Cholelithiasis. Aortic Atherosclerosis (ICD10-I70.0).      Discussed the use of AI scribe software for clinical note transcription with the patient, who gave verbal consent to proceed.  History of Present Illness   The patient, a 58 year old with a history of rectal cancer, presents for a routine follow-up. She reports no new symptoms or changes in her condition. She has been experiencing some hair loss, which she attributes to her ongoing chemotherapy treatment. She has had some difficulty with her port, but was still able to receive her medication. She reports occasional nausea, but it is not severe enough to require medication and does not last long. She has noticed some weight loss and acknowledges that she needs to eat more. She has been supplementing her diet with a nutritional drink. She reports no fever, chills, or new pain, but does have chronic back  and hip pain. She also reports a history of neuropathy in her hands and feet, which has not worsened.         All other systems were reviewed with the patient and  are negative.  MEDICAL HISTORY:  Past Medical History:  Diagnosis Date   (HFpEF) heart failure with preserved ejection fraction (HCC)    a. 05/2015 Echo: EF 60-65%, no rwma, PASP .   Acute pancreatitis 08/13/2018   Anxiety    Arthritis    Asthma    Bell's palsy    no deficit   Bronchitis    Cancer (HCC) 12/2022   rectal cancer   CHF (congestive heart failure) (HCC)    COPD (chronic obstructive pulmonary disease) (HCC)    Depression    Diabetes mellitus without complication (HCC)    type II 03/2017   Dyspnea    Fatty liver    per patient   Gall stones    per patient   Gastric ulcer    Hyperlipidemia    Hypertension    Lung nodule 12/2022   upper left   OSA (obstructive sleep apnea)    a. did not tolerate CPAP. On oxygen 2L via [place]   Pancreatitis    07/2018   Shoulder injury    6/19   Smoker     SURGICAL HISTORY: Past Surgical History:  Procedure Laterality Date   BRONCHIAL NEEDLE ASPIRATION BIOPSY  01/27/2023   Procedure: BRONCHIAL NEEDLE ASPIRATION BIOPSIES;  Surgeon: Raechel Chute, MD;  Location: [facility] ENDOSCOPY;  Service: Pulmonary;;   COLONOSCOPY WITH PROPOFOL N/A 12/15/2022   Procedure: COLONOSCOPY WITH PROPOFOL;  Surgeon: Wyline Mood, MD;  Location: [facility] ENDOSCOPY;  Service: Gastroenterology;  Laterality: N/A;   COLONOSCOPY WITH PROPOFOL N/A 12/16/2022   Procedure: COLONOSCOPY WITH PROPOFOL;  Surgeon: Wyline Mood, MD;  Location: [facility] ENDOSCOPY;  Service: Gastroenterology;  Laterality: N/A;   FLEXIBLE BRONCHOSCOPY N/A 06/20/2015   Procedure: FLEXIBLE BRONCHOSCOPY;  Surgeon: Shane Crutch, MD;  Location: [facility] ORS;  Service: Pulmonary;  Laterality: N/A;   IR IMAGING GUIDED PORT INSERTION  02/25/2023   KNEE SURGERY Left    8 knee surgeries    I have reviewed the social history and family history with the patient and they are unchanged from previous note.  ALLERGIES:  is allergic to tramadol and penicillins.  MEDICATIONS:  Current Outpatient Medications   Medication Sig Dispense Refill   albuterol (VENTOLIN HFA) 108 (90 Base) MCG/ACT inhaler Inhale 2 puffs into the lungs every 6 (six) hours as needed for wheezing or shortness of breath.      amoxicillin-clavulanate (AUGMENTIN) 875-125 MG tablet Take 1 tablet by mouth 2 (two) times daily. 14 tablet 0   Blood Glucose Monitoring Suppl (ACCU-CHEK AVIVA PLUS) w/Device KIT See admin instructions.  0   busPIRone (BUSPAR) 30 MG tablet Take 30 mg by mouth 2 (two) times daily. PER PT STATES HER DOSE WAS INCREASED     clonazePAM (KLONOPIN) 0.5 MG tablet Take 0.5 mg by mouth at bedtime as needed for anxiety.     diltiazem (CARDIZEM CD) 120 MG 24 hr capsule TAKE 1 CAPSULE BY MOUTH EVERY DAY 180 capsule 2   diltiazem (CARDIZEM) 30 MG tablet TAKE 1 TABLET(30 MG) BY MOUTH THREE TIMES DAILY AS NEEDED 270 tablet 0   DULoxetine (CYMBALTA) 60 MG capsule Take 60 mg by mouth 2 (two) times daily.      ezetimibe (ZETIA) 10 MG tablet Take 10 mg by mouth at bedtime.     fluconazole (  DIFLUCAN) 150 MG tablet Take 1 tablet po one time for yeast infection/thrush. May repeat dose in 3 days for persistent symptoms. 3 tablet 0   Fluticasone-Salmeterol (ADVAIR) 250-50 MCG/DOSE AEPB Inhale 1 puff into the lungs 2 (two) times daily.     furosemide (LASIX) 40 MG tablet TAKE 1 TABLET BY MOUTH EVERY DAY. MAY TAKE ADDITIONAL TABLET IN THE AM FOR WEIGHT GAIN AS NEEDED 110 tablet 2   JARDIANCE 25 MG TABS tablet Take 25 mg by mouth daily.     lidocaine-prilocaine (EMLA) cream Apply to affected area once 30 g 3   magic mouthwash (nystatin, lidocaine, diphenhydrAMINE, alum & mag hydroxide) suspension Take 5 mLs by mouth 4 (four) times daily as needed for mouth pain. 140 mL 1   metFORMIN (GLUCOPHAGE-XR) 500 MG 24 hr tablet Take 1,000 mg by mouth 2 (two) times daily.     ondansetron (ZOFRAN) 8 MG tablet Take 1 tablet (8 mg total) by mouth every 8 (eight) hours as needed for nausea, vomiting or refractory nausea / vomiting. Start on the third  day after chemotherapy. 30 tablet 1   Oxycodone HCl 10 MG TABS Take 1 tablet (10 mg total) by mouth 3 (three) times daily as needed. 90 tablet 0   OXYGEN Inhale 2 L into the lungs at bedtime. Or as needed throughout the day if short of breath     pantoprazole (PROTONIX) 40 MG tablet Take 1 tablet (40 mg total) by mouth daily. 30 tablet 0   potassium chloride SA (KLOR-CON) 20 MEQ tablet Take 20 mEq by mouth 2 (two) times daily.     pregabalin (LYRICA) 150 MG capsule Take 1 capsule (150 mg total) by mouth 3 (three) times daily. 90 capsule 0   prochlorperazine (COMPAZINE) 10 MG tablet Take 1 tablet (10 mg total) by mouth every 6 (six) hours as needed for nausea or vomiting. 30 tablet 1   spironolactone (ALDACTONE) 25 MG tablet TAKE 1 TABLET(25 MG) BY MOUTH EVERY MORNING 90 tablet 3   sucralfate (CARAFATE) 1 G tablet Take 1 tablet (1 g total) by mouth 4 (four) times daily -  with meals and at bedtime. (Patient taking differently: Take 1 g by mouth 2 (two) times daily.) 120 tablet 0   tiotropium (SPIRIVA) 18 MCG inhalation capsule Place 18 mcg into inhaler and inhale daily.     tiZANidine (ZANAFLEX) 4 MG tablet Take 8 mg by mouth 3 (three) times daily.     Varenicline Tartrate, Starter, 0.5 MG X 11 & 1 MG X 42 TBPK See admin instructions. follow package directions     zolpidem (AMBIEN) 10 MG tablet Take 10 mg by mouth at bedtime as needed for sleep.     No current facility-administered medications for this visit.    PHYSICAL EXAMINATION: ECOG PERFORMANCE STATUS: 1 - Symptomatic but completely ambulatory  Vitals:   06/09/23 1243  BP: 132/68  Pulse: 91  Resp: 18  Temp: 98.3 F (36.8 C)  SpO2: 98%   Wt Readings from Last 3 Encounters:  06/09/23 157 lb 4.8 oz (71.4 kg)  05/28/23 175 lb (79.4 kg)  05/26/23 177 lb (80.3 kg)     GENERAL:alert, no distress and comfortable SKIN: skin color, texture, turgor are normal, no rashes or significant lesions EYES: normal, Conjunctiva are pink and  non-injected, sclera clear NECK: supple, thyroid normal size, non-tender, without nodularity LYMPH:  no palpable lymphadenopathy in the cervical, axillary  LUNGS: clear to auscultation and percussion with normal breathing effort HEART: regular  rate & rhythm and no murmurs and no lower extremity edema ABDOMEN:abdomen soft, non-tender and normal bowel sounds Musculoskeletal:no cyanosis of digits and no clubbing  NEURO: alert & oriented x 3 with fluent speech, no focal motor/sensory deficits   LABORATORY DATA:  I have reviewed the data as listed    Latest Ref Rng & Units 06/09/2023   12:23 PM 05/28/2023    2:40 PM 05/26/2023   11:32 AM  CBC  WBC 4.0 - 10.5 K/uL 9.0  13.4  9.0   Hemoglobin 12.0 - 15.0 g/dL 40.9  81.1  91.4   Hematocrit 36.0 - 46.0 % 41.9  43.1  39.3   Platelets 150 - 400 K/uL 248  156  166         Latest Ref Rng & Units 05/28/2023    2:40 PM 05/26/2023   11:32 AM 04/28/2023   12:36 PM  CMP  Glucose 70 - 99 mg/dL 782  956  213   BUN 6 - 20 mg/dL 11  7  6    Creatinine 0.44 - 1.00 mg/dL 0.86  5.78  4.69   Sodium 135 - 145 mmol/L 134  139  137   Potassium 3.5 - 5.1 mmol/L 3.5  4.0  3.6   Chloride 98 - 111 mmol/L 98  105  105   CO2 22 - 32 mmol/L 25  28  24    Calcium 8.9 - 10.3 mg/dL 9.0  8.9  9.3   Total Protein 6.5 - 8.1 g/dL 7.5  6.4  7.1   Total Bilirubin <1.2 mg/dL 0.9  0.4  0.4   Alkaline Phos 38 - 126 U/L 63  63  123   AST 15 - 41 U/L 24  14  14    ALT 0 - 44 U/L 21  11  18        RADIOGRAPHIC STUDIES: I have personally reviewed the radiological images as listed and agreed with the findings in the report. No results found.    No orders of the defined types were placed in this encounter.  All questions were answered. The patient knows to call the clinic with any problems, questions or concerns. No barriers to learning was detected. The total time spent in the appointment was 25 minutes.     Malachy Mood, MD 06/09/2023

## 2023-06-09 NOTE — Patient Instructions (Signed)
[facility] - A DEPT OF [facility]  Discharge Instructions: Thank you for choosing [facility] to provide your oncology and hematology care.   If you have a lab appointment with the Cancer Center, please go directly to the Cancer Center and check in at the registration area.   Wear comfortable clothing and clothing appropriate for easy access to any Portacath or PICC line.   We strive to give you quality time with your provider. You may need to reschedule your appointment if you arrive late (15 or more minutes).  Arriving late affects you and other patients whose appointments are after yours.  Also, if you miss three or more appointments without notifying the office, you may be dismissed from the clinic at the provider's discretion.      For prescription refill requests, have your pharmacy contact our office and allow 72 hours for refills to be completed.    Today you received the following chemotherapy and/or immunotherapy agents Bevacizumab, Oxaliplatin, Leucovorin, Fluorouracil.      To help prevent nausea and vomiting after your treatment, we encourage you to take your nausea medication as directed.  BELOW ARE SYMPTOMS THAT SHOULD BE REPORTED IMMEDIATELY: *FEVER GREATER THAN 100.4 F (38 C) OR HIGHER *CHILLS OR SWEATING *NAUSEA AND VOMITING THAT IS NOT CONTROLLED WITH YOUR NAUSEA MEDICATION *UNUSUAL SHORTNESS OF BREATH *UNUSUAL BRUISING OR BLEEDING *URINARY PROBLEMS (pain or burning when urinating, or frequent urination) *BOWEL PROBLEMS (unusual diarrhea, constipation, pain near the anus) TENDERNESS IN MOUTH AND THROAT WITH OR WITHOUT PRESENCE OF ULCERS (sore throat, sores in mouth, or a toothache) UNUSUAL RASH, SWELLING OR PAIN  UNUSUAL VAGINAL DISCHARGE OR ITCHING   Items with * indicate a potential emergency and should be followed up as soon as possible or go to the Emergency Department if any problems should occur.  Please show the  CHEMOTHERAPY ALERT CARD or IMMUNOTHERAPY ALERT CARD at check-in to the Emergency Department and triage nurse.  Should you have questions after your visit or need to cancel or reschedule your appointment, please contact [facility] - A DEPT OF [facility]  Dept: [phone]  and follow the prompts.  Office hours are 8:00 a.m. to 4:30 p.m. Monday - Friday. Please note that voicemails left after 4:00 p.m. may not be returned until the following business day.  We are closed weekends and major holidays. You have access to a nurse at all times for urgent questions. Please call the main number to the clinic Dept: [phone] and follow the prompts.   For any non-urgent questions, you may also contact your provider using MyChart. We now offer e-Visits for anyone 71 and older to request care online for non-urgent symptoms. For details visit [email or web address].   Also download the MyChart app! Go to the app store, search "MyChart", open the app, select [facility], and log in with your MyChart username and password.

## 2023-06-09 NOTE — Assessment & Plan Note (Signed)
[id number], stage IV,with oligo lung metastasis, intact MMR, KRAS G12V (+) -We reviewed her medical record and workup in detail with the patient and her daughter.  She presented with hematochezia since 05/2022 -Workup showed a near obstructing rectal mass. Path confirmed adenocarcinoma. Baseline CEA elevated to 18.1.  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  I added bevacizumab to her chemo from cycle 3 -restaging CT 11/9 showed good PR, will continue for a total of 8 cycle chemo then proceed with concurrent chemoRT to rectal cancer and SBRT to lung met

## 2023-06-10 ENCOUNTER — Encounter: Payer: Self-pay | Admitting: Nurse Practitioner

## 2023-06-10 ENCOUNTER — Encounter: Payer: Self-pay | Admitting: Hematology

## 2023-06-11 ENCOUNTER — Inpatient Hospital Stay: Payer: Medicaid Other

## 2023-06-11 VITALS — BP 151/79 | HR 105 | Temp 97.2°F | Resp 20

## 2023-06-11 DIAGNOSIS — C20 Malignant neoplasm of rectum: Secondary | ICD-10-CM

## 2023-06-11 DIAGNOSIS — Z5112 Encounter for antineoplastic immunotherapy: Secondary | ICD-10-CM | POA: Diagnosis not present

## 2023-06-11 MED ORDER — PEGFILGRASTIM-FPGK 6 MG/0.6ML ~~LOC~~ SOSY
6.0000 mg | PREFILLED_SYRINGE | Freq: Once | SUBCUTANEOUS | Status: AC
Start: 2023-06-11 — End: 2023-06-11
  Administered 2023-06-11: 6 mg via SUBCUTANEOUS

## 2023-06-11 MED ORDER — HEPARIN SOD (PORK) LOCK FLUSH 100 UNIT/ML IV SOLN
500.0000 [IU] | Freq: Once | INTRAVENOUS | Status: AC | PRN
  Administered 2023-06-11: 500 [IU]

## 2023-06-11 MED ORDER — SODIUM CHLORIDE 0.9% FLUSH
10.0000 mL | INTRAVENOUS | Status: DC | PRN
Start: 2023-06-11 — End: 2023-06-11
  Administered 2023-06-11: 10 mL

## 2023-06-17 ENCOUNTER — Other Ambulatory Visit: Payer: Self-pay | Admitting: Cardiovascular Disease

## 2023-06-22 ENCOUNTER — Inpatient Hospital Stay: Payer: Medicaid Other | Attending: Oncology

## 2023-06-22 ENCOUNTER — Inpatient Hospital Stay: Payer: Medicaid Other

## 2023-06-22 ENCOUNTER — Other Ambulatory Visit: Payer: Self-pay

## 2023-06-22 ENCOUNTER — Telehealth: Payer: Self-pay

## 2023-06-22 ENCOUNTER — Inpatient Hospital Stay: Payer: Medicaid Other | Admitting: Hematology

## 2023-06-22 ENCOUNTER — Telehealth: Payer: Self-pay | Admitting: Hematology

## 2023-06-22 DIAGNOSIS — M1990 Unspecified osteoarthritis, unspecified site: Secondary | ICD-10-CM | POA: Insufficient documentation

## 2023-06-22 DIAGNOSIS — G893 Neoplasm related pain (acute) (chronic): Secondary | ICD-10-CM

## 2023-06-22 DIAGNOSIS — K5900 Constipation, unspecified: Secondary | ICD-10-CM | POA: Insufficient documentation

## 2023-06-22 DIAGNOSIS — C7800 Secondary malignant neoplasm of unspecified lung: Secondary | ICD-10-CM | POA: Insufficient documentation

## 2023-06-22 DIAGNOSIS — M792 Neuralgia and neuritis, unspecified: Secondary | ICD-10-CM

## 2023-06-22 DIAGNOSIS — Z452 Encounter for adjustment and management of vascular access device: Secondary | ICD-10-CM | POA: Insufficient documentation

## 2023-06-22 DIAGNOSIS — I5032 Chronic diastolic (congestive) heart failure: Secondary | ICD-10-CM | POA: Insufficient documentation

## 2023-06-22 DIAGNOSIS — E785 Hyperlipidemia, unspecified: Secondary | ICD-10-CM | POA: Insufficient documentation

## 2023-06-22 DIAGNOSIS — Z5112 Encounter for antineoplastic immunotherapy: Secondary | ICD-10-CM | POA: Insufficient documentation

## 2023-06-22 DIAGNOSIS — Z515 Encounter for palliative care: Secondary | ICD-10-CM | POA: Insufficient documentation

## 2023-06-22 DIAGNOSIS — Z5189 Encounter for other specified aftercare: Secondary | ICD-10-CM | POA: Insufficient documentation

## 2023-06-22 DIAGNOSIS — C20 Malignant neoplasm of rectum: Secondary | ICD-10-CM | POA: Insufficient documentation

## 2023-06-22 DIAGNOSIS — J4489 Other specified chronic obstructive pulmonary disease: Secondary | ICD-10-CM | POA: Insufficient documentation

## 2023-06-22 DIAGNOSIS — I110 Hypertensive heart disease with heart failure: Secondary | ICD-10-CM | POA: Insufficient documentation

## 2023-06-22 DIAGNOSIS — F419 Anxiety disorder, unspecified: Secondary | ICD-10-CM | POA: Insufficient documentation

## 2023-06-22 DIAGNOSIS — E11628 Type 2 diabetes mellitus with other skin complications: Secondary | ICD-10-CM | POA: Insufficient documentation

## 2023-06-22 DIAGNOSIS — Z5111 Encounter for antineoplastic chemotherapy: Secondary | ICD-10-CM | POA: Insufficient documentation

## 2023-06-22 MED ORDER — PREGABALIN 150 MG PO CAPS: 150.0000 mg | ORAL_CAPSULE | Freq: Three times a day (TID) | ORAL | 0 refills | Status: DC

## 2023-06-22 NOTE — Telephone Encounter (Signed)
Pt LVM stating she got an email stating her appts for 06/22/2023 have been cancelled.  Pt stated she wanted to know why her appts were cancelled.  Returned pt's call and spoke with pt.  Stated the appts were cancelled after Dr. Latanya Maudlin office and Infusion made several attempts to contact pt and listed contacts.  Stated appts were cancelled after pt had not showed up to her lab appt and staff were unable to reach pt via telephone.  Stated Dr. Latanya Maudlin scheduler will contact pt to get the pt rescheduled for her appts.  Pt verbalized understanding and apologized for the appt mix up.  Pt stated she thought her appts were on 06/23/2023.

## 2023-06-22 NOTE — Assessment & Plan Note (Signed)
 [id number], stage IV,with oligo lung metastasis, intact MMR, KRAS G12V (+) -We reviewed her medical record and workup in detail with the patient and her daughter.  She presented with hematochezia since 05/2022 -Workup showed a near obstructing rectal mass. Path confirmed adenocarcinoma. Baseline CEA elevated to 18.1.  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  I added bevacizumab to her chemo from cycle 3 -restaging CT 11/9 showed good PR, will continue for a total of 8 cycle chemo then proceed with concurrent chemoRT to rectal cancer and SBRT to lung met

## 2023-06-22 NOTE — Telephone Encounter (Signed)
Tried to call patient and all contacts to see if she was [place] come in today for her appointments. There was no answer at any of the numbers and her number had no voice mail. Will no show her to all appointments and made infusion aware.

## 2023-06-24 ENCOUNTER — Inpatient Hospital Stay: Payer: Medicaid Other

## 2023-06-30 ENCOUNTER — Inpatient Hospital Stay: Payer: Medicaid Other | Admitting: Hematology

## 2023-06-30 ENCOUNTER — Telehealth: Payer: Self-pay | Admitting: Hematology

## 2023-06-30 ENCOUNTER — Inpatient Hospital Stay: Payer: Medicaid Other

## 2023-06-30 ENCOUNTER — Other Ambulatory Visit: Payer: Self-pay | Admitting: Nurse Practitioner

## 2023-06-30 ENCOUNTER — Inpatient Hospital Stay: Payer: Medicaid Other | Admitting: Nurse Practitioner

## 2023-06-30 DIAGNOSIS — C20 Malignant neoplasm of rectum: Secondary | ICD-10-CM

## 2023-06-30 DIAGNOSIS — Z515 Encounter for palliative care: Secondary | ICD-10-CM

## 2023-06-30 DIAGNOSIS — G893 Neoplasm related pain (acute) (chronic): Secondary | ICD-10-CM

## 2023-06-30 MED ORDER — OXYCODONE HCL 10 MG PO TABS: 10.0000 mg | ORAL_TABLET | Freq: Three times a day (TID) | ORAL | 0 refills | Status: DC | PRN

## 2023-06-30 NOTE — Assessment & Plan Note (Signed)
 [id number], stage IV,with oligo lung metastasis, intact MMR, KRAS G12V (+) -We reviewed her medical record and workup in detail with the patient and her daughter.  She presented with hematochezia since 05/2022 -Workup showed a near obstructing rectal mass. Path confirmed adenocarcinoma. Baseline CEA elevated to 18.1.  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  I added bevacizumab to her chemo from cycle 3 -restaging CT 11/9 showed good PR, will continue for a total of 8 cycle chemo then proceed with concurrent chemoRT to rectal cancer and SBRT to lung met

## 2023-06-30 NOTE — Progress Notes (Unsigned)
Palliative Medicine [facility]  Telephone:(336) [phone] Fax:(336) [phone]   Name: Erica [name] Date: 06/30/2023 MRN: [id number]  DOB: Feb 14, 1966  Patient Care Team: System, Provider Not In as PCP - General Gollan, Tollie Pizza, MD as PCP - Cardiology (Cardiology) Delma Freeze, FNP as Nurse Practitioner (Family Medicine) Antonieta Iba, MD as Consulting Physician (Cardiology) Merwyn Katos, MD (Inactive) as Consulting Physician (Pulmonary Disease) Jim Like, RN as Registered Nurse Scarlett Presto, RN (Inactive) as Registered Nurse Benita Gutter, RN as Oncology Nurse Navigator Malachy Mood, MD as Consulting Physician (Oncology) Pickenpack-Cousar, Arty Baumgartner, NP as Nurse Practitioner (Hospice and Palliative Medicine)    INTERVAL HISTORY: Erica [name] is a 57 y.o. 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 Palliative ask to see for symptom management and goals of care.   SOCIAL HISTORY:     reports that she has been smoking cigarettes. She has a 138 pack-year smoking history. She has never used smokeless tobacco. She reports current alcohol use. She reports that she does not use drugs.  ADVANCE DIRECTIVES:  None on file  CODE STATUS: Full code  PAST MEDICAL HISTORY: Past Medical History:  Diagnosis Date  . (HFpEF) heart failure with preserved ejection fraction (HCC)    a. 05/2015 Echo: EF 60-65%, no rwma, PASP .  Marland Kitchen Acute pancreatitis 08/13/2018  . Anxiety   . Arthritis   . Asthma   . Bell's palsy    no deficit  . Bronchitis   . Cancer (HCC) 12/2022   rectal cancer  . CHF (congestive heart failure) (HCC)   . COPD (chronic obstructive pulmonary disease) (HCC)   . Depression   . Diabetes mellitus without complication (HCC)    type II 03/2017  . Dyspnea   . Fatty liver    per patient  . Gall stones    per  patient  . Gastric ulcer   . Hyperlipidemia   . Hypertension   . Lung nodule 12/2022   upper left  . OSA (obstructive sleep apnea)    a. did not tolerate CPAP. On oxygen 2L via [place]  . Pancreatitis    07/2018  . Shoulder injury    6/19  . Smoker     ALLERGIES:  is allergic to tramadol and penicillins.  MEDICATIONS:  Current Outpatient Medications  Medication Sig Dispense Refill  . albuterol (VENTOLIN HFA) 108 (90 Base) MCG/ACT inhaler Inhale 2 puffs into the lungs every 6 (six) hours as needed for wheezing or shortness of breath.     Marland Kitchen amoxicillin-clavulanate (AUGMENTIN) 875-125 MG tablet Take 1 tablet by mouth 2 (two) times daily. 14 tablet 0  . Blood Glucose Monitoring Suppl (ACCU-CHEK AVIVA PLUS) w/Device KIT See admin instructions.  0  . busPIRone (BUSPAR) 30 MG tablet Take 30 mg by mouth 2 (two) times daily. PER PT STATES HER DOSE WAS INCREASED    . clonazePAM (KLONOPIN) 0.5 MG tablet Take 0.5 mg by mouth at bedtime as needed for anxiety.    Marland Kitchen diltiazem (CARDIZEM CD) 120 MG 24 hr capsule TAKE 1 CAPSULE BY MOUTH EVERY DAY 180 capsule 2  . diltiazem (CARDIZEM) 30 MG tablet TAKE 1 TABLET(30 MG) BY MOUTH THREE TIMES DAILY AS NEEDED 90 tablet 3  . DULoxetine (CYMBALTA) 60 MG capsule Take 60 mg by mouth 2 (two) times daily.     Marland Kitchen ezetimibe (ZETIA) 10 MG tablet Take  10 mg by mouth at bedtime.    . fluconazole (DIFLUCAN) 150 MG tablet Take 1 tablet po one time for yeast infection/thrush. May repeat dose in 3 days for persistent symptoms. 3 tablet 0  . Fluticasone-Salmeterol (ADVAIR) 250-50 MCG/DOSE AEPB Inhale 1 puff into the lungs 2 (two) times daily.    . furosemide (LASIX) 40 MG tablet TAKE 1 TABLET BY MOUTH EVERY DAY. MAY TAKE ADDITIONAL TABLET IN THE AM FOR WEIGHT GAIN AS NEEDED 110 tablet 2  . JARDIANCE 25 MG TABS tablet Take 25 mg by mouth daily.    Marland Kitchen lidocaine-prilocaine (EMLA) cream Apply to affected area once 30 g 3  . magic mouthwash (nystatin, lidocaine, diphenhydrAMINE, alum  & mag hydroxide) suspension Take 5 mLs by mouth 4 (four) times daily as needed for mouth pain. 140 mL 1  . metFORMIN (GLUCOPHAGE-XR) 500 MG 24 hr tablet Take 1,000 mg by mouth 2 (two) times daily.    . ondansetron (ZOFRAN) 8 MG tablet Take 1 tablet (8 mg total) by mouth every 8 (eight) hours as needed for nausea, vomiting or refractory nausea / vomiting. Start on the third day after chemotherapy. 30 tablet 1  . Oxycodone HCl 10 MG TABS Take 1 tablet (10 mg total) by mouth 3 (three) times daily as needed. 90 tablet 0  . OXYGEN Inhale 2 L into the lungs at bedtime. Or as needed throughout the day if short of breath    . pantoprazole (PROTONIX) 40 MG tablet Take 1 tablet (40 mg total) by mouth daily. 30 tablet 0  . potassium chloride SA (KLOR-CON) 20 MEQ tablet Take 20 mEq by mouth 2 (two) times daily.    . pregabalin (LYRICA) 150 MG capsule Take 1 capsule (150 mg total) by mouth 3 (three) times daily. 90 capsule 0  . prochlorperazine (COMPAZINE) 10 MG tablet Take 1 tablet (10 mg total) by mouth every 6 (six) hours as needed for nausea or vomiting. 30 tablet 1  . spironolactone (ALDACTONE) 25 MG tablet TAKE 1 TABLET(25 MG) BY MOUTH EVERY MORNING 90 tablet 3  . sucralfate (CARAFATE) 1 G tablet Take 1 tablet (1 g total) by mouth 4 (four) times daily -  with meals and at bedtime. (Patient taking differently: Take 1 g by mouth 2 (two) times daily.) 120 tablet 0  . tiotropium (SPIRIVA) 18 MCG inhalation capsule Place 18 mcg into inhaler and inhale daily.    Marland Kitchen tiZANidine (ZANAFLEX) 4 MG tablet Take 8 mg by mouth 3 (three) times daily.    . Varenicline Tartrate, Starter, 0.5 MG X 11 & 1 MG X 42 TBPK See admin instructions. follow package directions    . zolpidem (AMBIEN) 10 MG tablet Take 10 mg by mouth at bedtime as needed for sleep.     No current facility-administered medications for this visit.    VITAL SIGNS: LMP 03/13/2015 (Approximate) Comment: more than 2 years ago There were no vitals filed for  this visit.  Estimated body mass index is 23.92 kg/m as calculated from the following:   Height as of 06/09/23: 5\' 8"  (1.727 m).   Weight as of 06/09/23: 157 lb 4.8 oz (71.4 kg).   PERFORMANCE STATUS (ECOG) : 1 - Symptomatic but completely ambulatory   Physical Exam General: NAD Cardiovascular: regular rate and rhythm Pulmonary: clear ant fields Abdomen: soft, nontender, + bowel sounds Extremities: no edema, no joint deformities Skin: no rashes Neurological: AAO x3  IMPRESSION: I saw Erica [name] during her infusion. No acute distress. Patient  states she is doing well overall. Some delays with treatment due to difficulty with blood return from port. Pending cathflo results. She denies nausea, vomiting, constipation.  Appetite fluctuates.  Some days are better than others.    Her pain is well control on current regimen. Does not require oxycodone around the clock. Lyrica as prescribed. She is much appreciative of how well she has been feeling.   No changes to regimen needed at this time. All symptoms well managed.    Goals of care  03/31/23- We discussed her current illness and what it means in the larger context of her on-going co-morbidities. Natural disease trajectory and expectations were discussed.   Erica [name] is realistic in her understanding.  Verbalize goal is to continue to treat the treatable allow her every opportunity to continue to thrive.  Is taking things oneday at a time.  We discussed Her current illness and what it means in the larger context of Her on-going co-morbidities. Natural disease trajectory and expectations were discussed.  I discussed the importance of continued conversation with family and their medical providers regarding overall plan of care and treatment options, ensuring decisions are within the context of the patients values and GOCs.  PLAN: Oxycodone 10 mg every 8 hours as needed. Lyrica 150 mg 3 times daily Pain contract on file. MiraLAX twice  daily for bowel regimen Ongoing symptom management support. I will plan to see patient back in 3-4 weeks in collaboration to other oncology appointments.   Patient expressed understanding and was in agreement with this plan. She also understands that She can call the clinic at any time with any questions, concerns, or complaints.   Any controlled substances utilized were prescribed in the context of palliative care. PDMP has been reviewed.    Visit consisted of counseling and education dealing with the complex and emotionally intense issues of symptom management and palliative care in the setting of serious and potentially life-threatening illness.  Willette Alma, AGPCNP-BC  Palliative Medicine Team/[facility]  *Please note that this is a verbal dictation therefore any spelling or grammatical errors are due to the "Dragon Medical One" system interpretation.

## 2023-07-01 NOTE — Progress Notes (Unsigned)
Palliative Medicine [facility]  Telephone:(336) [phone] Fax:(336) [phone]   Name: Erica [name] Date: 07/01/2023 MRN: [id number]  DOB: 1966/02/22  Patient Care Team: System, Provider Not In as PCP - General Gollan, Tollie Pizza, MD as PCP - Cardiology (Cardiology) Delma Freeze, FNP as Nurse Practitioner (Family Medicine) Antonieta Iba, MD as Consulting Physician (Cardiology) Merwyn Katos, MD (Inactive) as Consulting Physician (Pulmonary Disease) Jim Like, RN as Registered Nurse Scarlett Presto, RN (Inactive) as Registered Nurse Benita Gutter, RN as Oncology Nurse Navigator Malachy Mood, MD as Consulting Physician (Oncology) Pickenpack-Cousar, Arty Baumgartner, NP as Nurse Practitioner (Hospice and Palliative Medicine)    INTERVAL HISTORY: Erica [name] is a 57 y.o. 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 Palliative ask to see for symptom management and goals of care.   SOCIAL HISTORY:     reports that she has been smoking cigarettes. She has a 138 pack-year smoking history. She has never used smokeless tobacco. She reports current alcohol use. She reports that she does not use drugs.  ADVANCE DIRECTIVES:  None on file  CODE STATUS: Full code  PAST MEDICAL HISTORY: Past Medical History:  Diagnosis Date  . (HFpEF) heart failure with preserved ejection fraction (HCC)    a. 05/2015 Echo: EF 60-65%, no rwma, PASP .  Marland Kitchen Acute pancreatitis 08/13/2018  . Anxiety   . Arthritis   . Asthma   . Bell's palsy    no deficit  . Bronchitis   . Cancer (HCC) 12/2022   rectal cancer  . CHF (congestive heart failure) (HCC)   . COPD (chronic obstructive pulmonary disease) (HCC)   . Depression   . Diabetes mellitus without complication (HCC)    type II 03/2017  . Dyspnea   . Fatty liver    per patient  . Gall stones    per  patient  . Gastric ulcer   . Hyperlipidemia   . Hypertension   . Lung nodule 12/2022   upper left  . OSA (obstructive sleep apnea)    a. did not tolerate CPAP. On oxygen 2L via [place]  . Pancreatitis    07/2018  . Shoulder injury    6/19  . Smoker     ALLERGIES:  is allergic to tramadol and penicillins.  MEDICATIONS:  Current Outpatient Medications  Medication Sig Dispense Refill  . albuterol (VENTOLIN HFA) 108 (90 Base) MCG/ACT inhaler Inhale 2 puffs into the lungs every 6 (six) hours as needed for wheezing or shortness of breath.     Marland Kitchen amoxicillin-clavulanate (AUGMENTIN) 875-125 MG tablet Take 1 tablet by mouth 2 (two) times daily. 14 tablet 0  . Blood Glucose Monitoring Suppl (ACCU-CHEK AVIVA PLUS) w/Device KIT See admin instructions.  0  . busPIRone (BUSPAR) 30 MG tablet Take 30 mg by mouth 2 (two) times daily. PER PT STATES HER DOSE WAS INCREASED    . clonazePAM (KLONOPIN) 0.5 MG tablet Take 0.5 mg by mouth at bedtime as needed for anxiety.    Marland Kitchen diltiazem (CARDIZEM CD) 120 MG 24 hr capsule TAKE 1 CAPSULE BY MOUTH EVERY DAY 180 capsule 2  . diltiazem (CARDIZEM) 30 MG tablet TAKE 1 TABLET(30 MG) BY MOUTH THREE TIMES DAILY AS NEEDED 90 tablet 3  . DULoxetine (CYMBALTA) 60 MG capsule Take 60 mg by mouth 2 (two) times daily.     Marland Kitchen ezetimibe (ZETIA) 10 MG tablet Take  10 mg by mouth at bedtime.    . fluconazole (DIFLUCAN) 150 MG tablet Take 1 tablet po one time for yeast infection/thrush. May repeat dose in 3 days for persistent symptoms. 3 tablet 0  . Fluticasone-Salmeterol (ADVAIR) 250-50 MCG/DOSE AEPB Inhale 1 puff into the lungs 2 (two) times daily.    . furosemide (LASIX) 40 MG tablet TAKE 1 TABLET BY MOUTH EVERY DAY. MAY TAKE ADDITIONAL TABLET IN THE AM FOR WEIGHT GAIN AS NEEDED 110 tablet 2  . JARDIANCE 25 MG TABS tablet Take 25 mg by mouth daily.    Marland Kitchen lidocaine-prilocaine (EMLA) cream Apply to affected area once 30 g 3  . magic mouthwash (nystatin, lidocaine, diphenhydrAMINE, alum  & mag hydroxide) suspension Take 5 mLs by mouth 4 (four) times daily as needed for mouth pain. 140 mL 1  . metFORMIN (GLUCOPHAGE-XR) 500 MG 24 hr tablet Take 1,000 mg by mouth 2 (two) times daily.    . ondansetron (ZOFRAN) 8 MG tablet Take 1 tablet (8 mg total) by mouth every 8 (eight) hours as needed for nausea, vomiting or refractory nausea / vomiting. Start on the third day after chemotherapy. 30 tablet 1  . Oxycodone HCl 10 MG TABS Take 1 tablet (10 mg total) by mouth 3 (three) times daily as needed. 90 tablet 0  . OXYGEN Inhale 2 L into the lungs at bedtime. Or as needed throughout the day if short of breath    . pantoprazole (PROTONIX) 40 MG tablet Take 1 tablet (40 mg total) by mouth daily. 30 tablet 0  . potassium chloride SA (KLOR-CON) 20 MEQ tablet Take 20 mEq by mouth 2 (two) times daily.    . pregabalin (LYRICA) 150 MG capsule Take 1 capsule (150 mg total) by mouth 3 (three) times daily. 90 capsule 0  . prochlorperazine (COMPAZINE) 10 MG tablet Take 1 tablet (10 mg total) by mouth every 6 (six) hours as needed for nausea or vomiting. 30 tablet 1  . spironolactone (ALDACTONE) 25 MG tablet TAKE 1 TABLET(25 MG) BY MOUTH EVERY MORNING 90 tablet 3  . sucralfate (CARAFATE) 1 G tablet Take 1 tablet (1 g total) by mouth 4 (four) times daily -  with meals and at bedtime. (Patient taking differently: Take 1 g by mouth 2 (two) times daily.) 120 tablet 0  . tiotropium (SPIRIVA) 18 MCG inhalation capsule Place 18 mcg into inhaler and inhale daily.    Marland Kitchen tiZANidine (ZANAFLEX) 4 MG tablet Take 8 mg by mouth 3 (three) times daily.    . Varenicline Tartrate, Starter, 0.5 MG X 11 & 1 MG X 42 TBPK See admin instructions. follow package directions    . zolpidem (AMBIEN) 10 MG tablet Take 10 mg by mouth at bedtime as needed for sleep.     No current facility-administered medications for this visit.    VITAL SIGNS: LMP 03/13/2015 (Approximate) Comment: more than 2 years ago There were no vitals filed for  this visit.  Estimated body mass index is 23.92 kg/m as calculated from the following:   Height as of 06/09/23: 5\' 8"  (1.727 m).   Weight as of 06/09/23: 157 lb 4.8 oz (71.4 kg).   PERFORMANCE STATUS (ECOG) : 1 - Symptomatic but completely ambulatory   Physical Exam General: NAD Cardiovascular: regular rate and rhythm Pulmonary: clear ant fields Abdomen: soft, nontender, + bowel sounds Extremities: no edema, no joint deformities Skin: no rashes Neurological: AAO x3  IMPRESSION: I saw Ms. Klaas during her infusion. No acute distress. Patient  states she is doing well overall. Some delays with treatment due to difficulty with blood return from port. Pending cathflo results. She denies nausea, vomiting, constipation.  Appetite fluctuates.  Some days are better than others.    Her pain is well control on current regimen. Does not require oxycodone around the clock. Lyrica as prescribed. She is much appreciative of how well she has been feeling.   No changes to regimen needed at this time. All symptoms well managed.    Goals of care  03/31/23- We discussed her current illness and what it means in the larger context of her on-going co-morbidities. Natural disease trajectory and expectations were discussed.   Ms. Ursua is realistic in her understanding.  Verbalize goal is to continue to treat the treatable allow her every opportunity to continue to thrive.  Is taking things oneday at a time.  We discussed Her current illness and what it means in the larger context of Her on-going co-morbidities. Natural disease trajectory and expectations were discussed.  I discussed the importance of continued conversation with family and their medical providers regarding overall plan of care and treatment options, ensuring decisions are within the context of the patients values and GOCs.  PLAN: Oxycodone 10 mg every 8 hours as needed. Lyrica 150 mg 3 times daily Pain contract on file. MiraLAX twice  daily for bowel regimen Ongoing symptom management support. I will plan to see patient back in 3-4 weeks in collaboration to other oncology appointments.   Patient expressed understanding and was in agreement with this plan. She also understands that She can call the clinic at any time with any questions, concerns, or complaints.   Any controlled substances utilized were prescribed in the context of palliative care. PDMP has been reviewed.    Visit consisted of counseling and education dealing with the complex and emotionally intense issues of symptom management and palliative care in the setting of serious and potentially life-threatening illness.  Willette Alma, AGPCNP-BC  Palliative Medicine Team/[facility]  *Please note that this is a verbal dictation therefore any spelling or grammatical errors are due to the "Dragon Medical One" system interpretation.

## 2023-07-02 ENCOUNTER — Inpatient Hospital Stay: Payer: Medicaid Other

## 2023-07-03 NOTE — Assessment & Plan Note (Signed)
 [id number], stage IV,with oligo lung metastasis, intact MMR, KRAS G12V (+) -We reviewed her medical record and workup in detail with the patient and her daughter.  She presented with hematochezia since 05/2022 -Workup showed a near obstructing rectal mass. Path confirmed adenocarcinoma. Baseline CEA elevated to 18.1.  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  I added bevacizumab to her chemo from cycle 3 -restaging CT 11/9 showed good PR, will continue for a total of 8 cycle chemo then proceed with concurrent chemoRT to rectal cancer and SBRT to lung met

## 2023-07-04 ENCOUNTER — Telehealth: Payer: Self-pay

## 2023-07-04 ENCOUNTER — Inpatient Hospital Stay: Payer: Medicaid Other | Admitting: Hematology

## 2023-07-04 ENCOUNTER — Encounter: Payer: Self-pay | Admitting: Hematology

## 2023-07-04 ENCOUNTER — Encounter: Payer: Self-pay | Admitting: Nurse Practitioner

## 2023-07-04 ENCOUNTER — Inpatient Hospital Stay (HOSPITAL_BASED_OUTPATIENT_CLINIC_OR_DEPARTMENT_OTHER): Payer: Medicaid Other | Admitting: Nurse Practitioner

## 2023-07-04 ENCOUNTER — Other Ambulatory Visit (HOSPITAL_COMMUNITY): Payer: Self-pay

## 2023-07-04 ENCOUNTER — Telehealth: Payer: Self-pay | Admitting: Pharmacist

## 2023-07-04 ENCOUNTER — Inpatient Hospital Stay: Payer: Medicaid Other

## 2023-07-04 VITALS — BP 111/68 | HR 97 | Temp 97.5°F | Resp 15 | Wt 166.0 lb

## 2023-07-04 DIAGNOSIS — Z515 Encounter for palliative care: Secondary | ICD-10-CM | POA: Diagnosis not present

## 2023-07-04 DIAGNOSIS — K5903 Drug induced constipation: Secondary | ICD-10-CM

## 2023-07-04 DIAGNOSIS — J4489 Other specified chronic obstructive pulmonary disease: Secondary | ICD-10-CM | POA: Diagnosis not present

## 2023-07-04 DIAGNOSIS — Z5189 Encounter for other specified aftercare: Secondary | ICD-10-CM | POA: Diagnosis not present

## 2023-07-04 DIAGNOSIS — C20 Malignant neoplasm of rectum: Secondary | ICD-10-CM

## 2023-07-04 DIAGNOSIS — F419 Anxiety disorder, unspecified: Secondary | ICD-10-CM | POA: Diagnosis not present

## 2023-07-04 DIAGNOSIS — M1990 Unspecified osteoarthritis, unspecified site: Secondary | ICD-10-CM | POA: Diagnosis not present

## 2023-07-04 DIAGNOSIS — Z5111 Encounter for antineoplastic chemotherapy: Secondary | ICD-10-CM | POA: Diagnosis present

## 2023-07-04 DIAGNOSIS — K5900 Constipation, unspecified: Secondary | ICD-10-CM | POA: Diagnosis not present

## 2023-07-04 DIAGNOSIS — Z5112 Encounter for antineoplastic immunotherapy: Secondary | ICD-10-CM | POA: Diagnosis present

## 2023-07-04 DIAGNOSIS — G893 Neoplasm related pain (acute) (chronic): Secondary | ICD-10-CM

## 2023-07-04 DIAGNOSIS — C7800 Secondary malignant neoplasm of unspecified lung: Secondary | ICD-10-CM | POA: Diagnosis not present

## 2023-07-04 DIAGNOSIS — I110 Hypertensive heart disease with heart failure: Secondary | ICD-10-CM | POA: Diagnosis not present

## 2023-07-04 DIAGNOSIS — E785 Hyperlipidemia, unspecified: Secondary | ICD-10-CM | POA: Diagnosis not present

## 2023-07-04 DIAGNOSIS — Z452 Encounter for adjustment and management of vascular access device: Secondary | ICD-10-CM | POA: Diagnosis not present

## 2023-07-04 DIAGNOSIS — I5032 Chronic diastolic (congestive) heart failure: Secondary | ICD-10-CM | POA: Diagnosis not present

## 2023-07-04 DIAGNOSIS — E11628 Type 2 diabetes mellitus with other skin complications: Secondary | ICD-10-CM | POA: Diagnosis not present

## 2023-07-04 LAB — CBC WITH DIFFERENTIAL (CANCER CENTER ONLY)
Abs Immature Granulocytes: 0.03 10*3/uL (ref 0.00–0.07)
Basophils Absolute: 0.1 10*3/uL (ref 0.0–0.1)
Basophils Relative: 1 %
Eosinophils Absolute: 0.2 10*3/uL (ref 0.0–0.5)
Eosinophils Relative: 2 %
HCT: 41.5 % (ref 36.0–46.0)
Hemoglobin: 13.3 g/dL (ref 12.0–15.0)
Immature Granulocytes: 0 %
Lymphocytes Relative: 21 %
Lymphs Abs: 1.8 10*3/uL (ref 0.7–4.0)
MCH: 31.1 pg (ref 26.0–34.0)
MCHC: 32 g/dL (ref 30.0–36.0)
MCV: 97.2 fL (ref 80.0–100.0)
Monocytes Absolute: 0.9 10*3/uL (ref 0.1–1.0)
Monocytes Relative: 11 %
Neutro Abs: 5.4 10*3/uL (ref 1.7–7.7)
Neutrophils Relative %: 65 %
Platelet Count: 190 10*3/uL (ref 150–400)
RBC: 4.27 MIL/uL (ref 3.87–5.11)
RDW: 18.6 % — ABNORMAL HIGH (ref 11.5–15.5)
WBC Count: 8.4 10*3/uL (ref 4.0–10.5)
nRBC: 0 % (ref 0.0–0.2)

## 2023-07-04 LAB — CMP (CANCER CENTER ONLY)
ALT: 13 U/L (ref 0–44)
AST: 13 U/L — ABNORMAL LOW (ref 15–41)
Albumin: 3.6 g/dL (ref 3.5–5.0)
Alkaline Phosphatase: 84 U/L (ref 38–126)
Anion gap: 8 (ref 5–15)
BUN: 8 mg/dL (ref 6–20)
CO2: 25 mmol/L (ref 22–32)
Calcium: 9.2 mg/dL (ref 8.9–10.3)
Chloride: 104 mmol/L (ref 98–111)
Creatinine: 0.74 mg/dL (ref 0.44–1.00)
GFR, Estimated: >60 mL/min (ref >60)
Glucose, Bld: 232 mg/dL — ABNORMAL HIGH (ref 70–99)
Potassium: 4.2 mmol/L (ref 3.5–5.1)
Sodium: 137 mmol/L (ref 135–145)
Total Bilirubin: 0.4 mg/dL (ref <1.2)
Total Protein: 6.3 g/dL — ABNORMAL LOW (ref 6.5–8.1)

## 2023-07-04 MED ORDER — DOXYCYCLINE HYCLATE 100 MG PO TABS: 100.0000 mg | ORAL_TABLET | Freq: Two times a day (BID) | ORAL | 0 refills | Status: DC

## 2023-07-04 MED ORDER — SODIUM CHLORIDE 0.9 % IV SOLN: Freq: Once | INTRAVENOUS | Status: AC

## 2023-07-04 MED ORDER — SODIUM CHLORIDE 0.9% FLUSH
10.0000 mL | INTRAVENOUS | Status: DC | PRN
Start: 2023-07-04 — End: 2023-07-04

## 2023-07-04 MED ORDER — SODIUM CHLORIDE 0.9 % IV SOLN
5.0000 mg/kg | Freq: Once | INTRAVENOUS | Status: AC
  Administered 2023-07-04: 400 mg via INTRAVENOUS
  Filled 2023-07-04: qty 16

## 2023-07-04 MED ORDER — FLUOROURACIL CHEMO INJECTION 5 GM/100ML
2200.0000 mg/m2 | INTRAVENOUS | Status: DC
  Administered 2023-07-04: 4200 mg via INTRAVENOUS
  Filled 2023-07-04: qty 84

## 2023-07-04 MED ORDER — CAPECITABINE 500 MG PO TABS
825.0000 mg/m2 | ORAL_TABLET | Freq: Two times a day (BID) | ORAL | 1 refills | Status: DC
  Filled 2023-07-19: qty 90, 15d supply, fill #0
  Filled 2023-08-17: qty 90, 15d supply, fill #1

## 2023-07-04 MED ORDER — HEPARIN SOD (PORK) LOCK FLUSH 100 UNIT/ML IV SOLN: 500.0000 [IU] | Freq: Once | INTRAVENOUS | Status: DC | PRN

## 2023-07-04 MED ORDER — DEXAMETHASONE SODIUM PHOSPHATE 10 MG/ML IJ SOLN
10.0000 mg | Freq: Once | INTRAMUSCULAR | Status: AC
  Administered 2023-07-04: 10 mg via INTRAVENOUS
  Filled 2023-07-04: qty 1

## 2023-07-04 MED ORDER — PALONOSETRON HCL INJECTION 0.25 MG/5ML
0.2500 mg | Freq: Once | INTRAVENOUS | Status: AC
  Administered 2023-07-04: 0.25 mg via INTRAVENOUS
  Filled 2023-07-04: qty 5

## 2023-07-04 MED ORDER — ATROPINE SULFATE 1 MG/ML IV SOLN
0.5000 mg | Freq: Once | INTRAVENOUS | Status: AC | PRN
  Administered 2023-07-04: 0.5 mg via INTRAVENOUS
  Filled 2023-07-04: qty 1

## 2023-07-04 MED ORDER — LEUCOVORIN CALCIUM INJECTION 350 MG
400.0000 mg/m2 | Freq: Once | INTRAMUSCULAR | Status: AC
  Administered 2023-07-04: 760 mg via INTRAVENOUS
  Filled 2023-07-04: qty 25

## 2023-07-04 MED ORDER — SODIUM CHLORIDE 0.9 % IV SOLN
180.0000 mg/m2 | Freq: Once | INTRAVENOUS | Status: AC
  Administered 2023-07-04: 340 mg via INTRAVENOUS
  Filled 2023-07-04: qty 15

## 2023-07-04 NOTE — Telephone Encounter (Signed)
Oral Oncology Patient Advocate Encounter  After completing a benefits investigation, prior authorization for Capecitabine is not required at this time through [employer] [place] [employer] ([employer]).  Patient's copay is $4.00.     Ardeen Fillers, CPhT Oncology Pharmacy Patient Advocate  [facility]  [phone] (phone) [phone] (fax) 07/04/2023 12:00 PM

## 2023-07-04 NOTE — Patient Instructions (Signed)
[facility] - A DEPT OF [facility]  Discharge Instructions: Thank you for choosing [facility] to provide your oncology and hematology care.   If you have a lab appointment with the Cancer Center, please go directly to the Cancer Center and check in at the registration area.   Wear comfortable clothing and clothing appropriate for easy access to any Portacath or PICC line.   We strive to give you quality time with your provider. You may need to reschedule your appointment if you arrive late (15 or more minutes).  Arriving late affects you and other patients whose appointments are after yours.  Also, if you miss three or more appointments without notifying the office, you may be dismissed from the clinic at the provider's discretion.      For prescription refill requests, have your pharmacy contact our office and allow 72 hours for refills to be completed.    Today you received the following chemotherapy and/or immunotherapy agents: Bevacizumab, Irinotecan, Leucovorin, 5FU      To help prevent nausea and vomiting after your treatment, we encourage you to take your nausea medication as directed.  BELOW ARE SYMPTOMS THAT SHOULD BE REPORTED IMMEDIATELY: *FEVER GREATER THAN 100.4 F (38 C) OR HIGHER *CHILLS OR SWEATING *NAUSEA AND VOMITING THAT IS NOT CONTROLLED WITH YOUR NAUSEA MEDICATION *UNUSUAL SHORTNESS OF BREATH *UNUSUAL BRUISING OR BLEEDING *URINARY PROBLEMS (pain or burning when urinating, or frequent urination) *BOWEL PROBLEMS (unusual diarrhea, constipation, pain near the anus) TENDERNESS IN MOUTH AND THROAT WITH OR WITHOUT PRESENCE OF ULCERS (sore throat, sores in mouth, or a toothache) UNUSUAL RASH, SWELLING OR PAIN  UNUSUAL VAGINAL DISCHARGE OR ITCHING   Items with * indicate a potential emergency and should be followed up as soon as possible or go to the Emergency Department if any problems should occur.  Please show the  CHEMOTHERAPY ALERT CARD or IMMUNOTHERAPY ALERT CARD at check-in to the Emergency Department and triage nurse.  Should you have questions after your visit or need to cancel or reschedule your appointment, please contact [facility] - A DEPT OF [facility]  Dept: [phone]  and follow the prompts.  Office hours are 8:00 a.m. to 4:30 p.m. Monday - Friday. Please note that voicemails left after 4:00 p.m. may not be returned until the following business day.  We are closed weekends and major holidays. You have access to a nurse at all times for urgent questions. Please call the main number to the clinic Dept: [phone] and follow the prompts.   For any non-urgent questions, you may also contact your provider using MyChart. We now offer e-Visits for anyone 64 and older to request care online for non-urgent symptoms. For details visit [email or web address].   Also download the MyChart app! Go to the app store, search "MyChart", open the app, select [place], and log in with your MyChart username and password.

## 2023-07-04 NOTE — Progress Notes (Signed)
[facility]   [phone] [phone]   Clinic Follow up Note   Patient Care Team: System, Provider Not In as PCP - General Gollan, Tollie Pizza, MD as PCP - Cardiology (Cardiology) Delma Freeze, FNP as Nurse Practitioner (Family Medicine) Mariah Milling, Tollie Pizza, MD as Consulting Physician (Cardiology) Merwyn Katos, MD (Inactive) as Consulting Physician (Pulmonary Disease) Jim Like, RN as Registered Nurse Scarlett Presto, RN (Inactive) as Registered Nurse Benita Gutter, RN as Oncology Nurse Navigator Malachy Mood, MD as Consulting Physician (Oncology) Pickenpack-Cousar, Arty Baumgartner, NP as Nurse Practitioner [facility])  Date of Service:  07/04/2023  CHIEF COMPLAINT: f/u of metastatic rectal cancer  CURRENT THERAPY:  Neoadjuvant chemotherapy FOLFOX  Oncology History   Rectal cancer (HCC) cT3cN2M1, stage IV,with oligo lung metastasis, intact MMR, KRAS G12V (+) -We reviewed her medical record and workup in detail with the patient and her daughter.  She presented with hematochezia since 05/2022 -Workup showed a near obstructing rectal mass. Path confirmed adenocarcinoma. Baseline CEA elevated to 18.1.  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  I added bevacizumab to her chemo from cycle 3 -restaging CT 11/9 showed good PR, will continue for a total of 8 cycle chemo then proceed with concurrent chemoRT to rectal cancer and SBRT to lung met    Assessment and Plan    Metastatic Rectal Cancer Follow-up for metastatic rectal cancer. Missed last chemotherapy session due to transportation issues. Last cycle of  chemotherapy today. Radiation postponed until July 25, 2023, to allow a two-week break. No new issues with the last cycle of chemotherapy. Blood counts normal; kidney and liver function tests pending but expected to be normal. Discussed that treatment may not cure the cancer and the port will remain for potential future treatments. - Proceed with the last cycle of chemotherapy today - Start oral chemotherapy (500 mg pill, three tablets twice a day) on the first day of radiation - Call pharmacy to fill the chemotherapy pill prescription - Schedule radiation to start on July 25, 2023 - Follow-up every other week starting July 25, 2023  Diabetic Foot Infection Two-month-old injury to the right big toe caused by her dog. Redness indicates possible infection. Diabetic status complicates healing. Using Neosporin topically. Smoking cessation advised to improve healing. - Prescribe doxycycline, take twice a day - Provide a larger bandage for the toe - Advise to wash feet daily - Follow-up if the infection does not improve  Diabetes Mellitus Diabetes mellitus complicates current foot infection. - Advise to quit smoking to improve healing  General Health Maintenance Cut back on smoking and alcohol consumption. Working on further reducing cigarette use. - Encourage to continue reducing smoking and alcohol consumption  Plan -Lab reviewed, adequate for treatment, will proceed last cycle FOLFOX today -She has follow-up and simulation for radiation on December 18 -I called in [place] to [facility] pharmacy today, to be started on first day of radiation - Follow-up on July 25, 2023, for the start of radiation - Inform if the radiation start date changes.  -I called in doxycycline for her skin ulcer on right big toe and cellulitis       SUMMARY OF ONCOLOGIC HISTORY: Oncology History  Overview Note   Cancer Staging  Rectal cancer [facility]) Staging form: Colon and Rectum, AJCC 8th Edition - Clinical  stage from 12/23/2022: Stage IVA (cT3, cN2, pM1a) - Signed by Rickard Patience, MD on 02/04/2023 Stage prefix: Initial diagnosis     Rectal cancer (HCC)  12/15/2022 Pathology Results   Colonoscopy:   Positive for invasive adenocarcinoma, G1 well differentiated      12/15/2022 Procedure   Colonoscopy under the care of Dr. Sharlet [place]   Findings: -The perianal and digital rectal examinations were normal. -Extensive amounts of liquid semi- liquid semi- solid stool was found in the entire colon -A partially obstructing large mass was found in the rectum. The mass was circumferential.   12/23/2022 Initial Diagnosis   Rectal cancer [facility]) Patient was seen by gastroenterology due to hematochezia as well as unintentional weight loss.  12/15/2022, colonoscopy showed fungating partially obstructing large mass in the rectum, circumferential, mass extended from about 15 cm from anus to about 8 cm from anal verge.  Biopsy was taken. Poor preparation of colon.  Colonoscopy was repeated on 12/16/2022, preparations inadequate.  Pathology showed invasive adenocarcinoma.   12/23/2022 Cancer Staging   Staging form: Colon and Rectum, AJCC 8th Edition - Clinical stage from 12/23/2022: Stage Unknown (cTX, cNX) - Signed by Rickard Patience, MD on 12/23/2022 Stage prefix: Initial diagnosis  Staging form: Colon and Rectum, AJCC 8th Edition - Clinical stage from 12/23/2022: Stage IVA (cT3, cN2, pM1a) - Signed by Rickard Patience, MD on 02/04/2023 Stage prefix: Initial diagnosis   12/23/2022 Tumor Marker   Patient's tumor was tested for the following markers: CEA. Results of the tumor marker test revealed elevated at 18.1.   01/03/2023 Imaging   CT chest abdomen with contrast showed 2.2 cm left upper lobe nodule, suspicious for metastasis. No findings suspicious for metastatic disease in the abdomen. Bilateral adrenal adenomas, benign. Cholelithiasis, without associated inflammatory changes. Aortic Atherosclerosis (ICD10-I70.0) and Emphysema    01/08/2023 Imaging   MRI pelvis without contrast-rectal protocol showed 9.2 cm concentric mid/lower rectal mass, corresponding to the patient's known primary rectal adenocarcinoma, as above. Rectal adenocarcinoma T stage: T3c Rectal adenocarcinoma N stage:  N2 Distance from tumor to the internal anal sphincter is 3.4 cm.   01/27/2023 Procedure   Patient underwent biopsy via bronchoscopy by pulmonology. Biopsy pathology showed adenocarcinoma, Immunohistochemistry is positive with cytokeratin 20 and CDX2 and negative with cytokeratin 7, TTF-1 and napsin A consistent with colonic adenocarcinoma    01/27/2023 Pathology Results   CASE: [phone]   FINAL MICROSCOPIC DIAGNOSIS:  A. LUNG, LUL, FINE NEEDLE ASPIRATION:  Adenocarcinoma  See comment   ADDENDUM:  Immunohistochemistry is positive with cytokeratin 20 and CDX2  and negative with cytokeratin 7, TTF-1 and napsin A consistent with  colonic adenocarcinoma.    02/28/2023 Miscellaneous      03/03/2023 -  Chemotherapy   Patient is on Treatment Plan : COLORECTAL FOLFIRI q14d     05/13/2023 Imaging   CT chest, abdomen, and pelvis with contrast IMPRESSION: 1. Response to therapy of rectal primary, pelvic nodal, and isolated pulmonary metastasis. 2. Moderate amount of stool upstream from the rectal primary. Partial obstruction cannot be excluded. 3. No new or progressive disease. 4. Right Port-A-Cath with anterior SVC pericatheter small volume thrombus. 5. Age advanced coronary artery atherosclerosis. Recommend assessment of coronary risk factors. 6. Incidental findings, including: Cholelithiasis. Aortic Atherosclerosis (ICD10-I70.0).      Discussed the use of AI scribe software for clinical note transcription with the patient, who gave verbal  consent to proceed.  History of Present Illness   The patient, with a history of metastatic rectal cancer, was unable to receive her last cycle of chemotherapy due to vehicle issues. She  reports no new or worsening symptoms from her last cycle of chemotherapy. She has a history of smoking, which she has cut back on, and occasional alcohol consumption. She also reports a toe injury from two months ago that might be infected. She has been applying Neosporin to the wound, but it has not healed and is causing her some discomfort. She is concerned about the wound due to her diabetes and history of leg injury.         All other systems were reviewed with the patient and are negative.  MEDICAL HISTORY:  Past Medical History:  Diagnosis Date   (HFpEF) heart failure with preserved ejection fraction (HCC)    a. 05/2015 Echo: EF 60-65%, no rwma, PASP .   Acute pancreatitis 08/13/2018   Anxiety    Arthritis    Asthma    Bell's palsy    no deficit   Bronchitis    Cancer (HCC) 12/2022   rectal cancer   CHF (congestive heart failure) (HCC)    COPD (chronic obstructive pulmonary disease) (HCC)    Depression    Diabetes mellitus without complication (HCC)    type II 03/2017   Dyspnea    Fatty liver    per patient   Gall stones    per patient   Gastric ulcer    Hyperlipidemia    Hypertension    Lung nodule 12/2022   upper left   OSA (obstructive sleep apnea)    a. did not tolerate CPAP. On oxygen 2L via [place]   Pancreatitis    07/2018   Shoulder injury    6/19   Smoker     SURGICAL HISTORY: Past Surgical History:  Procedure Laterality Date   BRONCHIAL NEEDLE ASPIRATION BIOPSY  01/27/2023   Procedure: BRONCHIAL NEEDLE ASPIRATION BIOPSIES;  Surgeon: Raechel Chute, MD;  Location: [facility] ENDOSCOPY;  Service: Pulmonary;;   COLONOSCOPY WITH PROPOFOL N/A 12/15/2022   Procedure: COLONOSCOPY WITH PROPOFOL;  Surgeon: Wyline Mood, MD;  Location: [facility] ENDOSCOPY;  Service: Gastroenterology;  Laterality: N/A;   COLONOSCOPY WITH PROPOFOL N/A 12/16/2022   Procedure: COLONOSCOPY WITH PROPOFOL;  Surgeon: Wyline Mood, MD;  Location: [facility] ENDOSCOPY;  Service: Gastroenterology;   Laterality: N/A;   FLEXIBLE BRONCHOSCOPY N/A 06/20/2015   Procedure: FLEXIBLE BRONCHOSCOPY;  Surgeon: Shane Crutch, MD;  Location: [facility] ORS;  Service: Pulmonary;  Laterality: N/A;   IR IMAGING GUIDED PORT INSERTION  02/25/2023   KNEE SURGERY Left    8 knee surgeries    I have reviewed the social history and family history with the patient and they are unchanged from previous note.  ALLERGIES:  is allergic to tramadol and penicillins.  MEDICATIONS:  Current Outpatient Medications  Medication Sig Dispense Refill   capecitabine (XELODA) 500 MG tablet Take 3 tablets (1,500 mg total) by mouth 2 (two) times daily after a meal. Take on days of radiation only, Monday through Friday 90 tablet 1   doxycycline (VIBRA-TABS) 100 MG tablet Take 1 tablet (100 mg total) by mouth 2 (two) times daily. 20 tablet 0   albuterol (VENTOLIN HFA) 108 (90 Base) MCG/ACT inhaler Inhale 2 puffs into the lungs every 6 (six) hours as needed for wheezing or shortness of breath.      Blood Glucose Monitoring Suppl (ACCU-CHEK AVIVA PLUS) w/Device KIT See admin  instructions.  0   busPIRone (BUSPAR) 30 MG tablet Take 30 mg by mouth 2 (two) times daily. PER PT STATES HER DOSE WAS INCREASED     clonazePAM (KLONOPIN) 0.5 MG tablet Take 0.5 mg by mouth at bedtime as needed for anxiety.     diltiazem (CARDIZEM CD) 120 MG 24 hr capsule TAKE 1 CAPSULE BY MOUTH EVERY DAY 180 capsule 2   diltiazem (CARDIZEM) 30 MG tablet TAKE 1 TABLET(30 MG) BY MOUTH THREE TIMES DAILY AS NEEDED 90 tablet 3   DULoxetine (CYMBALTA) 60 MG capsule Take 60 mg by mouth 2 (two) times daily.      ezetimibe (ZETIA) 10 MG tablet Take 10 mg by mouth at bedtime.     fluconazole (DIFLUCAN) 150 MG tablet Take 1 tablet po one time for yeast infection/thrush. May repeat dose in 3 days for persistent symptoms. 3 tablet 0   Fluticasone-Salmeterol (ADVAIR) 250-50 MCG/DOSE AEPB Inhale 1 puff into the lungs 2 (two) times daily.     furosemide (LASIX) 40 MG tablet  TAKE 1 TABLET BY MOUTH EVERY DAY. MAY TAKE ADDITIONAL TABLET IN THE AM FOR WEIGHT GAIN AS NEEDED 110 tablet 2   JARDIANCE 25 MG TABS tablet Take 25 mg by mouth daily.     lidocaine-prilocaine (EMLA) cream Apply to affected area once 30 g 3   magic mouthwash (nystatin, lidocaine, diphenhydrAMINE, alum & mag hydroxide) suspension Take 5 mLs by mouth 4 (four) times daily as needed for mouth pain. 140 mL 1   metFORMIN (GLUCOPHAGE-XR) 500 MG 24 hr tablet Take 1,000 mg by mouth 2 (two) times daily.     ondansetron (ZOFRAN) 8 MG tablet Take 1 tablet (8 mg total) by mouth every 8 (eight) hours as needed for nausea, vomiting or refractory nausea / vomiting. Start on the third day after chemotherapy. 30 tablet 1   Oxycodone HCl 10 MG TABS Take 1 tablet (10 mg total) by mouth 3 (three) times daily as needed. 90 tablet 0   OXYGEN Inhale 2 L into the lungs at bedtime. Or as needed throughout the day if short of breath     pantoprazole (PROTONIX) 40 MG tablet Take 1 tablet (40 mg total) by mouth daily. 30 tablet 0   potassium chloride SA (KLOR-CON) 20 MEQ tablet Take 20 mEq by mouth 2 (two) times daily.     pregabalin (LYRICA) 150 MG capsule Take 1 capsule (150 mg total) by mouth 3 (three) times daily. 90 capsule 0   prochlorperazine (COMPAZINE) 10 MG tablet Take 1 tablet (10 mg total) by mouth every 6 (six) hours as needed for nausea or vomiting. 30 tablet 1   spironolactone (ALDACTONE) 25 MG tablet TAKE 1 TABLET(25 MG) BY MOUTH EVERY MORNING 90 tablet 3   sucralfate (CARAFATE) 1 G tablet Take 1 tablet (1 g total) by mouth 4 (four) times daily -  with meals and at bedtime. (Patient taking differently: Take 1 g by mouth 2 (two) times daily.) 120 tablet 0   tiotropium (SPIRIVA) 18 MCG inhalation capsule Place 18 mcg into inhaler and inhale daily.     tiZANidine (ZANAFLEX) 4 MG tablet Take 8 mg by mouth 3 (three) times daily.     Varenicline Tartrate, Starter, 0.5 MG X 11 & 1 MG X 42 TBPK See admin instructions.  follow package directions     zolpidem (AMBIEN) 10 MG tablet Take 10 mg by mouth at bedtime as needed for sleep.     No current facility-administered medications for this  visit.   Facility-Administered Medications Ordered in Other Visits  Medication Dose Route Frequency Provider Last Rate Last Admin   atropine injection 0.5 mg  0.5 mg Intravenous Once PRN Malachy Mood, MD       bevacizumab-awwb (MVASI) 400 mg in sodium chloride 0.9 % 100 mL chemo infusion  5 mg/kg (Treatment Plan Recorded) Intravenous Once Malachy Mood, MD       fluorouracil (ADRUCIL) 4,200 mg in sodium chloride 0.9 % 66 mL chemo infusion  2,200 mg/m2 (Treatment Plan Recorded) Intravenous 1 day or 1 dose Malachy Mood, MD       heparin lock flush 100 unit/mL  500 Units Intracatheter Once PRN Malachy Mood, MD       irinotecan (CAMPTOSAR) 340 mg in sodium chloride 0.9 % 500 mL chemo infusion  180 mg/m2 (Treatment Plan Recorded) Intravenous Once Malachy Mood, MD       leucovorin 760 mg in sodium chloride 0.9 % 250 mL infusion  400 mg/m2 (Treatment Plan Recorded) Intravenous Once Malachy Mood, MD       sodium chloride flush (NS) 0.9 % injection 10 mL  10 mL Intracatheter PRN Malachy Mood, MD        PHYSICAL EXAMINATION: ECOG PERFORMANCE STATUS: 1 - Symptomatic but completely ambulatory  Vitals:   07/04/23 0901  BP: 111/68  Pulse: 97  Resp: 15  Temp: (!) 97.5 F (36.4 C)  SpO2: 99%   Wt Readings from Last 3 Encounters:  07/04/23 166 lb (75.3 kg)  06/09/23 157 lb 4.8 oz (71.4 kg)  05/28/23 175 lb (79.4 kg)     GENERAL:alert, no distress and comfortable SKIN: skin color, texture, turgor are normal, (+) 1 to 2 cm skin ulcer on right big toe with surrounding skin erythema EYES: normal, Conjunctiva are pink and non-injected, sclera clear NECK: supple, thyroid normal size, non-tender, without nodularity LYMPH:  no palpable lymphadenopathy in the cervical, axillary  LUNGS: clear to auscultation and percussion with normal breathing  effort HEART: regular rate & rhythm and no murmurs and no lower extremity edema ABDOMEN:abdomen soft, non-tender and normal bowel sounds Musculoskeletal:no cyanosis of digits and no clubbing  NEURO: alert & oriented x 3 with fluent speech, no focal motor/sensory deficits   LABORATORY DATA:  I have reviewed the data as listed    Latest Ref Rng & Units 07/04/2023    8:37 AM 06/09/2023   12:23 PM 05/28/2023    2:40 PM  CBC  WBC 4.0 - 10.5 K/uL 8.4  9.0  13.4   Hemoglobin 12.0 - 15.0 g/dL 41.3  24.4  01.0   Hematocrit 36.0 - 46.0 % 41.5  41.9  43.1   Platelets 150 - 400 K/uL 190  248  156         Latest Ref Rng & Units 07/04/2023    8:37 AM 06/09/2023   12:23 PM 05/28/2023    2:40 PM  CMP  Glucose 70 - 99 mg/dL 272  536  644   BUN 6 - 20 mg/dL 8  7  11    Creatinine 0.44 - 1.00 mg/dL 0.34  7.42  5.95   Sodium 135 - 145 mmol/L 137  137  134   Potassium 3.5 - 5.1 mmol/L 4.2  4.4  3.5   Chloride 98 - 111 mmol/L 104  101  98   CO2 22 - 32 mmol/L 25  28  25    Calcium 8.9 - 10.3 mg/dL 9.2  9.7  9.0   Total Protein 6.5 - 8.1  g/dL 6.3  7.4  7.5   Total Bilirubin <1.2 mg/dL 0.4  0.3  0.9   Alkaline Phos 38 - 126 U/L 84  150  63   AST 15 - 41 U/L 13  12  24    ALT 0 - 44 U/L 13  16  21        RADIOGRAPHIC STUDIES: I have personally reviewed the radiological images as listed and agreed with the findings in the report. No results found.    No orders of the defined types were placed in this encounter.  All questions were answered. The patient knows to call the clinic with any problems, questions or concerns. No barriers to learning was detected. The total time spent in the appointment was 30 minutes.     Malachy Mood, MD 07/04/2023

## 2023-07-04 NOTE — Progress Notes (Incomplete)
Radiation Oncology         [phone] ________________________________     Name: Erica [name]        MRN: [id number]  Date of Service: 07/06/2023 DOB: Dec 23, 1965  WU:[id number], Provider Not In  Malachy Mood, MD     REFERRING PHYSICIAN: Malachy Mood, MD   DIAGNOSIS: The encounter diagnosis was Rectal cancer [facility]).   HISTORY OF PRESENT ILLNESS: Erica [name] is a 57 y.o. female with  a diagnosis of metastatic rectal cancer.  The patient was evaluated with rectal bleeding that began in November 2023 and with progressive changes was referred to GI.  She underwent colonoscopy on 12/15/2022 which showed a fungating mass in the lower rectum and pathology showed invasive adenocarcinoma her CEA was 18.1 on 12/23/2022, and an MRI of the pelvis on 02/01/2023 showed a 9.2 cm concentric mid to lower rectal mass, T staging was T3c with right anterior extension through the muscularis, and approximately 10 perirectal lymph nodes were noted classifying her tumor as N2 disease, the distance of the tumor from the internal anal sphincter was 3.4 cm, up CT scan of the CAP showed a 2.2 cm LUL nodule suspicious for metastasis, and no other evidence of metastatic disease.  She underwent bronchoscopy on 01/27/2023, and cytology confirmed adenocarcinoma consistent with a gastrointestinal primary.  She met with Dr. Mosetta Putt and began systemic chemotherapy with FOLFIRI on 03/03/2023 and completed cycle 8 on 07/04/2023. Her restaging CT chest abdomen pelvis on 05/13/23 showed response to therapy including improvement of her pelvic adenopathy, the rectal tumor, and slight improvement in measurement as well in her lung nodule. In the report the nodule in the lung is described as left lower lobe, however in personal review, Dr. Mitzi Hansen states this site is the same nodule as previously described in her CT scan prior to chemotherapy. Of note she did have a CT of the abdomen and pelvis on 05/28/2023 due to acute abdominal pain and was diagnosed with  pancreatitis there was a large amount of stool throughout the colon but the description of the report said no evidence of bowel wall thickening distention or inflammatory changes.  She had some stable bilateral adrenal nodules and mild body wall edema as well as fatty infiltration of the liver.  She was treated expectantly and given her For best results from systemic chemotherapy is seen to consider definitive treatment with chemoradiation to the pelvis for her rectal tumor, and stereotactic body radiation to her lung nodule for definitive treatment of her oligometastatic disease.   PREVIOUS RADIATION THERAPY: No   PAST MEDICAL HISTORY:  Past Medical History:  Diagnosis Date   (HFpEF) heart failure with preserved ejection fraction (HCC)    a. 05/2015 Echo: EF 60-65%, no rwma, PASP .   Acute pancreatitis 08/13/2018   Anxiety    Arthritis    Asthma    Bell's palsy    no deficit   Bronchitis    Cancer (HCC) 12/2022   rectal cancer   CHF (congestive heart failure) (HCC)    COPD (chronic obstructive pulmonary disease) (HCC)    Depression    Diabetes mellitus without complication (HCC)    type II 03/2017   Dyspnea    Fatty liver    per patient   Gall stones    per patient   Gastric ulcer    Hyperlipidemia    Hypertension    Lung nodule 12/2022   upper left   OSA (obstructive sleep apnea)    a.  did not tolerate CPAP. On oxygen 2L via [place]   Pancreatitis    07/2018   Shoulder injury    6/19   Smoker        PAST SURGICAL HISTORY: Past Surgical History:  Procedure Laterality Date   BRONCHIAL NEEDLE ASPIRATION BIOPSY  01/27/2023   Procedure: BRONCHIAL NEEDLE ASPIRATION BIOPSIES;  Surgeon: Raechel Chute, MD;  Location: [facility] ENDOSCOPY;  Service: Pulmonary;;   COLONOSCOPY WITH PROPOFOL N/A 12/15/2022   Procedure: COLONOSCOPY WITH PROPOFOL;  Surgeon: Wyline Mood, MD;  Location: [facility] ENDOSCOPY;  Service: Gastroenterology;  Laterality: N/A;   COLONOSCOPY WITH PROPOFOL N/A 12/16/2022    Procedure: COLONOSCOPY WITH PROPOFOL;  Surgeon: Wyline Mood, MD;  Location: [facility] ENDOSCOPY;  Service: Gastroenterology;  Laterality: N/A;   FLEXIBLE BRONCHOSCOPY N/A 06/20/2015   Procedure: FLEXIBLE BRONCHOSCOPY;  Surgeon: Shane Crutch, MD;  Location: [facility] ORS;  Service: Pulmonary;  Laterality: N/A;   IR IMAGING GUIDED PORT INSERTION  02/25/2023   KNEE SURGERY Left    8 knee surgeries     FAMILY HISTORY:  Family History  Problem Relation Age of Onset   Breast cancer Mother        early 47's   Lung cancer Father    Brain cancer Father    Bone cancer Father    Colon cancer Maternal Aunt    Cancer Maternal Uncle        liver?   Diabetes Maternal Grandmother    Asthma Other      SOCIAL HISTORY:  reports that she has been smoking cigarettes. She has a 138 pack-year smoking history. She has never used smokeless tobacco. She reports current alcohol use. She reports that she does not use drugs.  The patient is widowed and lives in [place].  She has 2 adult daughters who live on the same property and she feels supported and looked after. She has a boyfriend who is a high school sweetheart she sees from time to time.    ALLERGIES: Tramadol and Penicillins   MEDICATIONS:  Current Outpatient Medications  Medication Sig Dispense Refill   albuterol (VENTOLIN HFA) 108 (90 Base) MCG/ACT inhaler Inhale 2 puffs into the lungs every 6 (six) hours as needed for wheezing or shortness of breath.      Blood Glucose Monitoring Suppl (ACCU-CHEK AVIVA PLUS) w/Device KIT See admin instructions.  0   busPIRone (BUSPAR) 30 MG tablet Take 30 mg by mouth 2 (two) times daily. PER PT STATES HER DOSE WAS INCREASED     capecitabine (XELODA) 500 MG tablet Take 3 tablets (1,500 mg total) by mouth 2 (two) times daily after a meal. Take on days of radiation only, Monday through Friday 90 tablet 1   clonazePAM (KLONOPIN) 0.5 MG tablet Take 0.5 mg by mouth at bedtime as needed for anxiety.     diltiazem  (CARDIZEM CD) 120 MG 24 hr capsule TAKE 1 CAPSULE BY MOUTH EVERY DAY 180 capsule 2   diltiazem (CARDIZEM) 30 MG tablet TAKE 1 TABLET(30 MG) BY MOUTH THREE TIMES DAILY AS NEEDED 90 tablet 3   doxycycline (VIBRA-TABS) 100 MG tablet Take 1 tablet (100 mg total) by mouth 2 (two) times daily. 20 tablet 0   DULoxetine (CYMBALTA) 60 MG capsule Take 60 mg by mouth 2 (two) times daily.      ezetimibe (ZETIA) 10 MG tablet Take 10 mg by mouth at bedtime.     fluconazole (DIFLUCAN) 150 MG tablet Take 1 tablet po one time for yeast infection/thrush. May repeat dose in  3 days for persistent symptoms. 3 tablet 0   Fluticasone-Salmeterol (ADVAIR) 250-50 MCG/DOSE AEPB Inhale 1 puff into the lungs 2 (two) times daily.     furosemide (LASIX) 40 MG tablet TAKE 1 TABLET BY MOUTH EVERY DAY. MAY TAKE ADDITIONAL TABLET IN THE AM FOR WEIGHT GAIN AS NEEDED 110 tablet 2   JARDIANCE 25 MG TABS tablet Take 25 mg by mouth daily.     lidocaine-prilocaine (EMLA) cream Apply to affected area once 30 g 3   magic mouthwash (nystatin, lidocaine, diphenhydrAMINE, alum & mag hydroxide) suspension Take 5 mLs by mouth 4 (four) times daily as needed for mouth pain. 140 mL 1   metFORMIN (GLUCOPHAGE-XR) 500 MG 24 hr tablet Take 1,000 mg by mouth 2 (two) times daily.     ondansetron (ZOFRAN) 8 MG tablet Take 1 tablet (8 mg total) by mouth every 8 (eight) hours as needed for nausea, vomiting or refractory nausea / vomiting. Start on the third day after chemotherapy. 30 tablet 1   Oxycodone HCl 10 MG TABS Take 1 tablet (10 mg total) by mouth 3 (three) times daily as needed. 90 tablet 0   OXYGEN Inhale 2 L into the lungs at bedtime. Or as needed throughout the day if short of breath     pantoprazole (PROTONIX) 40 MG tablet Take 1 tablet (40 mg total) by mouth daily. 30 tablet 0   potassium chloride SA (KLOR-CON) 20 MEQ tablet Take 20 mEq by mouth 2 (two) times daily.     pregabalin (LYRICA) 150 MG capsule Take 1 capsule (150 mg total) by mouth 3  (three) times daily. 90 capsule 0   prochlorperazine (COMPAZINE) 10 MG tablet Take 1 tablet (10 mg total) by mouth every 6 (six) hours as needed for nausea or vomiting. 30 tablet 1   spironolactone (ALDACTONE) 25 MG tablet TAKE 1 TABLET(25 MG) BY MOUTH EVERY MORNING 90 tablet 3   sucralfate (CARAFATE) 1 G tablet Take 1 tablet (1 g total) by mouth 4 (four) times daily -  with meals and at bedtime. (Patient taking differently: Take 1 g by mouth 2 (two) times daily.) 120 tablet 0   tiotropium (SPIRIVA) 18 MCG inhalation capsule Place 18 mcg into inhaler and inhale daily.     tiZANidine (ZANAFLEX) 4 MG tablet Take 8 mg by mouth 3 (three) times daily.     Varenicline Tartrate, Starter, 0.5 MG X 11 & 1 MG X 42 TBPK See admin instructions. follow package directions     zolpidem (AMBIEN) 10 MG tablet Take 10 mg by mouth at bedtime as needed for sleep.     No current facility-administered medications for this visit.     REVIEW OF SYSTEMS: On review of systems, the patient is doing well and not having pelvic fullness or rectal bleeding. Her bowel movements are soft, and she had some difficulty with control at times of her stools. She has had a few episodes of mucous per rectum, but states that this and the previous rectal bleeding have not been seen in quite a while. No other complaints are verbalized.        PHYSICAL EXAM:  Wt Readings from Last 3 Encounters:  07/06/23 (P) 168 lb 4.8 oz (76.3 kg)  07/04/23 166 lb (75.3 kg)  06/09/23 157 lb 4.8 oz (71.4 kg)   Temp Readings from Last 3 Encounters:  07/06/23 (P) 97.9 F (36.6 C) ((P) Oral)  07/04/23 (!) 97.5 F (36.4 C) (Temporal)  06/11/23 (!) 97.2 F (36.2 C)  BP Readings from Last 3 Encounters:  07/06/23 (P) 102/69  07/04/23 111/68  06/11/23 (!) 151/79   Pulse Readings from Last 3 Encounters:  07/06/23 (P) 82  07/04/23 97  06/11/23 (!) 105      ECOG = 0  0 - Asymptomatic (Fully active, able to carry on all predisease activities  without restriction)  1 - Symptomatic but completely ambulatory (Restricted in physically strenuous activity but ambulatory and able to carry out work of a light or sedentary nature. For example, light housework, office work)  2 - Symptomatic, <50% in bed during the day (Ambulatory and capable of all self care but unable to carry out any work activities. Up and about more than 50% of waking hours)  3 - Symptomatic, >50% in bed, but not bedbound (Capable of only limited self-care, confined to bed or chair 50% or more of waking hours)  4 - Bedbound (Completely disabled. Cannot carry on any self-care. Totally confined to bed or chair)  5 - Death   Santiago Glad MM, Creech RH, Tormey DC, et al. [phone]). "Toxicity and response criteria of the [facility]". Am. Evlyn Clines. Oncol. 5 (6): 649-55    LABORATORY DATA:  Lab Results  Component Value Date   WBC 8.4 07/04/2023   HGB 13.3 07/04/2023   HCT 41.5 07/04/2023   MCV 97.2 07/04/2023   PLT 190 07/04/2023   Lab Results  Component Value Date   NA 137 07/04/2023   K 4.2 07/04/2023   CL 104 07/04/2023   CO2 25 07/04/2023   Lab Results  Component Value Date   ALT 13 07/04/2023   AST 13 (L) 07/04/2023   ALKPHOS 84 07/04/2023   BILITOT 0.4 07/04/2023      RADIOGRAPHY: No results found.     IMPRESSION/PLAN: 1. 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 Dr. Mitzi Hansen discusses the patient's course to date and agrees with Dr. Mosetta Putt to proceed with locally curative therapy to the pelvis with chemoRT, and to treat the oligometastatic disease in the LUL with stereotactic body radiotherapy (SBRT).  We discussed the risks, benefits, short, and long term effects of radiotherapy, as well as the curative intent, and the patient is interested in proceeding. Dr. Mitzi Hansen discusses the delivery and logistics of radiotherapy and anticipates a course of 5 1/2 weeks of radiotherapy to the pelvis which we would  plan to begin on 07/25/23. She would also benefit from SBRT to the lung nodule, we will plan to simulate that treatment the week of 08/15/23 and begin that treatment on 08/31/23. Written consent is obtained and placed in the chart, a copy was provided to the patient. She will simulate for the pelvis today. Of note she has not been seen by colorectal surgery, so I will place an urgent referral so she can be seen prior to radiotherapy beginning on 07/25/23. 2. Risks of pelvic floor dysfunction from radiotherapy. We discussed the importance of evaluation with physical therapy prior to pelvic radiation. A referral was placed to physical therapy today.    Dr. Mitzi Hansen discusses the pathology findings and reviews the nature of metastatic rectal cancer. She is going to begin systemic chemotherapy. She would be a good candidate for SBRT to the LUL nodule if she is not a surgical candidate. If she were to proceed this would likely be several cycles of chemo from now and would be 3-5 fractions of treatment. We also discussed the possibility of chemoradiation to the pelvis  several months from now depending on her response to therapy as well.  We discussed the risks, benefits, short, and long term effects of radiotherapy, as well as the curative intent, and the patient is interested in proceeding with chemoradiation to the pelvis in the future if she is eligible and we will follow up with the decisions she makes with cardiothoracic surgery as she pursues chemotherapy.      In a visit lasting 60 minutes, greater than 50% of the time was spent face to face discussing the patient's condition, in preparation for the discussion, and coordinating the patient's care.       Osker Mason, [facility]   **Disclaimer: This note was dictated with voice recognition software. Similar sounding words can inadvertently be transcribed and this note may contain transcription errors which may not have been corrected upon publication of  note.**

## 2023-07-04 NOTE — Telephone Encounter (Signed)
Oral Oncology Pharmacist Encounter  Received new prescription for Xeloda (capecitabine) for the treatment of metastatic rectal cancer in conjunction with radiation, planned duration 5 1/2 weeks. Planned start date per MD note January 2025.  CBC w/ Diff and CMP from 07/04/23 assessed, no relevant lab abnormalities requiring baseline dose adjustment required at this time. Prescription dose and frequency assessed for appropriateness.  Current medication list in Epic reviewed, DDIs with Xeloda identified: Category C DDI between Xeloda and pantoprazole - proton-pump inhibitors can decrease efficacy of Xeloda - will discuss with patient alternatives to pantoprazole, such as H2RA's like famotidine while on Xeloda. Category C DDI between Xeloda and Ondansetron due to risk of Qtc prolongation with fluorouracil products. Noted patient only taking PRN and PO route, risk higher with IV administration. No change in therapy warranted at this time.   Evaluated chart and no patient barriers to medication adherence noted.   Patient agreement for treatment documented in MD note on 07/04/23.  Prescription has been e-scribed to the [facility] for benefits analysis and approval.  Oral Oncology Clinic will continue to follow for insurance authorization, copayment issues, initial counseling and start date.  Sherry Ruffing, PharmD, BCPS, BCOP Hematology/Oncology Clinical Pharmacist [facility] and [facility] Oral Chemotherapy Navigation Clinics [phone] 07/04/2023 10:30 AM

## 2023-07-05 ENCOUNTER — Encounter: Payer: Self-pay | Admitting: Radiation Oncology

## 2023-07-05 ENCOUNTER — Encounter: Payer: Self-pay | Admitting: Hematology

## 2023-07-05 DIAGNOSIS — Z5112 Encounter for antineoplastic immunotherapy: Secondary | ICD-10-CM | POA: Diagnosis not present

## 2023-07-05 NOTE — Progress Notes (Signed)
Nursing interview for Rectal cancer [facility]) metastasized to LUL.   Patient identity verified x2.  Patient reports lower back / LT hip 3/10, SOB, and fatigue. Patient denies any other related issues at this time.  Meaningful use complete.  Vitals- See today's vitals section 07/06/2023  This concludes the interaction.  Ruel Favors, LPN  Lower back/ Lt hip 3/10

## 2023-07-06 ENCOUNTER — Ambulatory Visit
Admission: RE | Admit: 2023-07-06 | Discharge: 2023-07-06 | Disposition: A | Discharge status: Home / Self Care | Payer: Medicaid Other | Source: Ambulatory Visit | Attending: Radiation Oncology | Admitting: Radiation Oncology

## 2023-07-06 ENCOUNTER — Encounter: Payer: Self-pay | Admitting: Radiation Oncology

## 2023-07-06 ENCOUNTER — Inpatient Hospital Stay: Payer: Medicaid Other

## 2023-07-06 VITALS — BP 128/71 | HR 66 | Temp 98.2°F | Resp 16

## 2023-07-06 DIAGNOSIS — C3482 Malignant neoplasm of overlapping sites of left bronchus and lung: Secondary | ICD-10-CM

## 2023-07-06 DIAGNOSIS — C20 Malignant neoplasm of rectum: Secondary | ICD-10-CM | POA: Insufficient documentation

## 2023-07-06 DIAGNOSIS — Z5112 Encounter for antineoplastic immunotherapy: Secondary | ICD-10-CM | POA: Diagnosis not present

## 2023-07-06 MED ORDER — HEPARIN SOD (PORK) LOCK FLUSH 100 UNIT/ML IV SOLN
500.0000 [IU] | Freq: Once | INTRAVENOUS | Status: AC | PRN
Start: 2023-07-06 — End: 2023-07-06
  Administered 2023-07-06: 500 [IU]

## 2023-07-06 MED ORDER — PEGFILGRASTIM-FPGK 6 MG/0.6ML ~~LOC~~ SOSY
6.0000 mg | PREFILLED_SYRINGE | Freq: Once | SUBCUTANEOUS | Status: AC
Start: 2023-07-06 — End: 2023-07-06
  Administered 2023-07-06: 6 mg via SUBCUTANEOUS

## 2023-07-06 MED ORDER — SODIUM CHLORIDE 0.9% FLUSH
10.0000 mL | INTRAVENOUS | Status: DC | PRN
  Administered 2023-07-06: 10 mL

## 2023-07-08 ENCOUNTER — Ambulatory Visit: Payer: Medicaid Other | Admitting: Pulmonary Disease

## 2023-07-15 ENCOUNTER — Other Ambulatory Visit: Payer: Self-pay | Admitting: Pharmacy Technician

## 2023-07-15 DIAGNOSIS — Z5112 Encounter for antineoplastic immunotherapy: Secondary | ICD-10-CM | POA: Diagnosis not present

## 2023-07-19 ENCOUNTER — Other Ambulatory Visit: Payer: Self-pay

## 2023-07-19 ENCOUNTER — Other Ambulatory Visit (HOSPITAL_COMMUNITY): Payer: Self-pay

## 2023-07-19 NOTE — Telephone Encounter (Signed)
 Oral Chemotherapy Pharmacist Encounter  I spoke with patient for overview of: Xeloda  (capecitabine ) for the treatment of metastatic rectal cancer in conjunction with radiation, planned duration 5 1/2 weeks.   Counseled patient on administration, dosing, side effects, monitoring, drug-food interactions, safe handling, storage, and disposal.  Patient will take Xeloda  500mg  tablets, 3 tablets (1500mg ) by mouth in AM and 3 tabs (1500mg ) by mouth in PM, within 30 minutes of finishing meals, on days of radiation only.  Xeloda  and radiation start date: 07/25/23  Adverse effects of Xeloda  include but are not limited to: fatigue, decreased blood counts, GI upset, diarrhea, mouth sores, and hand-foot syndrome. Hand-foot syndrome: discussed use of cream such as [place] Smooth Extra Care 20 or equivalent advanced care cream that has 20% urea content for advanced skin hydration while on Xeloda . Additionally discussed use of OTC Voltaren gel for HFS prophylaxis. Discussed recommended use of Voltaren gel is 1 finger tip application for front/backside of hands and then 1 fingertip application to bottoms of feet twice a day for duration of radiation.  Diarrhea: Patient will obtain Imodium (loperamide) to have on hand if they experience diarrhea. Patient knows to alert the office of 4 or more loose stools above baseline.  Reviewed with patient importance of keeping a medication schedule and plan for any missed doses. No barriers to medication adherence identified.  Medication reconciliation performed and medication/allergy list updated. Patient endorses still taking pantoprazole  - we discussed holding off on pantoprazole  while patient is on Xeloda . We discussed she can use Pepcid  (famotidine ) PRN for acid/reflux while on Xeloda . Patient expressed understanding.   All questions answered.  Ms. Goodgame voiced understanding and appreciation.   Medication education handout placed in mail for patient. Patient knows to  call the office with questions or concerns. Oral Chemotherapy Clinic phone number provided to patient.   Asberry Macintosh, PharmD, BCPS, BCOP Hematology/Oncology Clinical Pharmacist [facility] and [facility] Oral Chemotherapy Navigation Clinics [phone] 07/19/2023 10:19 AM

## 2023-07-19 NOTE — Progress Notes (Signed)
Oral Chemotherapy Pharmacist Encounter  Patient was counseled under telephone encounter from 07/04/23.  Sherry Ruffing, PharmD, BCPS, BCOP Hematology/Oncology Clinical Pharmacist [facility] and [facility] Oral Chemotherapy Navigation Clinics [phone] 07/19/2023 10:22 AM

## 2023-07-25 ENCOUNTER — Other Ambulatory Visit: Payer: Self-pay

## 2023-07-25 ENCOUNTER — Ambulatory Visit
Admission: RE | Admit: 2023-07-25 | Discharge: 2023-07-25 | Disposition: A | Discharge status: Home / Self Care | Payer: Medicaid Other | Source: Ambulatory Visit | Attending: Radiation Oncology | Admitting: Radiation Oncology

## 2023-07-25 DIAGNOSIS — C20 Malignant neoplasm of rectum: Secondary | ICD-10-CM | POA: Insufficient documentation

## 2023-07-25 DIAGNOSIS — Z510 Encounter for antineoplastic radiation therapy: Secondary | ICD-10-CM | POA: Insufficient documentation

## 2023-07-25 DIAGNOSIS — C7800 Secondary malignant neoplasm of unspecified lung: Secondary | ICD-10-CM | POA: Insufficient documentation

## 2023-07-25 DIAGNOSIS — Z452 Encounter for adjustment and management of vascular access device: Secondary | ICD-10-CM | POA: Diagnosis not present

## 2023-07-25 LAB — RAD ONC ARIA SESSION SUMMARY
Course Elapsed Days: 0
Plan Fractions Treated to Date: 1
Plan Prescribed Dose Per Fraction: 1.8 Gy
Plan Total Fractions Prescribed: 25
Plan Total Prescribed Dose: 45 Gy
Reference Point Dosage Given to Date: 1.8 Gy
Reference Point Session Dosage Given: 1.8 Gy
Session Number: 1

## 2023-07-26 ENCOUNTER — Telehealth: Payer: Self-pay | Admitting: Genetic Counselor

## 2023-07-26 ENCOUNTER — Other Ambulatory Visit: Payer: Self-pay

## 2023-07-26 ENCOUNTER — Ambulatory Visit
Admission: RE | Admit: 2023-07-26 | Discharge: 2023-07-26 | Disposition: A | Discharge status: Home / Self Care | Payer: Medicaid Other | Source: Ambulatory Visit | Attending: Radiation Oncology | Admitting: Radiation Oncology

## 2023-07-26 ENCOUNTER — Telehealth: Payer: Self-pay | Admitting: Pharmacist

## 2023-07-26 DIAGNOSIS — Z510 Encounter for antineoplastic radiation therapy: Secondary | ICD-10-CM | POA: Diagnosis not present

## 2023-07-26 LAB — RAD ONC ARIA SESSION SUMMARY
Course Elapsed Days: 1
Plan Fractions Treated to Date: 2
Plan Prescribed Dose Per Fraction: 1.8 Gy
Plan Total Fractions Prescribed: 25
Plan Total Prescribed Dose: 45 Gy
Reference Point Dosage Given to Date: 3.6 Gy
Reference Point Session Dosage Given: 1.8 Gy
Session Number: 2

## 2023-07-26 NOTE — Progress Notes (Signed)
 Palliative Medicine [facility]  [phone] Fax:([phone]) [phone]   Name: Erica [name] Date: 07/26/2023 MRN: [id number]  DOB: 1965/11/15  Patient Care Team: System, Provider Not In as PCP - General Gollan, Evalene PARAS, MD as PCP - Cardiology (Cardiology) Donette Ellouise DELENA, FNP as Nurse Practitioner (Family Medicine) Perla Evalene PARAS, MD as Consulting Physician (Cardiology) Linard Alm NOVAK, MD (Inactive) as Consulting Physician (Pulmonary Disease) Cindie Jesusa HERO, RN as Registered Nurse Dannielle Arlean FALCON, RN (Inactive) as Registered Nurse Maurie Rayfield BIRCH, RN as Oncology Nurse Navigator Lanny Callander, MD as Consulting Physician (Oncology) Pickenpack-Cousar, Fannie SAILOR, NP as Nurse Practitioner (Hospice and Palliative Medicine) Dewey Rush, MD as Consulting Physician (Radiation Oncology) Therisa Bi, MD as Consulting Physician (Gastroenterology) Isadora Hose, MD as Consulting Physician (Pulmonary Disease)    INTERVAL HISTORY: Erica [name] is a 58 y.o. 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 Palliative ask to see for symptom management and goals of care.   SOCIAL HISTORY:     reports that she has been smoking cigarettes. She has a 138 pack-year smoking history. She has never used smokeless tobacco. She reports current alcohol use. She reports that she does not use drugs.  ADVANCE DIRECTIVES:  None on file  CODE STATUS: Full code  PAST MEDICAL HISTORY: Past Medical History:  Diagnosis Date   (HFpEF) heart failure with preserved ejection fraction (HCC)    a. 05/2015 Echo: EF 60-65%, no rwma, PASP .   Acute pancreatitis 08/13/2018   Anxiety    Arthritis    Asthma    Bell's palsy    no deficit   Bronchitis    Cancer (HCC) 12/2022   rectal cancer   CHF (congestive heart failure) (HCC)    COPD (chronic obstructive pulmonary  disease) (HCC)    Depression    Diabetes mellitus without complication (HCC)    type II 03/2017   Dyspnea    Fatty liver    per patient   Gall stones    per patient   Gastric ulcer    Hyperlipidemia    Hypertension    Lung nodule 12/2022   upper left   OSA (obstructive sleep apnea)    a. did not tolerate CPAP. On oxygen 2L via [place]   Pancreatitis    07/2018   Shoulder injury    6/19   Smoker     ALLERGIES:  is allergic to tramadol and penicillins.  MEDICATIONS:  Current Outpatient Medications  Medication Sig Dispense Refill   albuterol  (VENTOLIN  HFA) 108 (90 Base) MCG/ACT inhaler Inhale 2 puffs into the lungs every 6 (six) hours as needed for wheezing or shortness of breath.      Blood Glucose Monitoring Suppl (ACCU-CHEK AVIVA PLUS) w/Device KIT See admin instructions.  0   busPIRone  (BUSPAR ) 30 MG tablet Take 30 mg by mouth 2 (two) times daily. PER PT STATES HER DOSE WAS INCREASED     capecitabine  (XELODA ) 500 MG tablet Take 3 tablets (1,500 mg total) by mouth 2 (two) times daily after a meal. Take on days of radiation only, Monday through Friday 90 tablet 1   clonazePAM  (KLONOPIN ) 0.5 MG tablet Take 0.5 mg by mouth at bedtime as needed for anxiety.     diltiazem  (CARDIZEM  CD) 120 MG 24 hr capsule TAKE 1 CAPSULE BY MOUTH EVERY DAY 180 capsule 2   diltiazem  (CARDIZEM ) 30 MG tablet TAKE 1 TABLET(30  MG) BY MOUTH THREE TIMES DAILY AS NEEDED 90 tablet 3   doxycycline  (VIBRA -TABS) 100 MG tablet Take 1 tablet (100 mg total) by mouth 2 (two) times daily. 20 tablet 0   DULoxetine  (CYMBALTA ) 60 MG capsule Take 60 mg by mouth 2 (two) times daily.      ezetimibe  (ZETIA ) 10 MG tablet Take 10 mg by mouth at bedtime.     fluconazole  (DIFLUCAN ) 150 MG tablet Take 1 tablet po one time for yeast infection/thrush. May repeat dose in 3 days for persistent symptoms. 3 tablet 0   Fluticasone -Salmeterol (ADVAIR) 250-50 MCG/DOSE AEPB Inhale 1 puff into the lungs 2 (two) times daily.     furosemide   (LASIX ) 40 MG tablet TAKE 1 TABLET BY MOUTH EVERY DAY. MAY TAKE ADDITIONAL TABLET IN THE AM FOR WEIGHT GAIN AS NEEDED 110 tablet 2   JARDIANCE 25 MG TABS tablet Take 25 mg by mouth daily.     lidocaine -prilocaine  (EMLA ) cream Apply to affected area once 30 g 3   magic mouthwash (nystatin , lidocaine , diphenhydrAMINE, alum & mag hydroxide) suspension Take 5 mLs by mouth 4 (four) times daily as needed for mouth pain. 140 mL 1   metFORMIN  (GLUCOPHAGE -XR) 500 MG 24 hr tablet Take 1,000 mg by mouth 2 (two) times daily.     ondansetron  (ZOFRAN ) 8 MG tablet Take 1 tablet (8 mg total) by mouth every 8 (eight) hours as needed for nausea, vomiting or refractory nausea / vomiting. Start on the third day after chemotherapy. 30 tablet 1   Oxycodone  HCl 10 MG TABS Take 1 tablet (10 mg total) by mouth 3 (three) times daily as needed. 90 tablet 0   OXYGEN Inhale 2 L into the lungs at bedtime. Or as needed throughout the day if short of breath     pantoprazole  (PROTONIX ) 40 MG tablet Take 1 tablet (40 mg total) by mouth daily. 30 tablet 0   potassium chloride  SA (KLOR-CON ) 20 MEQ tablet Take 20 mEq by mouth 2 (two) times daily.     pregabalin  (LYRICA ) 150 MG capsule Take 1 capsule (150 mg total) by mouth 3 (three) times daily. 90 capsule 0   prochlorperazine  (COMPAZINE ) 10 MG tablet Take 1 tablet (10 mg total) by mouth every 6 (six) hours as needed for nausea or vomiting. 30 tablet 1   spironolactone  (ALDACTONE ) 25 MG tablet TAKE 1 TABLET(25 MG) BY MOUTH EVERY MORNING 90 tablet 3   sucralfate  (CARAFATE ) 1 G tablet Take 1 tablet (1 g total) by mouth 4 (four) times daily -  with meals and at bedtime. (Patient taking differently: Take 1 g by mouth 2 (two) times daily.) 120 tablet 0   tiotropium (SPIRIVA ) 18 MCG inhalation capsule Place 18 mcg into inhaler and inhale daily.     tiZANidine  (ZANAFLEX ) 4 MG tablet Take 8 mg by mouth 3 (three) times daily.     Varenicline  Tartrate, Starter, 0.5 MG X 11 & 1 MG X 42 TBPK See  admin instructions. follow package directions     zolpidem (AMBIEN) 10 MG tablet Take 10 mg by mouth at bedtime as needed for sleep.     No current facility-administered medications for this visit.    VITAL SIGNS: LMP 03/13/2015 (Approximate) Comment: more than 2 years ago There were no vitals filed for this visit.  Estimated body mass index is 25.59 kg/m (pended) as calculated from the following:   Height as of 07/06/23: (P) 5' 8 (1.727 m).   Weight as of 07/06/23: (P) 168  lb 4.8 oz (76.3 kg).   PERFORMANCE STATUS (ECOG) : 1 - Symptomatic but completely ambulatory   Physical Exam General: NAD Cardiovascular: regular rate and rhythm Pulmonary: clear ant fields Abdomen: soft, nontender, + bowel sounds Extremities: no edema, no joint deformities Skin: no rashes Neurological: AAO x3  IMPRESSION:  Erica [name] presents to clinic for follow-up. No acute distress. She is doing well overall. Reports a stable bowel pattern, with no constipation or diarrhea. She has been eating better recently. However, she experienced an episode of nausea and vomiting this morning due to a severe coughing fit. The coughing was described as an attempt to clear 'old crud.' The patient is also scheduled for radiation therapy. Tolerating well. Some fatigue however pushes thru as best possible.   The patient reports pain and discomfort is well controlled on current regimen of oxycodone  and Lyrica . She is taking as prescribed. No changes at this time.   Will continue to closely monitor and support.  Goals of care  03/31/23- We discussed her current illness and what it means in the larger context of her on-going co-morbidities. Natural disease trajectory and expectations were discussed.   Erica [name] is realistic in her understanding.  Verbalize goal is to continue to treat the treatable allow her every opportunity to continue to thrive .  Is taking things oneday at a time.  We discussed Her current illness  and what it means in the larger context of Her on-going co-morbidities. Natural disease trajectory and expectations were discussed.  I discussed the importance of continued conversation with family and their medical providers regarding overall plan of care and treatment options, ensuring decisions are within the context of the patients values and GOCs.   Assessment and Plan  Chronic Cancer Related Pain Stable on current regimen of Oxycodone . No issues with constipation or diarrhea. -Refill Oxycodone  prescription to be picked up at [facility] on 07/30/2023.  Neuropathic Pain Stable on Lyrica  three times a day. -Refill Lyrica  prescription.  Nausea/Vomiting Single episode of vomiting associated with coughing, no ongoing nausea or vomiting. -No changes to current management.  Radiation Therapy Ongoing treatment. -Continue as planned.  Follow-up in 3-4 weeks or sooner if needed.   Patient expressed understanding and was in agreement with this plan. She also understands that She can call the clinic at any time with any questions, concerns, or complaints.   Any controlled substances utilized were prescribed in the context of palliative care. PDMP has been reviewed.   Visit consisted of counseling and education dealing with the complex and emotionally intense issues of symptom management and palliative care in the setting of serious and potentially life-threatening illness.  Levon Borer, AGPCNP-BC  Palliative Medicine Team/[facility]

## 2023-07-26 NOTE — Telephone Encounter (Signed)
 Oral Chemotherapy Pharmacist Encounter   Spoke with patient regarding drug-drug interaction between Xeloda  and pantoprazole .  Re-educated patient to not take pantoprazole  while she is on Xeloda  as the pantoprazole  can decrease efficacy of Xeloda . Educated patient she is OK to take famotidine  or tums PRN or scheduled, if needed, while on Xeloda .   Patient expressed appreciation and understanding. Patient knows to call the office with any questions or concerns.   Asberry Macintosh, PharmD, BCPS, BCOP Hematology/Oncology Clinical Pharmacist [facility] Oral Chemotherapy Navigation Clinics [phone] 07/26/2023 1:45 PM

## 2023-07-26 NOTE — Telephone Encounter (Signed)
 Oral Chemotherapy Pharmacist Encounter   Received staff message from rad/onc regarding patient inquiring about what medication she is supposed to hold while on Xeloda .   Attempted to call patient to discuss again, holding the pantoprazole  while on Xeloda , due to PPIs decreasing efficacy of Xeloda .   Patient did not answer and does not have voicemail set up, will try calling back later.  No answer.   Asberry Macintosh, PharmD, BCPS, BCOP Hematology/Oncology Clinical Pharmacist [facility] and [facility] Oral Chemotherapy Navigation Clinics [phone] 07/26/2023 9:30 AM

## 2023-07-26 NOTE — Telephone Encounter (Signed)
Scheduled appointments per scheduling message. Patient is aware of the made appointments.  

## 2023-07-27 ENCOUNTER — Other Ambulatory Visit: Payer: Self-pay

## 2023-07-27 ENCOUNTER — Ambulatory Visit
Admission: RE | Admit: 2023-07-27 | Discharge: 2023-07-27 | Disposition: A | Discharge status: Home / Self Care | Payer: Medicaid Other | Source: Ambulatory Visit | Attending: Radiation Oncology | Admitting: Radiation Oncology

## 2023-07-27 DIAGNOSIS — Z510 Encounter for antineoplastic radiation therapy: Secondary | ICD-10-CM | POA: Diagnosis not present

## 2023-07-27 LAB — RAD ONC ARIA SESSION SUMMARY
Course Elapsed Days: 2
Plan Fractions Treated to Date: 3
Plan Prescribed Dose Per Fraction: 1.8 Gy
Plan Total Fractions Prescribed: 25
Plan Total Prescribed Dose: 45 Gy
Reference Point Dosage Given to Date: 5.4 Gy
Reference Point Session Dosage Given: 1.8 Gy
Session Number: 3

## 2023-07-28 ENCOUNTER — Inpatient Hospital Stay: Payer: Medicaid Other | Admitting: Hematology

## 2023-07-28 ENCOUNTER — Other Ambulatory Visit: Payer: Self-pay

## 2023-07-28 ENCOUNTER — Other Ambulatory Visit: Payer: Self-pay | Admitting: *Deleted

## 2023-07-28 ENCOUNTER — Inpatient Hospital Stay (HOSPITAL_BASED_OUTPATIENT_CLINIC_OR_DEPARTMENT_OTHER): Payer: Medicaid Other | Admitting: Nurse Practitioner

## 2023-07-28 ENCOUNTER — Inpatient Hospital Stay: Payer: Medicaid Other

## 2023-07-28 ENCOUNTER — Encounter: Payer: Self-pay | Admitting: Nurse Practitioner

## 2023-07-28 ENCOUNTER — Ambulatory Visit
Admission: RE | Admit: 2023-07-28 | Discharge: 2023-07-28 | Disposition: A | Discharge status: Home / Self Care | Payer: Medicaid Other | Source: Ambulatory Visit | Attending: Radiation Oncology | Admitting: Radiation Oncology

## 2023-07-28 VITALS — BP 147/73 | HR 90 | Temp 97.9°F | Resp 17 | Wt 168.7 lb

## 2023-07-28 DIAGNOSIS — C7800 Secondary malignant neoplasm of unspecified lung: Secondary | ICD-10-CM | POA: Insufficient documentation

## 2023-07-28 DIAGNOSIS — C20 Malignant neoplasm of rectum: Secondary | ICD-10-CM | POA: Insufficient documentation

## 2023-07-28 DIAGNOSIS — Z515 Encounter for palliative care: Secondary | ICD-10-CM

## 2023-07-28 DIAGNOSIS — Z510 Encounter for antineoplastic radiation therapy: Secondary | ICD-10-CM | POA: Diagnosis not present

## 2023-07-28 DIAGNOSIS — R530 Neoplastic (malignant) related fatigue: Secondary | ICD-10-CM

## 2023-07-28 DIAGNOSIS — Z95828 Presence of other vascular implants and grafts: Secondary | ICD-10-CM

## 2023-07-28 DIAGNOSIS — G893 Neoplasm related pain (acute) (chronic): Secondary | ICD-10-CM | POA: Diagnosis not present

## 2023-07-28 DIAGNOSIS — M792 Neuralgia and neuritis, unspecified: Secondary | ICD-10-CM

## 2023-07-28 DIAGNOSIS — Z452 Encounter for adjustment and management of vascular access device: Secondary | ICD-10-CM | POA: Insufficient documentation

## 2023-07-28 LAB — CBC WITH DIFFERENTIAL (CANCER CENTER ONLY)
Abs Immature Granulocytes: 0.06 10*3/uL (ref 0.00–0.07)
Basophils Absolute: 0.1 10*3/uL (ref 0.0–0.1)
Basophils Relative: 1 %
Eosinophils Absolute: 0.1 10*3/uL (ref 0.0–0.5)
Eosinophils Relative: 1 %
HCT: 40.2 % (ref 36.0–46.0)
Hemoglobin: 13.3 g/dL (ref 12.0–15.0)
Immature Granulocytes: 1 %
Lymphocytes Relative: 13 %
Lymphs Abs: 1.2 10*3/uL (ref 0.7–4.0)
MCH: 30.9 pg (ref 26.0–34.0)
MCHC: 33.1 g/dL (ref 30.0–36.0)
MCV: 93.5 fL (ref 80.0–100.0)
Monocytes Absolute: 0.7 10*3/uL (ref 0.1–1.0)
Monocytes Relative: 8 %
Neutro Abs: 6.9 10*3/uL (ref 1.7–7.7)
Neutrophils Relative %: 76 %
Platelet Count: 156 10*3/uL (ref 150–400)
RBC: 4.3 MIL/uL (ref 3.87–5.11)
RDW: 18.8 % — ABNORMAL HIGH (ref 11.5–15.5)
WBC Count: 9 10*3/uL (ref 4.0–10.5)
nRBC: 0 % (ref 0.0–0.2)

## 2023-07-28 LAB — CMP (CANCER CENTER ONLY)
ALT: 23 U/L (ref 0–44)
AST: 22 U/L (ref 15–41)
Albumin: 3.8 g/dL (ref 3.5–5.0)
Alkaline Phosphatase: 88 U/L (ref 38–126)
Anion gap: 6 (ref 5–15)
BUN: 9 mg/dL (ref 6–20)
CO2: 28 mmol/L (ref 22–32)
Calcium: 9.2 mg/dL (ref 8.9–10.3)
Chloride: 102 mmol/L (ref 98–111)
Creatinine: 0.68 mg/dL (ref 0.44–1.00)
GFR, Estimated: >60 mL/min (ref >60)
Glucose, Bld: 178 mg/dL — ABNORMAL HIGH (ref 70–99)
Potassium: 4.2 mmol/L (ref 3.5–5.1)
Sodium: 136 mmol/L (ref 135–145)
Total Bilirubin: 0.5 mg/dL (ref 0.0–1.2)
Total Protein: 6.6 g/dL (ref 6.5–8.1)

## 2023-07-28 LAB — RAD ONC ARIA SESSION SUMMARY
Course Elapsed Days: 3
Plan Fractions Treated to Date: 4
Plan Prescribed Dose Per Fraction: 1.8 Gy
Plan Total Fractions Prescribed: 25
Plan Total Prescribed Dose: 45 Gy
Reference Point Dosage Given to Date: 7.2 Gy
Reference Point Session Dosage Given: 1.8 Gy
Session Number: 4

## 2023-07-28 LAB — CEA (ACCESS): CEA (CHCC): 12.87 ng/mL — ABNORMAL HIGH (ref 0.00–5.00)

## 2023-07-28 LAB — TOTAL PROTEIN, URINE DIPSTICK: Protein, ur: NEGATIVE mg/dL

## 2023-07-28 MED ORDER — HEPARIN SOD (PORK) LOCK FLUSH 100 UNIT/ML IV SOLN
500.0000 [IU] | Freq: Once | INTRAVENOUS | Status: AC
  Administered 2023-07-28: 500 [IU]

## 2023-07-28 MED ORDER — PREGABALIN 150 MG PO CAPS: 150.0000 mg | ORAL_CAPSULE | Freq: Three times a day (TID) | ORAL | 0 refills | Status: DC

## 2023-07-28 MED ORDER — SODIUM CHLORIDE 0.9% FLUSH
10.0000 mL | Freq: Once | INTRAVENOUS | Status: AC
Start: 2023-07-28 — End: 2023-07-28
  Administered 2023-07-28: 10 mL

## 2023-07-28 MED ORDER — OXYCODONE HCL 10 MG PO TABS: 10.0000 mg | ORAL_TABLET | Freq: Three times a day (TID) | ORAL | 0 refills | Status: DC | PRN

## 2023-07-28 NOTE — Progress Notes (Signed)
 [facility]   [phone]) [phone] [phone]   Clinic Follow up Note   Patient Care Team: System, Provider Not In as PCP - General Gollan, Evalene PARAS, MD as PCP - Cardiology (Cardiology) Donette Ellouise LABOR, FNP as Nurse Practitioner (Family Medicine) Perla, Evalene PARAS, MD as Consulting Physician (Cardiology) Linard Alm NOVAK, MD (Inactive) as Consulting Physician (Pulmonary Disease) Cindie Jesusa HERO, RN as Registered Nurse Dannielle Arlean FALCON, RN (Inactive) as Registered Nurse Maurie Rayfield BIRCH, RN as Oncology Nurse Navigator Lanny Callander, MD as Consulting Physician (Oncology) Pickenpack-Cousar, Fannie SAILOR, NP as Nurse Practitioner (Hospice and Palliative Medicine) Dewey Rush, MD as Consulting Physician (Radiation Oncology) Therisa Bi, MD as Consulting Physician (Gastroenterology) Isadora Hose, MD as Consulting Physician (Pulmonary Disease)  Date of Service:  07/28/2023  CHIEF COMPLAINT: f/u of rectal cancer  CURRENT THERAPY:  Concurrent chemoradiation with Xeloda   Oncology History   Rectal cancer (HCC) cT3cN2M1, stage IV,with oligo lung metastasis, intact MMR, KRAS G12V (+) -We reviewed her medical record and workup in detail with the patient and her daughter.  She presented with hematochezia since 05/2022 -Workup showed a near obstructing rectal mass. Path confirmed adenocarcinoma. Baseline CEA elevated to 18.1.  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  I added bevacizumab  to her chemo from cycle 3 -restaging CT 11/9 showed good PR, she completed a total of 8 cycle chemo then started concurrent chemoRT to rectal cancer on 07/25/2023    Assessment and  Plan    Rectal Cancer 58 year old with rectal cancer undergoing treatment with Xeloda  (oral chemotherapy) and scheduled to start radiation therapy on Monday. No reported side effects from chemotherapy. Blood count is normal, urine protein is negative. Pending kidney and liver function tests. Radiation therapy for lung metastasis is planned for the last week and a half of the current treatment cycle. - Continue Xeloda  as prescribed: three pills twice a day after meals, Monday through Friday. - Discontinue urine tests during radiation therapy. - Schedule follow-up appointments every other week during chemotherapy. - Next follow-up appointment on August 11, 2023.  Lung Metastasis Lung metastasis from rectal cancer. Radiation therapy for the lung is planned for the last week and a half of the current treatment cycle. - Proceed with planned radiation therapy for lung metastasis towards the end of the current treatment cycle.  Plan -First week of chemo and radiation, she is tolerating well, will continue -next follow-up appointment on August 11, 2023.         SUMMARY OF ONCOLOGIC HISTORY: Oncology History Overview Note   Cancer Staging  Rectal cancer [facility]) Staging form: Colon and Rectum, AJCC 8th Edition - Clinical stage from 12/23/2022: Stage IVA (cT3, cN2, pM1a) - Signed by Babara Call, MD on 02/04/2023 Stage prefix: Initial diagnosis     Rectal cancer (HCC)  12/15/2022 Pathology Results   Colonoscopy:   Positive for invasive adenocarcinoma, G1 well differentiated      12/15/2022 Procedure   Colonoscopy under the care of Dr. Bi   Findings: -The perianal and digital rectal examinations were normal. -Extensive amounts of liquid semi- liquid semi- solid stool was found in the entire colon -A partially obstructing large mass was found in the rectum. The mass was circumferential.  12/23/2022 Initial Diagnosis   Rectal cancer [facility]) Patient was seen by gastroenterology due to  hematochezia as well as unintentional weight loss.  12/15/2022, colonoscopy showed fungating partially obstructing large mass in the rectum, circumferential, mass extended from about 15 cm from anus to about 8 cm from anal verge.  Biopsy was taken. Poor preparation of colon.  Colonoscopy was repeated on 12/16/2022, preparations inadequate.  Pathology showed invasive adenocarcinoma.   12/23/2022 Cancer Staging   Staging form: Colon and Rectum, AJCC 8th Edition - Clinical stage from 12/23/2022: Stage Unknown (cTX, cNX) - Signed by Babara Call, MD on 12/23/2022 Stage prefix: Initial diagnosis  Staging form: Colon and Rectum, AJCC 8th Edition - Clinical stage from 12/23/2022: Stage IVA (cT3, cN2, pM1a) - Signed by Babara Call, MD on 02/04/2023 Stage prefix: Initial diagnosis   12/23/2022 Tumor Marker   Patient's tumor was tested for the following markers: CEA. Results of the tumor marker test revealed elevated at 18.1.   01/03/2023 Imaging   CT chest abdomen with contrast showed 2.2 cm left upper lobe nodule, suspicious for metastasis. No findings suspicious for metastatic disease in the abdomen. Bilateral adrenal adenomas, benign. Cholelithiasis, without associated inflammatory changes. Aortic Atherosclerosis (ICD10-I70.0) and Emphysema   01/08/2023 Imaging   MRI pelvis without contrast-rectal protocol showed 9.2 cm concentric mid/lower rectal mass, corresponding to the patient's known primary rectal adenocarcinoma, as above. Rectal adenocarcinoma T stage: T3c Rectal adenocarcinoma N stage:  N2 Distance from tumor to the internal anal sphincter is 3.4 cm.   01/27/2023 Procedure   Patient underwent biopsy via bronchoscopy by pulmonology. Biopsy pathology showed adenocarcinoma, Immunohistochemistry is positive with cytokeratin 20 and CDX2 and negative with cytokeratin 7, TTF-1 and napsin A consistent with colonic adenocarcinoma    01/27/2023 Pathology Results   CASE: [id number]   FINAL MICROSCOPIC  DIAGNOSIS:  A. LUNG, LUL, FINE NEEDLE ASPIRATION:  Adenocarcinoma  See comment   ADDENDUM:  Immunohistochemistry is positive with cytokeratin 20 and CDX2  and negative with cytokeratin 7, TTF-1 and napsin A consistent with  colonic adenocarcinoma.    02/28/2023 Miscellaneous      03/03/2023 -  Chemotherapy   Patient is on Treatment Plan : COLORECTAL FOLFIRI q14d     05/13/2023 Imaging   CT chest, abdomen, and pelvis with contrast IMPRESSION: 1. Response to therapy of rectal primary, pelvic nodal, and isolated pulmonary metastasis. 2. Moderate amount of stool upstream from the rectal primary. Partial obstruction cannot be excluded. 3. No new or progressive disease. 4. Right Port-A-Cath with anterior SVC pericatheter small volume thrombus. 5. Age advanced coronary artery atherosclerosis. Recommend assessment of coronary risk factors. 6. Incidental findings, including: Cholelithiasis. Aortic Atherosclerosis (ICD10-I70.0).      Discussed the use of AI scribe software for clinical note transcription with the patient, who gave verbal consent to proceed.  History of Present Illness   The patient, a 58 year old with a history of rectal cancer, presents for a follow-up visit. She has recently started radiation therapy and oral chemotherapy with Xeloda , taken as three pills twice a day after meals, Monday through Friday. She reports no noticeable side effects from the treatment thus far.         All other systems were reviewed with the patient and are negative.  MEDICAL HISTORY:  Past Medical History:  Diagnosis Date   (HFpEF) heart failure with preserved ejection fraction (HCC)    a. 05/2015 Echo: EF 60-65%, no rwma, PASP .   Acute pancreatitis 08/13/2018   Anxiety  Arthritis    Asthma    Bell's palsy    no deficit   Bronchitis    Cancer (HCC) 12/2022   rectal cancer   CHF (congestive heart failure) (HCC)    COPD (chronic obstructive pulmonary disease) (HCC)     Depression    Diabetes mellitus without complication (HCC)    type II 03/2017   Dyspnea    Fatty liver    per patient   Gall stones    per patient   Gastric ulcer    Hyperlipidemia    Hypertension    Lung nodule 12/2022   upper left   OSA (obstructive sleep apnea)    a. did not tolerate CPAP. On oxygen 2L via    Pancreatitis    07/2018   Shoulder injury    6/19   Smoker     SURGICAL HISTORY: Past Surgical History:  Procedure Laterality Date   BRONCHIAL NEEDLE ASPIRATION BIOPSY  01/27/2023   Procedure: BRONCHIAL NEEDLE ASPIRATION BIOPSIES;  Surgeon: Isadora Hose, MD;  Location: [facility] ENDOSCOPY;  Service: Pulmonary;;   COLONOSCOPY WITH PROPOFOL  N/A 12/15/2022   Procedure: COLONOSCOPY WITH PROPOFOL ;  Surgeon: Therisa Bi, MD;  Location: [facility] ENDOSCOPY;  Service: Gastroenterology;  Laterality: N/A;   COLONOSCOPY WITH PROPOFOL  N/A 12/16/2022   Procedure: COLONOSCOPY WITH PROPOFOL ;  Surgeon: Therisa Bi, MD;  Location: [facility] ENDOSCOPY;  Service: Gastroenterology;  Laterality: N/A;   FLEXIBLE BRONCHOSCOPY N/A 06/20/2015   Procedure: FLEXIBLE BRONCHOSCOPY;  Surgeon: Laurance Flor, MD;  Location: [facility] ORS;  Service: Pulmonary;  Laterality: N/A;   IR IMAGING GUIDED PORT INSERTION  02/25/2023   KNEE SURGERY Left    8 knee surgeries    I have reviewed the social history and family history with the patient and they are unchanged from previous note.  ALLERGIES:  is allergic to tramadol and penicillins.  MEDICATIONS:  Current Outpatient Medications  Medication Sig Dispense Refill   albuterol  (VENTOLIN  HFA) 108 (90 Base) MCG/ACT inhaler Inhale 2 puffs into the lungs every 6 (six) hours as needed for wheezing or shortness of breath.      Blood Glucose Monitoring Suppl (ACCU-CHEK AVIVA PLUS) w/Device KIT See admin instructions.  0   busPIRone  (BUSPAR ) 30 MG tablet Take 30 mg by mouth 2 (two) times daily. PER PT STATES HER DOSE WAS INCREASED     capecitabine  (XELODA ) 500 MG tablet Take 3  tablets (1,500 mg total) by mouth 2 (two) times daily after a meal. Take on days of radiation only, Monday through Friday 90 tablet 1   clonazePAM  (KLONOPIN ) 0.5 MG tablet Take 0.5 mg by mouth at bedtime as needed for anxiety.     diltiazem  (CARDIZEM  CD) 120 MG 24 hr capsule TAKE 1 CAPSULE BY MOUTH EVERY DAY 180 capsule 2   diltiazem  (CARDIZEM ) 30 MG tablet TAKE 1 TABLET(30 MG) BY MOUTH THREE TIMES DAILY AS NEEDED 90 tablet 3   doxycycline  (VIBRA -TABS) 100 MG tablet Take 1 tablet (100 mg total) by mouth 2 (two) times daily. 20 tablet 0   DULoxetine  (CYMBALTA ) 60 MG capsule Take 60 mg by mouth 2 (two) times daily.      ezetimibe  (ZETIA ) 10 MG tablet Take 10 mg by mouth at bedtime.     fluconazole  (DIFLUCAN ) 150 MG tablet Take 1 tablet po one time for yeast infection/thrush. May repeat dose in 3 days for persistent symptoms. 3 tablet 0   Fluticasone -Salmeterol (ADVAIR) 250-50 MCG/DOSE AEPB Inhale 1 puff into the lungs 2 (two) times daily.  furosemide  (LASIX ) 40 MG tablet TAKE 1 TABLET BY MOUTH EVERY DAY. MAY TAKE ADDITIONAL TABLET IN THE AM FOR WEIGHT GAIN AS NEEDED 110 tablet 2   JARDIANCE 25 MG TABS tablet Take 25 mg by mouth daily.     lidocaine -prilocaine  (EMLA ) cream Apply to affected area once 30 g 3   magic mouthwash (nystatin , lidocaine , diphenhydrAMINE, alum & mag hydroxide) suspension Take 5 mLs by mouth 4 (four) times daily as needed for mouth pain. 140 mL 1   metFORMIN  (GLUCOPHAGE -XR) 500 MG 24 hr tablet Take 1,000 mg by mouth 2 (two) times daily.     ondansetron  (ZOFRAN ) 8 MG tablet Take 1 tablet (8 mg total) by mouth every 8 (eight) hours as needed for nausea, vomiting or refractory nausea / vomiting. Start on the third day after chemotherapy. 30 tablet 1   [START ON 07/30/2023] Oxycodone  HCl 10 MG TABS Take 1 tablet (10 mg total) by mouth 3 (three) times daily as needed. 90 tablet 0   OXYGEN Inhale 2 L into the lungs at bedtime. Or as needed throughout the day if short of breath      pantoprazole  (PROTONIX ) 40 MG tablet Take 1 tablet (40 mg total) by mouth daily. 30 tablet 0   potassium chloride  SA (KLOR-CON ) 20 MEQ tablet Take 20 mEq by mouth 2 (two) times daily.     pregabalin  (LYRICA ) 150 MG capsule Take 1 capsule (150 mg total) by mouth 3 (three) times daily. 90 capsule 0   prochlorperazine  (COMPAZINE ) 10 MG tablet Take 1 tablet (10 mg total) by mouth every 6 (six) hours as needed for nausea or vomiting. 30 tablet 1   spironolactone  (ALDACTONE ) 25 MG tablet TAKE 1 TABLET(25 MG) BY MOUTH EVERY MORNING 90 tablet 3   sucralfate  (CARAFATE ) 1 G tablet Take 1 tablet (1 g total) by mouth 4 (four) times daily -  with meals and at bedtime. (Patient taking differently: Take 1 g by mouth 2 (two) times daily.) 120 tablet 0   tiotropium (SPIRIVA ) 18 MCG inhalation capsule Place 18 mcg into inhaler and inhale daily.     tiZANidine  (ZANAFLEX ) 4 MG tablet Take 8 mg by mouth 3 (three) times daily.     Varenicline  Tartrate, Starter, 0.5 MG X 11 & 1 MG X 42 TBPK See admin instructions. follow package directions     zolpidem (AMBIEN) 10 MG tablet Take 10 mg by mouth at bedtime as needed for sleep.     No current facility-administered medications for this visit.    PHYSICAL EXAMINATION: ECOG PERFORMANCE STATUS: 1 - Symptomatic but completely ambulatory  Vitals:   07/28/23 1258 07/28/23 1259  BP: (!) 158/88 (!) 147/73  Pulse: 90   Resp: 17   Temp: 97.9 F (36.6 C)   SpO2: 99%    Wt Readings from Last 3 Encounters:  07/28/23 168 lb 11.2 oz (76.5 kg)  07/06/23 168 lb 3.2 oz (76.3 kg)  07/06/23 (P) 168 lb 4.8 oz (76.3 kg)     GENERAL:alert, no distress and comfortable SKIN: skin color, texture, turgor are normal, no rashes or significant lesions EYES: normal, Conjunctiva are pink and non-injected, sclera clear NECK: supple, thyroid normal size, non-tender, without nodularity LYMPH:  no palpable lymphadenopathy in the cervical, axillary  LUNGS: clear to auscultation and percussion  with normal breathing effort HEART: regular rate & rhythm and no murmurs and no lower extremity edema ABDOMEN:abdomen soft, non-tender and normal bowel sounds Musculoskeletal:no cyanosis of digits and no clubbing  NEURO: alert &  oriented x 3 with fluent speech, no focal motor/sensory deficits    LABORATORY DATA:  I have reviewed the data as listed    Latest Ref Rng & Units 07/28/2023   12:40 PM 07/04/2023    8:37 AM 06/09/2023   12:23 PM  CBC  WBC 4.0 - 10.5 K/uL 9.0  8.4  9.0   Hemoglobin 12.0 - 15.0 g/dL 86.6  86.6  86.5   Hematocrit 36.0 - 46.0 % 40.2  41.5  41.9   Platelets 150 - 400 K/uL 156  190  248         Latest Ref Rng & Units 07/28/2023   12:40 PM 07/04/2023    8:37 AM 06/09/2023   12:23 PM  CMP  Glucose 70 - 99 mg/dL 821  767  790   BUN 6 - 20 mg/dL 9  8  7    Creatinine 0.44 - 1.00 mg/dL 9.31  9.25  9.23   Sodium 135 - 145 mmol/L 136  137  137   Potassium 3.5 - 5.1 mmol/L 4.2  4.2  4.4   Chloride 98 - 111 mmol/L 102  104  101   CO2 22 - 32 mmol/L 28  25  28    Calcium  8.9 - 10.3 mg/dL 9.2  9.2  9.7   Total Protein 6.5 - 8.1 g/dL 6.6  6.3  7.4   Total Bilirubin 0.0 - 1.2 mg/dL 0.5  0.4  0.3   Alkaline Phos 38 - 126 U/L 88  84  150   AST 15 - 41 U/L 22  13  12    ALT 0 - 44 U/L 23  13  16        RADIOGRAPHIC STUDIES: I have personally reviewed the radiological images as listed and agreed with the findings in the report. No results found.    Orders Placed This Encounter  Procedures   CBC with Differential/Platelet    Standing Status:   Standing    Number of Occurrences:   50    Expiration Date:   07/27/2024   Comprehensive metabolic panel    Standing Status:   Standing    Number of Occurrences:   50    Expiration Date:   07/27/2024   All questions were answered. The patient knows to call the clinic with any problems, questions or concerns. No barriers to learning was detected. The total time spent in the appointment was 20 minutes.     Onita Mattock,  MD 07/28/2023

## 2023-07-28 NOTE — Assessment & Plan Note (Addendum)
 [id number], stage IV,with oligo lung metastasis, intact MMR, KRAS G12V (+) -We reviewed her medical record and workup in detail with the patient and her daughter.  She presented with hematochezia since 05/2022 -Workup showed a near obstructing rectal mass. Path confirmed adenocarcinoma. Baseline CEA elevated to 18.1.  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  I added bevacizumab to her chemo from cycle 3 -restaging CT 11/9 showed good PR, she completed a total of 8 cycle chemo then started concurrent chemoRT to rectal cancer on 07/25/2023

## 2023-07-29 ENCOUNTER — Ambulatory Visit
Admission: RE | Admit: 2023-07-29 | Discharge: 2023-07-29 | Disposition: A | Discharge status: Home / Self Care | Payer: Medicaid Other | Source: Ambulatory Visit | Attending: Radiation Oncology

## 2023-07-29 ENCOUNTER — Other Ambulatory Visit: Payer: Self-pay

## 2023-07-29 DIAGNOSIS — Z510 Encounter for antineoplastic radiation therapy: Secondary | ICD-10-CM | POA: Diagnosis not present

## 2023-07-29 LAB — RAD ONC ARIA SESSION SUMMARY
Course Elapsed Days: 4
Plan Fractions Treated to Date: 5
Plan Prescribed Dose Per Fraction: 1.8 Gy
Plan Total Fractions Prescribed: 25
Plan Total Prescribed Dose: 45 Gy
Reference Point Dosage Given to Date: 9 Gy
Reference Point Session Dosage Given: 1.8 Gy
Session Number: 5

## 2023-07-30 ENCOUNTER — Encounter: Payer: Self-pay | Admitting: Hematology

## 2023-08-01 ENCOUNTER — Other Ambulatory Visit: Payer: Self-pay

## 2023-08-01 ENCOUNTER — Ambulatory Visit
Admission: RE | Admit: 2023-08-01 | Discharge: 2023-08-01 | Disposition: A | Discharge status: Home / Self Care | Payer: Medicaid Other | Source: Ambulatory Visit | Attending: Radiation Oncology

## 2023-08-01 DIAGNOSIS — Z510 Encounter for antineoplastic radiation therapy: Secondary | ICD-10-CM | POA: Diagnosis not present

## 2023-08-01 LAB — RAD ONC ARIA SESSION SUMMARY
Course Elapsed Days: 7
Plan Fractions Treated to Date: 6
Plan Prescribed Dose Per Fraction: 1.8 Gy
Plan Total Fractions Prescribed: 25
Plan Total Prescribed Dose: 45 Gy
Reference Point Dosage Given to Date: 10.8 Gy
Reference Point Session Dosage Given: 1.8 Gy
Session Number: 6

## 2023-08-02 ENCOUNTER — Other Ambulatory Visit: Payer: Self-pay

## 2023-08-02 ENCOUNTER — Ambulatory Visit: Payer: Medicaid Other

## 2023-08-03 ENCOUNTER — Other Ambulatory Visit: Payer: Self-pay

## 2023-08-03 ENCOUNTER — Ambulatory Visit
Admission: RE | Admit: 2023-08-03 | Discharge: 2023-08-03 | Disposition: A | Discharge status: Home / Self Care | Payer: Medicaid Other | Source: Ambulatory Visit | Attending: Radiation Oncology | Admitting: Radiation Oncology

## 2023-08-03 DIAGNOSIS — Z510 Encounter for antineoplastic radiation therapy: Secondary | ICD-10-CM | POA: Diagnosis not present

## 2023-08-03 LAB — RAD ONC ARIA SESSION SUMMARY
Course Elapsed Days: 9
Plan Fractions Treated to Date: 7
Plan Prescribed Dose Per Fraction: 1.8 Gy
Plan Total Fractions Prescribed: 25
Plan Total Prescribed Dose: 45 Gy
Reference Point Dosage Given to Date: 12.6 Gy
Reference Point Session Dosage Given: 1.8 Gy
Session Number: 7

## 2023-08-04 ENCOUNTER — Ambulatory Visit
Admission: RE | Admit: 2023-08-04 | Discharge: 2023-08-04 | Disposition: A | Discharge status: Home / Self Care | Payer: Medicaid Other | Source: Ambulatory Visit | Attending: Radiation Oncology | Admitting: Radiation Oncology

## 2023-08-04 ENCOUNTER — Other Ambulatory Visit: Payer: Self-pay

## 2023-08-04 DIAGNOSIS — Z510 Encounter for antineoplastic radiation therapy: Secondary | ICD-10-CM | POA: Diagnosis not present

## 2023-08-04 LAB — RAD ONC ARIA SESSION SUMMARY
Course Elapsed Days: 10
Plan Fractions Treated to Date: 8
Plan Prescribed Dose Per Fraction: 1.8 Gy
Plan Total Fractions Prescribed: 25
Plan Total Prescribed Dose: 45 Gy
Reference Point Dosage Given to Date: 14.4 Gy
Reference Point Session Dosage Given: 1.8 Gy
Session Number: 8

## 2023-08-05 ENCOUNTER — Ambulatory Visit: Payer: Medicaid Other

## 2023-08-05 ENCOUNTER — Telehealth: Payer: Self-pay | Admitting: Radiation Oncology

## 2023-08-05 NOTE — Telephone Encounter (Signed)
Pt LVM stating she is ill and needs to r/s today's appt for radiation tx. Message forwarded to Support RTT.

## 2023-08-08 ENCOUNTER — Ambulatory Visit: Payer: Medicaid Other

## 2023-08-08 ENCOUNTER — Other Ambulatory Visit: Payer: Self-pay

## 2023-08-08 NOTE — Progress Notes (Signed)
Specialty Pharmacy outreach attempt schedule update.

## 2023-08-09 ENCOUNTER — Ambulatory Visit: Payer: Medicaid Other

## 2023-08-10 ENCOUNTER — Ambulatory Visit: Payer: Medicaid Other | Admitting: Radiation Oncology

## 2023-08-10 ENCOUNTER — Other Ambulatory Visit: Payer: Self-pay

## 2023-08-10 ENCOUNTER — Ambulatory Visit: Payer: Medicaid Other

## 2023-08-11 ENCOUNTER — Ambulatory Visit: Payer: Medicaid Other

## 2023-08-11 ENCOUNTER — Inpatient Hospital Stay: Payer: Medicaid Other | Admitting: Hematology

## 2023-08-11 ENCOUNTER — Inpatient Hospital Stay: Payer: Medicaid Other

## 2023-08-11 NOTE — Assessment & Plan Note (Deleted)
 [id number], stage IV,with oligo lung metastasis, intact MMR, KRAS G12V (+) -We reviewed her medical record and workup in detail with the patient and her daughter.  She presented with hematochezia since 05/2022 -Workup showed a near obstructing rectal mass. Path confirmed adenocarcinoma. Baseline CEA elevated to 18.1.  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  I added bevacizumab to her chemo from cycle 3 -restaging CT 11/9 showed good PR, she completed a total of 8 cycle chemo then started concurrent chemoRT to rectal cancer on 07/25/2023

## 2023-08-12 ENCOUNTER — Ambulatory Visit: Payer: Medicaid Other

## 2023-08-15 ENCOUNTER — Ambulatory Visit
Admission: RE | Admit: 2023-08-15 | Discharge: 2023-08-15 | Disposition: A | Discharge status: Home / Self Care | Payer: Medicaid Other | Source: Ambulatory Visit | Attending: Radiation Oncology

## 2023-08-15 ENCOUNTER — Other Ambulatory Visit: Payer: Self-pay

## 2023-08-15 ENCOUNTER — Ambulatory Visit: Payer: Medicaid Other | Admitting: Pulmonary Disease

## 2023-08-15 DIAGNOSIS — Z510 Encounter for antineoplastic radiation therapy: Secondary | ICD-10-CM | POA: Diagnosis not present

## 2023-08-15 LAB — RAD ONC ARIA SESSION SUMMARY
Course Elapsed Days: 21
Plan Fractions Treated to Date: 9
Plan Prescribed Dose Per Fraction: 1.8 Gy
Plan Total Fractions Prescribed: 25
Plan Total Prescribed Dose: 45 Gy
Reference Point Dosage Given to Date: 16.2 Gy
Reference Point Session Dosage Given: 1.8 Gy
Session Number: 9

## 2023-08-16 ENCOUNTER — Ambulatory Visit
Admission: RE | Admit: 2023-08-16 | Discharge: 2023-08-16 | Disposition: A | Discharge status: Home / Self Care | Payer: Medicaid Other | Source: Ambulatory Visit | Attending: Radiation Oncology | Admitting: Radiation Oncology

## 2023-08-16 ENCOUNTER — Ambulatory Visit: Payer: Medicaid Other

## 2023-08-16 ENCOUNTER — Other Ambulatory Visit: Payer: Self-pay

## 2023-08-16 DIAGNOSIS — Z510 Encounter for antineoplastic radiation therapy: Secondary | ICD-10-CM | POA: Diagnosis not present

## 2023-08-16 LAB — RAD ONC ARIA SESSION SUMMARY
Course Elapsed Days: 22
Plan Fractions Treated to Date: 10
Plan Prescribed Dose Per Fraction: 1.8 Gy
Plan Total Fractions Prescribed: 25
Plan Total Prescribed Dose: 45 Gy
Reference Point Dosage Given to Date: 18 Gy
Reference Point Session Dosage Given: 1.8 Gy
Session Number: 10

## 2023-08-17 ENCOUNTER — Other Ambulatory Visit: Payer: Self-pay

## 2023-08-17 ENCOUNTER — Ambulatory Visit: Payer: Medicaid Other | Admitting: Radiation Oncology

## 2023-08-17 ENCOUNTER — Ambulatory Visit
Admission: RE | Admit: 2023-08-17 | Discharge: 2023-08-17 | Disposition: A | Discharge status: Home / Self Care | Payer: Medicaid Other | Source: Ambulatory Visit | Attending: Radiation Oncology | Admitting: Radiation Oncology

## 2023-08-17 ENCOUNTER — Other Ambulatory Visit (HOSPITAL_COMMUNITY): Payer: Self-pay

## 2023-08-17 DIAGNOSIS — Z510 Encounter for antineoplastic radiation therapy: Secondary | ICD-10-CM | POA: Diagnosis not present

## 2023-08-17 LAB — RAD ONC ARIA SESSION SUMMARY
Course Elapsed Days: 23
Plan Fractions Treated to Date: 11
Plan Prescribed Dose Per Fraction: 1.8 Gy
Plan Total Fractions Prescribed: 25
Plan Total Prescribed Dose: 45 Gy
Reference Point Dosage Given to Date: 19.8 Gy
Reference Point Session Dosage Given: 1.8 Gy
Session Number: 11

## 2023-08-17 NOTE — Progress Notes (Signed)
Specialty Pharmacy Refill Coordination Note  Erica [name] is a 58 y.o. female contacted today regarding refills of specialty medication(s) Capecitabine (XELODA)   Patient requested Delivery   Delivery date: 08/18/23   Verified address: [address]   [place] [address]   Medication will be filled on 08/17/23.

## 2023-08-17 NOTE — Progress Notes (Signed)
Specialty Pharmacy Ongoing Clinical Assessment Note  Erica [name] is a 58 y.o. female who is being followed by the specialty pharmacy service for RxSp Oncology   Patient's specialty medication(s) reviewed today: Capecitabine (XELODA)   Missed doses in the last 4 weeks: 2   Patient/Caregiver did not have any additional questions or concerns.   Therapeutic benefit summary: Unable to assess   Adverse events/side effects summary: No adverse events/side effects   Patient's therapy is appropriate to: Continue    Goals Addressed             This Visit's Progress    Slow Disease Progression       Patient is unable to be assessed as therapy was recently initiated. Patient will maintain adherence         Follow up:  3 months  Otto Herb Specialty Pharmacist

## 2023-08-18 ENCOUNTER — Other Ambulatory Visit: Payer: Self-pay

## 2023-08-18 ENCOUNTER — Ambulatory Visit
Admission: RE | Admit: 2023-08-18 | Discharge: 2023-08-18 | Disposition: A | Discharge status: Home / Self Care | Payer: Medicaid Other | Source: Ambulatory Visit | Attending: Radiation Oncology | Admitting: Radiation Oncology

## 2023-08-18 DIAGNOSIS — Z510 Encounter for antineoplastic radiation therapy: Secondary | ICD-10-CM | POA: Diagnosis not present

## 2023-08-18 LAB — RAD ONC ARIA SESSION SUMMARY
Course Elapsed Days: 24
Plan Fractions Treated to Date: 12
Plan Prescribed Dose Per Fraction: 1.8 Gy
Plan Total Fractions Prescribed: 25
Plan Total Prescribed Dose: 45 Gy
Reference Point Dosage Given to Date: 21.6 Gy
Reference Point Session Dosage Given: 1.8 Gy
Session Number: 12

## 2023-08-19 ENCOUNTER — Ambulatory Visit
Admission: RE | Admit: 2023-08-19 | Discharge: 2023-08-19 | Disposition: A | Discharge status: Home / Self Care | Payer: Medicaid Other | Source: Ambulatory Visit | Attending: Radiation Oncology | Admitting: Radiation Oncology

## 2023-08-19 ENCOUNTER — Other Ambulatory Visit: Payer: Self-pay

## 2023-08-19 DIAGNOSIS — Z510 Encounter for antineoplastic radiation therapy: Secondary | ICD-10-CM | POA: Diagnosis not present

## 2023-08-19 LAB — RAD ONC ARIA SESSION SUMMARY
Course Elapsed Days: 25
Plan Fractions Treated to Date: 13
Plan Prescribed Dose Per Fraction: 1.8 Gy
Plan Total Fractions Prescribed: 25
Plan Total Prescribed Dose: 45 Gy
Reference Point Dosage Given to Date: 23.4 Gy
Reference Point Session Dosage Given: 1.8 Gy
Session Number: 13

## 2023-08-22 ENCOUNTER — Ambulatory Visit: Payer: Medicaid Other | Admitting: Radiation Oncology

## 2023-08-22 ENCOUNTER — Other Ambulatory Visit: Payer: Self-pay

## 2023-08-22 ENCOUNTER — Ambulatory Visit
Admission: RE | Admit: 2023-08-22 | Discharge: 2023-08-22 | Disposition: A | Discharge status: Home / Self Care | Payer: Medicaid Other | Source: Ambulatory Visit | Attending: Radiation Oncology | Admitting: Radiation Oncology

## 2023-08-22 DIAGNOSIS — C7800 Secondary malignant neoplasm of unspecified lung: Secondary | ICD-10-CM | POA: Diagnosis not present

## 2023-08-22 DIAGNOSIS — C20 Malignant neoplasm of rectum: Secondary | ICD-10-CM | POA: Diagnosis present

## 2023-08-22 DIAGNOSIS — Z510 Encounter for antineoplastic radiation therapy: Secondary | ICD-10-CM | POA: Diagnosis present

## 2023-08-22 DIAGNOSIS — Z452 Encounter for adjustment and management of vascular access device: Secondary | ICD-10-CM | POA: Diagnosis not present

## 2023-08-22 LAB — RAD ONC ARIA SESSION SUMMARY
Course Elapsed Days: 28
Plan Fractions Treated to Date: 14
Plan Prescribed Dose Per Fraction: 1.8 Gy
Plan Total Fractions Prescribed: 25
Plan Total Prescribed Dose: 45 Gy
Reference Point Dosage Given to Date: 25.2 Gy
Reference Point Session Dosage Given: 1.8 Gy
Session Number: 14

## 2023-08-22 NOTE — Progress Notes (Signed)
 Palliative Medicine [facility]  [phone] [phone]   Name: Erica [name] Date: 08/22/2023 MRN: [id number]  DOB: 04-27-1966  Patient Care Team: System, Provider Not In as PCP - General Gollan, Evalene PARAS, MD as PCP - Cardiology (Cardiology) Donette Ellouise DELENA, FNP as Nurse Practitioner (Family Medicine) Perla Evalene PARAS, MD as Consulting Physician (Cardiology) Linard Alm NOVAK, MD (Inactive) as Consulting Physician (Pulmonary Disease) Cindie Jesusa HERO, RN as Registered Nurse Dannielle Arlean FALCON, RN (Inactive) as Registered Nurse Maurie Rayfield BIRCH, RN as Oncology Nurse Navigator Lanny Callander, MD as Consulting Physician (Oncology) Pickenpack-Cousar, Fannie SAILOR, NP as Nurse Practitioner (Hospice and Palliative Medicine) Dewey Rush, MD as Consulting Physician (Radiation Oncology) Therisa Bi, MD as Consulting Physician (Gastroenterology) Isadora Hose, MD as Consulting Physician (Pulmonary Disease)    INTERVAL HISTORY: Erica [name] is a 58 y.o. 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 Palliative ask to see for symptom management and goals of care.   SOCIAL HISTORY:     reports that she has been smoking cigarettes. She has a 138 pack-year smoking history. She has never used smokeless tobacco. She reports current alcohol use. She reports that she does not use drugs.  ADVANCE DIRECTIVES:  None on file  CODE STATUS: Full code  PAST MEDICAL HISTORY: Past Medical History:  Diagnosis Date   (HFpEF) heart failure with preserved ejection fraction (HCC)    a. 05/2015 Echo: EF 60-65%, no rwma, PASP .   Acute pancreatitis 08/13/2018   Anxiety    Arthritis    Asthma    Bell's palsy    no deficit   Bronchitis    Cancer (HCC) 12/2022   rectal cancer   CHF (congestive heart failure) (HCC)    COPD (chronic obstructive pulmonary  disease) (HCC)    Depression    Diabetes mellitus without complication (HCC)    type II 03/2017   Dyspnea    Fatty liver    per patient   Gall stones    per patient   Gastric ulcer    Hyperlipidemia    Hypertension    Lung nodule 12/2022   upper left   OSA (obstructive sleep apnea)    a. did not tolerate CPAP. On oxygen 2L via [place]   Pancreatitis    07/2018   Shoulder injury    6/19   Smoker     ALLERGIES:  is allergic to tramadol and penicillins.  MEDICATIONS:  Current Outpatient Medications  Medication Sig Dispense Refill   albuterol  (VENTOLIN  HFA) 108 (90 Base) MCG/ACT inhaler Inhale 2 puffs into the lungs every 6 (six) hours as needed for wheezing or shortness of breath.      Blood Glucose Monitoring Suppl (ACCU-CHEK AVIVA PLUS) w/Device KIT See admin instructions.  0   busPIRone  (BUSPAR ) 30 MG tablet Take 30 mg by mouth 2 (two) times daily. PER PT STATES HER DOSE WAS INCREASED     capecitabine  (XELODA ) 500 MG tablet Take 3 tablets (1,500 mg total) by mouth 2 (two) times daily after a meal. Take on days of radiation only, Monday through Friday 90 tablet 1   clonazePAM  (KLONOPIN ) 0.5 MG tablet Take 0.5 mg by mouth at bedtime as needed for anxiety.     diltiazem  (CARDIZEM  CD) 120 MG 24 hr capsule TAKE 1 CAPSULE BY MOUTH EVERY DAY 180 capsule 2   diltiazem  (CARDIZEM ) 30 MG tablet TAKE 1 TABLET(30  MG) BY MOUTH THREE TIMES DAILY AS NEEDED 90 tablet 3   doxycycline  (VIBRA -TABS) 100 MG tablet Take 1 tablet (100 mg total) by mouth 2 (two) times daily. 20 tablet 0   DULoxetine  (CYMBALTA ) 60 MG capsule Take 60 mg by mouth 2 (two) times daily.      ezetimibe  (ZETIA ) 10 MG tablet Take 10 mg by mouth at bedtime.     fluconazole  (DIFLUCAN ) 150 MG tablet Take 1 tablet po one time for yeast infection/thrush. May repeat dose in 3 days for persistent symptoms. 3 tablet 0   Fluticasone -Salmeterol (ADVAIR) 250-50 MCG/DOSE AEPB Inhale 1 puff into the lungs 2 (two) times daily.     furosemide   (LASIX ) 40 MG tablet TAKE 1 TABLET BY MOUTH EVERY DAY. MAY TAKE ADDITIONAL TABLET IN THE AM FOR WEIGHT GAIN AS NEEDED 110 tablet 2   JARDIANCE 25 MG TABS tablet Take 25 mg by mouth daily.     lidocaine -prilocaine  (EMLA ) cream Apply to affected area once 30 g 3   magic mouthwash (nystatin , lidocaine , diphenhydrAMINE, alum & mag hydroxide) suspension Take 5 mLs by mouth 4 (four) times daily as needed for mouth [name]. 140 mL 1   metFORMIN  (GLUCOPHAGE -XR) 500 MG 24 hr tablet Take 1,000 mg by mouth 2 (two) times daily.     ondansetron  (ZOFRAN ) 8 MG tablet Take 1 tablet (8 mg total) by mouth every 8 (eight) hours as needed for nausea, vomiting or refractory nausea / vomiting. Start on the third day after chemotherapy. 30 tablet 1   Oxycodone  HCl 10 MG TABS Take 1 tablet (10 mg total) by mouth 3 (three) times daily as needed. 90 tablet 0   OXYGEN Inhale 2 L into the lungs at bedtime. Or as needed throughout the day if short of breath     pantoprazole  (PROTONIX ) 40 MG tablet Take 1 tablet (40 mg total) by mouth daily. 30 tablet 0   potassium chloride  SA (KLOR-CON ) 20 MEQ tablet Take 20 mEq by mouth 2 (two) times daily.     pregabalin  (LYRICA ) 150 MG capsule Take 1 capsule (150 mg total) by mouth 3 (three) times daily. 90 capsule 0   prochlorperazine  (COMPAZINE ) 10 MG tablet Take 1 tablet (10 mg total) by mouth every 6 (six) hours as needed for nausea or vomiting. 30 tablet 1   spironolactone  (ALDACTONE ) 25 MG tablet TAKE 1 TABLET(25 MG) BY MOUTH EVERY MORNING 90 tablet 3   sucralfate  (CARAFATE ) 1 G tablet Take 1 tablet (1 g total) by mouth 4 (four) times daily -  with meals and at bedtime. (Patient taking differently: Take 1 g by mouth 2 (two) times daily.) 120 tablet 0   tiotropium (SPIRIVA ) 18 MCG inhalation capsule Place 18 mcg into inhaler and inhale daily.     tiZANidine  (ZANAFLEX ) 4 MG tablet Take 8 mg by mouth 3 (three) times daily.     Varenicline  Tartrate, Starter, 0.5 MG X 11 & 1 MG X 42 TBPK See  admin instructions. follow package directions     zolpidem (AMBIEN) 10 MG tablet Take 10 mg by mouth at bedtime as needed for sleep.     No current facility-administered medications for this visit.    VITAL SIGNS: LMP 03/13/2015 (Approximate) Comment: more than 2 years ago There were no vitals filed for this visit.  Estimated body mass index is 25.65 kg/m (pended) as calculated from the following:   Height as of 07/06/23: (P) 5' 8 (1.727 m).   Weight as of 07/28/23: 168 lb  11.2 oz (76.5 kg).   PERFORMANCE STATUS (ECOG) : 1 - Symptomatic but completely ambulatory   Physical Exam General: NAD Cardiovascular: regular rate and rhythm Pulmonary: clear ant fields Abdomen: soft, nontender, + bowel sounds Extremities: no edema, no joint deformities Skin: no rashes, chemo induced alopecia  Neurological: AAO x3  Discussed the use of AI scribe software for clinical note transcription with the patient, who gave verbal consent to proceed.  IMPRESSION: Erica [name] is a 58 year old female who presents for palliative care follow-up. Doing well overall. Denies nausea, vomiting, and diarrhea. She is taking a delay stool softeners to manage constipation, which she describes as effective.  She has experienced significant hair loss due to previous treatment, leading her to shave her head. She describes the hair loss as occurring in patches, prompting her to shave it all off for uniform regrowth.  Erica [name] is currently undergoing radiation therapy and has been experiencing nausea since starting the treatment, which she manages with medication. She is looking forward to final treatment on 2/21 which will then be followed by 5-6 SBRT treatments to her lungs. She is awaiting a CT scan to assess the progress of her treatment in the upcoming weeks. Overall feels that she is tolerating treatment without difficulty.   We discussed her [name] management. Erica [name] is well controlled. She is much  appreciative of ongoing improvement. She is taking oxycodone  IR as needed, Lyrica , and tizanidine  as needed.    She experiences daily muscle cramps in her feet, legs, sides, and back, occurring intermittently throughout the day. She has had difficulty obtaining her muscle relaxer prescription due to a pharmacy mix-up, but it should be resolved soon.  Goals of Care Socially, she discusses her relationship with her daughters, one of whom is not speaking to her due to financial disagreements, causing concern about missing time with her grandchildren. Her other daughter has recently returned from [place] , and her 42 year old granddaughter is interested in joining the [employer] which she speaks of how proud she is of her decisions.   Keana is realistic in her understanding expressing she knows that each day is a gift. She is appreciative of her daughter being involved in her life as they missed more than 7 years of life and togetherness due to being in separate states. The patient states she will continue to make new memories and enjoy every moment with her family. Continues to remain hopeful for stability relying on her faith and dedication to treatment to allow her every opportunity to continue thriving with her quality of life as a focus.   03/31/23- We discussed her current illness and what it means in the larger context of her on-going co-morbidities. Natural disease trajectory and expectations were discussed.   Ms. Schwoerer is realistic in her understanding.  Verbalize goal is to continue to treat the treatable allow her every opportunity to continue to thrive .  Is taking things oneday at a time.  We discussed Her current illness and what it means in the larger context of Her on-going co-morbidities. Natural disease trajectory and expectations were discussed.  I discussed the importance of continued conversation with family and their medical providers regarding overall plan of care and treatment options,  ensuring decisions are within the context of the patients values and GOCs.  Assessment and Plan Rectal/Lung Cancer  Patient undergoing radiation therapy and tolerating well. Final treatment coming up on 2/21 followed by 5 sessions of SBRT to lungs.  Hair loss  noted due to therapy. Awaiting CT scan to assess response to treatment. -Continue current treatment and supportive care. -CT scan to be performed to assess response to treatment in upcoming weeks per oncology.   [name] Management Patient reports [name] as controlled on current regimen. Denies worsening symptoms. Requesting refill.  - Oxycodone  10mg  as needed -Lyrica  150mg  twice daily -Tizanidine  every 8 hours as needed.   Muscle Cramps Patient reports frequent cramping in feet, legs, and sides. -Ensure muscle relaxant prescription is filled and taken as directed. -Tizanidine  every 8 hours as needed.  Constipation Patient reports normal bowel movements and is taking an unspecified pill (possibly a laxative). -Continue current management with the daily Senna.   Follow-up plans -See patient back in clinic on 20th of the month.  Patient expressed understanding and was in agreement with this plan. She also understands that She can call the clinic at any time with any questions, concerns, or complaints.   Any controlled substances utilized were prescribed in the context of palliative care. PDMP has been reviewed.   Visit consisted of counseling and education dealing with the complex and emotionally intense issues of symptom management and palliative care in the setting of serious and potentially life-threatening illness.  Levon Borer, AGPCNP-BC  Palliative Medicine Team/[facility]

## 2023-08-23 ENCOUNTER — Other Ambulatory Visit: Payer: Self-pay

## 2023-08-23 ENCOUNTER — Ambulatory Visit
Admission: RE | Admit: 2023-08-23 | Discharge: 2023-08-23 | Disposition: A | Discharge status: Home / Self Care | Payer: Medicaid Other | Source: Ambulatory Visit | Attending: Radiation Oncology | Admitting: Radiation Oncology

## 2023-08-23 DIAGNOSIS — F17210 Nicotine dependence, cigarettes, uncomplicated: Secondary | ICD-10-CM | POA: Insufficient documentation

## 2023-08-23 DIAGNOSIS — C7800 Secondary malignant neoplasm of unspecified lung: Secondary | ICD-10-CM | POA: Insufficient documentation

## 2023-08-23 DIAGNOSIS — Z452 Encounter for adjustment and management of vascular access device: Secondary | ICD-10-CM | POA: Insufficient documentation

## 2023-08-23 DIAGNOSIS — J4489 Other specified chronic obstructive pulmonary disease: Secondary | ICD-10-CM | POA: Insufficient documentation

## 2023-08-23 DIAGNOSIS — Z515 Encounter for palliative care: Secondary | ICD-10-CM | POA: Insufficient documentation

## 2023-08-23 DIAGNOSIS — K5900 Constipation, unspecified: Secondary | ICD-10-CM | POA: Insufficient documentation

## 2023-08-23 DIAGNOSIS — Z510 Encounter for antineoplastic radiation therapy: Secondary | ICD-10-CM | POA: Diagnosis not present

## 2023-08-23 DIAGNOSIS — C20 Malignant neoplasm of rectum: Secondary | ICD-10-CM | POA: Insufficient documentation

## 2023-08-23 DIAGNOSIS — J329 Chronic sinusitis, unspecified: Secondary | ICD-10-CM | POA: Insufficient documentation

## 2023-08-23 LAB — RAD ONC ARIA SESSION SUMMARY
Course Elapsed Days: 29
Plan Fractions Treated to Date: 15
Plan Prescribed Dose Per Fraction: 1.8 Gy
Plan Total Fractions Prescribed: 25
Plan Total Prescribed Dose: 45 Gy
Reference Point Dosage Given to Date: 27 Gy
Reference Point Session Dosage Given: 1.8 Gy
Session Number: 15

## 2023-08-24 ENCOUNTER — Other Ambulatory Visit: Payer: Self-pay

## 2023-08-24 ENCOUNTER — Ambulatory Visit
Admission: RE | Admit: 2023-08-24 | Discharge: 2023-08-24 | Disposition: A | Discharge status: Home / Self Care | Payer: Medicaid Other | Source: Ambulatory Visit | Attending: Radiation Oncology | Admitting: Radiation Oncology

## 2023-08-24 ENCOUNTER — Ambulatory Visit: Payer: Medicaid Other | Admitting: Radiation Oncology

## 2023-08-24 DIAGNOSIS — Z510 Encounter for antineoplastic radiation therapy: Secondary | ICD-10-CM | POA: Diagnosis not present

## 2023-08-24 LAB — RAD ONC ARIA SESSION SUMMARY
Course Elapsed Days: 30
Plan Fractions Treated to Date: 16
Plan Prescribed Dose Per Fraction: 1.8 Gy
Plan Total Fractions Prescribed: 25
Plan Total Prescribed Dose: 45 Gy
Reference Point Dosage Given to Date: 28.8 Gy
Reference Point Session Dosage Given: 1.8 Gy
Session Number: 16

## 2023-08-25 ENCOUNTER — Inpatient Hospital Stay (HOSPITAL_BASED_OUTPATIENT_CLINIC_OR_DEPARTMENT_OTHER): Payer: Medicaid Other | Admitting: Nurse Practitioner

## 2023-08-25 ENCOUNTER — Other Ambulatory Visit: Payer: Self-pay

## 2023-08-25 ENCOUNTER — Encounter: Payer: Self-pay | Admitting: Nurse Practitioner

## 2023-08-25 ENCOUNTER — Inpatient Hospital Stay: Payer: Medicaid Other | Admitting: Hematology

## 2023-08-25 ENCOUNTER — Ambulatory Visit
Admission: RE | Admit: 2023-08-25 | Discharge: 2023-08-25 | Disposition: A | Discharge status: Home / Self Care | Payer: Medicaid Other | Source: Ambulatory Visit | Attending: Radiation Oncology | Admitting: Radiation Oncology

## 2023-08-25 ENCOUNTER — Inpatient Hospital Stay: Payer: Medicaid Other | Attending: Oncology

## 2023-08-25 ENCOUNTER — Encounter: Payer: Self-pay | Admitting: Hematology

## 2023-08-25 VITALS — BP 141/78 | HR 94 | Temp 97.2°F | Resp 16 | Wt 161.3 lb

## 2023-08-25 DIAGNOSIS — Z95828 Presence of other vascular implants and grafts: Secondary | ICD-10-CM

## 2023-08-25 DIAGNOSIS — Z7189 Other specified counseling: Secondary | ICD-10-CM

## 2023-08-25 DIAGNOSIS — C20 Malignant neoplasm of rectum: Secondary | ICD-10-CM

## 2023-08-25 DIAGNOSIS — Z510 Encounter for antineoplastic radiation therapy: Secondary | ICD-10-CM | POA: Diagnosis not present

## 2023-08-25 DIAGNOSIS — Z515 Encounter for palliative care: Secondary | ICD-10-CM

## 2023-08-25 DIAGNOSIS — K5903 Drug induced constipation: Secondary | ICD-10-CM

## 2023-08-25 DIAGNOSIS — G893 Neoplasm related pain (acute) (chronic): Secondary | ICD-10-CM

## 2023-08-25 DIAGNOSIS — M792 Neuralgia and neuritis, unspecified: Secondary | ICD-10-CM

## 2023-08-25 LAB — RAD ONC ARIA SESSION SUMMARY
Course Elapsed Days: 31
Plan Fractions Treated to Date: 17
Plan Prescribed Dose Per Fraction: 1.8 Gy
Plan Total Fractions Prescribed: 25
Plan Total Prescribed Dose: 45 Gy
Reference Point Dosage Given to Date: 30.6 Gy
Reference Point Session Dosage Given: 1.8 Gy
Session Number: 17

## 2023-08-25 LAB — COMPREHENSIVE METABOLIC PANEL
ALT: 13 U/L (ref 0–44)
AST: 16 U/L (ref 15–41)
Albumin: 3.7 g/dL (ref 3.5–5.0)
Alkaline Phosphatase: 78 U/L (ref 38–126)
Anion gap: 4 — ABNORMAL LOW (ref 5–15)
BUN: 10 mg/dL (ref 6–20)
CO2: 29 mmol/L (ref 22–32)
Calcium: 9.2 mg/dL (ref 8.9–10.3)
Chloride: 103 mmol/L (ref 98–111)
Creatinine, Ser: 0.72 mg/dL (ref 0.44–1.00)
GFR, Estimated: >60 mL/min (ref >60)
Glucose, Bld: 189 mg/dL — ABNORMAL HIGH (ref 70–99)
Potassium: 4.2 mmol/L (ref 3.5–5.1)
Sodium: 136 mmol/L (ref 135–145)
Total Bilirubin: 0.4 mg/dL (ref 0.0–1.2)
Total Protein: 6.7 g/dL (ref 6.5–8.1)

## 2023-08-25 LAB — CBC WITH DIFFERENTIAL/PLATELET
Abs Immature Granulocytes: 0.01 10*3/uL (ref 0.00–0.07)
Basophils Absolute: 0.1 10*3/uL (ref 0.0–0.1)
Basophils Relative: 1 %
Eosinophils Absolute: 0.3 10*3/uL (ref 0.0–0.5)
Eosinophils Relative: 6 %
HCT: 41.6 % (ref 36.0–46.0)
Hemoglobin: 13.9 g/dL (ref 12.0–15.0)
Immature Granulocytes: 0 %
Lymphocytes Relative: 14 %
Lymphs Abs: 0.8 10*3/uL (ref 0.7–4.0)
MCH: 33.1 pg (ref 26.0–34.0)
MCHC: 33.4 g/dL (ref 30.0–36.0)
MCV: 99 fL (ref 80.0–100.0)
Monocytes Absolute: 0.5 10*3/uL (ref 0.1–1.0)
Monocytes Relative: 8 %
Neutro Abs: 4.2 10*3/uL (ref 1.7–7.7)
Neutrophils Relative %: 71 %
Platelets: 165 10*3/uL (ref 150–400)
RBC: 4.2 MIL/uL (ref 3.87–5.11)
RDW: 21.1 % — ABNORMAL HIGH (ref 11.5–15.5)
WBC: 5.9 10*3/uL (ref 4.0–10.5)
nRBC: 0 % (ref 0.0–0.2)

## 2023-08-25 LAB — CEA (ACCESS): CEA (CHCC): 9.15 ng/mL — ABNORMAL HIGH (ref 0.00–5.00)

## 2023-08-25 MED ORDER — SODIUM CHLORIDE 0.9% FLUSH
10.0000 mL | Freq: Once | INTRAVENOUS | Status: AC
  Administered 2023-08-25: 10 mL

## 2023-08-25 MED ORDER — TIZANIDINE HCL 4 MG PO TABS: 8.0000 mg | ORAL_TABLET | Freq: Three times a day (TID) | ORAL | 2 refills | Status: DC | PRN

## 2023-08-25 MED ORDER — PREGABALIN 150 MG PO CAPS: 150.0000 mg | ORAL_CAPSULE | Freq: Three times a day (TID) | ORAL | 0 refills | Status: DC

## 2023-08-25 MED ORDER — OXYCODONE HCL 10 MG PO TABS: 10.0000 mg | ORAL_TABLET | Freq: Three times a day (TID) | ORAL | 0 refills | Status: DC | PRN

## 2023-08-25 MED ORDER — HEPARIN SOD (PORK) LOCK FLUSH 100 UNIT/ML IV SOLN
500.0000 [IU] | Freq: Once | INTRAVENOUS | Status: AC
  Administered 2023-08-25: 500 [IU]

## 2023-08-25 NOTE — Progress Notes (Signed)
 [facility]   [phone] [phone]   Clinic Follow up Note   Patient Care Team: System, Provider Not In as PCP - General Gollan, Evalene PARAS, MD as PCP - Cardiology (Cardiology) Donette Ellouise LABOR, FNP as Nurse Practitioner (Family Medicine) Perla, Evalene PARAS, MD as Consulting Physician (Cardiology) Linard Alm NOVAK, MD (Inactive) as Consulting Physician (Pulmonary Disease) Cindie Jesusa HERO, RN as Registered Nurse Dannielle Arlean FALCON, RN (Inactive) as Registered Nurse Maurie Rayfield BIRCH, RN as Oncology Nurse Navigator Lanny Callander, MD as Consulting Physician (Oncology) Pickenpack-Cousar, Fannie SAILOR, NP as Nurse Practitioner (Hospice and Palliative Medicine) Dewey Rush, MD as Consulting Physician (Radiation Oncology) Therisa Bi, MD as Consulting Physician (Gastroenterology) Isadora Hose, MD as Consulting Physician (Pulmonary Disease)  Date of Service:  08/25/2023  CHIEF COMPLAINT: f/u of rectal cancer   CURRENT THERAPY:  Concurrent chemoRT with Xeloda    Oncology History   Rectal cancer (HCC) cT3cN2M1, stage IV,with oligo lung metastasis, intact MMR, KRAS G12V (+) -We reviewed her medical record and workup in detail with the patient and her daughter.  She presented with hematochezia since 05/2022 -Workup showed a near obstructing rectal mass. Path confirmed adenocarcinoma. Baseline CEA elevated to 18.1.  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  I added bevacizumab  to her chemo from cycle 3 -restaging CT 11/9 showed good PR, she completed a total of 8 cycle chemo then started concurrent chemoRT to rectal cancer on 07/25/2023    Assessment and Plan     Rectal Cancer Undergoing treatment with Xeloda  (capecitabine ) and daily radiation therapy. Reports nausea and vomiting last week, managed with anti-nausea medication. No fever or chills. Blood counts normal, tumor markers trending down. Liver function tests pending. Lung radiation scheduled post-rectal cancer radiation, with a CT scan planned prior. Last rectal cancer radiation session on September 09, 2023. Lung radiation will be every other day without concurrent chemotherapy. - Continue Xeloda  (capecitabine ) as prescribed - Continue daily radiation therapy for rectal cancer - Administer anti-nausea medication as needed - Monitor liver function tests - Schedule CT scan before starting lung radiation - Plan for lung radiation treatments every other day from late February to early March - Follow-up appointment on September 08, 2023  Pain Management Taking oxycodone  and Lyrica  for pain management. Reports pain when sitting for prolonged periods or straining, no significant skin reactions from radiation. - Continue oxycodone  and Lyrica  as prescribed - Monitor for skin reactions - Evaluate pain management needs during follow-up  Plan -lab reviewed  -continue chemoRT -f/u in 2 weeks         SUMMARY OF ONCOLOGIC HISTORY: Oncology History Overview Note   Cancer Staging  Rectal cancer [facility]) Staging form: Colon and Rectum, AJCC 8th Edition - Clinical stage from 12/23/2022: Stage IVA (cT3, cN2, pM1a) - Signed by Babara Call, MD on 02/04/2023 Stage prefix: Initial diagnosis     Rectal cancer (HCC)  12/15/2022 Pathology Results   Colonoscopy:   Positive for invasive adenocarcinoma, G1 well differentiated      12/15/2022 Procedure   Colonoscopy under the care of Dr. Bi   Findings: -The perianal and digital rectal examinations were normal. -Extensive amounts of liquid semi- liquid semi- solid stool was found in the entire  colon -A partially obstructing large mass was found in the  rectum. The mass was circumferential.   12/23/2022 Initial Diagnosis   Rectal cancer [facility]) Patient was seen by gastroenterology due to hematochezia as well as unintentional weight loss.  12/15/2022, colonoscopy showed fungating partially obstructing large mass in the rectum, circumferential, mass extended from about 15 cm from anus to about 8 cm from anal verge.  Biopsy was taken. Poor preparation of colon.  Colonoscopy was repeated on 12/16/2022, preparations inadequate.  Pathology showed invasive adenocarcinoma.   12/23/2022 Cancer Staging   Staging form: Colon and Rectum, AJCC 8th Edition - Clinical stage from 12/23/2022: Stage Unknown (cTX, cNX) - Signed by Babara Call, MD on 12/23/2022 Stage prefix: Initial diagnosis  Staging form: Colon and Rectum, AJCC 8th Edition - Clinical stage from 12/23/2022: Stage IVA (cT3, cN2, pM1a) - Signed by Babara Call, MD on 02/04/2023 Stage prefix: Initial diagnosis   12/23/2022 Tumor Marker   Patient's tumor was tested for the following markers: CEA. Results of the tumor marker test revealed elevated at 18.1.   01/03/2023 Imaging   CT chest abdomen with contrast showed 2.2 cm left upper lobe nodule, suspicious for metastasis. No findings suspicious for metastatic disease in the abdomen. Bilateral adrenal adenomas, benign. Cholelithiasis, without associated inflammatory changes. Aortic Atherosclerosis (ICD10-I70.0) and Emphysema   01/08/2023 Imaging   MRI pelvis without contrast-rectal protocol showed 9.2 cm concentric mid/lower rectal mass, corresponding to the patient's known primary rectal adenocarcinoma, as above. Rectal adenocarcinoma T stage: T3c Rectal adenocarcinoma N stage:  N2 Distance from tumor to the internal anal sphincter is 3.4 cm.   01/27/2023 Procedure   Patient underwent biopsy via bronchoscopy by pulmonology. Biopsy pathology showed adenocarcinoma, Immunohistochemistry is positive with cytokeratin 20 and CDX2 and negative with cytokeratin  7, TTF-1 and napsin A consistent with colonic adenocarcinoma    01/27/2023 Pathology Results   CASE: [id number]   FINAL MICROSCOPIC DIAGNOSIS:  A. LUNG, LUL, FINE NEEDLE ASPIRATION:  Adenocarcinoma  See comment   ADDENDUM:  Immunohistochemistry is positive with cytokeratin 20 and CDX2  and negative with cytokeratin 7, TTF-1 and napsin A consistent with  colonic adenocarcinoma.    02/28/2023 Miscellaneous      03/03/2023 -  Chemotherapy   Patient is on Treatment Plan : COLORECTAL FOLFIRI q14d     05/13/2023 Imaging   CT chest, abdomen, and pelvis with contrast IMPRESSION: 1. Response to therapy of rectal primary, pelvic nodal, and isolated pulmonary metastasis. 2. Moderate amount of stool upstream from the rectal primary. Partial obstruction cannot be excluded. 3. No new or progressive disease. 4. Right Port-A-Cath with anterior SVC pericatheter small volume thrombus. 5. Age advanced coronary artery atherosclerosis. Recommend assessment of coronary risk factors. 6. Incidental findings, including: Cholelithiasis. Aortic Atherosclerosis (ICD10-I70.0).      Discussed the use of AI scribe software for clinical note transcription with the patient, who gave verbal consent to proceed.  History of Present Illness   The patient, with a history of cancer, is currently undergoing chemotherapy with Xeloda  and radiation therapy. She reports experiencing nausea and vomiting, which has been particularly severe in the past week. However, she notes that this week has been better, with no episodes of vomiting. She has been managing these symptoms with prescribed antiemetics.  In addition to the chemotherapy, the patient is also taking oxycodone  and Lyrica  daily. She reports that she is not currently taking doxycycline  and fluconazole , which were prescribed during a previous episode of bronchitis.  All other systems were reviewed with the patient and are negative.  MEDICAL HISTORY:   Past Medical History:  Diagnosis Date   (HFpEF) heart failure with preserved ejection fraction (HCC)    a. 05/2015 Echo: EF 60-65%, no rwma, PASP .   Acute pancreatitis 08/13/2018   Anxiety    Arthritis    Asthma    Bell's palsy    no deficit   Bronchitis    Cancer (HCC) 12/2022   rectal cancer   CHF (congestive heart failure) (HCC)    COPD (chronic obstructive pulmonary disease) (HCC)    Depression    Diabetes mellitus without complication (HCC)    type II 03/2017   Dyspnea    Fatty liver    per patient   Gall stones    per patient   Gastric ulcer    Hyperlipidemia    Hypertension    Lung nodule 12/2022   upper left   OSA (obstructive sleep apnea)    a. did not tolerate CPAP. On oxygen 2L via [place]   Pancreatitis    07/2018   Shoulder injury    6/19   Smoker     SURGICAL HISTORY: Past Surgical History:  Procedure Laterality Date   BRONCHIAL NEEDLE ASPIRATION BIOPSY  01/27/2023   Procedure: BRONCHIAL NEEDLE ASPIRATION BIOPSIES;  Surgeon: Isadora Hose, MD;  Location: [facility] ENDOSCOPY;  Service: Pulmonary;;   COLONOSCOPY WITH PROPOFOL  N/A 12/15/2022   Procedure: COLONOSCOPY WITH PROPOFOL ;  Surgeon: Therisa Bi, MD;  Location: [facility] ENDOSCOPY;  Service: Gastroenterology;  Laterality: N/A;   COLONOSCOPY WITH PROPOFOL  N/A 12/16/2022   Procedure: COLONOSCOPY WITH PROPOFOL ;  Surgeon: Therisa Bi, MD;  Location: [facility] ENDOSCOPY;  Service: Gastroenterology;  Laterality: N/A;   FLEXIBLE BRONCHOSCOPY N/A 06/20/2015   Procedure: FLEXIBLE BRONCHOSCOPY;  Surgeon: Laurance Flor, MD;  Location: [facility] ORS;  Service: Pulmonary;  Laterality: N/A;   IR IMAGING GUIDED PORT INSERTION  02/25/2023   KNEE SURGERY Left    8 knee surgeries    I have reviewed the social history and family history with the patient and they are unchanged from previous note.  ALLERGIES:  is allergic to tramadol and penicillins.  MEDICATIONS:  Current Outpatient Medications  Medication Sig Dispense Refill    albuterol  (VENTOLIN  HFA) 108 (90 Base) MCG/ACT inhaler Inhale 2 puffs into the lungs every 6 (six) hours as needed for wheezing or shortness of breath.      Blood Glucose Monitoring Suppl (ACCU-CHEK AVIVA PLUS) w/Device KIT See admin instructions.  0   busPIRone  (BUSPAR ) 30 MG tablet Take 30 mg by mouth 2 (two) times daily. PER PT STATES HER DOSE WAS INCREASED     capecitabine  (XELODA ) 500 MG tablet Take 3 tablets (1,500 mg total) by mouth 2 (two) times daily after a meal. Take on days of radiation only, Monday through Friday 90 tablet 1   clonazePAM  (KLONOPIN ) 0.5 MG tablet Take 0.5 mg by mouth at bedtime as needed for anxiety.     diltiazem  (CARDIZEM  CD) 120 MG 24 hr capsule TAKE 1 CAPSULE BY MOUTH EVERY DAY 180 capsule 2   diltiazem  (CARDIZEM ) 30 MG tablet TAKE 1 TABLET(30 MG) BY MOUTH THREE TIMES DAILY AS NEEDED 90 tablet 3   DULoxetine  (CYMBALTA ) 60 MG capsule Take 60 mg by mouth 2 (two) times daily.      ezetimibe  (ZETIA ) 10 MG tablet Take 10 mg by mouth at bedtime.     Fluticasone -Salmeterol (ADVAIR) 250-50 MCG/DOSE AEPB Inhale 1 puff into the lungs 2 (two)  times daily.     furosemide  (LASIX ) 40 MG tablet TAKE 1 TABLET BY MOUTH EVERY DAY. MAY TAKE ADDITIONAL TABLET IN THE AM FOR WEIGHT GAIN AS NEEDED 110 tablet 2   JARDIANCE 25 MG TABS tablet Take 25 mg by mouth daily.     lidocaine -prilocaine  (EMLA ) cream Apply to affected area once 30 g 3   magic mouthwash (nystatin , lidocaine , diphenhydrAMINE, alum & mag hydroxide) suspension Take 5 mLs by mouth 4 (four) times daily as needed for mouth pain. 140 mL 1   metFORMIN  (GLUCOPHAGE -XR) 500 MG 24 hr tablet Take 1,000 mg by mouth 2 (two) times daily.     ondansetron  (ZOFRAN ) 8 MG tablet Take 1 tablet (8 mg total) by mouth every 8 (eight) hours as needed for nausea, vomiting or refractory nausea / vomiting. Start on the third day after chemotherapy. 30 tablet 1   Oxycodone  HCl 10 MG TABS Take 1 tablet (10 mg total) by mouth 3 (three) times daily as  needed. 90 tablet 0   OXYGEN Inhale 2 L into the lungs at bedtime. Or as needed throughout the day if short of breath     pantoprazole  (PROTONIX ) 40 MG tablet Take 1 tablet (40 mg total) by mouth daily. 30 tablet 0   potassium chloride  SA (KLOR-CON ) 20 MEQ tablet Take 20 mEq by mouth 2 (two) times daily.     pregabalin  (LYRICA ) 150 MG capsule Take 1 capsule (150 mg total) by mouth 3 (three) times daily. 90 capsule 0   prochlorperazine  (COMPAZINE ) 10 MG tablet Take 1 tablet (10 mg total) by mouth every 6 (six) hours as needed for nausea or vomiting. 30 tablet 1   spironolactone  (ALDACTONE ) 25 MG tablet TAKE 1 TABLET(25 MG) BY MOUTH EVERY MORNING 90 tablet 3   sucralfate  (CARAFATE ) 1 G tablet Take 1 tablet (1 g total) by mouth 4 (four) times daily -  with meals and at bedtime. (Patient taking differently: Take 1 g by mouth 2 (two) times daily.) 120 tablet 0   tiotropium (SPIRIVA ) 18 MCG inhalation capsule Place 18 mcg into inhaler and inhale daily.     tiZANidine  (ZANAFLEX ) 4 MG tablet Take 8 mg by mouth 3 (three) times daily.     Varenicline  Tartrate, Starter, 0.5 MG X 11 & 1 MG X 42 TBPK See admin instructions. follow package directions     zolpidem (AMBIEN) 10 MG tablet Take 10 mg by mouth at bedtime as needed for sleep.     No current facility-administered medications for this visit.    PHYSICAL EXAMINATION: ECOG PERFORMANCE STATUS: 1 - Symptomatic but completely ambulatory  Vitals:   08/25/23 1355  BP: (!) 141/78  Pulse: 94  Resp: 16  Temp: (!) 97.2 F (36.2 C)  SpO2: 97%   Wt Readings from Last 3 Encounters:  08/25/23 73.2 kg  07/28/23 76.5 kg  07/06/23 76.3 kg     GENERAL:alert, no distress and comfortable SKIN: skin color, texture, turgor are normal, no rashes or significant lesions EYES: normal, Conjunctiva are pink and non-injected, sclera clear NECK: supple, thyroid normal size, non-tender, without nodularity LYMPH:  no palpable lymphadenopathy in the cervical, axillary   LUNGS: clear to auscultation and percussion with normal breathing effort HEART: regular rate & rhythm and no murmurs and no lower extremity edema ABDOMEN:abdomen soft, non-tender and normal bowel sounds Musculoskeletal:no cyanosis of digits and no clubbing  NEURO: alert & oriented x 3 with fluent speech, no focal motor/sensory deficits   LABORATORY DATA:  I have  reviewed the data as listed    Latest Ref Rng & Units 08/25/2023    1:15 PM 07/28/2023   12:40 PM 07/04/2023    8:37 AM  CBC  WBC 4.0 - 10.5 K/uL 5.9  9.0  8.4   Hemoglobin 12.0 - 15.0 g/dL 86.0  86.6  86.6   Hematocrit 36.0 - 46.0 % 41.6  40.2  41.5   Platelets 150 - 400 K/uL 165  156  190         Latest Ref Rng & Units 08/25/2023    1:15 PM 07/28/2023   12:40 PM 07/04/2023    8:37 AM  CMP  Glucose 70 - 99 mg/dL 810  821  767   BUN 6 - 20 mg/dL 10  9  8    Creatinine 0.44 - 1.00 mg/dL 9.27  9.31  9.25   Sodium 135 - 145 mmol/L 136  136  137   Potassium 3.5 - 5.1 mmol/L 4.2  4.2  4.2   Chloride 98 - 111 mmol/L 103  102  104   CO2 22 - 32 mmol/L 29  28  25    Calcium  8.9 - 10.3 mg/dL 9.2  9.2  9.2   Total Protein 6.5 - 8.1 g/dL 6.7  6.6  6.3   Total Bilirubin 0.0 - 1.2 mg/dL 0.4  0.5  0.4   Alkaline Phos 38 - 126 U/L 78  88  84   AST 15 - 41 U/L 16  22  13    ALT 0 - 44 U/L 13  23  13        RADIOGRAPHIC STUDIES: I have personally reviewed the radiological images as listed and agreed with the findings in the report. No results found.    No orders of the defined types were placed in this encounter.  All questions were answered. The patient knows to call the clinic with any problems, questions or concerns. No barriers to learning was detected. The total time spent in the appointment was 15 minutes.     Onita Mattock, MD 08/25/2023

## 2023-08-25 NOTE — Assessment & Plan Note (Signed)
 [id number], stage IV,with oligo lung metastasis, intact MMR, KRAS G12V (+) -We reviewed her medical record and workup in detail with the patient and her daughter.  She presented with hematochezia since 05/2022 -Workup showed a near obstructing rectal mass. Path confirmed adenocarcinoma. Baseline CEA elevated to 18.1.  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  I added bevacizumab to her chemo from cycle 3 -restaging CT 11/9 showed good PR, she completed a total of 8 cycle chemo then started concurrent chemoRT to rectal cancer on 07/25/2023

## 2023-08-26 ENCOUNTER — Ambulatory Visit: Payer: Medicaid Other | Admitting: Radiation Oncology

## 2023-08-26 ENCOUNTER — Other Ambulatory Visit: Payer: Self-pay

## 2023-08-26 ENCOUNTER — Ambulatory Visit
Admission: RE | Admit: 2023-08-26 | Discharge: 2023-08-26 | Disposition: A | Discharge status: Home / Self Care | Payer: Medicaid Other | Source: Ambulatory Visit | Attending: Radiation Oncology

## 2023-08-26 ENCOUNTER — Ambulatory Visit
Admission: RE | Admit: 2023-08-26 | Discharge: 2023-08-26 | Disposition: A | Discharge status: Home / Self Care | Payer: Medicaid Other | Source: Ambulatory Visit | Attending: Radiation Oncology | Admitting: Radiation Oncology

## 2023-08-26 DIAGNOSIS — Z510 Encounter for antineoplastic radiation therapy: Secondary | ICD-10-CM | POA: Diagnosis not present

## 2023-08-26 LAB — RAD ONC ARIA SESSION SUMMARY
Course Elapsed Days: 32
Plan Fractions Treated to Date: 18
Plan Prescribed Dose Per Fraction: 1.8 Gy
Plan Total Fractions Prescribed: 25
Plan Total Prescribed Dose: 45 Gy
Reference Point Dosage Given to Date: 32.4 Gy
Reference Point Session Dosage Given: 1.8 Gy
Session Number: 18

## 2023-08-29 ENCOUNTER — Ambulatory Visit: Payer: Medicaid Other | Admitting: Radiation Oncology

## 2023-08-29 ENCOUNTER — Ambulatory Visit
Admission: RE | Admit: 2023-08-29 | Discharge: 2023-08-29 | Disposition: A | Discharge status: Home / Self Care | Payer: Medicaid Other | Source: Ambulatory Visit | Attending: Radiation Oncology

## 2023-08-29 ENCOUNTER — Other Ambulatory Visit: Payer: Self-pay

## 2023-08-29 DIAGNOSIS — Z510 Encounter for antineoplastic radiation therapy: Secondary | ICD-10-CM | POA: Diagnosis not present

## 2023-08-29 LAB — RAD ONC ARIA SESSION SUMMARY
Course Elapsed Days: 35
Plan Fractions Treated to Date: 19
Plan Prescribed Dose Per Fraction: 1.8 Gy
Plan Total Fractions Prescribed: 25
Plan Total Prescribed Dose: 45 Gy
Reference Point Dosage Given to Date: 34.2 Gy
Reference Point Session Dosage Given: 1.8 Gy
Session Number: 19

## 2023-08-30 ENCOUNTER — Ambulatory Visit: Payer: Medicaid Other

## 2023-08-30 ENCOUNTER — Other Ambulatory Visit: Payer: Self-pay

## 2023-08-30 ENCOUNTER — Ambulatory Visit
Admission: RE | Admit: 2023-08-30 | Discharge: 2023-08-30 | Disposition: A | Discharge status: Home / Self Care | Payer: Medicaid Other | Source: Ambulatory Visit | Attending: Radiation Oncology

## 2023-08-30 DIAGNOSIS — Z510 Encounter for antineoplastic radiation therapy: Secondary | ICD-10-CM | POA: Diagnosis not present

## 2023-08-30 LAB — RAD ONC ARIA SESSION SUMMARY
Course Elapsed Days: 36
Plan Fractions Treated to Date: 20
Plan Prescribed Dose Per Fraction: 1.8 Gy
Plan Total Fractions Prescribed: 25
Plan Total Prescribed Dose: 45 Gy
Reference Point Dosage Given to Date: 36 Gy
Reference Point Session Dosage Given: 1.8 Gy
Session Number: 20

## 2023-08-31 ENCOUNTER — Ambulatory Visit: Payer: Medicaid Other

## 2023-08-31 ENCOUNTER — Ambulatory Visit: Payer: Medicaid Other | Admitting: Radiation Oncology

## 2023-08-31 ENCOUNTER — Telehealth: Payer: Self-pay | Admitting: Radiation Oncology

## 2023-08-31 NOTE — Telephone Encounter (Signed)
2/12 @ 2:20 pm Patient left voicemail to be reschedule for her CT Sim/UT weekly check.  Email forward to CT Sim/Support RTT and copied Rober Minion, RN, so they are aware.

## 2023-09-01 ENCOUNTER — Ambulatory Visit
Admission: RE | Admit: 2023-09-01 | Discharge: 2023-09-01 | Disposition: A | Discharge status: Home / Self Care | Payer: Medicaid Other | Source: Ambulatory Visit | Attending: Radiation Oncology | Admitting: Radiation Oncology

## 2023-09-01 ENCOUNTER — Ambulatory Visit: Payer: Medicaid Other

## 2023-09-01 ENCOUNTER — Other Ambulatory Visit: Payer: Self-pay

## 2023-09-01 ENCOUNTER — Ambulatory Visit: Payer: Medicaid Other | Admitting: Radiation Oncology

## 2023-09-01 DIAGNOSIS — Z510 Encounter for antineoplastic radiation therapy: Secondary | ICD-10-CM | POA: Diagnosis not present

## 2023-09-01 LAB — RAD ONC ARIA SESSION SUMMARY
Course Elapsed Days: 38
Plan Fractions Treated to Date: 21
Plan Prescribed Dose Per Fraction: 1.8 Gy
Plan Total Fractions Prescribed: 25
Plan Total Prescribed Dose: 45 Gy
Reference Point Dosage Given to Date: 37.8 Gy
Reference Point Session Dosage Given: 1.8 Gy
Session Number: 21

## 2023-09-02 ENCOUNTER — Ambulatory Visit
Admission: RE | Admit: 2023-09-02 | Discharge: 2023-09-02 | Disposition: A | Discharge status: Home / Self Care | Payer: Medicaid Other | Source: Ambulatory Visit | Attending: Radiation Oncology | Admitting: Radiation Oncology

## 2023-09-02 ENCOUNTER — Other Ambulatory Visit: Payer: Self-pay

## 2023-09-02 ENCOUNTER — Ambulatory Visit: Payer: Medicaid Other

## 2023-09-02 ENCOUNTER — Ambulatory Visit: Payer: Medicaid Other | Admitting: Radiation Oncology

## 2023-09-02 DIAGNOSIS — Z510 Encounter for antineoplastic radiation therapy: Secondary | ICD-10-CM | POA: Diagnosis not present

## 2023-09-02 LAB — RAD ONC ARIA SESSION SUMMARY
Course Elapsed Days: 39
Plan Fractions Treated to Date: 22
Plan Prescribed Dose Per Fraction: 1.8 Gy
Plan Total Fractions Prescribed: 25
Plan Total Prescribed Dose: 45 Gy
Reference Point Dosage Given to Date: 39.6 Gy
Reference Point Session Dosage Given: 1.8 Gy
Session Number: 22

## 2023-09-05 ENCOUNTER — Ambulatory Visit
Admission: RE | Admit: 2023-09-05 | Discharge: 2023-09-05 | Disposition: A | Discharge status: Home / Self Care | Payer: Medicaid Other | Source: Ambulatory Visit | Attending: Radiation Oncology | Admitting: Radiation Oncology

## 2023-09-05 ENCOUNTER — Other Ambulatory Visit: Payer: Self-pay

## 2023-09-05 ENCOUNTER — Ambulatory Visit: Payer: Medicaid Other

## 2023-09-05 DIAGNOSIS — Z510 Encounter for antineoplastic radiation therapy: Secondary | ICD-10-CM | POA: Diagnosis not present

## 2023-09-05 LAB — RAD ONC ARIA SESSION SUMMARY
Course Elapsed Days: 42
Plan Fractions Treated to Date: 23
Plan Prescribed Dose Per Fraction: 1.8 Gy
Plan Total Fractions Prescribed: 25
Plan Total Prescribed Dose: 45 Gy
Reference Point Dosage Given to Date: 41.4 Gy
Reference Point Session Dosage Given: 1.8 Gy
Session Number: 23

## 2023-09-06 ENCOUNTER — Ambulatory Visit: Payer: Medicaid Other | Admitting: Radiation Oncology

## 2023-09-06 ENCOUNTER — Ambulatory Visit
Admission: RE | Admit: 2023-09-06 | Discharge: 2023-09-06 | Disposition: A | Discharge status: Home / Self Care | Payer: Medicaid Other | Source: Ambulatory Visit | Attending: Radiation Oncology

## 2023-09-06 ENCOUNTER — Ambulatory Visit: Payer: Medicaid Other

## 2023-09-06 ENCOUNTER — Other Ambulatory Visit: Payer: Self-pay

## 2023-09-06 DIAGNOSIS — Z510 Encounter for antineoplastic radiation therapy: Secondary | ICD-10-CM | POA: Diagnosis not present

## 2023-09-06 LAB — RAD ONC ARIA SESSION SUMMARY
Course Elapsed Days: 43
Plan Fractions Treated to Date: 24
Plan Prescribed Dose Per Fraction: 1.8 Gy
Plan Total Fractions Prescribed: 25
Plan Total Prescribed Dose: 45 Gy
Reference Point Dosage Given to Date: 43.2 Gy
Reference Point Session Dosage Given: 1.8 Gy
Session Number: 24

## 2023-09-07 ENCOUNTER — Ambulatory Visit: Payer: Medicaid Other | Admitting: Radiation Oncology

## 2023-09-07 NOTE — Assessment & Plan Note (Signed)
 [id number], stage IV,with oligo lung metastasis, intact MMR, KRAS G12V (+) -We reviewed her medical record and workup in detail with the patient and her daughter.  She presented with hematochezia since 05/2022 -Workup showed a near obstructing rectal mass. Path confirmed adenocarcinoma. Baseline CEA elevated to 18.1.  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  I added bevacizumab to her chemo from cycle 3 -restaging CT 11/9 showed good PR, she completed a total of 8 cycle chemo then started concurrent chemoRT to rectal cancer on 07/25/2023

## 2023-09-08 ENCOUNTER — Ambulatory Visit: Payer: Medicaid Other | Admitting: Radiation Oncology

## 2023-09-08 ENCOUNTER — Other Ambulatory Visit: Payer: Self-pay

## 2023-09-08 ENCOUNTER — Encounter: Payer: Self-pay | Admitting: Hematology

## 2023-09-08 ENCOUNTER — Inpatient Hospital Stay: Payer: Medicaid Other

## 2023-09-08 ENCOUNTER — Ambulatory Visit
Admission: RE | Admit: 2023-09-08 | Discharge: 2023-09-08 | Disposition: A | Discharge status: Home / Self Care | Payer: Medicaid Other | Source: Ambulatory Visit | Attending: Radiation Oncology

## 2023-09-08 ENCOUNTER — Inpatient Hospital Stay (HOSPITAL_BASED_OUTPATIENT_CLINIC_OR_DEPARTMENT_OTHER): Payer: Medicaid Other | Admitting: Hematology

## 2023-09-08 VITALS — BP 130/59 | HR 77 | Temp 97.3°F | Resp 16 | Wt 163.1 lb

## 2023-09-08 DIAGNOSIS — C20 Malignant neoplasm of rectum: Secondary | ICD-10-CM

## 2023-09-08 DIAGNOSIS — Z510 Encounter for antineoplastic radiation therapy: Secondary | ICD-10-CM | POA: Diagnosis not present

## 2023-09-08 DIAGNOSIS — Z95828 Presence of other vascular implants and grafts: Secondary | ICD-10-CM

## 2023-09-08 LAB — COMPREHENSIVE METABOLIC PANEL
ALT: 11 U/L (ref 0–44)
AST: 15 U/L (ref 15–41)
Albumin: 3.8 g/dL (ref 3.5–5.0)
Alkaline Phosphatase: 59 U/L (ref 38–126)
Anion gap: 5 (ref 5–15)
BUN: 8 mg/dL (ref 6–20)
CO2: 29 mmol/L (ref 22–32)
Calcium: 9.3 mg/dL (ref 8.9–10.3)
Chloride: 102 mmol/L (ref 98–111)
Creatinine, Ser: 0.77 mg/dL (ref 0.44–1.00)
GFR, Estimated: >60 mL/min (ref >60)
Glucose, Bld: 108 mg/dL — ABNORMAL HIGH (ref 70–99)
Potassium: 4.4 mmol/L (ref 3.5–5.1)
Sodium: 136 mmol/L (ref 135–145)
Total Bilirubin: 0.5 mg/dL (ref 0.0–1.2)
Total Protein: 6.5 g/dL (ref 6.5–8.1)

## 2023-09-08 LAB — RAD ONC ARIA SESSION SUMMARY
Course Elapsed Days: 45
Plan Fractions Treated to Date: 25
Plan Prescribed Dose Per Fraction: 1.8 Gy
Plan Total Fractions Prescribed: 25
Plan Total Prescribed Dose: 45 Gy
Reference Point Dosage Given to Date: 45 Gy
Reference Point Session Dosage Given: 1.8 Gy
Session Number: 25

## 2023-09-08 LAB — CBC WITH DIFFERENTIAL/PLATELET
Abs Immature Granulocytes: 0.02 10*3/uL (ref 0.00–0.07)
Basophils Absolute: 0 10*3/uL (ref 0.0–0.1)
Basophils Relative: 1 %
Eosinophils Absolute: 0.3 10*3/uL (ref 0.0–0.5)
Eosinophils Relative: 7 %
HCT: 40.9 % (ref 36.0–46.0)
Hemoglobin: 13.4 g/dL (ref 12.0–15.0)
Immature Granulocytes: 1 %
Lymphocytes Relative: 13 %
Lymphs Abs: 0.6 10*3/uL — ABNORMAL LOW (ref 0.7–4.0)
MCH: 34.6 pg — ABNORMAL HIGH (ref 26.0–34.0)
MCHC: 32.8 g/dL (ref 30.0–36.0)
MCV: 105.7 fL — ABNORMAL HIGH (ref 80.0–100.0)
Monocytes Absolute: 0.6 10*3/uL (ref 0.1–1.0)
Monocytes Relative: 13 %
Neutro Abs: 2.8 10*3/uL (ref 1.7–7.7)
Neutrophils Relative %: 65 %
Platelets: 172 10*3/uL (ref 150–400)
RBC: 3.87 MIL/uL (ref 3.87–5.11)
RDW: 23 % — ABNORMAL HIGH (ref 11.5–15.5)
WBC: 4.3 10*3/uL (ref 4.0–10.5)
nRBC: 0 % (ref 0.0–0.2)

## 2023-09-08 MED ORDER — HEPARIN SOD (PORK) LOCK FLUSH 100 UNIT/ML IV SOLN
500.0000 [IU] | Freq: Once | INTRAVENOUS | Status: AC
Start: 2023-09-08 — End: 2023-09-08
  Administered 2023-09-08: 500 [IU]

## 2023-09-08 MED ORDER — SODIUM CHLORIDE 0.9% FLUSH
10.0000 mL | Freq: Once | INTRAVENOUS | Status: AC
  Administered 2023-09-08: 10 mL

## 2023-09-08 MED ORDER — FLUCONAZOLE 100 MG PO TABS: 100.0000 mg | ORAL_TABLET | Freq: Every day | ORAL | 0 refills | Status: DC

## 2023-09-08 MED ORDER — AMOXICILLIN-POT CLAVULANATE 875-125 MG PO TABS: 1.0000 | ORAL_TABLET | Freq: Two times a day (BID) | ORAL | 0 refills | Status: DC

## 2023-09-08 NOTE — Progress Notes (Signed)
 [facility]   [phone] [phone]   Clinic Follow up Note   Patient Care Team: System, Provider Not In as PCP - General Gollan, Tollie Pizza, MD as PCP - Cardiology (Cardiology) Delma Freeze, FNP as Nurse Practitioner (Family Medicine) Mariah Milling, Tollie Pizza, MD as Consulting Physician (Cardiology) Merwyn Katos, MD (Inactive) as Consulting Physician (Pulmonary Disease) Jim Like, RN as Registered Nurse Scarlett Presto, RN (Inactive) as Registered Nurse Benita Gutter, RN as Oncology Nurse Navigator Malachy Mood, MD as Consulting Physician (Oncology) Pickenpack-Cousar, Arty Baumgartner, NP as Nurse Practitioner (Hospice and Palliative Medicine) Dorothy Puffer, MD as Consulting Physician (Radiation Oncology) Wyline Mood, MD as Consulting Physician (Gastroenterology) Raechel Chute, MD as Consulting Physician (Pulmonary Disease)  Date of Service:  09/08/2023  CHIEF COMPLAINT: f/u of rectal cancer  CURRENT THERAPY:  Concurrent chemoradiation with Xeloda  Oncology History   Rectal cancer (HCC) cT3cN2M1, stage IV,with oligo lung metastasis, intact MMR, KRAS G12V (+) -We reviewed her medical record and workup in detail with the patient and her daughter.  She presented with hematochezia since 05/2022 -Workup showed a near obstructing rectal mass. Path confirmed adenocarcinoma. Baseline CEA elevated to 18.1.  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  I added bevacizumab to her chemo from cycle 3 -restaging CT 11/9 showed good PR, she completed a total of 8 cycle chemo then started concurrent chemoRT to rectal cancer on 07/25/2023   Assessment and  Plan    Colorectal Cancer 58 year old female with colorectal cancer undergoing radiation therapy. Tumor located in the internal sphincter of the rectum. Last CT scan in November was favorable. CEA levels have decreased but are not yet normal. Patient tolerating treatment well. Last radiation session for colon scheduled for March 7, followed by lung radiation. Discussed with colorectal surgeon Dr. Cliffton Asters about potential surgery. Dr. Cliffton Asters indicated a 30% or less chance of complete remission with radiation alone, and surgery with a colostomy bag may be necessary. Surgery may not significantly impact life expectancy due to lung metastasis. - Order whole body CT scan in three weeks - Follow-up appointment in four weeks - Coordinate with Dr. Cliffton Asters for potential surgery consultation after CT scan  Sinus Infection and Bronchitis Reports greenish sputum, nasal congestion, and post-nasal drip for about a week. No fever. Asthma. Allergic to penicillin but tolerates Augmentin. - Prescribe Augmentin - Prescribe fluconazole (two pills) to prevent yeast infection - Send prescription to [facility] on [address] in [place]  Smoking Cessation Long history of smoking since age 44 and finds it difficult to quit. Acknowledges the challenge and is attempting to reduce smoking. - Encourage continued efforts to cut down smoking  Follow-up - Schedule CT scan the week after finishing radiation - Follow-up appointment in four weeks to review CT  -I called in Augmentin and fluconazole for her      SUMMARY OF ONCOLOGIC HISTORY: Oncology History Overview Note   Cancer Staging  Rectal cancer [facility]) Staging form: Colon and Rectum, AJCC 8th Edition - Clinical stage from 12/23/2022: Stage IVA (cT3, cN2, pM1a) - Signed by Rickard Patience, MD on 02/04/2023 Stage prefix: Initial diagnosis     Rectal cancer (HCC)  12/15/2022 Pathology Results   Colonoscopy:  Positive for invasive adenocarcinoma, G1 well differentiated       12/15/2022 Procedure   Colonoscopy under the care of Dr. Sharlet Salina   Findings: -The perianal and digital rectal examinations were normal. -Extensive amounts of liquid semi- liquid semi- solid stool was found in the entire colon -A partially obstructing large mass was found in the rectum. The mass was circumferential.   12/23/2022 Initial Diagnosis   Rectal cancer [facility]) Patient was seen by gastroenterology due to hematochezia as well as unintentional weight loss.  12/15/2022, colonoscopy showed fungating partially obstructing large mass in the rectum, circumferential, mass extended from about 15 cm from anus to about 8 cm from anal verge.  Biopsy was taken. Poor preparation of colon.  Colonoscopy was repeated on 12/16/2022, preparations inadequate.  Pathology showed invasive adenocarcinoma.   12/23/2022 Cancer Staging   Staging form: Colon and Rectum, AJCC 8th Edition - Clinical stage from 12/23/2022: Stage Unknown (cTX, cNX) - Signed by Rickard Patience, MD on 12/23/2022 Stage prefix: Initial diagnosis  Staging form: Colon and Rectum, AJCC 8th Edition - Clinical stage from 12/23/2022: Stage IVA (cT3, cN2, pM1a) - Signed by Rickard Patience, MD on 02/04/2023 Stage prefix: Initial diagnosis   12/23/2022 Tumor Marker   Patient's tumor was tested for the following markers: CEA. Results of the tumor marker test revealed elevated at 18.1.   01/03/2023 Imaging   CT chest abdomen with contrast showed 2.2 cm left upper lobe nodule, suspicious for metastasis. No findings suspicious for metastatic disease in the abdomen. Bilateral adrenal adenomas, benign. Cholelithiasis, without associated inflammatory changes. Aortic Atherosclerosis (ICD10-I70.0) and Emphysema   01/08/2023 Imaging   MRI pelvis without contrast-rectal protocol showed 9.2 cm concentric mid/lower rectal mass, corresponding to the patient's known primary rectal adenocarcinoma, as above. Rectal adenocarcinoma T stage: T3c Rectal adenocarcinoma N stage:   N2 Distance from tumor to the internal anal sphincter is 3.4 cm.   01/27/2023 Procedure   Patient underwent biopsy via bronchoscopy by pulmonology. Biopsy pathology showed adenocarcinoma, Immunohistochemistry is positive with cytokeratin 20 and CDX2 and negative with cytokeratin 7, TTF-1 and napsin A consistent with colonic adenocarcinoma    01/27/2023 Pathology Results   CASE: [id number]   FINAL MICROSCOPIC DIAGNOSIS:  A. LUNG, LUL, FINE NEEDLE ASPIRATION:  Adenocarcinoma  See comment   ADDENDUM:  Immunohistochemistry is positive with cytokeratin 20 and CDX2  and negative with cytokeratin 7, TTF-1 and napsin A consistent with  colonic adenocarcinoma.    02/28/2023 Miscellaneous      03/03/2023 -  Chemotherapy   Patient is on Treatment Plan : COLORECTAL FOLFIRI q14d     05/13/2023 Imaging   CT chest, abdomen, and pelvis with contrast IMPRESSION: 1. Response to therapy of rectal primary, pelvic nodal, and isolated pulmonary metastasis. 2. Moderate amount of stool upstream from the rectal primary. Partial obstruction cannot be excluded. 3. No new or progressive disease. 4. Right Port-A-Cath with anterior SVC pericatheter small volume thrombus. 5. Age advanced coronary artery atherosclerosis. Recommend assessment of coronary risk factors. 6. Incidental findings, including: Cholelithiasis. Aortic Atherosclerosis (ICD10-I70.0).      Discussed the use of AI scribe software for clinical note transcription with the patient, who gave verbal consent to proceed.  History of Present Illness   The patient, a 58 year old female with a history of colorectal cancer, presents for follow-up. She reports symptoms of a sinus infection and bronchitis, including greenish sputum, nasal congestion, and a scratchy throat, which have been ongoing for about a week. She denies fever. She also reports  shortness of breath with exertion, which she attributes to her asthma. She is currently undergoing  radiation therapy for her colorectal cancer, with her last session for the colon scheduled for the following day and radiation for the lung to start on the 24th. She also mentions a potential need for surgery and a colostomy bag in the future, depending on the outcome of the radiation therapy. She is a long-term smoker and has been trying to quit.         All other systems were reviewed with the patient and are negative.  MEDICAL HISTORY:  Past Medical History:  Diagnosis Date   (HFpEF) heart failure with preserved ejection fraction (HCC)    a. 05/2015 Echo: EF 60-65%, no rwma, PASP .   Acute pancreatitis 08/13/2018   Anxiety    Arthritis    Asthma    Bell's palsy    no deficit   Bronchitis    Cancer (HCC) 12/2022   rectal cancer   CHF (congestive heart failure) (HCC)    COPD (chronic obstructive pulmonary disease) (HCC)    Depression    Diabetes mellitus without complication (HCC)    type II 03/2017   Dyspnea    Fatty liver    per patient   Gall stones    per patient   Gastric ulcer    Hyperlipidemia    Hypertension    Lung nodule 12/2022   upper left   OSA (obstructive sleep apnea)    a. did not tolerate CPAP. On oxygen 2L via [place]   Pancreatitis    07/2018   Shoulder injury    6/19   Smoker     SURGICAL HISTORY: Past Surgical History:  Procedure Laterality Date   BRONCHIAL NEEDLE ASPIRATION BIOPSY  01/27/2023   Procedure: BRONCHIAL NEEDLE ASPIRATION BIOPSIES;  Surgeon: Raechel Chute, MD;  Location: [facility] ENDOSCOPY;  Service: Pulmonary;;   COLONOSCOPY WITH PROPOFOL N/A 12/15/2022   Procedure: COLONOSCOPY WITH PROPOFOL;  Surgeon: Wyline Mood, MD;  Location: [facility] ENDOSCOPY;  Service: Gastroenterology;  Laterality: N/A;   COLONOSCOPY WITH PROPOFOL N/A 12/16/2022   Procedure: COLONOSCOPY WITH PROPOFOL;  Surgeon: Wyline Mood, MD;  Location: [facility] ENDOSCOPY;  Service: Gastroenterology;  Laterality: N/A;   FLEXIBLE BRONCHOSCOPY N/A 06/20/2015   Procedure: FLEXIBLE  BRONCHOSCOPY;  Surgeon: Shane Crutch, MD;  Location: [facility] ORS;  Service: Pulmonary;  Laterality: N/A;   IR IMAGING GUIDED PORT INSERTION  02/25/2023   KNEE SURGERY Left    8 knee surgeries    I have reviewed the social history and family history with the patient and they are unchanged from previous note.  ALLERGIES:  is allergic to tramadol and penicillins.  MEDICATIONS:  Current Outpatient Medications  Medication Sig Dispense Refill   albuterol (VENTOLIN HFA) 108 (90 Base) MCG/ACT inhaler Inhale 2 puffs into the lungs every 6 (six) hours as needed for wheezing or shortness of breath.      Blood Glucose Monitoring Suppl (ACCU-CHEK AVIVA PLUS) w/Device KIT See admin instructions.  0   busPIRone (BUSPAR) 30 MG tablet Take 30 mg by mouth 2 (two) times daily. PER PT STATES HER DOSE WAS INCREASED     capecitabine (XELODA) 500 MG tablet Take 3 tablets (1,500 mg total) by mouth 2 (two) times daily after a meal. Take on days of radiation only, Monday through Friday 90 tablet 1   clonazePAM (KLONOPIN) 0.5 MG tablet Take 0.5 mg by mouth at bedtime as needed for anxiety.     diltiazem (CARDIZEM CD) 120  MG 24 hr capsule TAKE 1 CAPSULE BY MOUTH EVERY DAY 180 capsule 2   diltiazem (CARDIZEM) 30 MG tablet TAKE 1 TABLET(30 MG) BY MOUTH THREE TIMES DAILY AS NEEDED 90 tablet 3   DULoxetine (CYMBALTA) 60 MG capsule Take 60 mg by mouth 2 (two) times daily.      ezetimibe (ZETIA) 10 MG tablet Take 10 mg by mouth at bedtime.     Fluticasone-Salmeterol (ADVAIR) 250-50 MCG/DOSE AEPB Inhale 1 puff into the lungs 2 (two) times daily.     furosemide (LASIX) 40 MG tablet TAKE 1 TABLET BY MOUTH EVERY DAY. MAY TAKE ADDITIONAL TABLET IN THE AM FOR WEIGHT GAIN AS NEEDED 110 tablet 2   JARDIANCE 25 MG TABS tablet Take 25 mg by mouth daily.     lidocaine-prilocaine (EMLA) cream Apply to affected area once 30 g 3   magic mouthwash (nystatin, lidocaine, diphenhydrAMINE, alum & mag hydroxide) suspension Take 5 mLs by  mouth 4 (four) times daily as needed for mouth pain. 140 mL 1   metFORMIN (GLUCOPHAGE-XR) 500 MG 24 hr tablet Take 1,000 mg by mouth 2 (two) times daily.     ondansetron (ZOFRAN) 8 MG tablet Take 1 tablet (8 mg total) by mouth every 8 (eight) hours as needed for nausea, vomiting or refractory nausea / vomiting. Start on the third day after chemotherapy. 30 tablet 1   Oxycodone HCl 10 MG TABS Take 1 tablet (10 mg total) by mouth 3 (three) times daily as needed. 90 tablet 0   OXYGEN Inhale 2 L into the lungs at bedtime. Or as needed throughout the day if short of breath     pantoprazole (PROTONIX) 40 MG tablet Take 1 tablet (40 mg total) by mouth daily. 30 tablet 0   potassium chloride SA (KLOR-CON) 20 MEQ tablet Take 20 mEq by mouth 2 (two) times daily.     pregabalin (LYRICA) 150 MG capsule Take 1 capsule (150 mg total) by mouth 3 (three) times daily. 90 capsule 0   prochlorperazine (COMPAZINE) 10 MG tablet Take 1 tablet (10 mg total) by mouth every 6 (six) hours as needed for nausea or vomiting. 30 tablet 1   spironolactone (ALDACTONE) 25 MG tablet TAKE 1 TABLET(25 MG) BY MOUTH EVERY MORNING 90 tablet 3   sucralfate (CARAFATE) 1 G tablet Take 1 tablet (1 g total) by mouth 4 (four) times daily -  with meals and at bedtime. (Patient taking differently: Take 1 g by mouth 2 (two) times daily.) 120 tablet 0   tiotropium (SPIRIVA) 18 MCG inhalation capsule Place 18 mcg into inhaler and inhale daily.     tiZANidine (ZANAFLEX) 4 MG tablet Take 2 tablets (8 mg total) by mouth every 8 (eight) hours as needed for muscle spasms. 30 tablet 2   Varenicline Tartrate, Starter, 0.5 MG X 11 & 1 MG X 42 TBPK See admin instructions. follow package directions     zolpidem (AMBIEN) 10 MG tablet Take 10 mg by mouth at bedtime as needed for sleep.     No current facility-administered medications for this visit.    PHYSICAL EXAMINATION: ECOG PERFORMANCE STATUS: 1 - Symptomatic but completely ambulatory  Vitals:    09/08/23 1310  BP: (!) 130/59  Pulse: 77  Resp: 16  Temp: (!) 97.3 F (36.3 C)  SpO2: 100%   Wt Readings from Last 3 Encounters:  09/08/23 163 lb 1.6 oz (74 kg)  08/25/23 161 lb 4.8 oz (73.2 kg)  07/28/23 168 lb 11.2 oz (76.5 kg)  GENERAL:alert, no distress and comfortable SKIN: skin color, texture, turgor are normal, no rashes or significant lesions EYES: normal, Conjunctiva are pink and non-injected, sclera clear NECK: supple, thyroid normal size, non-tender, without nodularity LYMPH:  no palpable lymphadenopathy in the cervical, axillary  LUNGS: clear to auscultation and percussion with normal breathing effort HEART: regular rate & rhythm and no murmurs and no lower extremity edema ABDOMEN:abdomen soft, non-tender and normal bowel sounds Musculoskeletal:no cyanosis of digits and no clubbing  NEURO: alert & oriented x 3 with fluent speech, no focal motor/sensory deficits       LABORATORY DATA:  I have reviewed the data as listed    Latest Ref Rng & Units 09/08/2023   12:50 PM 08/25/2023    1:15 PM 07/28/2023   12:40 PM  CBC  WBC 4.0 - 10.5 K/uL 4.3  5.9  9.0   Hemoglobin 12.0 - 15.0 g/dL 69.6  29.5  28.4   Hematocrit 36.0 - 46.0 % 40.9  41.6  40.2   Platelets 150 - 400 K/uL 172  165  156         Latest Ref Rng & Units 09/08/2023   12:50 PM 08/25/2023    1:15 PM 07/28/2023   12:40 PM  CMP  Glucose 70 - 99 mg/dL 132  440  102   BUN 6 - 20 mg/dL 8  10  9    Creatinine 0.44 - 1.00 mg/dL 7.25  3.66  4.40   Sodium 135 - 145 mmol/L 136  136  136   Potassium 3.5 - 5.1 mmol/L 4.4  4.2  4.2   Chloride 98 - 111 mmol/L 102  103  102   CO2 22 - 32 mmol/L 29  29  28    Calcium 8.9 - 10.3 mg/dL 9.3  9.2  9.2   Total Protein 6.5 - 8.1 g/dL 6.5  6.7  6.6   Total Bilirubin 0.0 - 1.2 mg/dL 0.5  0.4  0.5   Alkaline Phos 38 - 126 U/L 59  78  88   AST 15 - 41 U/L 15  16  22    ALT 0 - 44 U/L 11  13  23        RADIOGRAPHIC STUDIES: I have personally reviewed the radiological images  as listed and agreed with the findings in the report. No results found.    Orders Placed This Encounter  Procedures   CT CHEST ABDOMEN PELVIS W CONTRAST    Standing Status:   Future    Expected Date:   09/29/2023    Expiration Date:   09/07/2024    If indicated for the ordered procedure, I authorize the administration of contrast media per Radiology protocol:   Yes    Does the patient have a contrast media/X-ray dye allergy?:   No    Is patient pregnant?:   No    Preferred imaging location?:   [facility]    If indicated for the ordered procedure, I authorize the administration of oral contrast media per Radiology protocol:   Yes   All questions were answered. The patient knows to call the clinic with any problems, questions or concerns. No barriers to learning was detected. The total time spent in the appointment was 25 minutes.     Malachy Mood, MD 09/08/2023

## 2023-09-09 ENCOUNTER — Other Ambulatory Visit: Payer: Self-pay

## 2023-09-09 ENCOUNTER — Ambulatory Visit: Payer: Medicaid Other

## 2023-09-09 NOTE — Progress Notes (Signed)
 Xeloda therapy completed. Disenrolling.

## 2023-09-12 ENCOUNTER — Ambulatory Visit
Admission: RE | Admit: 2023-09-12 | Discharge: 2023-09-12 | Disposition: A | Discharge status: Home / Self Care | Payer: Medicaid Other | Source: Ambulatory Visit | Attending: Radiation Oncology

## 2023-09-12 ENCOUNTER — Other Ambulatory Visit: Payer: Self-pay

## 2023-09-12 ENCOUNTER — Ambulatory Visit: Payer: Medicaid Other

## 2023-09-12 DIAGNOSIS — Z510 Encounter for antineoplastic radiation therapy: Secondary | ICD-10-CM | POA: Diagnosis not present

## 2023-09-12 LAB — RAD ONC ARIA SESSION SUMMARY
Course Elapsed Days: 49
Plan Fractions Treated to Date: 1
Plan Prescribed Dose Per Fraction: 1.8 Gy
Plan Total Fractions Prescribed: 3
Plan Total Prescribed Dose: 5.4 Gy
Reference Point Dosage Given to Date: 1.8 Gy
Reference Point Session Dosage Given: 1.8 Gy
Session Number: 26

## 2023-09-13 ENCOUNTER — Other Ambulatory Visit: Payer: Self-pay

## 2023-09-13 ENCOUNTER — Ambulatory Visit: Admission: RE | Admit: 2023-09-13 | Payer: Medicaid Other | Source: Ambulatory Visit | Admitting: Radiation Oncology

## 2023-09-13 ENCOUNTER — Ambulatory Visit
Admission: RE | Admit: 2023-09-13 | Discharge: 2023-09-13 | Disposition: A | Discharge status: Home / Self Care | Payer: Medicaid Other | Source: Ambulatory Visit | Attending: Radiation Oncology | Admitting: Radiation Oncology

## 2023-09-13 DIAGNOSIS — Z510 Encounter for antineoplastic radiation therapy: Secondary | ICD-10-CM | POA: Diagnosis not present

## 2023-09-13 LAB — RAD ONC ARIA SESSION SUMMARY
Course Elapsed Days: 50
Course Elapsed Days: 50
Plan Fractions Treated to Date: 2
Plan Fractions Treated to Date: 1
Plan Prescribed Dose Per Fraction: 1.8 Gy
Plan Prescribed Dose Per Fraction: 10 Gy
Plan Total Fractions Prescribed: 3
Plan Total Fractions Prescribed: 5
Plan Total Prescribed Dose: 5.4 Gy
Plan Total Prescribed Dose: 50 Gy
Reference Point Dosage Given to Date: 3.6 Gy
Reference Point Dosage Given to Date: 10 Gy
Reference Point Session Dosage Given: 1.8 Gy
Reference Point Session Dosage Given: 10 Gy
Session Number: 27
Session Number: 28

## 2023-09-14 ENCOUNTER — Other Ambulatory Visit: Payer: Self-pay

## 2023-09-14 ENCOUNTER — Ambulatory Visit
Admission: RE | Admit: 2023-09-14 | Discharge: 2023-09-14 | Disposition: A | Discharge status: Home / Self Care | Payer: Medicaid Other | Source: Ambulatory Visit | Attending: Radiation Oncology

## 2023-09-14 ENCOUNTER — Ambulatory Visit
Admission: RE | Admit: 2023-09-14 | Discharge: 2023-09-14 | Discharge status: Home / Self Care | Payer: Medicaid Other | Source: Ambulatory Visit | Attending: Radiation Oncology

## 2023-09-14 ENCOUNTER — Ambulatory Visit: Payer: Medicaid Other | Admitting: Radiation Oncology

## 2023-09-14 DIAGNOSIS — Z510 Encounter for antineoplastic radiation therapy: Secondary | ICD-10-CM | POA: Diagnosis not present

## 2023-09-14 LAB — RAD ONC ARIA SESSION SUMMARY
Course Elapsed Days: 51
Plan Fractions Treated to Date: 3
Plan Prescribed Dose Per Fraction: 1.8 Gy
Plan Total Fractions Prescribed: 3
Plan Total Prescribed Dose: 5.4 Gy
Reference Point Dosage Given to Date: 5.4 Gy
Reference Point Session Dosage Given: 1.8 Gy
Session Number: 29

## 2023-09-15 ENCOUNTER — Ambulatory Visit: Payer: Medicaid Other | Admitting: Radiation Oncology

## 2023-09-16 ENCOUNTER — Ambulatory Visit
Admission: RE | Admit: 2023-09-16 | Discharge: 2023-09-16 | Disposition: A | Discharge status: Home / Self Care | Payer: Medicaid Other | Source: Ambulatory Visit | Attending: Radiation Oncology | Admitting: Radiation Oncology

## 2023-09-16 ENCOUNTER — Other Ambulatory Visit: Payer: Self-pay

## 2023-09-16 ENCOUNTER — Ambulatory Visit: Payer: Medicaid Other | Admitting: Radiation Oncology

## 2023-09-16 DIAGNOSIS — Z510 Encounter for antineoplastic radiation therapy: Secondary | ICD-10-CM | POA: Diagnosis not present

## 2023-09-16 LAB — RAD ONC ARIA SESSION SUMMARY
Course Elapsed Days: 53
Plan Fractions Treated to Date: 2
Plan Prescribed Dose Per Fraction: 10 Gy
Plan Total Fractions Prescribed: 5
Plan Total Prescribed Dose: 50 Gy
Reference Point Dosage Given to Date: 20 Gy
Reference Point Session Dosage Given: 10 Gy
Session Number: 30

## 2023-09-19 ENCOUNTER — Ambulatory Visit
Admission: RE | Admit: 2023-09-19 | Discharge: 2023-09-19 | Disposition: A | Discharge status: Home / Self Care | Payer: Medicaid Other | Source: Ambulatory Visit | Attending: Radiation Oncology | Admitting: Radiation Oncology

## 2023-09-19 ENCOUNTER — Other Ambulatory Visit: Payer: Self-pay

## 2023-09-19 DIAGNOSIS — C3482 Malignant neoplasm of overlapping sites of left bronchus and lung: Secondary | ICD-10-CM | POA: Insufficient documentation

## 2023-09-19 DIAGNOSIS — Z510 Encounter for antineoplastic radiation therapy: Secondary | ICD-10-CM | POA: Insufficient documentation

## 2023-09-19 LAB — RAD ONC ARIA SESSION SUMMARY
Course Elapsed Days: 56
Plan Fractions Treated to Date: 3
Plan Prescribed Dose Per Fraction: 10 Gy
Plan Total Fractions Prescribed: 5
Plan Total Prescribed Dose: 50 Gy
Reference Point Dosage Given to Date: 30 Gy
Reference Point Session Dosage Given: 10 Gy
Session Number: 31

## 2023-09-20 NOTE — Progress Notes (Unsigned)
 Palliative Medicine [facility]  Telephone:(336) [phone] Fax:(336) [phone]   Name: Erica [name] Date: 09/20/2023 MRN: [id number]  DOB: Jul 10, 1966  Patient Care Team: System, Provider Not In as PCP - General Gollan, Tollie Pizza, MD as PCP - Cardiology (Cardiology) Delma Freeze, FNP as Nurse Practitioner (Family Medicine) Antonieta Iba, MD as Consulting Physician (Cardiology) Merwyn Katos, MD (Inactive) as Consulting Physician (Pulmonary Disease) Jim Like, RN as Registered Nurse Scarlett Presto, RN (Inactive) as Registered Nurse Benita Gutter, RN as Oncology Nurse Navigator Malachy Mood, MD as Consulting Physician (Oncology) Pickenpack-Cousar, Arty Baumgartner, NP as Nurse Practitioner (Hospice and Palliative Medicine) Dorothy Puffer, MD as Consulting Physician (Radiation Oncology) Wyline Mood, MD as Consulting Physician (Gastroenterology) Raechel Chute, MD as Consulting Physician (Pulmonary Disease)    INTERVAL HISTORY: Erica [name] is a 58 y.o. 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 Palliative ask to see for symptom management and goals of care.   SOCIAL HISTORY:     reports that she has been smoking cigarettes. She has a 138 pack-year smoking history. She has never used smokeless tobacco. She reports current alcohol use. She reports that she does not use drugs.  ADVANCE DIRECTIVES:  None on file  CODE STATUS: Full code  PAST MEDICAL HISTORY: Past Medical History:  Diagnosis Date   (HFpEF) heart failure with preserved ejection fraction (HCC)    a. 05/2015 Echo: EF 60-65%, no rwma, PASP .   Acute pancreatitis 08/13/2018   Anxiety    Arthritis    Asthma    Bell's palsy    no deficit   Bronchitis    Cancer (HCC) 12/2022   rectal cancer   CHF (congestive heart failure) (HCC)    COPD (chronic obstructive pulmonary  disease) (HCC)    Depression    Diabetes mellitus without complication (HCC)    type II 03/2017   Dyspnea    Fatty liver    per patient   Gall stones    per patient   Gastric ulcer    Hyperlipidemia    Hypertension    Lung nodule 12/2022   upper left   OSA (obstructive sleep apnea)    a. did not tolerate CPAP. On oxygen 2L via [place]   Pancreatitis    07/2018   Shoulder injury    6/19   Smoker     ALLERGIES:  is allergic to tramadol and penicillins.  MEDICATIONS:  Current Outpatient Medications  Medication Sig Dispense Refill   albuterol (VENTOLIN HFA) 108 (90 Base) MCG/ACT inhaler Inhale 2 puffs into the lungs every 6 (six) hours as needed for wheezing or shortness of breath.      amoxicillin-clavulanate (AUGMENTIN) 875-125 MG tablet Take 1 tablet by mouth 2 (two) times daily. 14 tablet 0   Blood Glucose Monitoring Suppl (ACCU-CHEK AVIVA PLUS) w/Device KIT See admin instructions.  0   busPIRone (BUSPAR) 30 MG tablet Take 30 mg by mouth 2 (two) times daily. PER PT STATES HER DOSE WAS INCREASED     capecitabine (XELODA) 500 MG tablet Take 3 tablets (1,500 mg total) by mouth 2 (two) times daily after a meal. Take on days of radiation only, Monday through Friday 90 tablet 1   clonazePAM (KLONOPIN) 0.5 MG tablet Take 0.5 mg by mouth at bedtime as needed for anxiety.     diltiazem (CARDIZEM CD) 120 MG 24 hr capsule TAKE  1 CAPSULE BY MOUTH EVERY DAY 180 capsule 2   diltiazem (CARDIZEM) 30 MG tablet TAKE 1 TABLET(30 MG) BY MOUTH THREE TIMES DAILY AS NEEDED 90 tablet 3   DULoxetine (CYMBALTA) 60 MG capsule Take 60 mg by mouth 2 (two) times daily.      ezetimibe (ZETIA) 10 MG tablet Take 10 mg by mouth at bedtime.     fluconazole (DIFLUCAN) 100 MG tablet Take 1 tablet (100 mg total) by mouth daily. For antibiotics induced vaginitis 2 tablet 0   Fluticasone-Salmeterol (ADVAIR) 250-50 MCG/DOSE AEPB Inhale 1 puff into the lungs 2 (two) times daily.     furosemide (LASIX) 40 MG tablet TAKE 1  TABLET BY MOUTH EVERY DAY. MAY TAKE ADDITIONAL TABLET IN THE AM FOR WEIGHT GAIN AS NEEDED 110 tablet 2   JARDIANCE 25 MG TABS tablet Take 25 mg by mouth daily.     lidocaine-prilocaine (EMLA) cream Apply to affected area once 30 g 3   magic mouthwash (nystatin, lidocaine, diphenhydrAMINE, alum & mag hydroxide) suspension Take 5 mLs by mouth 4 (four) times daily as needed for mouth pain. 140 mL 1   metFORMIN (GLUCOPHAGE-XR) 500 MG 24 hr tablet Take 1,000 mg by mouth 2 (two) times daily.     ondansetron (ZOFRAN) 8 MG tablet Take 1 tablet (8 mg total) by mouth every 8 (eight) hours as needed for nausea, vomiting or refractory nausea / vomiting. Start on the third day after chemotherapy. 30 tablet 1   Oxycodone HCl 10 MG TABS Take 1 tablet (10 mg total) by mouth 3 (three) times daily as needed. 90 tablet 0   OXYGEN Inhale 2 L into the lungs at bedtime. Or as needed throughout the day if short of breath     pantoprazole (PROTONIX) 40 MG tablet Take 1 tablet (40 mg total) by mouth daily. 30 tablet 0   potassium chloride SA (KLOR-CON) 20 MEQ tablet Take 20 mEq by mouth 2 (two) times daily.     pregabalin (LYRICA) 150 MG capsule Take 1 capsule (150 mg total) by mouth 3 (three) times daily. 90 capsule 0   prochlorperazine (COMPAZINE) 10 MG tablet Take 1 tablet (10 mg total) by mouth every 6 (six) hours as needed for nausea or vomiting. 30 tablet 1   spironolactone (ALDACTONE) 25 MG tablet TAKE 1 TABLET(25 MG) BY MOUTH EVERY MORNING 90 tablet 3   sucralfate (CARAFATE) 1 G tablet Take 1 tablet (1 g total) by mouth 4 (four) times daily -  with meals and at bedtime. (Patient taking differently: Take 1 g by mouth 2 (two) times daily.) 120 tablet 0   tiotropium (SPIRIVA) 18 MCG inhalation capsule Place 18 mcg into inhaler and inhale daily.     tiZANidine (ZANAFLEX) 4 MG tablet Take 2 tablets (8 mg total) by mouth every 8 (eight) hours as needed for muscle spasms. 30 tablet 2   Varenicline Tartrate, Starter, 0.5 MG  X 11 & 1 MG X 42 TBPK See admin instructions. follow package directions     zolpidem (AMBIEN) 10 MG tablet Take 10 mg by mouth at bedtime as needed for sleep.     No current facility-administered medications for this visit.    VITAL SIGNS: LMP 03/13/2015 (Approximate) Comment: more than 2 years ago There were no vitals filed for this visit.  Estimated body mass index is 24.8 kg/m (pended) as calculated from the following:   Height as of 07/06/23: (P) 5\' 8"  (1.727 m).   Weight as of 09/08/23: 163  lb 1.6 oz (74 kg).   PERFORMANCE STATUS (ECOG) : 1 - Symptomatic but completely ambulatory   Physical Exam General: NAD Cardiovascular: regular rate and rhythm Pulmonary: normal breathing pattern  Extremities: no edema, no joint deformities Skin: no rashes, chemo induced alopecia  Neurological: AAO x3  Discussed the use of AI scribe software for clinical note transcription with the patient, who gave verbal consent to proceed.  IMPRESSION:  Erica [name] is a 58 year old female with chronic pain who presents for pain management. She is doing well overall. Denies concerns with nausea, vomiting, constipation, or diarrhea. Appetite is good. Occasional fatigue. She tries to remain as active as possible. Resting well at night. States she is nearing the end of her radiation treatments.   She has chronic pain that has remained stable without worsening. Her current pain management regimen includes oxycodone, which will run out tomorrow, pregabalin at 150 mg three times a day, and tizanidine as needed. Her symptoms significantly worsen without muscle relaxers. She is also on duloxetine at 60 mg twice a day, which she had temporarily stopped due to the number of medications she is taking. Gabapentin was previously tried but caused adverse effects such as 'blacking out' and feeling disoriented. Yetta reports her pain is well controlled on her current regimen. No adjustments to be made at this  time.  She experiences severe muscle spasms, particularly at night, affecting both legs and her foot. Despite being on the maximum dose of tizanidine, she continues to have significant spasms. Education provided on use of Flexeril and discontinuing use of Tizanidine. She verbalized understanding.   All questions answered and support provided.  Goals of Care 08/25/23:Socially, she discusses her relationship with her daughters, one of whom is not speaking to her due to financial disagreements, causing concern about missing time with her grandchildren. Her other daughter has recently returned from [place], and her 69 year old granddaughter is interested in joining the [employer] which she speaks of how proud she is of her decisions.   Aaliya is realistic in her understanding expressing she knows that each day is a gift. She is appreciative of her daughter being involved in her life as they missed more than 7 years of life and togetherness due to being in separate states. The patient states she will continue to make new memories and enjoy every moment with her family. Continues to remain hopeful for stability relying on her faith and dedication to treatment to allow her every opportunity to continue thriving with her quality of life as a focus.   03/31/23- We discussed her current illness and what it means in the larger context of her on-going co-morbidities. Natural disease trajectory and expectations were discussed.   Ms. Cutsforth is realistic in her understanding.  Verbalize goal is to continue to treat the treatable allow her every opportunity to continue to thrive.  Is taking things oneday at a time.  We discussed Her current illness and what it means in the larger context of Her on-going co-morbidities. Natural disease trajectory and expectations were discussed.  I discussed the importance of continued conversation with family and their medical providers regarding overall plan of care and treatment  options, ensuring decisions are within the context of the patients values and GOCs.  Assessment and Plan  Chronic Cancer Related Pain Stable pain controlled with Oxycodone 10mg  and Pregabalin 150mg  TID. No new complaints or changes in bowel habits. -Refill Oxycodone 10mg  every 4-6 hours as needed.  -Pregabalin 150mg  TID  --Continue  Duloxetine 60mg  BID.  Muscle Spasms Despite maximum doses of Tizanidine and Pregabalin, patient still experiences severe muscle spasms, particularly at night. Discussed the possibility of tolerance to Tizanidine. -Switch Tizanidine to Flexeril (Cyclobenzaprine) 10mg  three times daily as needed. Monitor for effectiveness and tolerability.  I will plan to see patient back in 3-4 weeks. Sooner if needed.  Patient expressed understanding and was in agreement with this plan. She also understands that She can call the clinic at any time with any questions, concerns, or complaints.   Any controlled substances utilized were prescribed in the context of palliative care. PDMP has been reviewed.   Visit consisted of counseling and education dealing with the complex and emotionally intense issues of symptom management and palliative care in the setting of serious and potentially life-threatening illness.  Willette Alma, AGPCNP-BC  Palliative Medicine Team/[facility]

## 2023-09-21 ENCOUNTER — Inpatient Hospital Stay: Payer: Medicaid Other | Attending: Oncology | Admitting: Nurse Practitioner

## 2023-09-21 ENCOUNTER — Other Ambulatory Visit: Payer: Self-pay

## 2023-09-21 ENCOUNTER — Ambulatory Visit
Admission: RE | Admit: 2023-09-21 | Discharge: 2023-09-21 | Disposition: A | Discharge status: Home / Self Care | Payer: Medicaid Other | Source: Ambulatory Visit | Attending: Radiation Oncology | Admitting: Radiation Oncology

## 2023-09-21 ENCOUNTER — Encounter: Payer: Self-pay | Admitting: Nurse Practitioner

## 2023-09-21 VITALS — BP 141/75 | HR 84 | Temp 97.6°F | Resp 17 | Wt 166.0 lb

## 2023-09-21 DIAGNOSIS — M792 Neuralgia and neuritis, unspecified: Secondary | ICD-10-CM

## 2023-09-21 DIAGNOSIS — Z515 Encounter for palliative care: Secondary | ICD-10-CM | POA: Diagnosis not present

## 2023-09-21 DIAGNOSIS — C20 Malignant neoplasm of rectum: Secondary | ICD-10-CM | POA: Diagnosis not present

## 2023-09-21 DIAGNOSIS — R530 Neoplastic (malignant) related fatigue: Secondary | ICD-10-CM

## 2023-09-21 DIAGNOSIS — G893 Neoplasm related pain (acute) (chronic): Secondary | ICD-10-CM

## 2023-09-21 DIAGNOSIS — Z510 Encounter for antineoplastic radiation therapy: Secondary | ICD-10-CM | POA: Diagnosis not present

## 2023-09-21 LAB — RAD ONC ARIA SESSION SUMMARY
Course Elapsed Days: 58
Plan Fractions Treated to Date: 4
Plan Prescribed Dose Per Fraction: 10 Gy
Plan Total Fractions Prescribed: 5
Plan Total Prescribed Dose: 50 Gy
Reference Point Dosage Given to Date: 40 Gy
Reference Point Session Dosage Given: 10 Gy
Session Number: 32

## 2023-09-21 MED ORDER — PREGABALIN 150 MG PO CAPS
150.0000 mg | ORAL_CAPSULE | Freq: Three times a day (TID) | ORAL | 2 refills | Status: DC
Start: 2023-09-21 — End: 2024-01-05

## 2023-09-21 MED ORDER — OXYCODONE HCL 10 MG PO TABS
10.0000 mg | ORAL_TABLET | Freq: Three times a day (TID) | ORAL | 0 refills | Status: DC | PRN
Start: 2023-09-21 — End: 2023-10-27

## 2023-09-21 MED ORDER — CYCLOBENZAPRINE HCL 10 MG PO TABS: 10.0000 mg | ORAL_TABLET | Freq: Three times a day (TID) | ORAL | 2 refills | Status: DC | PRN

## 2023-09-23 ENCOUNTER — Ambulatory Visit
Admission: RE | Admit: 2023-09-23 | Discharge: 2023-09-23 | Disposition: A | Discharge status: Home / Self Care | Payer: Medicaid Other | Source: Ambulatory Visit | Attending: Radiation Oncology | Admitting: Radiation Oncology

## 2023-09-23 ENCOUNTER — Other Ambulatory Visit: Payer: Self-pay

## 2023-09-23 ENCOUNTER — Ambulatory Visit
Admission: RE | Admit: 2023-09-23 | Discharge: 2023-09-23 | Disposition: A | Discharge status: Home / Self Care | Source: Ambulatory Visit | Attending: Radiation Oncology | Admitting: Radiation Oncology

## 2023-09-23 DIAGNOSIS — Z510 Encounter for antineoplastic radiation therapy: Secondary | ICD-10-CM | POA: Diagnosis not present

## 2023-09-23 LAB — RAD ONC ARIA SESSION SUMMARY
Course Elapsed Days: 60
Plan Fractions Treated to Date: 5
Plan Prescribed Dose Per Fraction: 10 Gy
Plan Total Fractions Prescribed: 5
Plan Total Prescribed Dose: 50 Gy
Reference Point Dosage Given to Date: 50 Gy
Reference Point Session Dosage Given: 10 Gy
Session Number: 33

## 2023-09-26 ENCOUNTER — Telehealth: Payer: Self-pay

## 2023-09-26 NOTE — Telephone Encounter (Signed)
 Pt called reporting medication needed a PA, PA completed and verified with dispensing pharmacy. Pt updated, no further needs at this time.

## 2023-09-26 NOTE — Radiation Completion Notes (Signed)
  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   Intent: Curative     ==========DELIVERED PLANS==========  First Treatment Date: 2023-07-25 Last Treatment Date: 2023-09-23   Plan Name: Rectum Site: Rectum Technique: 3D Mode: Photon Dose Per Fraction: 1.8 Gy Prescribed Dose (Delivered / Prescribed): 45 Gy / 45 Gy Prescribed Fxs (Delivered / Prescribed): 25 / 25   Plan Name: Rectum_Bst Site: Rectum Technique: 3D Mode: Photon Dose Per Fraction: 1.8 Gy Prescribed Dose (Delivered / Prescribed): 5.4 Gy / 5.4 Gy Prescribed Fxs (Delivered / Prescribed): 3 / 3   Plan Name: Lung_L_SBRT Site: Lung, Left Technique: SBRT/SRT-IMRT Mode: Photon Dose Per Fraction: 10 Gy Prescribed Dose (Delivered / Prescribed): 50 Gy / 50 Gy Prescribed Fxs (Delivered / Prescribed): 5 / 5     ==========ON TREATMENT VISIT DATES========== 2023-07-29, 2023-08-16, 2023-08-19, 2023-08-26, 2023-09-02, 2023-09-13, 2023-09-14, 2023-09-16, 2023-09-19, 2023-09-21, 2023-09-23, 2023-09-23  The patient tolerated radiation. She developed fatigue and anticipated skin changes in the treatment field.   The patient will receive a call in about one month from the radiation oncology department. She will continue follow up with Dr. Mosetta Putt as well.      Osker Mason, [facility]  ES==========   See weekly On Treatment Notes in Epic for details in the Media tab (listed as Progress notes on the On Treatment Visit Dates listed above).

## 2023-09-29 ENCOUNTER — Inpatient Hospital Stay: Payer: Medicaid Other

## 2023-09-29 ENCOUNTER — Telehealth: Payer: Self-pay | Admitting: Hematology

## 2023-09-29 ENCOUNTER — Ambulatory Visit (HOSPITAL_COMMUNITY): Payer: Medicaid Other

## 2023-09-29 NOTE — Telephone Encounter (Signed)
 Patient called in and called her appointments for 09/29/2023 and also 10/06/2023 due to being sick currently; patient is aware to cancel their scan and they will reschedule for a later date, patient is aware to callback once she has her scan reschedule for her follow up appointment; patient has direct contact once the scan has been rescheduled. Message has also been forwarded to desk nurse and provider

## 2023-10-06 ENCOUNTER — Inpatient Hospital Stay: Payer: Medicaid Other | Admitting: Hematology

## 2023-10-10 ENCOUNTER — Inpatient Hospital Stay: Payer: Medicaid Other | Admitting: Genetic Counselor

## 2023-10-10 ENCOUNTER — Inpatient Hospital Stay: Admitting: Nurse Practitioner

## 2023-10-10 ENCOUNTER — Inpatient Hospital Stay: Payer: Medicaid Other

## 2023-10-14 ENCOUNTER — Telehealth: Payer: Self-pay

## 2023-10-14 ENCOUNTER — Inpatient Hospital Stay: Admitting: Nurse Practitioner

## 2023-10-14 NOTE — Telephone Encounter (Signed)
 Attempted to call pt to reschedule missed appt, no answer. LVM and callback number

## 2023-10-20 ENCOUNTER — Telehealth: Payer: Self-pay | Admitting: Nurse Practitioner

## 2023-10-20 NOTE — Telephone Encounter (Signed)
 Was unable to leave a voicemail. The patient is active on MyChart and will be mailed an appointment reminder.

## 2023-10-21 NOTE — Progress Notes (Signed)
  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 No voicemail available.  The patient has scheduled follow up with her medical oncologist Dr. Mosetta Putt for ongoing surveillance. She was encouraged to call if she develops concerns or questions regarding radiation.   Ruel Favors, LPN

## 2023-10-24 ENCOUNTER — Ambulatory Visit (HOSPITAL_COMMUNITY)

## 2023-10-24 ENCOUNTER — Ambulatory Visit
Admission: RE | Admit: 2023-10-24 | Discharge: 2023-10-24 | Disposition: A | Discharge status: Home / Self Care | Source: Ambulatory Visit | Attending: Internal Medicine | Admitting: Internal Medicine

## 2023-10-27 ENCOUNTER — Encounter: Payer: Self-pay | Admitting: Nurse Practitioner

## 2023-10-27 ENCOUNTER — Inpatient Hospital Stay: Attending: Oncology | Admitting: Nurse Practitioner

## 2023-10-27 VITALS — BP 113/60 | HR 77 | Temp 98.0°F | Ht 68.0 in | Wt 157.7 lb

## 2023-10-27 DIAGNOSIS — G893 Neoplasm related pain (acute) (chronic): Secondary | ICD-10-CM | POA: Diagnosis not present

## 2023-10-27 DIAGNOSIS — Z515 Encounter for palliative care: Secondary | ICD-10-CM | POA: Diagnosis not present

## 2023-10-27 DIAGNOSIS — R530 Neoplastic (malignant) related fatigue: Secondary | ICD-10-CM | POA: Diagnosis not present

## 2023-10-27 DIAGNOSIS — C20 Malignant neoplasm of rectum: Secondary | ICD-10-CM | POA: Diagnosis not present

## 2023-10-27 MED ORDER — OXYCODONE HCL 10 MG PO TABS: 10.0000 mg | ORAL_TABLET | Freq: Three times a day (TID) | ORAL | 0 refills | Status: DC | PRN

## 2023-10-27 MED ORDER — CYCLOBENZAPRINE HCL 10 MG PO TABS: 10.0000 mg | ORAL_TABLET | Freq: Three times a day (TID) | ORAL | 2 refills | Status: DC | PRN

## 2023-10-27 NOTE — Progress Notes (Signed)
 Palliative Medicine [facility]  [phone] [phone]   Name: [name] Date: 10/27/2023 MRN: [id number]  DOB: 10-16-1965  Patient Care Team: System, Provider Not In as PCP - General Gollan, Tollie Pizza, MD as PCP - Cardiology (Cardiology) Delma Freeze, FNP as Nurse Practitioner (Family Medicine) Antonieta Iba, MD as Consulting Physician (Cardiology) Merwyn Katos, MD (Inactive) as Consulting Physician (Pulmonary Disease) Jim Like, RN as Registered Nurse Scarlett Presto, RN (Inactive) as Registered Nurse Benita Gutter, RN as Oncology Nurse Navigator Malachy Mood, MD as Consulting Physician (Oncology) Pickenpack-Cousar, Arty Baumgartner, NP as Nurse Practitioner (Hospice and Palliative Medicine) Dorothy Puffer, MD as Consulting Physician (Radiation Oncology) Wyline Mood, MD as Consulting Physician (Gastroenterology) Raechel Chute, MD as Consulting Physician (Pulmonary Disease)    INTERVAL HISTORY: Erica [name] is a 58 y.o. 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 Palliative ask to see for symptom management and goals of care.   SOCIAL HISTORY:     reports that she has been smoking cigarettes. She has a 138 pack-year smoking history. She has never used smokeless tobacco. She reports current alcohol use. She reports that she does not use drugs.  ADVANCE DIRECTIVES:  None on file   CODE STATUS: Full code  PAST MEDICAL HISTORY: Past Medical History:  Diagnosis Date   (HFpEF) heart failure with preserved ejection fraction (HCC)    a. 05/2015 Echo: EF 60-65%, no rwma, PASP .   Acute pancreatitis 08/13/2018   Anxiety    Arthritis    Asthma    Bell's palsy    no deficit   Bronchitis    Cancer (HCC) 12/2022   rectal cancer   CHF (congestive heart failure) (HCC)    COPD (chronic obstructive pulmonary  disease) (HCC)    Depression    Diabetes mellitus without complication (HCC)    type II 03/2017   Dyspnea    Fatty liver    per patient   Gall stones    per patient   Gastric ulcer    Hyperlipidemia    Hypertension    Lung nodule 12/2022   upper left   OSA (obstructive sleep apnea)    a. did not tolerate CPAP. On oxygen 2L via [place]   Pancreatitis    07/2018   Shoulder injury    6/19   Smoker     ALLERGIES:  is allergic to tramadol and penicillins.  MEDICATIONS:  Current Outpatient Medications  Medication Sig Dispense Refill   albuterol (VENTOLIN HFA) 108 (90 Base) MCG/ACT inhaler Inhale 2 puffs into the lungs every 6 (six) hours as needed for wheezing or shortness of breath.      amoxicillin-clavulanate (AUGMENTIN) 875-125 MG tablet Take 1 tablet by mouth 2 (two) times daily. 14 tablet 0   Blood Glucose Monitoring Suppl (ACCU-CHEK AVIVA PLUS) w/Device KIT See admin instructions.  0   busPIRone (BUSPAR) 30 MG tablet Take 30 mg by mouth 2 (two) times daily. PER PT STATES HER DOSE WAS INCREASED     capecitabine (XELODA) 500 MG tablet Take 3 tablets (1,500 mg total) by mouth 2 (two) times daily after a meal. Take on days of radiation only, Monday through Friday 90 tablet 1   clonazePAM (KLONOPIN) 0.5 MG tablet Take 0.5 mg by mouth at bedtime as needed for anxiety.     cyclobenzaprine (FLEXERIL) 10 MG tablet Take 1 tablet (  10 mg total) by mouth 3 (three) times daily as needed for muscle spasms. 90 tablet 2   diltiazem (CARDIZEM CD) 120 MG 24 hr capsule TAKE 1 CAPSULE BY MOUTH EVERY DAY 180 capsule 2   diltiazem (CARDIZEM) 30 MG tablet TAKE 1 TABLET(30 MG) BY MOUTH THREE TIMES DAILY AS NEEDED 90 tablet 3   DULoxetine (CYMBALTA) 60 MG capsule Take 60 mg by mouth 2 (two) times daily.      ezetimibe (ZETIA) 10 MG tablet Take 10 mg by mouth at bedtime.     fluconazole (DIFLUCAN) 100 MG tablet Take 1 tablet (100 mg total) by mouth daily. For antibiotics induced vaginitis 2 tablet 0    Fluticasone-Salmeterol (ADVAIR) 250-50 MCG/DOSE AEPB Inhale 1 puff into the lungs 2 (two) times daily.     furosemide (LASIX) 40 MG tablet TAKE 1 TABLET BY MOUTH EVERY DAY. MAY TAKE ADDITIONAL TABLET IN THE AM FOR WEIGHT GAIN AS NEEDED 110 tablet 2   JARDIANCE 25 MG TABS tablet Take 25 mg by mouth daily.     lidocaine-prilocaine (EMLA) cream Apply to affected area once 30 g 3   magic mouthwash (nystatin, lidocaine, diphenhydrAMINE, alum & mag hydroxide) suspension Take 5 mLs by mouth 4 (four) times daily as needed for mouth pain. 140 mL 1   metFORMIN (GLUCOPHAGE-XR) 500 MG 24 hr tablet Take 1,000 mg by mouth 2 (two) times daily.     ondansetron (ZOFRAN) 8 MG tablet Take 1 tablet (8 mg total) by mouth every 8 (eight) hours as needed for nausea, vomiting or refractory nausea / vomiting. Start on the third day after chemotherapy. 30 tablet 1   Oxycodone HCl 10 MG TABS Take 1 tablet (10 mg total) by mouth 3 (three) times daily as needed. 90 tablet 0   OXYGEN Inhale 2 L into the lungs at bedtime. Or as needed throughout the day if short of breath     pantoprazole (PROTONIX) 40 MG tablet Take 1 tablet (40 mg total) by mouth daily. 30 tablet 0   potassium chloride SA (KLOR-CON) 20 MEQ tablet Take 20 mEq by mouth 2 (two) times daily.     pregabalin (LYRICA) 150 MG capsule Take 1 capsule (150 mg total) by mouth 3 (three) times daily. 90 capsule 2   prochlorperazine (COMPAZINE) 10 MG tablet Take 1 tablet (10 mg total) by mouth every 6 (six) hours as needed for nausea or vomiting. 30 tablet 1   spironolactone (ALDACTONE) 25 MG tablet TAKE 1 TABLET(25 MG) BY MOUTH EVERY MORNING 90 tablet 3   sucralfate (CARAFATE) 1 G tablet Take 1 tablet (1 g total) by mouth 4 (four) times daily -  with meals and at bedtime. (Patient taking differently: Take 1 g by mouth 2 (two) times daily.) 120 tablet 0   tiotropium (SPIRIVA) 18 MCG inhalation capsule Place 18 mcg into inhaler and inhale daily.     Varenicline Tartrate,  Starter, 0.5 MG X 11 & 1 MG X 42 TBPK See admin instructions. follow package directions     zolpidem (AMBIEN) 10 MG tablet Take 10 mg by mouth at bedtime as needed for sleep.     No current facility-administered medications for this visit.    VITAL SIGNS: BP 113/60 (BP Location: Left Arm, Patient Position: Sitting)   Pulse 77   Temp 98 F (36.7 C) (Temporal)   Ht 5\' 8"  (1.727 m)   Wt 157 lb 11.2 oz (71.5 kg)   LMP 03/13/2015 (Approximate) Comment: more than 2 years ago  SpO2 99%   BMI 23.98 kg/m  Filed Weights   10/27/23 1326  Weight: 157 lb 11.2 oz (71.5 kg)    Estimated body mass index is 23.98 kg/m as calculated from the following:   Height as of this encounter: 5\' 8"  (1.727 m).   Weight as of this encounter: 157 lb 11.2 oz (71.5 kg).   PERFORMANCE STATUS (ECOG) : 1 - Symptomatic but completely ambulatory  Physical Exam General: NAD Cardiovascular: regular rate and rhythm Pulmonary: normal breathing pattern Extremities: no edema, no joint deformities Skin: no rashes Neurological: AAO x3  IMPRESSION: Discussed the use of AI scribe software for clinical note transcription with the patient, who gave verbal consent to proceed.  History of Present Illness CARON ODE is a 58 year old female who presents for follow-up on pain management and medication refills. No acute distress. Gastrointestinal symptoms have improved, with no recent nausea or vomiting, which she attributes to completion of chemoradiation treatment. She also notes improvement in bowel movements.Occasional fatigue. Tries to remain as active as possible.   She is trying to keep her blood sugar levels under control despite dietary challenges. Her appetite has improved significantly, although it is not consistent every day. She notes a weight decrease from 166 pounds on March 5th to 157 pounds today. She attributes fluctuations in her appetite to the type of food she consumes, mentioning that some days she  prefers lighter foods like yogurt, while other days she opts for heavier meals like tacos.  Pain is controlled on current regimen. She has been taking oxycodone as needed, but ran out a couple of days ago. Flexeril 10 mg three times a day as needed.  Lyrica (pregabalin) 150 mg three times a day, although she feels it is not as effective as it used to be. She cannot take gabapentin due to adverse effects, including blackouts. No adjustments to current regimen. Will continue to closely monitor.   All questions answered and support provided.   I discussed the importance of continued conversation with family and their medical providers regarding overall plan of care and treatment options, ensuring decisions are within the context of the patients values and GOCs. Assessment & Plan Cancer Related Pain management Chronic pain persists, managed with oxycodone and Flexeril. Lyrica at maximum dose. Gabapentin not tolerated.  No adjustments to current regimen. - Send prescriptions for oxycodone and Flexeril to [facility] in [place]. - Continue Lyrica 150 mg three times a day. -Continue oxycodone 10 mg every 8 hours as needed. - Flexeril 10 mg 3 times daily as needed  Diabetes mellitus Long-standing diabetes managed with dietary adjustments. She is aware of dietary restrictions but prioritizes weight and quality of life.  Goals of Care She expressed concerns about family dynamics and desires to maintain relationships with grandchildren. Communicated importance of resolving issues with her daughter with understanding her cancer is incurable.  Follow-up Next appointment scheduled for May 8th.  - Confirm next appointment on May 8th. - Instruct her to call if any changes occur before the next appointment.  Patient expressed understanding and was in agreement with this plan. She also understands that She can call the clinic at any time with any questions, concerns, or complaints.   Any controlled  substances utilized were prescribed in the context of palliative care. PDMP has been reviewed.   Visit consisted of counseling and education dealing with the complex and emotionally intense issues of symptom management and palliative care in the setting of serious and potentially life-threatening  illness.  Willette Alma, AGPCNP-BC  Palliative Medicine Team/[facility]

## 2023-11-01 ENCOUNTER — Ambulatory Visit (HOSPITAL_COMMUNITY)
Admission: RE | Admit: 2023-11-01 | Discharge: 2023-11-01 | Disposition: A | Discharge status: Home / Self Care | Source: Ambulatory Visit | Attending: Hematology | Admitting: Hematology

## 2023-11-01 DIAGNOSIS — C20 Malignant neoplasm of rectum: Secondary | ICD-10-CM | POA: Diagnosis present

## 2023-11-01 MED ORDER — IOHEXOL 300 MG/ML  SOLN
100.0000 mL | Freq: Once | INTRAMUSCULAR | Status: AC | PRN
  Administered 2023-11-01: 100 mL via INTRAVENOUS

## 2023-11-01 MED ORDER — HEPARIN SOD (PORK) LOCK FLUSH 100 UNIT/ML IV SOLN
500.0000 [IU] | Freq: Once | INTRAVENOUS | Status: AC
  Administered 2023-11-01: 100 [IU] via INTRAVENOUS

## 2023-11-01 MED ORDER — IOHEXOL 9 MG/ML PO SOLN
1000.0000 mL | ORAL | Status: AC
  Administered 2023-11-01: 1000 mL via ORAL

## 2023-11-01 MED ORDER — IOHEXOL 9 MG/ML PO SOLN
ORAL | Status: AC
  Filled 2023-11-01: qty 1000

## 2023-11-07 ENCOUNTER — Telehealth: Payer: Self-pay | Admitting: Hematology

## 2023-11-07 ENCOUNTER — Telehealth: Payer: Self-pay | Admitting: *Deleted

## 2023-11-07 NOTE — Telephone Encounter (Signed)
RETURNED PATIENT'S PHONE CALL, UNABLE TO LVM DUE TO VM NOT BEING SET UP

## 2023-11-07 NOTE — Telephone Encounter (Signed)
 Scheduled appointment per the patients request. The patient is aware of the appointment details.

## 2023-11-17 ENCOUNTER — Encounter: Payer: Self-pay | Admitting: Hematology

## 2023-11-17 ENCOUNTER — Inpatient Hospital Stay: Attending: Oncology | Admitting: Hematology

## 2023-11-17 VITALS — BP 117/70 | HR 89 | Temp 98.0°F | Resp 16 | Ht 68.0 in | Wt 170.8 lb

## 2023-11-17 DIAGNOSIS — Z85048 Personal history of other malignant neoplasm of rectum, rectosigmoid junction, and anus: Secondary | ICD-10-CM | POA: Diagnosis present

## 2023-11-17 DIAGNOSIS — G629 Polyneuropathy, unspecified: Secondary | ICD-10-CM | POA: Insufficient documentation

## 2023-11-17 DIAGNOSIS — C20 Malignant neoplasm of rectum: Secondary | ICD-10-CM | POA: Diagnosis not present

## 2023-11-17 DIAGNOSIS — Z9221 Personal history of antineoplastic chemotherapy: Secondary | ICD-10-CM | POA: Insufficient documentation

## 2023-11-17 DIAGNOSIS — R918 Other nonspecific abnormal finding of lung field: Secondary | ICD-10-CM | POA: Insufficient documentation

## 2023-11-17 DIAGNOSIS — Z923 Personal history of irradiation: Secondary | ICD-10-CM | POA: Insufficient documentation

## 2023-11-17 DIAGNOSIS — F17210 Nicotine dependence, cigarettes, uncomplicated: Secondary | ICD-10-CM | POA: Diagnosis not present

## 2023-11-17 DIAGNOSIS — R059 Cough, unspecified: Secondary | ICD-10-CM | POA: Diagnosis not present

## 2023-11-17 DIAGNOSIS — K760 Fatty (change of) liver, not elsewhere classified: Secondary | ICD-10-CM | POA: Insufficient documentation

## 2023-11-17 NOTE — Assessment & Plan Note (Addendum)
 [id number], stage IV,with oligo lung metastasis, intact MMR, KRAS G12V (+) -We reviewed her medical record and workup in detail with the patient and her daughter.  She presented with hematochezia since 05/2022 -Workup showed a near obstructing rectal mass. Path confirmed adenocarcinoma. Baseline CEA elevated to 18.1.  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  I added bevacizumab  to her chemo from cycle 3 -restaging CT 11/9 showed good PR, she completed a total of 8 cycle chemo then started concurrent chemoRT to rectal cancer on 07/25/2023 and completed on 09/23/2023 -CT CAP w contrast on 11/01/2023 showed rectal wall thickening but otherwise no evidence of residual diease

## 2023-11-17 NOTE — Progress Notes (Addendum)
 [facility]   Telephone:([phone]) [phone] [phone]) [phone]   Clinic Follow up Note   Patient Care Team: System, Provider Not In as PCP - General Gollan, Evalene PARAS, MD as PCP - Cardiology (Cardiology) Donette Ellouise LABOR, FNP as Nurse Practitioner (Family Medicine) Perla, Evalene PARAS, MD as Consulting Physician (Cardiology) Linard Alm NOVAK, MD (Inactive) as Consulting Physician (Pulmonary Disease) Cindie Jesusa HERO, RN as Registered Nurse Dannielle Arlean FALCON, RN (Inactive) as Registered Nurse Maurie Rayfield BIRCH, RN as Oncology Nurse Navigator Lanny Callander, MD as Consulting Physician (Oncology) Pickenpack-Cousar, Fannie SAILOR, NP as Nurse Practitioner (Hospice and Palliative Medicine) Dewey Rush, MD as Consulting Physician (Radiation Oncology) Therisa Bi, MD as Consulting Physician (Gastroenterology) Isadora Hose, MD as Consulting Physician (Pulmonary Disease)  Date of Service:  11/17/2023  CHIEF COMPLAINT: f/u of rectal cancer  CURRENT THERAPY:  Observation  Oncology History   Rectal cancer (HCC) cT3cN2M1, stage IV,with oligo lung metastasis, intact MMR, KRAS G12V (+) -We reviewed her medical record and workup in detail with the patient and her daughter.  She presented with hematochezia since 05/2022 -Workup showed a near obstructing rectal mass. Path confirmed adenocarcinoma. Baseline CEA elevated to 18.1.  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  I added bevacizumab  to her chemo from cycle 3 -restaging CT 11/9 showed good PR, she completed a total of 8 cycle chemo then started concurrent chemoRT to rectal cancer on 07/25/2023 and completed on 09/23/2023 -CT CAP w contrast on  11/01/2023 showed rectal wall thickening but otherwise no evidence of residual diease   Assessment & Plan Metastatic rectal cancer Metastatic rectal cancer with previous lung metastasis. Good response to chemoradiation with significant reduction in lung mass, now nearly resolved. Asymmetric rectal wall thickening on recent CT likely post-treatment changes, but residual cancer cannot be excluded without further evaluation. No abdominal cancer recurrence.  High recurrence risk due to prior metastasis. Prognosis: 20-30% chance of cure, >50% chance of recurrence. Surgery is the only curative option if residual cancer persists post-radiation, potentially requiring an ostomy. Chemotherapy is an alternative if surgery is not pursued. - Schedule surgical evaluation with Dr. Teresa. - Perform circulating tumor DNA test in 5-6 weeks during port flush. - Conduct CT scan every 3 months to monitor for recurrence. - Perform blood test for circulating tumor DNA every 3 months. - Consider surgery if residual cancer is present post-radiation. - Discuss potential ostomy with Dr. Teresa if surgery is required.  Peripheral neuropathy Pre-existing peripheral neuropathy, present before chemotherapy. No new symptoms.  Fatty liver Fatty liver noted on imaging.  Nicotine  dependence Continued smoking despite previous cessation advice. Smoking may contribute to lung nodules and overall health risks. - Advise smoking cessation to reduce health risks and improve treatment outcomes.  Cough Persistent cough, possibly related to smoking. No acute respiratory distress. - Advise follow-up if cough worsens or persists.  Plan - Lab and restaging CT scan reviewed, she has had near complete response. - I recommend follow-up with Dr. Teresa for endoscopic exam in the next few months - Will check Signatera in 6 weeks - Follow-up in 3 months with lab, flush and CT scan a week before   SUMMARY OF ONCOLOGIC HISTORY: Oncology  History Overview Note  Cancer Staging  Rectal cancer [facility]) Staging form: Colon and Rectum, AJCC 8th Edition - Clinical stage from 12/23/2022: Stage IVA (cT3, cN2, pM1a) - Signed by Babara Call, MD on 02/04/2023 Stage prefix: Initial diagnosis     Rectal cancer (HCC)  12/15/2022 Pathology Results   Colonoscopy:   Positive for invasive adenocarcinoma, G1 well differentiated      12/15/2022 Procedure   Colonoscopy under the care of Dr. Ruel   Findings: -The perianal and digital rectal examinations were normal. -Extensive amounts of liquid semi- liquid semi- solid stool was found in the entire colon -A partially obstructing large mass was found in the rectum. The mass was circumferential.   12/23/2022 Initial Diagnosis   Rectal cancer [facility]) Patient was seen by gastroenterology due to hematochezia as well as unintentional weight loss.  12/15/2022, colonoscopy showed fungating partially obstructing large mass in the rectum, circumferential, mass extended from about 15 cm from anus to about 8 cm from anal verge.  Biopsy was taken. Poor preparation of colon.  Colonoscopy was repeated on 12/16/2022, preparations inadequate.  Pathology showed invasive adenocarcinoma.   12/23/2022 Cancer Staging   Staging form: Colon and Rectum, AJCC 8th Edition - Clinical stage from 12/23/2022: Stage Unknown (cTX, cNX) - Signed by Babara Call, MD on 12/23/2022 Stage prefix: Initial diagnosis  Staging form: Colon and Rectum, AJCC 8th Edition - Clinical stage from 12/23/2022: Stage IVA (cT3, cN2, pM1a) - Signed by Babara Call, MD on 02/04/2023 Stage prefix: Initial diagnosis   12/23/2022 Tumor Marker   Patient's tumor was tested for the following markers: CEA. Results of the tumor marker test revealed elevated at 18.1.   01/03/2023 Imaging   CT chest abdomen with contrast showed 2.2 cm left upper lobe nodule, suspicious for metastasis. No findings suspicious for metastatic disease in the abdomen. Bilateral adrenal  adenomas, benign. Cholelithiasis, without associated inflammatory changes. Aortic Atherosclerosis (ICD10-I70.0) and Emphysema   01/08/2023 Imaging   MRI pelvis without contrast-rectal protocol showed 9.2 cm concentric mid/lower rectal mass, corresponding to the patient's known primary rectal adenocarcinoma, as above. Rectal adenocarcinoma T stage: T3c Rectal adenocarcinoma N stage:  N2 Distance from tumor to the internal anal sphincter is 3.4 cm.   01/27/2023 Procedure   Patient underwent biopsy via bronchoscopy by pulmonology. Biopsy pathology showed adenocarcinoma, Immunohistochemistry is positive with cytokeratin 20 and CDX2 and negative with cytokeratin 7, TTF-1 and napsin A consistent with colonic adenocarcinoma    01/27/2023 Pathology Results   CASE: [phone]   FINAL MICROSCOPIC DIAGNOSIS:  A. LUNG, LUL, FINE NEEDLE ASPIRATION:  Adenocarcinoma  See comment   ADDENDUM:  Immunohistochemistry is positive with cytokeratin 20 and CDX2  and negative with cytokeratin 7, TTF-1 and napsin A consistent with  colonic adenocarcinoma.    02/28/2023 Miscellaneous      03/03/2023 -  Chemotherapy   Patient is on Treatment Plan : COLORECTAL FOLFIRI q14d     05/13/2023 Imaging   CT chest, abdomen, and pelvis with contrast IMPRESSION: 1. Response to therapy of rectal primary, pelvic nodal, and isolated pulmonary metastasis. 2. Moderate amount of stool upstream from the rectal primary. Partial obstruction cannot be excluded. 3. No new or progressive disease. 4. Right Port-A-Cath with anterior SVC pericatheter small volume thrombus. 5. Age advanced coronary artery atherosclerosis. Recommend assessment of coronary risk factors. 6. Incidental findings, including: Cholelithiasis. Aortic Atherosclerosis (ICD10-I70.0).      Discussed the use of AI scribe software for clinical note transcription with the patient, who gave verbal consent to proceed.  History  of Present Illness Erica [name] is a 58 year old female with rectal cancer who presents for follow-up.  She is recovering well from chemoradiation with no nausea, vomiting, or residual neuropathy. Her weight has increased to 170 pounds from 157-166 pounds. Bowel movements have returned to normal with no blood present.  She has a persistent cough that worsens once it starts and continues to smoke despite attempts to quit. Metastatic cancer in her lung has significantly shrunk, leaving only a small scar.  Recent imaging shows stable asymmetric wall thickening along the rectum with no signs of cancer recurrence. Small lymph nodes in the mediastinum are slightly larger but remain within normal limits. Fatty liver and small lung nodules are present but not currently considered cancerous.     All other systems were reviewed with the patient and are negative.  MEDICAL HISTORY:  Past Medical History:  Diagnosis Date   (HFpEF) heart failure with preserved ejection fraction (HCC)    a. 05/2015 Echo: EF 60-65%, no rwma, PASP .   Acute pancreatitis 08/13/2018   Anxiety    Arthritis    Asthma    Bell's palsy    no deficit   Bronchitis    Cancer (HCC) 12/2022   rectal cancer   CHF (congestive heart failure) (HCC)    COPD (chronic obstructive pulmonary disease) (HCC)    Depression    Diabetes mellitus without complication (HCC)    type II 03/2017   Dyspnea    Fatty liver    per patient   Gall stones    per patient   Gastric ulcer    Hyperlipidemia    Hypertension    Lung nodule 12/2022   upper left   OSA (obstructive sleep apnea)    a. did not tolerate CPAP. On oxygen 2L via [place]   Pancreatitis    07/2018   Shoulder injury    6/19   Smoker     SURGICAL HISTORY: Past Surgical History:  Procedure Laterality Date   BRONCHIAL NEEDLE ASPIRATION BIOPSY  01/27/2023   Procedure: BRONCHIAL NEEDLE ASPIRATION BIOPSIES;  Surgeon: Isadora Hose, MD;  Location: [facility] ENDOSCOPY;  Service: Pulmonary;;   COLONOSCOPY  WITH PROPOFOL  N/A 12/15/2022   Procedure: COLONOSCOPY WITH PROPOFOL ;  Surgeon: Therisa Bi, MD;  Location: [facility] ENDOSCOPY;  Service: Gastroenterology;  Laterality: N/A;   COLONOSCOPY WITH PROPOFOL  N/A 12/16/2022   Procedure: COLONOSCOPY WITH PROPOFOL ;  Surgeon: Therisa Bi, MD;  Location: [facility] ENDOSCOPY;  Service: Gastroenterology;  Laterality: N/A;   FLEXIBLE BRONCHOSCOPY N/A 06/20/2015   Procedure: FLEXIBLE BRONCHOSCOPY;  Surgeon: Laurance Flor, MD;  Location: [facility] ORS;  Service: Pulmonary;  Laterality: N/A;   IR IMAGING GUIDED PORT INSERTION  02/25/2023   KNEE SURGERY Left    8 knee surgeries    I have reviewed the social history and family history with the patient and they are unchanged from previous note.  ALLERGIES:  is allergic to tramadol and penicillins.  MEDICATIONS:  Current Outpatient Medications  Medication Sig Dispense Refill   albuterol  (VENTOLIN  HFA) 108 (90 Base) MCG/ACT inhaler Inhale 2 puffs into the lungs every 6 (six) hours as needed for wheezing or shortness of breath.      amoxicillin -clavulanate (AUGMENTIN ) 875-125 MG tablet Take 1 tablet by mouth 2 (two) times daily. 14 tablet 0   Blood Glucose Monitoring Suppl (ACCU-CHEK AVIVA PLUS) w/Device KIT See admin instructions.  0   busPIRone  (BUSPAR ) 30 MG tablet Take 30 mg by mouth 2 (two) times daily. PER PT  STATES HER DOSE WAS INCREASED     capecitabine  (XELODA ) 500 MG tablet Take 3 tablets (1,500 mg total) by mouth 2 (two) times daily after a meal. Take on days of radiation only, Monday through Friday 90 tablet 1   clonazePAM  (KLONOPIN ) 0.5 MG tablet Take 0.5 mg by mouth at bedtime as needed for anxiety.     cyclobenzaprine  (FLEXERIL ) 10 MG tablet Take 1 tablet (10 mg total) by mouth 3 (three) times daily as needed for muscle spasms. 90 tablet 2   diltiazem  (CARDIZEM  CD) 120 MG 24 hr capsule TAKE 1 CAPSULE BY MOUTH EVERY DAY 180 capsule 2   diltiazem  (CARDIZEM ) 30 MG tablet TAKE 1 TABLET(30 MG) BY MOUTH THREE TIMES  DAILY AS NEEDED 90 tablet 3   DULoxetine  (CYMBALTA ) 60 MG capsule Take 60 mg by mouth 2 (two) times daily.      ezetimibe  (ZETIA ) 10 MG tablet Take 10 mg by mouth at bedtime.     fluconazole  (DIFLUCAN ) 100 MG tablet Take 1 tablet (100 mg total) by mouth daily. For antibiotics induced vaginitis 2 tablet 0   Fluticasone -Salmeterol (ADVAIR) 250-50 MCG/DOSE AEPB Inhale 1 puff into the lungs 2 (two) times daily.     furosemide  (LASIX ) 40 MG tablet TAKE 1 TABLET BY MOUTH EVERY DAY. MAY TAKE ADDITIONAL TABLET IN THE AM FOR WEIGHT GAIN AS NEEDED 110 tablet 2   JARDIANCE 25 MG TABS tablet Take 25 mg by mouth daily.     lidocaine -prilocaine  (EMLA ) cream Apply to affected area once 30 g 3   magic mouthwash (nystatin , lidocaine , diphenhydrAMINE, alum & mag hydroxide) suspension Take 5 mLs by mouth 4 (four) times daily as needed for mouth pain. 140 mL 1   metFORMIN  (GLUCOPHAGE -XR) 500 MG 24 hr tablet Take 1,000 mg by mouth 2 (two) times daily.     ondansetron  (ZOFRAN ) 8 MG tablet Take 1 tablet (8 mg total) by mouth every 8 (eight) hours as needed for nausea, vomiting or refractory nausea / vomiting. Start on the third day after chemotherapy. 30 tablet 1   Oxycodone  HCl 10 MG TABS Take 1 tablet (10 mg total) by mouth 3 (three) times daily as needed. 90 tablet 0   OXYGEN Inhale 2 L into the lungs at bedtime. Or as needed throughout the day if short of breath     pantoprazole  (PROTONIX ) 40 MG tablet Take 1 tablet (40 mg total) by mouth daily. 30 tablet 0   potassium chloride  SA (KLOR-CON ) 20 MEQ tablet Take 20 mEq by mouth 2 (two) times daily.     pregabalin  (LYRICA ) 150 MG capsule Take 1 capsule (150 mg total) by mouth 3 (three) times daily. 90 capsule 2   prochlorperazine  (COMPAZINE ) 10 MG tablet Take 1 tablet (10 mg total) by mouth every 6 (six) hours as needed for nausea or vomiting. 30 tablet 1   spironolactone  (ALDACTONE ) 25 MG tablet TAKE 1 TABLET(25 MG) BY MOUTH EVERY MORNING 90 tablet 3   sucralfate   (CARAFATE ) 1 G tablet Take 1 tablet (1 g total) by mouth 4 (four) times daily -  with meals and at bedtime. (Patient taking differently: Take 1 g by mouth 2 (two) times daily.) 120 tablet 0   tiotropium (SPIRIVA ) 18 MCG inhalation capsule Place 18 mcg into inhaler and inhale daily.     Varenicline  Tartrate, Starter, 0.5 MG X 11 & 1 MG X 42 TBPK See admin instructions. follow package directions     zolpidem (AMBIEN) 10 MG tablet Take 10 mg by  mouth at bedtime as needed for sleep.     No current facility-administered medications for this visit.    PHYSICAL EXAMINATION: ECOG PERFORMANCE STATUS: 1 - Symptomatic but completely ambulatory  Vitals:   11/17/23 1035  BP: 117/70  Pulse: 89  Resp: 16  Temp: 98 F (36.7 C)  SpO2: 99%   Wt Readings from Last 3 Encounters:  11/17/23 170 lb 12.8 oz (77.5 kg)  10/27/23 157 lb 11.2 oz (71.5 kg)  09/21/23 166 lb (75.3 kg)     GENERAL:alert, no distress and comfortable SKIN: skin color, texture, turgor are normal, no rashes or significant lesions EYES: normal, Conjunctiva are pink and non-injected, sclera clear NECK: supple, thyroid normal size, non-tender, without nodularity LYMPH:  no palpable lymphadenopathy in the cervical, axillary  LUNGS: clear to auscultation and percussion with normal breathing effort HEART: regular rate & rhythm and no murmurs and no lower extremity edema ABDOMEN:abdomen soft, non-tender and normal bowel sounds Musculoskeletal:no cyanosis of digits and no clubbing  NEURO: alert & oriented x 3 with fluent speech, no focal motor/sensory deficits  Physical Exam MEASUREMENTS: Weight- 170.  LABORATORY DATA:  I have reviewed the data as listed    Latest Ref Rng & Units 09/08/2023   12:50 PM 08/25/2023    1:15 PM 07/28/2023   12:40 PM  CBC  WBC 4.0 - 10.5 K/uL 4.3  5.9  9.0   Hemoglobin 12.0 - 15.0 g/dL 86.5  86.0  86.6   Hematocrit 36.0 - 46.0 % 40.9  41.6  40.2   Platelets 150 - 400 K/uL 172  165  156          Latest Ref Rng & Units 09/08/2023   12:50 PM 08/25/2023    1:15 PM 07/28/2023   12:40 PM  CMP  Glucose 70 - 99 mg/dL 891  810  821   BUN 6 - 20 mg/dL 8  10  9    Creatinine 0.44 - 1.00 mg/dL 9.22  9.27  9.31   Sodium 135 - 145 mmol/L 136  136  136   Potassium 3.5 - 5.1 mmol/L 4.4  4.2  4.2   Chloride 98 - 111 mmol/L 102  103  102   CO2 22 - 32 mmol/L 29  29  28    Calcium  8.9 - 10.3 mg/dL 9.3  9.2  9.2   Total Protein 6.5 - 8.1 g/dL 6.5  6.7  6.6   Total Bilirubin 0.0 - 1.2 mg/dL 0.5  0.4  0.5   Alkaline Phos 38 - 126 U/L 59  78  88   AST 15 - 41 U/L 15  16  22    ALT 0 - 44 U/L 11  13  23        RADIOGRAPHIC STUDIES: I have personally reviewed the radiological images as listed and agreed with the findings in the report. No results found.    No orders of the defined types were placed in this encounter.  All questions were answered. The patient knows to call the clinic with any problems, questions or concerns. No barriers to learning was detected. The total time spent in the appointment was 30 minutes.     Onita Mattock, MD 11/17/2023

## 2023-11-18 ENCOUNTER — Other Ambulatory Visit: Payer: Self-pay

## 2023-11-18 DIAGNOSIS — C20 Malignant neoplasm of rectum: Secondary | ICD-10-CM

## 2023-11-18 NOTE — Progress Notes (Signed)
 As per Dr. Maryalice Smaller, placed order for Signatera in portal successfully. Sent required patient information with the order. Kit was taken to lab to be drawn 06/19.

## 2023-11-22 ENCOUNTER — Emergency Department (HOSPITAL_COMMUNITY)
Admission: EM | Admit: 2023-11-22 | Discharge: 2023-11-23 | Disposition: A | Discharge status: Home / Self Care | Attending: Emergency Medicine | Admitting: Emergency Medicine

## 2023-11-22 ENCOUNTER — Other Ambulatory Visit: Payer: Self-pay

## 2023-11-22 ENCOUNTER — Encounter (HOSPITAL_COMMUNITY): Payer: Self-pay | Admitting: Emergency Medicine

## 2023-11-22 ENCOUNTER — Emergency Department (HOSPITAL_COMMUNITY)

## 2023-11-22 DIAGNOSIS — J45909 Unspecified asthma, uncomplicated: Secondary | ICD-10-CM | POA: Insufficient documentation

## 2023-11-22 DIAGNOSIS — C20 Malignant neoplasm of rectum: Secondary | ICD-10-CM | POA: Diagnosis not present

## 2023-11-22 DIAGNOSIS — J449 Chronic obstructive pulmonary disease, unspecified: Secondary | ICD-10-CM | POA: Diagnosis not present

## 2023-11-22 DIAGNOSIS — Z79899 Other long term (current) drug therapy: Secondary | ICD-10-CM | POA: Diagnosis not present

## 2023-11-22 DIAGNOSIS — R1013 Epigastric pain: Secondary | ICD-10-CM | POA: Diagnosis present

## 2023-11-22 DIAGNOSIS — Z7902 Long term (current) use of antithrombotics/antiplatelets: Secondary | ICD-10-CM | POA: Insufficient documentation

## 2023-11-22 DIAGNOSIS — Z7951 Long term (current) use of inhaled steroids: Secondary | ICD-10-CM | POA: Diagnosis not present

## 2023-11-22 DIAGNOSIS — E119 Type 2 diabetes mellitus without complications: Secondary | ICD-10-CM | POA: Insufficient documentation

## 2023-11-22 DIAGNOSIS — K259 Gastric ulcer, unspecified as acute or chronic, without hemorrhage or perforation: Secondary | ICD-10-CM | POA: Diagnosis not present

## 2023-11-22 DIAGNOSIS — Z7984 Long term (current) use of oral hypoglycemic drugs: Secondary | ICD-10-CM | POA: Diagnosis not present

## 2023-11-22 DIAGNOSIS — I110 Hypertensive heart disease with heart failure: Secondary | ICD-10-CM | POA: Diagnosis not present

## 2023-11-22 DIAGNOSIS — I509 Heart failure, unspecified: Secondary | ICD-10-CM | POA: Diagnosis not present

## 2023-11-22 LAB — URINALYSIS, ROUTINE W REFLEX MICROSCOPIC
Bacteria, UA: NONE SEEN
Bilirubin Urine: NEGATIVE
Glucose, UA: >=500 mg/dL — AB
Hgb urine dipstick: NEGATIVE
Ketones, ur: 5 mg/dL — AB
Nitrite: NEGATIVE
Protein, ur: NEGATIVE mg/dL
Specific Gravity, Urine: 1.035 — ABNORMAL HIGH (ref 1.005–1.030)
pH: 5 (ref 5.0–8.0)

## 2023-11-22 LAB — COMPREHENSIVE METABOLIC PANEL WITH GFR
ALT: 19 U/L (ref 0–44)
AST: 27 U/L (ref 15–41)
Albumin: 3.6 g/dL (ref 3.5–5.0)
Alkaline Phosphatase: 95 U/L (ref 38–126)
Anion gap: 16 — ABNORMAL HIGH (ref 5–15)
BUN: 9 mg/dL (ref 6–20)
CO2: 21 mmol/L — ABNORMAL LOW (ref 22–32)
Calcium: 9.4 mg/dL (ref 8.9–10.3)
Chloride: 99 mmol/L (ref 98–111)
Creatinine, Ser: 0.65 mg/dL (ref 0.44–1.00)
GFR, Estimated: >60 mL/min (ref >60)
Glucose, Bld: 158 mg/dL — ABNORMAL HIGH (ref 70–99)
Potassium: 3.8 mmol/L (ref 3.5–5.1)
Sodium: 136 mmol/L (ref 135–145)
Total Bilirubin: 0.4 mg/dL (ref 0.0–1.2)
Total Protein: 7.7 g/dL (ref 6.5–8.1)

## 2023-11-22 LAB — CBC
HCT: 43.1 % (ref 36.0–46.0)
Hemoglobin: 13.5 g/dL (ref 12.0–15.0)
MCH: 32 pg (ref 26.0–34.0)
MCHC: 31.3 g/dL (ref 30.0–36.0)
MCV: 102.1 fL — ABNORMAL HIGH (ref 80.0–100.0)
Platelets: 231 10*3/uL (ref 150–400)
RBC: 4.22 MIL/uL (ref 3.87–5.11)
RDW: 17 % — ABNORMAL HIGH (ref 11.5–15.5)
WBC: 11.4 10*3/uL — ABNORMAL HIGH (ref 4.0–10.5)
nRBC: 0 % (ref 0.0–0.2)

## 2023-11-22 LAB — LIPASE, BLOOD: Lipase: 23 U/L (ref 11–51)

## 2023-11-22 MED ORDER — ALUM & MAG HYDROXIDE-SIMETH 200-200-20 MG/5ML PO SUSP
30.0000 mL | Freq: Once | ORAL | Status: AC
  Administered 2023-11-23: 30 mL via ORAL
  Filled 2023-11-22: qty 30

## 2023-11-22 MED ORDER — IOHEXOL 300 MG/ML  SOLN
100.0000 mL | Freq: Once | INTRAMUSCULAR | Status: AC | PRN
  Administered 2023-11-22: 100 mL via INTRAVENOUS

## 2023-11-22 MED ORDER — ACETAMINOPHEN 500 MG PO TABS
1000.0000 mg | ORAL_TABLET | Freq: Once | ORAL | Status: AC
  Administered 2023-11-23: 1000 mg via ORAL
  Filled 2023-11-22: qty 2

## 2023-11-22 MED ORDER — MORPHINE SULFATE (PF) 4 MG/ML IV SOLN
4.0000 mg | Freq: Once | INTRAVENOUS | Status: AC
  Administered 2023-11-22: 4 mg via INTRAVENOUS
  Filled 2023-11-22: qty 1

## 2023-11-22 MED ORDER — ONDANSETRON HCL 4 MG/2ML IJ SOLN
4.0000 mg | Freq: Once | INTRAMUSCULAR | Status: AC
  Administered 2023-11-22: 4 mg via INTRAVENOUS
  Filled 2023-11-22: qty 2

## 2023-11-22 MED ORDER — LIDOCAINE VISCOUS HCL 2 % MT SOLN
15.0000 mL | Freq: Once | OROMUCOSAL | Status: AC
  Administered 2023-11-23: 15 mL via ORAL
  Filled 2023-11-22: qty 15

## 2023-11-22 NOTE — ED Provider Notes (Signed)
 [place] EMERGENCY DEPARTMENT AT [facility] Provider Note   CSN: [id number] Arrival date & time: 11/22/23  1934     History {Add pertinent medical, surgical, social history, OB history to HPI:1} Chief Complaint  Patient presents with   Abdominal Pain    ZULMY BETTCHER is a 58 y.o. female with PMHx CHF, anxiety, OA, asthma, bell's palsy, COPD on 2L [place] at baseline, DM, HLD, HTN, chronic pain syndrome, metastatic rectal cancer currently on radiation who presents to ED concerned for epigastric abdominal pain  radiating to the back x1 day. Patient took 10mg  Oxy at home this morning which did help some with the pain. Last BM today.  Patient's last dose of chemo 09/25/2023. Patient currently only on radiation treatment with last treatment 6 days ago.  Denies fever, nausea, vomiting, diarrhea, dysuria, hematuria, hematochezia. Denies worsening of her chronic dyspnea.    Abdominal Pain      Home Medications Prior to Admission medications   Medication Sig Start Date End Date Taking? Authorizing Provider  albuterol  (VENTOLIN  HFA) 108 (90 Base) MCG/ACT inhaler Inhale 2 puffs into the lungs every 6 (six) hours as needed for wheezing or shortness of breath.     [provider]  amoxicillin -clavulanate (AUGMENTIN ) 875-125 MG tablet Take 1 tablet by mouth 2 (two) times daily. 09/08/23   Sonja [place], MD  Blood Glucose Monitoring Suppl (ACCU-CHEK AVIVA PLUS) w/Device KIT See admin instructions. 10/20/17   [provider]  busPIRone (BUSPAR) 30 MG tablet Take 30 mg by mouth 2 (two) times daily. PER PT STATES HER DOSE WAS INCREASED 02/14/20   [provider]  capecitabine  (XELODA ) 500 MG tablet Take 3 tablets (1,500 mg total) by mouth 2 (two) times daily after a meal. Take on days of radiation only, Monday through Friday 07/04/23   Sonja Winter, MD  clonazePAM (KLONOPIN) 0.5 MG tablet Take 0.5 mg by mouth at bedtime as needed for anxiety. 03/12/20   [provider]  cyclobenzaprine  (FLEXERIL ) 10 MG tablet Take 1 tablet (10 mg total) by mouth 3 (three) times daily as needed for muscle spasms. 10/27/23   Pickenpack-Cousar, Athena N, NP  diltiazem  (CARDIZEM  CD) 120 MG 24 hr capsule TAKE 1 CAPSULE BY MOUTH EVERY DAY 04/22/23   Gollan, Timothy J, MD  diltiazem  (CARDIZEM ) 30 MG tablet TAKE 1 TABLET(30 MG) BY MOUTH THREE TIMES DAILY AS NEEDED 06/20/23   Gollan, Timothy J, MD  DULoxetine  (CYMBALTA ) 60 MG capsule Take 60 mg by mouth 2 (two) times daily.     [provider]  ezetimibe (ZETIA) 10 MG tablet Take 10 mg by mouth at bedtime. 02/16/23   [provider]  fluconazole  (DIFLUCAN ) 100 MG tablet Take 1 tablet (100 mg total) by mouth daily. For antibiotics induced vaginitis 09/08/23   Sonja Snyder, MD  Fluticasone-Salmeterol (ADVAIR) 250-50 MCG/DOSE AEPB Inhale 1 puff into the lungs 2 (two) times daily.    [provider]  furosemide  (LASIX ) 40 MG tablet TAKE 1 TABLET BY MOUTH EVERY DAY. MAY TAKE ADDITIONAL TABLET IN THE AM FOR WEIGHT GAIN AS NEEDED 03/10/23   Gollan, Timothy J, MD  JARDIANCE 25 MG TABS tablet Take 25 mg by mouth daily. 08/18/21   [provider]  lidocaine -prilocaine  (EMLA ) cream Apply to affected area once 02/17/23   Sonja [place], MD  magic mouthwash (nystatin , lidocaine , diphenhydrAMINE, alum & mag hydroxide) suspension Take 5 mLs by mouth 4 (four) times daily as needed for mouth pain. 04/14/23   Sonja Foley,  MD  metFORMIN  (GLUCOPHAGE -XR) 500 MG 24 hr tablet Take 1,000 mg by mouth 2 (two) times daily. 10/21/22   [provider]  ondansetron  (ZOFRAN ) 8 MG tablet Take 1 tablet (8 mg total) by mouth every 8 (eight) hours as needed for nausea, vomiting or refractory nausea / vomiting. Start on the third day after chemotherapy. 02/17/23   Sonja [place], MD  Oxycodone  HCl 10 MG TABS Take 1 tablet (10 mg total) by mouth 3 (three) times daily as needed. 10/27/23   Pickenpack-Cousar, Giles Labrum, NP  OXYGEN Inhale 2 L into the lungs at  bedtime. Or as needed throughout the day if short of breath    [provider]  pantoprazole  (PROTONIX ) 40 MG tablet Take 1 tablet (40 mg total) by mouth daily. 05/04/18   Dannial Duty, MD  potassium chloride  SA (KLOR-CON ) 20 MEQ tablet Take 20 mEq by mouth 2 (two) times daily. 02/15/20   [provider]  pregabalin  (LYRICA ) 150 MG capsule Take 1 capsule (150 mg total) by mouth 3 (three) times daily. 09/21/23   Pickenpack-Cousar, Athena N, NP  prochlorperazine  (COMPAZINE ) 10 MG tablet Take 1 tablet (10 mg total) by mouth every 6 (six) hours as needed for nausea or vomiting. 02/17/23   Sonja [place], MD  spironolactone  (ALDACTONE ) 25 MG tablet TAKE 1 TABLET(25 MG) BY MOUTH EVERY MORNING 05/23/23   Gollan, Timothy J, MD  sucralfate  (CARAFATE ) 1 G tablet Take 1 tablet (1 g total) by mouth 4 (four) times daily -  with meals and at bedtime. Patient taking differently: Take 1 g by mouth 2 (two) times daily. 06/23/15   Renold Cashing, MD  tiotropium (SPIRIVA ) 18 MCG inhalation capsule Place 18 mcg into inhaler and inhale daily.    [provider]  Varenicline  Tartrate, Starter, 0.5 MG X 11 & 1 MG X 42 TBPK See admin instructions. follow package directions 02/16/23   [provider]  zolpidem (AMBIEN) 10 MG tablet Take 10 mg by mouth at bedtime as needed for sleep. 12/01/22   [provider]      Allergies    Tramadol and Penicillins    Review of Systems   Review of Systems  Gastrointestinal:  Positive for abdominal pain.    Physical Exam Updated Vital Signs BP 128/66 (BP Location: Left Arm)   Pulse 89   Temp 97.8 F (36.6 C) (Oral)   Resp 20   LMP 03/13/2015 (Approximate) Comment: more than 2 years ago  SpO2 100%  Physical Exam Vitals and nursing note reviewed.  Constitutional:      General: She is not in acute distress.    Appearance: She is not ill-appearing or toxic-appearing.  HENT:     Head: Normocephalic and atraumatic.     Mouth/Throat:      Mouth: Mucous membranes are moist.     Pharynx: No posterior oropharyngeal erythema.  Eyes:     General: No scleral icterus.       Right eye: No discharge.        Left eye: No discharge.     Conjunctiva/sclera: Conjunctivae normal.  Cardiovascular:     Rate and Rhythm: Normal rate and regular rhythm.     Pulses: Normal pulses.     Heart sounds: No murmur heard. Pulmonary:     Effort: Pulmonary effort is normal. No respiratory distress.     Breath sounds: Normal breath sounds. No wheezing, rhonchi or rales.  Abdominal:     General: Abdomen is flat. Bowel sounds  are normal. There is no distension.     Palpations: Abdomen is soft. There is no mass.     Tenderness: There is abdominal tenderness in the epigastric area.  Musculoskeletal:     Right lower leg: No edema.     Left lower leg: No edema.  Skin:    General: Skin is warm and dry.     Findings: No rash.  Neurological:     General: No focal deficit present.     Mental Status: She is alert and oriented to person, place, and time. Mental status is at baseline.  Psychiatric:        Mood and Affect: Mood normal.        Behavior: Behavior normal.     ED Results / Procedures / Treatments   Labs (all labs ordered are listed, but only abnormal results are displayed) Labs Reviewed  COMPREHENSIVE METABOLIC PANEL WITH GFR - Abnormal; Notable for the following components:      Result Value   CO2 21 (*)    Glucose, Bld 158 (*)    Anion gap 16 (*)    All other components within normal limits  CBC - Abnormal; Notable for the following components:   WBC 11.4 (*)    MCV 102.1 (*)    RDW 17.0 (*)    All other components within normal limits  LIPASE, BLOOD  URINALYSIS, ROUTINE W REFLEX MICROSCOPIC    EKG None  Radiology No results found.  Procedures Procedures  {Document cardiac monitor, telemetry assessment procedure when appropriate:1}  Medications Ordered in ED Medications - No data to display  ED Course/ Medical  Decision Making/ A&P   {   Click here for ABCD2, HEART and other calculatorsREFRESH Note before signing :1}                              Medical Decision Making Amount and/or Complexity of Data Reviewed Radiology: ordered.  Risk OTC drugs. Prescription drug management.    This patient presents to the ED for concern of abdominal pain, this involves an extensive number of treatment options, and is a complaint that carries with it a high risk of complications and morbidity.  The differential diagnosis includes gastroenteritis, colitis, small bowel obstruction, appendicitis, cholecystitis, pancreatitis, nephrolithiasis, UTI, pyelonephritis   Co morbidities that complicate the patient evaluation  CHF, anxiety, OA, asthma, bell's palsy, COPD on 2L [place] at baseline, DM, HLD, HTN, chronic pain syndrome, metastatic rectal cancer currently on radiation   Additional history obtained:  Oncologist provider Pickenpack-Cousar, NP   Problem List / ED Course / Critical interventions / Medication management  Patient presented for epigastric abdominal pain that started today.  No other infectious symptoms.  Physical exam with epigastric tenderness palpation.  Rest of physical exam reassuring.  Patient afebrile with stable vitals.  Patient asking for something to eat during initial interview. I Ordered, and personally interpreted labs.  Lipase within normal limits.  CBC with mild leukocytosis 11.4.  No anemia.  CMP with mildly low CO2 at 21 and mildly elevated anion gap at 16.  UA not overly concerning for UTI. I ordered imaging studies including CT Abd/Pelvis with contrast: evaluate for structural/surgical etiology of patients' severe abdominal pain.  I independently visualized and interpreted imaging and I agree with the radiologist interpretation of no acute process. Shared all results with patient.  Answered all questions.  Provided patient with a GI cocktail ***. Patient tolerating PO  in ED ***. I  have reviewed the patients home medicines and have made adjustments as needed The patient has been appropriately medically screened and/or stabilized in the ED. I have low suspicion for any other emergent medical condition which would require further screening, evaluation or treatment in the ED or require inpatient management. At time of discharge the patient is hemodynamically stable and in no acute distress. I have discussed work-up results and diagnosis with patient and answered all questions. Patient is agreeable with discharge plan. We discussed strict return precautions for returning to the emergency department and they verbalized understanding.      Social Determinants of Health:  none    {Document critical care time when appropriate:1} {Document review of labs and clinical decision tools ie heart score, Chads2Vasc2 etc:1}  {Document your independent review of radiology images, and any outside records:1} {Document your discussion with family members, caretakers, and with consultants:1} {Document social determinants of health affecting pt's care:1} {Document your decision making why or why not admission, treatments were needed:1} Final Clinical Impression(s) / ED Diagnoses Final diagnoses:  None    Rx / DC Orders ED Discharge Orders     None

## 2023-11-22 NOTE — ED Triage Notes (Signed)
 Pt BIB [facility] for ABD pain x1day, hx of lung&colon cancer, 2L [place] at baseline. Hx pancreatitis.  Last radiation tx last thurs PTA 20g IV LAC

## 2023-11-23 ENCOUNTER — Encounter (HOSPITAL_COMMUNITY): Payer: Self-pay | Admitting: Internal Medicine

## 2023-11-23 ENCOUNTER — Inpatient Hospital Stay (HOSPITAL_COMMUNITY)
Admission: EM | Admit: 2023-11-23 | Discharge: 2023-11-27 | DRG: 445 | Disposition: A | Discharge status: Home / Self Care | Attending: Internal Medicine | Admitting: Internal Medicine

## 2023-11-23 ENCOUNTER — Emergency Department (HOSPITAL_COMMUNITY)

## 2023-11-23 DIAGNOSIS — Z833 Family history of diabetes mellitus: Secondary | ICD-10-CM

## 2023-11-23 DIAGNOSIS — R6510 Systemic inflammatory response syndrome (SIRS) of non-infectious origin without acute organ dysfunction: Secondary | ICD-10-CM | POA: Diagnosis present

## 2023-11-23 DIAGNOSIS — Z560 Unemployment, unspecified: Secondary | ICD-10-CM

## 2023-11-23 DIAGNOSIS — Z515 Encounter for palliative care: Secondary | ICD-10-CM

## 2023-11-23 DIAGNOSIS — E1165 Type 2 diabetes mellitus with hyperglycemia: Secondary | ICD-10-CM | POA: Diagnosis present

## 2023-11-23 DIAGNOSIS — Z79899 Other long term (current) drug therapy: Secondary | ICD-10-CM

## 2023-11-23 DIAGNOSIS — Z7984 Long term (current) use of oral hypoglycemic drugs: Secondary | ICD-10-CM

## 2023-11-23 DIAGNOSIS — E878 Other disorders of electrolyte and fluid balance, not elsewhere classified: Secondary | ICD-10-CM | POA: Diagnosis present

## 2023-11-23 DIAGNOSIS — C20 Malignant neoplasm of rectum: Secondary | ICD-10-CM | POA: Diagnosis present

## 2023-11-23 DIAGNOSIS — Z801 Family history of malignant neoplasm of trachea, bronchus and lung: Secondary | ICD-10-CM

## 2023-11-23 DIAGNOSIS — J441 Chronic obstructive pulmonary disease with (acute) exacerbation: Secondary | ICD-10-CM | POA: Diagnosis present

## 2023-11-23 DIAGNOSIS — G4733 Obstructive sleep apnea (adult) (pediatric): Secondary | ICD-10-CM | POA: Diagnosis present

## 2023-11-23 DIAGNOSIS — J440 Chronic obstructive pulmonary disease with acute lower respiratory infection: Secondary | ICD-10-CM | POA: Diagnosis present

## 2023-11-23 DIAGNOSIS — Z6825 Body mass index (BMI) 25.0-25.9, adult: Secondary | ICD-10-CM

## 2023-11-23 DIAGNOSIS — K810 Acute cholecystitis: Principal | ICD-10-CM | POA: Diagnosis present

## 2023-11-23 DIAGNOSIS — Z9221 Personal history of antineoplastic chemotherapy: Secondary | ICD-10-CM

## 2023-11-23 DIAGNOSIS — Z8711 Personal history of peptic ulcer disease: Secondary | ICD-10-CM

## 2023-11-23 DIAGNOSIS — J9611 Chronic respiratory failure with hypoxia: Secondary | ICD-10-CM | POA: Diagnosis present

## 2023-11-23 DIAGNOSIS — Z716 Tobacco abuse counseling: Secondary | ICD-10-CM

## 2023-11-23 DIAGNOSIS — E1142 Type 2 diabetes mellitus with diabetic polyneuropathy: Secondary | ICD-10-CM | POA: Diagnosis present

## 2023-11-23 DIAGNOSIS — I110 Hypertensive heart disease with heart failure: Secondary | ICD-10-CM | POA: Diagnosis present

## 2023-11-23 DIAGNOSIS — G894 Chronic pain syndrome: Secondary | ICD-10-CM | POA: Diagnosis present

## 2023-11-23 DIAGNOSIS — G893 Neoplasm related pain (acute) (chronic): Secondary | ICD-10-CM

## 2023-11-23 DIAGNOSIS — I5032 Chronic diastolic (congestive) heart failure: Secondary | ICD-10-CM | POA: Diagnosis present

## 2023-11-23 DIAGNOSIS — E66811 Obesity, class 1: Secondary | ICD-10-CM | POA: Diagnosis present

## 2023-11-23 DIAGNOSIS — K8000 Calculus of gallbladder with acute cholecystitis without obstruction: Principal | ICD-10-CM | POA: Diagnosis present

## 2023-11-23 DIAGNOSIS — I2510 Atherosclerotic heart disease of native coronary artery without angina pectoris: Secondary | ICD-10-CM | POA: Diagnosis present

## 2023-11-23 DIAGNOSIS — I700 Atherosclerosis of aorta: Secondary | ICD-10-CM | POA: Diagnosis present

## 2023-11-23 DIAGNOSIS — Z7951 Long term (current) use of inhaled steroids: Secondary | ICD-10-CM

## 2023-11-23 DIAGNOSIS — F419 Anxiety disorder, unspecified: Secondary | ICD-10-CM | POA: Diagnosis present

## 2023-11-23 DIAGNOSIS — Z5986 Financial insecurity: Secondary | ICD-10-CM

## 2023-11-23 DIAGNOSIS — Z880 Allergy status to penicillin: Secondary | ICD-10-CM

## 2023-11-23 DIAGNOSIS — K760 Fatty (change of) liver, not elsewhere classified: Secondary | ICD-10-CM | POA: Diagnosis present

## 2023-11-23 DIAGNOSIS — E871 Hypo-osmolality and hyponatremia: Secondary | ICD-10-CM | POA: Diagnosis present

## 2023-11-23 DIAGNOSIS — Z803 Family history of malignant neoplasm of breast: Secondary | ICD-10-CM

## 2023-11-23 DIAGNOSIS — E876 Hypokalemia: Secondary | ICD-10-CM | POA: Diagnosis present

## 2023-11-23 DIAGNOSIS — K5900 Constipation, unspecified: Secondary | ICD-10-CM | POA: Diagnosis present

## 2023-11-23 DIAGNOSIS — Z794 Long term (current) use of insulin: Secondary | ICD-10-CM

## 2023-11-23 DIAGNOSIS — R002 Palpitations: Secondary | ICD-10-CM | POA: Diagnosis present

## 2023-11-23 DIAGNOSIS — F17210 Nicotine dependence, cigarettes, uncomplicated: Secondary | ICD-10-CM | POA: Diagnosis present

## 2023-11-23 DIAGNOSIS — E785 Hyperlipidemia, unspecified: Secondary | ICD-10-CM | POA: Diagnosis present

## 2023-11-23 DIAGNOSIS — F32A Depression, unspecified: Secondary | ICD-10-CM | POA: Diagnosis present

## 2023-11-23 DIAGNOSIS — Z923 Personal history of irradiation: Secondary | ICD-10-CM

## 2023-11-23 DIAGNOSIS — C7800 Secondary malignant neoplasm of unspecified lung: Secondary | ICD-10-CM | POA: Diagnosis present

## 2023-11-23 DIAGNOSIS — E119 Type 2 diabetes mellitus without complications: Secondary | ICD-10-CM

## 2023-11-23 DIAGNOSIS — I10 Essential (primary) hypertension: Secondary | ICD-10-CM | POA: Diagnosis present

## 2023-11-23 LAB — URINALYSIS, W/ REFLEX TO CULTURE (INFECTION SUSPECTED)
Bacteria, UA: NONE SEEN
Bilirubin Urine: NEGATIVE
Glucose, UA: >=500 mg/dL — AB
Hgb urine dipstick: NEGATIVE
Ketones, ur: 5 mg/dL — AB
Leukocytes,Ua: NEGATIVE
Nitrite: NEGATIVE
Protein, ur: NEGATIVE mg/dL
Specific Gravity, Urine: 1.036 — ABNORMAL HIGH (ref 1.005–1.030)
pH: 6 (ref 5.0–8.0)

## 2023-11-23 LAB — HEMOGLOBIN A1C
Hgb A1c MFr Bld: 5.6 % (ref 4.8–5.6)
Mean Plasma Glucose: 114.02 mg/dL

## 2023-11-23 LAB — TROPONIN I (HIGH SENSITIVITY)
Troponin I (High Sensitivity): 6 ng/L (ref <18)
Troponin I (High Sensitivity): 5 ng/L (ref <18)

## 2023-11-23 LAB — COMPREHENSIVE METABOLIC PANEL WITH GFR
ALT: 20 U/L (ref 0–44)
AST: 25 U/L (ref 15–41)
Albumin: 4 g/dL (ref 3.5–5.0)
Alkaline Phosphatase: 111 U/L (ref 38–126)
Anion gap: 14 (ref 5–15)
BUN: 11 mg/dL (ref 6–20)
CO2: 22 mmol/L (ref 22–32)
Calcium: 9.5 mg/dL (ref 8.9–10.3)
Chloride: 90 mmol/L — ABNORMAL LOW (ref 98–111)
Creatinine, Ser: 0.78 mg/dL (ref 0.44–1.00)
GFR, Estimated: >60 mL/min (ref >60)
Glucose, Bld: 195 mg/dL — ABNORMAL HIGH (ref 70–99)
Potassium: 4 mmol/L (ref 3.5–5.1)
Sodium: 126 mmol/L — ABNORMAL LOW (ref 135–145)
Total Bilirubin: 1.2 mg/dL (ref 0.0–1.2)
Total Protein: 8.4 g/dL — ABNORMAL HIGH (ref 6.5–8.1)

## 2023-11-23 LAB — CBC WITH DIFFERENTIAL/PLATELET
Abs Immature Granulocytes: 0.13 10*3/uL — ABNORMAL HIGH (ref 0.00–0.07)
Basophils Absolute: 0.1 10*3/uL (ref 0.0–0.1)
Basophils Relative: 0 %
Eosinophils Absolute: 0 10*3/uL (ref 0.0–0.5)
Eosinophils Relative: 0 %
HCT: 45.6 % (ref 36.0–46.0)
Hemoglobin: 15.1 g/dL — ABNORMAL HIGH (ref 12.0–15.0)
Immature Granulocytes: 1 %
Lymphocytes Relative: 3 %
Lymphs Abs: 0.5 10*3/uL — ABNORMAL LOW (ref 0.7–4.0)
MCH: 31.9 pg (ref 26.0–34.0)
MCHC: 33.1 g/dL (ref 30.0–36.0)
MCV: 96.2 fL (ref 80.0–100.0)
Monocytes Absolute: 1 10*3/uL (ref 0.1–1.0)
Monocytes Relative: 6 %
Neutro Abs: 15.5 10*3/uL — ABNORMAL HIGH (ref 1.7–7.7)
Neutrophils Relative %: 90 %
Platelets: 270 10*3/uL (ref 150–400)
RBC: 4.74 MIL/uL (ref 3.87–5.11)
RDW: 16.8 % — ABNORMAL HIGH (ref 11.5–15.5)
WBC: 17.2 10*3/uL — ABNORMAL HIGH (ref 4.0–10.5)
nRBC: 0 % (ref 0.0–0.2)

## 2023-11-23 LAB — LIPASE, BLOOD: Lipase: 25 U/L (ref 11–51)

## 2023-11-23 LAB — GLUCOSE, CAPILLARY: Glucose-Capillary: 138 mg/dL — ABNORMAL HIGH (ref 70–99)

## 2023-11-23 MED ORDER — HYDRALAZINE HCL 20 MG/ML IJ SOLN
10.0000 mg | Freq: Four times a day (QID) | INTRAMUSCULAR | Status: DC | PRN
  Administered 2023-11-23: 10 mg via INTRAVENOUS
  Filled 2023-11-23: qty 1

## 2023-11-23 MED ORDER — ONDANSETRON HCL 4 MG/2ML IJ SOLN
4.0000 mg | Freq: Once | INTRAMUSCULAR | Status: AC
  Administered 2023-11-23: 4 mg via INTRAVENOUS
  Filled 2023-11-23: qty 2

## 2023-11-23 MED ORDER — PREGABALIN 75 MG PO CAPS
150.0000 mg | ORAL_CAPSULE | Freq: Three times a day (TID) | ORAL | Status: DC
  Administered 2023-11-23 – 2023-11-27 (×10): 150 mg via ORAL
  Filled 2023-11-23 (×10): qty 2

## 2023-11-23 MED ORDER — TIOTROPIUM BROMIDE MONOHYDRATE 18 MCG IN CAPS: 18.0000 ug | ORAL_CAPSULE | Freq: Every day | RESPIRATORY_TRACT | Status: DC

## 2023-11-23 MED ORDER — ALBUTEROL SULFATE (2.5 MG/3ML) 0.083% IN NEBU: 2.5000 mg | INHALATION_SOLUTION | Freq: Four times a day (QID) | RESPIRATORY_TRACT | Status: DC | PRN

## 2023-11-23 MED ORDER — SODIUM CHLORIDE 0.9 % IV SOLN
2.0000 g | Freq: Three times a day (TID) | INTRAVENOUS | Status: DC
  Administered 2023-11-24 – 2023-11-27 (×11): 2 g via INTRAVENOUS
  Filled 2023-11-23 (×12): qty 12.5

## 2023-11-23 MED ORDER — HYDROMORPHONE HCL 1 MG/ML IJ SOLN
0.5000 mg | Freq: Once | INTRAMUSCULAR | Status: AC
  Administered 2023-11-23: 0.5 mg via INTRAVENOUS
  Filled 2023-11-23: qty 1

## 2023-11-23 MED ORDER — METRONIDAZOLE 500 MG/100ML IV SOLN
500.0000 mg | Freq: Once | INTRAVENOUS | Status: AC
  Administered 2023-11-23: 500 mg via INTRAVENOUS
  Filled 2023-11-23: qty 100

## 2023-11-23 MED ORDER — ACETAMINOPHEN 650 MG RE SUPP: 650.0000 mg | Freq: Four times a day (QID) | RECTAL | Status: DC | PRN

## 2023-11-23 MED ORDER — LABETALOL HCL 5 MG/ML IV SOLN
10.0000 mg | Freq: Four times a day (QID) | INTRAVENOUS | Status: DC | PRN
  Administered 2023-11-23: 10 mg via INTRAVENOUS
  Filled 2023-11-23 (×2): qty 4

## 2023-11-23 MED ORDER — POLYETHYLENE GLYCOL 3350 17 G PO PACK: 17.0000 g | PACK | Freq: Every day | ORAL | Status: DC | PRN

## 2023-11-23 MED ORDER — SODIUM CHLORIDE 0.9% FLUSH
10.0000 mL | INTRAVENOUS | Status: DC | PRN
  Administered 2023-11-27: 10 mL

## 2023-11-23 MED ORDER — CHLORHEXIDINE GLUCONATE CLOTH 2 % EX PADS
6.0000 | MEDICATED_PAD | Freq: Every day | CUTANEOUS | Status: DC
  Administered 2023-11-23 – 2023-11-27 (×5): 6 via TOPICAL

## 2023-11-23 MED ORDER — PANTOPRAZOLE SODIUM 40 MG PO TBEC
40.0000 mg | DELAYED_RELEASE_TABLET | Freq: Every day | ORAL | Status: DC
  Administered 2023-11-24 – 2023-11-27 (×4): 40 mg via ORAL
  Filled 2023-11-23 (×4): qty 1

## 2023-11-23 MED ORDER — HYDROMORPHONE HCL 1 MG/ML IJ SOLN
0.5000 mg | INTRAMUSCULAR | Status: DC | PRN
  Administered 2023-11-23 – 2023-11-25 (×4): 1 mg via INTRAVENOUS
  Administered 2023-11-26: 0.5 mg via INTRAVENOUS
  Filled 2023-11-23 (×5): qty 1

## 2023-11-23 MED ORDER — OXYCODONE HCL 5 MG PO TABS
10.0000 mg | ORAL_TABLET | Freq: Three times a day (TID) | ORAL | Status: DC | PRN
  Administered 2023-11-24 – 2023-11-27 (×10): 10 mg via ORAL
  Filled 2023-11-23 (×10): qty 2

## 2023-11-23 MED ORDER — INSULIN ASPART 100 UNIT/ML IJ SOLN
0.0000 [IU] | Freq: Every day | INTRAMUSCULAR | Status: DC
  Filled 2023-11-23: qty 0.05

## 2023-11-23 MED ORDER — INSULIN ASPART 100 UNIT/ML IJ SOLN
0.0000 [IU] | Freq: Three times a day (TID) | INTRAMUSCULAR | Status: DC
  Administered 2023-11-24: 1 [IU] via SUBCUTANEOUS
  Administered 2023-11-24: 2 [IU] via SUBCUTANEOUS
  Administered 2023-11-25: 1 [IU] via SUBCUTANEOUS
  Administered 2023-11-26 – 2023-11-27 (×4): 2 [IU] via SUBCUTANEOUS
  Filled 2023-11-23: qty 0.09

## 2023-11-23 MED ORDER — SODIUM CHLORIDE 0.9 % IV SOLN
2.0000 g | Freq: Once | INTRAVENOUS | Status: AC
  Administered 2023-11-23: 2 g via INTRAVENOUS
  Filled 2023-11-23: qty 12.5

## 2023-11-23 MED ORDER — PANTOPRAZOLE SODIUM 40 MG IV SOLR
40.0000 mg | Freq: Once | INTRAVENOUS | Status: AC
  Administered 2023-11-23: 40 mg via INTRAVENOUS
  Filled 2023-11-23: qty 10

## 2023-11-23 MED ORDER — OXYCODONE HCL 5 MG PO TABS
5.0000 mg | ORAL_TABLET | ORAL | Status: DC | PRN
  Administered 2023-11-23: 5 mg via ORAL
  Filled 2023-11-23: qty 1

## 2023-11-23 MED ORDER — EZETIMIBE 10 MG PO TABS
10.0000 mg | ORAL_TABLET | Freq: Every day | ORAL | Status: DC
Start: 2023-11-23 — End: 2023-11-24

## 2023-11-23 MED ORDER — ACETAMINOPHEN 325 MG PO TABS
650.0000 mg | ORAL_TABLET | Freq: Four times a day (QID) | ORAL | Status: DC | PRN
  Administered 2023-11-26 – 2023-11-27 (×3): 650 mg via ORAL
  Filled 2023-11-23 (×3): qty 2

## 2023-11-23 MED ORDER — BISACODYL 5 MG PO TBEC: 5.0000 mg | DELAYED_RELEASE_TABLET | Freq: Every day | ORAL | Status: DC | PRN

## 2023-11-23 MED ORDER — METRONIDAZOLE 500 MG/100ML IV SOLN
500.0000 mg | Freq: Two times a day (BID) | INTRAVENOUS | Status: DC
  Administered 2023-11-24 – 2023-11-27 (×7): 500 mg via INTRAVENOUS
  Filled 2023-11-23 (×7): qty 100

## 2023-11-23 MED ORDER — SODIUM CHLORIDE 0.9 % IV SOLN: INTRAVENOUS | Status: AC

## 2023-11-23 MED ORDER — DILTIAZEM HCL ER COATED BEADS 120 MG PO CP24
120.0000 mg | ORAL_CAPSULE | Freq: Every day | ORAL | Status: DC
  Administered 2023-11-24 – 2023-11-27 (×4): 120 mg via ORAL
  Filled 2023-11-23 (×4): qty 1

## 2023-11-23 MED ORDER — SODIUM CHLORIDE 0.9 % IV BOLUS
1000.0000 mL | Freq: Once | INTRAVENOUS | Status: AC
  Administered 2023-11-23: 1000 mL via INTRAVENOUS

## 2023-11-23 MED ORDER — ENOXAPARIN SODIUM 40 MG/0.4ML IJ SOSY
40.0000 mg | PREFILLED_SYRINGE | INTRAMUSCULAR | Status: DC
  Administered 2023-11-24: 40 mg via SUBCUTANEOUS
  Filled 2023-11-23: qty 0.4

## 2023-11-23 NOTE — Discharge Instructions (Addendum)
 It was a pleasure caring for you.  Please follow-up with GI provider.  Seek emergency care if experiencing any new or worsening symptoms.

## 2023-11-23 NOTE — ED Triage Notes (Signed)
 Pt presents with abdominal pain that radiates to her back. Pt was recently seen for similar symptoms yesterday. Pt states that the pain returned as soon as she got home.

## 2023-11-23 NOTE — ED Notes (Signed)
 Unable to obtain repeat trop d/t malfunctioning port.

## 2023-11-23 NOTE — Consult Note (Signed)
 Reason for Consult: abdominal pain Referring Physician: Dahal  Erica [name] is an 58 y.o. female.  HPI: Patient presented to the emergency department last night with around 24 hours of epigastric pain radiating to the back.  The patient has a long history of rectal cancer and chronic pain syndrome as well as gastric ulcers.  She is undergoing treatment for her oligometastatic disease of her rectal cancer with Xeloda  and radiation.  This has been improving.  She has been gaining weight back and has upcoming restaging studies to figure out if she is a surgical candidate.  However recently she had this pain, and was initially responsive to some of her home oxycodone .  The pain continued despite the oxycodone  and so she came back to the emergency department.  She denies fevers or chills.  She denies vomiting.  She denies jaundice.  She denies shortness of breath.    She states the pain is worse that any of the pain with orthopaedic surgeries that she has had.    Past Medical History:  Diagnosis Date   (HFpEF) heart failure with preserved ejection fraction (HCC)    a. 05/2015 Echo: EF 60-65%, no rwma, PASP .   Acute pancreatitis 08/13/2018   Anxiety    Arthritis    Asthma    Bell's palsy    no deficit   Bronchitis    Cancer (HCC) 12/2022   rectal cancer   CHF (congestive heart failure) (HCC)    COPD (chronic obstructive pulmonary disease) (HCC)    Depression    Diabetes mellitus without complication (HCC)    type II 03/2017   Dyspnea    Fatty liver    per patient   Gall stones    per patient   Gastric ulcer    Hyperlipidemia    Hypertension    Lung nodule 12/2022   upper left   OSA (obstructive sleep apnea)    a. did not tolerate CPAP. On oxygen 2L via [place]   Pancreatitis    07/2018   Shoulder injury    6/19   Smoker     Past Surgical History:  Procedure Laterality Date   BRONCHIAL NEEDLE ASPIRATION BIOPSY  01/27/2023   Procedure: BRONCHIAL NEEDLE ASPIRATION BIOPSIES;   Surgeon: Vergia Glasgow, MD;  Location: [facility] ENDOSCOPY;  Service: Pulmonary;;   COLONOSCOPY WITH PROPOFOL  N/A 12/15/2022   Procedure: COLONOSCOPY WITH PROPOFOL ;  Surgeon: Luke Salaam, MD;  Location: [facility] ENDOSCOPY;  Service: Gastroenterology;  Laterality: N/A;   COLONOSCOPY WITH PROPOFOL  N/A 12/16/2022   Procedure: COLONOSCOPY WITH PROPOFOL ;  Surgeon: Luke Salaam, MD;  Location: [facility] ENDOSCOPY;  Service: Gastroenterology;  Laterality: N/A;   FLEXIBLE BRONCHOSCOPY N/A 06/20/2015   Procedure: FLEXIBLE BRONCHOSCOPY;  Surgeon: Enos Harts, MD;  Location: [facility] ORS;  Service: Pulmonary;  Laterality: N/A;   IR IMAGING GUIDED PORT INSERTION  02/25/2023   KNEE SURGERY Left    8 knee surgeries    Family History  Problem Relation Age of Onset   Breast cancer Mother        early 15's   Lung cancer Father    Brain cancer Father    Bone cancer Father    Colon cancer Maternal Aunt    Cancer Maternal Uncle        liver?   Diabetes Maternal Grandmother    Asthma Other     Social History:  reports that she has been smoking cigarettes. She has a 138 pack-year smoking history. She has never used smokeless tobacco.  She reports current alcohol use. She reports that she does not use drugs.  Allergies:  Allergies  Allergen Reactions   Tramadol Other (See Comments) and Palpitations    Heart Skipping Beats Heart palpatations   Penicillins Rash    Has patient had a PCN reaction causing immediate rash, facial/tongue/throat swelling, SOB or lightheadedness with hypotension: Yes Has patient had a PCN reaction causing severe rash involving mucus membranes or skin necrosis: No Has patient had a PCN reaction that required hospitalization: No Has patient had a PCN reaction occurring within the last 10 years: No If all of the above answers are "NO", then may proceed with Cephalosporin use.    Medications:  Oxycodone  HCl 10 MG TABS albuterol  (VENTOLIN  HFA) 108 (90 Base) MCG/ACT  inhaler amoxicillin -clavulanate (AUGMENTIN ) 875-125 MG tablet Blood Glucose Monitoring Suppl (ACCU-CHEK AVIVA PLUS) w/Device KIT busPIRone (BUSPAR) 30 MG tablet capecitabine  (XELODA ) 500 MG tablet clonazePAM (KLONOPIN) 0.5 MG tablet cyclobenzaprine  (FLEXERIL ) 10 MG tablet diltiazem  (CARDIZEM  CD) 120 MG 24 hr capsule diltiazem  (CARDIZEM ) 30 MG tablet DULoxetine  (CYMBALTA ) 60 MG capsule ezetimibe (ZETIA) 10 MG tablet fluconazole  (DIFLUCAN ) 100 MG tablet Fluticasone-Salmeterol (ADVAIR) 250-50 MCG/DOSE AEPB furosemide  (LASIX ) 40 MG tablet JARDIANCE 25 MG TABS tablet lidocaine -prilocaine  (EMLA ) cream magic mouthwash (nystatin , lidocaine , diphenhydrAMINE, alum & mag hydroxide) suspension metFORMIN  (GLUCOPHAGE -XR) 500 MG 24 hr tablet ondansetron  (ZOFRAN ) 8 MG tablet OXYGEN pantoprazole  (PROTONIX ) 40 MG tablet potassium chloride  SA (KLOR-CON ) 20 MEQ tablet pregabalin  (LYRICA ) 150 MG capsule prochlorperazine  (COMPAZINE ) 10 MG tablet spironolactone  (ALDACTONE ) 25 MG tablet sucralfate  (CARAFATE ) 1 G tablet tiotropium (SPIRIVA ) 18 MCG inhalation capsule Varenicline  Tartrate, Starter, 0.5 MG X 11 & 1 MG X 42 TBPK zolpidem (AMBIEN) 10 MG tablet    Results for orders placed or performed during the hospital encounter of 11/23/23 (from the past 48 hours)  CBC with Differential     Status: Abnormal   Collection Time: 11/23/23  4:09 PM  Result Value Ref Range   WBC 17.2 (H) 4.0 - 10.5 K/uL   RBC 4.74 3.87 - 5.11 MIL/uL   Hemoglobin 15.1 (H) 12.0 - 15.0 g/dL   HCT 29.5 62.1 - 30.8 %   MCV 96.2 80.0 - 100.0 fL   MCH 31.9 26.0 - 34.0 pg   MCHC 33.1 30.0 - 36.0 g/dL   RDW 65.7 (H) 84.6 - 96.2 %   Platelets 270 150 - 400 K/uL   nRBC 0.0 0.0 - 0.2 %   Neutrophils Relative % 90 %   Neutro Abs 15.5 (H) 1.7 - 7.7 K/uL   Lymphocytes Relative 3 %   Lymphs Abs 0.5 (L) 0.7 - 4.0 K/uL   Monocytes Relative 6 %   Monocytes Absolute 1.0 0.1 - 1.0 K/uL   Eosinophils Relative 0 %   Eosinophils Absolute  0.0 0.0 - 0.5 K/uL   Basophils Relative 0 %   Basophils Absolute 0.1 0.0 - 0.1 K/uL   Immature Granulocytes 1 %   Abs Immature Granulocytes 0.13 (H) 0.00 - 0.07 K/uL    Comment: Performed at [facility], [address], [place], [place] [address]  Comprehensive metabolic panel     Status: Abnormal   Collection Time: 11/23/23  4:09 PM  Result Value Ref Range   Sodium 126 (L) 135 - 145 mmol/L    Comment: DELTA CHECK NOTED REPEATED TO VERIFY    Potassium 4.0 3.5 - 5.1 mmol/L   Chloride 90 (L) 98 - 111 mmol/L   CO2 22 22 - 32 mmol/L  Glucose, Bld 195 (H) 70 - 99 mg/dL    Comment: Glucose reference range applies only to samples taken after fasting for at least 8 hours.   BUN 11 6 - 20 mg/dL   Creatinine, Ser 1.61 0.44 - 1.00 mg/dL   Calcium  9.5 8.9 - 10.3 mg/dL   Total Protein 8.4 (H) 6.5 - 8.1 g/dL   Albumin 4.0 3.5 - 5.0 g/dL   AST 25 15 - 41 U/L   ALT 20 0 - 44 U/L   Alkaline Phosphatase 111 38 - 126 U/L   Total Bilirubin 1.2 0.0 - 1.2 mg/dL   GFR, Estimated >09 >60 mL/min    Comment: (NOTE) Calculated using the CKD-EPI Creatinine Equation (2021)    Anion gap 14 5 - 15    Comment: Performed at [facility], [address] [place], [place] [address]  Lipase, blood     Status: None   Collection Time: 11/23/23  4:09 PM  Result Value Ref Range   Lipase 25 11 - 51 U/L    Comment: Performed at [facility], [address], [place], [place] [address]  Troponin I (High Sensitivity)     Status: None   Collection Time: 11/23/23  4:09 PM  Result Value Ref Range   Troponin I (High Sensitivity) 6 <18 ng/L    Comment: (NOTE) Elevated high sensitivity troponin I (hsTnI) values and significant  changes across serial measurements may suggest ACS but many other  chronic and acute conditions are known to elevate hsTnI results.  Refer to the "Links" section for chest pain algorithms and additional  guidance. Performed at [facility], [address] [place], [place] [address]     US  Abdomen Limited RUQ (LIVER/GB) Result Date: 11/23/2023 CLINICAL DATA:  Right upper quadrant pain EXAM: ULTRASOUND ABDOMEN LIMITED RIGHT UPPER QUADRANT COMPARISON:  CT abdomen and pelvis 11/22/2023. Abdominal ultrasound 08/12/2018. FINDINGS: Gallbladder: Gallbladder wall thickened measuring 4 mm. There is a small amount of pericholecystic fluid. Gallbladder calculi are seen measuring up to 6 mm. Sonographic Abigail Abler sign is negative. Common bile duct: Diameter: 3 mm Liver: No focal lesion identified. Increased in parenchymal echogenicity. Portal vein is patent on color Doppler imaging with normal direction of blood flow towards the liver. Other: None. IMPRESSION: 1. Cholelithiasis with gallbladder wall thickening and pericholecystic fluid. Findings are concerning for acute cholecystitis. 2. Hepatic steatosis. Electronically Signed   By: Tyron Gallon M.D.   On: 11/23/2023 18:12   CT ABDOMEN PELVIS W CONTRAST Result Date: 11/22/2023 CLINICAL DATA:  Acute nonlocalized abdominal pain. History of lung and colon cancer. History of pancreatitis. EXAM: CT ABDOMEN AND PELVIS WITH CONTRAST TECHNIQUE: Multidetector CT imaging of the abdomen and pelvis was performed using the standard protocol following bolus administration of intravenous contrast. RADIATION DOSE REDUCTION: This exam was performed according to the departmental dose-optimization program which includes automated exposure control, adjustment of the mA and/or kV according to patient size and/or use of iterative reconstruction technique. CONTRAST:  OMNIPAQUE  IOHEXOL  300 MG/ML  SOLN COMPARISON:  CT chest abdomen pelvis 11/01/2023 FINDINGS: Lower chest: No acute abnormality. Hepatobiliary: Hepatic steatosis. No focal hepatic lesion. Cholelithiasis without evidence of acute cholecystitis. No biliary dilation Pancreas: Unremarkable. No pancreatic ductal dilatation or surrounding  inflammatory changes. Spleen: Unremarkable. Adrenals/Urinary Tract: Nodular thickening in both adrenal glands is stable dating back to 01/03/2023. No urinary calculi or hydronephrosis. Unremarkable bladder. Stomach/Bowel: Asymmetric wall thickening about the rectum greater on the left (circa series 2/image  69) is grossly similar to 11/01/2023. There is adjacent fat stranding and trace fluid. Otherwise no thickening of the wall of the small or large bowel. Stomach is within normal limits. The appendix is not visualized. No bowel obstruction. Vascular/Lymphatic: Aortic atherosclerosis. No enlarged abdominal or pelvic lymph nodes. Reproductive: Uterus and bilateral adnexa are unremarkable. Other: No free intraperitoneal air.  No abscess. Musculoskeletal: No acute fracture or destructive osseous lesion. IMPRESSION: 1. Stable asymmetric wall thickening about the rectum greater on the left with adjacent spiculation and edema. 2. Hepatic steatosis. 3. Cholelithiasis without evidence of acute cholecystitis. 4. Aortic Atherosclerosis (ICD10-I70.0). Electronically Signed   By: Rozell Cornet M.D.   On: 11/22/2023 23:45    Review of Systems  Constitutional: Negative.   HENT: Negative.    Eyes: Negative.   Respiratory: Negative.    Gastrointestinal:  Positive for abdominal pain.  Endocrine: Negative.   Genitourinary: Negative.   Musculoskeletal: Negative.   Skin: Negative.   Allergic/Immunologic: Negative.   Neurological: Negative.   Hematological: Negative.   Psychiatric/Behavioral: Negative.    All other systems reviewed and are negative.   Blood pressure (!) 177/91, pulse (!) 103, temperature 98.8 F (37.1 C), temperature source Oral, resp. rate 20, last menstrual period 03/13/2015, SpO2 99%.  Physical Exam Vitals reviewed.  Constitutional:      General: She is not in acute distress.    Appearance: She is normal weight. She is not toxic-appearing or diaphoretic.  HENT:     Head: Normocephalic  and atraumatic.     Mouth/Throat:     Mouth: Mucous membranes are moist.  Eyes:     General: No scleral icterus.    Extraocular Movements: Extraocular movements intact.     Pupils: Pupils are equal, round, and reactive to light.  Cardiovascular:     Rate and Rhythm: Normal rate and regular rhythm.     Heart sounds: Normal heart sounds. No murmur heard. Pulmonary:     Effort: Pulmonary effort is normal. No respiratory distress.     Breath sounds: Normal breath sounds.  Abdominal:     General: Abdomen is flat. Bowel sounds are normal. There is no distension or abdominal bruit. There are no signs of injury.     Palpations: Abdomen is soft. There is hepatomegaly. There is no splenomegaly or mass.     Tenderness: There is abdominal tenderness in the right upper quadrant and epigastric area. There is no guarding or rebound.     Hernia: No hernia is present. There is no hernia in the umbilical area.     Comments: Liver palpable laterally  Skin:    General: Skin is warm and dry.     Capillary Refill: Capillary refill takes 2 to 3 seconds.     Coloration: Skin is not cyanotic, mottled or pale.  Neurological:     General: No focal deficit present.     Mental Status: She is alert and oriented to person, place, and time.     Cranial Nerves: No cranial nerve deficit.     Motor: No weakness.  Psychiatric:        Mood and Affect: Mood normal. Mood is not anxious or depressed.      Assessment/Plan: Acute calculous cholecystitis Metastatic rectal cancer DM with hyperglycemia Hyponatremia Hypochloremia Chronic pain syndrome COPD  IV antibiotics Pain control Medical optimization of DM and electrolytes   Seems like oligometastatic disease from rectal cancer is improving significantly and resection or primary could be in the future depending on  next set of staging studies/circulating tumor DNA.   Patient has hepatic steatosis and enlargement of right hepatic lobe, but no evidence of  cirrhosis.   This would make surgery difficult, but not necessarily prohibitive.    Depending on labs, potential surgery Thursday or Friday.     Lockie Rima 11/23/2023, 8:22 PM

## 2023-11-23 NOTE — ED Provider Notes (Signed)
 [place] [facility] AT [facility] Provider Note   CSN: [id number] Arrival date & time: 11/23/23  1538     History  Chief Complaint  Patient presents with   Abdominal Pain    Radiates to back    ZARAI MALONY is a 58 y.o. female.  Patient with history of CHF, COPD on 2L [place] at baseline, diabetes, hypertension, hyperlipidemia, metastatic rectal cancer currently on radiation returns today with complaints of abdominal pain.  She states the same is in the epigastric region and right upper quadrant and radiates to her back.  Symptoms have been present for 3 days now.  She has been taking her prescribed oxycodone  without any relief.  She was here for same and had a CT scan that was normal and was discharged home.  Today, states that she is having several episodes of nausea and vomiting as well as worsening pain and so returns for evaluation.  She denies any history of similar pain previously.  No history of abdominal surgeries.  No fevers or chills.  No chest pain or shortness of breath.  The history is provided by the patient. No language interpreter was used.  Abdominal Pain Associated symptoms: nausea and vomiting        Home Medications Prior to Admission medications   Medication Sig Start Date End Date Taking? Authorizing Provider  albuterol  (VENTOLIN  HFA) 108 (90 Base) MCG/ACT inhaler Inhale 2 puffs into the lungs every 6 (six) hours as needed for wheezing or shortness of breath.     [provider]  amoxicillin -clavulanate (AUGMENTIN ) 875-125 MG tablet Take 1 tablet by mouth 2 (two) times daily. 09/08/23   Sonja [place], MD  Blood Glucose Monitoring Suppl (ACCU-CHEK AVIVA PLUS) w/Device KIT See admin instructions. 10/20/17   [provider]  busPIRone  (BUSPAR ) 30 MG tablet Take 30 mg by mouth 2 (two) times daily. PER PT STATES HER DOSE WAS INCREASED 02/14/20   [provider]  capecitabine  (XELODA ) 500 MG tablet Take 3 tablets (1,500 mg  total) by mouth 2 (two) times daily after a meal. Take on days of radiation only, Monday through Friday 07/04/23   Sonja [place], MD  clonazePAM  (KLONOPIN ) 0.5 MG tablet Take 0.5 mg by mouth at bedtime as needed for anxiety. 03/12/20   [provider]  cyclobenzaprine  (FLEXERIL ) 10 MG tablet Take 1 tablet (10 mg total) by mouth 3 (three) times daily as needed for muscle spasms. 10/27/23   Pickenpack-Cousar, Athena N, NP  diltiazem  (CARDIZEM  CD) 120 MG 24 hr capsule TAKE 1 CAPSULE BY MOUTH EVERY DAY 04/22/23   Gollan, Timothy J, MD  diltiazem  (CARDIZEM ) 30 MG tablet TAKE 1 TABLET(30 MG) BY MOUTH THREE TIMES DAILY AS NEEDED 06/20/23   Gollan, Timothy J, MD  DULoxetine  (CYMBALTA ) 60 MG capsule Take 60 mg by mouth 2 (two) times daily.     [provider]  ezetimibe  (ZETIA ) 10 MG tablet Take 10 mg by mouth at bedtime. 02/16/23   [provider]  fluconazole  (DIFLUCAN ) 100 MG tablet Take 1 tablet (100 mg total) by mouth daily. For antibiotics induced vaginitis 09/08/23   Sonja , MD  Fluticasone -Salmeterol (ADVAIR) 250-50 MCG/DOSE AEPB Inhale 1 puff into the lungs 2 (two) times daily.    [provider]  furosemide  (LASIX ) 40 MG tablet TAKE 1 TABLET BY MOUTH EVERY DAY. MAY TAKE ADDITIONAL TABLET IN THE AM FOR WEIGHT GAIN AS NEEDED 03/10/23   Gollan, Timothy J, MD  JARDIANCE 25 MG TABS tablet  Take 25 mg by mouth daily. 08/18/21   [provider]  lidocaine -prilocaine  (EMLA ) cream Apply to affected area once 02/17/23   Sonja [place], MD  magic mouthwash (nystatin , lidocaine , diphenhydrAMINE, alum & mag hydroxide) suspension Take 5 mLs by mouth 4 (four) times daily as needed for mouth pain. 04/14/23   Sonja [place], MD  metFORMIN  (GLUCOPHAGE -XR) 500 MG 24 hr tablet Take 1,000 mg by mouth 2 (two) times daily. 10/21/22   [provider]  ondansetron  (ZOFRAN ) 8 MG tablet Take 1 tablet (8 mg total) by mouth every 8 (eight) hours as needed for nausea, vomiting or refractory nausea /  vomiting. Start on the third day after chemotherapy. 02/17/23   Sonja [place], MD  Oxycodone  HCl 10 MG TABS Take 1 tablet (10 mg total) by mouth 3 (three) times daily as needed. 10/27/23   Pickenpack-Cousar, Giles [place], NP  OXYGEN Inhale 2 L into the lungs at bedtime. Or as needed throughout the day if short of breath    [provider]  pantoprazole  (PROTONIX ) 40 MG tablet Take 1 tablet (40 mg total) by mouth daily. 05/04/18   Dannial Duty, MD  potassium chloride  SA (KLOR-CON ) 20 MEQ tablet Take 20 mEq by mouth 2 (two) times daily. 02/15/20   [provider]  pregabalin  (LYRICA ) 150 MG capsule Take 1 capsule (150 mg total) by mouth 3 (three) times daily. 09/21/23   Pickenpack-Cousar, Athena N, NP  prochlorperazine  (COMPAZINE ) 10 MG tablet Take 1 tablet (10 mg total) by mouth every 6 (six) hours as needed for nausea or vomiting. 02/17/23   Sonja [place], MD  spironolactone  (ALDACTONE ) 25 MG tablet TAKE 1 TABLET(25 MG) BY MOUTH EVERY MORNING 05/23/23   Gollan, Timothy J, MD  sucralfate  (CARAFATE ) 1 G tablet Take 1 tablet (1 g total) by mouth 4 (four) times daily -  with meals and at bedtime. Patient taking differently: Take 1 g by mouth 2 (two) times daily. 06/23/15   Renold Cashing, MD  tiotropium (SPIRIVA ) 18 MCG inhalation capsule Place 18 mcg into inhaler and inhale daily.    [provider]  Varenicline  Tartrate, Starter, 0.5 MG X 11 & 1 MG X 42 TBPK See admin instructions. follow package directions 02/16/23   [provider]  zolpidem (AMBIEN) 10 MG tablet Take 10 mg by mouth at bedtime as needed for sleep. 12/01/22   [provider]      Allergies    Tramadol and Penicillins    Review of Systems   Review of Systems  Gastrointestinal:  Positive for abdominal pain, nausea and vomiting.  All other systems reviewed and are negative.   Physical Exam Updated Vital Signs BP (!) 177/91   Pulse (!) 103   Temp 98.2 F (36.8 C) (Oral)   Resp 20   LMP 03/13/2015  (Approximate) Comment: more than 2 years ago  SpO2 99%  Physical Exam Vitals and nursing note reviewed.  Constitutional:      General: She is not in acute distress.    Appearance: Normal appearance. She is normal weight. She is not ill-appearing, toxic-appearing or diaphoretic.  HENT:     Head: Normocephalic and atraumatic.  Cardiovascular:     Rate and Rhythm: Normal rate.  Pulmonary:     Effort: Pulmonary effort is normal. No respiratory distress.  Abdominal:     General: Abdomen is flat.     Palpations: Abdomen is soft.     Tenderness: There is abdominal tenderness in the right upper quadrant. Positive  signs include Murphy's sign.  Musculoskeletal:        General: Normal range of motion.     Cervical back: Normal range of motion.  Skin:    General: Skin is warm and dry.  Neurological:     General: No focal deficit present.     Mental Status: She is alert.  Psychiatric:        Mood and Affect: Mood normal.        Behavior: Behavior normal.     ED Results / Procedures / Treatments   Labs (all labs ordered are listed, but only abnormal results are displayed) Labs Reviewed  CBC WITH DIFFERENTIAL/PLATELET - Abnormal; Notable for the following components:      Result Value   WBC 17.2 (*)    Hemoglobin 15.1 (*)    RDW 16.8 (*)    Neutro Abs 15.5 (*)    Lymphs Abs 0.5 (*)    Abs Immature Granulocytes 0.13 (*)    All other components within normal limits  COMPREHENSIVE METABOLIC PANEL WITH GFR - Abnormal; Notable for the following components:   Sodium 126 (*)    Chloride 90 (*)    Glucose, Bld 195 (*)    Total Protein 8.4 (*)    All other components within normal limits  LIPASE, BLOOD  URINALYSIS, W/ REFLEX TO CULTURE (INFECTION SUSPECTED)  HEMOGLOBIN A1C  HIV ANTIBODY (ROUTINE TESTING W REFLEX)  BASIC METABOLIC PANEL WITH GFR  CBC  TROPONIN I (HIGH SENSITIVITY)  TROPONIN I (HIGH SENSITIVITY)    EKG None  Radiology US  Abdomen Limited RUQ (LIVER/GB) Result  Date: 11/23/2023 CLINICAL DATA:  Right upper quadrant pain EXAM: ULTRASOUND ABDOMEN LIMITED RIGHT UPPER QUADRANT COMPARISON:  CT abdomen and pelvis 11/22/2023. Abdominal ultrasound 08/12/2018. FINDINGS: Gallbladder: Gallbladder wall thickened measuring 4 mm. There is a small amount of pericholecystic fluid. Gallbladder calculi are seen measuring up to 6 mm. Sonographic Abigail Abler sign is negative. Common bile duct: Diameter: 3 mm Liver: No focal lesion identified. Increased in parenchymal echogenicity. Portal vein is patent on color Doppler imaging with normal direction of blood flow towards the liver. Other: None. IMPRESSION: 1. Cholelithiasis with gallbladder wall thickening and pericholecystic fluid. Findings are concerning for acute cholecystitis. 2. Hepatic steatosis. Electronically Signed   By: Tyron Gallon M.D.   On: 11/23/2023 18:12   CT ABDOMEN PELVIS W CONTRAST Result Date: 11/22/2023 CLINICAL DATA:  Acute nonlocalized abdominal pain. History of lung and colon cancer. History of pancreatitis. EXAM: CT ABDOMEN AND PELVIS WITH CONTRAST TECHNIQUE: Multidetector CT imaging of the abdomen and pelvis was performed using the standard protocol following bolus administration of intravenous contrast. RADIATION DOSE REDUCTION: This exam was performed according to the departmental dose-optimization program which includes automated exposure control, adjustment of the mA and/or kV according to patient size and/or use of iterative reconstruction technique. CONTRAST:  OMNIPAQUE  IOHEXOL  300 MG/ML  SOLN COMPARISON:  CT chest abdomen pelvis 11/01/2023 FINDINGS: Lower chest: No acute abnormality. Hepatobiliary: Hepatic steatosis. No focal hepatic lesion. Cholelithiasis without evidence of acute cholecystitis. No biliary dilation Pancreas: Unremarkable. No pancreatic ductal dilatation or surrounding inflammatory changes. Spleen: Unremarkable. Adrenals/Urinary Tract: Nodular thickening in both adrenal glands is stable  dating back to 01/03/2023. No urinary calculi or hydronephrosis. Unremarkable bladder. Stomach/Bowel: Asymmetric wall thickening about the rectum greater on the left (circa series 2/image 69) is grossly similar to 11/01/2023. There is adjacent fat stranding and trace fluid. Otherwise no thickening of the wall of the small or large bowel. Stomach is  within normal limits. The appendix is not visualized. No bowel obstruction. Vascular/Lymphatic: Aortic atherosclerosis. No enlarged abdominal or pelvic lymph nodes. Reproductive: Uterus and bilateral adnexa are unremarkable. Other: No free intraperitoneal air.  No abscess. Musculoskeletal: No acute fracture or destructive osseous lesion. IMPRESSION: 1. Stable asymmetric wall thickening about the rectum greater on the left with adjacent spiculation and edema. 2. Hepatic steatosis. 3. Cholelithiasis without evidence of acute cholecystitis. 4. Aortic Atherosclerosis (ICD10-I70.0). Electronically Signed   By: Rozell Cornet M.D.   On: 11/22/2023 23:45    Procedures Procedures    Medications Ordered in ED Medications  ceFEPIme  (MAXIPIME ) 2 g in sodium chloride  0.9 % 100 mL IVPB (has no administration in time range)    And  metroNIDAZOLE  (FLAGYL ) IVPB 500 mg (has no administration in time range)  HYDROmorphone  (DILAUDID ) injection 0.5 mg (has no administration in time range)  ondansetron  (ZOFRAN ) injection 4 mg (has no administration in time range)  HYDROmorphone  (DILAUDID ) injection 0.5 mg (0.5 mg Intravenous Given 11/23/23 1723)  ondansetron  (ZOFRAN ) injection 4 mg (4 mg Intravenous Given 11/23/23 1724)  sodium chloride  0.9 % bolus 1,000 mL (1,000 mLs Intravenous New Bag/Given 11/23/23 1715)    ED Course/ Medical Decision Making/ A&P                                 Medical Decision Making Amount and/or Complexity of Data Reviewed Radiology: ordered.  Risk Prescription drug management.   This patient is a 58 y.o. female who presents to the ED for  concern of abdominal pain, this involves an extensive number of treatment options, and is a complaint that carries with it a high risk of complications and morbidity. The emergent differential diagnosis prior to evaluation includes, but is not limited to,  Glabladder disease, PUD, Acute Hepatitis, Pancreatitis, pyelonephritis, Pneumonia, Lower lobe PE/Infarct, Kidney stone, GERD, retrocecal appendicitis, Fitz-Hugh-Curtis syndrome, AAA, MI, Zoster.  This is not an exhaustive differential.   Past Medical History / Co-morbidities / Social History:  has a past medical history of (HFpEF) heart failure with preserved ejection fraction (HCC), Acute pancreatitis (08/13/2018), Anxiety, Arthritis, Asthma, Bell's palsy, Bronchitis, Cancer (HCC) (12/2022), CHF (congestive heart failure) (HCC), COPD (chronic obstructive pulmonary disease) (HCC), Depression, Diabetes mellitus without complication (HCC), Dyspnea, Fatty liver, Gall stones, Gastric ulcer, Hyperlipidemia, Hypertension, Lung nodule (12/2022), OSA (obstructive sleep apnea), Pancreatitis, Shoulder injury, and Smoker.  Additional history: Chart reviewed. Pertinent results include: seen yesterday for same, had labs that showed WBC 11.4, otherwise unremarkable. CT showed  1. Stable asymmetric wall thickening about the rectum greater on the left with adjacent spiculation and edema. 2. Hepatic steatosis. 3. Cholelithiasis without evidence of acute cholecystitis. 4. Aortic Atherosclerosis  Looks like patient was offered RUQ US  but preferred to go home and follow-up and was subsequently discharged  Physical Exam: Physical exam performed. The pertinent findings include: chronically unwell appearing, on 2L O2 vis [place] per her baseline. RUQ TTP with positive Murphy's sign  Lab Tests: I ordered, and personally interpreted labs.  The pertinent results include:  WBC 17.2, Na 126, chloride 90, glucose 195   Imaging Studies: I ordered imaging studies including  RUQ US . I independently visualized and interpreted imaging which showed   1. Cholelithiasis with gallbladder wall thickening and pericholecystic fluid. Findings are concerning for acute cholecystitis. 2. Hepatic steatosis.  I agree with the radiologist interpretation.   Cardiac Monitoring:  The patient was maintained on a cardiac monitor.  Cardiac monitor showed an underlying rhythm of: sinus tachycardia. I agree with this interpretation.   Medications: I ordered medication including dilaudid , zofran , fluids, cefepime , flagyl   for pain, dehydration, acute cholecystitis. Reevaluation of the patient after these medicines showed that the patient improved. I have reviewed the patients home medicines and have made adjustments as needed.  Consultations Obtained: I requested consultation with the surgery on call Dr. Cherlynn Cornfield,  and discussed lab and imaging findings as well as pertinent plan - they recommend: admit to medicine, they will see the patient   Disposition: After consideration of the diagnostic results and the patients response to treatment, I feel that patient will require admission for acute cholecystitis. Discussed same with patient who is understanding and in agreement.   Discussed patient with hospitalist Dr. Dahal who accepts patient for admission.   Final Clinical Impression(s) / ED Diagnoses Final diagnoses:  Acute cholecystitis  Hyponatremia    Rx / DC Orders ED Discharge Orders     None         Koula Venier A, PA-C 11/23/23 2100    Mozell Arias, MD 11/25/23 1040

## 2023-11-23 NOTE — H&P (Signed)
 [facility] History and Physical  Erica [name]:[id number] DOB: 1966/03/08 DOA: 11/23/2023 PCP: System, Provider Not In  Presented from: Home Chief Complaint: Abnormal brain  History of Present Illness: Erica [name] is a 58 y.o. female with long h/o rectal cancer with oligometastatic disease on treatment with Xeloda  and radiation; chronic pain syndrome, gastric ulcers, DM2, HTN, HLD, OSA, CHF, COPD, chronic 1 pack/day smoking, fatty liver, anxiety/depression, arthritis. Patient presented to the ED today with complaint of epigastric pain radiating around back for 24-hour duration not responding to oxycodone  as an outpatient.  She was seen yesterday for the same, underwent CT scan showed cholelithiasis without cholecystitis.  Patient was discharged home only to return today with worsening symptoms.  In the ED, patient was afebrile, heart rate of 100s, blood pressure in 170s, breathing on room air Labs with WBC count elevated 17.2, hemoglobin 15.1, sodium 126, glucose 195 Urinalysis clear yellow with trace leukocytes, no bacteria  Right upper quadrant ultrasound showed hepatic steatosis, cholelithiasis with gallbladder wall thickening and pericholecystic fluid, findings are concerning for acute cholecystitis.  General surgery was consulted Hospitalist service was consulted for inpatient management.  At the time of my evaluation, patient was lying on bed.  Pain controlled.  Not in distress. History reviewed in detail as above.  Review of Systems:  All systems were reviewed and were negative unless otherwise mentioned in the HPI   Past medical history: Past Medical History:  Diagnosis Date   (HFpEF) heart failure with preserved ejection fraction (HCC)    a. 05/2015 Echo: EF 60-65%, no rwma, PASP .   Acute pancreatitis 08/13/2018   Anxiety    Arthritis    Asthma    Bell's palsy    no deficit   Bronchitis    Cancer (HCC) 12/2022   rectal cancer   CHF (congestive  heart failure) (HCC)    COPD (chronic obstructive pulmonary disease) (HCC)    Depression    Diabetes mellitus without complication (HCC)    type II 03/2017   Dyspnea    Fatty liver    per patient   Gall stones    per patient   Gastric ulcer    Hyperlipidemia    Hypertension    Lung nodule 12/2022   upper left   OSA (obstructive sleep apnea)    a. did not tolerate CPAP. On oxygen 2L via [place]   Pancreatitis    07/2018   Shoulder injury    6/19   Smoker     Past surgical history: Past Surgical History:  Procedure Laterality Date   BRONCHIAL NEEDLE ASPIRATION BIOPSY  01/27/2023   Procedure: BRONCHIAL NEEDLE ASPIRATION BIOPSIES;  Surgeon: Vergia Glasgow, MD;  Location: [facility] ENDOSCOPY;  Service: Pulmonary;;   COLONOSCOPY WITH PROPOFOL  N/A 12/15/2022   Procedure: COLONOSCOPY WITH PROPOFOL ;  Surgeon: Luke Salaam, MD;  Location: [facility] ENDOSCOPY;  Service: Gastroenterology;  Laterality: N/A;   COLONOSCOPY WITH PROPOFOL  N/A 12/16/2022   Procedure: COLONOSCOPY WITH PROPOFOL ;  Surgeon: Luke Salaam, MD;  Location: [facility] ENDOSCOPY;  Service: Gastroenterology;  Laterality: N/A;   FLEXIBLE BRONCHOSCOPY N/A 06/20/2015   Procedure: FLEXIBLE BRONCHOSCOPY;  Surgeon: Enos Harts, MD;  Location: [facility] ORS;  Service: Pulmonary;  Laterality: N/A;   IR IMAGING GUIDED PORT INSERTION  02/25/2023   KNEE SURGERY Left    8 knee surgeries    Social History:  reports that she has been smoking cigarettes. She has a 138 pack-year smoking history. She has never used smokeless tobacco. She reports current  alcohol use. She reports that she does not use drugs.  Allergies:  Allergies  Allergen Reactions   Tramadol Other (See Comments) and Palpitations    Heart Skipping Beats Heart palpatations   Penicillins Rash    Has patient had a PCN reaction causing immediate rash, facial/tongue/throat swelling, SOB or lightheadedness with hypotension: Yes Has patient had a PCN reaction causing severe rash involving mucus  membranes or skin necrosis: No Has patient had a PCN reaction that required hospitalization: No Has patient had a PCN reaction occurring within the last 10 years: No If all of the above answers are "NO", then may proceed with Cephalosporin use.   Tramadol and Penicillins   Family history:  Family History  Problem Relation Age of Onset   Breast cancer Mother        early 39's   Lung cancer Father    Brain cancer Father    Bone cancer Father    Colon cancer Maternal Aunt    Cancer Maternal Uncle        liver?   Diabetes Maternal Grandmother    Asthma Other      Physical Exam: Vitals:   11/23/23 1958 11/23/23 2046 11/23/23 2142 11/23/23 2155  BP:  (!) 172/83 (!) 172/90   Pulse:  (!) 108 (!) 114   Resp:  18 19   Temp: 98.8 F (37.1 C) 99.3 F (37.4 C) 98.9 F (37.2 C)   TempSrc: Oral     SpO2:  99% 94%   Weight:    77.1 kg  Height:    5\' 8"  (1.727 m)   Wt Readings from Last 3 Encounters:  11/23/23 77.1 kg  11/17/23 77.5 kg  10/27/23 71.5 kg   Body mass index is 25.84 kg/m.  General exam: Pleasant, middle-aged Caucasian female Skin: No rashes, lesions or ulcers. HEENT: Atraumatic, normocephalic, no obvious bleeding Lungs: Clear to auscultation bilaterally, has a port on right anterior chest wall CVS: S1, S2, no murmur,   GI/Abd: Soft, tender right upper quadrant, distended (from hepatomegaly per patient), bowel sound present,   CNS: Alert, awake, oriented x 3 Psychiatry: Seems anxious Extremities: No pedal edema, no calf tenderness,     ----------------------------------------------------------------------------------------------------------------------------------------- ----------------------------------------------------------------------------------------------------------------------------------------- -----------------------------------------------------------------------------------------------------------------------------------------  Assessment/Plan: Principal Problem:   Acute cholecystitis  Acute calculus cholecystitis Presented with right upper quadrant abdominal pain, tachycardia Imaging showed stable cholelithiasis and acute cholecystitis WBC count elevated. Started on IV antibiotics - IV cefepime  and IV Flagyl.  Continue the same Seen by general surgery Dr. Cherlynn Cornfield Tentative plan of surgery on Thursday or Friday. Monitor temperature and WBC trend Recent Labs  Lab 11/22/23 2005 11/23/23 1609  WBC 11.4* 17.2*   Hyponatremia Sodium level dropped from 136 to 126 in 24 hours.  Not sure if it is without error. No vomiting or diarrhea. Start NS at 75 mL/h tonight.  Repeat labs tomorrow. Recent Labs  Lab 11/22/23 2005 11/23/23 1609  NA 136 126*   CHF Hypertension Blood pressure elevated to 170s in the ED Most recent echo from 2020 with EF 55 to 60%. PTA meds- Cardizem , Lasix , Aldactone  Resume Cardizem .  Keep Lasix  and Aldactone  on hold for now  Type 2 diabetes mellitus Peripheral neuropathy A1c 6 from 2020.  Repeat A1c PTA meds-Jardiance, metformin  Keep oral meds on hold.  Start SSI/Accu-Cheks Lyrica  for neuropathy Recent Labs  Lab 11/23/23 2145  GLUCAP 138*   HLD Zetia  Rectal cancer with oligometastatic disease Ongoing treatment with Xeloda  and radiation Follows up with Dr. Maryalice Smaller  H/o  gastric ulcers PPI, ?Carafate   Hepatic steatosis  Chronic pain Oxycodone  10 mg TID PRN  Anxiety/depression PTA meds- BuSpar, Cymbalta , Klonopin,  COPD Chronic daily smoker ??Chronic  hypoxic respiratory failure Continues to smoke 1 pack/day States she is intermittently on oxygen at home  Home med list has not been obtained/updated by [facility] at the time of admission.  Please double check admission reconciliation.   Mobility: encourage ambulation  Goals of care:   Code Status: Full Code    DVT prophylaxis:  enoxaparin  (LOVENOX ) injection 40 mg Start: 11/24/23 2000   Antimicrobials: IV Cefepime  and IV Flagyl Fluid: NS@75ml /hr Consultants: surgery Family Communication: none  Status: Obv Level of care:  Med-Surg   Patient is from: home Anticipated d/c to: home in next few days  Diet: Diet Order             Diet NPO time specified Except for: Sips with Meds  Diet effective now                    ------------------------------------------------------------------------------------- Severity of Illness: The appropriate patient status for this patient is OBSERVATION. Observation status is judged to be reasonable and necessary in order to provide the required intensity of service to ensure the patient's safety. The patient's presenting symptoms, physical exam findings, and initial radiographic and laboratory data in the context of their medical condition is felt to place them at decreased risk for further clinical deterioration. Furthermore, it is anticipated that the patient will be medically stable for discharge from the hospital within 2 midnights of admission.  -------------------------------------------------------------------------------------   Home Meds: Prior to Admission medications   Medication Sig Start Date End Date Taking? Authorizing Provider  albuterol  (VENTOLIN  HFA) 108 (90 Base) MCG/ACT inhaler Inhale 2 puffs into the lungs every 6 (six) hours as needed for wheezing or shortness of breath.     [provider]  amoxicillin -clavulanate (AUGMENTIN ) 875-125 MG tablet Take 1 tablet by mouth 2 (two) times daily. 09/08/23   Sonja [place],  MD  Blood Glucose Monitoring Suppl (ACCU-CHEK AVIVA PLUS) w/Device KIT See admin instructions. 10/20/17   [provider]  busPIRone (BUSPAR) 30 MG tablet Take 30 mg by mouth 2 (two) times daily. PER PT STATES HER DOSE WAS INCREASED 02/14/20   [provider]  capecitabine  (XELODA ) 500 MG tablet Take 3 tablets (1,500 mg total) by mouth 2 (two) times daily after a meal. Take on days of radiation only, Monday through Friday 07/04/23   Sonja Irwin, MD  clonazePAM (KLONOPIN) 0.5 MG tablet Take 0.5 mg by mouth at bedtime as needed for anxiety. 03/12/20   [provider]  cyclobenzaprine  (FLEXERIL ) 10 MG tablet Take 1 tablet (10 mg total) by mouth 3 (three) times daily as needed for muscle spasms. 10/27/23   Pickenpack-Cousar, Athena N, NP  diltiazem  (CARDIZEM  CD) 120 MG 24 hr capsule TAKE 1 CAPSULE BY MOUTH EVERY DAY 04/22/23   Gollan, Timothy J, MD  diltiazem  (CARDIZEM ) 30 MG tablet TAKE 1 TABLET(30 MG) BY MOUTH THREE TIMES DAILY AS NEEDED 06/20/23   Gollan, Timothy J, MD  DULoxetine  (CYMBALTA ) 60 MG capsule Take 60 mg by mouth 2 (two) times daily.     [provider]  ezetimibe (ZETIA) 10 MG tablet Take 10 mg by mouth at bedtime. 02/16/23   [provider]  fluconazole  (DIFLUCAN ) 100 MG tablet Take 1 tablet (100 mg total) by mouth daily. For antibiotics induced vaginitis 09/08/23   Sonja Monona, MD  Fluticasone-Salmeterol (ADVAIR) 250-50 MCG/DOSE  AEPB Inhale 1 puff into the lungs 2 (two) times daily.    [provider]  furosemide  (LASIX ) 40 MG tablet TAKE 1 TABLET BY MOUTH EVERY DAY. MAY TAKE ADDITIONAL TABLET IN THE AM FOR WEIGHT GAIN AS NEEDED 03/10/23   Gollan, Timothy J, MD  JARDIANCE 25 MG TABS tablet Take 25 mg by mouth daily. 08/18/21   [provider]  lidocaine -prilocaine  (EMLA ) cream Apply to affected area once 02/17/23   Sonja Fowler, MD  magic mouthwash (nystatin , lidocaine , diphenhydrAMINE, alum & mag hydroxide) suspension Take 5 mLs by mouth 4  (four) times daily as needed for mouth pain. 04/14/23   Sonja [place], MD  metFORMIN  (GLUCOPHAGE -XR) 500 MG 24 hr tablet Take 1,000 mg by mouth 2 (two) times daily. 10/21/22   [provider]  ondansetron  (ZOFRAN ) 8 MG tablet Take 1 tablet (8 mg total) by mouth every 8 (eight) hours as needed for nausea, vomiting or refractory nausea / vomiting. Start on the third day after chemotherapy. 02/17/23   Sonja Longfellow, MD  Oxycodone  HCl 10 MG TABS Take 1 tablet (10 mg total) by mouth 3 (three) times daily as needed. 10/27/23   Pickenpack-Cousar, Giles Labrum, NP  OXYGEN Inhale 2 L into the lungs at bedtime. Or as needed throughout the day if short of breath    [provider]  pantoprazole  (PROTONIX ) 40 MG tablet Take 1 tablet (40 mg total) by mouth daily. 05/04/18   Dannial Duty, MD  potassium chloride  SA (KLOR-CON ) 20 MEQ tablet Take 20 mEq by mouth 2 (two) times daily. 02/15/20   [provider]  pregabalin  (LYRICA ) 150 MG capsule Take 1 capsule (150 mg total) by mouth 3 (three) times daily. 09/21/23   Pickenpack-Cousar, Athena N, NP  prochlorperazine  (COMPAZINE ) 10 MG tablet Take 1 tablet (10 mg total) by mouth every 6 (six) hours as needed for nausea or vomiting. 02/17/23   Sonja [place], MD  spironolactone  (ALDACTONE ) 25 MG tablet TAKE 1 TABLET(25 MG) BY MOUTH EVERY MORNING 05/23/23   Gollan, Timothy J, MD  sucralfate  (CARAFATE ) 1 G tablet Take 1 tablet (1 g total) by mouth 4 (four) times daily -  with meals and at bedtime. Patient taking differently: Take 1 g by mouth 2 (two) times daily. 06/23/15   Renold Cashing, MD  tiotropium (SPIRIVA ) 18 MCG inhalation capsule Place 18 mcg into inhaler and inhale daily.    [provider]  Varenicline  Tartrate, Starter, 0.5 MG X 11 & 1 MG X 42 TBPK See admin instructions. follow package directions 02/16/23   [provider]  zolpidem (AMBIEN) 10 MG tablet Take 10 mg by mouth at bedtime as needed for sleep. 12/01/22   [provider]     Labs on Admission:   CBC: Recent Labs  Lab 11/22/23 2005 11/23/23 1609  WBC 11.4* 17.2*  NEUTROABS  --  15.5*  HGB 13.5 15.1*  HCT 43.1 45.6  MCV 102.1* 96.2  PLT 231 270    Basic Metabolic Panel: Recent Labs  Lab 11/22/23 2005 11/23/23 1609  NA 136 126*  K 3.8 4.0  CL 99 90*  CO2 21* 22  GLUCOSE 158* 195*  BUN 9 11  CREATININE 0.65 0.78  CALCIUM  9.4 9.5    Liver Function Tests: Recent Labs  Lab 11/22/23 2005 11/23/23 1609  AST 27 25  ALT 19 20  ALKPHOS 95 111  BILITOT 0.4 1.2  PROT 7.7 8.4*  ALBUMIN 3.6 4.0   Recent Labs  Lab 11/22/23  2005 11/23/23 1609  LIPASE 23 25   No results for input(s): "AMMONIA" in the last 168 hours.  Cardiac Enzymes: No results for input(s): "CKTOTAL", "CKMB", "CKMBINDEX", "TROPONINI" in the last 168 hours.  BNP (last 3 results) No results for input(s): "BNP" in the last 8760 hours.  ProBNP (last 3 results) No results for input(s): "PROBNP" in the last 8760 hours.  CBG: Recent Labs  Lab 11/23/23 2145  GLUCAP 138*    Lipase     Component Value Date/Time   LIPASE 25 11/23/2023 1609   LIPASE 90 02/10/2013 1114     Urinalysis    Component Value Date/Time   COLORURINE YELLOW 11/22/2023 2343   APPEARANCEUR CLEAR 11/22/2023 2343   APPEARANCEUR Clear 02/26/2014 2019   LABSPEC 1.035 (H) 11/22/2023 2343   LABSPEC 1.014 02/26/2014 2019   PHURINE 5.0 11/22/2023 2343   GLUCOSEU >=500 (A) 11/22/2023 2343   GLUCOSEU Negative 02/26/2014 2019   HGBUR NEGATIVE 11/22/2023 2343   BILIRUBINUR NEGATIVE 11/22/2023 2343   BILIRUBINUR Negative 02/26/2014 2019   KETONESUR 5 (A) 11/22/2023 2343   PROTEINUR NEGATIVE 11/22/2023 2343   NITRITE NEGATIVE 11/22/2023 2343   LEUKOCYTESUR TRACE (A) 11/22/2023 2343   LEUKOCYTESUR Trace 02/26/2014 2019     Drugs of Abuse     Component Value Date/Time   LABOPIA POSITIVE (A) 04/08/2017 2150   COCAINSCRNUR NONE DETECTED 04/08/2017 2150   LABBENZ NONE DETECTED 04/08/2017 2150    AMPHETMU NONE DETECTED 04/08/2017 2150   THCU NONE DETECTED 04/08/2017 2150   LABBARB NONE DETECTED 04/08/2017 2150      Radiological Exams on Admission: US  Abdomen Limited RUQ (LIVER/GB) Result Date: 11/23/2023 CLINICAL DATA:  Right upper quadrant pain EXAM: ULTRASOUND ABDOMEN LIMITED RIGHT UPPER QUADRANT COMPARISON:  CT abdomen and pelvis 11/22/2023. Abdominal ultrasound 08/12/2018. FINDINGS: Gallbladder: Gallbladder wall thickened measuring 4 mm. There is a small amount of pericholecystic fluid. Gallbladder calculi are seen measuring up to 6 mm. Sonographic Abigail Abler sign is negative. Common bile duct: Diameter: 3 mm Liver: No focal lesion identified. Increased in parenchymal echogenicity. Portal vein is patent on color Doppler imaging with normal direction of blood flow towards the liver. Other: None. IMPRESSION: 1. Cholelithiasis with gallbladder wall thickening and pericholecystic fluid. Findings are concerning for acute cholecystitis. 2. Hepatic steatosis. Electronically Signed   By: Tyron Gallon M.D.   On: 11/23/2023 18:12   CT ABDOMEN PELVIS W CONTRAST Result Date: 11/22/2023 CLINICAL DATA:  Acute nonlocalized abdominal pain. History of lung and colon cancer. History of pancreatitis. EXAM: CT ABDOMEN AND PELVIS WITH CONTRAST TECHNIQUE: Multidetector CT imaging of the abdomen and pelvis was performed using the standard protocol following bolus administration of intravenous contrast. RADIATION DOSE REDUCTION: This exam was performed according to the departmental dose-optimization program which includes automated exposure control, adjustment of the mA and/or kV according to patient size and/or use of iterative reconstruction technique. CONTRAST:  OMNIPAQUE  IOHEXOL  300 MG/ML  SOLN COMPARISON:  CT chest abdomen pelvis 11/01/2023 FINDINGS: Lower chest: No acute abnormality. Hepatobiliary: Hepatic steatosis. No focal hepatic lesion. Cholelithiasis without evidence of acute cholecystitis. No biliary  dilation Pancreas: Unremarkable. No pancreatic ductal dilatation or surrounding inflammatory changes. Spleen: Unremarkable. Adrenals/Urinary Tract: Nodular thickening in both adrenal glands is stable dating back to 01/03/2023. No urinary calculi or hydronephrosis. Unremarkable bladder. Stomach/Bowel: Asymmetric wall thickening about the rectum greater on the left (circa series 2/image 69) is grossly similar to 11/01/2023. There is adjacent fat stranding and trace fluid. Otherwise no thickening of the wall of  the small or large bowel. Stomach is within normal limits. The appendix is not visualized. No bowel obstruction. Vascular/Lymphatic: Aortic atherosclerosis. No enlarged abdominal or pelvic lymph nodes. Reproductive: Uterus and bilateral adnexa are unremarkable. Other: No free intraperitoneal air.  No abscess. Musculoskeletal: No acute fracture or destructive osseous lesion. IMPRESSION: 1. Stable asymmetric wall thickening about the rectum greater on the left with adjacent spiculation and edema. 2. Hepatic steatosis. 3. Cholelithiasis without evidence of acute cholecystitis. 4. Aortic Atherosclerosis (ICD10-I70.0). Electronically Signed   By: Rozell Cornet M.D.   On: 11/22/2023 23:45     Signed, Hoyt Macleod, MD [facility] 11/23/2023

## 2023-11-24 ENCOUNTER — Inpatient Hospital Stay

## 2023-11-24 ENCOUNTER — Inpatient Hospital Stay: Admitting: Nurse Practitioner

## 2023-11-24 DIAGNOSIS — E785 Hyperlipidemia, unspecified: Secondary | ICD-10-CM | POA: Diagnosis present

## 2023-11-24 DIAGNOSIS — J9611 Chronic respiratory failure with hypoxia: Secondary | ICD-10-CM | POA: Diagnosis present

## 2023-11-24 DIAGNOSIS — J449 Chronic obstructive pulmonary disease, unspecified: Secondary | ICD-10-CM | POA: Diagnosis not present

## 2023-11-24 DIAGNOSIS — E1165 Type 2 diabetes mellitus with hyperglycemia: Secondary | ICD-10-CM | POA: Diagnosis present

## 2023-11-24 DIAGNOSIS — K760 Fatty (change of) liver, not elsewhere classified: Secondary | ICD-10-CM | POA: Diagnosis present

## 2023-11-24 DIAGNOSIS — Z01811 Encounter for preprocedural respiratory examination: Secondary | ICD-10-CM

## 2023-11-24 DIAGNOSIS — F17210 Nicotine dependence, cigarettes, uncomplicated: Secondary | ICD-10-CM | POA: Diagnosis present

## 2023-11-24 DIAGNOSIS — J440 Chronic obstructive pulmonary disease with acute lower respiratory infection: Secondary | ICD-10-CM | POA: Diagnosis present

## 2023-11-24 DIAGNOSIS — K8000 Calculus of gallbladder with acute cholecystitis without obstruction: Secondary | ICD-10-CM | POA: Diagnosis present

## 2023-11-24 DIAGNOSIS — G4733 Obstructive sleep apnea (adult) (pediatric): Secondary | ICD-10-CM | POA: Diagnosis present

## 2023-11-24 DIAGNOSIS — C7800 Secondary malignant neoplasm of unspecified lung: Secondary | ICD-10-CM | POA: Diagnosis present

## 2023-11-24 DIAGNOSIS — E876 Hypokalemia: Secondary | ICD-10-CM | POA: Diagnosis present

## 2023-11-24 DIAGNOSIS — E66811 Obesity, class 1: Secondary | ICD-10-CM | POA: Diagnosis present

## 2023-11-24 DIAGNOSIS — I10 Essential (primary) hypertension: Secondary | ICD-10-CM | POA: Diagnosis not present

## 2023-11-24 DIAGNOSIS — R109 Unspecified abdominal pain: Secondary | ICD-10-CM | POA: Diagnosis present

## 2023-11-24 DIAGNOSIS — E119 Type 2 diabetes mellitus without complications: Secondary | ICD-10-CM | POA: Diagnosis not present

## 2023-11-24 DIAGNOSIS — F32A Depression, unspecified: Secondary | ICD-10-CM | POA: Diagnosis present

## 2023-11-24 DIAGNOSIS — K810 Acute cholecystitis: Secondary | ICD-10-CM | POA: Diagnosis not present

## 2023-11-24 DIAGNOSIS — I5032 Chronic diastolic (congestive) heart failure: Secondary | ICD-10-CM | POA: Diagnosis present

## 2023-11-24 DIAGNOSIS — E871 Hypo-osmolality and hyponatremia: Secondary | ICD-10-CM | POA: Diagnosis not present

## 2023-11-24 DIAGNOSIS — J441 Chronic obstructive pulmonary disease with (acute) exacerbation: Secondary | ICD-10-CM | POA: Diagnosis present

## 2023-11-24 DIAGNOSIS — E878 Other disorders of electrolyte and fluid balance, not elsewhere classified: Secondary | ICD-10-CM | POA: Diagnosis present

## 2023-11-24 DIAGNOSIS — I5031 Acute diastolic (congestive) heart failure: Secondary | ICD-10-CM | POA: Diagnosis not present

## 2023-11-24 DIAGNOSIS — E1142 Type 2 diabetes mellitus with diabetic polyneuropathy: Secondary | ICD-10-CM | POA: Diagnosis present

## 2023-11-24 DIAGNOSIS — R6510 Systemic inflammatory response syndrome (SIRS) of non-infectious origin without acute organ dysfunction: Secondary | ICD-10-CM | POA: Diagnosis present

## 2023-11-24 DIAGNOSIS — I2510 Atherosclerotic heart disease of native coronary artery without angina pectoris: Secondary | ICD-10-CM | POA: Diagnosis present

## 2023-11-24 DIAGNOSIS — G893 Neoplasm related pain (acute) (chronic): Secondary | ICD-10-CM | POA: Diagnosis not present

## 2023-11-24 DIAGNOSIS — I110 Hypertensive heart disease with heart failure: Secondary | ICD-10-CM | POA: Diagnosis present

## 2023-11-24 DIAGNOSIS — C20 Malignant neoplasm of rectum: Secondary | ICD-10-CM | POA: Diagnosis present

## 2023-11-24 DIAGNOSIS — Z01810 Encounter for preprocedural cardiovascular examination: Secondary | ICD-10-CM | POA: Diagnosis not present

## 2023-11-24 DIAGNOSIS — G894 Chronic pain syndrome: Secondary | ICD-10-CM | POA: Diagnosis present

## 2023-11-24 DIAGNOSIS — J209 Acute bronchitis, unspecified: Secondary | ICD-10-CM

## 2023-11-24 DIAGNOSIS — F419 Anxiety disorder, unspecified: Secondary | ICD-10-CM | POA: Diagnosis present

## 2023-11-24 DIAGNOSIS — Z794 Long term (current) use of insulin: Secondary | ICD-10-CM | POA: Diagnosis not present

## 2023-11-24 DIAGNOSIS — I700 Atherosclerosis of aorta: Secondary | ICD-10-CM | POA: Diagnosis present

## 2023-11-24 DIAGNOSIS — R079 Chest pain, unspecified: Secondary | ICD-10-CM | POA: Diagnosis not present

## 2023-11-24 LAB — CBC
HCT: 39.8 % (ref 36.0–46.0)
Hemoglobin: 13.1 g/dL (ref 12.0–15.0)
MCH: 31.5 pg (ref 26.0–34.0)
MCHC: 32.9 g/dL (ref 30.0–36.0)
MCV: 95.7 fL (ref 80.0–100.0)
Platelets: 200 10*3/uL (ref 150–400)
RBC: 4.16 MIL/uL (ref 3.87–5.11)
RDW: 17.2 % — ABNORMAL HIGH (ref 11.5–15.5)
WBC: 17.8 10*3/uL — ABNORMAL HIGH (ref 4.0–10.5)
nRBC: 0 % (ref 0.0–0.2)

## 2023-11-24 LAB — BASIC METABOLIC PANEL WITH GFR
Anion gap: 11 (ref 5–15)
BUN: 11 mg/dL (ref 6–20)
CO2: 24 mmol/L (ref 22–32)
Calcium: 8.7 mg/dL — ABNORMAL LOW (ref 8.9–10.3)
Chloride: 97 mmol/L — ABNORMAL LOW (ref 98–111)
Creatinine, Ser: 0.74 mg/dL (ref 0.44–1.00)
GFR, Estimated: >60 mL/min (ref >60)
Glucose, Bld: 126 mg/dL — ABNORMAL HIGH (ref 70–99)
Potassium: 3.4 mmol/L — ABNORMAL LOW (ref 3.5–5.1)
Sodium: 132 mmol/L — ABNORMAL LOW (ref 135–145)

## 2023-11-24 LAB — GLUCOSE, CAPILLARY
Glucose-Capillary: 132 mg/dL — ABNORMAL HIGH (ref 70–99)
Glucose-Capillary: 118 mg/dL — ABNORMAL HIGH (ref 70–99)
Glucose-Capillary: 149 mg/dL — ABNORMAL HIGH (ref 70–99)

## 2023-11-24 LAB — HIV ANTIBODY (ROUTINE TESTING W REFLEX): HIV Screen 4th Generation wRfx: NONREACTIVE

## 2023-11-24 MED ORDER — FLUTICASONE FUROATE-VILANTEROL 200-25 MCG/ACT IN AEPB
1.0000 | INHALATION_SPRAY | Freq: Every day | RESPIRATORY_TRACT | Status: DC
  Administered 2023-11-24: 1 via RESPIRATORY_TRACT
  Filled 2023-11-24: qty 28

## 2023-11-24 MED ORDER — ARFORMOTEROL TARTRATE 15 MCG/2ML IN NEBU
15.0000 ug | INHALATION_SOLUTION | Freq: Two times a day (BID) | RESPIRATORY_TRACT | Status: DC
  Administered 2023-11-25 – 2023-11-27 (×5): 15 ug via RESPIRATORY_TRACT
  Filled 2023-11-24 (×5): qty 2

## 2023-11-24 MED ORDER — BUDESONIDE 0.5 MG/2ML IN SUSP
0.5000 mg | Freq: Two times a day (BID) | RESPIRATORY_TRACT | Status: DC
  Administered 2023-11-24 – 2023-11-27 (×6): 0.5 mg via RESPIRATORY_TRACT
  Filled 2023-11-24 (×7): qty 2

## 2023-11-24 MED ORDER — REVEFENACIN 175 MCG/3ML IN SOLN
175.0000 ug | Freq: Every day | RESPIRATORY_TRACT | Status: DC
  Administered 2023-11-25 – 2023-11-27 (×3): 175 ug via RESPIRATORY_TRACT
  Filled 2023-11-24 (×3): qty 3

## 2023-11-24 MED ORDER — CLONAZEPAM 0.5 MG PO TABS
0.5000 mg | ORAL_TABLET | Freq: Two times a day (BID) | ORAL | Status: DC | PRN
  Administered 2023-11-26: 0.5 mg via ORAL
  Filled 2023-11-24: qty 1

## 2023-11-24 MED ORDER — BUSPIRONE HCL 5 MG PO TABS
30.0000 mg | ORAL_TABLET | Freq: Two times a day (BID) | ORAL | Status: DC
  Administered 2023-11-24 – 2023-11-27 (×7): 30 mg via ORAL
  Filled 2023-11-24 (×7): qty 6

## 2023-11-24 MED ORDER — DULOXETINE HCL 60 MG PO CPEP
60.0000 mg | ORAL_CAPSULE | Freq: Two times a day (BID) | ORAL | Status: DC
  Administered 2023-11-24 – 2023-11-27 (×7): 60 mg via ORAL
  Filled 2023-11-24 (×7): qty 1

## 2023-11-24 MED ORDER — UMECLIDINIUM BROMIDE 62.5 MCG/ACT IN AEPB
1.0000 | INHALATION_SPRAY | Freq: Every day | RESPIRATORY_TRACT | Status: DC
  Filled 2023-11-24: qty 7

## 2023-11-24 MED ORDER — METHYLPREDNISOLONE SODIUM SUCC 40 MG IJ SOLR
20.0000 mg | Freq: Every day | INTRAMUSCULAR | Status: DC
  Administered 2023-11-24 – 2023-11-27 (×4): 20 mg via INTRAVENOUS
  Filled 2023-11-24 (×4): qty 1

## 2023-11-24 NOTE — Consult Note (Addendum)
 Cardiology Consultation   Patient ID: ELIM HASBROOK MRN: [id number]; DOB: Feb 14, 1966  Admit date: 11/23/2023 Date of Consult: 11/25/2023  PCP:  System, Provider Not In   [facility] Providers Cardiologist:  Belva Boyden, MD        Patient Profile:   Erica [name] is a 58 y.o. 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 Butch Cashing.  History of Present Illness:   Erica [name] is a 58 year old female with the above history who is followed by Dr. Gollan and Shawnee Dellen, NP, in [facility]. She has a history of chronic diastolic CHF. She also has a history of intermittent chest pain in the past. Myoview  in 10/2017 was low risk with no evidence of ischemia. Echo in 08/2018 showed LVEF of 55-60% with mild LHV and grade 1 diastolic dysfunction, normal RV function, and no significant valvular disease. 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 She was initially treated with chemotherapy and radiation. She had good response to chemoradiation with significant reduction in lung mass.   She presented to the ED on 11/22/2023 for further evaluation of epigastric pain radiating to her back.  Abdominal/pelvic CTA showed cholelithiasis without evidence of acute cholecystitis.  LFTs and lipase were normal.  She was treated with a GI cocktail did further gallbladder imaging was offered but patient declined because she believed pain was coming from her gastric ulcers and needed to get home to her dog.  However she presented back to the ED the next day for recurrent pain and no relief with home Oxycodone .  At this visit, she reported several  episodes of nausea and vomiting as well.  Right upper quadrant ultrasound showed cholelithiasis with gallbladder wall thickening and pericholecystic fluid concerning for acute cholecystitis.  LFTs and lipase were again normal.  High-sensitivity troponin negative x 2.  She was admitted and General Surgery was consulted.  General Surgery recommended both Cardiology and Pulmonology evaluations for restratification prior to any potential surgery.  Pulmonology saw patient yesterday and determined she was high risk for complications such as pneumonia and prolonged ventilator dependence but not prohibitive risk.  They also recommended 5 days of IV steroids for COPD flareup.  Sinus evaluation, patient is resting comfortably no acute distress.  She states has been doing well from a cardiac standpoint lately.  She does have chronic dyspnea on exertion which has been a little worse lately but sounds like this is more due to COPD and CHF.  She is on 2L of O2 at night and intermittently during the day with exertion. She has been having a more productive cough lately.  His chronic stable orthopnea but no PND.  She states she had some lower extremity edema a couple weeks ago and took a couple extra doses of Lasix  which helped.  No edema on exam.  She describes rather atypical chest pain.  She states she will have a rare sharp pain in her chest that will last for few seconds to a couple minutes.  She states this is usually as if she has overexerted herself.  She states she has had this pain for years and states this is  the same pain she had back in 2019 when she had her last stress test.  The last episode of this she had was about a year ago when she was moving and doing a lot of lifting.  No recent chest pain.  Her activity is limited some by chronic pain issues but she is still able to do chores around her house including vacuuming and sweeping and denies any chest pain with these activities.  She is able to complete at least  4.73 METS of physical activity.  She has a long history of palpitations and describes some intermittent fluttering sensations but she states this occurs maybe once every other month and does not last long.  She occasionally has some intermittent lightheadedness/dizziness and does describe a syncopal episode 2 months ago.  She states this occurred when she was walking to get a drink out of her kitchen she denies any prodromal symptoms and states she just "went out."  This was an unwitnessed event so she does not know how long she was unconscious for.  She does describe a couple other episodes of syncope over the years (she estimates maybe 4 episodes over the last decade).   Past Medical History:  Diagnosis Date   (HFpEF) heart failure with preserved ejection fraction (HCC)    a. 05/2015 Echo: EF 60-65%, no rwma, PASP .   Acute pancreatitis 08/13/2018   Anxiety    Arthritis    Asthma    Bell's palsy    no deficit   Bronchitis    Cancer (HCC) 12/2022   rectal cancer   CHF (congestive heart failure) (HCC)    COPD (chronic obstructive pulmonary disease) (HCC)    Depression    Diabetes mellitus without complication (HCC)    type II 03/2017   Dyspnea    Fatty liver    per patient   Gall stones    per patient   Gastric ulcer    Hyperlipidemia    Hypertension    Lung nodule 12/2022   upper left   OSA (obstructive sleep apnea)    a. did not tolerate CPAP. On oxygen 2L via [place]   Pancreatitis    07/2018   Shoulder injury    6/19   Smoker     Past Surgical History:  Procedure Laterality Date   BRONCHIAL NEEDLE ASPIRATION BIOPSY  01/27/2023   Procedure: BRONCHIAL NEEDLE ASPIRATION BIOPSIES;  Surgeon: Vergia Glasgow, MD;  Location: [facility] ENDOSCOPY;  Service: Pulmonary;;   COLONOSCOPY WITH PROPOFOL  N/A 12/15/2022   Procedure: COLONOSCOPY WITH PROPOFOL ;  Surgeon: Luke Salaam, MD;  Location: [facility] ENDOSCOPY;  Service: Gastroenterology;  Laterality: N/A;   COLONOSCOPY WITH PROPOFOL  N/A  12/16/2022   Procedure: COLONOSCOPY WITH PROPOFOL ;  Surgeon: Luke Salaam, MD;  Location: [facility] ENDOSCOPY;  Service: Gastroenterology;  Laterality: N/A;   FLEXIBLE BRONCHOSCOPY N/A 06/20/2015   Procedure: FLEXIBLE BRONCHOSCOPY;  Surgeon: Enos Harts, MD;  Location: [facility] ORS;  Service: Pulmonary;  Laterality: N/A;   IR IMAGING GUIDED PORT INSERTION  02/25/2023   KNEE SURGERY Left    8 knee surgeries     Home Medications:  Prior to Admission medications   Medication Sig Start Date End Date Taking? Authorizing Provider  albuterol  (VENTOLIN  HFA) 108 (90 Base) MCG/ACT inhaler Inhale 2 puffs into the lungs every 6 (six) hours as needed for wheezing or shortness of breath.    Yes [provider]  busPIRone  (BUSPAR ) 30 MG tablet Take 30 mg by mouth 2 (two) times daily. PER PT  STATES HER DOSE WAS INCREASED 02/14/20  Yes [provider]  clonazePAM  (KLONOPIN ) 0.5 MG tablet Take 0.5 mg by mouth 2 (two) times daily. 03/12/20  Yes [provider]  cyclobenzaprine  (FLEXERIL ) 10 MG tablet Take 1 tablet (10 mg total) by mouth 3 (three) times daily as needed for muscle spasms. Patient taking differently: Take 10 mg by mouth 3 (three) times daily. 10/27/23  Yes Pickenpack-Cousar, Athena N, NP  diltiazem  (CARDIZEM  CD) 120 MG 24 hr capsule TAKE 1 CAPSULE BY MOUTH EVERY DAY 04/22/23  Yes Gollan, Timothy J, MD  diltiazem  (CARDIZEM ) 30 MG tablet TAKE 1 TABLET(30 MG) BY MOUTH THREE TIMES DAILY AS NEEDED Patient taking differently: Take 30 mg by mouth 2 (two) times daily. 06/20/23  Yes Gollan, Timothy J, MD  DULoxetine  (CYMBALTA ) 60 MG capsule Take 60 mg by mouth 2 (two) times daily.    Yes [provider]  Fluticasone -Salmeterol (ADVAIR) 250-50 MCG/DOSE AEPB Inhale 1 puff into the lungs 2 (two) times daily.   Yes [provider]  furosemide  (LASIX ) 40 MG tablet TAKE 1 TABLET BY MOUTH EVERY DAY. MAY TAKE ADDITIONAL TABLET IN THE AM FOR WEIGHT GAIN AS NEEDED Patient taking  differently: Take 40 mg by mouth daily. 03/10/23  Yes Gollan, Timothy J, MD  JARDIANCE 25 MG TABS tablet Take 25 mg by mouth daily. 08/18/21  Yes [provider]  magic mouthwash (nystatin , lidocaine , diphenhydrAMINE, alum & mag hydroxide) suspension Take 5 mLs by mouth 4 (four) times daily as needed for mouth pain. 04/14/23  Yes Sonja [place], MD  metFORMIN  (GLUCOPHAGE -XR) 500 MG 24 hr tablet Take 1,000 mg by mouth 2 (two) times daily. 10/21/22  Yes [provider]  ondansetron  (ZOFRAN ) 8 MG tablet Take 1 tablet (8 mg total) by mouth every 8 (eight) hours as needed for nausea, vomiting or refractory nausea / vomiting. Start on the third day after chemotherapy. Patient taking differently: Take 8 mg by mouth daily as needed for nausea, vomiting or refractory nausea / vomiting. 02/17/23  Yes Sonja [place], MD  Oxycodone  HCl 10 MG TABS Take 1 tablet (10 mg total) by mouth 3 (three) times daily as needed. Patient taking differently: Take 10 mg by mouth in the morning, at noon, and at bedtime. 10/27/23  Yes Pickenpack-Cousar, Giles Labrum, NP  OXYGEN Inhale 2 L into the lungs at bedtime. Or as needed throughout the day if short of breath   Yes [provider]  pantoprazole  (PROTONIX ) 40 MG tablet Take 1 tablet (40 mg total) by mouth daily. 05/04/18  Yes Dannial Duty, MD  potassium chloride  SA (KLOR-CON ) 20 MEQ tablet Take 20 mEq by mouth 2 (two) times daily. 02/15/20  Yes [provider]  pregabalin  (LYRICA ) 150 MG capsule Take 1 capsule (150 mg total) by mouth 3 (three) times daily. 09/21/23  Yes Pickenpack-Cousar, Giles Labrum, NP  prochlorperazine  (COMPAZINE ) 10 MG tablet Take 1 tablet (10 mg total) by mouth every 6 (six) hours as needed for nausea or vomiting. Patient taking differently: Take 10 mg by mouth daily as needed for nausea or vomiting. 02/17/23  Yes Sonja [place], MD  spironolactone  (ALDACTONE ) 25 MG tablet TAKE 1 TABLET(25 MG) BY MOUTH EVERY MORNING 05/23/23  Yes Gollan, Timothy J, MD   zolpidem (AMBIEN) 10 MG tablet Take 5-10 mg by mouth at bedtime. 12/01/22  Yes [provider]  amoxicillin -clavulanate (AUGMENTIN ) 875-125 MG tablet Take 1 tablet by mouth 2 (two) times daily. Patient not taking: Reported on 11/24/2023 09/08/23  Sonja Firth, MD  capecitabine  (XELODA ) 500 MG tablet Take 3 tablets (1,500 mg total) by mouth 2 (two) times daily after a meal. Take on days of radiation only, Monday through Friday Patient not taking: Reported on 11/24/2023 07/04/23   Sonja [place], MD  fluconazole  (DIFLUCAN ) 100 MG tablet Take 1 tablet (100 mg total) by mouth daily. For antibiotics induced vaginitis Patient not taking: Reported on 11/24/2023 09/08/23   Sonja [place], MD  lidocaine -prilocaine  (EMLA ) cream Apply to affected area once Patient not taking: Reported on 11/24/2023 02/17/23   Sonja [place], MD  sucralfate  (CARAFATE ) 1 G tablet Take 1 tablet (1 g total) by mouth 4 (four) times daily -  with meals and at bedtime. Patient not taking: Reported on 11/24/2023 06/23/15   Renold Cashing, MD    Inpatient Medications: Scheduled Meds:  arformoterol   15 mcg Nebulization BID   budesonide  (PULMICORT ) nebulizer solution  0.5 mg Nebulization BID   busPIRone   30 mg Oral BID   Chlorhexidine  Gluconate Cloth  6 each Topical Daily   diltiazem   120 mg Oral Daily   DULoxetine   60 mg Oral BID   insulin  aspart  0-5 Units Subcutaneous QHS   insulin  aspart  0-9 Units Subcutaneous TID WC   methylPREDNISolone  (SOLU-MEDROL ) injection  20 mg Intravenous Daily   pantoprazole   40 mg Oral Daily   pregabalin   150 mg Oral TID   revefenacin   175 mcg Nebulization Daily   Continuous Infusions:  ceFEPime  (MAXIPIME ) IV 2 g (11/25/23 0539)   metronidazole  500 mg (11/25/23 0822)   PRN Meds: acetaminophen  **OR** acetaminophen , albuterol , bisacodyl , clonazePAM , hydrALAZINE , HYDROmorphone  (DILAUDID ) injection, labetalol , oxyCODONE , polyethylene glycol, sodium chloride  flush  Allergies:    Allergies  Allergen Reactions    Tramadol Palpitations and Other (See Comments)    Heart Skipping Beats, Heart palpatations   Penicillins Rash    Social History:   Social History   Socioeconomic History   Marital status: Widowed    Spouse name: Not on file   Number of children: Not on file   Years of education: Not on file   Highest education level: Not on file  Occupational History   Occupation: unemployed  Tobacco Use   Smoking status: Every Day    Current packs/day: 3.00    Average packs/day: 3.0 packs/day for 46.0 years (138.0 ttl pk-yrs)    Types: Cigarettes   Smokeless tobacco: Never   Tobacco comments:    0.5PPD 01/13/2023    Started Chantix  last week  Vaping Use   Vaping status: Never Used  Substance and Sexual Activity   Alcohol use: Yes    Comment: 6 liquor drinks/day   Drug use: No   Sexual activity: Yes    Birth control/protection: Post-menopausal  Other Topics Concern   Not on file  Social History Narrative   Not on file   Social Drivers of Health   Financial Resource Strain: High Risk (07/18/2017)   Overall Financial Resource Strain (CARDIA)    Difficulty of Paying Living Expenses: Very hard  Food Insecurity: No Food Insecurity (11/23/2023)   Hunger Vital Sign    Worried About Running Out of Food in the Last Year: Never true    Ran Out of Food in the Last Year: Never true  Transportation Needs: No Transportation Needs (11/23/2023)   PRAPARE - Administrator, Civil Service (Medical): No    Lack of Transportation (Non-Medical): No  Physical Activity: Insufficiently Active (07/18/2017)   Exercise Vital Sign  Days of Exercise per Week: 5 days    Minutes of Exercise per Session: 20 min  Stress: Stress Concern Present (07/18/2017)   [employer] of Occupational Health - Occupational Stress Questionnaire    Feeling of Stress : Very much  Social Connections: Socially Isolated (07/18/2017)   Social Connection and Isolation Panel [NHANES]    Frequency of Communication  with Friends and Family: Twice a week    Frequency of Social Gatherings with Friends and Family: Never    Attends Religious Services: Never    Database administrator or Organizations: No    Attends Banker Meetings: Never    Marital Status: Divorced  Catering manager Violence: Not At Risk (11/23/2023)   Humiliation, Afraid, Rape, and Kick questionnaire    Fear of Current or Ex-Partner: No    Emotionally Abused: No    Physically Abused: No    Sexually Abused: No    Family History:   Family History  Problem Relation Age of Onset   Breast cancer Mother        early 1's   Lung cancer Father    Brain cancer Father    Bone cancer Father    Colon cancer Maternal Aunt    Cancer Maternal Uncle        liver?   Diabetes Maternal Grandmother    Asthma Other      ROS:  Please see the history of present illness.   Physical Exam/Data:   Vitals:   11/24/23 0352 11/24/23 0754 11/24/23 1149 11/24/23 1620  BP: 126/70 128/69 105/63 115/73  Pulse: (!) 113 (!) 120 (!) 114 (!) 110  Resp: 20 19 18 18   Temp: 99.4 F (37.4 C) 99.5 F (37.5 C) 99.9 F (37.7 C) 99.6 F (37.6 C)  TempSrc: Oral Oral  Oral  SpO2: 95% 93% 93% 93%  Weight:      Height:        Intake/Output Summary (Last 24 hours) at 11/24/2023 2027 Last data filed at 11/24/2023 1652 Gross per 24 hour  Intake 1341.07 ml  Output 2400 ml  Net -1058.93 ml      11/23/2023    9:55 PM 11/17/2023   10:35 AM 10/27/2023    1:26 PM  Last 3 Weights  Weight (lbs) 169 lb 15.6 oz 170 lb 12.8 oz 157 lb 11.2 oz  Weight (kg) 77.1 kg 77.474 kg 71.532 kg     Body mass index is 25.84 kg/m.  General: 58 y.o. Caucasian female resting comfortably in no acute distress. HEENT: Normocephalic and atraumatic. Sclera clear.  Neck: Supple. No carotid bruits. No JVD. Heart: RRR. Distinct S1 and S2. No murmurs, gallops, or rubs.  Lungs: No increased work of breathing. Clear to ausculation bilaterally. No wheezes, rhonchi, or rales.   Extremities: No lower extremity edema.    Skin: Warm and dry. Neuro: Alert and oriented x3. No focal deficits. Psych: Normal affect. Responds appropriately.   EKG:  The EKG was personally reviewed and demonstrates: Sinus tachycardia, rate 107 beats minute, with no acute ST/T wave changes. Telemetry: Not on telemetry.  Relevant CV Studies:  Myoview  10/21/2017: There was no ST segment deviation noted during stress. The study is normal. This is a low risk study. The left ventricular ejection fraction is normal (55-65%). _______________  Echocardiogram 09/11/2018: Impressions: 1. The left ventricle has normal systolic function, with an ejection  fraction of 55-60%. The cavity size was normal. There is mild concentric  left ventricular hypertrophy. Left ventricular diastolic  Doppler  parameters are consistent with impaired  relaxation.   2. The right ventricle has normal systolic function. The cavity was  normal. There is no increase in right ventricular wall thickness.   3. The mitral valve is normal in structure.   4. The tricuspid valve is normal in structure.   5. The aortic valve is tricuspid.   6. The pulmonic valve was normal in structure.   Laboratory Data:  High Sensitivity Troponin:   Recent Labs  Lab 11/23/23 1609 11/23/23 2124  TROPONINIHS 6 5     Chemistry Recent Labs  Lab 11/22/23 2005 11/23/23 1609 11/24/23 0258  NA 136 126* 132*  K 3.8 4.0 3.4*  CL 99 90* 97*  CO2 21* 22 24  GLUCOSE 158* 195* 126*  BUN 9 11 11   CREATININE 0.65 0.78 0.74  CALCIUM  9.4 9.5 8.7*  GFRNONAA >60 >60 >60  ANIONGAP 16* 14 11    Recent Labs  Lab 11/22/23 2005 11/23/23 1609  PROT 7.7 8.4*  ALBUMIN 3.6 4.0  AST 27 25  ALT 19 20  ALKPHOS 95 111  BILITOT 0.4 1.2   Lipids No results for input(s): "CHOL", "TRIG", "HDL", "LABVLDL", "LDLCALC", "CHOLHDL" in the last 168 hours.  Hematology Recent Labs  Lab 11/22/23 2005 11/23/23 1609 11/24/23 0258  WBC 11.4* 17.2*  17.8*  RBC 4.22 4.74 4.16  HGB 13.5 15.1* 13.1  HCT 43.1 45.6 39.8  MCV 102.1* 96.2 95.7  MCH 32.0 31.9 31.5  MCHC 31.3 33.1 32.9  RDW 17.0* 16.8* 17.2*  PLT 231 270 200   Thyroid No results for input(s): "TSH", "FREET4" in the last 168 hours.  BNPNo results for input(s): "BNP", "PROBNP" in the last 168 hours.  DDimer No results for input(s): "DDIMER" in the last 168 hours.   Assessment and Plan:   Pre-Op Evaluation Patient was admitted with acute cholecystitis.  She has chronic dyspnea on exertion primarily due to COPD. Pulmonology saw patient yesterday for this and recommended 5 days of IV steroids for COPD flare. She describes some rare atypical chest pain that she has had for years. This sounds stable. Last episode of this was 1 year ago. She also reports a syncopal episode 2 months ago without any prodrome. She is able to complete >4.73 METS of physical activity. Revised Cardiac Risk Score = 2 suggesting a 10.1% risk of major cardiac event perioperatively; however, this does not take into account her other comorbidities such as COPD, stage IV rectal cancer, and deconditioning She has not had an Echo since 2020. Although dyspnea sounds more due to COPD, will update an Echo to reassess LV function. Will discuss with MD about whether Lexiscan  Myoview  is needed prior to surgery. Will place patient on telemetry given history of palpitations and syncope.    Chronic HFpEF Last Echo in 2020 showed LVEF of 55-60% with mild LHV and grade 1 diastolic dysfunction, normal RV function, and no significant valvular disease.  - Euvolemic on exam.  - On Lasix  40mg  daily, Spironolactone  25mg  daily, and  Jardiance 25mg  daily at home but these is currently on hold in setting of acute cholecystitis and anticipation of surgery. - Continue to monitor volume status closely.   Atypical Chest Pain Coronary Artery Calcifications Noted on chest CT in 10/2023. Prior Myoview  in 2019 was low risk with no evidence of  ischemia. High-sensitivity troponin negative x2 this admission.  - She describes rare atypical chest pain that she has had for years and is stable.  -  Symptoms don't sound like angina but will discuss whether any ischemic evaluation is necessary prior to surgery with MD as above.   Palpitations  Patient has a long history of palpitations.  - Overall stable. Occur rarely.  - Continue Cardizem  CD 120mg  daily.  - Will place on telemetry.  Syncope Patient describes 4 episodes of syncope over the last decade. The most recent one occurred 2 months ago when she was walking to get a drink out of the kitchen. She denies any prodromal symptoms prior to this episode. - Will update Echo.  - Will place on telemetry.  - Consider outpatient monitor at discharge.  Hypertension BP soft at times but stable.  - Continue Cardizem  CD 120mg  daily.  Otherwise, per primary team: - Acute cholecystitis  - COPD with chronic hypoxic respiratory failure on 2L of O2 at night - Hyperlipidemia - Chronic pain syndrome - Stage IV rectal cancer - Anxiety/ depression - History of gastric ulcers    Risk Assessment/Risk Scores:    [place]  Heart Association (NYHA) Functional Class NYHA Class III. Suspect functional status is more limited by severe COPD than CHF.   For questions or updates, please contact [facility] Please consult [email or web address] for contact info under    Signed, Callie E Goodrich, PA-C  11/25/2023 9:32 AM  PT seen and examined   I have reviewed note by Madolyn Schlatter above    Agree  Pt is a 58 yo with Hx of HFpEF, severe COPD, Stage IV rectal CA   CT doe show that she has coronary calcifications   She is able to do 4 METS activity without problem  One episode of syncope, on talking to her sounds orthostatic    Admitted for epigastric pain  CTA shows cholelithiasis   Plan for choley      Pt denies CP   She says she can sweep and vacuum at house  OK   No change over past few months      ON exam, Pt laying comfortably flat Neck:  JVP is normal Lungs are CTA    Cardiac Exam  RRR  N o S3  No murmurs Abd  Sl distended   Deferred further exam Ext  Feet warm  no edema  2+ pulses   EKG on 11/23/23:  Sinus tach 107  REcomm:      From a cardiac standpoint I think pt is at a at some increased risk but relatively  low (6%) for major cardiac event with surgery or in periop period  I think she is OK to proceed with surgery without further testing      I would recomm an echo to redefine LVEF / RVEF, more for help in the periperative period should volume be a problem    Echo does not have to be done prior     Would keep on telemetry     Will continue to follow post op   Ola Berger MD

## 2023-11-24 NOTE — Plan of Care (Signed)
  Problem: Metabolic: Goal: Ability to maintain appropriate glucose levels will improve Outcome: Progressing   Problem: Clinical Measurements: Goal: Diagnostic test results will improve Outcome: Progressing

## 2023-11-24 NOTE — Consult Note (Signed)
 NAME:  Erica [name], MRN:  [id number], DOB:  1965-11-02, LOS: 0 ADMISSION DATE:  11/23/2023, CONSULTATION DATE:  5/8 REFERRING MD: Dr Butch Cashing, CHIEF COMPLAINT: Cholecystitis  History of Present Illness:  58 year old female with past medical history as below, which is significant for rectal cancer with oligometastatic disease, diabetes, gastric ulcers, OSA, CHF, and COPD.  She is a current 1 pack/day smoker and has been for 45 years.  Her COPD is managed by the [facility] in [place] and she is prescribed Advair and Spiriva .  She has not taken Spiriva  for approximately 1 year she has been unable to get the prescription refilled.  She reports that at baseline she is able to do most activities of daily living without becoming short of breath.  She is able to walk the grocery store without difficulty.  There are some times when she is not feeling well that she needs to take her portable oxygen with her, however, this is pretty infrequent.  She does wear oxygen 2 L most nights.  She also reports about 2 to 3 weeks of cough productive for green sputum.  She has not sought medical attention and attributed this to bronchitis.  She presented to [facility] emergency department 5/6 with complaints of epigastric pain radiating to her back for 24 hours.  CT showed cholelithiasis without cholecystitis and she was discharged home.  She presented again 5/7.  She had a ultrasound of the right upper quadrant which was more concerning for acute cholecystitis.  She was admitted to the hospitalist service and surgery was consulted.  Empiric antibiotics were started. There are plans for cholecystectomy and PCCM has been asked to provide pre-op pulmonary evaluation.   Pertinent  Medical History   has a past medical history of (HFpEF) heart failure with preserved ejection fraction (HCC), Acute pancreatitis (08/13/2018), Anxiety, Arthritis, Asthma, Bell's palsy, Bronchitis, Cancer (HCC) (12/2022), CHF (congestive heart  failure) (HCC), COPD (chronic obstructive pulmonary disease) (HCC), Depression, Diabetes mellitus without complication (HCC), Dyspnea, Fatty liver, Gall stones, Gastric ulcer, Hyperlipidemia, Hypertension, Lung nodule (12/2022), OSA (obstructive sleep apnea), Pancreatitis, Shoulder injury, and Smoker.   Significant Hospital Events: Including procedures, antibiotic start and stop dates in addition to other pertinent events     Interim History / Subjective:    Objective    Blood pressure 105/63, pulse (!) 114, temperature 99.9 F (37.7 C), resp. rate 18, height 5\' 8"  (1.727 m), weight 77.1 kg, last menstrual period 03/13/2015, SpO2 93%.        Intake/Output Summary (Last 24 hours) at 11/24/2023 1450 Last data filed at 11/24/2023 1151 Gross per 24 hour  Intake 937.36 ml  Output 1800 ml  Net -862.64 ml   Filed Weights   11/23/23 2155  Weight: 77.1 kg    Examination: General: adult female appears older than stated age.  HENT: [place]/AT, PERRL, no JVD Lungs: coarse crackles in bilateral bases. Otherwise clear, no wheeze.  Cardiovascular: RRR, no MRG Abdomen: Soft, tender RUQ Extremities: No acute deformity or ROM limitation Neuro: Alert, oriented, non-focal  Resolved Hospital Problem list     Assessment & Plan:   COPD without acute exacerbation - will transition her to nebulized Brovana , budesonide , and Yupelri in the perioperative period to optimize pulmonary status - Smoking cessation counseling provided.   Acute bronchitis: she describes newly productive cough and progressive dyspnea.  - Current antibiotics will cover.  - Send RVP, sputum culture  Cholecystitis: - Management per surgery - Empiric antibiotics  Per-operative pulmonary assessment:  -  Moderate perioperative pulmonary risk due to COPD and overall functional/nutritional status. See Arozullah and ARISCAT scores below. Risk has been communicated to Erica [name]. Will plan to optimize pulmonary status with  nebulized bronchodilators, incentive spirometry, and flutter valve in preparation of surgery and will be available in the ICU postop should she require a prolonged vent wean.   1) RISK FOR PROLONGED MECHANICAL VENTILAION - > 48h  1A) Arozullah - Prolonged mech ventilation risk Arozullah Postperative Pulmonary Risk Score - for mech ventilation dependence >48h USAA, Ann Surg 2000, major non-cardiac surgery) Comment Score  Type of surgery - abd ao aneurysm (27), thoracic (21), neurosurgery / upper abdominal / vascular (21), neck (11) Chole 21  Emergency Surgery - (11)    Albumin < 3 or poor nutritional state - (9)  9  BUN > 30 -  (8)    Partial or completely dependent functional status - (7)    COPD -  (6)  6  Age - 60 to 29 (4), > 70  (6)    TOTAL    Risk Stratifcation scores  - < 10 (0.5%), 11-19 (1.8%), 20-27 (4.2%), 28-40 (10.1%), >40 (26.6%) 10% risk of >48 hr vent requirement 36     RISK FOR ANY POST-OP PULMONARY COMPLICATION Score source Risk  CANET/ARISCAT Score - risk for ANY/ALl pulmonary complications - > risk of in-hospital post-op pulmonary complications (composite including respiratory failure, respiratory infection, pleural effusion, atelectasis, pneumothorax, bronchospasm, aspiration pneumonitis) ModelSolar.es - based on age, anemia, pulse ox, resp infection prior 30d, incision site, duration of surgery, and emergency v elective surgery 13.3 %      Best Practice (right click and "Reselect all SmartList Selections" daily)   Per primary  Labs   CBC: Recent Labs  Lab 11/22/23 2005 11/23/23 1609 11/24/23 0258  WBC 11.4* 17.2* 17.8*  NEUTROABS  --  15.5*  --   HGB 13.5 15.1* 13.1  HCT 43.1 45.6 39.8  MCV 102.1* 96.2 95.7  PLT 231 270 200    Basic Metabolic Panel: Recent Labs  Lab 11/22/23 2005 11/23/23 1609 11/24/23 0258  NA 136 126* 132*  K 3.8 4.0 3.4*  CL 99 90* 97*  CO2 21* 22 24   GLUCOSE 158* 195* 126*  BUN 9 11 11   CREATININE 0.65 0.78 0.74  CALCIUM  9.4 9.5 8.7*   GFR: Estimated Creatinine Clearance: 82.7 mL/min (by C-G formula based on SCr of 0.74 mg/dL). Recent Labs  Lab 11/22/23 2005 11/23/23 1609 11/24/23 0258  WBC 11.4* 17.2* 17.8*    Liver Function Tests: Recent Labs  Lab 11/22/23 2005 11/23/23 1609  AST 27 25  ALT 19 20  ALKPHOS 95 111  BILITOT 0.4 1.2  PROT 7.7 8.4*  ALBUMIN 3.6 4.0   Recent Labs  Lab 11/22/23 2005 11/23/23 1609  LIPASE 23 25   No results for input(s): "AMMONIA" in the last 168 hours.  ABG    Component Value Date/Time   PHART 7.49 (H) 06/20/2015 0500   PCO2ART 25 (L) 06/20/2015 0500   PO2ART 72 (L) 06/20/2015 0500   HCO3 19.1 (L) 06/20/2015 0500   ACIDBASEDEF 2.6 (H) 06/20/2015 0500   O2SAT 95.5 06/20/2015 0500     Coagulation Profile: No results for input(s): "INR", "PROTIME" in the last 168 hours.  Cardiac Enzymes: No results for input(s): "CKTOTAL", "CKMB", "CKMBINDEX", "TROPONINI" in the last 168 hours.  HbA1C: Hgb A1c MFr Bld  Date/Time Value Ref Range Status  11/23/2023 04:09 PM 5.6 4.8 - 5.6 % Final  Comment:    (NOTE) Pre diabetes:          5.7%-6.4%  Diabetes:              >6.4%  Glycemic control for   <7.0% adults with diabetes   09/11/2018 05:27 AM 6.0 (H) 4.8 - 5.6 % Final    Comment:    (NOTE) Pre diabetes:          5.7%-6.4% Diabetes:              >6.4% Glycemic control for   <7.0% adults with diabetes     CBG: Recent Labs  Lab 11/23/23 2145 11/24/23 0752 11/24/23 1147  GLUCAP 138* 132* 118*    Review of Systems:   Bolds are positive  Constitutional: weight loss, gain, night sweats, Fevers, chills, fatigue .  HEENT: headaches, Sore throat, sneezing, nasal congestion, post nasal drip, Difficulty swallowing, Tooth/dental problems, visual complaints visual changes, ear ache CV:  chest pain, radiates:,Orthopnea, PND, swelling in lower extremities, dizziness,  palpitations, syncope.  GI  heartburn, indigestion, abdominal pain, nausea, vomiting, diarrhea, change in bowel habits, loss of appetite, bloody stools.  Resp: cough, productive: , hemoptysis, dyspnea, chest pain, pleuritic.  Skin: rash or itching or icterus GU: dysuria, change in color of urine, urgency or frequency. flank pain, hematuria  MS: joint pain or swelling. decreased range of motion  Psych: change in mood or affect. depression or anxiety.  Neuro: difficulty with speech, weakness, numbness, ataxia    Past Medical History:  She,  has a past medical history of (HFpEF) heart failure with preserved ejection fraction (HCC), Acute pancreatitis (08/13/2018), Anxiety, Arthritis, Asthma, Bell's palsy, Bronchitis, Cancer (HCC) (12/2022), CHF (congestive heart failure) (HCC), COPD (chronic obstructive pulmonary disease) (HCC), Depression, Diabetes mellitus without complication (HCC), Dyspnea, Fatty liver, Gall stones, Gastric ulcer, Hyperlipidemia, Hypertension, Lung nodule (12/2022), OSA (obstructive sleep apnea), Pancreatitis, Shoulder injury, and Smoker.   Surgical History:   Past Surgical History:  Procedure Laterality Date   BRONCHIAL NEEDLE ASPIRATION BIOPSY  01/27/2023   Procedure: BRONCHIAL NEEDLE ASPIRATION BIOPSIES;  Surgeon: Vergia Glasgow, MD;  Location: [facility] ENDOSCOPY;  Service: Pulmonary;;   COLONOSCOPY WITH PROPOFOL  N/A 12/15/2022   Procedure: COLONOSCOPY WITH PROPOFOL ;  Surgeon: Luke Salaam, MD;  Location[facility] ENDOSCOPY;  Service: Gastroenterology;  Laterality: N/A;   COLONOSCOPY WITH PROPOFOL  N/A 12/16/2022   Procedure: COLONOSCOPY WITH PROPOFOL ;  Surgeon: Luke Salaam, MD;  Location: [facility] ENDOSCOPY;  Service: Gastroenterology;  Laterality: N/A;   FLEXIBLE BRONCHOSCOPY N/A 06/20/2015   Procedure: FLEXIBLE BRONCHOSCOPY;  Surgeon: Enos Harts, MD;  Location: [facility] ORS;  Service: Pulmonary;  Laterality: N/A;   IR IMAGING GUIDED PORT INSERTION  02/25/2023   KNEE SURGERY Left     8 knee surgeries     Social History:   reports that she has been smoking cigarettes. She has a 138 pack-year smoking history. She has never used smokeless tobacco. She reports current alcohol use. She reports that she does not use drugs.   Family History:  Her family history includes Asthma in an other family member; Bone cancer in her father; Brain cancer in her father; Breast cancer in her mother; Cancer in her maternal uncle; Colon cancer in her maternal aunt; Diabetes in her maternal grandmother; Lung cancer in her father.   Allergies Allergies  Allergen Reactions   Tramadol Palpitations and Other (See Comments)    Heart Skipping Beats, Heart palpatations   Penicillins Rash     Home Medications  Prior to Admission medications  Medication Sig Start Date End Date Taking? Authorizing Provider  albuterol  (VENTOLIN  HFA) 108 (90 Base) MCG/ACT inhaler Inhale 2 puffs into the lungs every 6 (six) hours as needed for wheezing or shortness of breath.    Yes [provider]  busPIRone (BUSPAR) 30 MG tablet Take 30 mg by mouth 2 (two) times daily. PER PT STATES HER DOSE WAS INCREASED 02/14/20  Yes [provider]  clonazePAM (KLONOPIN) 0.5 MG tablet Take 0.5 mg by mouth 2 (two) times daily. 03/12/20  Yes [provider]  cyclobenzaprine  (FLEXERIL ) 10 MG tablet Take 1 tablet (10 mg total) by mouth 3 (three) times daily as needed for muscle spasms. Patient taking differently: Take 10 mg by mouth 3 (three) times daily. 10/27/23  Yes Pickenpack-Cousar, Athena N, NP  diltiazem  (CARDIZEM  CD) 120 MG 24 hr capsule TAKE 1 CAPSULE BY MOUTH EVERY DAY 04/22/23  Yes Gollan, Timothy J, MD  diltiazem  (CARDIZEM ) 30 MG tablet TAKE 1 TABLET(30 MG) BY MOUTH THREE TIMES DAILY AS NEEDED Patient taking differently: Take 30 mg by mouth 2 (two) times daily. 06/20/23  Yes Gollan, Timothy J, MD  DULoxetine  (CYMBALTA ) 60 MG capsule Take 60 mg by mouth 2 (two) times daily.    Yes [provider]  Fluticasone-Salmeterol (ADVAIR) 250-50 MCG/DOSE AEPB Inhale 1 puff into the lungs 2 (two) times daily.   Yes [provider]  furosemide  (LASIX ) 40 MG tablet TAKE 1 TABLET BY MOUTH EVERY DAY. MAY TAKE ADDITIONAL TABLET IN THE AM FOR WEIGHT GAIN AS NEEDED Patient taking differently: Take 40 mg by mouth daily. 03/10/23  Yes Gollan, Timothy J, MD  JARDIANCE 25 MG TABS tablet Take 25 mg by mouth daily. 08/18/21  Yes [provider]  magic mouthwash (nystatin , lidocaine , diphenhydrAMINE, alum & mag hydroxide) suspension Take 5 mLs by mouth 4 (four) times daily as needed for mouth pain. 04/14/23  Yes Sonja Macdona, MD  metFORMIN  (GLUCOPHAGE -XR) 500 MG 24 hr tablet Take 1,000 mg by mouth 2 (two) times daily. 10/21/22  Yes [provider]  ondansetron  (ZOFRAN ) 8 MG tablet Take 1 tablet (8 mg total) by mouth every 8 (eight) hours as needed for nausea, vomiting or refractory nausea / vomiting. Start on the third day after chemotherapy. Patient taking differently: Take 8 mg by mouth daily as needed for nausea, vomiting or refractory nausea / vomiting. 02/17/23  Yes Sonja [place], MD  Oxycodone  HCl 10 MG TABS Take 1 tablet (10 mg total) by mouth 3 (three) times daily as needed. Patient taking differently: Take 10 mg by mouth in the morning, at noon, and at bedtime. 10/27/23  Yes Pickenpack-Cousar, Giles Labrum, NP  OXYGEN Inhale 2 L into the lungs at bedtime. Or as needed throughout the day if short of breath   Yes [provider]  pantoprazole  (PROTONIX ) 40 MG tablet Take 1 tablet (40 mg total) by mouth daily. 05/04/18  Yes Dannial Duty, MD  potassium chloride  SA (KLOR-CON ) 20 MEQ tablet Take 20 mEq by mouth 2 (two) times daily. 02/15/20  Yes [provider]  pregabalin  (LYRICA ) 150 MG capsule Take 1 capsule (150 mg total) by mouth 3 (three) times daily. 09/21/23  Yes Pickenpack-Cousar, Giles Labrum, NP  prochlorperazine  (COMPAZINE ) 10 MG tablet Take 1 tablet (10 mg total) by mouth  every 6 (six) hours as needed for nausea or vomiting. Patient taking differently: Take 10 mg by mouth daily as needed for nausea or vomiting. 02/17/23  Yes Sonja [place], MD  spironolactone  (ALDACTONE ) 25  MG tablet TAKE 1 TABLET(25 MG) BY MOUTH EVERY MORNING 05/23/23  Yes Gollan, Timothy J, MD  zolpidem (AMBIEN) 10 MG tablet Take 5-10 mg by mouth at bedtime. 12/01/22  Yes [provider]  amoxicillin -clavulanate (AUGMENTIN ) 875-125 MG tablet Take 1 tablet by mouth 2 (two) times daily. Patient not taking: Reported on 11/24/2023 09/08/23   Sonja [place], MD  capecitabine  (XELODA ) 500 MG tablet Take 3 tablets (1,500 mg total) by mouth 2 (two) times daily after a meal. Take on days of radiation only, Monday through Friday Patient not taking: Reported on 11/24/2023 07/04/23   Sonja [place], MD  fluconazole  (DIFLUCAN ) 100 MG tablet Take 1 tablet (100 mg total) by mouth daily. For antibiotics induced vaginitis Patient not taking: Reported on 11/24/2023 09/08/23   Sonja [place], MD  lidocaine -prilocaine  (EMLA ) cream Apply to affected area once Patient not taking: Reported on 11/24/2023 02/17/23   Sonja , MD  sucralfate  (CARAFATE ) 1 G tablet Take 1 tablet (1 g total) by mouth 4 (four) times daily -  with meals and at bedtime. Patient not taking: Reported on 11/24/2023 06/23/15   Renold Cashing, MD     Critical care time:      Roz Cornelia, AGACNP-BC [facility]  See Amion for personal pager PCCM on call pager [phone] until 7pm. Please call Elink 7p-7a. [phone]  11/24/2023 4:20 PM

## 2023-11-24 NOTE — TOC Initial Note (Signed)
 Transition of Care [facility]) - Initial/Assessment Note    Patient Details  Name: Erica [name] MRN: [id number] Date of Birth: 06-03-1966  Transition of [facility]) CM/SW Contact:    Bari Leys, RN Phone Number: 11/24/2023, 11:24 AM  Clinical Narrative:  Met with patient at bedside to introduce role of TOC/NCM and review for dc planning, pt reports she has an established PCP and pharmacy, no current home care services or home DME, patient reports she resides alone bu her daughter lives next door, reports family will provide transportation as discharge. TOC will continue to follow.                  Expected Discharge Plan: Home/Self Care     Patient Goals and CMS Choice Patient states their goals for this hospitalization and ongoing recovery are:: return home          Expected Discharge Plan and Services       Living arrangements for the past 2 months: Single Family Home                                      Prior Living Arrangements/Services Living arrangements for the past 2 months: Single Family Home Lives with:: Self Patient language and need for interpreter reviewed:: Yes Do you feel safe going back to the place where you live?: Yes      Need for Family Participation in Patient Care: Yes (Comment) Care giver support system in place?: Yes (comment)   Criminal Activity/Legal Involvement Pertinent to Current Situation/Hospitalization: No - Comment as needed  Activities of Daily Living   ADL Screening (condition at time of admission) Independently performs ADLs?: Yes (appropriate for developmental age) Is the patient deaf or have difficulty hearing?: No Does the patient have difficulty seeing, even when wearing glasses/contacts?: No Does the patient have difficulty concentrating, remembering, or making decisions?: No  Permission Sought/Granted                  Emotional Assessment Appearance:: Appears stated age Attitude/Demeanor/Rapport:  Engaged Affect (typically observed): Accepting Orientation: : Oriented to Self, Oriented to Place, Oriented to  Time, Oriented to Situation Alcohol / Substance Use: Not Applicable Psych Involvement: No (comment)  Admission diagnosis:  Acute cholecystitis [K81.0] Hyponatremia [E87.1] Patient Active Problem List   Diagnosis Date Noted   Acute cholecystitis 11/23/2023   Malignant neoplasm of overlapping sites of left lung (HCC) 07/06/2023   Port-A-Cath in place 03/03/2023   Goals of care, counseling/discussion 02/04/2023   Lung nodule 01/13/2023   Rectal cancer (HCC) 12/23/2022   Encounter for screening involving social determinants of health (SDoH) 12/23/2022   COPD exacerbation (HCC) 12/23/2022   Noncompliance with medication monitoring regimen 08/11/2022   Failure to attend appointment 08/09/2022   Cellulitis and abscess of leg (Right) 04/22/2022   Elbow effusion (Right) 02/01/2022   Fall (01/17/2022) 01/18/2022   Acute pain of right knee 01/18/2022   Acute right elbow pain 01/18/2022   Acute pain of right shoulder 01/18/2022   Chronic hand pain (Bilateral) 06/15/2021   Primary osteoarthritis of left knee 02/15/2021   Chronic use of opiate for therapeutic purpose 10/26/2020   Anterolisthesis of cervical spine (C4/C5) (3 mm) 10/02/2020   Retrolisthesis of cervical spine (C5/C6) (2 mm) 10/02/2020   Shortness of breath 08/12/2020   Renal insufficiency 08/12/2020   Pulmonary HTN (HCC) 08/12/2020   Cervicalgia 07/02/2020   Numbness  and tingling of upper extremity (Left) 07/02/2020   DDD (degenerative disc disease), cervical 07/02/2020   Cervical radiculopathy (sensory) (Left) 07/02/2020   Uncomplicated opioid dependence (HCC) 03/31/2020   Tricompartmental disease of knee 02/11/2020   Pharmacologic therapy 11/13/2019   Chronic low back pain (Bilateral) w/ sciatica (Bilateral) 08/20/2019   PTSD (post-traumatic stress disorder) 02/20/2019   Chronic musculoskeletal pain 02/20/2019    DDD (degenerative disc disease), lumbosacral 09/19/2018   Osteoarthritis of facet joint of lumbar spine 09/19/2018   Hypokalemia 09/10/2018   COPD (chronic obstructive pulmonary disease) with acute bronchitis (HCC) 08/13/2018   Transaminitis 08/13/2018   Acute pancreatitis 08/13/2018   Elevated LFTs 08/13/2018   Peptic ulcer disease 08/13/2018   COPD (chronic obstructive pulmonary disease) (HCC) 08/13/2018   Mass of chin 05/24/2018   Obesity (BMI 30.0-34.9) 12/05/2017   Primary localized osteoarthrosis, pelvic region and thigh 12/05/2017   Arthropathy of left hip 11/08/2017   Rib pain on right side 11/08/2017   Rectal bleeding 10/17/2017   Urinary retention 10/17/2017   Nicotine  dependence, unspecified, uncomplicated 10/17/2017   Spondylosis without myelopathy or radiculopathy, lumbosacral region 10/06/2017   Dizziness 09/21/2017   Vertigo 09/15/2017   Diabetes (HCC) 07/18/2017   HTN (hypertension) 07/18/2017   Chronic hip pain (2ry area of Pain) (Bilateral) (L>R) 05/02/2017   Type 2 diabetes mellitus with hyperglycemia (HCC) 04/14/2017   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) 03/15/2017   Skin lesion 11/25/2016   Hemarthrosis, left knee 11/10/2016   Dyspnea 09/20/2016   Osteoarthritis of knee (Bilateral) (L>R) 09/08/2016   Intermittent left thoracic Muscle cramps 09/08/2016   Spasm of thoracolumbar muscle (Left) 09/08/2016   Post-traumatic osteoarthritis of knee (Left) 09/08/2016   Lumbar spondylosis 07/05/2016   Chronic pain syndrome 06/28/2016   Elevated sedimentation rate 06/28/2016   Elevated C-reactive protein (CRP) 06/28/2016   Neurogenic pain 06/28/2016   Vitamin D  deficiency 06/28/2016   Long term (current) use of opiate analgesic 04/29/2016   Long term prescription opiate use 04/29/2016   Opiate use 04/29/2016   Chronic low back pain (1ry area of Pain) (Bilateral) (L>R) w/o sciatica 04/29/2016   Chronic knee pain (3ry area of Pain) (Bilateral) (L>R) 04/29/2016    Chronic neck pain (Bilateral) (R>L) 04/29/2016   Chronic upper back pain (midline) 04/29/2016   Chronic foot pain (bottom of feet) (Bilateral) (R>L) 04/29/2016   Peripheral neuropathy, idiopathic (upper and lower extremity) 04/29/2016   Chronic sacroiliac joint pain (Bilateral) (R>L) 04/29/2016   Lumbar facet syndrome (Bilateral) (R>L) 04/29/2016   Thrush, oral 03/08/2016   Depression 03/05/2016   Insomnia, unspecified 07/31/2015   Neuropathy 07/31/2015   Other specified dorsopathies, site unspecified 07/31/2015   Chronic diastolic heart failure (HCC) 07/16/2015   Tobacco use disorder 07/16/2015   Obstructive sleep apnea 07/16/2015   Tachycardia 07/16/2015   Congestive heart failure (HCC) 06/30/2015   COPD, mild (HCC)    Chest pain 06/14/2015   Hyponatremia 06/14/2015   Swelling    Cough    Rosacea 09/01/2011   PCP:  System, Provider Not In Pharmacy:   [id number] - [place], [place] - [address] [place] [address] Phone: [phone] Fax: [phone]  [place] [facility] [address] [place] [address] Phone: [phone] Fax: [phone]     Social Drivers of Health (SDOH) Social History: SDOH Screenings   Food Insecurity: No Food Insecurity (11/23/2023)  Housing: Low Risk  (  11/23/2023)  Transportation Needs: No Transportation Needs (11/23/2023)  Utilities: Not At Risk (11/23/2023)  Depression (PHQ2-9): Medium Risk (02/17/2023)  Financial Resource Strain: High Risk (07/18/2017)  Physical Activity: Insufficiently Active (07/18/2017)  Social Connections: Socially Isolated (07/18/2017)  Stress: Stress Concern Present (07/18/2017)  Tobacco Use: High Risk (11/23/2023)   SDOH Interventions:     Readmission Risk Interventions     No data to display

## 2023-11-24 NOTE — Progress Notes (Signed)
 Progress Note     Interval: Admitted overnight, no N/V, still having quite a bit of abdominal pain  Objective: Vital signs in last 24 hours: Temp:  [98.2 F (36.8 C)-99.9 F (37.7 C)] 99.9 F (37.7 C) (05/08 1149) Pulse Rate:  [103-122] 114 (05/08 1149) Resp:  [18-20] 18 (05/08 1149) BP: (105-177)/(63-110) 105/63 (05/08 1149) SpO2:  [93 %-99 %] 93 % (05/08 1149) Weight:  [77.1 kg] 77.1 kg (05/07 2155) Last BM Date : 11/23/23  Intake/Output from previous day: 05/07 0701 - 05/08 0700 In: 937.4 [P.O.:90; I.V.:544.7; IV Piggyback:302.7] Out: 1200 [Urine:1200] Intake/Output this shift: Total I/O In: -  Out: 600 [Urine:600]  PE: General: chronically deconditioned female, appears older than stated age Heart: regular, rate, and rhythm Lungs: Increased work of breathing Abd: tender to palpation in RUQ MS: all 4 extremities are symmetrical with no cyanosis, clubbing, or edema. Skin: warm and dry with no masses, lesions, or rashes Neuro: No focal neurologic deficits Psych: A&Ox3 with an appropriate affect.    Lab Results:  Recent Labs    11/23/23 1609 11/24/23 0258  WBC 17.2* 17.8*  HGB 15.1* 13.1  HCT 45.6 39.8  PLT 270 200   BMET Recent Labs    11/23/23 1609 11/24/23 0258  NA 126* 132*  K 4.0 3.4*  CL 90* 97*  CO2 22 24  GLUCOSE 195* 126*  BUN 11 11  CREATININE 0.78 0.74  CALCIUM  9.5 8.7*   PT/INR No results for input(s): "LABPROT", "INR" in the last 72 hours. CMP     Component Value Date/Time   NA 132 (L) 11/24/2023 0258   NA 140 04/22/2016 1532   NA 139 02/26/2014 1826   K 3.4 (L) 11/24/2023 0258   K 3.7 02/26/2014 1826   CL 97 (L) 11/24/2023 0258   CL 104 02/26/2014 1826   CO2 24 11/24/2023 0258   CO2 27 02/26/2014 1826   GLUCOSE 126 (H) 11/24/2023 0258   GLUCOSE 132 (H) 02/26/2014 1826   BUN 11 11/24/2023 0258   BUN 10 04/22/2016 1532   BUN 4 (L) 02/26/2014 1826   CREATININE 0.74 11/24/2023 0258   CREATININE 0.68 07/28/2023 1240    CREATININE 0.74 02/26/2014 1826   CALCIUM  8.7 (L) 11/24/2023 0258   CALCIUM  8.3 (L) 02/26/2014 1826   PROT 8.4 (H) 11/23/2023 1609   PROT 7.6 03/10/2013 0100   ALBUMIN 4.0 11/23/2023 1609   ALBUMIN 3.6 03/10/2013 0100   AST 25 11/23/2023 1609   AST 22 07/28/2023 1240   ALT 20 11/23/2023 1609   ALT 23 07/28/2023 1240   ALT 57 03/10/2013 0100   ALKPHOS 111 11/23/2023 1609   ALKPHOS 89 03/10/2013 0100   BILITOT 1.2 11/23/2023 1609   BILITOT 0.5 07/28/2023 1240   GFRNONAA >60 11/24/2023 0258   GFRNONAA >60 07/28/2023 1240   GFRNONAA >60 02/26/2014 1826   GFRAA >60 09/12/2018 0422   GFRAA >60 02/26/2014 1826   Lipase     Component Value Date/Time   LIPASE 25 11/23/2023 1609   LIPASE 90 02/10/2013 1114       Studies/Results: US  Abdomen Limited RUQ (LIVER/GB) Result Date: 11/23/2023 CLINICAL DATA:  Right upper quadrant pain EXAM: ULTRASOUND ABDOMEN LIMITED RIGHT UPPER QUADRANT COMPARISON:  CT abdomen and pelvis 11/22/2023. Abdominal ultrasound 08/12/2018. FINDINGS: Gallbladder: Gallbladder wall thickened measuring 4 mm. There is a small amount of pericholecystic fluid. Gallbladder calculi are seen measuring up to 6 mm. Sonographic Abigail Abler sign is negative. Common bile duct: Diameter: 3 mm Liver:  No focal lesion identified. Increased in parenchymal echogenicity. Portal vein is patent on color Doppler imaging with normal direction of blood flow towards the liver. Other: None. IMPRESSION: 1. Cholelithiasis with gallbladder wall thickening and pericholecystic fluid. Findings are concerning for acute cholecystitis. 2. Hepatic steatosis. Electronically Signed   By: Tyron Gallon M.D.   On: 11/23/2023 18:12   CT ABDOMEN PELVIS W CONTRAST Result Date: 11/22/2023 CLINICAL DATA:  Acute nonlocalized abdominal pain. History of lung and colon cancer. History of pancreatitis. EXAM: CT ABDOMEN AND PELVIS WITH CONTRAST TECHNIQUE: Multidetector CT imaging of the abdomen and pelvis was performed using the  standard protocol following bolus administration of intravenous contrast. RADIATION DOSE REDUCTION: This exam was performed according to the departmental dose-optimization program which includes automated exposure control, adjustment of the mA and/or kV according to patient size and/or use of iterative reconstruction technique. CONTRAST:  OMNIPAQUE  IOHEXOL  300 MG/ML  SOLN COMPARISON:  CT chest abdomen pelvis 11/01/2023 FINDINGS: Lower chest: No acute abnormality. Hepatobiliary: Hepatic steatosis. No focal hepatic lesion. Cholelithiasis without evidence of acute cholecystitis. No biliary dilation Pancreas: Unremarkable. No pancreatic ductal dilatation or surrounding inflammatory changes. Spleen: Unremarkable. Adrenals/Urinary Tract: Nodular thickening in both adrenal glands is stable dating back to 01/03/2023. No urinary calculi or hydronephrosis. Unremarkable bladder. Stomach/Bowel: Asymmetric wall thickening about the rectum greater on the left (circa series 2/image 69) is grossly similar to 11/01/2023. There is adjacent fat stranding and trace fluid. Otherwise no thickening of the wall of the small or large bowel. Stomach is within normal limits. The appendix is not visualized. No bowel obstruction. Vascular/Lymphatic: Aortic atherosclerosis. No enlarged abdominal or pelvic lymph nodes. Reproductive: Uterus and bilateral adnexa are unremarkable. Other: No free intraperitoneal air.  No abscess. Musculoskeletal: No acute fracture or destructive osseous lesion. IMPRESSION: 1. Stable asymmetric wall thickening about the rectum greater on the left with adjacent spiculation and edema. 2. Hepatic steatosis. 3. Cholelithiasis without evidence of acute cholecystitis. 4. Aortic Atherosclerosis (ICD10-I70.0). Electronically Signed   By: Rozell Cornet M.D.   On: 11/22/2023 23:45     Assessment/Plan 58 yo F with oligometastatic rectal cancer s/p chemo on xeloda  and radiation and complex medical history as outlined  in EMR included CHF, COPD, chronic smoker, fatty liver disease who presents with cholelithiasis with cholecystitis.  - Reviewed imaging, will be complex OR case, will postpone for today given limited OR time.  - Okay for regular diet today, NPO at midnight - IV antibiotics - Given severity of COPD and her history of CHF, now s/p chemo, and appearing chronically deconditioned, would appreciate cardiology and pulmonology evaluations for risk stratifications/optimization prior to taking to OR. Once better able to determine OR risk, may be better suited for perc chole tube rather than OR but do think reasonable to stratify before making definitive decision on OR or not.   LOS: 0 days   I reviewed hospitalist notes, last 24 h vitals and pain scores, last 48 h intake and output, last 24 h labs and trends, and last 24 h imaging results.  This care required moderate level of medical decision making.    Edmon Gosling, MD [facility] Surgery 11/24/2023, 1:00 PM Please see Amion for pager number during day hours 7:00am-4:30pm

## 2023-11-24 NOTE — Progress Notes (Addendum)
 Progress Note   Patient: Erica [name] ZOX:[id number] DOB: May 02, 1966 DOA: 11/23/2023     0 DOS: the patient was seen and examined on 11/24/2023   Brief hospital course: JESELLE KOSAKOWSKI is a 58 y.o. female with long h/o rectal cancer with oligometastatic disease on treatment with Xeloda  and radiation; chronic pain syndrome, gastric ulcers, DM2, HTN, HLD, OSA, CHF, COPD, chronic 1 pack/day smoking, fatty liver, anxiety/depression, arthritis. Patient presented to the ED today with complaint of epigastric pain radiating around back for 24-hour duration. She underwent CT scan showed cholelithiasis without cholecystitis.  Patient was discharged home only to return today with worsening symptoms. RUQ sono findings are concerning for acute cholecystitis admitted to [facility] service with general surgery consultation.  Assessment and Plan: Acute calculus cholecystitis She has right upper quadrant abdominal pain, tachycardia Uls RUQ showed stable cholelithiasis and acute cholecystitis WBC count elevated at 17k. Continue IV cefepime  and IV Flagyl.  Pain control, antiemetics. General surgery to take her to OR tomorrow. Requested Cardiology and pulmonary clearance. Monitor vitals closely. NPO past midnight.   Hyponatremia Sodium 132 stab;e/ Continue gentle IV fluids. Trend sodium.   Diastolic CHF Hypertension Blood pressure elevated to 170s in the ED Most recent echo from 2020 with EF 55 to 60%. PTA meds- Cardizem , Lasix , Aldactone  Resumed Cardizem .   She is euvolemic. Hold Lasix  and Aldactone .   Type 2 diabetes mellitus Peripheral neuropathy A1c 5.6, sugars stable. Hold Jardiance, metformin  Continue accucheks, sliding scale.  Lyrica  for neuropathy   HLD Continue Zetia   Rectal cancer with oligometastatic disease Ongoing treatment with Xeloda  and radiation Follows up with Dr. Maryalice Smaller   H/o gastric ulcers PPI, ?Carafate    Chronic pain Oxycodone  10 mg TID PRN, dilaudid  for severe pain.    Anxiety/depression Resumed BuSpar, Cymbalta , Klonopin,   COPD Chronic daily smoker OSA ??Chronic hypoxic respiratory failure Continues to smoke 1 pack/day States she is intermittently on oxygen at home No exacerbation. Albuterol  as needed. Continue Advair. Smoking cessation advised.   Out of bed to chair. Incentive spirometry. Nursing supportive care. Fall, aspiration precautions. Diet:  Diet Orders (From admission, onward)     Start     Ordered   11/24/23 1146  Diet heart healthy/carb modified Room service appropriate? Yes; Fluid consistency: Thin  Diet effective now       Question Answer Comment  Diet-HS Snack? Nothing   Room service appropriate? Yes   Fluid consistency: Thin      11/24/23 1145           DVT prophylaxis: enoxaparin  (LOVENOX ) injection 40 mg Start: 11/24/23 2000  Level of care: Med-Surg   Code Status: Full Code  Subjective: Patient is seen and examined today morning. She is lying in bed. Has right upper quadrant pain better with meds. Has nausea. No vomiting.  Physical Exam: Vitals:   11/24/23 0000 11/24/23 0352 11/24/23 0754 11/24/23 1149  BP: 109/68 126/70 128/69 105/63  Pulse: (!) 104 (!) 113 (!) 120 (!) 114  Resp: 19 20 19 18   Temp: 99.3 F (37.4 C) 99.4 F (37.4 C) 99.5 F (37.5 C) 99.9 F (37.7 C)  TempSrc: Oral Oral Oral   SpO2: 93% 95% 93% 93%  Weight:      Height:        General - Middle aged Caucasian female, distress due to nausea, pain. HEENT - PERRLA, EOMI, atraumatic head, non tender sinuses. Lung - Clear, basal rales, no rhonchi, wheezes. Heart - S1, S2 heard, no murmurs, rubs, trace  pedal edema. Abdomen - Soft, RUQ tender, non distended, bowel sounds good Neuro - Alert, awake and oriented x 3, non focal exam. Skin - Warm and dry.  Data Reviewed:      Latest Ref Rng & Units 11/24/2023    2:58 AM 11/23/2023    4:09 PM 11/22/2023    8:05 PM  CBC  WBC 4.0 - 10.5 K/uL 17.8  17.2  11.4   Hemoglobin 12.0 - 15.0 g/dL  13.2  44.0  10.2   Hematocrit 36.0 - 46.0 % 39.8  45.6  43.1   Platelets 150 - 400 K/uL 200  270  231       Latest Ref Rng & Units 11/24/2023    2:58 AM 11/23/2023    4:09 PM 11/22/2023    8:05 PM  BMP  Glucose 70 - 99 mg/dL 725  366  440   BUN 6 - 20 mg/dL 11  11  9    Creatinine 0.44 - 1.00 mg/dL 3.47  4.25  9.56   Sodium 135 - 145 mmol/L 132  126  136   Potassium 3.5 - 5.1 mmol/L 3.4  4.0  3.8   Chloride 98 - 111 mmol/L 97  90  99   CO2 22 - 32 mmol/L 24  22  21    Calcium  8.9 - 10.3 mg/dL 8.7  9.5  9.4    US  Abdomen Limited RUQ (LIVER/GB) Result Date: 11/23/2023 CLINICAL DATA:  Right upper quadrant pain EXAM: ULTRASOUND ABDOMEN LIMITED RIGHT UPPER QUADRANT COMPARISON:  CT abdomen and pelvis 11/22/2023. Abdominal ultrasound 08/12/2018. FINDINGS: Gallbladder: Gallbladder wall thickened measuring 4 mm. There is a small amount of pericholecystic fluid. Gallbladder calculi are seen measuring up to 6 mm. Sonographic Abigail Abler sign is negative. Common bile duct: Diameter: 3 mm Liver: No focal lesion identified. Increased in parenchymal echogenicity. Portal vein is patent on color Doppler imaging with normal direction of blood flow towards the liver. Other: None. IMPRESSION: 1. Cholelithiasis with gallbladder wall thickening and pericholecystic fluid. Findings are concerning for acute cholecystitis. 2. Hepatic steatosis. Electronically Signed   By: Tyron Gallon M.D.   On: 11/23/2023 18:12   CT ABDOMEN PELVIS W CONTRAST Result Date: 11/22/2023 CLINICAL DATA:  Acute nonlocalized abdominal pain. History of lung and colon cancer. History of pancreatitis. EXAM: CT ABDOMEN AND PELVIS WITH CONTRAST TECHNIQUE: Multidetector CT imaging of the abdomen and pelvis was performed using the standard protocol following bolus administration of intravenous contrast. RADIATION DOSE REDUCTION: This exam was performed according to the departmental dose-optimization program which includes automated exposure control, adjustment of  the mA and/or kV according to patient size and/or use of iterative reconstruction technique. CONTRAST:  OMNIPAQUE  IOHEXOL  300 MG/ML  SOLN COMPARISON:  CT chest abdomen pelvis 11/01/2023 FINDINGS: Lower chest: No acute abnormality. Hepatobiliary: Hepatic steatosis. No focal hepatic lesion. Cholelithiasis without evidence of acute cholecystitis. No biliary dilation Pancreas: Unremarkable. No pancreatic ductal dilatation or surrounding inflammatory changes. Spleen: Unremarkable. Adrenals/Urinary Tract: Nodular thickening in both adrenal glands is stable dating back to 01/03/2023. No urinary calculi or hydronephrosis. Unremarkable bladder. Stomach/Bowel: Asymmetric wall thickening about the rectum greater on the left (circa series 2/image 69) is grossly similar to 11/01/2023. There is adjacent fat stranding and trace fluid. Otherwise no thickening of the wall of the small or large bowel. Stomach is within normal limits. The appendix is not visualized. No bowel obstruction. Vascular/Lymphatic: Aortic atherosclerosis. No enlarged abdominal or pelvic lymph nodes. Reproductive: Uterus and bilateral adnexa are unremarkable. Other: No free  intraperitoneal air.  No abscess. Musculoskeletal: No acute fracture or destructive osseous lesion. IMPRESSION: 1. Stable asymmetric wall thickening about the rectum greater on the left with adjacent spiculation and edema. 2. Hepatic steatosis. 3. Cholelithiasis without evidence of acute cholecystitis. 4. Aortic Atherosclerosis (ICD10-I70.0). Electronically Signed   By: Rozell Cornet M.D.   On: 11/22/2023 23:45    Family Communication: Discussed with patient, she understand and agree. All questions answered.  Disposition: Status is: Observation The patient remains OBS appropriate and will d/c before 2 midnights.  Planned Discharge Destination: Home     Time spent: 38 minutes  Author: Aisha Hove, MD 11/24/2023 11:57 AM Secure chat 7am to 7pm For on call  review [email or web address].

## 2023-11-25 ENCOUNTER — Inpatient Hospital Stay (HOSPITAL_COMMUNITY)

## 2023-11-25 ENCOUNTER — Other Ambulatory Visit (HOSPITAL_COMMUNITY)

## 2023-11-25 DIAGNOSIS — E876 Hypokalemia: Secondary | ICD-10-CM

## 2023-11-25 DIAGNOSIS — K810 Acute cholecystitis: Secondary | ICD-10-CM | POA: Diagnosis not present

## 2023-11-25 DIAGNOSIS — G894 Chronic pain syndrome: Secondary | ICD-10-CM

## 2023-11-25 DIAGNOSIS — R079 Chest pain, unspecified: Secondary | ICD-10-CM | POA: Diagnosis not present

## 2023-11-25 DIAGNOSIS — J441 Chronic obstructive pulmonary disease with (acute) exacerbation: Secondary | ICD-10-CM

## 2023-11-25 DIAGNOSIS — I10 Essential (primary) hypertension: Secondary | ICD-10-CM

## 2023-11-25 DIAGNOSIS — I5032 Chronic diastolic (congestive) heart failure: Secondary | ICD-10-CM | POA: Diagnosis not present

## 2023-11-25 DIAGNOSIS — E871 Hypo-osmolality and hyponatremia: Secondary | ICD-10-CM | POA: Diagnosis not present

## 2023-11-25 DIAGNOSIS — R002 Palpitations: Secondary | ICD-10-CM

## 2023-11-25 DIAGNOSIS — R55 Syncope and collapse: Secondary | ICD-10-CM

## 2023-11-25 DIAGNOSIS — Z01810 Encounter for preprocedural cardiovascular examination: Secondary | ICD-10-CM

## 2023-11-25 DIAGNOSIS — E119 Type 2 diabetes mellitus without complications: Secondary | ICD-10-CM

## 2023-11-25 HISTORY — PX: IR PERC CHOLECYSTOSTOMY: IMG2326

## 2023-11-25 LAB — CBC
HCT: 37.1 % (ref 36.0–46.0)
Hemoglobin: 11.9 g/dL — ABNORMAL LOW (ref 12.0–15.0)
MCH: 32 pg (ref 26.0–34.0)
MCHC: 32.1 g/dL (ref 30.0–36.0)
MCV: 99.7 fL (ref 80.0–100.0)
Platelets: 166 10*3/uL (ref 150–400)
RBC: 3.72 MIL/uL — ABNORMAL LOW (ref 3.87–5.11)
RDW: 17 % — ABNORMAL HIGH (ref 11.5–15.5)
WBC: 17.5 10*3/uL — ABNORMAL HIGH (ref 4.0–10.5)
nRBC: 0 % (ref 0.0–0.2)

## 2023-11-25 LAB — GLUCOSE, CAPILLARY
Glucose-Capillary: 143 mg/dL — ABNORMAL HIGH (ref 70–99)
Glucose-Capillary: 123 mg/dL — ABNORMAL HIGH (ref 70–99)
Glucose-Capillary: 184 mg/dL — ABNORMAL HIGH (ref 70–99)

## 2023-11-25 LAB — PROTIME-INR
INR: 1.1 (ref 0.8–1.2)
Prothrombin Time: 14.7 s (ref 11.4–15.2)

## 2023-11-25 LAB — BASIC METABOLIC PANEL WITH GFR
Anion gap: 12 (ref 5–15)
BUN: 16 mg/dL (ref 6–20)
CO2: 20 mmol/L — ABNORMAL LOW (ref 22–32)
Calcium: 8.5 mg/dL — ABNORMAL LOW (ref 8.9–10.3)
Chloride: 105 mmol/L (ref 98–111)
Creatinine, Ser: 0.73 mg/dL (ref 0.44–1.00)
GFR, Estimated: >60 mL/min (ref >60)
Glucose, Bld: 128 mg/dL — ABNORMAL HIGH (ref 70–99)
Potassium: 3.3 mmol/L — ABNORMAL LOW (ref 3.5–5.1)
Sodium: 137 mmol/L (ref 135–145)

## 2023-11-25 LAB — LACTIC ACID, PLASMA: Lactic Acid, Venous: 0.7 mmol/L (ref 0.5–1.9)

## 2023-11-25 LAB — HEPATIC FUNCTION PANEL
ALT: 18 U/L (ref 0–44)
AST: 19 U/L (ref 15–41)
Albumin: 2.8 g/dL — ABNORMAL LOW (ref 3.5–5.0)
Alkaline Phosphatase: 87 U/L (ref 38–126)
Bilirubin, Direct: 0.3 mg/dL — ABNORMAL HIGH (ref 0.0–0.2)
Indirect Bilirubin: 0.9 mg/dL (ref 0.3–0.9)
Total Bilirubin: 1.2 mg/dL (ref 0.0–1.2)
Total Protein: 6.6 g/dL (ref 6.5–8.1)

## 2023-11-25 LAB — MAGNESIUM: Magnesium: 2.2 mg/dL (ref 1.7–2.4)

## 2023-11-25 LAB — PROCALCITONIN: Procalcitonin: 1.4 ng/mL

## 2023-11-25 LAB — PHOSPHORUS: Phosphorus: 3.5 mg/dL (ref 2.5–4.6)

## 2023-11-25 MED ORDER — ALTEPLASE 2 MG IJ SOLR
2.0000 mg | Freq: Once | INTRAMUSCULAR | Status: AC
  Administered 2023-11-25: 2 mg
  Filled 2023-11-25: qty 2

## 2023-11-25 MED ORDER — MIDAZOLAM HCL 2 MG/2ML IJ SOLN
INTRAMUSCULAR | Status: AC
  Filled 2023-11-25: qty 2

## 2023-11-25 MED ORDER — LIDOCAINE HCL 1 % IJ SOLN
INTRAMUSCULAR | Status: AC
  Filled 2023-11-25: qty 20

## 2023-11-25 MED ORDER — FENTANYL CITRATE (PF) 100 MCG/2ML IJ SOLN
INTRAMUSCULAR | Status: AC
  Filled 2023-11-25: qty 2

## 2023-11-25 MED ORDER — IOHEXOL 300 MG/ML  SOLN: 50.0000 mL | Freq: Once | INTRAMUSCULAR | Status: DC | PRN

## 2023-11-25 MED ORDER — POTASSIUM CHLORIDE 10 MEQ/100ML IV SOLN
10.0000 meq | INTRAVENOUS | Status: AC
  Administered 2023-11-25 (×2): 10 meq via INTRAVENOUS
  Filled 2023-11-25 (×2): qty 100

## 2023-11-25 MED ORDER — ENOXAPARIN SODIUM 40 MG/0.4ML IJ SOSY
40.0000 mg | PREFILLED_SYRINGE | INTRAMUSCULAR | Status: DC
  Administered 2023-11-26 – 2023-11-27 (×2): 40 mg via SUBCUTANEOUS
  Filled 2023-11-25 (×2): qty 0.4

## 2023-11-25 MED ORDER — FENTANYL CITRATE (PF) 100 MCG/2ML IJ SOLN
INTRAMUSCULAR | Status: AC | PRN
  Administered 2023-11-25 (×3): 50 ug via INTRAVENOUS

## 2023-11-25 MED ORDER — LIDOCAINE HCL 1 % IJ SOLN
20.0000 mL | Freq: Once | INTRAMUSCULAR | Status: AC
  Administered 2023-11-25: 15 mL via INTRADERMAL

## 2023-11-25 MED ORDER — SODIUM CHLORIDE 0.9% FLUSH
5.0000 mL | Freq: Three times a day (TID) | INTRAVENOUS | Status: DC
  Administered 2023-11-25 – 2023-11-26 (×3): 5 mL

## 2023-11-25 MED ORDER — MIDAZOLAM HCL 2 MG/2ML IJ SOLN
INTRAMUSCULAR | Status: AC | PRN
  Administered 2023-11-25 (×3): 1 mg via INTRAVENOUS

## 2023-11-25 MED ORDER — MIDAZOLAM HCL 2 MG/2ML IJ SOLN
INTRAMUSCULAR | Status: AC
Start: 2023-11-25 — End: ?
  Filled 2023-11-25: qty 2

## 2023-11-25 NOTE — Progress Notes (Signed)
 Pt's port not giving blood return despite troubleshooting. Port de-accessed and re-accessed, but continued to lack blood return. Cath-Flo ordered.

## 2023-11-25 NOTE — Progress Notes (Addendum)
 Progress Note     Interval: Ongoing abdominal discomfort, denies nausea or vomiting.  Reports admission to [facility] 4-5 years ago with what sounds like abd pain, nausea, vomiting and possible choledocholithiasis   Objective: Vital signs in last 24 hours: Temp:  [97.5 F (36.4 C)-100.2 F (37.9 C)] 97.5 F (36.4 C) (05/09 0554) Pulse Rate:  [84-114] 84 (05/09 0554) Resp:  [15-18] 15 (05/09 0554) BP: (105-131)/(63-81) 113/66 (05/09 0554) SpO2:  [91 %-93 %] 93 % (05/09 0732) Last BM Date : 11/24/23  Intake/Output from previous day: 05/08 0701 - 05/09 0700 In: 1013.6 [P.O.:310; I.V.:203.7; IV Piggyback:499.9] Out: 2000 [Urine:2000] Intake/Output this shift: No intake/output data recorded.  PE: General: chronically deconditioned female, appears older than stated age Heart: regular, rate, and rhythm Lungs: Increased work of breathing Abd: tender to light palpation of the epigastric region and RUQ with a palpable gallbladder, no hernias MS: all 4 extremities are symmetrical with no cyanosis, clubbing, or edema. Skin: warm and dry with no masses, lesions, or rashes Neuro: No focal neurologic deficits Psych: A&Ox3 with an appropriate affect.    Lab Results:  Recent Labs    11/24/23 0258 11/25/23 0354  WBC 17.8* 17.5*  HGB 13.1 11.9*  HCT 39.8 37.1  PLT 200 166   BMET Recent Labs    11/24/23 0258 11/25/23 0354  NA 132* 137  K 3.4* 3.3*  CL 97* 105  CO2 24 20*  GLUCOSE 126* 128*  BUN 11 16  CREATININE 0.74 0.73  CALCIUM  8.7* 8.5*   PT/INR No results for input(s): "LABPROT", "INR" in the last 72 hours. CMP     Component Value Date/Time   NA 137 11/25/2023 0354   NA 140 04/22/2016 1532   NA 139 02/26/2014 1826   K 3.3 (L) 11/25/2023 0354   K 3.7 02/26/2014 1826   CL 105 11/25/2023 0354   CL 104 02/26/2014 1826   CO2 20 (L) 11/25/2023 0354   CO2 27 02/26/2014 1826   GLUCOSE 128 (H) 11/25/2023 0354   GLUCOSE 132 (H) 02/26/2014 1826   BUN 16 11/25/2023 0354    BUN 10 04/22/2016 1532   BUN 4 (L) 02/26/2014 1826   CREATININE 0.73 11/25/2023 0354   CREATININE 0.68 07/28/2023 1240   CREATININE 0.74 02/26/2014 1826   CALCIUM  8.5 (L) 11/25/2023 0354   CALCIUM  8.3 (L) 02/26/2014 1826   PROT 6.6 11/25/2023 0354   PROT 7.6 03/10/2013 0100   ALBUMIN 2.8 (L) 11/25/2023 0354   ALBUMIN 3.6 03/10/2013 0100   AST 19 11/25/2023 0354   AST 22 07/28/2023 1240   ALT 18 11/25/2023 0354   ALT 23 07/28/2023 1240   ALT 57 03/10/2013 0100   ALKPHOS 87 11/25/2023 0354   ALKPHOS 89 03/10/2013 0100   BILITOT 1.2 11/25/2023 0354   BILITOT 0.5 07/28/2023 1240   GFRNONAA >60 11/25/2023 0354   GFRNONAA >60 07/28/2023 1240   GFRNONAA >60 02/26/2014 1826   GFRAA >60 09/12/2018 0422   GFRAA >60 02/26/2014 1826   Lipase     Component Value Date/Time   LIPASE 25 11/23/2023 1609   LIPASE 90 02/10/2013 1114       Studies/Results: US  Abdomen Limited RUQ (LIVER/GB) Result Date: 11/23/2023 CLINICAL DATA:  Right upper quadrant pain EXAM: ULTRASOUND ABDOMEN LIMITED RIGHT UPPER QUADRANT COMPARISON:  CT abdomen and pelvis 11/22/2023. Abdominal ultrasound 08/12/2018. FINDINGS: Gallbladder: Gallbladder wall thickened measuring 4 mm. There is a small amount of pericholecystic fluid. Gallbladder calculi are seen measuring up to 6  mm. Sonographic Murphy sign is negative. Common bile duct: Diameter: 3 mm Liver: No focal lesion identified. Increased in parenchymal echogenicity. Portal vein is patent on color Doppler imaging with normal direction of blood flow towards the liver. Other: None. IMPRESSION: 1. Cholelithiasis with gallbladder wall thickening and pericholecystic fluid. Findings are concerning for acute cholecystitis. 2. Hepatic steatosis. Electronically Signed   By: Tyron Gallon M.D.   On: 11/23/2023 18:12     Assessment/Plan Calculous cholecystitis 58 yo F with oligometastatic rectal cancer s/p chemo on xeloda  and radiation and complex medical history as outlined in  EMR included CHF, COPD, chronic smoker, fatty liver disease who presents with cholelithiasis with cholecystitis.  - afebrile, WBC 17.5, LFT's are unremarkable - reviewed history, imaging, and consult notes from pulmonary critical care and cardiology. Her history and exam are consistent with calculous cholecystitis. Given her COPD exacerbation and bronchitis she carries ~10% risk of prolonged mechanical ventilation after an operation. Cardiology gives her a 10% risk of perioperative cardiac complication. With this information I think the safest treatment option at this time is percutaneous cholecystostomy tube. I spoke to IR physician Dr. Julietta Ogren who agrees she is a candidate for this procedure.  - IV antibiotics  - NPO for possible procedure    LOS: 1 day   I reviewed hospitalist notes, last 24 h vitals and pain scores, last 48 h intake and output, last 24 h labs and trends, and last 24 h imaging results.  This care required moderate level of medical decision making.    Charlott Converse, [facility] Surgery 11/25/2023, 10:25 AM Please see Amion for pager number during day hours 7:00am-4:30pm

## 2023-11-25 NOTE — Progress Notes (Signed)
 MEDICATION-RELATED CONSULT NOTE  IR Procedure Consult - Anticoagulant/Antiplatelet PTA/Inpatient Med List Review by Pharmacist   Procedure: IR Perc Cholecystostomy    Completed: 11/25/2023, 15:00  Post-Procedural bleeding risk per IR MD assessment:  Standard   Antithrombotic medications on inpatient or PTA profile prior to procedure:   Enoxaparin  40mg  SQ q24h.  Last dose 5/8 at 20:00.    Recommended restart time per IR Post-Procedure Guidelines:  Day + 1 (Next AM)    Plan:  Resume Enoxaparin  40mg  SQ q24h on 5/10 at 10am   [facility] PharmD, BCPS [facility] main pharmacy [phone] 11/25/2023 5:55 PM

## 2023-11-25 NOTE — Progress Notes (Signed)
 Secure chat to primary nurse to plan time for TPA dose due to pt has scheduled IV medications. Awaiting response.

## 2023-11-25 NOTE — Progress Notes (Signed)
 [facility]                                                                              Erica [name], is a 58 y.o. female, DOB - 1965/07/20, UJW:[id number] Admit date - 11/23/2023    Outpatient Primary MD for the patient is System, Provider Not In  LOS - 1  days  Chief Complaint  Patient presents with   Abdominal Pain    Radiates to back       Brief summary   Patient is a 58 year old female with rectal cancer, metastatic disease on treatment with Xeloda  and XRT, chronic pain syndrome, gastric ulcer, DM 2, HTN, HLP, OSA, CHF, COPD, tobacco use, 1 pack/day, fatty liver, anxiety/depression, arthritis presented to ED with epigastric pain radiating around her back for at least a day prior to admission and not responding to oxycodone .  She underwent CT scan which showed cholelithiasis without cholecystitis.  Patient was discharged home from ED only to return back with worsening symptoms. RUQ US  showed hepatic steatosis, cholelithiasis with gallbladder wall thickening and pericholecystic fluid concerning for acute cholecystitis. General surgery consulted.   Assessment & Plan    Principal Problem: SIRS with Acute calculus cholecystitis -Presented with acute abdominal pain, tachycardia, leukocytosis, procalcitonin 1.4.  No lactic acidosis.  Source likely due to acute calculus cholecystitis - General Surgery consulted, plan for or TBD pending cardiology evaluation - Continue IV fluids, pain control, antiemetics as needed  Active Problems:  Acute hyponatremia - Presented with sodium of 126, acute - Placed on gentle hydration, improved, sodium 137     Chronic pain syndrome, chronic back pain - Continue oxycodone     Controlled type 2 diabetes mellitus without complication, without long-term current use of insulin  (HCC) -Hold PTA meds including Jardiance, metformin  - Hemoglobin A1c 5.6 - Continue sliding scale insulin  while inpatient  COPD with mild exacerbation,  acute bronchitis Chronic respiratory failure with hypoxia OSA Chronic tobacco use -  reporting new productive cough and progressive dyspnea - Seen by pulmonology, continue Brovana , budesonide , Yupelri  in the perioperative.  - On IV Solu-Medrol  - Counseled on smoking cessation    HTN (hypertension) -BP stable     Hypokalemia -Replaced    Rectal cancer (HCC) with metastatic disease - Ongoing treatment with Xeloda  and radiation, follows up with Dr. Grayland Le  History of gastric ulcers, PUD - Continue PPI  Obesity class I Estimated body mass index is 25.84 kg/m as calculated from the following:   Height as of this encounter: 5\' 8"  (1.727 m).   Weight as of this encounter: 77.1 kg.  Code Status: Full code DVT Prophylaxis:     Level of Care: Level of care: Telemetry Family Communication: Updated patient Disposition Plan:      Remains inpatient appropriate: Awaiting cholecystectomy, timing of OR TBD, follow 2D echo   Procedures:    Consultants:   General Surgery Pulmonology Cardiology  Antimicrobials:   Anti-infectives (From admission, onward)    Start     Dose/Rate Route Frequency Ordered Stop   11/24/23 0800  metroNIDAZOLE  (FLAGYL ) IVPB 500 mg        500 mg 100 mL/hr over  60 Minutes Intravenous Every 12 hours 11/23/23 2208     11/24/23 0400  ceFEPIme  (MAXIPIME ) 2 g in sodium chloride  0.9 % 100 mL IVPB        2 g 200 mL/hr over 30 Minutes Intravenous Every 8 hours 11/23/23 2208     11/23/23 1900  ceFEPIme  (MAXIPIME ) 2 g in sodium chloride  0.9 % 100 mL IVPB       Placed in "And" Linked Group   2 g 200 mL/hr over 30 Minutes Intravenous  Once 11/23/23 1858 11/24/23 1936   11/23/23 1900  metroNIDAZOLE  (FLAGYL ) IVPB 500 mg       Placed in "And" Linked Group   500 mg 100 mL/hr over 60 Minutes Intravenous  Once 11/23/23 1858 11/24/23 1937          Medications  arformoterol   15 mcg Nebulization BID   budesonide  (PULMICORT ) nebulizer solution  0.5 mg Nebulization  BID   busPIRone   30 mg Oral BID   Chlorhexidine  Gluconate Cloth  6 each Topical Daily   diltiazem   120 mg Oral Daily   DULoxetine   60 mg Oral BID   insulin  aspart  0-5 Units Subcutaneous QHS   insulin  aspart  0-9 Units Subcutaneous TID WC   methylPREDNISolone  (SOLU-MEDROL ) injection  20 mg Intravenous Daily   pantoprazole   40 mg Oral Daily   pregabalin   150 mg Oral TID   revefenacin   175 mcg Nebulization Daily      Subjective:   Erica [name] was seen and examined today.  No acute complaints.  Awaiting OR.  Still has RUQ US , no acute nausea vomiting, chest pain or shortness of breath.  Objective:   Vitals:   11/24/23 2137 11/25/23 0554 11/25/23 0634 11/25/23 0732  BP: 131/81 113/66    Pulse: (!) 105 84    Resp: 17 15    Temp: 100.2 F (37.9 C) (!) 97.5 F (36.4 C)    TempSrc: Oral Oral    SpO2: 92% 91% 93% 93%  Weight:      Height:        Intake/Output Summary (Last 24 hours) at 11/25/2023 1024 Last data filed at 11/25/2023 0600 Gross per 24 hour  Intake 1013.64 ml  Output 2000 ml  Net -986.36 ml     Wt Readings from Last 3 Encounters:  11/23/23 77.1 kg  11/17/23 77.5 kg  10/27/23 71.5 kg     Exam General: Alert and oriented x 3, NAD Cardiovascular: S1 S2 auscultated,  RRR Respiratory: Decreased breath sounds at the bases Gastrointestinal: Soft, RUQ TTP, NBS Ext: no pedal edema bilaterally Neuro: Strength 5/5 upper and lower extremities bilaterally Psych: Normal affect     Data Reviewed:  I have personally reviewed following labs    CBC Lab Results  Component Value Date   WBC 17.5 (H) 11/25/2023   RBC 3.72 (L) 11/25/2023   HGB 11.9 (L) 11/25/2023   HCT 37.1 11/25/2023   MCV 99.7 11/25/2023   MCH 32.0 11/25/2023   PLT 166 11/25/2023   MCHC 32.1 11/25/2023   RDW 17.0 (H) 11/25/2023   LYMPHSABS 0.5 (L) 11/23/2023   MONOABS 1.0 11/23/2023   EOSABS 0.0 11/23/2023   BASOSABS 0.1 11/23/2023     Last metabolic panel Lab Results  Component Value  Date   NA 137 11/25/2023   K 3.3 (L) 11/25/2023   CL 105 11/25/2023   CO2 20 (L) 11/25/2023   BUN 16 11/25/2023   CREATININE 0.73 11/25/2023   GLUCOSE 128 (H) 11/25/2023  GFRNONAA >60 11/25/2023   GFRAA >60 09/12/2018   CALCIUM  8.5 (L) 11/25/2023   PHOS 3.5 11/25/2023   PROT 6.6 11/25/2023   ALBUMIN 2.8 (L) 11/25/2023   LABGLOB 3.3 06/19/2015   AGRATIO 0.7 06/19/2015   BILITOT 1.2 11/25/2023   ALKPHOS 87 11/25/2023   AST 19 11/25/2023   ALT 18 11/25/2023   ANIONGAP 12 11/25/2023    CBG (last 3)  Recent Labs    11/24/23 1147 11/24/23 2134 11/25/23 0807  GLUCAP 118* 149* 143*      Coagulation Profile: No results for input(s): "INR", "PROTIME" in the last 168 hours.   Radiology Studies: I have personally reviewed the imaging studies  US  Abdomen Limited RUQ (LIVER/GB) Result Date: 11/23/2023 CLINICAL DATA:  Right upper quadrant pain EXAM: ULTRASOUND ABDOMEN LIMITED RIGHT UPPER QUADRANT COMPARISON:  CT abdomen and pelvis 11/22/2023. Abdominal ultrasound 08/12/2018. FINDINGS: Gallbladder: Gallbladder wall thickened measuring 4 mm. There is a small amount of pericholecystic fluid. Gallbladder calculi are seen measuring up to 6 mm. Sonographic Abigail Abler sign is negative. Common bile duct: Diameter: 3 mm Liver: No focal lesion identified. Increased in parenchymal echogenicity. Portal vein is patent on color Doppler imaging with normal direction of blood flow towards the liver. Other: None. IMPRESSION: 1. Cholelithiasis with gallbladder wall thickening and pericholecystic fluid. Findings are concerning for acute cholecystitis. 2. Hepatic steatosis. Electronically Signed   By: Tyron Gallon M.D.   On: 11/23/2023 18:12       Rayyan Orsborn M.D. [facility] 11/25/2023, 10:24 AM  Available via Epic secure chat 7am-7pm After 7 pm, please refer to night coverage provider listed on amion.

## 2023-11-25 NOTE — Consult Note (Signed)
 NAME:  Erica [name], MRN:  [id number], DOB:  [birth year], LOS: 1 ADMISSION DATE:  11/23/2023, CONSULTATION DATE:  5/8 REFERRING MD: Dr Butch Cashing, CHIEF COMPLAINT: Cholecystitis  History of Present Illness:  58 year old female with past medical history as below, which is significant for rectal cancer with oligometastatic disease, diabetes, gastric ulcers, OSA, CHF, and COPD.  She is a current 1 pack/day smoker and has been for 45 years.  Her COPD is managed by the [facility] clinic in [place] and she is prescribed Advair and Spiriva .  She has not taken Spiriva  for approximately 1 year she has been unable to get the prescription refilled.  She reports that at baseline she is able to do most activities of daily living without becoming short of breath.  She is able to walk the grocery store without difficulty.  There are some times when she is not feeling well that she needs to take her portable oxygen with her, however, this is pretty infrequent.  She does wear oxygen 2 L most nights.  She also reports about 2 to 3 weeks of cough productive for green sputum.  She has not sought medical attention and attributed this to bronchitis.  She presented to [facility] emergency department 5/6 with complaints of epigastric pain radiating to her back for 24 hours.  CT showed cholelithiasis without cholecystitis and she was discharged home.  She presented again 5/7.  She had a ultrasound of the right upper quadrant which was more concerning for acute cholecystitis.  She was admitted to the hospitalist service and surgery was consulted.  Empiric antibiotics were started. There are plans for cholecystectomy and PCCM has been asked to provide pre-op pulmonary evaluation.   Pertinent  Medical History   has a past medical history of (HFpEF) heart failure with preserved ejection fraction (HCC), Acute pancreatitis (08/13/2018), Anxiety, Arthritis, Asthma, Bell's palsy, Bronchitis, Cancer (HCC) (12/2022), CHF (congestive heart  failure) (HCC), COPD (chronic obstructive pulmonary disease) (HCC), Depression, Diabetes mellitus without complication (HCC), Dyspnea, Fatty liver, Gall stones, Gastric ulcer, Hyperlipidemia, Hypertension, Lung nodule (12/2022), OSA (obstructive sleep apnea), Pancreatitis, Shoulder injury, and Smoker.   Significant Hospital Events: Including procedures, antibiotic start and stop dates in addition to other pertinent events     Interim History / Subjective:    Objective    Blood pressure 113/71, pulse 79, temperature 97.9 F (36.6 C), temperature source Oral, resp. rate 16, height 5\' 8"  (1.727 m), weight 77.1 kg, last menstrual period 03/13/2015, SpO2 93%.        Intake/Output Summary (Last 24 hours) at 11/25/2023 1538 Last data filed at 11/25/2023 1504 Gross per 24 hour  Intake 1012.08 ml  Output 2200 ml  Net -1187.92 ml   Filed Weights   11/23/23 2155  Weight: 77.1 kg    Examination: General: adult female appears older than stated age.  HENT: Van/AT, PERRL, no JVD Lungs: coarse crackles in bilateral bases. Otherwise clear, no wheeze.  Cardiovascular: RRR, no MRG Abdomen: Soft, tender RUQ Extremities: No acute deformity or ROM limitation Neuro: Alert, oriented, non-focal  Resolved Hospital Problem list     Assessment & Plan:   COPD with acute exacerbation Acute bronchitis - continue nebulized Brovana , budesonide , and Yupelri  in the perioperative period to optimize pulmonary status - advised to quit smoking - Current antibiotics will cover.  -follow up sputum cultures - recommend transition back to her home advair diskus 250-50mcg 1 puff twice daily with the addition of spiriva  or incruse daily.   Cholecystitis: - Management  per surgery - IR to place perc chole drain today due to operative pulmonary and cardiovascular risks - Empiric antibiotics  PCCM will sign off.  Best Practice (right click and "Reselect all SmartList Selections" daily)   Per primary  Labs    CBC: Recent Labs  Lab 11/22/23 2005 11/23/23 1609 11/24/23 0258 11/25/23 0354  WBC 11.4* 17.2* 17.8* 17.5*  NEUTROABS  --  15.5*  --   --   HGB 13.5 15.1* 13.1 11.9*  HCT 43.1 45.6 39.8 37.1  MCV 102.1* 96.2 95.7 99.7  PLT 231 270 200 166    Basic Metabolic Panel: Recent Labs  Lab 11/22/23 2005 11/23/23 1609 11/24/23 0258 11/25/23 0354  NA 136 126* 132* 137  K 3.8 4.0 3.4* 3.3*  CL 99 90* 97* 105  CO2 21* 22 24 20*  GLUCOSE 158* 195* 126* 128*  BUN 9 11 11 16   CREATININE 0.65 0.78 0.74 0.73  CALCIUM  9.4 9.5 8.7* 8.5*  MG  --   --   --  2.2  PHOS  --   --   --  3.5   GFR: Estimated Creatinine Clearance: 82.7 mL/min (by C-G formula based on SCr of 0.73 mg/dL). Recent Labs  Lab 11/22/23 2005 11/23/23 1609 11/24/23 0258 11/25/23 0354  PROCALCITON  --   --   --  1.40  WBC 11.4* 17.2* 17.8* 17.5*  LATICACIDVEN  --   --   --  0.7    Liver Function Tests: Recent Labs  Lab 11/22/23 2005 11/23/23 1609 11/25/23 0354  AST 27 25 19   ALT 19 20 18   ALKPHOS 95 111 87  BILITOT 0.4 1.2 1.2  PROT 7.7 8.4* 6.6  ALBUMIN 3.6 4.0 2.8*   Recent Labs  Lab 11/22/23 2005 11/23/23 1609  LIPASE 23 25   No results for input(s): "AMMONIA" in the last 168 hours.  ABG    Component Value Date/Time   PHART 7.49 (H) 06/20/2015 0500   PCO2ART 25 (L) 06/20/2015 0500   PO2ART 72 (L) 06/20/2015 0500   HCO3 19.1 (L) 06/20/2015 0500   ACIDBASEDEF 2.6 (H) 06/20/2015 0500   O2SAT 95.5 06/20/2015 0500     Coagulation Profile: Recent Labs  Lab 11/25/23 1136  INR 1.1    Cardiac Enzymes: No results for input(s): "CKTOTAL", "CKMB", "CKMBINDEX", "TROPONINI" in the last 168 hours.  HbA1C: Hgb A1c MFr Bld  Date/Time Value Ref Range Status  11/23/2023 04:09 PM 5.6 4.8 - 5.6 % Final    Comment:    (NOTE) Pre diabetes:          5.7%-6.4%  Diabetes:              >6.4%  Glycemic control for   <7.0% adults with diabetes   09/11/2018 05:27 AM 6.0 (H) 4.8 - 5.6 % Final     Comment:    (NOTE) Pre diabetes:          5.7%-6.4% Diabetes:              >6.4% Glycemic control for   <7.0% adults with diabetes     CBG: Recent Labs  Lab 11/24/23 0752 11/24/23 1147 11/24/23 2134 11/25/23 0807 11/25/23 1200  GLUCAP 132* 118* 149* 143* 123*    Critical care time:      Duaine German, MD [facility] Office: [phone]   See Amion for personal pager PCCM on call pager [phone] until 7pm. Please call Elink 7p-7a. [phone]

## 2023-11-25 NOTE — Consult Note (Signed)
 Chief Complaint: Epigastric/right upper quadrant abdominal pain, acute cholecystitis, poor surgical candidate; referred for percutaneous cholecystostomy  Referring Provider(s): Burton,V  Supervising Physician: Elene Griffes  Patient Status: [facility] - In-pt  History of Present Illness: Erica [name] is a 58 y.o. female smoker with past medical history significant for heart failure, pancreatitis, anxiety, arthritis, asthma, metastatic rectal cancer, CHF, COPD, depression, diabetes, fatty liver, gastric ulcer, hypertension, hyperlipidemia, sleep apnea who was admitted to [facility] on 5/7 with persistent right upper quadrant/epigastric pain. Previous CT scan on 5/6 had revealed cholelithiasis without evidence of acute cholecystitis.  Due to persistent abdominal pain patient underwent ultrasound abdomen on 5/7 which revealed gallstones with gallbladder wall thickening and pericholecystic fluid concerning for acute cholecystitis.  Patient was evaluated by surgery and deemed a poor candidate for cholecystectomy.  Request now received for percutaneous cholecystostomy.  She is currently afebrile, WBC 17.5, hemoglobin 11.9, platelets normal, creatinine normal, total bilirubin normal, PT/INR pending.  She is on IV Maxipime /Flagyl .      Patient is Full Code  Past Medical History:  Diagnosis Date   (HFpEF) heart failure with preserved ejection fraction (HCC)    a. 05/2015 Echo: EF 60-65%, no rwma, PASP .   Acute pancreatitis 08/13/2018   Anxiety    Arthritis    Asthma    Bell's palsy    no deficit   Bronchitis    Cancer (HCC) 12/2022   rectal cancer   CHF (congestive heart failure) (HCC)    COPD (chronic obstructive pulmonary disease) (HCC)    Depression    Diabetes mellitus without complication (HCC)    type II 03/2017   Dyspnea    Fatty liver    per patient   Gall stones    per patient   Gastric ulcer    Hyperlipidemia    Hypertension    Lung nodule 12/2022   upper  left   OSA (obstructive sleep apnea)    a. did not tolerate CPAP. On oxygen 2L via [place]   Pancreatitis    07/2018   Shoulder injury    6/19   Smoker     Past Surgical History:  Procedure Laterality Date   BRONCHIAL NEEDLE ASPIRATION BIOPSY  01/27/2023   Procedure: BRONCHIAL NEEDLE ASPIRATION BIOPSIES;  Surgeon: Vergia Glasgow, MD;  Location: [facility] ENDOSCOPY;  Service: Pulmonary;;   COLONOSCOPY WITH PROPOFOL  N/A 12/15/2022   Procedure: COLONOSCOPY WITH PROPOFOL ;  Surgeon: Luke Salaam, MD;  Location[facility] ENDOSCOPY;  Service: Gastroenterology;  Laterality: N/A;   COLONOSCOPY WITH PROPOFOL  N/A 12/16/2022   Procedure: COLONOSCOPY WITH PROPOFOL ;  Surgeon: Luke Salaam, MD;  Location: [facility] ENDOSCOPY;  Service: Gastroenterology;  Laterality: N/A;   FLEXIBLE BRONCHOSCOPY N/A 06/20/2015   Procedure: FLEXIBLE BRONCHOSCOPY;  Surgeon: Enos Harts, MD;  Location: [facility] ORS;  Service: Pulmonary;  Laterality: N/A;   IR IMAGING GUIDED PORT INSERTION  02/25/2023   KNEE SURGERY Left    8 knee surgeries    Allergies: Tramadol and Penicillins  Medications: Prior to Admission medications   Medication Sig Start Date End Date Taking? Authorizing Provider  albuterol  (VENTOLIN  HFA) 108 (90 Base) MCG/ACT inhaler Inhale 2 puffs into the lungs every 6 (six) hours as needed for wheezing or shortness of breath.    Yes [provider]  busPIRone  (BUSPAR ) 30 MG tablet Take 30 mg by mouth 2 (two) times daily. PER PT STATES HER DOSE WAS INCREASED 02/14/20  Yes [provider]  clonazePAM  (KLONOPIN ) 0.5 MG tablet Take 0.5 mg by  mouth 2 (two) times daily. 03/12/20  Yes [provider]  cyclobenzaprine  (FLEXERIL ) 10 MG tablet Take 1 tablet (10 mg total) by mouth 3 (three) times daily as needed for muscle spasms. Patient taking differently: Take 10 mg by mouth 3 (three) times daily. 10/27/23  Yes Pickenpack-Cousar, Athena N, NP  diltiazem  (CARDIZEM  CD) 120 MG 24 hr capsule TAKE 1 CAPSULE BY MOUTH  EVERY DAY 04/22/23  Yes Gollan, Timothy J, MD  diltiazem  (CARDIZEM ) 30 MG tablet TAKE 1 TABLET(30 MG) BY MOUTH THREE TIMES DAILY AS NEEDED Patient taking differently: Take 30 mg by mouth 2 (two) times daily. 06/20/23  Yes Gollan, Timothy J, MD  DULoxetine  (CYMBALTA ) 60 MG capsule Take 60 mg by mouth 2 (two) times daily.    Yes [provider]  Fluticasone -Salmeterol (ADVAIR) 250-50 MCG/DOSE AEPB Inhale 1 puff into the lungs 2 (two) times daily.   Yes [provider]  furosemide  (LASIX ) 40 MG tablet TAKE 1 TABLET BY MOUTH EVERY DAY. MAY TAKE ADDITIONAL TABLET IN THE AM FOR WEIGHT GAIN AS NEEDED Patient taking differently: Take 40 mg by mouth daily. 03/10/23  Yes Gollan, Timothy J, MD  JARDIANCE 25 MG TABS tablet Take 25 mg by mouth daily. 08/18/21  Yes [provider]  magic mouthwash (nystatin , lidocaine , diphenhydrAMINE, alum & mag hydroxide) suspension Take 5 mLs by mouth 4 (four) times daily as needed for mouth pain. 04/14/23  Yes Sonja [place]ale, MD  metFORMIN  (GLUCOPHAGE -XR) 500 MG 24 hr tablet Take 1,000 mg by mouth 2 (two) times daily. 10/21/22  Yes [provider]  ondansetron  (ZOFRAN ) 8 MG tablet Take 1 tablet (8 mg total) by mouth every 8 (eight) hours as needed for nausea, vomiting or refractory nausea / vomiting. Start on the third day after chemotherapy. Patient taking differently: Take 8 mg by mouth daily as needed for nausea, vomiting or refractory nausea / vomiting. 02/17/23  Yes Sonja [place]il[place]o, MD  Oxycodone  HCl 10 MG TABS Take 1 tablet (10 mg total) by mouth 3 (three) times daily as needed. Patient taking differently: Take 10 mg by mouth in the morning, at noon, and at bedtime. 10/27/23  Yes Pickenpack-Cousar, Giles Labrum, NP  OXYGEN Inhale 2 L into the lungs at bedtime. Or as needed throughout the day if short of breath   Yes [provider]  pantoprazole  (PROTONIX ) 40 MG tablet Take 1 tablet (40 mg total) by mouth daily. 05/04/18  Yes Dannial Duty, MD   potassium chloride  SA (KLOR-CON ) 20 MEQ tablet Take 20 mEq by mouth 2 (two) times daily. 02/15/20  Yes [provider]  pregabalin  (LYRICA ) 150 MG capsule Take 1 capsule (150 mg total) by mouth 3 (three) times daily. 09/21/23  Yes Pickenpack-Cousar, Giles Labrum, NP  prochlorperazine  (COMPAZINE ) 10 MG tablet Take 1 tablet (10 mg total) by mouth every 6 (six) hours as needed for nausea or vomiting. Patient taking differently: Take 10 mg by mouth daily as needed for nausea or vomiting. 02/17/23  Yes Sonja Crisman, MD  spironolactone  (ALDACTONE ) 25 MG tablet TAKE 1 TABLET(25 MG) BY MOUTH EVERY MORNING 05/23/23  Yes Gollan, Timothy J, MD  zolpidem (AMBIEN) 10 MG tablet Take 5-10 mg by mouth at bedtime. 12/01/22  Yes [provider]  amoxicillin -clavulanate (AUGMENTIN ) 875-125 MG tablet Take 1 tablet by mouth 2 (two) times daily. Patient not taking: Reported on 11/24/2023 09/08/23   Sonja , MD  capecitabine  (XELODA ) 500 MG tablet Take 3 tablets (1,500 mg total) by mouth 2 (two) times daily  after a meal. Take on days of radiation only, Monday through Friday Patient not taking: Reported on 11/24/2023 07/04/23   Sonja Lebanon, MD  fluconazole  (DIFLUCAN ) 100 MG tablet Take 1 tablet (100 mg total) by mouth daily. For antibiotics induced vaginitis Patient not taking: Reported on 11/24/2023 09/08/23   Sonja Montague, MD  lidocaine -prilocaine  (EMLA ) cream Apply to affected area once Patient not taking: Reported on 11/24/2023 02/17/23   Sonja Lagrange, MD  sucralfate  (CARAFATE ) 1 G tablet Take 1 tablet (1 g total) by mouth 4 (four) times daily -  with meals and at bedtime. Patient not taking: Reported on 11/24/2023 06/23/15   Renold Cashing, MD     Family History  Problem Relation Age of Onset   Breast cancer Mother        early 86's   Lung cancer Father    Brain cancer Father    Bone cancer Father    Colon cancer Maternal Aunt    Cancer Maternal Uncle        liver?   Diabetes Maternal Grandmother    Asthma Other      Social History   Socioeconomic History   Marital status: Widowed    Spouse name: Not on file   Number of children: Not on file   Years of education: Not on file   Highest education level: Not on file  Occupational History   Occupation: unemployed  Tobacco Use   Smoking status: Every Day    Current packs/day: 3.00    Average packs/day: 3.0 packs/day for 46.0 years (138.0 ttl pk-yrs)    Types: Cigarettes   Smokeless tobacco: Never   Tobacco comments:    0.5PPD 01/13/2023    Started Chantix  last week  Vaping Use   Vaping status: Never Used  Substance and Sexual Activity   Alcohol use: Yes    Comment: 6 liquor drinks/day   Drug use: No   Sexual activity: Yes    Birth control/protection: Post-menopausal  Other Topics Concern   Not on file  Social History Narrative   Not on file   Social Drivers of Health   Financial Resource Strain: High Risk (07/18/2017)   Overall Financial Resource Strain (CARDIA)    Difficulty of Paying Living Expenses: Very hard  Food Insecurity: No Food Insecurity (11/23/2023)   Hunger Vital Sign    Worried About Running Out of Food in the Last Year: Never true    Ran Out of Food in the Last Year: Never true  Transportation Needs: No Transportation Needs (11/23/2023)   PRAPARE - Administrator, Civil Service (Medical): No    Lack of Transportation (Non-Medical): No  Physical Activity: Insufficiently Active (07/18/2017)   Exercise Vital Sign    Days of Exercise per Week: 5 days    Minutes of Exercise per Session: 20 min  Stress: Stress Concern Present (07/18/2017)   [employer] of Occupational Health - Occupational Stress Questionnaire    Feeling of Stress : Very much  Social Connections: Socially Isolated (07/18/2017)   Social Connection and Isolation Panel [NHANES]    Frequency of Communication with Friends and Family: Twice a week    Frequency of Social Gatherings with Friends and Family: Never    Attends Religious  Services: Never    Database administrator or Organizations: No    Attends Banker Meetings: Never    Marital Status: Divorced       Review of Systems see above: Denies fever, headache, chest pain,  worsening dyspnea, nausea, vomiting or bleeding.  She does have occasional cough, abdominal/back pain  Vital Signs: BP 113/66 (BP Location: Right Arm)   Pulse 84   Temp (!) 97.5 F (36.4 C) (Oral)   Resp 15   Ht 5\' 8"  (1.727 m)   Wt 169 lb 15.6 oz (77.1 kg)   LMP 03/13/2015 (Approximate) Comment: more than 2 years ago  SpO2 93%   BMI 25.84 kg/m   Advance Care Plan: No documents on file    Physical Exam awake, alert.  Chest clear to auscultation bilaterally, intact right chest wall Port-A-Cath, heart with regular rate and rhythm.  Abdomen soft, positive bowel sounds, mod tender right upper quadrant/ epigastric regions to palpation, no lower extremity edema  Imaging: US  Abdomen Limited RUQ (LIVER/GB) Result Date: 11/23/2023 CLINICAL DATA:  Right upper quadrant pain EXAM: ULTRASOUND ABDOMEN LIMITED RIGHT UPPER QUADRANT COMPARISON:  CT abdomen and pelvis 11/22/2023. Abdominal ultrasound 08/12/2018. FINDINGS: Gallbladder: Gallbladder wall thickened measuring 4 mm. There is a small amount of pericholecystic fluid. Gallbladder calculi are seen measuring up to 6 mm. Sonographic Abigail Abler sign is negative. Common bile duct: Diameter: 3 mm Liver: No focal lesion identified. Increased in parenchymal echogenicity. Portal vein is patent on color Doppler imaging with normal direction of blood flow towards the liver. Other: None. IMPRESSION: 1. Cholelithiasis with gallbladder wall thickening and pericholecystic fluid. Findings are concerning for acute cholecystitis. 2. Hepatic steatosis. Electronically Signed   By: Tyron Gallon M.D.   On: 11/23/2023 18:12   CT ABDOMEN PELVIS W CONTRAST Result Date: 11/22/2023 CLINICAL DATA:  Acute nonlocalized abdominal pain. History of lung and colon cancer.  History of pancreatitis. EXAM: CT ABDOMEN AND PELVIS WITH CONTRAST TECHNIQUE: Multidetector CT imaging of the abdomen and pelvis was performed using the standard protocol following bolus administration of intravenous contrast. RADIATION DOSE REDUCTION: This exam was performed according to the departmental dose-optimization program which includes automated exposure control, adjustment of the mA and/or kV according to patient size and/or use of iterative reconstruction technique. CONTRAST:  OMNIPAQUE  IOHEXOL  300 MG/ML  SOLN COMPARISON:  CT chest abdomen pelvis 11/01/2023 FINDINGS: Lower chest: No acute abnormality. Hepatobiliary: Hepatic steatosis. No focal hepatic lesion. Cholelithiasis without evidence of acute cholecystitis. No biliary dilation Pancreas: Unremarkable. No pancreatic ductal dilatation or surrounding inflammatory changes. Spleen: Unremarkable. Adrenals/Urinary Tract: Nodular thickening in both adrenal glands is stable dating back to 01/03/2023. No urinary calculi or hydronephrosis. Unremarkable bladder. Stomach/Bowel: Asymmetric wall thickening about the rectum greater on the left (circa series 2/image 69) is grossly similar to 11/01/2023. There is adjacent fat stranding and trace fluid. Otherwise no thickening of the wall of the small or large bowel. Stomach is within normal limits. The appendix is not visualized. No bowel obstruction. Vascular/Lymphatic: Aortic atherosclerosis. No enlarged abdominal or pelvic lymph nodes. Reproductive: Uterus and bilateral adnexa are unremarkable. Other: No free intraperitoneal air.  No abscess. Musculoskeletal: No acute fracture or destructive osseous lesion. IMPRESSION: 1. Stable asymmetric wall thickening about the rectum greater on the left with adjacent spiculation and edema. 2. Hepatic steatosis. 3. Cholelithiasis without evidence of acute cholecystitis. 4. Aortic Atherosclerosis (ICD10-I70.0). Electronically Signed   By: Rozell Cornet M.D.   On:  11/22/2023 23:45   CT CHEST ABDOMEN PELVIS W CONTRAST Result Date: 11/10/2023 CLINICAL DATA:  Assess treatment response. Rectal cancer. Undergoing radiation. * Tracking Code: BO *. EXAM: CT CHEST, ABDOMEN, AND PELVIS WITH CONTRAST TECHNIQUE: Multidetector CT imaging of the chest, abdomen and pelvis was performed following the standard  protocol during bolus administration of intravenous contrast. RADIATION DOSE REDUCTION: This exam was performed according to the departmental dose-optimization program which includes automated exposure control, adjustment of the mA and/or kV according to patient size and/or use of iterative reconstruction technique. CONTRAST:  OMNIPAQUE  IOHEXOL  300 MG/ML  SOLN COMPARISON:  Abdomen pelvis CT 05/28/2023. Chest abdomen pelvis CT 05/13/2023. Older exams. FINDINGS: CT CHEST FINDINGS Cardiovascular: Right IJ chest port in place with tip of the catheter extends to the central SVC above the right atrium. The port is accessed. The thoracic aorta is normal course and caliber with slight calcified plaque. Heart is nonenlarged. No significant pericardial effusion. The previous thrombus in the SVC is decreased today. Trace residual suggested anteriorly. This could be more chronic thrombus. Please see sagittal series 7, image 132. This is along the tip of the catheter. Mediastinum/Nodes: Preserved thyroid gland. Slightly patulous thoracic esophagus. No specific abnormal lymph node enlargement identified in the axillary regions or hila. There are some small mediastinal nodes identified. These are slightly increased from previous chest CT. Example right paratracheal has short axis of 8 mm on series 2, image 25 today and previously 5 mm. AP window node today has short axis dimension of 6 mm today and previously 3 mm. Left paratracheal node as well. Attention on short follow-up. There is some small pre cardiophrenic nodes which are stable. Stable cystic areas along the course of the thoracic  duct at the level of the diaphragm. Lungs/Pleura: No consolidation, pneumothorax or effusion. Minimal areas of scarring atelectatic changes as well as some minimal areas of ground-glass in the right upper lobe. The ground-glass is new and nonspecific. Few tiny lung nodules are also identified. Example includes a middle lobe focus on series 5, image 91 measuring 4 mm. Unchanged from previous when adjusted for technique. Lateral juxtapleural left lower lobe nodule which previously measured 3 mm is stable today on image 113 of series 5. Other small lesions are also stable. There is 1 new small nodule in the medial left upper lobe measuring 4 mm on series 5, image 65. Musculoskeletal: Old right-sided rib fracture. Mild degenerative changes along the spine. CT ABDOMEN PELVIS FINDINGS Hepatobiliary: Mild fatty liver infiltration. Some sparing along the course of the gallbladder. Patent portal vein. No space-occupying liver lesion. The gallbladder is dilated with some dependent stones. Pancreas: Unremarkable. No pancreatic ductal dilatation or surrounding inflammatory changes. The previous fluid and stranding or along the pancreas and duodenum has resolved. Spleen: Normal in size without focal abnormality. Small splenule anterior superior. Adrenals/Urinary Tract: The areas of nodular thickening along the adrenal glands are again noted and not significant changed from previous. Slightly malrotated right kidney, congenital. No enhancing renal mass or collecting system dilatation. The ureters have a normal course and caliber extending down to the urinary bladder. Preserved contour to the urinary bladder. Stomach/Bowel: Oral contrast was administered. There is a large amount of diffuse colonic stool. No large bowel dilatation. Normal appendix. The area of asymmetric thickening along the rectum towards the right side with some surface spiculation and perirectal fat stranding is stable. Please correlate for the exact location of  the patient's neoplasm. Moderate debris in the stomach. Small bowel is nondilated. The previous edema involving the second third portion of duodenum has resolved. The previous areas of poor wall enhancement have also improved. Please correlate with clinical history. Vascular/Lymphatic: Normal caliber aorta and IVC. Diffuse vascular calcifications along the aorta and branch vessels. There are several small but prominent retroperitoneal nodes identified,  unchanged from previous examination. These are not pathologic by size criteria. No new pathologic nodes identified at this time. Reproductive: Uterus and bilateral adnexa are unremarkable. Other: Mild anasarca.  No free intra-abdominal air or free fluid. Musculoskeletal: Curvature and degenerative changes along the spine. Degenerative changes of the pelvis. Global muscle fatty atrophy. IMPRESSION: Stable asymmetric wall thickening along the rectum with adjacent spiculation and edema. No developing abnormal lymph node enlargement seen in the abdomen pelvis. No hepatic lesions. There are some small nodes in the mediastinum which are slightly larger compared to the prior chest CT of October 2024 but still small. Recommend attention on follow-up. Right IJ chest port. The previous thrombus along the SVC next of the catheter is decreasing. Some residual. Fatty liver infiltration. Gallstone. Moderate colonic stool. Coronary artery calcifications. Please correlate for other coronary risk factors. Stable small adrenal nodules. Previous edema and fluid near the pancreas and duodenum has resolved. Electronically Signed   By: Adrianna Horde M.D.   On: 11/10/2023 14:42    Labs:  CBC: Recent Labs    11/22/23 2005 11/23/23 1609 11/24/23 0258 11/25/23 0354  WBC 11.4* 17.2* 17.8* 17.5*  HGB 13.5 15.1* 13.1 11.9*  HCT 43.1 45.6 39.8 37.1  PLT 231 270 200 166    COAGS: No results for input(s): "INR", "APTT" in the last 8760 hours.  BMP: Recent Labs    11/22/23 2005  11/23/23 1609 11/24/23 0258 11/25/23 0354  NA 136 126* 132* 137  K 3.8 4.0 3.4* 3.3*  CL 99 90* 97* 105  CO2 21* 22 24 20*  GLUCOSE 158* 195* 126* 128*  BUN 9 11 11 16   CALCIUM  9.4 9.5 8.7* 8.5*  CREATININE 0.65 0.78 0.74 0.73  GFRNONAA >60 >60 >60 >60    LIVER FUNCTION TESTS: Recent Labs    09/08/23 1250 11/22/23 2005 11/23/23 1609 11/25/23 0354  BILITOT 0.5 0.4 1.2 1.2  AST 15 27 25 19   ALT 11 19 20 18   ALKPHOS 59 95 111 87  PROT 6.5 7.7 8.4* 6.6  ALBUMIN 3.8 3.6 4.0 2.8*    TUMOR MARKERS: Recent Labs    02/17/23 1009 07/28/23 1240 08/25/23 1315  CEA 18.82* 12.87* 9.15*    Assessment and Plan: 58 y.o. female smoker with past medical history significant for heart failure, pancreatitis, anxiety, arthritis, asthma, metastatic rectal cancer, CHF, COPD, depression, diabetes, fatty liver, gastric ulcer, hypertension, hyperlipidemia, sleep apnea who was admitted to [facility] on 5/7 with persistent right upper quadrant/epigastric pain. Previous CT scan on 5/6 had revealed cholelithiasis without evidence of acute cholecystitis.  Due to persistent abdominal pain patient underwent ultrasound abdomen on 5/7 which revealed gallstones with gallbladder wall thickening and pericholecystic fluid concerning for acute cholecystitis.  Patient was evaluated by surgery and deemed a poor candidate for cholecystectomy.  Request now received for percutaneous cholecystostomy.  She is currently afebrile, WBC 17.5, hemoglobin 11.9, platelets normal, creatinine normal, total bilirubin normal, PT/INR pending.  She is on IV Maxipime /Flagyl .  Imaging studies have been reviewed by Dr. Julietta Ogren. Risks and benefits discussed with the patient including bleeding, infection, damage to adjacent structures, bowel perforation/fistula connection, and sepsis.  All of the patient's questions were answered, patient is agreeable to proceed. Consent signed and in chart.  Procedure scheduled for later  today.  Patient known to IR team from Port-A-Cath placement in 2024.  Thank you for allowing our service to participate in Erica [name] 's care.  Electronically Signed: D. Honore Lux, PA-C  11/25/2023, 11:25 AM      I spent a total of    40 Minutes in face to face in clinical consultation, greater than 50% of which was counseling/coordinating care for image guided percutaneous cholecystostomy

## 2023-11-26 ENCOUNTER — Inpatient Hospital Stay (HOSPITAL_COMMUNITY)

## 2023-11-26 DIAGNOSIS — E119 Type 2 diabetes mellitus without complications: Secondary | ICD-10-CM | POA: Diagnosis not present

## 2023-11-26 DIAGNOSIS — K810 Acute cholecystitis: Secondary | ICD-10-CM | POA: Diagnosis not present

## 2023-11-26 DIAGNOSIS — G894 Chronic pain syndrome: Secondary | ICD-10-CM | POA: Diagnosis not present

## 2023-11-26 DIAGNOSIS — E871 Hypo-osmolality and hyponatremia: Secondary | ICD-10-CM | POA: Diagnosis not present

## 2023-11-26 DIAGNOSIS — I5031 Acute diastolic (congestive) heart failure: Secondary | ICD-10-CM | POA: Diagnosis not present

## 2023-11-26 DIAGNOSIS — I5032 Chronic diastolic (congestive) heart failure: Secondary | ICD-10-CM | POA: Diagnosis not present

## 2023-11-26 DIAGNOSIS — E1165 Type 2 diabetes mellitus with hyperglycemia: Secondary | ICD-10-CM

## 2023-11-26 LAB — CBC
HCT: 37.1 % (ref 36.0–46.0)
Hemoglobin: 11.7 g/dL — ABNORMAL LOW (ref 12.0–15.0)
MCH: 31.3 pg (ref 26.0–34.0)
MCHC: 31.5 g/dL (ref 30.0–36.0)
MCV: 99.2 fL (ref 80.0–100.0)
Platelets: 229 10*3/uL (ref 150–400)
RBC: 3.74 MIL/uL — ABNORMAL LOW (ref 3.87–5.11)
RDW: 17.1 % — ABNORMAL HIGH (ref 11.5–15.5)
WBC: 16.6 10*3/uL — ABNORMAL HIGH (ref 4.0–10.5)
nRBC: 0 % (ref 0.0–0.2)

## 2023-11-26 LAB — BASIC METABOLIC PANEL WITH GFR
Anion gap: 10 (ref 5–15)
BUN: 25 mg/dL — ABNORMAL HIGH (ref 6–20)
CO2: 22 mmol/L (ref 22–32)
Calcium: 9 mg/dL (ref 8.9–10.3)
Chloride: 103 mmol/L (ref 98–111)
Creatinine, Ser: 0.84 mg/dL (ref 0.44–1.00)
GFR, Estimated: >60 mL/min (ref >60)
Glucose, Bld: 139 mg/dL — ABNORMAL HIGH (ref 70–99)
Potassium: 3.5 mmol/L (ref 3.5–5.1)
Sodium: 135 mmol/L (ref 135–145)

## 2023-11-26 LAB — ECHOCARDIOGRAM COMPLETE
AR max vel: 1.95 cm2
AV Area VTI: 1.99 cm2
AV Area mean vel: 1.74 cm2
AV Mean grad: 4 mmHg
AV Peak grad: 6.4 mmHg
Ao pk vel: 1.26 m/s
Area-P 1/2: 2.49 cm2
Calc EF: 60.7 %
Height: 68 in
MV VTI: 1.9 cm2
S' Lateral: 3 cm
Single Plane A2C EF: 57.5 %
Single Plane A4C EF: 61.3 %
Weight: 2719.59 [oz_av]

## 2023-11-26 LAB — GLUCOSE, CAPILLARY
Glucose-Capillary: 159 mg/dL — ABNORMAL HIGH (ref 70–99)
Glucose-Capillary: 186 mg/dL — ABNORMAL HIGH (ref 70–99)
Glucose-Capillary: 156 mg/dL — ABNORMAL HIGH (ref 70–99)
Glucose-Capillary: 174 mg/dL — ABNORMAL HIGH (ref 70–99)

## 2023-11-26 MED ORDER — SENNOSIDES-DOCUSATE SODIUM 8.6-50 MG PO TABS
1.0000 | ORAL_TABLET | Freq: Two times a day (BID) | ORAL | Status: DC
  Administered 2023-11-26 – 2023-11-27 (×3): 1 via ORAL
  Filled 2023-11-26 (×3): qty 1

## 2023-11-26 MED ORDER — LACTULOSE 10 GM/15ML PO SOLN
20.0000 g | Freq: Once | ORAL | Status: DC
  Filled 2023-11-26: qty 30

## 2023-11-26 MED ORDER — POLYETHYLENE GLYCOL 3350 17 G PO PACK
17.0000 g | PACK | Freq: Every day | ORAL | Status: DC
  Administered 2023-11-26 – 2023-11-27 (×2): 17 g via ORAL
  Filled 2023-11-26 (×2): qty 1

## 2023-11-26 NOTE — Plan of Care (Signed)
  Problem: Education: Goal: Ability to describe self-care measures that may prevent or decrease complications (Diabetes Survival Skills Education) will improve 11/26/2023 1111 by Kerwin Peels, RN Outcome: Adequate for Discharge 11/26/2023 1111 by Kerwin Peels, RN Outcome: Progressing Goal: Individualized Educational Video(s) 11/26/2023 1111 by Kerwin Peels, RN Outcome: Adequate for Discharge 11/26/2023 1111 by Kerwin Peels, RN Outcome: Progressing   Problem: Coping: Goal: Ability to adjust to condition or change in health will improve 11/26/2023 1111 by Kerwin Peels, RN Outcome: Adequate for Discharge 11/26/2023 1111 by Kerwin Peels, RN Outcome: Progressing   Problem: Fluid Volume: Goal: Ability to maintain a balanced intake and output will improve 11/26/2023 1111 by Kerwin Peels, RN Outcome: Adequate for Discharge 11/26/2023 1111 by Kerwin Peels, RN Outcome: Progressing   Problem: Health Behavior/Discharge Planning: Goal: Ability to identify and utilize available resources and services will improve 11/26/2023 1111 by Kerwin Peels, RN Outcome: Adequate for Discharge 11/26/2023 1111 by Kerwin Peels, RN Outcome: Progressing Goal: Ability to manage health-related needs will improve Outcome: Adequate for Discharge   Problem: Metabolic: Goal: Ability to maintain appropriate glucose levels will improve Outcome: Adequate for Discharge   Problem: Nutritional: Goal: Maintenance of adequate nutrition will improve Outcome: Adequate for Discharge Goal: Progress toward achieving an optimal weight will improve Outcome: Adequate for Discharge   Problem: Skin Integrity: Goal: Risk for impaired skin integrity will decrease Outcome: Adequate for Discharge   Problem: Tissue Perfusion: Goal: Adequacy of tissue perfusion will improve Outcome: Adequate for Discharge   Problem: Education: Goal: Knowledge of  General Education information will improve Description: Including pain rating scale, medication(s)/side effects and non-pharmacologic comfort measures Outcome: Adequate for Discharge   Problem: Health Behavior/Discharge Planning: Goal: Ability to manage health-related needs will improve Outcome: Adequate for Discharge   Problem: Clinical Measurements: Goal: Ability to maintain clinical measurements within normal limits will improve Outcome: Adequate for Discharge Goal: Will remain free from infection Outcome: Adequate for Discharge Goal: Diagnostic test results will improve Outcome: Adequate for Discharge Goal: Respiratory complications will improve Outcome: Adequate for Discharge Goal: Cardiovascular complication will be avoided Outcome: Adequate for Discharge   Problem: Activity: Goal: Risk for activity intolerance will decrease Outcome: Adequate for Discharge   Problem: Nutrition: Goal: Adequate nutrition will be maintained Outcome: Adequate for Discharge   Problem: Coping: Goal: Level of anxiety will decrease Outcome: Adequate for Discharge   Problem: Elimination: Goal: Will not experience complications related to bowel motility Outcome: Adequate for Discharge Goal: Will not experience complications related to urinary retention Outcome: Adequate for Discharge   Problem: Pain Managment: Goal: General experience of comfort will improve and/or be controlled Outcome: Adequate for Discharge   Problem: Safety: Goal: Ability to remain free from injury will improve Outcome: Adequate for Discharge   Problem: Skin Integrity: Goal: Risk for impaired skin integrity will decrease Outcome: Adequate for Discharge

## 2023-11-26 NOTE — Progress Notes (Signed)
 Subjective/Chief Complaint: No complaints. Feels better   Objective: Vital signs in last 24 hours: Temp:  [97.5 F (36.4 C)-98.4 F (36.9 C)] 97.5 F (36.4 C) (05/10 0944) Pulse Rate:  [72-96] 72 (05/10 0944) Resp:  [15-25] 18 (05/10 0944) BP: (105-138)/(52-77) 107/64 (05/10 0944) SpO2:  [90 %-100 %] 99 % (05/10 0944) Last BM Date : 11/24/23  Intake/Output from previous day: 05/09 0701 - 05/10 0700 In: 1137.2 [P.O.:735; IV Piggyback:402.2] Out: 1760 [Urine:1500] Intake/Output this shift: Total I/O In: 480 [P.O.:480] Out: 535 [Urine:450; Other:85]  General appearance: alert and cooperative Resp: clear to auscultation bilaterally Cardio: regular rate and rhythm GI: soft, mild RUQ tenderness. Drain output bilious  Lab Results:  Recent Labs    11/25/23 0354 11/26/23 0046  WBC 17.5* 16.6*  HGB 11.9* 11.7*  HCT 37.1 37.1  PLT 166 229   BMET Recent Labs    11/25/23 0354 11/26/23 0046  NA 137 135  K 3.3* 3.5  CL 105 103  CO2 20* 22  GLUCOSE 128* 139*  BUN 16 25*  CREATININE 0.73 0.84  CALCIUM  8.5* 9.0   PT/INR Recent Labs    11/25/23 1136  LABPROT 14.7  INR 1.1   ABG No results for input(s): "PHART", "HCO3" in the last 72 hours.  Invalid input(s): "PCO2", "PO2"  Studies/Results: ECHOCARDIOGRAM COMPLETE Result Date: 11/26/2023    ECHOCARDIOGRAM REPORT   Patient Name:   Erica [name] Date of Exam: 11/26/2023 Medical Rec #:  [id number]      Height:       68.0 in Accession #:    [id number]     Weight:       170.0 lb Date of Birth:  1966/02/25      BSA:          1.907 m Patient Age:    58 years       BP:           138/77 mmHg Patient Gender: F              HR:           82 bpm. Exam Location:  Inpatient Procedure: 2D Echo, Cardiac Doppler and Color Doppler (Both Spectral and Color            Flow Doppler were utilized during procedure). Indications:    CHF-acute diastolic  History:        Patient has prior history of Echocardiogram examinations, most                  recent 09/11/2018. COPD, Arrythmias:Tachycardia,                 Signs/Symptoms:Shortness of Breath, Chest Pain and Dyspnea; Risk                 Factors:Hypertension, Diabetes, Dyslipidemia and Sleep Apnea.  Sonographer:    Juanita Shaw Referring Phys: [id number] CALLIE E GOODRICH IMPRESSIONS  1. Technically difficult study. Left ventricular ejection fraction, by estimation, is 60 to 65%. The left ventricle has normal function. The left ventricle has no regional wall motion abnormalities. Left ventricular diastolic parameters are indeterminate.  2. Right ventricular systolic function is normal. The right ventricular size is normal. Tricuspid regurgitation signal is inadequate for assessing PA pressure.  3. The mitral valve is normal in structure. Trivial mitral valve regurgitation. No evidence of mitral stenosis.  4. The aortic valve was not well visualized. Aortic valve regurgitation is not visualized. No aortic stenosis is present. FINDINGS  Left Ventricle: Left ventricular ejection fraction, by estimation, is 60 to 65%. The left ventricle has normal function. The left ventricle has no regional wall motion abnormalities. The left ventricular internal cavity size was normal in size. There is  no left ventricular hypertrophy. Left ventricular diastolic parameters are indeterminate. Right Ventricle: The right ventricular size is normal. Right vetricular wall thickness was not well visualized. Right ventricular systolic function is normal. Tricuspid regurgitation signal is inadequate for assessing PA pressure. Left Atrium: Left atrial size was normal in size. Right Atrium: Right atrial size was normal in size. Pericardium: Trivial pericardial effusion is present. Mitral Valve: The mitral valve is normal in structure. Trivial mitral valve regurgitation. No evidence of mitral valve stenosis. MV peak gradient, 4.4 mmHg. The mean mitral valve gradient is 2.0 mmHg. Tricuspid Valve: The tricuspid valve is normal in  structure. Tricuspid valve regurgitation is trivial. Aortic Valve: The aortic valve was not well visualized. Aortic valve regurgitation is not visualized. No aortic stenosis is present. Aortic valve mean gradient measures 4.0 mmHg. Aortic valve peak gradient measures 6.4 mmHg. Aortic valve area, by VTI measures 1.99 cm. Pulmonic Valve: The pulmonic valve was not well visualized. Pulmonic valve regurgitation is not visualized. Aorta: The aortic root and ascending aorta are structurally normal, with no evidence of dilitation. IAS/Shunts: The interatrial septum was not well visualized.  LEFT VENTRICLE PLAX 2D LVIDd:         4.50 cm      Diastology LVIDs:         3.00 cm      LV e' medial:    6.64 cm/s LV PW:         0.70 cm      LV E/e' medial:  11.2 LV IVS:        0.70 cm      LV e' lateral:   7.83 cm/s LVOT diam:     1.90 cm      LV E/e' lateral: 9.5 LV SV:         52 LV SV Index:   27 LVOT Area:     2.84 cm  LV Volumes (MOD) LV vol d, MOD A2C: 104.0 ml LV vol d, MOD A4C: 137.0 ml LV vol s, MOD A2C: 44.2 ml LV vol s, MOD A4C: 53.0 ml LV SV MOD A2C:     59.8 ml LV SV MOD A4C:     137.0 ml LV SV MOD BP:      75.1 ml RIGHT VENTRICLE             IVC RV Basal diam:  2.90 cm     IVC diam: 2.10 cm RV Mid diam:    2.00 cm RV S prime:     12.20 cm/s TAPSE (M-mode): 2.1 cm LEFT ATRIUM             Index        RIGHT ATRIUM           Index LA diam:        3.50 cm 1.84 cm/m   RA Area:     13.50 cm LA Vol (A2C):   37.4 ml 19.61 ml/m  RA Volume:   32.60 ml  17.09 ml/m LA Vol (A4C):   25.7 ml 13.48 ml/m LA Biplane Vol: 31.1 ml 16.31 ml/m  AORTIC VALVE                    PULMONIC VALVE AV Area (Vmax):  1.95 cm     PV Vmax:       0.90 m/s AV Area (Vmean):   1.74 cm     PV Peak grad:  3.3 mmHg AV Area (VTI):     1.99 cm AV Vmax:           126.00 cm/s AV Vmean:          88.400 cm/s AV VTI:            0.259 m AV Peak Grad:      6.4 mmHg AV Mean Grad:      4.0 mmHg LVOT Vmax:         86.80 cm/s LVOT Vmean:        54.300 cm/s  LVOT VTI:          0.182 m LVOT/AV VTI ratio: 0.70  AORTA Ao Root diam: 3.30 cm Ao Asc diam:  2.70 cm MITRAL VALVE MV Area (PHT): 2.49 cm    SHUNTS MV Area VTI:   1.90 cm    Systemic VTI:  0.18 m MV Peak grad:  4.4 mmHg    Systemic Diam: 1.90 cm MV Mean grad:  2.0 mmHg MV Vmax:       1.05 m/s MV Vmean:      74.0 cm/s MV Decel Time: 305 msec MV E velocity: 74.30 cm/s MV A velocity: 87.50 cm/s MV E/A ratio:  0.85 Carson Clara MD Electronically signed by Carson Clara MD Signature Date/Time: 11/26/2023/9:52:19 AM    Final     Anti-infectives: Anti-infectives (From admission, onward)    Start     Dose/Rate Route Frequency Ordered Stop   11/24/23 0800  metroNIDAZOLE  (FLAGYL ) IVPB 500 mg        500 mg 100 mL/hr over 60 Minutes Intravenous Every 12 hours 11/23/23 2208     11/24/23 0400  ceFEPIme  (MAXIPIME ) 2 g in sodium chloride  0.9 % 100 mL IVPB        2 g 200 mL/hr over 30 Minutes Intravenous Every 8 hours 11/23/23 2208     11/23/23 1900  ceFEPIme  (MAXIPIME ) 2 g in sodium chloride  0.9 % 100 mL IVPB       Placed in "And" Linked Group   2 g 200 mL/hr over 30 Minutes Intravenous  Once 11/23/23 1858 11/24/23 1936   11/23/23 1900  metroNIDAZOLE  (FLAGYL ) IVPB 500 mg       Placed in "And" Linked Group   500 mg 100 mL/hr over 60 Minutes Intravenous  Once 11/23/23 1858 11/24/23 1937       Assessment/Plan: s/p * No surgery found * Calculous cholecystitis 58 yo F with oligometastatic rectal cancer s/p chemo on xeloda  and radiation and complex medical history as outlined in EMR included CHF, COPD, chronic smoker, fatty liver disease who presents with cholelithiasis with cholecystitis. Advance diet Wbc improving. Continue IV abx until wbc normalizes Continue drain   LOS: 2 days    Lillette Reid III 11/26/2023

## 2023-11-26 NOTE — Progress Notes (Signed)
 Rounding Note    Patient Name: Erica [name] Date of Encounter: 11/26/2023  [facility] Cardiologist: Belva Boyden, MD   Subjective   Pt feeling better  NO CP  Breathing is good   Inpatient Medications    Scheduled Meds:  arformoterol   15 mcg Nebulization BID   budesonide  (PULMICORT ) nebulizer solution  0.5 mg Nebulization BID   busPIRone   30 mg Oral BID   Chlorhexidine  Gluconate Cloth  6 each Topical Daily   diltiazem   120 mg Oral Daily   DULoxetine   60 mg Oral BID   enoxaparin  (LOVENOX ) injection  40 mg Subcutaneous Q24H   insulin  aspart  0-5 Units Subcutaneous QHS   insulin  aspart  0-9 Units Subcutaneous TID WC   lactulose  20 g Oral Once   methylPREDNISolone  (SOLU-MEDROL ) injection  20 mg Intravenous Daily   pantoprazole   40 mg Oral Daily   polyethylene glycol  17 g Oral Daily   pregabalin   150 mg Oral TID   revefenacin   175 mcg Nebulization Daily   senna-docusate  1 tablet Oral BID   sodium chloride  flush  5 mL Intracatheter Q8H   Continuous Infusions:  ceFEPime  (MAXIPIME ) IV 2 g (11/26/23 1243)   metronidazole  500 mg (11/26/23 0928)   PRN Meds: acetaminophen  **OR** acetaminophen , albuterol , bisacodyl , clonazePAM , hydrALAZINE , HYDROmorphone  (DILAUDID ) injection, iohexol , labetalol , oxyCODONE , sodium chloride  flush   Vital Signs    Vitals:   11/26/23 0142 11/26/23 0607 11/26/23 0808 11/26/23 0944  BP: 125/77 138/77  107/64  Pulse: 77 73  72  Resp: 16 16  18   Temp: 98 F (36.7 C) (!) 97.5 F (36.4 C)  (!) 97.5 F (36.4 C)  TempSrc: Oral Oral    SpO2: 94% 100% 94% 99%  Weight:      Height:        Intake/Output Summary (Last 24 hours) at 11/26/2023 1316 Last data filed at 11/26/2023 1244 Gross per 24 hour  Intake 1857.15 ml  Output 1725 ml  Net 132.15 ml      11/23/2023    9:55 PM 11/17/2023   10:35 AM 10/27/2023    1:26 PM  Last 3 Weights  Weight (lbs) 169 lb 15.6 oz 170 lb 12.8 oz 157 lb 11.2 oz  Weight (kg) 77.1 kg 77.474 kg 71.532  kg      Telemetry    SR - Personally Reviewed  ECG    NO new - Personally Reviewed  Physical Exam   GEN: No acute distress.   Neck: No JVD Cardiac: RRR, no murmurs,   Respiratory: Clear to auscultation  Ext  No  edema;  Labs    High Sensitivity Troponin:   Recent Labs  Lab 11/23/23 1609 11/23/23 2124  TROPONINIHS 6 5     Chemistry Recent Labs  Lab 11/22/23 2005 11/23/23 1609 11/24/23 0258 11/25/23 0354 11/26/23 0046  NA 136 126* 132* 137 135  K 3.8 4.0 3.4* 3.3* 3.5  CL 99 90* 97* 105 103  CO2 21* 22 24 20* 22  GLUCOSE 158* 195* 126* 128* 139*  BUN 9 11 11 16  25*  CREATININE 0.65 0.78 0.74 0.73 0.84  CALCIUM  9.4 9.5 8.7* 8.5* 9.0  MG  --   --   --  2.2  --   PROT 7.7 8.4*  --  6.6  --   ALBUMIN 3.6 4.0  --  2.8*  --   AST 27 25  --  19  --   ALT 19 20  --  18  --   ALKPHOS 95 111  --  87  --   BILITOT 0.4 1.2  --  1.2  --   GFRNONAA >60 >60 >60 >60 >60  ANIONGAP 16* 14 11 12 10     Lipids No results for input(s): "CHOL", "TRIG", "HDL", "LABVLDL", "LDLCALC", "CHOLHDL" in the last 168 hours.  Hematology Recent Labs  Lab 11/24/23 0258 11/25/23 0354 11/26/23 0046  WBC 17.8* 17.5* 16.6*  RBC 4.16 3.72* 3.74*  HGB 13.1 11.9* 11.7*  HCT 39.8 37.1 37.1  MCV 95.7 99.7 99.2  MCH 31.5 32.0 31.3  MCHC 32.9 32.1 31.5  RDW 17.2* 17.0* 17.1*  PLT 200 166 229   Thyroid No results for input(s): "TSH", "FREET4" in the last 168 hours.  BNPNo results for input(s): "BNP", "PROBNP" in the last 168 hours.  DDimer No results for input(s): "DDIMER" in the last 168 hours.   Radiology    ECHOCARDIOGRAM COMPLETE Result Date: 11/26/2023    ECHOCARDIOGRAM REPORT   Patient Name:   Erica [name] Date of Exam: 11/26/2023 Medical Rec #:  [id number]      Height:       68.0 in Accession #:    [id number]     Weight:       170.0 lb Date of Birth:  07-11-1966      BSA:          1.907 m Patient Age:    58 years       BP:           138/77 mmHg Patient Gender: F              HR:            82 bpm. Exam Location:  Inpatient Procedure: 2D Echo, Cardiac Doppler and Color Doppler (Both Spectral and Color            Flow Doppler were utilized during procedure). Indications:    CHF-acute diastolic  History:        Patient has prior history of Echocardiogram examinations, most                 recent 09/11/2018. COPD, Arrythmias:Tachycardia,                 Signs/Symptoms:Shortness of Breath, Chest Pain and Dyspnea; Risk                 Factors:Hypertension, Diabetes, Dyslipidemia and Sleep Apnea.  Sonographer:    Juanita Shaw Referring Phys: [id number] CALLIE E GOODRICH IMPRESSIONS  1. Technically difficult study. Left ventricular ejection fraction, by estimation, is 60 to 65%. The left ventricle has normal function. The left ventricle has no regional wall motion abnormalities. Left ventricular diastolic parameters are indeterminate.  2. Right ventricular systolic function is normal. The right ventricular size is normal. Tricuspid regurgitation signal is inadequate for assessing PA pressure.  3. The mitral valve is normal in structure. Trivial mitral valve regurgitation. No evidence of mitral stenosis.  4. The aortic valve was not well visualized. Aortic valve regurgitation is not visualized. No aortic stenosis is present. FINDINGS  Left Ventricle: Left ventricular ejection fraction, by estimation, is 60 to 65%. The left ventricle has normal function. The left ventricle has no regional wall motion abnormalities. The left ventricular internal cavity size was normal in size. There is  no left ventricular hypertrophy. Left ventricular diastolic parameters are indeterminate. Right Ventricle: The right ventricular size is normal. Right vetricular wall thickness was not well visualized.  Right ventricular systolic function is normal. Tricuspid regurgitation signal is inadequate for assessing PA pressure. Left Atrium: Left atrial size was normal in size. Right Atrium: Right atrial size was normal in size.  Pericardium: Trivial pericardial effusion is present. Mitral Valve: The mitral valve is normal in structure. Trivial mitral valve regurgitation. No evidence of mitral valve stenosis. MV peak gradient, 4.4 mmHg. The mean mitral valve gradient is 2.0 mmHg. Tricuspid Valve: The tricuspid valve is normal in structure. Tricuspid valve regurgitation is trivial. Aortic Valve: The aortic valve was not well visualized. Aortic valve regurgitation is not visualized. No aortic stenosis is present. Aortic valve mean gradient measures 4.0 mmHg. Aortic valve peak gradient measures 6.4 mmHg. Aortic valve area, by VTI measures 1.99 cm. Pulmonic Valve: The pulmonic valve was not well visualized. Pulmonic valve regurgitation is not visualized. Aorta: The aortic root and ascending aorta are structurally normal, with no evidence of dilitation. IAS/Shunts: The interatrial septum was not well visualized.  LEFT VENTRICLE PLAX 2D LVIDd:         4.50 cm      Diastology LVIDs:         3.00 cm      LV e' medial:    6.64 cm/s LV PW:         0.70 cm      LV E/e' medial:  11.2 LV IVS:        0.70 cm      LV e' lateral:   7.83 cm/s LVOT diam:     1.90 cm      LV E/e' lateral: 9.5 LV SV:         52 LV SV Index:   27 LVOT Area:     2.84 cm  LV Volumes (MOD) LV vol d, MOD A2C: 104.0 ml LV vol d, MOD A4C: 137.0 ml LV vol s, MOD A2C: 44.2 ml LV vol s, MOD A4C: 53.0 ml LV SV MOD A2C:     59.8 ml LV SV MOD A4C:     137.0 ml LV SV MOD BP:      75.1 ml RIGHT VENTRICLE             IVC RV Basal diam:  2.90 cm     IVC diam: 2.10 cm RV Mid diam:    2.00 cm RV S prime:     12.20 cm/s TAPSE (M-mode): 2.1 cm LEFT ATRIUM             Index        RIGHT ATRIUM           Index LA diam:        3.50 cm 1.84 cm/m   RA Area:     13.50 cm LA Vol (A2C):   37.4 ml 19.61 ml/m  RA Volume:   32.60 ml  17.09 ml/m LA Vol (A4C):   25.7 ml 13.48 ml/m LA Biplane Vol: 31.1 ml 16.31 ml/m  AORTIC VALVE                    PULMONIC VALVE AV Area (Vmax):    1.95 cm     PV Vmax:        0.90 m/s AV Area (Vmean):   1.74 cm     PV Peak grad:  3.3 mmHg AV Area (VTI):     1.99 cm AV Vmax:           126.00 cm/s AV Vmean:  88.400 cm/s AV VTI:            0.259 m AV Peak Grad:      6.4 mmHg AV Mean Grad:      4.0 mmHg LVOT Vmax:         86.80 cm/s LVOT Vmean:        54.300 cm/s LVOT VTI:          0.182 m LVOT/AV VTI ratio: 0.70  AORTA Ao Root diam: 3.30 cm Ao Asc diam:  2.70 cm MITRAL VALVE MV Area (PHT): 2.49 cm    SHUNTS MV Area VTI:   1.90 cm    Systemic VTI:  0.18 m MV Peak grad:  4.4 mmHg    Systemic Diam: 1.90 cm MV Mean grad:  2.0 mmHg MV Vmax:       1.05 m/s MV Vmean:      74.0 cm/s MV Decel Time: 305 msec MV E velocity: 74.30 cm/s MV A velocity: 87.50 cm/s MV E/A ratio:  0.85 Carson Clara MD Electronically signed by Carson Clara MD Signature Date/Time: 11/26/2023/9:52:19 AM    Final     Cardiac Studies   Echo:11/26/23  . Technically difficult study. Left ventricular ejection fraction, by  estimation, is 60 to 65%. The left ventricle has normal function. The left  ventricle has no regional wall motion abnormalities. Left ventricular  diastolic parameters are  indeterminate.   2. Right ventricular systolic function is normal. The right ventricular  size is normal. Tricuspid regurgitation signal is inadequate for assessing  PA pressure.   3. The mitral valve is normal in structure. Trivial mitral valve  regurgitation. No evidence of mitral stenosis.   4. The aortic valve was not well visualized. Aortic valve regurgitation  is not visualized. No aortic stenosis is present.     Patient Profile      Erica [name] is a 58 y.o. 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 Butch Cashing.   Assessment & Plan    PT with drain placed   Hx HFpEF   VOlume status looks good    Echo today is normal LV function  Pt was on spironolactone  25 with KCL as well as Jardiance (as well as metformin ) prior to D/C   Had lasix  prn and dilt prn   SHe is now on dilt CD    (could do spiro or dilt)  Watch KCL  I will make sure she has follow up as an outpt   For questions or updates, please contact [facility] Please consult [email or web address] for contact info under        Signed, Ola Berger, MD  11/26/2023, 1:16 PM

## 2023-11-26 NOTE — Plan of Care (Signed)

## 2023-11-26 NOTE — Progress Notes (Signed)
 Referring Provider(s): Dr. Edmon Gosling, MD  Supervising Physician: Erica Hau  Patient Status:  [facility] - In-pt  Chief Complaint: Acute cholecystitis s/p percutaneous drainage on 5/9.  Subjective:  Patient alert and sitting up in bed, calm.  Patient states she is improving since yesterday, with mild pain at drain site with coughing and moving. Denies any fevers, headache, chest pain, SOB, cough, abdominal pain, nausea, vomiting or bleeding.    Allergies: Tramadol and Penicillins  Medications: Prior to Admission medications   Medication Sig Start Date End Date Taking? Authorizing Provider  albuterol  (VENTOLIN  HFA) 108 (90 Base) MCG/ACT inhaler Inhale 2 puffs into the lungs every 6 (six) hours as needed for wheezing or shortness of breath.    Yes [provider]  busPIRone  (BUSPAR ) 30 MG tablet Take 30 mg by mouth 2 (two) times daily. PER PT STATES HER DOSE WAS INCREASED 02/14/20  Yes [provider]  clonazePAM  (KLONOPIN ) 0.5 MG tablet Take 0.5 mg by mouth 2 (two) times daily. 03/12/20  Yes [provider]  cyclobenzaprine  (FLEXERIL ) 10 MG tablet Take 1 tablet (10 mg total) by mouth 3 (three) times daily as needed for muscle spasms. Patient taking differently: Take 10 mg by mouth 3 (three) times daily. 10/27/23  Yes Pickenpack-Cousar, Athena N, NP  diltiazem  (CARDIZEM  CD) 120 MG 24 hr capsule TAKE 1 CAPSULE BY MOUTH EVERY DAY 04/22/23  Yes Gollan, Timothy J, MD  diltiazem  (CARDIZEM ) 30 MG tablet TAKE 1 TABLET(30 MG) BY MOUTH THREE TIMES DAILY AS NEEDED Patient taking differently: Take 30 mg by mouth 2 (two) times daily. 06/20/23  Yes Gollan, Timothy J, MD  DULoxetine  (CYMBALTA ) 60 MG capsule Take 60 mg by mouth 2 (two) times daily.    Yes [provider]  Fluticasone -Salmeterol (ADVAIR) 250-50 MCG/DOSE AEPB Inhale 1 puff into the lungs 2 (two) times daily.   Yes [provider]  furosemide  (LASIX ) 40 MG tablet TAKE 1 TABLET BY MOUTH  EVERY DAY. MAY TAKE ADDITIONAL TABLET IN THE AM FOR WEIGHT GAIN AS NEEDED Patient taking differently: Take 40 mg by mouth daily. 03/10/23  Yes Gollan, Timothy J, MD  JARDIANCE 25 MG TABS tablet Take 25 mg by mouth daily. 08/18/21  Yes [provider]  magic mouthwash (nystatin , lidocaine , diphenhydrAMINE, alum & mag hydroxide) suspension Take 5 mLs by mouth 4 (four) times daily as needed for mouth pain. 04/14/23  Yes Sonja [place], MD  metFORMIN  (GLUCOPHAGE -XR) 500 MG 24 hr tablet Take 1,000 mg by mouth 2 (two) times daily. 10/21/22  Yes [provider]  ondansetron  (ZOFRAN ) 8 MG tablet Take 1 tablet (8 mg total) by mouth every 8 (eight) hours as needed for nausea, vomiting or refractory nausea / vomiting. Start on the third day after chemotherapy. Patient taking differently: Take 8 mg by mouth daily as needed for nausea, vomiting or refractory nausea / vomiting. 02/17/23  Yes Sonja [place], MD  Oxycodone  HCl 10 MG TABS Take 1 tablet (10 mg total) by mouth 3 (three) times daily as needed. Patient taking differently: Take 10 mg by mouth in the morning, at noon, and at bedtime. 10/27/23  Yes Pickenpack-Cousar, Giles Labrum, NP  OXYGEN Inhale 2 L into the lungs at bedtime. Or as needed throughout the day if short of breath   Yes [provider]  pantoprazole  (PROTONIX ) 40 MG tablet Take 1 tablet (40 mg total) by mouth daily. 05/04/18  Yes Dannial Duty, MD  potassium chloride  SA (KLOR-CON ) 20 MEQ tablet Take 20  mEq by mouth 2 (two) times daily. 02/15/20  Yes [provider]  pregabalin  (LYRICA ) 150 MG capsule Take 1 capsule (150 mg total) by mouth 3 (three) times daily. 09/21/23  Yes Pickenpack-Cousar, Giles Labrum, NP  prochlorperazine  (COMPAZINE ) 10 MG tablet Take 1 tablet (10 mg total) by mouth every 6 (six) hours as needed for nausea or vomiting. Patient taking differently: Take 10 mg by mouth daily as needed for nausea or vomiting. 02/17/23  Yes Sonja [place], MD  spironolactone  (ALDACTONE )  25 MG tablet TAKE 1 TABLET(25 MG) BY MOUTH EVERY MORNING 05/23/23  Yes Gollan, Timothy J, MD  zolpidem (AMBIEN) 10 MG tablet Take 5-10 mg by mouth at bedtime. 12/01/22  Yes [provider]  amoxicillin -clavulanate (AUGMENTIN ) 875-125 MG tablet Take 1 tablet by mouth 2 (two) times daily. Patient not taking: Reported on 11/24/2023 09/08/23   Sonja [place], MD  capecitabine  (XELODA ) 500 MG tablet Take 3 tablets (1,500 mg total) by mouth 2 (two) times daily after a meal. Take on days of radiation only, Monday through Friday Patient not taking: Reported on 11/24/2023 07/04/23   Sonja [place], MD  fluconazole  (DIFLUCAN ) 100 MG tablet Take 1 tablet (100 mg total) by mouth daily. For antibiotics induced vaginitis Patient not taking: Reported on 11/24/2023 09/08/23   Sonja [place], MD  lidocaine -prilocaine  (EMLA ) cream Apply to affected area once Patient not taking: Reported on 11/24/2023 02/17/23   Sonja [place], MD  sucralfate  (CARAFATE ) 1 G tablet Take 1 tablet (1 g total) by mouth 4 (four) times daily -  with meals and at bedtime. Patient not taking: Reported on 11/24/2023 06/23/15   Renold Cashing, MD     Vital Signs: BP 101/89 (BP Location: Right Arm)   Pulse 77   Temp (!) 97.5 F (36.4 C)   Resp 16   Ht 5\' 8"  (1.727 m)   Wt 169 lb 15.6 oz (77.1 kg)   LMP 03/13/2015 (Approximate) Comment: more than 2 years ago  SpO2 100%   BMI 25.84 kg/m   Physical Exam Constitutional:      Appearance: Normal appearance. She is well-developed.  Cardiovascular:     Rate and Rhythm: Normal rate.  Pulmonary:     Effort: Pulmonary effort is normal.  Musculoskeletal:        General: Normal range of motion.     Comments: RUQ/ Right flank drain appropriately dressed. Dressing is clean, dry, intact. Line flushes well.  Drain incision site non-tender, without evidence of infection. Retaining suture in place.  Good bilious output in collection bag.    Skin:    General: Skin is warm and dry.  Neurological:     Mental Status: She  is alert and oriented to person, place, and time.      Labs:  CBC: Recent Labs    11/23/23 1609 11/24/23 0258 11/25/23 0354 11/26/23 0046  WBC 17.2* 17.8* 17.5* 16.6*  HGB 15.1* 13.1 11.9* 11.7*  HCT 45.6 39.8 37.1 37.1  PLT 270 200 166 229    COAGS: Recent Labs    11/25/23 1136  INR 1.1    BMP: Recent Labs    11/23/23 1609 11/24/23 0258 11/25/23 0354 11/26/23 0046  NA 126* 132* 137 135  K 4.0 3.4* 3.3* 3.5  CL 90* 97* 105 103  CO2 22 24 20* 22  GLUCOSE 195* 126* 128* 139*  BUN 11 11 16  25*  CALCIUM  9.5 8.7* 8.5* 9.0  CREATININE 0.78 0.74 0.73 0.84  GFRNONAA >60 >60 >60 >60  LIVER FUNCTION TESTS: Recent Labs    09/08/23 1250 11/22/23 2005 11/23/23 1609 11/25/23 0354  BILITOT 0.5 0.4 1.2 1.2  AST 15 27 25 19   ALT 11 19 20 18   ALKPHOS 59 95 111 87  PROT 6.5 7.7 8.4* 6.6  ALBUMIN 3.8 3.6 4.0 2.8*    Assessment and Plan: Drain Location: RUQ Date of placement: 11/26/23  Currently to: Drain collection device: gravity 24 hour output:  Output by Drain (mL) 11/24/23 0701 - 11/24/23 1900 11/24/23 1901 - 11/25/23 0700 11/25/23 0701 - 11/25/23 1900 11/25/23 1901 - 11/26/23 0700 11/26/23 0701 - 11/26/23 1623  Patient has no LDAs of requested type attached.    Interval imaging/drain manipulation:  None.  Current examination: Flushes/aspirates easily.  Insertion site unremarkable. Suture in place. Dressed appropriately.   Plan: Continue TID flushes with 5 cc NS. Record output Q shift. Dressing changes QD or PRN if soiled.  Call IR APP or on call IR MD if difficulty flushing or sudden change in drain output.  Repeat imaging/possible drain injection once output < 10 mL/QD (excluding flush material). Consideration for drain removal if output is < 10 mL/QD (excluding flush material), pending discussion with the providing surgical service.  IR will continue to follow - please call with questions or concerns.       Thank you for this interesting  consult.  I greatly enjoyed meeting DAIA HEBNER and look forward to participating in their care.   Electronically Signed: Lovena Rubinstein, PA-C 11/26/2023, 4:15 PM     I spent a total of 15 Minutes at the the patient's bedside AND on the patient's hospital floor or unit, greater than 50% of which was counseling/coordinating care for acute cholecystitis status post percutaneous drainage.

## 2023-11-26 NOTE — Progress Notes (Signed)
 .rdr          [facility]                                                                              Erica [name], is a 58 y.o. female, DOB - 06-09-1966, ZOX:[id number] Admit date - 11/23/2023    Outpatient Primary MD for the patient is System, Provider Not In  LOS - 2  days  Chief Complaint  Patient presents with   Abdominal Pain    Radiates to back       Brief summary   Patient is a 58 year old female with rectal cancer, metastatic disease on treatment with Xeloda  and XRT, chronic pain syndrome, gastric ulcer, DM 2, HTN, HLP, OSA, CHF, COPD, tobacco use, 1 pack/day, fatty liver, anxiety/depression, arthritis presented to ED with epigastric pain radiating around her back for at least a day prior to admission and not responding to oxycodone .  She underwent CT scan which showed cholelithiasis without cholecystitis.  Patient was discharged home from ED only to return back with worsening symptoms. RUQ US  showed hepatic steatosis, cholelithiasis with gallbladder wall thickening and pericholecystic fluid concerning for acute cholecystitis. General surgery consulted.   Assessment & Plan    Principal Problem: SIRS with Acute calculus cholecystitis -Presented with acute abdominal pain, tachycardia, leukocytosis, procalcitonin 1.4.  No lactic acidosis.  Source likely due to acute calculus cholecystitis - IR, General Surgery following - Underwent percutaneous cholecystostomy on 5/9 -Reviewed surgery recommendation, continue antibiotics until leukocytosis improves - Continue diet, advance as tolerated, pain control antiemetics as needed  - 2D echo for preop clearance showed EF 60 to 65%, normal function, no regional WMA, indeterminate diastolic parameters - Leukocytosis mildly improving 16.6  Active Problems:  Acute hyponatremia - Presented with sodium of 126, acute - placed on IV fluids, NA improved, 135      Chronic pain syndrome, chronic back pain - Continue oxycodone      Controlled type 2 diabetes mellitus without complication, without long-term current use of insulin  (HCC) -Hold PTA meds including Jardiance, metformin  - Hemoglobin A1c 5.6  CBG (last 3)  Recent Labs    11/25/23 1200 11/25/23 1956 11/26/23 0757  GLUCAP 123* 184* 159*   Continue sliding scale insulin  while inpatient  COPD with mild exacerbation, acute bronchitis Chronic respiratory failure with hypoxia OSA Chronic tobacco use -  reporting new productive cough and progressive dyspnea - Seen by pulmonology, continue Brovana , budesonide , Yupelri  in the perioperative.  - Continue low-dose IV Solu-Medrol  20 mg daily for 5 days, pulmonology following.   - Counseled on smoking cessation    HTN (hypertension) -BP stable     Hypokalemia -Replaced    Rectal cancer (HCC) with metastatic disease - Ongoing treatment with Xeloda  and radiation, follows up with Dr. Grayland Le  History of gastric ulcers, PUD - Continue PPI  Constipation - Placed on bowel regimen with MiraLAX , Senokot-S  Obesity class I Estimated body mass index is 25.84 kg/m as calculated from the following:   Height as of this encounter: 5\' 8"  (1.727 m).   Weight as of this encounter: 77.1 kg.  Code Status: Full code DVT Prophylaxis:  enoxaparin  (LOVENOX ) injection 40 mg Start: 11/26/23 1000  Level of Care: Level of care: Telemetry Family Communication: Updated patient Disposition Plan:      Remains inpatient appropriate: Once cleared by general surgery   Procedures:    Consultants:   General Surgery Pulmonology Cardiology  Antimicrobials:   Anti-infectives (From admission, onward)    Start     Dose/Rate Route Frequency Ordered Stop   11/24/23 0800  metroNIDAZOLE  (FLAGYL ) IVPB 500 mg        500 mg 100 mL/hr over 60 Minutes Intravenous Every 12 hours 11/23/23 2208     11/24/23 0400  ceFEPIme  (MAXIPIME ) 2 g in sodium chloride  0.9 % 100 mL IVPB        2 g 200 mL/hr over 30 Minutes Intravenous Every 8  hours 11/23/23 2208     11/23/23 1900  ceFEPIme  (MAXIPIME ) 2 g in sodium chloride  0.9 % 100 mL IVPB       Placed in "And" Linked Group   2 g 200 mL/hr over 30 Minutes Intravenous  Once 11/23/23 1858 11/24/23 1936   11/23/23 1900  metroNIDAZOLE  (FLAGYL ) IVPB 500 mg       Placed in "And" Linked Group   500 mg 100 mL/hr over 60 Minutes Intravenous  Once 11/23/23 1858 11/24/23 1937          Medications  arformoterol   15 mcg Nebulization BID   budesonide  (PULMICORT ) nebulizer solution  0.5 mg Nebulization BID   busPIRone   30 mg Oral BID   Chlorhexidine  Gluconate Cloth  6 each Topical Daily   diltiazem   120 mg Oral Daily   DULoxetine   60 mg Oral BID   enoxaparin  (LOVENOX ) injection  40 mg Subcutaneous Q24H   insulin  aspart  0-5 Units Subcutaneous QHS   insulin  aspart  0-9 Units Subcutaneous TID WC   methylPREDNISolone  (SOLU-MEDROL ) injection  20 mg Intravenous Daily   pantoprazole   40 mg Oral Daily   pregabalin   150 mg Oral TID   revefenacin   175 mcg Nebulization Daily   sodium chloride  flush  5 mL Intracatheter Q8H      Subjective:   Erica [name] was seen and examined today.  No acute complaints.  Tolerating diet.  Abdominal pain improving, no nausea vomiting, chest pain, shortness of breath, fevers.   Objective:   Vitals:   11/26/23 0142 11/26/23 0607 11/26/23 0808 11/26/23 0944  BP: 125/77 138/77  107/64  Pulse: 77 73  72  Resp: 16 16  18   Temp: 98 F (36.7 C) (!) 97.5 F (36.4 C)  (!) 97.5 F (36.4 C)  TempSrc: Oral Oral    SpO2: 94% 100% 94% 99%  Weight:      Height:        Intake/Output Summary (Last 24 hours) at 11/26/2023 1128 Last data filed at 11/26/2023 0954 Gross per 24 hour  Intake 1617.15 ml  Output 1495 ml  Net 122.15 ml     Wt Readings from Last 3 Encounters:  11/23/23 77.1 kg  11/17/23 77.5 kg  10/27/23 71.5 kg    Physical Exam General: Alert and oriented x 3, NAD Cardiovascular: S1 S2 clear, RRR.  Respiratory: CTAB, no  wheezing Gastrointestinal: Mild RUQ TTP, + drain bilious  Ext: no pedal edema bilaterally Neuro: no new deficits Psych: Normal affect      Data Reviewed:  I have personally reviewed following labs    CBC Lab Results  Component Value Date   WBC 16.6 (H) 11/26/2023   RBC 3.74 (L) 11/26/2023   HGB 11.7 (L) 11/26/2023  HCT 37.1 11/26/2023   MCV 99.2 11/26/2023   MCH 31.3 11/26/2023   PLT 229 11/26/2023   MCHC 31.5 11/26/2023   RDW 17.1 (H) 11/26/2023   LYMPHSABS 0.5 (L) 11/23/2023   MONOABS 1.0 11/23/2023   EOSABS 0.0 11/23/2023   BASOSABS 0.1 11/23/2023     Last metabolic panel Lab Results  Component Value Date   NA 135 11/26/2023   K 3.5 11/26/2023   CL 103 11/26/2023   CO2 22 11/26/2023   BUN 25 (H) 11/26/2023   CREATININE 0.84 11/26/2023   GLUCOSE 139 (H) 11/26/2023   GFRNONAA >60 11/26/2023   GFRAA >60 09/12/2018   CALCIUM  9.0 11/26/2023   PHOS 3.5 11/25/2023   PROT 6.6 11/25/2023   ALBUMIN 2.8 (L) 11/25/2023   LABGLOB 3.3 06/19/2015   AGRATIO 0.7 06/19/2015   BILITOT 1.2 11/25/2023   ALKPHOS 87 11/25/2023   AST 19 11/25/2023   ALT 18 11/25/2023   ANIONGAP 10 11/26/2023    CBG (last 3)  Recent Labs    11/25/23 1200 11/25/23 1956 11/26/23 0757  GLUCAP 123* 184* 159*      Coagulation Profile: Recent Labs  Lab 11/25/23 1136  INR 1.1     Radiology Studies: I have personally reviewed the imaging studies  ECHOCARDIOGRAM COMPLETE Result Date: 11/26/2023    ECHOCARDIOGRAM REPORT   Patient Name:   Erica [name] Date of Exam: 11/26/2023 Medical Rec #:  [id number]      Height:       68.0 in Accession #:    [id number]     Weight:       170.0 lb Date of Birth:  [birth year]      BSA:          1.907 m Patient Age:    58 years       BP:           138/77 mmHg Patient Gender: F              HR:           82 bpm. Exam Location:  Inpatient Procedure: 2D Echo, Cardiac Doppler and Color Doppler (Both Spectral and Color            Flow Doppler were utilized during  procedure). Indications:    CHF-acute diastolic  History:        Patient has prior history of Echocardiogram examinations, most                 recent 09/11/2018. COPD, Arrythmias:Tachycardia,                 Signs/Symptoms:Shortness of Breath, Chest Pain and Dyspnea; Risk                 Factors:Hypertension, Diabetes, Dyslipidemia and Sleep Apnea.  Sonographer:    Juanita Shaw Referring Phys: [id number] CALLIE E GOODRICH IMPRESSIONS  1. Technically difficult study. Left ventricular ejection fraction, by estimation, is 60 to 65%. The left ventricle has normal function. The left ventricle has no regional wall motion abnormalities. Left ventricular diastolic parameters are indeterminate.  2. Right ventricular systolic function is normal. The right ventricular size is normal. Tricuspid regurgitation signal is inadequate for assessing PA pressure.  3. The mitral valve is normal in structure. Trivial mitral valve regurgitation. No evidence of mitral stenosis.  4. The aortic valve was not well visualized. Aortic valve regurgitation is not visualized. No aortic stenosis is present. FINDINGS  Left Ventricle: Left ventricular ejection fraction, by  estimation, is 60 to 65%. The left ventricle has normal function. The left ventricle has no regional wall motion abnormalities. The left ventricular internal cavity size was normal in size. There is  no left ventricular hypertrophy. Left ventricular diastolic parameters are indeterminate. Right Ventricle: The right ventricular size is normal. Right vetricular wall thickness was not well visualized. Right ventricular systolic function is normal. Tricuspid regurgitation signal is inadequate for assessing PA pressure. Left Atrium: Left atrial size was normal in size. Right Atrium: Right atrial size was normal in size. Pericardium: Trivial pericardial effusion is present. Mitral Valve: The mitral valve is normal in structure. Trivial mitral valve regurgitation. No evidence of mitral valve  stenosis. MV peak gradient, 4.4 mmHg. The mean mitral valve gradient is 2.0 mmHg. Tricuspid Valve: The tricuspid valve is normal in structure. Tricuspid valve regurgitation is trivial. Aortic Valve: The aortic valve was not well visualized. Aortic valve regurgitation is not visualized. No aortic stenosis is present. Aortic valve mean gradient measures 4.0 mmHg. Aortic valve peak gradient measures 6.4 mmHg. Aortic valve area, by VTI measures 1.99 cm. Pulmonic Valve: The pulmonic valve was not well visualized. Pulmonic valve regurgitation is not visualized. Aorta: The aortic root and ascending aorta are structurally normal, with no evidence of dilitation. IAS/Shunts: The interatrial septum was not well visualized.  LEFT VENTRICLE PLAX 2D LVIDd:         4.50 cm      Diastology LVIDs:         3.00 cm      LV e' medial:    6.64 cm/s LV PW:         0.70 cm      LV E/e' medial:  11.2 LV IVS:        0.70 cm      LV e' lateral:   7.83 cm/s LVOT diam:     1.90 cm      LV E/e' lateral: 9.5 LV SV:         52 LV SV Index:   27 LVOT Area:     2.84 cm  LV Volumes (MOD) LV vol d, MOD A2C: 104.0 ml LV vol d, MOD A4C: 137.0 ml LV vol s, MOD A2C: 44.2 ml LV vol s, MOD A4C: 53.0 ml LV SV MOD A2C:     59.8 ml LV SV MOD A4C:     137.0 ml LV SV MOD BP:      75.1 ml RIGHT VENTRICLE             IVC RV Basal diam:  2.90 cm     IVC diam: 2.10 cm RV Mid diam:    2.00 cm RV S prime:     12.20 cm/s TAPSE (M-mode): 2.1 cm LEFT ATRIUM             Index        RIGHT ATRIUM           Index LA diam:        3.50 cm 1.84 cm/m   RA Area:     13.50 cm LA Vol (A2C):   37.4 ml 19.61 ml/m  RA Volume:   32.60 ml  17.09 ml/m LA Vol (A4C):   25.7 ml 13.48 ml/m LA Biplane Vol: 31.1 ml 16.31 ml/m  AORTIC VALVE                    PULMONIC VALVE AV Area (Vmax):    1.95 cm  PV Vmax:       0.90 m/s AV Area (Vmean):   1.74 cm     PV Peak grad:  3.3 mmHg AV Area (VTI):     1.99 cm AV Vmax:           126.00 cm/s AV Vmean:          88.400 cm/s AV VTI:             0.259 m AV Peak Grad:      6.4 mmHg AV Mean Grad:      4.0 mmHg LVOT Vmax:         86.80 cm/s LVOT Vmean:        54.300 cm/s LVOT VTI:          0.182 m LVOT/AV VTI ratio: 0.70  AORTA Ao Root diam: 3.30 cm Ao Asc diam:  2.70 cm MITRAL VALVE MV Area (PHT): 2.49 cm    SHUNTS MV Area VTI:   1.90 cm    Systemic VTI:  0.18 m MV Peak grad:  4.4 mmHg    Systemic Diam: 1.90 cm MV Mean grad:  2.0 mmHg MV Vmax:       1.05 m/s MV Vmean:      74.0 cm/s MV Decel Time: 305 msec MV E velocity: 74.30 cm/s MV A velocity: 87.50 cm/s MV E/A ratio:  0.85 Carson Clara MD Electronically signed by Carson Clara MD Signature Date/Time: 11/26/2023/9:52:19 AM    Final        Bertram Brocks M.D. [facility] 11/26/2023, 11:28 AM  Available via Epic secure chat 7am-7pm After 7 pm, please refer to night coverage provider listed on amion.

## 2023-11-26 NOTE — Progress Notes (Signed)
  Echocardiogram 2D Echocardiogram has been performed.  Loretto Belinsky L Myiesha Edgar RDCS 11/26/2023, 8:34 AM

## 2023-11-27 DIAGNOSIS — C20 Malignant neoplasm of rectum: Secondary | ICD-10-CM

## 2023-11-27 DIAGNOSIS — K810 Acute cholecystitis: Secondary | ICD-10-CM | POA: Diagnosis not present

## 2023-11-27 DIAGNOSIS — G893 Neoplasm related pain (acute) (chronic): Secondary | ICD-10-CM

## 2023-11-27 DIAGNOSIS — E871 Hypo-osmolality and hyponatremia: Secondary | ICD-10-CM | POA: Diagnosis not present

## 2023-11-27 DIAGNOSIS — Z515 Encounter for palliative care: Secondary | ICD-10-CM

## 2023-11-27 LAB — CBC
HCT: 35.4 % — ABNORMAL LOW (ref 36.0–46.0)
Hemoglobin: 11.2 g/dL — ABNORMAL LOW (ref 12.0–15.0)
MCH: 31.6 pg (ref 26.0–34.0)
MCHC: 31.6 g/dL (ref 30.0–36.0)
MCV: 100 fL (ref 80.0–100.0)
Platelets: 230 10*3/uL (ref 150–400)
RBC: 3.54 MIL/uL — ABNORMAL LOW (ref 3.87–5.11)
RDW: 16.8 % — ABNORMAL HIGH (ref 11.5–15.5)
WBC: 7.7 10*3/uL (ref 4.0–10.5)
nRBC: 0 % (ref 0.0–0.2)

## 2023-11-27 LAB — BASIC METABOLIC PANEL WITH GFR
Anion gap: 9 (ref 5–15)
BUN: 24 mg/dL — ABNORMAL HIGH (ref 6–20)
CO2: 21 mmol/L — ABNORMAL LOW (ref 22–32)
Calcium: 8.9 mg/dL (ref 8.9–10.3)
Chloride: 107 mmol/L (ref 98–111)
Creatinine, Ser: 0.72 mg/dL (ref 0.44–1.00)
GFR, Estimated: >60 mL/min (ref >60)
Glucose, Bld: 165 mg/dL — ABNORMAL HIGH (ref 70–99)
Potassium: 3.2 mmol/L — ABNORMAL LOW (ref 3.5–5.1)
Sodium: 137 mmol/L (ref 135–145)

## 2023-11-27 LAB — GLUCOSE, CAPILLARY
Glucose-Capillary: 112 mg/dL — ABNORMAL HIGH (ref 70–99)
Glucose-Capillary: 181 mg/dL — ABNORMAL HIGH (ref 70–99)

## 2023-11-27 MED ORDER — ONDANSETRON HCL 8 MG PO TABS: 8.0000 mg | ORAL_TABLET | Freq: Every day | ORAL | 0 refills | Status: DC | PRN

## 2023-11-27 MED ORDER — OXYCODONE HCL 10 MG PO TABS
10.0000 mg | ORAL_TABLET | Freq: Three times a day (TID) | ORAL | 0 refills | Status: DC | PRN
Start: 2023-11-27 — End: 2023-12-07

## 2023-11-27 MED ORDER — AMOXICILLIN-POT CLAVULANATE 875-125 MG PO TABS
1.0000 | ORAL_TABLET | Freq: Two times a day (BID) | ORAL | 0 refills | Status: AC
Start: 2023-11-27 — End: 2023-12-04

## 2023-11-27 MED ORDER — PREDNISONE 10 MG PO TABS: 20.0000 mg | ORAL_TABLET | Freq: Every day | ORAL | 0 refills | Status: AC

## 2023-11-27 MED ORDER — POTASSIUM CHLORIDE CRYS ER 20 MEQ PO TBCR
40.0000 meq | EXTENDED_RELEASE_TABLET | Freq: Once | ORAL | Status: AC
  Administered 2023-11-27: 40 meq via ORAL
  Filled 2023-11-27: qty 2

## 2023-11-27 MED ORDER — SENNOSIDES-DOCUSATE SODIUM 8.6-50 MG PO TABS: 1.0000 | ORAL_TABLET | Freq: Two times a day (BID) | ORAL | 0 refills | Status: AC

## 2023-11-27 MED ORDER — HEPARIN SOD (PORK) LOCK FLUSH 100 UNIT/ML IV SOLN
500.0000 [IU] | INTRAVENOUS | Status: AC | PRN
  Administered 2023-11-27: 500 [IU]

## 2023-11-27 MED ORDER — SODIUM CHLORIDE FLUSH 0.9 % IV SOLN
10.0000 mL | Freq: Every day | INTRAVENOUS | 1 refills | Status: AC
  Filled 2023-11-27 – 2023-11-28 (×2): qty 300, 30d supply, fill #0
  Filled 2023-12-27: qty 300, 30d supply, fill #1

## 2023-11-27 MED ORDER — POLYETHYLENE GLYCOL 3350 17 G PO PACK: 17.0000 g | PACK | Freq: Every day | ORAL | 0 refills | Status: AC | PRN

## 2023-11-27 NOTE — Plan of Care (Signed)

## 2023-11-27 NOTE — Progress Notes (Signed)
  BRIEF IR NOTE:  Patient was seen prior to discharge, and taught how to flush her drain. Few NS flushes given at that time, and patient is aware to pick up flush prescription at [facility] during the week. Discharge orders and instruction placed. Patient knows to expect call from IR scheduler for 6-8 week outpatient follow -up.    Electronically Signed: Lovena Rubinstein, PA-C 11/27/2023, 3:10 PM

## 2023-11-27 NOTE — TOC Transition Note (Signed)
 Transition of Care [facility]) - Discharge Note   Patient Details  Name: Erica [name] MRN: [id number] Date of Birth: 09-29-1965  [facility] of [facility]) CM/SW Contact:  Levie Ream, RN Phone Number: 11/27/2023, 1:44 PM   Clinical Narrative:    D/C orders received; no TOC needs.   Final next level of care: Home/Self Care     Patient Goals and CMS Choice Patient states their goals for this hospitalization and ongoing recovery are:: return home          Discharge Placement                       Discharge Plan and Services Additional resources added to the After Visit Summary for                                       Social Drivers of Health (SDOH) Interventions SDOH Screenings   Food Insecurity: No Food Insecurity (11/23/2023)  Housing: Low Risk  (11/23/2023)  Transportation Needs: No Transportation Needs (11/23/2023)  Utilities: Not At Risk (11/23/2023)  Depression (PHQ2-9): Medium Risk (02/17/2023)  Financial Resource Strain: High Risk (07/18/2017)  Physical Activity: Insufficiently Active (07/18/2017)  Social Connections: Socially Isolated (07/18/2017)  Stress: Stress Concern Present (07/18/2017)  Tobacco Use: High Risk (11/23/2023)     Readmission Risk Interventions     No data to display

## 2023-11-27 NOTE — Discharge Summary (Signed)
 Physician Discharge Summary   Patient: Erica [name] MRN: [id number] DOB: 06-Nov-1965  Admit date:     11/23/2023  Discharge date: 11/27/23  Discharge Physician: Bertram Brocks, MD    PCP: System, Provider Not In   Recommendations at discharge:   Continue Augmentin  875-125 mg twice daily for 7 days Drain instructions will be provided by IR prior to discharge Outpatient follow-up with Dr. Ramiro Burly in 1-2 weeks Outpatient follow-up with cardiology and pulmonology will be arranged  Discharge Diagnoses:    SIRS with Acute cholecystitis status post drain   Chronic pain syndrome Acute hyponatremia   HTN (hypertension)   Obesity (BMI 30.0-34.9)   Hypokalemia   Type 2 diabetes mellitus with hyperglycemia (HCC)   Rectal cancer [facility])    Hospital Course:  Patient is a 58 year old female with rectal cancer, metastatic disease on treatment with Xeloda  and XRT, chronic pain syndrome, gastric ulcer, DM 2, HTN, HLP, OSA, CHF, COPD, tobacco use, 1 pack/day, fatty liver, anxiety/depression, arthritis presented to ED with epigastric pain radiating around her back for at least a day prior to admission and not responding to oxycodone .  She underwent CT scan which showed cholelithiasis without cholecystitis.  Patient was discharged home from ED only to return back with worsening symptoms. RUQ US  showed hepatic steatosis, cholelithiasis with gallbladder wall thickening and pericholecystic fluid concerning for acute cholecystitis. General surgery consulted.  Assessment and Plan:  SIRS with Acute calculus cholecystitis -Presented with acute abdominal pain, tachycardia, leukocytosis, procalcitonin 1.4.  No lactic acidosis.  Source likely due to acute calculus cholecystitis - IR, General Surgery was consulted, underwent percutaneous cholecystostomy on 5/9 -Tolerating diet. - 2D echo for preop clearance showed EF 60 to 65%, normal function, no regional WMA, indeterminate diastolic parameters - Leukocytosis  improved, cleared by general surgery to discharge home. Patient will go home with a drain, outpatient follow-up with IR and general surgery.  Drain instructions will be provided by IR   Acute hyponatremia - Presented with sodium of 126, acute - Patient placed on IV fluids, resolved, sodium 137 at discharge  Hypokalemia Replaced     Chronic pain syndrome, chronic back pain - Continue oxycodone      Controlled type 2 diabetes mellitus without complication, without long-term current use of insulin  (HCC) - Resume Jardiance, metformin  - Hemoglobin A1c 5.6 -Patient was placed on sliding scale insulin  while inpatient   COPD with mild exacerbation, acute bronchitis Chronic respiratory failure with hypoxia OSA Chronic tobacco use -  reporting new productive cough and progressive dyspnea - Seen by pulmonology, continue Brovana , budesonide , Yupelri  in the perioperative period  - Placed on scheduled nebs, IV Solu-Medrol  20 mg daily -Transition to oral prednisone , counseled on smoking cessation - Outpatient follow-up with pulmonology     HTN (hypertension) -BP stable       Hypokalemia -Replaced     Rectal cancer (HCC) with metastatic disease - Ongoing treatment with Xeloda  and radiation, follows up with Dr. Grayland Le   History of gastric ulcers, PUD - Continue PPI   Constipation - Placed on bowel regimen with MiraLAX , Senokot-S   Obesity class I Estimated body mass index is 25.84 kg/m as calculated from the following:   Height as of this encounter: 5\' 8"  (1.727 m).   Weight as of this encounter: 77.1 kg.       Pain control - [place]  Controlled Substance Reporting System database was reviewed. and patient was instructed, not to drive, operate heavy machinery, perform activities at heights, swimming or participation in  water activities or provide baby-sitting services while on Pain, Sleep and Anxiety Medications; until their outpatient Physician has advised to do so again.  Also recommended to not to take more than prescribed Pain, Sleep and Anxiety Medications.  Consultants: General Surgery, IR Procedures performed: Underwent percutaneous cholecystostomy on 5/9   Disposition: Home Diet recommendation: Carb modified diet  DISCHARGE MEDICATION: Allergies as of 11/27/2023       Reactions   Tramadol Palpitations, Other (See Comments)   Heart Skipping Beats, Heart palpatations   Penicillins Rash        Medication List     TAKE these medications    albuterol  108 (90 Base) MCG/ACT inhaler Commonly known as: VENTOLIN  HFA Inhale 2 puffs into the lungs every 6 (six) hours as needed for wheezing or shortness of breath.   amoxicillin -clavulanate 875-125 MG tablet Commonly known as: AUGMENTIN  Take 1 tablet by mouth 2 (two) times daily for 7 days.   busPIRone  30 MG tablet Commonly known as: BUSPAR  Take 30 mg by mouth 2 (two) times daily. PER PT STATES HER DOSE WAS INCREASED   clonazePAM  0.5 MG tablet Commonly known as: KLONOPIN  Take 0.5 mg by mouth 2 (two) times daily.   cyclobenzaprine  10 MG tablet Commonly known as: FLEXERIL  Take 1 tablet (10 mg total) by mouth 3 (three) times daily as needed for muscle spasms. What changed: when to take this   diltiazem  120 MG 24 hr capsule Commonly known as: CARDIZEM  CD TAKE 1 CAPSULE BY MOUTH EVERY DAY   diltiazem  30 MG tablet Commonly known as: CARDIZEM  TAKE 1 TABLET(30 MG) BY MOUTH THREE TIMES DAILY AS NEEDED What changed: See the new instructions.   DULoxetine  60 MG capsule Commonly known as: CYMBALTA  Take 60 mg by mouth 2 (two) times daily.   Fluticasone -Salmeterol 250-50 MCG/DOSE Aepb Commonly known as: ADVAIR Inhale 1 puff into the lungs 2 (two) times daily.   furosemide  40 MG tablet Commonly known as: LASIX  TAKE 1 TABLET BY MOUTH EVERY DAY. MAY TAKE ADDITIONAL TABLET IN THE AM FOR WEIGHT GAIN AS NEEDED What changed:  how much to take how to take this when to take this additional  instructions   Jardiance 25 MG Tabs tablet Generic drug: empagliflozin Take 25 mg by mouth daily.   magic mouthwash (nystatin , lidocaine , diphenhydrAMINE, alum & mag hydroxide) suspension Take 5 mLs by mouth 4 (four) times daily as needed for mouth pain.   metFORMIN  500 MG 24 hr tablet Commonly known as: GLUCOPHAGE -XR Take 1,000 mg by mouth 2 (two) times daily.   ondansetron  8 MG tablet Commonly known as: ZOFRAN  Take 1 tablet (8 mg total) by mouth daily as needed for nausea or vomiting. What changed:  when to take this reasons to take this additional instructions   Oxycodone  HCl 10 MG Tabs Take 1 tablet (10 mg total) by mouth 3 (three) times daily as needed (for pain). What changed: reasons to take this   OXYGEN Inhale 2 L into the lungs at bedtime. Or as needed throughout the day if short of breath   pantoprazole  40 MG tablet Commonly known as: PROTONIX  Take 1 tablet (40 mg total) by mouth daily.   polyethylene glycol 17 g packet Commonly known as: MIRALAX  / GLYCOLAX  Take 17 g by mouth daily as needed for mild constipation or moderate constipation.   potassium chloride  SA 20 MEQ tablet Commonly known as: KLOR-CON  M Take 20 mEq by mouth 2 (two) times daily.   predniSONE  10 MG tablet Commonly known as:  DELTASONE  Take 2 tablets (20 mg total) by mouth daily for 2 days.   pregabalin  150 MG capsule Commonly known as: LYRICA  Take 1 capsule (150 mg total) by mouth 3 (three) times daily.   prochlorperazine  10 MG tablet Commonly known as: COMPAZINE  Take 1 tablet (10 mg total) by mouth every 6 (six) hours as needed for nausea or vomiting. What changed: when to take this   senna-docusate 8.6-50 MG tablet Commonly known as: Senokot-S Take 1 tablet by mouth 2 (two) times daily. For constipation   spironolactone  25 MG tablet Commonly known as: ALDACTONE  TAKE 1 TABLET(25 MG) BY MOUTH EVERY MORNING   zolpidem 10 MG tablet Commonly known as: AMBIEN Take 5-10 mg by mouth  at bedtime.        Follow-up Information     Edmon Gosling, MD. Schedule an appointment as soon as possible for a visit in 2 week(s).   Specialty: General Surgery Why: for hospital follow-up Contact information: [address], [address] [place] [address] [phone]         [facility]. Schedule an appointment as soon as possible for a visit in 1 week(s).   Specialty: Radiology Why: for hospital follow-up Contact information: [address] [place]  [address] [phone]               Discharge Exam: Cleavon Curls Weights   11/23/23 2155  Weight: 77.1 kg   S: No acute complaints, tolerating diet, cleared by general surgery to discharge home  BP 133/77 (BP Location: Right Arm)   Pulse 77   Temp (!) 97.4 F (36.3 C) (Oral)   Resp 15   Ht 5\' 8"  (1.727 m)   Wt 77.1 kg   LMP 03/13/2015 (Approximate) Comment: more than 2 years ago  SpO2 99%   BMI 25.84 kg/m   Physical Exam General: Alert and oriented x 3, NAD Cardiovascular: S1 S2 clear, RRR.  Respiratory: CTAB, no wheezing, rales or rhonchi Gastrointestinal: Soft, mild RUQ TTP, drain + Ext: no pedal edema bilaterally Neuro: no new deficits Psych: Normal affect    Condition at discharge: fair  The results of significant diagnostics from this hospitalization (including imaging, microbiology, ancillary and laboratory) are listed below for reference.   Imaging Studies: ECHOCARDIOGRAM COMPLETE Result Date: 11/26/2023    ECHOCARDIOGRAM REPORT   Patient Name:   JEDIDAH ALBACH Date of Exam: 11/26/2023 Medical Rec #:  [id number]      Height:       68.0 in Accession #:    [id number]     Weight:       170.0 lb Date of Birth:  1965/12/24      BSA:          1.907 m Patient Age:    58 years       BP:           138/77 mmHg Patient Gender: F              HR:           82 bpm. Exam Location:  Inpatient Procedure: 2D Echo, Cardiac Doppler and Color  Doppler (Both Spectral and Color            Flow Doppler were utilized during procedure). Indications:    CHF-acute diastolic  History:        Patient has prior history of Echocardiogram examinations, most  recent 09/11/2018. COPD, Arrythmias:Tachycardia,                 Signs/Symptoms:Shortness of Breath, Chest Pain and Dyspnea; Risk                 Factors:Hypertension, Diabetes, Dyslipidemia and Sleep Apnea.  Sonographer:    Juanita Shaw Referring Phys: [id number] CALLIE E GOODRICH IMPRESSIONS  1. Technically difficult study. Left ventricular ejection fraction, by estimation, is 60 to 65%. The left ventricle has normal function. The left ventricle has no regional wall motion abnormalities. Left ventricular diastolic parameters are indeterminate.  2. Right ventricular systolic function is normal. The right ventricular size is normal. Tricuspid regurgitation signal is inadequate for assessing PA pressure.  3. The mitral valve is normal in structure. Trivial mitral valve regurgitation. No evidence of mitral stenosis.  4. The aortic valve was not well visualized. Aortic valve regurgitation is not visualized. No aortic stenosis is present. FINDINGS  Left Ventricle: Left ventricular ejection fraction, by estimation, is 60 to 65%. The left ventricle has normal function. The left ventricle has no regional wall motion abnormalities. The left ventricular internal cavity size was normal in size. There is  no left ventricular hypertrophy. Left ventricular diastolic parameters are indeterminate. Right Ventricle: The right ventricular size is normal. Right vetricular wall thickness was not well visualized. Right ventricular systolic function is normal. Tricuspid regurgitation signal is inadequate for assessing PA pressure. Left Atrium: Left atrial size was normal in size. Right Atrium: Right atrial size was normal in size. Pericardium: Trivial pericardial effusion is present. Mitral Valve: The mitral valve is normal  in structure. Trivial mitral valve regurgitation. No evidence of mitral valve stenosis. MV peak gradient, 4.4 mmHg. The mean mitral valve gradient is 2.0 mmHg. Tricuspid Valve: The tricuspid valve is normal in structure. Tricuspid valve regurgitation is trivial. Aortic Valve: The aortic valve was not well visualized. Aortic valve regurgitation is not visualized. No aortic stenosis is present. Aortic valve mean gradient measures 4.0 mmHg. Aortic valve peak gradient measures 6.4 mmHg. Aortic valve area, by VTI measures 1.99 cm. Pulmonic Valve: The pulmonic valve was not well visualized. Pulmonic valve regurgitation is not visualized. Aorta: The aortic root and ascending aorta are structurally normal, with no evidence of dilitation. IAS/Shunts: The interatrial septum was not well visualized.  LEFT VENTRICLE PLAX 2D LVIDd:         4.50 cm      Diastology LVIDs:         3.00 cm      LV e' medial:    6.64 cm/s LV PW:         0.70 cm      LV E/e' medial:  11.2 LV IVS:        0.70 cm      LV e' lateral:   7.83 cm/s LVOT diam:     1.90 cm      LV E/e' lateral: 9.5 LV SV:         52 LV SV Index:   27 LVOT Area:     2.84 cm  LV Volumes (MOD) LV vol d, MOD A2C: 104.0 ml LV vol d, MOD A4C: 137.0 ml LV vol s, MOD A2C: 44.2 ml LV vol s, MOD A4C: 53.0 ml LV SV MOD A2C:     59.8 ml LV SV MOD A4C:     137.0 ml LV SV MOD BP:      75.1 ml RIGHT VENTRICLE  IVC RV Basal diam:  2.90 cm     IVC diam: 2.10 cm RV Mid diam:    2.00 cm RV S prime:     12.20 cm/s TAPSE (M-mode): 2.1 cm LEFT ATRIUM             Index        RIGHT ATRIUM           Index LA diam:        3.50 cm 1.84 cm/m   RA Area:     13.50 cm LA Vol (A2C):   37.4 ml 19.61 ml/m  RA Volume:   32.60 ml  17.09 ml/m LA Vol (A4C):   25.7 ml 13.48 ml/m LA Biplane Vol: 31.1 ml 16.31 ml/m  AORTIC VALVE                    PULMONIC VALVE AV Area (Vmax):    1.95 cm     PV Vmax:       0.90 m/s AV Area (Vmean):   1.74 cm     PV Peak grad:  3.3 mmHg AV Area (VTI):     1.99  cm AV Vmax:           126.00 cm/s AV Vmean:          88.400 cm/s AV VTI:            0.259 m AV Peak Grad:      6.4 mmHg AV Mean Grad:      4.0 mmHg LVOT Vmax:         86.80 cm/s LVOT Vmean:        54.300 cm/s LVOT VTI:          0.182 m LVOT/AV VTI ratio: 0.70  AORTA Ao Root diam: 3.30 cm Ao Asc diam:  2.70 cm MITRAL VALVE MV Area (PHT): 2.49 cm    SHUNTS MV Area VTI:   1.90 cm    Systemic VTI:  0.18 m MV Peak grad:  4.4 mmHg    Systemic Diam: 1.90 cm MV Mean grad:  2.0 mmHg MV Vmax:       1.05 m/s MV Vmean:      74.0 cm/s MV Decel Time: 305 msec MV E velocity: 74.30 cm/s MV A velocity: 87.50 cm/s MV E/A ratio:  0.85 Carson Clara MD Electronically signed by Carson Clara MD Signature Date/Time: 11/26/2023/9:52:19 AM    Final    US  Abdomen Limited RUQ (LIVER/GB) Result Date: 11/23/2023 CLINICAL DATA:  Right upper quadrant pain EXAM: ULTRASOUND ABDOMEN LIMITED RIGHT UPPER QUADRANT COMPARISON:  CT abdomen and pelvis 11/22/2023. Abdominal ultrasound 08/12/2018. FINDINGS: Gallbladder: Gallbladder wall thickened measuring 4 mm. There is a small amount of pericholecystic fluid. Gallbladder calculi are seen measuring up to 6 mm. Sonographic Abigail Abler sign is negative. Common bile duct: Diameter: 3 mm Liver: No focal lesion identified. Increased in parenchymal echogenicity. Portal vein is patent on color Doppler imaging with normal direction of blood flow towards the liver. Other: None. IMPRESSION: 1. Cholelithiasis with gallbladder wall thickening and pericholecystic fluid. Findings are concerning for acute cholecystitis. 2. Hepatic steatosis. Electronically Signed   By: Tyron Gallon M.D.   On: 11/23/2023 18:12   CT ABDOMEN PELVIS W CONTRAST Result Date: 11/22/2023 CLINICAL DATA:  Acute nonlocalized abdominal pain. History of lung and colon cancer. History of pancreatitis. EXAM: CT ABDOMEN AND PELVIS WITH CONTRAST TECHNIQUE: Multidetector CT imaging of the abdomen and pelvis was performed using the  standard protocol following  bolus administration of intravenous contrast. RADIATION DOSE REDUCTION: This exam was performed according to the departmental dose-optimization program which includes automated exposure control, adjustment of the mA and/or kV according to patient size and/or use of iterative reconstruction technique. CONTRAST:  OMNIPAQUE  IOHEXOL  300 MG/ML  SOLN COMPARISON:  CT chest abdomen pelvis 11/01/2023 FINDINGS: Lower chest: No acute abnormality. Hepatobiliary: Hepatic steatosis. No focal hepatic lesion. Cholelithiasis without evidence of acute cholecystitis. No biliary dilation Pancreas: Unremarkable. No pancreatic ductal dilatation or surrounding inflammatory changes. Spleen: Unremarkable. Adrenals/Urinary Tract: Nodular thickening in both adrenal glands is stable dating back to 01/03/2023. No urinary calculi or hydronephrosis. Unremarkable bladder. Stomach/Bowel: Asymmetric wall thickening about the rectum greater on the left (circa series 2/image 69) is grossly similar to 11/01/2023. There is adjacent fat stranding and trace fluid. Otherwise no thickening of the wall of the small or large bowel. Stomach is within normal limits. The appendix is not visualized. No bowel obstruction. Vascular/Lymphatic: Aortic atherosclerosis. No enlarged abdominal or pelvic lymph nodes. Reproductive: Uterus and bilateral adnexa are unremarkable. Other: No free intraperitoneal air.  No abscess. Musculoskeletal: No acute fracture or destructive osseous lesion. IMPRESSION: 1. Stable asymmetric wall thickening about the rectum greater on the left with adjacent spiculation and edema. 2. Hepatic steatosis. 3. Cholelithiasis without evidence of acute cholecystitis. 4. Aortic Atherosclerosis (ICD10-I70.0). Electronically Signed   By: Rozell Cornet M.D.   On: 11/22/2023 23:45   CT CHEST ABDOMEN PELVIS W CONTRAST Result Date: 11/10/2023 CLINICAL DATA:  Assess treatment response. Rectal cancer. Undergoing  radiation. * Tracking Code: BO *. EXAM: CT CHEST, ABDOMEN, AND PELVIS WITH CONTRAST TECHNIQUE: Multidetector CT imaging of the chest, abdomen and pelvis was performed following the standard protocol during bolus administration of intravenous contrast. RADIATION DOSE REDUCTION: This exam was performed according to the departmental dose-optimization program which includes automated exposure control, adjustment of the mA and/or kV according to patient size and/or use of iterative reconstruction technique. CONTRAST:  OMNIPAQUE  IOHEXOL  300 MG/ML  SOLN COMPARISON:  Abdomen pelvis CT 05/28/2023. Chest abdomen pelvis CT 05/13/2023. Older exams. FINDINGS: CT CHEST FINDINGS Cardiovascular: Right IJ chest port in place with tip of the catheter extends to the central SVC above the right atrium. The port is accessed. The thoracic aorta is normal course and caliber with slight calcified plaque. Heart is nonenlarged. No significant pericardial effusion. The previous thrombus in the SVC is decreased today. Trace residual suggested anteriorly. This could be more chronic thrombus. Please see sagittal series 7, image 132. This is along the tip of the catheter. Mediastinum/Nodes: Preserved thyroid gland. Slightly patulous thoracic esophagus. No specific abnormal lymph node enlargement identified in the axillary regions or hila. There are some small mediastinal nodes identified. These are slightly increased from previous chest CT. Example right paratracheal has short axis of 8 mm on series 2, image 25 today and previously 5 mm. AP window node today has short axis dimension of 6 mm today and previously 3 mm. Left paratracheal node as well. Attention on short follow-up. There is some small pre cardiophrenic nodes which are stable. Stable cystic areas along the course of the thoracic duct at the level of the diaphragm. Lungs/Pleura: No consolidation, pneumothorax or effusion. Minimal areas of scarring atelectatic changes as well as  some minimal areas of ground-glass in the right upper lobe. The ground-glass is new and nonspecific. Few tiny lung nodules are also identified. Example includes a middle lobe focus on series 5, image 91 measuring 4 mm. Unchanged from previous when  adjusted for technique. Lateral juxtapleural left lower lobe nodule which previously measured 3 mm is stable today on image 113 of series 5. Other small lesions are also stable. There is 1 new small nodule in the medial left upper lobe measuring 4 mm on series 5, image 65. Musculoskeletal: Old right-sided rib fracture. Mild degenerative changes along the spine. CT ABDOMEN PELVIS FINDINGS Hepatobiliary: Mild fatty liver infiltration. Some sparing along the course of the gallbladder. Patent portal vein. No space-occupying liver lesion. The gallbladder is dilated with some dependent stones. Pancreas: Unremarkable. No pancreatic ductal dilatation or surrounding inflammatory changes. The previous fluid and stranding or along the pancreas and duodenum has resolved. Spleen: Normal in size without focal abnormality. Small splenule anterior superior. Adrenals/Urinary Tract: The areas of nodular thickening along the adrenal glands are again noted and not significant changed from previous. Slightly malrotated right kidney, congenital. No enhancing renal mass or collecting system dilatation. The ureters have a normal course and caliber extending down to the urinary bladder. Preserved contour to the urinary bladder. Stomach/Bowel: Oral contrast was administered. There is a large amount of diffuse colonic stool. No large bowel dilatation. Normal appendix. The area of asymmetric thickening along the rectum towards the right side with some surface spiculation and perirectal fat stranding is stable. Please correlate for the exact location of the patient's neoplasm. Moderate debris in the stomach. Small bowel is nondilated. The previous edema involving the second third portion of duodenum  has resolved. The previous areas of poor wall enhancement have also improved. Please correlate with clinical history. Vascular/Lymphatic: Normal caliber aorta and IVC. Diffuse vascular calcifications along the aorta and branch vessels. There are several small but prominent retroperitoneal nodes identified, unchanged from previous examination. These are not pathologic by size criteria. No new pathologic nodes identified at this time. Reproductive: Uterus and bilateral adnexa are unremarkable. Other: Mild anasarca.  No free intra-abdominal air or free fluid. Musculoskeletal: Curvature and degenerative changes along the spine. Degenerative changes of the pelvis. Global muscle fatty atrophy. IMPRESSION: Stable asymmetric wall thickening along the rectum with adjacent spiculation and edema. No developing abnormal lymph node enlargement seen in the abdomen pelvis. No hepatic lesions. There are some small nodes in the mediastinum which are slightly larger compared to the prior chest CT of October 2024 but still small. Recommend attention on follow-up. Right IJ chest port. The previous thrombus along the SVC next of the catheter is decreasing. Some residual. Fatty liver infiltration. Gallstone. Moderate colonic stool. Coronary artery calcifications. Please correlate for other coronary risk factors. Stable small adrenal nodules. Previous edema and fluid near the pancreas and duodenum has resolved. Electronically Signed   By: Adrianna Horde M.D.   On: 11/10/2023 14:42    Microbiology: Results for orders placed or performed during the hospital encounter of 04/22/22  Blood culture (single)     Status: None   Collection Time: 04/22/22  5:52 PM   Specimen: BLOOD  Result Value Ref Range Status   Specimen Description BLOOD RIGHT ANTECUBITAL  Final   Special Requests   Final    BOTTLES DRAWN AEROBIC AND ANAEROBIC Blood Culture adequate volume   Culture   Final    NO GROWTH 5 DAYS Performed at [facility], [address], [place], [place] [address]    Report Status 04/27/2022 FINAL  Final    Labs: CBC: Recent Labs  Lab 11/23/23 1609 11/24/23 0258 11/25/23 0354 11/26/23 0046 11/27/23 0259  WBC 17.2* 17.8* 17.5* 16.6* 7.7  NEUTROABS  15.5*  --   --   --   --   HGB 15.1* 13.1 11.9* 11.7* 11.2*  HCT 45.6 39.8 37.1 37.1 35.4*  MCV 96.2 95.7 99.7 99.2 100.0  PLT 270 200 166 229 230   Basic Metabolic Panel: Recent Labs  Lab 11/23/23 1609 11/24/23 0258 11/25/23 0354 11/26/23 0046 11/27/23 0259  NA 126* 132* 137 135 137  K 4.0 3.4* 3.3* 3.5 3.2*  CL 90* 97* 105 103 107  CO2 22 24 20* 22 21*  GLUCOSE 195* 126* 128* 139* 165*  BUN 11 11 16  25* 24*  CREATININE 0.78 0.74 0.73 0.84 0.72  CALCIUM  9.5 8.7* 8.5* 9.0 8.9  MG  --   --  2.2  --   --   PHOS  --   --  3.5  --   --    Liver Function Tests: Recent Labs  Lab 11/22/23 2005 11/23/23 1609 11/25/23 0354  AST 27 25 19   ALT 19 20 18   ALKPHOS 95 111 87  BILITOT 0.4 1.2 1.2  PROT 7.7 8.4* 6.6  ALBUMIN 3.6 4.0 2.8*   CBG: Recent Labs  Lab 11/26/23 0757 11/26/23 1156 11/26/23 1643 11/26/23 2015 11/27/23 0745  GLUCAP 159* 186* 156* 174* 112*    Discharge time spent: greater than 30 minutes.  Signed: Bertram Brocks, MD [facility] 11/27/2023

## 2023-11-27 NOTE — Progress Notes (Signed)
 Subjective/Chief Complaint: No complaints. Feels better., Leukocytosis resolved   Objective: Vital signs in last 24 hours: Temp:  [97.4 F (36.3 C)-97.5 F (36.4 C)] 97.4 F (36.3 C) (05/10 2017) Pulse Rate:  [77-79] 77 (05/11 0613) Resp:  [15-16] 15 (05/11 0613) BP: (101-138)/(77-89) 133/77 (05/11 9604) SpO2:  [98 %-100 %] 99 % (05/11 0929) Last BM Date : 11/24/23  Intake/Output from previous day: 05/10 0701 - 05/11 0700 In: 2507 [P.O.:1725; IV Piggyback:782] Out: 3665 [Urine:3200] Intake/Output this shift: Total I/O In: -  Out: 30 [Other:30]  General appearance: alert and cooperative Resp: clear to auscultation bilaterally Cardio: regular rate and rhythm GI: soft,minimal discomfort around drain insertion site. Drain output bilious  Lab Results:  Recent Labs    11/26/23 0046 11/27/23 0259  WBC 16.6* 7.7  HGB 11.7* 11.2*  HCT 37.1 35.4*  PLT 229 230   BMET Recent Labs    11/26/23 0046 11/27/23 0259  NA 135 137  K 3.5 3.2*  CL 103 107  CO2 22 21*  GLUCOSE 139* 165*  BUN 25* 24*  CREATININE 0.84 0.72  CALCIUM  9.0 8.9   PT/INR Recent Labs    11/25/23 1136  LABPROT 14.7  INR 1.1   ABG No results for input(s): "PHART", "HCO3" in the last 72 hours.  Invalid input(s): "PCO2", "PO2"  Studies/Results: ECHOCARDIOGRAM COMPLETE Result Date: 11/26/2023    ECHOCARDIOGRAM REPORT   Patient Name:   Erica [name] Date of Exam: 11/26/2023 Medical Rec #:  [id number]      Height:       68.0 in Accession #:    [id number]     Weight:       170.0 lb Date of Birth:  09/20/1965      BSA:          1.907 m Patient Age:    58 years       BP:           138/77 mmHg Patient Gender: F              HR:           82 bpm. Exam Location:  Inpatient Procedure: 2D Echo, Cardiac Doppler and Color Doppler (Both Spectral and Color            Flow Doppler were utilized during procedure). Indications:    CHF-acute diastolic  History:        Patient has prior history of Echocardiogram  examinations, most                 recent 09/11/2018. COPD, Arrythmias:Tachycardia,                 Signs/Symptoms:Shortness of Breath, Chest Pain and Dyspnea; Risk                 Factors:Hypertension, Diabetes, Dyslipidemia and Sleep Apnea.  Sonographer:    Juanita Shaw Referring Phys: [id number] CALLIE E GOODRICH IMPRESSIONS  1. Technically difficult study. Left ventricular ejection fraction, by estimation, is 60 to 65%. The left ventricle has normal function. The left ventricle has no regional wall motion abnormalities. Left ventricular diastolic parameters are indeterminate.  2. Right ventricular systolic function is normal. The right ventricular size is normal. Tricuspid regurgitation signal is inadequate for assessing PA pressure.  3. The mitral valve is normal in structure. Trivial mitral valve regurgitation. No evidence of mitral stenosis.  4. The aortic valve was not well visualized. Aortic valve regurgitation is not visualized. No aortic stenosis  is present. FINDINGS  Left Ventricle: Left ventricular ejection fraction, by estimation, is 60 to 65%. The left ventricle has normal function. The left ventricle has no regional wall motion abnormalities. The left ventricular internal cavity size was normal in size. There is  no left ventricular hypertrophy. Left ventricular diastolic parameters are indeterminate. Right Ventricle: The right ventricular size is normal. Right vetricular wall thickness was not well visualized. Right ventricular systolic function is normal. Tricuspid regurgitation signal is inadequate for assessing PA pressure. Left Atrium: Left atrial size was normal in size. Right Atrium: Right atrial size was normal in size. Pericardium: Trivial pericardial effusion is present. Mitral Valve: The mitral valve is normal in structure. Trivial mitral valve regurgitation. No evidence of mitral valve stenosis. MV peak gradient, 4.4 mmHg. The mean mitral valve gradient is 2.0 mmHg. Tricuspid Valve: The  tricuspid valve is normal in structure. Tricuspid valve regurgitation is trivial. Aortic Valve: The aortic valve was not well visualized. Aortic valve regurgitation is not visualized. No aortic stenosis is present. Aortic valve mean gradient measures 4.0 mmHg. Aortic valve peak gradient measures 6.4 mmHg. Aortic valve area, by VTI measures 1.99 cm. Pulmonic Valve: The pulmonic valve was not well visualized. Pulmonic valve regurgitation is not visualized. Aorta: The aortic root and ascending aorta are structurally normal, with no evidence of dilitation. IAS/Shunts: The interatrial septum was not well visualized.  LEFT VENTRICLE PLAX 2D LVIDd:         4.50 cm      Diastology LVIDs:         3.00 cm      LV e' medial:    6.64 cm/s LV PW:         0.70 cm      LV E/e' medial:  11.2 LV IVS:        0.70 cm      LV e' lateral:   7.83 cm/s LVOT diam:     1.90 cm      LV E/e' lateral: 9.5 LV SV:         52 LV SV Index:   27 LVOT Area:     2.84 cm  LV Volumes (MOD) LV vol d, MOD A2C: 104.0 ml LV vol d, MOD A4C: 137.0 ml LV vol s, MOD A2C: 44.2 ml LV vol s, MOD A4C: 53.0 ml LV SV MOD A2C:     59.8 ml LV SV MOD A4C:     137.0 ml LV SV MOD BP:      75.1 ml RIGHT VENTRICLE             IVC RV Basal diam:  2.90 cm     IVC diam: 2.10 cm RV Mid diam:    2.00 cm RV S prime:     12.20 cm/s TAPSE (M-mode): 2.1 cm LEFT ATRIUM             Index        RIGHT ATRIUM           Index LA diam:        3.50 cm 1.84 cm/m   RA Area:     13.50 cm LA Vol (A2C):   37.4 ml 19.61 ml/m  RA Volume:   32.60 ml  17.09 ml/m LA Vol (A4C):   25.7 ml 13.48 ml/m LA Biplane Vol: 31.1 ml 16.31 ml/m  AORTIC VALVE                    PULMONIC VALVE AV  Area (Vmax):    1.95 cm     PV Vmax:       0.90 m/s AV Area (Vmean):   1.74 cm     PV Peak grad:  3.3 mmHg AV Area (VTI):     1.99 cm AV Vmax:           126.00 cm/s AV Vmean:          88.400 cm/s AV VTI:            0.259 m AV Peak Grad:      6.4 mmHg AV Mean Grad:      4.0 mmHg LVOT Vmax:         86.80 cm/s  LVOT Vmean:        54.300 cm/s LVOT VTI:          0.182 m LVOT/AV VTI ratio: 0.70  AORTA Ao Root diam: 3.30 cm Ao Asc diam:  2.70 cm MITRAL VALVE MV Area (PHT): 2.49 cm    SHUNTS MV Area VTI:   1.90 cm    Systemic VTI:  0.18 m MV Peak grad:  4.4 mmHg    Systemic Diam: 1.90 cm MV Mean grad:  2.0 mmHg MV Vmax:       1.05 m/s MV Vmean:      74.0 cm/s MV Decel Time: 305 msec MV E velocity: 74.30 cm/s MV A velocity: 87.50 cm/s MV E/A ratio:  0.85 Carson Clara MD Electronically signed by Carson Clara MD Signature Date/Time: 11/26/2023/9:52:19 AM    Final     Anti-infectives: Anti-infectives (From admission, onward)    Start     Dose/Rate Route Frequency Ordered Stop   11/24/23 0800  metroNIDAZOLE  (FLAGYL ) IVPB 500 mg        500 mg 100 mL/hr over 60 Minutes Intravenous Every 12 hours 11/23/23 2208     11/24/23 0400  ceFEPIme  (MAXIPIME ) 2 g in sodium chloride  0.9 % 100 mL IVPB        2 g 200 mL/hr over 30 Minutes Intravenous Every 8 hours 11/23/23 2208     11/23/23 1900  ceFEPIme  (MAXIPIME ) 2 g in sodium chloride  0.9 % 100 mL IVPB       Placed in "And" Linked Group   2 g 200 mL/hr over 30 Minutes Intravenous  Once 11/23/23 1858 11/24/23 1936   11/23/23 1900  metroNIDAZOLE  (FLAGYL ) IVPB 500 mg       Placed in "And" Linked Group   500 mg 100 mL/hr over 60 Minutes Intravenous  Once 11/23/23 1858 11/24/23 1937       Assessment/Plan: s/p * No surgery found * Calculous cholecystitis 58 yo F with oligometastatic rectal cancer s/p chemo on xeloda  and radiation and complex medical history as outlined in EMR included CHF, COPD, chronic smoker, fatty liver disease who presents with cholelithiasis with cholecystitis.  - Okay for discharge from surgical perspective - Can follow up with IR outpatient, and follow up with [facility] in ~6 weeks. She is high risk for surgery given comorbidities. So will need to strongly consider risks/benefits of long term drain versus removal with cholecystectomy,  versus removal with risk of recurrent cholecystitis given her cholelithiasis which would not typically be our standard of care. Another option would be if there is still consideration of resection of her rectal cancer, cholecystectomy at same surgery. Will discuss this with my partner, Dr. Camilo Cella, who has seen patient in clinic to discuss possible role of operation in oligometastatic disease.  Continue drain  LOS: 3 days    Edmon Gosling 11/27/2023

## 2023-11-27 NOTE — Discharge Instructions (Signed)
 Interventional Radiology Percutaneous Abscess Drain Placement After Care   This sheet gives you information about how to care for yourself after your procedure. Your health care provider may also give you more specific instructions. Your drain was placed by an interventional radiologist with [facility]. If you have questions or concerns, contact [facility] at [phone].   What is a percutaneous drain?   A drain is a small plastic tube (catheter) that goes into the fluid collection in your body through your skin.   How long will I need the drain?   How long the drain needs to stay in is determined by where the drain is, how much comes out of the drain each day and if you are having any other surgical procedures.   Interventional radiology will determine when it is time to remove the drain. It is important to follow up as directed so that the drain can be removed as soon as it is safe to do so.   What can I expect after the procedure?   After the procedure, it is common to have:   A small amount of bruising and discomfort in the area where the drainage tube (catheter) was placed.   Sleepiness and fatigue. This should go away after the medicines you were given have worn off.   Follow these instructions at home:   Insertion site care   Check your insertion site when you change the bandage. Check for:   More redness, swelling, or pain.   More fluid or blood.   Warmth.   Pus or a bad smell.   When caring for your insertion site:   Wash your hands with soap and water for at least 20 seconds before and after you change your bandage (dressing). If soap and water are not available, use hand sanitizer.   You do not need to change your dressing everyday if it is clean and dry. Change your dressing every 3 days or as needed when it is soiled, wet or becoming dislodged. You will need to change your dressing each time you shower.   Leave stitches (sutures), skin  glue, or adhesive strips in place. These skin closures may need to stay in place for 2 weeks or longer. If adhesive strip edges start to loosen and curl up, you may trim the loose edges. Do not remove adhesive strips completely unless your health care provider tells you to do so.   Catheter care   Flush the catheter once per day with 5 mL of 0.9% normal saline unless you are told otherwise by your healthcare provider. This helps to prevent clogs in the catheter.   To disconnect the drain, turn the clear plastic tube to the left. Attach the saline syringe by placing it on the white end of the drain and turning gently to the right. Once attached gently push the plunger to the 5 mL mark. After you are done flushing, disconnect the syringe by turning to the left and reattach your drainage container   If you have a bulb please be sure the bulb is charged after reconnecting it - to do this pinch the bulb between your thumb and first finger and close the stopper located on the top of the bulb.    Check for fluid leaking from around your catheter (instead of fluid draining through your catheter). This may be a sign that the drain is no longer working correctly.   Write down the following information every time you empty your  bag:   The date and time.   The amount of drainage.   Activity   Rest at home for 1-2 days after your procedure.   For the first 48 hours do not lift anything more than 10 lbs (about a gallon of milk). You may perform moderate activities/exercise. Please avoid strenuous activities during this time.   Avoid any activities which may pull on your drain as this can cause your drain to become dislodged.   If you were given a sedative during the procedure, it can affect you for several hours. Do not drive or operate machinery until your health care provider says that it is safe.   General instructions   For mild pain take over-the-counter medications as needed for pain such as  Tylenol or Advil. If you are experiencing severe pain please call our office as this may indicate an issue with your drain.    If you were prescribed an antibiotic medicine, take it as told by your health care provider. Do not stop using the antibiotic even if you start to feel better.   You may shower 24 hours after the drain is placed. To do this cover the insertion site with a water tight material such as saran wrap and seal the edges with tape, you may also purchase waterproof dressings at your local drug store. Shower as usual and then remove the water tight dressing and any gauze/tape underneath it once you have exited the shower and dried off. Allow the area to air dry or pat dry with a clean towel. Once the skin is completely dry place a new gauze dressing. It is important to keep the site dry at all times to prevent infection.   Do not submerge the drain - this means you cannot take baths, swim, use a hot tub, etc. until the drain is removed.    Do not use any products that contain nicotine or tobacco, such as cigarettes, e-cigarettes, and chewing tobacco. If you need help quitting, ask your health care provider.   Keep all follow-up visits as told by your health care provider. This is important.   Contact a health care provider if:   You have less than 10 mL of drainage a day for 2-3 days in a row, or as directed by your health care provider.   You have any of these signs of infection:   More redness, swelling, or pain around your incision area.   More fluid or blood coming from your incision area.   Warmth coming from your incision area.   Pus or a bad smell coming from your incision area.   You have fluid leaking from around your catheter (instead of through your catheter).   You are unable to flush the drain.   You have a fever or chills.   You have pain that does not get better with medicine.   You have not been contacted to schedule a drain follow up appointment  within 10 days of discharge from the hospital.   Please call [facility] at [phone] with any questions or concerns.   Get help right away if:   Your catheter comes out.   You suddenly stop having drainage from your catheter.   You suddenly have blood in the fluid that is draining from your catheter.   You become dizzy or you faint.   You develop a rash.   You have nausea or vomiting.   You have difficulty breathing or you feel short  of breath.   You develop chest pain.   You have problems with your speech or vision.   You have trouble balancing or moving your arms or legs.   Summary   It is common to have a small amount of bruising and discomfort in the area where the drainage tube (catheter) was placed. You may also have minor discomfort with movement while the drain is in place.   Flush the drain once per day with 5 mL of 0.9% normal saline (unless you were told otherwise by your healthcare provider).    Record the amount of drainage from the bag every time you empty it.   Change the dressing every 3 days or earlier if soiled/wet. Keep the skin dry under the dressing.   You may shower with the drain in place. Do not submerge the drain (no baths, swimming, hot tubs, etc.).   Contact [facility] at [phone] if you have more redness, swelling, or pain around your incision area or if you have pain that does not get better with medicine.   This information is not intended to replace advice given to you by your health care provider. Make sure you discuss any questions you have with your health care provider.   Document Revised: 10/08/2021 Document Reviewed: 06/30/2019   Elsevier Patient Education  2023 [employer]         Interventional Radiology Drain Record   Empty your drain at least once per day. You may empty it as often as needed. Use this form to write down the amount of fluid that has collected in the drainage container. Bring  this form with you to your follow-up visits. Please call [facility] at [phone] with any questions or concerns prior to your appointment.   Drain #1 location: ___________________   Date __________ Time __________ Amount __________   Date __________ Time __________ Amount __________   Date __________ Time __________ Amount __________   Date __________ Time __________ Amount __________   Date __________ Time __________ Amount __________   Date __________ Time __________ Amount __________   Date __________ Time __________ Amount __________   Date __________ Time __________ Amount __________   Date __________ Time __________ Amount __________   Date __________ Time __________ Amount __________   Date __________ Time __________ Amount __________   Date __________ Time __________ Amount __________   Date __________ Time __________ Amount __________   Date __________ Time __________ Amount __________

## 2023-11-27 NOTE — Progress Notes (Signed)
 Assessment unchanged. Pt and daughter verbalized understanding of dc instructions including medications, follow up care, drain care per IR PT, and when to call the doctor. Discharged via wc to front entrance accompanied by NT.

## 2023-11-28 ENCOUNTER — Other Ambulatory Visit (HOSPITAL_COMMUNITY): Payer: Self-pay

## 2023-11-28 ENCOUNTER — Encounter: Payer: Self-pay | Admitting: Hematology

## 2023-11-29 ENCOUNTER — Other Ambulatory Visit (HOSPITAL_COMMUNITY): Payer: Self-pay

## 2023-11-29 ENCOUNTER — Telehealth: Payer: Self-pay

## 2023-11-29 NOTE — Telephone Encounter (Signed)
 Pt called to r/s missed palliative appt, verified with pt. No further needs at this time.

## 2023-12-01 ENCOUNTER — Other Ambulatory Visit (HOSPITAL_COMMUNITY): Payer: Self-pay

## 2023-12-01 ENCOUNTER — Encounter: Payer: Self-pay | Admitting: Hematology

## 2023-12-07 ENCOUNTER — Encounter: Payer: Self-pay | Admitting: Nurse Practitioner

## 2023-12-07 ENCOUNTER — Inpatient Hospital Stay (HOSPITAL_BASED_OUTPATIENT_CLINIC_OR_DEPARTMENT_OTHER): Admitting: Nurse Practitioner

## 2023-12-07 VITALS — BP 145/70 | HR 103 | Temp 97.5°F | Resp 18 | Wt 157.4 lb

## 2023-12-07 DIAGNOSIS — C20 Malignant neoplasm of rectum: Secondary | ICD-10-CM

## 2023-12-07 DIAGNOSIS — Z515 Encounter for palliative care: Secondary | ICD-10-CM

## 2023-12-07 DIAGNOSIS — R530 Neoplastic (malignant) related fatigue: Secondary | ICD-10-CM

## 2023-12-07 DIAGNOSIS — G893 Neoplasm related pain (acute) (chronic): Secondary | ICD-10-CM

## 2023-12-07 MED ORDER — OXYCODONE HCL 10 MG PO TABS
10.0000 mg | ORAL_TABLET | Freq: Four times a day (QID) | ORAL | 0 refills | Status: DC | PRN
Start: 2023-12-07 — End: 2024-01-05

## 2023-12-07 NOTE — Progress Notes (Addendum)
 Palliative Medicine [facility]  [phone] Fax:(336) [phone]   Name: Erica [name] Date: 12/07/2023 MRN: [id number]  DOB: 11-28-1965  Patient Care Team: System, Provider Not In as PCP - General Gollan, Deadra Everts, MD as PCP - Cardiology (Cardiology) Charlette Console, FNP as Nurse Practitioner (Family Medicine) Devorah Fonder, MD as Consulting Physician (Cardiology) Valiant Gaul, MD (Inactive) as Consulting Physician (Pulmonary Disease) Burnie Cartwright, RN as Registered Nurse Arlette Benders, RN (Inactive) as Registered Nurse Rochell Chroman, RN as Oncology Nurse Navigator Sonja Indios, MD as Consulting Physician (Oncology) Pickenpack-Cousar, Giles Labrum, NP as Nurse Practitioner (Hospice and Palliative Medicine) Johna Myers, MD as Consulting Physician (Radiation Oncology) Luke Salaam, MD as Consulting Physician (Gastroenterology) Vergia Glasgow, MD as Consulting Physician (Pulmonary Disease)    INTERVAL HISTORY: Erica [name] is a 58 y.o. 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 Palliative ask to see for symptom management and goals of care.   SOCIAL HISTORY:     reports that she has been smoking cigarettes. She has a 138 pack-year smoking history. She has never used smokeless tobacco. She reports current alcohol use. She reports that she does not use drugs.  ADVANCE DIRECTIVES:  None on file   CODE STATUS: Full code  PAST MEDICAL HISTORY: Past Medical History:  Diagnosis Date   (HFpEF) heart failure with preserved ejection fraction (HCC)    a. 05/2015 Echo: EF 60-65%, no rwma, PASP .   Acute pancreatitis 08/13/2018   Anxiety    Arthritis    Asthma    Bell's palsy    no deficit   Bronchitis    Cancer (HCC) 12/2022   rectal cancer   CHF (congestive heart failure) (HCC)    COPD (chronic obstructive pulmonary  disease) (HCC)    Depression    Diabetes mellitus without complication (HCC)    type II 03/2017   Dyspnea    Fatty liver    per patient   Gall stones    per patient   Gastric ulcer    Hyperlipidemia    Hypertension    Lung nodule 12/2022   upper left   OSA (obstructive sleep apnea)    a. did not tolerate CPAP. On oxygen 2L via [place]   Pancreatitis    07/2018   Shoulder injury    6/19   Smoker     ALLERGIES:  is allergic to tramadol and penicillins.  MEDICATIONS:  Current Outpatient Medications  Medication Sig Dispense Refill   albuterol  (VENTOLIN  HFA) 108 (90 Base) MCG/ACT inhaler Inhale 2 puffs into the lungs every 6 (six) hours as needed for wheezing or shortness of breath.      busPIRone  (BUSPAR ) 30 MG tablet Take 30 mg by mouth 2 (two) times daily. PER PT STATES HER DOSE WAS INCREASED     clonazePAM  (KLONOPIN ) 0.5 MG tablet Take 0.5 mg by mouth 2 (two) times daily.     cyclobenzaprine  (FLEXERIL ) 10 MG tablet Take 1 tablet (10 mg total) by mouth 3 (three) times daily as needed for muscle spasms. (Patient taking differently: Take 10 mg by mouth 3 (three) times daily.) 90 tablet 2   diltiazem  (CARDIZEM  CD) 120 MG 24 hr capsule TAKE 1 CAPSULE BY MOUTH EVERY DAY 180 capsule 2   diltiazem  (CARDIZEM ) 30 MG tablet TAKE 1 TABLET(30 MG) BY MOUTH THREE TIMES DAILY AS NEEDED (Patient taking differently: Take  30 mg by mouth 2 (two) times daily.) 90 tablet 3   DULoxetine  (CYMBALTA ) 60 MG capsule Take 60 mg by mouth 2 (two) times daily.      Fluticasone -Salmeterol (ADVAIR) 250-50 MCG/DOSE AEPB Inhale 1 puff into the lungs 2 (two) times daily.     furosemide  (LASIX ) 40 MG tablet TAKE 1 TABLET BY MOUTH EVERY DAY. MAY TAKE ADDITIONAL TABLET IN THE AM FOR WEIGHT GAIN AS NEEDED (Patient taking differently: Take 40 mg by mouth daily.) 110 tablet 2   JARDIANCE 25 MG TABS tablet Take 25 mg by mouth daily.     magic mouthwash (nystatin , lidocaine , diphenhydrAMINE, alum & mag hydroxide) suspension Take  5 mLs by mouth 4 (four) times daily as needed for mouth pain. 140 mL 1   metFORMIN  (GLUCOPHAGE -XR) 500 MG 24 hr tablet Take 1,000 mg by mouth 2 (two) times daily.     ondansetron  (ZOFRAN ) 8 MG tablet Take 1 tablet (8 mg total) by mouth daily as needed for nausea or vomiting. 30 tablet 0   Oxycodone  HCl 10 MG TABS Take 1 tablet (10 mg total) by mouth every 6 (six) hours as needed (for pain). 90 tablet 0   OXYGEN Inhale 2 L into the lungs at bedtime. Or as needed throughout the day if short of breath     pantoprazole  (PROTONIX ) 40 MG tablet Take 1 tablet (40 mg total) by mouth daily. 30 tablet 0   polyethylene glycol (MIRALAX  / GLYCOLAX ) 17 g packet Take 17 g by mouth daily as needed for mild constipation or moderate constipation. 30 each 0   potassium chloride  SA (KLOR-CON ) 20 MEQ tablet Take 20 mEq by mouth 2 (two) times daily.     pregabalin  (LYRICA ) 150 MG capsule Take 1 capsule (150 mg total) by mouth 3 (three) times daily. 90 capsule 2   prochlorperazine  (COMPAZINE ) 10 MG tablet Take 1 tablet (10 mg total) by mouth every 6 (six) hours as needed for nausea or vomiting. (Patient taking differently: Take 10 mg by mouth daily as needed for nausea or vomiting.) 30 tablet 1   senna-docusate (SENOKOT-S) 8.6-50 MG tablet Take 1 tablet by mouth 2 (two) times daily. For constipation 60 tablet 0   sodium chloride  flush 0.9 % SOLN injection Use 10 mLs by Intracatheter route daily as directed for 60 doses. 300 mL 1   spironolactone  (ALDACTONE ) 25 MG tablet TAKE 1 TABLET(25 MG) BY MOUTH EVERY MORNING 90 tablet 3   zolpidem (AMBIEN) 10 MG tablet Take 5-10 mg by mouth at bedtime.     No current facility-administered medications for this visit.    VITAL SIGNS: BP (!) 145/70 (BP Location: Left Arm, Patient Position: Sitting)   Pulse (!) 103   Temp (!) 97.5 F (36.4 C) (Temporal)   Resp 18   Wt 157 lb 6.4 oz (71.4 kg)   LMP 03/13/2015 (Approximate) Comment: more than 2 years ago  SpO2 97%   BMI 23.93  kg/m  Filed Weights   12/07/23 1126  Weight: 157 lb 6.4 oz (71.4 kg)    Estimated body mass index is 23.93 kg/m as calculated from the following:   Height as of 11/23/23: 5' 8 (1.727 m).   Weight as of this encounter: 157 lb 6.4 oz (71.4 kg).   PERFORMANCE STATUS (ECOG) : 1 - Symptomatic but completely ambulatory  Physical Exam General: NAD Cardiovascular: regular rate and rhythm Pulmonary: normal breathing pattern Extremities: no edema, no joint deformities Skin: no rashes, right biliary drain. Neurological: AAO  x3  IMPRESSION: Discussed the use of AI scribe software for clinical note transcription with the patient, who gave verbal consent to proceed.  History of Present Illness Erica [name] is a 58 year old female who presents for follow-up s/p recent hospitalization due to SIRS with acute cholecystitis.  Patient discharged with drain in place.  Denies concerns of nausea, vomiting, constipation, or diarrhea.  She is managing a biliary drain, which outputs approximately 425 to 375 mL daily. She notes that the output was lower on one occasion, possibly due to her sleeping position. She describes an incident where her dog jumped on her, causing concern about the drain.   She reports an increased appetite since being discharged from the hospital, where she found the food unpalatable.  Weight is stable at 157 pounds  Ms. Rumbaugh reports her pain from the gallbladder issue is gone, but she is out of oxycodone , which she was taking for pain management. She received a few pills upon discharge from the hospital but has since run out. She is taking pregabalin  three times a day, oxycodone  10 mg every 6 hours as needed, and tizanidine  for muscle relaxation.  No adjustments to current regimen at this time.  Patient is taking medications appropriately.  Refill sent to requested pharmacy.  All questions answered.  We will continue to closely monitor.   I discussed the importance of continued  conversation with family and their medical providers regarding overall plan of care and treatment options, ensuring decisions are within the context of the patients values and GOCs. Assessment & Plan Cancer Related Pain management Chronic pain persists, managed with oxycodone  and Flexeril . Lyrica  at maximum dose.  No adjustments to current regimen. - Send prescriptions for oxycodone  and Flexeril  to [facility] in [place]. - Continue Lyrica  150 mg three times a day. -Continue oxycodone  10 mg every 8 hours as needed. - Flexeril  10 mg 3 times daily as needed  Gallbladder issues Gallbladder surgery considered but deferred due to cardiac and pulmonary risks. - Discuss gallbladder surgery options with Dr. Valencia Gary in July.  Follow-up Follow-up scheduled to finalize gallbladder surgery decision and manage port maintenance.  I will plan to see patient back after her port flushes in June and July. - Attend follow-up appointment in July to discuss gallbladder surgery. - Attend port flush appointment on June 19th. - Attend port flush appointment on July 31st. - Ensure pharmacy prescriptions are filled and check for any issues with prior authorization.  Patient expressed understanding and was in agreement with this plan. She also understands that She can call the clinic at any time with any questions, concerns, or complaints.   Any controlled substances utilized were prescribed in the context of palliative care. PDMP has been reviewed.   Visit consisted of counseling and education dealing with the complex and emotionally intense issues of symptom management and palliative care in the setting of serious and potentially life-threatening illness.  Dellia Ferguson, AGPCNP-BC  Palliative Medicine Team/[facility]

## 2023-12-21 ENCOUNTER — Encounter: Payer: Self-pay | Admitting: Emergency Medicine

## 2023-12-27 ENCOUNTER — Other Ambulatory Visit (HOSPITAL_COMMUNITY): Payer: Self-pay

## 2023-12-27 ENCOUNTER — Encounter: Payer: Self-pay | Admitting: Hematology

## 2024-01-03 ENCOUNTER — Other Ambulatory Visit (HOSPITAL_COMMUNITY): Payer: Self-pay

## 2024-01-05 ENCOUNTER — Inpatient Hospital Stay: Admitting: Nurse Practitioner

## 2024-01-05 ENCOUNTER — Inpatient Hospital Stay: Attending: Oncology

## 2024-01-05 ENCOUNTER — Encounter: Payer: Self-pay | Admitting: Nurse Practitioner

## 2024-01-05 VITALS — BP 135/71 | HR 82 | Temp 98.0°F | Resp 16 | Ht 68.0 in | Wt 162.5 lb

## 2024-01-05 DIAGNOSIS — I110 Hypertensive heart disease with heart failure: Secondary | ICD-10-CM | POA: Insufficient documentation

## 2024-01-05 DIAGNOSIS — Z08 Encounter for follow-up examination after completed treatment for malignant neoplasm: Secondary | ICD-10-CM | POA: Insufficient documentation

## 2024-01-05 DIAGNOSIS — Z85048 Personal history of other malignant neoplasm of rectum, rectosigmoid junction, and anus: Secondary | ICD-10-CM | POA: Diagnosis present

## 2024-01-05 DIAGNOSIS — M1990 Unspecified osteoarthritis, unspecified site: Secondary | ICD-10-CM | POA: Insufficient documentation

## 2024-01-05 DIAGNOSIS — E119 Type 2 diabetes mellitus without complications: Secondary | ICD-10-CM | POA: Diagnosis not present

## 2024-01-05 DIAGNOSIS — G893 Neoplasm related pain (acute) (chronic): Secondary | ICD-10-CM

## 2024-01-05 DIAGNOSIS — J4489 Other specified chronic obstructive pulmonary disease: Secondary | ICD-10-CM | POA: Insufficient documentation

## 2024-01-05 DIAGNOSIS — C20 Malignant neoplasm of rectum: Secondary | ICD-10-CM

## 2024-01-05 DIAGNOSIS — R530 Neoplastic (malignant) related fatigue: Secondary | ICD-10-CM

## 2024-01-05 DIAGNOSIS — E785 Hyperlipidemia, unspecified: Secondary | ICD-10-CM | POA: Insufficient documentation

## 2024-01-05 DIAGNOSIS — M792 Neuralgia and neuritis, unspecified: Secondary | ICD-10-CM | POA: Diagnosis not present

## 2024-01-05 DIAGNOSIS — Z515 Encounter for palliative care: Secondary | ICD-10-CM

## 2024-01-05 DIAGNOSIS — Z95828 Presence of other vascular implants and grafts: Secondary | ICD-10-CM

## 2024-01-05 DIAGNOSIS — Z452 Encounter for adjustment and management of vascular access device: Secondary | ICD-10-CM | POA: Insufficient documentation

## 2024-01-05 LAB — CBC WITH DIFFERENTIAL/PLATELET
Abs Immature Granulocytes: 0.02 10*3/uL (ref 0.00–0.07)
Basophils Absolute: 0.1 10*3/uL (ref 0.0–0.1)
Basophils Relative: 1 %
Eosinophils Absolute: 0.2 10*3/uL (ref 0.0–0.5)
Eosinophils Relative: 4 %
HCT: 38 % (ref 36.0–46.0)
Hemoglobin: 12.9 g/dL (ref 12.0–15.0)
Immature Granulocytes: 0 %
Lymphocytes Relative: 12 %
Lymphs Abs: 0.7 10*3/uL (ref 0.7–4.0)
MCH: 29.6 pg (ref 26.0–34.0)
MCHC: 33.9 g/dL (ref 30.0–36.0)
MCV: 87.2 fL (ref 80.0–100.0)
Monocytes Absolute: 0.6 10*3/uL (ref 0.1–1.0)
Monocytes Relative: 12 %
Neutro Abs: 3.8 10*3/uL (ref 1.7–7.7)
Neutrophils Relative %: 71 %
Platelets: 233 10*3/uL (ref 150–400)
RBC: 4.36 MIL/uL (ref 3.87–5.11)
RDW: 15.7 % — ABNORMAL HIGH (ref 11.5–15.5)
WBC: 5.4 10*3/uL (ref 4.0–10.5)
nRBC: 0 % (ref 0.0–0.2)

## 2024-01-05 LAB — COMPREHENSIVE METABOLIC PANEL WITH GFR
ALT: 22 U/L (ref 0–44)
AST: 21 U/L (ref 15–41)
Albumin: 4.1 g/dL (ref 3.5–5.0)
Alkaline Phosphatase: 108 U/L (ref 38–126)
Anion gap: 9 (ref 5–15)
BUN: 14 mg/dL (ref 6–20)
CO2: 26 mmol/L (ref 22–32)
Calcium: 9.3 mg/dL (ref 8.9–10.3)
Chloride: 99 mmol/L (ref 98–111)
Creatinine, Ser: 0.85 mg/dL (ref 0.44–1.00)
GFR, Estimated: >60 mL/min (ref >60)
Glucose, Bld: 105 mg/dL — ABNORMAL HIGH (ref 70–99)
Potassium: 3.1 mmol/L — ABNORMAL LOW (ref 3.5–5.1)
Sodium: 134 mmol/L — ABNORMAL LOW (ref 135–145)
Total Bilirubin: 0.6 mg/dL (ref 0.0–1.2)
Total Protein: 7.3 g/dL (ref 6.5–8.1)

## 2024-01-05 LAB — CEA (ACCESS): CEA (CHCC): 6.65 ng/mL — ABNORMAL HIGH (ref 0.00–5.00)

## 2024-01-05 LAB — GENETIC SCREENING ORDER

## 2024-01-05 MED ORDER — HEPARIN SOD (PORK) LOCK FLUSH 100 UNIT/ML IV SOLN
500.0000 [IU] | Freq: Once | INTRAVENOUS | Status: AC
  Administered 2024-01-05: 500 [IU]

## 2024-01-05 MED ORDER — PREGABALIN 150 MG PO CAPS: 150.0000 mg | ORAL_CAPSULE | Freq: Three times a day (TID) | ORAL | 2 refills | Status: DC

## 2024-01-05 MED ORDER — OXYCODONE HCL 15 MG PO TABS: 15.0000 mg | ORAL_TABLET | Freq: Four times a day (QID) | ORAL | 0 refills | Status: DC | PRN

## 2024-01-05 MED ORDER — SODIUM CHLORIDE 0.9% FLUSH
10.0000 mL | Freq: Once | INTRAVENOUS | Status: AC
  Administered 2024-01-05: 10 mL

## 2024-01-05 NOTE — Progress Notes (Signed)
 Palliative Medicine [facility]  Telephone:(336) [phone] Fax:(336) [phone]   Name: [name] Date: 01/05/2024 MRN: [id number]  DOB: February 11, 1966  Patient Care Team: System, Provider Not In as PCP - General Gollan, Deadra Everts, MD as PCP - Cardiology (Cardiology) Charlette Console, FNP as Nurse Practitioner (Family Medicine) Devorah Fonder, MD as Consulting Physician (Cardiology) Valiant Gaul, MD (Inactive) as Consulting Physician (Pulmonary Disease) Burnie Cartwright, RN as Registered Nurse Arlette Benders, RN (Inactive) as Registered Nurse Rochell Chroman, RN as Oncology Nurse Navigator Sonja [place]ose, MD as Consulting Physician (Oncology) Pickenpack-Cousar, Giles Labrum, NP as Nurse Practitioner (Hospice and Palliative Medicine) Johna Myers, MD as Consulting Physician (Radiation Oncology) Luke Salaam, MD as Consulting Physician (Gastroenterology) Vergia Glasgow, MD as Consulting Physician (Pulmonary Disease)    INTERVAL HISTORY: Erica [name] is a 58 y.o. 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 Palliative ask to see for symptom management and goals of care.   SOCIAL HISTORY:     reports that she has been smoking cigarettes. She has a 138 pack-year smoking history. She has never used smokeless tobacco. She reports current alcohol use. She reports that she does not use drugs.  ADVANCE DIRECTIVES:  None on file   CODE STATUS: Full code  PAST MEDICAL HISTORY: Past Medical History:  Diagnosis Date   (HFpEF) heart failure with preserved ejection fraction (HCC)    a. 05/2015 Echo: EF 60-65%, no rwma, PASP .   Acute pancreatitis 08/13/2018   Anxiety    Arthritis    Asthma    Bell's palsy    no deficit   Bronchitis    Cancer (HCC) 12/2022   rectal cancer   CHF (congestive heart failure) (HCC)    COPD (chronic obstructive pulmonary  disease) (HCC)    Depression    Diabetes mellitus without complication (HCC)    type II 03/2017   Dyspnea    Fatty liver    per patient   Gall stones    per patient   Gastric ulcer    Hyperlipidemia    Hypertension    Lung nodule 12/2022   upper left   OSA (obstructive sleep apnea)    a. did not tolerate CPAP. On oxygen 2L via [place]   Pancreatitis    07/2018   Shoulder injury    6/19   Smoker     ALLERGIES:  is allergic to tramadol and penicillins.  MEDICATIONS:  Current Outpatient Medications  Medication Sig Dispense Refill   oxyCODONE  (ROXICODONE ) 15 MG immediate release tablet Take 1 tablet (15 mg total) by mouth every 6 (six) hours as needed for pain. 90 tablet 0   albuterol  (VENTOLIN  HFA) 108 (90 Base) MCG/ACT inhaler Inhale 2 puffs into the lungs every 6 (six) hours as needed for wheezing or shortness of breath.      busPIRone  (BUSPAR ) 30 MG tablet Take 30 mg by mouth 2 (two) times daily. PER PT STATES HER DOSE WAS INCREASED     clonazePAM  (KLONOPIN ) 0.5 MG tablet Take 0.5 mg by mouth 2 (two) times daily.     cyclobenzaprine  (FLEXERIL ) 10 MG tablet Take 1 tablet (10 mg total) by mouth 3 (three) times daily as needed for muscle spasms. (Patient taking differently: Take 10 mg by mouth 3 (three) times daily.) 90 tablet 2   diltiazem  (CARDIZEM  CD) 120 MG 24 hr capsule TAKE 1 CAPSULE BY  MOUTH EVERY DAY 180 capsule 2   diltiazem  (CARDIZEM ) 30 MG tablet TAKE 1 TABLET(30 MG) BY MOUTH THREE TIMES DAILY AS NEEDED (Patient taking differently: Take 30 mg by mouth 2 (two) times daily.) 90 tablet 3   DULoxetine  (CYMBALTA ) 60 MG capsule Take 60 mg by mouth 2 (two) times daily.      Fluticasone -Salmeterol (ADVAIR) 250-50 MCG/DOSE AEPB Inhale 1 puff into the lungs 2 (two) times daily.     furosemide  (LASIX ) 40 MG tablet TAKE 1 TABLET BY MOUTH EVERY DAY. MAY TAKE ADDITIONAL TABLET IN THE AM FOR WEIGHT GAIN AS NEEDED (Patient taking differently: Take 40 mg by mouth daily.) 110 tablet 2    JARDIANCE 25 MG TABS tablet Take 25 mg by mouth daily.     magic mouthwash (nystatin , lidocaine , diphenhydrAMINE, alum & mag hydroxide) suspension Take 5 mLs by mouth 4 (four) times daily as needed for mouth pain. 140 mL 1   metFORMIN  (GLUCOPHAGE -XR) 500 MG 24 hr tablet Take 1,000 mg by mouth 2 (two) times daily.     ondansetron  (ZOFRAN ) 8 MG tablet Take 1 tablet (8 mg total) by mouth daily as needed for nausea or vomiting. 30 tablet 0   OXYGEN Inhale 2 L into the lungs at bedtime. Or as needed throughout the day if short of breath     pantoprazole  (PROTONIX ) 40 MG tablet Take 1 tablet (40 mg total) by mouth daily. 30 tablet 0   polyethylene glycol (MIRALAX  / GLYCOLAX ) 17 g packet Take 17 g by mouth daily as needed for mild constipation or moderate constipation. 30 each 0   potassium chloride  SA (KLOR-CON ) 20 MEQ tablet Take 20 mEq by mouth 2 (two) times daily.     pregabalin  (LYRICA ) 150 MG capsule Take 1 capsule (150 mg total) by mouth 3 (three) times daily. 90 capsule 2   prochlorperazine  (COMPAZINE ) 10 MG tablet Take 1 tablet (10 mg total) by mouth every 6 (six) hours as needed for nausea or vomiting. (Patient taking differently: Take 10 mg by mouth daily as needed for nausea or vomiting.) 30 tablet 1   senna-docusate (SENOKOT-S) 8.6-50 MG tablet Take 1 tablet by mouth 2 (two) times daily. For constipation 60 tablet 0   sodium chloride  flush 0.9 % SOLN injection Use 10 mLs by Intracatheter route daily as directed for 60 doses. 300 mL 1   spironolactone  (ALDACTONE ) 25 MG tablet TAKE 1 TABLET(25 MG) BY MOUTH EVERY MORNING 90 tablet 3   zolpidem (AMBIEN) 10 MG tablet Take 5-10 mg by mouth at bedtime.     No current facility-administered medications for this visit.    VITAL SIGNS: BP 135/71 (BP Location: Left Arm, Patient Position: Sitting)   Pulse 82   Temp 98 F (36.7 C) (Oral)   Resp 16   Ht 5' 8 (1.727 m)   Wt 162 lb 8 oz (73.7 kg)   LMP 03/13/2015 (Approximate) Comment: more than 2  years ago  SpO2 100%   BMI 24.71 kg/m  Filed Weights   01/05/24 1440  Weight: 162 lb 8 oz (73.7 kg)    Estimated body mass index is 24.71 kg/m as calculated from the following:   Height as of this encounter: 5' 8 (1.727 m).   Weight as of this encounter: 162 lb 8 oz (73.7 kg).   PERFORMANCE STATUS (ECOG) : 1 - Symptomatic but completely ambulatory  Physical Exam General: NAD Cardiovascular: regular rate and rhythm Pulmonary: normal breathing pattern Extremities: no edema, no joint deformities Skin:  no rashes, right biliary drain. Neurological: AAO x3  IMPRESSION: Discussed the use of AI scribe software for clinical note transcription with the patient, who gave verbal consent to proceed.  History of Present Illness Erica [name] is a 58 year old female who presents to clinic for follow-up. Recent hospitalization due to SIRS with acute cholecystitis subsequently discharged home with percutaneous drain.  She is managing drain, which outputs approximately 425 to 375 mL daily. There is increased output from her drain, ranging from 300 to 600 mL per day over the past few days. She notes that the output was lower on one occasion, possibly due to her sleeping position. She describes an incident where her dog jumped on her, causing concern about the drain. However continues to drain without difficulty. No increase in pain at site. Persistent feelings of discomfort.   She experiences significant fatigue and lack of energy, feeling drained.  She is unsure about the significance of the drainage volume.  She has chronic pain and takes oxycodone  10 mg, which she feels is insufficient for pain management. There is a decrease in her pain tolerance, possibly due to previous treatments, and ongoing pain despite medication. She also takes Lyrica  three times a day and Ambien 10 mg at night, which provides limited relief for sleep disturbances. She has difficulty sleeping due to pain and discomfort,  often waking up shortly after falling asleep. Patient is complaining of increased pain and discomfort. Will increase oxycodone  to 15mg  every 6 hours as needed. We will continue to closely monitor.   She uses clonazepam  0.5 mg as needed for panic attacks, which have been severe and persistent over the last several years. She feels frustrated by the lack of control over her mind due to these panic attacks.  She faces significant social challenges, including housing instability. She currently resides in a camper near her daughter's house but needs to move soon due to issues with her daughter. She is unable to afford rent or find affordable housing options, as Section 8 housing has been shut down indefinitely. She regrets selling her house, which has led to her current housing predicament, initially selling it to travel with her grandchildren. She is concerned about the logistics of moving her belongings, which are currently stored at her daughter's place.  All questions answered and support provided.   I discussed the importance of continued conversation with family and their medical providers regarding overall plan of care and treatment options, ensuring decisions are within the context of the patients values and GOCs. Assessment & Plan Cancer Related Pain management Pain inadequately managed with current regimen. Increased oxycodone  to 15 mg for better control. - Increase oxycodone  to 15 mg. - Send new prescription to [facility] in [place]. - Instruct her to call the office for prescription issues or prior authorization. - Continue Lyrica  150 mg three times a day. - Flexeril  10 mg 3 times daily as needed  Anxiety with panic attacks Chronic anxiety with panic attacks managed with clonazepam  0.5 mg as needed.  Housing instability and financial difficulties Housing instability and financial difficulties affecting well-being. - Contact social workers for housing resources and support. -  Instruct her to bring [employer] paperwork for processing.  Follow-up Follow-up scheduled to finalize gallbladder surgery decision and manage port maintenance.  I will plan to see patient back after her port flushes in June and July. - Attend port flush appointment on July 31st. - Ensure pharmacy prescriptions are filled and check for any issues  with prior authorization.  Patient expressed understanding and was in agreement with this plan. She also understands that She can call the clinic at any time with any questions, concerns, or complaints.   Any controlled substances utilized were prescribed in the context of palliative care. PDMP has been reviewed.   Visit consisted of counseling and education dealing with the complex and emotionally intense issues of symptom management and palliative care in the setting of serious and potentially life-threatening illness.  Dellia Ferguson, AGPCNP-BC  Palliative Medicine Team/[facility]

## 2024-01-09 ENCOUNTER — Encounter: Payer: Self-pay | Admitting: Hematology

## 2024-01-16 ENCOUNTER — Other Ambulatory Visit: Payer: Self-pay

## 2024-01-16 ENCOUNTER — Telehealth: Payer: Self-pay

## 2024-01-16 DIAGNOSIS — C20 Malignant neoplasm of rectum: Secondary | ICD-10-CM

## 2024-01-16 NOTE — Telephone Encounter (Signed)
 Pt called regarding CT Scan and when Signatera will be drawn.  Reviewed Dr. Demetra last office note.  Stated based on Dr. Demetra last office note the pt is to have a CT CAP w/contrast 1 wk before next office visit which is currently schedule 02/23/2024 and Signatera w/in 6 wks which is scheduled on 02/16/2024 when pt comes in to see Palliative Care.  Provided pt with the telephone number for [facility] so she could contact them to schedule her CT Scan.  Pt verbalized understanding and stated she will contact Central Scheduling to schedule her CT Scan.  Pt confirmed appts with this nurse via telephone regarding lab appt, Palliative Care appt, and f/u appt with Dr. Lanny to review scan results.

## 2024-01-17 ENCOUNTER — Ambulatory Visit: Admitting: Podiatry

## 2024-01-19 ENCOUNTER — Other Ambulatory Visit (HOSPITAL_COMMUNITY): Payer: Self-pay | Admitting: Urology

## 2024-01-19 ENCOUNTER — Encounter (HOSPITAL_COMMUNITY): Payer: Self-pay | Admitting: Urology

## 2024-01-19 ENCOUNTER — Other Ambulatory Visit (HOSPITAL_COMMUNITY): Payer: Self-pay | Admitting: Interventional Radiology

## 2024-01-19 ENCOUNTER — Ambulatory Visit (HOSPITAL_COMMUNITY)
Admission: RE | Admit: 2024-01-19 | Discharge: 2024-01-19 | Disposition: A | Discharge status: Home / Self Care | Source: Ambulatory Visit | Attending: Urology | Admitting: Urology

## 2024-01-19 DIAGNOSIS — K810 Acute cholecystitis: Secondary | ICD-10-CM

## 2024-01-19 DIAGNOSIS — Z515 Encounter for palliative care: Secondary | ICD-10-CM

## 2024-01-19 DIAGNOSIS — G893 Neoplasm related pain (acute) (chronic): Secondary | ICD-10-CM

## 2024-01-19 DIAGNOSIS — Z434 Encounter for attention to other artificial openings of digestive tract: Secondary | ICD-10-CM | POA: Diagnosis present

## 2024-01-19 DIAGNOSIS — C20 Malignant neoplasm of rectum: Secondary | ICD-10-CM

## 2024-01-19 DIAGNOSIS — E871 Hypo-osmolality and hyponatremia: Secondary | ICD-10-CM

## 2024-01-19 HISTORY — PX: IR EXCHANGE BILIARY DRAIN: IMG6046

## 2024-01-19 MED ORDER — LIDOCAINE HCL 1 % IJ SOLN
INTRAMUSCULAR | Status: AC
Start: 2024-01-19 — End: 2024-01-19
  Filled 2024-01-19: qty 20

## 2024-01-19 MED ORDER — LIDOCAINE HCL 1 % IJ SOLN
20.0000 mL | Freq: Once | INTRAMUSCULAR | Status: AC
  Administered 2024-01-19: 1 mL via INTRADERMAL

## 2024-01-19 MED ORDER — IOHEXOL 300 MG/ML  SOLN
50.0000 mL | Freq: Once | INTRAMUSCULAR | Status: AC | PRN
  Administered 2024-01-19: 10 mL

## 2024-01-26 ENCOUNTER — Other Ambulatory Visit (HOSPITAL_COMMUNITY)

## 2024-01-26 ENCOUNTER — Ambulatory Visit: Admitting: Student in an Organized Health Care Education/Training Program

## 2024-01-26 LAB — SIGNATERA
SIGNATERA MTM READOUT: 0 MTM/ml
SIGNATERA TEST RESULT: NEGATIVE

## 2024-02-02 ENCOUNTER — Ambulatory Visit (HOSPITAL_COMMUNITY)
Admission: RE | Admit: 2024-02-02 | Discharge: 2024-02-02 | Disposition: A | Discharge status: Home / Self Care | Source: Ambulatory Visit | Attending: Interventional Radiology | Admitting: Interventional Radiology

## 2024-02-02 DIAGNOSIS — K810 Acute cholecystitis: Secondary | ICD-10-CM | POA: Insufficient documentation

## 2024-02-02 HISTORY — PX: IR EXCHANGE BILIARY DRAIN: IMG6046

## 2024-02-02 HISTORY — PX: IR REMOVAL BILIARY DRAIN: IMG6047

## 2024-02-02 HISTORY — PX: IR CHOLANGIOGRAM EXISTING TUBE: IMG6040

## 2024-02-02 MED ORDER — IOHEXOL 300 MG/ML  SOLN
50.0000 mL | Freq: Once | INTRAMUSCULAR | Status: AC | PRN
  Administered 2024-02-02: 20 mL

## 2024-02-02 NOTE — Procedures (Signed)
 Interventional Radiology Procedure:   Indications: Follow up cholecystostomy tube capping trial  Procedure: Cholangiogram through cholecystostomy tube  Findings: Patient was asymptomatic with capping trial.  Cystic duct and CBD are patent.  Cholecystostomy tube was removed.  Complications: None     EBL: Minimal  Plan: Contact us  or primary provider if she has pain, fever or chills after removal.  Vaneta Hammontree R. Philip, MD  Pager: [phone]

## 2024-02-03 ENCOUNTER — Encounter (HOSPITAL_COMMUNITY): Payer: Self-pay | Admitting: Radiology

## 2024-02-03 ENCOUNTER — Other Ambulatory Visit: Payer: Self-pay

## 2024-02-03 ENCOUNTER — Other Ambulatory Visit (HOSPITAL_COMMUNITY): Payer: Self-pay | Admitting: Urology

## 2024-02-03 ENCOUNTER — Telehealth: Payer: Self-pay

## 2024-02-03 ENCOUNTER — Other Ambulatory Visit: Payer: Self-pay | Admitting: Nurse Practitioner

## 2024-02-03 DIAGNOSIS — C20 Malignant neoplasm of rectum: Secondary | ICD-10-CM

## 2024-02-03 DIAGNOSIS — G893 Neoplasm related pain (acute) (chronic): Secondary | ICD-10-CM

## 2024-02-03 DIAGNOSIS — K810 Acute cholecystitis: Secondary | ICD-10-CM

## 2024-02-03 DIAGNOSIS — Z515 Encounter for palliative care: Secondary | ICD-10-CM

## 2024-02-03 MED ORDER — OXYCODONE HCL 15 MG PO TABS: 15.0000 mg | ORAL_TABLET | Freq: Four times a day (QID) | ORAL | 0 refills | Status: DC | PRN

## 2024-02-03 NOTE — Telephone Encounter (Signed)
 Received telephone call from the patient requesting a refill on her Oxycodone . Confirmed [facility] [facility], [place], civil (consulting). Let patient know that I would forward her message request to [facility]. Patient voiced understanding.

## 2024-02-15 ENCOUNTER — Ambulatory Visit: Admitting: Student in an Organized Health Care Education/Training Program

## 2024-02-16 ENCOUNTER — Inpatient Hospital Stay: Attending: Oncology

## 2024-02-16 ENCOUNTER — Other Ambulatory Visit: Payer: Self-pay | Admitting: Cardiovascular Disease

## 2024-02-16 ENCOUNTER — Inpatient Hospital Stay: Admitting: Nurse Practitioner

## 2024-02-16 ENCOUNTER — Ambulatory Visit (HOSPITAL_COMMUNITY): Admission: RE | Admit: 2024-02-16 | Source: Ambulatory Visit

## 2024-02-16 ENCOUNTER — Other Ambulatory Visit: Payer: Self-pay | Admitting: Nurse Practitioner

## 2024-02-16 DIAGNOSIS — Z515 Encounter for palliative care: Secondary | ICD-10-CM

## 2024-02-16 DIAGNOSIS — C20 Malignant neoplasm of rectum: Secondary | ICD-10-CM

## 2024-02-16 MED ORDER — CYCLOBENZAPRINE HCL 10 MG PO TABS: 10.0000 mg | ORAL_TABLET | Freq: Three times a day (TID) | ORAL | 2 refills | Status: DC | PRN

## 2024-02-17 ENCOUNTER — Encounter: Payer: Self-pay | Admitting: Hematology

## 2024-02-20 NOTE — Progress Notes (Deleted)
 NO SHOW

## 2024-02-21 ENCOUNTER — Ambulatory Visit: Attending: Cardiovascular Disease | Admitting: Cardiovascular Disease

## 2024-02-21 DIAGNOSIS — R000 Tachycardia, unspecified: Secondary | ICD-10-CM

## 2024-02-21 DIAGNOSIS — I10 Essential (primary) hypertension: Secondary | ICD-10-CM

## 2024-02-21 DIAGNOSIS — J209 Acute bronchitis, unspecified: Secondary | ICD-10-CM

## 2024-02-21 DIAGNOSIS — F17200 Nicotine dependence, unspecified, uncomplicated: Secondary | ICD-10-CM

## 2024-02-21 DIAGNOSIS — I5032 Chronic diastolic (congestive) heart failure: Secondary | ICD-10-CM

## 2024-02-21 DIAGNOSIS — I272 Pulmonary hypertension, unspecified: Secondary | ICD-10-CM

## 2024-02-22 NOTE — Assessment & Plan Note (Deleted)
 [id number], stage IV,with oligo lung metastasis, intact MMR, KRAS G12V (+) -We reviewed her medical record and workup in detail with the patient and her daughter.  She presented with hematochezia since 05/2022 -Workup showed a near obstructing rectal mass. Path confirmed adenocarcinoma. Baseline CEA elevated to 18.1.  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  I added bevacizumab  to her chemo from cycle 3 -restaging CT 11/9 showed good PR, she completed a total of 8 cycle chemo then started concurrent chemoRT to rectal cancer on 07/25/2023 and completed on 09/23/2023 -CT CAP w contrast on 11/01/2023 showed rectal wall thickening but otherwise no evidence of residual diease

## 2024-02-23 ENCOUNTER — Inpatient Hospital Stay: Admitting: Hematology

## 2024-02-23 ENCOUNTER — Telehealth: Payer: Self-pay

## 2024-02-23 ENCOUNTER — Telehealth: Payer: Self-pay | Admitting: Hematology

## 2024-02-23 ENCOUNTER — Inpatient Hospital Stay: Admitting: Nurse Practitioner

## 2024-02-23 DIAGNOSIS — C20 Malignant neoplasm of rectum: Secondary | ICD-10-CM

## 2024-02-23 NOTE — Telephone Encounter (Signed)
 Canceled appointment per patients request via incoming call. Talked with the patient and she is aware of the canceled appointment.

## 2024-02-23 NOTE — Telephone Encounter (Signed)
 Tried calling pt and was unable to reach pt nor leave a voicemail.  LVM on pt's daughter Linda's phone stating that Dr. Lanny would like to cancel the pt's appts for today d/t pt was ordered a CT Scan to be completed at the beginning of August of which the pt has not done.  Stated why the CT Scan was needed.  Provided the telephone number to Central Scheduling for the pt to call to schedule the CT Scan.  Stated again that the appts scheduled for today 02/23/2024 will be cancelled.  Provided telephone number to Dr. Demetra office should the pt had additional questions or concerns.  Cancelled appts in EPIC.

## 2024-02-29 ENCOUNTER — Other Ambulatory Visit: Payer: Self-pay

## 2024-02-29 ENCOUNTER — Emergency Department (HOSPITAL_COMMUNITY)

## 2024-02-29 ENCOUNTER — Emergency Department (HOSPITAL_COMMUNITY)
Admission: EM | Admit: 2024-02-29 | Discharge: 2024-02-29 | Disposition: A | Discharge status: Home / Self Care | Attending: Emergency Medicine | Admitting: Emergency Medicine

## 2024-02-29 DIAGNOSIS — S42031A Displaced fracture of lateral end of right clavicle, initial encounter for closed fracture: Secondary | ICD-10-CM | POA: Diagnosis not present

## 2024-02-29 DIAGNOSIS — S0001XA Abrasion of scalp, initial encounter: Secondary | ICD-10-CM | POA: Insufficient documentation

## 2024-02-29 DIAGNOSIS — Z85118 Personal history of other malignant neoplasm of bronchus and lung: Secondary | ICD-10-CM | POA: Insufficient documentation

## 2024-02-29 DIAGNOSIS — R519 Headache, unspecified: Secondary | ICD-10-CM | POA: Diagnosis not present

## 2024-02-29 DIAGNOSIS — W01198A Fall on same level from slipping, tripping and stumbling with subsequent striking against other object, initial encounter: Secondary | ICD-10-CM | POA: Diagnosis not present

## 2024-02-29 DIAGNOSIS — S4991XA Unspecified injury of right shoulder and upper arm, initial encounter: Secondary | ICD-10-CM | POA: Diagnosis present

## 2024-02-29 DIAGNOSIS — Z85038 Personal history of other malignant neoplasm of large intestine: Secondary | ICD-10-CM | POA: Diagnosis not present

## 2024-02-29 MED ORDER — OXYCODONE-ACETAMINOPHEN 5-325 MG PO TABS: 1.0000 | ORAL_TABLET | Freq: Four times a day (QID) | ORAL | 0 refills | Status: DC | PRN

## 2024-02-29 NOTE — ED Provider Notes (Signed)
 Erica [name] EMERGENCY DEPARTMENT AT [facility] Provider Note   CSN: [id number] Arrival date & time: 02/29/24  1753     Patient presents with: Erica [name]   Erica [name] is a 58 y.o. female.    Fall   This is a 58 year old female presenting with a fall that occurred several days ago when she was tripped by her dog's leash in the yard, she fell and struck a cement pad with her right shoulder as well as bumped the back of her head, she had no loss of consciousness but has had some ongoing pain, and excessive bruising around the right shoulder.  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    Prior to Admission medications   Medication Sig Start Date End Date Taking? Authorizing Provider  oxyCODONE -acetaminophen  (PERCOCET/ROXICET) 5-325 MG tablet Take 1 tablet by mouth every 6 (six) hours as needed for severe pain (pain score 7-10). 02/29/24  Yes Cleotilde Rogue, MD  Accu-Chek FastClix Lancets MISC Apply topically 2 (two) times daily. 01/17/24   [provider]  ACCU-CHEK GUIDE TEST test strip 2 (two) times daily. 01/16/24   [provider]  albuterol  (VENTOLIN  HFA) 108 (90 Base) MCG/ACT inhaler Inhale 2 puffs into the lungs every 6 (six) hours as needed for wheezing or shortness of breath.     [provider]  busPIRone  (BUSPAR ) 30 MG tablet Take 30 mg by mouth 2 (two) times daily. PER PT STATES HER DOSE WAS INCREASED 02/14/20   [provider]  clonazePAM  (KLONOPIN ) 0.5 MG tablet Take 0.5 mg by mouth 2 (two) times daily. 03/12/20   [provider]  cyclobenzaprine  (FLEXERIL ) 10 MG tablet Take 1 tablet (10 mg total) by mouth 3 (three) times daily as needed for muscle spasms. 02/16/24   Pickenpack-Cousar, Athena N, NP  diltiazem  (CARDIZEM  CD) 120 MG 24 hr capsule TAKE 1 CAPSULE BY MOUTH EVERY DAY 04/22/23   Gollan, Timothy J, MD   diltiazem  (CARDIZEM ) 30 MG tablet TAKE 1 TABLET(30 MG) BY MOUTH THREE TIMES DAILY AS NEEDED 02/16/24   Gollan, Timothy J, MD  DULoxetine  (CYMBALTA ) 60 MG capsule Take 60 mg by mouth 2 (two) times daily.     [provider]  Fluticasone -Salmeterol (ADVAIR) 250-50 MCG/DOSE AEPB Inhale 1 puff into the lungs 2 (two) times daily.    [provider]  furosemide  (LASIX ) 40 MG tablet TAKE 1 TABLET BY MOUTH EVERY DAY. MAY TAKE ADDITIONAL TABLET IN THE AM FOR WEIGHT GAIN AS NEEDED Patient taking differently: Take 40 mg by mouth daily. 03/10/23   Gollan, Timothy J, MD  JARDIANCE 25 MG TABS tablet Take 25 mg by mouth daily. 08/18/21   [provider]  levofloxacin  (LEVAQUIN ) 500 MG tablet Take 500 mg by mouth daily. 12/08/23   [provider]  magic mouthwash (nystatin , lidocaine , diphenhydrAMINE, alum & mag hydroxide) suspension Take 5 mLs by mouth 4 (four) times daily as needed for mouth pain. 04/14/23   Lanny Callander, MD  metFORMIN  (GLUCOPHAGE -XR) 500 MG 24 hr tablet Take 1,000 mg by mouth 2 (two) times daily. 10/21/22   [provider]  ondansetron  (ZOFRAN ) 8 MG tablet Take 1 tablet (8 mg total) by mouth daily as needed for nausea or vomiting. 11/27/23   Rai, Ripudeep K, MD  oxyCODONE  (ROXICODONE ) 15 MG immediate release tablet Take 1 tablet (15 mg total) by mouth every 6 (  six) hours as needed for pain. 02/03/24   Pickenpack-Cousar, Athena N, NP  OXYGEN Inhale 2 L into the lungs at bedtime. Or as needed throughout the day if short of breath    [provider]  pantoprazole  (PROTONIX ) 40 MG tablet Take 1 tablet (40 mg total) by mouth daily. 05/04/18   Delores Raford SAILOR, MD  polyethylene glycol (MIRALAX  / GLYCOLAX ) 17 g packet Take 17 g by mouth daily as needed for mild constipation or moderate constipation. 11/27/23   Rai, Nydia POUR, MD  potassium chloride  SA (KLOR-CON ) 20 MEQ tablet Take 20 mEq by mouth 2 (two) times daily. 02/15/20   [provider]   pregabalin  (LYRICA ) 150 MG capsule Take 1 capsule (150 mg total) by mouth 3 (three) times daily. 01/05/24   Pickenpack-Cousar, Athena N, NP  prochlorperazine  (COMPAZINE ) 10 MG tablet Take 1 tablet (10 mg total) by mouth every 6 (six) hours as needed for nausea or vomiting. Patient taking differently: Take 10 mg by mouth daily as needed for nausea or vomiting. 02/17/23   Lanny Callander, MD  senna-docusate (SENOKOT-S) 8.6-50 MG tablet Take 1 tablet by mouth 2 (two) times daily. For constipation 11/27/23   Rai, Ripudeep K, MD  spironolactone  (ALDACTONE ) 25 MG tablet TAKE 1 TABLET(25 MG) BY MOUTH EVERY MORNING 05/23/23   Gollan, Timothy J, MD  zolpidem (AMBIEN) 10 MG tablet Take 5-10 mg by mouth at bedtime. 12/01/22   [provider]    Allergies: Tramadol and Penicillins    Review of Systems  All other systems reviewed and are negative.   Updated Vital Signs BP 138/86   Pulse 91   Temp 98.2 F (36.8 C) (Oral)   Resp 17   Ht 1.727 m (5' 8)   Wt 68 kg   LMP 03/13/2015 (Approximate) Comment: more than 2 years ago  SpO2 100%   BMI 22.81 kg/m   Physical Exam Vitals and nursing note reviewed.  Constitutional:      General: She is not in acute distress.    Appearance: She is well-developed.  HENT:     Head: Normocephalic.     Comments: Abrasion to the posterior scalp    Mouth/Throat:     Pharynx: No oropharyngeal exudate.  Eyes:     General: No scleral icterus.       Right eye: No discharge.        Left eye: No discharge.     Conjunctiva/sclera: Conjunctivae normal.     Pupils: Pupils are equal, round, and reactive to light.  Neck:     Thyroid: No thyromegaly.     Vascular: No JVD.  Cardiovascular:     Rate and Rhythm: Normal rate and regular rhythm.     Heart sounds: Normal heart sounds. No murmur heard.    No friction rub. No gallop.  Pulmonary:     Effort: Pulmonary effort is normal. No respiratory distress.     Breath sounds: Normal breath sounds. No wheezing or rales.   Abdominal:     General: Bowel sounds are normal. There is no distension.     Palpations: Abdomen is soft. There is no mass.     Tenderness: There is no abdominal tenderness.  Musculoskeletal:        General: Swelling, tenderness and signs of injury present. Normal range of motion.     Cervical back: Normal range of motion and neck supple.     Right lower leg: No edema.     Left lower leg:  No edema.     Comments: Although there is bruising extending from the posterior right shoulder around to the front onto the chest wall and down the arm there does not appear to be any decreased range of motion until she gets to about 90 degrees of abduction of the right shoulder.  Normal elbow wrist and grip on that side, normal pulses on that side.  The chest wall is not significantly tender and there is no tenderness over the clavicle, the port in the right chest is palpable and nontender without swelling around although there is bruising extending from the shoulder down across the chest  Lymphadenopathy:     Cervical: No cervical adenopathy.  Skin:    General: Skin is warm and dry.     Findings: No erythema or rash.  Neurological:     Mental Status: She is alert.     Coordination: Coordination normal.     Comments: Awake alert and follows commands without difficulty  Psychiatric:        Behavior: Behavior normal.     (all labs ordered are listed, but only abnormal results are displayed) Labs Reviewed - No data to display  EKG: None  Radiology: DG Chest 2 View Result Date: 02/29/2024 EXAM: 2 VIEW(S) XRAY OF THE CHEST 02/29/2024 06:52:55 PM COMPARISON: AP radiograph of the chest dated 01/27/2023. CLINICAL HISTORY: Trauma, - eval port in R chest. FINDINGS: LUNGS AND PLEURA: The lungs are clear. No focal pulmonary opacity. No pulmonary edema. No pleural effusion. No pneumothorax. HEART AND MEDIASTINUM: The heart is normal in size. No acute abnormality of the cardiac and mediastinal silhouettes.  BONES AND SOFT TISSUES: There is a slightly displaced fracture of the right lateral clavicle. Since the previous study, a right internal jugular chest port has been placed. IMPRESSION: 1. Slightly displaced fracture of the right lateral clavicle. 2. Right internal jugular chest port in appropriate position. Electronically signed by: evalene coho 02/29/2024 08:12 PM EDT RP Workstation: [id number]   DG Shoulder Right Result Date: 02/29/2024 EXAM: XRAY OF THE RIGHT SHOULDER 02/29/2024 06:52:55 PM COMPARISON: None available. CLINICAL HISTORY: Trauma. FINDINGS: BONES AND JOINTS: There is a slightly displaced thin angulated fracture of the distal right clavicle. The glenohumeral joint is unremarkable. The visualized ribs are intact. SOFT TISSUES: No abnormal calcifications. Visualized lung is unremarkable. IMPRESSION: 1. Slightly displaced and angulated fracture of the distal right clavicle. Electronically signed by: evalene coho 02/29/2024 08:11 PM EDT RP Workstation: [id number]   CT Head Wo Contrast Result Date: 02/29/2024 EXAM: CT HEAD WITHOUT CONTRAST 02/29/2024 06:38:48 PM TECHNIQUE: CT of the head was performed without the administration of intravenous contrast. Automated exposure control, iterative reconstruction, and/or weight based adjustment of the mA/kV was utilized to reduce the radiation dose to as low as reasonably achievable. COMPARISON: CT of the head dated 02/07/2008. CLINICAL HISTORY: Polytrauma, blunt. Pt fell from her dogs leash about 4 days ago. Pt feels she is not as alert and need to be seen. Pt is bruised on right shoulder, all chest area, right shoulder blade purple. Pt said she did hit her head but she is not on blood thinners. FINDINGS: BRAIN AND VENTRICLES: No acute hemorrhage. Gray-white differentiation is preserved. No hydrocephalus. No extra-axial collection. No mass effect or midline shift. ORBITS: No acute abnormality. SINUSES: The right maxillary sinus is nearly completely  opacified. The patient is status post bilateral antrostomies. SOFT TISSUES AND SKULL: No acute soft tissue abnormality. No skull fracture. IMPRESSION: 1. No acute intracranial abnormality. 2. Right  maxillary sinus nearly completely opacified, status post bilateral antrostomies. Electronically signed by: evalene coho 02/29/2024 08:10 PM EDT RP Workstation: [id number]     Procedures   Medications Ordered in the ED - No data to display                                  Medical Decision Making Amount and/or Complexity of Data Reviewed Radiology: ordered.  Risk Prescription drug management.   Fall status post significant bruising, needs imaging of the right shoulder and the head, vital signs unremarkable, neurologically intact.  Patient agreeable to the workup.  Will also obtain chest x-ray to make sure there is no damage to her port   Radiology Imaging: I personally viewed the images of the ordered radiographic studies and find fracture of the lateral end of the clavicle, no damage to the chest or the port or the shoulder or the head I agree with the radiologist interpretation as well  Meds / Interventions: while in the ED the patient received the following: Home with pain medication, sling placed  I have discussed with the patient at the bedside the results, and the meaning of these results.  They have had opportunity to ask questions,  expressed their understanding to the need for follow-up with primary care physician       Final diagnoses:  Closed displaced fracture of acromial end of right clavicle, initial encounter    ED Discharge Orders          Ordered    oxyCODONE -acetaminophen  (PERCOCET/ROXICET) 5-325 MG tablet  Every 6 hours PRN        02/29/24 2107               Cleotilde Rogue, MD 02/29/24 2108

## 2024-02-29 NOTE — ED Triage Notes (Signed)
 Pt fell from her dogs leash about 4 days ago. Pt feels she is not as alert and need to be seen. Pt  is bruised on right shoulder ,all chest  area , right shoulder nlade pruple. Pt said she did hit her head but she is not on blood thinners.

## 2024-02-29 NOTE — Discharge Instructions (Addendum)
 Wear the sling, you can follow-up with the orthopedist listed above , This will heal by itself but you do need to see them within the week  Opiate medications such as Percocet or Vicodin or morphine  are very strong pain medications that are also very addictive if they are taken for too long, even a single dose can predispose someone to become addicted so be very careful when taking this medication.  You should take the smallest amount possible to relieve your pain, these medications may cause constipation or nausea, please follow-up with your doctor if you are having the need for ongoing pain control despite these medications.  Additionally please be aware that these medications may cause sedation or sleepiness or alter judgment so you should not take this if you are driving a vehicle or operating heavy machinery or taking care of small children.  Thank you for allowing us  to treat you in the emergency department today.  After reviewing your examination and potential testing that was done it appears that you are safe to go home.  I would like for you to follow-up with your doctor within the next several days, have them obtain your records and follow-up with them to review all potential tests and results from your visit.  If you should develop severe or worsening symptoms return to the emergency department immediately

## 2024-03-06 ENCOUNTER — Inpatient Hospital Stay: Admitting: Nurse Practitioner

## 2024-03-06 ENCOUNTER — Encounter: Payer: Self-pay | Admitting: Nurse Practitioner

## 2024-03-07 ENCOUNTER — Encounter: Payer: Self-pay | Admitting: Nurse Practitioner

## 2024-03-07 ENCOUNTER — Inpatient Hospital Stay: Admitting: Nurse Practitioner

## 2024-03-07 ENCOUNTER — Inpatient Hospital Stay: Attending: Oncology | Admitting: Nurse Practitioner

## 2024-03-07 VITALS — BP 152/88 | HR 81 | Temp 98.2°F

## 2024-03-07 DIAGNOSIS — C20 Malignant neoplasm of rectum: Secondary | ICD-10-CM

## 2024-03-07 DIAGNOSIS — F32A Depression, unspecified: Secondary | ICD-10-CM

## 2024-03-07 DIAGNOSIS — F419 Anxiety disorder, unspecified: Secondary | ICD-10-CM

## 2024-03-07 DIAGNOSIS — G893 Neoplasm related pain (acute) (chronic): Secondary | ICD-10-CM

## 2024-03-07 DIAGNOSIS — R530 Neoplastic (malignant) related fatigue: Secondary | ICD-10-CM

## 2024-03-07 DIAGNOSIS — Z515 Encounter for palliative care: Secondary | ICD-10-CM

## 2024-03-07 MED ORDER — OXYCODONE HCL 15 MG PO TABS: 15.0000 mg | ORAL_TABLET | Freq: Four times a day (QID) | ORAL | 0 refills | Status: DC | PRN

## 2024-03-08 ENCOUNTER — Encounter: Payer: Self-pay | Admitting: Hematology

## 2024-03-08 ENCOUNTER — Encounter

## 2024-03-08 NOTE — Progress Notes (Signed)
 Palliative Medicine [facility]  [phone] [phone]   Name: Erica [name] Date: 03/08/2024 MRN: [id number]  DOB: [birth year]  Patient Care Team: Tobie Domino, MD as PCP - General (Family Medicine) Perla, Evalene PARAS, MD as PCP - Cardiology (Cardiology) Donette Ellouise DELENA, FNP as Nurse Practitioner (Family Medicine) Perla Evalene PARAS, MD as Consulting Physician (Cardiology) Linard Alm NOVAK, MD (Inactive) as Consulting Physician (Pulmonary Disease) Cindie Jesusa HERO, RN as Registered Nurse Dannielle Arlean FALCON, RN (Inactive) as Registered Nurse Maurie Rayfield BIRCH, RN as Oncology Nurse Navigator Lanny Callander, MD as Consulting Physician (Oncology) Pickenpack-Cousar, Fannie SAILOR, NP as Nurse Practitioner (Hospice and Palliative Medicine) Dewey Rush, MD as Consulting Physician (Radiation Oncology) Therisa Bi, MD as Consulting Physician (Gastroenterology) Isadora Hose, MD as Consulting Physician (Pulmonary Disease)    INTERVAL HISTORY: Erica [name] is a 58 y.o. 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 Palliative ask to see for symptom management and goals of care.   SOCIAL HISTORY:     reports that she has been smoking cigarettes. She has a 138 pack-year smoking history. She has never used smokeless tobacco. She reports current alcohol use. She reports that she does not use drugs.  ADVANCE DIRECTIVES:  None on file   CODE STATUS: Full code  PAST MEDICAL HISTORY: Past Medical History:  Diagnosis Date   (HFpEF) heart failure with preserved ejection fraction (HCC)    a. 05/2015 Echo: EF 60-65%, no rwma, PASP .   Acute pancreatitis 08/13/2018   Anxiety    Arthritis    Asthma    Bell's palsy    no deficit   Bronchitis    Cancer (HCC) 12/2022   rectal cancer   CHF (congestive heart failure) (HCC)    COPD (chronic obstructive  pulmonary disease) (HCC)    Depression    Diabetes mellitus without complication (HCC)    type II 03/2017   Dyspnea    Fatty liver    per patient   Gall stones    per patient   Gastric ulcer    Hyperlipidemia    Hypertension    Lung nodule 12/2022   upper left   OSA (obstructive sleep apnea)    a. did not tolerate CPAP. On oxygen 2L via [place]   Pancreatitis    07/2018   Shoulder injury    6/19   Smoker     ALLERGIES:  is allergic to tramadol and penicillins.  MEDICATIONS:  Current Outpatient Medications  Medication Sig Dispense Refill   Accu-Chek FastClix Lancets MISC Apply topically 2 (two) times daily.     ACCU-CHEK GUIDE TEST test strip 2 (two) times daily.     albuterol  (VENTOLIN  HFA) 108 (90 Base) MCG/ACT inhaler Inhale 2 puffs into the lungs every 6 (six) hours as needed for wheezing or shortness of breath.      busPIRone  (BUSPAR ) 30 MG tablet Take 30 mg by mouth 2 (two) times daily. PER PT STATES HER DOSE WAS INCREASED     clonazePAM  (KLONOPIN ) 0.5 MG tablet Take 0.5 mg by mouth 2 (two) times daily.     cyclobenzaprine  (FLEXERIL ) 10 MG tablet Take 1 tablet (10 mg total) by mouth 3 (three) times daily as needed for muscle spasms. 90 tablet 2   diltiazem  (CARDIZEM  CD) 120 MG 24 hr capsule TAKE 1 CAPSULE BY MOUTH EVERY DAY 180 capsule 2   diltiazem  (CARDIZEM ) 30 MG  tablet TAKE 1 TABLET(30 MG) BY MOUTH THREE TIMES DAILY AS NEEDED 270 tablet 0   DULoxetine  (CYMBALTA ) 60 MG capsule Take 60 mg by mouth 2 (two) times daily.      Fluticasone -Salmeterol (ADVAIR) 250-50 MCG/DOSE AEPB Inhale 1 puff into the lungs 2 (two) times daily.     furosemide  (LASIX ) 40 MG tablet TAKE 1 TABLET BY MOUTH EVERY DAY. MAY TAKE ADDITIONAL TABLET IN THE AM FOR WEIGHT GAIN AS NEEDED (Patient taking differently: Take 40 mg by mouth daily.) 110 tablet 2   JARDIANCE 25 MG TABS tablet Take 25 mg by mouth daily.     levofloxacin  (LEVAQUIN ) 500 MG tablet Take 500 mg by mouth daily.     magic mouthwash  (nystatin , lidocaine , diphenhydrAMINE, alum & mag hydroxide) suspension Take 5 mLs by mouth 4 (four) times daily as needed for mouth pain. 140 mL 1   metFORMIN  (GLUCOPHAGE -XR) 500 MG 24 hr tablet Take 1,000 mg by mouth 2 (two) times daily.     ondansetron  (ZOFRAN ) 8 MG tablet Take 1 tablet (8 mg total) by mouth daily as needed for nausea or vomiting. 30 tablet 0   oxyCODONE  (ROXICODONE ) 15 MG immediate release tablet Take 1 tablet (15 mg total) by mouth every 6 (six) hours as needed for pain. 90 tablet 0   OXYGEN Inhale 2 L into the lungs at bedtime. Or as needed throughout the day if short of breath     pantoprazole  (PROTONIX ) 40 MG tablet Take 1 tablet (40 mg total) by mouth daily. 30 tablet 0   polyethylene glycol (MIRALAX  / GLYCOLAX ) 17 g packet Take 17 g by mouth daily as needed for mild constipation or moderate constipation. 30 each 0   potassium chloride  SA (KLOR-CON ) 20 MEQ tablet Take 20 mEq by mouth 2 (two) times daily.     pregabalin  (LYRICA ) 150 MG capsule Take 1 capsule (150 mg total) by mouth 3 (three) times daily. 90 capsule 2   prochlorperazine  (COMPAZINE ) 10 MG tablet Take 1 tablet (10 mg total) by mouth every 6 (six) hours as needed for nausea or vomiting. (Patient taking differently: Take 10 mg by mouth daily as needed for nausea or vomiting.) 30 tablet 1   senna-docusate (SENOKOT-S) 8.6-50 MG tablet Take 1 tablet by mouth 2 (two) times daily. For constipation 60 tablet 0   spironolactone  (ALDACTONE ) 25 MG tablet TAKE 1 TABLET(25 MG) BY MOUTH EVERY MORNING 90 tablet 3   zolpidem (AMBIEN) 10 MG tablet Take 5-10 mg by mouth at bedtime.     No current facility-administered medications for this visit.    VITAL SIGNS: BP (!) 152/88 (BP Location: Left Arm)   Pulse 81   Temp 98.2 F (36.8 C) (Oral)   LMP 03/13/2015 (Approximate) Comment: more than 2 years ago  SpO2 100%  There were no vitals filed for this visit.   Estimated body mass index is 22.81 kg/m as calculated from the  following:   Height as of 02/29/24: 5' 8 (1.727 m).   Weight as of 02/29/24: 150 lb (68 kg).   PERFORMANCE STATUS (ECOG) : 1 - Symptomatic but completely ambulatory  Physical Exam General: NAD Cardiovascular: regular rate and rhythm Pulmonary: normal breathing pattern Extremities: no edema, no joint deformities Skin: no rashes, right biliary drain. Right arm and chest wall bruising s/p fall Neurological: AAO x3  IMPRESSION: Discussed the use of AI scribe software for clinical note transcription with the patient, who gave verbal consent to proceed.  History of Present Illness  DESHONNA TRNKA is a 58 year old female who presents to clinic for follow-up. She recently experienced a fall while walking her dog, resulting in a broken clavicle and bruising on her right arm and chest. The fall occurred when she tripped over a concrete slab with protruding bars.  We discussed her pain at length. Patient reports pain is controlled with use of oxycodone  as needed. Some worsening pain due to recent fall. Will continue oxycodone  15mg  as needed. No adjustments to regimen.   She uses clonazepam  0.5 mg as needed for panic attacks, which have been severe and persistent over the last several years. She feels frustrated by the lack of control over her mind due to these panic attacks.  She is currently facing significant housing instability, as her daughter has given her until September to find alternative accommodation. She is concerned about becoming homeless due to the lack of affordable housing options, particularly those that are income-based. She is considering moving back to [place] where she has friends who might offer support.  I discussed the importance of continued conversation with family and their medical providers regarding overall plan of care and treatment options, ensuring decisions are within the context of the patients values and GOCs. Assessment & Plan Cancer Related Pain  management Pain inadequately managed with current regimen. Increased oxycodone  to 15 mg for better control. - Continue oxycodone  to 15 mg. - Send new prescription to [facility] in [place]. - Instruct her to call the office for prescription issues or prior authorization. - Continue Lyrica  150 mg three times a day. - Flexeril  10 mg 3 times daily as needed  Anxiety with panic attacks Chronic anxiety with panic attacks managed with clonazepam  0.5 mg as needed.  Housing instability and financial difficulties Housing instability and financial difficulties affecting well-being. - Contact social workers for housing resources and support. - Instruct her to bring [employer] paperwork for processing.  Displaced fracture of lateral end of right clavicle Sustained a displaced fracture of the lateral end of the right clavicle due to a fall. This injury contributes to pain and may limit mobility and daily activities.  Contusions of right upper arm and right anterior chest wall Contusions on the right upper arm and right anterior chest wall from the fall likely causing additional discomfort and may take time to heal.  Follow-up Follow-up scheduled to finalize gallbladder surgery decision and manage port maintenance.  -I will see patient back in 4-6 weeks. Sooner if needed.  - Ensure pharmacy prescriptions are filled and check for any issues with prior authorization.  Patient expressed understanding and was in agreement with this plan. She also understands that She can call the clinic at any time with any questions, concerns, or complaints.   Any controlled substances utilized were prescribed in the context of palliative care. PDMP has been reviewed.   Visit consisted of counseling and education dealing with the complex and emotionally intense issues of symptom management and palliative care in the setting of serious and potentially life-threatening illness.  Levon Borer,  AGPCNP-BC  Palliative Medicine Team/[facility]

## 2024-03-16 ENCOUNTER — Ambulatory Visit: Admitting: Podiatry

## 2024-03-21 ENCOUNTER — Encounter: Payer: Self-pay | Admitting: Hematology

## 2024-03-21 NOTE — Progress Notes (Signed)
 erroneous

## 2024-03-23 ENCOUNTER — Ambulatory Visit: Admitting: Podiatry

## 2024-03-27 ENCOUNTER — Ambulatory Visit: Admitting: Podiatry

## 2024-03-28 ENCOUNTER — Other Ambulatory Visit: Payer: Self-pay

## 2024-03-29 ENCOUNTER — Ambulatory Visit (HOSPITAL_COMMUNITY)

## 2024-03-29 ENCOUNTER — Inpatient Hospital Stay

## 2024-04-05 ENCOUNTER — Ambulatory Visit (HOSPITAL_COMMUNITY): Attending: Hematology

## 2024-04-05 ENCOUNTER — Inpatient Hospital Stay

## 2024-04-05 ENCOUNTER — Other Ambulatory Visit: Payer: Self-pay

## 2024-04-09 ENCOUNTER — Inpatient Hospital Stay: Admitting: Hematology

## 2024-04-09 ENCOUNTER — Other Ambulatory Visit: Payer: Self-pay

## 2024-04-09 DIAGNOSIS — Z515 Encounter for palliative care: Secondary | ICD-10-CM

## 2024-04-09 DIAGNOSIS — G893 Neoplasm related pain (acute) (chronic): Secondary | ICD-10-CM

## 2024-04-09 DIAGNOSIS — C20 Malignant neoplasm of rectum: Secondary | ICD-10-CM

## 2024-04-09 MED ORDER — OXYCODONE HCL 15 MG PO TABS: 15.0000 mg | ORAL_TABLET | Freq: Four times a day (QID) | ORAL | 0 refills | Status: DC | PRN

## 2024-04-09 NOTE — Telephone Encounter (Signed)
 Refill request, please see associated orders.

## 2024-04-18 ENCOUNTER — Inpatient Hospital Stay: Attending: Oncology | Admitting: Nurse Practitioner

## 2024-04-18 DIAGNOSIS — Z515 Encounter for palliative care: Secondary | ICD-10-CM | POA: Diagnosis not present

## 2024-04-18 DIAGNOSIS — C20 Malignant neoplasm of rectum: Secondary | ICD-10-CM

## 2024-04-18 DIAGNOSIS — G893 Neoplasm related pain (acute) (chronic): Secondary | ICD-10-CM | POA: Diagnosis not present

## 2024-04-18 DIAGNOSIS — R530 Neoplastic (malignant) related fatigue: Secondary | ICD-10-CM

## 2024-04-19 ENCOUNTER — Encounter: Payer: Self-pay | Admitting: Hematology

## 2024-04-19 ENCOUNTER — Encounter: Payer: Self-pay | Admitting: Nurse Practitioner

## 2024-04-19 NOTE — Progress Notes (Signed)
 Palliative Medicine [facility]  [phone]) [phone] [phone]) [phone]   Name: Erica [name] Date: 04/19/2024 MRN: [id number]  DOB: Apr 01, 1966  Patient Care Team: Tobie Domino, MD as PCP - General (Family Medicine) Perla, Evalene PARAS, MD as PCP - Cardiology (Cardiology) Donette Ellouise DELENA, FNP as Nurse Practitioner (Family Medicine) Perla Evalene PARAS, MD as Consulting Physician (Cardiology) Linard Alm NOVAK, MD (Inactive) as Consulting Physician (Pulmonary Disease) Cindie Jesusa HERO, RN as Registered Nurse Dannielle Arlean FALCON, RN (Inactive) as Registered Nurse Maurie Rayfield BIRCH, RN as Oncology Nurse Navigator Lanny Callander, MD as Consulting Physician (Oncology) Pickenpack-Cousar, Fannie SAILOR, NP as Nurse Practitioner (Hospice and Palliative Medicine) Dewey Rush, MD as Consulting Physician (Radiation Oncology) Therisa Bi, MD as Consulting Physician (Gastroenterology) Isadora Hose, MD as Consulting Physician (Pulmonary Disease)   I connected with Erica [name] on 04/19/24 at  3:30 PM EDT by telephone and verified that I am speaking with the correct person using two identifiers.   I discussed the limitations, risks, security and privacy concerns of performing an evaluation and management service by telemedicine and the availability of in-person appointments. I also discussed with the patient that there may be a patient responsible charge related to this service. The patient expressed understanding and agreed to proceed.   Other persons participating in the visit and their role in the encounter: N/A   Patient's location: Home   Provider's location: [facility]    INTERVAL HISTORY: Erica [name] is a 58 y.o. 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 Palliative ask to see for symptom management and goals of care.   SOCIAL HISTORY:     reports that she has been  smoking cigarettes. She has a 138 pack-year smoking history. She has never used smokeless tobacco. She reports current alcohol use. She reports that she does not use drugs.  ADVANCE DIRECTIVES:  None on file   CODE STATUS: Full code  PAST MEDICAL HISTORY: Past Medical History:  Diagnosis Date   (HFpEF) heart failure with preserved ejection fraction (HCC)    a. 05/2015 Echo: EF 60-65%, no rwma, PASP .   Acute pancreatitis 08/13/2018   Anxiety    Arthritis    Asthma    Bell's palsy    no deficit   Bronchitis    Cancer (HCC) 12/2022   rectal cancer   CHF (congestive heart failure) (HCC)    COPD (chronic obstructive pulmonary disease) (HCC)    Depression    Diabetes mellitus without complication (HCC)    type II 03/2017   Dyspnea    Fatty liver    per patient   Gall stones    per patient   Gastric ulcer    Hyperlipidemia    Hypertension    Lung nodule 12/2022   upper left   OSA (obstructive sleep apnea)    a. did not tolerate CPAP. On oxygen 2L via [place]   Pancreatitis    07/2018   Shoulder injury    6/19   Smoker     ALLERGIES:  is allergic to tramadol and penicillins.  MEDICATIONS:  Current Outpatient Medications  Medication Sig Dispense Refill   Accu-Chek FastClix Lancets MISC Apply topically 2 (two) times daily.     ACCU-CHEK GUIDE TEST test strip 2 (two) times daily.     albuterol  (VENTOLIN  HFA) 108 (90 Base) MCG/ACT inhaler Inhale 2 puffs into the lungs every 6 (six) hours  as needed for wheezing or shortness of breath.      busPIRone  (BUSPAR ) 30 MG tablet Take 30 mg by mouth 2 (two) times daily. PER PT STATES HER DOSE WAS INCREASED     clonazePAM  (KLONOPIN ) 0.5 MG tablet Take 0.5 mg by mouth 2 (two) times daily.     cyclobenzaprine  (FLEXERIL ) 10 MG tablet Take 1 tablet (10 mg total) by mouth 3 (three) times daily as needed for muscle spasms. 90 tablet 2   diltiazem  (CARDIZEM  CD) 120 MG 24 hr capsule TAKE 1 CAPSULE BY MOUTH EVERY DAY 180 capsule 2    diltiazem  (CARDIZEM ) 30 MG tablet TAKE 1 TABLET(30 MG) BY MOUTH THREE TIMES DAILY AS NEEDED 270 tablet 0   DULoxetine  (CYMBALTA ) 60 MG capsule Take 60 mg by mouth 2 (two) times daily.      Fluticasone -Salmeterol (ADVAIR) 250-50 MCG/DOSE AEPB Inhale 1 puff into the lungs 2 (two) times daily.     furosemide  (LASIX ) 40 MG tablet TAKE 1 TABLET BY MOUTH EVERY DAY. MAY TAKE ADDITIONAL TABLET IN THE AM FOR WEIGHT GAIN AS NEEDED (Patient taking differently: Take 40 mg by mouth daily.) 110 tablet 2   JARDIANCE 25 MG TABS tablet Take 25 mg by mouth daily.     levofloxacin  (LEVAQUIN ) 500 MG tablet Take 500 mg by mouth daily.     magic mouthwash (nystatin , lidocaine , diphenhydrAMINE, alum & mag hydroxide) suspension Take 5 mLs by mouth 4 (four) times daily as needed for mouth pain. 140 mL 1   metFORMIN  (GLUCOPHAGE -XR) 500 MG 24 hr tablet Take 1,000 mg by mouth 2 (two) times daily.     ondansetron  (ZOFRAN ) 8 MG tablet Take 1 tablet (8 mg total) by mouth daily as needed for nausea or vomiting. 30 tablet 0   oxyCODONE  (ROXICODONE ) 15 MG immediate release tablet Take 1 tablet (15 mg total) by mouth every 6 (six) hours as needed for pain. 60 tablet 0   OXYGEN Inhale 2 L into the lungs at bedtime. Or as needed throughout the day if short of breath     pantoprazole  (PROTONIX ) 40 MG tablet Take 1 tablet (40 mg total) by mouth daily. 30 tablet 0   polyethylene glycol (MIRALAX  / GLYCOLAX ) 17 g packet Take 17 g by mouth daily as needed for mild constipation or moderate constipation. 30 each 0   potassium chloride  SA (KLOR-CON ) 20 MEQ tablet Take 20 mEq by mouth 2 (two) times daily.     pregabalin  (LYRICA ) 150 MG capsule Take 1 capsule (150 mg total) by mouth 3 (three) times daily. 90 capsule 2   prochlorperazine  (COMPAZINE ) 10 MG tablet Take 1 tablet (10 mg total) by mouth every 6 (six) hours as needed for nausea or vomiting. (Patient taking differently: Take 10 mg by mouth daily as needed for nausea or vomiting.) 30 tablet  1   senna-docusate (SENOKOT-S) 8.6-50 MG tablet Take 1 tablet by mouth 2 (two) times daily. For constipation 60 tablet 0   spironolactone  (ALDACTONE ) 25 MG tablet TAKE 1 TABLET(25 MG) BY MOUTH EVERY MORNING 90 tablet 3   zolpidem (AMBIEN) 10 MG tablet Take 5-10 mg by mouth at bedtime.     No current facility-administered medications for this visit.    VITAL SIGNS: LMP 03/13/2015 (Approximate) Comment: more than 2 years ago There were no vitals filed for this visit.   Estimated body mass index is 22.81 kg/m as calculated from the following:   Height as of 02/29/24: 5' 8 (1.727 m).   Weight as  of 02/29/24: 150 lb (68 kg).   PERFORMANCE STATUS (ECOG) : 1 - Symptomatic but completely ambulatory  IMPRESSION: Discussed the use of AI scribe software for clinical note transcription with the patient, who gave verbal consent to proceed.  History of Present Illness Erica [name] is a 58 year old female who I connected with by phone for symptom management follow-up. No acute distress.  Denies uncontrolled nausea, vomiting, constipation, or diarrhea.  Occasional fatigue.  Reports her appetite fluctuates.  Some days are better than others.  No signs of weight loss at this time.  She has recently moved back to [place] and is experiencing significant personal stress due to a conflict with her daughter, who has prohibited her from accessing her belongings on the property. This situation has caused her distress, and she plans to retrieve her belongings with police assistance to avoid further conflict.  Patient has secured housing in a camper.  She is currently managing financially by doing 'scrapping' to have some money in her pocket. She describes the past month as challenging and is taking things 'one day at a time'.  She inquires about continuing her palliative care and medication management with a local provider, expressing a need to transition her care to a doctor in the future closer to her  current location.  Advised patient we will support her in any way possible.  We discussed her pain at length. Patient reports pain is controlled with use of oxycodone  as needed. Will continue oxycodone  15mg  as needed. No adjustments to regimen.   I discussed the importance of continued conversation with family and their medical providers regarding overall plan of care and treatment options, ensuring decisions are within the context of the patients values and GOCs. Assessment & Plan Cancer Related Pain management Pain inadequately managed with current regimen. Increased oxycodone  to 15 mg for better control. - Continue oxycodone  to 15 mg. - Continue Lyrica  150 mg three times a day. - Coordinate with a colleague, Sidra, NP at [facility] for potential in-person visits.  Anxiety with panic attacks Chronic anxiety with panic attacks managed with clonazepam  0.5 mg as needed.  Housing instability and financial difficulties Housing instability and financial difficulties affecting well-being. - Contact social workers for housing resources and support. - Instruct her to bring [employer] paperwork for processing.  Follow-up Follow-up scheduled to finalize gallbladder surgery decision and manage port maintenance.  -I will see patient back in 4-6 weeks. Sooner if needed.    Patient expressed understanding and was in agreement with this plan. She also understands that She can call the clinic at any time with any questions, concerns, or complaints.   Any controlled substances utilized were prescribed in the context of palliative care. PDMP has been reviewed.   I provided 30 minutes of non face-to-face telephone visit time during this encounter, and > 50% was spent counseling as documented under my assessment & plan. Visit consisted of counseling and education dealing with the complex and emotionally intense issues of symptom management and palliative care in the setting of serious  and potentially life-threatening illness.  Levon Borer, AGPCNP-BC  Palliative Medicine Team/[facility]

## 2024-04-26 ENCOUNTER — Telehealth: Payer: Self-pay

## 2024-04-26 ENCOUNTER — Other Ambulatory Visit: Payer: Self-pay | Admitting: Nurse Practitioner

## 2024-04-26 DIAGNOSIS — G893 Neoplasm related pain (acute) (chronic): Secondary | ICD-10-CM

## 2024-04-26 DIAGNOSIS — C20 Malignant neoplasm of rectum: Secondary | ICD-10-CM

## 2024-04-26 DIAGNOSIS — Z515 Encounter for palliative care: Secondary | ICD-10-CM

## 2024-04-26 MED ORDER — OXYCODONE HCL 15 MG PO TABS: 15.0000 mg | ORAL_TABLET | Freq: Four times a day (QID) | ORAL | 0 refills | Status: DC | PRN

## 2024-04-26 NOTE — Telephone Encounter (Signed)
 Pt called and LVM for refill, attempted to call pt back. No answer, unable to LVM. Refill sent to preferred pharmacy.

## 2024-04-30 ENCOUNTER — Other Ambulatory Visit: Payer: Self-pay | Admitting: *Deleted

## 2024-04-30 ENCOUNTER — Telehealth: Payer: Self-pay | Admitting: *Deleted

## 2024-04-30 DIAGNOSIS — C20 Malignant neoplasm of rectum: Secondary | ICD-10-CM

## 2024-04-30 NOTE — Telephone Encounter (Signed)
 Pt called requesting to be referred back to [facility] due to relocating back to [place]. She states that she does not have transportation to get to and from the [facility] in [place]. Referral sent to [facility]

## 2024-05-07 ENCOUNTER — Inpatient Hospital Stay

## 2024-05-07 ENCOUNTER — Inpatient Hospital Stay: Admitting: Oncology

## 2024-05-10 ENCOUNTER — Inpatient Hospital Stay

## 2024-05-16 ENCOUNTER — Emergency Department
Admission: EM | Admit: 2024-05-16 | Discharge: 2024-05-16 | Disposition: A | Discharge status: Home / Self Care | Attending: Emergency Medicine | Admitting: Emergency Medicine

## 2024-05-16 ENCOUNTER — Encounter: Payer: Self-pay | Admitting: Emergency Medicine

## 2024-05-16 ENCOUNTER — Other Ambulatory Visit: Payer: Self-pay

## 2024-05-16 ENCOUNTER — Emergency Department

## 2024-05-16 DIAGNOSIS — I110 Hypertensive heart disease with heart failure: Secondary | ICD-10-CM | POA: Diagnosis not present

## 2024-05-16 DIAGNOSIS — F17200 Nicotine dependence, unspecified, uncomplicated: Secondary | ICD-10-CM | POA: Diagnosis not present

## 2024-05-16 DIAGNOSIS — F10920 Alcohol use, unspecified with intoxication, uncomplicated: Secondary | ICD-10-CM

## 2024-05-16 DIAGNOSIS — I5030 Unspecified diastolic (congestive) heart failure: Secondary | ICD-10-CM | POA: Insufficient documentation

## 2024-05-16 DIAGNOSIS — J4489 Other specified chronic obstructive pulmonary disease: Secondary | ICD-10-CM | POA: Insufficient documentation

## 2024-05-16 DIAGNOSIS — F10129 Alcohol abuse with intoxication, unspecified: Secondary | ICD-10-CM | POA: Diagnosis not present

## 2024-05-16 DIAGNOSIS — W19XXXA Unspecified fall, initial encounter: Secondary | ICD-10-CM | POA: Diagnosis not present

## 2024-05-16 DIAGNOSIS — E119 Type 2 diabetes mellitus without complications: Secondary | ICD-10-CM | POA: Insufficient documentation

## 2024-05-16 DIAGNOSIS — Z79899 Other long term (current) drug therapy: Secondary | ICD-10-CM | POA: Diagnosis not present

## 2024-05-16 DIAGNOSIS — M545 Low back pain, unspecified: Secondary | ICD-10-CM | POA: Insufficient documentation

## 2024-05-16 DIAGNOSIS — Y908 Blood alcohol level of 240 mg/100 ml or more: Secondary | ICD-10-CM | POA: Diagnosis not present

## 2024-05-16 DIAGNOSIS — M25552 Pain in left hip: Secondary | ICD-10-CM | POA: Insufficient documentation

## 2024-05-16 DIAGNOSIS — E876 Hypokalemia: Secondary | ICD-10-CM | POA: Diagnosis not present

## 2024-05-16 DIAGNOSIS — Z85048 Personal history of other malignant neoplasm of rectum, rectosigmoid junction, and anus: Secondary | ICD-10-CM | POA: Insufficient documentation

## 2024-05-16 DIAGNOSIS — Z7984 Long term (current) use of oral hypoglycemic drugs: Secondary | ICD-10-CM | POA: Insufficient documentation

## 2024-05-16 LAB — COMPREHENSIVE METABOLIC PANEL WITH GFR
ALT: 10 U/L (ref 0–44)
AST: 17 U/L (ref 15–41)
Albumin: 3.4 g/dL — ABNORMAL LOW (ref 3.5–5.0)
Alkaline Phosphatase: 60 U/L (ref 38–126)
Anion gap: 15 (ref 5–15)
BUN: 7 mg/dL (ref 6–20)
CO2: 23 mmol/L (ref 22–32)
Calcium: 8.1 mg/dL — ABNORMAL LOW (ref 8.9–10.3)
Chloride: 101 mmol/L (ref 98–111)
Creatinine, Ser: 0.74 mg/dL (ref 0.44–1.00)
GFR, Estimated: >60 mL/min (ref >60)
Glucose, Bld: 77 mg/dL (ref 70–99)
Potassium: 2.8 mmol/L — ABNORMAL LOW (ref 3.5–5.1)
Sodium: 139 mmol/L (ref 135–145)
Total Bilirubin: 0.4 mg/dL (ref 0.0–1.2)
Total Protein: 6.6 g/dL (ref 6.5–8.1)

## 2024-05-16 LAB — ETHANOL: Alcohol, Ethyl (B): 271 mg/dL — ABNORMAL HIGH (ref <15)

## 2024-05-16 LAB — CBC WITH DIFFERENTIAL/PLATELET
Abs Immature Granulocytes: 0 K/uL (ref 0.00–0.07)
Basophils Absolute: 0 K/uL (ref 0.0–0.1)
Basophils Relative: 1 %
Eosinophils Absolute: 0.1 K/uL (ref 0.0–0.5)
Eosinophils Relative: 2 %
HCT: 34.8 % — ABNORMAL LOW (ref 36.0–46.0)
Hemoglobin: 11.4 g/dL — ABNORMAL LOW (ref 12.0–15.0)
Immature Granulocytes: 0 %
Lymphocytes Relative: 25 %
Lymphs Abs: 0.9 K/uL (ref 0.7–4.0)
MCH: 29.5 pg (ref 26.0–34.0)
MCHC: 32.8 g/dL (ref 30.0–36.0)
MCV: 89.9 fL (ref 80.0–100.0)
Monocytes Absolute: 0.4 K/uL (ref 0.1–1.0)
Monocytes Relative: 10 %
Neutro Abs: 2.2 K/uL (ref 1.7–7.7)
Neutrophils Relative %: 62 %
Platelets: 175 K/uL (ref 150–400)
RBC: 3.87 MIL/uL (ref 3.87–5.11)
RDW: 16.7 % — ABNORMAL HIGH (ref 11.5–15.5)
WBC: 3.5 K/uL — ABNORMAL LOW (ref 4.0–10.5)
nRBC: 0 % (ref 0.0–0.2)

## 2024-05-16 LAB — URINALYSIS, ROUTINE W REFLEX MICROSCOPIC
Bilirubin Urine: NEGATIVE
Glucose, UA: NEGATIVE mg/dL
Hgb urine dipstick: NEGATIVE
Ketones, ur: NEGATIVE mg/dL
Leukocytes,Ua: NEGATIVE
Nitrite: NEGATIVE
Protein, ur: NEGATIVE mg/dL
Specific Gravity, Urine: 1.002 — ABNORMAL LOW (ref 1.005–1.030)
pH: 6 (ref 5.0–8.0)

## 2024-05-16 LAB — URINE DRUG SCREEN, QUALITATIVE (ARMC ONLY)
Amphetamines, Ur Screen: NOT DETECTED
Barbiturates, Ur Screen: NOT DETECTED
Benzodiazepine, Ur Scrn: NOT DETECTED
Cannabinoid 50 Ng, Ur ~~LOC~~: NOT DETECTED
Cocaine Metabolite,Ur ~~LOC~~: NOT DETECTED
MDMA (Ecstasy)Ur Screen: NOT DETECTED
Methadone Scn, Ur: NOT DETECTED
Opiate, Ur Screen: NOT DETECTED
Phencyclidine (PCP) Ur S: NOT DETECTED
Tricyclic, Ur Screen: NOT DETECTED

## 2024-05-16 LAB — MAGNESIUM: Magnesium: 1.5 mg/dL — ABNORMAL LOW (ref 1.7–2.4)

## 2024-05-16 LAB — LIPASE, BLOOD: Lipase: 21 U/L (ref 11–51)

## 2024-05-16 LAB — CK: Total CK: 50 U/L (ref 38–234)

## 2024-05-16 MED ORDER — ACETAMINOPHEN 500 MG PO TABS
1000.0000 mg | ORAL_TABLET | Freq: Once | ORAL | Status: AC
  Administered 2024-05-16: 1000 mg via ORAL
  Filled 2024-05-16: qty 2

## 2024-05-16 MED ORDER — THIAMINE HCL 100 MG/ML IJ SOLN
100.0000 mg | Freq: Once | INTRAMUSCULAR | Status: AC
  Administered 2024-05-16: 100 mg via INTRAVENOUS
  Filled 2024-05-16: qty 2

## 2024-05-16 MED ORDER — POTASSIUM CHLORIDE 20 MEQ PO PACK
60.0000 meq | PACK | Freq: Once | ORAL | Status: AC
  Administered 2024-05-16: 60 meq via ORAL
  Filled 2024-05-16: qty 3

## 2024-05-16 MED ORDER — POTASSIUM CHLORIDE 10 MEQ/100ML IV SOLN
10.0000 meq | INTRAVENOUS | Status: AC
  Administered 2024-05-16 (×2): 10 meq via INTRAVENOUS
  Filled 2024-05-16: qty 100

## 2024-05-16 MED ORDER — KETOROLAC TROMETHAMINE 30 MG/ML IJ SOLN
15.0000 mg | Freq: Once | INTRAMUSCULAR | Status: AC
  Administered 2024-05-16: 15 mg via INTRAVENOUS
  Filled 2024-05-16: qty 1

## 2024-05-16 MED ORDER — MAGNESIUM SULFATE 2 GM/50ML IV SOLN
2.0000 g | Freq: Once | INTRAVENOUS | Status: AC
  Administered 2024-05-16: 2 g via INTRAVENOUS
  Filled 2024-05-16: qty 50

## 2024-05-16 MED ORDER — ONDANSETRON HCL 4 MG/2ML IJ SOLN
4.0000 mg | Freq: Once | INTRAMUSCULAR | Status: AC
  Administered 2024-05-16: 4 mg via INTRAVENOUS
  Filled 2024-05-16: qty 2

## 2024-05-16 MED ORDER — OXYCODONE HCL 5 MG PO TABS
15.0000 mg | ORAL_TABLET | Freq: Once | ORAL | Status: AC
  Administered 2024-05-16: 15 mg via ORAL
  Filled 2024-05-16: qty 3

## 2024-05-16 MED ORDER — POTASSIUM CHLORIDE CRYS ER 20 MEQ PO TBCR: 20.0000 meq | EXTENDED_RELEASE_TABLET | Freq: Two times a day (BID) | ORAL | 0 refills | Status: AC

## 2024-05-16 MED ORDER — ONDANSETRON 4 MG PO TBDP: 4.0000 mg | ORAL_TABLET | Freq: Four times a day (QID) | ORAL | 0 refills | Status: DC | PRN

## 2024-05-16 MED ORDER — SODIUM CHLORIDE 0.9 % IV BOLUS (SEPSIS)
1000.0000 mL | Freq: Once | INTRAVENOUS | Status: AC
  Administered 2024-05-16: 1000 mL via INTRAVENOUS

## 2024-05-16 NOTE — ED Notes (Signed)
 Patient transported to CT

## 2024-05-16 NOTE — ED Notes (Signed)
 Called [facility] to initiate cardiac monitoring.

## 2024-05-16 NOTE — ED Notes (Signed)
Pt ambulated without assistance

## 2024-05-16 NOTE — Discharge Instructions (Addendum)
 Your potassium level today was low at 2.8 and your magnesium  was low at 1.5.  You have been given replacement for both.  I recommend avoiding alcohol as this can cause your electrolytes to drop.  Taking furosemide  for edema can also affect your electrolytes.  I recommend follow-up with your primary care doctor in 1 week to have this rechecked.  Please continue your medications at home as prescribed.  Your CT scans, x-ray showed no acute traumatic injury.

## 2024-05-16 NOTE — ED Provider Notes (Signed)
 [facility] Provider Note    Event Date/Time   First MD Initiated Contact with Patient 05/16/24 0010     (approximate)   History   Fall   HPI  Erica [name] is a 58 y.o. female with history of stage IV rectal cancer, COPD, CHF with preserved ejection fraction, hypertension, type 2 diabetes, hyperlipidemia who presents to the emergency department with EMS after a fall.  States that she had 4 vodka drinks today.  States she was going outside to walk her dog and got tripped up on her dog and fell.  She is not sure if she hit her head or lost consciousness.  Is complaining of low back pain and left hip pain but some of that is chronic.  Laid on the ground outside in the cold for about an hour.  She does report feeling sick to her stomach for the past week with vomiting.  No abdominal pain currently.  No diarrhea.  No chest pain or shortness of breath.  No known fevers.  No urinary symptoms.  She denies that she drinks alcohol daily.  Denies history of withdrawal seizure, DTs.  Normal vital signs with EMS.  Blood sugar was 81.  It does appear per her recent oncology notes the patient is also prescribed oxycodone  and Lyrica  for her cancer related pain.  She also takes clonazepam  for chronic anxiety.   History provided by patient, EMS.    Past Medical History:  Diagnosis Date   (HFpEF) heart failure with preserved ejection fraction (HCC)    a. 05/2015 Echo: EF 60-65%, no rwma, PASP .   Acute pancreatitis 08/13/2018   Anxiety    Arthritis    Asthma    Bell's palsy    no deficit   Bronchitis    Cancer (HCC) 12/2022   rectal cancer   CHF (congestive heart failure) (HCC)    COPD (chronic obstructive pulmonary disease) (HCC)    Depression    Diabetes mellitus without complication (HCC)    type II 03/2017   Dyspnea    Fatty liver    per patient   Gall stones    per patient   Gastric ulcer    Hyperlipidemia    Hypertension    Lung nodule 12/2022    upper left   OSA (obstructive sleep apnea)    a. did not tolerate CPAP. On oxygen 2L via [place]   Pancreatitis    07/2018   Shoulder injury    6/19   Smoker     Past Surgical History:  Procedure Laterality Date   BRONCHIAL NEEDLE ASPIRATION BIOPSY  01/27/2023   Procedure: BRONCHIAL NEEDLE ASPIRATION BIOPSIES;  Surgeon: Isadora Hose, MD;  Location: [facility] ENDOSCOPY;  Service: Pulmonary;;   COLONOSCOPY WITH PROPOFOL  N/A 12/15/2022   Procedure: COLONOSCOPY WITH PROPOFOL ;  Surgeon: Therisa Bi, MD;  Location: [facility] ENDOSCOPY;  Service: Gastroenterology;  Laterality: N/A;   COLONOSCOPY WITH PROPOFOL  N/A 12/16/2022   Procedure: COLONOSCOPY WITH PROPOFOL ;  Surgeon: Therisa Bi, MD;  Location: [facility] ENDOSCOPY;  Service: Gastroenterology;  Laterality: N/A;   FLEXIBLE BRONCHOSCOPY N/A 06/20/2015   Procedure: FLEXIBLE BRONCHOSCOPY;  Surgeon: Laurance Flor, MD;  Location: [facility] ORS;  Service: Pulmonary;  Laterality: N/A;   IR EXCHANGE BILIARY DRAIN  01/19/2024   IR IMAGING GUIDED PORT INSERTION  02/25/2023   IR PERC CHOLECYSTOSTOMY  11/25/2023   IR REMOVAL BILIARY DRAIN  02/02/2024   KNEE SURGERY Left    8 knee surgeries    MEDICATIONS:  Prior to Admission medications   Medication Sig Start Date End Date Taking? Authorizing Provider  Accu-Chek FastClix Lancets MISC Apply topically 2 (two) times daily. 01/17/24   [provider]  ACCU-CHEK GUIDE TEST test strip 2 (two) times daily. 01/16/24   [provider]  albuterol  (VENTOLIN  HFA) 108 (90 Base) MCG/ACT inhaler Inhale 2 puffs into the lungs every 6 (six) hours as needed for wheezing or shortness of breath.     [provider]  busPIRone  (BUSPAR ) 30 MG tablet Take 30 mg by mouth 2 (two) times daily. PER PT STATES HER DOSE WAS INCREASED 02/14/20   [provider]  clonazePAM  (KLONOPIN ) 0.5 MG tablet Take 0.5 mg by mouth 2 (two) times daily. 03/12/20   [provider]  cyclobenzaprine  (FLEXERIL ) 10 MG tablet Take 1  tablet (10 mg total) by mouth 3 (three) times daily as needed for muscle spasms. 02/16/24   Pickenpack-Cousar, Athena N, NP  diltiazem  (CARDIZEM  CD) 120 MG 24 hr capsule TAKE 1 CAPSULE BY MOUTH EVERY DAY 04/22/23   Gollan, Timothy J, MD  diltiazem  (CARDIZEM ) 30 MG tablet TAKE 1 TABLET(30 MG) BY MOUTH THREE TIMES DAILY AS NEEDED 02/16/24   Gollan, Timothy J, MD  DULoxetine  (CYMBALTA ) 60 MG capsule Take 60 mg by mouth 2 (two) times daily.     [provider]  Fluticasone -Salmeterol (ADVAIR) 250-50 MCG/DOSE AEPB Inhale 1 puff into the lungs 2 (two) times daily.    [provider]  furosemide  (LASIX ) 40 MG tablet TAKE 1 TABLET BY MOUTH EVERY DAY. MAY TAKE ADDITIONAL TABLET IN THE AM FOR WEIGHT GAIN AS NEEDED Patient taking differently: Take 40 mg by mouth daily. 03/10/23   Gollan, Timothy J, MD  JARDIANCE 25 MG TABS tablet Take 25 mg by mouth daily. 08/18/21   [provider]  levofloxacin  (LEVAQUIN ) 500 MG tablet Take 500 mg by mouth daily. 12/08/23   [provider]  magic mouthwash (nystatin , lidocaine , diphenhydrAMINE, alum & mag hydroxide) suspension Take 5 mLs by mouth 4 (four) times daily as needed for mouth pain. 04/14/23   Lanny Callander, MD  metFORMIN  (GLUCOPHAGE -XR) 500 MG 24 hr tablet Take 1,000 mg by mouth 2 (two) times daily. 10/21/22   [provider]  ondansetron  (ZOFRAN ) 8 MG tablet Take 1 tablet (8 mg total) by mouth daily as needed for nausea or vomiting. 11/27/23   Rai, Nydia POUR, MD  oxyCODONE  (ROXICODONE ) 15 MG immediate release tablet Take 1 tablet (15 mg total) by mouth every 6 (six) hours as needed for pain. 04/26/24   Pickenpack-Cousar, Athena N, NP  OXYGEN Inhale 2 L into the lungs at bedtime. Or as needed throughout the day if short of breath    [provider]  pantoprazole  (PROTONIX ) 40 MG tablet Take 1 tablet (40 mg total) by mouth daily. 05/04/18   Delores Raford SAILOR, MD  polyethylene glycol (MIRALAX  / GLYCOLAX ) 17 g packet Take 17 g by  mouth daily as needed for mild constipation or moderate constipation. 11/27/23   Rai, Nydia POUR, MD  potassium chloride  SA (KLOR-CON ) 20 MEQ tablet Take 20 mEq by mouth 2 (two) times daily. 02/15/20   [provider]  pregabalin  (LYRICA ) 150 MG capsule Take 1 capsule (150 mg total) by mouth 3 (three) times daily. 01/05/24   Pickenpack-Cousar, Athena N, NP  prochlorperazine  (COMPAZINE ) 10 MG tablet Take 1 tablet (10 mg total) by mouth every 6 (six) hours as needed for nausea or vomiting. Patient taking differently: Take 10 mg  by mouth daily as needed for nausea or vomiting. 02/17/23   Lanny Callander, MD  senna-docusate (SENOKOT-S) 8.6-50 MG tablet Take 1 tablet by mouth 2 (two) times daily. For constipation 11/27/23   Rai, Ripudeep K, MD  spironolactone  (ALDACTONE ) 25 MG tablet TAKE 1 TABLET(25 MG) BY MOUTH EVERY MORNING 05/23/23   Gollan, Timothy J, MD  zolpidem (AMBIEN) 10 MG tablet Take 5-10 mg by mouth at bedtime. 12/01/22   [provider]    Physical Exam   Triage Vital Signs: ED Triage Vitals  Encounter Vitals Group     BP 05/16/24 0017 139/80     Girls Systolic BP Percentile --      Girls Diastolic BP Percentile --      Boys Systolic BP Percentile --      Boys Diastolic BP Percentile --      Pulse Rate 05/16/24 0017 87     Resp 05/16/24 0017 15     Temp 05/16/24 0017 (!) 97.4 F (36.3 C)     Temp Source 05/16/24 0017 Oral     SpO2 05/16/24 0017 96 %     Weight 05/16/24 0014 143 lb 15.4 oz (65.3 kg)     Height 05/16/24 0014 5' 8 (1.727 m)     Head Circumference --      Peak Flow --      Pain Score 05/16/24 0014 9     Pain Loc --      Pain Education --      Exclude from Growth Chart --     Most recent vital signs: Vitals:   05/16/24 0300 05/16/24 0330  BP: 108/66 122/76  Pulse: 84 78  Resp: 16   Temp:    SpO2: 100% 100%     CONSTITUTIONAL: Alert, responds appropriately to questions.  Intoxicated, smells of alcohol, chronically ill-appearing, appears older  than stated age. HEAD: Normocephalic; atraumatic EYES: Conjunctivae clear, PERRL, EOMI ENT: normal nose; no rhinorrhea; moist mucous membranes; pharynx without lesions noted; no dental injury; no septal hematoma, no epistaxis; no facial deformity or bony tenderness NECK: Supple, no midline spinal tenderness, step-off or deformity; trachea midline CARD: RRR; S1 and S2 appreciated; no murmurs, no clicks, no rubs, no gallops RESP: Normal chest excursion without splinting or tachypnea; breath sounds clear and equal bilaterally; no wheezes, no rhonchi, no rales; no hypoxia or respiratory distress CHEST:  chest wall stable, no crepitus or ecchymosis or deformity, nontender to palpation; no flail chest ABD/GI: Non-distended; soft, non-tender, no rebound, no guarding; no ecchymosis or other lesions noted PELVIS:  stable, nontender to palpation, no leg length discrepancy BACK:  The back appears normal; tender over the lumbar spine without step-off or deformity EXT: Normal ROM in all joints; no edema; normal capillary refill; no cyanosis, no bony tenderness or bony deformity of patient's extremities, no joint effusions, compartments are soft, extremities are warm and well-perfused, no ecchymosis, 2+ DP pulses bilaterally SKIN: Normal color for age and race; warm NEURO: No facial asymmetry, normal speech, moving all extremities equally  ED Results / Procedures / Treatments   LABS: (all labs ordered are listed, but only abnormal results are displayed) Labs Reviewed  CBC WITH DIFFERENTIAL/PLATELET - Abnormal; Notable for the following components:      Result Value   WBC 3.5 (*)    Hemoglobin 11.4 (*)    HCT 34.8 (*)    RDW 16.7 (*)    All other components within normal limits  COMPREHENSIVE METABOLIC PANEL WITH GFR -  Abnormal; Notable for the following components:   Potassium 2.8 (*)    Calcium  8.1 (*)    Albumin 3.4 (*)    All other components within normal limits  URINALYSIS, ROUTINE W REFLEX  MICROSCOPIC - Abnormal; Notable for the following components:   Color, Urine STRAW (*)    APPearance CLEAR (*)    Specific Gravity, Urine 1.002 (*)    All other components within normal limits  ETHANOL - Abnormal; Notable for the following components:   Alcohol, Ethyl (B) 271 (*)    All other components within normal limits  MAGNESIUM  - Abnormal; Notable for the following components:   Magnesium  1.5 (*)    All other components within normal limits  LIPASE, BLOOD  URINE DRUG SCREEN, QUALITATIVE ([facility] ONLY)  CK     EKG:  EKG Interpretation Date/Time:  Wednesday May 16 2024 00:50:37 EDT Ventricular Rate:  81 PR Interval:  182 QRS Duration:  96 QT Interval:  412 QTC Calculation: 478 R Axis:   68  Text Interpretation: Normal sinus rhythm Anterior infarct , age undetermined Abnormal ECG When compared with ECG of 23-Nov-2023 16:26, PREVIOUS ECG IS PRESENT Confirmed by Neomi Neptune [phone]) on 05/16/2024 12:58:11 AM          RADIOLOGY: My personal review and interpretation of imaging: No acute traumatic injury.  I have personally reviewed all radiology reports. CT Cervical Spine Wo Contrast Result Date: 05/16/2024 EXAM: CT CERVICAL SPINE WITHOUT CONTRAST 05/16/2024 01:20:34 AM TECHNIQUE: CT of the cervical spine was performed without the administration of intravenous contrast. Multiplanar reformatted images are provided for review. Automated exposure control, iterative reconstruction, and/or weight based adjustment of the mA/kV was utilized to reduce the radiation dose to as low as reasonably achievable. COMPARISON: None available. CLINICAL HISTORY: Neck trauma, intoxicated or obtunded (Age >= 16y). Pt with a fall down some steps from her camper. Tripped over the dog while walking outside. ETOH with Vodka loaded. She was found outside and had been on the ground for about an hour. Hx: Chronic pain. FINDINGS: CERVICAL SPINE: BONES AND ALIGNMENT: No acute fracture or traumatic  malalignment. 2-mm anterolisthesis C3-C4 and C4-C5 is present likely degenerative in nature. DEGENERATIVE CHANGES: Disc space narrowing and endplate remodeling is seen throughout the cervical spine, most severe at C5-C7 in keeping with changes of mild-to-moderate degenerative disc disease. Posteriorly oriented disc osteophytes are seen at C5-C6. Disc osteophytes at C5-C6 result in mild central canal stenosis with an AP diameter of the spinal canal of approximately 8 mm and abutment of the thecal sac. Uncovertebral arthrosis results in moderate neural foraminal narrowing on the left at C4-C5 and on the right at C5-C6. SOFT TISSUES: No prevertebral soft tissue swelling. IMPRESSION: 1. No acute fracture or listhesis. 2. Mild-to-moderate degenerative disc disease with disc space narrowing and endplate remodeling, most severe at C5C7. 3. 2-mm anterolisthesis at C34 and C45, likely degenerative. 4. Posterior disc osteophytes at C56 cause mild central canal stenosis (AP canal diameter ~8 mm) with thecal sac abutment. 5. Uncovertebral arthrosis with moderate neural foraminal narrowing: left at C45 and right at C56. Electronically signed by: Dorethia Molt MD 05/16/2024 01:39 AM EDT RP Workstation: [id number]   DG Lumbar Spine Complete Result Date: 05/16/2024 EXAM: 4 VIEW(S) XRAY OF THE LUMBAR SPINE 05/16/2024 01:29:00 AM COMPARISON: None available. CLINICAL HISTORY: Fall, intoxicated. BIBA due to fall down some steps from her camper. Tripped over the dog while walking outside. ETOH with Vodka loaded. She was found outside and had been on  the ground for about an hour. Hx: Chronic pain. Current pain is back and hip. FINDINGS: LUMBAR SPINE: BONES: Straightening of the lumbar spine. No acute fracture. No listhesis. No aggressive appearing osseous lesion. Vertebral body height is preserved. DISCS AND DEGENERATIVE CHANGES: Intervertebral disc height loss and endplate abnormality is seen throughout the lumbar spine in  keeping with changes of diffuse moderate to severe degenerative disc disease. SOFT TISSUES: Vascular calcifications noted. IMPRESSION: 1. No acute fracture or listhesis. 2. Diffuse moderate-to-severe degenerative disc disease throughout the lumbar spine. Electronically signed by: Dorethia Molt MD 05/16/2024 01:35 AM EDT RP Workstation: [id number]   DG Hip Unilat W or Wo Pelvis 2-3 Views Left Result Date: 05/16/2024 EXAM: 2 or 3 VIEW(S) XRAY OF THE LEFT HIP 05/16/2024 01:29:00 AM COMPARISON: None available. CLINICAL HISTORY: Fall, intoxicated. BIBA due to fall down some steps from her camper. Tripped over the dog while walking outside. ETOH with Vodka loaded. She was found outside and had been on the ground for about an hour. Hx: Chronic pain. Current pain is back and hip. FINDINGS: BONES AND JOINTS: Normal alignment. No acute fracture or dislocation. Mild asymmetric right hip degenerative arthritis. Vascular calcifications noted. SOFT TISSUES: The soft tissues are unremarkable. IMPRESSION: 1. No acute fracture or dislocation. Electronically signed by: Dorethia Molt MD 05/16/2024 01:33 AM EDT RP Workstation: [id number]   CT HEAD WO CONTRAST ( ) Result Date: 05/16/2024 EXAM: CT HEAD WITHOUT CONTRAST 05/16/2024 01:20:34 AM TECHNIQUE: CT of the head was performed without the administration of intravenous contrast. Automated exposure control, iterative reconstruction, and/or weight based adjustment of the mA/kV was utilized to reduce the radiation dose to as low as reasonably achievable. COMPARISON: None available. CLINICAL HISTORY: Head trauma, moderate-severe; Fall, possible head injury, possible loss of consciousness, intoxicated. Pt with a fall down some steps from her camper. Tripped over the dog while walking outside. ETOH with Vodka loaded. She was found outside and had been on the ground for about an hour.; Hx: Chronic pain. FINDINGS: BRAIN AND VENTRICLES: No acute hemorrhage. No evidence of acute  infarct. No hydrocephalus. No extra-axial collection. No mass effect or midline shift. ORBITS: No acute abnormality. SINUSES: No acute abnormality. SOFT TISSUES AND SKULL: No acute soft tissue abnormality. No skull fracture. IMPRESSION: 1. No acute intracranial abnormality. Electronically signed by: Dorethia Molt MD 05/16/2024 01:32 AM EDT RP Workstation: [id number]     PROCEDURES:  Critical Care performed: Yes, see critical care procedure note(s)   CRITICAL CARE Performed by: Josette Sink   Total critical care time: 30 minutes  Critical care time was exclusive of separately billable procedures and treating other patients.  Critical care was necessary to treat or prevent imminent or life-threatening deterioration.  Critical care was time spent personally by me on the following activities: development of treatment plan with patient and/or surrogate as well as nursing, discussions with consultants, evaluation of patient's response to treatment, examination of patient, obtaining history from patient or surrogate, ordering and performing treatments and interventions, ordering and review of laboratory studies, ordering and review of radiographic studies, pulse oximetry and re-evaluation of patient's condition.   SABRA1-3 Lead EKG Interpretation  Performed by: Tranell Wojtkiewicz, Josette SAILOR, DO Authorized by: Quintez Maselli, Josette SAILOR, DO     Interpretation: normal     ECG rate:  87   ECG rate assessment: normal     Rhythm: sinus rhythm     Ectopy: none     Conduction: normal       IMPRESSION / MDM / ASSESSMENT AND  PLAN / ED COURSE  I reviewed the triage vital signs and the nursing notes.  Patient here with alcohol intoxication, fall after she tripped over her dog.  Complaining of back pain, left hip pain.  The patient is on the cardiac monitor to evaluate for evidence of arrhythmia and/or significant heart rate changes.   DIFFERENTIAL DIAGNOSIS (includes but not limited to):   Alcohol intoxication,  dehydration, electrolyte derangement, anemia, concussion, intracranial hemorrhage, skull fracture, cervical spine fracture, lumbar fracture, hip fracture, hip dislocation, hip contusion  Patient's presentation is most consistent with acute presentation with potential threat to life or bodily function.  PLAN: Will obtain labs, urine, EKG.  Will give warm IV fluids, Toradol , Zofran  for symptomatic relief.  Will obtain CT of the head, cervical spine, x-rays of the lumbar spine and left hip.   MEDICATIONS GIVEN IN ED: Medications  sodium chloride  0.9 % bolus 1,000 mL (0 mLs Intravenous Stopped 05/16/24 0230)  ondansetron  (ZOFRAN ) injection 4 mg (4 mg Intravenous Given 05/16/24 0043)  ketorolac  (TORADOL ) 30 MG/ML injection 15 mg (15 mg Intravenous Given 05/16/24 0043)  thiamine (VITAMIN B1) injection 100 mg (100 mg Intravenous Given 05/16/24 0042)  magnesium  sulfate IVPB 2 g 50 mL (0 g Intravenous Stopped 05/16/24 0230)  potassium chloride  10 mEq in 100 mL IVPB (0 mEq Intravenous Stopped 05/16/24 0344)  potassium chloride  (KLOR-CON ) packet 60 mEq (60 mEq Oral Given 05/16/24 0141)  acetaminophen  (TYLENOL ) tablet 1,000 mg (1,000 mg Oral Given 05/16/24 0247)  oxyCODONE  (Oxy IR/ROXICODONE ) immediate release tablet 15 mg (15 mg Oral Given 05/16/24 0348)     ED COURSE: Patient's labs show potassium of 2.8, magnesium  of 1.5.  Will give replacement.  No interval changes on EKG.  Patient is on cardiac monitoring.  Alcohol level of 271.  Will monitor until clinically sober.  She is receiving fluids, thiamine.  CTs, x-rays reviewed and interpreted by myself and the radiologist and show no acute traumatic injury.  2:43 AM  Pt's friend is now at bedside and here to take her home.  Patient is very eager for discharge but agrees to stay to have IV potassium finished.  Tolerating p.o. here.  Will discharge with prescription for potassium and recommend close outpatient follow-up.  Patient has been able to  ambulate here without difficulty.  At this time, I do not feel there is any life-threatening condition present. I reviewed all nursing notes, vitals, pertinent previous records.  All lab and urine results, EKGs, imaging ordered have been independently reviewed and interpreted by myself.  I reviewed all available radiology reports from any imaging ordered this visit.  Based on my assessment, I feel the patient is safe to be discharged home without further emergent workup and can continue workup as an outpatient as needed. Discussed all findings, treatment plan as well as usual and customary return precautions.  They verbalize understanding and are comfortable with this plan.  Outpatient follow-up has been provided as needed.  All questions have been answered.  CONSULTS: Admission considered given electrolyte derangement however patient states she wants discharge home.   OUTSIDE RECORDS REVIEWED: Reviewed recent oncology notes.       FINAL CLINICAL IMPRESSION(S) / ED DIAGNOSES   Final diagnoses:  Fall, initial encounter  Alcoholic intoxication without complication  Hypokalemia  Hypomagnesemia     Rx / DC Orders   ED Discharge Orders          Ordered    potassium chloride  SA (KLOR-CON  M) 20 MEQ tablet  2  times daily        05/16/24 0245    ondansetron  (ZOFRAN -ODT) 4 MG disintegrating tablet  Every 6 hours PRN        05/16/24 0245             Note:  This document was prepared using Dragon voice recognition software and may include unintentional dictation errors.   Tolbert Matheson, Josette SAILOR, DO 05/16/24 [phone]

## 2024-05-16 NOTE — ED Triage Notes (Signed)
 BIBA due to fall down some steps from her camper.  Tripped over the dog while walking outside.  ETOH with Vodka loaded.  She was found outside and had been on the ground for about an hour. Hx:  Chronic pain.  Current pain is back and hip  Per EMS:  19 RAC 129/72 81 BG NSR on EKG  Port from cancer not actively used

## 2024-05-18 ENCOUNTER — Other Ambulatory Visit: Payer: Self-pay | Admitting: Cardiovascular Disease

## 2024-05-21 ENCOUNTER — Other Ambulatory Visit: Payer: Self-pay | Admitting: Cardiovascular Disease

## 2024-05-22 ENCOUNTER — Other Ambulatory Visit: Payer: Self-pay | Admitting: Nurse Practitioner

## 2024-05-22 DIAGNOSIS — C20 Malignant neoplasm of rectum: Secondary | ICD-10-CM

## 2024-05-22 DIAGNOSIS — G893 Neoplasm related pain (acute) (chronic): Secondary | ICD-10-CM

## 2024-05-22 DIAGNOSIS — Z515 Encounter for palliative care: Secondary | ICD-10-CM

## 2024-05-22 DIAGNOSIS — M792 Neuralgia and neuritis, unspecified: Secondary | ICD-10-CM

## 2024-05-22 MED ORDER — PREGABALIN 150 MG PO CAPS
150.0000 mg | ORAL_CAPSULE | Freq: Three times a day (TID) | ORAL | 2 refills | Status: DC
Start: 2024-05-22 — End: 2024-06-04

## 2024-05-22 MED ORDER — OXYCODONE HCL 15 MG PO TABS
15.0000 mg | ORAL_TABLET | Freq: Four times a day (QID) | ORAL | 0 refills | Status: DC | PRN
Start: 2024-05-22 — End: 2024-06-04

## 2024-05-26 ENCOUNTER — Other Ambulatory Visit: Payer: Self-pay

## 2024-05-26 ENCOUNTER — Emergency Department

## 2024-05-26 ENCOUNTER — Emergency Department
Admission: EM | Admit: 2024-05-26 | Discharge: 2024-05-26 | Disposition: A | Discharge status: Home / Self Care | Attending: Emergency Medicine | Admitting: Emergency Medicine

## 2024-05-26 DIAGNOSIS — R4182 Altered mental status, unspecified: Secondary | ICD-10-CM | POA: Diagnosis present

## 2024-05-26 DIAGNOSIS — E119 Type 2 diabetes mellitus without complications: Secondary | ICD-10-CM | POA: Diagnosis not present

## 2024-05-26 DIAGNOSIS — R400 Somnolence: Secondary | ICD-10-CM

## 2024-05-26 DIAGNOSIS — J449 Chronic obstructive pulmonary disease, unspecified: Secondary | ICD-10-CM | POA: Diagnosis not present

## 2024-05-26 DIAGNOSIS — I509 Heart failure, unspecified: Secondary | ICD-10-CM | POA: Insufficient documentation

## 2024-05-26 LAB — COMPREHENSIVE METABOLIC PANEL WITH GFR
ALT: 14 U/L (ref 0–44)
AST: 19 U/L (ref 15–41)
Albumin: 3.8 g/dL (ref 3.5–5.0)
Alkaline Phosphatase: 92 U/L (ref 38–126)
Anion gap: 12 (ref 5–15)
BUN: 34 mg/dL — ABNORMAL HIGH (ref 6–20)
CO2: 20 mmol/L — ABNORMAL LOW (ref 22–32)
Calcium: 9.2 mg/dL (ref 8.9–10.3)
Chloride: 100 mmol/L (ref 98–111)
Creatinine, Ser: 1.13 mg/dL — ABNORMAL HIGH (ref 0.44–1.00)
GFR, Estimated: 56 mL/min — ABNORMAL LOW (ref >60)
Glucose, Bld: 118 mg/dL — ABNORMAL HIGH (ref 70–99)
Potassium: 4.6 mmol/L (ref 3.5–5.1)
Sodium: 132 mmol/L — ABNORMAL LOW (ref 135–145)
Total Bilirubin: 1.5 mg/dL — ABNORMAL HIGH (ref 0.0–1.2)
Total Protein: 7.3 g/dL (ref 6.5–8.1)

## 2024-05-26 LAB — ETHANOL: Alcohol, Ethyl (B): <15 mg/dL (ref <15)

## 2024-05-26 LAB — PROTIME-INR
INR: 1 (ref 0.8–1.2)
Prothrombin Time: 13.8 s (ref 11.4–15.2)

## 2024-05-26 LAB — CBC
HCT: 38.5 % (ref 36.0–46.0)
Hemoglobin: 12.7 g/dL (ref 12.0–15.0)
MCH: 29.3 pg (ref 26.0–34.0)
MCHC: 33 g/dL (ref 30.0–36.0)
MCV: 88.9 fL (ref 80.0–100.0)
Platelets: 196 K/uL (ref 150–400)
RBC: 4.33 MIL/uL (ref 3.87–5.11)
RDW: 16.5 % — ABNORMAL HIGH (ref 11.5–15.5)
WBC: 8.6 K/uL (ref 4.0–10.5)
nRBC: 0 % (ref 0.0–0.2)

## 2024-05-26 LAB — CBG MONITORING, ED: Glucose-Capillary: 128 mg/dL — ABNORMAL HIGH (ref 70–99)

## 2024-05-26 LAB — APTT: aPTT: 40 s — ABNORMAL HIGH (ref 24–36)

## 2024-05-26 LAB — LACTIC ACID, PLASMA: Lactic Acid, Venous: 1.2 mmol/L (ref 0.5–1.9)

## 2024-05-26 LAB — TROPONIN I (HIGH SENSITIVITY): Troponin I (High Sensitivity): 5 ng/L (ref <18)

## 2024-05-26 MED ORDER — NALOXONE HCL 2 MG/2ML IJ SOSY
0.4000 mg | PREFILLED_SYRINGE | Freq: Once | INTRAMUSCULAR | Status: AC
  Administered 2024-05-26: 0.4 mg via INTRAVENOUS
  Filled 2024-05-26: qty 2

## 2024-05-26 MED ORDER — SODIUM CHLORIDE 0.9 % IV SOLN: Freq: Once | INTRAVENOUS | Status: AC

## 2024-05-26 NOTE — ED Notes (Signed)
 Pt friend stating pt is everyday drinker, usually vodka. Also pt had several falls last night.

## 2024-05-26 NOTE — ED Notes (Signed)
 Post Narcan  pt is alert and oriented.

## 2024-05-26 NOTE — ED Provider Notes (Signed)
 Procedures     ----------------------------------------- 7:06 PM on 05/26/2024 -----------------------------------------  Patient sleeping when I entered the room, but easily arousable to voice.  Able to engage in conversation, clearly oriented.  GCS is 15, no signs of stroke on exam currently.  MRI is negative.  Suspect sedation from medication side effects.  Recommend she avoid sedative use where possible, follow-up with her doctor this week.    Viviann Pastor, MD 05/26/24 [phone]

## 2024-05-26 NOTE — ED Provider Notes (Signed)
 [facility] Provider Note    Event Date/Time   First MD Initiated Contact with Patient 05/26/24 1253     (approximate)   History   Altered mental status   HPI  Erica [name] is a 58 y.o. female with a history of extensive past medical history including CHF, COPD, diabetes, chronic opioid use, CVA who presents with altered mental status.  Here with friend who states that yesterday around lunchtime she seemed to be more fatigued and dozing off but she attributed to her medications.  Today she went to check on her and found her to be difficult to arouse and again falling asleep frequently.     Physical Exam   Triage Vital Signs: ED Triage Vitals  Encounter Vitals Group     BP 05/26/24 1259 (!) 80/69     Girls Systolic BP Percentile --      Girls Diastolic BP Percentile --      Boys Systolic BP Percentile --      Boys Diastolic BP Percentile --      Pulse Rate 05/26/24 1259 83     Resp 05/26/24 1259 16     Temp 05/26/24 1259 (!) 97.1 F (36.2 C)     Temp Source 05/26/24 1259 Axillary     SpO2 05/26/24 1259 97 %     Weight 05/26/24 1300 65 kg (143 lb 4.8 oz)     Height 05/26/24 1300 1.727 m (5' 8)     Head Circumference --      Peak Flow --      Pain Score 05/26/24 1300 0     Pain Loc --      Pain Education --      Exclude from Growth Chart --     Most recent vital signs: Vitals:   05/26/24 1259 05/26/24 1300  BP: (!) 80/69 118/68  Pulse: 83 82  Resp: 16 16  Temp: (!) 97.1 F (36.2 C)   SpO2: 97% 98%     General: Awakens to stimulation but then quickly falls asleep again CV:  Good peripheral perfusion.  Regular rate and rhythm Resp:  Normal effort.  Clear to auscultation bilaterally Abd:  No distention.  Soft, nontender Other:  Moves all extremities but question right leg weakness compared to left   ED Results / Procedures / Treatments   Labs (all labs ordered are listed, but only abnormal results are displayed) Labs Reviewed   CBC - Abnormal; Notable for the following components:      Result Value   RDW 16.5 (*)    All other components within normal limits  COMPREHENSIVE METABOLIC PANEL WITH GFR - Abnormal; Notable for the following components:   Sodium 132 (*)    CO2 20 (*)    Glucose, Bld 118 (*)    BUN 34 (*)    Creatinine, Ser 1.13 (*)    Total Bilirubin 1.5 (*)    GFR, Estimated 56 (*)    All other components within normal limits  APTT - Abnormal; Notable for the following components:   aPTT 40 (*)    All other components within normal limits  CBG MONITORING, ED - Abnormal; Notable for the following components:   Glucose-Capillary 128 (*)    All other components within normal limits  PROTIME-INR  LACTIC ACID, PLASMA  ETHANOL  URINALYSIS, ROUTINE W REFLEX MICROSCOPIC  LACTIC ACID, PLASMA  TROPONIN I (HIGH SENSITIVITY)     EKG  ED ECG REPORT I, Lamar  Deveion Denz, the attending physician, personally viewed and interpreted this ECG.  Date: 05/26/2024  Rhythm: normal sinus rhythm QRS Axis: normal Intervals: normal ST/T Wave abnormalities: normal Narrative Interpretation: no evidence of acute ischemia    RADIOLOGY Chest x-ray view interpret by me, no acute abnormality    PROCEDURES:  Critical Care performed: yes  CRITICAL CARE Performed by: Lamar Price   Total critical care time: 30 minutes  Critical care time was exclusive of separately billable procedures and treating other patients.  Critical care was necessary to treat or prevent imminent or life-threatening deterioration.  Critical care was time spent personally by me on the following activities: development of treatment plan with patient and/or surrogate as well as nursing, discussions with consultants, evaluation of patient's response to treatment, examination of patient, obtaining history from patient or surrogate, ordering and performing treatments and interventions, ordering and review of laboratory studies, ordering  and review of radiographic studies, pulse oximetry and re-evaluation of patient's condition.   Procedures   MEDICATIONS ORDERED IN ED: Medications  naloxone  (NARCAN ) injection 0.4 mg (0.4 mg Intravenous Given 05/26/24 1309)  0.9 %  sodium chloride  infusion ( Intravenous New Bag/Given 05/26/24 1310)     IMPRESSION / MDM / ASSESSMENT AND PLAN / ED COURSE  I reviewed the triage vital signs and the nursing notes. Patient's presentation is most consistent with acute presentation with potential threat to life or bodily function.  Patient presents with altered mental status as detailed above with significant comorbidities.  Differential includes CVA, intracranial hemorrhage, accidental medication overdose, electrolyte abnormalities, sepsis  Will obtain labs, send for CT head, obtain chest x-ray.  Will give a low-dose of Narcan  given the possibility of accidental opioid overdose  Nurse reports patient had rapid improvement after Narcan   Lab work is returning and overall unremarkable.  CT head and chest x-ray are normal.  Given possible right leg weakness will send for MRI brain  Will monitor in the emergency department to ensure that she remains alert and oriented ----------------------------------------- 2:47 PM on 05/26/2024 ----------------------------------------- Patient sleeping on recheck, easily arousable will hold off on additional Narcan  at this time pending MRI  Have asked my colleague to follow-up on imaging     FINAL CLINICAL IMPRESSION(S) / ED DIAGNOSES   Final diagnoses:  Altered mental status, unspecified altered mental status type     Rx / DC Orders   ED Discharge Orders     None        Note:  This document was prepared using Dragon voice recognition software and may include unintentional dictation errors.   Price Lamar, MD 05/26/24 [phone]

## 2024-05-26 NOTE — ED Notes (Signed)
 Pt brought by family friend for weakness, leaning to R side and AMS since yesterday.

## 2024-05-26 NOTE — Discharge Instructions (Signed)
 Your lab tests, CT scan of the brain, and MRI of the brain are all okay today.  There is no sign of a stroke or infection.  Continue taking your medications and follow-up with your doctor this week.

## 2024-05-26 NOTE — ED Triage Notes (Signed)
 Pt BIB friend for possible stroke. Friend states symptoms started yesterday a little after lunch, pt having weakness, and leaning to right side. Friend states pt was lethargic yesterday as well, wasn't acting like normal self. Pt states has chronic pain but no pain currently. Pt wheeled in wheelchair to room. Pt sleeping in between questions.

## 2024-05-28 ENCOUNTER — Inpatient Hospital Stay: Attending: Oncology | Admitting: Nurse Practitioner

## 2024-05-31 ENCOUNTER — Emergency Department

## 2024-05-31 ENCOUNTER — Inpatient Hospital Stay
Admission: EM | Admit: 2024-05-31 | Discharge: 2024-06-04 | DRG: 917 | Disposition: A | Discharge status: Home Health | Attending: Emergency Medicine | Admitting: Emergency Medicine

## 2024-05-31 ENCOUNTER — Encounter: Payer: Self-pay | Admitting: Emergency Medicine

## 2024-05-31 ENCOUNTER — Other Ambulatory Visit: Payer: Self-pay

## 2024-05-31 DIAGNOSIS — F1010 Alcohol abuse, uncomplicated: Secondary | ICD-10-CM | POA: Diagnosis present

## 2024-05-31 DIAGNOSIS — I5032 Chronic diastolic (congestive) heart failure: Secondary | ICD-10-CM | POA: Diagnosis present

## 2024-05-31 DIAGNOSIS — F419 Anxiety disorder, unspecified: Secondary | ICD-10-CM | POA: Diagnosis present

## 2024-05-31 DIAGNOSIS — Z59869 Financial insecurity, unspecified: Secondary | ICD-10-CM

## 2024-05-31 DIAGNOSIS — K760 Fatty (change of) liver, not elsewhere classified: Secondary | ICD-10-CM | POA: Diagnosis present

## 2024-05-31 DIAGNOSIS — R400 Somnolence: Secondary | ICD-10-CM

## 2024-05-31 DIAGNOSIS — Z9049 Acquired absence of other specified parts of digestive tract: Secondary | ICD-10-CM

## 2024-05-31 DIAGNOSIS — R404 Transient alteration of awareness: Secondary | ICD-10-CM

## 2024-05-31 DIAGNOSIS — Z7951 Long term (current) use of inhaled steroids: Secondary | ICD-10-CM

## 2024-05-31 DIAGNOSIS — Z833 Family history of diabetes mellitus: Secondary | ICD-10-CM

## 2024-05-31 DIAGNOSIS — I110 Hypertensive heart disease with heart failure: Secondary | ICD-10-CM | POA: Diagnosis present

## 2024-05-31 DIAGNOSIS — Z801 Family history of malignant neoplasm of trachea, bronchus and lung: Secondary | ICD-10-CM

## 2024-05-31 DIAGNOSIS — N179 Acute kidney failure, unspecified: Secondary | ICD-10-CM | POA: Diagnosis present

## 2024-05-31 DIAGNOSIS — A419 Sepsis, unspecified organism: Secondary | ICD-10-CM

## 2024-05-31 DIAGNOSIS — E872 Acidosis, unspecified: Secondary | ICD-10-CM | POA: Diagnosis present

## 2024-05-31 DIAGNOSIS — Z59819 Housing instability, housed unspecified: Secondary | ICD-10-CM | POA: Diagnosis not present

## 2024-05-31 DIAGNOSIS — F17210 Nicotine dependence, cigarettes, uncomplicated: Secondary | ICD-10-CM | POA: Diagnosis present

## 2024-05-31 DIAGNOSIS — Z22322 Carrier or suspected carrier of Methicillin resistant Staphylococcus aureus: Secondary | ICD-10-CM

## 2024-05-31 DIAGNOSIS — R4182 Altered mental status, unspecified: Secondary | ICD-10-CM | POA: Diagnosis present

## 2024-05-31 DIAGNOSIS — Z7141 Alcohol abuse counseling and surveillance of alcoholic: Secondary | ICD-10-CM

## 2024-05-31 DIAGNOSIS — J9611 Chronic respiratory failure with hypoxia: Secondary | ICD-10-CM | POA: Diagnosis present

## 2024-05-31 DIAGNOSIS — G893 Neoplasm related pain (acute) (chronic): Secondary | ICD-10-CM | POA: Diagnosis present

## 2024-05-31 DIAGNOSIS — M792 Neuralgia and neuritis, unspecified: Secondary | ICD-10-CM

## 2024-05-31 DIAGNOSIS — Z1152 Encounter for screening for COVID-19: Secondary | ICD-10-CM

## 2024-05-31 DIAGNOSIS — F32A Depression, unspecified: Secondary | ICD-10-CM | POA: Diagnosis present

## 2024-05-31 DIAGNOSIS — E785 Hyperlipidemia, unspecified: Secondary | ICD-10-CM | POA: Diagnosis present

## 2024-05-31 DIAGNOSIS — Z79891 Long term (current) use of opiate analgesic: Secondary | ICD-10-CM

## 2024-05-31 DIAGNOSIS — T380X5A Adverse effect of glucocorticoids and synthetic analogues, initial encounter: Secondary | ICD-10-CM | POA: Diagnosis present

## 2024-05-31 DIAGNOSIS — J189 Pneumonia, unspecified organism: Secondary | ICD-10-CM | POA: Diagnosis present

## 2024-05-31 DIAGNOSIS — Z23 Encounter for immunization: Secondary | ICD-10-CM

## 2024-05-31 DIAGNOSIS — J690 Pneumonitis due to inhalation of food and vomit: Principal | ICD-10-CM

## 2024-05-31 DIAGNOSIS — E876 Hypokalemia: Secondary | ICD-10-CM | POA: Diagnosis present

## 2024-05-31 DIAGNOSIS — T402X1A Poisoning by other opioids, accidental (unintentional), initial encounter: Principal | ICD-10-CM | POA: Diagnosis present

## 2024-05-31 DIAGNOSIS — C7802 Secondary malignant neoplasm of left lung: Secondary | ICD-10-CM | POA: Diagnosis present

## 2024-05-31 DIAGNOSIS — Z515 Encounter for palliative care: Secondary | ICD-10-CM

## 2024-05-31 DIAGNOSIS — Z79899 Other long term (current) drug therapy: Secondary | ICD-10-CM

## 2024-05-31 DIAGNOSIS — E1165 Type 2 diabetes mellitus with hyperglycemia: Secondary | ICD-10-CM | POA: Diagnosis present

## 2024-05-31 DIAGNOSIS — G9341 Metabolic encephalopathy: Secondary | ICD-10-CM | POA: Diagnosis present

## 2024-05-31 DIAGNOSIS — J440 Chronic obstructive pulmonary disease with acute lower respiratory infection: Secondary | ICD-10-CM | POA: Diagnosis present

## 2024-05-31 DIAGNOSIS — Z880 Allergy status to penicillin: Secondary | ICD-10-CM

## 2024-05-31 DIAGNOSIS — C20 Malignant neoplasm of rectum: Secondary | ICD-10-CM | POA: Diagnosis present

## 2024-05-31 DIAGNOSIS — Z8673 Personal history of transient ischemic attack (TIA), and cerebral infarction without residual deficits: Secondary | ICD-10-CM

## 2024-05-31 DIAGNOSIS — G4733 Obstructive sleep apnea (adult) (pediatric): Secondary | ICD-10-CM | POA: Diagnosis present

## 2024-05-31 DIAGNOSIS — X58XXXA Exposure to other specified factors, initial encounter: Secondary | ICD-10-CM | POA: Diagnosis present

## 2024-05-31 DIAGNOSIS — Z7984 Long term (current) use of oral hypoglycemic drugs: Secondary | ICD-10-CM

## 2024-05-31 DIAGNOSIS — I959 Hypotension, unspecified: Secondary | ICD-10-CM | POA: Diagnosis present

## 2024-05-31 DIAGNOSIS — Z9981 Dependence on supplemental oxygen: Secondary | ICD-10-CM

## 2024-05-31 DIAGNOSIS — Z9221 Personal history of antineoplastic chemotherapy: Secondary | ICD-10-CM

## 2024-05-31 DIAGNOSIS — Z888 Allergy status to other drugs, medicaments and biological substances status: Secondary | ICD-10-CM

## 2024-05-31 DIAGNOSIS — Z800 Family history of malignant neoplasm of digestive organs: Secondary | ICD-10-CM

## 2024-05-31 LAB — CBG MONITORING, ED: Glucose-Capillary: 158 mg/dL — ABNORMAL HIGH (ref 70–99)

## 2024-05-31 LAB — CBC
HCT: 32.4 % — ABNORMAL LOW (ref 36.0–46.0)
Hemoglobin: 10.7 g/dL — ABNORMAL LOW (ref 12.0–15.0)
MCH: 29.6 pg (ref 26.0–34.0)
MCHC: 33 g/dL (ref 30.0–36.0)
MCV: 89.8 fL (ref 80.0–100.0)
Platelets: 158 K/uL (ref 150–400)
RBC: 3.61 MIL/uL — ABNORMAL LOW (ref 3.87–5.11)
RDW: 18.1 % — ABNORMAL HIGH (ref 11.5–15.5)
WBC: 6.7 K/uL (ref 4.0–10.5)
nRBC: 0 % (ref 0.0–0.2)

## 2024-05-31 LAB — COMPREHENSIVE METABOLIC PANEL WITH GFR
ALT: 33 U/L (ref 0–44)
AST: 61 U/L — ABNORMAL HIGH (ref 15–41)
Albumin: 3.1 g/dL — ABNORMAL LOW (ref 3.5–5.0)
Alkaline Phosphatase: 91 U/L (ref 38–126)
Anion gap: 17 — ABNORMAL HIGH (ref 5–15)
BUN: 28 mg/dL — ABNORMAL HIGH (ref 6–20)
CO2: 17 mmol/L — ABNORMAL LOW (ref 22–32)
Calcium: 8.6 mg/dL — ABNORMAL LOW (ref 8.9–10.3)
Chloride: 97 mmol/L — ABNORMAL LOW (ref 98–111)
Creatinine, Ser: 1.41 mg/dL — ABNORMAL HIGH (ref 0.44–1.00)
GFR, Estimated: 43 mL/min — ABNORMAL LOW (ref >60)
Glucose, Bld: 160 mg/dL — ABNORMAL HIGH (ref 70–99)
Potassium: 4.2 mmol/L (ref 3.5–5.1)
Sodium: 131 mmol/L — ABNORMAL LOW (ref 135–145)
Total Bilirubin: 1.6 mg/dL — ABNORMAL HIGH (ref 0.0–1.2)
Total Protein: 6.5 g/dL (ref 6.5–8.1)

## 2024-05-31 LAB — TROPONIN T, HIGH SENSITIVITY
Troponin T High Sensitivity: 71 ng/L — ABNORMAL HIGH (ref 0–19)
Troponin T High Sensitivity: 53 ng/L — ABNORMAL HIGH (ref 0–19)

## 2024-05-31 LAB — GLUCOSE, CAPILLARY: Glucose-Capillary: 135 mg/dL — ABNORMAL HIGH (ref 70–99)

## 2024-05-31 LAB — LACTIC ACID, PLASMA
Lactic Acid, Venous: 2 mmol/L (ref 0.5–1.9)
Lactic Acid, Venous: 3.2 mmol/L (ref 0.5–1.9)
Lactic Acid, Venous: 2.3 mmol/L (ref 0.5–1.9)

## 2024-05-31 LAB — RESP PANEL BY RT-PCR (RSV, FLU A&B, COVID)  RVPGX2
Influenza A by PCR: NEGATIVE
Influenza B by PCR: NEGATIVE
Resp Syncytial Virus by PCR: NEGATIVE
SARS Coronavirus 2 by RT PCR: NEGATIVE

## 2024-05-31 LAB — ACETAMINOPHEN LEVEL: Acetaminophen (Tylenol), Serum: <10 ug/mL — ABNORMAL LOW (ref 10–30)

## 2024-05-31 LAB — MRSA NEXT GEN BY PCR, NASAL: MRSA by PCR Next Gen: DETECTED — AB

## 2024-05-31 LAB — HEMOGLOBIN A1C
Hgb A1c MFr Bld: 5.2 % (ref 4.8–5.6)
Mean Plasma Glucose: 102.54 mg/dL

## 2024-05-31 LAB — PROTIME-INR
INR: 1.1 (ref 0.8–1.2)
Prothrombin Time: 14.6 s (ref 11.4–15.2)

## 2024-05-31 LAB — ETHANOL: Alcohol, Ethyl (B): <15 mg/dL (ref <15)

## 2024-05-31 LAB — SALICYLATE LEVEL: Salicylate Lvl: <7 mg/dL — ABNORMAL LOW (ref 7.0–30.0)

## 2024-05-31 LAB — TSH: TSH: 0.313 u[IU]/mL — ABNORMAL LOW (ref 0.350–4.500)

## 2024-05-31 LAB — CK: Total CK: 236 U/L — ABNORMAL HIGH (ref 38–234)

## 2024-05-31 LAB — AMMONIA: Ammonia: <13 umol/L (ref 9–35)

## 2024-05-31 MED ORDER — IPRATROPIUM-ALBUTEROL 0.5-2.5 (3) MG/3ML IN SOLN
3.0000 mL | Freq: Four times a day (QID) | RESPIRATORY_TRACT | Status: DC
  Administered 2024-05-31: 3 mL via RESPIRATORY_TRACT
  Filled 2024-05-31: qty 3

## 2024-05-31 MED ORDER — LACTATED RINGERS IV BOLUS
500.0000 mL | Freq: Once | INTRAVENOUS | Status: AC
  Administered 2024-05-31: 500 mL via INTRAVENOUS

## 2024-05-31 MED ORDER — ADULT MULTIVITAMIN W/MINERALS CH
1.0000 | ORAL_TABLET | Freq: Every day | ORAL | Status: DC
  Administered 2024-05-31 – 2024-06-04 (×5): 1 via ORAL
  Filled 2024-05-31 (×5): qty 1

## 2024-05-31 MED ORDER — LACTATED RINGERS IV BOLUS
1000.0000 mL | Freq: Once | INTRAVENOUS | Status: AC
  Administered 2024-05-31: 1000 mL via INTRAVENOUS

## 2024-05-31 MED ORDER — SODIUM CHLORIDE 0.9 % IV BOLUS
500.0000 mL | Freq: Once | INTRAVENOUS | Status: AC
  Administered 2024-05-31: 500 mL via INTRAVENOUS

## 2024-05-31 MED ORDER — LORAZEPAM 2 MG/ML IJ SOLN: 1.0000 mg | INTRAMUSCULAR | Status: AC | PRN

## 2024-05-31 MED ORDER — SODIUM CHLORIDE 0.9 % IV SOLN
500.0000 mg | INTRAVENOUS | Status: AC
  Administered 2024-05-31 – 2024-06-02 (×3): 500 mg via INTRAVENOUS
  Filled 2024-05-31 (×3): qty 5

## 2024-05-31 MED ORDER — DULOXETINE HCL 30 MG PO CPEP
60.0000 mg | ORAL_CAPSULE | Freq: Two times a day (BID) | ORAL | Status: DC
  Administered 2024-05-31 – 2024-06-04 (×8): 60 mg via ORAL
  Filled 2024-05-31 (×8): qty 2

## 2024-05-31 MED ORDER — FOLIC ACID 1 MG PO TABS
1.0000 mg | ORAL_TABLET | Freq: Every day | ORAL | Status: DC
  Administered 2024-05-31 – 2024-06-04 (×5): 1 mg via ORAL
  Filled 2024-05-31 (×5): qty 1

## 2024-05-31 MED ORDER — METHYLPREDNISOLONE SODIUM SUCC 125 MG IJ SOLR
125.0000 mg | Freq: Once | INTRAMUSCULAR | Status: AC
  Administered 2024-05-31: 125 mg via INTRAVENOUS
  Filled 2024-05-31: qty 2

## 2024-05-31 MED ORDER — THIAMINE MONONITRATE 100 MG PO TABS
100.0000 mg | ORAL_TABLET | Freq: Every day | ORAL | Status: DC
  Administered 2024-05-31 – 2024-06-04 (×5): 100 mg via ORAL
  Filled 2024-05-31 (×5): qty 1

## 2024-05-31 MED ORDER — METRONIDAZOLE 500 MG/100ML IV SOLN
500.0000 mg | Freq: Two times a day (BID) | INTRAVENOUS | Status: DC
  Administered 2024-05-31 – 2024-06-04 (×8): 500 mg via INTRAVENOUS
  Filled 2024-05-31 (×9): qty 100

## 2024-05-31 MED ORDER — HEPARIN SODIUM (PORCINE) 5000 UNIT/ML IJ SOLN
5000.0000 [IU] | Freq: Three times a day (TID) | INTRAMUSCULAR | Status: DC
  Administered 2024-05-31 – 2024-06-04 (×11): 5000 [IU] via SUBCUTANEOUS
  Filled 2024-05-31 (×11): qty 1

## 2024-05-31 MED ORDER — SALINE SPRAY 0.65 % NA SOLN: 1.0000 | NASAL | Status: DC | PRN

## 2024-05-31 MED ORDER — ALPRAZOLAM 0.25 MG PO TABS
0.2500 mg | ORAL_TABLET | Freq: Every day | ORAL | Status: DC | PRN
Start: 2024-05-31 — End: 2024-06-04
  Administered 2024-06-03: 0.25 mg via ORAL
  Filled 2024-05-31: qty 1

## 2024-05-31 MED ORDER — HYDRALAZINE HCL 20 MG/ML IJ SOLN: 10.0000 mg | Freq: Four times a day (QID) | INTRAMUSCULAR | Status: DC | PRN

## 2024-05-31 MED ORDER — FLUTICASONE FUROATE-VILANTEROL 200-25 MCG/ACT IN AEPB
1.0000 | INHALATION_SPRAY | Freq: Every day | RESPIRATORY_TRACT | Status: DC
  Administered 2024-05-31 – 2024-06-04 (×4): 1 via RESPIRATORY_TRACT
  Filled 2024-05-31 (×2): qty 28

## 2024-05-31 MED ORDER — LABETALOL HCL 5 MG/ML IV SOLN: 10.0000 mg | INTRAVENOUS | Status: DC | PRN

## 2024-05-31 MED ORDER — ENSURE PLUS HIGH PROTEIN PO LIQD
237.0000 mL | Freq: Two times a day (BID) | ORAL | Status: DC
  Administered 2024-06-02 – 2024-06-04 (×6): 237 mL via ORAL

## 2024-05-31 MED ORDER — PANTOPRAZOLE SODIUM 40 MG PO TBEC
40.0000 mg | DELAYED_RELEASE_TABLET | Freq: Every day | ORAL | Status: DC
  Administered 2024-05-31 – 2024-06-04 (×5): 40 mg via ORAL
  Filled 2024-05-31 (×5): qty 1

## 2024-05-31 MED ORDER — ACETAMINOPHEN 325 MG PO TABS: 650.0000 mg | ORAL_TABLET | Freq: Four times a day (QID) | ORAL | Status: DC | PRN

## 2024-05-31 MED ORDER — LORAZEPAM 1 MG PO TABS: 1.0000 mg | ORAL_TABLET | ORAL | Status: AC | PRN

## 2024-05-31 MED ORDER — IPRATROPIUM-ALBUTEROL 0.5-2.5 (3) MG/3ML IN SOLN
3.0000 mL | Freq: Three times a day (TID) | RESPIRATORY_TRACT | Status: DC
  Administered 2024-06-01 – 2024-06-02 (×4): 3 mL via RESPIRATORY_TRACT
  Filled 2024-05-31 (×4): qty 3

## 2024-05-31 MED ORDER — NALOXONE HCL 2 MG/2ML IJ SOSY
0.4000 mg | PREFILLED_SYRINGE | INTRAMUSCULAR | Status: AC
  Administered 2024-05-31: 0.4 mg via INTRAVENOUS
  Filled 2024-05-31: qty 2

## 2024-05-31 MED ORDER — IPRATROPIUM-ALBUTEROL 0.5-2.5 (3) MG/3ML IN SOLN
3.0000 mL | Freq: Once | RESPIRATORY_TRACT | Status: AC
  Administered 2024-05-31: 3 mL via RESPIRATORY_TRACT
  Filled 2024-05-31: qty 3

## 2024-05-31 MED ORDER — MUSCLE RUB 10-15 % EX CREA: 1.0000 | TOPICAL_CREAM | CUTANEOUS | Status: DC | PRN

## 2024-05-31 MED ORDER — BUSPIRONE HCL 10 MG PO TABS
30.0000 mg | ORAL_TABLET | Freq: Two times a day (BID) | ORAL | Status: DC
  Administered 2024-05-31 – 2024-06-04 (×8): 30 mg via ORAL
  Filled 2024-05-31 (×9): qty 3

## 2024-05-31 MED ORDER — OXYCODONE HCL 5 MG PO TABS: 10.0000 mg | ORAL_TABLET | Freq: Three times a day (TID) | ORAL | Status: DC | PRN

## 2024-05-31 MED ORDER — THIAMINE HCL 100 MG/ML IJ SOLN
100.0000 mg | Freq: Every day | INTRAMUSCULAR | Status: DC
  Filled 2024-05-31: qty 2

## 2024-05-31 MED ORDER — SODIUM CHLORIDE 0.9 % IV SOLN
1.0000 g | Freq: Once | INTRAVENOUS | Status: AC
  Administered 2024-05-31: 1 g via INTRAVENOUS
  Filled 2024-05-31: qty 20

## 2024-05-31 MED ORDER — SODIUM CHLORIDE 0.9 % IV SOLN
2.0000 g | INTRAVENOUS | Status: DC
  Administered 2024-05-31 – 2024-06-03 (×4): 2 g via INTRAVENOUS
  Filled 2024-05-31 (×5): qty 20

## 2024-05-31 MED ORDER — PREDNISONE 50 MG PO TABS
50.0000 mg | ORAL_TABLET | Freq: Every day | ORAL | Status: DC
  Administered 2024-06-01 – 2024-06-04 (×4): 50 mg via ORAL
  Filled 2024-05-31 (×4): qty 1

## 2024-05-31 MED ORDER — DILTIAZEM HCL ER COATED BEADS 120 MG PO CP24
120.0000 mg | ORAL_CAPSULE | Freq: Every day | ORAL | Status: DC
  Administered 2024-05-31: 120 mg via ORAL
  Filled 2024-05-31 (×2): qty 1

## 2024-05-31 MED ORDER — INSULIN ASPART 100 UNIT/ML IJ SOLN
0.0000 [IU] | Freq: Three times a day (TID) | INTRAMUSCULAR | Status: DC
  Administered 2024-06-01: 2 [IU] via SUBCUTANEOUS
  Administered 2024-06-02 (×2): 1 [IU] via SUBCUTANEOUS
  Filled 2024-05-31: qty 1
  Filled 2024-05-31: qty 3

## 2024-05-31 NOTE — Consult Note (Signed)
 CODE SEPSIS - PHARMACY COMMUNICATION  **Broad Spectrum Antibiotics should be administered within 1 hour of Sepsis diagnosis**  Time Code Sepsis Called/Page Received: 1326  Antibiotics Ordered: Meropenem  1G x1  Time of 1st antibiotic administration: 1332  Additional action taken by pharmacy: none  If necessary, Name of Provider/Nurse Contacted: n/a    Annabella LOISE Banks ,PharmD Clinical Pharmacist  05/31/2024  1:32 PM

## 2024-05-31 NOTE — Consult Note (Signed)
 [facility] Psychiatric Consult Initial  Patient Name: .Erica [name]  MRN: [id number]  DOB: 01/08/1966  Consult Order details:  Orders (From admission, onward)     Start     Ordered   05/31/24 1031  IP CONSULT TO PSYCHIATRY       Ordering Provider: Dicky Anes, MD  Provider:  (Not yet assigned)  Question Answer Comment  Reason for consult: Other (see comments)   Comments: Suspected unknown intent of potential overdose, polypharmacy      05/31/24 1030   05/31/24 1031  CONSULT TO CALL ACT TEAM       Ordering Provider: Dicky Anes, MD  Provider:  (Not yet assigned)  Question:  Reason for Consult?  Answer:  Suspected overdose or polypharmacy of unknown intent   05/31/24 1030             Mode of Visit: In person    Psychiatry Consult Evaluation  Service Date: May 31, 2024 LOS:  LOS: 0 days  Chief Complaint I'm not sure  Primary Psychiatric Diagnoses  Altered mental status   Assessment  Erica [name] is a 58 y.o. female admitted: Presented to the ED  Psychiatry was consulted by EDP.  Patient was noted to be lethargic and falling asleep during portions of exam.  Due to this exam was limited.  On exam today, patient denied suicidal or homicidal ideations.  Patient reported taking only the prescribed amount of her medications.  She denied taking any excess of any of her medications.  Patient denied suicidal ideation to multiple staff members per ED nurse.  Patient denied any history of self-harm or suicide attempts in the recent or distant past.  Patient denied any previous inpatient psychiatric admissions.  Patient denied auditory or visual hallucinations as well.On current presentation there was no evidence of psychosis or mania and patient did not appear to be responding to internal stimuli. At this time, patient does not appear to be a risk to self or others. Patient is being medically admitted to the hospital for further workup.  Psychiatry team attempted to get in  contact with patient's daughter.  Voicemail was full and psychiatry was unable to leave voicemail for return call.  At this time, patient has denied that potential overdose was intentional.  Patient has maintained safe behaviors in the emergency department.While future psychiatric events cannot be accurately predicted, the patient does not currently require acute inpatient psychiatric care and does not currently meet   involuntary commitment criteria. Risk assessment is a dynamic process; it is possible that this patient's condition, and risk level, may change. This should be re-evaluated and managed over time as appropriate. Please re-consult psychiatric consult services if additional assistance is needed in terms of risk assessment and management.  We do recommend involving patient's established palliative care team as they manage many of patient's current prescriptions.  Palliative care team could provide insight on potential polypharmacy and how best to manage medications as they are the ones primarily involved with patient care.    Diagnoses:  Active Hospital problems: Principal Problem:   Altered mental status    Plan   ## Psychiatric Medication Recommendations:  Defer to medical team no medication recommendations at this time  ## Medical Decision Making Capacity: Not specifically addressed in this encounter  ## Further Work-up:   -- most recent EKG on 05/31/2024 had QtC of 482 -- Pertinent labwork reviewed earlier this admission includes: CBC, ethanol, CMP, acetaminophen  level, salicylate level, urine drug screen pending   ##  Disposition:-- There are no psychiatric contraindications to discharge at this time  ## Behavioral / Environmental: - No specific recommendations at this time.     ## Safety and Observation Level:  - Based on my clinical evaluation, I estimate the patient to be at low risk of self harm in the current setting. - At this time, we recommend   routine. This decision is based on my review of the chart including patient's history and current presentation, interview of the patient, mental status examination, and consideration of suicide risk including evaluating suicidal ideation, plan, intent, suicidal or self-harm behaviors, risk factors, and protective factors. This judgment is based on our ability to directly address suicide risk, implement suicide prevention strategies, and develop a safety plan while the patient is in the clinical setting. Please contact our team if there is a concern that risk level has changed.  CSSR Risk Category:C-SSRS RISK CATEGORY: No Risk  Suicide Risk Assessment: Patient has following modifiable risk factors for suicide: pain, medical illness (ie new dx of cancer), which we are addressing by medical team is admitting patient for further workup of medical needs. Patient has following non-modifiable or demographic risk factors for suicide: N/A Patient has the following protective factors against suicide: Supportive family, no history of suicide attempts, and no history of NSSIB  Thank you for this consult request. Recommendations have been communicated to the primary team.  We will sign off at this time.   Zelda Sharps, NP       History of Present Illness  Relevant Aspects of Hospital ED   Patient Report:  Psychiatry was consulted by EDP.  Patient was noted to be lethargic and falling asleep during portions of exam.  Due to this exam was limited.  On exam today, patient denied suicidal or homicidal ideations.  Patient reported taking only the prescribed amount of her medications.  She denied taking any excess of any of her medications.  Patient denied suicidal ideation to multiple staff members per ED nurse.  Patient denied any history of self-harm or suicide attempts in the recent or distant past.  Patient denied any previous inpatient psychiatric admissions.  Patient denied auditory or visual hallucinations as  well.On current presentation there was no evidence of psychosis or mania and patient did not appear to be responding to internal stimuli. At this time, patient does not appear to be a risk to self or others.  Patient's current symptoms seem to align more closely with polypharmacy.  Patient is being medically admitted to the hospital for further workup.  Psychiatry team attempted to get in contact with patient's daughter.  Voicemail was full and psychiatry was unable to leave voicemail for return call.  At this time, patient has denied that potential overdose was intentional.  Patient has maintained safe behaviors in the emergency department.While future psychiatric events cannot be accurately predicted, the patient does not currently require acute inpatient psychiatric care and does not currently meet [place]  involuntary commitment criteria. Risk assessment is a dynamic process; it is possible that this patient's condition, and risk level, may change. This should be re-evaluated and managed over time as appropriate. Please re-consult psychiatric consult services if additional assistance is needed in terms of risk assessment and management.   Patient did report currently being followed by [facility] for her cancer diagnosis.  She reported her hospice and palliative care team was also through [facility].  Psych ROS:  Depression: Denied Anxiety: Denied Mania (lifetime and current): Denied Psychosis: (lifetime  and current): Denied  Collateral information:  Unable to get in contact with patient's daughter [employer].  Unable to leave voicemail due to voicemail box being full    Psychiatric and Social History  Psychiatric History:  Information collected from patient and chart review  Prev Dx/Sx: Denied Current Psych Provider: None Home Meds (current): Multiple medications prescribed by hospice team for symptom stabilization Previous Med Trials: None Therapy: None currently but aligned  with hospice and palliative care team  Prior Psych Hospitalization: Denied Prior Self Harm: Denied Prior Violence: Denied  Family Psych History: Denied Family Hx suicide: Denied  Social History:   Legal Hx: Denied Living Situation: Patient reported at home, unsure if she lives alone or with daughters Spiritual Hx: Unknown Access to weapons/lethal means: Denied    Exam Findings  Physical Exam: Deferred to EDP- note reviewed   Vital Signs:  Temp:  [98.3 F (36.8 C)] 98.3 F (36.8 C) (11/13 1020) Pulse Rate:  [35-119] 86 (11/13 1530) Resp:  [13-27] 27 (11/13 1530) BP: (84-130)/(46-73) 108/69 (11/13 1530) SpO2:  [91 %-99 %] 94 % (11/13 1530) Weight:  [65 kg] 65 kg (11/13 1023) Blood pressure 108/69, pulse 86, temperature 98.3 F (36.8 C), resp. rate (!) 27, height 5' 8 (1.727 m), weight 65 kg, last menstrual period 03/13/2015, SpO2 94%. Body mass index is 21.79 kg/m.    Mental Status Exam: General Appearance: Disheveled  Orientation:  Full (Time, Place, and Person)  Memory:  Immediate;   Fair Recent;   Poor Remote;   Fair  Concentration:  Concentration: Poor  Recall:  Poor  Attention  Poor  Eye Contact:  Minimal  Speech:  Slurred  Language:  Fair  Volume:  Decreased  Mood: euthymic  Affect:  Congruent  Thought Process:  Coherent  Thought Content:  Logical  Suicidal Thoughts:  No  Homicidal Thoughts:  No  Judgement:  Present- UTA fully due to current AMS  Insight:  Present  Psychomotor Activity:  Normal  Akathisia:  No  Fund of Knowledge:  Fair      Assets:  Solicitor Social Support  Cognition:  Impaired,  Moderate  ADL's:  Impaired  AIMS (if indicated):        Other History   These have been pulled in through the EMR, reviewed, and updated if appropriate.  Family History:  The patient's family history includes Asthma in an other family member; Bone cancer in her father; Brain cancer in her father;  Breast cancer in her mother; Cancer in her maternal uncle; Colon cancer in her maternal aunt; Diabetes in her maternal grandmother; Lung cancer in her father.  Medical History: Past Medical History:  Diagnosis Date   (HFpEF) heart failure with preserved ejection fraction (HCC)    a. 05/2015 Echo: EF 60-65%, no rwma, PASP .   Acute pancreatitis 08/13/2018   Anxiety    Arthritis    Asthma    Bell's palsy    no deficit   Bronchitis    Cancer (HCC) 12/2022   rectal cancer   CHF (congestive heart failure) (HCC)    COPD (chronic obstructive pulmonary disease) (HCC)    Depression    Diabetes mellitus without complication (HCC)    type II 03/2017   Dyspnea    Fatty liver    per patient   Gall stones    per patient   Gastric ulcer    Hyperlipidemia    Hypertension    Lung nodule 12/2022  upper left   OSA (obstructive sleep apnea)    a. did not tolerate CPAP. On oxygen 2L via [place]   Pancreatitis    07/2018   Shoulder injury    6/19   Smoker     Surgical History: Past Surgical History:  Procedure Laterality Date   BRONCHIAL NEEDLE ASPIRATION BIOPSY  01/27/2023   Procedure: BRONCHIAL NEEDLE ASPIRATION BIOPSIES;  Surgeon: Isadora Hose, MD;  Location: [facility];  Service: Pulmonary;;   COLONOSCOPY WITH PROPOFOL  N/A 12/15/2022   Procedure: COLONOSCOPY WITH PROPOFOL ;  Surgeon: Therisa Bi, MD;  Location: [facility];  Service: Gastroenterology;  Laterality: N/A;   COLONOSCOPY WITH PROPOFOL  N/A 12/16/2022   Procedure: COLONOSCOPY WITH PROPOFOL ;  Surgeon: Therisa Bi, MD;  Location: [facility]COPY;  Service: Gastroenterology;  Laterality: N/A;   FLEXIBLE BRONCHOSCOPY N/A 06/20/2015   Procedure: FLEXIBLE BRONCHOSCOPY;  Surgeon: Laurance Flor, MD;  Location: [facility] ORS;  Service: Pulmonary;  Laterality: N/A;   IR EXCHANGE BILIARY DRAIN  01/19/2024   IR IMAGING GUIDED PORT INSERTION  02/25/2023   IR PERC CHOLECYSTOSTOMY  11/25/2023   IR REMOVAL BILIARY DRAIN  02/02/2024   KNEE  SURGERY Left    8 knee surgeries     Medications:  No current facility-administered medications for this encounter.  Current Outpatient Medications:    albuterol  (VENTOLIN  HFA) 108 (90 Base) MCG/ACT inhaler, Inhale 2 puffs into the lungs every 6 (six) hours as needed for wheezing or shortness of breath. , Disp: , Rfl:    busPIRone  (BUSPAR ) 30 MG tablet, Take 30 mg by mouth 2 (two) times daily. PER PT STATES HER DOSE WAS INCREASED, Disp: , Rfl:    clonazePAM  (KLONOPIN ) 0.5 MG tablet, Take 0.5 mg by mouth 2 (two) times daily., Disp: , Rfl:    cyclobenzaprine  (FLEXERIL ) 10 MG tablet, Take 1 tablet (10 mg total) by mouth 3 (three) times daily as needed for muscle spasms., Disp: 90 tablet, Rfl: 2   JARDIANCE 25 MG TABS tablet, Take 25 mg by mouth daily., Disp: , Rfl:    metFORMIN  (GLUCOPHAGE -XR) 500 MG 24 hr tablet, Take 1,000 mg by mouth 2 (two) times daily., Disp: , Rfl:    oxyCODONE  (ROXICODONE ) 15 MG immediate release tablet, Take 1 tablet (15 mg total) by mouth every 6 (six) hours as needed for pain., Disp: 60 tablet, Rfl: 0   pantoprazole  (PROTONIX ) 40 MG tablet, Take 1 tablet (40 mg total) by mouth daily., Disp: 30 tablet, Rfl: 0   polyethylene glycol (MIRALAX  / GLYCOLAX ) 17 g packet, Take 17 g by mouth daily as needed for mild constipation or moderate constipation., Disp: 30 each, Rfl: 0   potassium chloride  SA (KLOR-CON  M) 20 MEQ tablet, Take 1 tablet (20 mEq total) by mouth 2 (two) times daily., Disp: 10 tablet, Rfl: 0   pregabalin  (LYRICA ) 150 MG capsule, Take 1 capsule (150 mg total) by mouth 3 (three) times daily., Disp: 90 capsule, Rfl: 2   senna-docusate (SENOKOT-S) 8.6-50 MG tablet, Take 1 tablet by mouth 2 (two) times daily. For constipation, Disp: 60 tablet, Rfl: 0   spironolactone  (ALDACTONE ) 25 MG tablet, TAKE 1 TABLET(25 MG) BY MOUTH EVERY MORNING, Disp: 90 tablet, Rfl: 3   zolpidem (AMBIEN) 10 MG tablet, Take 5-10 mg by mouth at bedtime., Disp: , Rfl:    Accu-Chek FastClix  Lancets MISC, Apply topically 2 (two) times daily., Disp: , Rfl:    ACCU-CHEK GUIDE TEST test strip, 2 (two) times daily., Disp: , Rfl:    diltiazem  (CARDIZEM  CD)  120 MG 24 hr capsule, TAKE ONE CAPSULE BY MOUTH EVERY DAY, Disp: 60 capsule, Rfl: 0   diltiazem  (CARDIZEM ) 30 MG tablet, TAKE 1 TABLET(30 MG) BY MOUTH THREE TIMES DAILY AS NEEDED, Disp: 270 tablet, Rfl: 0   DULoxetine  (CYMBALTA ) 60 MG capsule, Take 60 mg by mouth 2 (two) times daily. , Disp: , Rfl:    Fluticasone -Salmeterol (ADVAIR) 250-50 MCG/DOSE AEPB, Inhale 1 puff into the lungs 2 (two) times daily., Disp: , Rfl:    furosemide  (LASIX ) 40 MG tablet, TAKE 1 TABLET BY MOUTH EVERY DAY. MAY TAKE ADDITIONAL TABLET IN THE AM FOR WEIGHT GAIN AS NEEDED (Patient not taking: Reported on 05/31/2024), Disp: 110 tablet, Rfl: 2   levofloxacin  (LEVAQUIN ) 500 MG tablet, Take 500 mg by mouth daily. (Patient not taking: Reported on 05/31/2024), Disp: , Rfl:    magic mouthwash (nystatin , lidocaine , diphenhydrAMINE, alum & mag hydroxide) suspension, Take 5 mLs by mouth 4 (four) times daily as needed for mouth pain., Disp: 140 mL, Rfl: 1   ondansetron  (ZOFRAN ) 8 MG tablet, Take 1 tablet (8 mg total) by mouth daily as needed for nausea or vomiting. (Patient not taking: Reported on 05/31/2024), Disp: 30 tablet, Rfl: 0   ondansetron  (ZOFRAN -ODT) 4 MG disintegrating tablet, Take 1 tablet (4 mg total) by mouth every 6 (six) hours as needed for nausea or vomiting. (Patient not taking: Reported on 05/31/2024), Disp: 30 tablet, Rfl: 0   OXYGEN, Inhale 2 L into the lungs at bedtime. Or as needed throughout the day if short of breath, Disp: , Rfl:    prochlorperazine  (COMPAZINE ) 10 MG tablet, Take 1 tablet (10 mg total) by mouth every 6 (six) hours as needed for nausea or vomiting. (Patient not taking: Reported on 05/31/2024), Disp: 30 tablet, Rfl: 1  Allergies: Allergies  Allergen Reactions   Tramadol Palpitations and Other (See Comments)    Heart Skipping Beats    Penicillins Rash    Zelda Sharps, NP This note was created using Dragon dictation software. Please excuse any inadvertent transcription errors. Case was discussed with supervising physician Dr. Jadapalle who is agreeable with current plan.

## 2024-05-31 NOTE — ED Provider Notes (Addendum)
 [facility] Provider Note    Event Date/Time   First MD Initiated Contact with Patient 05/31/24 1008     (approximate)   History   Drug Overdose  EM caveat, somnolent, confusion  HPI  Erica [name] is a 58 y.o. female with medical history inclusive of stroke, diabetes COPD congestive heart failure chronic opioid use  Patient unable to give much history.  Opens eyes to voice and stimulation, but reports that she is not sure what happened.  She is at home with her mother something to that effect  Patient was found unresponsive.  There is with agonal respirations.  Was given naloxone  by EMS with report of rapid improvement  Of note the patient had a recent evaluation on October 11 with extensive workup at which time she presented which she was found to have difficulty being aroused and falling asleep frequently.  At her last visit a CT of the head chest x-ray, labs were reviewed.  During that time she also improved rapidly after naloxone .  I additionally reviewed the patient underwent MRI of the brain which was normal and November 11   Past Medical History:  Diagnosis Date   (HFpEF) heart failure with preserved ejection fraction (HCC)    a. 05/2015 Echo: EF 60-65%, no rwma, PASP .   Acute pancreatitis 08/13/2018   Anxiety    Arthritis    Asthma    Bell's palsy    no deficit   Bronchitis    Cancer (HCC) 12/2022   rectal cancer   CHF (congestive heart failure) (HCC)    COPD (chronic obstructive pulmonary disease) (HCC)    Depression    Diabetes mellitus without complication (HCC)    type II 03/2017   Dyspnea    Fatty liver    per patient   Gall stones    per patient   Gastric ulcer    Hyperlipidemia    Hypertension    Lung nodule 12/2022   upper left   OSA (obstructive sleep apnea)    a. did not tolerate CPAP. On oxygen 2L via [place]   Pancreatitis    07/2018   Shoulder injury    6/19   Smoker      Physical Exam   Triage Vital  Signs: ED Triage Vitals  Encounter Vitals Group     BP 05/31/24 1020 130/73     Girls Systolic BP Percentile --      Girls Diastolic BP Percentile --      Boys Systolic BP Percentile --      Boys Diastolic BP Percentile --      Pulse Rate 05/31/24 1020 (!) 119     Resp 05/31/24 1020 (!) 21     Temp 05/31/24 1020 98.3 F (36.8 C)     Temp src --      SpO2 05/31/24 1020 95 %     Weight 05/31/24 1023 143 lb 4.8 oz (65 kg)     Height 05/31/24 1023 5' 8 (1.727 m)     Head Circumference --      Peak Flow --      Pain Score 05/31/24 1023 5     Pain Loc --      Pain Education --      Exclude from Growth Chart --     Most recent vital signs: Vitals:   05/31/24 1430 05/31/24 1500  BP: (!) 96/46 (!) 94/57  Pulse: 66 66  Resp: 20 18  Temp:  SpO2: 92% 92%     General: Somnolent arouses to stimuli.  Mild bradypnea.  Normal oxygen saturation currently on 2 L CV:  Good peripheral perfusion.  Rhonchi right lung left lung clear Resp:  Normal effort.  Normal heart tones Abd:  No distention.  Soft nontender nondistended Other:  Normocephalic atraumatic no evidence of injury.  Has what appears to be dried stool around her feet or dirt   ED Results / Procedures / Treatments   Labs (all labs ordered are listed, but only abnormal results are displayed) Labs Reviewed  COMPREHENSIVE METABOLIC PANEL WITH GFR - Abnormal; Notable for the following components:      Result Value   Sodium 131 (*)    Chloride 97 (*)    CO2 17 (*)    Glucose, Bld 160 (*)    BUN 28 (*)    Creatinine, Ser 1.41 (*)    Calcium  8.6 (*)    Albumin 3.1 (*)    AST 61 (*)    Total Bilirubin 1.6 (*)    GFR, Estimated 43 (*)    Anion gap 17 (*)    All other components within normal limits  CBC - Abnormal; Notable for the following components:   RBC 3.61 (*)    Hemoglobin 10.7 (*)    HCT 32.4 (*)    RDW 18.1 (*)    All other components within normal limits  SALICYLATE LEVEL - Abnormal; Notable for the  following components:   Salicylate Lvl <7.0 (*)    All other components within normal limits  ACETAMINOPHEN  LEVEL - Abnormal; Notable for the following components:   Acetaminophen  (Tylenol ), Serum <10 (*)    All other components within normal limits  LACTIC ACID, PLASMA - Abnormal; Notable for the following components:   Lactic Acid, Venous 2.0 (*)    All other components within normal limits  CK - Abnormal; Notable for the following components:   Total CK 236 (*)    All other components within normal limits  CBG MONITORING, ED - Abnormal; Notable for the following components:   Glucose-Capillary 158 (*)    All other components within normal limits  TROPONIN T, HIGH SENSITIVITY - Abnormal; Notable for the following components:   Troponin T High Sensitivity 71 (*)    All other components within normal limits  CULTURE, BLOOD (ROUTINE X 2)  CULTURE, BLOOD (ROUTINE X 2)  ETHANOL  PROTIME-INR  URINE DRUG SCREEN  URINALYSIS, ROUTINE W REFLEX MICROSCOPIC  LACTIC ACID, PLASMA  CBG MONITORING, ED  TROPONIN T, HIGH SENSITIVITY     EKG  Inter by me at 10 AM heart rate 120 QRS 90 QTc 480 Some artifact, sinus rhythm no evidence of frank ischemia.  QT appears appropriate   RADIOLOGY  I originally had considered performing CT scan of the head but in light of her recent MRI and after receiving naloxone  she is now awake alert and oriented.  She reports that she was at home and cannot really tell why but she suddenly just went out.  She   CT head pending, grossly reviewed by me is negative for acute intracranial hemorrhage.  Final read pending by radiologist   PROCEDURES:  Critical Care performed: Yes, see critical care procedure note(s)  CRITICAL CARE Performed by: Oneil Budge   Total critical care time: 30 minutes  Critical care time was exclusive of separately billable procedures and treating other patients.  Critical care was necessary to treat or prevent imminent or  life-threatening deterioration.  Critical care was time spent personally by me on the following activities: development of treatment plan with patient and/or surrogate as well as nursing, discussions with consultants, evaluation of patient's response to treatment, examination of patient, obtaining history from patient or surrogate, ordering and performing treatments and interventions, ordering and review of laboratory studies, ordering and review of radiographic studies, pulse oximetry and re-evaluation of patient's condition.   Procedures   MEDICATIONS ORDERED IN ED: Medications  lactated ringers  bolus 500 mL (has no administration in time range)  naloxone  (NARCAN ) injection 0.4 mg (0.4 mg Intravenous Given 05/31/24 1043)  ipratropium-albuterol  (DUONEB) 0.5-2.5 (3) MG/3ML nebulizer solution 3 mL (3 mLs Nebulization Given 05/31/24 1219)  sodium chloride  0.9 % bolus 500 mL (0 mLs Intravenous Stopped 05/31/24 1251)  lactated ringers  bolus 1,000 mL (0 mLs Intravenous Stopped 05/31/24 1426)  meropenem  (MERREM ) 1 g in sodium chloride  0.9 % 100 mL IVPB (0 g Intravenous Stopped 05/31/24 1407)  lactated ringers  bolus 500 mL (0 mLs Intravenous Stopped 05/31/24 1525)     IMPRESSION / MDM / ASSESSMENT AND PLAN / ED COURSE  I reviewed the triage vital signs and the nursing notes.                              Differential diagnosis includes, but is not limited to, altered sensorium possibly in relation to polypharmacy though other consideration strongly given and also the fact that she has mild oxygen requirement and rhonchi in her right lobe I am highly concerned about a possible aspiration pneumonia or other type of insult.  She also appears to have evidence of AKI.  Her mental status initially suppressed, but after additional small dose of naloxone  she is awake and alert.  Continue to be monitored closely.  Breathing pattern is normalized and exception being rhonchi.  CO2 reduced at 17, GFR reduced  to 43.  Anion gap slightly elevated 17.  Alcohol level negative.  Pending salicylates and acetaminophen  levels   Patient is notably on multiple sedating medications including Ambien, oxycodone , and Klonopin   Patient's presentation is most consistent with acute presentation with potential threat to life or bodily function.   The patient is on the cardiac monitor to evaluate for evidence of arrhythmia and/or significant heart rate changes.   Clinical Course as of 05/31/24 1537  Thu May 31, 2024  1150 Patient alert, oriented.  On exam she has a rhonchi in the right lower lobe slight cough mild oxygen requirement.  No wheezing to noted.  Question if she may have aspiration or pneumonia?  Will obtain chest x-ray her alertness is definitely improving remains on monitoring [MQ]  1449 CT team advises they have been very busy, she is next for head CT.  Blood pressures improving.  Antibiotics initiated.  Patient remains fatigued but awakens easily to voice.  Findings concerning for aspiration pneumonia [MQ]  1534 Patient has been accepted for admission to hospital service by Dr. Suzann [MQ]  1534 Dr. Leesa admitting.  [MQ]    Clinical Course User Index [MQ] Dicky Anes, MD   Vitals:   05/31/24 1430 05/31/24 1500  BP: (!) 96/46 (!) 94/57  Pulse: 66 66  Resp: 20 18  Temp:    SpO2: 92% 92%   ----------------------------------------- 3:36 PM on 05/31/2024 ----------------------------------------- Sepsis reassessment  Patient resting, remains easily arousable to voice.  Even unlabored respirations oxygen saturation 92%.  In no acute distress or extremis but at this juncture I  think she clearly needs hospitalization.  She is showing improvement in her blood pressures with fluids.  Her lactate is not greater than 4.  Code sepsis has been initiated  Hospitalist admitting.  Vitals:   05/31/24 1430 05/31/24 1500  BP: (!) 96/46 (!) 94/57  Pulse: 66 66  Resp: 20 18  Temp:    SpO2: 92% 92%    Patient is even unlabored respirations responds to verbal and tactile stimuli.  Protecting airway.  Remains hypotensive.  At this point without ongoing bradypnea, I doubt that additional naloxone  would be effective unless we saw significant change or shallow or slower respiratory pattern etc.  Pupils are midpoint.  FINAL CLINICAL IMPRESSION(S) / ED DIAGNOSES   Final diagnoses:  Aspiration pneumonia of right lower lobe due to vomit (HCC)  Severe sepsis (HCC)  Somnolence  Polypharmacy     Rx / DC Orders   ED Discharge Orders     None        Note:  This document was prepared using Dragon voice recognition software and may include unintentional dictation errors.   Dicky Anes, MD 05/31/24 1538    Dicky Anes, MD 05/31/24 1538

## 2024-05-31 NOTE — Sepsis Progress Note (Signed)
 Sepsis protocol is being followed by [facility].

## 2024-05-31 NOTE — Consult Note (Addendum)
 ED Pharmacy Antibiotic Sign Off An antibiotic consult was received from an ED provider for sepsis 2/2 aspiration PNA with penicillin allergy per pharmacy dosing for Meropenem . A chart review was completed to assess appropriateness.   The following one time order(s) were placed:  Meropenem  1G x1  Further antibiotic and/or antibiotic pharmacy consults should be ordered by the admitting provider if indicated.   Thank you for allowing pharmacy to be a part of this patient's care.   Annabella LOISE Banks, [facility]  Clinical Pharmacist 05/31/24 1:26 PM

## 2024-05-31 NOTE — H&P (Signed)
 History and Physical    Patient: Erica [name] FMW:[id number] DOB: 1965-09-16 DOA: 05/31/2024 DOS: the patient was seen and examined on 05/31/2024 PCP: Tobie Domino, MD  Patient coming from: Home  Chief Complaint:  Chief Complaint  Patient presents with   Drug Overdose   HPI: Erica [name] is a 58 y.o. female with medical history significant of stage IV rectal cancer, CHF, COPD, diabetes, hyperlipidemia, hypertension, anxiety, arthritis, tobacco abuse, opioid use, polypharmacy. Patient presents to the ED after wellness check was called and EMS found patient unresponsive with agonal respirations.  She was given naloxone  in EMS with rapid improvement.  On arrival to the ED she was given a repeat dose. Vitals on arrival are tachycardic 113, tachypneic 22, initial BP 126/67. Labs reveal hyponatremia with sodium of 131, creatinine 1.41, anion gap acidosis with lactic acid 2.0, bicarb 17, tox screen negative.  Head CT without acute abnormality.  CXR with right airspace opacity suspicious for aspiration pneumonia. TRH was called for admission.  At the time my evaluation patient is awake and alert.  Speech is slurred but coherent.  She reports no acute pain at this time.  No chest pain.  She is bothered mostly by cough.  She reports prior to this event she has been well, no fever, no sick contacts.  The events leading up to EMS arrival are not entirely clear.  She reports that she was attending a birthday party at a [place] next[place]door hosted by one of her grandchildren.  She reports taking only prescribed medications today though has had some alcohol, she cannot quantify how much.    Review of Systems: As mentioned in the history of present illness. All other systems reviewed and are negative. Past Medical History:  Diagnosis Date   (HFpEF) heart failure with preserved ejection fraction (HCC)    a. 05/2015 Echo: EF 60-65%, no rwma, PASP .   Acute pancreatitis 08/13/2018   Anxiety     Arthritis    Asthma    Bell's palsy    no deficit   Bronchitis    Cancer (HCC) 12/2022   rectal cancer   CHF (congestive heart failure) (HCC)    COPD (chronic obstructive pulmonary disease) (HCC)    Depression    Diabetes mellitus without complication (HCC)    type II 03/2017   Dyspnea    Fatty liver    per patient   Gall stones    per patient   Gastric ulcer    Hyperlipidemia    Hypertension    Lung nodule 12/2022   upper left   OSA (obstructive sleep apnea)    a. did not tolerate CPAP. On oxygen 2L via [place]   Pancreatitis    07/2018   Shoulder injury    6/19   Smoker    Past Surgical History:  Procedure Laterality Date   BRONCHIAL NEEDLE ASPIRATION BIOPSY  01/27/2023   Procedure: BRONCHIAL NEEDLE ASPIRATION BIOPSIES;  Surgeon: Isadora Hose, MD;  Location: [facility] ENDOSCOPY;  Service: Pulmonary;;   COLONOSCOPY WITH PROPOFOL  N/A 12/15/2022   Procedure: COLONOSCOPY WITH PROPOFOL ;  Surgeon: Therisa Bi, MD;  Location[facility] ENDOSCOPY;  Service: Gastroenterology;  Laterality: N/A;   COLONOSCOPY WITH PROPOFOL  N/A 12/16/2022   Procedure: COLONOSCOPY WITH PROPOFOL ;  Surgeon: Therisa Bi, MD;  Location: [facility] ENDOSCOPY;  Service: Gastroenterology;  Laterality: N/A;   FLEXIBLE BRONCHOSCOPY N/A 06/20/2015   Procedure: FLEXIBLE BRONCHOSCOPY;  Surgeon: Laurance Flor, MD;  Location: [facility] ORS;  Service: Pulmonary;  Laterality: N/A;  IR EXCHANGE BILIARY DRAIN  01/19/2024   IR IMAGING GUIDED PORT INSERTION  02/25/2023   IR PERC CHOLECYSTOSTOMY  11/25/2023   IR REMOVAL BILIARY DRAIN  02/02/2024   KNEE SURGERY Left    8 knee surgeries   Social History:  reports that she has been smoking cigarettes. She has a 138 pack-year smoking history. She has never used smokeless tobacco. She reports current alcohol use. She reports that she does not use drugs.  Allergies  Allergen Reactions   Tramadol Palpitations and Other (See Comments)    Heart Skipping Beats   Penicillins Rash    Family History   Problem Relation Age of Onset   Breast cancer Mother        early 21's   Lung cancer Father    Brain cancer Father    Bone cancer Father    Colon cancer Maternal Aunt    Cancer Maternal Uncle        liver?   Diabetes Maternal Grandmother    Asthma Other     Prior to Admission medications   Medication Sig Start Date End Date Taking? Authorizing Provider  albuterol  (VENTOLIN  HFA) 108 (90 Base) MCG/ACT inhaler Inhale 2 puffs into the lungs every 6 (six) hours as needed for wheezing or shortness of breath.    Yes [provider]  busPIRone  (BUSPAR ) 30 MG tablet Take 30 mg by mouth 2 (two) times daily. PER PT STATES HER DOSE WAS INCREASED 02/14/20  Yes [provider]  clonazePAM  (KLONOPIN ) 0.5 MG tablet Take 0.5 mg by mouth 2 (two) times daily. 03/12/20  Yes [provider]  cyclobenzaprine  (FLEXERIL ) 10 MG tablet Take 1 tablet (10 mg total) by mouth 3 (three) times daily as needed for muscle spasms. 02/16/24  Yes Pickenpack-Cousar, Fannie SAILOR, NP  JARDIANCE 25 MG TABS tablet Take 25 mg by mouth daily. 08/18/21  Yes [provider]  metFORMIN  (GLUCOPHAGE -XR) 500 MG 24 hr tablet Take 1,000 mg by mouth 2 (two) times daily. 10/21/22  Yes [provider]  oxyCODONE  (ROXICODONE ) 15 MG immediate release tablet Take 1 tablet (15 mg total) by mouth every 6 (six) hours as needed for pain. 05/22/24  Yes Pickenpack-Cousar, Fannie SAILOR, NP  pantoprazole  (PROTONIX ) 40 MG tablet Take 1 tablet (40 mg total) by mouth daily. 05/04/18  Yes Delores Raford SAILOR, MD  polyethylene glycol (MIRALAX  / GLYCOLAX ) 17 g packet Take 17 g by mouth daily as needed for mild constipation or moderate constipation. 11/27/23  Yes Rai, Ripudeep K, MD  potassium chloride  SA (KLOR-CON  M) 20 MEQ tablet Take 1 tablet (20 mEq total) by mouth 2 (two) times daily. 05/16/24  Yes Ward, Josette SAILOR, DO  pregabalin  (LYRICA ) 150 MG capsule Take 1 capsule (150 mg total) by mouth 3 (three) times daily. 05/22/24  Yes  Pickenpack-Cousar, Fannie SAILOR, NP  senna-docusate (SENOKOT-S) 8.6-50 MG tablet Take 1 tablet by mouth 2 (two) times daily. For constipation 11/27/23  Yes Rai, Ripudeep K, MD  spironolactone  (ALDACTONE ) 25 MG tablet TAKE 1 TABLET(25 MG) BY MOUTH EVERY MORNING 05/23/23  Yes Gollan, Timothy J, MD  zolpidem (AMBIEN) 10 MG tablet Take 5-10 mg by mouth at bedtime. 12/01/22  Yes [provider]  Accu-Chek FastClix Lancets MISC Apply topically 2 (two) times daily. 01/17/24   [provider]  ACCU-CHEK GUIDE TEST test strip 2 (two) times daily. 01/16/24   [provider]  diltiazem  (CARDIZEM  CD) 120 MG 24 hr capsule TAKE ONE CAPSULE BY MOUTH EVERY DAY 05/21/24  Gollan, Timothy J, MD  diltiazem  (CARDIZEM ) 30 MG tablet TAKE 1 TABLET(30 MG) BY MOUTH THREE TIMES DAILY AS NEEDED 02/16/24   Gollan, Timothy J, MD  DULoxetine  (CYMBALTA ) 60 MG capsule Take 60 mg by mouth 2 (two) times daily.     [provider]  Fluticasone -Salmeterol (ADVAIR) 250-50 MCG/DOSE AEPB Inhale 1 puff into the lungs 2 (two) times daily.    [provider]  furosemide  (LASIX ) 40 MG tablet TAKE 1 TABLET BY MOUTH EVERY DAY. MAY TAKE ADDITIONAL TABLET IN THE AM FOR WEIGHT GAIN AS NEEDED Patient not taking: Reported on 05/31/2024 03/10/23   Gollan, Timothy J, MD  levofloxacin  (LEVAQUIN ) 500 MG tablet Take 500 mg by mouth daily. Patient not taking: Reported on 05/31/2024 12/08/23   [provider]  magic mouthwash (nystatin , lidocaine , diphenhydrAMINE, alum & mag hydroxide) suspension Take 5 mLs by mouth 4 (four) times daily as needed for mouth pain. 04/14/23   Lanny Callander, MD  ondansetron  (ZOFRAN ) 8 MG tablet Take 1 tablet (8 mg total) by mouth daily as needed for nausea or vomiting. Patient not taking: Reported on 05/31/2024 11/27/23   Rai, Nydia POUR, MD  ondansetron  (ZOFRAN -ODT) 4 MG disintegrating tablet Take 1 tablet (4 mg total) by mouth every 6 (six) hours as needed for nausea or vomiting. Patient  not taking: Reported on 05/31/2024 05/16/24   Ward, Josette SAILOR, DO  OXYGEN Inhale 2 L into the lungs at bedtime. Or as needed throughout the day if short of breath    [provider]  prochlorperazine  (COMPAZINE ) 10 MG tablet Take 1 tablet (10 mg total) by mouth every 6 (six) hours as needed for nausea or vomiting. Patient not taking: Reported on 05/31/2024 02/17/23   Lanny Callander, MD    Physical Exam: Vitals:   05/31/24 1425 05/31/24 1430 05/31/24 1500 05/31/24 1530  BP: (!) 99/56 (!) 96/46 (!) 94/57 108/69  Pulse: 68 66 66 86  Resp: 20 20 18  (!) 27  Temp:      SpO2: 92% 92% 92% 94%  Weight:      Height:       Constitutional:  Normal appearance. Non toxic-appearing.  HENT: Head Normocephalic and atraumatic.  Mucous membranes are moist.  Eyes:  Extraocular intact. Conjunctivae normal. Pupils are equal, round, and reactive to light.  Cardiovascular: Regular rate and rhythm Pulmonary: 2 L nasal cannula, diffuse rhonchi, end expiratory wheeze, some airway crackles, coughing, Neurological: No focal deficit present. alert. Oriented. Psychiatric: Mood and Affect congruent.   Data Reviewed:  CT Head Wo Contrast EXAM: CT HEAD WITHOUT CONTRAST 05/31/2024 03:18:19 PM  TECHNIQUE: CT of the head was performed without the administration of intravenous contrast. Automated exposure control, iterative reconstruction, and/or weight based adjustment of the mA/kV was utilized to reduce the radiation dose to as low as reasonably achievable.  COMPARISON: Head CT and MRI 05/26/2024.  CLINICAL HISTORY: Facial trauma, blunt.  FINDINGS:  BRAIN AND VENTRICLES: There is no evidence of an acute infarct, intracranial hemorrhage, mass, midline shift, hydrocephalus, or extra-axial fluid collection. Patchy cerebral white matter hypodensities are similar to the prior CT and nonspecific but compatible with mild chronic small vessel ischemic disease. Calcified atherosclerosis at the skull  base.  ORBITS: No acute abnormality.  SINUSES: Mild mucosal thickening in the ethmoid sinuses. Mastoid air cells clear.  SOFT TISSUES AND SKULL: No acute soft tissue abnormality. No skull fracture.  IMPRESSION: 1. No acute intracranial abnormality. 2. Mild chronic small vessel ischemic disease.  Electronically signed by: Dasie  Derrill MD 05/31/2024 03:35 PM EST RP Workstation: [id number] DG Chest Port 1 View EXAM: 1 VIEW(S) XRAY OF THE CHEST 05/31/2024 12:49:00 PM  COMPARISON: 05/26/2024  CLINICAL HISTORY: Unresponsive episode  FINDINGS:  LINES, TUBES AND DEVICES: Port-a-cath tip at low SVC.  LUNGS AND PLEURA: Suspect mild / patchy right mid and lower lung airspace disease. No pleural effusion. No pneumothorax.  HEART AND MEDIASTINUM: The left heart border is obscured.  BONES AND SOFT TISSUES: Patient rotated right. No acute osseous abnormality.  IMPRESSION: 1. Oblique portable radiograph. 2. patchy right infrahilar and lower lobe airspace opacities, suspicious for pneumonia 3. Obscuration of the left heart border, most likely due to obliquity; cannot exclude left perihilar airspace disease 4. Consider PA and lateral radiographs if feasible.  Electronically signed by: Rockey Kilts MD 05/31/2024 01:34 PM EST RP Workstation: [id number]   Lab Results  Component Value Date   WBC 6.7 05/31/2024   HGB 10.7 (L) 05/31/2024   HCT 32.4 (L) 05/31/2024   MCV 89.8 05/31/2024   PLT 158 05/31/2024      Latest Ref Rng & Units 05/31/2024   10:51 AM 05/26/2024    1:04 PM 05/16/2024   12:54 AM  BMP  Glucose 70 - 99 mg/dL 839  881  77   BUN 6 - 20 mg/dL 28  34  7   Creatinine 0.44 - 1.00 mg/dL 8.58  8.86  9.25   Sodium 135 - 145 mmol/L 131  132  139   Potassium 3.5 - 5.1 mmol/L 4.2  4.6  2.8   Chloride 98 - 111 mmol/L 97  100  101   CO2 22 - 32 mmol/L 17  20  23    Calcium  8.9 - 10.3 mg/dL 8.6  9.2  8.1      Assessment and Plan: Syncope Acute metabolic  encephalopathy - Workup ongoing.  Head CT on arrival without acute intracranial abnormality. - Tylenol , salicylate, EtOH all WNL. - Ammonia and TSH pending - CK mildly elevated to 36 - High-sensitivity troponin 71 with downtrend on repeat - Follow-up blood cultures - UA ordered and pending - Patient was responsive to Narcan  which was administered both with EMS and ED - Suspect polypharmacy is playing a significant role in her presentation.  She is on 15 mg oxycodone  3 times daily as well as Lyrica , clonazepam , and zolpidem.  She also drinks vodka daily.  She does endorse drinking today but cannot quantify the amount - PT/OT evals in a.m.  Polypharmacy - Patient has chronic pain secondary to rectal cancer.  She has had multiple prior admissions for altered mental status.  We will need to consider discontinuation or at least reducing her oxycodone .  She is also taking clonazepam , zolpidem Lyrica , and alcohol. - For now we will slowly decrease opioids and add alternative multimodal pain regimen  Chronic pain - Patient is currently receiving high-dose opioid therapy from oncology NP for neoplasm related pain.  On review of oncology notes malignancy was adequately treated without obvious residual on CT as recent as May 2025.  Pneumonia - CXR with right sided infiltrate.  Diffuse rhonchi and wheezing on exam - Suspect this is aspiration pneumonia.  Will cover with ceftriaxone, Azithromycin , Flagyl .  Allergy to Penicillin. - Flu/COVID/RSV ordered and pending  AKI - Baseline creatinine 0.74, 1.41 on arrival - On IV fluids, continue.  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 - Completed 8 cycles of chemotherapy in March 2025 with good response including significant reduction in lung mass and no obvious residual malignancy on abdominal CT. -  Has been seeing palliative for cancer related pain management - On chart review it does not appear that she has clear goals of care plan.  Treatment appears to have been impacted by housing instability and financial difficulties  Depression Anxiety - Resume home dose duloxetine  - Try to avoid sedating medications, will allow for very low-dose Ativan  to avoid benzo withdrawal  Alcohol abuse - Patient drinks vodka daily, she does endorse drinking today.  EtOH undetectable on arrival - Will initiate CIWA protocol, high risk of withdrawal - Thiamine and folate supplementation.  CHF - Most recent echocardiogram May 2025: LVEF 60 to 65%, diastolic parameters indeterminate - Follows outpatient with Dr. Perla - Resume home diltiazem .  Monitor blood pressure closely  Diabetes - Hemoglobin A1c: 5.6% in May - Sliding scale insulin  for now  Hyperlipidemia - Continue statin  Tobacco abuse - Will add nicotine  patch.  Has been counseled on cessation.    Advance Care Planning:   Code Status: Prior Full Code  Consults: n/a  Family Communication: none present at bedside  Severity of Illness: The appropriate patient status for this patient is INPATIENT. Inpatient status is judged to be reasonable and necessary in order to provide the required intensity of service to ensure the patient's safety. The patient's presenting symptoms, physical exam findings, and initial radiographic and laboratory data in the context of their chronic comorbidities is felt to place them at high risk for further clinical deterioration. Furthermore, it is not anticipated that the patient will be medically stable for discharge from the hospital within 2 midnights of admission.   * I certify that at the point of admission it is my clinical  judgment that the patient will require inpatient hospital care spanning beyond 2 midnights from the point of admission due to high intensity of service, high risk for further deterioration and high frequency of surveillance required.*  Author: Emely Fahy, DO 05/31/2024 4:11 PM  For on call review [email or web address].

## 2024-05-31 NOTE — BH Assessment (Signed)
 TTS consult isn't needed at this time. Per ER MD (Dr. Dicky) patient to be admitted to the medical floor.

## 2024-05-31 NOTE — Sepsis Progress Note (Signed)
Notified bedside nurse of need to draw repeat lactic acid per protocol since the 2nd LA was higher than the first..

## 2024-05-31 NOTE — Consult Note (Signed)
 Psychiatry consult received.  Per review of chart, patient is still undergoing extensive medical workup.  It appears multiple new diagnostic labs have been ordered for patient while in the emergency department and still awaiting collection.  At this time, patient still undergoing medical evaluation.  Psychiatry will attempt assessment once patient more medically stable.

## 2024-05-31 NOTE — ED Triage Notes (Signed)
 Pt to ER via EMS from home with reports of possible overdose.  EMS called out for unresponsive had to gain entry into residence.  On their arrival pt with agonal respirations.  Pt given 2mg  nasal narcan  with improvement.  Pt denies SI, states she was trying to get out of pain.  Pt with hx opf stage 4 lung CA and colon CA as well as COPD, DM, HTN.

## 2024-06-01 DIAGNOSIS — G9341 Metabolic encephalopathy: Secondary | ICD-10-CM | POA: Diagnosis not present

## 2024-06-01 LAB — URINALYSIS, COMPLETE (UACMP) WITH MICROSCOPIC
Bilirubin Urine: NEGATIVE
Glucose, UA: >=500 mg/dL — AB
Hgb urine dipstick: NEGATIVE
Ketones, ur: NEGATIVE mg/dL
Leukocytes,Ua: NEGATIVE
Nitrite: NEGATIVE
Protein, ur: 30 mg/dL — AB
Specific Gravity, Urine: 1.022 (ref 1.005–1.030)
pH: 5 (ref 5.0–8.0)

## 2024-06-01 LAB — GLUCOSE, CAPILLARY
Glucose-Capillary: 181 mg/dL — ABNORMAL HIGH (ref 70–99)
Glucose-Capillary: 132 mg/dL — ABNORMAL HIGH (ref 70–99)
Glucose-Capillary: 151 mg/dL — ABNORMAL HIGH (ref 70–99)
Glucose-Capillary: 144 mg/dL — ABNORMAL HIGH (ref 70–99)

## 2024-06-01 LAB — URINE DRUG SCREEN
Amphetamines: NEGATIVE
Barbiturates: NEGATIVE
Benzodiazepines: NEGATIVE
Cocaine: NEGATIVE
Fentanyl: NEGATIVE
Methadone Scn, Ur: NEGATIVE
Opiates: NEGATIVE
Tetrahydrocannabinol: NEGATIVE

## 2024-06-01 LAB — CBC
HCT: 28 % — ABNORMAL LOW (ref 36.0–46.0)
Hemoglobin: 9.7 g/dL — ABNORMAL LOW (ref 12.0–15.0)
MCH: 29.8 pg (ref 26.0–34.0)
MCHC: 34.6 g/dL (ref 30.0–36.0)
MCV: 85.9 fL (ref 80.0–100.0)
Platelets: 158 K/uL (ref 150–400)
RBC: 3.26 MIL/uL — ABNORMAL LOW (ref 3.87–5.11)
RDW: 18 % — ABNORMAL HIGH (ref 11.5–15.5)
WBC: 5.2 K/uL (ref 4.0–10.5)
nRBC: 0 % (ref 0.0–0.2)

## 2024-06-01 LAB — COMPREHENSIVE METABOLIC PANEL WITH GFR
ALT: 42 U/L (ref 0–44)
AST: 80 U/L — ABNORMAL HIGH (ref 15–41)
Albumin: 2.5 g/dL — ABNORMAL LOW (ref 3.5–5.0)
Alkaline Phosphatase: 58 U/L (ref 38–126)
Anion gap: 13 (ref 5–15)
BUN: 23 mg/dL — ABNORMAL HIGH (ref 6–20)
CO2: 21 mmol/L — ABNORMAL LOW (ref 22–32)
Calcium: 8.5 mg/dL — ABNORMAL LOW (ref 8.9–10.3)
Chloride: 101 mmol/L (ref 98–111)
Creatinine, Ser: 0.92 mg/dL (ref 0.44–1.00)
GFR, Estimated: >60 mL/min (ref >60)
Glucose, Bld: 157 mg/dL — ABNORMAL HIGH (ref 70–99)
Potassium: 3.3 mmol/L — ABNORMAL LOW (ref 3.5–5.1)
Sodium: 135 mmol/L (ref 135–145)
Total Bilirubin: 1 mg/dL (ref 0.0–1.2)
Total Protein: 5.6 g/dL — ABNORMAL LOW (ref 6.5–8.1)

## 2024-06-01 LAB — PHOSPHORUS: Phosphorus: 3.1 mg/dL (ref 2.5–4.6)

## 2024-06-01 LAB — MAGNESIUM: Magnesium: 1.5 mg/dL — ABNORMAL LOW (ref 1.7–2.4)

## 2024-06-01 LAB — T4, FREE: Free T4: 0.96 ng/dL (ref 0.61–1.12)

## 2024-06-01 MED ORDER — CHLORHEXIDINE GLUCONATE CLOTH 2 % EX PADS
6.0000 | MEDICATED_PAD | Freq: Every day | CUTANEOUS | Status: DC
  Administered 2024-06-01 – 2024-06-04 (×5): 6 via TOPICAL

## 2024-06-01 MED ORDER — VANCOMYCIN HCL 1250 MG/250ML IV SOLN
1250.0000 mg | INTRAVENOUS | Status: DC
  Administered 2024-06-02 – 2024-06-04 (×3): 1250 mg via INTRAVENOUS
  Filled 2024-06-01 (×3): qty 250

## 2024-06-01 MED ORDER — MIDODRINE HCL 5 MG PO TABS
15.0000 mg | ORAL_TABLET | Freq: Three times a day (TID) | ORAL | Status: DC
  Administered 2024-06-01 (×3): 15 mg via ORAL
  Filled 2024-06-01 (×4): qty 3

## 2024-06-01 MED ORDER — SODIUM CHLORIDE 0.9 % IV SOLN: INTRAVENOUS | Status: DC

## 2024-06-01 MED ORDER — SODIUM CHLORIDE 0.9 % IV BOLUS
1000.0000 mL | Freq: Once | INTRAVENOUS | Status: AC
  Administered 2024-06-01: 1000 mL via INTRAVENOUS

## 2024-06-01 MED ORDER — NICOTINE 14 MG/24HR TD PT24
14.0000 mg | MEDICATED_PATCH | Freq: Every day | TRANSDERMAL | Status: DC
  Administered 2024-06-01 – 2024-06-04 (×3): 14 mg via TRANSDERMAL
  Filled 2024-06-01 (×3): qty 1

## 2024-06-01 MED ORDER — VANCOMYCIN HCL 1500 MG/300ML IV SOLN
1500.0000 mg | Freq: Once | INTRAVENOUS | Status: AC
  Administered 2024-06-01: 1500 mg via INTRAVENOUS
  Filled 2024-06-01: qty 300

## 2024-06-01 MED ORDER — MIDODRINE HCL 5 MG PO TABS
10.0000 mg | ORAL_TABLET | Freq: Three times a day (TID) | ORAL | Status: DC
  Administered 2024-06-01: 10 mg via ORAL
  Filled 2024-06-01: qty 2

## 2024-06-01 MED ORDER — INFLUENZA VIRUS VACC SPLIT PF (FLUZONE) 0.5 ML IM SUSY: 0.5000 mL | PREFILLED_SYRINGE | INTRAMUSCULAR | Status: DC

## 2024-06-01 MED ORDER — SODIUM CHLORIDE 0.9% FLUSH: 10.0000 mL | INTRAVENOUS | Status: DC | PRN

## 2024-06-01 MED ORDER — SODIUM CHLORIDE 0.9% FLUSH
10.0000 mL | Freq: Two times a day (BID) | INTRAVENOUS | Status: DC
  Administered 2024-06-02 – 2024-06-03 (×3): 10 mL
  Administered 2024-06-03: 20 mL

## 2024-06-01 NOTE — Progress Notes (Signed)
 A person identifying herself as Sari was wanting to get information about the patient. Nurse informed Sari that she is in the patient's emergency contacts nor is there a password on the account therefore no information could be given at this time.  Sari was upset stating that she has no family and she is one that needs to get information.  Nurse informed 'Sari that when patient is Alert and Orientated a chaplin could be consulted to discuss POA and the process of how it can be done.  Patient is noted to alert X2 at this time.

## 2024-06-01 NOTE — Evaluation (Signed)
 Physical Therapy Evaluation Patient Details Name: Erica [name] MRN: [id number] DOB: 1966/01/13 Today's Date: 06/01/2024  History of Present Illness  Erica [name] is a 58 y.o. female with medical history significant of stage IV rectal cancer, CHF, COPD, diabetes, hyperlipidemia, hypertension, anxiety, arthritis, tobacco abuse, opioid use, polypharmacy. Patient presented to the ED on 06/01/24 after wellness check was called and EMS found patient unresponsive with agonal respirations. She was given naloxone  in EMS with rapid improvement. On arrival to the ED she was given a repeat dose.  Clinical Impression  Cotx with OT for safety. Pt is 58 y.o. female admitted for AMS. Pt is not A&O, and unable to answer orientation questions without cuing. PLOF unknown and family not at bedside to provide info. Attempt to call emergency contacts made without answer. Pt required max cuing throughout session to perform bed mobility and supine>stand. She is able to perform STS with CGA using RW. Pt demonstrates difficulty with supine<>sit requiring min-mod A for LE and trunk management. Pt actively trying to return to bed once up. D/t pt's poor cognition and need for max cuing she is unsafe to return home and may be at high risk for falls. Would benefit from skilled PT to address above deficits and promote optimal return to PLOF.       If plan is discharge home, recommend the following: A lot of help with walking and/or transfers;A lot of help with bathing/dressing/bathroom;Supervision due to cognitive status;Direct supervision/assist for financial management;Direct supervision/assist for medications management   Can travel by private vehicle   No    Equipment Recommendations Other (comment) (TBD at next venue)  Recommendations for Other Services       Functional Status Assessment Patient has had a recent decline in their functional status and demonstrates the ability to make significant improvements in  function in a reasonable and predictable amount of time.     Precautions / Restrictions Precautions Precautions: Fall Recall of Precautions/Restrictions: Impaired Restrictions Weight Bearing Restrictions Per Provider Order: No      Mobility  Bed Mobility Overal bed mobility: Needs Assistance Bed Mobility: Supine to Sit, Sit to Supine     Supine to sit: Mod assist Sit to supine: Min assist   General bed mobility comments: Cuing to initiate pt to sitting at EOB. Pt able to pull self up using bed rails into long sitting position. Mod A for trunk and LE management for supine>sit. min A for sit>supine. Pt actively returning to bed.    Transfers Overall transfer level: Needs assistance Equipment used: Rolling walker (2 wheels) Transfers: Sit to/from Stand Sit to Stand: Contact guard assist           General transfer comment: Able to stand from lowest bed height with max cuing.    Ambulation/Gait               General Gait Details: deferred. pt requesting to get back into bed  Stairs            Wheelchair Mobility     Tilt Bed    Modified Rankin (Stroke Patients Only)       Balance Overall balance assessment: Needs assistance Sitting-balance support: No upper extremity supported, Feet supported Sitting balance-Leahy Scale: Good Sitting balance - Comments: steady sitting at EOB   Standing balance support: Bilateral upper extremity supported, Reliant on assistive device for balance Standing balance-Leahy Scale: Fair Standing balance comment: Reliant on RW for balance. Steady with standing however antipate pt's balance would be  poor without AD.                             Pertinent Vitals/Pain Pain Assessment Pain Assessment: PAINAD Breathing: occasional labored breathing, short period of hyperventilation Negative Vocalization: none Facial Expression: smiling or inexpressive Body Language: tense, distressed pacing,  fidgeting Consolability: no need to console PAINAD Score: 2 Pain Intervention(s): Monitored during session    Home Living Family/patient expects to be discharged to:: Unsure Living Arrangements: (P) Children                 Additional Comments: Attempt to call daughters made with no answer. No family at bedside to confirm PLOF or home set up. Pt states that she lives with her grandmother    Prior Function Prior Level of Function : Patient poor historian/Family not available             Mobility Comments: Pt stating that she amb with quad cane that was in her room.       Extremity/Trunk Assessment   Upper Extremity Assessment Upper Extremity Assessment: Generalized weakness    Lower Extremity Assessment Lower Extremity Assessment: Generalized weakness    Cervical / Trunk Assessment Cervical / Trunk Assessment: Normal  Communication   Communication Communication: Impaired Factors Affecting Communication: Reduced clarity of speech    Cognition Arousal: Alert Behavior During Therapy: WFL for tasks assessed/performed   PT - Cognitive impairments: Orientation, Difficult to assess Difficult to assess due to: Impaired communication (difficult to understand at times) Orientation impairments: Person, Place, Time                   PT - Cognition Comments: Pt unable to state name, place, or time without cuing. Following commands: Impaired Following commands impaired: Follows one step commands inconsistently, Follows one step commands with increased time     Cueing Cueing Techniques: Verbal cues, Tactile cues, Visual cues     General Comments General comments (skin integrity, edema, etc.): Pt on 2L of O2 throughout session. WNL.    Exercises     Assessment/Plan    PT Assessment Patient needs continued PT services  PT Problem List Decreased strength;Decreased activity tolerance;Decreased balance;Decreased mobility;Decreased knowledge of use of  DME;Decreased cognition;Decreased safety awareness       PT Treatment Interventions DME instruction;Gait training;Functional mobility training;Therapeutic activities;Therapeutic exercise;Neuromuscular re-education;Balance training;Patient/family education    PT Goals (Current goals can be found in the Care Plan section)  Acute Rehab PT Goals Patient Stated Goal: none stated PT Goal Formulation: Patient unable to participate in goal setting Time For Goal Achievement: 06/15/24 Potential to Achieve Goals: Good    Frequency Min 2X/week     Co-evaluation PT/OT/SLP Co-Evaluation/Treatment: Yes Reason for Co-Treatment: For patient/therapist safety PT goals addressed during session: Mobility/safety with mobility OT goals addressed during session: ADL's and self-care       AM-PAC PT 6 Clicks Mobility  Outcome Measure Help needed turning from your back to your side while in a flat bed without using bedrails?: A Little Help needed moving from lying on your back to sitting on the side of a flat bed without using bedrails?: A Lot Help needed moving to and from a bed to a chair (including a wheelchair)?: A Lot Help needed standing up from a chair using your arms (e.g., wheelchair or bedside chair)?: A Little Help needed to walk in hospital room?: A Lot Help needed climbing 3-5 steps with a railing? : A  Lot 6 Click Score: 14    End of Session Equipment Utilized During Treatment: Oxygen Activity Tolerance: Patient tolerated treatment well Patient left: in bed;with call bell/phone within reach;with bed alarm set Nurse Communication: Mobility status PT Visit Diagnosis: Difficulty in walking, not elsewhere classified (R26.2);Muscle weakness (generalized) (M62.81)    Time: 9076-9056 PT Time Calculation (min) (ACUTE ONLY): 20 min   Charges:                 Adelita Hone, SPT   Ananiah Maciolek 06/01/2024, 12:05 PM

## 2024-06-01 NOTE — Progress Notes (Signed)
 PROGRESS NOTE    PENNIE VANBLARCOM  FMW:[id number] DOB: 1966/06/30 DOA: 05/31/2024 PCP: Tobie Domino, MD  Chief Complaint  Patient presents with   Drug Overdose    Hospital Course:  Erica [name] is a 58 y.o. female with medical history significant of stage IV rectal cancer, CHF, COPD, diabetes, hyperlipidemia, hypertension, anxiety, arthritis, tobacco abuse, opioid use, polypharmacy. Patient presents to the ED after wellness check was called and EMS found patient unresponsive with agonal respirations.  She was given naloxone  in EMS with rapid improvement.  On arrival to the ED she was given a repeat dose with improvement.  Patient reports that she has been taking her 50 mg oxycodone  3 times daily, has also had some alcohol.  She cannot quantify how much. Vitals on arrival are tachycardic 113, tachypneic 22, initial BP 126/67. Labs reveal hyponatremia with sodium of 131, creatinine 1.41, anion gap acidosis with lactic acid 2.0, bicarb 17, tox screen negative.  Head CT without acute abnormality.  CXR with right airspace opacity suspicious for aspiration pneumonia. TRH was called for admission.  She was started on ceftriaxone, azithromycin , Flagyl . Lactic acid continued to rise overnight and blood pressure was persistently low.  MRSA swab resulted positive.  Vancomycin  added 1/14.  Subjective: Hypotensive overnight requiring 1 L fluid bolus.  This morning 87/46 with a MAP of 54.  Midodrine increased, IV fluids restarted.  Vancomycin  added.  Evaluation this morning patient reports no acute issues.  No pain currently.  Objective: Vitals:   06/01/24 0355 06/01/24 0552 06/01/24 0716 06/01/24 0739  BP: (!) 85/46 92/60  (!) 87/46  Pulse: 87 83  83  Resp: 16 18  16   Temp: 98.1 F (36.7 C)   97.8 F (36.6 C)  TempSrc:      SpO2: 92% 94% 94% 91%  Weight:      Height:        Intake/Output Summary (Last 24 hours) at 06/01/2024 0752 Last data filed at 06/01/2024 0535 Gross per 24 hour   Intake 1810 ml  Output 700 ml  Net 1110 ml   Filed Weights   05/31/24 1023  Weight: 65 kg    Examination: General exam: Appears calm and comfortable, NAD, frail-appearing Respiratory system: No work of breathing, 2 L nasal cannula in place, still with some rhonchi, wheezing improved Cardiovascular system: S1 & S2 heard, RRR.  Gastrointestinal system: Abdomen is nondistended, soft and nontender.  Neuro: Drowsy but arousable, with prompting is able to accurately report the year, president, and situation  Assessment & Plan:  Principal Problem:   Altered mental status    Acute metabolic encephalopathy Unintentional drug overdose suspected - Likely secondary to polypharmacy, high opioid burden and alcohol mix - Head CT on arrival without acute intracranial abnormality. - Tylenol , salicylate, EtOH all WNL. - Ammonia WNL - TSH very low, T4 WNL.  Will hold off on treatment in this acute setting for now.  Will need close outpatient follow-up with PCP to discuss repeat TSH - Ammonia and TSH pending - CK mildly elevated 236 - High-sensitivity troponin 71 with downtrend on repeat - Follow-up blood cultures, currently negative - UA and UDS ordered but still pending.  Discussed with RN - Patient was responsive to Narcan  which was administered both with EMS and ED - PT/OT evals   Hypotension - Patient is persistently hypotensive with lactic acidosis suspect related to infection. Does not have fever, leukocytosis, or tachycardia.  Does not meet sepsis criteria at this time - s/p 1  L fluid bolus, continue on 125 cc/h NS.  Monitor closely for signs of volume overload.  Has grade 1 diastolic dysfunction - Midodrine 15 mg 3 times daily  Polypharmacy - Patient has chronic pain secondary to rectal cancer.  She has had multiple prior admissions for altered mental status.  We will need to consider discontinuation or at least significantly reducing her oxycodone .  She is also taking clonazepam ,  zolpidem Lyrica , and alcohol. - For now, hold all opioids   Chronic pain - Patient is currently receiving high-dose opioid therapy from oncology NP for neoplasm related pain.  On review of oncology notes malignancy was adequately treated without obvious residual on CT as recent as May 2025.   Pneumonia - CXR with right sided infiltrate.  Diffuse rhonchi and wheezing on exam - Suspect this is aspiration pneumonia given encephalopathy on arrival with agonal respirations but cannot rule out community-acquired pneumonia prior to this episode.  Initially covered with ceftriaxone azithromycin  and Flagyl .  Lactic acid is rising and patient is persistently hypotensive.  MRSA PCR positive.  Have added vancomycin  per pharmacy dosing - Flu/COVID/RSV negative    AKI - Baseline creatinine 0.74, 1.41 on arrival - Has resolved to baseline now.  Continue IV fluids   Lactic acidosis - Continued to rise, improving them now - Follow blood cultures - Antibiotic therapy as above.   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 - Completed 8 cycles of chemotherapy in March 2025 with good response including significant reduction in lung mass and no obvious residual malignancy on abdominal CT. -  Has been seeing palliative for cancer related pain management - On chart review it does not appear that she has clear goals of care plan.  Treatment appears to have been impacted by housing instability and financial difficulties   Depression Anxiety - Resume home dose duloxetine  - Try to avoid sedating medications, will allow for very low-dose Ativan  to avoid benzo withdrawal   Alcohol abuse - Patient drinks vodka daily, she does endorse drinking today.  EtOH undetectable on arrival -  Will initiate CIWA protocol, high risk of withdrawal - Thiamine and folate supplementation.  HF Preserved EF, grade 1 diastolic dysfunction - Most recent echocardiogram May 2025: LVEF 60 to 65%, diastolic parameters indeterminate - Follows outpatient with Dr. Gollan -On diltiazem  at home for palpitations.  Holding for now given blood pressure   Diabetes - Hemoglobin A1c: 5.6% in May - Sliding scale insulin  for now   Hyperlipidemia - Continue statin   Tobacco abuse - Will add nicotine  patch.  Has been counseled on cessation.  DVT prophylaxis: Heparin    Code Status: Full Code Disposition: Inpatient pending clinical resolution  Consultants:    Procedures:    Antimicrobials:  Anti-infectives (From admission, onward)    Start     Dose/Rate Route Frequency Ordered Stop   05/31/24 2000  metroNIDAZOLE  (FLAGYL ) IVPB 500 mg        500 mg 100 mL/hr over 60 Minutes Intravenous Every 12 hours 05/31/24 1736     05/31/24 1815  azithromycin  (ZITHROMAX ) 500 mg in sodium chloride  0.9 % 250 mL IVPB        500 mg 250 mL/hr over 60 Minutes Intravenous Every 24 hours 05/31/24 1722 06/03/24 1814  05/31/24 1800  cefTRIAXone (ROCEPHIN) 2 g in sodium chloride  0.9 % 100 mL IVPB        2 g 200 mL/hr over 30 Minutes Intravenous Every 24 hours 05/31/24 1736     05/31/24 1330  meropenem  (MERREM ) 1 g in sodium chloride  0.9 % 100 mL IVPB        1 g 200 mL/hr over 30 Minutes Intravenous  Once 05/31/24 1325 05/31/24 1407       Data Reviewed: I have personally reviewed following labs and imaging studies CBC: Recent Labs  Lab 05/26/24 1304 05/31/24 1051 06/01/24 0413  WBC 8.6 6.7 5.2  HGB 12.7 10.7* 9.7*  HCT 38.5 32.4* 28.0*  MCV 88.9 89.8 85.9  PLT 196 158 158   Basic Metabolic Panel: Recent Labs  Lab 05/26/24 1304 05/31/24 1051 06/01/24 0413  NA 132* 131* 135  K 4.6 4.2 3.3*  CL 100 97* 101  CO2 20* 17* 21*  GLUCOSE 118* 160* 157*  BUN 34* 28* 23*  CREATININE 1.13* 1.41* 0.92   CALCIUM  9.2 8.6* 8.5*  MG  --   --  1.5*  PHOS  --   --  3.1   GFR: Estimated Creatinine Clearance: 66.4 mL/min (by C-G formula based on SCr of 0.92 mg/dL). Liver Function Tests: Recent Labs  Lab 05/26/24 1304 05/31/24 1051 06/01/24 0413  AST 19 61* 80*  ALT 14 33 42  ALKPHOS 92 91 58  BILITOT 1.5* 1.6* 1.0  PROT 7.3 6.5 5.6*  ALBUMIN 3.8 3.1* 2.5*   CBG: Recent Labs  Lab 05/26/24 1303 05/31/24 1016 05/31/24 2125 06/01/24 0750  GLUCAP 128* 158* 135* 181*    Recent Results (from the past 240 hours)  Blood Culture (routine x 2)     Status: None (Preliminary result)   Collection Time: 05/31/24  1:27 PM   Specimen: BLOOD  Result Value Ref Range Status   Specimen Description BLOOD BLOOD RIGHT HAND  Final   Special Requests   Final    BOTTLES DRAWN AEROBIC AND ANAEROBIC Blood Culture results may not be optimal due to an inadequate volume of blood received in culture bottles   Culture   Final    NO GROWTH < 24 HOURS Performed at [facility], [address], [place], [place] [address]    Report Status PENDING  Incomplete  Blood Culture (routine x 2)     Status: None (Preliminary result)   Collection Time: 05/31/24  1:27 PM   Specimen: BLOOD  Result Value Ref Range Status   Specimen Description BLOOD BLOOD RIGHT ARM  Final   Special Requests   Final    BOTTLES DRAWN AEROBIC AND ANAEROBIC Blood Culture results may not be optimal due to an inadequate volume of blood received in culture bottles   Culture   Final    NO GROWTH < 24 HOURS Performed at [facility], [address], [place], [place] [address]    Report Status PENDING  Incomplete  Resp panel by RT-PCR (RSV, Flu A&B, Covid) Anterior Nasal Swab     Status: None   Collection Time: 05/31/24  5:19 PM   Specimen: Anterior Nasal Swab  Result Value Ref Range Status   SARS Coronavirus 2 by RT PCR NEGATIVE NEGATIVE Final    Comment: (NOTE) SARS-CoV-2 target nucleic acids are NOT DETECTED.  The  SARS-CoV-2 RNA is generally detectable in upper respiratory specimens during the acute phase of infection. The lowest concentration of SARS-CoV-2 viral copies this assay can  detect is 138 copies/mL. A negative result does not preclude SARS-Cov-2 infection and should not be used as the sole basis for treatment or other patient management decisions. A negative result may occur with  improper specimen collection/handling, submission of specimen other than nasopharyngeal swab, presence of viral mutation(s) within the areas targeted by this assay, and inadequate number of viral copies(<138 copies/mL). A negative result must be combined with clinical observations, patient history, and epidemiological information. The expected result is Negative.  Fact Sheet for Patients:  bloggercourse.com  Fact Sheet for Healthcare Providers:  seriousbroker.it  This test is no t yet approved or cleared by the [place]  FDA and  has been authorized for detection and/or diagnosis of SARS-CoV-2 by FDA under an Emergency Use Authorization (EUA). This EUA will remain  in effect (meaning this test can be used) for the duration of the COVID-19 declaration under Section 564(b)(1) of the Act, 21 U.S.C.section 360bbb-3(b)(1), unless the authorization is terminated  or revoked sooner.       Influenza A by PCR NEGATIVE NEGATIVE Final   Influenza B by PCR NEGATIVE NEGATIVE Final    Comment: (NOTE) The Xpert Xpress SARS-CoV-2/FLU/RSV plus assay is intended as an aid in the diagnosis of influenza from Nasopharyngeal swab specimens and should not be used as a sole basis for treatment. Nasal washings and aspirates are unacceptable for Xpert Xpress SARS-CoV-2/FLU/RSV testing.  Fact Sheet for Patients: bloggercourse.com  Fact Sheet for Healthcare Providers: seriousbroker.it  This test is not yet approved or  cleared by the [place]  FDA and has been authorized for detection and/or diagnosis of SARS-CoV-2 by FDA under an Emergency Use Authorization (EUA). This EUA will remain in effect (meaning this test can be used) for the duration of the COVID-19 declaration under Section 564(b)(1) of the Act, 21 U.S.C. section 360bbb-3(b)(1), unless the authorization is terminated or revoked.     Resp Syncytial Virus by PCR NEGATIVE NEGATIVE Final    Comment: (NOTE) Fact Sheet for Patients: bloggercourse.com  Fact Sheet for Healthcare Providers: seriousbroker.it  This test is not yet approved or cleared by the [place]  FDA and has been authorized for detection and/or diagnosis of SARS-CoV-2 by FDA under an Emergency Use Authorization (EUA). This EUA will remain in effect (meaning this test can be used) for the duration of the COVID-19 declaration under Section 564(b)(1) of the Act, 21 U.S.C. section 360bbb-3(b)(1), unless the authorization is terminated or revoked.  Performed at [facility], [address] [place], [place] [address]   MRSA Next Gen by PCR, Nasal     Status: Abnormal   Collection Time: 05/31/24  5:19 PM   Specimen: Anterior Nasal Swab  Result Value Ref Range Status   MRSA by PCR Next Gen DETECTED (A) NOT DETECTED Final    Comment: RESULT CALLED TO, READ BACK BY AND VERIFIED WITH: VANESSA GRAHAM @2057  ON 05/31/24 SKL (NOTE) The GeneXpert MRSA Assay (FDA approved for NASAL specimens only), is one component of a comprehensive MRSA colonization surveillance program. It is not intended to diagnose MRSA infection nor to guide or monitor treatment for MRSA infections. Test performance is not FDA approved in patients less than 62 years old. Performed at [facility], [address] [place], [place] [address]      Radiology Studies: CT Head Wo Contrast Result Date: 05/31/2024 EXAM: CT HEAD WITHOUT  CONTRAST 05/31/2024 03:18:19 PM TECHNIQUE: CT of the head was performed without the administration of intravenous contrast. Automated exposure control, iterative reconstruction, and/or  weight based adjustment of the mA/kV was utilized to reduce the radiation dose to as low as reasonably achievable. COMPARISON: Head CT and MRI 05/26/2024. CLINICAL HISTORY: Facial trauma, blunt. FINDINGS: BRAIN AND VENTRICLES: There is no evidence of an acute infarct, intracranial hemorrhage, mass, midline shift, hydrocephalus, or extra-axial fluid collection. Patchy cerebral white matter hypodensities are similar to the prior CT and nonspecific but compatible with mild chronic small vessel ischemic disease. Calcified atherosclerosis at the skull base. ORBITS: No acute abnormality. SINUSES: Mild mucosal thickening in the ethmoid sinuses. Mastoid air cells clear. SOFT TISSUES AND SKULL: No acute soft tissue abnormality. No skull fracture. IMPRESSION: 1. No acute intracranial abnormality. 2. Mild chronic small vessel ischemic disease. Electronically signed by: Dasie Hamburg MD 05/31/2024 03:35 PM EST RP Workstation: [id number]   DG Chest Port 1 View Result Date: 05/31/2024 EXAM: 1 VIEW(S) XRAY OF THE CHEST 05/31/2024 12:49:00 PM COMPARISON: 05/26/2024 CLINICAL HISTORY: Unresponsive episode FINDINGS: LINES, TUBES AND DEVICES: Port-a-cath tip at low SVC. LUNGS AND PLEURA: Suspect mild / patchy right mid and lower lung airspace disease. No pleural effusion. No pneumothorax. HEART AND MEDIASTINUM: The left heart border is obscured. BONES AND SOFT TISSUES: Patient rotated right. No acute osseous abnormality. IMPRESSION: 1. Oblique portable radiograph. 2. patchy right infrahilar and lower lobe airspace opacities, suspicious for pneumonia 3. Obscuration of the left heart border, most likely due to obliquity; cannot exclude left perihilar airspace disease 4. Consider PA and lateral radiographs if feasible. Electronically signed by: Rockey Kilts MD 05/31/2024 01:34 PM EST RP Workstation: [id number]    Scheduled Meds:  busPIRone   30 mg Oral BID   diltiazem   120 mg Oral Daily   DULoxetine   60 mg Oral BID   feeding supplement  237 mL Oral BID BM   fluticasone  furoate-vilanterol  1 puff Inhalation Daily   folic acid  1 mg Oral Daily   heparin   5,000 Units Subcutaneous Q8H   [START ON 06/02/2024] influenza vac split trivalent PF  0.5 mL Intramuscular Tomorrow-1000   insulin  aspart  0-6 Units Subcutaneous TID WC   ipratropium-albuterol   3 mL Nebulization TID   midodrine  15 mg Oral TID WC   multivitamin with minerals  1 tablet Oral Daily   pantoprazole   40 mg Oral Daily   predniSONE   50 mg Oral Q breakfast   thiamine  100 mg Oral Daily   Or   thiamine  100 mg Intravenous Daily   Continuous Infusions:  sodium chloride      azithromycin  500 mg (05/31/24 1903)   cefTRIAXone (ROCEPHIN)  IV 2 g (05/31/24 1904)   metronidazole  500 mg (05/31/24 2151)     LOS: 1 day  MDM: Patient is high risk for one or more organ failure.  They necessitate ongoing hospitalization for continued IV therapies and subsequent lab monitoring. Total time spent interpreting labs and vitals, reviewing the medical record, coordinating care amongst consultants and care team members, directly assessing and discussing care with the patient and/or family: 55 min  Salinda Snedeker, DO [facility]  To contact the attending physician between 7A-7P please use Epic Chat. To contact the covering physician during after hours 7P-7A, please review Amion.  06/01/2024, 7:52 AM   *This document has been created with the assistance of dictation software. Please excuse typographical errors. *

## 2024-06-01 NOTE — Progress Notes (Signed)
 Pharmacy Antibiotic Note  Erica [name] is a 58 y.o. female admitted on 05/31/2024 with pneumonia. Pharmacy has been consulted for vancomycin  dosing.  Plan: Give vancomycin  load of 1500 mg IV x1 Start vancomycin  1250 mg IV every 24 hours (eAUC 448.7, Scr 0.92, IBW used, Vd 0.72 L/kg)  Continue to monitor renal function, clinical status, culture data, and antibiotic LOT  Height: 5' 8 (172.7 cm) Weight: 65 kg (143 lb 4.8 oz) IBW/kg (Calculated) : 63.9  Temp (24hrs), Avg:98.2 F (36.8 C), Min:97.8 F (36.6 C), Max:98.5 F (36.9 C)  Recent Labs  Lab 05/26/24 1304 05/31/24 1051 05/31/24 1327 05/31/24 1658 05/31/24 2131 06/01/24 0413  WBC 8.6 6.7  --   --   --  5.2  CREATININE 1.13* 1.41*  --   --   --  0.92  LATICACIDVEN 1.2  --  2.0* 3.2* 2.3*  --     Estimated Creatinine Clearance: 66.4 mL/min (by C-G formula based on SCr of 0.92 mg/dL).    Allergies  Allergen Reactions   Tramadol Palpitations and Other (See Comments)    Heart Skipping Beats   Penicillins Rash   Antimicrobials this admission: ceftriaxone 11/13 >>  Azithromycin  11/13 >>  Vancomcyin 11/14 >>  Dose adjustments this admission: N/A  Microbiology results: 11/13 BCx: No growth < 12 hours 11/13 MRSA PCR: positive  Thank you for involving pharmacy in this patient's care.   Damien Napoleon, PharmD Clinical Pharmacist 06/01/2024 9:55 AM

## 2024-06-01 NOTE — Plan of Care (Signed)
   Problem: Education: Goal: Knowledge of General Education information will improve Description: Including pain rating scale, medication(s)/side effects and non-pharmacologic comfort measures Outcome: Progressing   Problem: Clinical Measurements: Goal: Respiratory complications will improve Outcome: Progressing   Problem: Activity: Goal: Risk for activity intolerance will decrease Outcome: Progressing   Problem: Coping: Goal: Level of anxiety will decrease Outcome: Progressing   Problem: Safety: Goal: Ability to remain free from injury will improve Outcome: Progressing

## 2024-06-01 NOTE — Evaluation (Signed)
 Occupational Therapy Evaluation Patient Details Name: Erica [name] MRN: [id number] DOB: [birth year] Today's Date: 06/01/2024   History of Present Illness   [name] is a 58 y.o. female with medical history significant of stage IV rectal cancer, CHF, COPD, diabetes, hyperlipidemia, hypertension, anxiety, arthritis, tobacco abuse, opioid use, polypharmacy. Patient presented to the ED on 06/01/24 after wellness check was called and EMS found patient unresponsive with agonal respirations. She was given naloxone  in EMS with rapid improvement. On arrival to the ED she was given a repeat dose.     Clinical Impressions [name] was seen for OT/PT co-evaluation this date. Pt presents with generalized confusion t/o session. She is A&Ox0 and states she lives with her grandmother. No family/caregivers available at bedside or over the phone to provide information regarding PLOF. Pt presents with deficits in cognition, safety awareness, activity tolerance, and balance, affecting safe and optimal ADL completion. Pt currently requires MOD A for bed mobility and significant multimodal cuing for task initiation.  Pt would benefit from skilled OT services to address noted impairments and functional limitations (see below for any additional details) in order to maximize safety and independence while minimizing future risk of falls, injury, and readmission. Anticipate the need for follow up OT services upon acute hospital DC.      If plan is discharge home, recommend the following:   A little help with walking and/or transfers;A lot of help with bathing/dressing/bathroom;Assistance with cooking/housework;Assist for transportation;Help with stairs or ramp for entrance;Supervision due to cognitive status     Functional Status Assessment   Patient has had a recent decline in their functional status and demonstrates the ability to make significant improvements in function in a reasonable and predictable  amount of time.     Equipment Recommendations   BSC/3in1     Recommendations for Other Services         Precautions/Restrictions   Precautions Precautions: Fall Recall of Precautions/Restrictions: Impaired Restrictions Weight Bearing Restrictions Per Provider Order: No     Mobility Bed Mobility Overal bed mobility: Needs Assistance Bed Mobility: Supine to Sit, Sit to Supine     Supine to sit: Mod assist Sit to supine: Min assist   General bed mobility comments: Cuing to initiate pt to sitting at EOB. Pt able to pull self up using bed rails into long sitting position. Mod A for trunk and LE management for supine>sit. min A for sit>supine. Pt actively returning to bed.    Transfers Overall transfer level: Needs assistance Equipment used: Rolling walker (2 wheels) Transfers: Sit to/from Stand Sit to Stand: Contact guard assist           General transfer comment: Able to stand from lowest bed height with max cuing.      Balance Overall balance assessment: Needs assistance Sitting-balance support: No upper extremity supported, Feet supported Sitting balance-Leahy Scale: Good Sitting balance - Comments: steady sitting at EOB   Standing balance support: Bilateral upper extremity supported, Reliant on assistive device for balance Standing balance-Leahy Scale: Fair Standing balance comment: Reliant on RW for balance. Steady with standing however antipate pt's balance would be poor without AD.                           ADL either performed or assessed with clinical judgement   ADL Overall ADL's : Needs assistance/impaired  General ADL Comments: Significantly limited by cognition. Requires MOD A to come to sitting at EOB. Unable to initiate functional transfers. Perseverates on wanting her blankets around her. Is able to stand with close supervision and no physical assistance using RW for support.  Requires multimodal cueing to safely return to bed.     Vision Patient Visual Report: No change from baseline       Perception         Praxis         Pertinent Vitals/Pain Pain Assessment Pain Assessment: PAINAD Breathing: occasional labored breathing, short period of hyperventilation Negative Vocalization: none Facial Expression: smiling or inexpressive Body Language: tense, distressed pacing, fidgeting Consolability: no need to console PAINAD Score: 2     Extremity/Trunk Assessment Upper Extremity Assessment Upper Extremity Assessment: Generalized weakness   Lower Extremity Assessment Lower Extremity Assessment: Generalized weakness   Cervical / Trunk Assessment Cervical / Trunk Assessment: Normal   Communication Communication Communication: Impaired Factors Affecting Communication: Reduced clarity of speech   Cognition Arousal: Alert Behavior During Therapy: WFL for tasks assessed/performed Cognition: Cognition impaired   Orientation impairments: Person, Place, Time, Situation Awareness: Intellectual awareness impaired, Online awareness impaired Memory impairment (select all impairments): Short-term memory Attention impairment (select first level of impairment): Sustained attention Executive functioning impairment (select all impairments): Initiation, Organization, Sequencing, Reasoning, Problem solving                   Following commands: Impaired Following commands impaired: Follows one step commands inconsistently, Follows one step commands with increased time     Cueing  General Comments   Cueing Techniques: Verbal cues;Tactile cues;Visual cues  Pt on 2L of O2 throughout session. WNL.   Exercises Other Exercises Other Exercises: Pt education limited by cognition.   Shoulder Instructions      Home Living Family/patient expects to be discharged to:: Unsure Living Arrangements: Children (Per chart)                                Additional Comments: No family/caregivers available to provide information regarding PLOF. Pt is unreliable historian. A&O x0 during session. States she lives with her grandmother.      Prior Functioning/Environment Prior Level of Function : Patient poor historian/Family not available             Mobility Comments: Pt stating that she amb with quad cane that was in her room. ADLs Comments: reports she is independent.    OT Problem List: Decreased strength;Decreased cognition;Decreased safety awareness;Impaired balance (sitting and/or standing);Decreased knowledge of use of DME or AE;Decreased activity tolerance;Cardiopulmonary status limiting activity   OT Treatment/Interventions: Self-care/ADL training;Therapeutic exercise;Therapeutic activities;DME and/or AE instruction;Patient/family education;Balance training;Cognitive remediation/compensation      OT Goals(Current goals can be found in the care plan section)   Acute Rehab OT Goals OT Goal Formulation: Patient unable to participate in goal setting Time For Goal Achievement: 06/15/24 ADL Goals Pt Will Perform Grooming: sitting;with set-up;with supervision Pt Will Perform Lower Body Dressing: sit to/from stand;with supervision;with set-up;with adaptive equipment (c LRAD PRN) Pt Will Transfer to Toilet: ambulating;regular height toilet;with supervision;with set-up (c LRAD PRN) Pt Will Perform Toileting - Clothing Manipulation and hygiene: sit to/from stand;with set-up;with supervision (c LRAD PRN)   OT Frequency:  Min 2X/week    Co-evaluation   Reason for Co-Treatment: For patient/therapist safety PT goals addressed during session: Mobility/safety with mobility OT goals addressed during session: ADL's and self-care  AM-PAC OT 6 Clicks Daily Activity     Outcome Measure Help from another person eating meals?: A Little Help from another person taking care of personal grooming?: A Little Help from another  person toileting, which includes using toliet, bedpan, or urinal?: A Lot Help from another person bathing (including washing, rinsing, drying)?: A Lot Help from another person to put on and taking off regular upper body clothing?: A Lot Help from another person to put on and taking off regular lower body clothing?: A Lot 6 Click Score: 14   End of Session Equipment Utilized During Treatment: Rolling walker (2 wheels);Gait belt Nurse Communication: Mobility status  Activity Tolerance: Patient tolerated treatment well Patient left: in bed;with call bell/phone within reach;with bed alarm set  OT Visit Diagnosis: Other symptoms and signs involving cognitive function;Other abnormalities of gait and mobility (R26.89)                Time: 9077-9059 OT Time Calculation (min): 18 min Charges:  OT General Charges $OT Visit: 1 Visit OT Evaluation $OT Eval Moderate Complexity: 1 Mod  Jhonny Pelton, M.S., OTR/L 06/01/24, 12:39 PM

## 2024-06-02 DIAGNOSIS — G9341 Metabolic encephalopathy: Secondary | ICD-10-CM | POA: Diagnosis not present

## 2024-06-02 LAB — COMPREHENSIVE METABOLIC PANEL WITH GFR
ALT: 43 U/L (ref 0–44)
AST: 55 U/L — ABNORMAL HIGH (ref 15–41)
Albumin: 2.4 g/dL — ABNORMAL LOW (ref 3.5–5.0)
Alkaline Phosphatase: 79 U/L (ref 38–126)
Anion gap: 12 (ref 5–15)
BUN: 21 mg/dL — ABNORMAL HIGH (ref 6–20)
CO2: 22 mmol/L (ref 22–32)
Calcium: 8.3 mg/dL — ABNORMAL LOW (ref 8.9–10.3)
Chloride: 106 mmol/L (ref 98–111)
Creatinine, Ser: 0.66 mg/dL (ref 0.44–1.00)
GFR, Estimated: >60 mL/min (ref >60)
Glucose, Bld: 163 mg/dL — ABNORMAL HIGH (ref 70–99)
Potassium: 2.3 mmol/L — CL (ref 3.5–5.1)
Sodium: 140 mmol/L (ref 135–145)
Total Bilirubin: 0.6 mg/dL (ref 0.0–1.2)
Total Protein: 5.4 g/dL — ABNORMAL LOW (ref 6.5–8.1)

## 2024-06-02 LAB — MAGNESIUM: Magnesium: 1.8 mg/dL (ref 1.7–2.4)

## 2024-06-02 LAB — PHOSPHORUS: Phosphorus: 2.4 mg/dL — ABNORMAL LOW (ref 2.5–4.6)

## 2024-06-02 LAB — CBC WITH DIFFERENTIAL/PLATELET
Abs Immature Granulocytes: 0.13 K/uL — ABNORMAL HIGH (ref 0.00–0.07)
Basophils Absolute: 0.1 K/uL (ref 0.0–0.1)
Basophils Relative: 1 %
Eosinophils Absolute: 0 K/uL (ref 0.0–0.5)
Eosinophils Relative: 0 %
HCT: 31.7 % — ABNORMAL LOW (ref 36.0–46.0)
Hemoglobin: 10.9 g/dL — ABNORMAL LOW (ref 12.0–15.0)
Immature Granulocytes: 1 %
Lymphocytes Relative: 3 %
Lymphs Abs: 0.4 K/uL — ABNORMAL LOW (ref 0.7–4.0)
MCH: 29.3 pg (ref 26.0–34.0)
MCHC: 34.4 g/dL (ref 30.0–36.0)
MCV: 85.2 fL (ref 80.0–100.0)
Monocytes Absolute: 0.3 K/uL (ref 0.1–1.0)
Monocytes Relative: 2 %
Neutro Abs: 12.8 K/uL — ABNORMAL HIGH (ref 1.7–7.7)
Neutrophils Relative %: 93 %
Platelets: 225 K/uL (ref 150–400)
RBC: 3.72 MIL/uL — ABNORMAL LOW (ref 3.87–5.11)
RDW: 18.3 % — ABNORMAL HIGH (ref 11.5–15.5)
Smear Review: NORMAL
WBC: 13.6 K/uL — ABNORMAL HIGH (ref 4.0–10.5)
nRBC: 0 % (ref 0.0–0.2)

## 2024-06-02 LAB — GLUCOSE, CAPILLARY
Glucose-Capillary: 129 mg/dL — ABNORMAL HIGH (ref 70–99)
Glucose-Capillary: 161 mg/dL — ABNORMAL HIGH (ref 70–99)
Glucose-Capillary: 170 mg/dL — ABNORMAL HIGH (ref 70–99)

## 2024-06-02 MED ORDER — IPRATROPIUM-ALBUTEROL 0.5-2.5 (3) MG/3ML IN SOLN: 3.0000 mL | Freq: Four times a day (QID) | RESPIRATORY_TRACT | Status: DC | PRN

## 2024-06-02 MED ORDER — POTASSIUM CHLORIDE 10 MEQ/100ML IV SOLN
10.0000 meq | INTRAVENOUS | Status: AC
  Administered 2024-06-02 (×6): 10 meq via INTRAVENOUS
  Filled 2024-06-02 (×6): qty 100

## 2024-06-02 MED ORDER — OXYCODONE HCL 5 MG PO TABS
5.0000 mg | ORAL_TABLET | Freq: Four times a day (QID) | ORAL | Status: DC | PRN
  Administered 2024-06-02 – 2024-06-03 (×4): 5 mg via ORAL
  Filled 2024-06-02 (×4): qty 1

## 2024-06-02 MED ORDER — MIDODRINE HCL 5 MG PO TABS
5.0000 mg | ORAL_TABLET | Freq: Three times a day (TID) | ORAL | Status: DC
  Filled 2024-06-02: qty 1

## 2024-06-02 NOTE — Progress Notes (Signed)
 Critical Potassium 2.3. Attending notified via page.

## 2024-06-02 NOTE — Plan of Care (Signed)

## 2024-06-02 NOTE — Progress Notes (Signed)
 PROGRESS NOTE    Erica [name]  FMW:[id number] DOB: 1966-07-12 DOA: 05/31/2024 PCP: Tobie Domino, MD  Chief Complaint  Patient presents with   Drug Overdose    Hospital Course:  Erica [name] is a 58 y.o. female with medical history significant of stage IV rectal cancer, CHF, COPD, diabetes, hyperlipidemia, hypertension, anxiety, arthritis, tobacco abuse, opioid use, polypharmacy. Patient presents to the ED after wellness check was called and EMS found patient unresponsive with agonal respirations.  She was given naloxone  in EMS with rapid improvement.  On arrival to the ED she was given a repeat dose with improvement.  Patient reports that she has been taking her 50 mg oxycodone  3 times daily, has also had some alcohol.  She cannot quantify how much. Vitals on arrival are tachycardic 113, tachypneic 22, initial BP 126/67. Labs reveal hyponatremia with sodium of 131, creatinine 1.41, anion gap acidosis with lactic acid 2.0, bicarb 17, tox screen negative.  Head CT without acute abnormality.  CXR with right airspace opacity suspicious for aspiration pneumonia. TRH was called for admission.  She was started on ceftriaxone, azithromycin , Flagyl . Lactic acid continued to rise overnight and blood pressure was persistently low.  MRSA swab resulted positive.  Vancomycin  added 1/14.  Subjective: More alert this morning.  She denies any acute issues.  We did discuss her family members and she has requested we do not call and update her daughters with whom she is estranged.  She has consented for us  to call her friends Izetta and Sari.  Phone number is now provided and in chart.  Objective: Vitals:   06/02/24 0750 06/02/24 1229 06/02/24 1557 06/02/24 1558  BP: (!) 143/70 (!) 142/85 (!) 149/75 (!) 149/75  Pulse: 88 84 87 88  Resp: 16  17 17   Temp: 98 F (36.7 C)  (!) 97.5 F (36.4 C) (!) 97.5 F (36.4 C)  TempSrc:      SpO2: 97%  97% 97%  Weight:      Height:        Intake/Output Summary  (Last 24 hours) at 06/02/2024 1719 Last data filed at 06/02/2024 1525 Gross per 24 hour  Intake 2103.25 ml  Output --  Net 2103.25 ml   Filed Weights   05/31/24 1023  Weight: 65 kg    Examination: General exam: Appears calm and comfortable, NAD, frail-appearing Respiratory system: No work of breathing, 2 L nasal cannula in place, still with some rhonchi, wheezing improved Cardiovascular system: S1 & S2 heard, RRR.  Gastrointestinal system: Abdomen is nondistended, soft and nontender.  Neuro: Alert, oriented x 4  Assessment & Plan:  Principal Problem:   Altered mental status    Acute metabolic encephalopathy Unintentional drug overdose suspected - Likely secondary to polypharmacy, high opioid burden and alcohol mix - Appears to be resolving to her baseline now - Head CT on arrival without acute intracranial abnormality. - Tylenol , salicylate, EtOH all WNL. - Ammonia WNL - TSH very low, T4 WNL.  Will hold off on treatment in this acute setting for now.  Will need close outpatient follow-up with PCP to discuss repeat TSH - High-sensitivity troponin 71 with downtrend on repeat - Follow-up blood cultures, currently negative - UDS negative (obtained over 24 hours after admission) - UA not consistent with infection - Patient was responsive to Narcan  which was administered both with EMS and ED - PT/OT evaluations and treatment.  Anticipate she would benefit from SNF stay   Hypotension - Patient is persistently hypotensive with  lactic acidosis suspect which I suspect was related to opioid overdose.  Does not have fever, leukocytosis, or tachycardia.  Does not meet sepsis criteria at this time - Status post fluid bolus and maintenance fluids. - Was on midodrine but blood pressure is now elevated and midodrine has been discontinued.  Polypharmacy - Patient has chronic pain secondary to rectal cancer.  She has had multiple prior admissions for altered mental status.  We will need to  consider discontinuation or at least significantly reducing her oxycodone .  She is also taking clonazepam , zolpidem Lyrica , and alcohol. - I discussed this with her directly and she is amendable to cutting back on some meds.  Chronic pain - Patient is currently receiving high-dose opioid therapy from oncology NP for neoplasm related pain.  On review of oncology notes malignancy was adequately treated without obvious residual on CT as recent as May 2025.  When discussing this with the patient she reports that she has multiple other areas of chronic pain - States she is more alert and now reporting severe pain.  Will start Oxy at a very low dose, come up with plan for total discontinuation outpatient   Pneumonia - CXR with right sided infiltrate.  Diffuse rhonchi and wheezing on exam - Suspect this is aspiration pneumonia given encephalopathy on arrival with agonal respirations but cannot rule out community-acquired pneumonia prior to this episode.  Initially covered with ceftriaxone azithromycin  and Flagyl .  Lactic acid is rising and patient is persistently hypotensive.  MRSA PCR positive.  Have added vancomycin  per pharmacy dosing.  PCN allergy - Flu/COVID/RSV negative - Remains afebrile.  Leukocytosis unreliable secondary to steroid therapy   AKI - Baseline creatinine 0.74, 1.41 on arrival -Has resolved to baseline now.  IV fluids discontinued.  Encourage p.o. intake   Lactic acidosis -Blood cultures without growth x 2 days follow-up to completion - Lactic acidosis may have been related to pneumonia, perhaps complicated by alcohol use with starvation ketoacidosis - Antibiotic therapy as above.   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 - Completed 8 cycles of  chemotherapy in March 2025 with good response including significant reduction in lung mass and no obvious residual malignancy on abdominal CT. -  Has been seeing palliative for cancer related pain management - On chart review it does not appear that she has clear goals of care plan.  Treatment appears to have been impacted by housing instability and financial difficulties - Will ensure close outpatient follow-up with oncologist   Depression Anxiety - Resume home dose duloxetine  - Try to avoid sedating medications, will allow for very low-dose Ativan  to avoid benzo withdrawal   Alcohol abuse - Patient drinks vodka daily, she does endorse drinking today.  EtOH undetectable on arrival - Will initiate CIWA protocol, high risk of withdrawal - Thiamine and folate supplementation.  HF Preserved EF, grade 1 diastolic dysfunction - Most recent echocardiogram May 2025: LVEF 60 to 65%, diastolic parameters indeterminate - Follows outpatient with Dr. Gollan -On diltiazem  at home for palpitations.  Holding for now given blood pressure, resume as BP tolerates   Diabetes - Hgb A1c 5.2 - Some hyperglycemia this admission secondary to steroid use.  No indication for consistent sliding scale.  Will discontinue    Hyperlipidemia -  Continue statin   Tobacco abuse - Continue nicotine  patch.  Has been counseled on cessation  I did try to reach patient's friends Izetta and Sari with no answer.  DVT prophylaxis: Heparin    Code Status: Full Code Disposition: Inpatient pending clinical resolution  Consultants:    Procedures:    Antimicrobials:  Anti-infectives (From admission, onward)    Start     Dose/Rate Route Frequency Ordered Stop   06/02/24 1000  vancomycin  (VANCOREADY) IVPB 1250 mg/250 mL        1,250 mg 166.7 mL/hr over 90 Minutes Intravenous Every 24 hours 06/01/24 1000     06/01/24 0900  vancomycin  (VANCOREADY) IVPB 1500 mg/300 mL        1,500 mg 150 mL/hr over 120 Minutes  Intravenous  Once 06/01/24 0811 06/01/24 1425   05/31/24 2000  metroNIDAZOLE  (FLAGYL ) IVPB 500 mg        500 mg 100 mL/hr over 60 Minutes Intravenous Every 12 hours 05/31/24 1736     05/31/24 1815  azithromycin  (ZITHROMAX ) 500 mg in sodium chloride  0.9 % 250 mL IVPB        500 mg 250 mL/hr over 60 Minutes Intravenous Every 24 hours 05/31/24 1722 06/03/24 1814   05/31/24 1800  cefTRIAXone (ROCEPHIN) 2 g in sodium chloride  0.9 % 100 mL IVPB        2 g 200 mL/hr over 30 Minutes Intravenous Every 24 hours 05/31/24 1736     05/31/24 1330  meropenem  (MERREM ) 1 g in sodium chloride  0.9 % 100 mL IVPB        1 g 200 mL/hr over 30 Minutes Intravenous  Once 05/31/24 1325 05/31/24 1407       Data Reviewed: I have personally reviewed following labs and imaging studies CBC: Recent Labs  Lab 05/31/24 1051 06/01/24 0413 06/02/24 0929  WBC 6.7 5.2 13.6*  NEUTROABS  --   --  12.8*  HGB 10.7* 9.7* 10.9*  HCT 32.4* 28.0* 31.7*  MCV 89.8 85.9 85.2  PLT 158 158 225   Basic Metabolic Panel: Recent Labs  Lab 05/31/24 1051 06/01/24 0413 06/02/24 0529 06/02/24 0929  NA 131* 135  --  140  K 4.2 3.3*  --  2.3*  CL 97* 101  --  106  CO2 17* 21*  --  22  GLUCOSE 160* 157*  --  163*  BUN 28* 23*  --  21*  CREATININE 1.41* 0.92  --  0.66  CALCIUM  8.6* 8.5*  --  8.3*  MG  --  1.5* 1.8  --   PHOS  --  3.1 2.4*  --    GFR: Estimated Creatinine Clearance: 76.4 mL/min (by C-G formula based on SCr of 0.66 mg/dL). Liver Function Tests: Recent Labs  Lab 05/31/24 1051 06/01/24 0413 06/02/24 0929  AST 61* 80* 55*  ALT 33 42 43  ALKPHOS 91 58 79  BILITOT 1.6* 1.0 0.6  PROT 6.5 5.6* 5.4*  ALBUMIN 3.1* 2.5* 2.4*   CBG: Recent Labs  Lab 06/01/24 1643 06/01/24 2124 06/02/24 0751 06/02/24 1141 06/02/24 1651  GLUCAP 151* 144* 129* 161* 170*    Recent Results (from the past 240 hours)  Blood Culture (routine x 2)     Status: None (Preliminary result)   Collection Time: 05/31/24  1:27 PM    Specimen: BLOOD  Result Value Ref Range Status   Specimen Description BLOOD BLOOD RIGHT HAND  Final   Special Requests   Final    BOTTLES DRAWN AEROBIC  AND ANAEROBIC Blood Culture results may not be optimal due to an inadequate volume of blood received in culture bottles   Culture   Final    NO GROWTH 2 DAYS Performed at [facility], [address] [place], [place] [address]    Report Status PENDING  Incomplete  Blood Culture (routine x 2)     Status: None (Preliminary result)   Collection Time: 05/31/24  1:27 PM   Specimen: BLOOD  Result Value Ref Range Status   Specimen Description BLOOD BLOOD RIGHT ARM  Final   Special Requests   Final    BOTTLES DRAWN AEROBIC AND ANAEROBIC Blood Culture results may not be optimal due to an inadequate volume of blood received in culture bottles   Culture   Final    NO GROWTH 2 DAYS Performed at [facility], [address] [place], [place] [address]    Report Status PENDING  Incomplete  Resp panel by RT-PCR (RSV, Flu A&B, Covid) Anterior Nasal Swab     Status: None   Collection Time: 05/31/24  5:19 PM   Specimen: Anterior Nasal Swab  Result Value Ref Range Status   SARS Coronavirus 2 by RT PCR NEGATIVE NEGATIVE Final    Comment: (NOTE) SARS-CoV-2 target nucleic acids are NOT DETECTED.  The SARS-CoV-2 RNA is generally detectable in upper respiratory specimens during the acute phase of infection. The lowest concentration of SARS-CoV-2 viral copies this assay can detect is 138 copies/mL. A negative result does not preclude SARS-Cov-2 infection and should not be used as the sole basis for treatment or other patient management decisions. A negative result may occur with  improper specimen collection/handling, submission of specimen other than nasopharyngeal swab, presence of viral mutation(s) within the areas targeted by this assay, and inadequate number of viral copies(<138 copies/mL). A negative result must be combined  with clinical observations, patient history, and epidemiological information. The expected result is Negative.  Fact Sheet for Patients:  bloggercourse.com  Fact Sheet for Healthcare Providers:  seriousbroker.it  This test is no t yet approved or cleared by the [place]  FDA and  has been authorized for detection and/or diagnosis of SARS-CoV-2 by FDA under an Emergency Use Authorization (EUA). This EUA will remain  in effect (meaning this test can be used) for the duration of the COVID-19 declaration under Section 564(b)(1) of the Act, 21 U.S.C.section 360bbb-3(b)(1), unless the authorization is terminated  or revoked sooner.       Influenza A by PCR NEGATIVE NEGATIVE Final   Influenza B by PCR NEGATIVE NEGATIVE Final    Comment: (NOTE) The Xpert Xpress SARS-CoV-2/FLU/RSV plus assay is intended as an aid in the diagnosis of influenza from Nasopharyngeal swab specimens and should not be used as a sole basis for treatment. Nasal washings and aspirates are unacceptable for Xpert Xpress SARS-CoV-2/FLU/RSV testing.  Fact Sheet for Patients: bloggercourse.com  Fact Sheet for Healthcare Providers: seriousbroker.it  This test is not yet approved or cleared by the [place]  FDA and has been authorized for detection and/or diagnosis of SARS-CoV-2 by FDA under an Emergency Use Authorization (EUA). This EUA will remain in effect (meaning this test can be used) for the duration of the COVID-19 declaration under Section 564(b)(1) of the Act, 21 U.S.C. section 360bbb-3(b)(1), unless the authorization is terminated or revoked.     Resp Syncytial Virus by PCR NEGATIVE NEGATIVE Final    Comment: (NOTE) Fact Sheet for Patients: bloggercourse.com  Fact Sheet for Healthcare Providers: seriousbroker.it  This  test is not yet approved  or cleared by the [place]  FDA and has been authorized for detection and/or diagnosis of SARS-CoV-2 by FDA under an Emergency Use Authorization (EUA). This EUA will remain in effect (meaning this test can be used) for the duration of the COVID-19 declaration under Section 564(b)(1) of the Act, 21 U.S.C. section 360bbb-3(b)(1), unless the authorization is terminated or revoked.  Performed at [facility], [address] [place], [place] [address]   MRSA Next Gen by PCR, Nasal     Status: Abnormal   Collection Time: 05/31/24  5:19 PM   Specimen: Anterior Nasal Swab  Result Value Ref Range Status   MRSA by PCR Next Gen DETECTED (A) NOT DETECTED Final    Comment: RESULT CALLED TO, READ BACK BY AND VERIFIED WITH: VANESSA GRAHAM @2057  ON 05/31/24 SKL (NOTE) The GeneXpert MRSA Assay (FDA approved for NASAL specimens only), is one component of a comprehensive MRSA colonization surveillance program. It is not intended to diagnose MRSA infection nor to guide or monitor treatment for MRSA infections. Test performance is not FDA approved in patients less than 57 years old. Performed at [facility], [address], [place], [place] [address]      Radiology Studies: No results found.   Scheduled Meds:  busPIRone   30 mg Oral BID   Chlorhexidine  Gluconate Cloth  6 each Topical Daily   DULoxetine   60 mg Oral BID   feeding supplement  237 mL Oral BID BM   fluticasone  furoate-vilanterol  1 puff Inhalation Daily   folic acid  1 mg Oral Daily   heparin   5,000 Units Subcutaneous Q8H   influenza vac split trivalent PF  0.5 mL Intramuscular Tomorrow-1000   insulin  aspart  0-6 Units Subcutaneous TID WC   midodrine  5 mg Oral TID WC   multivitamin with minerals  1 tablet Oral Daily   nicotine   14 mg Transdermal Daily   pantoprazole   40 mg Oral Daily   predniSONE   50 mg Oral Q breakfast   sodium chloride  flush  10-40 mL Intracatheter Q12H   thiamine  100 mg Oral Daily    Or   thiamine  100 mg Intravenous Daily   Continuous Infusions:  azithromycin  500 mg (06/01/24 1717)   cefTRIAXone (ROCEPHIN)  IV 2 g (06/01/24 1719)   metronidazole  500 mg (06/02/24 0914)   potassium chloride  10 mEq (06/02/24 1639)   vancomycin  1,250 mg (06/02/24 0920)     LOS: 2 days  MDM: Patient is high risk for one or more organ failure.  They necessitate ongoing hospitalization for continued IV therapies and subsequent lab monitoring. Total time spent interpreting labs and vitals, reviewing the medical record, coordinating care amongst consultants and care team members, directly assessing and discussing care with the patient and/or family: 55 min  Cesia Orf, DO [facility]  To contact the attending physician between 7A-7P please use Epic Chat. To contact the covering physician during after hours 7P-7A, please review Amion.  06/02/2024, 5:19 PM   *This document has been created with the assistance of dictation software. Please excuse typographical errors. *

## 2024-06-02 NOTE — Plan of Care (Signed)

## 2024-06-03 DIAGNOSIS — G9341 Metabolic encephalopathy: Secondary | ICD-10-CM | POA: Diagnosis not present

## 2024-06-03 LAB — COMPREHENSIVE METABOLIC PANEL WITH GFR
ALT: 37 U/L (ref 0–44)
AST: 31 U/L (ref 15–41)
Albumin: 2.5 g/dL — ABNORMAL LOW (ref 3.5–5.0)
Alkaline Phosphatase: 252 U/L — ABNORMAL HIGH (ref 38–126)
Anion gap: 13 (ref 5–15)
BUN: 18 mg/dL (ref 6–20)
CO2: 26 mmol/L (ref 22–32)
Calcium: 8.4 mg/dL — ABNORMAL LOW (ref 8.9–10.3)
Chloride: 100 mmol/L (ref 98–111)
Creatinine, Ser: 0.61 mg/dL (ref 0.44–1.00)
GFR, Estimated: >60 mL/min (ref >60)
Glucose, Bld: 108 mg/dL — ABNORMAL HIGH (ref 70–99)
Potassium: 2.5 mmol/L — CL (ref 3.5–5.1)
Sodium: 140 mmol/L (ref 135–145)
Total Bilirubin: 0.5 mg/dL (ref 0.0–1.2)
Total Protein: 5.6 g/dL — ABNORMAL LOW (ref 6.5–8.1)

## 2024-06-03 LAB — CBC WITH DIFFERENTIAL/PLATELET
Abs Immature Granulocytes: 0.18 K/uL — ABNORMAL HIGH (ref 0.00–0.07)
Basophils Absolute: 0.1 K/uL (ref 0.0–0.1)
Basophils Relative: 1 %
Eosinophils Absolute: 0 K/uL (ref 0.0–0.5)
Eosinophils Relative: 0 %
HCT: 32.6 % — ABNORMAL LOW (ref 36.0–46.0)
Hemoglobin: 11.2 g/dL — ABNORMAL LOW (ref 12.0–15.0)
Immature Granulocytes: 2 %
Lymphocytes Relative: 5 %
Lymphs Abs: 0.5 K/uL — ABNORMAL LOW (ref 0.7–4.0)
MCH: 29.2 pg (ref 26.0–34.0)
MCHC: 34.4 g/dL (ref 30.0–36.0)
MCV: 85.1 fL (ref 80.0–100.0)
Monocytes Absolute: 0.4 K/uL (ref 0.1–1.0)
Monocytes Relative: 3 %
Neutro Abs: 10.2 K/uL — ABNORMAL HIGH (ref 1.7–7.7)
Neutrophils Relative %: 89 %
Platelets: 259 K/uL (ref 150–400)
RBC: 3.83 MIL/uL — ABNORMAL LOW (ref 3.87–5.11)
RDW: 18.4 % — ABNORMAL HIGH (ref 11.5–15.5)
Smear Review: NORMAL
WBC: 11.4 K/uL — ABNORMAL HIGH (ref 4.0–10.5)
nRBC: 0 % (ref 0.0–0.2)

## 2024-06-03 LAB — GLUCOSE, CAPILLARY: Glucose-Capillary: 122 mg/dL — ABNORMAL HIGH (ref 70–99)

## 2024-06-03 LAB — POTASSIUM: Potassium: 3.1 mmol/L — ABNORMAL LOW (ref 3.5–5.1)

## 2024-06-03 LAB — PHOSPHORUS: Phosphorus: 2.4 mg/dL — ABNORMAL LOW (ref 2.5–4.6)

## 2024-06-03 LAB — MAGNESIUM: Magnesium: 1.7 mg/dL (ref 1.7–2.4)

## 2024-06-03 MED ORDER — POTASSIUM CHLORIDE 20 MEQ PO PACK: 40.0000 meq | PACK | Freq: Once | ORAL | Status: DC

## 2024-06-03 MED ORDER — POTASSIUM CITRATE-CITRIC ACID 1100-334 MG/5ML PO SOLN
40.0000 meq | Freq: Three times a day (TID) | ORAL | Status: DC
  Administered 2024-06-03 – 2024-06-04 (×4): 40 meq via ORAL
  Filled 2024-06-03 (×7): qty 20

## 2024-06-03 MED ORDER — POTASSIUM CHLORIDE 20 MEQ PO PACK
40.0000 meq | PACK | Freq: Once | ORAL | Status: AC
  Administered 2024-06-03: 40 meq via ORAL
  Filled 2024-06-03: qty 2

## 2024-06-03 MED ORDER — OXYCODONE HCL 5 MG PO TABS: 10.0000 mg | ORAL_TABLET | Freq: Four times a day (QID) | ORAL | Status: DC | PRN

## 2024-06-03 MED ORDER — OXYCODONE HCL 5 MG PO TABS
10.0000 mg | ORAL_TABLET | Freq: Three times a day (TID) | ORAL | Status: DC | PRN
  Administered 2024-06-03 – 2024-06-04 (×3): 10 mg via ORAL
  Filled 2024-06-03 (×3): qty 2

## 2024-06-03 MED ORDER — DILTIAZEM HCL ER COATED BEADS 120 MG PO CP24
120.0000 mg | ORAL_CAPSULE | Freq: Every day | ORAL | Status: DC
  Administered 2024-06-03 – 2024-06-04 (×2): 120 mg via ORAL
  Filled 2024-06-03 (×2): qty 1

## 2024-06-03 NOTE — Progress Notes (Signed)
 PROGRESS NOTE    Erica [name]  FMW:[id number] DOB: 02-Jun-1966 DOA: 05/31/2024 PCP: Tobie Domino, MD  Chief Complaint  Patient presents with   Drug Overdose    Hospital Course:  Erica [name] is a 58 y.o. female with medical history significant of stage IV rectal cancer, CHF, COPD, diabetes, hyperlipidemia, hypertension, anxiety, arthritis, tobacco abuse, opioid use, polypharmacy. Patient presents to the ED after wellness check was called and EMS found patient unresponsive with agonal respirations.  She was given naloxone  in EMS with rapid improvement.  On arrival to the ED she was given a repeat dose with improvement.  Patient reports that she has been taking her 50 mg oxycodone  3 times daily, has also had some alcohol.  She cannot quantify how much. Vitals on arrival are tachycardic 113, tachypneic 22, initial BP 126/67. Labs reveal hyponatremia with sodium of 131, creatinine 1.41, anion gap acidosis with lactic acid 2.0, bicarb 17, tox screen negative.  Head CT without acute abnormality.  CXR with right airspace opacity suspicious for aspiration pneumonia. TRH was called for admission.  She was started on ceftriaxone, azithromycin , Flagyl . Lactic acid continued to rise overnight and blood pressure was persistently low.  MRSA swab resulted positive.  Vancomycin  added 1/14.  Subjective: No acute events overnight.  Patient is anxious to get home.  She does report the 5 mg of oxycodone  does not appear sufficient and she would like to go back to the 10 mg.  Objective: Vitals:   06/02/24 2109 06/03/24 0423 06/03/24 0751 06/03/24 1451  BP: (!) 140/81 (!) 150/73 (!) 147/87 (!) 152/93  Pulse: 89 76 81 93  Resp: 18 18 17 17   Temp: 97.9 F (36.6 C) 98.1 F (36.7 C) 97.6 F (36.4 C) 97.7 F (36.5 C)  TempSrc:      SpO2: 99% 97% 98% 96%  Weight:      Height:        Intake/Output Summary (Last 24 hours) at 06/03/2024 1535 Last data filed at 06/03/2024 1500 Gross per 24 hour  Intake  480 ml  Output --  Net 480 ml   Filed Weights   05/31/24 1023  Weight: 65 kg    Examination: General exam: Appears calm and comfortable, NAD, frail-appearing Respiratory system: No work of breathing, 2 L nasal cannula in place, still with some rhonchi, wheezing improved Cardiovascular system: S1 & S2 heard, RRR.  Gastrointestinal system: Abdomen is nondistended, soft and nontender.  Neuro: Alert, oriented x 4  Assessment & Plan:  Principal Problem:   Altered mental status    Acute metabolic encephalopathy Unintentional drug overdose suspected - Likely secondary to polypharmacy, high opioid burden and alcohol mix - Appears to be resolving to her baseline now - Head CT on arrival without acute intracranial abnormality. - Tylenol , salicylate, EtOH all WNL. - Ammonia WNL - TSH very low, T4 WNL.  Will hold off on treatment in this acute setting for now.  Will need close outpatient follow-up with PCP to discuss repeat TSH - High-sensitivity troponin 71 with downtrend on repeat - Blood cultures remain negative - UDS negative (obtained over 24 hours after admission) - UA not consistent with infection - Patient was responsive to Narcan  which was administered both with EMS and ED - PT/OT recommending SNF.  Patient refusing.  Prefers home health.  Home health agencies have been consulted.  TOC working on   Hypokalemia - IV replacement again today - Repeat potassium level this afternoon.  Replace as needed.  Hypotension -  Patient is persistently hypotensive with lactic acidosis suspect which I suspect was related to opioid overdose.  Does not have fever, leukocytosis, or tachycardia.  Does not meet sepsis criteria at this time - Status post fluid bolus and maintenance fluids. - Was on midodrine but blood pressure is now elevated and midodrine has been discontinued. - Continue to monitor closely.  Titrate meds as needed  Polypharmacy - Patient has chronic pain secondary to rectal  cancer.  She has had multiple prior admissions for altered mental status.  We will need to consider discontinuation or at least significantly reducing her oxycodone .  She is also taking clonazepam , zolpidem Lyrica , and alcohol. - I discussed this with her directly and she is amendable to cutting back on some meds.  Chronic pain - Patient is currently receiving high-dose opioid therapy from oncology NP for neoplasm related pain.  On review of oncology notes malignancy was adequately treated without obvious residual on CT as recent as May 2025.  When discussing this with the patient she reports that she has multiple other areas of chronic pain - States she is more alert and now reporting severe pain.  Will allow oxy at 10 mg dose, advised patient that this will be the max at home as well until we can taper off slowly.  Pneumonia - CXR with right sided infiltrate.  Diffuse rhonchi and wheezing on exam - Suspect this is aspiration pneumonia given encephalopathy on arrival with agonal respirations but cannot rule out community-acquired pneumonia prior to this episode. - Initially ceftriaxone/azithromycin /Flagyl .  Lactic was rising and persistent hypotension.  MRSA PCR positive.  Vancomycin  added.  Has PCN allergy - Flu/COVID/RSV negative - Remains afebrile.  Leukocytosis unreliable secondary to steroid therapy   AKI - Baseline creatinine 0.74, 1.41 on arrival -Has resolved to baseline now.  IV fluids discontinued.  Encourage p.o. intake   Lactic acidosis -Blood cultures without growth x3 days follow-up to completion - Lactic acidosis may have been related to pneumonia, perhaps complicated by alcohol use with starvation ketoacidosis - Antibiotic therapy as above.   COPD Chronic respiratory failure - Chronically on 2 L O2 - Does appear to have a new pneumonia and diffuse wheezing on exam - Continue steroid therapy - Continue scheduled DuoNebs - Continue home inhalers.  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 - Completed 8 cycles of chemotherapy in March 2025 with good response including significant reduction in lung mass and no obvious residual malignancy on abdominal CT. -  Has been seeing palliative for cancer related pain management - On chart review it does not appear that she has clear goals of care plan.  Treatment appears to have been impacted by housing instability and financial difficulties - Will ensure close outpatient follow-up with oncologist   Depression Anxiety - Resume home dose duloxetine  - Try to avoid sedating medications, will allow for very low-dose Ativan  to avoid benzo withdrawal   Alcohol abuse - Patient drinks vodka daily, she does endorse drinking today.  EtOH undetectable on arrival - Will initiate CIWA protocol, high risk of withdrawal - Thiamine and folate supplementation. - Currently no evidence of withdrawal.  Has not needed as needed Ativan   HF Preserved EF, grade 1 diastolic dysfunction - Most recent echocardiogram May 2025: LVEF 60 to 65%, diastolic parameters indeterminate - Follows outpatient with Dr. Gollan -On diltiazem  at home for palpitations.  Resume today given rising BP  Diabetes - Hgb  A1c 5.2 - Some hyperglycemia this admission secondary to steroid use.  No indication for consistent sliding scale.  Will discontinue   Hyperlipidemia - Continue statin   Tobacco abuse - Continue nicotine  patch.  Has been counseled on cessation  Per pt request, I tried again to reach patient's friends Izetta and Sari with no answer.  DVT prophylaxis: Heparin    Code Status: Full Code Disposition: Inpatient pending clinical resolution. Likely DC in AM with HH if agency is secured   Consultants:    Procedures:    Antimicrobials:  Anti-infectives (From admission, onward)    Start     Dose/Rate Route Frequency Ordered Stop   06/02/24 1000  vancomycin   (VANCOREADY) IVPB 1250 mg/250 mL        1,250 mg 166.7 mL/hr over 90 Minutes Intravenous Every 24 hours 06/01/24 1000     06/01/24 0900  vancomycin  (VANCOREADY) IVPB 1500 mg/300 mL        1,500 mg 150 mL/hr over 120 Minutes Intravenous  Once 06/01/24 0811 06/01/24 1425   05/31/24 2000  metroNIDAZOLE  (FLAGYL ) IVPB 500 mg        500 mg 100 mL/hr over 60 Minutes Intravenous Every 12 hours 05/31/24 1736     05/31/24 1815  azithromycin  (ZITHROMAX ) 500 mg in sodium chloride  0.9 % 250 mL IVPB        500 mg 250 mL/hr over 60 Minutes Intravenous Every 24 hours 05/31/24 1722 06/02/24 1843   05/31/24 1800  cefTRIAXone (ROCEPHIN) 2 g in sodium chloride  0.9 % 100 mL IVPB        2 g 200 mL/hr over 30 Minutes Intravenous Every 24 hours 05/31/24 1736     05/31/24 1330  meropenem  (MERREM ) 1 g in sodium chloride  0.9 % 100 mL IVPB        1 g 200 mL/hr over 30 Minutes Intravenous  Once 05/31/24 1325 05/31/24 1407       Data Reviewed: I have personally reviewed following labs and imaging studies CBC: Recent Labs  Lab 05/31/24 1051 06/01/24 0413 06/02/24 0929 06/03/24 0446  WBC 6.7 5.2 13.6* 11.4*  NEUTROABS  --   --  12.8* 10.2*  HGB 10.7* 9.7* 10.9* 11.2*  HCT 32.4* 28.0* 31.7* 32.6*  MCV 89.8 85.9 85.2 85.1  PLT 158 158 225 259   Basic Metabolic Panel: Recent Labs  Lab 05/31/24 1051 06/01/24 0413 06/02/24 0529 06/02/24 0929 06/03/24 0446  NA 131* 135  --  140 140  K 4.2 3.3*  --  2.3* 2.5*  CL 97* 101  --  106 100  CO2 17* 21*  --  22 26  GLUCOSE 160* 157*  --  163* 108*  BUN 28* 23*  --  21* 18  CREATININE 1.41* 0.92  --  0.66 0.61  CALCIUM  8.6* 8.5*  --  8.3* 8.4*  MG  --  1.5* 1.8  --  1.7  PHOS  --  3.1 2.4*  --  2.4*   GFR: Estimated Creatinine Clearance: 76.4 mL/min (by C-G formula based on SCr of 0.61 mg/dL). Liver Function Tests: Recent Labs  Lab 05/31/24 1051 06/01/24 0413 06/02/24 0929 06/03/24 0446  AST 61* 80* 55* 31  ALT 33 42 43 37  ALKPHOS 91 58 79 252*   BILITOT 1.6* 1.0 0.6 0.5  PROT 6.5 5.6* 5.4* 5.6*  ALBUMIN 3.1* 2.5* 2.4* 2.5*   CBG: Recent Labs  Lab 06/01/24 2124 06/02/24 0751 06/02/24 1141 06/02/24 1651 06/03/24 0750  GLUCAP 144* 129* 161*  170* 122*    Recent Results (from the past 240 hours)  Blood Culture (routine x 2)     Status: None (Preliminary result)   Collection Time: 05/31/24  1:27 PM   Specimen: BLOOD  Result Value Ref Range Status   Specimen Description BLOOD BLOOD RIGHT HAND  Final   Special Requests   Final    BOTTLES DRAWN AEROBIC AND ANAEROBIC Blood Culture results may not be optimal due to an inadequate volume of blood received in culture bottles   Culture   Final    NO GROWTH 2 DAYS Performed at [facility], [address], [place], [place] [address]    Report Status PENDING  Incomplete  Blood Culture (routine x 2)     Status: None (Preliminary result)   Collection Time: 05/31/24  1:27 PM   Specimen: BLOOD  Result Value Ref Range Status   Specimen Description BLOOD BLOOD RIGHT ARM  Final   Special Requests   Final    BOTTLES DRAWN AEROBIC AND ANAEROBIC Blood Culture results may not be optimal due to an inadequate volume of blood received in culture bottles   Culture   Final    NO GROWTH 2 DAYS Performed at [facility], [address] [place], [place] [address]    Report Status PENDING  Incomplete  Resp panel by RT-PCR (RSV, Flu A&B, Covid) Anterior Nasal Swab     Status: None   Collection Time: 05/31/24  5:19 PM   Specimen: Anterior Nasal Swab  Result Value Ref Range Status   SARS Coronavirus 2 by RT PCR NEGATIVE NEGATIVE Final    Comment: (NOTE) SARS-CoV-2 target nucleic acids are NOT DETECTED.  The SARS-CoV-2 RNA is generally detectable in upper respiratory specimens during the acute phase of infection. The lowest concentration of SARS-CoV-2 viral copies this assay can detect is 138 copies/mL. A negative result does not preclude SARS-Cov-2 infection and should not  be used as the sole basis for treatment or other patient management decisions. A negative result may occur with  improper specimen collection/handling, submission of specimen other than nasopharyngeal swab, presence of viral mutation(s) within the areas targeted by this assay, and inadequate number of viral copies(<138 copies/mL). A negative result must be combined with clinical observations, patient history, and epidemiological information. The expected result is Negative.  Fact Sheet for Patients:  bloggercourse.com  Fact Sheet for Healthcare Providers:  seriousbroker.it  This test is no t yet approved or cleared by the [place]  FDA and  has been authorized for detection and/or diagnosis of SARS-CoV-2 by FDA under an Emergency Use Authorization (EUA). This EUA will remain  in effect (meaning this test can be used) for the duration of the COVID-19 declaration under Section 564(b)(1) of the Act, 21 U.S.C.section 360bbb-3(b)(1), unless the authorization is terminated  or revoked sooner.       Influenza A by PCR NEGATIVE NEGATIVE Final   Influenza B by PCR NEGATIVE NEGATIVE Final    Comment: (NOTE) The Xpert Xpress SARS-CoV-2/FLU/RSV plus assay is intended as an aid in the diagnosis of influenza from Nasopharyngeal swab specimens and should not be used as a sole basis for treatment. Nasal washings and aspirates are unacceptable for Xpert Xpress SARS-CoV-2/FLU/RSV testing.  Fact Sheet for Patients: bloggercourse.com  Fact Sheet for Healthcare Providers: seriousbroker.it  This test is not yet approved or cleared by the [place]  FDA and has been authorized for detection and/or diagnosis of SARS-CoV-2 by FDA under an Emergency Use Authorization (EUA).  This EUA will remain in effect (meaning this test can be used) for the duration of the COVID-19 declaration under Section  564(b)(1) of the Act, 21 U.S.C. section 360bbb-3(b)(1), unless the authorization is terminated or revoked.     Resp Syncytial Virus by PCR NEGATIVE NEGATIVE Final    Comment: (NOTE) Fact Sheet for Patients: bloggercourse.com  Fact Sheet for Healthcare Providers: seriousbroker.it  This test is not yet approved or cleared by the [place]  FDA and has been authorized for detection and/or diagnosis of SARS-CoV-2 by FDA under an Emergency Use Authorization (EUA). This EUA will remain in effect (meaning this test can be used) for the duration of the COVID-19 declaration under Section 564(b)(1) of the Act, 21 U.S.C. section 360bbb-3(b)(1), unless the authorization is terminated or revoked.  Performed at [facility], [address] [place], [place] [address]   MRSA Next Gen by PCR, Nasal     Status: Abnormal   Collection Time: 05/31/24  5:19 PM   Specimen: Anterior Nasal Swab  Result Value Ref Range Status   MRSA by PCR Next Gen DETECTED (A) NOT DETECTED Final    Comment: RESULT CALLED TO, READ BACK BY AND VERIFIED WITH: VANESSA GRAHAM @2057  ON 05/31/24 SKL (NOTE) The GeneXpert MRSA Assay (FDA approved for NASAL specimens only), is one component of a comprehensive MRSA colonization surveillance program. It is not intended to diagnose MRSA infection nor to guide or monitor treatment for MRSA infections. Test performance is not FDA approved in patients less than 2 years old. Performed at [facility], [address], [place], [place] [address]      Radiology Studies: No results found.   Scheduled Meds:  busPIRone   30 mg Oral BID   Chlorhexidine  Gluconate Cloth  6 each Topical Daily   citric acid-potassium citrate  40 mEq Oral TID   DULoxetine   60 mg Oral BID   feeding supplement  237 mL Oral BID BM   fluticasone  furoate-vilanterol  1 puff Inhalation Daily   folic acid  1 mg Oral Daily   heparin    5,000 Units Subcutaneous Q8H   influenza vac split trivalent PF  0.5 mL Intramuscular Tomorrow-1000   multivitamin with minerals  1 tablet Oral Daily   nicotine   14 mg Transdermal Daily   pantoprazole   40 mg Oral Daily   predniSONE   50 mg Oral Q breakfast   sodium chloride  flush  10-40 mL Intracatheter Q12H   thiamine  100 mg Oral Daily   Or   thiamine  100 mg Intravenous Daily   Continuous Infusions:  cefTRIAXone (ROCEPHIN)  IV 2 g (06/02/24 1746)   metronidazole  500 mg (06/03/24 0841)   vancomycin  1,250 mg (06/03/24 0836)     LOS: 3 days  MDM: Patient is high risk for one or more organ failure.  They necessitate ongoing hospitalization for continued IV therapies and subsequent lab monitoring. Total time spent interpreting labs and vitals, reviewing the medical record, coordinating care amongst consultants and care team members, directly assessing and discussing care with the patient and/or family: 55 min  Wallie Lagrand, DO [facility]  To contact the attending physician between 7A-7P please use Epic Chat. To contact the covering physician during after hours 7P-7A, please review Amion.  06/03/2024, 3:35 PM   *This document has been created with the assistance of dictation software. Please excuse typographical errors. *

## 2024-06-03 NOTE — Plan of Care (Signed)

## 2024-06-03 NOTE — TOC Initial Note (Addendum)
 Transition of Care [facility]) - Initial/Assessment Note    Patient Details  Name: Erica [name] MRN: [id number] Date of Birth: Dec 19, 1965  Transition of Care [facility]) CM/SW Contact:    Alvia Olam Fabry, RN Phone Number: 06/03/2024, 1:22 PM  Clinical Narrative:                 8:57 RNCM spoke with pt today concerning discharge planning recommendations and needs. Discussed and provided substance abuse and who this puts her at risk with her medical conditions.   Further discussion on the recommendations for SNF however pt declined and wishes to return home. Pt states she lives in a camper with heat and air condition and has several friends to assist with ADLs and transportation home. Pt uses [facility] in [place] and verified her provider as Dr. Lauraine Blanch. Occasionally uses a cane for ambulation and states she has a portable oxygen that does not require refills that is services by [facility] uses at home.  PT has recommended SNF however pt opt to declined this recommendation but receptive to [facility]. RNCM has updated the provider and contacted several agencies for services. Leopoldo Clayborn) left  HIPAA message requesting call back [facility]ru[facility] Bernida) left HIPAA message requesting call back Arlina Mar) who indicated she will confirm with upper management and return a call to [facility] if able to accommodate pt's therapy for [facility].  2:00 pm Received cb from Miller with [facility] who accepted pt for HHPT.  Provider aware and Home Medical Care completed for this selected referral. No DME order however provider aware there was none recommended via PT eval .  3:00 pm pt has declined the OT recommended 3-1 BSC and states she does not use her portable oxygen device upon her discharge as she only on occasions. Address on file verified for HHPT services with [facility].   3:30 pm Spoke with [facility] that they will not services this pt for [facility] PT. RNCM has informed the pt and the provider.   TOC will continue  to follow up accordingly with pending agencies called.  Expected Discharge Plan: Home w Home Health Services (pending if vailable for PT with [facility] if available declined SNF recommendation) Barriers to Discharge: Continued Medical Work up   Patient Goals and CMS Choice            Expected Discharge Plan and Services                                              Prior Living Arrangements/Services                       Activities of Daily Living   ADL Screening (condition at time of admission) Independently performs ADLs?: Yes (appropriate for developmental age) Is the patient deaf or have difficulty hearing?: No Does the patient have difficulty seeing, even when wearing glasses/contacts?: Yes Does the patient have difficulty concentrating, remembering, or making decisions?: Yes  Permission Sought/Granted                  Emotional Assessment              Admission diagnosis:  Somnolence [R40.0] Altered mental status [R41.82] Polypharmacy [Z79.899] Severe sepsis (HCC) [A41.9, R65.20] Aspiration pneumonia of right lower lobe due to vomit [facility]) [J69.0] Patient Active Problem List   Diagnosis Date Noted   Altered  mental status 05/31/2024   Cholecystitis, acute 11/24/2023   Acute cholecystitis 11/23/2023   Malignant neoplasm of overlapping sites of left lung (HCC) 07/06/2023   Port-A-Cath in place 03/03/2023   Goals of care, counseling/discussion 02/04/2023   Lung nodule 01/13/2023   Rectal cancer (HCC) 12/23/2022   Encounter for screening involving social determinants of health (SDoH) 12/23/2022   COPD exacerbation (HCC) 12/23/2022   Noncompliance with medication monitoring regimen 08/11/2022   Failure to attend appointment 08/09/2022   Cellulitis and abscess of leg (Right) 04/22/2022   Elbow effusion (Right) 02/01/2022   Fall (01/17/2022) 01/18/2022   Acute pain of right knee 01/18/2022   Acute right elbow pain 01/18/2022   Acute pain of  right shoulder 01/18/2022   Chronic hand pain (Bilateral) 06/15/2021   Primary osteoarthritis of left knee 02/15/2021   Chronic use of opiate for therapeutic purpose 10/26/2020   Anterolisthesis of cervical spine (C4/C5) (3 mm) 10/02/2020   Retrolisthesis of cervical spine (C5/C6) (2 mm) 10/02/2020   Shortness of breath 08/12/2020   Renal insufficiency 08/12/2020   Pulmonary HTN (HCC) 08/12/2020   Cervicalgia 07/02/2020   Numbness and tingling of upper extremity (Left) 07/02/2020   DDD (degenerative disc disease), cervical 07/02/2020   Cervical radiculopathy (sensory) (Left) 07/02/2020   Uncomplicated opioid dependence (HCC) 03/31/2020   Tricompartmental disease of knee 02/11/2020   Pharmacologic therapy 11/13/2019   Chronic low back pain (Bilateral) w/ sciatica (Bilateral) 08/20/2019   PTSD (post-traumatic stress disorder) 02/20/2019   Chronic musculoskeletal pain 02/20/2019   DDD (degenerative disc disease), lumbosacral 09/19/2018   Osteoarthritis of facet joint of lumbar spine 09/19/2018   Hypokalemia 09/10/2018   COPD (chronic obstructive pulmonary disease) with acute bronchitis (HCC) 08/13/2018   Transaminitis 08/13/2018   Acute pancreatitis 08/13/2018   Elevated LFTs 08/13/2018   Peptic ulcer disease 08/13/2018   COPD (chronic obstructive pulmonary disease) (HCC) 08/13/2018   Mass of chin 05/24/2018   Obesity (BMI 30.0-34.9) 12/05/2017   Primary localized osteoarthrosis, pelvic region and thigh 12/05/2017   Arthropathy of left hip 11/08/2017   Rib pain on right side 11/08/2017   Rectal bleeding 10/17/2017   Urinary retention 10/17/2017   Nicotine  dependence, unspecified, uncomplicated 10/17/2017   Spondylosis without myelopathy or radiculopathy, lumbosacral region 10/06/2017   Dizziness 09/21/2017   Vertigo 09/15/2017   Controlled type 2 diabetes mellitus without complication, without long-term current use of insulin  (HCC) 07/18/2017   HTN (hypertension) 07/18/2017    Chronic hip pain (2ry area of Pain) (Bilateral) (L>R) 05/02/2017   Type 2 diabetes mellitus with hyperglycemia (HCC) 04/14/2017   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) 03/15/2017   Skin lesion 11/25/2016   Hemarthrosis, left knee 11/10/2016   Dyspnea 09/20/2016   Osteoarthritis of knee (Bilateral) (L>R) 09/08/2016   Intermittent left thoracic Muscle cramps 09/08/2016   Spasm of thoracolumbar muscle (Left) 09/08/2016   Post-traumatic osteoarthritis of knee (Left) 09/08/2016   Lumbar spondylosis 07/05/2016   Chronic pain syndrome 06/28/2016   Elevated sedimentation rate 06/28/2016   Elevated C-reactive protein (CRP) 06/28/2016   Neurogenic pain 06/28/2016   Vitamin D  deficiency 06/28/2016   Long term (current) use of opiate analgesic 04/29/2016   Long term prescription opiate use 04/29/2016   Opiate use 04/29/2016   Chronic low back pain (1ry area of Pain) (Bilateral) (L>R) w/o sciatica 04/29/2016   Chronic knee pain (3ry area of Pain) (Bilateral) (L>R) 04/29/2016   Chronic neck pain (Bilateral) (R>L) 04/29/2016   Chronic upper back pain (midline) 04/29/2016   Chronic foot  pain (bottom of feet) (Bilateral) (R>L) 04/29/2016   Peripheral neuropathy, idiopathic (upper and lower extremity) 04/29/2016   Chronic sacroiliac joint pain (Bilateral) (R>L) 04/29/2016   Lumbar facet syndrome (Bilateral) (R>L) 04/29/2016   Thrush, oral 03/08/2016   Depression 03/05/2016   Insomnia, unspecified 07/31/2015   Neuropathy 07/31/2015   Other specified dorsopathies, site unspecified 07/31/2015   Chronic diastolic heart failure (HCC) 07/16/2015   Tobacco use disorder 07/16/2015   Obstructive sleep apnea 07/16/2015   Tachycardia 07/16/2015   Congestive heart failure (HCC) 06/30/2015   COPD, mild (HCC)    Chest pain 06/14/2015   Hyponatremia 06/14/2015   Swelling    Cough    Rosacea 09/01/2011   PCP:  Tobie Domino, MD Pharmacy:   [facility] [phone] [address], [place] - [address] AT [facility]  OF [address] [place] [address] [place] [address] Phone: [phone] Fax: [phone]     Social Drivers of Health (SDOH) Social History: SDOH Screenings   Food Insecurity: No Food Insecurity (05/31/2024)  Housing: High Risk (05/31/2024)  Transportation Needs: No Transportation Needs (06/01/2024)  Utilities: At Risk (05/31/2024)  Depression (PHQ2-9): Medium Risk (02/17/2023)  Financial Resource Strain: High Risk (07/18/2017)  Physical Activity: Insufficiently Active (07/18/2017)  Social Connections: Unknown (05/31/2024)  Stress: Stress Concern Present (07/18/2017)  Tobacco Use: High Risk (05/31/2024)   SDOH Interventions:     Readmission Risk Interventions     No data to display

## 2024-06-04 ENCOUNTER — Encounter: Payer: Self-pay | Admitting: Hematology

## 2024-06-04 ENCOUNTER — Telehealth (HOSPITAL_COMMUNITY): Payer: Self-pay

## 2024-06-04 ENCOUNTER — Other Ambulatory Visit (HOSPITAL_COMMUNITY): Payer: Self-pay

## 2024-06-04 ENCOUNTER — Other Ambulatory Visit: Payer: Self-pay

## 2024-06-04 DIAGNOSIS — G9341 Metabolic encephalopathy: Secondary | ICD-10-CM | POA: Diagnosis not present

## 2024-06-04 LAB — COMPREHENSIVE METABOLIC PANEL WITH GFR
ALT: 28 U/L (ref 0–44)
AST: 24 U/L (ref 15–41)
Albumin: 2.6 g/dL — ABNORMAL LOW (ref 3.5–5.0)
Alkaline Phosphatase: 105 U/L (ref 38–126)
Anion gap: 10 (ref 5–15)
BUN: 20 mg/dL (ref 6–20)
CO2: 30 mmol/L (ref 22–32)
Calcium: 8.1 mg/dL — ABNORMAL LOW (ref 8.9–10.3)
Chloride: 99 mmol/L (ref 98–111)
Creatinine, Ser: 0.57 mg/dL (ref 0.44–1.00)
GFR, Estimated: >60 mL/min (ref >60)
Glucose, Bld: 144 mg/dL — ABNORMAL HIGH (ref 70–99)
Potassium: 3 mmol/L — ABNORMAL LOW (ref 3.5–5.1)
Sodium: 138 mmol/L (ref 135–145)
Total Bilirubin: 0.4 mg/dL (ref 0.0–1.2)
Total Protein: 5.6 g/dL — ABNORMAL LOW (ref 6.5–8.1)

## 2024-06-04 LAB — CBC WITH DIFFERENTIAL/PLATELET
Abs Immature Granulocytes: 0.16 K/uL — ABNORMAL HIGH (ref 0.00–0.07)
Basophils Absolute: 0 K/uL (ref 0.0–0.1)
Basophils Relative: 0 %
Eosinophils Absolute: 0 K/uL (ref 0.0–0.5)
Eosinophils Relative: 0 %
HCT: 35.1 % — ABNORMAL LOW (ref 36.0–46.0)
Hemoglobin: 12.1 g/dL (ref 12.0–15.0)
Immature Granulocytes: 2 %
Lymphocytes Relative: 8 %
Lymphs Abs: 0.8 K/uL (ref 0.7–4.0)
MCH: 29.4 pg (ref 26.0–34.0)
MCHC: 34.5 g/dL (ref 30.0–36.0)
MCV: 85.4 fL (ref 80.0–100.0)
Monocytes Absolute: 0.4 K/uL (ref 0.1–1.0)
Monocytes Relative: 4 %
Neutro Abs: 8.2 K/uL — ABNORMAL HIGH (ref 1.7–7.7)
Neutrophils Relative %: 86 %
Platelets: 330 K/uL (ref 150–400)
RBC: 4.11 MIL/uL (ref 3.87–5.11)
RDW: 18.6 % — ABNORMAL HIGH (ref 11.5–15.5)
WBC: 9.5 K/uL (ref 4.0–10.5)
nRBC: 0 % (ref 0.0–0.2)

## 2024-06-04 LAB — PHOSPHORUS: Phosphorus: 1.8 mg/dL — ABNORMAL LOW (ref 2.5–4.6)

## 2024-06-04 LAB — MAGNESIUM: Magnesium: 1.7 mg/dL (ref 1.7–2.4)

## 2024-06-04 MED ORDER — DOXYCYCLINE HYCLATE 100 MG PO TABS
100.0000 mg | ORAL_TABLET | Freq: Two times a day (BID) | ORAL | 0 refills | Status: AC
  Filled 2024-06-04: qty 6, 3d supply, fill #0

## 2024-06-04 MED ORDER — POTASSIUM CHLORIDE CRYS ER 20 MEQ PO TBCR
40.0000 meq | EXTENDED_RELEASE_TABLET | Freq: Once | ORAL | Status: AC
  Administered 2024-06-04: 40 meq via ORAL
  Filled 2024-06-04: qty 2

## 2024-06-04 MED ORDER — LEVOFLOXACIN 750 MG PO TABS
750.0000 mg | ORAL_TABLET | Freq: Every day | ORAL | 0 refills | Status: AC
  Filled 2024-06-04: qty 3, 3d supply, fill #0

## 2024-06-04 MED ORDER — PREGABALIN 150 MG PO CAPS: 150.0000 mg | ORAL_CAPSULE | Freq: Two times a day (BID) | ORAL | Status: DC

## 2024-06-04 MED ORDER — POTASSIUM PHOSPHATES 15 MMOLE/5ML IV SOLN
30.0000 mmol | Freq: Once | INTRAVENOUS | Status: AC
  Administered 2024-06-04: 30 mmol via INTRAVENOUS
  Filled 2024-06-04: qty 10

## 2024-06-04 MED ORDER — PREDNISONE 20 MG PO TABS
ORAL_TABLET | ORAL | 0 refills | Status: AC
  Filled 2024-06-04: qty 7, 6d supply, fill #0

## 2024-06-04 MED ORDER — OXYCODONE HCL 10 MG PO TABS
10.0000 mg | ORAL_TABLET | Freq: Three times a day (TID) | ORAL | 0 refills | Status: AC | PRN
  Filled 2024-06-04: qty 10, 4d supply, fill #0

## 2024-06-04 NOTE — Progress Notes (Signed)
 Deaccessed pt's port per MD order. IV removed. DC packet and instruction given to pt, pt is now ready to be wheeled out by NT. All the belongings returned. Daughter by bed side.

## 2024-06-04 NOTE — Discharge Summary (Addendum)
 DISCHARGE SUMMARY    Erica [name] FMW:[id number] DOB: 10-10-1965 DOA: 05/31/2024  PCP: Tobie Domino, MD  Admit date: 05/31/2024 Discharge date: 06/04/2024   Recommendations for Outpatient Follow-up:  Follow Up with primary care physician to review chronic medication management   Hospital Course: Erica [name] is a 58 y.o. female with medical history significant of stage IV rectal cancer, CHF, COPD, diabetes, hyperlipidemia, hypertension, anxiety, arthritis, tobacco abuse, opioid use, polypharmacy. Patient presents to the ED after wellness check was called and EMS found patient unresponsive with agonal respirations.  She was given naloxone  in EMS with rapid improvement.  On arrival to the ED she was given a repeat dose with improvement.  Patient reports that she has been taking her 50 mg oxycodone  3 times daily, has also had some alcohol.  She cannot quantify how much. Vitals on arrival are tachycardic 113, tachypneic 22, initial BP 126/67. Labs reveal hyponatremia with sodium of 131, creatinine 1.41, anion gap acidosis with lactic acid 2.0, bicarb 17, tox screen negative.  Head CT without acute abnormality.  CXR with right airspace opacity suspicious for aspiration pneumonia. TRH was called for admission.  She was started on ceftriaxone, azithromycin , Flagyl . Lactic acid continued to rise overnight and blood pressure was persistently low.  MRSA swab resulted positive.  Vancomycin  added 1/14. Gradually patient's breathing improved and her mental status returned to normal.  By 11/17 she endorsed feeling ready to discharge home. We had extensive discussion, with her friend Izetta at bedside per her request, regarding her polypharmacy.  We discussed how serious mixing opioids, benzodiazepines, alcohol is.  We discussed the importance of deprescribing these medications and following closely with an outpatient physician to continue chronic medication management and pain management.  She endorses  understanding.   Acute metabolic encephalopathy Unintentional drug overdose suspected - Likely secondary to polypharmacy, high opioid burden, + benzos, and alcohol mix - Resolved to her baseline now - Head CT on arrival without acute intracranial abnormality. - Tylenol , salicylate, EtOH all WNL. - Ammonia WNL - TSH very low, T4 WNL.  Will hold off on treatment in this acute setting for now.  Will need close outpatient follow-up with PCP to discuss repeat TSH - High-sensitivity troponin 71 with downtrend on repeat - Blood cultures remain negative - UDS negative (obtained over 24 hours after admission) - UA not consistent with infection - Patient was responsive to Narcan  which was administered both with EMS and ED - PT/OT recommending SNF.  Patient refusing.  Prefers home health.  Health agencies were consulted but refusing to provide care per TOC.  Patient has requested to discharge home without this being arranged.  Hypokalemia - Takes daily potassium replacement at home.   Hypotension - Patient is persistently hypotensive with lactic acidosis suspect which I suspect was related to opioid overdose.  Does not have fever, leukocytosis, or tachycardia.  Does not meet sepsis criteria at this time - Status post fluid bolus and maintenance fluids. - Was on midodrine but blood pressure is now elevated and midodrine has been discontinued.  Polypharmacy - Patient has chronic pain secondary to rectal cancer.  She has had multiple prior admissions for altered mental status.  We will need to consider discontinuation or at least significantly reducing her oxycodone .  She is also taking clonazepam , zolpidem Lyrica , and alcohol. - I discussed this with her directly and she is amendable to cutting back on some meds. - Have decreased her oxycodone  to 10 mg at discharge, I did advise her  initially to take half tab of Oxy 15 mg at home but she reports that her camper door is broken since EMS had to break  the door down and all medications have since been taken.   Chronic pain - Patient is currently receiving high-dose opioid therapy from oncology NP for neoplasm related pain.  On review of oncology notes malignancy was adequately treated without obvious residual on CT as recent as May 2025.  When discussing this with the patient she reports that she has multiple other areas of chronic pain - States she is more alert and now reporting severe pain.  Will allow oxy at 10 mg dose, advised patient that this will be the max at home as well until we can taper off slowly.   Pneumonia - CXR with right sided infiltrate.  Diffuse rhonchi and wheezing on exam - Been on ceftriaxone/azithromycin /Flagyl /Vanco outpatient.  PCN allergy - Continue with 3 additional days of Levaquin  and doxycycline  to complete antibiotic course for aspiration pneumonia with possible MRSA pneumonia - Flu/COVID/RSV negative - Remains afebrile.  Leukocytosis unreliable secondary to steroid therapy   AKI - Baseline creatinine 0.74, 1.41 on arrival -Has resolved to baseline now.  IV fluids discontinued.  Encourage p.o. intake   Lactic acidosis -Blood cultures without growth x3 days follow-up to completion - Lactic acidosis may have been related to pneumonia, perhaps complicated by alcohol use with starvation ketoacidosis - Antibiotic therapy as above.   COPD Chronic respiratory failure - Chronically on 2 L O2 - Does appear to have a new pneumonia and diffuse wheezing on exam - Continue steroid therapy - Continue scheduled DuoNebs - Continue home inhalers.  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 - Completed 8 cycles of chemotherapy in March 2025 with good response including significant reduction in lung mass and no obvious residual malignancy on abdominal CT. -  Has been seeing palliative for cancer related pain management - On chart review it does  not appear that she has clear goals of care plan.  Treatment appears to have been impacted by housing instability and financial difficulties - Will ensure close outpatient follow-up with oncologist   Depression Anxiety - Resume home dose duloxetine  - Try to avoid sedating medications, will allow for very low-dose Ativan  to avoid benzo withdrawal   Alcohol abuse - Patient drinks vodka daily, she does endorse drinking today.  EtOH undetectable on arrival - No evidence of withdrawal while admitted. - Reiterated the importance of discontinuation  HF Preserved EF, grade 1 diastolic dysfunction - Most recent echocardiogram May 2025: LVEF 60 to 65%, diastolic parameters indeterminate - Follows outpatient with Dr. Gollan -On diltiazem  at home for palpitations.  Resume    Diabetes - Hgb A1c 5.2 - Some hyperglycemia this admission secondary to steroid use.    Hyperlipidemia - Continue statin   Tobacco abuse - Continue nicotine  patch.  Has been counseled on cessation   Discharge Instructions  Discharge Instructions     Call MD for:  difficulty breathing, headache or visual disturbances   Complete by: As directed    Call MD for:  persistant dizziness or light-headedness   Complete by: As directed    Call MD for:  persistant nausea and vomiting   Complete by: As directed    Call MD for:  severe uncontrolled pain   Complete by: As directed    Call MD for:  temperature >100.4   Complete by: As  directed    Diet general   Complete by: As directed    Discharge instructions   Complete by: As directed    We have discussed we have made multiple medication changes during this admission.  It is not safe to continue taking opioid medications, benzodiazepine medications, and drinking alcohol.  I have decreased your oxycodone  from 15 mg to 10 mg.  Eventually we need to work towards tapering off of this medication. I have decreased Lyrica  from 3 times daily to 2 times daily I recommend  discontinuing zolpidem to sleep at night. Do not drink alcohol.  Please follow-up closely with your primary care physician to review your chronic conditions and manage your multiple medications.   Increase activity slowly   Complete by: As directed       Allergies as of 06/04/2024       Reactions   Tramadol Palpitations, Other (See Comments)   Heart Skipping Beats   Penicillins Rash        Medication List     STOP taking these medications    magic mouthwash (nystatin , lidocaine , diphenhydrAMINE, alum & mag hydroxide) suspension   zolpidem 10 MG tablet Commonly known as: AMBIEN       TAKE these medications    Accu-Chek FastClix Lancets Misc Apply topically 2 (two) times daily.   Accu-Chek Guide Test test strip Generic drug: glucose blood 2 (two) times daily.   albuterol  108 (90 Base) MCG/ACT inhaler Commonly known as: VENTOLIN  HFA Inhale 2 puffs into the lungs every 6 (six) hours as needed for wheezing or shortness of breath.   busPIRone  30 MG tablet Commonly known as: BUSPAR  Take 30 mg by mouth 2 (two) times daily. PER PT STATES HER DOSE WAS INCREASED   clonazePAM  0.5 MG tablet Commonly known as: KLONOPIN  Take 0.5 mg by mouth 2 (two) times daily.   cyclobenzaprine  10 MG tablet Commonly known as: FLEXERIL  Take 1 tablet (10 mg total) by mouth 3 (three) times daily as needed for muscle spasms.   diltiazem  120 MG 24 hr capsule Commonly known as: CARDIZEM  CD TAKE ONE CAPSULE BY MOUTH EVERY DAY   diltiazem  30 MG tablet Commonly known as: CARDIZEM  TAKE 1 TABLET(30 MG) BY MOUTH THREE TIMES DAILY AS NEEDED   doxycycline  100 MG tablet Commonly known as: VIBRA -TABS Take 1 tablet (100 mg total) by mouth 2 (two) times daily for 3 days.   DULoxetine  60 MG capsule Commonly known as: CYMBALTA  Take 60 mg by mouth 2 (two) times daily.   Fluticasone -Salmeterol 250-50 MCG/DOSE Aepb Commonly known as: ADVAIR Inhale 1 puff into the lungs 2 (two) times daily.    furosemide  40 MG tablet Commonly known as: LASIX  TAKE 1 TABLET BY MOUTH EVERY DAY. MAY TAKE ADDITIONAL TABLET IN THE AM FOR WEIGHT GAIN AS NEEDED   Jardiance 25 MG Tabs tablet Generic drug: empagliflozin Take 25 mg by mouth daily.   levofloxacin  750 MG tablet Commonly known as: Levaquin  Take 1 tablet (750 mg total) by mouth daily for 3 days.   metFORMIN  500 MG 24 hr tablet Commonly known as: GLUCOPHAGE -XR Take 1,000 mg by mouth 2 (two) times daily.   Oxycodone  HCl 10 MG Tabs Take 1 tablet (10 mg total) by mouth every 8 (eight) hours as needed for up to 3 days for severe pain (pain score 7-10). What changed:  medication strength how much to take when to take this reasons to take this   OXYGEN Inhale 2 L into the lungs at bedtime. Or  as needed throughout the day if short of breath   pantoprazole  40 MG tablet Commonly known as: PROTONIX  Take 1 tablet (40 mg total) by mouth daily.   polyethylene glycol 17 g packet Commonly known as: MIRALAX  / GLYCOLAX  Take 17 g by mouth daily as needed for mild constipation or moderate constipation.   potassium chloride  SA 20 MEQ tablet Commonly known as: KLOR-CON  M Take 1 tablet (20 mEq total) by mouth 2 (two) times daily.   predniSONE  20 MG tablet Commonly known as: DELTASONE  Take 2 tablets (40 mg total) by mouth daily with breakfast for 2 days, THEN 1 tablet (20 mg total) daily with breakfast for 2 days, THEN 0.5 tablets (10 mg total) daily with breakfast for 2 days. Start taking on: June 04, 2024   pregabalin  150 MG capsule Commonly known as: LYRICA  Take 1 capsule (150 mg total) by mouth 2 (two) times daily. What changed: when to take this   senna-docusate 8.6-50 MG tablet Commonly known as: Senokot-S Take 1 tablet by mouth 2 (two) times daily. For constipation   spironolactone  25 MG tablet Commonly known as: ALDACTONE  TAKE 1 TABLET(25 MG) BY MOUTH EVERY MORNING        Contact information for after-discharge care      Home Medical Care     [facility]) .   Service: Home Health Services Contact information: [address] [place]  [address] [phone]                    Allergies  Allergen Reactions   Tramadol Palpitations and Other (See Comments)    Heart Skipping Beats   Penicillins Rash    Consultations:    Procedures/Studies: CT Head Wo Contrast Result Date: 05/31/2024 EXAM: CT HEAD WITHOUT CONTRAST 05/31/2024 03:18:19 PM TECHNIQUE: CT of the head was performed without the administration of intravenous contrast. Automated exposure control, iterative reconstruction, and/or weight based adjustment of the mA/kV was utilized to reduce the radiation dose to as low as reasonably achievable. COMPARISON: Head CT and MRI 05/26/2024. CLINICAL HISTORY: Facial trauma, blunt. FINDINGS: BRAIN AND VENTRICLES: There is no evidence of an acute infarct, intracranial hemorrhage, mass, midline shift, hydrocephalus, or extra-axial fluid collection. Patchy cerebral white matter hypodensities are similar to the prior CT and nonspecific but compatible with mild chronic small vessel ischemic disease. Calcified atherosclerosis at the skull base. ORBITS: No acute abnormality. SINUSES: Mild mucosal thickening in the ethmoid sinuses. Mastoid air cells clear. SOFT TISSUES AND SKULL: No acute soft tissue abnormality. No skull fracture. IMPRESSION: 1. No acute intracranial abnormality. 2. Mild chronic small vessel ischemic disease. Electronically signed by: Dasie Hamburg MD 05/31/2024 03:35 PM EST RP Workstation: [id number]   DG Chest Port 1 View Result Date: 05/31/2024 EXAM: 1 VIEW(S) XRAY OF THE CHEST 05/31/2024 12:49:00 PM COMPARISON: 05/26/2024 CLINICAL HISTORY: Unresponsive episode FINDINGS: LINES, TUBES AND DEVICES: Port-a-cath tip at low SVC. LUNGS AND PLEURA: Suspect mild / patchy right mid and lower lung airspace disease. No pleural effusion. No  pneumothorax. HEART AND MEDIASTINUM: The left heart border is obscured. BONES AND SOFT TISSUES: Patient rotated right. No acute osseous abnormality. IMPRESSION: 1. Oblique portable radiograph. 2. patchy right infrahilar and lower lobe airspace opacities, suspicious for pneumonia 3. Obscuration of the left heart border, most likely due to obliquity; cannot exclude left perihilar airspace disease 4. Consider PA and lateral radiographs if feasible. Electronically signed by: Rockey Kilts MD 05/31/2024 01:34 PM EST RP Workstation: [id number]  MR BRAIN WO CONTRAST Result Date: 05/26/2024 EXAM: MRI BRAIN WITHOUT CONTRAST 05/26/2024 05:37:00 PM TECHNIQUE: Multiplanar multisequence MRI of the head/brain was performed without the administration of intravenous contrast. COMPARISON: CT head from earlier today. CLINICAL HISTORY: Neuro deficit, acute, stroke suspected FINDINGS: BRAIN AND VENTRICLES: No acute infarct. No intracranial hemorrhage. No mass. No midline shift. No hydrocephalus. Mild-to-moderate T2 hyperintensities in the white matter which are nonspecific but compatible with chronic microvascular ischemic change. Normal flow voids. ORBITS: No acute abnormality. SINUSES AND MASTOIDS: No acute abnormality. BONES AND SOFT TISSUES: Normal marrow signal. No acute soft tissue abnormality. IMPRESSION: 1. No acute intracranial abnormality. 2. Mild-to-moderate coronary microvascular ischemic change. Electronically signed by: Gilmore Molt MD 05/26/2024 05:55 PM EST RP Workstation: [id number]   DG Chest Port 1 View Result Date: 05/26/2024 EXAM: 1 VIEW(S) XRAY OF THE CHEST 05/26/2024 01:33:00 PM COMPARISON: 02/29/2024 CLINICAL HISTORY: weakness FINDINGS: LINES, TUBES AND DEVICES: Right IJ port catheter in place with tip in mid SVC. LUNGS AND PLEURA: Diffuse interstitial coarsening, likely related to smoking. No focal pulmonary opacity. No pulmonary edema. No pleural effusion. No pneumothorax. HEART AND MEDIASTINUM: Aortic  arch calcification. No acute abnormality of the cardiac and mediastinal silhouettes. BONES AND SOFT TISSUES: Heterogeneous sclerosis involving the distal right clavicle with presumed surrounding heterotopic ossification. IMPRESSION: 1. Diffuse interstitial coarsening, likely related to smoking/ chronic bronchitis. 2. No superimposed acute process. Electronically signed by: Rockey Kilts MD 05/26/2024 02:02 PM EST RP Workstation: [id number]   CT Head Wo Contrast Result Date: 05/26/2024 EXAM: CT HEAD WITHOUT CONTRAST 05/26/2024 01:38:34 PM TECHNIQUE: CT of the head was performed without the administration of intravenous contrast. Automated exposure control, iterative reconstruction, and/or weight based adjustment of the mA/kV was utilized to reduce the radiation dose to as low as reasonably achievable. COMPARISON: 05/16/2024 CLINICAL HISTORY: Mental status change, unknown cause. FINDINGS: BRAIN AND VENTRICLES: No acute hemorrhage. No evidence of acute infarct. No hydrocephalus. No extra-axial collection. No mass effect or midline shift. ORBITS: No acute abnormality. SINUSES: Mucosal thickening in bilateral maxillary sinuses. SOFT TISSUES AND SKULL: No acute soft tissue abnormality. No skull fracture. IMPRESSION: 1. No acute intracranial abnormality. Electronically signed by: Donnice Mania MD 05/26/2024 01:47 PM EST RP Workstation: [id number]   CT Cervical Spine Wo Contrast Result Date: 05/16/2024 EXAM: CT CERVICAL SPINE WITHOUT CONTRAST 05/16/2024 01:20:34 AM TECHNIQUE: CT of the cervical spine was performed without the administration of intravenous contrast. Multiplanar reformatted images are provided for review. Automated exposure control, iterative reconstruction, and/or weight based adjustment of the mA/kV was utilized to reduce the radiation dose to as low as reasonably achievable. COMPARISON: None available. CLINICAL HISTORY: Neck trauma, intoxicated or obtunded (Age >= 16y). Pt with a fall down some steps  from her camper. Tripped over the dog while walking outside. ETOH with Vodka loaded. She was found outside and had been on the ground for about an hour. Hx: Chronic pain. FINDINGS: CERVICAL SPINE: BONES AND ALIGNMENT: No acute fracture or traumatic malalignment. 2-mm anterolisthesis C3-C4 and C4-C5 is present likely degenerative in nature. DEGENERATIVE CHANGES: Disc space narrowing and endplate remodeling is seen throughout the cervical spine, most severe at C5-C7 in keeping with changes of mild-to-moderate degenerative disc disease. Posteriorly oriented disc osteophytes are seen at C5-C6. Disc osteophytes at C5-C6 result in mild central canal stenosis with an AP diameter of the spinal canal of approximately 8 mm and abutment of the thecal sac. Uncovertebral arthrosis results in moderate neural foraminal narrowing on the left at C4-C5 and on the right  at C5-C6. SOFT TISSUES: No prevertebral soft tissue swelling. IMPRESSION: 1. No acute fracture or listhesis. 2. Mild-to-moderate degenerative disc disease with disc space narrowing and endplate remodeling, most severe at C5C7. 3. 2-mm anterolisthesis at C34 and C45, likely degenerative. 4. Posterior disc osteophytes at C56 cause mild central canal stenosis (AP canal diameter ~8 mm) with thecal sac abutment. 5. Uncovertebral arthrosis with moderate neural foraminal narrowing: left at C45 and right at C56. Electronically signed by: Dorethia Molt MD 05/16/2024 01:39 AM EDT RP Workstation: [id number]   DG Lumbar Spine Complete Result Date: 05/16/2024 EXAM: 4 VIEW(S) XRAY OF THE LUMBAR SPINE 05/16/2024 01:29:00 AM COMPARISON: None available. CLINICAL HISTORY: Fall, intoxicated. BIBA due to fall down some steps from her camper. Tripped over the dog while walking outside. ETOH with Vodka loaded. She was found outside and had been on the ground for about an hour. Hx: Chronic pain. Current pain is back and hip. FINDINGS: LUMBAR SPINE: BONES: Straightening of the  lumbar spine. No acute fracture. No listhesis. No aggressive appearing osseous lesion. Vertebral body height is preserved. DISCS AND DEGENERATIVE CHANGES: Intervertebral disc height loss and endplate abnormality is seen throughout the lumbar spine in keeping with changes of diffuse moderate to severe degenerative disc disease. SOFT TISSUES: Vascular calcifications noted. IMPRESSION: 1. No acute fracture or listhesis. 2. Diffuse moderate-to-severe degenerative disc disease throughout the lumbar spine. Electronically signed by: Dorethia Molt MD 05/16/2024 01:35 AM EDT RP Workstation: [id number]   DG Hip Unilat W or Wo Pelvis 2-3 Views Left Result Date: 05/16/2024 EXAM: 2 or 3 VIEW(S) XRAY OF THE LEFT HIP 05/16/2024 01:29:00 AM COMPARISON: None available. CLINICAL HISTORY: Fall, intoxicated. BIBA due to fall down some steps from her camper. Tripped over the dog while walking outside. ETOH with Vodka loaded. She was found outside and had been on the ground for about an hour. Hx: Chronic pain. Current pain is back and hip. FINDINGS: BONES AND JOINTS: Normal alignment. No acute fracture or dislocation. Mild asymmetric right hip degenerative arthritis. Vascular calcifications noted. SOFT TISSUES: The soft tissues are unremarkable. IMPRESSION: 1. No acute fracture or dislocation. Electronically signed by: Dorethia Molt MD 05/16/2024 01:33 AM EDT RP Workstation: [id number]   CT HEAD WO CONTRAST ( ) Result Date: 05/16/2024 EXAM: CT HEAD WITHOUT CONTRAST 05/16/2024 01:20:34 AM TECHNIQUE: CT of the head was performed without the administration of intravenous contrast. Automated exposure control, iterative reconstruction, and/or weight based adjustment of the mA/kV was utilized to reduce the radiation dose to as low as reasonably achievable. COMPARISON: None available. CLINICAL HISTORY: Head trauma, moderate-severe; Fall, possible head injury, possible loss of consciousness, intoxicated. Pt with a fall down some steps  from her camper. Tripped over the dog while walking outside. ETOH with Vodka loaded. She was found outside and had been on the ground for about an hour.; Hx: Chronic pain. FINDINGS: BRAIN AND VENTRICLES: No acute hemorrhage. No evidence of acute infarct. No hydrocephalus. No extra-axial collection. No mass effect or midline shift. ORBITS: No acute abnormality. SINUSES: No acute abnormality. SOFT TISSUES AND SKULL: No acute soft tissue abnormality. No skull fracture. IMPRESSION: 1. No acute intracranial abnormality. Electronically signed by: Dorethia Molt MD 05/16/2024 01:32 AM EDT RP Workstation: [id number]      Discharge Exam: Vitals:   06/04/24 0510 06/04/24 0802  BP: (!) 147/83 131/77  Pulse: 75 92  Resp: 18 16  Temp: 97.8 F (36.6 C) 97.9 F (36.6 C)  SpO2: 100% (!) 89%   Vitals:   06/03/24 1451 06/03/24 2035  06/04/24 0510 06/04/24 0802  BP: (!) 152/93 (!) 150/96 (!) 147/83 131/77  Pulse: 93 92 75 92  Resp: 17 16 18 16   Temp: 97.7 F (36.5 C) 98 F (36.7 C) 97.8 F (36.6 C) 97.9 F (36.6 C)  TempSrc:      SpO2: 96% 100% 100% (!) 89%  Weight:      Height:        Constitutional:  Normal appearance. Non toxic-appearing.  HENT: Head Normocephalic and atraumatic.  Mucous membranes are moist.  Eyes:  Extraocular intact. Conjunctivae normal.  Cardiovascular: Rate and Rhythm: Normal rate and regular rhythm.  Pulmonary: Non labored, symmetric rise of chest wall.  Skin: warm and dry. not jaundiced.  Neurological: No focal deficit present. alert. Oriented.  Psychiatric: Mood and Affect congruent.    The results of significant diagnostics from this hospitalization (including imaging, microbiology, ancillary and laboratory) are listed below for reference.     Microbiology: Recent Results (from the past 240 hours)  Blood Culture (routine x 2)     Status: None (Preliminary result)   Collection Time: 05/31/24  1:27 PM   Specimen: BLOOD  Result Value Ref Range Status   Specimen  Description BLOOD BLOOD RIGHT HAND  Final   Special Requests   Final    BOTTLES DRAWN AEROBIC AND ANAEROBIC Blood Culture results may not be optimal due to an inadequate volume of blood received in culture bottles   Culture   Final    NO GROWTH 4 DAYS Performed at [facility], [address] [place], [place] [address]    Report Status PENDING  Incomplete  Blood Culture (routine x 2)     Status: None (Preliminary result)   Collection Time: 05/31/24  1:27 PM   Specimen: BLOOD  Result Value Ref Range Status   Specimen Description BLOOD BLOOD RIGHT ARM  Final   Special Requests   Final    BOTTLES DRAWN AEROBIC AND ANAEROBIC Blood Culture results may not be optimal due to an inadequate volume of blood received in culture bottles   Culture   Final    NO GROWTH 4 DAYS Performed at [facility], [address], [place], [place] [address]    Report Status PENDING  Incomplete  Resp panel by RT-PCR (RSV, Flu A&B, Covid) Anterior Nasal Swab     Status: None   Collection Time: 05/31/24  5:19 PM   Specimen: Anterior Nasal Swab  Result Value Ref Range Status   SARS Coronavirus 2 by RT PCR NEGATIVE NEGATIVE Final    Comment: (NOTE) SARS-CoV-2 target nucleic acids are NOT DETECTED.  The SARS-CoV-2 RNA is generally detectable in upper respiratory specimens during the acute phase of infection. The lowest concentration of SARS-CoV-2 viral copies this assay can detect is 138 copies/mL. A negative result does not preclude SARS-Cov-2 infection and should not be used as the sole basis for treatment or other patient management decisions. A negative result may occur with  improper specimen collection/handling, submission of specimen other than nasopharyngeal swab, presence of viral mutation(s) within the areas targeted by this assay, and inadequate number of viral copies(<138 copies/mL). A negative result must be combined with clinical observations, patient history, and  epidemiological information. The expected result is Negative.  Fact Sheet for Patients:  bloggercourse.com  Fact Sheet for Healthcare Providers:  seriousbroker.it  This test is no t yet approved or cleared by the [place]  FDA and  has been authorized for detection and/or diagnosis of SARS-CoV-2 by FDA  under an Emergency Use Authorization (EUA). This EUA will remain  in effect (meaning this test can be used) for the duration of the COVID-19 declaration under Section 564(b)(1) of the Act, 21 U.S.C.section 360bbb-3(b)(1), unless the authorization is terminated  or revoked sooner.       Influenza A by PCR NEGATIVE NEGATIVE Final   Influenza B by PCR NEGATIVE NEGATIVE Final    Comment: (NOTE) The Xpert Xpress SARS-CoV-2/FLU/RSV plus assay is intended as an aid in the diagnosis of influenza from Nasopharyngeal swab specimens and should not be used as a sole basis for treatment. Nasal washings and aspirates are unacceptable for Xpert Xpress SARS-CoV-2/FLU/RSV testing.  Fact Sheet for Patients: bloggercourse.com  Fact Sheet for Healthcare Providers: seriousbroker.it  This test is not yet approved or cleared by the [place]  FDA and has been authorized for detection and/or diagnosis of SARS-CoV-2 by FDA under an Emergency Use Authorization (EUA). This EUA will remain in effect (meaning this test can be used) for the duration of the COVID-19 declaration under Section 564(b)(1) of the Act, 21 U.S.C. section 360bbb-3(b)(1), unless the authorization is terminated or revoked.     Resp Syncytial Virus by PCR NEGATIVE NEGATIVE Final    Comment: (NOTE) Fact Sheet for Patients: bloggercourse.com  Fact Sheet for Healthcare Providers: seriousbroker.it  This test is not yet approved or cleared by the [place]  FDA and has been  authorized for detection and/or diagnosis of SARS-CoV-2 by FDA under an Emergency Use Authorization (EUA). This EUA will remain in effect (meaning this test can be used) for the duration of the COVID-19 declaration under Section 564(b)(1) of the Act, 21 U.S.C. section 360bbb-3(b)(1), unless the authorization is terminated or revoked.  Performed at [facility], [address] [place], [place] [address]   MRSA Next Gen by PCR, Nasal     Status: Abnormal   Collection Time: 05/31/24  5:19 PM   Specimen: Anterior Nasal Swab  Result Value Ref Range Status   MRSA by PCR Next Gen DETECTED (A) NOT DETECTED Final    Comment: RESULT CALLED TO, READ BACK BY AND VERIFIED WITH: VANESSA GRAHAM @2057  ON 05/31/24 SKL (NOTE) The GeneXpert MRSA Assay (FDA approved for NASAL specimens only), is one component of a comprehensive MRSA colonization surveillance program. It is not intended to diagnose MRSA infection nor to guide or monitor treatment for MRSA infections. Test performance is not FDA approved in patients less than 12 years old. Performed at [facility], [address] [place], [place] [address]      Labs: BNP (last 3 results) No results for input(s): BNP in the last 8760 hours. Basic Metabolic Panel: Recent Labs  Lab 05/31/24 1051 06/01/24 0413 06/02/24 0529 06/02/24 0929 06/03/24 0446 06/03/24 1547 06/04/24 0313  NA 131* 135  --  140 140  --  138  K 4.2 3.3*  --  2.3* 2.5* 3.1* 3.0*  CL 97* 101  --  106 100  --  99  CO2 17* 21*  --  22 26  --  30  GLUCOSE 160* 157*  --  163* 108*  --  144*  BUN 28* 23*  --  21* 18  --  20  CREATININE 1.41* 0.92  --  0.66 0.61  --  0.57  CALCIUM  8.6* 8.5*  --  8.3* 8.4*  --  8.1*  MG  --  1.5* 1.8  --  1.7  --  1.7  PHOS  --  3.1 2.4*  --  2.4*  --  1.8*   Liver Function Tests: Recent Labs  Lab 05/31/24 1051 06/01/24 0413 06/02/24 0929 06/03/24 0446 06/04/24 0313  AST 61* 80* 55* 31 24  ALT 33 42 43 37 28   ALKPHOS 91 58 79 252* 105  BILITOT 1.6* 1.0 0.6 0.5 0.4  PROT 6.5 5.6* 5.4* 5.6* 5.6*  ALBUMIN 3.1* 2.5* 2.4* 2.5* 2.6*   No results for input(s): LIPASE, AMYLASE in the last 168 hours. Recent Labs  Lab 05/31/24 1736  AMMONIA <13   CBC: Recent Labs  Lab 05/31/24 1051 06/01/24 0413 06/02/24 0929 06/03/24 0446 06/04/24 0313  WBC 6.7 5.2 13.6* 11.4* 9.5  NEUTROABS  --   --  12.8* 10.2* 8.2*  HGB 10.7* 9.7* 10.9* 11.2* 12.1  HCT 32.4* 28.0* 31.7* 32.6* 35.1*  MCV 89.8 85.9 85.2 85.1 85.4  PLT 158 158 225 259 330   Cardiac Enzymes: Recent Labs  Lab 05/31/24 1327  CKTOTAL 236*   BNP: Invalid input(s): POCBNP CBG: Recent Labs  Lab 06/01/24 2124 06/02/24 0751 06/02/24 1141 06/02/24 1651 06/03/24 0750  GLUCAP 144* 129* 161* 170* 122*   D-Dimer No results for input(s): DDIMER in the last 72 hours. Hgb A1c No results for input(s): HGBA1C in the last 72 hours. Lipid Profile No results for input(s): CHOL, HDL, LDLCALC, TRIG, CHOLHDL, LDLDIRECT in the last 72 hours. Thyroid function studies No results for input(s): TSH, T4TOTAL, T3FREE, THYROIDAB in the last 72 hours.  Invalid input(s): FREET3 Anemia work up No results for input(s): VITAMINB12, FOLATE, FERRITIN, TIBC, IRON, RETICCTPCT in the last 72 hours. Urinalysis    Component Value Date/Time   COLORURINE YELLOW (A) 06/01/2024 1435   APPEARANCEUR CLEAR (A) 06/01/2024 1435   APPEARANCEUR Clear 02/26/2014 2019   LABSPEC 1.022 06/01/2024 1435   LABSPEC 1.014 02/26/2014 2019   PHURINE 5.0 06/01/2024 1435   GLUCOSEU >=500 (A) 06/01/2024 1435   GLUCOSEU Negative 02/26/2014 2019   HGBUR NEGATIVE 06/01/2024 1435   BILIRUBINUR NEGATIVE 06/01/2024 1435   BILIRUBINUR Negative 02/26/2014 2019   KETONESUR NEGATIVE 06/01/2024 1435   PROTEINUR 30 (A) 06/01/2024 1435   NITRITE NEGATIVE 06/01/2024 1435   LEUKOCYTESUR NEGATIVE 06/01/2024 1435   LEUKOCYTESUR Trace 02/26/2014  2019   Sepsis Labs Recent Labs  Lab 06/01/24 0413 06/02/24 0929 06/03/24 0446 06/04/24 0313  WBC 5.2 13.6* 11.4* 9.5   Microbiology Recent Results (from the past 240 hours)  Blood Culture (routine x 2)     Status: None (Preliminary result)   Collection Time: 05/31/24  1:27 PM   Specimen: BLOOD  Result Value Ref Range Status   Specimen Description BLOOD BLOOD RIGHT HAND  Final   Special Requests   Final    BOTTLES DRAWN AEROBIC AND ANAEROBIC Blood Culture results may not be optimal due to an inadequate volume of blood received in culture bottles   Culture   Final    NO GROWTH 4 DAYS Performed at [facility], [address], [place], [place] [address]    Report Status PENDING  Incomplete  Blood Culture (routine x 2)     Status: None (Preliminary result)   Collection Time: 05/31/24  1:27 PM   Specimen: BLOOD  Result Value Ref Range Status   Specimen Description BLOOD BLOOD RIGHT ARM  Final   Special Requests   Final    BOTTLES DRAWN AEROBIC AND ANAEROBIC Blood Culture results may not be optimal due to an inadequate volume of blood received in culture bottles   Culture  Final    NO GROWTH 4 DAYS Performed at [facility], [address] [place], [place] [address]    Report Status PENDING  Incomplete  Resp panel by RT-PCR (RSV, Flu A&B, Covid) Anterior Nasal Swab     Status: None   Collection Time: 05/31/24  5:19 PM   Specimen: Anterior Nasal Swab  Result Value Ref Range Status   SARS Coronavirus 2 by RT PCR NEGATIVE NEGATIVE Final    Comment: (NOTE) SARS-CoV-2 target nucleic acids are NOT DETECTED.  The SARS-CoV-2 RNA is generally detectable in upper respiratory specimens during the acute phase of infection. The lowest concentration of SARS-CoV-2 viral copies this assay can detect is 138 copies/mL. A negative result does not preclude SARS-Cov-2 infection and should not be used as the sole basis for treatment or other patient management decisions. A  negative result may occur with  improper specimen collection/handling, submission of specimen other than nasopharyngeal swab, presence of viral mutation(s) within the areas targeted by this assay, and inadequate number of viral copies(<138 copies/mL). A negative result must be combined with clinical observations, patient history, and epidemiological information. The expected result is Negative.  Fact Sheet for Patients:  bloggercourse.com  Fact Sheet for Healthcare Providers:  seriousbroker.it  This test is no t yet approved or cleared by the [place]  FDA and  has been authorized for detection and/or diagnosis of SARS-CoV-2 by FDA under an Emergency Use Authorization (EUA). This EUA will remain  in effect (meaning this test can be used) for the duration of the COVID-19 declaration under Section 564(b)(1) of the Act, 21 U.S.C.section 360bbb-3(b)(1), unless the authorization is terminated  or revoked sooner.       Influenza A by PCR NEGATIVE NEGATIVE Final   Influenza B by PCR NEGATIVE NEGATIVE Final    Comment: (NOTE) The Xpert Xpress SARS-CoV-2/FLU/RSV plus assay is intended as an aid in the diagnosis of influenza from Nasopharyngeal swab specimens and should not be used as a sole basis for treatment. Nasal washings and aspirates are unacceptable for Xpert Xpress SARS-CoV-2/FLU/RSV testing.  Fact Sheet for Patients: bloggercourse.com  Fact Sheet for Healthcare Providers: seriousbroker.it  This test is not yet approved or cleared by the [place]  FDA and has been authorized for detection and/or diagnosis of SARS-CoV-2 by FDA under an Emergency Use Authorization (EUA). This EUA will remain in effect (meaning this test can be used) for the duration of the COVID-19 declaration under Section 564(b)(1) of the Act, 21 U.S.C. section 360bbb-3(b)(1), unless the authorization  is terminated or revoked.     Resp Syncytial Virus by PCR NEGATIVE NEGATIVE Final    Comment: (NOTE) Fact Sheet for Patients: bloggercourse.com  Fact Sheet for Healthcare Providers: seriousbroker.it  This test is not yet approved or cleared by the [place]  FDA and has been authorized for detection and/or diagnosis of SARS-CoV-2 by FDA under an Emergency Use Authorization (EUA). This EUA will remain in effect (meaning this test can be used) for the duration of the COVID-19 declaration under Section 564(b)(1) of the Act, 21 U.S.C. section 360bbb-3(b)(1), unless the authorization is terminated or revoked.  Performed at [facility], [address] [place], [place] [address]   MRSA Next Gen by PCR, Nasal     Status: Abnormal   Collection Time: 05/31/24  5:19 PM   Specimen: Anterior Nasal Swab  Result Value Ref Range Status   MRSA by PCR Next Gen DETECTED (A) NOT DETECTED Final    Comment: RESULT CALLED TO,  READ BACK BY AND VERIFIED WITH: VANESSA GRAHAM @2057  ON 05/31/24 SKL (NOTE) The GeneXpert MRSA Assay (FDA approved for NASAL specimens only), is one component of a comprehensive MRSA colonization surveillance program. It is not intended to diagnose MRSA infection nor to guide or monitor treatment for MRSA infections. Test performance is not FDA approved in patients less than 62 years old. Performed at [facility], [address], [place], [place] [address]      Time coordinating discharge: 32 min   SIGNED: Lorane Poland, DO [facility] 06/04/2024, 2:10 PM Pager   If 7PM-7AM, please contact night-coverage

## 2024-06-04 NOTE — Progress Notes (Signed)
 Occupational Therapy Treatment Patient Details Name: Erica [name] MRN: [id number] DOB: [birth year] Today's Date: 06/04/2024   History of present illness GIULIANNA [name] is a 58 y.o. female with medical history significant of stage IV rectal cancer, CHF, COPD, diabetes, hyperlipidemia, hypertension, anxiety, arthritis, tobacco abuse, opioid use, polypharmacy. Patient presented to the ED on 06/01/24 after wellness check was called and EMS found patient unresponsive with agonal respirations. She was given naloxone  in EMS with rapid improvement. On arrival to the ED she was given a repeat dose. MD assessment includes: AMS, polypharm, sepsis, and pneunomia.   OT comments  Ms. Sabree was seen for OT treatment on this date. Upon arrival to room pt seated EOB, preparing for anticipated DC later this PM, family supports at bedside, agreeable to OT Tx session. Cognition much improved from initial OT evaluation. OT facilitated ADL management with education and assistance as described below. See ADL section for additional details regarding occupational performance. Pt continues to be functionally limited by generalized weakness, decreased safety awareness, and limited activity tolerance. Pt return verbalizes understanding of education provided t/o session. Pt is progressing toward OT goals and continues to benefit from skilled OT services to maximize return to PLOF and minimize risk of future falls, injury, and readmission. Will continue to follow POC as written. Discharge recommendation updated to in 2/2 pt's improved cognitive and functional status.        If plan is discharge home, recommend the following:  A little help with walking and/or transfers;Assistance with cooking/housework;Assist for transportation;Help with stairs or ramp for entrance   Equipment Recommendations  None recommended by OT    Recommendations for Other Services      Precautions / Restrictions Precautions Precautions:  Fall Recall of Precautions/Restrictions: Intact Restrictions Weight Bearing Restrictions Per Provider Order: No       Mobility Bed Mobility Overal bed mobility: Independent Bed Mobility: Sit to Supine       Sit to supine: Independent   General bed mobility comments: pt did not require any cueing or assistance with bed mobility. Pt was sitting EOB on arrival    Transfers Overall transfer level: Independent Equipment used: None Transfers: Sit to/from Stand Sit to Stand: Supervision                 Balance Overall balance assessment: Needs assistance Sitting-balance support: No upper extremity supported, Feet supported Sitting balance-Leahy Scale: Normal Sitting balance - Comments: steady with dynamic sitting balance during functional activity.   Standing balance support: No upper extremity supported, During functional activity Standing balance-Leahy Scale: Good                             ADL either performed or assessed with clinical judgement   ADL   Eating/Feeding: Sitting;Modified independent   Grooming: Sitting;Brushing hair;Modified independent               Lower Body Dressing: Supervision/safety;Set up;Sit to/from stand Lower Body Dressing Details (indicate cue type and reason): Dons bilat socks, underwear, and pajama bottoms from STS after set up assist from dtr.             Functional mobility during ADLs: Supervision/safety;Set up General ADL Comments: Cognition much improved from previous session. Pt recieved, dressing, preparing for potential DC home this date. Supportive daughter at bedside. Pt, provider, and caregiver provlem solved strategies to maximize safety and functional independence upon hospital DC. Dtr states that she plans to stay  with pt initially upon DC. Discussed strategies for IADL management including pet management strategies. Pt/caregiver return verbalize undestanding. All questions answered at this time.     Extremity/Trunk Assessment              Vision Patient Visual Report: No change from baseline     Perception     Praxis     Communication Communication Communication: No apparent difficulties   Cognition Arousal: Alert Behavior During Therapy: WFL for tasks assessed/performed Cognition: No apparent impairments             OT - Cognition Comments: Cognition much improved from initial OT eval. Pt A&O x3 (did not assess for date) and able to safely follow VCs t/o session.                 Following commands: Intact Following commands impaired: Only follows one step commands consistently      Cueing   Cueing Techniques: Verbal cues  Exercises      Shoulder Instructions       General Comments Pt on RA at start/end of session. VSS, denies adverse s/s or DOE.    Pertinent Vitals/ Pain       Pain Assessment Pain Assessment: No/denies pain  Home Living                                          Prior Functioning/Environment              Frequency  Min 2X/week        Progress Toward Goals  OT Goals(current goals can now be found in the care plan section)  Progress towards OT goals: Progressing toward goals  Acute Rehab OT Goals OT Goal Formulation: With patient Time For Goal Achievement: 06/15/24  Plan      Co-evaluation                 AM-PAC OT 6 Clicks Daily Activity     Outcome Measure   Help from another person eating meals?: None Help from another person taking care of personal grooming?: None Help from another person toileting, which includes using toliet, bedpan, or urinal?: A Little Help from another person bathing (including washing, rinsing, drying)?: A Little Help from another person to put on and taking off regular upper body clothing?: None Help from another person to put on and taking off regular lower body clothing?: A Little 6 Click Score: 21    End of Session Equipment Utilized During  Treatment: Rolling walker (2 wheels);Gait belt  OT Visit Diagnosis: Other symptoms and signs involving cognitive function;Other abnormalities of gait and mobility (R26.89)   Activity Tolerance Patient tolerated treatment well   Patient Left in bed;with call bell/phone within reach;with bed alarm set   Nurse Communication Mobility status        Time: 8598-8584 OT Time Calculation (min): 14 min  Charges: OT General Charges $OT Visit: 1 Visit OT Treatments $Self Care/Home Management : 8-22 mins  Jhonny Pelton, M.S., OTR/L 06/04/24, 3:26 PM

## 2024-06-04 NOTE — Telephone Encounter (Signed)
 Pharmacy Patient Advocate Encounter  Insurance verification completed.    The patient is insured through [facility].     Ran test claim for Advair [id number] and the current 30 day co-pay is $4.  Ran test claim for Breo Ellipta  [id number] and it requires a PA.   This test claim was processed through  [facility]- copay amounts may vary at other pharmacies due to pharmacy/plan contracts, or as the patient moves through the different stages of their insurance plan.

## 2024-06-04 NOTE — Plan of Care (Signed)

## 2024-06-04 NOTE — Plan of Care (Signed)

## 2024-06-04 NOTE — Progress Notes (Signed)
 Physical Therapy Treatment Patient Details Name: Erica [name] MRN: [id number] DOB: 1965-08-18 Today's Date: 06/04/2024   History of Present Illness Erica [name] is a 58 y.o. female with medical history significant of stage IV rectal cancer, CHF, COPD, diabetes, hyperlipidemia, hypertension, anxiety, arthritis, tobacco abuse, opioid use, polypharmacy. Patient presented to the ED on 06/01/24 after wellness check was called and EMS found patient unresponsive with agonal respirations. She was given naloxone  in EMS with rapid improvement. On arrival to the ED she was given a repeat dose. MD assessment includes: AMS, polypharm, sepsis, and pneunomia.    PT Comments  Pt was sitting EOB at the start of the session, AO x4 and reported her pain level as 9/10. Pt was pleasant and motivated to participate during the session and put forth good effort throughout. Pt was able to perform bed mobility, transfers and ambulation with CGA, without any cueing. Pt's SpO2 was monitored throughout the session and ranged from the high 80's to low 90's on RA. Pt tolerated ambulation progressively, without AD, with rest breaks and SpO2 monitoring. Pt's SpO2 remained in the low 90's, though HR went up to the 120's with ambulation. After ambulation pts SpO2 was monitored at 87 (nursing notified). Pt was left on 3L of O2 [place] at the end of the session. Pt will benefit from continued PT services upon discharge to safely address deficits listed in patient problem list for decreased caregiver assistance and eventual return to PLOF.    If plan is discharge home, recommend the following: Assistance with cooking/housework;Assist for transportation;Supervision due to cognitive status   Can travel by private vehicle     Yes  Equipment Recommendations  Rolling walker (2 wheels) (for safety)    Recommendations for Other Services       Precautions / Restrictions Precautions Precautions: Fall Recall of Precautions/Restrictions:  Impaired Restrictions Weight Bearing Restrictions Per Provider Order: No     Mobility  Bed Mobility Overal bed mobility: Independent Bed Mobility: Supine to Sit     Supine to sit: Independent Sit to supine: Independent   General bed mobility comments: pt did not require any cueing or assistance with bed mobility. Pt was sitting EOB on arrival    Transfers Overall transfer level: Independent Equipment used: None Transfers: Sit to/from Stand Sit to Stand: Contact guard assist           General transfer comment: CGA STS from EOB for safety purposes only, though did not require    Ambulation/Gait Ambulation/Gait assistance: Contact guard assist Gait Distance (Feet): 30 Feet Assistive device: None Gait Pattern/deviations: WFL(Within Functional Limits) Gait velocity: decreased     General Gait Details: pt ambulated from bed to hallway approx 30 ft total with rest breaks x3 to check SpO2. Pt did require railing for support during rest breaks.   Stairs             Wheelchair Mobility     Tilt Bed    Modified Rankin (Stroke Patients Only)       Balance Overall balance assessment: Needs assistance Sitting-balance support: No upper extremity supported, Feet supported Sitting balance-Leahy Scale: Normal     Standing balance support: No upper extremity supported, During functional activity Standing balance-Leahy Scale: Good Standing balance comment: did not use RW for balance                            Communication Communication Communication: No apparent difficulties  Cognition Arousal:  Alert Behavior During Therapy: WFL for tasks assessed/performed   PT - Cognitive impairments: No apparent impairments   Orientation impairments: Person, Place, Time, Situation                     Following commands: Intact      Cueing Cueing Techniques: Verbal cues, Tactile cues  Exercises Other Exercises Other Exercises: pt was educated on  the benefits of sitting upright in the chair throughout the day Other Exercises: pt was educated on the benefits of graded activity to monitor SpO2 and RPE    General Comments General comments (skin integrity, edema, etc.): Pt on RA at the start of session and during ambulation. SpO2 ranged from 87 to 91 (nursing notified). HR 88 at rest, 120s with activity. Pt was left on 3L of O2 at the end of the session.      Pertinent Vitals/Pain Pain Assessment Pain Assessment: 0-10 Pain Score: 9  Pain Descriptors / Indicators: Aching, Constant Pain Intervention(s): Monitored during session, Repositioned    Home Living Family/patient expects to be discharged to:: Unsure Living Arrangements: Children                      Prior Function            PT Goals (current goals can now be found in the care plan section) Acute Rehab PT Goals Patient Stated Goal: return home PT Goal Formulation: With patient Time For Goal Achievement: 06/15/24 Potential to Achieve Goals: Good    Frequency    Min 2X/week      PT Plan      Co-evaluation              AM-PAC PT 6 Clicks Mobility   Outcome Measure  Help needed turning from your back to your side while in a flat bed without using bedrails?: None Help needed moving from lying on your back to sitting on the side of a flat bed without using bedrails?: None Help needed moving to and from a bed to a chair (including a wheelchair)?: A Little Help needed standing up from a chair using your arms (e.g., wheelchair or bedside chair)?: A Little Help needed to walk in hospital room?: A Little Help needed climbing 3-5 steps with a railing? : A Little 6 Click Score: 20    End of Session Equipment Utilized During Treatment: Oxygen;Gait belt (pt started session on RA. was put back on 3L O2 [place] at end of session) Activity Tolerance: Patient tolerated treatment well Patient left: in chair;with call bell/phone within reach;with chair alarm  set Nurse Communication: Mobility status PT Visit Diagnosis: Muscle weakness (generalized) (M62.81);Difficulty in walking, not elsewhere classified (R26.2);Pain     Time: 9063-9040 PT Time Calculation (min) (ACUTE ONLY): 23 min  Charges:                            {Nadja Lina, SPT 06/04/24, 12:03 PM

## 2024-06-05 LAB — CULTURE, BLOOD (ROUTINE X 2)
Culture: NO GROWTH
Culture: NO GROWTH

## 2024-06-12 ENCOUNTER — Inpatient Hospital Stay

## 2024-06-19 ENCOUNTER — Telehealth: Payer: Self-pay

## 2024-06-19 NOTE — Telephone Encounter (Signed)
 Pt called to confirm her upcoming appt with palliative. RN explained the port flush appt and palliative appt. And the need for her port to be flushed. Pt reported that she has a port flush appt with [facility] on 12/22 and her ort was used and flushed at time of d/c last week. Pt requested port flush appt at [facility] to be canceled and palliative appt to be virtual d/t transportation needs. Appt changed, pt verbalized understanding. No further needs at this time.

## 2024-06-21 ENCOUNTER — Inpatient Hospital Stay: Attending: Oncology | Admitting: Nurse Practitioner

## 2024-06-21 ENCOUNTER — Encounter: Payer: Self-pay | Admitting: Nurse Practitioner

## 2024-06-21 ENCOUNTER — Other Ambulatory Visit

## 2024-06-21 ENCOUNTER — Inpatient Hospital Stay

## 2024-06-21 DIAGNOSIS — C20 Malignant neoplasm of rectum: Secondary | ICD-10-CM | POA: Diagnosis not present

## 2024-06-21 DIAGNOSIS — Z08 Encounter for follow-up examination after completed treatment for malignant neoplasm: Secondary | ICD-10-CM | POA: Insufficient documentation

## 2024-06-21 DIAGNOSIS — G629 Polyneuropathy, unspecified: Secondary | ICD-10-CM | POA: Diagnosis not present

## 2024-06-21 DIAGNOSIS — C3482 Malignant neoplasm of overlapping sites of left bronchus and lung: Secondary | ICD-10-CM

## 2024-06-21 DIAGNOSIS — G894 Chronic pain syndrome: Secondary | ICD-10-CM | POA: Insufficient documentation

## 2024-06-21 DIAGNOSIS — Z515 Encounter for palliative care: Secondary | ICD-10-CM | POA: Diagnosis not present

## 2024-06-21 DIAGNOSIS — M792 Neuralgia and neuritis, unspecified: Secondary | ICD-10-CM

## 2024-06-21 DIAGNOSIS — Z79899 Other long term (current) drug therapy: Secondary | ICD-10-CM | POA: Insufficient documentation

## 2024-06-21 DIAGNOSIS — F419 Anxiety disorder, unspecified: Secondary | ICD-10-CM | POA: Insufficient documentation

## 2024-06-21 DIAGNOSIS — Z85048 Personal history of other malignant neoplasm of rectum, rectosigmoid junction, and anus: Secondary | ICD-10-CM | POA: Insufficient documentation

## 2024-06-21 DIAGNOSIS — G893 Neoplasm related pain (acute) (chronic): Secondary | ICD-10-CM | POA: Diagnosis not present

## 2024-06-21 DIAGNOSIS — F1190 Opioid use, unspecified, uncomplicated: Secondary | ICD-10-CM | POA: Insufficient documentation

## 2024-06-21 DIAGNOSIS — R530 Neoplastic (malignant) related fatigue: Secondary | ICD-10-CM

## 2024-06-21 DIAGNOSIS — F17210 Nicotine dependence, cigarettes, uncomplicated: Secondary | ICD-10-CM

## 2024-06-21 MED ORDER — PREGABALIN 100 MG PO CAPS: 100.0000 mg | ORAL_CAPSULE | Freq: Two times a day (BID) | ORAL | 0 refills | Status: DC

## 2024-06-21 MED ORDER — NALOXONE HCL 4 MG/0.1ML NA LIQD: 1.0000 | Freq: Once | NASAL | 0 refills | Status: AC

## 2024-06-21 MED ORDER — OXYCODONE HCL 10 MG PO TABS: 15.0000 mg | ORAL_TABLET | Freq: Two times a day (BID) | ORAL | 0 refills | Status: AC | PRN

## 2024-06-21 NOTE — Progress Notes (Signed)
 Palliative Medicine [facility]  [phone] [phone]) [phone]   Name: Erica [name] Date: 06/21/2024 MRN: [id number]  DOB: 1966/06/23  Patient Care Team: Tobie Domino, MD as PCP - General (Family Medicine) Perla, Evalene PARAS, MD as PCP - Cardiology (Cardiology) Donette Ellouise DELENA, FNP as Nurse Practitioner (Family Medicine) Perla Evalene PARAS, MD as Consulting Physician (Cardiology) Linard Alm NOVAK, MD (Inactive) as Consulting Physician (Pulmonary Disease) Cindie Jesusa HERO, RN as Registered Nurse Dannielle Arlean FALCON, RN (Inactive) as Registered Nurse Maurie Rayfield BIRCH, RN as Oncology Nurse Navigator Lanny Callander, MD as Consulting Physician (Oncology) Pickenpack-Cousar, Fannie SAILOR, NP as Nurse Practitioner (Hospice and Palliative Medicine) Dewey Rush, MD as Consulting Physician (Radiation Oncology) Therisa Bi, MD as Consulting Physician (Gastroenterology) Isadora Hose, MD as Consulting Physician (Pulmonary Disease) Babara Call, MD as Consulting Physician (Oncology)   I connected with Erica [name] on 06/21/24 at  2:00 PM EST by telephone and verified that I am speaking with the correct person using two identifiers.   I discussed the limitations, risks, security and privacy concerns of performing an evaluation and management service by telemedicine and the availability of in-person appointments. I also discussed with the patient that there may be a patient responsible charge related to this service. The patient expressed understanding and agreed to proceed.   Other persons participating in the visit and their role in the encounter: N/A   Patient's location: Home   Provider's location: [facility]    INTERVAL HISTORY: Erica [name] is a 58 y.o. 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 Palliative ask to see for symptom management and goals of care.    SOCIAL HISTORY:     reports that she has been smoking cigarettes. She has a 138 pack-year smoking history. She has never used smokeless tobacco. She reports current alcohol use. She reports that she does not use drugs.  ADVANCE DIRECTIVES:  None on file   CODE STATUS: Full code  PAST MEDICAL HISTORY: Past Medical History:  Diagnosis Date   (HFpEF) heart failure with preserved ejection fraction (HCC)    a. 05/2015 Echo: EF 60-65%, no rwma, PASP .   Acute pancreatitis 08/13/2018   Anxiety    Arthritis    Asthma    Bell's palsy    no deficit   Bronchitis    Cancer (HCC) 12/2022   rectal cancer   CHF (congestive heart failure) (HCC)    COPD (chronic obstructive pulmonary disease) (HCC)    Depression    Diabetes mellitus without complication (HCC)    type II 03/2017   Dyspnea    Fatty liver    per patient   Gall stones    per patient   Gastric ulcer    Hyperlipidemia    Hypertension    Lung nodule 12/2022   upper left   OSA (obstructive sleep apnea)    a. did not tolerate CPAP. On oxygen 2L via    Pancreatitis    07/2018   Shoulder injury    6/19   Smoker     ALLERGIES:  is allergic to tramadol and penicillins.  MEDICATIONS:  Current Outpatient Medications  Medication Sig Dispense Refill   Accu-Chek FastClix Lancets MISC Apply topically 2 (two) times daily.     ACCU-CHEK GUIDE TEST test strip 2 (two) times daily.     albuterol  (VENTOLIN  HFA) 108 (90 Base) MCG/ACT inhaler Inhale 2 puffs  into the lungs every 6 (six) hours as needed for wheezing or shortness of breath.      busPIRone  (BUSPAR ) 30 MG tablet Take 30 mg by mouth 2 (two) times daily. PER PT STATES HER DOSE WAS INCREASED     clonazePAM  (KLONOPIN ) 0.5 MG tablet Take 0.5 mg by mouth 2 (two) times daily.     cyclobenzaprine  (FLEXERIL ) 10 MG tablet Take 1 tablet (10 mg total) by mouth 3 (three) times daily as needed for muscle spasms. 90 tablet 2   diltiazem  (CARDIZEM  CD) 120 MG 24 hr capsule TAKE  ONE CAPSULE BY MOUTH EVERY DAY 60 capsule 0   diltiazem  (CARDIZEM ) 30 MG tablet TAKE 1 TABLET(30 MG) BY MOUTH THREE TIMES DAILY AS NEEDED 270 tablet 0   DULoxetine  (CYMBALTA ) 60 MG capsule Take 60 mg by mouth 2 (two) times daily.      Fluticasone -Salmeterol (ADVAIR) 250-50 MCG/DOSE AEPB Inhale 1 puff into the lungs 2 (two) times daily.     furosemide  (LASIX ) 40 MG tablet TAKE 1 TABLET BY MOUTH EVERY DAY. MAY TAKE ADDITIONAL TABLET IN THE AM FOR WEIGHT GAIN AS NEEDED (Patient not taking: Reported on 05/31/2024) 110 tablet 2   JARDIANCE 25 MG TABS tablet Take 25 mg by mouth daily.     metFORMIN  (GLUCOPHAGE -XR) 500 MG 24 hr tablet Take 1,000 mg by mouth 2 (two) times daily.     OXYGEN Inhale 2 L into the lungs at bedtime. Or as needed throughout the day if short of breath     pantoprazole  (PROTONIX ) 40 MG tablet Take 1 tablet (40 mg total) by mouth daily. 30 tablet 0   polyethylene glycol (MIRALAX  / GLYCOLAX ) 17 g packet Take 17 g by mouth daily as needed for mild constipation or moderate constipation. 30 each 0   potassium chloride  SA (KLOR-CON  M) 20 MEQ tablet Take 1 tablet (20 mEq total) by mouth 2 (two) times daily. 10 tablet 0   pregabalin  (LYRICA ) 150 MG capsule Take 1 capsule (150 mg total) by mouth 2 (two) times daily.     senna-docusate (SENOKOT-S) 8.6-50 MG tablet Take 1 tablet by mouth 2 (two) times daily. For constipation 60 tablet 0   spironolactone  (ALDACTONE ) 25 MG tablet TAKE 1 TABLET(25 MG) BY MOUTH EVERY MORNING 90 tablet 3   No current facility-administered medications for this visit.    VITAL SIGNS: LMP 03/13/2015 (Approximate) Comment: more than 2 years ago There were no vitals filed for this visit.   Estimated body mass index is 21.79 kg/m as calculated from the following:   Height as of 05/31/24: 5' 8 (1.727 m).   Weight as of 05/31/24: 143 lb 4.8 oz (65 kg).   PERFORMANCE STATUS (ECOG) : 1 - Symptomatic but completely ambulatory  IMPRESSION: Discussed the use of  AI scribe software for clinical note transcription with the patient, who gave verbal consent to proceed.  History of Present Illness Erica [name] is a 58 year old female who I connected with by phone for symptom management follow-up. No acute distress.  Denies uncontrolled nausea, vomiting, constipation, or diarrhea.  Occasional fatigue.  Reports her appetite fluctuates.  Some days are better than others.  No signs of weight loss at this time.  Erica [name] was recently hospitalized after suspected unintentional overdose requiring multiple doses of Narcan  after being found unresponsive with agonal respirations. Emergency medical services had to forcibly enter her home as the door was locked. Patient reports drinking alcohol which is not in her norm but  was in celebration of her friend's birthday. Her urine UDS did not show use of illicit drugs or opioids however not comprehensive (24 hours after admissions). Alcohol levels were less than 15. Patient states she only took one of her pain pills that day and remains concerned how she ended up in her state as she also did not over indulge in alcohol. At discharged patient was resumed on oxycodone  10mg  every 8 hours.   I discussed safety concerns at great length regarding multiple sedating medications and use of alcohol. She verbalizes understanding. Patient is taking Klonopin  0.5mg  as prescribed by her Psychiatry team. Pregabalin  150mg  three times daily. We will plan to decrease to 100mg  twice daily, continue with oxycodone  as prescribed at discharge. Education provided on discontinuation of pain management in the future if concerns of medication misuse or in addition to other substances such as alcohol or illicit drugs. She verbalized understanding.   Patient is establishing care with Dr. Babara at [facility] cancer center. Education provided on transferring care to [facility] with my colleague Sidra, NP for ongoing palliative and symptom management needs. She verbalized  understanding.   All questions answered and support provided.   I discussed the importance of continued conversation with family and their medical providers regarding overall plan of care and treatment options, ensuring decisions are within the context of the patients values and GOCs. Assessment & Plan  Chronic cancer related pain and neuropathy requiring opioid management Chronic pain managed with oxycodone . Recent hospitalization led to a decrease in pain medication dosage. She reports taking one pill at a time, contrary to hospital notes suggesting more pills could have been taken, which may have contributed to respiratory distress. Transition of pain management to a local provider in [place] is planned for convenience given patient has relocated to [facility] and establishing care at [facility] cancer center. - Provided refill for oxycodone  until follow-up with local provider. -Will prescribe oxycodone  10 mg every 12 hours. - Decrease pregabalin  to 100 mg every 12 hours. - Patient currently on clonazepam  0.5 mg twice daily in addition to Ambien 10 mg as needed at bedtime for insomnia as prescribed by her psychiatrist Mliss Parsley, PA. - Coordinated with Sidra, NP (Palliative) for pain management transition. Patient scheduled to see Dr. Babara on 07/09/24.  - Sent prescription to [facility] on [address].  Recent medication overdose (opioid and/or alcohol) with acute respiratory distress, hypotension, and electrolyte abnormalities Recent hospitalization due to suspected opioid and/or alcohol overdose, resulting in acute respiratory distress, hypotension, and electrolyte abnormalities. Narcan  was administered, indicating a possible opioid component, though she denies opioid use. Alcohol consumption was significant during a special occasion. Hospital notes suggest a mismatch in the events leading to hospitalization, as Narcan  response is atypical for alcohol overdose. - Ensure follow-up with local provider  for ongoing management and monitoring. -Education provided on safe medication use and prescribing. She is aware all pain management will cease in the event of future concerns for misuse or unsafe practices.  -Narcan  as needed   Follow-up Patient understands care will transition to [facility] cancer center.  No follow-up needed ([facility]) at this time.  Patient expressed understanding and was in agreement with this plan. She also understands that She can call the clinic at any time with any questions, concerns, or complaints.   Any controlled substances utilized were prescribed in the context of palliative care. PDMP has been reviewed.   I personally spent a total of 45 minutes in the care of the patient today including  preparing to see the patient, getting/reviewing separately obtained history, counseling and educating, placing orders, referring and communicating with other health care professionals, documenting clinical information in the EHR, independently interpreting results, communicating results, and coordinating care.  Visit consisted of counseling and education dealing with the complex and emotionally intense issues of symptom management and palliative care in the setting of serious and potentially life-threatening illness.  Levon Borer, AGPCNP-BC  Palliative Medicine Team/[facility]

## 2024-06-24 ENCOUNTER — Observation Stay: Admission: EM | Admit: 2024-06-24 | Discharge: 2024-06-24 | Disposition: A | Discharge status: Home / Self Care

## 2024-06-24 ENCOUNTER — Emergency Department

## 2024-06-24 ENCOUNTER — Other Ambulatory Visit: Payer: Self-pay

## 2024-06-24 DIAGNOSIS — J189 Pneumonia, unspecified organism: Secondary | ICD-10-CM

## 2024-06-24 DIAGNOSIS — F10929 Alcohol use, unspecified with intoxication, unspecified: Secondary | ICD-10-CM

## 2024-06-24 DIAGNOSIS — Z79891 Long term (current) use of opiate analgesic: Secondary | ICD-10-CM

## 2024-06-24 DIAGNOSIS — K279 Peptic ulcer, site unspecified, unspecified as acute or chronic, without hemorrhage or perforation: Secondary | ICD-10-CM | POA: Diagnosis present

## 2024-06-24 DIAGNOSIS — J449 Chronic obstructive pulmonary disease, unspecified: Secondary | ICD-10-CM | POA: Diagnosis present

## 2024-06-24 DIAGNOSIS — F17200 Nicotine dependence, unspecified, uncomplicated: Secondary | ICD-10-CM | POA: Diagnosis present

## 2024-06-24 DIAGNOSIS — E872 Acidosis, unspecified: Secondary | ICD-10-CM

## 2024-06-24 DIAGNOSIS — R7989 Other specified abnormal findings of blood chemistry: Secondary | ICD-10-CM

## 2024-06-24 DIAGNOSIS — C20 Malignant neoplasm of rectum: Secondary | ICD-10-CM | POA: Diagnosis present

## 2024-06-24 DIAGNOSIS — I951 Orthostatic hypotension: Principal | ICD-10-CM

## 2024-06-24 DIAGNOSIS — F32A Depression, unspecified: Secondary | ICD-10-CM | POA: Diagnosis present

## 2024-06-24 DIAGNOSIS — I959 Hypotension, unspecified: Secondary | ICD-10-CM | POA: Diagnosis present

## 2024-06-24 LAB — CBC WITH DIFFERENTIAL/PLATELET
Abs Immature Granulocytes: 0.02 K/uL (ref 0.00–0.07)
Basophils Absolute: 0.1 K/uL (ref 0.0–0.1)
Basophils Relative: 1 %
Eosinophils Absolute: 0.4 K/uL (ref 0.0–0.5)
Eosinophils Relative: 8 %
HCT: 33.5 % — ABNORMAL LOW (ref 36.0–46.0)
Hemoglobin: 10.8 g/dL — ABNORMAL LOW (ref 12.0–15.0)
Immature Granulocytes: 0 %
Lymphocytes Relative: 28 %
Lymphs Abs: 1.3 K/uL (ref 0.7–4.0)
MCH: 30.2 pg (ref 26.0–34.0)
MCHC: 32.2 g/dL (ref 30.0–36.0)
MCV: 93.6 fL (ref 80.0–100.0)
Monocytes Absolute: 0.5 K/uL (ref 0.1–1.0)
Monocytes Relative: 10 %
Neutro Abs: 2.4 K/uL (ref 1.7–7.7)
Neutrophils Relative %: 53 %
Platelets: 197 K/uL (ref 150–400)
RBC: 3.58 MIL/uL — ABNORMAL LOW (ref 3.87–5.11)
RDW: 19.6 % — ABNORMAL HIGH (ref 11.5–15.5)
Smear Review: NORMAL
WBC: 4.7 K/uL (ref 4.0–10.5)
nRBC: 0 % (ref 0.0–0.2)

## 2024-06-24 LAB — URINE DRUG SCREEN
Amphetamines: NEGATIVE
Barbiturates: NEGATIVE
Benzodiazepines: NEGATIVE
Cocaine: NEGATIVE
Fentanyl: NEGATIVE
Methadone Scn, Ur: NEGATIVE
Opiates: NEGATIVE
Tetrahydrocannabinol: NEGATIVE

## 2024-06-24 LAB — URINALYSIS, W/ REFLEX TO CULTURE (INFECTION SUSPECTED)
Bacteria, UA: NONE SEEN
Bilirubin Urine: NEGATIVE
Glucose, UA: >=500 mg/dL — AB
Hgb urine dipstick: NEGATIVE
Ketones, ur: NEGATIVE mg/dL
Leukocytes,Ua: NEGATIVE
Nitrite: NEGATIVE
Protein, ur: NEGATIVE mg/dL
Specific Gravity, Urine: 1.002 — ABNORMAL LOW (ref 1.005–1.030)
pH: 6 (ref 5.0–8.0)

## 2024-06-24 LAB — RESP PANEL BY RT-PCR (RSV, FLU A&B, COVID)  RVPGX2
Influenza A by PCR: NEGATIVE
Influenza B by PCR: NEGATIVE
Resp Syncytial Virus by PCR: NEGATIVE
SARS Coronavirus 2 by RT PCR: NEGATIVE

## 2024-06-24 LAB — COMPREHENSIVE METABOLIC PANEL WITH GFR
ALT: 8 U/L (ref 0–44)
AST: 14 U/L — ABNORMAL LOW (ref 15–41)
Albumin: 3.6 g/dL (ref 3.5–5.0)
Alkaline Phosphatase: 93 U/L (ref 38–126)
Anion gap: 13 (ref 5–15)
BUN: 10 mg/dL (ref 6–20)
CO2: 24 mmol/L (ref 22–32)
Calcium: 8.5 mg/dL — ABNORMAL LOW (ref 8.9–10.3)
Chloride: 102 mmol/L (ref 98–111)
Creatinine, Ser: 0.8 mg/dL (ref 0.44–1.00)
GFR, Estimated: >60 mL/min (ref >60)
Glucose, Bld: 91 mg/dL (ref 70–99)
Potassium: 3.9 mmol/L (ref 3.5–5.1)
Sodium: 139 mmol/L (ref 135–145)
Total Bilirubin: <0.2 mg/dL (ref 0.0–1.2)
Total Protein: 6.3 g/dL — ABNORMAL LOW (ref 6.5–8.1)

## 2024-06-24 LAB — LACTIC ACID, PLASMA
Lactic Acid, Venous: 3.1 mmol/L (ref 0.5–1.9)
Lactic Acid, Venous: 3.9 mmol/L (ref 0.5–1.9)
Lactic Acid, Venous: 1.4 mmol/L (ref 0.5–1.9)

## 2024-06-24 LAB — PROTIME-INR
INR: 0.9 (ref 0.8–1.2)
Prothrombin Time: 13.1 s (ref 11.4–15.2)

## 2024-06-24 LAB — ETHANOL: Alcohol, Ethyl (B): 169 mg/dL — ABNORMAL HIGH (ref <15)

## 2024-06-24 LAB — TROPONIN T, HIGH SENSITIVITY: Troponin T High Sensitivity: 35 ng/L — ABNORMAL HIGH (ref 0–19)

## 2024-06-24 LAB — PRO BRAIN NATRIURETIC PEPTIDE: Pro Brain Natriuretic Peptide: 286 pg/mL (ref <300.0)

## 2024-06-24 LAB — PROCALCITONIN: Procalcitonin: 0.1 ng/mL

## 2024-06-24 MED ORDER — KETOROLAC TROMETHAMINE 30 MG/ML IJ SOLN: 30.0000 mg | Freq: Four times a day (QID) | INTRAMUSCULAR | Status: DC | PRN

## 2024-06-24 MED ORDER — SODIUM CHLORIDE 0.9 % IV SOLN
2.0000 g | Freq: Once | INTRAVENOUS | Status: AC
  Administered 2024-06-24: 2 g via INTRAVENOUS
  Filled 2024-06-24: qty 12.5

## 2024-06-24 MED ORDER — METRONIDAZOLE 500 MG/100ML IV SOLN
500.0000 mg | Freq: Once | INTRAVENOUS | Status: AC
  Administered 2024-06-24: 500 mg via INTRAVENOUS
  Filled 2024-06-24: qty 100

## 2024-06-24 MED ORDER — THIAMINE HCL 100 MG/ML IJ SOLN
100.0000 mg | Freq: Once | INTRAMUSCULAR | Status: AC
  Administered 2024-06-24: 100 mg via INTRAVENOUS
  Filled 2024-06-24: qty 2

## 2024-06-24 MED ORDER — ONDANSETRON HCL 4 MG PO TABS: 4.0000 mg | ORAL_TABLET | Freq: Four times a day (QID) | ORAL | Status: DC | PRN

## 2024-06-24 MED ORDER — ALBUTEROL SULFATE (2.5 MG/3ML) 0.083% IN NEBU: 2.5000 mg | INHALATION_SOLUTION | RESPIRATORY_TRACT | Status: DC | PRN

## 2024-06-24 MED ORDER — LACTATED RINGERS IV BOLUS (SEPSIS)
1000.0000 mL | Freq: Once | INTRAVENOUS | Status: AC
  Administered 2024-06-24: 1000 mL via INTRAVENOUS

## 2024-06-24 MED ORDER — LORAZEPAM 1 MG PO TABS: 1.0000 mg | ORAL_TABLET | ORAL | Status: DC | PRN

## 2024-06-24 MED ORDER — OXYCODONE HCL 5 MG PO TABS: 5.0000 mg | ORAL_TABLET | ORAL | Status: DC | PRN

## 2024-06-24 MED ORDER — LACTATED RINGERS IV BOLUS (SEPSIS)
250.0000 mL | Freq: Once | INTRAVENOUS | Status: AC
  Administered 2024-06-24: 250 mL via INTRAVENOUS

## 2024-06-24 MED ORDER — ONDANSETRON HCL 4 MG/2ML IJ SOLN: 4.0000 mg | Freq: Four times a day (QID) | INTRAMUSCULAR | Status: DC | PRN

## 2024-06-24 MED ORDER — LACTATED RINGERS IV SOLN: INTRAVENOUS | Status: DC

## 2024-06-24 MED ORDER — LORAZEPAM 2 MG/ML IJ SOLN: 1.0000 mg | INTRAMUSCULAR | Status: DC | PRN

## 2024-06-24 MED ORDER — ACETAMINOPHEN 325 MG PO TABS: 650.0000 mg | ORAL_TABLET | Freq: Four times a day (QID) | ORAL | Status: DC | PRN

## 2024-06-24 MED ORDER — ENOXAPARIN SODIUM 40 MG/0.4ML IJ SOSY
40.0000 mg | PREFILLED_SYRINGE | INTRAMUSCULAR | Status: DC
  Administered 2024-06-24: 40 mg via SUBCUTANEOUS
  Filled 2024-06-24: qty 0.4

## 2024-06-24 MED ORDER — FOLIC ACID 1 MG PO TABS
1.0000 mg | ORAL_TABLET | Freq: Every day | ORAL | Status: DC
  Administered 2024-06-24: 1 mg via ORAL
  Filled 2024-06-24: qty 1

## 2024-06-24 MED ORDER — ACETAMINOPHEN 650 MG RE SUPP: 650.0000 mg | Freq: Four times a day (QID) | RECTAL | Status: DC | PRN

## 2024-06-24 MED ORDER — VANCOMYCIN HCL IN DEXTROSE 1-5 GM/200ML-% IV SOLN
1000.0000 mg | Freq: Once | INTRAVENOUS | Status: AC
  Administered 2024-06-24: 1000 mg via INTRAVENOUS
  Filled 2024-06-24: qty 200

## 2024-06-24 MED ORDER — ADULT MULTIVITAMIN W/MINERALS CH
1.0000 | ORAL_TABLET | Freq: Every day | ORAL | Status: DC
  Administered 2024-06-24: 1 via ORAL
  Filled 2024-06-24: qty 1

## 2024-06-24 MED ORDER — THIAMINE HCL 100 MG/ML IJ SOLN: 100.0000 mg | Freq: Every day | INTRAMUSCULAR | Status: DC

## 2024-06-24 MED ORDER — THIAMINE MONONITRATE 100 MG PO TABS
100.0000 mg | ORAL_TABLET | Freq: Every day | ORAL | Status: DC
  Administered 2024-06-24: 100 mg via ORAL
  Filled 2024-06-24: qty 1

## 2024-06-24 MED ORDER — NICOTINE 21 MG/24HR TD PT24
21.0000 mg | MEDICATED_PATCH | Freq: Every day | TRANSDERMAL | Status: DC
  Filled 2024-06-24: qty 1

## 2024-06-24 MED ORDER — SODIUM CHLORIDE 0.9 % IV SOLN: INTRAVENOUS | Status: DC

## 2024-06-24 NOTE — Assessment & Plan Note (Signed)
 Chronic musculoskeletal pain and cancer related pain History of unintentional overdose 05/31/2024 Judicious pain medication use

## 2024-06-24 NOTE — ED Triage Notes (Signed)
 Pt here via EMS from home because friend called due to pt not feeling well and SOB. EMS notes orthostatic hypotension (94/55 sitting, and 70/50 standing) at arrival. EtOH on board. Pt A&Ox4. Hx of recent double pneumonia (on doxy), CHF, colon and lung cancer (not currently being treated). Pt denies N/V/D/fever. Not in distress.

## 2024-06-24 NOTE — Assessment & Plan Note (Signed)
No acute issues suspected at this time

## 2024-06-24 NOTE — Discharge Summary (Signed)
 AGAINST MEDICAL ADVICE DISCHARGE SUMMARY   Patient name: Erica [name]  Admit date:     06/24/2024  Discharge date: 06/24/2024  Attending Physician: CLEATUS DELAYNE GAILS [[id number]]  Discharge Physician: Duffy Larch   PCP: Tobie Domino, MD   Disposition: Left Against Medical Advice  Follow-up Appointments: Not able to be arranged or discussed as patient LEFT AMA  Diagnoses at the time the patient left AMA: Principal Problem:   Hypotension Active Problems:   Lactic acidosis   Alcohol intoxication   Long term prescription opiate use   Tobacco use disorder   Depression   Peptic ulcer disease   COPD (chronic obstructive pulmonary disease) (HCC)   Rectal cancer [facility])   Discharge Condition: {condition:18240}  Brief Hospital Course: Please see the H&P, consult and daily progress notes for the complete hospital course, but briefly:  No notes on file   {**HELPTEXT**keep this if you talked to the patient} ***Patient assessed and determined to have decision-making capacity. Risks of leaving the hospital at this time, including risk of death, permanent disability, and worsening illness, were explained in detail. Benefits of continued inpatient care and alternative treatment options were discussed. Patient verbalized understanding of these risks and benefits and chose to leave against medical advice. The decision was voluntary and without coercion. Patient was advised to return at any time for further care, provided prescriptions/follow-up instructions as appropriate, and encouraged to seek emergency attention if symptoms worsen. Patient signed AMA form (witnessed), and documentation placed in chart.  {**HELPTEXT**>keep this if you did not get a chance to talk to the patient} ***Patient informed RN of desire to leave the hospital against medical advice and departed prior to provider evaluation. As such, I was unable to assess decision-making capacity, review risks/benefits of continued  hospitalization, or provide discharge counseling. Patient left the unit voluntarily and without medical clearance. RN staff encouraged the patient to remain for discussion with the medical team; however, patient declined. Prescriptions, follow-up instructions, and AMA documentation could not be provided given the circumstances. ***Patient has been advised by staff at time of departure {**HELPTEXT**>if documented</**HELPTEXT**} to return to the hospital at any time for further care or if symptoms worsen.   Physical Exam: Pulse Rate: (!) 106 Resp: (!) 24 BP: (!) 162/82 Temp: 98.4 F (36.9 C) Weight: 71.7 kg  Vitals:   06/24/24 0905 06/24/24 1200 06/24/24 1342 06/24/24 1430  BP: 125/61 123/72 (!) 149/83 (!) 162/82  Pulse: 100 (!) 106 (!) 105 (!) 106  Resp: 20  18 (!) 24  Temp: (!) 97.5 F (36.4 C)  98.4 F (36.9 C)   TempSrc: Oral  Oral   SpO2: 95%  95% 95%  Weight:      Height:         Arland DELENA Arts wished to sign out Against Medical Advice before a full Physical Exam could be performed. Please see the Physical Exam portion of the previous progress notes for the most recent examination.  Discharge Instructions: The patient elected to leave the hospital Against Medical Advice before formal discharge instructions could be provided to her. She was advised to follow-up with her Primary Care Provider and was made aware that she could return to the Emergency Department at any time if additional issues arise, she felt worse, or changed her mind.  Day of Discharge Wt Readings from Last 3 Encounters:  06/24/24 71.7 kg  05/31/24 65 kg  05/26/24 65 kg   Temp Readings from Last 3 Encounters:  06/24/24 98.4 F (  36.9 C) (Oral)  06/04/24 (!) 97.4 F (36.3 C)  05/26/24 (!) 97.3 F (36.3 C) (Oral)   BP Readings from Last 3 Encounters:  06/24/24 (!) 162/82  06/04/24 105/71  05/26/24 107/68   Pulse Readings from Last 3 Encounters:  06/24/24 (!) 106  06/04/24 83  05/26/24 75      Daphnee Preiss Al-Sultani, MD [facility] 06/24/2024 6:17 PM

## 2024-06-24 NOTE — Assessment & Plan Note (Signed)
 Not acutely exacerbated DuoNebs as needed

## 2024-06-24 NOTE — Assessment & Plan Note (Signed)
 Suspect secondary to dehydration and alcohol use Low suspicion for sepsis at this time Monitor for clearing with IV fluid resuscitation

## 2024-06-24 NOTE — Assessment & Plan Note (Signed)
 Alcohol withdrawal protocol Fall and aspiration precautions

## 2024-06-24 NOTE — Progress Notes (Signed)
 CODE SEPSIS - PHARMACY COMMUNICATION  **Broad Spectrum Antibiotics should be administered within 1 hour of Sepsis diagnosis**  Time Code Sepsis Called/Page Received:  12/7 @ 0310   Antibiotics Ordered: Cefepime , metronidazole , Vancomycin    Time of 1st antibiotic administration: Cefepime  2 gm IV X 1 on 12/7 @ 0356   Additional action taken by pharmacy:   If necessary, Name of Provider/Nurse Contacted:     Aiyah Scarpelli D ,PharmD Clinical Pharmacist  06/24/2024  5:16 AM

## 2024-06-24 NOTE — H&P (Signed)
 History and Physical    Patient: Erica [name] FMW:[id number] DOB: Jan 23, 1966 DOA: 06/24/2024 DOS: the patient was seen and examined on 06/24/2024 PCP: Tobie Domino, MD  Patient coming from: Home  Chief Complaint:  Chief Complaint  Patient presents with   Hypotension    HPI: Erica [name] is a 58 y.o. female with medical history significant for stage IV rectal cancer, CHF, COPD on 2 L,HTN, alcohol and tobacco use disorder, chronic  pain on chronic opioids with admission a month ago (11/13-11/17/25) for unintentional drug overdose (opiate plus benzo plus alcohol) with aspiration pneumonia, who was brought in by EMS after her daughter called 911.  Patient unable to contribute to history due to being intoxicated but states that she does not feel well.  With EMS she was orthostatic with BP 94/55 sitting and 70/50 standing.  When asked if she still takes her blood pressure medication she said yes. Per review of recent discharge summary patient was noted to be persistently hypotensive and was placed on midodrine  which was discontinued prior to discharge.  Unclear whether she is taking diltiazem  noted on her med list as recently as a month ago. On arrival, BP lying was 107/65 with otherwise normal vitals. Labs were notable for lactic acid of 3.1, EtOH of 169, negative UDS CBC, CMP, procalcitonin, proBNP, UA for the most part within normal limits EKG showed sinus at 82 Chest x-ray showed improvement in hilar opacity from 3 weeks prior.  head CT nonacute Patient was treated with IV fluids and started on cefepime  Flagyl  and vancomycin  for possible sepsis Admission requested     Past Medical History:  Diagnosis Date   (HFpEF) heart failure with preserved ejection fraction (HCC)    a. 05/2015 Echo: EF 60-65%, no rwma, PASP .   Acute pancreatitis 08/13/2018   Anxiety    Arthritis    Asthma    Bell's palsy    no deficit   Bronchitis    Cancer (HCC) 12/2022   rectal cancer   CHF  (congestive heart failure) (HCC)    COPD (chronic obstructive pulmonary disease) (HCC)    Depression    Diabetes mellitus without complication (HCC)    type II 03/2017   Dyspnea    Fatty liver    per patient   Gall stones    per patient   Gastric ulcer    Hyperlipidemia    Hypertension    Lung nodule 12/2022   upper left   OSA (obstructive sleep apnea)    a. did not tolerate CPAP. On oxygen 2L via [place]   Pancreatitis    07/2018   Shoulder injury    6/19   Smoker    Past Surgical History:  Procedure Laterality Date   BRONCHIAL NEEDLE ASPIRATION BIOPSY  01/27/2023   Procedure: BRONCHIAL NEEDLE ASPIRATION BIOPSIES;  Surgeon: Isadora Hose, MD;  Location: [facility] ENDOSCOPY;  Service: Pulmonary;;   COLONOSCOPY WITH PROPOFOL  N/A 12/15/2022   Procedure: COLONOSCOPY WITH PROPOFOL ;  Surgeon: Therisa Bi, MD;  Location: [facility] ENDOSCOPY;  Service: Gastroenterology;  Laterality: N/A;   COLONOSCOPY WITH PROPOFOL  N/A 12/16/2022   Procedure: COLONOSCOPY WITH PROPOFOL ;  Surgeon: Therisa Bi, MD;  Location: [facility] ENDOSCOPY;  Service: Gastroenterology;  Laterality: N/A;   FLEXIBLE BRONCHOSCOPY N/A 06/20/2015   Procedure: FLEXIBLE BRONCHOSCOPY;  Surgeon: Laurance Flor, MD;  Location: [facility] ORS;  Service: Pulmonary;  Laterality: N/A;   IR EXCHANGE BILIARY DRAIN  01/19/2024   IR IMAGING GUIDED PORT INSERTION  02/25/2023   IR PERC  CHOLECYSTOSTOMY  11/25/2023   IR REMOVAL BILIARY DRAIN  02/02/2024   KNEE SURGERY Left    8 knee surgeries   Social History:  reports that she has been smoking cigarettes. She has a 138 pack-year smoking history. She has never used smokeless tobacco. She reports current alcohol use. She reports that she does not use drugs.  Allergies  Allergen Reactions   Tramadol Palpitations and Other (See Comments)    Heart Skipping Beats   Penicillins Rash    Family History  Problem Relation Age of Onset   Breast cancer Mother        early 27's   Lung cancer Father    Brain cancer  Father    Bone cancer Father    Colon cancer Maternal Aunt    Cancer Maternal Uncle        liver?   Diabetes Maternal Grandmother    Asthma Other     Prior to Admission medications   Medication Sig Start Date End Date Taking? Authorizing Provider  Accu-Chek FastClix Lancets MISC Apply topically 2 (two) times daily. 01/17/24   [provider]  ACCU-CHEK GUIDE TEST test strip 2 (two) times daily. 01/16/24   [provider]  albuterol  (VENTOLIN  HFA) 108 (90 Base) MCG/ACT inhaler Inhale 2 puffs into the lungs every 6 (six) hours as needed for wheezing or shortness of breath.     [provider]  busPIRone  (BUSPAR ) 30 MG tablet Take 30 mg by mouth 2 (two) times daily. PER PT STATES HER DOSE WAS INCREASED 02/14/20   [provider]  clonazePAM  (KLONOPIN ) 0.5 MG tablet Take 0.5 mg by mouth 2 (two) times daily. 03/12/20   [provider]  cyclobenzaprine  (FLEXERIL ) 10 MG tablet Take 1 tablet (10 mg total) by mouth 3 (three) times daily as needed for muscle spasms. 02/16/24   Pickenpack-Cousar, Athena N, NP  diltiazem  (CARDIZEM  CD) 120 MG 24 hr capsule TAKE ONE CAPSULE BY MOUTH EVERY DAY 05/21/24   Gollan, Timothy J, MD  diltiazem  (CARDIZEM ) 30 MG tablet TAKE 1 TABLET(30 MG) BY MOUTH THREE TIMES DAILY AS NEEDED 02/16/24   Gollan, Timothy J, MD  DULoxetine  (CYMBALTA ) 60 MG capsule Take 60 mg by mouth 2 (two) times daily.     [provider]  Fluticasone -Salmeterol (ADVAIR) 250-50 MCG/DOSE AEPB Inhale 1 puff into the lungs 2 (two) times daily.    [provider]  furosemide  (LASIX ) 40 MG tablet TAKE 1 TABLET BY MOUTH EVERY DAY. MAY TAKE ADDITIONAL TABLET IN THE AM FOR WEIGHT GAIN AS NEEDED Patient not taking: Reported on 05/31/2024 03/10/23   Gollan, Timothy J, MD  JARDIANCE 25 MG TABS tablet Take 25 mg by mouth daily. 08/18/21   [provider]  metFORMIN  (GLUCOPHAGE -XR) 500 MG 24 hr tablet Take 1,000 mg by mouth 2 (two) times daily. 10/21/22    [provider]  oxyCODONE  10 MG TABS Take 1.5 tablets (15 mg total) by mouth every 12 (twelve) hours as needed for pain. 06/21/24   Pickenpack-Cousar, Fannie SAILOR, NP  OXYGEN Inhale 2 L into the lungs at bedtime. Or as needed throughout the day if short of breath    [provider]  pantoprazole  (PROTONIX ) 40 MG tablet Take 1 tablet (40 mg total) by mouth daily. 05/04/18   Delores Raford SAILOR, MD  polyethylene glycol (MIRALAX  / GLYCOLAX ) 17 g packet Take 17 g by mouth daily as needed for mild constipation or moderate constipation. 11/27/23   Davia Nydia POUR, MD  potassium chloride  SA (KLOR-CON  M) 20 MEQ tablet Take 1 tablet (20 mEq total) by mouth 2 (two) times daily. 05/16/24   Ward, Josette SAILOR, DO  pregabalin  (LYRICA ) 100 MG capsule Take 1 capsule (100 mg total) by mouth 2 (two) times daily. 06/21/24   Pickenpack-Cousar, Fannie SAILOR, NP  senna-docusate (SENOKOT-S) 8.6-50 MG tablet Take 1 tablet by mouth 2 (two) times daily. For constipation 11/27/23   Rai, Ripudeep K, MD  spironolactone  (ALDACTONE ) 25 MG tablet TAKE 1 TABLET(25 MG) BY MOUTH EVERY MORNING 05/23/23   Gollan, Timothy J, MD    Physical Exam: Vitals:   06/24/24 0219 06/24/24 0226  BP:  107/65  Pulse:  83  Resp:  18  Temp:  98.7 F (37.1 C)  TempSrc:  Oral  SpO2:  100%  Weight: 71.7 kg   Height: 5' 8 (1.727 m)    Physical Exam Vitals and nursing note reviewed.  Constitutional:      General: She is not in acute distress.    Comments: Somnolent, lethargic  HENT:     Head: Normocephalic and atraumatic.  Cardiovascular:     Rate and Rhythm: Normal rate and regular rhythm.     Heart sounds: Normal heart sounds.  Pulmonary:     Effort: Pulmonary effort is normal.     Breath sounds: Normal breath sounds.  Abdominal:     Palpations: Abdomen is soft.     Tenderness: There is no abdominal tenderness.  Neurological:     Mental Status: Mental status is at baseline.     Labs on Admission: I have personally reviewed  following labs and imaging studies  CBC: Recent Labs  Lab 06/24/24 0228  WBC 4.7  NEUTROABS 2.4  HGB 10.8*  HCT 33.5*  MCV 93.6  PLT 197   Basic Metabolic Panel: Recent Labs  Lab 06/24/24 0228  NA 139  K 3.9  CL 102  CO2 24  GLUCOSE 91  BUN 10  CREATININE 0.80  CALCIUM  8.5*   GFR: Estimated Creatinine Clearance: 76.4 mL/min (by C-G formula based on SCr of 0.8 mg/dL). Liver Function Tests: Recent Labs  Lab 06/24/24 0228  AST 14*  ALT 8  ALKPHOS 93  BILITOT <0.2  PROT 6.3*  ALBUMIN 3.6   No results for input(s): LIPASE, AMYLASE in the last 168 hours. No results for input(s): AMMONIA in the last 168 hours. Coagulation Profile: Recent Labs  Lab 06/24/24 0228  INR 0.9   Cardiac Enzymes: No results for input(s): CKTOTAL, CKMB, CKMBINDEX, TROPONINI in the last 168 hours. BNP (last 3 results) Recent Labs    06/24/24 0228  PROBNP 286.0   HbA1C: No results for input(s): HGBA1C in the last 72 hours. CBG: No results for input(s): GLUCAP in the last 168 hours. Lipid Profile: No results for input(s): CHOL, HDL, LDLCALC, TRIG, CHOLHDL, LDLDIRECT in the last 72 hours. Thyroid Function Tests: No results for input(s): TSH, T4TOTAL, FREET4, T3FREE, THYROIDAB in the last 72 hours. Anemia Panel: No results for input(s): VITAMINB12, FOLATE, FERRITIN, TIBC, IRON, RETICCTPCT in the last 72 hours. Urine analysis:    Component Value Date/Time   COLORURINE STRAW (A) 06/24/2024 0310   APPEARANCEUR CLEAR (A) 06/24/2024 0310   APPEARANCEUR Clear 02/26/2014 2019   LABSPEC 1.002 (L) 06/24/2024 0310   LABSPEC 1.014 02/26/2014 2019   PHURINE 6.0 06/24/2024 0310   GLUCOSEU >=500 (A) 06/24/2024 0310   GLUCOSEU Negative 02/26/2014 2019   HGBUR NEGATIVE 06/24/2024 0310   BILIRUBINUR NEGATIVE 06/24/2024 0310   BILIRUBINUR  Negative 02/26/2014 2019   KETONESUR NEGATIVE 06/24/2024 0310   PROTEINUR NEGATIVE 06/24/2024 0310    NITRITE NEGATIVE 06/24/2024 0310   LEUKOCYTESUR NEGATIVE 06/24/2024 0310   LEUKOCYTESUR Trace 02/26/2014 2019    Radiological Exams on Admission: DG Chest Port 1 View Result Date: 06/24/2024 EXAM: 1 VIEW(S) XRAY OF THE CHEST 06/24/2024 03:53:00 AM COMPARISON: 05/31/2024 CLINICAL HISTORY: Questionable sepsis - evaluate for abnormality FINDINGS: LINES, TUBES AND DEVICES: Right chest port with tip in SVC. LUNGS AND PLEURA: Improving left hilar opacity with superimposed linear atelectasis. Mild left basilar atelectasis. Follow-up chest radiograph is recommended in 3-4 weeks, following conservative therapy, to document resolution. If persistent at that time, dedicated contrast-enhanced CT imaging of the chest is recommended for further evaluation. No pleural effusion. No pneumothorax. HEART AND MEDIASTINUM: No acute abnormality of the cardiac and mediastinal silhouettes. BONES AND SOFT TISSUES: Remote right clavicle fracture. No acute osseous abnormality. IMPRESSION: 1. Improving left hilar opacity with superimposed linear atelectasis and mild left basilar atelectasis. Follow-up chest radiograph in 3-4 weeks is recommended to document resolution, with contrast-enhanced chest CT if findings persist. Electronically signed by: Dorethia Molt MD 06/24/2024 04:06 AM EST RP Workstation: [id number]   CT HEAD WO CONTRAST ( ) Result Date: 06/24/2024 EXAM: CT HEAD WITHOUT CONTRAST 06/24/2024 03:29:49 AM TECHNIQUE: CT of the head was performed without the administration of intravenous contrast. Automated exposure control, iterative reconstruction, and/or weight based adjustment of the mA/kV was utilized to reduce the radiation dose to as low as reasonably achievable. COMPARISON: CT Head May 31, 2024 CLINICAL HISTORY: Delirium FINDINGS: BRAIN AND VENTRICLES: No acute hemorrhage. No evidence of acute infarct. No hydrocephalus. No extra-axial collection. No mass effect or midline shift. ORBITS: No acute abnormality.  SINUSES: No acute abnormality. SOFT TISSUES AND SKULL: No acute soft tissue abnormality. No skull fracture. IMPRESSION: 1. No acute intracranial abnormality. Electronically signed by: Gilmore Molt MD 06/24/2024 03:33 AM EST RP Workstation: [id number]   Data Reviewed for HPI: Relevant notes from primary care and specialist visits, past discharge summaries as available in EHR, including Care Everywhere. Prior diagnostic testing as pertinent to current admission diagnoses Updated medications and problem lists for reconciliation ED course, including vitals, labs, imaging, treatment and response to treatment Triage notes, nursing and pharmacy notes and ED provider's notes Notable results as noted above in HPI      Assessment and Plan: * Hypotension Possibly chronic-noted during recent past admission(required midodrine ) Suspect related to dehydration in the setting of intoxication, possibly poor oral intake Concern for possible antihypertensive related hypotension as well (diltiazem  on med list in November) Low suspicion for sepsis is not meeting criteria Continue IV fluids Holding off on antibiotics given procalcitonin less than 0.10(received broad-spectrum in the ED) Follow-up blood cultures Fall precautions  Lactic acidosis Suspect secondary to dehydration and alcohol use Low suspicion for sepsis at this time Monitor for clearing with IV fluid resuscitation  Alcohol intoxication Alcohol withdrawal protocol Fall and aspiration precautions  Long term prescription opiate use Chronic musculoskeletal pain and cancer related pain History of unintentional overdose 05/31/2024 Judicious pain medication use  Rectal cancer (HCC) No acute issues suspected at this time  COPD (chronic obstructive pulmonary disease) (HCC) Not acutely exacerbated DuoNebs as needed  Peptic ulcer disease Protonix  as needed  Depression Resume home meds pending med rec  Tobacco use disorder Nicotine   patch     DVT prophylaxis: Lovenox   Consults: none  Advance Care Planning:   Code Status: Prior   Family Communication: none  Disposition Plan:  Back to previous home environment  Severity of Illness: The appropriate patient status for this patient is OBSERVATION. Observation status is judged to be reasonable and necessary in order to provide the required intensity of service to ensure the patient's safety. The patient's presenting symptoms, physical exam findings, and initial radiographic and laboratory data in the context of their medical condition is felt to place them at decreased risk for further clinical deterioration. Furthermore, it is anticipated that the patient will be medically stable for discharge from the hospital within 2 midnights of admission.   Author: Delayne LULLA Solian, MD 06/24/2024 5:24 AM  For on call review [email or web address].

## 2024-06-24 NOTE — ED Provider Notes (Signed)
 [facility] Provider Note    Event Date/Time   First MD Initiated Contact with Patient 06/24/24 [phone]     (approximate)   History   Hypotension   HPI  Erica [name] is a 58 y.o. female history of alcohol use disorder, heart failure with preserved ejection fraction, COPD, hypertension, diabetes, hyperlipidemia, rectal cancer who presents to the emergency department after her daughter called 911.  Patient is intoxicated and unable to provide much history.  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  EMS reports that patient was orthostatic with them with a blood pressure of 70/50 with standing.  She reports drinking alcohol regularly but denies drinking every day.  Denies any drug use.  She denies fall, head injury, chest pain or shortness of breath, fevers, cough, vomiting, diarrhea.  Was admitted to the hospital May 31, 2024 for pneumonia.  States that she just picked up her outpatient Levaquin  today but did not start it.   History provided by patient and EMS.    Past Medical History:  Diagnosis Date   (HFpEF) heart failure with preserved ejection fraction (HCC)    a. 05/2015 Echo: EF 60-65%, no rwma, PASP .   Acute pancreatitis 08/13/2018   Anxiety    Arthritis    Asthma    Bell's palsy    no deficit   Bronchitis    Cancer (HCC) 12/2022   rectal cancer   CHF (congestive heart failure) (HCC)    COPD (chronic obstructive pulmonary disease) (HCC)    Depression    Diabetes mellitus without complication (HCC)    type II 03/2017   Dyspnea    Fatty liver    per patient   Gall stones    per patient   Gastric ulcer    Hyperlipidemia    Hypertension    Lung nodule 12/2022   upper left   OSA (obstructive sleep apnea)    a. did not tolerate CPAP. On oxygen 2L via [place]   Pancreatitis    07/2018   Shoulder injury    6/19   Smoker     Past Surgical History:  Procedure Laterality Date    BRONCHIAL NEEDLE ASPIRATION BIOPSY  01/27/2023   Procedure: BRONCHIAL NEEDLE ASPIRATION BIOPSIES;  Surgeon: Isadora Hose, MD;  Location: [facility] ENDOSCOPY;  Service: Pulmonary;;   COLONOSCOPY WITH PROPOFOL  N/A 12/15/2022   Procedure: COLONOSCOPY WITH PROPOFOL ;  Surgeon: Therisa Bi, MD;  Location: [facility] ENDOSCOPY;  Service: Gastroenterology;  Laterality: N/A;   COLONOSCOPY WITH PROPOFOL  N/A 12/16/2022   Procedure: COLONOSCOPY WITH PROPOFOL ;  Surgeon: Therisa Bi, MD;  Location: [facility] ENDOSCOPY;  Service: Gastroenterology;  Laterality: N/A;   FLEXIBLE BRONCHOSCOPY N/A 06/20/2015   Procedure: FLEXIBLE BRONCHOSCOPY;  Surgeon: Laurance Flor, MD;  Location: [facility] ORS;  Service: Pulmonary;  Laterality: N/A;   IR EXCHANGE BILIARY DRAIN  01/19/2024   IR IMAGING GUIDED PORT INSERTION  02/25/2023   IR PERC CHOLECYSTOSTOMY  11/25/2023   IR REMOVAL BILIARY DRAIN  02/02/2024   KNEE SURGERY Left    8 knee surgeries    MEDICATIONS:  Prior to Admission medications   Medication Sig Start Date End Date Taking? Authorizing Provider  Accu-Chek FastClix Lancets MISC Apply topically 2 (two) times daily. 01/17/24   [provider]  ACCU-CHEK GUIDE TEST test strip 2 (two) times daily. 01/16/24   [provider]  albuterol  (VENTOLIN  HFA) 108 (90 Base) MCG/ACT inhaler Inhale 2  puffs into the lungs every 6 (six) hours as needed for wheezing or shortness of breath.     [provider]  busPIRone  (BUSPAR ) 30 MG tablet Take 30 mg by mouth 2 (two) times daily. PER PT STATES HER DOSE WAS INCREASED 02/14/20   [provider]  clonazePAM  (KLONOPIN ) 0.5 MG tablet Take 0.5 mg by mouth 2 (two) times daily. 03/12/20   [provider]  cyclobenzaprine  (FLEXERIL ) 10 MG tablet Take 1 tablet (10 mg total) by mouth 3 (three) times daily as needed for muscle spasms. 02/16/24   Pickenpack-Cousar, Athena N, NP  diltiazem  (CARDIZEM  CD) 120 MG 24 hr capsule TAKE ONE CAPSULE BY MOUTH EVERY DAY 05/21/24   Gollan,  Timothy J, MD  diltiazem  (CARDIZEM ) 30 MG tablet TAKE 1 TABLET(30 MG) BY MOUTH THREE TIMES DAILY AS NEEDED 02/16/24   Gollan, Timothy J, MD  DULoxetine  (CYMBALTA ) 60 MG capsule Take 60 mg by mouth 2 (two) times daily.     [provider]  Fluticasone -Salmeterol (ADVAIR) 250-50 MCG/DOSE AEPB Inhale 1 puff into the lungs 2 (two) times daily.    [provider]  furosemide  (LASIX ) 40 MG tablet TAKE 1 TABLET BY MOUTH EVERY DAY. MAY TAKE ADDITIONAL TABLET IN THE AM FOR WEIGHT GAIN AS NEEDED Patient not taking: Reported on 05/31/2024 03/10/23   Gollan, Timothy J, MD  JARDIANCE 25 MG TABS tablet Take 25 mg by mouth daily. 08/18/21   [provider]  metFORMIN  (GLUCOPHAGE -XR) 500 MG 24 hr tablet Take 1,000 mg by mouth 2 (two) times daily. 10/21/22   [provider]  oxyCODONE  10 MG TABS Take 1.5 tablets (15 mg total) by mouth every 12 (twelve) hours as needed for pain. 06/21/24   Pickenpack-Cousar, Fannie SAILOR, NP  OXYGEN Inhale 2 L into the lungs at bedtime. Or as needed throughout the day if short of breath    [provider]  pantoprazole  (PROTONIX ) 40 MG tablet Take 1 tablet (40 mg total) by mouth daily. 05/04/18   Delores Raford SAILOR, MD  polyethylene glycol (MIRALAX  / GLYCOLAX ) 17 g packet Take 17 g by mouth daily as needed for mild constipation or moderate constipation. 11/27/23   Rai, Nydia POUR, MD  potassium chloride  SA (KLOR-CON  M) 20 MEQ tablet Take 1 tablet (20 mEq total) by mouth 2 (two) times daily. 05/16/24   Kassem Kibbe, Josette SAILOR, DO  pregabalin  (LYRICA ) 100 MG capsule Take 1 capsule (100 mg total) by mouth 2 (two) times daily. 06/21/24   Pickenpack-Cousar, Fannie SAILOR, NP  senna-docusate (SENOKOT-S) 8.6-50 MG tablet Take 1 tablet by mouth 2 (two) times daily. For constipation 11/27/23   Rai, Nydia POUR, MD  spironolactone  (ALDACTONE ) 25 MG tablet TAKE 1 TABLET(25 MG) BY MOUTH EVERY MORNING 05/23/23   Perla Evalene PARAS, MD    Physical Exam   Triage Vital Signs: ED  Triage Vitals  Encounter Vitals Group     BP 06/24/24 0226 107/65     Girls Systolic BP Percentile --      Girls Diastolic BP Percentile --      Boys Systolic BP Percentile --      Boys Diastolic BP Percentile --      Pulse Rate 06/24/24 0226 83     Resp 06/24/24 0226 18     Temp 06/24/24 0226 98.7 F (37.1 C)     Temp Source 06/24/24 0226 Oral     SpO2 06/24/24 0226 100 %     Weight 06/24/24 0219 158 lb (71.7 kg)  Height 06/24/24 0219 5' 8 (1.727 m)     Head Circumference --      Peak Flow --      Pain Score 06/24/24 0239 5     Pain Loc --      Pain Education --      Exclude from Growth Chart --     Most recent vital signs: Vitals:   06/24/24 0226  BP: 107/65  Pulse: 83  Resp: 18  Temp: 98.7 F (37.1 C)  SpO2: 100%    CONSTITUTIONAL: Alert, very somnolent.  Falls asleep during questioning.  Smells of EtOH. HEAD: Normocephalic, atraumatic EYES: Conjunctivae clear, pupils appear equal, sclera nonicteric ENT: normal nose; moist mucous membranes NECK: Supple, normal ROM CARD: RRR; S1 and S2 appreciated RESP: Normal chest excursion without splinting or tachypnea; breath sounds clear and equal bilaterally; no wheezes, no rhonchi, no rales, no hypoxia or respiratory distress, speaking full sentences ABD/GI: Non-distended; soft, non-tender, no rebound, no guarding, no peritoneal signs BACK: The back appears normal EXT: Normal ROM in all joints; no deformity noted, no edema SKIN: Normal color for age and race; warm; no rash on exposed skin NEURO: Moves all extremities equally, slurred speech, somnolent, no obvious facial asymmetry PSYCH: The patient's mood and manner are appropriate.   ED Results / Procedures / Treatments   LABS: (all labs ordered are listed, but only abnormal results are displayed) Labs Reviewed  LACTIC ACID, PLASMA - Abnormal; Notable for the following components:      Result Value   Lactic Acid, Venous 3.1 (*)    All other components within  normal limits  URINALYSIS, W/ REFLEX TO CULTURE (INFECTION SUSPECTED) - Abnormal; Notable for the following components:   Color, Urine STRAW (*)    APPearance CLEAR (*)    Specific Gravity, Urine 1.002 (*)    Glucose, UA >=500 (*)    All other components within normal limits  ETHANOL - Abnormal; Notable for the following components:   Alcohol, Ethyl (B) 169 (*)    All other components within normal limits  CBC WITH DIFFERENTIAL/PLATELET - Abnormal; Notable for the following components:   RBC 3.58 (*)    Hemoglobin 10.8 (*)    HCT 33.5 (*)    RDW 19.6 (*)    All other components within normal limits  COMPREHENSIVE METABOLIC PANEL WITH GFR - Abnormal; Notable for the following components:   Calcium  8.5 (*)    Total Protein 6.3 (*)    AST 14 (*)    All other components within normal limits  TROPONIN T, HIGH SENSITIVITY - Abnormal; Notable for the following components:   Troponin T High Sensitivity 35 (*)    All other components within normal limits  RESP PANEL BY RT-PCR (RSV, FLU A&B, COVID)  RVPGX2  CULTURE, BLOOD (ROUTINE X 2)  CULTURE, BLOOD (ROUTINE X 2)  URINE DRUG SCREEN  PROTIME-INR  PROCALCITONIN  PRO BRAIN NATRIURETIC PEPTIDE  LACTIC ACID, PLASMA  CBC WITH DIFFERENTIAL/PLATELET     EKG:    Date: 06/24/2024  Rate: 82  Rhythm: normal sinus rhythm  QRS Axis: normal  Intervals: normal  ST/T Wave abnormalities: normal  Conduction Disutrbances: none  Narrative Interpretation: unremarkable     RADIOLOGY: My personal review and interpretation of imaging: Chest x-ray shows residual left-sided pneumonia.  CT head unremarkable.  I have personally reviewed all radiology reports.   DG Chest Port 1 View Result Date: 06/24/2024 EXAM: 1 VIEW(S) XRAY OF THE CHEST 06/24/2024 03:53:00 AM COMPARISON: 05/31/2024  CLINICAL HISTORY: Questionable sepsis - evaluate for abnormality FINDINGS: LINES, TUBES AND DEVICES: Right chest port with tip in SVC. LUNGS AND PLEURA: Improving  left hilar opacity with superimposed linear atelectasis. Mild left basilar atelectasis. Follow-up chest radiograph is recommended in 3-4 weeks, following conservative therapy, to document resolution. If persistent at that time, dedicated contrast-enhanced CT imaging of the chest is recommended for further evaluation. No pleural effusion. No pneumothorax. HEART AND MEDIASTINUM: No acute abnormality of the cardiac and mediastinal silhouettes. BONES AND SOFT TISSUES: Remote right clavicle fracture. No acute osseous abnormality. IMPRESSION: 1. Improving left hilar opacity with superimposed linear atelectasis and mild left basilar atelectasis. Follow-up chest radiograph in 3-4 weeks is recommended to document resolution, with contrast-enhanced chest CT if findings persist. Electronically signed by: Dorethia Molt MD 06/24/2024 04:06 AM EST RP Workstation: [id number]   CT HEAD WO CONTRAST ( ) Result Date: 06/24/2024 EXAM: CT HEAD WITHOUT CONTRAST 06/24/2024 03:29:49 AM TECHNIQUE: CT of the head was performed without the administration of intravenous contrast. Automated exposure control, iterative reconstruction, and/or weight based adjustment of the mA/kV was utilized to reduce the radiation dose to as low as reasonably achievable. COMPARISON: CT Head May 31, 2024 CLINICAL HISTORY: Delirium FINDINGS: BRAIN AND VENTRICLES: No acute hemorrhage. No evidence of acute infarct. No hydrocephalus. No extra-axial collection. No mass effect or midline shift. ORBITS: No acute abnormality. SINUSES: No acute abnormality. SOFT TISSUES AND SKULL: No acute soft tissue abnormality. No skull fracture. IMPRESSION: 1. No acute intracranial abnormality. Electronically signed by: Gilmore Molt MD 06/24/2024 03:33 AM EST RP Workstation: [id number]     PROCEDURES:  Critical Care performed: Yes, see critical care procedure note(s)   CRITICAL CARE Performed by: Josette Sink   Total critical care time: 30 minutes  Critical care  time was exclusive of separately billable procedures and treating other patients.  Critical care was necessary to treat or prevent imminent or life-threatening deterioration.  Critical care was time spent personally by me on the following activities: development of treatment plan with patient and/or surrogate as well as nursing, discussions with consultants, evaluation of patient's response to treatment, examination of patient, obtaining history from patient or surrogate, ordering and performing treatments and interventions, ordering and review of laboratory studies, ordering and review of radiographic studies, pulse oximetry and re-evaluation of patient's condition.   SABRA1-3 Lead EKG Interpretation  Performed by: Abra Lingenfelter, Josette SAILOR, DO Authorized by: Shaneece Stockburger, Josette SAILOR, DO     Interpretation: normal     ECG rate:  82   ECG rate assessment: normal     Rhythm: sinus rhythm     Ectopy: none     Conduction: normal       IMPRESSION / MDM / ASSESSMENT AND PLAN / ED COURSE  I reviewed the triage vital signs and the nursing notes.    Patient here with somnolence, alcohol intoxication, orthostatic hypotension.  No family at bedside to provide any history but apparently daughter called 911.  Patient is unable to tell me why.  Was orthostatic with EMS with pressure of 70/50.  Recently admitted for pneumonia.  The patient is on the cardiac monitor to evaluate for evidence of arrhythmia and/or significant heart rate changes.   DIFFERENTIAL DIAGNOSIS (includes but not limited to):   Dehydration, alcohol intoxication, sepsis, anemia, doubt cardiogenic shock  Patient's presentation is most consistent with acute presentation with potential threat to life or bodily function.   PLAN: Will obtain labs, cultures, chest x-ray, COVID and flu swab, urine.  Will give  broad-spectrum antibiotics given recent pneumonia now with hypotension and noncompliance with outpatient antibiotic therapy.  She denies any chest  pain, shortness of breath or cough but is clearly intoxicated and very somnolent here and not a reliable historian.  Suspect somnolence due to alcohol use but will obtain head CT to rule out intracranial hemorrhage.  Does not have any obvious focal neurodeficits other than some mild slurred speech which again is likely due to intoxication.  Less likely stroke.  Unknown last known well.   MEDICATIONS GIVEN IN ED: Medications  lactated ringers  infusion (has no administration in time range)  metroNIDAZOLE  (FLAGYL ) IVPB 500 mg (500 mg Intravenous New Bag/Given 06/24/24 0436)  vancomycin  (VANCOCIN ) IVPB 1000 mg/200 mL premix (has no administration in time range)  thiamine  (VITAMIN B1) injection 100 mg (has no administration in time range)  lactated ringers  bolus 1,000 mL (1,000 mLs Intravenous New Bag/Given 06/24/24 0402)    And  lactated ringers  bolus 1,000 mL (1,000 mLs Intravenous New Bag/Given 06/24/24 0417)    And  lactated ringers  bolus 250 mL (250 mLs Intravenous New Bag/Given 06/24/24 0506)  ceFEPIme  (MAXIPIME ) 2 g in sodium chloride  0.9 % 100 mL IVPB (0 g Intravenous Stopped 06/24/24 0434)     ED COURSE: Patient's blood pressure has been stable here.  No leukocytosis but does have a lactic of 3.1.  Has had previous normal lactic's outside of this pneumonia.  Her chest x-ray was reviewed and interpreted by myself and the radiologist and shows persistent but improved left hilar pneumonia.  She is getting broad-spectrum antibiotics.  CT head shows no acute abnormality.  Urine does not appear infected.  Normal hemoglobin, electrolytes.  Have recommended admission for observation for IV antibiotics, trending of her lactate, monitoring of her blood pressure and reassessment when clinically sober.   CONSULTS:  Consulted and discussed patient's case with hospitalist, Dr. Cleatus.  I have recommended admission and consulting physician agrees and will place admission orders.  Patient (and family if present)  agree with this plan.   I reviewed all nursing notes, vitals, pertinent previous records.  All labs, EKGs, imaging ordered have been independently reviewed and interpreted by myself.    OUTSIDE RECORDS REVIEWED: Reviewed most recent admission notes.       FINAL CLINICAL IMPRESSION(S) / ED DIAGNOSES   Final diagnoses:  Orthostatic hypotension  Pneumonia of left upper lobe due to infectious organism  Alcoholic intoxication with complication  Elevated lactic acid level     Rx / DC Orders   ED Discharge Orders     None        Note:  This document was prepared using Dragon voice recognition software and may include unintentional dictation errors.   Cloa Bushong, Josette SAILOR, DO 06/24/24 [phone]

## 2024-06-24 NOTE — ED Notes (Signed)
 Pt friend to desk and advised they was leaving AMA. Primary nurse already aware. This RN removed Ivs and educated on leaving AMA and to come back if she worsened.

## 2024-06-24 NOTE — Assessment & Plan Note (Addendum)
 Possibly chronic-noted during recent past admission(required midodrine ) Suspect related to dehydration in the setting of intoxication, possibly poor oral intake Concern for possible antihypertensive related hypotension as well (diltiazem  on med list in November) Low suspicion for sepsis is not meeting criteria Continue IV fluids Holding off on antibiotics given procalcitonin less than 0.10(received broad-spectrum in the ED) Follow-up blood cultures Fall precautions

## 2024-06-24 NOTE — Discharge Instructions (Signed)

## 2024-06-24 NOTE — ED Notes (Signed)
 Pt ambulated to toilet in room without assistance. Pt denies dizziness

## 2024-06-24 NOTE — TOC Initial Note (Signed)
 Transition of Care [facility]) - Initial/Assessment Note    Patient Details  Name: Erica [name] MRN: [id number] Date of Birth: 11/22/1965  [facility] CM/SW Contact:    Ayris Carano L Raphael Espe, LCSW Phone Number: 06/24/2024, 8:19 AM  Clinical Narrative:                  [facility] consult received for substance abuse education/counseling. TOC does not provide education/counseling. Resources was uploaded to the AVS for patient to follow-up.        Patient Goals and CMS Choice            Expected Discharge Plan and Services                                              Prior Living Arrangements/Services                       Activities of Daily Living      Permission Sought/Granted                  Emotional Assessment              Admission diagnosis:  Hypotension [I95.9] Patient Active Problem List   Diagnosis Date Noted   Hypotension 06/24/2024   Alcohol intoxication 06/24/2024   Lactic acidosis 06/24/2024   Altered mental status 05/31/2024   Cholecystitis, acute 11/24/2023   Acute cholecystitis 11/23/2023   Malignant neoplasm of overlapping sites of left lung (HCC) 07/06/2023   Port-A-Cath in place 03/03/2023   Goals of care, counseling/discussion 02/04/2023   Lung nodule 01/13/2023   Rectal cancer (HCC) 12/23/2022   Encounter for screening involving social determinants of health (SDoH) 12/23/2022   COPD exacerbation (HCC) 12/23/2022   Noncompliance with medication monitoring regimen 08/11/2022   Failure to attend appointment 08/09/2022   Cellulitis and abscess of leg (Right) 04/22/2022   Elbow effusion (Right) 02/01/2022   Fall (01/17/2022) 01/18/2022   Acute pain of right knee 01/18/2022   Acute right elbow pain 01/18/2022   Acute pain of right shoulder 01/18/2022   Chronic hand pain (Bilateral) 06/15/2021   Primary osteoarthritis of left knee 02/15/2021   Chronic use of opiate for therapeutic purpose 10/26/2020    Anterolisthesis of cervical spine (C4/C5) (3 mm) 10/02/2020   Retrolisthesis of cervical spine (C5/C6) (2 mm) 10/02/2020   Shortness of breath 08/12/2020   Renal insufficiency 08/12/2020   Pulmonary HTN (HCC) 08/12/2020   Cervicalgia 07/02/2020   Numbness and tingling of upper extremity (Left) 07/02/2020   DDD (degenerative disc disease), cervical 07/02/2020   Cervical radiculopathy (sensory) (Left) 07/02/2020   Uncomplicated opioid dependence (HCC) 03/31/2020   Tricompartmental disease of knee 02/11/2020   Pharmacologic therapy 11/13/2019   Chronic low back pain (Bilateral) w/ sciatica (Bilateral) 08/20/2019   PTSD (post-traumatic stress disorder) 02/20/2019   Chronic musculoskeletal pain 02/20/2019   DDD (degenerative disc disease), lumbosacral 09/19/2018   Osteoarthritis of facet joint of lumbar spine 09/19/2018   Hypokalemia 09/10/2018   COPD (chronic obstructive pulmonary disease) with acute bronchitis (HCC) 08/13/2018   Transaminitis 08/13/2018   Acute pancreatitis 08/13/2018   Elevated LFTs 08/13/2018   Peptic ulcer disease 08/13/2018   COPD (chronic obstructive pulmonary disease) (HCC) 08/13/2018   Mass of chin 05/24/2018   Obesity (BMI 30.0-34.9) 12/05/2017   Primary localized osteoarthrosis, pelvic region and thigh  12/05/2017   Arthropathy of left hip 11/08/2017   Rib pain on right side 11/08/2017   Rectal bleeding 10/17/2017   Urinary retention 10/17/2017   Nicotine  dependence, unspecified, uncomplicated 10/17/2017   Spondylosis without myelopathy or radiculopathy, lumbosacral region 10/06/2017   Dizziness 09/21/2017   Vertigo 09/15/2017   Controlled type 2 diabetes mellitus without complication, without long-term current use of insulin  (HCC) 07/18/2017   HTN (hypertension) 07/18/2017   Chronic hip pain (2ry area of Pain) (Bilateral) (L>R) 05/02/2017   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) 03/15/2017   Skin lesion 11/25/2016   Hemarthrosis, left knee 11/10/2016    Dyspnea 09/20/2016   Osteoarthritis of knee (Bilateral) (L>R) 09/08/2016   Intermittent left thoracic Muscle cramps 09/08/2016   Spasm of thoracolumbar muscle (Left) 09/08/2016   Post-traumatic osteoarthritis of knee (Left) 09/08/2016   Lumbar spondylosis 07/05/2016   Chronic pain syndrome 06/28/2016   Elevated sedimentation rate 06/28/2016   Elevated C-reactive protein (CRP) 06/28/2016   Neurogenic pain 06/28/2016   Vitamin D  deficiency 06/28/2016   Long term (current) use of opiate analgesic 04/29/2016   Long term prescription opiate use 04/29/2016   Opiate use 04/29/2016   Chronic low back pain (1ry area of Pain) (Bilateral) (L>R) w/o sciatica 04/29/2016   Chronic knee pain (3ry area of Pain) (Bilateral) (L>R) 04/29/2016   Chronic neck pain (Bilateral) (R>L) 04/29/2016   Chronic upper back pain (midline) 04/29/2016   Chronic foot pain (bottom of feet) (Bilateral) (R>L) 04/29/2016   Peripheral neuropathy, idiopathic (upper and lower extremity) 04/29/2016   Chronic sacroiliac joint pain (Bilateral) (R>L) 04/29/2016   Lumbar facet syndrome (Bilateral) (R>L) 04/29/2016   Thrush, oral 03/08/2016   Depression 03/05/2016   Insomnia, unspecified 07/31/2015   Neuropathy 07/31/2015   Other specified dorsopathies, site unspecified 07/31/2015   Chronic diastolic heart failure (HCC) 07/16/2015   Tobacco use disorder 07/16/2015   Obstructive sleep apnea 07/16/2015   Tachycardia 07/16/2015   Congestive heart failure (HCC) 06/30/2015   COPD, mild (HCC)    Chest pain 06/14/2015   Hyponatremia 06/14/2015   Swelling    Cough    Rosacea 09/01/2011   PCP:  Tobie Domino, MD Pharmacy:   [facility] [id number] [place],  - [address] AT [facility] [address] [place] [address] Phone: [phone] Fax: [phone]     Social Drivers of Health (SDOH) Social History: SDOH Screenings   Food Insecurity: No Food Insecurity (05/31/2024)  Housing: High  Risk (05/31/2024)  Transportation Needs: No Transportation Needs (06/01/2024)  Utilities: At Risk (05/31/2024)  Depression (PHQ2-9): Medium Risk (02/17/2023)  Financial Resource Strain: High Risk (07/18/2017)  Physical Activity: Insufficiently Active (07/18/2017)  Social Connections: Unknown (05/31/2024)  Stress: Stress Concern Present (07/18/2017)  Tobacco Use: High Risk (06/24/2024)   SDOH Interventions:     Readmission Risk Interventions     No data to display

## 2024-06-24 NOTE — ED Notes (Signed)
 Pt spoke to provider and has decided they want to leave AMA. This RN witnessed pt's signature on AMA form.

## 2024-06-24 NOTE — Assessment & Plan Note (Signed)
Resume home meds pending med rec 

## 2024-06-24 NOTE — Assessment & Plan Note (Signed)
 Nicotine  patch.

## 2024-06-24 NOTE — ED Notes (Signed)
 Unable to start SEPSIS bundle due to pt being OTF. Will start once PT returns.

## 2024-06-24 NOTE — ED Notes (Signed)
 Pt up to bathroom without difficulty.

## 2024-06-24 NOTE — Sepsis Progress Note (Signed)
 Elink following code sepsis

## 2024-06-24 NOTE — Assessment & Plan Note (Signed)
Protonix as needed. °

## 2024-06-24 NOTE — Sepsis Progress Note (Signed)
 Notified bedside nurse of need to draw repeat lactic acid.

## 2024-06-29 LAB — CULTURE, BLOOD (ROUTINE X 2)
Culture: NO GROWTH
Culture: NO GROWTH

## 2024-07-09 ENCOUNTER — Inpatient Hospital Stay: Admitting: Hospice and Palliative Medicine

## 2024-07-09 ENCOUNTER — Other Ambulatory Visit: Payer: Self-pay

## 2024-07-09 ENCOUNTER — Inpatient Hospital Stay: Admitting: Oncology

## 2024-07-09 ENCOUNTER — Encounter: Payer: Self-pay | Admitting: Hospice and Palliative Medicine

## 2024-07-09 ENCOUNTER — Inpatient Hospital Stay

## 2024-07-09 ENCOUNTER — Encounter: Payer: Self-pay | Admitting: Oncology

## 2024-07-09 VITALS — BP 149/80 | HR 88 | Temp 98.3°F | Resp 18 | Wt 156.0 lb

## 2024-07-09 VITALS — BP 160/93 | HR 88 | Temp 98.3°F | Resp 18 | Wt 156.0 lb

## 2024-07-09 DIAGNOSIS — C20 Malignant neoplasm of rectum: Secondary | ICD-10-CM | POA: Diagnosis not present

## 2024-07-09 DIAGNOSIS — Z515 Encounter for palliative care: Secondary | ICD-10-CM | POA: Diagnosis not present

## 2024-07-09 DIAGNOSIS — F17210 Nicotine dependence, cigarettes, uncomplicated: Secondary | ICD-10-CM | POA: Diagnosis not present

## 2024-07-09 DIAGNOSIS — G893 Neoplasm related pain (acute) (chronic): Secondary | ICD-10-CM

## 2024-07-09 DIAGNOSIS — Z08 Encounter for follow-up examination after completed treatment for malignant neoplasm: Secondary | ICD-10-CM | POA: Diagnosis present

## 2024-07-09 DIAGNOSIS — F17200 Nicotine dependence, unspecified, uncomplicated: Secondary | ICD-10-CM | POA: Diagnosis not present

## 2024-07-09 DIAGNOSIS — C3482 Malignant neoplasm of overlapping sites of left bronchus and lung: Secondary | ICD-10-CM

## 2024-07-09 DIAGNOSIS — R530 Neoplastic (malignant) related fatigue: Secondary | ICD-10-CM | POA: Diagnosis not present

## 2024-07-09 DIAGNOSIS — F1190 Opioid use, unspecified, uncomplicated: Secondary | ICD-10-CM | POA: Diagnosis not present

## 2024-07-09 DIAGNOSIS — Z85048 Personal history of other malignant neoplasm of rectum, rectosigmoid junction, and anus: Secondary | ICD-10-CM | POA: Diagnosis present

## 2024-07-09 DIAGNOSIS — F419 Anxiety disorder, unspecified: Secondary | ICD-10-CM | POA: Diagnosis not present

## 2024-07-09 DIAGNOSIS — Z79899 Other long term (current) drug therapy: Secondary | ICD-10-CM | POA: Diagnosis not present

## 2024-07-09 DIAGNOSIS — M792 Neuralgia and neuritis, unspecified: Secondary | ICD-10-CM | POA: Diagnosis not present

## 2024-07-09 DIAGNOSIS — G894 Chronic pain syndrome: Secondary | ICD-10-CM

## 2024-07-09 LAB — CBC WITH DIFFERENTIAL (CANCER CENTER ONLY)
Abs Immature Granulocytes: 0.01 K/uL (ref 0.00–0.07)
Basophils Absolute: 0.1 K/uL (ref 0.0–0.1)
Basophils Relative: 1 %
Eosinophils Absolute: 0.2 K/uL (ref 0.0–0.5)
Eosinophils Relative: 3 %
HCT: 35.6 % — ABNORMAL LOW (ref 36.0–46.0)
Hemoglobin: 12 g/dL (ref 12.0–15.0)
Immature Granulocytes: 0 %
Lymphocytes Relative: 20 %
Lymphs Abs: 1 K/uL (ref 0.7–4.0)
MCH: 31 pg (ref 26.0–34.0)
MCHC: 33.7 g/dL (ref 30.0–36.0)
MCV: 92 fL (ref 80.0–100.0)
Monocytes Absolute: 0.6 K/uL (ref 0.1–1.0)
Monocytes Relative: 11 %
Neutro Abs: 3.3 K/uL (ref 1.7–7.7)
Neutrophils Relative %: 65 %
Platelet Count: 252 K/uL (ref 150–400)
RBC: 3.87 MIL/uL (ref 3.87–5.11)
RDW: 18.6 % — ABNORMAL HIGH (ref 11.5–15.5)
WBC Count: 5.1 K/uL (ref 4.0–10.5)
nRBC: 0 % (ref 0.0–0.2)

## 2024-07-09 LAB — URINE DRUG SCREEN
Amphetamines: NEGATIVE
Barbiturates: NEGATIVE
Benzodiazepines: NEGATIVE
Cocaine: NEGATIVE
Fentanyl: NEGATIVE
Methadone Scn, Ur: NEGATIVE
Opiates: NEGATIVE
Tetrahydrocannabinol: NEGATIVE

## 2024-07-09 LAB — CMP (CANCER CENTER ONLY)
ALT: 7 U/L (ref 0–44)
AST: 15 U/L (ref 15–41)
Albumin: 4.2 g/dL (ref 3.5–5.0)
Alkaline Phosphatase: 86 U/L (ref 38–126)
Anion gap: 12 (ref 5–15)
BUN: 8 mg/dL (ref 6–20)
CO2: 25 mmol/L (ref 22–32)
Calcium: 9.6 mg/dL (ref 8.9–10.3)
Chloride: 101 mmol/L (ref 98–111)
Creatinine: 0.73 mg/dL (ref 0.44–1.00)
GFR, Estimated: >60 mL/min
Glucose, Bld: 102 mg/dL — ABNORMAL HIGH (ref 70–99)
Potassium: 3.8 mmol/L (ref 3.5–5.1)
Sodium: 138 mmol/L (ref 135–145)
Total Bilirubin: 0.5 mg/dL (ref 0.0–1.2)
Total Protein: 7.5 g/dL (ref 6.5–8.1)

## 2024-07-09 LAB — GENETIC SCREENING ORDER

## 2024-07-09 MED ORDER — PREGABALIN 100 MG PO CAPS: 100.0000 mg | ORAL_CAPSULE | Freq: Two times a day (BID) | ORAL | 0 refills | Status: DC

## 2024-07-09 MED ORDER — OXYCODONE HCL 15 MG PO TABS: 15.0000 mg | ORAL_TABLET | Freq: Two times a day (BID) | ORAL | 0 refills | Status: DC | PRN

## 2024-07-09 MED ORDER — CYCLOBENZAPRINE HCL 5 MG PO TABS: 5.0000 mg | ORAL_TABLET | Freq: Two times a day (BID) | ORAL | 0 refills | Status: DC | PRN

## 2024-07-09 NOTE — Progress Notes (Signed)
 "    Palliative Medicine [facility] at [facility] [phone] [phone]   Name: Erica [name] Date: 07/09/2024 MRN: [id number]  DOB: Dec 15, 1965  Patient Care Team: Tobie Domino, MD as PCP - General (Family Medicine) Perla, Evalene PARAS, MD as PCP - Cardiology (Cardiology) Donette Ellouise DELENA, FNP as Nurse Practitioner (Family Medicine) Perla Evalene PARAS, MD as Consulting Physician (Cardiology) Linard Alm NOVAK, MD (Inactive) as Consulting Physician (Pulmonary Disease) Cindie Jesusa HERO, RN as Registered Nurse Dannielle Arlean FALCON, RN (Inactive) as Registered Nurse Maurie Rayfield BIRCH, RN as Oncology Nurse Navigator Lanny Callander, MD as Consulting Physician (Oncology) Pickenpack-Cousar, Fannie SAILOR, NP as Nurse Practitioner (Hospice and Palliative Medicine) Dewey Rush, MD as Consulting Physician (Radiation Oncology) Therisa Bi, MD as Consulting Physician (Gastroenterology) Isadora Hose, MD as Consulting Physician (Pulmonary Disease) Babara Call, MD as Consulting Physician (Oncology)    REASON FOR CONSULTATION: JALEXIA [name] is a 58 y.o. 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  Palliative care was consulted to address goals of manage ongoing symptoms.  SOCIAL HISTORY:     reports that she has been smoking cigarettes. She has a 138 pack-year smoking history. She has never used smokeless tobacco. She reports current alcohol use. She reports that she does not use drugs.  Patient is twice divorced.  Lives in a camper on her friend's property.  Has a daughter in [place] and another daughter in [place], [place] .  Patient owned a bar for 25 years.  She is admittedly a recovering alcoholic.  Denies any other illicit drugs.  ADVANCE DIRECTIVES:   Not on file.  Patient reports that her friend is her HCPOA.  CODE STATUS: DNR/DNI (DNR order signed on 07/07/2024)  PAST MEDICAL HISTORY: Past Medical History:  Diagnosis Date   (HFpEF) heart failure with preserved ejection fraction (HCC)    a. 05/2015 Echo: EF 60-65%, no rwma, PASP .   Acute pancreatitis 08/13/2018   Anxiety    Arthritis    Asthma    Bell's palsy    no deficit   Bronchitis    Cancer (HCC) 12/2022   rectal cancer   CHF (congestive heart failure) (HCC)    COPD (chronic obstructive pulmonary disease) (HCC)    Depression    Diabetes mellitus without complication (HCC)    type II 03/2017   Dyspnea    Fatty liver    per patient   Gall stones    per patient   Gastric ulcer    Hyperlipidemia    Hypertension    Lung nodule 12/2022   upper left   OSA (obstructive sleep apnea)    a. did not tolerate CPAP. On oxygen 2L via [place]   Pancreatitis    07/2018   Shoulder injury    6/19   Smoker     PAST SURGICAL HISTORY:  Past Surgical History:  Procedure Laterality Date   BRONCHIAL NEEDLE ASPIRATION BIOPSY  01/27/2023   Procedure: BRONCHIAL NEEDLE ASPIRATION BIOPSIES;  Surgeon: Isadora Hose, MD;  Location: [facility] ENDOSCOPY;  Service: Pulmonary;;   COLONOSCOPY WITH PROPOFOL  N/A 12/15/2022   Procedure: COLONOSCOPY WITH PROPOFOL ;  Surgeon: Therisa Bi, MD;  Location: [facility] ENDOSCOPY;  Service: Gastroenterology;  Laterality: N/A;   COLONOSCOPY WITH PROPOFOL  N/A 12/16/2022   Procedure: COLONOSCOPY WITH PROPOFOL ;  Surgeon:  Therisa Bi, MD;  Location: [facility] ENDOSCOPY;  Service: Gastroenterology;  Laterality: N/A;   FLEXIBLE BRONCHOSCOPY N/A 06/20/2015   Procedure: FLEXIBLE BRONCHOSCOPY;  Surgeon: Laurance Flor, MD;  Location: [facility] ORS;  Service: Pulmonary;  Laterality: N/A;   IR EXCHANGE BILIARY DRAIN  01/19/2024   IR IMAGING GUIDED PORT INSERTION  02/25/2023   IR PERC CHOLECYSTOSTOMY  11/25/2023   IR REMOVAL BILIARY DRAIN  02/02/2024   KNEE SURGERY Left    8 knee  surgeries    HEMATOLOGY/ONCOLOGY HISTORY:  Oncology History Overview Note   Cancer Staging  Rectal cancer [facility]) Staging form: Colon and Rectum, AJCC 8th Edition - Clinical stage from 12/23/2022: Stage IVA (cT3, cN2, pM1a) - Signed by Babara Call, MD on 02/04/2023 Stage prefix: Initial diagnosis     Rectal cancer (HCC)  12/15/2022 Pathology Results   Colonoscopy:   Positive for invasive adenocarcinoma, G1 well differentiated      12/15/2022 Procedure   Colonoscopy under the care of Dr. Bi   Findings: -The perianal and digital rectal examinations were normal. -Extensive amounts of liquid semi- liquid semi- solid stool was found in the entire colon -A partially obstructing large mass was found in the rectum. The mass was circumferential.   12/23/2022 Initial Diagnosis   Rectal cancer [facility]) Patient was seen by gastroenterology due to hematochezia as well as unintentional weight loss.  12/15/2022, colonoscopy showed fungating partially obstructing large mass in the rectum, circumferential, mass extended from about 15 cm from anus to about 8 cm from anal verge.  Biopsy was taken. Poor preparation of colon.  Colonoscopy was repeated on 12/16/2022, preparations inadequate.  Pathology showed invasive adenocarcinoma.   12/23/2022 Cancer Staging   Staging form: Colon and Rectum, AJCC 8th Edition - Clinical stage from 12/23/2022: Stage Unknown (cTX, cNX) - Signed by Babara Call, MD on 12/23/2022 Stage prefix: Initial diagnosis  Staging form: Colon and Rectum, AJCC 8th Edition - Clinical stage from 12/23/2022: Stage IVA (cT3, cN2, pM1a) - Signed by Babara Call, MD on 02/04/2023 Stage prefix: Initial diagnosis   12/23/2022 Tumor Marker   Patient's tumor was tested for the following markers: CEA. Results of the tumor marker test revealed elevated at 18.1.   01/03/2023 Imaging   CT chest abdomen with contrast showed 2.2 cm left upper lobe nodule, suspicious for metastasis. No findings suspicious for  metastatic disease in the abdomen. Bilateral adrenal adenomas, benign. Cholelithiasis, without associated inflammatory changes. Aortic Atherosclerosis (ICD10-I70.0) and Emphysema   01/08/2023 Imaging   MRI pelvis without contrast-rectal protocol showed 9.2 cm concentric mid/lower rectal mass, corresponding to the patient's known primary rectal adenocarcinoma, as above. Rectal adenocarcinoma T stage: T3c Rectal adenocarcinoma N stage:  N2 Distance from tumor to the internal anal sphincter is 3.4 cm.   01/27/2023 Procedure   Patient underwent biopsy via bronchoscopy by pulmonology. Biopsy pathology showed adenocarcinoma, Immunohistochemistry is positive with cytokeratin 20 and CDX2 and negative with cytokeratin 7, TTF-1 and napsin A consistent with colonic adenocarcinoma    01/27/2023 Pathology Results   CASE: [phone]   FINAL MICROSCOPIC DIAGNOSIS:  A. LUNG, LUL, FINE NEEDLE ASPIRATION:  Adenocarcinoma  See comment   ADDENDUM:  Immunohistochemistry is positive with cytokeratin 20 and CDX2  and negative with cytokeratin 7, TTF-1 and napsin A consistent with  colonic adenocarcinoma.    02/28/2023 Miscellaneous      03/03/2023 - 07/06/2023 Chemotherapy   Patient is on Treatment Plan : COLORECTAL FOLFIRI q14d     05/13/2023 Imaging   CT chest, abdomen, and  pelvis with contrast IMPRESSION: 1. Response to therapy of rectal primary, pelvic nodal, and isolated pulmonary metastasis. 2. Moderate amount of stool upstream from the rectal primary. Partial obstruction cannot be excluded. 3. No new or progressive disease. 4. Right Port-A-Cath with anterior SVC pericatheter small volume thrombus. 5. Age advanced coronary artery atherosclerosis. Recommend assessment of coronary risk factors. 6. Incidental findings, including: Cholelithiasis. Aortic Atherosclerosis (ICD10-I70.0).     ALLERGIES:  is allergic to tramadol and penicillins.  MEDICATIONS:  Current Outpatient Medications   Medication Sig Dispense Refill   Accu-Chek FastClix Lancets MISC Apply topically 2 (two) times daily.     ACCU-CHEK GUIDE TEST test strip 2 (two) times daily.     albuterol  (VENTOLIN  HFA) 108 (90 Base) MCG/ACT inhaler Inhale 2 puffs into the lungs every 6 (six) hours as needed for wheezing or shortness of breath.      busPIRone  (BUSPAR ) 30 MG tablet Take 30 mg by mouth 2 (two) times daily.     clonazePAM  (KLONOPIN ) 0.5 MG tablet Take 0.5 mg by mouth 2 (two) times daily.     cyclobenzaprine  (FLEXERIL ) 10 MG tablet Take 1 tablet (10 mg total) by mouth 3 (three) times daily as needed for muscle spasms. 90 tablet 2   diltiazem  (CARDIZEM  CD) 120 MG 24 hr capsule TAKE ONE CAPSULE BY MOUTH EVERY DAY 60 capsule 0   diltiazem  (CARDIZEM ) 30 MG tablet TAKE 1 TABLET(30 MG) BY MOUTH THREE TIMES DAILY AS NEEDED 270 tablet 0   DULoxetine  (CYMBALTA ) 60 MG capsule Take 60 mg by mouth 2 (two) times daily.      Fluticasone -Salmeterol (ADVAIR) 250-50 MCG/DOSE AEPB Inhale 1 puff into the lungs 2 (two) times daily.     furosemide  (LASIX ) 40 MG tablet TAKE 1 TABLET BY MOUTH EVERY DAY. MAY TAKE ADDITIONAL TABLET IN THE AM FOR WEIGHT GAIN AS NEEDED 110 tablet 2   JARDIANCE 25 MG TABS tablet Take 25 mg by mouth daily.     metFORMIN  (GLUCOPHAGE -XR) 500 MG 24 hr tablet Take 1,000 mg by mouth 2 (two) times daily.     naloxone  (NARCAN ) nasal spray 4 mg/0.1 mL Place 1 spray into the nose once for 1 dose. 1 each 0   naloxone  (NARCAN ) nasal spray 4 mg/0.1 mL Place 1 spray into the nose once.     oxyCODONE  10 MG TABS Take 1.5 tablets (15 mg total) by mouth every 12 (twelve) hours as needed for pain. 30 tablet 0   OXYGEN Inhale 2 L into the lungs at bedtime. Or as needed throughout the day if short of breath     pantoprazole  (PROTONIX ) 40 MG tablet Take 1 tablet (40 mg total) by mouth daily. 30 tablet 0   polyethylene glycol (MIRALAX  / GLYCOLAX ) 17 g packet Take 17 g by mouth daily as needed for mild constipation or moderate  constipation. 30 each 0   potassium chloride  SA (KLOR-CON  M) 20 MEQ tablet Take 1 tablet (20 mEq total) by mouth 2 (two) times daily. 10 tablet 0   pregabalin  (LYRICA ) 100 MG capsule Take 1 capsule (100 mg total) by mouth 2 (two) times daily. 30 capsule 0   senna-docusate (SENOKOT-S) 8.6-50 MG tablet Take 1 tablet by mouth 2 (two) times daily. For constipation 60 tablet 0   spironolactone  (ALDACTONE ) 25 MG tablet TAKE 1 TABLET(25 MG) BY MOUTH EVERY MORNING 90 tablet 3   No current facility-administered medications for this visit.    VITAL SIGNS: BP (!) 149/80 (BP Location: Right Arm, Patient Position:  Sitting)   Pulse 88   Temp 98.3 F (36.8 C) (Tympanic)   Resp 18   Wt 156 lb (70.8 kg)   LMP 03/13/2015 Comment: more than 2 years ago  SpO2 100%   BMI 23.72 kg/m  Filed Weights   07/09/24 1407  Weight: 156 lb (70.8 kg)    Estimated body mass index is 23.72 kg/m as calculated from the following:   Height as of 06/24/24: 5' 8 (1.727 m).   Weight as of this encounter: 156 lb (70.8 kg).  LABS: CBC:    Component Value Date/Time   WBC 4.7 06/24/2024 0228   HGB 10.8 (L) 06/24/2024 0228   HGB 13.3 07/28/2023 1240   HGB 13.9 12/09/2022 1515   HCT 33.5 (L) 06/24/2024 0228   HCT 41.4 12/09/2022 1515   PLT 197 06/24/2024 0228   PLT 156 07/28/2023 1240   PLT 288 12/09/2022 1515   MCV 93.6 06/24/2024 0228   MCV 91 12/09/2022 1515   MCV 102 (H) 02/26/2014 1826   NEUTROABS 2.4 06/24/2024 0228   NEUTROABS 5.6 12/09/2022 1515   LYMPHSABS 1.3 06/24/2024 0228   LYMPHSABS 2.4 12/09/2022 1515   MONOABS 0.5 06/24/2024 0228   EOSABS 0.4 06/24/2024 0228   EOSABS 0.5 (H) 12/09/2022 1515   BASOSABS 0.1 06/24/2024 0228   BASOSABS 0.1 12/09/2022 1515   Comprehensive Metabolic Panel:    Component Value Date/Time   NA 139 06/24/2024 0228   NA 140 04/22/2016 1532   NA 139 02/26/2014 1826   K 3.9 06/24/2024 0228   K 3.7 02/26/2014 1826   CL 102 06/24/2024 0228   CL 104 02/26/2014 1826    CO2 24 06/24/2024 0228   CO2 27 02/26/2014 1826   BUN 10 06/24/2024 0228   BUN 10 04/22/2016 1532   BUN 4 (L) 02/26/2014 1826   CREATININE 0.80 06/24/2024 0228   CREATININE 0.68 07/28/2023 1240   CREATININE 0.74 02/26/2014 1826   GLUCOSE 91 06/24/2024 0228   GLUCOSE 132 (H) 02/26/2014 1826   CALCIUM  8.5 (L) 06/24/2024 0228   CALCIUM  8.3 (L) 02/26/2014 1826   AST 14 (L) 06/24/2024 0228   AST 22 07/28/2023 1240   ALT 8 06/24/2024 0228   ALT 23 07/28/2023 1240   ALT 57 03/10/2013 0100   ALKPHOS 93 06/24/2024 0228   ALKPHOS 89 03/10/2013 0100   BILITOT <0.2 06/24/2024 0228   BILITOT 0.5 07/28/2023 1240   PROT 6.3 (L) 06/24/2024 0228   PROT 7.6 03/10/2013 0100   ALBUMIN 3.6 06/24/2024 0228   ALBUMIN 3.6 03/10/2013 0100    RADIOGRAPHIC STUDIES: DG Chest Port 1 View Result Date: 06/24/2024 EXAM: 1 VIEW(S) XRAY OF THE CHEST 06/24/2024 03:53:00 AM COMPARISON: 05/31/2024 CLINICAL HISTORY: Questionable sepsis - evaluate for abnormality FINDINGS: LINES, TUBES AND DEVICES: Right chest port with tip in SVC. LUNGS AND PLEURA: Improving left hilar opacity with superimposed linear atelectasis. Mild left basilar atelectasis. Follow-up chest radiograph is recommended in 3-4 weeks, following conservative therapy, to document resolution. If persistent at that time, dedicated contrast-enhanced CT imaging of the chest is recommended for further evaluation. No pleural effusion. No pneumothorax. HEART AND MEDIASTINUM: No acute abnormality of the cardiac and mediastinal silhouettes. BONES AND SOFT TISSUES: Remote right clavicle fracture. No acute osseous abnormality. IMPRESSION: 1. Improving left hilar opacity with superimposed linear atelectasis and mild left basilar atelectasis. Follow-up chest radiograph in 3-4 weeks is recommended to document resolution, with contrast-enhanced chest CT if findings persist. Electronically signed by: Dorethia Molt MD 06/24/2024  04:06 AM EST RP Workstation: [id number]   CT  HEAD WO CONTRAST ( ) Result Date: 06/24/2024 EXAM: CT HEAD WITHOUT CONTRAST 06/24/2024 03:29:49 AM TECHNIQUE: CT of the head was performed without the administration of intravenous contrast. Automated exposure control, iterative reconstruction, and/or weight based adjustment of the mA/kV was utilized to reduce the radiation dose to as low as reasonably achievable. COMPARISON: CT Head May 31, 2024 CLINICAL HISTORY: Delirium FINDINGS: BRAIN AND VENTRICLES: No acute hemorrhage. No evidence of acute infarct. No hydrocephalus. No extra-axial collection. No mass effect or midline shift. ORBITS: No acute abnormality. SINUSES: No acute abnormality. SOFT TISSUES AND SKULL: No acute soft tissue abnormality. No skull fracture. IMPRESSION: 1. No acute intracranial abnormality. Electronically signed by: Gilmore Molt MD 06/24/2024 03:33 AM EST RP Workstation: [id number]    PERFORMANCE STATUS (ECOG) : 1 - Symptomatic but completely ambulatory  Review of Systems Unless otherwise noted, a complete review of systems is negative.  Physical Exam General: NAD Cardiovascular: regular rate and rhythm Pulmonary: clear ant fields Abdomen: soft, nontender, + bowel sounds GU: no suprapubic tenderness Extremities: no edema, no joint deformities Skin: no rashes Neurological: Weakness but otherwise nonfocal  IMPRESSION: Patient with stage IV colon cancer status post chemoradiation.  Patient opted not to pursue surgery as she did not want a colostomy bag.  High risk for eventual recurrence.  Plan is for surveillance.  She was hospitalized 05/21/2024 to 06/04/2024 after EMS found patient unresponsive with agonal respirations while providing a wellness check.  Patient was administered naloxone  with improvement in symptoms.  Patient apparently was also drinking alcohol with use of benzodiazepines and opioids.  She was felt to have an unintentional overdose and polypharmacy.  Patient was hospitalized again 06/24/2024 after  her daughter called EMS with report of hypotension and orthostasis.  Apparently, patient was intoxicated upon presentation to the emergency department.  Ethanol level 169.  Patient was given IV fluids and started on antibiotics and admitted for hypotension but ultimately left AMA on same day.  Patient has chronic opioid use with chronic pain syndrome (back and joint pain).  She was previously managed by the [facility] pain clinic (Dr. Tanya).  Pain worsened when she was diagnosed with cancer and she has been most recently managed by palliative care at [facility].  Per chart review, patient was previously on long-acting opioids years ago.  She has had a variety of interventional procedures via pain clinic.  Most recently, she has been oxycodone , cyclobenzaprine , and pregabalin .  Patient was previously taking oxycodone  15 mg 3 times daily and dose was subsequently reduced to 15 mg 2 times daily after her recent hospitalization.  Her pregabalin  was also decreased to 100 mg twice daily.  Patient reports pain is stable on this regimen.  She denies any adverse effects from pain medications.  Denies any illicit drug use.  Patient does admit to occasional alcohol use and is trying to maintain sobriety.  Patient also on clonazepam  0.5 mg twice daily and Ambien 10 mg as needed at bedtime as prescribed by psychiatry - Mliss Parsley, PA.   PDMP reviewed.  Oxycodone  last dispensed on 06/21/24 total of 30 tablets for 10-day supply.  Had a long conversation with patient regarding my concerns for her periodic alcohol use in addition to multiple sedating medications.  She verbalized understanding that this could potentially lead to medical complications such as hospitalization, falls or death.  Patient promises sobriety and plans to follow-up with her psychiatrist.  Will also reach out to  her psychiatrist.  Patient reports that pain has been stable after reduced frequency of oxycodone  to twice daily and reduced  dose of pregabalin .  Will continue on this regimen and wean as tolerated.  Will also reduce dose of cyclobenzaprine  to 5 mg twice daily.  Patient request refill of oxycodone , cyclobenzaprine , and pregabalin  today. Patient says she has been out of all of her medications recently and has not had money to get them filled.   Patient says that she has previously completed ACP documents and appointed her friend on whose property she lives to be her healthcare power of attorney.  Patient says that she would not want to be resuscitated nor have her life prolonged artificially on machines.  She says she would only be okay with intubation in the setting of short-term if required for procedure.  She requested DNR order today which was signed for her to take home.  PLAN: - Patient establishing care with Dr. Babara - Refill oxycodone  15 mg every 12 hours #60 - Decrease dose of cyclobenzaprine  to 5 mg twice daily - Refill pregabalin  - Daily bowel regimen - UDS - PDMP reviewed - Opioid use contract completed - Patient has naloxone  at home - Patient to follow-up with her psychiatric PA - Will reach out to [facility], NP to discuss care - DNR - Follow-up 3 weeks  Case and plan discussed with Dr. Babara  Patient expressed understanding and was in agreement with this plan. She also understands that She can call the clinic at any time with any questions, concerns, or complaints.     Time Total: 45 minutes  Visit consisted of counseling and education dealing with the complex and emotionally intense issues of symptom management and palliative care in the setting of serious and potentially life-threatening illness.Greater than 50%  of this time was spent counseling and coordinating care related to the above assessment and plan.  Signed by: Fonda Mower, PhD, NP-C   "

## 2024-07-09 NOTE — Assessment & Plan Note (Addendum)
 has chronic opioid use with chronic pain syndrome (back and joint pain).  Follow up with palliative care.  There are concerns for her periodic alcohol use in addition to multiple sedating medications.

## 2024-07-09 NOTE — Assessment & Plan Note (Signed)
 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  [id number], stage IV,with oligo lung metastasis, intact MMR, KRAS G12V (+)  S/p neoadjuvant chemotherapy followed by concurrent chemotherapy+ RT.  Declined surgery.   Check cbc cmp CEA, signatera cDNA.  Check CT chest abdomen pelvis w contrast.

## 2024-07-09 NOTE — Progress Notes (Signed)
 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  [id number], stage IV,with oligo lung metastasis, intact MMR, KRAS G12V (+)  S/p neoadjuvant chemotherapy followed by concurrent chemotherapy+ RT.  Declined surgery.   Check cbc cmp CEA, signatera cDNA.  Check CT chest abdomen pelvis w contrast.    Tobacco use disorder Smoke cessation was discussed with patient.  Chronic pain syndrome has chronic opioid use with chronic pain syndrome (back and joint pain).  Follow up with palliative care.  There are concerns for her periodic alcohol use in addition to multiple sedating medications.     Orders Placed This Encounter  Procedures   CT CHEST ABDOMEN PELVIS W CONTRAST    Standing Status:   Future    Expected Date:   07/16/2024    Expiration Date:   07/09/2025    If indicated for the ordered procedure, I authorize the administration of contrast media per Radiology protocol:   Yes    Does the patient have a contrast media/X-ray dye allergy?:   No    Preferred imaging location?:   [facility]    If indicated for the ordered procedure, I authorize the administration of oral contrast media per Radiology protocol:   Yes   CBC with Differential (Cancer Center Only)    Standing Status:   Future    Number of Occurrences:   1    Expected Date:   07/09/2024    Expiration Date:   10/07/2024   CMP (Cancer Center only)    Standing Status:   Future    Number of Occurrences:   1    Expected Date:   07/09/2024    Expiration Date:   10/07/2024   CEA    Standing Status:   Future    Number of Occurrences:   1    Expected Date:   07/09/2024    Expiration Date:   10/07/2024   Genetic Screening Order    Standing Status:   Future    Number  of Occurrences:   1    Expected Date:   07/09/2024    Expiration Date:   10/07/2024   CBC with Differential (Cancer Center Only)    Standing Status:   Future    Expected Date:   10/07/2024    Expiration Date:   01/05/2025   CEA    Standing Status:   Future    Expected Date:   10/07/2024    Expiration Date:   01/05/2025   CMP (Cancer Center only)    Standing Status:   Future    Expected Date:   10/07/2024    Expiration Date:   01/05/2025   Genetic Screening Order    Standing Status:   Future    Expected Date:   10/07/2024    Expiration Date:   01/05/2025   Ambulatory referral to Social Work    Referral Priority:   Routine    Referral Type:   Consultation    Referral Reason:   Specialty Services Required    Number of Visits Requested:   1   Follow up with 3 months All questions were answered. The patient knows to call the clinic with any problems, questions or concerns.  Zelphia Cap, MD, PhD [facility] Hematology Oncology  07/09/2024    HISTORY OF PRESENTING ILLNESS:  Erica [name] 58 y.o. female presents to establish care for rectal cancer I have reviewed her chart and materials related to her cancer extensively and collaborated history with the patient. Summary of oncologic history is as follows: Oncology History Overview Note   Cancer Staging  Rectal cancer [facility]) Staging form: Colon and Rectum, AJCC 8th Edition - Clinical stage from 12/23/2022: Stage IVA (cT3, cN2, pM1a) - Signed by Babara Call, MD on 02/04/2023 Stage prefix: Initial diagnosis     Rectal cancer (HCC)  12/15/2022 Pathology Results   Colonoscopy:   Positive for invasive adenocarcinoma, G1 well differentiated      12/15/2022 Procedure   Colonoscopy under the care of Dr. Ruel   Findings: -The perianal and digital rectal examinations were normal. -Extensive amounts of liquid semi- liquid semi- solid stool was found in the entire colon -A partially obstructing large mass was found in the rectum. The mass was  circumferential.   12/23/2022 Initial Diagnosis   Rectal cancer [facility]) Patient was seen by gastroenterology due to hematochezia as well as unintentional weight loss.  12/15/2022, colonoscopy showed fungating partially obstructing large mass in the rectum, circumferential, mass extended from about 15 cm from anus to about 8 cm from anal verge.  Biopsy was taken. Poor preparation of colon.  Colonoscopy was repeated on 12/16/2022, preparations inadequate.  Pathology showed invasive adenocarcinoma.   12/23/2022 Cancer Staging   Staging form: Colon and Rectum, AJCC 8th Edition - Clinical stage from 12/23/2022: Stage Unknown (cTX, cNX) - Signed by Babara Call, MD on 12/23/2022 Stage prefix: Initial diagnosis  Staging form: Colon and Rectum, AJCC 8th Edition - Clinical stage from 12/23/2022: Stage IVA (cT3, cN2, pM1a) - Signed by Babara Call, MD on 02/04/2023 Stage prefix: Initial diagnosis   12/23/2022 Tumor Marker   Patient's tumor was tested for the following markers: CEA. Results of the tumor marker test revealed elevated at 18.1.   01/03/2023 Imaging   CT chest abdomen with contrast showed 2.2 cm left upper lobe nodule, suspicious for metastasis. No findings suspicious for metastatic disease in the abdomen. Bilateral adrenal adenomas, benign. Cholelithiasis, without associated inflammatory changes. Aortic Atherosclerosis (ICD10-I70.0) and Emphysema   01/08/2023 Imaging   MRI pelvis without contrast-rectal protocol showed 9.2 cm concentric mid/lower rectal mass, corresponding to the patient's known primary rectal adenocarcinoma, as above. Rectal adenocarcinoma T stage: T3c Rectal adenocarcinoma N stage:  N2 Distance from tumor to the internal anal sphincter is 3.4 cm.   01/27/2023 Procedure   Patient underwent biopsy via bronchoscopy by pulmonology. Biopsy pathology showed adenocarcinoma, Immunohistochemistry is positive with cytokeratin 20 and CDX2 and negative with cytokeratin 7, TTF-1 and napsin A  consistent with colonic adenocarcinoma    01/27/2023 Pathology Results   CASE: [phone]   FINAL MICROSCOPIC DIAGNOSIS:  A. LUNG, LUL, FINE NEEDLE ASPIRATION:  Adenocarcinoma  See comment   ADDENDUM:  Immunohistochemistry is positive with cytokeratin 20 and CDX2  and negative with cytokeratin 7, TTF-1 and napsin A consistent with  colonic adenocarcinoma.    02/28/2023 Miscellaneous      03/03/2023 - 07/06/2023 Chemotherapy   Patient is on Treatment Plan : COLORECTAL FOLFIRI q14d     05/13/2023 Imaging   CT chest, abdomen, and pelvis with contrast IMPRESSION: 1. Response to therapy of rectal primary, pelvic nodal, and isolated pulmonary metastasis. 2. Moderate amount of stool upstream from the rectal primary. Partial obstruction cannot be excluded. 3. No new or progressive disease. 4. Right Port-A-Cath  with anterior SVC pericatheter small volume thrombus. 5. Age advanced coronary artery atherosclerosis. Recommend assessment of coronary risk factors. 6. Incidental findings, including: Cholelithiasis. Aortic Atherosclerosis (ICD10-I70.0).    Patient presents to reestablish care. She finished neoadjuvant FOLFIRI chemotherapy x 8  followed by concurrent chemotherapy radiation, finished on 09/23/2023.  CT CAP w contrast on 11/01/2023 showed rectal wall thickening but otherwise no evidence of residual diease  Patient declined rectal cancer surgery.  She was last seen by Dr. Lanny in May 2025.  She has moved back to live in [place] and prefers to switch her oncology care back to me.  She is overdue for oncology follow-up, labs and also CT surveillance scan.  Patient reports feeling well at baseline.  Denies any abdominal pain, nausea vomiting.  Denies rectal bleeding.  Occasionally she has loose bowel movement episodes.  Patient reports chronic arthralgia, back pain  Patient has had multiple ER visit and 1 hospitalization.  Left AMA.  MEDICAL HISTORY:  Past Medical History:   Diagnosis Date   (HFpEF) heart failure with preserved ejection fraction (HCC)    a. 05/2015 Echo: EF 60-65%, no rwma, PASP .   Acute pancreatitis 08/13/2018   Anxiety    Arthritis    Asthma    Bell's palsy    no deficit   Bronchitis    Cancer (HCC) 12/2022   rectal cancer   CHF (congestive heart failure) (HCC)    COPD (chronic obstructive pulmonary disease) (HCC)    Depression    Diabetes mellitus without complication (HCC)    type II 03/2017   Dyspnea    Fatty liver    per patient   Gall stones    per patient   Gastric ulcer    Hyperlipidemia    Hypertension    Lung nodule 12/2022   upper left   OSA (obstructive sleep apnea)    a. did not tolerate CPAP. On oxygen 2L via [place]   Pancreatitis    07/2018   Shoulder injury    6/19   Smoker     SURGICAL HISTORY: Past Surgical History:  Procedure Laterality Date   BRONCHIAL NEEDLE ASPIRATION BIOPSY  01/27/2023   Procedure: BRONCHIAL NEEDLE ASPIRATION BIOPSIES;  Surgeon: Isadora Hose, MD;  Location: [facility] ENDOSCOPY;  Service: Pulmonary;;   COLONOSCOPY WITH PROPOFOL  N/A 12/15/2022   Procedure: COLONOSCOPY WITH PROPOFOL ;  Surgeon: Therisa Bi, MD;  Location: [facility] ENDOSCOPY;  Service: Gastroenterology;  Laterality: N/A;   COLONOSCOPY WITH PROPOFOL  N/A 12/16/2022   Procedure: COLONOSCOPY WITH PROPOFOL ;  Surgeon: Therisa Bi, MD;  Location: [facility] ENDOSCOPY;  Service: Gastroenterology;  Laterality: N/A;   FLEXIBLE BRONCHOSCOPY N/A 06/20/2015   Procedure: FLEXIBLE BRONCHOSCOPY;  Surgeon: Laurance Flor, MD;  Location: [facility] ORS;  Service: Pulmonary;  Laterality: N/A;   IR EXCHANGE BILIARY DRAIN  01/19/2024   IR IMAGING GUIDED PORT INSERTION  02/25/2023   IR PERC CHOLECYSTOSTOMY  11/25/2023   IR REMOVAL BILIARY DRAIN  02/02/2024   KNEE SURGERY Left    8 knee surgeries    SOCIAL HISTORY: Social History   Socioeconomic History   Marital status: Widowed    Spouse name: Not on file   Number of children: Not on file   Years of  education: Not on file   Highest education level: Not on file  Occupational History   Occupation: unemployed  Tobacco Use   Smoking status: Every Day    Current packs/day: 3.00    Average packs/day: 3.0 packs/day for 46.0 years (138.0 ttl pk-yrs)  Types: Cigarettes   Smokeless tobacco: Never   Tobacco comments:    0.5PPD 01/13/2023    Started Chantix  last week  Vaping Use   Vaping status: Never Used  Substance and Sexual Activity   Alcohol use: Yes    Comment: 3 liquor drinks today   Drug use: No   Sexual activity: Yes    Birth control/protection: Post-menopausal  Other Topics Concern   Not on file  Social History Narrative   Not on file   Social Drivers of Health   Tobacco Use: High Risk (07/09/2024)   Patient History    Smoking Tobacco Use: Every Day    Smokeless Tobacco Use: Never    Passive Exposure: Not on file  Financial Resource Strain: High Risk (06/26/2024)   Received from [facility]   Overall Financial Resource Strain (CARDIA)    Difficulty of Paying Living Expenses: Very hard  Food Insecurity: Food Insecurity Present (06/26/2024)   Received from [facility]   Epic    Within the past 12 months, you worried that your food would run out before you got the money to buy more.: Often true    Within the past 12 months, the food you bought just didn't last and you didn't have money to get more.: Often true  Transportation Needs: No Transportation Needs (06/26/2024)   Received from [facility] - Transportation    In the past 12 months, has lack of transportation kept you from medical appointments or from getting medications?: No    Lack of Transportation (Non-Medical): No  Physical Activity: Not on file  Stress: Not on file  Social Connections: Unknown (05/31/2024)   Social Connection and Isolation Panel    Frequency of Communication with Friends and Family: Twice a week    Frequency of Social  Gatherings with Friends and Family: Twice a week    Attends Religious Services: Never    Database Administrator or Organizations: No    Attends Banker Meetings: Never    Marital Status: Not on file  Intimate Partner Violence: Not At Risk (05/31/2024)   Epic    Fear of Current or Ex-Partner: No    Emotionally Abused: No    Physically Abused: No    Sexually Abused: No  Depression (PHQ2-9): Low Risk (07/09/2024)   Depression (PHQ2-9)    PHQ-2 Score: 0  Alcohol Screen: Not on file  Housing: High Risk (06/26/2024)   Received from [facility]   Epic    In the last 12 months, was there a time when you were not able to pay the mortgage or rent on time?: No    In the past 12 months, how many times have you moved where you were living?: 3    At any time in the past 12 months, were you homeless or living in a shelter (including now)?: No  Utilities: Not At Risk (06/26/2024)   Received from [facility]   Epic    In the past 12 months has the electric, gas, oil, or water company threatened to shut off services in your home?: No  Recent Concern: Utilities - At Risk (05/31/2024)   Epic    Threatened with loss of utilities: Yes  Health Literacy: Not on file    FAMILY HISTORY: Family History  Problem Relation Age of Onset   Breast cancer Mother        early 77's  Lung cancer Father    Brain cancer Father    Bone cancer Father    Colon cancer Maternal Aunt    Cancer Maternal Uncle        liver?   Diabetes Maternal Grandmother    Asthma Other     ALLERGIES:  is allergic to tramadol and penicillins.  MEDICATIONS:  Current Outpatient Medications  Medication Sig Dispense Refill   Accu-Chek FastClix Lancets MISC Apply topically 2 (two) times daily.     ACCU-CHEK GUIDE TEST test strip 2 (two) times daily.     albuterol  (VENTOLIN  HFA) 108 (90 Base) MCG/ACT inhaler Inhale 2 puffs into the lungs every 6 (six) hours as needed for wheezing or  shortness of breath.      busPIRone  (BUSPAR ) 30 MG tablet Take 30 mg by mouth 2 (two) times daily.     clonazePAM  (KLONOPIN ) 0.5 MG tablet Take 0.5 mg by mouth 2 (two) times daily.     cyclobenzaprine  (FLEXERIL ) 5 MG tablet Take 1 tablet (5 mg total) by mouth 2 (two) times daily as needed for muscle spasms. 60 tablet 0   diltiazem  (CARDIZEM  CD) 120 MG 24 hr capsule TAKE ONE CAPSULE BY MOUTH EVERY DAY 60 capsule 0   diltiazem  (CARDIZEM ) 30 MG tablet TAKE 1 TABLET(30 MG) BY MOUTH THREE TIMES DAILY AS NEEDED 270 tablet 0   DULoxetine  (CYMBALTA ) 60 MG capsule Take 60 mg by mouth 2 (two) times daily.      Fluticasone -Salmeterol (ADVAIR) 250-50 MCG/DOSE AEPB Inhale 1 puff into the lungs 2 (two) times daily.     furosemide  (LASIX ) 40 MG tablet TAKE 1 TABLET BY MOUTH EVERY DAY. MAY TAKE ADDITIONAL TABLET IN THE AM FOR WEIGHT GAIN AS NEEDED 110 tablet 2   JARDIANCE 25 MG TABS tablet Take 25 mg by mouth daily.     metFORMIN  (GLUCOPHAGE -XR) 500 MG 24 hr tablet Take 1,000 mg by mouth 2 (two) times daily.     naloxone  (NARCAN ) nasal spray 4 mg/0.1 mL Place 1 spray into the nose once for 1 dose. 1 each 0   naloxone  (NARCAN ) nasal spray 4 mg/0.1 mL Place 1 spray into the nose once.     oxyCODONE  (ROXICODONE ) 15 MG immediate release tablet Take 1 tablet (15 mg total) by mouth every 12 (twelve) hours as needed for pain. 60 tablet 0   OXYGEN Inhale 2 L into the lungs at bedtime. Or as needed throughout the day if short of breath     pantoprazole  (PROTONIX ) 40 MG tablet Take 1 tablet (40 mg total) by mouth daily. 30 tablet 0   polyethylene glycol (MIRALAX  / GLYCOLAX ) 17 g packet Take 17 g by mouth daily as needed for mild constipation or moderate constipation. 30 each 0   potassium chloride  SA (KLOR-CON  M) 20 MEQ tablet Take 1 tablet (20 mEq total) by mouth 2 (two) times daily. 10 tablet 0   pregabalin  (LYRICA ) 100 MG capsule Take 1 capsule (100 mg total) by mouth 2 (two) times daily. 30 capsule 0   senna-docusate  (SENOKOT-S) 8.6-50 MG tablet Take 1 tablet by mouth 2 (two) times daily. For constipation 60 tablet 0   spironolactone  (ALDACTONE ) 25 MG tablet TAKE 1 TABLET(25 MG) BY MOUTH EVERY MORNING 90 tablet 3   No current facility-administered medications for this visit.    Review of Systems  Constitutional:  Negative for appetite change, chills, fatigue, fever and unexpected weight change.  HENT:   Negative for hearing loss and voice change.  Eyes:  Negative for eye problems.  Respiratory:  Negative for chest tightness, cough and hemoptysis.   Cardiovascular:  Negative for chest pain.  Gastrointestinal:  Negative for abdominal distention, abdominal pain and blood in stool.  Endocrine: Negative for hot flashes.  Genitourinary:  Negative for difficulty urinating and frequency.   Musculoskeletal:  Positive for arthralgias.  Skin:  Negative for itching and rash.  Neurological:  Positive for numbness. Negative for extremity weakness and light-headedness.  Hematological:  Negative for adenopathy.  Psychiatric/Behavioral:  Negative for confusion.      PHYSICAL EXAMINATION: ECOG PERFORMANCE STATUS: 1 - Symptomatic but completely ambulatory  Vitals:   07/09/24 1504  BP: (!) 149/80  Pulse: 88  Resp: 18  Temp: 98.3 F (36.8 C)  SpO2: 100%   Filed Weights   07/09/24 1504  Weight: 156 lb (70.8 kg)    Physical Exam Constitutional:      General: She is not in acute distress.    Appearance: She is not diaphoretic.  HENT:     Head: Normocephalic and atraumatic.     Nose: Nose normal.     Mouth/Throat:     Pharynx: No oropharyngeal exudate.  Eyes:     General: No scleral icterus.    Pupils: Pupils are equal, round, and reactive to light.  Cardiovascular:     Rate and Rhythm: Normal rate and regular rhythm.     Heart sounds: No murmur heard. Pulmonary:     Effort: Pulmonary effort is normal. No respiratory distress.     Comments: Decreased breath sound bilaterally. Abdominal:      General: There is no distension.     Palpations: Abdomen is soft.     Tenderness: There is no abdominal tenderness.  Musculoskeletal:        General: Normal range of motion.     Cervical back: Normal range of motion and neck supple.  Skin:    General: Skin is warm and dry.     Findings: No erythema.  Neurological:     Mental Status: She is alert and oriented to person, place, and time.     Cranial Nerves: No cranial nerve deficit.     Motor: No abnormal muscle tone.     Coordination: Coordination normal.  Psychiatric:        Mood and Affect: Affect normal.      LABORATORY DATA:  I have reviewed the data as listed    Latest Ref Rng & Units 07/09/2024    3:42 PM 06/24/2024    2:28 AM 06/04/2024    3:13 AM  CBC  WBC 4.0 - 10.5 K/uL 5.1  4.7  9.5   Hemoglobin 12.0 - 15.0 g/dL 87.9  89.1  87.8   Hematocrit 36.0 - 46.0 % 35.6  33.5  35.1   Platelets 150 - 400 K/uL 252  197  330       Latest Ref Rng & Units 07/09/2024    3:42 PM 06/24/2024    2:28 AM 06/04/2024    3:13 AM  CMP  Glucose 70 - 99 mg/dL 897  91  855   BUN 6 - 20 mg/dL 8  10  20    Creatinine 0.44 - 1.00 mg/dL 9.26  9.19  9.42   Sodium 135 - 145 mmol/L 138  139  138   Potassium 3.5 - 5.1 mmol/L 3.8  3.9  3.0   Chloride 98 - 111 mmol/L 101  102  99   CO2 22 - 32 mmol/L  25  24  30    Calcium  8.9 - 10.3 mg/dL 9.6  8.5  8.1   Total Protein 6.5 - 8.1 g/dL 7.5  6.3  5.6   Total Bilirubin 0.0 - 1.2 mg/dL 0.5  <9.7  0.4   Alkaline Phos 38 - 126 U/L 86  93  105   AST 15 - 41 U/L 15  14  24    ALT 0 - 44 U/L 7  8  28       RADIOGRAPHIC STUDIES: I have personally reviewed the radiological images as listed and agreed with the findings in the report. DG Chest Port 1 View Result Date: 06/24/2024 EXAM: 1 VIEW(S) XRAY OF THE CHEST 06/24/2024 03:53:00 AM COMPARISON: 05/31/2024 CLINICAL HISTORY: Questionable sepsis - evaluate for abnormality FINDINGS: LINES, TUBES AND DEVICES: Right chest port with tip in SVC. LUNGS AND PLEURA:  Improving left hilar opacity with superimposed linear atelectasis. Mild left basilar atelectasis. Follow-up chest radiograph is recommended in 3-4 weeks, following conservative therapy, to document resolution. If persistent at that time, dedicated contrast-enhanced CT imaging of the chest is recommended for further evaluation. No pleural effusion. No pneumothorax. HEART AND MEDIASTINUM: No acute abnormality of the cardiac and mediastinal silhouettes. BONES AND SOFT TISSUES: Remote right clavicle fracture. No acute osseous abnormality. IMPRESSION: 1. Improving left hilar opacity with superimposed linear atelectasis and mild left basilar atelectasis. Follow-up chest radiograph in 3-4 weeks is recommended to document resolution, with contrast-enhanced chest CT if findings persist. Electronically signed by: Dorethia Molt MD 06/24/2024 04:06 AM EST RP Workstation: [id number]   CT HEAD WO CONTRAST ( ) Result Date: 06/24/2024 EXAM: CT HEAD WITHOUT CONTRAST 06/24/2024 03:29:49 AM TECHNIQUE: CT of the head was performed without the administration of intravenous contrast. Automated exposure control, iterative reconstruction, and/or weight based adjustment of the mA/kV was utilized to reduce the radiation dose to as low as reasonably achievable. COMPARISON: CT Head May 31, 2024 CLINICAL HISTORY: Delirium FINDINGS: BRAIN AND VENTRICLES: No acute hemorrhage. No evidence of acute infarct. No hydrocephalus. No extra-axial collection. No mass effect or midline shift. ORBITS: No acute abnormality. SINUSES: No acute abnormality. SOFT TISSUES AND SKULL: No acute soft tissue abnormality. No skull fracture. IMPRESSION: 1. No acute intracranial abnormality. Electronically signed by: Gilmore Molt MD 06/24/2024 03:33 AM EST RP Workstation: [id number]    "

## 2024-07-09 NOTE — Assessment & Plan Note (Signed)
Smoke cessation was discussed with patient.

## 2024-07-10 ENCOUNTER — Telehealth: Payer: Self-pay | Admitting: Oncology

## 2024-07-10 LAB — DRUG SCREEN 764883 11+OXYCO+ALC+CRT-BUND
Amphetamines, Urine: NEGATIVE ng/mL
BENZODIAZ UR QL: NEGATIVE ng/mL
Barbiturate screen, urine: NEGATIVE ng/mL
Cannabinoid Quant, Ur: NEGATIVE ng/mL
Cocaine (Metab.): NEGATIVE ng/mL
Creatinine, Urine: 29.5 mg/dL (ref 20.0–300.0)
Ethanol U, Quan: NEGATIVE %
Meperidine: NEGATIVE ng/mL
Methadone Screen, Urine: NEGATIVE ng/mL
Nitrite Urine, Quantitative: NEGATIVE ug/mL
OPIATE SCREEN URINE: NEGATIVE ng/mL
Oxycodone/Oxymorphone, Urine: NEGATIVE ng/mL
Phencyclidine, Ur: NEGATIVE ng/mL
Propoxyphene, Urine: NEGATIVE ng/mL
Tramadol: NEGATIVE ng/mL
pH, Urine: 6.2 (ref 4.5–8.9)

## 2024-07-10 LAB — CEA: CEA: 4.6 ng/mL (ref 0.0–4.7)

## 2024-07-10 NOTE — Telephone Encounter (Signed)
 Called pt to sched CT - no answer and no vm set up - will call back - [facility]

## 2024-07-11 ENCOUNTER — Telehealth: Payer: Self-pay | Admitting: Oncology

## 2024-07-11 ENCOUNTER — Encounter: Payer: Self-pay | Admitting: Hematology

## 2024-07-11 ENCOUNTER — Telehealth: Payer: Self-pay

## 2024-07-11 NOTE — Telephone Encounter (Signed)
 Called to sched CT - no answer and no vm set up - [facility]

## 2024-07-11 NOTE — Telephone Encounter (Signed)
 Clinical Social Work received referral from Dr. Babara to assess patient's psychosocial needs.  Attempted to contact patient, but her voicemail was not set up.  CSW could not leave a message.  Will attempt at another time.

## 2024-07-13 ENCOUNTER — Other Ambulatory Visit: Payer: Self-pay | Admitting: Cardiovascular Disease

## 2024-07-16 ENCOUNTER — Telehealth: Payer: Self-pay | Admitting: Oncology

## 2024-07-16 ENCOUNTER — Other Ambulatory Visit: Payer: Self-pay | Admitting: Cardiovascular Disease

## 2024-07-16 ENCOUNTER — Encounter: Payer: Self-pay | Admitting: Oncology

## 2024-07-16 NOTE — Telephone Encounter (Signed)
 Called pt to sched CT - pt confirmed date/time/location - sent appt reminder via mychart - [facility]

## 2024-07-18 ENCOUNTER — Telehealth: Payer: Self-pay | Admitting: *Deleted

## 2024-07-18 NOTE — Telephone Encounter (Signed)
 Patient called triage to speak to Erica Mower, Erica [name] regarding her medications. Erica [name] refilled meds on 07/09/24. She stated that Erica [name] had decreased her flexeril  dosing from 10 mg three times a day to 5 mg twice daily. She does not understand why the dose decreased to 5 mg.  She also stated that the dose of Lyrica  was reduced from 150 mg to 100 mg. She was only given a 15 days supply of the Lyrica .  I explained to patient that Per Josh,Erica [name]'s last office note Patient reports that pain has been stable after reduced frequency of oxycodone  to twice daily and reduced dose of pregabalin . Will continue on this regimen and wean as tolerated. Will also reduce dose of cyclobenzaprine  to 5 mg twice daily. Erica [name] stated that she and Erica [name] did not discuss decreasing the dose of Pregabalin . She would like Erica [name] to call her to further discuss her new dosages. I explained to her that Erica is out of the office and will not be back until next week. I will send her message to Erica [name] and Sidra, Erica [name]. However, since Erica [name] made the adjustments to her medications, Erica [name] may want the patient to discuss this with Erica [name]. Erica [name] stated that she has enough medications to last until next week and would like Erica [name] to return her phone call next Monday.

## 2024-07-19 ENCOUNTER — Encounter: Payer: Self-pay | Admitting: Hematology

## 2024-07-20 ENCOUNTER — Ambulatory Visit: Admission: RE | Admit: 2024-07-20 | Payer: Self-pay | Source: Ambulatory Visit

## 2024-07-20 ENCOUNTER — Other Ambulatory Visit: Payer: Self-pay | Admitting: Cardiovascular Disease

## 2024-07-20 ENCOUNTER — Encounter: Payer: Self-pay | Admitting: Hematology

## 2024-07-23 ENCOUNTER — Encounter: Payer: Self-pay | Admitting: Hematology

## 2024-07-23 NOTE — Telephone Encounter (Signed)
 I returned patient's phone call. Offered telephone visit for her on Thursday. She prefers 3pm on Thurs 1/8. Msg sent to scheduling to arrange.

## 2024-07-24 ENCOUNTER — Ambulatory Visit: Admitting: Podiatry

## 2024-07-24 DIAGNOSIS — B351 Tinea unguium: Secondary | ICD-10-CM

## 2024-07-24 DIAGNOSIS — M79674 Pain in right toe(s): Secondary | ICD-10-CM | POA: Diagnosis not present

## 2024-07-24 NOTE — Progress Notes (Signed)
 "  Chief Complaint  Patient presents with   Nail Problem    Growth on toes of right foot. DM. A1c 5.8    HPI: 59 y.o. femalepresenting for nail dystrophy to the right foot times several years  Past Medical History:  Diagnosis Date   (HFpEF) heart failure with preserved ejection fraction (HCC)    a. 05/2015 Echo: EF 60-65%, no rwma, PASP .   Acute pancreatitis 08/13/2018   Anxiety    Arthritis    Asthma    Bell's palsy    no deficit   Bronchitis    Cancer (HCC) 12/2022   rectal cancer   CHF (congestive heart failure) (HCC)    COPD (chronic obstructive pulmonary disease) (HCC)    Depression    Diabetes mellitus without complication (HCC)    type II 03/2017   Dyspnea    Fatty liver    per patient   Gall stones    per patient   Gastric ulcer    Hyperlipidemia    Hypertension    Lung nodule 12/2022   upper left   OSA (obstructive sleep apnea)    a. did not tolerate CPAP. On oxygen 2L via [place]   Pancreatitis    07/2018   Shoulder injury    6/19   Smoker     Past Surgical History:  Procedure Laterality Date   BRONCHIAL NEEDLE ASPIRATION BIOPSY  01/27/2023   Procedure: BRONCHIAL NEEDLE ASPIRATION BIOPSIES;  Surgeon: Isadora Hose, MD;  Location: [facility] ENDOSCOPY;  Service: Pulmonary;;   COLONOSCOPY WITH PROPOFOL  N/A 12/15/2022   Procedure: COLONOSCOPY WITH PROPOFOL ;  Surgeon: Therisa Bi, MD;  Location: [facility] ENDOSCOPY;  Service: Gastroenterology;  Laterality: N/A;   COLONOSCOPY WITH PROPOFOL  N/A 12/16/2022   Procedure: COLONOSCOPY WITH PROPOFOL ;  Surgeon: Therisa Bi, MD;  Location: [facility] ENDOSCOPY;  Service: Gastroenterology;  Laterality: N/A;   FLEXIBLE BRONCHOSCOPY N/A 06/20/2015   Procedure: FLEXIBLE BRONCHOSCOPY;  Surgeon: Laurance Flor, MD;  Location: [facility] ORS;  Service: Pulmonary;  Laterality: N/A;   IR EXCHANGE BILIARY DRAIN  01/19/2024   IR IMAGING GUIDED PORT INSERTION  02/25/2023   IR PERC CHOLECYSTOSTOMY  11/25/2023   IR REMOVAL BILIARY DRAIN  02/02/2024    KNEE SURGERY Left    8 knee surgeries    Allergies[1]   Physical Exam: General: The patient is alert and oriented x3 in no acute distress.  Dermatology: Nail dystrophy noted specifically to the right foot digits 1-5 with hyperkeratotic callus formation around the great toe nailbed.  With debridement of the hyperkeratotic callus tissue there is some slight pinpoint bleeding possibly consistent with verruca lesion  Vascular: Palpable pedal pulses bilaterally.  Skin is warm to touch  Neurological: Grossly intact via light touch  Musculoskeletal Exam: No pedal deformities noted   Assessment/Plan of Care: 1.  Pain due to onychomycosis of toenails right 2.  Hyperkeratotic skin lesion to the distal aspect of the right great toe possibly consistent with verruca/wart  -Mechanical debridement of nails 1-5 right foot was performed today using a nail nipper. -Maintain good foot hygiene -Return to clinic 3 months to reevaluate the hyperkeratotic tissue as well as evaluation of the toenails     Thresa EMERSON Sar, DPM [facility]  Dr. Thresa EMERSON Sar, DPM    [address]  [place], [place] [address]                Office [phone]  Fax [phone]        [1]  Allergies Allergen Reactions   Tramadol Palpitations and Other (See Comments)    Heart Skipping Beats   Penicillins Rash   "

## 2024-07-26 ENCOUNTER — Telehealth: Payer: Self-pay | Admitting: Hospice and Palliative Medicine

## 2024-07-26 ENCOUNTER — Inpatient Hospital Stay: Payer: Self-pay | Attending: Oncology | Admitting: Hospice and Palliative Medicine

## 2024-07-26 DIAGNOSIS — Z515 Encounter for palliative care: Secondary | ICD-10-CM

## 2024-07-26 NOTE — Progress Notes (Signed)
 Patient did not answer.  Unable to leave voicemail.  Will reschedule and have patient come to clinic for visit.

## 2024-07-26 NOTE — Telephone Encounter (Signed)
 Per los 07/26/2024.  Please have patient come to clinic for visit next week        I called pt and no answer. No vmbx set up, unable to leave a msg.

## 2024-07-27 ENCOUNTER — Ambulatory Visit

## 2024-08-01 NOTE — Progress Notes (Unsigned)
 "  Cardiology Office Note    Date:  08/01/2024   ID:  MURDIS FLITTON, DOB 10-05-1965, MRN [id number]  PCP:  Tobie Domino, MD  Cardiologist:  Evalene Lunger, MD  Electrophysiologist:  None   Chief Complaint: ***  History of Present Illness:   CAEDYN TASSINARI is a 59 y.o. 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    Patient is followed by Dr. Gollan for cardiac issues.  She has a history of chronic diastolic CHF and intermittent chest pain in the past.  Myoview  10/2017 was low risk with no evidence of ischemia.  Echo 08/2018 with EF 55 to 60% and mild LVH with G1 DD, normal RV function, and no significant valvular disease.  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  She was initially treated with chemotherapy and radiation.  She had good response to chemoradiation with significant reduction in lung mass.  She however opted not to pursue surgery and has since been followed by palliative medicine.   Patient was most recently seen in the cardiology clinic 03/01/2023.  Doing well with no further testing or medication changes indicated at that time.  She was admitted 11/2023 for acute cholecystitis.  Cardiology was consulted for preoperative risk assessment.  She reported rare atypical chest pain that had been ongoing for years.  Updated echo was obtained and revealed EF 60 to 65% with no RWMA and indeterminate diastolic parameters with trivial MR.  She presented to the ED 06/24/2024 after her daughter called EMS with report of hypotension and orthostasis.  Patient was apparently intoxicated upon presentation to the emergency department with ethanol level of 169.  She was given IV fluids and started on antibiotics.  She was admitted for  hypotension but ultimately left AMA on the same day.  ***  Labs independently reviewed: ***  Objective   Past Medical History:  Diagnosis Date   (HFpEF) heart failure with preserved ejection fraction (HCC)    a. 05/2015 Echo: EF 60-65%, no rwma, PASP .   Acute pancreatitis 08/13/2018   Anxiety    Arthritis    Asthma    Bell's palsy    no deficit   Bronchitis    Cancer (HCC) 12/2022   rectal cancer   CHF (congestive heart failure) (HCC)    COPD (chronic obstructive pulmonary disease) (HCC)    Depression    Diabetes mellitus without complication (HCC)    type II 03/2017   Dyspnea    Fatty liver    per patient   Gall stones    per patient   Gastric ulcer    Hyperlipidemia    Hypertension    Lung nodule 12/2022   upper left   OSA (obstructive sleep apnea)    a. did not tolerate CPAP. On oxygen 2L via [place]   Pancreatitis    07/2018   Shoulder injury    6/19   Smoker     Current Medications: Active Medications[1]  Allergies:   Tramadol and Penicillins   Social History   Socioeconomic History   Marital status: Widowed    Spouse name: Not on file   Number of children: Not on file   Years of education: Not on file   Highest education level: Not on file  Occupational History  Occupation: unemployed  Tobacco Use   Smoking status: Every Day    Current packs/day: 3.00    Average packs/day: 3.0 packs/day for 46.0 years (138.0 ttl pk-yrs)    Types: Cigarettes   Smokeless tobacco: Never   Tobacco comments:    0.5PPD 01/13/2023    Started Chantix  last week  Vaping Use   Vaping status: Never Used  Substance and Sexual Activity   Alcohol use: Yes    Comment: 3 liquor drinks today   Drug use: No   Sexual activity: Yes    Birth control/protection: Post-menopausal  Other Topics Concern   Not on file  Social History Narrative   Not on file   Social Drivers of Health   Tobacco Use: High Risk (07/09/2024)   Patient History    Smoking Tobacco Use:  Every Day    Smokeless Tobacco Use: Never    Passive Exposure: Not on file  Financial Resource Strain: High Risk (06/26/2024)   Received from [facility]   Overall Financial Resource Strain (CARDIA)    Difficulty of Paying Living Expenses: Very hard  Food Insecurity: Food Insecurity Present (06/26/2024)   Received from [facility]   Epic    Within the past 12 months, you worried that your food would run out before you got the money to buy more.: Often true    Within the past 12 months, the food you bought just didn't last and you didn't have money to get more.: Often true  Transportation Needs: No Transportation Needs (06/26/2024)   Received from [facility]    In the past 12 months, has lack of transportation kept you from medical appointments or from getting medications?: No    Lack of Transportation (Non-Medical): No  Physical Activity: Not on file  Stress: Not on file  Social Connections: Unknown (05/31/2024)   Social Connection and Isolation Panel    Frequency of Communication with Friends and Family: Twice a week    Frequency of Social Gatherings with Friends and Family: Twice a week    Attends Religious Services: Never    Database Administrator or Organizations: No    Attends Banker Meetings: Never    Marital Status: Not on file  Depression (PHQ2-9): Low Risk (07/09/2024)   Depression (PHQ2-9)    PHQ-2 Score: 0  Alcohol Screen: Not on file  Housing: High Risk (06/26/2024)   Received from [facility]   Epic    In the last 12 months, was there a time when you were not able to pay the mortgage or rent on time?: No    In the past 12 months, how many times have you moved where you were living?: 3    At any time in the past 12 months, were you homeless or living in a shelter (including now)?: No  Utilities: Not At Risk (06/26/2024)   Received from [facility]   Epic    In the past 12 months has the electric, gas, oil, or water company threatened to shut off services in your home?: No  Recent Concern: Utilities - At Risk (05/31/2024)   Epic    Threatened with loss of utilities: Yes  Health Literacy: Not on file     Family History:  The patient's family history includes Asthma in an other family member; Bone cancer in her father; Brain cancer in her father; Breast cancer in her mother;  Cancer in her maternal uncle; Colon cancer in her maternal aunt; Diabetes in her maternal grandmother; Lung cancer in her father.  ROS:   12-point review of systems is negative unless otherwise noted in the HPI.  EKGs/Other Studies Reviewed:    Studies reviewed were summarized above. The additional studies were reviewed today:  11/2023 2D echo 1. Technically difficult study. Left ventricular ejection fraction, by  estimation, is 60 to 65%. The left ventricle has normal function. The left  ventricle has no regional wall motion abnormalities. Left ventricular  diastolic parameters are  indeterminate.   2. Right ventricular systolic function is normal. The right ventricular  size is normal. Tricuspid regurgitation signal is inadequate for assessing  PA pressure.   3. The mitral valve is normal in structure. Trivial mitral valve  regurgitation. No evidence of mitral stenosis.   4. The aortic valve was not well visualized. Aortic valve regurgitation  is not visualized. No aortic stenosis is present.   EKG:  EKG personally reviewed by me today    PHYSICAL EXAM:    VS:  LMP 03/13/2015 Comment: more than 2 years ago  BMI: There is no height or weight on file to calculate BMI.  GEN: Well nourished, well developed in no acute distress NECK: No JVD; No carotid bruits CARDIAC: ***RRR, no murmurs, rubs, gallops RESPIRATORY:  Clear to auscultation without rales, wheezing or rhonchi  ABDOMEN: Soft, non-tender, non-distended EXTREMITIES:  *** No edema; No  deformity  Wt Readings from Last 3 Encounters:  07/09/24 156 lb (70.8 kg)  07/09/24 156 lb (70.8 kg)  06/24/24 158 lb (71.7 kg)                  ASSESSMENT & PLAN:   Chronic HFpEF   Hypertension   Hyperlipidemia    {Are you ordering a CV Procedure (e.g. stress test, cath, DCCV, TEE, etc)?   Press F2        :[id number]}   Disposition: F/u with Dr. Gollan or an APP in ***.   Medication Adjustments/Labs and Tests Ordered: Current medicines are reviewed at length with the patient today.  Concerns regarding medicines are outlined above. Medication changes, Labs and Tests ordered today are summarized above and listed in the Patient Instructions accessible in Encounters.   Bonney Lesley Maffucci, PA-C 08/01/2024 3:46 PM     [facility] [place] [address] [place], [place] [address] [phone]      [1]  No outpatient medications have been marked as taking for the 08/02/24 encounter (Appointment) with Maffucci Lesley CROME, PA-C.   "

## 2024-08-02 ENCOUNTER — Ambulatory Visit: Admission: RE | Admit: 2024-08-02

## 2024-08-02 ENCOUNTER — Ambulatory Visit: Attending: Cardiology | Admitting: Physician Assistant

## 2024-08-03 ENCOUNTER — Encounter: Payer: Self-pay | Admitting: Oncology

## 2024-08-07 ENCOUNTER — Telehealth: Payer: Self-pay | Admitting: *Deleted

## 2024-08-07 ENCOUNTER — Other Ambulatory Visit: Payer: Self-pay | Admitting: Hospice and Palliative Medicine

## 2024-08-07 NOTE — Telephone Encounter (Signed)
 Patient is requesting medication RFs for oxycodone  and flexeril . Patient wants to do dose adjustments. Pt previously called about this concern on 07/18/24. Multiple attempts have been made to reach patient over the last few weeks by clinical staff. Patient does not have a vm box set up. RNs and scheduling team unable to leave vm for patient. She needs an In-Person apt with Josh for med mgmt.  I personally attempted to reach patient back via phone call number she left-[phone]. I was not able to leave any vm either. I will attempt to send her a mychart msg to explain the above. Her last log in to mychart was on 07/29/24

## 2024-08-08 ENCOUNTER — Encounter: Payer: Self-pay | Admitting: Hematology

## 2024-08-09 ENCOUNTER — Encounter: Payer: Self-pay | Admitting: Hospice and Palliative Medicine

## 2024-08-09 ENCOUNTER — Other Ambulatory Visit: Payer: Self-pay

## 2024-08-09 ENCOUNTER — Encounter: Payer: Self-pay | Admitting: Hematology

## 2024-08-09 ENCOUNTER — Inpatient Hospital Stay: Admitting: Hospice and Palliative Medicine

## 2024-08-09 DIAGNOSIS — C20 Malignant neoplasm of rectum: Secondary | ICD-10-CM

## 2024-08-09 DIAGNOSIS — G893 Neoplasm related pain (acute) (chronic): Secondary | ICD-10-CM | POA: Diagnosis not present

## 2024-08-09 DIAGNOSIS — Z515 Encounter for palliative care: Secondary | ICD-10-CM | POA: Diagnosis not present

## 2024-08-09 DIAGNOSIS — M792 Neuralgia and neuritis, unspecified: Secondary | ICD-10-CM | POA: Diagnosis not present

## 2024-08-09 DIAGNOSIS — C3482 Malignant neoplasm of overlapping sites of left bronchus and lung: Secondary | ICD-10-CM

## 2024-08-09 LAB — URINE DRUG SCREEN
Amphetamines: NEGATIVE
Barbiturates: NEGATIVE
Benzodiazepines: NEGATIVE
Cocaine: NEGATIVE
Fentanyl: NEGATIVE
Methadone Scn, Ur: NEGATIVE
Opiates: NEGATIVE
Tetrahydrocannabinol: NEGATIVE

## 2024-08-09 MED ORDER — PREGABALIN 100 MG PO CAPS: 100.0000 mg | ORAL_CAPSULE | Freq: Two times a day (BID) | ORAL | 0 refills | Status: AC

## 2024-08-09 MED ORDER — CYCLOBENZAPRINE HCL 5 MG PO TABS: 5.0000 mg | ORAL_TABLET | Freq: Two times a day (BID) | ORAL | 0 refills | Status: AC | PRN

## 2024-08-09 MED ORDER — OXYCODONE HCL 15 MG PO TABS: 15.0000 mg | ORAL_TABLET | Freq: Two times a day (BID) | ORAL | 0 refills | Status: AC | PRN

## 2024-08-09 NOTE — Progress Notes (Signed)
 "    Palliative Medicine [facility] [phone] [phone]   Name: Erica [name] Date: 08/09/2024 MRN: [id number]  DOB: [birth year]  Patient Care Team: Tobie Domino, MD as PCP - General (Family Medicine) Perla, Evalene PARAS, MD as PCP - Cardiology (Cardiology) Donette Ellouise DELENA, FNP as Nurse Practitioner (Family Medicine) Perla Evalene PARAS, MD as Consulting Physician (Cardiology) Linard Alm NOVAK, MD (Inactive) as Consulting Physician (Pulmonary Disease) Cindie Jesusa HERO, RN as Registered Nurse Dannielle Arlean FALCON, RN (Inactive) as Registered Nurse Maurie Rayfield BIRCH, RN as Oncology Nurse Navigator Lanny Callander, MD as Consulting Physician (Oncology) Pickenpack-Cousar, Fannie SAILOR, NP as Nurse Practitioner (Hospice and Palliative Medicine) Dewey Rush, MD as Consulting Physician (Radiation Oncology) Therisa Bi, MD as Consulting Physician (Gastroenterology) Isadora Hose, MD as Consulting Physician (Pulmonary Disease) Babara Call, MD as Consulting Physician (Oncology)    REASON FOR CONSULTATION: Erica [name] is a 59 y.o. 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  Palliative care was consulted to address goals of manage ongoing symptoms.  SOCIAL HISTORY:     reports that she has been smoking cigarettes. She has a 138 pack-year smoking history. She has never used smokeless tobacco. She reports current alcohol use. She reports that she does not use drugs.  Patient is twice divorced.  Lives in a camper on her friend's property.  Has a daughter in [place] and another daughter in [place], [place] .  Patient owned a bar for 25 years.  She is admittedly a recovering alcoholic.  Denies any other illicit drugs.  ADVANCE DIRECTIVES:   Not on file.  Patient reports that her friend is her HCPOA.  CODE STATUS: DNR/DNI (DNR order signed on 07/07/2024)  PAST MEDICAL HISTORY: Past Medical History:  Diagnosis Date   (HFpEF) heart failure with preserved ejection fraction (HCC)    a. 05/2015 Echo: EF 60-65%, no rwma, PASP .   Acute pancreatitis 08/13/2018   Anxiety    Arthritis    Asthma    Bell's palsy    no deficit   Bronchitis    Cancer (HCC) 12/2022   rectal cancer   CHF (congestive heart failure) (HCC)    COPD (chronic obstructive pulmonary disease) (HCC)    Depression    Diabetes mellitus without complication (HCC)    type II 03/2017   Dyspnea    Fatty liver    per patient   Gall stones    per patient   Gastric ulcer    Hyperlipidemia    Hypertension    Lung nodule 12/2022   upper left   OSA (obstructive sleep apnea)    a. did not tolerate CPAP. On oxygen 2L via [place]   Pancreatitis    07/2018   Shoulder injury    6/19   Smoker     PAST SURGICAL HISTORY:  Past Surgical History:  Procedure Laterality Date   BRONCHIAL NEEDLE ASPIRATION BIOPSY  01/27/2023   Procedure: BRONCHIAL NEEDLE ASPIRATION BIOPSIES;  Surgeon: Isadora Hose, MD;  Location: [facility] ENDOSCOPY;  Service: Pulmonary;;   COLONOSCOPY WITH PROPOFOL  N/A 12/15/2022   Procedure: COLONOSCOPY WITH PROPOFOL ;  Surgeon: Therisa Bi, MD;  Location: [facility] ENDOSCOPY;  Service: Gastroenterology;  Laterality: N/A;   COLONOSCOPY WITH PROPOFOL  N/A 12/16/2022   Procedure: COLONOSCOPY WITH PROPOFOL ;  Surgeon:  Therisa Bi, MD;  Location: [facility] ENDOSCOPY;  Service: Gastroenterology;  Laterality: N/A;   FLEXIBLE BRONCHOSCOPY N/A 06/20/2015   Procedure: FLEXIBLE BRONCHOSCOPY;  Surgeon: Laurance Flor, MD;  Location: [facility] ORS;  Service: Pulmonary;  Laterality: N/A;   IR EXCHANGE BILIARY DRAIN  01/19/2024   IR IMAGING GUIDED PORT INSERTION  02/25/2023   IR PERC CHOLECYSTOSTOMY  11/25/2023   IR REMOVAL BILIARY DRAIN  02/02/2024   KNEE SURGERY Left    8 knee  surgeries    HEMATOLOGY/ONCOLOGY HISTORY:  Oncology History Overview Note   Cancer Staging  Rectal cancer [facility]) Staging form: Colon and Rectum, AJCC 8th Edition - Clinical stage from 12/23/2022: Stage IVA (cT3, cN2, pM1a) - Signed by Babara Call, MD on 02/04/2023 Stage prefix: Initial diagnosis     Rectal cancer (HCC)  12/15/2022 Pathology Results   Colonoscopy:   Positive for invasive adenocarcinoma, G1 well differentiated      12/15/2022 Procedure   Colonoscopy under the care of Dr. Bi   Findings: -The perianal and digital rectal examinations were normal. -Extensive amounts of liquid semi- liquid semi- solid stool was found in the entire colon -A partially obstructing large mass was found in the rectum. The mass was circumferential.   12/23/2022 Initial Diagnosis   Rectal cancer [facility]) Patient was seen by gastroenterology due to hematochezia as well as unintentional weight loss.  12/15/2022, colonoscopy showed fungating partially obstructing large mass in the rectum, circumferential, mass extended from about 15 cm from anus to about 8 cm from anal verge.  Biopsy was taken. Poor preparation of colon.  Colonoscopy was repeated on 12/16/2022, preparations inadequate.  Pathology showed invasive adenocarcinoma.   12/23/2022 Cancer Staging   Staging form: Colon and Rectum, AJCC 8th Edition - Clinical stage from 12/23/2022: Stage Unknown (cTX, cNX) - Signed by Babara Call, MD on 12/23/2022 Stage prefix: Initial diagnosis  Staging form: Colon and Rectum, AJCC 8th Edition - Clinical stage from 12/23/2022: Stage IVA (cT3, cN2, pM1a) - Signed by Babara Call, MD on 02/04/2023 Stage prefix: Initial diagnosis   12/23/2022 Tumor Marker   Patient's tumor was tested for the following markers: CEA. Results of the tumor marker test revealed elevated at 18.1.   01/03/2023 Imaging   CT chest abdomen with contrast showed 2.2 cm left upper lobe nodule, suspicious for metastasis. No findings suspicious for  metastatic disease in the abdomen. Bilateral adrenal adenomas, benign. Cholelithiasis, without associated inflammatory changes. Aortic Atherosclerosis (ICD10-I70.0) and Emphysema   01/08/2023 Imaging   MRI pelvis without contrast-rectal protocol showed 9.2 cm concentric mid/lower rectal mass, corresponding to the patient's known primary rectal adenocarcinoma, as above. Rectal adenocarcinoma T stage: T3c Rectal adenocarcinoma N stage:  N2 Distance from tumor to the internal anal sphincter is 3.4 cm.   01/27/2023 Procedure   Patient underwent biopsy via bronchoscopy by pulmonology. Biopsy pathology showed adenocarcinoma, Immunohistochemistry is positive with cytokeratin 20 and CDX2 and negative with cytokeratin 7, TTF-1 and napsin A consistent with colonic adenocarcinoma    01/27/2023 Pathology Results   CASE: [phone]   FINAL MICROSCOPIC DIAGNOSIS:  A. LUNG, LUL, FINE NEEDLE ASPIRATION:  Adenocarcinoma  See comment   ADDENDUM:  Immunohistochemistry is positive with cytokeratin 20 and CDX2  and negative with cytokeratin 7, TTF-1 and napsin A consistent with  colonic adenocarcinoma.    02/28/2023 Miscellaneous      03/03/2023 - 07/06/2023 Chemotherapy   Patient is on Treatment Plan : COLORECTAL FOLFIRI q14d     05/13/2023 Imaging   CT chest, abdomen, and  pelvis with contrast IMPRESSION: 1. Response to therapy of rectal primary, pelvic nodal, and isolated pulmonary metastasis. 2. Moderate amount of stool upstream from the rectal primary. Partial obstruction cannot be excluded. 3. No new or progressive disease. 4. Right Port-A-Cath with anterior SVC pericatheter small volume thrombus. 5. Age advanced coronary artery atherosclerosis. Recommend assessment of coronary risk factors. 6. Incidental findings, including: Cholelithiasis. Aortic Atherosclerosis (ICD10-I70.0).     ALLERGIES:  is allergic to tramadol and penicillins.  MEDICATIONS:  Current Outpatient Medications   Medication Sig Dispense Refill   Accu-Chek FastClix Lancets MISC Apply topically 2 (two) times daily.     ACCU-CHEK GUIDE TEST test strip 2 (two) times daily.     albuterol  (VENTOLIN  HFA) 108 (90 Base) MCG/ACT inhaler Inhale 2 puffs into the lungs every 6 (six) hours as needed for wheezing or shortness of breath.      busPIRone  (BUSPAR ) 30 MG tablet Take 30 mg by mouth 2 (two) times daily.     clonazePAM  (KLONOPIN ) 0.5 MG tablet Take 0.5 mg by mouth 2 (two) times daily.     cyclobenzaprine  (FLEXERIL ) 5 MG tablet Take 1 tablet (5 mg total) by mouth 2 (two) times daily as needed for muscle spasms. 60 tablet 0   diltiazem  (CARDIZEM  CD) 120 MG 24 hr capsule TAKE ONE CAPSULE BY MOUTH EVERY DAY 60 capsule 0   diltiazem  (CARDIZEM ) 30 MG tablet TAKE 1 TABLET(30 MG) BY MOUTH THREE TIMES DAILY AS NEEDED 45 tablet 0   DULoxetine  (CYMBALTA ) 60 MG capsule Take 60 mg by mouth 2 (two) times daily.      Fluticasone -Salmeterol (ADVAIR) 250-50 MCG/DOSE AEPB Inhale 1 puff into the lungs 2 (two) times daily.     furosemide  (LASIX ) 40 MG tablet TAKE 1 TABLET BY MOUTH EVERY DAY. MAY TAKE ADDITIONAL TABLET IN THE AM FOR WEIGHT GAIN AS NEEDED 110 tablet 2   JARDIANCE 25 MG TABS tablet Take 25 mg by mouth daily.     metFORMIN  (GLUCOPHAGE -XR) 500 MG 24 hr tablet Take 1,000 mg by mouth 2 (two) times daily.     naloxone  (NARCAN ) nasal spray 4 mg/0.1 mL Place 1 spray into the nose once.     oxyCODONE  (ROXICODONE ) 15 MG immediate release tablet Take 1 tablet (15 mg total) by mouth every 12 (twelve) hours as needed for pain. 60 tablet 0   OXYGEN Inhale 2 L into the lungs at bedtime. Or as needed throughout the day if short of breath     pantoprazole  (PROTONIX ) 40 MG tablet Take 1 tablet (40 mg total) by mouth daily. 30 tablet 0   polyethylene glycol (MIRALAX  / GLYCOLAX ) 17 g packet Take 17 g by mouth daily as needed for mild constipation or moderate constipation. 30 each 0   potassium chloride  SA (KLOR-CON  M) 20 MEQ tablet  Take 1 tablet (20 mEq total) by mouth 2 (two) times daily. 10 tablet 0   pregabalin  (LYRICA ) 100 MG capsule Take 1 capsule (100 mg total) by mouth 2 (two) times daily. 30 capsule 0   rosuvastatin (CRESTOR) 10 MG tablet Take 10 mg by mouth daily.     senna-docusate (SENOKOT-S) 8.6-50 MG tablet Take 1 tablet by mouth 2 (two) times daily. For constipation 60 tablet 0   spironolactone  (ALDACTONE ) 25 MG tablet TAKE 1 TABLET(25 MG) BY MOUTH EVERY MORNING 30 tablet 0   traZODone (DESYREL) 150 MG tablet Take 150 mg by mouth at bedtime. (Patient not taking: Reported on 08/09/2024)     No current facility-administered  medications for this visit.    VITAL SIGNS: LMP 03/13/2015 Comment: more than 2 years ago There were no vitals filed for this visit.   Estimated body mass index is 23.72 kg/m as calculated from the following:   Height as of 06/24/24: 5' 8 (1.727 m).   Weight as of 07/09/24: 156 lb (70.8 kg).  LABS: CBC:    Component Value Date/Time   WBC 5.1 07/09/2024 1542   WBC 4.7 06/24/2024 0228   HGB 12.0 07/09/2024 1542   HGB 13.9 12/09/2022 1515   HCT 35.6 (L) 07/09/2024 1542   HCT 41.4 12/09/2022 1515   PLT 252 07/09/2024 1542   PLT 288 12/09/2022 1515   MCV 92.0 07/09/2024 1542   MCV 91 12/09/2022 1515   MCV 102 (H) 02/26/2014 1826   NEUTROABS 3.3 07/09/2024 1542   NEUTROABS 5.6 12/09/2022 1515   LYMPHSABS 1.0 07/09/2024 1542   LYMPHSABS 2.4 12/09/2022 1515   MONOABS 0.6 07/09/2024 1542   EOSABS 0.2 07/09/2024 1542   EOSABS 0.5 (H) 12/09/2022 1515   BASOSABS 0.1 07/09/2024 1542   BASOSABS 0.1 12/09/2022 1515   Comprehensive Metabolic Panel:    Component Value Date/Time   NA 138 07/09/2024 1542   NA 140 04/22/2016 1532   NA 139 02/26/2014 1826   K 3.8 07/09/2024 1542   K 3.7 02/26/2014 1826   CL 101 07/09/2024 1542   CL 104 02/26/2014 1826   CO2 25 07/09/2024 1542   CO2 27 02/26/2014 1826   BUN 8 07/09/2024 1542   BUN 10 04/22/2016 1532   BUN 4 (L) 02/26/2014 1826    CREATININE 0.73 07/09/2024 1542   CREATININE 0.74 02/26/2014 1826   GLUCOSE 102 (H) 07/09/2024 1542   GLUCOSE 132 (H) 02/26/2014 1826   CALCIUM  9.6 07/09/2024 1542   CALCIUM  8.3 (L) 02/26/2014 1826   AST 15 07/09/2024 1542   ALT 7 07/09/2024 1542   ALT 57 03/10/2013 0100   ALKPHOS 86 07/09/2024 1542   ALKPHOS 89 03/10/2013 0100   BILITOT 0.5 07/09/2024 1542   PROT 7.5 07/09/2024 1542   PROT 7.6 03/10/2013 0100   ALBUMIN 4.2 07/09/2024 1542   ALBUMIN 3.6 03/10/2013 0100    RADIOGRAPHIC STUDIES: No results found.   PERFORMANCE STATUS (ECOG) : 1 - Symptomatic but completely ambulatory  Review of Systems Unless otherwise noted, a complete review of systems is negative.  Physical Exam General: NAD Cardiovascular: regular rate and rhythm Pulmonary: clear ant fields Abdomen: soft, nontender, + bowel sounds GU: no suprapubic tenderness Extremities: no edema, no joint deformities Skin: no rashes Neurological: Weakness but otherwise nonfocal  IMPRESSION: Patient with stage IV colon cancer status post chemoradiation.  Patient opted not to pursue surgery as she did not want a colostomy bag.  High risk for eventual recurrence.  Plan is for surveillance.  When previously seen, plan was for short interval follow-up.  However, patient has been difficult to reach by phone.  She presents to clinic today for refill of pain medications.  Patient states she is doing reasonably well.  Denies any significant changes or concerns.  Denies any misuse of pain medications, illicit drugs, or alcohol use.  States that she is tolerating medications well.  Patient took last dose of oxycodone  and Flexeril  last night.  Says that she ran out of her pregabalin  2 weeks ago and has noted increased nerve pain.  Patient also says that her psychiatry PA rotated from Ambien to trazodone and that has caused worse insomnia.  Of  note, I have discussed her case with her psychiatry PA including concerns for  recent hospitalizations due to polypharmacy and etoh use.   PLAN: - Patient establishing care with Dr. Babara - Refill oxycodone  15 mg every 12 hours #60 - Refill cyclobenzaprine  to 5 mg twice daily - Refill pregabalin  - Daily bowel regimen - UDS today - PDMP reviewed - Follow-up 2 weeks   Patient expressed understanding and was in agreement with this plan. She also understands that She can call the clinic at any time with any questions, concerns, or complaints.     Time Total: 15 minutes  Visit consisted of counseling and education dealing with the complex and emotionally intense issues of symptom management and palliative care in the setting of serious and potentially life-threatening illness.Greater than 50%  of this time was spent counseling and coordinating care related to the above assessment and plan.  Signed by: Fonda Mower, PhD, NP-C   "

## 2024-08-09 NOTE — Telephone Encounter (Signed)
 Patient called to request for rf on narcotic. She stated that she it out and she is upset that RFs have not been called in yet. I discussed that we have been trying to reach her on multiple occassions and even sent a mycahrt msg to reach her. I made patient aware that she needs an in person visit in the clinic before meds are filled. She would like to come today at 130 pm today to see Josh to get her meds RF.  RN spoke with Josh. Ok to add pt today at 130. Pt aware and agreeable to plan of care.

## 2024-08-16 ENCOUNTER — Telehealth: Payer: Self-pay | Admitting: Oncology

## 2024-08-16 NOTE — Telephone Encounter (Signed)
 Called pt to see if she wants to r/s due to weather - no answer and no vm set up - will try again tomorrow - [facility]

## 2024-08-17 ENCOUNTER — Encounter: Payer: Self-pay | Admitting: Oncology

## 2024-08-20 ENCOUNTER — Inpatient Hospital Stay

## 2024-08-20 ENCOUNTER — Ambulatory Visit: Admission: RE | Admit: 2024-08-20 | Source: Ambulatory Visit

## 2024-08-20 ENCOUNTER — Inpatient Hospital Stay: Payer: Self-pay

## 2024-08-23 ENCOUNTER — Inpatient Hospital Stay: Admitting: Hospice and Palliative Medicine

## 2024-08-27 ENCOUNTER — Inpatient Hospital Stay: Attending: Oncology | Admitting: Hospice and Palliative Medicine

## 2024-08-30 ENCOUNTER — Ambulatory Visit

## 2024-08-30 ENCOUNTER — Inpatient Hospital Stay

## 2024-10-01 ENCOUNTER — Inpatient Hospital Stay

## 2024-10-08 ENCOUNTER — Inpatient Hospital Stay

## 2024-10-08 ENCOUNTER — Inpatient Hospital Stay: Admitting: Oncology

## 2024-10-23 ENCOUNTER — Ambulatory Visit: Admitting: Podiatry

## 2024-11-12 ENCOUNTER — Inpatient Hospital Stay

## 2024-12-24 ENCOUNTER — Inpatient Hospital Stay

## 2025-02-04 ENCOUNTER — Inpatient Hospital Stay

## 2025-03-18 ENCOUNTER — Inpatient Hospital Stay
